# Patient Record
Sex: Male | Born: 1935
Health system: Southern US, Community
[De-identification: ages and names within clinical notes are randomized; demographics above are authoritative.]

## PROBLEM LIST (undated history)

## (undated) DIAGNOSIS — I10 Essential (primary) hypertension: Secondary | ICD-10-CM

## (undated) DIAGNOSIS — H9190 Unspecified hearing loss, unspecified ear: Secondary | ICD-10-CM

## (undated) DIAGNOSIS — H269 Unspecified cataract: Secondary | ICD-10-CM

## (undated) DIAGNOSIS — K8020 Calculus of gallbladder without cholecystitis without obstruction: Secondary | ICD-10-CM

## (undated) DIAGNOSIS — J329 Chronic sinusitis, unspecified: Secondary | ICD-10-CM

## (undated) DIAGNOSIS — J449 Chronic obstructive pulmonary disease, unspecified: Secondary | ICD-10-CM

## (undated) DIAGNOSIS — N400 Enlarged prostate without lower urinary tract symptoms: Secondary | ICD-10-CM

## (undated) DIAGNOSIS — N183 Chronic kidney disease, stage 3 unspecified: Secondary | ICD-10-CM

## (undated) DIAGNOSIS — J45909 Unspecified asthma, uncomplicated: Secondary | ICD-10-CM

## (undated) DIAGNOSIS — H3530 Unspecified macular degeneration: Secondary | ICD-10-CM

## (undated) DIAGNOSIS — E7800 Pure hypercholesterolemia, unspecified: Secondary | ICD-10-CM

## (undated) DIAGNOSIS — I2510 Atherosclerotic heart disease of native coronary artery without angina pectoris: Secondary | ICD-10-CM

## (undated) HISTORY — PX: TRANSURETHRAL RESECTION OF PROSTATE: SHX73

## (undated) HISTORY — DX: Calculus of gallbladder without cholecystitis without obstruction: K80.20

## (undated) HISTORY — DX: Chronic kidney disease, stage 3 unspecified: N18.30

## (undated) HISTORY — DX: Unspecified asthma, uncomplicated: J45.909

## (undated) HISTORY — PX: APPENDECTOMY: SHX54

## (undated) HISTORY — DX: Chronic kidney disease, stage 3 (moderate): N18.3

## (undated) HISTORY — DX: Benign prostatic hyperplasia without lower urinary tract symptoms: N40.0

## (undated) HISTORY — DX: Unspecified macular degeneration: H35.30

## (undated) HISTORY — DX: Unspecified hearing loss, unspecified ear: H91.90

## (undated) HISTORY — DX: Unspecified cataract: H26.9

## (undated) HISTORY — DX: Chronic sinusitis, unspecified: J32.9

---

## 1996-08-13 HISTORY — PX: CORONARY ANGIOPLASTY WITH STENT PLACEMENT: SHX49

## 2000-02-26 ENCOUNTER — Encounter: Payer: Self-pay | Admitting: Emergency Medicine

## 2000-02-27 ENCOUNTER — Inpatient Hospital Stay (HOSPITAL_COMMUNITY): Admission: EM | Admit: 2000-02-27 | Discharge: 2000-02-29 | Payer: Self-pay | Admitting: Emergency Medicine

## 2001-01-08 ENCOUNTER — Inpatient Hospital Stay (HOSPITAL_COMMUNITY): Admission: EM | Admit: 2001-01-08 | Discharge: 2001-01-10 | Payer: Self-pay | Admitting: Emergency Medicine

## 2001-01-08 ENCOUNTER — Encounter: Payer: Self-pay | Admitting: Emergency Medicine

## 2001-04-14 ENCOUNTER — Emergency Department (HOSPITAL_COMMUNITY): Admission: EM | Admit: 2001-04-14 | Discharge: 2001-04-14 | Payer: Self-pay | Admitting: Emergency Medicine

## 2001-04-14 ENCOUNTER — Encounter: Payer: Self-pay | Admitting: Emergency Medicine

## 2001-06-01 ENCOUNTER — Inpatient Hospital Stay (HOSPITAL_COMMUNITY): Admission: EM | Admit: 2001-06-01 | Discharge: 2001-06-10 | Payer: Self-pay

## 2001-06-01 ENCOUNTER — Encounter: Payer: Self-pay | Admitting: *Deleted

## 2001-06-02 ENCOUNTER — Encounter: Payer: Self-pay | Admitting: Pulmonary Disease

## 2001-06-03 ENCOUNTER — Encounter: Payer: Self-pay | Admitting: Pulmonary Disease

## 2001-06-04 ENCOUNTER — Encounter: Payer: Self-pay | Admitting: Pulmonary Disease

## 2001-06-05 ENCOUNTER — Encounter: Payer: Self-pay | Admitting: Pulmonary Disease

## 2001-06-05 ENCOUNTER — Encounter: Payer: Self-pay | Admitting: Internal Medicine

## 2001-06-06 ENCOUNTER — Encounter: Payer: Self-pay | Admitting: Internal Medicine

## 2001-06-07 ENCOUNTER — Encounter: Payer: Self-pay | Admitting: Internal Medicine

## 2001-06-08 ENCOUNTER — Encounter: Payer: Self-pay | Admitting: Internal Medicine

## 2001-06-09 ENCOUNTER — Encounter: Payer: Self-pay | Admitting: Gastroenterology

## 2002-07-23 ENCOUNTER — Encounter: Payer: Self-pay | Admitting: Emergency Medicine

## 2002-07-23 ENCOUNTER — Inpatient Hospital Stay (HOSPITAL_COMMUNITY): Admission: EM | Admit: 2002-07-23 | Discharge: 2002-07-25 | Payer: Self-pay

## 2003-07-10 ENCOUNTER — Emergency Department (HOSPITAL_COMMUNITY): Admission: EM | Admit: 2003-07-10 | Discharge: 2003-07-10 | Payer: Self-pay | Admitting: *Deleted

## 2003-11-02 ENCOUNTER — Other Ambulatory Visit: Payer: Self-pay

## 2007-09-25 ENCOUNTER — Other Ambulatory Visit: Payer: Self-pay

## 2007-09-25 ENCOUNTER — Emergency Department: Payer: Self-pay | Admitting: Emergency Medicine

## 2011-10-09 ENCOUNTER — Encounter (INDEPENDENT_AMBULATORY_CARE_PROVIDER_SITE_OTHER): Payer: Self-pay | Admitting: Ophthalmology

## 2011-11-23 ENCOUNTER — Encounter (INDEPENDENT_AMBULATORY_CARE_PROVIDER_SITE_OTHER): Payer: PRIVATE HEALTH INSURANCE | Admitting: Ophthalmology

## 2011-11-23 DIAGNOSIS — H3530 Unspecified macular degeneration: Secondary | ICD-10-CM

## 2011-11-23 DIAGNOSIS — H35039 Hypertensive retinopathy, unspecified eye: Secondary | ICD-10-CM

## 2011-11-23 DIAGNOSIS — H2510 Age-related nuclear cataract, unspecified eye: Secondary | ICD-10-CM

## 2011-11-23 DIAGNOSIS — I10 Essential (primary) hypertension: Secondary | ICD-10-CM

## 2011-11-23 DIAGNOSIS — H43819 Vitreous degeneration, unspecified eye: Secondary | ICD-10-CM

## 2011-11-23 DIAGNOSIS — H26499 Other secondary cataract, unspecified eye: Secondary | ICD-10-CM

## 2011-11-26 ENCOUNTER — Encounter (INDEPENDENT_AMBULATORY_CARE_PROVIDER_SITE_OTHER): Payer: PRIVATE HEALTH INSURANCE | Admitting: Ophthalmology

## 2011-11-26 DIAGNOSIS — H3530 Unspecified macular degeneration: Secondary | ICD-10-CM

## 2011-11-26 DIAGNOSIS — H353290 Exudative age-related macular degeneration, unspecified eye, stage unspecified: Secondary | ICD-10-CM

## 2011-11-26 DIAGNOSIS — H43819 Vitreous degeneration, unspecified eye: Secondary | ICD-10-CM

## 2011-11-29 ENCOUNTER — Encounter (INDEPENDENT_AMBULATORY_CARE_PROVIDER_SITE_OTHER): Payer: PRIVATE HEALTH INSURANCE | Admitting: Ophthalmology

## 2011-11-29 DIAGNOSIS — I10 Essential (primary) hypertension: Secondary | ICD-10-CM

## 2011-11-29 DIAGNOSIS — H35039 Hypertensive retinopathy, unspecified eye: Secondary | ICD-10-CM

## 2011-11-29 DIAGNOSIS — H43819 Vitreous degeneration, unspecified eye: Secondary | ICD-10-CM

## 2011-11-29 DIAGNOSIS — H3530 Unspecified macular degeneration: Secondary | ICD-10-CM

## 2011-11-29 DIAGNOSIS — H353290 Exudative age-related macular degeneration, unspecified eye, stage unspecified: Secondary | ICD-10-CM

## 2011-12-24 ENCOUNTER — Encounter (INDEPENDENT_AMBULATORY_CARE_PROVIDER_SITE_OTHER): Payer: PRIVATE HEALTH INSURANCE | Admitting: Ophthalmology

## 2011-12-24 DIAGNOSIS — H2510 Age-related nuclear cataract, unspecified eye: Secondary | ICD-10-CM

## 2011-12-24 DIAGNOSIS — H353290 Exudative age-related macular degeneration, unspecified eye, stage unspecified: Secondary | ICD-10-CM

## 2011-12-24 DIAGNOSIS — H3530 Unspecified macular degeneration: Secondary | ICD-10-CM

## 2011-12-24 DIAGNOSIS — H43819 Vitreous degeneration, unspecified eye: Secondary | ICD-10-CM

## 2011-12-24 DIAGNOSIS — I10 Essential (primary) hypertension: Secondary | ICD-10-CM

## 2011-12-24 DIAGNOSIS — H35039 Hypertensive retinopathy, unspecified eye: Secondary | ICD-10-CM

## 2012-01-21 ENCOUNTER — Encounter (INDEPENDENT_AMBULATORY_CARE_PROVIDER_SITE_OTHER): Payer: PRIVATE HEALTH INSURANCE | Admitting: Ophthalmology

## 2012-01-21 DIAGNOSIS — H35039 Hypertensive retinopathy, unspecified eye: Secondary | ICD-10-CM

## 2012-01-21 DIAGNOSIS — H43819 Vitreous degeneration, unspecified eye: Secondary | ICD-10-CM

## 2012-01-21 DIAGNOSIS — H3530 Unspecified macular degeneration: Secondary | ICD-10-CM

## 2012-01-21 DIAGNOSIS — H2510 Age-related nuclear cataract, unspecified eye: Secondary | ICD-10-CM

## 2012-01-21 DIAGNOSIS — H353290 Exudative age-related macular degeneration, unspecified eye, stage unspecified: Secondary | ICD-10-CM

## 2012-01-21 DIAGNOSIS — I10 Essential (primary) hypertension: Secondary | ICD-10-CM

## 2012-02-25 ENCOUNTER — Encounter (INDEPENDENT_AMBULATORY_CARE_PROVIDER_SITE_OTHER): Payer: PRIVATE HEALTH INSURANCE | Admitting: Ophthalmology

## 2012-02-25 DIAGNOSIS — I10 Essential (primary) hypertension: Secondary | ICD-10-CM

## 2012-02-25 DIAGNOSIS — H43819 Vitreous degeneration, unspecified eye: Secondary | ICD-10-CM

## 2012-02-25 DIAGNOSIS — H2510 Age-related nuclear cataract, unspecified eye: Secondary | ICD-10-CM

## 2012-02-25 DIAGNOSIS — H3530 Unspecified macular degeneration: Secondary | ICD-10-CM

## 2012-02-25 DIAGNOSIS — H35039 Hypertensive retinopathy, unspecified eye: Secondary | ICD-10-CM

## 2012-02-25 DIAGNOSIS — H353290 Exudative age-related macular degeneration, unspecified eye, stage unspecified: Secondary | ICD-10-CM

## 2012-03-08 ENCOUNTER — Encounter (HOSPITAL_COMMUNITY): Payer: Self-pay | Admitting: *Deleted

## 2012-03-08 ENCOUNTER — Emergency Department (HOSPITAL_COMMUNITY): Payer: PRIVATE HEALTH INSURANCE

## 2012-03-08 ENCOUNTER — Emergency Department (HOSPITAL_COMMUNITY)
Admission: EM | Admit: 2012-03-08 | Discharge: 2012-03-08 | Disposition: A | Discharge status: Home / Self Care | Payer: PRIVATE HEALTH INSURANCE | Attending: Emergency Medicine | Admitting: Emergency Medicine

## 2012-03-08 DIAGNOSIS — I2510 Atherosclerotic heart disease of native coronary artery without angina pectoris: Secondary | ICD-10-CM | POA: Insufficient documentation

## 2012-03-08 DIAGNOSIS — R739 Hyperglycemia, unspecified: Secondary | ICD-10-CM

## 2012-03-08 DIAGNOSIS — J4489 Other specified chronic obstructive pulmonary disease: Secondary | ICD-10-CM | POA: Insufficient documentation

## 2012-03-08 DIAGNOSIS — I10 Essential (primary) hypertension: Secondary | ICD-10-CM | POA: Insufficient documentation

## 2012-03-08 DIAGNOSIS — D72829 Elevated white blood cell count, unspecified: Secondary | ICD-10-CM | POA: Insufficient documentation

## 2012-03-08 DIAGNOSIS — R7309 Other abnormal glucose: Secondary | ICD-10-CM | POA: Insufficient documentation

## 2012-03-08 DIAGNOSIS — R109 Unspecified abdominal pain: Secondary | ICD-10-CM

## 2012-03-08 DIAGNOSIS — J449 Chronic obstructive pulmonary disease, unspecified: Secondary | ICD-10-CM | POA: Insufficient documentation

## 2012-03-08 HISTORY — DX: Essential (primary) hypertension: I10

## 2012-03-08 HISTORY — DX: Atherosclerotic heart disease of native coronary artery without angina pectoris: I25.10

## 2012-03-08 HISTORY — DX: Chronic obstructive pulmonary disease, unspecified: J44.9

## 2012-03-08 HISTORY — DX: Pure hypercholesterolemia, unspecified: E78.00

## 2012-03-08 LAB — BASIC METABOLIC PANEL
Calcium: 9.6 mg/dL (ref 8.4–10.5)
GFR calc Af Amer: >90 mL/min (ref >90)
GFR calc non Af Amer: 80 mL/min — ABNORMAL LOW (ref >90)
Glucose, Bld: 149 mg/dL — ABNORMAL HIGH (ref 70–99)
Potassium: 3.6 mEq/L (ref 3.5–5.1)
Sodium: 136 mEq/L (ref 135–145)

## 2012-03-08 LAB — CBC
Hemoglobin: 12.1 g/dL — ABNORMAL LOW (ref 13.0–17.0)
MCH: 29.1 pg (ref 26.0–34.0)
MCHC: 33.7 g/dL (ref 30.0–36.0)
Platelets: 197 10*3/uL (ref 150–400)

## 2012-03-08 LAB — URINALYSIS, ROUTINE W REFLEX MICROSCOPIC
Bilirubin Urine: NEGATIVE
Ketones, ur: NEGATIVE mg/dL
Leukocytes, UA: NEGATIVE
Nitrite: NEGATIVE
Protein, ur: NEGATIVE mg/dL
Urobilinogen, UA: 0.2 mg/dL (ref 0.0–1.0)
pH: 5.5 (ref 5.0–8.0)

## 2012-03-08 LAB — POCT I-STAT TROPONIN I: Troponin i, poc: 0.03 ng/mL (ref 0.00–0.08)

## 2012-03-08 LAB — HEPATIC FUNCTION PANEL
AST: 23 U/L (ref 0–37)
Bilirubin, Direct: 0.1 mg/dL (ref 0.0–0.3)
Indirect Bilirubin: 0.2 mg/dL — ABNORMAL LOW (ref 0.3–0.9)
Total Bilirubin: 0.3 mg/dL (ref 0.3–1.2)

## 2012-03-08 MED ORDER — ONDANSETRON HCL 4 MG/2ML IJ SOLN
4.0000 mg | Freq: Once | INTRAMUSCULAR | Status: AC
  Administered 2012-03-08: 4 mg via INTRAVENOUS
  Filled 2012-03-08: qty 2

## 2012-03-08 MED ORDER — PANTOPRAZOLE SODIUM 20 MG PO TBEC: 20.0000 mg | DELAYED_RELEASE_TABLET | Freq: Every day | ORAL | Status: DC

## 2012-03-08 MED ORDER — OXYCODONE-ACETAMINOPHEN 5-325 MG PO TABS: 2.0000 | ORAL_TABLET | ORAL | Status: AC | PRN

## 2012-03-08 MED ORDER — NITROGLYCERIN IN D5W 200-5 MCG/ML-% IV SOLN: 5.0000 ug/min | INTRAVENOUS | Status: DC

## 2012-03-08 MED ORDER — NITROGLYCERIN 0.4 MG SL SUBL
0.4000 mg | SUBLINGUAL_TABLET | SUBLINGUAL | Status: DC | PRN
  Administered 2012-03-08: 0.4 mg via SUBLINGUAL

## 2012-03-08 MED ORDER — ASPIRIN 81 MG PO CHEW
324.0000 mg | CHEWABLE_TABLET | Freq: Once | ORAL | Status: AC
  Administered 2012-03-08: 324 mg via ORAL
  Filled 2012-03-08: qty 4

## 2012-03-08 MED ORDER — MORPHINE SULFATE 4 MG/ML IJ SOLN
4.0000 mg | Freq: Once | INTRAMUSCULAR | Status: AC
  Administered 2012-03-08: 4 mg via INTRAVENOUS
  Filled 2012-03-08: qty 1

## 2012-03-08 MED ORDER — GI COCKTAIL ~~LOC~~
30.0000 mL | Freq: Once | ORAL | Status: AC
  Administered 2012-03-08: 30 mL via ORAL
  Filled 2012-03-08: qty 30

## 2012-03-08 MED ORDER — NITROGLYCERIN 0.4 MG SL SUBL
SUBLINGUAL_TABLET | SUBLINGUAL | Status: AC
  Administered 2012-03-08: 13:00:00
  Filled 2012-03-08: qty 75

## 2012-03-08 NOTE — ED Provider Notes (Signed)
History     CSN: [id number]  Arrival date & time 03/08/12  1126   First MD Initiated Contact with Patient 03/08/12 1253      Chief Complaint  Patient presents with  . Chest Pain    (Consider location/radiation/quality/duration/timing/severity/associated sxs/prior treatment) HPI  78 year old male is with history of CAD, COPD, and hypertension presents complaining of chest pain. Patient report having midsternal chest pain which started 6:00 this morning. Pain is described as a sharp pressure sensation radiates to his left arm. He did experience shortness of breath and nausea without vomiting.  Also endorse diaphoresis.  Pain has worsening in the past hours.  Denies fever, chills, productive cough, back pain, or urinary sxs.  Denies recent trauma.  Has cardiac stent in 1997.  Was a former smoker.  Does take prilosec for heart burn but think this pain is different.  Sts he felt normal last night, but pain wakes him up this AM.  Pt still has intact gallbladder.    Past Medical History  Diagnosis Date  . Coronary artery disease   . COPD (chronic obstructive pulmonary disease)   . Hypertension   . High cholesterol     Past Surgical History  Procedure Date  . Coronary angioplasty with stent placement     History reviewed. No pertinent family history.  History  Substance Use Topics  . Smoking status: Former Games developer  . Smokeless tobacco: Not on file  . Alcohol Use: No      Review of Systems  All other systems reviewed and are negative.    Allergies  Review of patient's allergies indicates no known allergies.  Home Medications  No current outpatient prescriptions on file.  BP 135/57  Pulse 64  Temp 97.9 F (36.6 C) (Oral)  Resp 20  SpO2 95%  Physical Exam  Nursing note and vitals reviewed. Constitutional: He appears well-developed and well-nourished.       Awake, alert, uncomfortable appearing, diaphroretic  HENT:  Head: Atraumatic.  Eyes: Conjunctivae are  normal. Right eye exhibits no discharge. Left eye exhibits no discharge.  Neck: Normal range of motion. Neck supple.  Cardiovascular: Normal rate and regular rhythm.   Pulmonary/Chest: Effort normal. No respiratory distress. He exhibits no tenderness.  Abdominal: Soft. There is tenderness in the epigastric area. There is no rigidity, no rebound, no guarding, no CVA tenderness, no tenderness at McBurney's point and negative Murphy's sign.    Musculoskeletal: He exhibits no edema and no tenderness.       ROM appears intact, no obvious focal weakness  Neurological: He is alert.  Skin: Skin is warm and dry. No rash noted.  Psychiatric: He has a normal mood and affect.    ED Course  Procedures (including critical care time)  Labs Reviewed  CBC - Abnormal; Notable for the following:    WBC 12.2 (*)     RBC 4.16 (*)     Hemoglobin 12.1 (*)     HCT 35.9 (*)     All other components within normal limits  BASIC METABOLIC PANEL - Abnormal; Notable for the following:    Glucose, Bld 149 (*)     GFR calc non Af Amer 80 (*)     All other components within normal limits  POCT I-STAT TROPONIN I   Dg Chest 2 View  03/08/2012  *RADIOLOGY REPORT*  Clinical Data: Short of breath.  Left-sided chest pain.  COPD.  CHEST - 2 VIEW  Comparison: None.  Findings: Pulmonary hyperinflation seen, consistent  with COPD. Both lungs are clear.  No evidence of pleural effusion.  Heart size is within normal limits.  No mass or lymphadenopathy identified.  IMPRESSION: COPD.  No active disease.  Original Report Authenticated By: Danae Orleans, M.D.     No diagnosis found.   Date: 03/08/2012  Rate: 66  Rhythm: normal sinus rhythm  QRS Axis: normal  Intervals: PR prolonged  ST/T Wave abnormalities: nonspecific ST/T changes, consider inferior ischemia  Conduction Disutrbances:first-degree A-V block   Narrative Interpretation: Twave changes  Old EKG Reviewed: changes noted    Results for orders placed during  the hospital encounter of 03/08/12  CBC      Component Value Range   WBC 12.2 (*) 4.0 - 10.5 K/uL   RBC 4.16 (*) 4.22 - 5.81 MIL/uL   Hemoglobin 12.1 (*) 13.0 - 17.0 g/dL   HCT 16.1 (*) 09.6 - 04.5 %   MCV 86.3  78.0 - 100.0 fL   MCH 29.1  26.0 - 34.0 pg   MCHC 33.7  30.0 - 36.0 g/dL   RDW 40.9  81.1 - 91.4 %   Platelets 197  150 - 400 K/uL  BASIC METABOLIC PANEL      Component Value Range   Sodium 136  135 - 145 mEq/L   Potassium 3.6  3.5 - 5.1 mEq/L   Chloride 97  96 - 112 mEq/L   CO2 28  19 - 32 mEq/L   Glucose, Bld 149 (*) 70 - 99 mg/dL   BUN 14  6 - 23 mg/dL   Creatinine, Ser 7.82  0.50 - 1.35 mg/dL   Calcium 9.6  8.4 - 95.6 mg/dL   GFR calc non Af Amer 80 (*) >90 mL/min   GFR calc Af Amer >90  >90 mL/min  POCT I-STAT TROPONIN I      Component Value Range   Troponin i, poc 0.03  0.00 - 0.08 ng/mL   Comment 3           URINALYSIS, ROUTINE W REFLEX MICROSCOPIC      Component Value Range   Color, Urine YELLOW  YELLOW   APPearance CLOUDY (*) CLEAR   Specific Gravity, Urine 1.016  1.005 - 1.030   pH 5.5  5.0 - 8.0   Glucose, UA NEGATIVE  NEGATIVE mg/dL   Hgb urine dipstick NEGATIVE  NEGATIVE   Bilirubin Urine NEGATIVE  NEGATIVE   Ketones, ur NEGATIVE  NEGATIVE mg/dL   Protein, ur NEGATIVE  NEGATIVE mg/dL   Urobilinogen, UA 0.2  0.0 - 1.0 mg/dL   Nitrite NEGATIVE  NEGATIVE   Leukocytes, UA NEGATIVE  NEGATIVE  HEPATIC FUNCTION PANEL      Component Value Range   Total Protein 7.3  6.0 - 8.3 g/dL   Albumin 3.7  3.5 - 5.2 g/dL   AST 23  0 - 37 U/L   ALT 18  0 - 53 U/L   Alkaline Phosphatase 64  39 - 117 U/L   Total Bilirubin 0.3  0.3 - 1.2 mg/dL   Bilirubin, Direct 0.1  0.0 - 0.3 mg/dL   Indirect Bilirubin 0.2 (*) 0.3 - 0.9 mg/dL  LIPASE, BLOOD      Component Value Range   Lipase 21  11 - 59 U/L  POCT I-STAT TROPONIN I      Component Value Range   Troponin i, poc 0.00  0.00 - 0.08 ng/mL   Comment 3            Dg  Chest 2 View  03/08/2012  *RADIOLOGY REPORT*   Clinical Data: Short of breath.  Left-sided chest pain.  COPD.  CHEST - 2 VIEW  Comparison: None.  Findings: Pulmonary hyperinflation seen, consistent with COPD. Both lungs are clear.  No evidence of pleural effusion.  Heart size is within normal limits.  No mass or lymphadenopathy identified.  IMPRESSION: COPD.  No active disease.  Original Report Authenticated By: Danae Orleans, M.D.   US Abdomen Complete  03/08/2012  *RADIOLOGY REPORT*  Clinical Data:  Abdominal pain  COMPLETE ABDOMINAL ULTRASOUND  Comparison:  None.  Findings:  Gallbladder:  No gallstones, gallbladder wall thickening, or pericholecystic fluid. Limited visualization of gallbladder due to bowel gas. No sonographic Murphy's sign.  Common bile duct:  Measures 5 mm in diameter within normal limits.  Liver:  No focal lesion identified.  Within normal limits in parenchymal echogenicity.  IVC:  Appears normal.  Pancreas:  Heterogeneous without focal abnormality.  Spleen:  Measures 6.2 cm in length with normal echogenicity.  Right Kidney:  Measures 10.8 cm in length.  No hydronephrosis or diagnostic renal calculus  Left Kidney:  Measures measures 12 cm in length.  No hydronephrosis or diagnostic renal calculus.  A cyst in upper pole measures 2.2 x 1.9 cm.  Abdominal aorta:  No aneurysm identified. Visualized proximal aorta measures 2.7 cm in diameter.  Mid and distal aorta are not visualized  IMPRESSION:  1.  No gallstones are noted within gallbladder.  No sonographic Murphy's sign.  The limited visualization of bladder due to bowel gas. 2.  Normal CBD. 3.  No hydronephrosis or diagnostic renal calculus.  Cyst in upper pole of the left kidney measures 2.2 cm.  Original Report Authenticated By: Natasha Mead, M.D.      MDM  Active chest pain.  Hx of CAD, cardiac stenting in 97, unsure last stress test.  Pain not improved with SL nitro.  Is hypotensive.  IVF given. MONA.   Work up initiated.  Discussed with my attending.    2:12 PM Pt feels much  better after treatment.  Still having some residual chest discomfort but otherwise feeling better.   Pt does have intact gallbladder, has reproducible epigastric tenderness.  Will check lipase, hepatic function panel and obtain Abd [place] to r/o gallbladder etiology   3:15 PM Report given to Dr. Weldon Inches at end of shift.  Pt to have abd [place], belly labs.  If negative, will plan to admit for chest pain rule out.     Fayrene Helper, PA-C 03/09/12 [phone]

## 2012-03-08 NOTE — ED Notes (Signed)
Patient is being discharged and complaining of pain level 1, in the abdomen.  Patient was explained the discharge instructions in front of his wife.  No questions asked.  Vitals are recorded.

## 2012-03-08 NOTE — ED Notes (Signed)
Leon Estes wife [phone], n  [phone] Amy daughter

## 2012-03-08 NOTE — ED Provider Notes (Signed)
History     CSN: [id number]  Arrival date & time 03/08/12  1126   First MD Initiated Contact with Patient 03/08/12 1253      Chief Complaint  Patient presents with  . Chest Pain    (Consider location/radiation/quality/duration/timing/severity/associated sxs/prior treatment) HPI  Past Medical History  Diagnosis Date  . Coronary artery disease   . COPD (chronic obstructive pulmonary disease)   . Hypertension   . High cholesterol     Past Surgical History  Procedure Date  . Coronary angioplasty with stent placement     History reviewed. No pertinent family history.  History  Substance Use Topics  . Smoking status: Former Games developer  . Smokeless tobacco: Not on file  . Alcohol Use: No      Review of Systems  Allergies  Review of patient's allergies indicates no known allergies.  Home Medications   Current Outpatient Rx  Name Route Sig Dispense Refill  . ASPIRIN EC 81 MG PO TBEC Oral Take 81 mg by mouth daily.    Marland Kitchen FLUTICASONE-SALMETEROL 500-50 MCG/DOSE IN AEPB Inhalation Inhale 1 puff into the lungs every 12 (twelve) hours.    Marland Kitchen LISINOPRIL-HYDROCHLOROTHIAZIDE 20-25 MG PO TABS Oral Take 1 tablet by mouth daily.    Marland Kitchen SIMVASTATIN 40 MG PO TABS Oral Take 40 mg by mouth every evening.      BP 128/57  Pulse 63  Temp 97.9 F (36.6 C) (Oral)  Resp 11  SpO2 95%  Physical Exam  ED Course  Procedures (including critical care time)  Labs Reviewed  CBC - Abnormal; Notable for the following:    WBC 12.2 (*)     RBC 4.16 (*)     Hemoglobin 12.1 (*)     HCT 35.9 (*)     All other components within normal limits  BASIC METABOLIC PANEL - Abnormal; Notable for the following:    Glucose, Bld 149 (*)     GFR calc non Af Amer 80 (*)     All other components within normal limits  POCT I-STAT TROPONIN I  URINALYSIS, ROUTINE W REFLEX MICROSCOPIC   Dg Chest 2 View  03/08/2012  *RADIOLOGY REPORT*  Clinical Data: Short of breath.  Left-sided chest pain.  COPD.  CHEST -  2 VIEW  Comparison: None.  Findings: Pulmonary hyperinflation seen, consistent with COPD. Both lungs are clear.  No evidence of pleural effusion.  Heart size is within normal limits.  No mass or lymphadenopathy identified.  IMPRESSION: COPD.  No active disease.  Original Report Authenticated By: Danae Orleans, M.D.     No diagnosis found.    MDM  Pt is being actively followed by PA. Laveda Norman.  I evaluated Mr. Prabhu at bedside.  Although he reports having chest pain, he points to his epigastrium when describing the discomfort.  He has easily reproduced epigastric pain on exam.  Plan currently is to broaden his evaluation to include not only cardiac markers but also routine belly labs and imaging.        Tobin Chad, MD 03/08/12 1427

## 2012-03-08 NOTE — ED Notes (Signed)
IV bolus started for low BP

## 2012-03-08 NOTE — ED Notes (Signed)
Reports onset at 0600 of mid chest pain, radiating to left arm. Having mild sob and nausea. ekg done at triage. Airway intact.

## 2012-03-08 NOTE — ED Provider Notes (Addendum)
Assumed care of pt from Dr Lorenso Courier and PA Laveda Norman.   Agree with eval. Reviewed labs, [place] and reevaluated pt. Still in pain since around 5 am.  No evidence acs, gb dz or pancreatitis.  Suspect ulcer/gastritis which pt has hx of.  Will repeat gi cocktail and morphine.  No acute abdomen.  Cheri Guppy, MD 03/08/12 1805  Pain improved to 2/10.  Will release   Cheri Guppy, MD 03/08/12 [phone]

## 2012-04-07 ENCOUNTER — Encounter (INDEPENDENT_AMBULATORY_CARE_PROVIDER_SITE_OTHER): Payer: PRIVATE HEALTH INSURANCE | Admitting: Ophthalmology

## 2012-04-07 DIAGNOSIS — H353290 Exudative age-related macular degeneration, unspecified eye, stage unspecified: Secondary | ICD-10-CM

## 2012-04-07 DIAGNOSIS — I10 Essential (primary) hypertension: Secondary | ICD-10-CM

## 2012-04-07 DIAGNOSIS — H43819 Vitreous degeneration, unspecified eye: Secondary | ICD-10-CM

## 2012-04-07 DIAGNOSIS — H3530 Unspecified macular degeneration: Secondary | ICD-10-CM

## 2012-04-07 DIAGNOSIS — H35039 Hypertensive retinopathy, unspecified eye: Secondary | ICD-10-CM

## 2012-05-19 ENCOUNTER — Encounter (INDEPENDENT_AMBULATORY_CARE_PROVIDER_SITE_OTHER): Payer: PRIVATE HEALTH INSURANCE | Admitting: Ophthalmology

## 2012-05-19 DIAGNOSIS — I10 Essential (primary) hypertension: Secondary | ICD-10-CM

## 2012-05-19 DIAGNOSIS — H3530 Unspecified macular degeneration: Secondary | ICD-10-CM

## 2012-05-19 DIAGNOSIS — H35039 Hypertensive retinopathy, unspecified eye: Secondary | ICD-10-CM

## 2012-05-19 DIAGNOSIS — H353290 Exudative age-related macular degeneration, unspecified eye, stage unspecified: Secondary | ICD-10-CM

## 2012-05-19 DIAGNOSIS — H43819 Vitreous degeneration, unspecified eye: Secondary | ICD-10-CM

## 2012-05-19 DIAGNOSIS — H2510 Age-related nuclear cataract, unspecified eye: Secondary | ICD-10-CM

## 2012-06-30 ENCOUNTER — Encounter (INDEPENDENT_AMBULATORY_CARE_PROVIDER_SITE_OTHER): Payer: PRIVATE HEALTH INSURANCE | Admitting: Ophthalmology

## 2012-06-30 DIAGNOSIS — I10 Essential (primary) hypertension: Secondary | ICD-10-CM

## 2012-06-30 DIAGNOSIS — H3530 Unspecified macular degeneration: Secondary | ICD-10-CM

## 2012-06-30 DIAGNOSIS — H35039 Hypertensive retinopathy, unspecified eye: Secondary | ICD-10-CM

## 2012-06-30 DIAGNOSIS — H43819 Vitreous degeneration, unspecified eye: Secondary | ICD-10-CM

## 2012-06-30 DIAGNOSIS — H353290 Exudative age-related macular degeneration, unspecified eye, stage unspecified: Secondary | ICD-10-CM

## 2012-07-22 ENCOUNTER — Encounter: Payer: Self-pay | Admitting: Gastroenterology

## 2012-08-05 ENCOUNTER — Ambulatory Visit (INDEPENDENT_AMBULATORY_CARE_PROVIDER_SITE_OTHER): Payer: PRIVATE HEALTH INSURANCE | Admitting: Gastroenterology

## 2012-08-05 ENCOUNTER — Other Ambulatory Visit (INDEPENDENT_AMBULATORY_CARE_PROVIDER_SITE_OTHER): Payer: PRIVATE HEALTH INSURANCE

## 2012-08-05 ENCOUNTER — Encounter: Payer: Self-pay | Admitting: Gastroenterology

## 2012-08-05 VITALS — BP 106/52 | HR 76 | Ht 66.5 in | Wt 159.4 lb

## 2012-08-05 DIAGNOSIS — R1013 Epigastric pain: Secondary | ICD-10-CM

## 2012-08-05 LAB — COMPREHENSIVE METABOLIC PANEL
ALT: 19 U/L (ref 0–53)
CO2: 28 mEq/L (ref 19–32)
Creatinine, Ser: 1.1 mg/dL (ref 0.4–1.5)
GFR: 66.47 mL/min (ref >60.00)
Total Bilirubin: 0.4 mg/dL (ref 0.3–1.2)

## 2012-08-05 LAB — CBC WITH DIFFERENTIAL/PLATELET
Basophils Absolute: 0.1 10*3/uL (ref 0.0–0.1)
Eosinophils Absolute: 0.2 10*3/uL (ref 0.0–0.7)
HCT: 34.7 % — ABNORMAL LOW (ref 39.0–52.0)
Hemoglobin: 11.6 g/dL — ABNORMAL LOW (ref 13.0–17.0)
Lymphs Abs: 2.5 10*3/uL (ref 0.7–4.0)
MCHC: 33.6 g/dL (ref 30.0–36.0)
Monocytes Relative: 12.4 % — ABNORMAL HIGH (ref 3.0–12.0)
Neutro Abs: 5.2 10*3/uL (ref 1.4–7.7)
RDW: 14.4 % (ref 11.5–14.6)

## 2012-08-05 LAB — CREATININE, SERUM: Creatinine, Ser: 1.1 mg/dL (ref 0.4–1.5)

## 2012-08-05 NOTE — Patient Instructions (Addendum)
You will be set up for a CT scan of abdomen and pelvis with IV and oral contrast.  You have been scheduled for a CT scan of the abdomen and pelvis at [facility] ([address]---this is in the same building as Architectural technologist).   You are scheduled on 08/07/12  at 1 PM. You should arrive 15 minutes prior to your appointment time for registration. Please follow the written instructions below on the day of your exam:  WARNING: IF YOU ARE ALLERGIC TO IODINE/X-RAY DYE, PLEASE NOTIFY RADIOLOGY IMMEDIATELY AT [phone]! YOU WILL BE GIVEN A 13 HOUR PREMEDICATION PREP.  1) Do not eat or drink anything after 9 AM (4 hours prior to your test) 2) You have been given 2 bottles of oral contrast to drink. The solution may taste better if refrigerated, but do NOT add ice or any other liquid to this solution. Shake well before drinking.    Drink 1 bottle of contrast @ 11 AM (2 hours prior to your exam)  Drink 1 bottle of contrast @ 12 NOON  (1 hour prior to your exam)  You may take any medications as prescribed with a small amount of water except for the following: Metformin, Glucophage, Glucovance, Avandamet, Riomet, Fortamet, Actoplus Met, Janumet, Glumetza or Metaglip. The above medications must be held the day of the exam AND 48 hours after the exam.  The purpose of you drinking the oral contrast is to aid in the visualization of your intestinal tract. The contrast solution may cause some diarrhea. Before your exam is started, you will be given a small amount of fluid to drink. Depending on your individual set of symptoms, you may also receive an intravenous injection of x-ray contrast/dye. Plan on being at [facility] for 30 minutes or long, depending on the type of exam you are having performed.  If you have any questions regarding your exam or if you need to reschedule, you may call the CT department at [phone] between the hours of 8:00 am and 5:00 pm,  Monday-Friday.  ________________________________________________________________________  Leon Estes will have labs checked today in the basement lab.  Please head down after you check out with the front desk  (cbc, cmet). You may need upper endoscopy depending on the test results above. Call here if the pain returns.

## 2012-08-05 NOTE — Progress Notes (Signed)
HPI: This is a     very pleasant 78 year old man whom I am meeting for the first time.   [place] July 2013 was essentially normal.  Had pains, went to ER.  Underwent labs, ekg, imaging.    The pain was a "dull hurt" epigastrium, come on suddenly. NO associated nausea or vomiting.    No real heartburn.   Was fine for several months until about 2 weeks ago.  Similar episode for several hours.  Eventually was seen by Dr. Toni Arthurs.  Was seen by Dr. Toni Arthurs 2-3 weeks ago, given some type of medicine (pain pill and a shot and a liquid). We have no records from that visit.  No NSAIDs. Only tylenol for pains  Overall his weight has been stable.  appy many years ago, no other abd surgeries.     Review of systems: Pertinent positive and negative review of systems were noted in the above HPI section. Complete review of systems was performed and was otherwise normal.    Past Medical History  Diagnosis Date  . Coronary artery disease   . COPD (chronic obstructive pulmonary disease)   . Hypertension   . High cholesterol   . Asthma     Past Surgical History  Procedure Date  . Coronary angioplasty with stent placement   . Appendectomy     Current Outpatient Prescriptions  Medication Sig Dispense Refill  . aspirin EC 81 MG tablet Take 81 mg by mouth daily.      . Fluticasone-Salmeterol (ADVAIR) 500-50 MCG/DOSE AEPB Inhale 1 puff into the lungs every 12 (twelve) hours.      Marland Kitchen lisinopril-hydrochlorothiazide (PRINZIDE,ZESTORETIC) 20-25 MG per tablet Take 1 tablet by mouth daily.      . pantoprazole (PROTONIX) 20 MG tablet Take 1 tablet (20 mg total) by mouth daily.  30 tablet  0  . simvastatin (ZOCOR) 40 MG tablet Take 40 mg by mouth every evening.      . sucralfate (CARAFATE) 1 GM/10ML suspension Take 1 g by mouth 4 (four) times daily.        Allergies as of 08/05/2012  . (No Known Allergies)    Family History  Problem Relation Age of Onset  . Diabetes Father     History    Social History  . Marital Status: Married    Spouse Name: N/A    Number of Children: 4  . Years of Education: N/A   Occupational History  . retired    Social History Main Topics  . Smoking status: Former Smoker    Types: Cigarettes    Quit date: 08/13/2005  . Smokeless tobacco: Current User    Types: Chew  . Alcohol Use: No  . Drug Use: No  . Sexually Active: Not on file   Other Topics Concern  . Not on file   Social History Narrative  . No narrative on file       Physical Exam: BP 106/52  Pulse 76  Ht 5' 6.5" (1.689 m)  Wt 159 lb 6 oz (72.292 kg)  BMI 25.34 kg/m2 Constitutional: generally well-appearing Psychiatric: alert and oriented x3 Eyes: extraocular movements intact Mouth: oral pharynx moist, no lesions Neck: supple no lymphadenopathy Cardiovascular: heart regular rate and rhythm Lungs: clear to auscultation bilaterally Abdomen: soft, nontender, nondistended, no obvious ascites, no peritoneal signs, normal bowel sounds Extremities: no lower extremity edema bilaterally Skin: no lesions on visible extremities    Assessment and plan: 78 y.o. male with  intermittent epigastric pain  Ultrasound 5 months ago showed normal gallbladder, normal bile duct. He continues to have intermittent epigastric discomfort. I think the next best test is a CT scan of abdomen pelvis, lab testing including CBC, cmet.  Depending on those results he might need upper endoscopy as well.

## 2012-08-07 ENCOUNTER — Other Ambulatory Visit: Payer: PRIVATE HEALTH INSURANCE

## 2012-08-25 ENCOUNTER — Encounter (INDEPENDENT_AMBULATORY_CARE_PROVIDER_SITE_OTHER): Payer: PRIVATE HEALTH INSURANCE | Admitting: Ophthalmology

## 2012-08-25 DIAGNOSIS — I10 Essential (primary) hypertension: Secondary | ICD-10-CM

## 2012-08-25 DIAGNOSIS — H353290 Exudative age-related macular degeneration, unspecified eye, stage unspecified: Secondary | ICD-10-CM

## 2012-08-25 DIAGNOSIS — H35039 Hypertensive retinopathy, unspecified eye: Secondary | ICD-10-CM

## 2012-08-25 DIAGNOSIS — H43819 Vitreous degeneration, unspecified eye: Secondary | ICD-10-CM

## 2012-08-25 DIAGNOSIS — H3530 Unspecified macular degeneration: Secondary | ICD-10-CM

## 2012-09-22 ENCOUNTER — Inpatient Hospital Stay: Admission: RE | Admit: 2012-09-22 | Payer: PRIVATE HEALTH INSURANCE | Source: Ambulatory Visit

## 2012-09-25 ENCOUNTER — Other Ambulatory Visit: Payer: PRIVATE HEALTH INSURANCE

## 2012-11-03 ENCOUNTER — Encounter (INDEPENDENT_AMBULATORY_CARE_PROVIDER_SITE_OTHER): Payer: No Typology Code available for payment source | Admitting: Ophthalmology

## 2012-11-03 DIAGNOSIS — H353290 Exudative age-related macular degeneration, unspecified eye, stage unspecified: Secondary | ICD-10-CM

## 2012-11-03 DIAGNOSIS — I10 Essential (primary) hypertension: Secondary | ICD-10-CM

## 2012-11-03 DIAGNOSIS — H3530 Unspecified macular degeneration: Secondary | ICD-10-CM

## 2012-11-03 DIAGNOSIS — H43819 Vitreous degeneration, unspecified eye: Secondary | ICD-10-CM

## 2012-11-03 DIAGNOSIS — H35039 Hypertensive retinopathy, unspecified eye: Secondary | ICD-10-CM

## 2013-01-19 ENCOUNTER — Encounter (INDEPENDENT_AMBULATORY_CARE_PROVIDER_SITE_OTHER): Payer: No Typology Code available for payment source | Admitting: Ophthalmology

## 2013-01-23 ENCOUNTER — Encounter (INDEPENDENT_AMBULATORY_CARE_PROVIDER_SITE_OTHER): Payer: No Typology Code available for payment source | Admitting: Ophthalmology

## 2013-01-23 DIAGNOSIS — H3530 Unspecified macular degeneration: Secondary | ICD-10-CM

## 2013-01-23 DIAGNOSIS — H353290 Exudative age-related macular degeneration, unspecified eye, stage unspecified: Secondary | ICD-10-CM

## 2013-01-23 DIAGNOSIS — I10 Essential (primary) hypertension: Secondary | ICD-10-CM

## 2013-01-23 DIAGNOSIS — H2510 Age-related nuclear cataract, unspecified eye: Secondary | ICD-10-CM

## 2013-01-23 DIAGNOSIS — H43819 Vitreous degeneration, unspecified eye: Secondary | ICD-10-CM

## 2013-01-23 DIAGNOSIS — H35039 Hypertensive retinopathy, unspecified eye: Secondary | ICD-10-CM

## 2013-04-17 ENCOUNTER — Encounter (INDEPENDENT_AMBULATORY_CARE_PROVIDER_SITE_OTHER): Payer: No Typology Code available for payment source | Admitting: Ophthalmology

## 2013-04-17 DIAGNOSIS — H43819 Vitreous degeneration, unspecified eye: Secondary | ICD-10-CM

## 2013-04-17 DIAGNOSIS — I10 Essential (primary) hypertension: Secondary | ICD-10-CM

## 2013-04-17 DIAGNOSIS — H353290 Exudative age-related macular degeneration, unspecified eye, stage unspecified: Secondary | ICD-10-CM

## 2013-04-17 DIAGNOSIS — H2510 Age-related nuclear cataract, unspecified eye: Secondary | ICD-10-CM

## 2013-04-17 DIAGNOSIS — H35039 Hypertensive retinopathy, unspecified eye: Secondary | ICD-10-CM

## 2013-04-17 DIAGNOSIS — H3530 Unspecified macular degeneration: Secondary | ICD-10-CM

## 2013-04-29 ENCOUNTER — Emergency Department: Payer: Self-pay

## 2013-04-29 ENCOUNTER — Telehealth: Payer: Self-pay | Admitting: Gastroenterology

## 2013-04-29 ENCOUNTER — Telehealth: Payer: Self-pay

## 2013-04-29 LAB — COMPREHENSIVE METABOLIC PANEL
Albumin: 3.8 g/dL (ref 3.4–5.0)
Alkaline Phosphatase: 79 U/L (ref 50–136)
BUN: 17 mg/dL (ref 7–18)
Bilirubin,Total: 0.4 mg/dL (ref 0.2–1.0)
Calcium, Total: 9.8 mg/dL (ref 8.5–10.1)
Chloride: 102 mmol/L (ref 98–107)
EGFR (African American): >60
EGFR (Non-African Amer.): >60
Glucose: 120 mg/dL — ABNORMAL HIGH (ref 65–99)
Potassium: 3.3 mmol/L — ABNORMAL LOW (ref 3.5–5.1)
SGOT(AST): 30 U/L (ref 15–37)
SGPT (ALT): 24 U/L (ref 12–78)
Sodium: 135 mmol/L — ABNORMAL LOW (ref 136–145)
Total Protein: 7.9 g/dL (ref 6.4–8.2)

## 2013-04-29 LAB — URINALYSIS, COMPLETE
Bacteria: NONE SEEN
Bilirubin,UR: NEGATIVE
Blood: NEGATIVE
Glucose,UR: NEGATIVE mg/dL (ref 0–75)
Ketone: NEGATIVE
Leukocyte Esterase: NEGATIVE
Nitrite: NEGATIVE
Ph: 5 (ref 4.5–8.0)
RBC,UR: <1 /HPF (ref 0–5)
Specific Gravity: 1.02 (ref 1.003–1.030)
Squamous Epithelial: NONE SEEN
WBC UR: 1 /HPF (ref 0–5)

## 2013-04-29 LAB — CBC
MCH: 28.7 pg (ref 26.0–34.0)
MCHC: 33.1 g/dL (ref 32.0–36.0)
MCV: 87 fL (ref 80–100)
Platelet: 253 10*3/uL (ref 150–440)
WBC: 11.3 10*3/uL — ABNORMAL HIGH (ref 3.8–10.6)

## 2013-04-29 LAB — LIPASE, BLOOD: Lipase: 154 U/L (ref 73–393)

## 2013-04-29 NOTE — Telephone Encounter (Signed)
Pt cancelled his CT from December 2013.  He is calling now because he is having abd pain and wants to reschedule the CT.  Is this ok with you?  He will also need labs

## 2013-04-29 NOTE — Telephone Encounter (Signed)
Pt is aware he will need f/u, pt's wife will call and set up appt

## 2013-04-29 NOTE — Telephone Encounter (Signed)
He should have rov with me before rescheduling CT or labs, needs to be reassessed clinically.

## 2013-04-30 ENCOUNTER — Encounter: Payer: Self-pay | Admitting: Gastroenterology

## 2013-04-30 NOTE — Telephone Encounter (Signed)
Error

## 2013-04-30 NOTE — Telephone Encounter (Signed)
Pt given an appt for 05/01/13 930 am with Doug Sou.  Pt was in the ER last night and not able to make appt for today. He is having abd pain and nausea.  Was scheduled for a CT scan in Dec appt was cx per pt.  Dr Christella Hartigan states pt needs to be reevaluated

## 2013-05-01 ENCOUNTER — Encounter: Payer: Self-pay | Admitting: Gastroenterology

## 2013-05-01 ENCOUNTER — Other Ambulatory Visit (INDEPENDENT_AMBULATORY_CARE_PROVIDER_SITE_OTHER): Payer: PRIVATE HEALTH INSURANCE

## 2013-05-01 ENCOUNTER — Ambulatory Visit (INDEPENDENT_AMBULATORY_CARE_PROVIDER_SITE_OTHER): Payer: PRIVATE HEALTH INSURANCE | Admitting: Gastroenterology

## 2013-05-01 VITALS — BP 100/52 | HR 84 | Ht 66.5 in | Wt 149.2 lb

## 2013-05-01 DIAGNOSIS — R634 Abnormal weight loss: Secondary | ICD-10-CM

## 2013-05-01 DIAGNOSIS — R1013 Epigastric pain: Secondary | ICD-10-CM

## 2013-05-01 LAB — BASIC METABOLIC PANEL
GFR: 68.44 mL/min (ref >60.00)
Glucose, Bld: 91 mg/dL (ref 70–99)
Potassium: 3.3 mEq/L — ABNORMAL LOW (ref 3.5–5.1)
Sodium: 136 mEq/L (ref 135–145)

## 2013-05-01 NOTE — Progress Notes (Signed)
05/01/2013 Leon Estes [id number] 23-May-1931   History of Present Illness:  Patient is an 79 year old male who is a patient of Dr. Christella Hartigan'.  He was seen by Dr. Christella Hartigan in December of last year when he presented with complaint so intermittent epigastric abdominal pain.  It was recommended that he have a CT scan of the abdomen and pelvis, however, the patient ended up canceling the CT scan because he was feeling better at that time.  He presents back to our office today with the same complaints of epigastric pain that have still been coming and going over the past several months.  Pain comes and is very severe and usually resolves with pain meds given by PCP or in the ED.  Does not recur again for several months sometimes.  He had another episode this week and went to the ED at [facility] where he was given pain medication and carafate liquid.  He says that today he still does not feel very well and has no appetite.  Is afraid to eat because he is afraid that the pain will come back again.  Lost four pounds in the past few days.  Denies nausea or vomiting.  No real heartburn or reflux symptoms but takes pantoprazole daily.  In ED at [facility] CBC showed WBC count of 11.3, normal CMP.  Lipase was normal as well.    Previous ultrasound last year was essentially normal.  Had an appendectomy several years ago but otherwise no abdominal surgeries.     Current Medications, Allergies, Past Medical History, Past Surgical History, Family History and Social History were reviewed in [employer] record.   Physical Exam: BP 100/52  Pulse 84  Ht 5' 6.5" (1.689 m)  Wt 149 lb 4 oz (67.699 kg)  BMI 23.73 kg/m2 General:  Elderly white male in no acute distress Head: Normocephalic and atraumatic Eyes:  sclerae anicteric, conjunctiva pink  Ears: Normal auditory acuity Lungs: Clear throughout to auscultation Heart: Regular rate and rhythm Abdomen: Soft, non-distended.  BS present.  Mild  epigastric TTP without R/R/G. Musculoskeletal: Symmetrical with no gross deformities  Extremities: No edema  Neurological: Alert oriented x 4, grossly nonfocal Psychological:  Alert and cooperative. Normal mood and affect  Assessment and Recommendations: -79 y.o. male with intermittent epigastric pain.  He saw Dr. Christella Hartigan 9 months ago and recommended a CT scan at that time, but patient canceled because he was feeling better.  He now returns with the same complaints along with no appetite and weight loss.  I think that we should reorder the CT scan of abdomen pelvis with contrast.  BMP to check renal function.  Depending on those results he might need upper endoscopy as well.  Continue protonix daily and carafate prn in the interim.

## 2013-05-01 NOTE — Progress Notes (Signed)
i agree with the plan above 

## 2013-05-01 NOTE — Patient Instructions (Addendum)
You have been given a separate informational sheet regarding your tobacco use, the importance of quitting and local resources to help you quit. Please go to the basement level to have your labs drawn.    You have been scheduled for a CT scan of the abdomen and pelvis at [facility] ([address]---this is in the same building as Architectural technologist).   You are scheduled on 05-04-2013 at 9:30 pm . You should arrive at 9:15 am. prior to your appointment time for registration. Please follow the written instructions below on the day of your exam:  WARNING: IF YOU ARE ALLERGIC TO IODINE/X-RAY DYE, PLEASE NOTIFY RADIOLOGY IMMEDIATELY AT [phone]! YOU WILL BE GIVEN A 13 HOUR PREMEDICATION PREP.  1) Do not eat or drink anything after 5:30 am  (4 hours prior to your test) 2) You have been given 2 bottles of oral contrast to drink. The solution may taste better if refrigerated, but do NOT add ice or any other liquid to this solution. Shake well before drinking.    Drink 1 bottle of contrast @ 7:30 am  (2 hours prior to your exam)  Drink 1 bottle of contrast @ 8:30 am  (1 hour prior to your exam)  You may take any medications as prescribed with a small amount of water except for the following: Metformin, Glucophage, Glucovance, Avandamet, Riomet, Fortamet, Actoplus Met, Janumet, Glumetza or Metaglip. The above medications must be held the day of the exam AND 48 hours after the exam.  The purpose of you drinking the oral contrast is to aid in the visualization of your intestinal tract. The contrast solution may cause some diarrhea. Before your exam is started, you will be given a small amount of fluid to drink. Depending on your individual set of symptoms, you may also receive an intravenous injection of x-ray contrast/dye. Plan on being at [facility] for 30 minutes or long, depending on the type of exam you are having performed.  If you have any questions regarding your exam or if  you need to reschedule, you may call the CT department at [phone] between the hours of 8:00 am and 5:00 pm, Monday-Friday.  ________________________________________________________________________

## 2013-05-04 ENCOUNTER — Ambulatory Visit (INDEPENDENT_AMBULATORY_CARE_PROVIDER_SITE_OTHER)
Admission: RE | Admit: 2013-05-04 | Discharge: 2013-05-04 | Disposition: A | Discharge status: Home / Self Care | Payer: PRIVATE HEALTH INSURANCE | Source: Ambulatory Visit | Attending: Gastroenterology | Admitting: Gastroenterology

## 2013-05-04 DIAGNOSIS — R634 Abnormal weight loss: Secondary | ICD-10-CM

## 2013-05-04 DIAGNOSIS — R1013 Epigastric pain: Secondary | ICD-10-CM

## 2013-05-04 MED ORDER — IOHEXOL 300 MG/ML  SOLN
100.0000 mL | Freq: Once | INTRAMUSCULAR | Status: AC | PRN
  Administered 2013-05-04: 100 mL via INTRAVENOUS

## 2013-05-06 ENCOUNTER — Other Ambulatory Visit: Payer: Self-pay | Admitting: *Deleted

## 2013-05-06 DIAGNOSIS — R1013 Epigastric pain: Secondary | ICD-10-CM

## 2013-05-07 ENCOUNTER — Encounter: Payer: Self-pay | Admitting: Gastroenterology

## 2013-05-19 ENCOUNTER — Ambulatory Visit (AMBULATORY_SURGERY_CENTER): Payer: PRIVATE HEALTH INSURANCE

## 2013-05-19 VITALS — Ht 67.0 in | Wt 151.4 lb

## 2013-05-19 DIAGNOSIS — R1013 Epigastric pain: Secondary | ICD-10-CM

## 2013-05-21 ENCOUNTER — Encounter: Payer: Self-pay | Admitting: Gastroenterology

## 2013-05-22 ENCOUNTER — Encounter: Payer: Self-pay | Admitting: Gastroenterology

## 2013-05-22 ENCOUNTER — Ambulatory Visit (AMBULATORY_SURGERY_CENTER): Payer: PRIVATE HEALTH INSURANCE | Admitting: Gastroenterology

## 2013-05-22 VITALS — BP 111/52 | HR 62 | Temp 97.6°F | Resp 17 | Ht 67.0 in | Wt 151.0 lb

## 2013-05-22 DIAGNOSIS — K2970 Gastritis, unspecified, without bleeding: Secondary | ICD-10-CM

## 2013-05-22 DIAGNOSIS — R1013 Epigastric pain: Secondary | ICD-10-CM

## 2013-05-22 DIAGNOSIS — K2990 Gastroduodenitis, unspecified, without bleeding: Secondary | ICD-10-CM

## 2013-05-22 DIAGNOSIS — R933 Abnormal findings on diagnostic imaging of other parts of digestive tract: Secondary | ICD-10-CM

## 2013-05-22 MED ORDER — SODIUM CHLORIDE 0.9 % IV SOLN: 500.0000 mL | INTRAVENOUS | Status: DC

## 2013-05-22 NOTE — Op Note (Signed)
[facility] [address] [place], [address]   ENDOSCOPY PROCEDURE REPORT  PATIENT: Leon Estes, Leon Estes  MR#: [id number] BIRTHDATE: 07-24-1931 , 82  yrs. old GENDER: Male ENDOSCOPIST: Rachael Fee, MD PROCEDURE DATE:  05/22/2013 PROCEDURE:  EGD w/ biopsy ASA CLASS:     Class III INDICATIONS:  intermittent epigastric pain, none in 3-4 weeks now; recent CT scan unrevealing. MEDICATIONS: Fentanyl 37.5 mcg IV, Versed 3 mg IV, and These medications were titrated to patient response per physician's verbal order TOPICAL ANESTHETIC: Cetacaine Spray  DESCRIPTION OF PROCEDURE: After the risks benefits and alternatives of the procedure were thoroughly explained, informed consent was obtained.  The LB [id number] endoscope was introduced through the mouth and advanced to the proximal jejunum. Without limitations.  The instrument was slowly withdrawn as the mucosa was fully examined.   He appears to have a Bilroth I anastomosis that is mildly to moderately inflammed on the gastric side (he has no documented gastric surgeries however and I see no midline abd scars).  The rest of the stomach was mildly inflammed as well, non-specifically. I biopsied the gastric side of the anastomosis exensively.  The examination was otherwise normal.  Retroflexed views revealed no abnormalities.     The scope was then withdrawn from the patient and the procedure completed. COMPLICATIONS: There were no complications.  ENDOSCOPIC IMPRESSION: He appears to have a Bilroth I anastomosis that is mildly to moderately inflammed on the gastric side (he has no documented gastric surgeries however and I see no midline abd scars).  The rest of the stomach was mildly inflammed as well, non-specifically. I biopsied the gastric side of the anastomosis exensively.  The examination was otherwise normal.  RECOMMENDATIONS: Await final pathology.  He has not had recurrent pain in 3-4  weeks, however if pains return will consider further workup (UGI with SBFT to define anatomy if there is still a question about previous gastric surgeries, perhaps HIDA scan).   eSigned:  Rachael Fee, MD 05/22/2013 10:37 AM   CC: Renford Dills, MD

## 2013-05-22 NOTE — Progress Notes (Signed)
Patient did not have preoperative order for IV antibiotic SSI prophylaxis. (G8918)  Patient did not experience any of the following events: a burn prior to discharge; a fall within the facility; wrong site/side/patient/procedure/implant event; or a hospital transfer or hospital admission upon discharge from the facility. (G8907)  

## 2013-05-22 NOTE — Patient Instructions (Signed)
YOU HAD AN ENDOSCOPIC PROCEDURE TODAY AT THE [facility]: Refer to the procedure report that was given to you for any specific questions about what was found during the examination.  If the procedure report does not answer your questions, please call your gastroenterologist to clarify.  If you requested that your care partner not be given the details of your procedure findings, then the procedure report has been included in a sealed envelope for you to review at your convenience later.  YOU SHOULD EXPECT: Some feelings of bloating in the abdomen. Passage of more gas than usual.  Walking can help get rid of the air that was put into your GI tract during the procedure and reduce the bloating. If you had a lower endoscopy (such as a colonoscopy or flexible sigmoidoscopy) you may notice spotting of blood in your stool or on the toilet paper. If you underwent a bowel prep for your procedure, then you may not have a normal bowel movement for a few days.  DIET: Your first meal following the procedure should be a light meal and then it is ok to progress to your normal diet.  A half-sandwich or bowl of soup is an example of a good first meal.  Heavy or fried foods are harder to digest and may make you feel nauseous or bloated.  Likewise meals heavy in dairy and vegetables can cause extra gas to form and this can also increase the bloating.  Drink plenty of fluids but you should avoid alcoholic beverages for 24 hours.  ACTIVITY: Your care partner should take you home directly after the procedure.  You should plan to take it easy, moving slowly for the rest of the day.  You can resume normal activity the day after the procedure however you should NOT DRIVE or use heavy machinery for 24 hours (because of the sedation medicines used during the test).    SYMPTOMS TO REPORT IMMEDIATELY: A gastroenterologist can be reached at any hour.  During normal business hours, 8:30 AM to 5:00 PM Monday through Friday,  call [phone].  After hours and on weekends, please call the GI answering service at [phone] who will take a message and have the physician on call contact you.   Following upper endoscopy (EGD)  Vomiting of blood or coffee ground material  New chest pain or pain under the shoulder blades  Painful or persistently difficult swallowing  New shortness of breath  Fever of 100F or higher  Black, tarry-looking stools  FOLLOW UP: If any biopsies were taken you will be contacted by phone or by letter within the next 1-3 weeks.  Call your gastroenterologist if you have not heard about the biopsies in 3 weeks.  Our staff will call the home number listed on your records the next business day following your procedure to check on you and address any questions or concerns that you may have at that time regarding the information given to you following your procedure. This is a courtesy call and so if there is no answer at the home number and we have not heard from you through the emergency physician on call, we will assume that you have returned to your regular daily activities without incident.  If pain returns, call our office.  SIGNATURES/CONFIDENTIALITY: You and/or your care partner have signed paperwork which will be entered into your electronic medical record.  These signatures attest to the fact that that the information above on your After Visit Summary has been  reviewed and is understood.  Full responsibility of the confidentiality of this discharge information lies with you and/or your care-partner. 

## 2013-05-25 ENCOUNTER — Telehealth: Payer: Self-pay | Admitting: *Deleted

## 2013-05-25 NOTE — Telephone Encounter (Signed)
  Follow up Call-  Call back number 05/22/2013  Post procedure Call Back phone  # [phone] ( no voicemail)  Permission to leave phone message No     Patient questions:  Do you have a fever, pain , or abdominal swelling? no Pain Score  0 *  Have you tolerated food without any problems? yes  Have you been able to return to your normal activities? yes  Do you have any questions about your discharge instructions: Diet   no Medications  no Follow up visit  no  Do you have questions or concerns about your Care? no  Actions: * If pain score is 4 or above: No action needed, pain <4.

## 2013-05-26 ENCOUNTER — Other Ambulatory Visit: Payer: PRIVATE HEALTH INSURANCE | Admitting: Gastroenterology

## 2013-07-24 ENCOUNTER — Encounter (INDEPENDENT_AMBULATORY_CARE_PROVIDER_SITE_OTHER): Payer: No Typology Code available for payment source | Admitting: Ophthalmology

## 2013-07-24 DIAGNOSIS — H35039 Hypertensive retinopathy, unspecified eye: Secondary | ICD-10-CM

## 2013-07-24 DIAGNOSIS — H353290 Exudative age-related macular degeneration, unspecified eye, stage unspecified: Secondary | ICD-10-CM

## 2013-07-24 DIAGNOSIS — H43819 Vitreous degeneration, unspecified eye: Secondary | ICD-10-CM

## 2013-07-24 DIAGNOSIS — H3530 Unspecified macular degeneration: Secondary | ICD-10-CM

## 2013-07-24 DIAGNOSIS — I10 Essential (primary) hypertension: Secondary | ICD-10-CM

## 2013-07-24 DIAGNOSIS — H2510 Age-related nuclear cataract, unspecified eye: Secondary | ICD-10-CM

## 2013-12-25 ENCOUNTER — Encounter (INDEPENDENT_AMBULATORY_CARE_PROVIDER_SITE_OTHER): Payer: No Typology Code available for payment source | Admitting: Ophthalmology

## 2013-12-25 DIAGNOSIS — H353290 Exudative age-related macular degeneration, unspecified eye, stage unspecified: Secondary | ICD-10-CM

## 2013-12-25 DIAGNOSIS — H43819 Vitreous degeneration, unspecified eye: Secondary | ICD-10-CM

## 2013-12-25 DIAGNOSIS — H35039 Hypertensive retinopathy, unspecified eye: Secondary | ICD-10-CM

## 2013-12-25 DIAGNOSIS — I10 Essential (primary) hypertension: Secondary | ICD-10-CM

## 2013-12-25 DIAGNOSIS — H3530 Unspecified macular degeneration: Secondary | ICD-10-CM

## 2013-12-25 DIAGNOSIS — H2510 Age-related nuclear cataract, unspecified eye: Secondary | ICD-10-CM

## 2014-01-12 ENCOUNTER — Ambulatory Visit
Admission: RE | Admit: 2014-01-12 | Discharge: 2014-01-12 | Disposition: A | Discharge status: Home / Self Care | Payer: PRIVATE HEALTH INSURANCE | Source: Ambulatory Visit | Attending: Nurse Practitioner | Admitting: Nurse Practitioner

## 2014-01-12 ENCOUNTER — Other Ambulatory Visit: Payer: Self-pay | Admitting: Nurse Practitioner

## 2014-01-12 DIAGNOSIS — M25511 Pain in right shoulder: Secondary | ICD-10-CM

## 2014-07-02 ENCOUNTER — Encounter (INDEPENDENT_AMBULATORY_CARE_PROVIDER_SITE_OTHER): Payer: No Typology Code available for payment source | Admitting: Ophthalmology

## 2014-07-02 DIAGNOSIS — H3532 Exudative age-related macular degeneration: Secondary | ICD-10-CM

## 2014-07-02 DIAGNOSIS — H35033 Hypertensive retinopathy, bilateral: Secondary | ICD-10-CM

## 2014-07-02 DIAGNOSIS — H3531 Nonexudative age-related macular degeneration: Secondary | ICD-10-CM

## 2014-07-02 DIAGNOSIS — I10 Essential (primary) hypertension: Secondary | ICD-10-CM

## 2014-07-02 DIAGNOSIS — H43813 Vitreous degeneration, bilateral: Secondary | ICD-10-CM

## 2014-08-09 ENCOUNTER — Ambulatory Visit
Admission: RE | Admit: 2014-08-09 | Discharge: 2014-08-09 | Disposition: A | Discharge status: Home / Self Care | Payer: PRIVATE HEALTH INSURANCE | Source: Ambulatory Visit | Attending: Nurse Practitioner | Admitting: Nurse Practitioner

## 2014-08-09 ENCOUNTER — Other Ambulatory Visit: Payer: Self-pay | Admitting: Nurse Practitioner

## 2014-08-09 DIAGNOSIS — R0989 Other specified symptoms and signs involving the circulatory and respiratory systems: Secondary | ICD-10-CM

## 2014-09-17 ENCOUNTER — Encounter (INDEPENDENT_AMBULATORY_CARE_PROVIDER_SITE_OTHER): Payer: No Typology Code available for payment source | Admitting: Ophthalmology

## 2014-09-23 ENCOUNTER — Encounter (INDEPENDENT_AMBULATORY_CARE_PROVIDER_SITE_OTHER): Payer: PPO | Admitting: Ophthalmology

## 2014-09-23 DIAGNOSIS — H35033 Hypertensive retinopathy, bilateral: Secondary | ICD-10-CM

## 2014-09-23 DIAGNOSIS — I10 Essential (primary) hypertension: Secondary | ICD-10-CM

## 2014-09-23 DIAGNOSIS — H2512 Age-related nuclear cataract, left eye: Secondary | ICD-10-CM

## 2014-09-23 DIAGNOSIS — H43813 Vitreous degeneration, bilateral: Secondary | ICD-10-CM

## 2014-09-23 DIAGNOSIS — H3531 Nonexudative age-related macular degeneration: Secondary | ICD-10-CM

## 2014-12-23 ENCOUNTER — Encounter (INDEPENDENT_AMBULATORY_CARE_PROVIDER_SITE_OTHER): Payer: PPO | Admitting: Ophthalmology

## 2014-12-23 DIAGNOSIS — H35033 Hypertensive retinopathy, bilateral: Secondary | ICD-10-CM | POA: Diagnosis not present

## 2014-12-23 DIAGNOSIS — H2512 Age-related nuclear cataract, left eye: Secondary | ICD-10-CM | POA: Diagnosis not present

## 2014-12-23 DIAGNOSIS — H43813 Vitreous degeneration, bilateral: Secondary | ICD-10-CM | POA: Diagnosis not present

## 2014-12-23 DIAGNOSIS — H3531 Nonexudative age-related macular degeneration: Secondary | ICD-10-CM

## 2014-12-23 DIAGNOSIS — I10 Essential (primary) hypertension: Secondary | ICD-10-CM | POA: Diagnosis not present

## 2015-03-02 ENCOUNTER — Encounter: Payer: Self-pay | Admitting: *Deleted

## 2015-03-03 ENCOUNTER — Encounter: Payer: Self-pay | Admitting: Cardiovascular Disease

## 2015-03-03 ENCOUNTER — Ambulatory Visit (INDEPENDENT_AMBULATORY_CARE_PROVIDER_SITE_OTHER): Payer: PPO | Admitting: Cardiovascular Disease

## 2015-03-03 VITALS — BP 138/58 | HR 77 | Ht 67.0 in | Wt 166.0 lb

## 2015-03-03 DIAGNOSIS — M25519 Pain in unspecified shoulder: Secondary | ICD-10-CM | POA: Diagnosis not present

## 2015-03-03 DIAGNOSIS — R079 Chest pain, unspecified: Secondary | ICD-10-CM | POA: Diagnosis not present

## 2015-03-03 DIAGNOSIS — I2510 Atherosclerotic heart disease of native coronary artery without angina pectoris: Secondary | ICD-10-CM

## 2015-03-03 DIAGNOSIS — E785 Hyperlipidemia, unspecified: Secondary | ICD-10-CM

## 2015-03-03 DIAGNOSIS — I25119 Atherosclerotic heart disease of native coronary artery with unspecified angina pectoris: Secondary | ICD-10-CM | POA: Insufficient documentation

## 2015-03-03 NOTE — Patient Instructions (Signed)
Medication Instructions:  Your physician recommends that you continue on your current medications as directed. Please refer to the Current Medication list given to you today.   Labwork: none  Testing/Procedures: none  Follow-Up: Your physician wants you to follow-up in: one year with Dr. Nahser. You will receive a reminder letter in the mail two months in advance. If you don't receive a letter, please call our office to schedule the follow-up appointment.   Any Other Special Instructions Will Be Listed Below (If Applicable).   

## 2015-03-03 NOTE — Progress Notes (Signed)
Cardiology Office Note   Date:  03/03/2015   ID:  Leon Estes, DOB 07/09/1931, MRN [id number]  PCP:  Kandice Hams, MD  Cardiologist:   Thayer Headings, MD / previously saw [name], MD at [facility] Complaint  Patient presents with  . other    C/o chest/rib/shoulder pain. Meds reviewed verbally with pt.   Problem list: 1. Coronary artery disease-status post stenting of the right coronary artery in 1997 2. COPD 3. Hyperlipidemia   History of Present Illness: Leon Estes is a 81 y.o. male who presents for follow up of his CP He had am MI in 1997.  Had 2 stents placed by Dr. Glade Lloyd. Hs seen Skains in the past, wants to see a cardiologist here in [place] .  Has done well,  No further of CP.  Exercises regularly,  Mows 3 acres.   Goes bowling once a week.  Also has some pain down his leg.  Able to weed-eat without any problems  Sees Dr. Delfina Redwood. Was recently found to have mild anemia.  Was started on iron at that time .   Past Medical History  Diagnosis Date  . Coronary artery disease     2 stents  . COPD (chronic obstructive pulmonary disease)   . Hypertension   . High cholesterol   . Asthma   . Hearing loss     DOES NOT WEAR HEARING AIDS  . Macular degeneration     right eye  . Chronic sinusitis   . BPH (benign prostatic hypertrophy)   . Gall stones   . Cataract     Past Surgical History  Procedure Laterality Date  . Appendectomy    . Transurethral resection of prostate    . Coronary angioplasty with stent placement  1998    2 stents     Current Outpatient Prescriptions  Medication Sig Dispense Refill  . aspirin EC 81 MG tablet Take 81 mg by mouth daily.    . budesonide-formoterol (SYMBICORT) 160-4.5 MCG/ACT inhaler Inhale 2 puffs into the lungs daily.    . ferrous sulfate 325 (65 FE) MG tablet Take 325 mg by mouth daily with breakfast.    . gabapentin (NEURONTIN) 300 MG capsule Take 300 mg by mouth at bedtime.    Marland Kitchen  lisinopril-hydrochlorothiazide (PRINZIDE,ZESTORETIC) 20-25 MG per tablet Take 1 tablet by mouth daily.    . pantoprazole (PROTONIX) 20 MG tablet Take 20 mg by mouth daily.    . simvastatin (ZOCOR) 40 MG tablet Take 40 mg by mouth every evening.     No current facility-administered medications for this visit.    Allergies:   Review of patient's allergies indicates no known allergies.    Social History:  The patient  reports that he quit smoking about 9 years ago. His smoking use included Cigarettes. His smokeless tobacco use includes Chew. He reports that he does not drink alcohol or use illicit drugs.   Family History:  The patient's family history includes Diabetes in his father; Heart disease in his brother.    ROS:  Please see the history of present illness.    Review of Systems: Constitutional:  denies fever, chills, diaphoresis, appetite change and fatigue.  HEENT: denies photophobia, eye pain, redness, hearing loss, ear pain, congestion, sore throat, rhinorrhea, sneezing, neck pain, neck stiffness and tinnitus.  Respiratory: denies SOB, DOE, cough, chest tightness, and wheezing.  Cardiovascular: denies chest pain, palpitations and leg swelling.  Gastrointestinal: denies nausea, vomiting, abdominal pain, diarrhea,  constipation, blood in stool.  Genitourinary: denies dysuria, urgency, frequency, hematuria, flank pain and difficulty urinating.  Musculoskeletal: denies  myalgias, back pain, joint swelling, arthralgias and gait problem.   Skin: denies pallor, rash and wound.  Neurological: denies dizziness, seizures, syncope, weakness, light-headedness, numbness and headaches.   Hematological: denies adenopathy, easy bruising, personal or family bleeding history.  Psychiatric/ Behavioral: denies suicidal ideation, mood changes, confusion, nervousness, sleep disturbance and agitation.       All other systems are reviewed and negative.    PHYSICAL EXAM: VS:  BP 138/58 mmHg   Pulse 77  Ht 5\' 7"  (1.702 m)  Wt 75.297 kg (166 lb)  BMI 25.99 kg/m2 , BMI Body mass index is 25.99 kg/(m^2). GEN: Well nourished, well developed, in no acute distress HEENT: normal Neck: no JVD, carotid bruits, or masses Cardiac: RRR; no murmurs, rubs, or gallops,no edema  Respiratory:  clear to auscultation bilaterally, normal work of breathing GI: soft, nontender, nondistended, + BS MS: no deformity or atrophy Skin: warm and dry, no rash Neuro:  Strength and sensation are intact Psych: normal   EKG:  EKG is ordered today. The ekg ordered today demonstrates NSR at 77.  RBBB, PVCs. No changes from previous    Recent Labs: No results found for requested labs within last 365 days.    Lipid Panel No results found for: CHOL, TRIG, HDL, CHOLHDL, VLDL, LDLCALC, LDLDIRECT    Wt Readings from Last 3 Encounters:  03/03/15 75.297 kg (166 lb)  05/22/13 68.493 kg (151 lb)  05/19/13 68.675 kg (151 lb 6.4 oz)      Other studies Reviewed: Additional studies/ records that were reviewed today include: . Review of the above records demonstrates:    ASSESSMENT AND PLAN:  1. Coronary artery disease-status post stenting of the right coronary artery in 1997 He's doing very well. He's not had any episodes of angina. Continue current medications. We'll get fasting labs from his medical doctor's office. I'll see him again in one year.  2. COPD 3. Hyperlipidemia   Current medicines are reviewed at length with the patient today.  The patient does not have concerns regarding medicines.  The following changes have been made:  no change  Labs/ tests ordered today include:  Orders Placed This Encounter  Procedures  . EKG 12-Lead     Disposition:   FU with me in 1 year      Siarah Deleo, Wonda Cheng, MD  03/03/2015 2:12 PM    [facility] [place], [place], [place]  [address] Phone: [phone]; Fax: [phone]   [facility]  [address] [place], [place]  [address] [phone]   Fax [phone]

## 2015-04-28 ENCOUNTER — Encounter (INDEPENDENT_AMBULATORY_CARE_PROVIDER_SITE_OTHER): Payer: PPO | Admitting: Ophthalmology

## 2015-04-28 DIAGNOSIS — H43813 Vitreous degeneration, bilateral: Secondary | ICD-10-CM | POA: Diagnosis not present

## 2015-04-28 DIAGNOSIS — H35033 Hypertensive retinopathy, bilateral: Secondary | ICD-10-CM | POA: Diagnosis not present

## 2015-04-28 DIAGNOSIS — H3531 Nonexudative age-related macular degeneration: Secondary | ICD-10-CM

## 2015-04-28 DIAGNOSIS — I10 Essential (primary) hypertension: Secondary | ICD-10-CM

## 2015-08-29 DIAGNOSIS — J069 Acute upper respiratory infection, unspecified: Secondary | ICD-10-CM | POA: Diagnosis not present

## 2015-09-28 ENCOUNTER — Encounter (INDEPENDENT_AMBULATORY_CARE_PROVIDER_SITE_OTHER): Payer: PPO | Admitting: Ophthalmology

## 2015-09-29 ENCOUNTER — Encounter (INDEPENDENT_AMBULATORY_CARE_PROVIDER_SITE_OTHER): Payer: PPO | Admitting: Ophthalmology

## 2015-10-06 DIAGNOSIS — L570 Actinic keratosis: Secondary | ICD-10-CM | POA: Diagnosis not present

## 2015-10-06 DIAGNOSIS — L578 Other skin changes due to chronic exposure to nonionizing radiation: Secondary | ICD-10-CM | POA: Diagnosis not present

## 2015-10-25 ENCOUNTER — Encounter (INDEPENDENT_AMBULATORY_CARE_PROVIDER_SITE_OTHER): Payer: PPO | Admitting: Ophthalmology

## 2015-10-25 DIAGNOSIS — H35033 Hypertensive retinopathy, bilateral: Secondary | ICD-10-CM

## 2015-10-25 DIAGNOSIS — H353231 Exudative age-related macular degeneration, bilateral, with active choroidal neovascularization: Secondary | ICD-10-CM | POA: Diagnosis not present

## 2015-10-25 DIAGNOSIS — H43813 Vitreous degeneration, bilateral: Secondary | ICD-10-CM | POA: Diagnosis not present

## 2015-10-25 DIAGNOSIS — I10 Essential (primary) hypertension: Secondary | ICD-10-CM

## 2016-01-12 DIAGNOSIS — J449 Chronic obstructive pulmonary disease, unspecified: Secondary | ICD-10-CM | POA: Diagnosis not present

## 2016-01-12 DIAGNOSIS — N183 Chronic kidney disease, stage 3 (moderate): Secondary | ICD-10-CM | POA: Diagnosis not present

## 2016-01-12 DIAGNOSIS — K130 Diseases of lips: Secondary | ICD-10-CM | POA: Diagnosis not present

## 2016-01-12 DIAGNOSIS — I10 Essential (primary) hypertension: Secondary | ICD-10-CM | POA: Diagnosis not present

## 2016-01-12 DIAGNOSIS — E7800 Pure hypercholesterolemia, unspecified: Secondary | ICD-10-CM | POA: Diagnosis not present

## 2016-01-13 ENCOUNTER — Encounter: Payer: Self-pay | Admitting: Cardiovascular Disease

## 2016-01-13 ENCOUNTER — Ambulatory Visit (INDEPENDENT_AMBULATORY_CARE_PROVIDER_SITE_OTHER): Payer: PPO | Admitting: Cardiovascular Disease

## 2016-01-13 VITALS — BP 130/70 | HR 94 | Ht 67.0 in | Wt 165.4 lb

## 2016-01-13 DIAGNOSIS — I2510 Atherosclerotic heart disease of native coronary artery without angina pectoris: Secondary | ICD-10-CM | POA: Diagnosis not present

## 2016-01-13 DIAGNOSIS — E785 Hyperlipidemia, unspecified: Secondary | ICD-10-CM

## 2016-01-13 MED ORDER — METOPROLOL SUCCINATE ER 25 MG PO TB24: 25.0000 mg | ORAL_TABLET | Freq: Every day | ORAL | Status: DC

## 2016-01-13 NOTE — Patient Instructions (Addendum)
Medication Instructions:  START Metoprolol (Toprol XL) 25 mg once daily  Labwork: None Ordered   Testing/Procedures: None Ordered   Follow-Up: Your physician wants you to follow-up in: 1 year with Dr. Acie Fredrickson.  You will receive a reminder letter in the mail two months in advance. If you don't receive a letter, please call our office to schedule the follow-up appointment.   If you need a refill on your cardiac medications before your next appointment, please call your pharmacy.   Thank you for choosing [facility]! Christen Bame, RN [phone]

## 2016-01-13 NOTE — Progress Notes (Signed)
Cardiology Office Note   Date:  01/13/2016   ID:  Leon Estes, DOB [birth year], MRN [id number]  PCP:  Kandice Hams, MD  Cardiologist:   Mertie Moores, MD / previously saw Leon Furbish, MD at [facility]  No chief complaint on file.  Problem list: 1. Coronary artery disease-status post stenting of the right coronary artery in 1997 2. COPD 3. Hyperlipidemia   History of Present Illness: Leon Estes is a 82 y.o. male who presents for follow up of his CP He had am MI in 1997.  Had 2 stents placed by Dr. Glade Lloyd. Hs seen Skains in the past, wants to see a cardiologist here in [place] .  Has done well,  No further of CP.  Exercises regularly,  Mows 3 acres.   Goes bowling once a week.  Also has some pain down his leg.  Able to weed-eat without any problems  Sees Dr. Delfina Redwood. Was recently found to have mild anemia.  Was started on iron at that time .   January 13, 2016:  Doing well.  Still very active.  Mows his 3 acres and weed-eats .   Bowls on wednesdays   Past Medical History  Diagnosis Date  . Coronary artery disease     2 stents  . COPD (chronic obstructive pulmonary disease) ([place])   . Hypertension   . High cholesterol   . Asthma   . Hearing loss     DOES NOT WEAR HEARING AIDS  . Macular degeneration     right eye  . Chronic sinusitis   . BPH (benign prostatic hypertrophy)   . Gall stones   . Cataract     Past Surgical History  Procedure Laterality Date  . Appendectomy    . Transurethral resection of prostate    . Coronary angioplasty with stent placement  1998    2 stents     Current Outpatient Prescriptions  Medication Sig Dispense Refill  . aspirin EC 81 MG tablet Take 81 mg by mouth daily.    . budesonide-formoterol (SYMBICORT) 160-4.5 MCG/ACT inhaler Inhale 2 puffs into the lungs daily.    . ferrous sulfate 325 (65 FE) MG tablet Take 325 mg by mouth daily with breakfast.    . gabapentin (NEURONTIN) 300 MG capsule Take 300 mg by mouth at bedtime.     Marland Kitchen lisinopril-hydrochlorothiazide (PRINZIDE,ZESTORETIC) 20-25 MG per tablet Take 1 tablet by mouth daily.    . pantoprazole (PROTONIX) 20 MG tablet Take 20 mg by mouth daily.    . simvastatin (ZOCOR) 40 MG tablet Take 40 mg by mouth every evening.     No current facility-administered medications for this visit.    Allergies:   Review of patient's allergies indicates no known allergies.    Social History:  The patient  reports that he quit smoking about 10 years ago. His smoking use included Cigarettes. His smokeless tobacco use includes Chew. He reports that he does not drink alcohol or use illicit drugs.   Family History:  The patient's family history includes Diabetes in his father; Heart disease in his brother.    ROS:  Please see the history of present illness.    Review of Systems: Constitutional:  denies fever, chills, diaphoresis, appetite change and fatigue.  HEENT: denies photophobia, eye pain, redness, hearing loss, ear pain, congestion, sore throat, rhinorrhea, sneezing, neck pain, neck stiffness and tinnitus.  Respiratory: denies SOB, DOE, cough, chest tightness, and wheezing.  Cardiovascular: denies chest pain, palpitations and leg  swelling.  Gastrointestinal: denies nausea, vomiting, abdominal pain, diarrhea, constipation, blood in stool.  Genitourinary: denies dysuria, urgency, frequency, hematuria, flank pain and difficulty urinating.  Musculoskeletal: denies  myalgias, back pain, joint swelling, arthralgias and gait problem.   Skin: denies pallor, rash and wound.  Neurological: denies dizziness, seizures, syncope, weakness, light-headedness, numbness and headaches.   Hematological: denies adenopathy, easy bruising, personal or family bleeding history.  Psychiatric/ Behavioral: denies suicidal ideation, mood changes, confusion, nervousness, sleep disturbance and agitation.       All other systems are reviewed and negative.    PHYSICAL EXAM: VS:  BP 130/70 mmHg   Pulse 94  Ht 5\' 7"  (1.702 m)  Wt 165 lb 6.4 oz (75.025 kg)  BMI 25.90 kg/m2 , BMI Body mass index is 25.9 kg/(m^2). GEN: Well nourished, well developed, in no acute distress HEENT: normal Neck: no JVD, carotid bruits, or masses Cardiac: RRR; no murmurs, rubs, or gallops,no edema  Respiratory:  clear to auscultation bilaterally, normal work of breathing GI: soft, nontender, nondistended, + BS MS: no deformity or atrophy Skin: warm and dry, no rash Neuro:  Strength and sensation are intact Psych: normal   EKG:  EKG is ordered today. The ekg ordered today demonstrates NSR at 94  RBBB, PVCs. No changes from previous    Recent Labs: No results found for requested labs within last 365 days.    Lipid Panel No results found for: CHOL, TRIG, HDL, CHOLHDL, VLDL, LDLCALC, LDLDIRECT    Wt Readings from Last 3 Encounters:  01/13/16 165 lb 6.4 oz (75.025 kg)  03/03/15 166 lb (75.297 kg)  05/22/13 151 lb (68.493 kg)      Other studies Reviewed: Additional studies/ records that were reviewed today include: . Review of the above records demonstrates:    ASSESSMENT AND PLAN:  1. Coronary artery disease-status post stenting of the right coronary artery in 1997 He's doing very well. He's not had any episodes of angina. Continue current medications. We'll get fasting labs from his medical doctor's office. I'll see him again in one year. HR is OK but is on the high side.  Will try low dose  Toprol XL 25 mg a day   2. COPD 3. Hyperlipidemia - managed by his primary MD    Current medicines are reviewed at length with the patient today.  The patient does not have concerns regarding medicines.  The following changes have been made:  no change  Labs/ tests ordered today include:  No orders of the defined types were placed in this encounter.    Disposition:   FU with me in 1 year     Mertie Moores, MD  01/13/2016 11:57 AM    [address],  [place], [place]  [address] Phone: [phone]; Fax: [phone]

## 2016-02-06 DIAGNOSIS — L821 Other seborrheic keratosis: Secondary | ICD-10-CM | POA: Diagnosis not present

## 2016-02-06 DIAGNOSIS — L570 Actinic keratosis: Secondary | ICD-10-CM | POA: Diagnosis not present

## 2016-02-06 DIAGNOSIS — L578 Other skin changes due to chronic exposure to nonionizing radiation: Secondary | ICD-10-CM | POA: Diagnosis not present

## 2016-02-06 DIAGNOSIS — L720 Epidermal cyst: Secondary | ICD-10-CM | POA: Diagnosis not present

## 2016-02-21 ENCOUNTER — Institutional Professional Consult (permissible substitution): Payer: PPO | Admitting: Cardiology

## 2016-04-03 ENCOUNTER — Encounter (INDEPENDENT_AMBULATORY_CARE_PROVIDER_SITE_OTHER): Payer: PPO | Admitting: Ophthalmology

## 2016-04-03 DIAGNOSIS — I10 Essential (primary) hypertension: Secondary | ICD-10-CM | POA: Diagnosis not present

## 2016-04-03 DIAGNOSIS — H353231 Exudative age-related macular degeneration, bilateral, with active choroidal neovascularization: Secondary | ICD-10-CM | POA: Diagnosis not present

## 2016-04-03 DIAGNOSIS — H2512 Age-related nuclear cataract, left eye: Secondary | ICD-10-CM

## 2016-04-03 DIAGNOSIS — H35033 Hypertensive retinopathy, bilateral: Secondary | ICD-10-CM

## 2016-04-03 DIAGNOSIS — H43813 Vitreous degeneration, bilateral: Secondary | ICD-10-CM

## 2016-06-21 DIAGNOSIS — C44612 Basal cell carcinoma of skin of right upper limb, including shoulder: Secondary | ICD-10-CM | POA: Diagnosis not present

## 2016-06-21 DIAGNOSIS — L578 Other skin changes due to chronic exposure to nonionizing radiation: Secondary | ICD-10-CM | POA: Diagnosis not present

## 2016-07-19 DIAGNOSIS — Z85828 Personal history of other malignant neoplasm of skin: Secondary | ICD-10-CM | POA: Diagnosis not present

## 2016-07-26 DIAGNOSIS — I10 Essential (primary) hypertension: Secondary | ICD-10-CM | POA: Diagnosis not present

## 2016-07-26 DIAGNOSIS — N183 Chronic kidney disease, stage 3 (moderate): Secondary | ICD-10-CM | POA: Diagnosis not present

## 2016-07-26 DIAGNOSIS — Z0000 Encounter for general adult medical examination without abnormal findings: Secondary | ICD-10-CM | POA: Diagnosis not present

## 2016-07-26 DIAGNOSIS — J449 Chronic obstructive pulmonary disease, unspecified: Secondary | ICD-10-CM | POA: Diagnosis not present

## 2016-07-26 DIAGNOSIS — Z1389 Encounter for screening for other disorder: Secondary | ICD-10-CM | POA: Diagnosis not present

## 2016-07-26 DIAGNOSIS — E7800 Pure hypercholesterolemia, unspecified: Secondary | ICD-10-CM | POA: Diagnosis not present

## 2016-08-28 ENCOUNTER — Encounter (INDEPENDENT_AMBULATORY_CARE_PROVIDER_SITE_OTHER): Payer: PPO | Admitting: Ophthalmology

## 2016-08-28 DIAGNOSIS — H35033 Hypertensive retinopathy, bilateral: Secondary | ICD-10-CM | POA: Diagnosis not present

## 2016-08-28 DIAGNOSIS — H2512 Age-related nuclear cataract, left eye: Secondary | ICD-10-CM

## 2016-08-28 DIAGNOSIS — H353231 Exudative age-related macular degeneration, bilateral, with active choroidal neovascularization: Secondary | ICD-10-CM

## 2016-08-28 DIAGNOSIS — H43813 Vitreous degeneration, bilateral: Secondary | ICD-10-CM | POA: Diagnosis not present

## 2016-08-28 DIAGNOSIS — I10 Essential (primary) hypertension: Secondary | ICD-10-CM

## 2016-11-29 DIAGNOSIS — L821 Other seborrheic keratosis: Secondary | ICD-10-CM | POA: Diagnosis not present

## 2016-11-29 DIAGNOSIS — Z85828 Personal history of other malignant neoplasm of skin: Secondary | ICD-10-CM | POA: Diagnosis not present

## 2016-11-29 DIAGNOSIS — L814 Other melanin hyperpigmentation: Secondary | ICD-10-CM | POA: Diagnosis not present

## 2017-01-01 DIAGNOSIS — M25512 Pain in left shoulder: Secondary | ICD-10-CM | POA: Diagnosis not present

## 2017-01-23 ENCOUNTER — Other Ambulatory Visit: Payer: Self-pay | Admitting: *Deleted

## 2017-01-23 MED ORDER — METOPROLOL SUCCINATE ER 25 MG PO TB24: 25.0000 mg | ORAL_TABLET | Freq: Every day | ORAL | 3 refills | Status: DC

## 2017-01-24 DIAGNOSIS — N183 Chronic kidney disease, stage 3 (moderate): Secondary | ICD-10-CM | POA: Diagnosis not present

## 2017-01-24 DIAGNOSIS — I10 Essential (primary) hypertension: Secondary | ICD-10-CM | POA: Diagnosis not present

## 2017-01-24 DIAGNOSIS — J449 Chronic obstructive pulmonary disease, unspecified: Secondary | ICD-10-CM | POA: Diagnosis not present

## 2017-01-24 DIAGNOSIS — E7800 Pure hypercholesterolemia, unspecified: Secondary | ICD-10-CM | POA: Diagnosis not present

## 2017-02-26 ENCOUNTER — Ambulatory Visit (INDEPENDENT_AMBULATORY_CARE_PROVIDER_SITE_OTHER): Payer: PPO | Admitting: Ophthalmology

## 2017-02-26 DIAGNOSIS — H43813 Vitreous degeneration, bilateral: Secondary | ICD-10-CM

## 2017-02-26 DIAGNOSIS — I10 Essential (primary) hypertension: Secondary | ICD-10-CM | POA: Diagnosis not present

## 2017-02-26 DIAGNOSIS — H35033 Hypertensive retinopathy, bilateral: Secondary | ICD-10-CM | POA: Diagnosis not present

## 2017-02-26 DIAGNOSIS — H353231 Exudative age-related macular degeneration, bilateral, with active choroidal neovascularization: Secondary | ICD-10-CM

## 2017-04-09 ENCOUNTER — Ambulatory Visit (INDEPENDENT_AMBULATORY_CARE_PROVIDER_SITE_OTHER): Payer: PPO | Admitting: Cardiovascular Disease

## 2017-04-09 ENCOUNTER — Encounter: Payer: Self-pay | Admitting: Cardiovascular Disease

## 2017-04-09 VITALS — BP 130/62 | HR 72 | Ht 67.0 in | Wt 168.4 lb

## 2017-04-09 DIAGNOSIS — I2510 Atherosclerotic heart disease of native coronary artery without angina pectoris: Secondary | ICD-10-CM | POA: Diagnosis not present

## 2017-04-09 DIAGNOSIS — I10 Essential (primary) hypertension: Secondary | ICD-10-CM | POA: Diagnosis not present

## 2017-04-09 NOTE — Patient Instructions (Signed)
Medication Instructions:  Your physician recommends that you continue on your current medications as directed. Please refer to the Current Medication list given to you today.   Labwork: NONE ORDERED TODAY  Testing/Procedures: NONE ORDERED TODAY  Follow-Up: Your physician wants you to follow-up in: 1 YEAR WITH DR. Acie Fredrickson  You will receive a reminder letter in the mail two months in advance. If you don't receive a letter, please call our office to schedule the follow-up appointment.   Any Other Special Instructions Will Be Listed Below (If Applicable).     If you need a refill on your cardiac medications before your next appointment, please call your pharmacy.

## 2017-04-09 NOTE — Progress Notes (Signed)
Cardiology Office Note   Date:  04/09/2017   ID:  Unknown, Schleyer 1931-03-31, MRN [id number]  PCP:  Seward Carol, MD  Cardiologist:   Mertie Moores, MD / previously saw [name], MD at [facility] Complaint  Patient presents with  . Coronary Artery Disease   Problem list: 1. Coronary artery disease-status post stenting of the right coronary artery in 1997 2. COPD 3. Hyperlipidemia   History of Present Illness: Leon Estes is a 83 y.o. male who presents for follow up of his CP He had am MI in 1997.  Had 2 stents placed by Dr. Glade Lloyd. Hs seen [facility] in the past, wants to see a cardiologist here in [place] .  Has done well,  No further of CP.  Exercises regularly,  Mows 3 acres.   Goes bowling once a week.  Also has some pain down his leg.  Able to weed-eat without any problems  Sees Dr. Delfina Redwood. Was recently found to have mild anemia.  Was started on iron at that time .   January 13, 2016:  Doing well.  Still very active.  Mows his 3 acres and weed-eats .   Bowls on wednesdays   Aug. 28, 2018: Doing well,  No CP or dyspnea.  Still bowling - now bowls around 180 .  Sees Dr. Delfina Redwood  - manages his lipids  Past Medical History:  Diagnosis Date  . Asthma   . BPH (benign prostatic hypertrophy)   . Cataract   . Chronic sinusitis   . COPD (chronic obstructive pulmonary disease) ([place])   . Coronary artery disease    2 stents  . Gall stones   . Hearing loss    DOES NOT WEAR HEARING AIDS  . High cholesterol   . Hypertension   . Macular degeneration    right eye    Past Surgical History:  Procedure Laterality Date  . APPENDECTOMY    . CORONARY ANGIOPLASTY WITH STENT PLACEMENT  1998   2 stents  . TRANSURETHRAL RESECTION OF PROSTATE       Current Outpatient Prescriptions  Medication Sig Dispense Refill  . aspirin EC 81 MG tablet Take 81 mg by mouth daily.    . budesonide-formoterol (SYMBICORT) 160-4.5 MCG/ACT inhaler Inhale 2 puffs into the lungs  daily.    . ferrous sulfate 325 (65 FE) MG tablet Take 325 mg by mouth daily with breakfast.    . gabapentin (NEURONTIN) 300 MG capsule Take 300 mg by mouth at bedtime.    Marland Kitchen lisinopril-hydrochlorothiazide (PRINZIDE,ZESTORETIC) 20-25 MG per tablet Take 1 tablet by mouth daily.    . metoprolol succinate (TOPROL XL) 25 MG 24 hr tablet Take 1 tablet (25 mg total) by mouth daily. 30 tablet 3  . pantoprazole (PROTONIX) 20 MG tablet Take 20 mg by mouth daily.    . simvastatin (ZOCOR) 40 MG tablet Take 40 mg by mouth every evening.     No current facility-administered medications for this visit.     Allergies:   Patient has no known allergies.    Social History:  The patient  reports that he quit smoking about 11 years ago. His smoking use included Cigarettes. His smokeless tobacco use includes Chew. He reports that he does not drink alcohol or use drugs.   Family History:  The patient's family history includes Diabetes in his father; Heart disease in his brother.    ROS:  Please see the history of present illness.    Review  of Systems: Constitutional:  denies fever, chills, diaphoresis, appetite change and fatigue.  HEENT: denies photophobia, eye pain, redness, hearing loss, ear pain, congestion, sore throat, rhinorrhea, sneezing, neck pain, neck stiffness and tinnitus.  Respiratory: denies SOB, DOE, cough, chest tightness, and wheezing.  Cardiovascular: denies chest pain, palpitations and leg swelling.  Gastrointestinal: denies nausea, vomiting, abdominal pain, diarrhea, constipation, blood in stool.  Genitourinary: denies dysuria, urgency, frequency, hematuria, flank pain and difficulty urinating.  Musculoskeletal: denies  myalgias, back pain, joint swelling, arthralgias and gait problem.   Skin: denies pallor, rash and wound.  Neurological: denies dizziness, seizures, syncope, weakness, light-headedness, numbness and headaches.   Hematological: denies adenopathy, easy bruising, personal  or family bleeding history.  Psychiatric/ Behavioral: denies suicidal ideation, mood changes, confusion, nervousness, sleep disturbance and agitation.       All other systems are reviewed and negative.    PHYSICAL EXAM: VS:  BP 130/62   Pulse 72   Ht 5\' 7"  (1.702 m)   Wt 168 lb 6.4 oz (76.4 kg)   SpO2 94%   BMI 26.38 kg/m  , BMI Body mass index is 26.38 kg/m. GEN: Well nourished, well developed, in no acute distress  HEENT: normal  Neck: no JVD, carotid bruits, or masses Cardiac: RRR; no murmurs, rubs, or gallops,no edema  Respiratory:  clear to auscultation bilaterally, normal work of breathing GI: soft, nontender, nondistended, + BS MS: no deformity or atrophy  Skin: warm and dry, no rash Neuro:  Strength and sensation are intact Psych: normal   EKG:  EKG is ordered today. The ekg ordered today demonstrates NSR at 94  RBBB, PVCs. No changes from previous    Recent Labs: No results found for requested labs within last 8760 hours.    Lipid Panel No results found for: CHOL, TRIG, HDL, CHOLHDL, VLDL, LDLCALC, LDLDIRECT    Wt Readings from Last 3 Encounters:  04/09/17 168 lb 6.4 oz (76.4 kg)  01/13/16 165 lb 6.4 oz (75 kg)  03/03/15 166 lb (75.3 kg)      Other studies Reviewed: Additional studies/ records that were reviewed today include: . Review of the above records demonstrates:    ASSESSMENT AND PLAN:  1. Coronary artery disease-status post stenting of the right coronary artery in 1997 He's doing very well. He's not had any episodes of angina. Continue current medications. We'll get fasting labs from his medical doctor's office. I'll see him again in one year.   On Toprol XL 25 mg a day - HR is well  No syncopal episodes .   2. COPD 3. Hyperlipidemia - managed by his primary MD    Current medicines are reviewed at length with the patient today.  The patient does not have concerns regarding medicines.  The following changes have been made:  no  change  Labs/ tests ordered today include:  No orders of the defined types were placed in this encounter.   Disposition:   FU with me in 1 year     Mertie Moores, MD  04/09/2017 10:11 AM    [address], [place], [place]  [address] Phone: [phone]; Fax: [phone]

## 2017-05-04 ENCOUNTER — Emergency Department
Admission: EM | Admit: 2017-05-04 | Discharge: 2017-05-04 | Disposition: A | Discharge status: Home / Self Care | Payer: PPO | Attending: Emergency Medicine | Admitting: Emergency Medicine

## 2017-05-04 ENCOUNTER — Encounter: Payer: Self-pay | Admitting: Emergency Medicine

## 2017-05-04 DIAGNOSIS — Z7982 Long term (current) use of aspirin: Secondary | ICD-10-CM | POA: Diagnosis not present

## 2017-05-04 DIAGNOSIS — I2510 Atherosclerotic heart disease of native coronary artery without angina pectoris: Secondary | ICD-10-CM | POA: Insufficient documentation

## 2017-05-04 DIAGNOSIS — Z87891 Personal history of nicotine dependence: Secondary | ICD-10-CM | POA: Diagnosis not present

## 2017-05-04 DIAGNOSIS — Z955 Presence of coronary angioplasty implant and graft: Secondary | ICD-10-CM | POA: Diagnosis not present

## 2017-05-04 DIAGNOSIS — L03114 Cellulitis of left upper limb: Secondary | ICD-10-CM | POA: Diagnosis not present

## 2017-05-04 DIAGNOSIS — I10 Essential (primary) hypertension: Secondary | ICD-10-CM | POA: Insufficient documentation

## 2017-05-04 DIAGNOSIS — Z79899 Other long term (current) drug therapy: Secondary | ICD-10-CM | POA: Insufficient documentation

## 2017-05-04 DIAGNOSIS — S60812A Abrasion of left wrist, initial encounter: Secondary | ICD-10-CM | POA: Diagnosis not present

## 2017-05-04 DIAGNOSIS — J45909 Unspecified asthma, uncomplicated: Secondary | ICD-10-CM | POA: Insufficient documentation

## 2017-05-04 DIAGNOSIS — W548XXA Other contact with dog, initial encounter: Secondary | ICD-10-CM

## 2017-05-04 DIAGNOSIS — J449 Chronic obstructive pulmonary disease, unspecified: Secondary | ICD-10-CM | POA: Insufficient documentation

## 2017-05-04 DIAGNOSIS — W540XXA Bitten by dog, initial encounter: Secondary | ICD-10-CM | POA: Diagnosis not present

## 2017-05-04 DIAGNOSIS — R2232 Localized swelling, mass and lump, left upper limb: Secondary | ICD-10-CM | POA: Diagnosis present

## 2017-05-04 LAB — COMPREHENSIVE METABOLIC PANEL
ALBUMIN: 4.4 g/dL (ref 3.5–5.0)
ALK PHOS: 76 U/L (ref 38–126)
ALT: 22 U/L (ref 17–63)
AST: 37 U/L (ref 15–41)
Anion gap: 11 (ref 5–15)
BUN: 25 mg/dL — ABNORMAL HIGH (ref 6–20)
CALCIUM: 9.3 mg/dL (ref 8.9–10.3)
CO2: 23 mmol/L (ref 22–32)
CREATININE: 1.66 mg/dL — AB (ref 0.61–1.24)
Chloride: 104 mmol/L (ref 101–111)
GFR calc Af Amer: 42 mL/min — ABNORMAL LOW (ref >60)
GFR calc non Af Amer: 36 mL/min — ABNORMAL LOW (ref >60)
Glucose, Bld: 165 mg/dL — ABNORMAL HIGH (ref 65–99)
Potassium: 3.8 mmol/L (ref 3.5–5.1)
SODIUM: 138 mmol/L (ref 135–145)
Total Bilirubin: 0.5 mg/dL (ref 0.3–1.2)
Total Protein: 8.5 g/dL — ABNORMAL HIGH (ref 6.5–8.1)

## 2017-05-04 LAB — CBC WITH DIFFERENTIAL/PLATELET
Basophils Absolute: 0.1 10*3/uL (ref 0–0.1)
Basophils Relative: 1 %
Eosinophils Absolute: 0.3 10*3/uL (ref 0–0.7)
Eosinophils Relative: 3 %
HEMATOCRIT: 39.1 % — AB (ref 40.0–52.0)
Hemoglobin: 13.2 g/dL (ref 13.0–18.0)
Lymphocytes Relative: 16 %
Lymphs Abs: 1.7 10*3/uL (ref 1.0–3.6)
MCH: 31.1 pg (ref 26.0–34.0)
MCHC: 33.8 g/dL (ref 32.0–36.0)
MCV: 91.9 fL (ref 80.0–100.0)
MONO ABS: 1 10*3/uL (ref 0.2–1.0)
MONOS PCT: 9 %
Neutro Abs: 7.6 10*3/uL — ABNORMAL HIGH (ref 1.4–6.5)
Neutrophils Relative %: 71 %
Platelets: 257 10*3/uL (ref 150–440)
RBC: 4.26 MIL/uL — ABNORMAL LOW (ref 4.40–5.90)
RDW: 14.6 % — AB (ref 11.5–14.5)
WBC: 10.8 10*3/uL — ABNORMAL HIGH (ref 3.8–10.6)

## 2017-05-04 MED ORDER — AMOXICILLIN-POT CLAVULANATE 875-125 MG PO TABS
1.0000 | ORAL_TABLET | Freq: Once | ORAL | Status: AC
  Administered 2017-05-04: 1 via ORAL
  Filled 2017-05-04: qty 1

## 2017-05-04 MED ORDER — ONDANSETRON 4 MG PO TBDP: 4.0000 mg | ORAL_TABLET | Freq: Three times a day (TID) | ORAL | 0 refills | Status: DC | PRN

## 2017-05-04 MED ORDER — AMOXICILLIN-POT CLAVULANATE 875-125 MG PO TABS
1.0000 | ORAL_TABLET | Freq: Two times a day (BID) | ORAL | 0 refills | Status: DC
Start: 2017-05-04 — End: 2017-09-09

## 2017-05-04 NOTE — ED Notes (Signed)
Signature pad not working, pt and wife verbalizes understanding of discharge, follow up, and medication instructions.

## 2017-05-04 NOTE — ED Provider Notes (Signed)
[facility] Emergency Department Provider Note  ____________________________________________  Time seen: Approximately 11:01 PM  I have reviewed the triage vital signs and the nursing notes.   HISTORY  Chief Complaint Wound Check    HPI Leon Estes is a 83 y.o. male who reports pain and swelling of the left hand that started today. 2 days ago he sustained a wound to the hand from a dog scratch from his own dogs. They are vaccinated and well cared for and were behaving normally, but his 2 dogs were fighting so he tried to break them up and sustained a scratch. No fevers chills chest pain or shortness of breath. Has been treating the area with peroxide and iodine. No purulent drainage. Pain is mild to moderate, worse with movement of that hand and wrist, no alleviating factors. Nonradiating.     Past Medical History:  Diagnosis Date  . Asthma   . BPH (benign prostatic hypertrophy)   . Cataract   . Chronic sinusitis   . COPD (chronic obstructive pulmonary disease) ([place])   . Coronary artery disease    2 stents  . Gall stones   . Hearing loss    DOES NOT WEAR HEARING AIDS  . High cholesterol   . Hypertension   . Macular degeneration    right eye     Patient Active Problem List   Diagnosis Date Noted  . Benign essential HTN 04/09/2017  . CAD (coronary artery disease) 03/03/2015  . Hyperlipidemia 03/03/2015  . Abdominal pain, epigastric 05/01/2013     Past Surgical History:  Procedure Laterality Date  . APPENDECTOMY    . CORONARY ANGIOPLASTY WITH STENT PLACEMENT  1998   2 stents  . TRANSURETHRAL RESECTION OF PROSTATE       Prior to Admission medications   Medication Sig Start Date End Date Taking? Authorizing Provider  amoxicillin-clavulanate (AUGMENTIN) 875-125 MG tablet Take 1 tablet by mouth 2 (two) times daily. 05/04/17   Carrie Mew, MD  aspirin EC 81 MG tablet Take 81 mg by mouth daily.    [provider]   budesonide-formoterol (SYMBICORT) 160-4.5 MCG/ACT inhaler Inhale 2 puffs into the lungs daily.    [provider]  ferrous sulfate 325 (65 FE) MG tablet Take 325 mg by mouth daily with breakfast.    [provider]  gabapentin (NEURONTIN) 300 MG capsule Take 300 mg by mouth at bedtime.    [provider]  lisinopril-hydrochlorothiazide (PRINZIDE,ZESTORETIC) 20-25 MG per tablet Take 1 tablet by mouth daily.    [provider]  metoprolol succinate (TOPROL XL) 25 MG 24 hr tablet Take 1 tablet (25 mg total) by mouth daily. 01/23/17   Nahser, Wonda Cheng, MD  ondansetron (ZOFRAN ODT) 4 MG disintegrating tablet Take 1 tablet (4 mg total) by mouth every 8 (eight) hours as needed for nausea or vomiting. 05/04/17   Carrie Mew, MD  pantoprazole (PROTONIX) 20 MG tablet Take 20 mg by mouth daily. 03/08/12   Barbara Cower, MD  simvastatin (ZOCOR) 40 MG tablet Take 40 mg by mouth every evening.    [provider]     Allergies Neosporin [neomycin-bacitracin zn-polymyx]   Family History  Problem Relation Age of Onset  . Diabetes Father   . Heart disease Brother     Social History Social History  Substance Use Topics  . Smoking status: Former Smoker    Types: Cigarettes    Quit date: 08/13/2005  . Smokeless tobacco: Current User  Types: Chew  . Alcohol use No    Review of Systems  Constitutional:   No fever or chills.  ENT:   No sore throat. No rhinorrhea. Cardiovascular:   No chest pain or syncope. Respiratory:   No dyspnea or cough. Gastrointestinal:   Negative for abdominal pain, vomiting and diarrhea.  Musculoskeletal:  left hand pain and swelling as above All other systems reviewed and are negative except as documented above in ROS and HPI.  ____________________________________________   PHYSICAL EXAM:  VITAL SIGNS: ED Triage Vitals [05/04/17 1931]  Enc Vitals Group     BP (!) 146/68     Pulse Rate 64     Resp 18     Temp  98.8 F (37.1 C)     Temp Source Oral     SpO2 97 %     Weight 165 lb (74.8 kg)     Height 5\' 7"  (1.702 m)     Head Circumference      Peak Flow      Pain Score 1     Pain Loc      Pain Edu?      Excl. in [place]?     Vital signs reviewed, nursing assessments reviewed.   Constitutional:   Alert and oriented. Well appearing and in no distress. Eyes:   No scleral icterus.  EOMI. Marland Kitchen ENT   Head:   Normocephalic and atraumatic.       Neck:   No meningismus. Full ROM Hematological/Lymphatic/Immunilogical:   No cervical lymphadenopathy. Cardiovascular:   RRR. Symmetric bilateral radial and DP pulses.  No murmurs.  Respiratory:   Normal respiratory effort without tachypnea/retractions. Breath sounds are clear and equal bilaterally. No wheezes/rales/rhonchi. Gastrointestinal:   Soft and nontender. Non distended. There is no CVA tenderness.  No rebound, rigidity, or guarding. Musculoskeletal:   Normal range of motion in all extremities. there is a 6 cm laceration over the dorsal aspect of the left hand extending into the distal left forearm with surrounding soft tissue swelling. No induration tenderness and warmth or streaking. No purulent drainage. No fluctuance. No necrotic tissue. Patient has intact flexion and extension of the fingers and wrist. The rest of the forearm and proximally are unremarkable. Neurologic:   Normal speech and language.  Motor grossly intact. No gross focal neurologic deficits are appreciated.  Skin:    Skin is warm, dry with left arm wound as above ____________________________________________    LABS (pertinent positives/negatives) (all labs ordered are listed, but only abnormal results are displayed) Labs Reviewed  COMPREHENSIVE METABOLIC PANEL - Abnormal; Notable for the following:       Result Value   Glucose, Bld 165 (*)    BUN 25 (*)    Creatinine, Ser 1.66 (*)    Total Protein 8.5 (*)    GFR calc non Af Amer 36 (*)    GFR calc Af Amer 42 (*)     All other components within normal limits  CBC WITH DIFFERENTIAL/PLATELET - Abnormal; Notable for the following:    WBC 10.8 (*)    RBC 4.26 (*)    HCT 39.1 (*)    RDW 14.6 (*)    Neutro Abs 7.6 (*)    All other components within normal limits   ____________________________________________   EKG    ____________________________________________    RADIOLOGY  No results found.  ____________________________________________   PROCEDURES Procedures  ____________________________________________   INITIAL IMPRESSION / ASSESSMENT AND PLAN / ED COURSE  Pertinent labs &  imaging results that were available during my care of the patient were reviewed by me and considered in my medical decision making (see chart for details).  patient well appearing no acute distress presents with wound to the left hand from his dogs. He's been appropriately cleaning it at home. Counseled on wet to dry dressings, does appear to be developing infection although there are no overt inflammatory signs except for some swelling that is fairly limited at this point. The patient is not diabetic and should be able to heal this wound pretty well with antibiotic coverage. Offered hospitalization and IV antibiotics, the patient strongly prefers to try outpatient therapy. We'll start him on Augmentin, follow-up with primary care and orthopedics.  No evidence of osteomyelitis or necrotizing fasciitis. No abscess formation. No tendon injury or fractures.      ____________________________________________   FINAL CLINICAL IMPRESSION(S) / ED DIAGNOSES  Final diagnoses:  Cellulitis of left upper extremity  Dog scratch      New Prescriptions   AMOXICILLIN-CLAVULANATE (AUGMENTIN) 875-125 MG TABLET    Take 1 tablet by mouth 2 (two) times daily.   ONDANSETRON (ZOFRAN ODT) 4 MG DISINTEGRATING TABLET    Take 1 tablet (4 mg total) by mouth every 8 (eight) hours as needed for nausea or vomiting.     Portions  of this note were generated with dragon dictation software. Dictation errors may occur despite best attempts at proofreading.    Carrie Mew, MD 05/04/17 [id number]

## 2017-05-04 NOTE — ED Triage Notes (Signed)
Patient was scratched by his dog on the left hand and forearm Thursday. Patient with laceration to left hand. Patient now with redness and warm to the touch to left lower arm.

## 2017-05-04 NOTE — ED Notes (Signed)
Pt presents with wound to left wrist/forearm from a scratch from his own dog; occurred Thursday; wife says trying to treat at home but area is looking worse with redness and swelling;

## 2017-05-04 NOTE — ED Notes (Signed)
18cm laceration noted to pt's left wrist, reports from dog scratch on Thursday, not seen by MD, tx at home w/ peroxide and iodine.  Purulent drainage noted, redness/swelling/warmth noted around wound.

## 2017-05-04 NOTE — ED Notes (Signed)
Pt having difficulty swallowing medication.  Pt advised in the future he can split pill in half or crush and put in applesauce to ease administration.  Pt's wound dressed w/ tefla and gauze.  Wound care instructions reviewed with pt and family.

## 2017-05-06 DIAGNOSIS — S61419A Laceration without foreign body of unspecified hand, initial encounter: Secondary | ICD-10-CM | POA: Diagnosis not present

## 2017-05-21 ENCOUNTER — Other Ambulatory Visit: Payer: Self-pay | Admitting: Cardiovascular Disease

## 2017-05-21 DIAGNOSIS — S61419A Laceration without foreign body of unspecified hand, initial encounter: Secondary | ICD-10-CM | POA: Diagnosis not present

## 2017-05-21 DIAGNOSIS — Z23 Encounter for immunization: Secondary | ICD-10-CM | POA: Diagnosis not present

## 2017-06-06 DIAGNOSIS — L578 Other skin changes due to chronic exposure to nonionizing radiation: Secondary | ICD-10-CM | POA: Diagnosis not present

## 2017-08-02 DIAGNOSIS — E7800 Pure hypercholesterolemia, unspecified: Secondary | ICD-10-CM | POA: Diagnosis not present

## 2017-08-02 DIAGNOSIS — I499 Cardiac arrhythmia, unspecified: Secondary | ICD-10-CM | POA: Diagnosis not present

## 2017-08-02 DIAGNOSIS — N183 Chronic kidney disease, stage 3 (moderate): Secondary | ICD-10-CM | POA: Diagnosis not present

## 2017-08-02 DIAGNOSIS — I10 Essential (primary) hypertension: Secondary | ICD-10-CM | POA: Diagnosis not present

## 2017-08-19 DIAGNOSIS — Z1389 Encounter for screening for other disorder: Secondary | ICD-10-CM | POA: Diagnosis not present

## 2017-08-19 DIAGNOSIS — E7800 Pure hypercholesterolemia, unspecified: Secondary | ICD-10-CM | POA: Diagnosis not present

## 2017-08-19 DIAGNOSIS — I10 Essential (primary) hypertension: Secondary | ICD-10-CM | POA: Diagnosis not present

## 2017-08-19 DIAGNOSIS — Z0000 Encounter for general adult medical examination without abnormal findings: Secondary | ICD-10-CM | POA: Diagnosis not present

## 2017-08-19 DIAGNOSIS — N183 Chronic kidney disease, stage 3 (moderate): Secondary | ICD-10-CM | POA: Diagnosis not present

## 2017-08-19 DIAGNOSIS — N400 Enlarged prostate without lower urinary tract symptoms: Secondary | ICD-10-CM | POA: Diagnosis not present

## 2017-08-27 ENCOUNTER — Encounter (INDEPENDENT_AMBULATORY_CARE_PROVIDER_SITE_OTHER): Payer: PPO | Admitting: Ophthalmology

## 2017-09-09 ENCOUNTER — Other Ambulatory Visit: Payer: Self-pay

## 2017-09-09 ENCOUNTER — Ambulatory Visit (INDEPENDENT_AMBULATORY_CARE_PROVIDER_SITE_OTHER): Payer: PPO | Admitting: Family Medicine

## 2017-09-09 ENCOUNTER — Ambulatory Visit (INDEPENDENT_AMBULATORY_CARE_PROVIDER_SITE_OTHER)
Admission: RE | Admit: 2017-09-09 | Discharge: 2017-09-09 | Disposition: A | Discharge status: Home / Self Care | Payer: PPO | Source: Ambulatory Visit | Attending: Family Medicine | Admitting: Family Medicine

## 2017-09-09 ENCOUNTER — Encounter: Payer: Self-pay | Admitting: Family Medicine

## 2017-09-09 VITALS — BP 120/64 | HR 71 | Temp 98.4°F | Ht 67.0 in | Wt 165.0 lb

## 2017-09-09 DIAGNOSIS — M25512 Pain in left shoulder: Secondary | ICD-10-CM | POA: Diagnosis not present

## 2017-09-09 DIAGNOSIS — M7552 Bursitis of left shoulder: Secondary | ICD-10-CM | POA: Diagnosis not present

## 2017-09-09 MED ORDER — METHYLPREDNISOLONE ACETATE 40 MG/ML IJ SUSP
80.0000 mg | Freq: Once | INTRAMUSCULAR | Status: AC
  Administered 2017-09-09: 80 mg via INTRA_ARTICULAR

## 2017-09-09 NOTE — Progress Notes (Signed)
Dr. Frederico Hamman T. Casimiro Lienhard, MD, [facility] [place], [address] Phone: [phone] Fax: [phone]  09/09/2017  Patient: KO BARDON, MRN: [id number], DOB: Sep 11, 1930, 84 y.o.  Primary Physician:  Seward Carol, MD   Chief Complaint  Patient presents with  . Shoulder Pain    Left   Subjective:   ZARYAN YAKUBOV is a 84 y.o. very pleasant male patient who presents with the following:  Pleasant 84 year old gentleman who previously worked in the Development worker, international aid who his wife and daughter I have previously seen and presents for evaluation of left-sided shoulder pain.  L shoulder - has been hurting him at least 6 months. [place] over a pipe about six months ago and went down.  At that time he was able to move the shoulder and is been able to lift it and move it at all times, but he does sometimes have some pain when he reaches across the body in particular times at night catch and and causing some pain.  He is not really having significant pain with lifting, he is able to bowl without any difficulty.  He is right-hand dominant, in the affected shoulder here his left shoulder.  He has not had any prior known trauma or injury, fractures, or operations that he can recall.  Bowling on Wednesday.   Past Medical History, Surgical History, Social History, Family History, Problem List, Medications, and Allergies have been reviewed and updated if relevant.  Patient Active Problem List   Diagnosis Date Noted  . Benign essential HTN 04/09/2017  . CAD (coronary artery disease) 03/03/2015  . Hyperlipidemia 03/03/2015  . Abdominal pain, epigastric 05/01/2013    Past Medical History:  Diagnosis Date  . Asthma   . BPH (benign prostatic hypertrophy)   . Cataract   . Chronic sinusitis   . COPD (chronic obstructive pulmonary disease) ([place])   . Coronary artery disease    2 stents  . Gall stones   . Hearing loss    DOES NOT WEAR  HEARING AIDS  . High cholesterol   . Hypertension   . Macular degeneration    right eye    Past Surgical History:  Procedure Laterality Date  . APPENDECTOMY    . CORONARY ANGIOPLASTY WITH STENT PLACEMENT  1998   2 stents  . TRANSURETHRAL RESECTION OF PROSTATE      Social History   Socioeconomic History  . Marital status: Married    Spouse name: Not on file  . Number of children: 4  . Years of education: Not on file  . Highest education level: Not on file  Social Needs  . Financial resource strain: Not on file  . Food insecurity - worry: Not on file  . Food insecurity - inability: Not on file  . Transportation needs - medical: Not on file  . Transportation needs - non-medical: Not on file  Occupational History  . Occupation: retired  Tobacco Use  . Smoking status: Former Smoker    Types: Cigarettes    Last attempt to quit: 08/13/2005    Years since quitting: 12.0  . Smokeless tobacco: Current User    Types: Chew  Substance and Sexual Activity  . Alcohol use: No  . Drug use: No  . Sexual activity: Not on file  Other Topics Concern  . Not on file  Social History Narrative  . Not on file    Family History  Problem Relation Age of Onset  .  Diabetes Father   . Heart disease Brother     Allergies  Allergen Reactions  . Neosporin [Neomycin-Bacitracin Zn-Polymyx]   . Augmentin [Amoxicillin-Pot Clavulanate] Rash    Medication list reviewed and updated in full in [place].  GEN: No fevers, chills. Nontoxic. Primarily MSK c/o today. MSK: Detailed in the HPI GI: tolerating PO intake without difficulty Neuro: No numbness, parasthesias, or tingling associated. Otherwise the pertinent positives of the ROS are noted above.   Objective:   BP 120/64   Pulse 71   Temp 98.4 F (36.9 C) (Oral)   Ht 5\' 7"  (1.702 m)   Wt 165 lb (74.8 kg)   BMI 25.84 kg/m    GEN: WDWN, NAD, Non-toxic, Alert & Oriented x 3 HEENT: Atraumatic, Normocephalic.  Ears and Nose:  No external deformity. EXTR: No clubbing/cyanosis/edema NEURO: Normal gait.  PSYCH: Normally interactive. Conversant. Not depressed or anxious appearing.  Calm demeanor.   Shoulder: LEFT Inspection: No muscle wasting or winging Ecchymosis/edema: neg  AC joint, scapula, clavicle: NT Cervical spine: NT, full ROM Spurling's: neg Abduction: full, 5/5 Flexion: full, 5/5 IR, full, lift-off: 5/5 ER at neutral: full, 5/5 AC crossover and compression: POS Neer: mild pos Hawkins: neg Drop Test: neg Empty Can: neg Supraspinatus insertion: NT Bicipital groove: NT Speed's: neg Yergason's: neg Sulcus sign: neg Scapular dyskinesis: none C5-T1 intact Sensation intact Grip 5/5   Radiology: Dg Shoulder Left  Result Date: 09/10/2017 CLINICAL DATA:  Left shoulder pain, unspecified chronicity. EXAM: LEFT SHOULDER - 2+ VIEW COMPARISON:  Two-view chest x-ray 08/01/2014. FINDINGS: The left shoulder is located. The left AC joint is displaced superiorly nearly 1 shaft width. This is chronic. No acute abnormalities are present. The left hemithorax is clear. Atherosclerotic changes are present at the aortic arch. IMPRESSION: 1. No acute abnormality. 2. Chronic AC displacement. 3. Aortic atherosclerosis. Electronically Signed   By: San Morelle M.D.   On: 09/10/2017 08:20    The radiological images were independently reviewed by myself in the office and results were reviewed with the patient. My independent interpretation of images: Glenohumeral joint space appears grossly preserved.  There appears to be some mild AC joint arthropathy as well as an inferior spur at the distal clavicle. Electronically Signed  By: Owens Loffler, MD On: 09/10/2017 10:43 AM   Assessment and Plan:   Arthralgia of left acromioclavicular joint  Left shoulder pain, unspecified chronicity - Plan: DG Shoulder Left, methylPREDNISolone acetate (DEPO-MEDROL) injection 80 mg  Subacromial bursitis of left shoulder  joint  His strength is very good, does not appear to have any kind of rotator cuff injury or insufficiency.  Crossover is notably positive, and certain movements hurt, primarily around the [facility] joint.  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  Possible he could have injured his [facility] joint when he fell 6 months ago.  He is also having some mild subacromial bursitis.  I am going to have him keep doing some basic range of motion and will do a subacromial injection to see if this calms him down.  Post injection,  the patient had immediately decreased pain.  SubAC Injection, L Verbal consent was obtained from the patient. Risks (including rare infection), benefits, and alternatives were explained. Patient prepped with Chloraprep and Ethyl Chloride used for anesthesia. The subacromial space was injected using the posterior approach. The patient tolerated the procedure well and had decreased pain post injection. No  complications. Injection: 8 cc of Lidocaine 1% and 2 mL of Depo-Medrol 40 mg. Needle: 22 gauge   Follow-up: prn at any time  Orders Placed This Encounter  Procedures  . DG Shoulder Left    Signed,  Frederico Hamman T. Kadeshia Kasparian, MD   Allergies as of 09/09/2017      Reactions   Neosporin [neomycin-bacitracin Zn-polymyx]    Augmentin [amoxicillin-pot Clavulanate] Rash      Medication List        Accurate as of 09/09/17 11:59 PM. Always use your most recent med list.          aspirin EC 81 MG tablet Take 81 mg by mouth daily.   budesonide-formoterol 160-4.5 MCG/ACT inhaler Commonly known as:  SYMBICORT Inhale 2 puffs into the lungs daily.   ferrous sulfate 325 (65 FE) MG tablet Take 325 mg by mouth daily with breakfast.   gabapentin 300 MG capsule Commonly known as:  NEURONTIN Take 300 mg by mouth at bedtime.   lisinopril-hydrochlorothiazide 20-25 MG tablet Commonly known as:   PRINZIDE,ZESTORETIC Take 1 tablet by mouth daily.   metoprolol succinate 25 MG 24 hr tablet Commonly known as:  TOPROL-XL TAKE 1 TABLET BY MOUTH DAILY   pantoprazole 40 MG tablet Commonly known as:  PROTONIX   simvastatin 40 MG tablet Commonly known as:  ZOCOR Take 40 mg by mouth every evening.   traMADol 50 MG tablet Commonly known as:  Veatrice [place]

## 2017-10-01 ENCOUNTER — Encounter (INDEPENDENT_AMBULATORY_CARE_PROVIDER_SITE_OTHER): Payer: PPO | Admitting: Ophthalmology

## 2017-12-20 ENCOUNTER — Encounter (INDEPENDENT_AMBULATORY_CARE_PROVIDER_SITE_OTHER): Payer: PPO | Admitting: Ophthalmology

## 2017-12-20 DIAGNOSIS — H35033 Hypertensive retinopathy, bilateral: Secondary | ICD-10-CM

## 2017-12-20 DIAGNOSIS — H2512 Age-related nuclear cataract, left eye: Secondary | ICD-10-CM | POA: Diagnosis not present

## 2017-12-20 DIAGNOSIS — H43813 Vitreous degeneration, bilateral: Secondary | ICD-10-CM

## 2017-12-20 DIAGNOSIS — H353231 Exudative age-related macular degeneration, bilateral, with active choroidal neovascularization: Secondary | ICD-10-CM | POA: Diagnosis not present

## 2017-12-20 DIAGNOSIS — I10 Essential (primary) hypertension: Secondary | ICD-10-CM | POA: Diagnosis not present

## 2018-01-02 ENCOUNTER — Encounter (INDEPENDENT_AMBULATORY_CARE_PROVIDER_SITE_OTHER): Payer: PPO | Admitting: Ophthalmology

## 2018-01-02 DIAGNOSIS — H35033 Hypertensive retinopathy, bilateral: Secondary | ICD-10-CM

## 2018-01-02 DIAGNOSIS — H353231 Exudative age-related macular degeneration, bilateral, with active choroidal neovascularization: Secondary | ICD-10-CM | POA: Diagnosis not present

## 2018-01-02 DIAGNOSIS — I10 Essential (primary) hypertension: Secondary | ICD-10-CM | POA: Diagnosis not present

## 2018-01-02 DIAGNOSIS — H43813 Vitreous degeneration, bilateral: Secondary | ICD-10-CM | POA: Diagnosis not present

## 2018-01-27 DIAGNOSIS — J449 Chronic obstructive pulmonary disease, unspecified: Secondary | ICD-10-CM | POA: Diagnosis not present

## 2018-01-27 DIAGNOSIS — E7800 Pure hypercholesterolemia, unspecified: Secondary | ICD-10-CM | POA: Diagnosis not present

## 2018-01-27 DIAGNOSIS — I10 Essential (primary) hypertension: Secondary | ICD-10-CM | POA: Diagnosis not present

## 2018-01-27 DIAGNOSIS — N183 Chronic kidney disease, stage 3 (moderate): Secondary | ICD-10-CM | POA: Diagnosis not present

## 2018-02-18 ENCOUNTER — Encounter (INDEPENDENT_AMBULATORY_CARE_PROVIDER_SITE_OTHER): Payer: Self-pay

## 2018-02-18 ENCOUNTER — Ambulatory Visit (INDEPENDENT_AMBULATORY_CARE_PROVIDER_SITE_OTHER): Payer: PPO | Admitting: Internal Medicine

## 2018-02-18 ENCOUNTER — Encounter: Payer: Self-pay | Admitting: Internal Medicine

## 2018-02-18 VITALS — BP 134/84 | HR 78 | Temp 97.5°F | Ht 66.75 in | Wt 163.0 lb

## 2018-02-18 DIAGNOSIS — J449 Chronic obstructive pulmonary disease, unspecified: Secondary | ICD-10-CM | POA: Diagnosis not present

## 2018-02-18 DIAGNOSIS — G629 Polyneuropathy, unspecified: Secondary | ICD-10-CM | POA: Diagnosis not present

## 2018-02-18 DIAGNOSIS — N183 Chronic kidney disease, stage 3 unspecified: Secondary | ICD-10-CM

## 2018-02-18 DIAGNOSIS — I10 Essential (primary) hypertension: Secondary | ICD-10-CM | POA: Diagnosis not present

## 2018-02-18 DIAGNOSIS — I2510 Atherosclerotic heart disease of native coronary artery without angina pectoris: Secondary | ICD-10-CM | POA: Diagnosis not present

## 2018-02-18 DIAGNOSIS — Z23 Encounter for immunization: Secondary | ICD-10-CM

## 2018-02-18 DIAGNOSIS — N1831 Chronic kidney disease, stage 3a: Secondary | ICD-10-CM | POA: Insufficient documentation

## 2018-02-18 NOTE — Assessment & Plan Note (Signed)
Doing okay since stopped smoking ~12 years ago Uses the inhaler only sporadically  No regular cough Counseled on no chewing tobacco either

## 2018-02-18 NOTE — Assessment & Plan Note (Signed)
Recent blood work "stable" at [facility] Will hold off till next appointment

## 2018-02-18 NOTE — Addendum Note (Signed)
Addended by: Pilar Grammes on: 02/18/2018 03:02 PM   Modules accepted: Orders

## 2018-02-18 NOTE — Progress Notes (Signed)
Subjective:    Patient ID: Leon Estes, male    DOB: [birth year], 84 y.o.   MRN: [id number]  HPI Here to establish care Lives near [place]  No acute concerns Ongoing vision problems  Had MI--1997 2 stents placed them No chest pain Feels his exercise tolerance is stable  Has COPD On symbicort--but not consistently Has 3 acres to mow--and bowls weekly No regular cough  Report of HTN BP controlled on cardiac meds  Takes pantoprazole No heartburn on this No dysphagia  Will have leg problems at night--- uncomfortable Gabapentin helps this Occasionally takes tylenol PM--- told him to limit Uses tylenol for arthritis---rarely tramadol  Reviewed labs Has GFR ~50 Is on ACEI  Current Outpatient Medications on File Prior to Visit  Medication Sig Dispense Refill  . aspirin EC 81 MG tablet Take 81 mg by mouth daily.    . budesonide-formoterol (SYMBICORT) 160-4.5 MCG/ACT inhaler Inhale 2 puffs into the lungs daily.    Marland Kitchen gabapentin (NEURONTIN) 300 MG capsule Take 300 mg by mouth at bedtime.    Marland Kitchen lisinopril-hydrochlorothiazide (PRINZIDE,ZESTORETIC) 20-25 MG per tablet Take 1 tablet by mouth daily.    . metoprolol succinate (TOPROL-XL) 25 MG 24 hr tablet TAKE 1 TABLET BY MOUTH DAILY 30 tablet 9  . pantoprazole (PROTONIX) 40 MG tablet     . simvastatin (ZOCOR) 40 MG tablet Take 40 mg by mouth every evening.    . traMADol (ULTRAM) 50 MG tablet 50 mg 2 (two) times daily as needed.      No current facility-administered medications on file prior to visit.     Allergies  Allergen Reactions  . Neosporin [Neomycin-Bacitracin Zn-Polymyx]   . Augmentin [Amoxicillin-Pot Clavulanate] Rash    Past Medical History:  Diagnosis Date  . Asthma   . BPH (benign prostatic hypertrophy)   . Cataract   . Chronic renal disease, stage III ([place])   . Chronic sinusitis   . COPD (chronic obstructive pulmonary disease) ([place])   . Coronary artery disease    2 stents  . Gall stones   .  Hearing loss    DOES NOT WEAR HEARING AIDS  . High cholesterol   . Hypertension   . Macular degeneration    right eye    Past Surgical History:  Procedure Laterality Date  . APPENDECTOMY    . CORONARY ANGIOPLASTY WITH STENT PLACEMENT  1998   2 stents  . TRANSURETHRAL RESECTION OF PROSTATE      Family History  Problem Relation Age of Onset  . Diabetes Father   . Heart disease Brother     Social History   Socioeconomic History  . Marital status: Married    Spouse name: Not on file  . Number of children: 4  . Years of education: Not on file  . Highest education level: Not on file  Occupational History  . Occupation: retired  Scientific laboratory technician  . Financial resource strain: Not on file  . Food insecurity:    Worry: Not on file    Inability: Not on file  . Transportation needs:    Medical: Not on file    Non-medical: Not on file  Tobacco Use  . Smoking status: Former Smoker    Types: Cigarettes    Last attempt to quit: 08/13/2005    Years since quitting: 12.5  . Smokeless tobacco: Current User    Types: Chew  . Tobacco comment: discussed stopping this (only once in a while)  Substance and Sexual Activity  .  Alcohol use: No  . Drug use: No  . Sexual activity: Not on file  Lifestyle  . Physical activity:    Days per week: Not on file    Minutes per session: Not on file  . Stress: Not on file  Relationships  . Social connections:    Talks on phone: Not on file    Gets together: Not on file    Attends religious service: Not on file    Active member of club or organization: Not on file    Attends meetings of clubs or organizations: Not on file    Relationship status: Not on file  . Intimate partner violence:    Fear of current or ex partner: Not on file    Emotionally abused: Not on file    Physically abused: Not on file    Forced sexual activity: Not on file  Other Topics Concern  . Not on file  Social History Narrative   No living will   Wife, then daughter  should make decisions   Would accept resuscitation   Would probably accept tube feeds   Review of Systems Has vision problems--no longer drives to [place]. Has had shots from Dr Zigmund Daniel but not helping Appetite is good--likes vegetables mostly Weight is stable Sleeps okay Wears seat belt Hearing problems--no aides Fell once on slick mud--mild elbow injury No depression or anhedonia    Objective:   Physical Exam  Constitutional: He appears well-developed. No distress.  HENT:  Mouth/Throat: Oropharynx is clear and moist. No oropharyngeal exudate.  edentulous  Neck: No thyromegaly present.  Cardiovascular: Normal rate, regular rhythm, normal heart sounds and intact distal pulses. Exam reveals no gallop.  No murmur heard. Respiratory: Effort normal. No respiratory distress. He has no wheezes. He has no rales.  Decreased breath sounds but clear  GI: Soft. There is no tenderness.  Musculoskeletal: He exhibits no edema.  Lymphadenopathy:    He has no cervical adenopathy.  Skin: No rash noted. No erythema.  Psychiatric: He has a normal mood and affect. His behavior is normal.           Assessment & Plan:

## 2018-02-18 NOTE — Assessment & Plan Note (Signed)
Past stent Is on statin, ACEI and asa

## 2018-02-18 NOTE — Assessment & Plan Note (Signed)
BP Readings from Last 3 Encounters:  02/18/18 134/84  09/09/17 120/64  05/04/17 131/71   Good control

## 2018-02-18 NOTE — Assessment & Plan Note (Signed)
Night symptoms better with gabapentin

## 2018-03-21 ENCOUNTER — Encounter: Payer: Self-pay | Admitting: Nurse Practitioner

## 2018-03-24 ENCOUNTER — Encounter: Payer: Self-pay | Admitting: Nurse Practitioner

## 2018-03-24 ENCOUNTER — Ambulatory Visit: Payer: PPO | Admitting: Nurse Practitioner

## 2018-03-24 VITALS — BP 160/78 | HR 73 | Ht 67.0 in | Wt 167.0 lb

## 2018-03-24 DIAGNOSIS — I10 Essential (primary) hypertension: Secondary | ICD-10-CM

## 2018-03-24 DIAGNOSIS — I2510 Atherosclerotic heart disease of native coronary artery without angina pectoris: Secondary | ICD-10-CM | POA: Diagnosis not present

## 2018-03-24 NOTE — Patient Instructions (Addendum)
We will be checking the following labs today - NONE   Medication Instructions:    Continue with your current medicines.     Testing/Procedures To Be Arranged:  N/A  Follow-Up:   See Dr. Acie Fredrickson as planned.     Other Special Instructions:   N/A    If you need a refill on your cardiac medications before your next appointment, please call your pharmacy.   Call the [facility] office at [phone] if you have any questions, problems or concerns.

## 2018-03-24 NOTE — Progress Notes (Signed)
CARDIOLOGY OFFICE NOTE  Date:  03/24/2018    Leon Estes Date of Birth: [birth year] Medical Record #[id number]  PCP:  Seward Carol, MD  Cardiologist:  N[place]  Chief Complaint  Patient presents with  . Chest Pain    Work in visit - not feeling well - seen for Dr. Acie Fredrickson    History of Present Illness: Leon Estes is a 84 y.o. male who presents today for a work in visit. Seen for Dr. Acie Fredrickson.   He has a history of known CAD with remote anterior MI in 1997 - has had prior 2 stents placed by Dr. Glade Lloyd. Has seen Dr. Marlou Porch in the past - then switched to Dr. Acie Fredrickson in [place]. Other issues include anemia, COPD, CKD, chronic sinusitis, HLD, and BPH.   Last seen here by Dr. Acie Fredrickson back last August and felt to be doing ok.   Comes in today. Here with his wife today. He notes that last Wednesday and Thursday while watching TV he had a fleeting sharp pain - would only last a second or so - may have had just twice each day - but no more since and continues to do fine now. He remains fairly active with his yard. Still bowling. He has COPD and sounds like his breathing is stable. No medicines taken yet today. He has not had chest discomfort with exertion. He does not remember what his prior chest pain syndrome consisted of.   Past Medical History:  Diagnosis Date  . Asthma   . BPH (benign prostatic hypertrophy)   . Cataract   . Chronic renal disease, stage III ([place])   . Chronic sinusitis   . COPD (chronic obstructive pulmonary disease) ([place])   . Coronary artery disease    2 stents  . Gall stones   . Hearing loss    DOES NOT WEAR HEARING AIDS  . High cholesterol   . Hypertension   . Macular degeneration    right eye    Past Surgical History:  Procedure Laterality Date  . APPENDECTOMY    . CORONARY ANGIOPLASTY WITH STENT PLACEMENT  1998   2 stents  . TRANSURETHRAL RESECTION OF PROSTATE       Medications: Current Meds  Medication Sig  . aspirin EC 81 MG  tablet Take 81 mg by mouth daily.  . budesonide-formoterol (SYMBICORT) 160-4.5 MCG/ACT inhaler Inhale 2 puffs into the lungs daily.  Marland Kitchen gabapentin (NEURONTIN) 300 MG capsule Take 300 mg by mouth at bedtime.  Marland Kitchen lisinopril-hydrochlorothiazide (PRINZIDE,ZESTORETIC) 20-25 MG per tablet Take 1 tablet by mouth daily.  . metoprolol succinate (TOPROL-XL) 25 MG 24 hr tablet TAKE 1 TABLET BY MOUTH DAILY  . pantoprazole (PROTONIX) 40 MG tablet   . simvastatin (ZOCOR) 40 MG tablet Take 40 mg by mouth every evening.  . traMADol (ULTRAM) 50 MG tablet 50 mg 2 (two) times daily as needed.      Allergies: Allergies  Allergen Reactions  . Neosporin [Neomycin-Bacitracin Zn-Polymyx]   . Augmentin [Amoxicillin-Pot Clavulanate] Rash    Social History: The patient  reports that he quit smoking about 12 years ago. His smoking use included cigarettes. His smokeless tobacco use includes chew. He reports that he does not drink alcohol or use drugs.   Family History: The patient's family history includes Diabetes in his father; Heart disease in his brother.   Review of Systems: Please see the history of present illness.   Otherwise, the review of systems is positive for none.  All other systems are reviewed and negative.   Physical Exam: VS:  BP (!) 160/78 (BP Location: Left Arm, Patient Position: Sitting, Cuff Size: Normal)   Pulse 73   Ht 5\' 7"  (1.702 m)   Wt 167 lb (75.8 kg)   SpO2 96%   BMI 26.16 kg/m  .  BMI Body mass index is 26.16 kg/m.  Wt Readings from Last 3 Encounters:  03/24/18 167 lb (75.8 kg)  02/18/18 163 lb (73.9 kg)  09/09/17 165 lb (74.8 kg)    General: Pleasant. Elderly male. Alert and in no acute distress.   HEENT: Normal.  Neck: Supple, no JVD, carotid bruits, or masses noted.  Cardiac: Regular rate and rhythm. No murmurs, rubs, or gallops. No edema.  Respiratory:  Lungs are clear to auscultation bilaterally with normal work of breathing.  GI: Soft and nontender.  MS: No  deformity or atrophy. Gait and ROM intact.  Skin: Warm and dry. Color is normal.  Neuro:  Strength and sensation are intact and no gross focal deficits noted.  Psych: Alert, appropriate and with normal affect.   LABORATORY DATA:  EKG:  EKG is ordered today. This demonstrates NSR with RBBB - has inferior T wave changes. Appears unchanged.   Lab Results  Component Value Date   WBC 10.8 (H) 05/04/2017   HGB 13.2 05/04/2017   HCT 39.1 (L) 05/04/2017   PLT 257 05/04/2017   GLUCOSE 165 (H) 05/04/2017   ALT 22 05/04/2017   AST 37 05/04/2017   NA 138 05/04/2017   K 3.8 05/04/2017   CL 104 05/04/2017   CREATININE 1.66 (H) 05/04/2017   BUN 25 (H) 05/04/2017   CO2 23 05/04/2017       BNP (last 3 results) No results for input(s): BNP in the last 8760 hours.  ProBNP (last 3 results) No results for input(s): PROBNP in the last 8760 hours.   Other Studies Reviewed Today:   Assessment/Plan:  1. Atypical chest pain - very fleeting - has not recurred - does not sound cardiac at at this time. He is reassured. EKG unchanged. Will follow for now.   2. Known CAD - remote stenting of the RCA from 1997 - he has not had exertional symptoms.   3. HLD - on statin - labs noted.   4. COPD - on Symbicort  5. Advanced age.  Current medicines are reviewed with the patient today.  The patient does not have concerns regarding medicines other than what has been noted above.  The following changes have been made:  See above.  Labs/ tests ordered today include:    Orders Placed This Encounter  Procedures  . EKG 12-Lead     Disposition:   FU with Dr. Acie Fredrickson in October as planned.    Patient is agreeable to this plan and will call if any problems develop in the interim.   SignedTruitt Merle, NP  03/24/2018 8:57 AM  [facility] [address] [place], [place]  [address] Phone: [phone] Fax: [phone]

## 2018-04-03 ENCOUNTER — Other Ambulatory Visit: Payer: Self-pay | Admitting: *Deleted

## 2018-04-03 MED ORDER — METOPROLOL SUCCINATE ER 25 MG PO TB24: 25.0000 mg | ORAL_TABLET | Freq: Every day | ORAL | 11 refills | Status: DC

## 2018-05-08 ENCOUNTER — Encounter (INDEPENDENT_AMBULATORY_CARE_PROVIDER_SITE_OTHER): Payer: Self-pay

## 2018-05-08 ENCOUNTER — Encounter: Payer: Self-pay | Admitting: Family Medicine

## 2018-05-08 ENCOUNTER — Ambulatory Visit (INDEPENDENT_AMBULATORY_CARE_PROVIDER_SITE_OTHER): Payer: PPO | Admitting: Family Medicine

## 2018-05-08 VITALS — BP 100/70 | HR 75 | Temp 98.3°F | Ht 67.0 in | Wt 167.5 lb

## 2018-05-08 DIAGNOSIS — B356 Tinea cruris: Secondary | ICD-10-CM | POA: Diagnosis not present

## 2018-05-08 DIAGNOSIS — T7840XA Allergy, unspecified, initial encounter: Secondary | ICD-10-CM

## 2018-05-08 MED ORDER — PREDNISONE 20 MG PO TABS: 20.0000 mg | ORAL_TABLET | Freq: Every day | ORAL | 0 refills | Status: DC

## 2018-05-08 MED ORDER — FLUCONAZOLE 200 MG PO TABS: 200.0000 mg | ORAL_TABLET | Freq: Every day | ORAL | 0 refills | Status: DC

## 2018-05-08 NOTE — Progress Notes (Signed)
Dr. Frederico Hamman T. Sarkis Rhines, MD, [facility] [place], [address] Phone: [phone] Fax: [phone]  05/08/2018  Patient: Leon Estes, MRN: [id number], DOB: [birth year], 84 y.o.  Primary Physician:  Venia Carbon, MD   Chief Complaint  Patient presents with  . Toe Pain    Itchy, Painful, Scaley Bilateral Legs/Toes   Subjective:   Leon Estes is a 84 y.o. very pleasant male patient who presents with the following:  The same day that put on the terbinafine - within the day.  He then developed a rash to its reddish and somewhat flat on the legs within the day of putting the terbinafine on his skin.  This is in the lower extremities, the thighs, to a lesser extent the upper extremities.  It is not particularly itchy he has been taking some Benadryl.  4 weeks he has had an itchy irritating flaky rash around his toes and feet.  His wife is put various ointments on this, and when she put on the generic Lamisil he got the above reaction.  Past Medical History, Surgical History, Social History, Family History, Problem List, Medications, and Allergies have been reviewed and updated if relevant.  Patient Active Problem List   Diagnosis Date Noted  . Neuropathy 02/18/2018  . COPD (chronic obstructive pulmonary disease) ([place])   . Chronic renal disease, stage III ([place])   . Benign essential HTN 04/09/2017  . CAD (coronary artery disease) 03/03/2015  . Hyperlipidemia 03/03/2015    Past Medical History:  Diagnosis Date  . Asthma   . BPH (benign prostatic hypertrophy)   . Cataract   . Chronic renal disease, stage III ([place])   . Chronic sinusitis   . COPD (chronic obstructive pulmonary disease) ([place])   . Coronary artery disease    2 stents  . Gall stones   . Hearing loss    DOES NOT WEAR HEARING AIDS  . High cholesterol   . Hypertension   . Macular degeneration    right eye    Past Surgical History:  Procedure  Laterality Date  . APPENDECTOMY    . CORONARY ANGIOPLASTY WITH STENT PLACEMENT  1998   2 stents  . TRANSURETHRAL RESECTION OF PROSTATE      Social History   Socioeconomic History  . Marital status: Married    Spouse name: Not on file  . Number of children: 4  . Years of education: Not on file  . Highest education level: Not on file  Occupational History  . 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 Financial resource strain: Not on file  . Food insecurity:    Worry: Not on file    Inability: Not on file  . Transportation needs:    Medical: Not on file    Non-medical: Not on file  Tobacco Use  . Smoking status: Former Smoker    Types: Cigarettes    Last attempt to quit: 08/13/2005    Years since quitting: 12.7  . Smokeless tobacco: Current User    Types: Chew  . Tobacco comment: discussed stopping this (only once in a while)  Substance and Sexual Activity  . Alcohol use: No  . Drug use: No  . Sexual activity: Not on file  Lifestyle  . Physical activity:    Days per week: Not on file    Minutes per session: Not on file  . Stress: Not on file  Relationships  .  Social connections:    Talks on phone: Not on file    Gets together: Not on file    Attends religious service: Not on file    Active member of club or organization: Not on file    Attends meetings of clubs or organizations: Not on file    Relationship status: Not on file  . Intimate partner violence:    Fear of current or ex partner: Not on file    Emotionally abused: Not on file    Physically abused: Not on file    Forced sexual activity: Not on file  Other Topics Concern  . Not on file  Social History Narrative   No living will   Wife, then daughter should make decisions   Would accept resuscitation   Would probably accept tube feeds    Family History  Problem Relation Age of Onset  . Diabetes Father   . Heart disease Brother     Allergies  Allergen Reactions  . Neosporin  [Neomycin-Bacitracin Zn-Polymyx]   . Augmentin [Amoxicillin-Pot Clavulanate] Rash    Medication list reviewed and updated in full in [facility].   GEN: No acute illnesses, no fevers, chills. GI: No n/v/d, eating normally Pulm: No SOB Interactive and getting along well at home.  Otherwise, ROS is as per the HPI.  Objective:   BP 100/70   Pulse 75   Temp 98.3 F (36.8 C) (Oral)   Ht 5\' 7"  (1.702 m)   Wt 167 lb 8 oz (76 kg)   BMI 26.23 kg/m   GEN: WDWN, NAD, Non-toxic, A & O x 3 HEENT: Atraumatic, Normocephalic. Neck supple. No masses, No LAD. Ears and Nose: No external deformity. CV: RRR, No M/G/R. No JVD. No thrill. No extra heart sounds. PULM: CTA B, no wheezes, crackles, rhonchi. No retractions. No resp. distress. No accessory muscle use. EXTR: No c/c/e NEURO Normal gait.  PSYCH: Normally interactive. Conversant. Not depressed or anxious appearing.  Calm demeanor.   SKIN: Extensive evidence of flaking skin, somewhat moist in appearance and quite aggravated around the toes and forefoot.  There is a flat reddish rash around his lower extremities and thighs and some parts of his upper extremities.  There is no significant scale here.  Laboratory and Imaging Data:  Assessment and Plan:   Tinea cruris  Allergic reaction to drug, initial encounter  Diflucan for extensive tinea  Prednisone and benadryl for drug reaction  Follow-up: No follow-ups on file.  Meds ordered this encounter  Medications  . predniSONE (DELTASONE) 20 MG tablet    Sig: Take 1 tablet (20 mg total) by mouth daily.    Dispense:  7 tablet    Refill:  0  . fluconazole (DIFLUCAN) 200 MG tablet    Sig: Take 1 tablet (200 mg total) by mouth daily.    Dispense:  21 tablet    Refill:  0   No orders of the defined types were placed in this encounter.   Signed,  [name]. Warda Mcqueary, MD   Allergies as of 05/08/2018      Reactions   Neosporin [neomycin-bacitracin Zn-polymyx]    Augmentin  [amoxicillin-pot Clavulanate] Rash   Terbinafine And Related Rash      Medication List        Accurate as of 05/08/18  1:47 PM. Always use your most recent med list.          aspirin EC 81 MG tablet Take 81 mg by mouth daily.  budesonide-formoterol 160-4.5 MCG/ACT inhaler Commonly known as:  SYMBICORT Inhale 2 puffs into the lungs daily.   fluconazole 200 MG tablet Commonly known as:  DIFLUCAN Take 1 tablet (200 mg total) by mouth daily.   gabapentin 300 MG capsule Commonly known as:  NEURONTIN Take 300 mg by mouth at bedtime.   lisinopril-hydrochlorothiazide 20-25 MG tablet Commonly known as:  PRINZIDE,ZESTORETIC Take 1 tablet by mouth daily.   metoprolol succinate 25 MG 24 hr tablet Commonly known as:  TOPROL-XL Take 1 tablet (25 mg total) by mouth daily.   pantoprazole 40 MG tablet Commonly known as:  PROTONIX   predniSONE 20 MG tablet Commonly known as:  DELTASONE Take 1 tablet (20 mg total) by mouth daily.   simvastatin 40 MG tablet Commonly known as:  ZOCOR Take 40 mg by mouth every evening.   traMADol 50 MG tablet Commonly known as:  ULTRAM 50 mg 2 (two) times daily as needed.

## 2018-05-13 ENCOUNTER — Encounter

## 2018-05-13 ENCOUNTER — Ambulatory Visit: Payer: PPO | Admitting: Cardiovascular Disease

## 2018-06-27 ENCOUNTER — Encounter (INDEPENDENT_AMBULATORY_CARE_PROVIDER_SITE_OTHER): Payer: PPO | Admitting: Ophthalmology

## 2018-07-25 ENCOUNTER — Ambulatory Visit (INDEPENDENT_AMBULATORY_CARE_PROVIDER_SITE_OTHER): Payer: PPO | Admitting: Internal Medicine

## 2018-07-25 ENCOUNTER — Encounter: Payer: Self-pay | Admitting: Internal Medicine

## 2018-07-25 VITALS — BP 110/64 | HR 55 | Temp 98.2°F | Ht 67.0 in | Wt 164.0 lb

## 2018-07-25 DIAGNOSIS — R32 Unspecified urinary incontinence: Secondary | ICD-10-CM | POA: Insufficient documentation

## 2018-07-25 DIAGNOSIS — N3943 Post-void dribbling: Secondary | ICD-10-CM

## 2018-07-25 LAB — POC URINALSYSI DIPSTICK (AUTOMATED)
Glucose, UA: NEGATIVE
KETONES UA: 5
Nitrite, UA: NEGATIVE
PROTEIN UA: POSITIVE — AB
RBC UA: NEGATIVE
Urobilinogen, UA: 0.2 E.U./dL
pH, UA: 6 (ref 5.0–8.0)

## 2018-07-25 MED ORDER — SULFAMETHOXAZOLE-TRIMETHOPRIM 800-160 MG PO TABS: 1.0000 | ORAL_TABLET | Freq: Two times a day (BID) | ORAL | 0 refills | Status: DC

## 2018-07-25 NOTE — Progress Notes (Signed)
Subjective:    Patient ID: Leon Estes, male    DOB: June 20, 1931, 84 y.o.   MRN: [id number]  HPI Here due to urinary leakage  Notes some drainage after he voids Not every time--but will dribble after at times No dysuria or hematuria Some urgency and increased frequency No fever and does not feel ill  TURP in his records--but he doesn't remember this  Current Outpatient Medications on File Prior to Visit  Medication Sig Dispense Refill  . aspirin EC 81 MG tablet Take 81 mg by mouth daily.    . budesonide-formoterol (SYMBICORT) 160-4.5 MCG/ACT inhaler Inhale 2 puffs into the lungs daily.    Marland Kitchen gabapentin (NEURONTIN) 300 MG capsule Take 300 mg by mouth at bedtime.    Marland Kitchen lisinopril-hydrochlorothiazide (PRINZIDE,ZESTORETIC) 20-25 MG per tablet Take 1 tablet by mouth daily.    . metoprolol succinate (TOPROL-XL) 25 MG 24 hr tablet Take 1 tablet (25 mg total) by mouth daily. 30 tablet 11  . pantoprazole (PROTONIX) 40 MG tablet     . simvastatin (ZOCOR) 40 MG tablet Take 40 mg by mouth every evening.    . traMADol (ULTRAM) 50 MG tablet 50 mg 2 (two) times daily as needed.      No current facility-administered medications on file prior to visit.     Allergies  Allergen Reactions  . Neosporin [Neomycin-Bacitracin Zn-Polymyx]   . Augmentin [Amoxicillin-Pot Clavulanate] Rash  . Terbinafine And Related Rash    Past Medical History:  Diagnosis Date  . Asthma   . BPH (benign prostatic hypertrophy)   . Cataract   . Chronic renal disease, stage III ([place])   . Chronic sinusitis   . COPD (chronic obstructive pulmonary disease) ([place])   . Coronary artery disease    2 stents  . Gall stones   . Hearing loss    DOES NOT WEAR HEARING AIDS  . High cholesterol   . Hypertension   . Macular degeneration    right eye    Past Surgical History:  Procedure Laterality Date  . APPENDECTOMY    . CORONARY ANGIOPLASTY WITH STENT PLACEMENT  1998   2 stents  . TRANSURETHRAL RESECTION OF PROSTATE       Family History  Problem Relation Age of Onset  . Diabetes Father   . Heart disease Brother     Social History   Socioeconomic History  . Marital status: Married    Spouse name: Not on file  . Number of children: 4  . Years of education: Not on file  . Highest education level: Not on file  Occupational History  . 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 Financial resource strain: Not on file  . Food insecurity:    Worry: Not on file    Inability: Not on file  . Transportation needs:    Medical: Not on file    Non-medical: Not on file  Tobacco Use  . Smoking status: Former Smoker    Types: Cigarettes    Last attempt to quit: 08/13/2005    Years since quitting: 12.9  . Smokeless tobacco: Current User    Types: Chew  . Tobacco comment: discussed stopping this (only once in a while)  Substance and Sexual Activity  . Alcohol use: No  . Drug use: No  . Sexual activity: Not on file  Lifestyle  . Physical activity:    Days per week: Not on file    Minutes per session: Not on file  .  Stress: Not on file  Relationships  . Social connections:    Talks on phone: Not on file    Gets together: Not on file    Attends religious service: Not on file    Active member of club or organization: Not on file    Attends meetings of clubs or organizations: Not on file    Relationship status: Not on file  . Intimate partner violence:    Fear of current or ex partner: Not on file    Emotionally abused: Not on file    Physically abused: Not on file    Forced sexual activity: Not on file  Other Topics Concern  . Not on file  Social History Narrative   No living will   Wife, then daughter should make decisions   Would accept resuscitation   Would probably accept tube feeds   Review of Systems No N/V Appetite is okay--mostly just 2 meals a day No back pain--just AM stiffness     Objective:   Physical Exam  GI: Soft. He exhibits no distension. There is no  abdominal tenderness. There is no rebound and no guarding.  Genitourinary:    Genitourinary Comments: Uncircumcised No urethral inflammation Scrotum is quiet   Musculoskeletal:     Comments: No CVA tenderness           Assessment & Plan:

## 2018-07-25 NOTE — Assessment & Plan Note (Signed)
May just be prostate enlargement--but just recent symptoms  ?slight leuks on urine No symptoms of infection Will treat with 3 days of septra Send urine culture If persists, may want to try tamsulosin

## 2018-07-25 NOTE — Addendum Note (Signed)
Addended by: Pilar Grammes on: 07/25/2018 01:05 PM   Modules accepted: Orders

## 2018-07-25 NOTE — Patient Instructions (Signed)
Start the antibiotic. If you get the report that the culture was negative, you can stop it after 3 days

## 2018-07-26 LAB — URINE CULTURE
MICRO NUMBER: 91495528
SPECIMEN QUALITY:: ADEQUATE

## 2018-07-31 ENCOUNTER — Ambulatory Visit (INDEPENDENT_AMBULATORY_CARE_PROVIDER_SITE_OTHER): Payer: PPO | Admitting: Primary Care

## 2018-07-31 ENCOUNTER — Encounter: Payer: Self-pay | Admitting: Primary Care

## 2018-07-31 DIAGNOSIS — N3943 Post-void dribbling: Secondary | ICD-10-CM

## 2018-07-31 DIAGNOSIS — R21 Rash and other nonspecific skin eruption: Secondary | ICD-10-CM

## 2018-07-31 MED ORDER — METHYLPREDNISOLONE ACETATE 80 MG/ML IJ SUSP
80.0000 mg | Freq: Once | INTRAMUSCULAR | Status: AC
  Administered 2018-07-31: 80 mg via INTRAMUSCULAR

## 2018-07-31 NOTE — Patient Instructions (Signed)
You will be contacted regarding your referral to Urology.  Please let us know if you have not been contacted within one week.   You were provided with an injection today to get rid of the rash.  Start Claritin, Zyrtec, or Allegra daily for the rash and itching.  It was a pleasure to see you today!

## 2018-07-31 NOTE — Assessment & Plan Note (Signed)
Continues despite antibiotic treatment. He declines a trial of tamsulosin and would like Urology referral. Referral placed.

## 2018-07-31 NOTE — Assessment & Plan Note (Addendum)
Appears to be urtica type rash, could be from recent antibiotics. Does not appear to be shingles. IM Depo Medrol provided today. Discussed to start daily second generation antihistamine, continue cortisone cream PRN. His wife will update if no continued improvement.  He appears stable and is in no distress.

## 2018-07-31 NOTE — Addendum Note (Signed)
Addended by: Jacqualin Combes on: 07/31/2018 09:21 AM   Modules accepted: Orders

## 2018-07-31 NOTE — Progress Notes (Signed)
Subjective:    Patient ID: Leon Estes, male    DOB: [birth year], 84 y.o.   MRN: [id number]  HPI  Leon Estes is an 84 year old male with a history of hypertension, neuropathy, CKD, COPD who presents today with a chief complaint of rash.   His rash is located to bilateral posterior thoracic and lumbar trunk for which he noticed 2 days ago. The rash is more so to the left side of his lower back. He was initiated on sulfamethoxazole-trimethoprim on 07/25/18 for symptoms of urinary leakage. He completed the antibiotics on 07/28/15 and the rash began on 07/28/18. His rash is itchy in nature, improved with OTC hydrocortisone. He denies pain, vesicles, fevers. He is not taking an antihistamine.  He's been leaking urine intermittently for the last three months. He endorses intermittent dysuria and frequency. He and is wife are requesting a referral to be seen by Urology, they called the Urology office yesterday and they requested a referral. He is not managed on tamsulosin. History of TURP 20+ years ago. He denies hematuria.   Review of Systems  Constitutional: Negative for fever.  Genitourinary: Positive for dysuria and frequency. Negative for difficulty urinating and hematuria.       Urinary leakage  Skin: Positive for rash.       Past Medical History:  Diagnosis Date  . Asthma   . BPH (benign prostatic hypertrophy)   . Cataract   . Chronic renal disease, stage III ([place])   . Chronic sinusitis   . COPD (chronic obstructive pulmonary disease) ([place])   . Coronary artery disease    2 stents  . Gall stones   . Hearing loss    DOES NOT WEAR HEARING AIDS  . High cholesterol   . Hypertension   . Macular degeneration    right eye     Social History   Socioeconomic History  . Marital status: Married    Spouse name: Not on file  . Number of children: 4  . Years of education: Not on file  . Highest education level: Not on file  Occupational History  . 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 Financial resource strain: Not on file  . Food insecurity:    Worry: Not on file    Inability: Not on file  . Transportation needs:    Medical: Not on file    Non-medical: Not on file  Tobacco Use  . Smoking status: Former Smoker    Types: Cigarettes    Last attempt to quit: 08/13/2005    Years since quitting: 12.9  . Smokeless tobacco: Current User    Types: Chew  . Tobacco comment: discussed stopping this (only once in a while)  Substance and Sexual Activity  . Alcohol use: No  . Drug use: No  . Sexual activity: Not on file  Lifestyle  . Physical activity:    Days per week: Not on file    Minutes per session: Not on file  . Stress: Not on file  Relationships  . Social connections:    Talks on phone: Not on file    Gets together: Not on file    Attends religious service: Not on file    Active member of club or organization: Not on file    Attends meetings of clubs or organizations: Not on file    Relationship status: Not on file  . Intimate partner violence:    Fear of current or ex  partner: Not on file    Emotionally abused: Not on file    Physically abused: Not on file    Forced sexual activity: Not on file  Other Topics Concern  . Not on file  Social History Narrative   No living will   Wife, then daughter should make decisions   Would accept resuscitation   Would probably accept tube feeds    Past Surgical History:  Procedure Laterality Date  . APPENDECTOMY    . CORONARY ANGIOPLASTY WITH STENT PLACEMENT  1998   2 stents  . TRANSURETHRAL RESECTION OF PROSTATE      Family History  Problem Relation Age of Onset  . Diabetes Father   . Heart disease Brother     Allergies  Allergen Reactions  . Sulfa Antibiotics Rash  . Neosporin [Neomycin-Bacitracin Zn-Polymyx]   . Augmentin [Amoxicillin-Pot Clavulanate] Rash  . Terbinafine And Related Rash    Current Outpatient Medications on File Prior to Visit  Medication Sig  Dispense Refill  . aspirin EC 81 MG tablet Take 81 mg by mouth daily.    . budesonide-formoterol (SYMBICORT) 160-4.5 MCG/ACT inhaler Inhale 2 puffs into the lungs daily.    Marland Kitchen gabapentin (NEURONTIN) 300 MG capsule Take 300 mg by mouth at bedtime.    Marland Kitchen lisinopril-hydrochlorothiazide (PRINZIDE,ZESTORETIC) 20-25 MG per tablet Take 1 tablet by mouth daily.    . metoprolol succinate (TOPROL-XL) 25 MG 24 hr tablet Take 1 tablet (25 mg total) by mouth daily. 30 tablet 11  . pantoprazole (PROTONIX) 40 MG tablet     . simvastatin (ZOCOR) 40 MG tablet Take 40 mg by mouth every evening.    . traMADol (ULTRAM) 50 MG tablet 50 mg 2 (two) times daily as needed.     . sulfamethoxazole-trimethoprim (BACTRIM DS,SEPTRA DS) 800-160 MG tablet Take 1 tablet by mouth 2 (two) times daily. (Patient not taking: Reported on 07/31/2018) 14 tablet 0   No current facility-administered medications on file prior to visit.     BP 116/72   Pulse 63   Temp 98.1 F (36.7 C) (Oral)   Ht 5\' 7"  (1.702 m)   Wt 162 lb 4 oz (73.6 kg)   SpO2 99%   BMI 25.41 kg/m    Objective:   Physical Exam  Constitutional: He appears well-nourished.  Cardiovascular: Normal rate and regular rhythm.  Respiratory: Effort normal and breath sounds normal.  Skin: Skin is warm and dry. Rash noted.  Wide spread rash to bilateral posterior thoracic and lumbar trunk, more predominantly to the left lower trunk. No vesicles, no wounds, non tender.           Assessment & Plan:

## 2018-08-05 ENCOUNTER — Telehealth: Payer: Self-pay

## 2018-08-05 NOTE — Telephone Encounter (Signed)
Tried to call pt to find out how he was doing since his urine culture was negative. Is he still having voiding issues.

## 2018-08-22 DIAGNOSIS — N183 Chronic kidney disease, stage 3 (moderate): Secondary | ICD-10-CM | POA: Diagnosis not present

## 2018-08-22 DIAGNOSIS — N400 Enlarged prostate without lower urinary tract symptoms: Secondary | ICD-10-CM | POA: Diagnosis not present

## 2018-08-22 DIAGNOSIS — E7800 Pure hypercholesterolemia, unspecified: Secondary | ICD-10-CM | POA: Diagnosis not present

## 2018-08-22 DIAGNOSIS — I10 Essential (primary) hypertension: Secondary | ICD-10-CM | POA: Diagnosis not present

## 2018-08-22 DIAGNOSIS — Z0000 Encounter for general adult medical examination without abnormal findings: Secondary | ICD-10-CM | POA: Diagnosis not present

## 2018-08-22 DIAGNOSIS — Z1389 Encounter for screening for other disorder: Secondary | ICD-10-CM | POA: Diagnosis not present

## 2018-08-22 DIAGNOSIS — R202 Paresthesia of skin: Secondary | ICD-10-CM | POA: Diagnosis not present

## 2018-09-15 ENCOUNTER — Encounter: Payer: Self-pay | Admitting: Urology

## 2018-09-15 ENCOUNTER — Ambulatory Visit: Payer: Self-pay | Admitting: Urology

## 2018-10-09 ENCOUNTER — Ambulatory Visit (INDEPENDENT_AMBULATORY_CARE_PROVIDER_SITE_OTHER): Payer: PPO | Admitting: Internal Medicine

## 2018-10-09 ENCOUNTER — Encounter: Payer: Self-pay | Admitting: Internal Medicine

## 2018-10-09 VITALS — BP 122/64 | HR 88 | Temp 98.0°F | Ht 67.0 in | Wt 164.8 lb

## 2018-10-09 DIAGNOSIS — Z131 Encounter for screening for diabetes mellitus: Secondary | ICD-10-CM | POA: Diagnosis not present

## 2018-10-09 DIAGNOSIS — J449 Chronic obstructive pulmonary disease, unspecified: Secondary | ICD-10-CM | POA: Diagnosis not present

## 2018-10-09 DIAGNOSIS — G629 Polyneuropathy, unspecified: Secondary | ICD-10-CM | POA: Diagnosis not present

## 2018-10-09 DIAGNOSIS — J019 Acute sinusitis, unspecified: Secondary | ICD-10-CM | POA: Diagnosis not present

## 2018-10-09 MED ORDER — DOXYCYCLINE HYCLATE 100 MG PO TABS: 100.0000 mg | ORAL_TABLET | Freq: Two times a day (BID) | ORAL | 0 refills | Status: DC

## 2018-10-09 NOTE — Assessment & Plan Note (Signed)
Not exacerbated 

## 2018-10-09 NOTE — Progress Notes (Signed)
Subjective:    Patient ID: Leon Estes, male    DOB: 06-Apr-1931, 85 y.o.   MRN: [id number]  HPI Here due to respiratory illness With wife  Started about a week ago Cough---hears noise in his chest Initially felt chilled---then burned up with fever  Does have nasal congestion and rhinorrhea Past chronic sinus problems---similar No ear pain No sore throat Some headache---left temporal mostly  Only taking tylenol--?some help (for fever)  Current Outpatient Medications on File Prior to Visit  Medication Sig Dispense Refill  . aspirin EC 81 MG tablet Take 81 mg by mouth daily.    . budesonide-formoterol (SYMBICORT) 160-4.5 MCG/ACT inhaler Inhale 2 puffs into the lungs daily.    Marland Kitchen gabapentin (NEURONTIN) 300 MG capsule Take 300 mg by mouth at bedtime.    Marland Kitchen lisinopril-hydrochlorothiazide (PRINZIDE,ZESTORETIC) 20-25 MG per tablet Take 1 tablet by mouth daily.    . metoprolol succinate (TOPROL-XL) 25 MG 24 hr tablet Take 1 tablet (25 mg total) by mouth daily. 30 tablet 11  . pantoprazole (PROTONIX) 40 MG tablet     . simvastatin (ZOCOR) 40 MG tablet Take 40 mg by mouth every evening.    . traMADol (ULTRAM) 50 MG tablet 50 mg 2 (two) times daily as needed.      No current facility-administered medications on file prior to visit.     Allergies  Allergen Reactions  . Sulfa Antibiotics Rash  . Neosporin [Neomycin-Bacitracin Zn-Polymyx]   . Augmentin [Amoxicillin-Pot Clavulanate] Rash  . Terbinafine And Related Rash    Past Medical History:  Diagnosis Date  . Asthma   . BPH (benign prostatic hypertrophy)   . Cataract   . Chronic renal disease, stage III ([place])   . Chronic sinusitis   . COPD (chronic obstructive pulmonary disease) ([place])   . Coronary artery disease    2 stents  . Gall stones   . Hearing loss    DOES NOT WEAR HEARING AIDS  . High cholesterol   . Hypertension   . Macular degeneration    right eye    Past Surgical History:  Procedure Laterality Date  .  APPENDECTOMY    . CORONARY ANGIOPLASTY WITH STENT PLACEMENT  1998   2 stents  . TRANSURETHRAL RESECTION OF PROSTATE      Family History  Problem Relation Age of Onset  . Diabetes Father   . Heart disease Brother     Social History   Socioeconomic History  . Marital status: Married    Spouse name: Not on file  . Number of children: 4  . Years of education: Not on file  . Highest education level: Not on file  Occupational History  . 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 Financial resource strain: Not on file  . Food insecurity:    Worry: Not on file    Inability: Not on file  . Transportation needs:    Medical: Not on file    Non-medical: Not on file  Tobacco Use  . Smoking status: Former Smoker    Types: Cigarettes    Last attempt to quit: 08/13/2005    Years since quitting: 13.1  . Smokeless tobacco: Current User    Types: Chew  . Tobacco comment: discussed stopping this (only once in a while)  Substance and Sexual Activity  . Alcohol use: No  . Drug use: No  . Sexual activity: Not on file  Lifestyle  . Physical activity:    Days per  week: Not on file    Minutes per session: Not on file  . Stress: Not on file  Relationships  . Social connections:    Talks on phone: Not on file    Gets together: Not on file    Attends religious service: Not on file    Active member of club or organization: Not on file    Attends meetings of clubs or organizations: Not on file    Relationship status: Not on file  . Intimate partner violence:    Fear of current or ex partner: Not on file    Emotionally abused: Not on file    Physically abused: Not on file    Forced sexual activity: Not on file  Other Topics Concern  . Not on file  Social History Narrative   No living will   Wife, then daughter should make decisions   Would accept resuscitation   Would probably accept tube feeds   Review of Systems  No rash No vomiting or diarrhea Appetite is  okay Also with toe tingling, burning     Objective:   Physical Exam  Constitutional: He appears well-developed. No distress.  HENT:  No sinus tenderness Moderate nasal inflammation---small polyps? TMs normal Pharynx benign  Neck: No thyromegaly present.  Respiratory: Effort normal. No respiratory distress. He has no wheezes. He has no rales.  Decreased breath sounds but clear  Lymphadenopathy:    He has no cervical adenopathy.  Skin:  No foot abnormalities evident           Assessment & Plan:

## 2018-10-09 NOTE — Addendum Note (Signed)
Addended by: Ellamae Sia on: 10/09/2018 03:37 PM   Modules accepted: Orders

## 2018-10-09 NOTE — Assessment & Plan Note (Signed)
Ongoing sensory loss  Will recheck labs at wife's request

## 2018-10-09 NOTE — Assessment & Plan Note (Signed)
Has had past problems Will give doxy Trial of flonase again for the polyps

## 2018-10-10 LAB — COMPREHENSIVE METABOLIC PANEL
ALBUMIN: 4.1 g/dL (ref 3.5–5.2)
ALK PHOS: 58 U/L (ref 39–117)
ALT: 25 U/L (ref 0–53)
AST: 37 U/L (ref 0–37)
BILIRUBIN TOTAL: 0.3 mg/dL (ref 0.2–1.2)
BUN: 28 mg/dL — AB (ref 6–23)
CO2: 26 mEq/L (ref 19–32)
Calcium: 9.4 mg/dL (ref 8.4–10.5)
Chloride: 102 mEq/L (ref 96–112)
Creatinine, Ser: 1.64 mg/dL — ABNORMAL HIGH (ref 0.40–1.50)
GFR: 40.07 mL/min — ABNORMAL LOW (ref >60.00)
GLUCOSE: 101 mg/dL — AB (ref 70–99)
POTASSIUM: 3.8 meq/L (ref 3.5–5.1)
SODIUM: 140 meq/L (ref 135–145)
TOTAL PROTEIN: 7.3 g/dL (ref 6.0–8.3)

## 2018-10-10 LAB — CBC
HCT: 36.3 % — ABNORMAL LOW (ref 39.0–52.0)
Hemoglobin: 12.1 g/dL — ABNORMAL LOW (ref 13.0–17.0)
MCHC: 33.4 g/dL (ref 30.0–36.0)
MCV: 91.3 fl (ref 78.0–100.0)
PLATELETS: 286 10*3/uL (ref 150.0–400.0)
RBC: 3.97 Mil/uL — ABNORMAL LOW (ref 4.22–5.81)
RDW: 14.3 % (ref 11.5–15.5)
WBC: 9.2 10*3/uL (ref 4.0–10.5)

## 2018-10-10 LAB — HEMOGLOBIN A1C: HEMOGLOBIN A1C: 7.1 % — AB (ref 4.6–6.5)

## 2018-10-10 LAB — VITAMIN B12: Vitamin B-12: 604 pg/mL (ref 211–911)

## 2018-10-10 LAB — T4, FREE: FREE T4: 1.01 ng/dL (ref 0.60–1.60)

## 2018-10-20 ENCOUNTER — Encounter: Payer: Self-pay | Admitting: Internal Medicine

## 2018-10-20 ENCOUNTER — Ambulatory Visit (INDEPENDENT_AMBULATORY_CARE_PROVIDER_SITE_OTHER): Payer: PPO | Admitting: Internal Medicine

## 2018-10-20 VITALS — BP 100/56 | HR 81 | Temp 98.2°F | Ht 67.0 in | Wt 160.0 lb

## 2018-10-20 DIAGNOSIS — R7303 Prediabetes: Secondary | ICD-10-CM | POA: Diagnosis not present

## 2018-10-20 NOTE — Progress Notes (Signed)
Subjective:    Patient ID: Leon Estes, male    DOB: August 13, 1931, 85 y.o.   MRN: [id number]  HPI Here due to elevated sugar and A1c  Has cut out sweets and ice cream since hearing about the lab results Weight is down some Reviewed labs with him  Current Outpatient Medications on File Prior to Visit  Medication Sig Dispense Refill  . aspirin EC 81 MG tablet Take 81 mg by mouth daily.    . budesonide-formoterol (SYMBICORT) 160-4.5 MCG/ACT inhaler Inhale 2 puffs into the lungs daily.    Marland Kitchen gabapentin (NEURONTIN) 300 MG capsule Take 300 mg by mouth at bedtime.    Marland Kitchen lisinopril-hydrochlorothiazide (PRINZIDE,ZESTORETIC) 20-25 MG per tablet Take 1 tablet by mouth daily.    . metoprolol succinate (TOPROL-XL) 25 MG 24 hr tablet Take 1 tablet (25 mg total) by mouth daily. 30 tablet 11  . pantoprazole (PROTONIX) 40 MG tablet     . simvastatin (ZOCOR) 40 MG tablet Take 40 mg by mouth every evening.    . traMADol (ULTRAM) 50 MG tablet 50 mg 2 (two) times daily as needed.      No current facility-administered medications on file prior to visit.     Allergies  Allergen Reactions  . Sulfa Antibiotics Rash  . Neosporin [Neomycin-Bacitracin Zn-Polymyx]   . Augmentin [Amoxicillin-Pot Clavulanate] Rash  . Terbinafine And Related Rash    Past Medical History:  Diagnosis Date  . Asthma   . BPH (benign prostatic hypertrophy)   . Cataract   . Chronic renal disease, stage III ([place])   . Chronic sinusitis   . COPD (chronic obstructive pulmonary disease) ([place])   . Coronary artery disease    2 stents  . Gall stones   . Hearing loss    DOES NOT WEAR HEARING AIDS  . High cholesterol   . Hypertension   . Macular degeneration    right eye    Past Surgical History:  Procedure Laterality Date  . APPENDECTOMY    . CORONARY ANGIOPLASTY WITH STENT PLACEMENT  1998   2 stents  . TRANSURETHRAL RESECTION OF PROSTATE      Family History  Problem Relation Age of Onset  . Diabetes Father   .  Heart disease Brother     Social History   Socioeconomic History  . Marital status: Married    Spouse name: Not on file  . Number of children: 4  . Years of education: Not on file  . Highest education level: Not on file  Occupational History  . 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 Financial resource strain: Not on file  . Food insecurity:    Worry: Not on file    Inability: Not on file  . Transportation needs:    Medical: Not on file    Non-medical: Not on file  Tobacco Use  . Smoking status: Former Smoker    Types: Cigarettes    Last attempt to quit: 08/13/2005    Years since quitting: 13.1  . Smokeless tobacco: Current User    Types: Chew  . Tobacco comment: discussed stopping this (only once in a while)  Substance and Sexual Activity  . Alcohol use: No  . Drug use: No  . Sexual activity: Not on file  Lifestyle  . Physical activity:    Days per week: Not on file    Minutes per session: Not on file  . Stress: Not on file  Relationships  . Social  connections:    Talks on phone: Not on file    Gets together: Not on file    Attends religious service: Not on file    Active member of club or organization: Not on file    Attends meetings of clubs or organizations: Not on file    Relationship status: Not on file  . Intimate partner violence:    Fear of current or ex partner: Not on file    Emotionally abused: Not on file    Physically abused: Not on file    Forced sexual activity: Not on file  Other Topics Concern  . Not on file  Social History Narrative   No living will   Wife, then daughter should make decisions   Would accept resuscitation   Would probably accept tube feeds   Review of Systems Has lost 4# since last visit Sinus symptoms seem better now Sleeps okay Some headaches--tylenol helps    Objective:   Physical Exam  Constitutional: He appears well-developed. No distress.  Psychiatric: He has a normal mood and affect. His  behavior is normal.           Assessment & Plan:

## 2018-10-20 NOTE — Patient Instructions (Signed)
DASH Eating Plan  DASH stands for "Dietary Approaches to Stop Hypertension." The DASH eating plan is a healthy eating plan that has been shown to reduce high blood pressure (hypertension). It may also reduce your risk for type 2 diabetes, heart disease, and stroke. The DASH eating plan may also help with weight loss.  What are tips for following this plan?    General guidelines   Avoid eating more than 2,300 mg (milligrams) of salt (sodium) a day. If you have hypertension, you may need to reduce your sodium intake to 1,500 mg a day.   Limit alcohol intake to no more than 1 drink a day for nonpregnant women and 2 drinks a day for men. One drink equals 12 oz of beer, 5 oz of wine, or 1 oz of hard liquor.   Work with your health care provider to maintain a healthy body weight or to lose weight. Ask what an ideal weight is for you.   Get at least 30 minutes of exercise that causes your heart to beat faster (aerobic exercise) most days of the week. Activities may include walking, swimming, or biking.   Work with your health care provider or diet and nutrition specialist (dietitian) to adjust your eating plan to your individual calorie needs.  Reading food labels     Check food labels for the amount of sodium per serving. Choose foods with less than 5 percent of the Daily Value of sodium. Generally, foods with less than 300 mg of sodium per serving fit into this eating plan.   To find whole grains, look for the word "whole" as the first word in the ingredient list.  Shopping   Buy products labeled as "low-sodium" or "no salt added."   Buy fresh foods. Avoid canned foods and premade or frozen meals.  Cooking   Avoid adding salt when cooking. Use salt-free seasonings or herbs instead of table salt or sea salt. Check with your health care provider or pharmacist before using salt substitutes.   Do not fry foods. Cook foods using healthy methods such as baking, boiling, grilling, and broiling instead.   Cook with  heart-healthy oils, such as olive, canola, soybean, or sunflower oil.  Meal planning   Eat a balanced diet that includes:  ? 5 or more servings of fruits and vegetables each day. At each meal, try to fill half of your plate with fruits and vegetables.  ? Up to 6-8 servings of whole grains each day.  ? Less than 6 oz of lean meat, poultry, or fish each day. A 3-oz serving of meat is about the same size as a deck of cards. One egg equals 1 oz.  ? 2 servings of low-fat dairy each day.  ? A serving of nuts, seeds, or beans 5 times each week.  ? Heart-healthy fats. Healthy fats called Omega-3 fatty acids are found in foods such as flaxseeds and coldwater fish, like sardines, salmon, and mackerel.   Limit how much you eat of the following:  ? Canned or prepackaged foods.  ? Food that is high in trans fat, such as fried foods.  ? Food that is high in saturated fat, such as fatty meat.  ? Sweets, desserts, sugary drinks, and other foods with added sugar.  ? Full-fat dairy products.   Do not salt foods before eating.   Try to eat at least 2 vegetarian meals each week.   Eat more home-cooked food and less restaurant, buffet, and fast food.     When eating at a restaurant, ask that your food be prepared with less salt or no salt, if possible.  What foods are recommended?  The items listed may not be a complete list. Talk with your dietitian about what dietary choices are best for you.  Grains  Whole-grain or whole-wheat bread. Whole-grain or whole-wheat pasta. Brown rice. Oatmeal. Quinoa. Bulgur. Whole-grain and low-sodium cereals. Pita bread. Low-fat, low-sodium crackers. Whole-wheat flour tortillas.  Vegetables  Fresh or frozen vegetables (raw, steamed, roasted, or grilled). Low-sodium or reduced-sodium tomato and vegetable juice. Low-sodium or reduced-sodium tomato sauce and tomato paste. Low-sodium or reduced-sodium canned vegetables.  Fruits  All fresh, dried, or frozen fruit. Canned fruit in natural juice (without  added sugar).  Meat and other protein foods  Skinless chicken or turkey. Ground chicken or turkey. Pork with fat trimmed off. Fish and seafood. Egg whites. Dried beans, peas, or lentils. Unsalted nuts, nut butters, and seeds. Unsalted canned beans. Lean cuts of beef with fat trimmed off. Low-sodium, lean deli meat.  Dairy  Low-fat (1%) or fat-free (skim) milk. Fat-free, low-fat, or reduced-fat cheeses. Nonfat, low-sodium ricotta or cottage cheese. Low-fat or nonfat yogurt. Low-fat, low-sodium cheese.  Fats and oils  Soft margarine without trans fats. Vegetable oil. Low-fat, reduced-fat, or light mayonnaise and salad dressings (reduced-sodium). Canola, safflower, olive, soybean, and sunflower oils. Avocado.  Seasoning and other foods  Herbs. Spices. Seasoning mixes without salt. Unsalted popcorn and pretzels. Fat-free sweets.  What foods are not recommended?  The items listed may not be a complete list. Talk with your dietitian about what dietary choices are best for you.  Grains  Baked goods made with fat, such as croissants, muffins, or some breads. Dry pasta or rice meal packs.  Vegetables  Creamed or fried vegetables. Vegetables in a cheese sauce. Regular canned vegetables (not low-sodium or reduced-sodium). Regular canned tomato sauce and paste (not low-sodium or reduced-sodium). Regular tomato and vegetable juice (not low-sodium or reduced-sodium). Pickles. Olives.  Fruits  Canned fruit in a light or heavy syrup. Fried fruit. Fruit in cream or butter sauce.  Meat and other protein foods  Fatty cuts of meat. Ribs. Fried meat. Bacon. Sausage. Bologna and other processed lunch meats. Salami. Fatback. Hotdogs. Bratwurst. Salted nuts and seeds. Canned beans with added salt. Canned or smoked fish. Whole eggs or egg yolks. Chicken or turkey with skin.  Dairy  Whole or 2% milk, cream, and half-and-half. Whole or full-fat cream cheese. Whole-fat or sweetened yogurt. Full-fat cheese. Nondairy creamers. Whipped toppings.  Processed cheese and cheese spreads.  Fats and oils  Butter. Stick margarine. Lard. Shortening. Ghee. Bacon fat. Tropical oils, such as coconut, palm kernel, or palm oil.  Seasoning and other foods  Salted popcorn and pretzels. Onion salt, garlic salt, seasoned salt, table salt, and sea salt. Worcestershire sauce. Tartar sauce. Barbecue sauce. Teriyaki sauce. Soy sauce, including reduced-sodium. Steak sauce. Canned and packaged gravies. Fish sauce. Oyster sauce. Cocktail sauce. Horseradish that you find on the shelf. Ketchup. Mustard. Meat flavorings and tenderizers. Bouillon cubes. Hot sauce and Tabasco sauce. Premade or packaged marinades. Premade or packaged taco seasonings. Relishes. Regular salad dressings.  Where to find more information:   National Heart, Lung, and Blood Institute: www.nhlbi.nih.gov   American Heart Association: www.heart.org  Summary   The DASH eating plan is a healthy eating plan that has been shown to reduce high blood pressure (hypertension). It may also reduce your risk for type 2 diabetes, heart disease, and stroke.   With the   DASH eating plan, you should limit salt (sodium) intake to 2,300 mg a day. If you have hypertension, you may need to reduce your sodium intake to 1,500 mg a day.   When on the DASH eating plan, aim to eat more fresh fruits and vegetables, whole grains, lean proteins, low-fat dairy, and heart-healthy fats.   Work with your health care provider or diet and nutrition specialist (dietitian) to adjust your eating plan to your individual calorie needs.  This information is not intended to replace advice given to you by your health care provider. Make sure you discuss any questions you have with your health care provider.  Document Released: 07/19/2011 Document Revised: 07/23/2016 Document Reviewed: 07/23/2016  Elsevier Interactive Patient Education  2019 [employer]

## 2018-10-20 NOTE — Assessment & Plan Note (Signed)
Discussed that with his A1c---the neuropathy may be related to this No need for medications at this point---but gradual weight loss and care with eating and portion size (especially at his age)--should probably control this problem

## 2018-10-22 ENCOUNTER — Other Ambulatory Visit: Payer: Self-pay

## 2018-10-22 ENCOUNTER — Encounter: Payer: Self-pay | Admitting: Internal Medicine

## 2018-10-22 ENCOUNTER — Ambulatory Visit (INDEPENDENT_AMBULATORY_CARE_PROVIDER_SITE_OTHER): Payer: PPO | Admitting: Internal Medicine

## 2018-10-22 ENCOUNTER — Ambulatory Visit (INDEPENDENT_AMBULATORY_CARE_PROVIDER_SITE_OTHER)
Admission: RE | Admit: 2018-10-22 | Discharge: 2018-10-22 | Disposition: A | Discharge status: Home / Self Care | Payer: PPO | Source: Ambulatory Visit | Attending: Internal Medicine | Admitting: Internal Medicine

## 2018-10-22 VITALS — BP 110/60 | HR 109 | Temp 99.5°F | Resp 24 | Ht 67.0 in | Wt 159.0 lb

## 2018-10-22 DIAGNOSIS — J449 Chronic obstructive pulmonary disease, unspecified: Secondary | ICD-10-CM | POA: Diagnosis not present

## 2018-10-22 DIAGNOSIS — R509 Fever, unspecified: Secondary | ICD-10-CM

## 2018-10-22 LAB — POC INFLUENZA A&B (BINAX/QUICKVUE)
Influenza A, POC: NEGATIVE
Influenza B, POC: NEGATIVE

## 2018-10-22 NOTE — Assessment & Plan Note (Signed)
Does not seem to be exacerbated Will just continue symbicort

## 2018-10-22 NOTE — Progress Notes (Signed)
Subjective:    Patient ID: Leon Estes, male    DOB: 12/29/1930, 85 y.o.   MRN: [id number]  HPI Here due to fever  Awoke this morning with fever--- didn't check but felt warm Better with tylenol Has had a cough for 2-3 days---some yellow sputum but mostly clear No chills but some sweat when fever breaks Continues on symbicort for COPD---some increased breathing problems  No sore throat No ear pain No head pain  Current Outpatient Medications on File Prior to Visit  Medication Sig Dispense Refill  . aspirin EC 81 MG tablet Take 81 mg by mouth daily.    . budesonide-formoterol (SYMBICORT) 160-4.5 MCG/ACT inhaler Inhale 2 puffs into the lungs daily.    Marland Kitchen gabapentin (NEURONTIN) 300 MG capsule Take 300 mg by mouth at bedtime.    Marland Kitchen lisinopril-hydrochlorothiazide (PRINZIDE,ZESTORETIC) 20-25 MG per tablet Take 1 tablet by mouth daily.    . metoprolol succinate (TOPROL-XL) 25 MG 24 hr tablet Take 1 tablet (25 mg total) by mouth daily. 30 tablet 11  . pantoprazole (PROTONIX) 40 MG tablet     . simvastatin (ZOCOR) 40 MG tablet Take 40 mg by mouth every evening.    . traMADol (ULTRAM) 50 MG tablet 50 mg 2 (two) times daily as needed.      No current facility-administered medications on file prior to visit.     Allergies  Allergen Reactions  . Sulfa Antibiotics Rash  . Neosporin [Neomycin-Bacitracin Zn-Polymyx]   . Augmentin [Amoxicillin-Pot Clavulanate] Rash  . Terbinafine And Related Rash    Past Medical History:  Diagnosis Date  . Asthma   . BPH (benign prostatic hypertrophy)   . Cataract   . Chronic renal disease, stage III ([place])   . Chronic sinusitis   . COPD (chronic obstructive pulmonary disease) ([place])   . Coronary artery disease    2 stents  . Gall stones   . Hearing loss    DOES NOT WEAR HEARING AIDS  . High cholesterol   . Hypertension   . Macular degeneration    right eye    Past Surgical History:  Procedure Laterality Date  . APPENDECTOMY    .  CORONARY ANGIOPLASTY WITH STENT PLACEMENT  1998   2 stents  . TRANSURETHRAL RESECTION OF PROSTATE      Family History  Problem Relation Age of Onset  . Diabetes Father   . Heart disease Brother     Social History   Socioeconomic History  . Marital status: Married    Spouse name: Not on file  . Number of children: 4  . Years of education: Not on file  . Highest education level: Not on file  Occupational History  . 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 Financial resource strain: Not on file  . Food insecurity:    Worry: Not on file    Inability: Not on file  . Transportation needs:    Medical: Not on file    Non-medical: Not on file  Tobacco Use  . Smoking status: Former Smoker    Types: Cigarettes    Last attempt to quit: 08/13/2005    Years since quitting: 13.2  . Smokeless tobacco: Current User    Types: Chew  . Tobacco comment: discussed stopping this (only once in a while)  Substance and Sexual Activity  . Alcohol use: No  . Drug use: No  . Sexual activity: Not on file  Lifestyle  . Physical activity:  Days per week: Not on file    Minutes per session: Not on file  . Stress: Not on file  Relationships  . Social connections:    Talks on phone: Not on file    Gets together: Not on file    Attends religious service: Not on file    Active member of club or organization: Not on file    Attends meetings of clubs or organizations: Not on file    Relationship status: Not on file  . Intimate partner violence:    Fear of current or ex partner: Not on file    Emotionally abused: Not on file    Physically abused: Not on file    Forced sexual activity: Not on file  Other Topics Concern  . Not on file  Social History Narrative   No living will   Wife, then daughter should make decisions   Would accept resuscitation   Would probably accept tube feeds   Review of Systems  No vomiting or diarrhea Appetite is the same---only 2 meals per day  mostly No muscle aches No recent travel     Objective:   Physical Exam  Constitutional: He appears well-developed. No distress.  HENT:  Mouth/Throat: Oropharynx is clear and moist. No oropharyngeal exudate.  No sinus tenderness TMs normal Mild nasal congestion  Neck: No thyromegaly present.  Respiratory: Effort normal.  Tachypnea Decreased breath sounds with rhonchi at left base Not tight or wheezy  Lymphadenopathy:    He has no cervical adenopathy.           Assessment & Plan:

## 2018-10-22 NOTE — Assessment & Plan Note (Addendum)
With lung findings suspicious for pneumonia Will check CXR  CXR normal No evidence of bacterial infection Discussed supportive care Will consider empiric antibiotic if he worsens

## 2018-10-22 NOTE — Patient Instructions (Signed)
You seem to have a viral infection---but not the flu. Continue tylenol and you can use over the counter cough medicine if needed. Continue your regular inhalers. Let me know if you are worsening or not improving by next week.

## 2019-01-30 DIAGNOSIS — N183 Chronic kidney disease, stage 3 (moderate): Secondary | ICD-10-CM | POA: Diagnosis not present

## 2019-01-30 DIAGNOSIS — E7800 Pure hypercholesterolemia, unspecified: Secondary | ICD-10-CM | POA: Diagnosis not present

## 2019-01-30 DIAGNOSIS — I10 Essential (primary) hypertension: Secondary | ICD-10-CM | POA: Diagnosis not present

## 2019-01-30 DIAGNOSIS — J449 Chronic obstructive pulmonary disease, unspecified: Secondary | ICD-10-CM | POA: Diagnosis not present

## 2019-02-23 ENCOUNTER — Encounter: Payer: PPO | Admitting: Internal Medicine

## 2019-02-23 DIAGNOSIS — E7800 Pure hypercholesterolemia, unspecified: Secondary | ICD-10-CM | POA: Diagnosis not present

## 2019-02-23 DIAGNOSIS — N183 Chronic kidney disease, stage 3 (moderate): Secondary | ICD-10-CM | POA: Diagnosis not present

## 2019-03-09 ENCOUNTER — Other Ambulatory Visit: Payer: Self-pay | Admitting: Cardiovascular Disease

## 2019-04-15 ENCOUNTER — Other Ambulatory Visit: Payer: Self-pay

## 2019-04-15 ENCOUNTER — Ambulatory Visit: Payer: PPO | Admitting: Cardiovascular Disease

## 2019-04-15 ENCOUNTER — Encounter: Payer: Self-pay | Admitting: Cardiovascular Disease

## 2019-04-15 VITALS — BP 116/78 | HR 75 | Ht 67.0 in | Wt 165.0 lb

## 2019-04-15 DIAGNOSIS — I10 Essential (primary) hypertension: Secondary | ICD-10-CM

## 2019-04-15 DIAGNOSIS — E782 Mixed hyperlipidemia: Secondary | ICD-10-CM

## 2019-04-15 DIAGNOSIS — I2510 Atherosclerotic heart disease of native coronary artery without angina pectoris: Secondary | ICD-10-CM | POA: Diagnosis not present

## 2019-04-15 NOTE — Progress Notes (Signed)
Cardiology Office Note   Date:  04/15/2019   ID:  Leon Estes, Leon Estes Jul 28, 1931, MRN [id number]  PCP:  Venia Carbon, MD  Cardiologist:   Mertie Moores, MD / previously saw [name], MD at [facility] Complaint  Patient presents with  . Coronary Artery Disease   Problem list: 1. Coronary artery disease-status post stenting of the right coronary artery in 1997 2. COPD 3. Hyperlipidemia     Gaylin REGENALD MATURO is a 85 y.o. male who presents for follow up of his CP He had am MI in 1997.  Had 2 stents placed by Dr. Glade Lloyd. Hs seen [facility] in the past, wants to see a cardiologist here in [place] .  Has done well,  No further of CP.  Exercises regularly,  Mows 3 acres.   Goes bowling once a week.  Also has some pain down his leg.  Able to weed-eat without any problems  Sees Dr. Delfina Redwood. Was recently found to have mild anemia.  Was started on iron at that time .   January 13, 2016:  Doing well.  Still very active.  Mows his 3 acres and weed-eats .   Bowls on wednesdays   Aug. 28, 2018: Doing well,  No CP or dyspnea.  Still bowling - now bowls around 180 .  Sees Dr. Delfina Redwood  - manages his lipids   April 15, 2019: [name] is seen today for follow-up visit. Doing well.   Has not been bowling - closed for COVID No CP or dyspnea  Has some dyspnea due to his COPD .  - takes an inhaler regularly  Has had some leg pain .  Has resolve   Past Medical History:  Diagnosis Date  . Asthma   . BPH (benign prostatic hypertrophy)   . Cataract   . Chronic renal disease, stage III ([place])   . Chronic sinusitis   . COPD (chronic obstructive pulmonary disease) ([place])   . Coronary artery disease    2 stents  . Gall stones   . Hearing loss    DOES NOT WEAR HEARING AIDS  . High cholesterol   . Hypertension   . Macular degeneration    right eye    Past Surgical History:  Procedure Laterality Date  . APPENDECTOMY    . CORONARY ANGIOPLASTY WITH STENT PLACEMENT  1998   2 stents   . TRANSURETHRAL RESECTION OF PROSTATE       Current Outpatient Medications  Medication Sig Dispense Refill  . aspirin EC 81 MG tablet Take 81 mg by mouth daily.    . budesonide-formoterol (SYMBICORT) 160-4.5 MCG/ACT inhaler Inhale 2 puffs into the lungs daily.    Marland Kitchen gabapentin (NEURONTIN) 300 MG capsule Take 300 mg by mouth at bedtime.    Marland Kitchen lisinopril-hydrochlorothiazide (PRINZIDE,ZESTORETIC) 20-25 MG per tablet Take 1 tablet by mouth daily.    . metoprolol succinate (TOPROL-XL) 25 MG 24 hr tablet TAKE 1 TABLET BY MOUTH ONCE DAILY 30 tablet 2  . pantoprazole (PROTONIX) 40 MG tablet     . simvastatin (ZOCOR) 40 MG tablet Take 40 mg by mouth every evening.    . traMADol (ULTRAM) 50 MG tablet 50 mg 2 (two) times daily as needed.      No current facility-administered medications for this visit.     Allergies:   Sulfa antibiotics, Neosporin [neomycin-bacitracin zn-polymyx], Augmentin [amoxicillin-pot clavulanate], and Terbinafine and related    Social History:  The patient  reports that he quit smoking about  13 years ago. His smoking use included cigarettes. His smokeless tobacco use includes chew. He reports that he does not drink alcohol or use drugs.   Family History:  The patient's family history includes Diabetes in his father; Heart disease in his brother.    ROS:  Please see the history of present illness.  Noted in HPI.  Otherwise review of systems is negative.   Physical Exam: Blood pressure 116/78, pulse 75, height 5\' 7"  (1.702 m), weight 165 lb (74.8 kg), SpO2 95 %.  GEN:  Elderly male,   NAD  HEENT: Normal NECK: No JVD; No carotid bruits LYMPHATICS: No lymphadenopathy CARDIAC: RRR,   Occasional premature beats.  RESPIRATORY:  Clear to auscultation reduced breath sound in bases.  ABDOMEN: Soft, non-tender, non-distended MUSCULOSKELETAL:  No edema; No deformity  SKIN: Warm and dry NEUROLOGIC:  Alert and oriented x 3   EKG:   April 15, 2019: Normal sinus rhythm at  75 beats minute.  Frequent premature ventricular contractions.  Right bundle branch block.   Recent Labs: 10/09/2018: ALT 25; BUN 28; Creatinine, Ser 1.64; Hemoglobin 12.1; Platelets 286.0; Potassium 3.8; Sodium 140    Lipid Panel No results found for: CHOL, TRIG, HDL, CHOLHDL, VLDL, LDLCALC, LDLDIRECT    Wt Readings from Last 3 Encounters:  04/15/19 165 lb (74.8 kg)  10/22/18 159 lb (72.1 kg)  10/20/18 160 lb (72.6 kg)      Other studies Reviewed: Additional studies/ records that were reviewed today include: . Review of the above records demonstrates:    ASSESSMENT AND PLAN:  1. Coronary artery disease-status post stenting of the right coronary artery in 1997 No angina .   Cont meds   On Toprol XL 25 mg a day - HR is well  No syncopal episodes .   2. COPD - stable  3. Hyperlipidemia -  Stable,  Managed by primary    Current medicines are reviewed at length with the patient today.  The patient does not have concerns regarding medicines.  The following changes have been made:  no change  Labs/ tests ordered today include:  No orders of the defined types were placed in this encounter.   Disposition:   FU with me in 1 year     Mertie Moores, MD  04/15/2019 3:07 PM    [facility] [place], [place], [place]  [address] Phone: [phone]; Fax: [phone]

## 2019-04-15 NOTE — Patient Instructions (Addendum)
Medication Instructions:  No changes If you need a refill on your cardiac medications before your next appointment, please call your pharmacy.   Lab work: none If you have labs (blood work) drawn today and your tests are completely normal, you will receive your results only by: Marland Kitchen MyChart Message (if you have MyChart) OR . A paper copy in the mail If you have any lab test that is abnormal or we need to change your treatment, we will call you to review the results.  Testing/Procedures: none  . Follow-Up: with Pecolia Ades, NP in one year  Any Other Special Instructions Will Be Listed Below (If Applicable).

## 2019-06-18 ENCOUNTER — Other Ambulatory Visit: Payer: Self-pay

## 2019-06-18 ENCOUNTER — Ambulatory Visit: Payer: PPO

## 2019-06-18 ENCOUNTER — Ambulatory Visit: Payer: PPO | Admitting: Podiatry

## 2019-06-18 ENCOUNTER — Encounter: Payer: Self-pay | Admitting: Podiatry

## 2019-06-18 DIAGNOSIS — L309 Dermatitis, unspecified: Secondary | ICD-10-CM

## 2019-06-18 DIAGNOSIS — M79671 Pain in right foot: Secondary | ICD-10-CM

## 2019-06-18 DIAGNOSIS — L989 Disorder of the skin and subcutaneous tissue, unspecified: Secondary | ICD-10-CM

## 2019-06-20 NOTE — Progress Notes (Signed)
Subjective:  Patient ID: Leon Estes, male    DOB: 06-22-1931,  MRN: [id number]  Chief Complaint  Patient presents with  . Foot Pain    Patient presents today for rash on right foot x 1 month and small rash starting on left foot.  He states his left hallux is sore to touch at the tip of toenail and his right toenail is sore as well.  He has been using Hydrocortisone cream and calamine lotion to treat rash    85 y.o. male presents with the above complaint.  Patient states that he does not know how the rash started.  He states that it comes on regularly at no specific interval.  Patient has a rash on the dorsal aspect of the right foot as well as right medial and left medial aspect of the leg.  Patient states that he does not have any autoimmune disease including lupus psoriasis or any other conditions.  Patient states that he does not follow-up with a rheumatologist or dermatologist for this.  He states that he is tried hydrocortisone cream as well as Calamine lotion.  This rash has been present for about a month.  It is very sporadic in nature with irregular interval.  No patient denies any itching to the rash.  He denies any nausea fever chills vomiting.   Review of Systems: Negative except as noted in the HPI. Denies N/V/F/Ch.  Past Medical History:  Diagnosis Date  . Asthma   . BPH (benign prostatic hypertrophy)   . Cataract   . Chronic renal disease, stage III   . Chronic sinusitis   . COPD (chronic obstructive pulmonary disease) ([place])   . Coronary artery disease    2 stents  . Gall stones   . Hearing loss    DOES NOT WEAR HEARING AIDS  . High cholesterol   . Hypertension   . Macular degeneration    right eye    Current Outpatient Medications:  .  aspirin EC 81 MG tablet, Take 81 mg by mouth daily., Disp: , Rfl:  .  budesonide-formoterol (SYMBICORT) 160-4.5 MCG/ACT inhaler, Inhale 2 puffs into the lungs daily., Disp: , Rfl:  .  gabapentin (NEURONTIN) 300 MG capsule, Take  300 mg by mouth at bedtime., Disp: , Rfl:  .  lisinopril-hydrochlorothiazide (PRINZIDE,ZESTORETIC) 20-25 MG per tablet, Take 1 tablet by mouth daily., Disp: , Rfl:  .  metoprolol succinate (TOPROL-XL) 25 MG 24 hr tablet, TAKE 1 TABLET BY MOUTH ONCE DAILY, Disp: 30 tablet, Rfl: 2 .  pantoprazole (PROTONIX) 40 MG tablet, , Disp: , Rfl:  .  simvastatin (ZOCOR) 40 MG tablet, Take 40 mg by mouth every evening., Disp: , Rfl:  .  tamsulosin (FLOMAX) 0.4 MG CAPS capsule, Take 0.4 mg by mouth daily., Disp: , Rfl:  .  traMADol (ULTRAM) 50 MG tablet, 50 mg 2 (two) times daily as needed. , Disp: , Rfl:   Social History   Tobacco Use  Smoking Status Former Smoker  . Types: Cigarettes  . Quit date: 08/13/2005  . Years since quitting: 13.8  Smokeless Tobacco Current User  . Types: Chew  Tobacco Comment   discussed stopping this (only once in a while)    Allergies  Allergen Reactions  . Sulfa Antibiotics Rash  . Neosporin [Neomycin-Bacitracin Zn-Polymyx]   . Augmentin [Amoxicillin-Pot Clavulanate] Rash  . Terbinafine And Related Rash   Objective:  There were no vitals filed for this visit. There is no height or weight on file to  calculate BMI. Constitutional Well developed. Well nourished.  Vascular Dorsalis pedis pulses palpable bilaterally. Posterior tibial pulses palpable bilaterally. Capillary refill normal to all digits.  No cyanosis or clubbing noted. Pedal hair growth normal.  Neurologic Normal speech. Oriented to person, place, and time. Epicritic sensation to light touch grossly present bilaterally.  Dermatologic  no pain on palpation to the rash.  The rash does not blanch.  It appears to have petechiae type of formation.  It is not raised.  The rash has a uniform consistency.  It does not appear to have any signs of malignancy.  Orthopedic: Normal joint ROM without pain or crepitus bilaterally. No visible deformities. No bony tenderness.   Radiographs: None Assessment:   1.  Right foot pain   2. Dermatitis   3. Skin lesion of foot    Plan:  Patient was evaluated and treated and all questions answered.  Dermatitis of the right dorsal foot/leg/left leg -I explained to the patient of possible causes including atopic dermatitis psoriasis possibly autoimmune such as lupus causes.  I believe the patient will benefit from a biopsy of the dorsal foot rash in order to properly assess nature of the dermatological manifestation.  The biopsy was obtained utilizing punch 3 mm as described below -After informed written consent was obtained, using Betadine for cleansing and 1% Lidocaine without epinephrine for anesthetic, with sterile technique a 3 mm punch biopsy was used to obtain a biopsy specimen of the lesion. Hemostasis was obtained by pressure and wound was 3-0 nylon sutured. Antibiotic dressing is applied, and wound care instructions provided. Be alert for any signs of cutaneous infection. The specimen is labeled and sent to pathology for evaluation. The procedure was well tolerated without complications. -I will see the patient back in 2 weeks to discuss the results.   No follow-ups on file.

## 2019-06-22 ENCOUNTER — Encounter: Payer: Self-pay | Admitting: Podiatry

## 2019-06-25 ENCOUNTER — Encounter: Payer: Self-pay | Admitting: Podiatry

## 2019-06-25 ENCOUNTER — Other Ambulatory Visit: Payer: Self-pay

## 2019-06-25 ENCOUNTER — Ambulatory Visit: Payer: PPO | Admitting: Podiatry

## 2019-06-25 DIAGNOSIS — L989 Disorder of the skin and subcutaneous tissue, unspecified: Secondary | ICD-10-CM | POA: Diagnosis not present

## 2019-06-25 DIAGNOSIS — M79671 Pain in right foot: Secondary | ICD-10-CM | POA: Diagnosis not present

## 2019-06-25 DIAGNOSIS — L309 Dermatitis, unspecified: Secondary | ICD-10-CM

## 2019-06-25 MED ORDER — METHYLPREDNISOLONE 4 MG PO TBPK: ORAL_TABLET | ORAL | 0 refills | Status: DC

## 2019-06-25 MED ORDER — NONFORMULARY OR COMPOUNDED ITEM: 2 refills | Status: DC

## 2019-06-26 ENCOUNTER — Encounter: Payer: Self-pay | Admitting: Podiatry

## 2019-06-26 NOTE — Progress Notes (Signed)
Subjective:  Patient ID: Leon Estes, male    DOB: [birth year],  MRN: [id number]  Chief Complaint  Patient presents with  . Foot Pain    Patient still having pains in foot, very sensitive to touch, skin peeling and keeps him awak at night    85 y.o. male presents with the above complaint.  Patient states is still continuing to hurt.  Patient is here for follow-up for a biopsy that was done last week.  He wanted to know if there is anything else that could be done.  Today I will review the biopsy and what findings were.  He denies any other acute complaints.  He still denies any itching to the area.   Review of Systems: Negative except as noted in the HPI. Denies N/V/F/Ch.  Past Medical History:  Diagnosis Date  . Asthma   . BPH (benign prostatic hypertrophy)   . Cataract   . Chronic renal disease, stage III   . Chronic sinusitis   . COPD (chronic obstructive pulmonary disease) ([place])   . Coronary artery disease    2 stents  . Gall stones   . Hearing loss    DOES NOT WEAR HEARING AIDS  . High cholesterol   . Hypertension   . Macular degeneration    right eye    Current Outpatient Medications:  .  aspirin EC 81 MG tablet, Take 81 mg by mouth daily., Disp: , Rfl:  .  budesonide-formoterol (SYMBICORT) 160-4.5 MCG/ACT inhaler, Inhale 2 puffs into the lungs daily., Disp: , Rfl:  .  gabapentin (NEURONTIN) 300 MG capsule, Take 300 mg by mouth at bedtime., Disp: , Rfl:  .  lisinopril-hydrochlorothiazide (PRINZIDE,ZESTORETIC) 20-25 MG per tablet, Take 1 tablet by mouth daily., Disp: , Rfl:  .  methylPREDNISolone (MEDROL DOSEPAK) 4 MG TBPK tablet, Use as directed, Disp: 1 each, Rfl: 0 .  metoprolol succinate (TOPROL-XL) 25 MG 24 hr tablet, TAKE 1 TABLET BY MOUTH ONCE DAILY, Disp: 30 tablet, Rfl: 2 .  NONFORMULARY OR COMPOUNDED ITEM, See pharmacy note, Disp: 120 each, Rfl: 2 .  pantoprazole (PROTONIX) 40 MG tablet, , Disp: , Rfl:  .  simvastatin (ZOCOR) 40 MG tablet, Take 40 mg by  mouth every evening., Disp: , Rfl:  .  tamsulosin (FLOMAX) 0.4 MG CAPS capsule, Take 0.4 mg by mouth daily., Disp: , Rfl:  .  traMADol (ULTRAM) 50 MG tablet, 50 mg 2 (two) times daily as needed. , Disp: , Rfl:   Social History   Tobacco Use  Smoking Status Former Smoker  . Types: Cigarettes  . Quit date: 08/13/2005  . Years since quitting: 13.8  Smokeless Tobacco Current User  . Types: Chew  Tobacco Comment   discussed stopping this (only once in a while)    Allergies  Allergen Reactions  . Sulfa Antibiotics Rash  . Neosporin [Neomycin-Bacitracin Zn-Polymyx]   . Augmentin [Amoxicillin-Pot Clavulanate] Rash  . Terbinafine And Related Rash   Objective:  There were no vitals filed for this visit. There is no height or weight on file to calculate BMI. Constitutional Well developed. Well nourished.  Vascular Dorsalis pedis pulses palpable bilaterally. Posterior tibial pulses palpable bilaterally. Capillary refill normal to all digits.  No cyanosis or clubbing noted. Pedal hair growth normal.  Neurologic Normal speech. Oriented to person, place, and time. Epicritic sensation to light touch grossly present bilaterally.  Dermatologic no pain on palpation to the rash.  The rash does not blanch.  It appears to have petechiae type  of formation.  It is not raised.  The rash has a uniform consistency.  It does not appear to have any signs of malignancy.  Biopsy site is healing well and is healed.  Sutures intact.  The rash has not changed in size.  Orthopedic: Normal joint ROM without pain or crepitus bilaterally. No visible deformities. No bony tenderness.   Radiographs: None Assessment:  No diagnosis found. Plan:  Patient was evaluated and treated and all questions answered.  Dermatitis of the right dorsal foot/leg/left leg -I extensively discussed the results of the biopsy site of the left foot.  The results showed that it is related to arthropod bite reaction.  Unfortunately  patient does not recall any bug bites however [place] and there is a possibility that it could be a result of that.  The second most differential diagnosis was atopic dermatitis. -I have asked the patient to take Medrol Dosepak and see if this helps resolve some of the pain associated with it. -Patient also complains of neuropathic pain and for this I have sent the patient for neuropathic cream to be dispensed to their house by specialty pharmacy.   No follow-ups on file.

## 2019-07-01 ENCOUNTER — Other Ambulatory Visit: Payer: Self-pay | Admitting: Cardiovascular Disease

## 2019-07-02 ENCOUNTER — Ambulatory Visit: Payer: PPO | Admitting: Podiatry

## 2019-07-13 ENCOUNTER — Other Ambulatory Visit: Payer: Self-pay | Admitting: Podiatry

## 2019-07-14 ENCOUNTER — Encounter: Payer: Self-pay | Admitting: Internal Medicine

## 2019-07-14 ENCOUNTER — Ambulatory Visit (INDEPENDENT_AMBULATORY_CARE_PROVIDER_SITE_OTHER): Payer: PPO | Admitting: Internal Medicine

## 2019-07-14 ENCOUNTER — Other Ambulatory Visit: Payer: Self-pay

## 2019-07-14 VITALS — BP 104/64 | HR 62 | Temp 98.8°F | Ht 67.0 in | Wt 161.0 lb

## 2019-07-14 DIAGNOSIS — R233 Spontaneous ecchymoses: Secondary | ICD-10-CM | POA: Diagnosis not present

## 2019-07-14 DIAGNOSIS — R200 Anesthesia of skin: Secondary | ICD-10-CM

## 2019-07-14 LAB — CBC
HCT: 36.9 % — ABNORMAL LOW (ref 39.0–52.0)
Hemoglobin: 12 g/dL — ABNORMAL LOW (ref 13.0–17.0)
MCHC: 32.7 g/dL (ref 30.0–36.0)
MCV: 92.3 fl (ref 78.0–100.0)
Platelets: 251 10*3/uL (ref 150.0–400.0)
RBC: 4 Mil/uL — ABNORMAL LOW (ref 4.22–5.81)
RDW: 14.1 % (ref 11.5–15.5)
WBC: 9.4 10*3/uL (ref 4.0–10.5)

## 2019-07-14 LAB — COMPREHENSIVE METABOLIC PANEL
ALT: 18 U/L (ref 0–53)
AST: 21 U/L (ref 0–37)
Albumin: 3.9 g/dL (ref 3.5–5.2)
Alkaline Phosphatase: 64 U/L (ref 39–117)
BUN: 28 mg/dL — ABNORMAL HIGH (ref 6–23)
CO2: 27 mEq/L (ref 19–32)
Calcium: 9.3 mg/dL (ref 8.4–10.5)
Chloride: 104 mEq/L (ref 96–112)
Creatinine, Ser: 1.58 mg/dL — ABNORMAL HIGH (ref 0.40–1.50)
GFR: 41.76 mL/min — ABNORMAL LOW (ref >60.00)
Glucose, Bld: 90 mg/dL (ref 70–99)
Potassium: 3.8 mEq/L (ref 3.5–5.1)
Sodium: 139 mEq/L (ref 135–145)
Total Bilirubin: 0.4 mg/dL (ref 0.2–1.2)
Total Protein: 7.1 g/dL (ref 6.0–8.3)

## 2019-07-14 LAB — VITAMIN B12: Vitamin B-12: 394 pg/mL (ref 211–911)

## 2019-07-14 LAB — T4, FREE: Free T4: 0.91 ng/dL (ref 0.60–1.60)

## 2019-07-14 LAB — HEMOGLOBIN A1C: Hgb A1c MFr Bld: 6.9 % — ABNORMAL HIGH (ref 4.6–6.5)

## 2019-07-14 NOTE — Assessment & Plan Note (Signed)
On leg Urticarial rash on back Not clear if this could be related to the steroid Rx Will have him try OTC cetirizine for now

## 2019-07-14 NOTE — Assessment & Plan Note (Signed)
Mostly just right great toe Not sure why podiatrist would treat this with steroids ?related to his sugars? Will recheck blood work

## 2019-07-14 NOTE — Progress Notes (Signed)
Subjective:    Patient ID: Leon Estes, male    DOB: 1931/01/18, 85 y.o.   MRN: [id number]  HPI Here due to rash  Had a foot problem--under big toe felt uncomfortable with stinging and burning Saw Dr Patel--podiatrist Given methylprednisolone and neuropathy cream This helped his foot Then started with rash on right calf and some on his back Some itching--not painful  No Rx for rash No sig change since it started last week  Current Outpatient Medications on File Prior to Visit  Medication Sig Dispense Refill  . aspirin EC 81 MG tablet Take 81 mg by mouth daily.    . budesonide-formoterol (SYMBICORT) 160-4.5 MCG/ACT inhaler Inhale 2 puffs into the lungs daily.    Marland Kitchen gabapentin (NEURONTIN) 300 MG capsule Take 300 mg by mouth at bedtime.    Marland Kitchen lisinopril-hydrochlorothiazide (PRINZIDE,ZESTORETIC) 20-25 MG per tablet Take 1 tablet by mouth daily.    . metoprolol succinate (TOPROL-XL) 25 MG 24 hr tablet TAKE 1 TABLET BY MOUTH ONCE DAILY 30 tablet 11  . NONFORMULARY OR COMPOUNDED ITEM See pharmacy note 120 each 2  . pantoprazole (PROTONIX) 40 MG tablet     . simvastatin (ZOCOR) 40 MG tablet Take 40 mg by mouth every evening.    . tamsulosin (FLOMAX) 0.4 MG CAPS capsule Take 0.4 mg by mouth daily.    . traMADol (ULTRAM) 50 MG tablet 50 mg 2 (two) times daily as needed.      No current facility-administered medications on file prior to visit.     Allergies  Allergen Reactions  . Sulfa Antibiotics Rash  . Neosporin [Neomycin-Bacitracin Zn-Polymyx]   . Augmentin [Amoxicillin-Pot Clavulanate] Rash  . Terbinafine And Related Rash    Past Medical History:  Diagnosis Date  . Asthma   . BPH (benign prostatic hypertrophy)   . Cataract   . Chronic renal disease, stage III   . Chronic sinusitis   . COPD (chronic obstructive pulmonary disease) ([place])   . Coronary artery disease    2 stents  . Gall stones   . Hearing loss    DOES NOT WEAR HEARING AIDS  . High cholesterol   .  Hypertension   . Macular degeneration    right eye    Past Surgical History:  Procedure Laterality Date  . APPENDECTOMY    . CORONARY ANGIOPLASTY WITH STENT PLACEMENT  1998   2 stents  . TRANSURETHRAL RESECTION OF PROSTATE      Family History  Problem Relation Age of Onset  . Diabetes Father   . Heart disease Brother     Social History   Socioeconomic History  . Marital status: Married    Spouse name: Not on file  . Number of children: 4  . Years of education: Not on file  . Highest education level: Not on file  Occupational History  . 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 Financial resource strain: Not on file  . Food insecurity    Worry: Not on file    Inability: Not on file  . Transportation needs    Medical: Not on file    Non-medical: Not on file  Tobacco Use  . Smoking status: Former Smoker    Types: Cigarettes    Quit date: 08/13/2005    Years since quitting: 13.9  . Smokeless tobacco: Current User    Types: Chew  . Tobacco comment: discussed stopping this (only once in a while)  Substance and Sexual Activity  .  Alcohol use: No  . Drug use: No  . Sexual activity: Not on file  Lifestyle  . Physical activity    Days per week: Not on file    Minutes per session: Not on file  . Stress: Not on file  Relationships  . Social Herbalist on phone: Not on file    Gets together: Not on file    Attends religious service: Not on file    Active member of club or organization: Not on file    Attends meetings of clubs or organizations: Not on file    Relationship status: Not on file  . Intimate partner violence    Fear of current or ex partner: Not on file    Emotionally abused: Not on file    Physically abused: Not on file    Forced sexual activity: Not on file  Other Topics Concern  . Not on file  Social History Narrative   No living will   Wife, then daughter should make decisions   Would accept resuscitation   Would  probably accept tube feeds   Review of Systems  No illness No fever     Objective:   Physical Exam  Constitutional: He appears well-developed. No distress.  Musculoskeletal:     Comments: Discolored right great toe with sensation change  Skin:  Non blanching petechial rash on medial right calf and lower thigh Different urticarial type rash over most of his back           Assessment & Plan:

## 2019-07-14 NOTE — Patient Instructions (Signed)
Try over the counter cetirizine 10mg  daily at bedtime to help the rash and itching. If the rash worsens, we should probably set you up with a dermatologist

## 2019-07-16 ENCOUNTER — Ambulatory Visit: Payer: PPO | Admitting: Podiatry

## 2019-07-30 ENCOUNTER — Other Ambulatory Visit: Payer: Self-pay

## 2019-07-30 ENCOUNTER — Encounter: Payer: Self-pay | Admitting: Internal Medicine

## 2019-07-30 ENCOUNTER — Ambulatory Visit (INDEPENDENT_AMBULATORY_CARE_PROVIDER_SITE_OTHER): Payer: PPO | Admitting: Internal Medicine

## 2019-07-30 DIAGNOSIS — R21 Rash and other nonspecific skin eruption: Secondary | ICD-10-CM | POA: Diagnosis not present

## 2019-07-30 MED ORDER — KETOCONAZOLE 2 % EX CREA: 1.0000 "application " | TOPICAL_CREAM | Freq: Two times a day (BID) | CUTANEOUS | 1 refills | Status: DC | PRN

## 2019-07-30 MED ORDER — FLUCONAZOLE 100 MG PO TABS: 100.0000 mg | ORAL_TABLET | Freq: Every day | ORAL | 2 refills | Status: DC

## 2019-07-30 NOTE — Progress Notes (Signed)
Subjective:    Patient ID: Leon Estes, male    DOB: [birth year], 85 y.o.   MRN: [id number]  HPI Here due to ongoing rash  This visit occurred during the SARS-CoV-2 public health emergency.  Safety protocols were in place, including screening questions prior to the visit, additional usage of staff PPE, and extensive cleaning of exam room while observing appropriate contact time as indicated for disinfecting solutions.   Back is still stinging and itching Cetirizine not helping much  Leg rash not as prominent--but still on his foot (right)  Current Outpatient Medications on File Prior to Visit  Medication Sig Dispense Refill  . aspirin EC 81 MG tablet Take 81 mg by mouth daily.    . budesonide-formoterol (SYMBICORT) 160-4.5 MCG/ACT inhaler Inhale 2 puffs into the lungs daily.    . cetirizine (ZYRTEC) 10 MG tablet Take 10 mg by mouth daily.    Marland Kitchen gabapentin (NEURONTIN) 300 MG capsule Take 300 mg by mouth at bedtime.    Marland Kitchen lisinopril-hydrochlorothiazide (PRINZIDE,ZESTORETIC) 20-25 MG per tablet Take 1 tablet by mouth daily.    . metoprolol succinate (TOPROL-XL) 25 MG 24 hr tablet TAKE 1 TABLET BY MOUTH ONCE DAILY 30 tablet 11  . NONFORMULARY OR COMPOUNDED ITEM See pharmacy note 120 each 2  . pantoprazole (PROTONIX) 40 MG tablet     . simvastatin (ZOCOR) 40 MG tablet Take 40 mg by mouth every evening.    . tamsulosin (FLOMAX) 0.4 MG CAPS capsule Take 0.4 mg by mouth daily.    . traMADol (ULTRAM) 50 MG tablet 50 mg 2 (two) times daily as needed.      No current facility-administered medications on file prior to visit.    Allergies  Allergen Reactions  . Sulfa Antibiotics Rash  . Neosporin [Neomycin-Bacitracin Zn-Polymyx]   . Augmentin [Amoxicillin-Pot Clavulanate] Rash  . Terbinafine And Related Rash    Past Medical History:  Diagnosis Date  . Asthma   . BPH (benign prostatic hypertrophy)   . Cataract   . Chronic renal disease, stage III   . Chronic sinusitis   . COPD  (chronic obstructive pulmonary disease) ([place])   . Coronary artery disease    2 stents  . Gall stones   . Hearing loss    DOES NOT WEAR HEARING AIDS  . High cholesterol   . Hypertension   . Macular degeneration    right eye    Past Surgical History:  Procedure Laterality Date  . APPENDECTOMY    . CORONARY ANGIOPLASTY WITH STENT PLACEMENT  1998   2 stents  . TRANSURETHRAL RESECTION OF PROSTATE      Family History  Problem Relation Age of Onset  . Diabetes Father   . Heart disease Brother     Social History   Socioeconomic History  . Marital status: Married    Spouse name: Not on file  . Number of children: 4  . Years of education: Not on file  . Highest education level: Not on file  Occupational History  . Occupation: Drove truck and concrete work  Tobacco Use  . Smoking status: Former Smoker    Types: Cigarettes    Quit date: 08/13/2005    Years since quitting: 13.9  . Smokeless tobacco: Current User    Types: Chew  . Tobacco comment: discussed stopping this (only once in a while)  Substance and Sexual Activity  . Alcohol use: No  . Drug use: No  . Sexual activity: Not on file  Other  Topics Concern  . Not on file  Social History Narrative   No living will   Wife, then daughter should make decisions   Would accept resuscitation   Would probably accept tube feeds   Social Determinants of Health   Financial Resource Strain:   . Difficulty of Paying Living Expenses: Not on file  Food Insecurity:   . Worried About Charity fundraiser in the Last Year: Not on file  . Ran Out of Food in the Last Year: Not on file  Transportation Needs:   . Lack of Transportation (Medical): Not on file  . Lack of Transportation (Non-Medical): Not on file  Physical Activity:   . Days of Exercise per Week: Not on file  . Minutes of Exercise per Session: Not on file  Stress:   . Feeling of Stress : Not on file  Social Connections:   . Frequency of Communication with Friends  and Family: Not on file  . Frequency of Social Gatherings with Friends and Family: Not on file  . Attends Religious Services: Not on file  . Active Member of Clubs or Organizations: Not on file  . Attends Archivist Meetings: Not on file  . Marital Status: Not on file  Intimate Partner Violence:   . Fear of Current or Ex-Partner: Not on file  . Emotionally Abused: Not on file  . Physically Abused: Not on file  . Sexually Abused: Not on file   Review of Systems No other new medications No fever Eating okay    Objective:   Physical Exam  Constitutional: He appears well-developed. No distress.  Skin:  Widespread maculopapular rash--mostly coalesced on back It does blanch Doesn't look urticarial now  Still with some right foot rash on dorsum and up posterior calf. Not petechial           Assessment & Plan:

## 2019-07-30 NOTE — Assessment & Plan Note (Signed)
Still on foot/calf and mostly back Now this looks fungal--which makes more sense after the steroid treatment Will try fluconazole for a week Ketoconazole cram for the foot/calf If persists, will need to see derm

## 2019-08-08 ENCOUNTER — Other Ambulatory Visit: Payer: Self-pay | Admitting: Internal Medicine

## 2019-09-23 ENCOUNTER — Other Ambulatory Visit: Payer: Self-pay | Admitting: Internal Medicine

## 2019-10-07 DIAGNOSIS — D485 Neoplasm of uncertain behavior of skin: Secondary | ICD-10-CM | POA: Diagnosis not present

## 2019-10-07 DIAGNOSIS — L259 Unspecified contact dermatitis, unspecified cause: Secondary | ICD-10-CM | POA: Diagnosis not present

## 2019-10-07 DIAGNOSIS — L309 Dermatitis, unspecified: Secondary | ICD-10-CM | POA: Diagnosis not present

## 2019-10-08 DIAGNOSIS — Z1389 Encounter for screening for other disorder: Secondary | ICD-10-CM | POA: Diagnosis not present

## 2019-10-08 DIAGNOSIS — Z0000 Encounter for general adult medical examination without abnormal findings: Secondary | ICD-10-CM | POA: Diagnosis not present

## 2019-11-04 DIAGNOSIS — L570 Actinic keratosis: Secondary | ICD-10-CM | POA: Diagnosis not present

## 2019-11-04 DIAGNOSIS — L309 Dermatitis, unspecified: Secondary | ICD-10-CM | POA: Diagnosis not present

## 2020-01-13 ENCOUNTER — Encounter: Payer: Self-pay | Admitting: Internal Medicine

## 2020-01-13 ENCOUNTER — Ambulatory Visit (INDEPENDENT_AMBULATORY_CARE_PROVIDER_SITE_OTHER): Payer: PPO | Admitting: Internal Medicine

## 2020-01-13 ENCOUNTER — Other Ambulatory Visit: Payer: Self-pay

## 2020-01-13 DIAGNOSIS — N401 Enlarged prostate with lower urinary tract symptoms: Secondary | ICD-10-CM | POA: Diagnosis not present

## 2020-01-13 DIAGNOSIS — I10 Essential (primary) hypertension: Secondary | ICD-10-CM

## 2020-01-13 DIAGNOSIS — N400 Enlarged prostate without lower urinary tract symptoms: Secondary | ICD-10-CM | POA: Insufficient documentation

## 2020-01-13 DIAGNOSIS — G629 Polyneuropathy, unspecified: Secondary | ICD-10-CM | POA: Diagnosis not present

## 2020-01-13 DIAGNOSIS — N1832 Chronic kidney disease, stage 3b: Secondary | ICD-10-CM | POA: Diagnosis not present

## 2020-01-13 DIAGNOSIS — J449 Chronic obstructive pulmonary disease, unspecified: Secondary | ICD-10-CM | POA: Diagnosis not present

## 2020-01-13 NOTE — Assessment & Plan Note (Signed)
BP Readings from Last 3 Encounters:  01/13/20 116/62  07/30/19 122/64  07/14/19 104/64   Doing fine with the lisinopril/HCTZ

## 2020-01-13 NOTE — Assessment & Plan Note (Signed)
GFR 41 last time On ACEI Will recheck at next visit

## 2020-01-13 NOTE — Progress Notes (Signed)
Subjective:    Patient ID: Leon Estes, male    DOB: [birth year], 86 y.o.   MRN: [id number]  HPI Here for Medicare wellness visit and follow up of chronic health conditions But he apparently had a wellness visit with Dr Lina Sar office  This visit occurred during the SARS-CoV-2 public health emergency.  Safety protocols were in place, including screening questions prior to the visit, additional usage of staff PPE, and extensive cleaning of exam room while observing appropriate contact time as indicated for disinfecting solutions.   He has no new concerns Had blood work in December---GFR 41  No chest pain No dizziness or syncope No sig edema No palpitations  Continues on the symbicort--every morning Inconsistent with second dose No regular cough Breathing is mostly okay  Urinary issues have improved  Continues on gabapentin at night Helps him sleep with neuropathy Chronic tingling etc--seems some better  Current Outpatient Medications on File Prior to Visit  Medication Sig Dispense Refill  . aspirin EC 81 MG tablet Take 81 mg by mouth daily.    . budesonide-formoterol (SYMBICORT) 160-4.5 MCG/ACT inhaler Inhale 2 puffs into the lungs daily.    . cetirizine (ZYRTEC) 10 MG tablet Take 10 mg by mouth daily.    Marland Kitchen gabapentin (NEURONTIN) 300 MG capsule Take 300 mg by mouth at bedtime.    Marland Kitchen ketoconazole (NIZORAL) 2 % cream APPLY 1 APPLICATION TWICE DAILY AS NEEDED FOR IRRITATION 60 g 0  . lisinopril-hydrochlorothiazide (PRINZIDE,ZESTORETIC) 20-25 MG per tablet Take 1 tablet by mouth daily.    . metoprolol succinate (TOPROL-XL) 25 MG 24 hr tablet TAKE 1 TABLET BY MOUTH ONCE DAILY 30 tablet 11  . pantoprazole (PROTONIX) 40 MG tablet     . simvastatin (ZOCOR) 40 MG tablet Take 40 mg by mouth every evening.    . tamsulosin (FLOMAX) 0.4 MG CAPS capsule Take 0.4 mg by mouth daily.    . traMADol (ULTRAM) 50 MG tablet 50 mg 2 (two) times daily as needed.     . NONFORMULARY OR COMPOUNDED  ITEM See pharmacy note 120 each 2   No current facility-administered medications on file prior to visit.    Allergies  Allergen Reactions  . Sulfa Antibiotics Rash  . Neosporin [Neomycin-Bacitracin Zn-Polymyx]   . Augmentin [Amoxicillin-Pot Clavulanate] Rash  . Terbinafine And Related Rash    Past Medical History:  Diagnosis Date  . Asthma   . BPH (benign prostatic hypertrophy)   . Cataract   . Chronic renal disease, stage III   . Chronic sinusitis   . COPD (chronic obstructive pulmonary disease) ([place])   . Coronary artery disease    2 stents  . Gall stones   . Hearing loss    DOES NOT WEAR HEARING AIDS  . High cholesterol   . Hypertension   . Macular degeneration    right eye    Past Surgical History:  Procedure Laterality Date  . APPENDECTOMY    . CORONARY ANGIOPLASTY WITH STENT PLACEMENT  1998   2 stents  . TRANSURETHRAL RESECTION OF PROSTATE      Family History  Problem Relation Age of Onset  . Diabetes Father   . Heart disease Brother     Social History   Socioeconomic History  . Marital status: Married    Spouse name: Not on file  . Number of children: 4  . Years of education: Not on file  . Highest education level: Not on file  Occupational History  . Occupation: Drove  truck and concrete work  Tobacco Use  . Smoking status: Former Smoker    Types: Cigarettes    Quit date: 08/13/2005    Years since quitting: 14.4  . Smokeless tobacco: Current User    Types: Chew  . Tobacco comment: discussed stopping this (only once in a while)  Substance and Sexual Activity  . Alcohol use: No  . Drug use: No  . Sexual activity: Not on file  Other Topics Concern  . Not on file  Social History Narrative   No living will   Wife, then daughter should make decisions   Would accept resuscitation   Would probably accept tube feeds   Social Determinants of Health   Financial Resource Strain:   . Difficulty of Paying Living Expenses:   Food Insecurity:   .  Worried About Charity fundraiser in the Last Year:   . Arboriculturist in the Last Year:   Transportation Needs:   . Film/video editor (Medical):   Marland Kitchen Lack of Transportation (Non-Medical):   Physical Activity:   . Days of Exercise per Week:   . Minutes of Exercise per Session:   Stress:   . Feeling of Stress :   Social Connections:   . Frequency of Communication with Friends and Family:   . Frequency of Social Gatherings with Friends and Family:   . Attends Religious Services:   . Active Member of Clubs or Organizations:   . Attends Archivist Meetings:   Marland Kitchen Marital Status:   Intimate Partner Violence:   . Fear of Current or Ex-Partner:   . Emotionally Abused:   Marland Kitchen Physically Abused:   . Sexually Abused:    Review of Systems Appetite is fine Weight is stable Occasional headache--tylenol helps Sleeps well Still does all his yard work    Objective:   Physical Exam  Constitutional: He appears well-developed. No distress.  Neck: No thyromegaly present.  Cardiovascular: Normal rate, regular rhythm, normal heart sounds and intact distal pulses. Exam reveals no gallop.  No murmur heard. Respiratory: Effort normal. No respiratory distress. He has no wheezes. He has no rales.  Decreased breath sounds but clear  Musculoskeletal:        General: No tenderness or edema.  Lymphadenopathy:    He has no cervical adenopathy.  Psychiatric: He has a normal mood and affect. His behavior is normal.           Assessment & Plan:

## 2020-01-13 NOTE — Assessment & Plan Note (Signed)
Mild symptoms symbicort seems to control No changes needed

## 2020-01-13 NOTE — Progress Notes (Signed)
Hearing Screening   125Hz  250Hz  500Hz  1000Hz  2000Hz  3000Hz  4000Hz  6000Hz  8000Hz   Right ear:           Left ear:           Comments: Has hearing aids. Not wearing them today.  Vision Screening Comments: Dr Zigmund Daniel states there is nothing to do for his eyes.

## 2020-01-13 NOTE — Assessment & Plan Note (Signed)
Doing okay on tamsulosin

## 2020-01-13 NOTE — Assessment & Plan Note (Signed)
Ongoing tingling Does okay with the night gabapentin Wife will wrap big toes which are most affected

## 2020-02-02 ENCOUNTER — Ambulatory Visit (INDEPENDENT_AMBULATORY_CARE_PROVIDER_SITE_OTHER): Payer: PPO | Admitting: Family Medicine

## 2020-02-02 ENCOUNTER — Encounter: Payer: Self-pay | Admitting: Family Medicine

## 2020-02-02 ENCOUNTER — Other Ambulatory Visit: Payer: Self-pay

## 2020-02-02 VITALS — BP 130/60 | HR 76 | Temp 97.6°F | Ht 66.5 in | Wt 168.6 lb

## 2020-02-02 DIAGNOSIS — M205X2 Other deformities of toe(s) (acquired), left foot: Secondary | ICD-10-CM

## 2020-02-02 DIAGNOSIS — G629 Polyneuropathy, unspecified: Secondary | ICD-10-CM

## 2020-02-02 DIAGNOSIS — R21 Rash and other nonspecific skin eruption: Secondary | ICD-10-CM | POA: Diagnosis not present

## 2020-02-02 DIAGNOSIS — R739 Hyperglycemia, unspecified: Secondary | ICD-10-CM | POA: Diagnosis not present

## 2020-02-02 LAB — CBC WITH DIFFERENTIAL/PLATELET
Basophils Absolute: 0.1 10*3/uL (ref 0.0–0.1)
Basophils Relative: 1 % (ref 0.0–3.0)
Eosinophils Absolute: 0.5 10*3/uL (ref 0.0–0.7)
Eosinophils Relative: 6.9 % — ABNORMAL HIGH (ref 0.0–5.0)
HCT: 35.6 % — ABNORMAL LOW (ref 39.0–52.0)
Hemoglobin: 11.5 g/dL — ABNORMAL LOW (ref 13.0–17.0)
Lymphocytes Relative: 26.3 % (ref 12.0–46.0)
Lymphs Abs: 2 10*3/uL (ref 0.7–4.0)
MCHC: 32.5 g/dL (ref 30.0–36.0)
MCV: 93.6 fl (ref 78.0–100.0)
Monocytes Absolute: 1 10*3/uL (ref 0.1–1.0)
Monocytes Relative: 12.6 % — ABNORMAL HIGH (ref 3.0–12.0)
Neutro Abs: 4.1 10*3/uL (ref 1.4–7.7)
Neutrophils Relative %: 53.2 % (ref 43.0–77.0)
Platelets: 228 10*3/uL (ref 150.0–400.0)
RBC: 3.8 Mil/uL — ABNORMAL LOW (ref 4.22–5.81)
RDW: 14.8 % (ref 11.5–15.5)
WBC: 7.7 10*3/uL (ref 4.0–10.5)

## 2020-02-02 LAB — BASIC METABOLIC PANEL
BUN: 22 mg/dL (ref 6–23)
CO2: 30 mEq/L (ref 19–32)
Calcium: 9.2 mg/dL (ref 8.4–10.5)
Chloride: 103 mEq/L (ref 96–112)
Creatinine, Ser: 1.51 mg/dL — ABNORMAL HIGH (ref 0.40–1.50)
GFR: 43.94 mL/min — ABNORMAL LOW (ref >60.00)
Glucose, Bld: 91 mg/dL (ref 70–99)
Potassium: 4.3 mEq/L (ref 3.5–5.1)
Sodium: 142 mEq/L (ref 135–145)

## 2020-02-02 LAB — VITAMIN B12: Vitamin B-12: 343 pg/mL (ref 211–911)

## 2020-02-02 LAB — SEDIMENTATION RATE: Sed Rate: 28 mm/hr — ABNORMAL HIGH (ref 0–20)

## 2020-02-02 LAB — HEMOGLOBIN A1C: Hgb A1c MFr Bld: 6.9 % — ABNORMAL HIGH (ref 4.6–6.5)

## 2020-02-02 MED ORDER — CEPHALEXIN 500 MG PO CAPS: 500.0000 mg | ORAL_CAPSULE | Freq: Three times a day (TID) | ORAL | 0 refills | Status: DC

## 2020-02-02 NOTE — Progress Notes (Signed)
This visit was conducted in person.  BP 130/60 (BP Location: Left Arm, Patient Position: Sitting, Cuff Size: Normal)   Pulse 76   Temp 97.6 F (36.4 C) (Temporal)   Ht 5' 6.5" (1.689 m)   Wt 168 lb 9 oz (76.5 kg)   SpO2 95%   BMI 26.80 kg/m    CC: L>R foot/toe swelling Subjective:    Patient ID: Leon Estes, male    DOB: 1930-10-04, 86 y.o.   MRN: [id number]  HPI: Leon Estes is a 86 y.o. male presenting on 02/02/2020 for Foot Swelling (C/o bilateral feet swelling.  Swelling is worse in left great toe and middle toe and has redness.  Started 01/31/20.  Has seen podiatry and dermatology. Pt accompanined by wife, Leon Estes- temp 97.8.)   2d h/o L foot swelling at L great toe and middle toe. He describes neuropathic pain at toes - stinging and burning. Wife wraps up toes with tape at night to help him sleep - doesn't know what tape they are using. Feet feel numb. No pain, no itching. They have been soaking foot in epsom salt bath.   No fevers/chills, nausea.   No new medicines, supplements.  He continues aspirin daily as well as gabapentin 384m at night time.   He previously saw podiatry (06/2019) for similar symptoms, treated with methylprednisolone taper and neuropathy cream with benefit.   Deny h/o diabetes Lab Results  Component Value Date   HGBA1C 6.9 (H) 07/14/2019       Relevant past medical, surgical, family and social history reviewed and updated as indicated. Interim medical history since our last visit reviewed. Allergies and medications reviewed and updated. Outpatient Medications Prior to Visit  Medication Sig Dispense Refill  . aspirin EC 81 MG tablet Take 81 mg by mouth daily.    . budesonide-formoterol (SYMBICORT) 160-4.5 MCG/ACT inhaler Inhale 2 puffs into the lungs daily.    . cetirizine (ZYRTEC) 10 MG tablet Take 10 mg by mouth daily.    .Marland Kitchengabapentin (NEURONTIN) 300 MG capsule Take 300 mg by mouth at bedtime.    .Marland Kitchenketoconazole (NIZORAL) 2 % cream  APPLY 1 APPLICATION TWICE DAILY AS NEEDED FOR IRRITATION 60 g 0  . lisinopril-hydrochlorothiazide (PRINZIDE,ZESTORETIC) 20-25 MG per tablet Take 1 tablet by mouth daily.    . metoprolol succinate (TOPROL-XL) 25 MG 24 hr tablet TAKE 1 TABLET BY MOUTH ONCE DAILY 30 tablet 11  . NONFORMULARY OR COMPOUNDED ITEM See pharmacy note 120 each 2  . pantoprazole (PROTONIX) 40 MG tablet     . simvastatin (ZOCOR) 40 MG tablet Take 40 mg by mouth every evening.    . tamsulosin (FLOMAX) 0.4 MG CAPS capsule Take 0.4 mg by mouth daily.    . traMADol (ULTRAM) 50 MG tablet 50 mg 2 (two) times daily as needed.      No facility-administered medications prior to visit.     Per HPI unless specifically indicated in ROS section below Review of Systems Objective:  BP 130/60 (BP Location: Left Arm, Patient Position: Sitting, Cuff Size: Normal)   Pulse 76   Temp 97.6 F (36.4 C) (Temporal)   Ht 5' 6.5" (1.689 m)   Wt 168 lb 9 oz (76.5 kg)   SpO2 95%   BMI 26.80 kg/m   Wt Readings from Last 3 Encounters:  02/02/20 168 lb 9 oz (76.5 kg)  01/13/20 167 lb (75.8 kg)  07/30/19 162 lb (73.5 kg)      Physical Exam Vitals and  nursing note reviewed.  Constitutional:      Appearance: Normal appearance.  HENT:     Ears:     Comments: HOH Musculoskeletal:        General: No tenderness. Normal range of motion.     Right lower leg: No edema.     Left lower leg: No edema.     Comments: 2+ DP bilaterally  Skin:    General: Skin is warm and dry.     Findings: Erythema and rash present.     Comments:  Erythematous rash to L>R distal dorsal foot just proximal to toes, no warmth, + drainage of clear fluid.  Maceration of 1st and 2nd toes at interdigital space  Claw deformity to L 2nd toe   Neurological:     Mental Status: He is alert.  Psychiatric:        Mood and Affect: Mood normal.        Behavior: Behavior normal.       Assessment & Plan:  This visit occurred during the SARS-CoV-2 public health  emergency.  Safety protocols were in place, including screening questions prior to the visit, additional usage of staff PPE, and extensive cleaning of exam room while observing appropriate contact time as indicated for disinfecting solutions.   Problem List Items Addressed This Visit    Rash of foot - Primary    L>R erythematous rash with skin maceration between toes - looks more irritative than infectious but with open skin and h/o elevated sugars, could be bacterial vs fungal infection. Check CBC, ESR today.  Recommend cover with keflex 578m TID x 5 days, as well as clotrimazole cream to skin.  I think regular taping of toes at night time by wife may have led to skin maceration and subsequent irritant dermatitis - advised they stop this practice, instead wrap with gauze at night. They agree.  With claw deformity of 2nd great toe, will refer back to podiatry to follow along with [facility]  [facility] if not improving with treatment.       Relevant Orders   CBC with Differential/Platelet   Basic metabolic panel   Sedimentation rate   Vitamin B12   Ambulatory referral to Podiatry   Neuropathy    Ongoing, on gabapentin. Deny h/o diabetes but last A1c 6.9%. update today, along with B12 level.       Relevant Orders   CBC with Differential/Platelet   Basic metabolic panel   Sedimentation rate   Vitamin B12   Ambulatory referral to Podiatry   Acquired claw toe, left   Relevant Orders   Ambulatory referral to Podiatry    Other Visit Diagnoses    Hyperglycemia       Relevant Orders   Hemoglobin A1c       Meds ordered this encounter  Medications  . cephALEXin (KEFLEX) 500 MG capsule    Sig: Take 1 capsule (500 mg total) by mouth 3 (three) times daily.    Dispense:  15 capsule    Refill:  0   Orders Placed This Encounter  Procedures  . CBC with Differential/Platelet  . Basic metabolic panel  . Sedimentation rate  . Hemoglobin A1c  . Vitamin B12  . Ambulatory referral to Podiatry     Referral Priority:   Routine    Referral Type:   Consultation    Referral Reason:   Specialty Services Required    Requested Specialty:   Podiatry    Number of Visits Requested:   1  Patient Instructions  This could be infection of skin of foot - either bacterial or fungal - or could be bad irritation of skin. Stop tape at night time.  Treat with antibiotic sent to pharmacy. Also use lotrimin (over the counter antifungal cream) to skin two to three times a day. Keep area clean and dry.  I want you to return to see podiatry Dr Posey Pronto.  Labs today for further evaluation.    Follow up plan: No follow-ups on file.  Ria Bush, MD

## 2020-02-02 NOTE — Assessment & Plan Note (Signed)
L>R erythematous rash with skin maceration between toes - looks more irritative than infectious but with open skin and h/o elevated sugars, could be bacterial vs fungal infection. Check CBC, ESR today.  Recommend cover with keflex 534m TID x 5 days, as well as clotrimazole cream to skin.  I think regular taping of toes at night time by wife may have led to skin maceration and subsequent irritant dermatitis - advised they stop this practice, instead wrap with gauze at night. They agree.  With claw deformity of 2nd great toe, will refer back to podiatry to follow along with [place]  [facility] if not improving with treatment.

## 2020-02-02 NOTE — Patient Instructions (Addendum)
This could be infection of skin of foot - either bacterial or fungal - or could be bad irritation of skin. Stop tape at night time.  Treat with antibiotic sent to pharmacy. Also use lotrimin (over the counter antifungal cream) to skin two to three times a day. Keep area clean and dry.  I want you to return to see podiatry Dr Posey Pronto.  Labs today for further evaluation.

## 2020-02-02 NOTE — Assessment & Plan Note (Addendum)
Ongoing, on gabapentin. Deny h/o diabetes but last A1c 6.9%. update today, along with B12 level.

## 2020-02-11 ENCOUNTER — Other Ambulatory Visit: Payer: Self-pay

## 2020-02-11 ENCOUNTER — Encounter: Payer: Self-pay | Admitting: Internal Medicine

## 2020-02-11 ENCOUNTER — Ambulatory Visit (INDEPENDENT_AMBULATORY_CARE_PROVIDER_SITE_OTHER): Payer: PPO | Admitting: Internal Medicine

## 2020-02-11 DIAGNOSIS — R21 Rash and other nonspecific skin eruption: Secondary | ICD-10-CM | POA: Diagnosis not present

## 2020-02-11 MED ORDER — KETOCONAZOLE 2 % EX CREA: TOPICAL_CREAM | CUTANEOUS | 0 refills | Status: DC

## 2020-02-11 MED ORDER — FLUCONAZOLE 100 MG PO TABS: 100.0000 mg | ORAL_TABLET | Freq: Every day | ORAL | 0 refills | Status: DC

## 2020-02-11 NOTE — Patient Instructions (Signed)
If your feet are not much better in the next 2 weeks or so, it will be time to see the podiatrist.

## 2020-02-11 NOTE — Progress Notes (Signed)
Subjective:    Patient ID: Leon Estes, male    DOB: [birth year], 86 y.o.   MRN: [id number]  HPI Here due to ongoing problems with  his feet This visit occurred during the SARS-CoV-2 public health emergency.  Safety protocols were in place, including screening questions prior to the visit, additional usage of staff PPE, and extensive cleaning of exam room while observing appropriate contact time as indicated for disinfecting solutions.   Seen last week for problems with feet Finished the cephalexin Using cream on his feet as well Helped some---but ongoing numbness and tingling Has an area that opened under his right great toe  Brief swelling but not now No fever  Current Outpatient Medications on File Prior to Visit  Medication Sig Dispense Refill  . aspirin EC 81 MG tablet Take 81 mg by mouth daily.    . budesonide-formoterol (SYMBICORT) 160-4.5 MCG/ACT inhaler Inhale 2 puffs into the lungs daily.    . cetirizine (ZYRTEC) 10 MG tablet Take 10 mg by mouth daily.    Marland Kitchen gabapentin (NEURONTIN) 300 MG capsule Take 300 mg by mouth at bedtime.    Marland Kitchen ketoconazole (NIZORAL) 2 % cream APPLY 1 APPLICATION TWICE DAILY AS NEEDED FOR IRRITATION 60 g 0  . lisinopril-hydrochlorothiazide (PRINZIDE,ZESTORETIC) 20-25 MG per tablet Take 1 tablet by mouth daily.    . metoprolol succinate (TOPROL-XL) 25 MG 24 hr tablet TAKE 1 TABLET BY MOUTH ONCE DAILY 30 tablet 11  . NONFORMULARY OR COMPOUNDED ITEM See pharmacy note 120 each 2  . pantoprazole (PROTONIX) 40 MG tablet     . simvastatin (ZOCOR) 40 MG tablet Take 40 mg by mouth every evening.    . tamsulosin (FLOMAX) 0.4 MG CAPS capsule Take 0.4 mg by mouth daily.    . traMADol (ULTRAM) 50 MG tablet 50 mg 2 (two) times daily as needed.      No current facility-administered medications on file prior to visit.    Allergies  Allergen Reactions  . Sulfa Antibiotics Rash  . Neosporin [Neomycin-Bacitracin Zn-Polymyx]   . Augmentin [Amoxicillin-Pot  Clavulanate] Rash  . Terbinafine And Related Rash    Past Medical History:  Diagnosis Date  . Asthma   . BPH (benign prostatic hypertrophy)   . Cataract   . Chronic renal disease, stage III   . Chronic sinusitis   . COPD (chronic obstructive pulmonary disease) ([place])   . Coronary artery disease    2 stents  . Gall stones   . Hearing loss    DOES NOT WEAR HEARING AIDS  . High cholesterol   . Hypertension   . Macular degeneration    right eye    Past Surgical History:  Procedure Laterality Date  . APPENDECTOMY    . CORONARY ANGIOPLASTY WITH STENT PLACEMENT  1998   2 stents  . TRANSURETHRAL RESECTION OF PROSTATE      Family History  Problem Relation Age of Onset  . Diabetes Father   . Heart disease Brother     Social History   Socioeconomic History  . Marital status: Married    Spouse name: Not on file  . Number of children: 4  . Years of education: Not on file  . Highest education level: Not on file  Occupational History  . Occupation: Drove truck and concrete work  Tobacco Use  . Smoking status: Former Smoker    Types: Cigarettes    Quit date: 08/13/2005    Years since quitting: 14.5  . Smokeless tobacco: Current  User    Types: Chew  . Tobacco comment: discussed stopping this (only once in a while)  Vaping Use  . Vaping Use: Never used  Substance and Sexual Activity  . Alcohol use: No  . Drug use: No  . Sexual activity: Not on file  Other Topics Concern  . Not on file  Social History Narrative   No living will   Wife, then daughter should make decisions   Would accept resuscitation   Would probably accept tube feeds   Social Determinants of Health   Financial Resource Strain:   . Difficulty of Paying Living Expenses:   Food Insecurity:   . Worried About Charity fundraiser in the Last Year:   . Arboriculturist in the Last Year:   Transportation Needs:   . Film/video editor (Medical):   Marland Kitchen Lack of Transportation (Non-Medical):   Physical  Activity:   . Days of Exercise per Week:   . Minutes of Exercise per Session:   Stress:   . Feeling of Stress :   Social Connections:   . Frequency of Communication with Friends and Family:   . Frequency of Social Gatherings with Friends and Family:   . Attends Religious Services:   . Active Member of Clubs or Organizations:   . Attends Archivist Meetings:   Marland Kitchen Marital Status:   Intimate Partner Violence:   . Fear of Current or Ex-Partner:   . Emotionally Abused:   Marland Kitchen Physically Abused:   . Sexually Abused:    Review of Systems No new shoes No new medications, supplements, etc    Objective:   Physical Exam Skin:    Comments: Slight maceration on top of left toes (1-3) Hypertrophic brown areas on bottom of great, 2nd and 3rd toes bilaterally No maceration between toes            Assessment & Plan:

## 2020-02-11 NOTE — Assessment & Plan Note (Signed)
Better with Rx but now with hypertrophic brown spots on base of some toes May be sequelae of athlete's foot--which seems to have been mostly treated Some neuropathy based on his symptoms--but labs didn't show cause  Will try 1 week of fluconazole Continue topical Rx

## 2020-03-02 ENCOUNTER — Other Ambulatory Visit: Payer: Self-pay | Admitting: Internal Medicine

## 2020-03-08 ENCOUNTER — Ambulatory Visit: Payer: PPO | Admitting: Podiatry

## 2020-04-26 ENCOUNTER — Encounter: Payer: Self-pay | Admitting: Cardiovascular Disease

## 2020-04-26 ENCOUNTER — Ambulatory Visit: Payer: PPO | Admitting: Cardiovascular Disease

## 2020-04-26 ENCOUNTER — Other Ambulatory Visit: Payer: Self-pay

## 2020-04-26 VITALS — BP 116/52 | HR 84 | Ht 70.0 in | Wt 165.8 lb

## 2020-04-26 DIAGNOSIS — I10 Essential (primary) hypertension: Secondary | ICD-10-CM

## 2020-04-26 DIAGNOSIS — I2510 Atherosclerotic heart disease of native coronary artery without angina pectoris: Secondary | ICD-10-CM | POA: Diagnosis not present

## 2020-04-26 DIAGNOSIS — E782 Mixed hyperlipidemia: Secondary | ICD-10-CM | POA: Diagnosis not present

## 2020-04-26 NOTE — Progress Notes (Signed)
Cardiology Office Note   Date:  04/26/2020   ID:  Leon Estes, DOB 1930/12/17, MRN [id number]  PCP:  Venia Carbon, MD  Cardiologist:   Mertie Moores, MD / previously saw [name], MD at [facility] Complaint  Patient presents with  . Coronary Artery Disease   Problem list: 1. Coronary artery disease-status post stenting of the right coronary artery in 1997 2. COPD 3. Hyperlipidemia     Klyde DALLAS TOROK is a 86 y.o. male who presents for follow up of his CP He had am MI in 1997.  Had 2 stents placed by Dr. Glade Lloyd. Hs seen Skains in the past, wants to see a cardiologist here in [place] .  Has done well,  No further of CP.  Exercises regularly,  Mows 3 acres.   Goes bowling once a week.  Also has some pain down his leg.  Able to weed-eat without any problems  Sees Dr. Delfina Redwood. Was recently found to have mild anemia.  Was started on iron at that time .   January 13, 2016:  Doing well.  Still very active.  Mows his 3 acres and weed-eats .   Bowls on wednesdays   Aug. 28, 2018: Doing well,  No CP or dyspnea.  Still bowling - now bowls around 180 .  Sees Dr. Delfina Redwood  - manages his lipids   April 15, 2019: [name] is seen today for follow-up visit. Doing well.   Has not been bowling - closed for COVID No CP or dyspnea  Has some dyspnea due to his COPD .  - takes an inhaler regularly  Has had some leg pain .  Has resolve   April 26, 2020: [name] is seen back today for follow-up of his coronary artery disease.  He has some shortness of breath due to his COPD. Has generalized fatiuged  No angina    Past Medical History:  Diagnosis Date  . Asthma   . BPH (benign prostatic hypertrophy)   . Cataract   . Chronic renal disease, stage III   . Chronic sinusitis   . COPD (chronic obstructive pulmonary disease) ([place])   . Coronary artery disease    2 stents  . Gall stones   . Hearing loss    DOES NOT WEAR HEARING AIDS  . High cholesterol   . Hypertension    . Macular degeneration    right eye    Past Surgical History:  Procedure Laterality Date  . APPENDECTOMY    . CORONARY ANGIOPLASTY WITH STENT PLACEMENT  1998   2 stents  . TRANSURETHRAL RESECTION OF PROSTATE       Current Outpatient Medications  Medication Sig Dispense Refill  . aspirin EC 81 MG tablet Take 81 mg by mouth daily.    . budesonide-formoterol (SYMBICORT) 160-4.5 MCG/ACT inhaler Inhale 2 puffs into the lungs daily.    . cetirizine (ZYRTEC) 10 MG tablet Take 10 mg by mouth daily.    . fluconazole (DIFLUCAN) 100 MG tablet Take 1 tablet (100 mg total) by mouth daily. 7 tablet 0  . gabapentin (NEURONTIN) 300 MG capsule Take 300 mg by mouth at bedtime.    Marland Kitchen ketoconazole (NIZORAL) 2 % cream APPLY 1 APPLICATION TWICE DAILY AS NEEDED FOR IRRITATION 60 g 0  . lisinopril-hydrochlorothiazide (PRINZIDE,ZESTORETIC) 20-25 MG per tablet Take 1 tablet by mouth daily.    . metoprolol succinate (TOPROL-XL) 25 MG 24 hr tablet TAKE 1 TABLET BY MOUTH ONCE DAILY 30 tablet 11  .  NONFORMULARY OR COMPOUNDED ITEM See pharmacy note 120 each 2  . pantoprazole (PROTONIX) 40 MG tablet     . simvastatin (ZOCOR) 40 MG tablet Take 40 mg by mouth every evening.    . tamsulosin (FLOMAX) 0.4 MG CAPS capsule Take 0.4 mg by mouth daily.    . traMADol (ULTRAM) 50 MG tablet 50 mg 2 (two) times daily as needed.      No current facility-administered medications for this visit.    Allergies:   Sulfa antibiotics, Neosporin [neomycin-bacitracin zn-polymyx], Augmentin [amoxicillin-pot clavulanate], and Terbinafine and related    Social History:  The patient  reports that he quit smoking about 14 years ago. His smoking use included cigarettes. His smokeless tobacco use includes chew. He reports that he does not drink alcohol and does not use drugs.   Family History:  The patient's family history includes Diabetes in his father; Heart disease in his brother.    ROS:  Please see the history of present illness.   Noted in HPI.  Otherwise review of systems is negative.   Physical Exam: Blood pressure (!) 116/52, pulse 84, height 5\' 10"  (1.778 m), weight 165 lb 12.8 oz (75.2 kg), SpO2 94 %.  GEN:  Well nourished, well developed in no acute distress HEENT: Normal NECK: No JVD; No carotid bruits LYMPHATICS: No lymphadenopathy CARDIAC: RRR ,   RESPIRATORY:  Reduced breath sounds   ABDOMEN: Soft, non-tender, non-distended MUSCULOSKELETAL:  No edema; No deformity  SKIN: Warm and dry NEUROLOGIC:  Alert and oriented x 3   EKG:    April 26, 2020: Normal sinus rhythm at 84.  Right bundle branch block.  Occasional premature ventricular contractions.  No changes from previous EKG.   Recent Labs: 07/14/2019: ALT 18 02/02/2020: BUN 22; Creatinine, Ser 1.51; Hemoglobin 11.5; Platelets 228.0; Potassium 4.3; Sodium 142    Lipid Panel No results found for: CHOL, TRIG, HDL, CHOLHDL, VLDL, LDLCALC, LDLDIRECT    Wt Readings from Last 3 Encounters:  04/26/20 165 lb 12.8 oz (75.2 kg)  02/11/20 170 lb 4 oz (77.2 kg)  02/02/20 168 lb 9 oz (76.5 kg)      Other studies Reviewed: Additional studies/ records that were reviewed today include: . Review of the above records demonstrates:    ASSESSMENT AND PLAN:  1. Coronary artery disease-status post stenting of the right coronary artery in 1997  no angina .   Cont meds.       2. COPD -  On inhalers  Is generally fatigued.   3. Hyperlipidemia -  Cont. Current meds.   .  Check lipids, liver, bmp today    Current medicines are reviewed at length with the patient today.  The patient does not have concerns regarding medicines.  The following changes have been made:  no change  Labs/ tests ordered today include:  No orders of the defined types were placed in this encounter.   Disposition:   FU with me in 1 year  With an APP    Mertie Moores, MD  04/26/2020 11:53 AM    [address], [place], [place]   [address] Phone: [phone]; Fax: [phone]

## 2020-04-26 NOTE — Patient Instructions (Addendum)
Centrum Silver  multi vitamins. Available at any drug store   Medication Instructions:  Your physician recommends that you continue on your current medications as directed. Please refer to the Current Medication list given to you today.  *If you need a refill on your cardiac medications before your next appointment, please call your pharmacy*   Lab Work: TODAY - cholesterol, liver panel, basic metabolic panel If you have labs (blood work) drawn today and your tests are completely normal, you will receive your results only by: Marland Kitchen MyChart Message (if you have MyChart) OR . A paper copy in the mail If you have any lab test that is abnormal or we need to change your treatment, we will call you to review the results.   Testing/Procedures: None Ordered   Follow-Up: At [facility], you and your health needs are our priority.  As part of our continuing mission to provide you with exceptional heart care, we have created designated Provider Care Teams.  These Care Teams include your primary Cardiologist (physician) and Advanced Practice Providers (APPs -  Physician Assistants and Nurse Practitioners) who all work together to provide you with the care you need, when you need it.  We recommend signing up for the patient portal called "MyChart".  Sign up information is provided on this After Visit Summary.  MyChart is used to connect with patients for Virtual Visits (Telemedicine).  Patients are able to view lab/test results, encounter notes, upcoming appointments, etc.  Non-urgent messages can be sent to your provider as well.   To learn more about what you can do with MyChart, go to [email or web address].    Your next appointment:   1 year(s)  The format for your next appointment:   In Person  Provider:   Richardson Dopp, PA-C or Robbie Lis, PA-C

## 2020-05-05 NOTE — Addendum Note (Signed)
Addended by: Georgiann Cocker on: 05/05/2020 11:24 AM   Modules accepted: Orders

## 2020-06-03 ENCOUNTER — Ambulatory Visit (INDEPENDENT_AMBULATORY_CARE_PROVIDER_SITE_OTHER): Payer: PPO

## 2020-06-03 ENCOUNTER — Other Ambulatory Visit: Payer: Self-pay

## 2020-06-03 ENCOUNTER — Ambulatory Visit: Payer: PPO | Admitting: Podiatry

## 2020-06-03 ENCOUNTER — Ambulatory Visit: Payer: PPO

## 2020-06-03 DIAGNOSIS — L301 Dyshidrosis [pompholyx]: Secondary | ICD-10-CM

## 2020-06-03 DIAGNOSIS — L309 Dermatitis, unspecified: Secondary | ICD-10-CM | POA: Diagnosis not present

## 2020-06-03 MED ORDER — DOXYCYCLINE HYCLATE 100 MG PO TABS: 100.0000 mg | ORAL_TABLET | Freq: Two times a day (BID) | ORAL | 0 refills | Status: DC

## 2020-06-03 MED ORDER — BETAMETHASONE DIPROPIONATE 0.05 % EX CREA: TOPICAL_CREAM | Freq: Two times a day (BID) | CUTANEOUS | 1 refills | Status: DC

## 2020-06-03 MED ORDER — GENTAMICIN SULFATE 0.1 % EX CREA: 1.0000 "application " | TOPICAL_CREAM | Freq: Two times a day (BID) | CUTANEOUS | 1 refills | Status: DC

## 2020-06-03 NOTE — Progress Notes (Signed)
   HPI: 86 y.o. male presenting today for an evaluation of an acute flareup that is occurred to the patient's bilateral feet. Left greater than the right. Patient states that he has had this happen before. They have been to different physicians including dermatologist which have not provided any permanent or lasting treatment options. They present for further treatment evaluation  Past Medical History:  Diagnosis Date  . Asthma   . BPH (benign prostatic hypertrophy)   . Cataract   . Chronic renal disease, stage III   . Chronic sinusitis   . COPD (chronic obstructive pulmonary disease) ([place])   . Coronary artery disease    2 stents  . Gall stones   . Hearing loss    DOES NOT WEAR HEARING AIDS  . High cholesterol   . Hypertension   . Macular degeneration    right eye         Physical Exam: General: The patient is alert and oriented x3 in no acute distress.  Dermatology: Skin is warm, dry and supple bilateral lower extremities. Maceration noted to the first interdigital webspace of the left foot. There is some superficial skin breakdown noted as well. Associated erythema. Upon closer evaluation there is some pinpoint vesicular lesions throughout the affected areas of the feet bilaterally. The vesicular lesions to the left foot do not appear to have the skin breakdown and acute inflammatory presentation as compared to the left foot  Vascular: Palpable pedal pulses bilaterally. Capillary refill within normal limits.  Neurological: Epicritic and protective threshold grossly intact bilaterally.   Musculoskeletal Exam: Range of motion within normal limits to all pedal and ankle joints bilateral. Muscle strength 5/5 in all groups bilateral.   Assessment: 1. Possible acute vesicular eczema of the foot 2. Dermatitis left foot with superficial skin breakdown   Plan of Care:  1. Patient evaluated.  2. Prescription for doxycycline 100 mg 2 times daily. Although I do not suspect this is  a bacterial infection I am prescribing this more for prophylaxis to ensure that the superficial lesions do not become cellulitic. 3. 2 topical prescriptions were provided today. Gentamicin cream and betamethasone 0.05% cream. Instructions to combine the 2 in a 50-50 mixture and apply 2 times daily. The patient and spouse both understand the instructions 4. Return to clinic in 3 weeks      Edrick Kins, DPM [facility]  Dr. Edrick Kins, DPM    [address], [place] [address]                Office [phone]  Fax [phone]

## 2020-06-24 ENCOUNTER — Ambulatory Visit: Payer: PPO | Admitting: Podiatry

## 2020-06-24 ENCOUNTER — Encounter: Payer: Self-pay | Admitting: Podiatry

## 2020-06-24 ENCOUNTER — Other Ambulatory Visit: Payer: Self-pay

## 2020-06-24 DIAGNOSIS — L309 Dermatitis, unspecified: Secondary | ICD-10-CM

## 2020-06-24 DIAGNOSIS — L301 Dyshidrosis [pompholyx]: Secondary | ICD-10-CM | POA: Diagnosis not present

## 2020-06-24 NOTE — Progress Notes (Signed)
   HPI: 86 y.o. male presenting today for follow-up evaluation of a vesicular dermatitis to the bilateral feet.  Patient took the oral antibiotics and has been applying the combination gentamicin cream and betamethasone cream in a 50-50 mixture 2 times daily.  He states that there is significant improvement.  No new complaints at this time  Past Medical History:  Diagnosis Date  . Asthma   . BPH (benign prostatic hypertrophy)   . Cataract   . Chronic renal disease, stage III ([place])   . Chronic sinusitis   . COPD (chronic obstructive pulmonary disease) ([place])   . Coronary artery disease    2 stents  . Gall stones   . Hearing loss    DOES NOT WEAR HEARING AIDS  . High cholesterol   . Hypertension   . Macular degeneration    right eye    Physical Exam: General: The patient is alert and oriented x3 in no acute distress.  Dermatology: Skin is warm, dry and supple bilateral lower extremities.  No open lesions noted.  No vesicular lesions noted throughout the feet.  The vesicular dermatitis appears to be completely resolved.  There is some peeling skin noted from the healing.  Otherwise the skin is very supple and dry  Vascular: Palpable pedal pulses bilaterally. Capillary refill within normal limits.  Neurological: Epicritic and protective threshold grossly intact bilaterally.   Musculoskeletal Exam: Range of motion within normal limits to all pedal and ankle joints bilateral. Muscle strength 5/5 in all groups bilateral.   Assessment: 1. Possible acute vesicular eczema of the foot-resolved 2. Dermatitis left foot with superficial skin breakdown-resolved   Plan of Care:  1. Patient evaluated.  2.  Continue gentamicin cream and betamethasone 0.05% cream. Instructions to combine the 2 in a 50-50 mixture and apply 2 times daily as needed. The patient and spouse both understand the instructions 4.  Return to clinic as needed      Edrick Kins, DPM [facility]  Dr.  Edrick Kins, DPM    [address], [place] [address]                Office [phone]  Fax [phone]

## 2020-07-11 ENCOUNTER — Other Ambulatory Visit: Payer: Self-pay

## 2020-07-11 ENCOUNTER — Ambulatory Visit (INDEPENDENT_AMBULATORY_CARE_PROVIDER_SITE_OTHER): Payer: PPO | Admitting: Family Medicine

## 2020-07-11 ENCOUNTER — Encounter: Payer: Self-pay | Admitting: Family Medicine

## 2020-07-11 VITALS — BP 110/60 | HR 77 | Temp 99.3°F | Ht 70.0 in | Wt 162.0 lb

## 2020-07-11 DIAGNOSIS — W548XXA Other contact with dog, initial encounter: Secondary | ICD-10-CM

## 2020-07-11 DIAGNOSIS — L03113 Cellulitis of right upper limb: Secondary | ICD-10-CM

## 2020-07-11 DIAGNOSIS — M25531 Pain in right wrist: Secondary | ICD-10-CM

## 2020-07-11 MED ORDER — CEFTRIAXONE SODIUM 1 G IJ SOLR
1.0000 g | Freq: Once | INTRAMUSCULAR | Status: AC
  Administered 2020-07-11: 1 g via INTRAMUSCULAR

## 2020-07-11 MED ORDER — CLINDAMYCIN HCL 300 MG PO CAPS: 300.0000 mg | ORAL_CAPSULE | Freq: Four times a day (QID) | ORAL | 0 refills | Status: DC

## 2020-07-11 NOTE — Progress Notes (Signed)
Leon Nardo T. Nimo Verastegui, MD, [place] at [facility] [place], [address]  Phone: [phone]  FAX: [phone]  [name] - 86 y.o. male  MRN [id number]  Date of Birth: Mar 06, 1931  Date: 07/11/2020  PCP: Venia Carbon, MD  Referral: Venia Carbon, MD  Chief Complaint  Patient presents with  . Joint Swelling    Wrist-Right-Scratchy by dog    This visit occurred during the SARS-CoV-2 public health emergency.  Safety protocols were in place, including screening questions prior to the visit, additional usage of staff PPE, and extensive cleaning of exam room while observing appropriate contact time as indicated for disinfecting solutions.   Subjective:   Leon Estes is a 86 y.o. very pleasant male patient with Body mass index is 23.24 kg/m. who presents with the following:  Patient presents with some right-sided swelling and a significant abrasion after he was scratched by his dog on Saturday night.  This is continued to worsen, now he has some swelling in the hand and arm.  There is also some redness and warmth.  He denies any frank fevers or any systemic symptoms.  He continues to eat and drink well.  He is accompanied by his wife who provides additional history.  Tetanus is up-to-date.  He has not really tried much of anything else thus far.  Immunization History  Administered Date(s) Administered  . Fluad Quad(high Dose 65+) 04/30/2019  . Influenza,inj,Quad PF,6+ Mos 04/30/2018  . Influenza-Unspecified 05/21/2017  . Moderna SARS-COVID-2 Vaccination 12/03/2019, 12/31/2019  . Pneumococcal Conjugate-13 12/02/2014  . Pneumococcal Polysaccharide-23 08/13/2000, 02/18/2018  . Td 08/24/2011  . Zoster 11/15/2014    Saturday night at 10 o'clock - scratched     Immunization History  Administered Date(s) Administered  . Fluad Quad(high Dose 65+) 04/30/2019  .  Influenza,inj,Quad PF,6+ Mos 04/30/2018  . Influenza-Unspecified 05/21/2017  . Moderna SARS-COVID-2 Vaccination 12/03/2019, 12/31/2019  . Pneumococcal Conjugate-13 12/02/2014  . Pneumococcal Polysaccharide-23 08/13/2000, 02/18/2018  . Td 08/24/2011  . Zoster 11/15/2014     Review of Systems is noted in the HPI, as appropriate  Objective:   BP 110/60   Pulse 77   Temp 99.3 F (37.4 C) (Temporal)   Ht 5\' 10"  (1.778 m)   Wt 162 lb (73.5 kg)   SpO2 97%   BMI 23.24 kg/m   GEN: No acute distress; alert,appropriate. PULM: Breathing comfortably in no respiratory distress PSYCH: Normally interactive.   There is redness and warmth, and a line was used to demarcate the line of redness and warmth.        Laboratory and Imaging Data:  Assessment and Plan:     ICD-10-CM   1. Cellulitis of right wrist  L03.113   2. Right wrist pain  M25.531 cefTRIAXone (ROCEPHIN) injection 1 g  3. Dog scratch  W54.8XXA cefTRIAXone (ROCEPHIN) injection 1 g  4. Right forearm cellulitis  L03.113    Concerning infection status post dog scratch with swelling and warmth throughout most of the wrist as well as the distal forearm dorsally.  I have marked this area with permanent marker.  Also reviewed the case and had Dr. Silvio Pate look at the patient as well.  Rocephin 1 g IM now.  Start clindamycin.  On his allergy list, he does note that he has a rash when taking Augmentin.  Given this, clindamycin was chosen.  Follow-up tomorrow with primary care doctor.  Meds ordered this encounter  Medications  . clindamycin (CLEOCIN) 300 MG capsule    Sig: Take 1 capsule (300 mg total) by mouth 4 (four) times daily.    Dispense:  40 capsule    Refill:  0  . cefTRIAXone (ROCEPHIN) injection 1 g    Order Specific Question:   Antibiotic Indication:    Answer:   Cellulitis   Medications Discontinued During This Encounter  Medication Reason  . doxycycline (VIBRA-TABS) 100 MG tablet Completed Course  .  ketoconazole (NIZORAL) 2 % cream Completed Course   No orders of the defined types were placed in this encounter.   Follow-up: Return for Appt with Dr. Silvio Pate tomorrow.  Signed,  [name]. Leon Caspers, MD   Outpatient Encounter Medications as of 07/11/2020  Medication Sig  . aspirin EC 81 MG tablet Take 81 mg by mouth daily.  . betamethasone dipropionate 0.05 % cream Apply topically 2 (two) times daily.  . budesonide-formoterol (SYMBICORT) 160-4.5 MCG/ACT inhaler Inhale 2 puffs into the lungs daily.  . cetirizine (ZYRTEC) 10 MG tablet Take 10 mg by mouth daily.  . fluconazole (DIFLUCAN) 100 MG tablet Take 1 tablet (100 mg total) by mouth daily.  Marland Kitchen gabapentin (NEURONTIN) 300 MG capsule Take 300 mg by mouth at bedtime.  Marland Kitchen gentamicin cream (GARAMYCIN) 0.1 % Apply 1 application topically 2 (two) times daily.  Marland Kitchen lisinopril-hydrochlorothiazide (PRINZIDE,ZESTORETIC) 20-25 MG per tablet Take 1 tablet by mouth daily.  . metoprolol succinate (TOPROL-XL) 25 MG 24 hr tablet TAKE 1 TABLET BY MOUTH ONCE DAILY  . NONFORMULARY OR COMPOUNDED ITEM See pharmacy note  . pantoprazole (PROTONIX) 40 MG tablet   . simvastatin (ZOCOR) 40 MG tablet Take 40 mg by mouth every evening.  . tamsulosin (FLOMAX) 0.4 MG CAPS capsule Take 0.4 mg by mouth daily.  . traMADol (ULTRAM) 50 MG tablet 50 mg 2 (two) times daily as needed.   . [DISCONTINUED] FLUAD QUADRIVALENT 0.5 ML injection   . [DISCONTINUED] PNEUMOVAX 23 25 MCG/0.5ML injection   . clindamycin (CLEOCIN) 300 MG capsule Take 1 capsule (300 mg total) by mouth 4 (four) times daily.  . [DISCONTINUED] doxycycline (VIBRA-TABS) 100 MG tablet Take 1 tablet (100 mg total) by mouth 2 (two) times daily.  . [DISCONTINUED] ketoconazole (NIZORAL) 2 % cream APPLY 1 APPLICATION TWICE DAILY AS NEEDED FOR IRRITATION  . [EXPIRED] cefTRIAXone (ROCEPHIN) injection 1 g    No facility-administered encounter medications on file as of 07/11/2020.

## 2020-07-12 ENCOUNTER — Encounter: Payer: Self-pay | Admitting: Family Medicine

## 2020-07-12 ENCOUNTER — Ambulatory Visit (INDEPENDENT_AMBULATORY_CARE_PROVIDER_SITE_OTHER): Payer: PPO | Admitting: Internal Medicine

## 2020-07-12 ENCOUNTER — Encounter: Payer: Self-pay | Admitting: Internal Medicine

## 2020-07-12 VITALS — BP 102/60 | HR 86 | Temp 98.4°F | Ht 70.0 in | Wt 162.0 lb

## 2020-07-12 DIAGNOSIS — L03113 Cellulitis of right upper limb: Secondary | ICD-10-CM | POA: Diagnosis not present

## 2020-07-12 DIAGNOSIS — S61419A Laceration without foreign body of unspecified hand, initial encounter: Secondary | ICD-10-CM | POA: Insufficient documentation

## 2020-07-12 DIAGNOSIS — S61411A Laceration without foreign body of right hand, initial encounter: Secondary | ICD-10-CM

## 2020-07-12 DIAGNOSIS — Z23 Encounter for immunization: Secondary | ICD-10-CM

## 2020-07-12 MED ORDER — CEFTRIAXONE SODIUM 1 G IJ SOLR
1.0000 g | Freq: Once | INTRAMUSCULAR | Status: AC
  Administered 2020-07-12: 1 g via INTRAMUSCULAR

## 2020-07-12 NOTE — Assessment & Plan Note (Signed)
From dog Will update tetanus Okay to wash with soap and water and keep open in the day

## 2020-07-12 NOTE — Progress Notes (Signed)
Subjective:    Patient ID: Leon Estes, male    DOB: 03/05/1931, 86 y.o.   MRN: [id number]  HPI Here with wife for follow up of infected right hand This visit occurred during the SARS-CoV-2 public health emergency.  Safety protocols were in place, including screening questions prior to the visit, additional usage of staff PPE, and extensive cleaning of exam room while observing appropriate contact time as indicated for disinfecting solutions.   No problems with the injection or clinda Still some discharge at the site Redness in wrist/hand is better No fever  Current Outpatient Medications on File Prior to Visit  Medication Sig Dispense Refill  . aspirin EC 81 MG tablet Take 81 mg by mouth daily.    . betamethasone dipropionate 0.05 % cream Apply topically 2 (two) times daily. 45 g 1  . budesonide-formoterol (SYMBICORT) 160-4.5 MCG/ACT inhaler Inhale 2 puffs into the lungs daily.    . cetirizine (ZYRTEC) 10 MG tablet Take 10 mg by mouth daily.    . clindamycin (CLEOCIN) 300 MG capsule Take 1 capsule (300 mg total) by mouth 4 (four) times daily. 40 capsule 0  . fluconazole (DIFLUCAN) 100 MG tablet Take 1 tablet (100 mg total) by mouth daily. 7 tablet 0  . gabapentin (NEURONTIN) 300 MG capsule Take 300 mg by mouth at bedtime.    Marland Kitchen gentamicin cream (GARAMYCIN) 0.1 % Apply 1 application topically 2 (two) times daily. 30 g 1  . lisinopril-hydrochlorothiazide (PRINZIDE,ZESTORETIC) 20-25 MG per tablet Take 1 tablet by mouth daily.    . metoprolol succinate (TOPROL-XL) 25 MG 24 hr tablet TAKE 1 TABLET BY MOUTH ONCE DAILY 30 tablet 11  . NONFORMULARY OR COMPOUNDED ITEM See pharmacy note 120 each 2  . pantoprazole (PROTONIX) 40 MG tablet     . simvastatin (ZOCOR) 40 MG tablet Take 40 mg by mouth every evening.    . tamsulosin (FLOMAX) 0.4 MG CAPS capsule Take 0.4 mg by mouth daily.    . traMADol (ULTRAM) 50 MG tablet 50 mg 2 (two) times daily as needed.      No current facility-administered  medications on file prior to visit.    Allergies  Allergen Reactions  . Sulfa Antibiotics Rash  . Neosporin [Neomycin-Bacitracin Zn-Polymyx]   . Augmentin [Amoxicillin-Pot Clavulanate] Rash  . Terbinafine And Related Rash    Past Medical History:  Diagnosis Date  . Asthma   . BPH (benign prostatic hypertrophy)   . Cataract   . Chronic renal disease, stage III ([address])   . Chronic sinusitis   . COPD (chronic obstructive pulmonary disease) ([place])   . Coronary artery disease    2 stents  . Gall stones   . Hearing loss    DOES NOT WEAR HEARING AIDS  . High cholesterol   . Hypertension   . Macular degeneration    right eye    Past Surgical History:  Procedure Laterality Date  . APPENDECTOMY    . CORONARY ANGIOPLASTY WITH STENT PLACEMENT  1998   2 stents  . TRANSURETHRAL RESECTION OF PROSTATE      Family History  Problem Relation Age of Onset  . Diabetes Father   . Heart disease Brother     Social History   Socioeconomic History  . Marital status: Married    Spouse name: Not on file  . Number of children: 4  . Years of education: Not on file  . Highest education level: Not on file  Occupational History  . Occupation:  Drove truck and concrete work  Tobacco Use  . Smoking status: Former Smoker    Types: Cigarettes    Quit date: 08/13/2005    Years since quitting: 14.9  . Smokeless tobacco: Current User    Types: Chew  . Tobacco comment: discussed stopping this (only once in a while)  Vaping Use  . Vaping Use: Never used  Substance and Sexual Activity  . Alcohol use: No  . Drug use: No  . Sexual activity: Not on file  Other Topics Concern  . Not on file  Social History Narrative   No living will   Wife, then daughter should make decisions   Would accept resuscitation   Would probably accept tube feeds   Social Determinants of Health   Financial Resource Strain:   . Difficulty of Paying Living Expenses: Not on file  Food Insecurity:   . Worried About  Charity fundraiser in the Last Year: Not on file  . Ran Out of Food in the Last Year: Not on file  Transportation Needs:   . Lack of Transportation (Medical): Not on file  . Lack of Transportation (Non-Medical): Not on file  Physical Activity:   . Days of Exercise per Week: Not on file  . Minutes of Exercise per Session: Not on file  Stress:   . Feeling of Stress : Not on file  Social Connections:   . Frequency of Communication with Friends and Family: Not on file  . Frequency of Social Gatherings with Friends and Family: Not on file  . Attends Religious Services: Not on file  . Active Member of Clubs or Organizations: Not on file  . Attends Archivist Meetings: Not on file  . Marital Status: Not on file  Intimate Partner Violence:   . Fear of Current or Ex-Partner: Not on file  . Emotionally Abused: Not on file  . Physically Abused: Not on file  . Sexually Abused: Not on file   Review of Systems No N/V No diarrhea    Objective:   Physical Exam Skin:    Comments: Ulcerated area on hand (tear injury) looks about the same---slight slough Clear improvement in redness/warmth at right wrist            Assessment & Plan:

## 2020-07-12 NOTE — Addendum Note (Signed)
Addended by: Pilar Grammes on: 07/12/2020 12:56 PM   Modules accepted: Orders

## 2020-07-12 NOTE — Assessment & Plan Note (Signed)
Better  Will give rocephin again Update tetanus Continue clinda Recheck 3 days

## 2020-07-14 ENCOUNTER — Other Ambulatory Visit: Payer: Self-pay | Admitting: Internal Medicine

## 2020-07-15 ENCOUNTER — Ambulatory Visit (INDEPENDENT_AMBULATORY_CARE_PROVIDER_SITE_OTHER): Payer: PPO | Admitting: Internal Medicine

## 2020-07-15 ENCOUNTER — Other Ambulatory Visit: Payer: Self-pay

## 2020-07-15 ENCOUNTER — Encounter: Payer: Self-pay | Admitting: Internal Medicine

## 2020-07-15 VITALS — BP 102/60 | HR 72 | Temp 98.3°F | Ht 70.0 in | Wt 168.0 lb

## 2020-07-15 DIAGNOSIS — L03113 Cellulitis of right upper limb: Secondary | ICD-10-CM | POA: Diagnosis not present

## 2020-07-15 NOTE — Progress Notes (Signed)
Subjective:    Patient ID: Leon Estes, male    DOB: 1930/08/31, 86 y.o.   MRN: [id number]  HPI Here with wife for follow up of cellulitis of hand This visit occurred during the SARS-CoV-2 public health emergency.  Safety protocols were in place, including screening questions prior to the visit, additional usage of staff PPE, and extensive cleaning of exam room while observing appropriate contact time as indicated for disinfecting solutions.   He feels the hand finally is better Still swollen but the pain is better No fever No discharge--but wound still open  Current Outpatient Medications on File Prior to Visit  Medication Sig Dispense Refill  . aspirin EC 81 MG tablet Take 81 mg by mouth daily.    . betamethasone dipropionate 0.05 % cream Apply topically 2 (two) times daily. 45 g 1  . budesonide-formoterol (SYMBICORT) 160-4.5 MCG/ACT inhaler Inhale 2 puffs into the lungs daily.    . cetirizine (ZYRTEC) 10 MG tablet Take 10 mg by mouth daily.    . clindamycin (CLEOCIN) 300 MG capsule Take 1 capsule (300 mg total) by mouth 4 (four) times daily. 40 capsule 0  . gabapentin (NEURONTIN) 300 MG capsule Take 300 mg by mouth at bedtime.    Marland Kitchen gentamicin cream (GARAMYCIN) 0.1 % Apply 1 application topically 2 (two) times daily. 30 g 1  . lisinopril-hydrochlorothiazide (PRINZIDE,ZESTORETIC) 20-25 MG per tablet Take 1 tablet by mouth daily.    . metoprolol succinate (TOPROL-XL) 25 MG 24 hr tablet TAKE 1 TABLET BY MOUTH ONCE DAILY 30 tablet 11  . NONFORMULARY OR COMPOUNDED ITEM See pharmacy note 120 each 2  . pantoprazole (PROTONIX) 40 MG tablet     . simvastatin (ZOCOR) 40 MG tablet Take 40 mg by mouth every evening.    . tamsulosin (FLOMAX) 0.4 MG CAPS capsule TAKE 1 CAPSULE BY MOUTH ONCE DAILY 90 capsule 3  . traMADol (ULTRAM) 50 MG tablet 50 mg 2 (two) times daily as needed.      No current facility-administered medications on file prior to visit.    Allergies  Allergen Reactions  .  Sulfa Antibiotics Rash  . Neosporin [Neomycin-Bacitracin Zn-Polymyx]   . Augmentin [Amoxicillin-Pot Clavulanate] Rash  . Terbinafine And Related Rash    Past Medical History:  Diagnosis Date  . Asthma   . BPH (benign prostatic hypertrophy)   . Cataract   . Chronic renal disease, stage III ([place])   . Chronic sinusitis   . COPD (chronic obstructive pulmonary disease) ([place])   . Coronary artery disease    2 stents  . Gall stones   . Hearing loss    DOES NOT WEAR HEARING AIDS  . High cholesterol   . Hypertension   . Macular degeneration    right eye    Past Surgical History:  Procedure Laterality Date  . APPENDECTOMY    . CORONARY ANGIOPLASTY WITH STENT PLACEMENT  1998   2 stents  . TRANSURETHRAL RESECTION OF PROSTATE      Family History  Problem Relation Age of Onset  . Diabetes Father   . Heart disease Brother     Social History   Socioeconomic History  . Marital status: Married    Spouse name: Not on file  . Number of children: 4  . Years of education: Not on file  . Highest education level: Not on file  Occupational History  . Occupation: Drove truck and concrete work  Tobacco Use  . Smoking status: Former Smoker  Types: Cigarettes    Quit date: 08/13/2005    Years since quitting: 14.9  . Smokeless tobacco: Current User    Types: Chew  . Tobacco comment: discussed stopping this (only once in a while)  Vaping Use  . Vaping Use: Never used  Substance and Sexual Activity  . Alcohol use: No  . Drug use: No  . Sexual activity: Not on file  Other Topics Concern  . Not on file  Social History Narrative   No living will   Wife, then daughter should make decisions   Would accept resuscitation   Would probably accept tube feeds   Social Determinants of Health   Financial Resource Strain:   . Difficulty of Paying Living Expenses: Not on file  Food Insecurity:   . Worried About Charity fundraiser in the Last Year: Not on file  . Ran Out of Food in the  Last Year: Not on file  Transportation Needs:   . Lack of Transportation (Medical): Not on file  . Lack of Transportation (Non-Medical): Not on file  Physical Activity:   . Days of Exercise per Week: Not on file  . Minutes of Exercise per Session: Not on file  Stress:   . Feeling of Stress : Not on file  Social Connections:   . Frequency of Communication with Friends and Family: Not on file  . Frequency of Social Gatherings with Friends and Family: Not on file  . Attends Religious Services: Not on file  . Active Member of Clubs or Organizations: Not on file  . Attends Archivist Meetings: Not on file  . Marital Status: Not on file  Intimate Partner Violence:   . Fear of Current or Ex-Partner: Not on file  . Emotionally Abused: Not on file  . Physically Abused: Not on file  . Sexually Abused: Not on file   Review of Systems No N/V Eating fine No diarrhea    Objective:   Physical Exam Constitutional:      Appearance: Normal appearance.  Skin:    Comments: Right hand and wrist are puffy and slightly warm (but not really warmer than left hand) Ulcer now has clean granulation tissue and no slough No redness  Neurological:     Mental Status: He is alert.            Assessment & Plan:

## 2020-07-15 NOTE — Assessment & Plan Note (Signed)
Clearly improved Will finish out the 10 days of clinda. If not quite done, could extend another 5 days (they will call)

## 2020-07-21 ENCOUNTER — Other Ambulatory Visit: Payer: Self-pay | Admitting: Internal Medicine

## 2020-07-22 ENCOUNTER — Telehealth: Payer: Self-pay | Admitting: Internal Medicine

## 2020-07-22 MED ORDER — CLINDAMYCIN HCL 300 MG PO CAPS
300.0000 mg | ORAL_CAPSULE | Freq: Three times a day (TID) | ORAL | 0 refills | Status: DC
Start: 2020-07-22 — End: 2020-10-12

## 2020-07-22 NOTE — Telephone Encounter (Signed)
Okay to send him another #15 x 0 Tid  Clinda 300mg 

## 2020-07-22 NOTE — Telephone Encounter (Signed)
Patient was put on Clindamycin for cellulitis by Dr.Letvak.  Dr.Letvak told patient if he needed more, to call.  Patient's hand is doing better, but it's swollen across his knuckles and he still has open skin on top of his hand. Patient's requesting a refill.

## 2020-07-22 NOTE — Telephone Encounter (Signed)
Opened in error

## 2020-07-22 NOTE — Addendum Note (Signed)
Addended by: Pilar Grammes on: 07/22/2020 02:23 PM   Modules accepted: Orders

## 2020-07-22 NOTE — Telephone Encounter (Signed)
Rx sent electronically.  Tried to call pt. Left message on VM for pt.

## 2020-08-02 ENCOUNTER — Telehealth: Payer: Self-pay | Admitting: Internal Medicine

## 2020-08-02 MED ORDER — METOPROLOL SUCCINATE ER 25 MG PO TB24
25.0000 mg | ORAL_TABLET | Freq: Every day | ORAL | 11 refills | Status: DC
Start: 2020-08-02 — End: 2021-03-14

## 2020-08-02 NOTE — Telephone Encounter (Signed)
Rx sent electronically.  

## 2020-08-02 NOTE — Telephone Encounter (Signed)
Medication Refill - Medication:  metoprolol succinate (TOPROL-XL) 25 MG 24 hr tablet   Has the patient contacted their pharmacy? Yes supposed to have fax but not received.   Preferred Pharmacy (with phone number or street name):  [facility] - [place] - [address] Phone:  [phone]  Fax:  [phone]

## 2020-08-12 ENCOUNTER — Other Ambulatory Visit: Payer: Self-pay | Admitting: Podiatry

## 2020-08-15 ENCOUNTER — Other Ambulatory Visit: Payer: Self-pay | Admitting: Internal Medicine

## 2020-08-21 NOTE — Telephone Encounter (Signed)
Please advise 

## 2020-08-23 ENCOUNTER — Other Ambulatory Visit: Payer: Self-pay

## 2020-08-23 MED ORDER — BETAMETHASONE DIPROPIONATE 0.05 % EX CREA
TOPICAL_CREAM | Freq: Two times a day (BID) | CUTANEOUS | 1 refills | Status: DC
Start: 2020-08-23 — End: 2021-01-23

## 2020-08-23 NOTE — Progress Notes (Signed)
Patient called requesting refill of betamethasone cream.  Per Dr. Amalia Hailey verbal order, ok to refill.  Script has been sent to pharmacy

## 2020-09-06 ENCOUNTER — Telehealth (INDEPENDENT_AMBULATORY_CARE_PROVIDER_SITE_OTHER): Payer: PPO | Admitting: Family Medicine

## 2020-09-06 ENCOUNTER — Other Ambulatory Visit (INDEPENDENT_AMBULATORY_CARE_PROVIDER_SITE_OTHER): Payer: PPO

## 2020-09-06 ENCOUNTER — Encounter: Payer: Self-pay | Admitting: Family Medicine

## 2020-09-06 ENCOUNTER — Telehealth: Payer: Self-pay

## 2020-09-06 VITALS — Temp 101.2°F | Ht 70.0 in

## 2020-09-06 DIAGNOSIS — I2510 Atherosclerotic heart disease of native coronary artery without angina pectoris: Secondary | ICD-10-CM | POA: Diagnosis not present

## 2020-09-06 DIAGNOSIS — J449 Chronic obstructive pulmonary disease, unspecified: Secondary | ICD-10-CM | POA: Diagnosis not present

## 2020-09-06 DIAGNOSIS — R509 Fever, unspecified: Secondary | ICD-10-CM

## 2020-09-06 DIAGNOSIS — R197 Diarrhea, unspecified: Secondary | ICD-10-CM | POA: Diagnosis not present

## 2020-09-06 LAB — POCT INFLUENZA A/B
Influenza A, POC: NEGATIVE
Influenza B, POC: NEGATIVE

## 2020-09-06 MED ORDER — ALBUTEROL SULFATE HFA 108 (90 BASE) MCG/ACT IN AERS: 2.0000 | INHALATION_SPRAY | Freq: Four times a day (QID) | RESPIRATORY_TRACT | 2 refills | Status: DC | PRN

## 2020-09-06 NOTE — Progress Notes (Signed)
Lab appointment scheduled today at 3:00 pm for Covid testing and lab work.. Back door information provided.

## 2020-09-06 NOTE — Telephone Encounter (Signed)
Pt already has virtual phone visit scheduled for 09/06/20 at 12:20 with Dr Diona Browner today.

## 2020-09-06 NOTE — Telephone Encounter (Signed)
[facility] Night - Client Nonclinical Telephone Record AccessNurse Client [place] Night - Client Client Site [place] Physician Viviana Simpler- MD Contact Type Call Who Is Calling Patient / Member / Family / Caregiver Caller Name [name] Caller Phone Number [phone] Patient Name Leon Estes Patient DOB 03/29/1931 Call Type Message Only Information Provided Reason for Call Request to Schedule Office Appointment Initial Comment Caller states, pt has fever x 3 days, diarrhea. Wanting antibiotics. Rattling in lungs. Declined rn Disp. Time Disposition Final User 09/06/2020 7:40:15 AM General Information Provided Yes Lonia Farber Call Closed By: Lonia Farber Transaction Date/Time: 09/06/2020 7:38:26 AM (ET)

## 2020-09-06 NOTE — Progress Notes (Signed)
VIRTUAL VISIT Due to national recommendations of social distancing due to [place] 19, a virtual visit is felt to be most appropriate for this patient at this time.   I connected with the patient on 09/06/20 at 12:20 PM EST by virtual telehealth platform and verified that I am speaking with the correct person using two identifiers.   I discussed the limitations, risks, security and privacy concerns of performing an evaluation and management service by  virtual telehealth platform and the availability of in person appointments. I also discussed with the patient that there may be a patient responsible charge related to this service. The patient expressed understanding and agreed to proceed.  Patient location: Home Provider Location: [place] [facility] Participants: Eliezer Lofts and [name]   Chief Complaint  Patient presents with  . Fever  . Chills  . Diarrhea    History of Present Illness: 87 year old male with history of  COPD, CAD and HTN history presents with new fever.   His wife provides a lot of the history as he is hard of hearing.  He started with new onset diarrhea on 09/02/20 .Marland Kitchen 3-4 BMs daily... watery.. yesterday started getting solid.. no BM yet today. No N/V  Fever started  1/22.. continue off and on.. this AM 101.2 F, chills... using tylenol.  Normal UOP No cough, no SOB change.Taking daily Symbicort. No chest pain.  Family member EMT.. heard crackling in right lung.  106/62.today.. drinking ginger ale.  12 oz so far today. BP Readings from Last 3 Encounters:  07/15/20 102/60  07/12/20 102/60  07/11/20 110/60    Wt Readings from Last 3 Encounters:  07/15/20 168 lb (76.2 kg)  07/12/20 162 lb (73.5 kg)  07/11/20 162 lb (73.5 kg)    Fell 1/22.Marland Kitchen tripped and fell on butt, hit back.. sore from that, no head injury, no LOC.Marland Kitchen sore in hip where hit TV stand.  COVID 19 screen COVID testing: none COVID vaccine: [place] 12/31/2019 , 12/03/2019.. no boost, has  gotten flu and pneumonia vaccine. COVID exposure: No recent travel or known exposure to [place]  The importance of social distancing was discussed today.   Review of Systems  Constitutional: Positive for fever and malaise/fatigue. Negative for chills.  HENT: Negative for congestion and ear pain.   Eyes: Negative for pain and redness.  Respiratory: Negative for cough and shortness of breath.   Cardiovascular: Negative for chest pain, palpitations and leg swelling.  Gastrointestinal: Positive for diarrhea. Negative for abdominal pain, blood in stool, constipation, nausea and vomiting.  Genitourinary: Negative for dysuria.  Musculoskeletal: Negative for falls and myalgias.  Skin: Negative for rash.  Neurological: Negative for dizziness.  Psychiatric/Behavioral: Negative for depression. The patient is not nervous/anxious.       Past Medical History:  Diagnosis Date  . Asthma   . BPH (benign prostatic hypertrophy)   . Cataract   . Chronic renal disease, stage III ([place])   . Chronic sinusitis   . COPD (chronic obstructive pulmonary disease) ([place])   . Coronary artery disease    2 stents  . Gall stones   . Hearing loss    DOES NOT WEAR HEARING AIDS  . High cholesterol   . Hypertension   . Macular degeneration    right eye    reports that he quit smoking about 15 years ago. His smoking use included cigarettes. His smokeless tobacco use includes chew. He reports that he does not drink alcohol and does not use drugs.  Current Outpatient Medications:  .  aspirin EC 81 MG tablet, Take 81 mg by mouth daily., Disp: , Rfl:  .  betamethasone dipropionate 0.05 % cream, Apply topically 2 (two) times daily., Disp: 45 g, Rfl: 1 .  budesonide-formoterol (SYMBICORT) 160-4.5 MCG/ACT inhaler, Inhale 2 puffs into the lungs daily., Disp: , Rfl:  .  cetirizine (ZYRTEC) 10 MG tablet, Take 10 mg by mouth daily., Disp: , Rfl:  .  clindamycin (CLEOCIN) 300 MG capsule, Take 1 capsule (300 mg total) by  mouth 3 (three) times daily., Disp: 15 capsule, Rfl: 0 .  gabapentin (NEURONTIN) 300 MG capsule, Take 300 mg by mouth at bedtime., Disp: , Rfl:  .  gentamicin cream (GARAMYCIN) 0.1 %, APPLY 1 APPLICATION TOPICALLY TWICE DAILY, Disp: 30 g, Rfl: 1 .  lisinopril-hydrochlorothiazide (ZESTORETIC) 20-25 MG tablet, TAKE 1 TABLET BY MOUTH ONCE DAILY, Disp: 90 tablet, Rfl: 3 .  metoprolol succinate (TOPROL-XL) 25 MG 24 hr tablet, Take 1 tablet (25 mg total) by mouth daily., Disp: 30 tablet, Rfl: 11 .  NONFORMULARY OR COMPOUNDED ITEM, See pharmacy note, Disp: 120 each, Rfl: 2 .  pantoprazole (PROTONIX) 40 MG tablet, TAKE 1 TABLET BY MOUTH ONCE A DAY, Disp: 90 tablet, Rfl: 3 .  simvastatin (ZOCOR) 40 MG tablet, Take 40 mg by mouth every evening., Disp: , Rfl:  .  tamsulosin (FLOMAX) 0.4 MG CAPS capsule, TAKE 1 CAPSULE BY MOUTH ONCE DAILY, Disp: 90 capsule, Rfl: 3 .  traMADol (ULTRAM) 50 MG tablet, 50 mg 2 (two) times daily as needed. , Disp: , Rfl:    Observations/Objective: Temperature (!) 101.2 F (38.4 C), temperature source Oral, height 5\' 10"  (1.778 m).  Physical Exam  Physical Exam Constitutional:      General: The patient is not in acute distress. Pulmonary:     Effort: Pulmonary effort is normal. No respiratory distress.  Neurological:     Mental Status: The patient is alert and oriented to person, place, and time.  Psychiatric:        Mood and Affect: Mood normal.        Behavior: Behavior normal.   Assessment and Plan High level of medical descision making given complicated history, severity  And acuity of symptoms. Problem List Items Addressed This Visit    CAD (coronary artery disease)   COPD (chronic obstructive pulmonary disease) ([place])    Unclear if family member exam hearing respiratory sounds is chronic change versus new. Pt denies change in respiratory symptoms at this time.  Sent in rx for albuterol to use pre. Continue daily controller symbicort      Relevant Medications    albuterol (VENTOLIN HFA) 108 (90 Base) MCG/ACT inhaler   Diarrhea of presumed infectious origin     Greatest concern is for dehydration in setting of CKD.. Encouraged keeping up with fluid intake. If unable to tolerate po intake or excessive stool losses return .. recommend in person exam.      Relevant Orders   CBC with Differential/Platelet (Completed)   Comprehensive metabolic panel (Completed)   Fever - Primary    Unknown etiology..possible gastroenteritis. Minimal respiratory symptoms but family member noted crackles in lungs... unknown baseline and unable to examine patient.  Will eval with labs, CMET and COVID test for presumed infection.  Strict ER precautions reviewed with wife.      Relevant Orders   Novel Coronavirus, NAA ([facility]) (Completed)   POC Influenza A&B(BINAX/QUICKVUE)   CBC with Differential/Platelet (Completed)   Comprehensive metabolic panel (Completed)  Orders Placed This Encounter  Procedures  . Novel Coronavirus, NAA ([facility])    Standing Status:   Future    Number of Occurrences:   1    Standing Expiration Date:   09/06/2021    Order Specific Question:   Is this test for diagnosis or screening    Answer:   Diagnosis of ill patient    Order Specific Question:   Symptomatic for COVID-19 as defined by CDC    Answer:   Yes    Order Specific Question:   Date of Symptom Onset    Answer:   09/02/2020    Order Specific Question:   Hospitalized for COVID-19    Answer:   No    Order Specific Question:   Admitted to ICU for COVID-19    Answer:   No    Order Specific Question:   Resident in a congregate (group) care setting    Answer:   No    Order Specific Question:   Employed in healthcare setting    Answer:   No    Order Specific Question:   Previously tested for COVID-19    Answer:   No  . CBC with Differential/Platelet    Standing Status:   Future    Number of Occurrences:   1    Standing Expiration Date:   09/06/2021  . Comprehensive  metabolic panel    Standing Status:   Future    Number of Occurrences:   1    Standing Expiration Date:   09/06/2021  . POC Influenza A&B(BINAX/QUICKVUE)    Standing Status:   Future    Standing Expiration Date:   10/07/2020    Meds ordered this encounter  Medications  . albuterol (VENTOLIN HFA) 108 (90 Base) MCG/ACT inhaler    Sig: Inhale 2 puffs into the lungs every 6 (six) hours as needed for wheezing or shortness of breath.    Dispense:  8 g    Refill:  2      I discussed the assessment and treatment plan with the patient. The patient was provided an opportunity to ask questions and all were answered. The patient agreed with the plan and demonstrated an understanding of the instructions.   The patient was advised to call back or seek an in-person evaluation if the symptoms worsen or if the condition fails to improve as anticipated.     Eliezer Lofts, MD

## 2020-09-06 NOTE — Progress Notes (Signed)
p 

## 2020-09-07 LAB — COMPREHENSIVE METABOLIC PANEL
ALT: 21 U/L (ref 0–53)
AST: 30 U/L (ref 0–37)
Albumin: 3.5 g/dL (ref 3.5–5.2)
Alkaline Phosphatase: 68 U/L (ref 39–117)
BUN: 45 mg/dL — ABNORMAL HIGH (ref 6–23)
CO2: 22 mEq/L (ref 19–32)
Calcium: 8.6 mg/dL (ref 8.4–10.5)
Chloride: 104 mEq/L (ref 96–112)
Creatinine, Ser: 2.21 mg/dL — ABNORMAL HIGH (ref 0.40–1.50)
GFR: 26.14 mL/min — ABNORMAL LOW (ref >60.00)
Glucose, Bld: 141 mg/dL — ABNORMAL HIGH (ref 70–99)
Potassium: 3.4 mEq/L — ABNORMAL LOW (ref 3.5–5.1)
Sodium: 136 mEq/L (ref 135–145)
Total Bilirubin: 0.5 mg/dL (ref 0.2–1.2)
Total Protein: 6.7 g/dL (ref 6.0–8.3)

## 2020-09-07 LAB — CBC WITH DIFFERENTIAL/PLATELET
Basophils Absolute: 0.1 10*3/uL (ref 0.0–0.1)
Basophils Relative: 0.5 % (ref 0.0–3.0)
Eosinophils Absolute: 0.1 10*3/uL (ref 0.0–0.7)
Eosinophils Relative: 0.4 % (ref 0.0–5.0)
HCT: 32.8 % — ABNORMAL LOW (ref 39.0–52.0)
Hemoglobin: 10.7 g/dL — ABNORMAL LOW (ref 13.0–17.0)
Lymphocytes Relative: 11.5 % — ABNORMAL LOW (ref 12.0–46.0)
Lymphs Abs: 1.7 10*3/uL (ref 0.7–4.0)
MCHC: 32.7 g/dL (ref 30.0–36.0)
MCV: 90.8 fl (ref 78.0–100.0)
Monocytes Absolute: 2 10*3/uL — ABNORMAL HIGH (ref 0.1–1.0)
Monocytes Relative: 13.6 % — ABNORMAL HIGH (ref 3.0–12.0)
Neutro Abs: 11.1 10*3/uL — ABNORMAL HIGH (ref 1.4–7.7)
Neutrophils Relative %: 74 % (ref 43.0–77.0)
Platelets: 242 10*3/uL (ref 150.0–400.0)
RBC: 3.62 Mil/uL — ABNORMAL LOW (ref 4.22–5.81)
RDW: 14.8 % (ref 11.5–15.5)
WBC: 15 10*3/uL — ABNORMAL HIGH (ref 4.0–10.5)

## 2020-09-08 ENCOUNTER — Other Ambulatory Visit: Payer: Self-pay | Admitting: Family Medicine

## 2020-09-08 DIAGNOSIS — R197 Diarrhea, unspecified: Secondary | ICD-10-CM

## 2020-09-08 DIAGNOSIS — R509 Fever, unspecified: Secondary | ICD-10-CM

## 2020-09-08 LAB — NOVEL CORONAVIRUS, NAA: SARS-CoV-2, NAA: NOT DETECTED

## 2020-09-08 LAB — SARS-COV-2, NAA 2 DAY TAT

## 2020-09-08 MED ORDER — DOXYCYCLINE HYCLATE 100 MG PO TABS: 100.0000 mg | ORAL_TABLET | Freq: Two times a day (BID) | ORAL | 0 refills | Status: DC

## 2020-09-08 NOTE — Assessment & Plan Note (Signed)
Unclear if family member exam hearing respiratory sounds is chronic change versus new. Pt denies change in respiratory symptoms at this time.  Sent in rx for albuterol to use pre. Continue daily controller symbicort

## 2020-09-08 NOTE — Assessment & Plan Note (Signed)
Greatest concern is for dehydration in setting of CKD.. Encouraged keeping up with fluid intake. If unable to tolerate po intake or excessive stool losses return .. recommend in person exam.

## 2020-09-08 NOTE — Assessment & Plan Note (Signed)
Unknown etiology..possible gastroenteritis. Minimal respiratory symptoms but family member noted crackles in lungs... unknown baseline and unable to examine patient.  Will eval with labs, CMET and COVID test for presumed infection.  Strict ER precautions reviewed with wife.

## 2020-09-09 ENCOUNTER — Ambulatory Visit: Payer: PPO | Admitting: Family Medicine

## 2020-09-09 ENCOUNTER — Other Ambulatory Visit (INDEPENDENT_AMBULATORY_CARE_PROVIDER_SITE_OTHER): Payer: PPO

## 2020-09-09 DIAGNOSIS — R197 Diarrhea, unspecified: Secondary | ICD-10-CM | POA: Diagnosis not present

## 2020-09-09 DIAGNOSIS — R509 Fever, unspecified: Secondary | ICD-10-CM | POA: Diagnosis not present

## 2020-09-10 LAB — CBC WITH DIFFERENTIAL/PLATELET
Absolute Monocytes: 2317 cells/uL — ABNORMAL HIGH (ref 200–950)
Basophils Absolute: 140 cells/uL (ref 0–200)
Basophils Relative: 0.6 %
Eosinophils Absolute: 234 cells/uL (ref 15–500)
Eosinophils Relative: 1 %
HCT: 35.3 % — ABNORMAL LOW (ref 38.5–50.0)
Hemoglobin: 11.9 g/dL — ABNORMAL LOW (ref 13.2–17.1)
Lymphs Abs: 2363 cells/uL (ref 850–3900)
MCH: 29.8 pg (ref 27.0–33.0)
MCHC: 33.7 g/dL (ref 32.0–36.0)
MCV: 88.3 fL (ref 80.0–100.0)
MPV: 10.6 fL (ref 7.5–12.5)
Monocytes Relative: 9.9 %
Neutro Abs: 18346 cells/uL — ABNORMAL HIGH (ref 1500–7800)
Neutrophils Relative %: 78.4 %
Platelets: 415 10*3/uL — ABNORMAL HIGH (ref 140–400)
RBC: 4 10*6/uL — ABNORMAL LOW (ref 4.20–5.80)
RDW: 13.6 % (ref 11.0–15.0)
Total Lymphocyte: 10.1 %
WBC: 23.4 10*3/uL — ABNORMAL HIGH (ref 3.8–10.8)

## 2020-09-10 LAB — BASIC METABOLIC PANEL
BUN/Creatinine Ratio: 23 (calc) — ABNORMAL HIGH (ref 6–22)
BUN: 58 mg/dL — ABNORMAL HIGH (ref 7–25)
CO2: 20 mmol/L (ref 20–32)
Calcium: 8.8 mg/dL (ref 8.6–10.3)
Chloride: 103 mmol/L (ref 98–110)
Creat: 2.47 mg/dL — ABNORMAL HIGH (ref 0.70–1.11)
Glucose, Bld: 103 mg/dL — ABNORMAL HIGH (ref 65–99)
Potassium: 3.6 mmol/L (ref 3.5–5.3)
Sodium: 137 mmol/L (ref 135–146)

## 2020-09-12 ENCOUNTER — Encounter: Payer: Self-pay | Admitting: *Deleted

## 2020-09-12 ENCOUNTER — Telehealth: Payer: Self-pay

## 2020-09-12 ENCOUNTER — Emergency Department
Admission: EM | Admit: 2020-09-12 | Discharge: 2020-09-12 | Disposition: A | Discharge status: Home / Self Care | Payer: PPO | Attending: Emergency Medicine | Admitting: Emergency Medicine

## 2020-09-12 ENCOUNTER — Emergency Department: Payer: PPO

## 2020-09-12 ENCOUNTER — Other Ambulatory Visit: Payer: Self-pay

## 2020-09-12 DIAGNOSIS — J449 Chronic obstructive pulmonary disease, unspecified: Secondary | ICD-10-CM | POA: Diagnosis not present

## 2020-09-12 DIAGNOSIS — A419 Sepsis, unspecified organism: Secondary | ICD-10-CM | POA: Diagnosis not present

## 2020-09-12 DIAGNOSIS — D72829 Elevated white blood cell count, unspecified: Secondary | ICD-10-CM | POA: Insufficient documentation

## 2020-09-12 DIAGNOSIS — Z5321 Procedure and treatment not carried out due to patient leaving prior to being seen by health care provider: Secondary | ICD-10-CM | POA: Diagnosis not present

## 2020-09-12 NOTE — Telephone Encounter (Addendum)
pts wife said pt went to ED and did xray,  and pt walked out; pts wife said pt did not see a provider because pt left because pt did not want to wait. There are labs ordered in pts chart at ED but were not drawn because pt left. Leon Estes, pt and daughter  Are on speaker phone and reviewed with all 3 that pt should go back to ED for eval and testing and I reviewed why Dr Diona Browner wanted pt to go to ED again. Pt, his wife and daughter voiced understanding and pt said he might go back in the morning. pts daughter said they will talk with pt to see if can get pt to go back to ED now. Some one will cb with update on how pt is doing on 09/13/20.sending note to Dr Diona Browner who is not in office, Butch Penny CMA for Dr Diona Browner; Dr Silvio Pate and Larene Beach CMA.

## 2020-09-12 NOTE — Telephone Encounter (Signed)
Okay I will wait for the ER evaluation and disposition

## 2020-09-12 NOTE — ED Triage Notes (Signed)
Pt is here due to being sent by MD for getting IV fluids and evaluating increasing WBC. See note in epic as pt is poor historian.

## 2020-09-12 NOTE — Telephone Encounter (Signed)
pts wife called (DPR signed) and she got call from [facility] and was advised to go to ED for IV fluids and get cause of infection cked on. Per lab result note this was Dr Rometta Emery instruction  Infection appears to be worsening... increasing wbc. Kidney function is declining further. He need to go to ER for IV fluids and evaluation for cause of infection   pts wife said that when she told pt he refuses to go to ED and wanted appt to see Dr Silvio Pate; advised no available appts at [facility] today. pts wife let me talk with pt and initially pt did not want to go to ED but after talking more and going over WBC count and that the kidney function was declining and cannot give IV fluids at [facility] pt agreed to let his wife or daughter take him to [facility] ED now. Sending note to Dr Diona Browner, Dr Silvio Pate as PCP and Butch Penny CMA.

## 2020-09-13 ENCOUNTER — Emergency Department: Payer: PPO

## 2020-09-13 ENCOUNTER — Emergency Department
Admission: EM | Admit: 2020-09-13 | Discharge: 2020-09-13 | Disposition: A | Discharge status: Home / Self Care | Payer: PPO | Attending: Student in an Organized Health Care Education/Training Program | Admitting: Student in an Organized Health Care Education/Training Program

## 2020-09-13 ENCOUNTER — Other Ambulatory Visit: Payer: Self-pay

## 2020-09-13 ENCOUNTER — Encounter: Payer: Self-pay | Admitting: Emergency Medicine

## 2020-09-13 DIAGNOSIS — J45909 Unspecified asthma, uncomplicated: Secondary | ICD-10-CM | POA: Diagnosis not present

## 2020-09-13 DIAGNOSIS — K5730 Diverticulosis of large intestine without perforation or abscess without bleeding: Secondary | ICD-10-CM | POA: Diagnosis not present

## 2020-09-13 DIAGNOSIS — N183 Chronic kidney disease, stage 3 unspecified: Secondary | ICD-10-CM | POA: Diagnosis not present

## 2020-09-13 DIAGNOSIS — F17220 Nicotine dependence, chewing tobacco, uncomplicated: Secondary | ICD-10-CM | POA: Diagnosis not present

## 2020-09-13 DIAGNOSIS — E860 Dehydration: Secondary | ICD-10-CM | POA: Insufficient documentation

## 2020-09-13 DIAGNOSIS — N281 Cyst of kidney, acquired: Secondary | ICD-10-CM | POA: Diagnosis not present

## 2020-09-13 DIAGNOSIS — K4090 Unilateral inguinal hernia, without obstruction or gangrene, not specified as recurrent: Secondary | ICD-10-CM | POA: Diagnosis not present

## 2020-09-13 DIAGNOSIS — I129 Hypertensive chronic kidney disease with stage 1 through stage 4 chronic kidney disease, or unspecified chronic kidney disease: Secondary | ICD-10-CM | POA: Insufficient documentation

## 2020-09-13 DIAGNOSIS — R531 Weakness: Secondary | ICD-10-CM | POA: Diagnosis not present

## 2020-09-13 DIAGNOSIS — J449 Chronic obstructive pulmonary disease, unspecified: Secondary | ICD-10-CM | POA: Insufficient documentation

## 2020-09-13 DIAGNOSIS — Z79899 Other long term (current) drug therapy: Secondary | ICD-10-CM | POA: Insufficient documentation

## 2020-09-13 DIAGNOSIS — Z7982 Long term (current) use of aspirin: Secondary | ICD-10-CM | POA: Insufficient documentation

## 2020-09-13 DIAGNOSIS — I700 Atherosclerosis of aorta: Secondary | ICD-10-CM | POA: Diagnosis not present

## 2020-09-13 DIAGNOSIS — Z7951 Long term (current) use of inhaled steroids: Secondary | ICD-10-CM | POA: Insufficient documentation

## 2020-09-13 DIAGNOSIS — Z955 Presence of coronary angioplasty implant and graft: Secondary | ICD-10-CM | POA: Insufficient documentation

## 2020-09-13 DIAGNOSIS — D72829 Elevated white blood cell count, unspecified: Secondary | ICD-10-CM | POA: Diagnosis not present

## 2020-09-13 DIAGNOSIS — I2510 Atherosclerotic heart disease of native coronary artery without angina pectoris: Secondary | ICD-10-CM | POA: Insufficient documentation

## 2020-09-13 DIAGNOSIS — I10 Essential (primary) hypertension: Secondary | ICD-10-CM | POA: Diagnosis not present

## 2020-09-13 LAB — CBC
HCT: 33.7 % — ABNORMAL LOW (ref 39.0–52.0)
Hemoglobin: 11.2 g/dL — ABNORMAL LOW (ref 13.0–17.0)
MCH: 30 pg (ref 26.0–34.0)
MCHC: 33.2 g/dL (ref 30.0–36.0)
MCV: 90.3 fL (ref 80.0–100.0)
Platelets: 459 10*3/uL — ABNORMAL HIGH (ref 150–400)
RBC: 3.73 MIL/uL — ABNORMAL LOW (ref 4.22–5.81)
RDW: 14.5 % (ref 11.5–15.5)
WBC: 12.9 10*3/uL — ABNORMAL HIGH (ref 4.0–10.5)
nRBC: 0 % (ref 0.0–0.2)

## 2020-09-13 LAB — URINALYSIS, COMPLETE (UACMP) WITH MICROSCOPIC
Bacteria, UA: NONE SEEN
Bilirubin Urine: NEGATIVE
Glucose, UA: NEGATIVE mg/dL
Hgb urine dipstick: NEGATIVE
Ketones, ur: NEGATIVE mg/dL
Leukocytes,Ua: NEGATIVE
Nitrite: NEGATIVE
Protein, ur: NEGATIVE mg/dL
Specific Gravity, Urine: 1.019 (ref 1.005–1.030)
Squamous Epithelial / HPF: NONE SEEN (ref 0–5)
pH: 5 (ref 5.0–8.0)

## 2020-09-13 LAB — BASIC METABOLIC PANEL
Anion gap: 10 (ref 5–15)
BUN: 40 mg/dL — ABNORMAL HIGH (ref 8–23)
CO2: 25 mmol/L (ref 22–32)
Calcium: 9.1 mg/dL (ref 8.9–10.3)
Chloride: 105 mmol/L (ref 98–111)
Creatinine, Ser: 1.41 mg/dL — ABNORMAL HIGH (ref 0.61–1.24)
GFR, Estimated: 48 mL/min — ABNORMAL LOW (ref >60)
Glucose, Bld: 124 mg/dL — ABNORMAL HIGH (ref 70–99)
Potassium: 4.1 mmol/L (ref 3.5–5.1)
Sodium: 140 mmol/L (ref 135–145)

## 2020-09-13 MED ORDER — SODIUM CHLORIDE 0.9 % IV BOLUS
500.0000 mL | Freq: Once | INTRAVENOUS | Status: AC
  Administered 2020-09-13: 500 mL via INTRAVENOUS

## 2020-09-13 NOTE — ED Provider Notes (Signed)
[facility] Emergency Department Provider Note    Event Date/Time   First MD Initiated Contact with Patient 09/13/20 1046     (approximate)  I have reviewed the triage vital signs and the nursing notes.   HISTORY  Chief Complaint Dehydration    HPI Leon SCHLAUD is a 87 y.o. male the below listed past medical history presents to the ER due to concern for dehydration.  And probably couple days ago was having diarrheal illness as well as some fever and chills.  Was tested for Covid and flu which was negative.  Denies any chest pain or congestion.  Never the past 48 hours of symptoms have resolved but was told to come to the ER yesterday because some of his blood work showed that his white count was elevated as well as his renal function was worsening.  Due to a prolonged wait patient left without being seen yesterday and came back for reevaluation today.  He denies any symptoms at this time no pain.  No nausea or vomiting.  He has a good appetite.  No fevers.    Past Medical History:  Diagnosis Date  . Asthma   . BPH (benign prostatic hypertrophy)   . Cataract   . Chronic renal disease, stage III ([place])   . Chronic sinusitis   . COPD (chronic obstructive pulmonary disease) ([place])   . Coronary artery disease    2 stents  . Gall stones   . Hearing loss    DOES NOT WEAR HEARING AIDS  . High cholesterol   . Hypertension   . Macular degeneration    right eye   Family History  Problem Relation Age of Onset  . Diabetes Father   . Heart disease Brother    Past Surgical History:  Procedure Laterality Date  . APPENDECTOMY    . CORONARY ANGIOPLASTY WITH STENT PLACEMENT  1998   2 stents  . TRANSURETHRAL RESECTION OF PROSTATE     Patient Active Problem List   Diagnosis Date Noted  . Diarrhea of presumed infectious origin 09/08/2020  . Cellulitis of right hand 07/12/2020  . Laceration of hand 07/12/2020  . Acquired claw toe, left 02/02/2020  . BPH  (benign prostatic hyperplasia) 01/13/2020  . Fever 10/22/2018  . Rash of foot 07/31/2018  . Neuropathy 02/18/2018  . COPD (chronic obstructive pulmonary disease) ([place])   . Chronic renal disease, stage III ([place])   . Benign essential HTN 04/09/2017  . CAD (coronary artery disease) 03/03/2015  . Hyperlipidemia 03/03/2015      Prior to Admission medications   Medication Sig Start Date End Date Taking? Authorizing Provider  albuterol (VENTOLIN HFA) 108 (90 Base) MCG/ACT inhaler Inhale 2 puffs into the lungs every 6 (six) hours as needed for wheezing or shortness of breath. 09/06/20   Bedsole, Amy E, MD  aspirin EC 81 MG tablet Take 81 mg by mouth daily.    [provider]  betamethasone dipropionate 0.05 % cream Apply topically 2 (two) times daily. 08/23/20   Edrick Kins, DPM  budesonide-formoterol (SYMBICORT) 160-4.5 MCG/ACT inhaler Inhale 2 puffs into the lungs daily.    [provider]  cetirizine (ZYRTEC) 10 MG tablet Take 10 mg by mouth daily.    [provider]  clindamycin (CLEOCIN) 300 MG capsule Take 1 capsule (300 mg total) by mouth 3 (three) times daily. 07/22/20   Venia Carbon, MD  doxycycline (VIBRA-TABS) 100 MG tablet Take 1 tablet (100 mg total)  by mouth 2 (two) times daily. 09/08/20   Bedsole, Amy E, MD  gabapentin (NEURONTIN) 300 MG capsule Take 300 mg by mouth at bedtime.    [provider]  gentamicin cream (GARAMYCIN) 0.1 % APPLY 1 APPLICATION TOPICALLY TWICE DAILY 08/21/20   Edrick Kins, DPM  lisinopril-hydrochlorothiazide (ZESTORETIC) 20-25 MG tablet TAKE 1 TABLET BY MOUTH ONCE DAILY 08/15/20   Venia Carbon, MD  metoprolol succinate (TOPROL-XL) 25 MG 24 hr tablet Take 1 tablet (25 mg total) by mouth daily. 08/02/20   Venia Carbon, MD  NONFORMULARY OR COMPOUNDED ITEM See pharmacy note 06/25/19   Felipa Furnace, DPM  pantoprazole (PROTONIX) 40 MG tablet TAKE 1 TABLET BY MOUTH ONCE A DAY 08/15/20   Viviana Simpler I, MD   simvastatin (ZOCOR) 40 MG tablet Take 40 mg by mouth every evening.    [provider]  tamsulosin (FLOMAX) 0.4 MG CAPS capsule TAKE 1 CAPSULE BY MOUTH ONCE DAILY 07/14/20   Venia Carbon, MD  traMADol (ULTRAM) 50 MG tablet 50 mg 2 (two) times daily as needed.  08/15/17   [provider]    Allergies Sulfa antibiotics, Neosporin [neomycin-bacitracin zn-polymyx], Augmentin [amoxicillin-pot clavulanate], and Terbinafine and related    Social History Social History   Tobacco Use  . Smoking status: Former Smoker    Types: Cigarettes    Quit date: 08/13/2005    Years since quitting: 15.0  . Smokeless tobacco: Current User    Types: Chew  . Tobacco comment: discussed stopping this (only once in a while)  Vaping Use  . Vaping Use: Never used  Substance Use Topics  . Alcohol use: No  . Drug use: No    Review of Systems Patient denies headaches, rhinorrhea, blurry vision, numbness, shortness of breath, chest pain, edema, cough, abdominal pain, nausea, vomiting, diarrhea, dysuria, fevers, rashes or hallucinations unless otherwise stated above in HPI. ____________________________________________   PHYSICAL EXAM:  VITAL SIGNS: Vitals:   09/13/20 0955 09/13/20 1232  BP: (!) 153/49 (!) 142/71  Pulse: (!) 41 80  Resp: 17 18  Temp: 97.6 F (36.4 C)   SpO2: 99% 100%    Constitutional: Alert and oriented.  Eyes: Conjunctivae are normal.  Head: Atraumatic. Nose: No congestion/rhinnorhea. Mouth/Throat: Mucous membranes are moist.   Neck: No stridor. Painless ROM.  Cardiovascular: Normal rate, regular rhythm. Grossly normal heart sounds.  Good peripheral circulation. Respiratory: Normal respiratory effort.  No retractions. Lungs CTAB. Gastrointestinal: Soft and nontender. No distention. No abdominal bruits. No CVA tenderness. Genitourinary:  Musculoskeletal: No lower extremity tenderness nor edema.  No joint effusions. Neurologic:  Normal speech and language. No  gross focal neurologic deficits are appreciated. No facial droop Skin:  Skin is warm, dry and intact. No rash noted. Psychiatric: Mood and affect are normal. Speech and behavior are normal.  ____________________________________________   LABS (all labs ordered are listed, but only abnormal results are displayed)  Results for orders placed or performed during the hospital encounter of 09/13/20 (from the past 24 hour(s))  Basic metabolic panel     Status: Abnormal   Collection Time: 09/13/20 10:00 AM  Result Value Ref Range   Sodium 140 135 - 145 mmol/L   Potassium 4.1 3.5 - 5.1 mmol/L   Chloride 105 98 - 111 mmol/L   CO2 25 22 - 32 mmol/L   Glucose, Bld 124 (H) 70 - 99 mg/dL   BUN 40 (H) 8 - 23 mg/dL   Creatinine, Ser 1.41 (H) 0.61 -  1.24 mg/dL   Calcium 9.1 8.9 - 10.3 mg/dL   GFR, Estimated 48 (L) >60 mL/min   Anion gap 10 5 - 15  CBC     Status: Abnormal   Collection Time: 09/13/20 10:00 AM  Result Value Ref Range   WBC 12.9 (H) 4.0 - 10.5 K/uL   RBC 3.73 (L) 4.22 - 5.81 MIL/uL   Hemoglobin 11.2 (L) 13.0 - 17.0 g/dL   HCT 33.7 (L) 39.0 - 52.0 %   MCV 90.3 80.0 - 100.0 fL   MCH 30.0 26.0 - 34.0 pg   MCHC 33.2 30.0 - 36.0 g/dL   RDW 14.5 11.5 - 15.5 %   Platelets 459 (H) 150 - 400 K/uL   nRBC 0.0 0.0 - 0.2 %  Urinalysis, Complete w Microscopic Urine, Unspecified Source     Status: Abnormal   Collection Time: 09/13/20 12:31 PM  Result Value Ref Range   Color, Urine YELLOW (A) YELLOW   APPearance HAZY (A) CLEAR   Specific Gravity, Urine 1.019 1.005 - 1.030   pH 5.0 5.0 - 8.0   Glucose, UA NEGATIVE NEGATIVE mg/dL   Hgb urine dipstick NEGATIVE NEGATIVE   Bilirubin Urine NEGATIVE NEGATIVE   Ketones, ur NEGATIVE NEGATIVE mg/dL   Protein, ur NEGATIVE NEGATIVE mg/dL   Nitrite NEGATIVE NEGATIVE   Leukocytes,Ua NEGATIVE NEGATIVE   RBC / HPF 6-10 0 - 5 RBC/hpf   WBC, UA 0-5 0 - 5 WBC/hpf   Bacteria, UA NONE SEEN NONE SEEN   Squamous Epithelial / LPF NONE SEEN 0 - 5   Uric  Acid Crys, UA PRESENT    ____________________________________________  EKG My review and personal interpretation at Time: 9:57   Indication: dehydration  Rate: 80  Rhythm: sinus frequent pvc Axis: normal Other: no stemi, no depressions ____________________________________________  RADIOLOGY  I personally reviewed all radiographic images ordered to evaluate for the above acute complaints and reviewed radiology reports and findings.  These findings were personally discussed with the patient.  Please see medical record for radiology report.  ____________________________________________   PROCEDURES  Procedure(s) performed:  Procedures    Critical Care performed: no ____________________________________________   INITIAL IMPRESSION / ASSESSMENT AND PLAN / ED COURSE  Pertinent labs & imaging results that were available during my care of the patient were reviewed by me and considered in my medical decision making (see chart for details).   DDX: Hydration, electrolyte abnormality, sepsis, UTI, enteritis  Leon Estes is a 87 y.o. who presents to the ED with presentation as described above.  He is afebrile hemodynamically stable and well-appearing.  He is asymptomatic.  Repeat blood work shows interval improvement in his blood work.  Patient given IV hydration.  No sign of UTI.  CT imaging ordered given recent enteritis illness his age and risk factors to make sure he did not have any signs of obstruction or diverticulitis.  Clinical Course as of 09/13/20 1317  Tue Sep 13, 2020  1154 Patient's imaging is reassuring.  Still waiting urinalysis.  Blood work is improving.  As he describes a diarrheal illness that is since resolved may have had some nonspecific enteritis. [PR]  1259 Urinalysis is reassuring.  Has his blood work is improving he is not febrile otherwise feeling well think he is appropriate for continued outpatient follow-up.  Patient and family agreeable to plan [PR]     Clinical Course User Index [PR] Merlyn Lot, MD    The patient was evaluated in Emergency Department today for the  symptoms described in the history of present illness. He/she was evaluated in the context of the global COVID-19 pandemic, which necessitated consideration that the patient might be at risk for infection with the SARS-CoV-2 virus that causes COVID-19. Institutional protocols and algorithms that pertain to the evaluation of patients at risk for COVID-19 are in a state of rapid change based on information released by regulatory bodies including the CDC and federal and state organizations. These policies and algorithms were followed during the patient's care in the ED.  As part of my medical decision making, I reviewed the following data within the [place] notes reviewed and incorporated, Labs reviewed, notes from prior ED visits and [place] Controlled Substance Database   ____________________________________________   FINAL CLINICAL IMPRESSION(S) / ED DIAGNOSES  Final diagnoses:  Dehydration      NEW MEDICATIONS STARTED DURING THIS VISIT:  New Prescriptions   No medications on file     Note:  This document was prepared using Dragon voice recognition software and may include unintentional dictation errors.    Merlyn Lot, MD 09/13/20 [phone]

## 2020-09-13 NOTE — Telephone Encounter (Signed)
Okay to add him on at 12:45 tomorrow if that slot is not already taken---or at end of schedule on Thursday

## 2020-09-13 NOTE — Telephone Encounter (Signed)
Left message for Leon Estes to call back. Please offer either appointment to them.

## 2020-09-13 NOTE — ED Triage Notes (Signed)
Pt comes into the ED via POV c/o dehydration. Pt's MD sent him here due to lab values.  Pt was here yesterday but LWBS.  Pt denies any N/V but the patient states he had diarrhea all last week.  Denies any fevers.  Pt's only complaints is weakness at this time. Pt has even and unlabored respirations.

## 2020-09-13 NOTE — Telephone Encounter (Signed)
CXR okay See what is happening today

## 2020-09-13 NOTE — Telephone Encounter (Signed)
Spoke with PCP about patient's recent care. We agree he needs to be seen in office for further evaluation given he did not stay at ER for work up/fluids. Has had negative COVID PCR test. He likely would prefer PCP to see him, Dr. Silvio Pate agrees to see him in office.

## 2020-09-13 NOTE — Telephone Encounter (Signed)
Pt was in the ER when I called earlier. I just tried to call back and the line was busy.

## 2020-09-13 NOTE — ED Triage Notes (Signed)
First Nurse Note:  Arrives for ED evaluation for possible dehydration.  Wife states patient presented to the ED yesterday for same but LWBS.  Patient is AAOx3.  Skin warm and dry. NAD

## 2020-09-13 NOTE — ED Notes (Signed)
[place] was sent to the lab. 

## 2020-09-14 NOTE — Telephone Encounter (Signed)
Did go to ER.  Labs much better  Got IV hydration and sent home  Please check on him   Spoke to pt's wife. She said he is doing better.

## 2020-09-28 ENCOUNTER — Other Ambulatory Visit: Payer: Self-pay | Admitting: Internal Medicine

## 2020-10-12 ENCOUNTER — Encounter: Payer: Self-pay | Admitting: Internal Medicine

## 2020-10-12 ENCOUNTER — Ambulatory Visit (INDEPENDENT_AMBULATORY_CARE_PROVIDER_SITE_OTHER): Payer: PPO | Admitting: Internal Medicine

## 2020-10-12 ENCOUNTER — Other Ambulatory Visit: Payer: Self-pay

## 2020-10-12 VITALS — BP 114/70 | HR 70 | Temp 97.9°F | Ht 66.5 in | Wt 156.0 lb

## 2020-10-12 DIAGNOSIS — J449 Chronic obstructive pulmonary disease, unspecified: Secondary | ICD-10-CM | POA: Diagnosis not present

## 2020-10-12 DIAGNOSIS — H353230 Exudative age-related macular degeneration, bilateral, stage unspecified: Secondary | ICD-10-CM | POA: Diagnosis not present

## 2020-10-12 DIAGNOSIS — Z0000 Encounter for general adult medical examination without abnormal findings: Secondary | ICD-10-CM | POA: Diagnosis not present

## 2020-10-12 DIAGNOSIS — N1831 Chronic kidney disease, stage 3a: Secondary | ICD-10-CM

## 2020-10-12 DIAGNOSIS — I10 Essential (primary) hypertension: Secondary | ICD-10-CM

## 2020-10-12 DIAGNOSIS — I2510 Atherosclerotic heart disease of native coronary artery without angina pectoris: Secondary | ICD-10-CM | POA: Diagnosis not present

## 2020-10-12 DIAGNOSIS — H353290 Exudative age-related macular degeneration, unspecified eye, stage unspecified: Secondary | ICD-10-CM | POA: Insufficient documentation

## 2020-10-12 DIAGNOSIS — R197 Diarrhea, unspecified: Secondary | ICD-10-CM | POA: Diagnosis not present

## 2020-10-12 NOTE — Assessment & Plan Note (Signed)
Is on lisinopril

## 2020-10-12 NOTE — Progress Notes (Signed)
Subjective:    Patient ID: Leon Estes, male    DOB: 06-15-1931, 87 y.o.   MRN: [id number]  HPI Here for Medicare wellness visit and follow up of chronic health conditions This visit occurred during the SARS-CoV-2 public health emergency.  Safety protocols were in place, including screening questions prior to the visit, additional usage of staff PPE, and extensive cleaning of exam room while observing appropriate contact time as indicated for disinfecting solutions.   Reviewed form and advanced directives Reviewed other doctors No alcohol or tobacco Doesn't exercise Vision is poor--can't drive, etc. Getting injections for macular degeneration Hearing is also poor--is considering aides 1 fall--no significant injury No depression or anhedonia Independent with instrumental ADLs No sig memory issues  Has intermittent trouble with bowels Normal for a month--then "ran like water" yesterday Just one stool though No blood Is bothered by this  No heart trouble No chest pain No dizziness or syncope--but has to take it slow upon first getting up No palpitations No headaches  Ongoing breathing problems Has symbicort--but only uses it occasionally No regular cough  Continues on gabapentin daily in AM Seems to control neuropathy  Takes protonix daily No heartburn or dysphagia  Continues tamsulosin Voids okay Nocturia is unusual  Last GFR 48  Current Outpatient Medications on File Prior to Visit  Medication Sig Dispense Refill  . albuterol (VENTOLIN HFA) 108 (90 Base) MCG/ACT inhaler Inhale 2 puffs into the lungs every 6 (six) hours as needed for wheezing or shortness of breath. 8 g 2  . aspirin EC 81 MG tablet Take 81 mg by mouth daily.    . betamethasone dipropionate 0.05 % cream Apply topically 2 (two) times daily. 45 g 1  . budesonide-formoterol (SYMBICORT) 160-4.5 MCG/ACT inhaler INHALE 2 PUFFS INTO THE LUNGS TWICE DAILY 10.2 g 5  . gabapentin (NEURONTIN) 300 MG  capsule Take 300 mg by mouth at bedtime.    Marland Kitchen gentamicin cream (GARAMYCIN) 0.1 % APPLY 1 APPLICATION TOPICALLY TWICE DAILY 30 g 1  . lisinopril-hydrochlorothiazide (ZESTORETIC) 20-25 MG tablet TAKE 1 TABLET BY MOUTH ONCE DAILY 90 tablet 3  . metoprolol succinate (TOPROL-XL) 25 MG 24 hr tablet Take 1 tablet (25 mg total) by mouth daily. 30 tablet 11  . NONFORMULARY OR COMPOUNDED ITEM See pharmacy note 120 each 2  . pantoprazole (PROTONIX) 40 MG tablet TAKE 1 TABLET BY MOUTH ONCE A DAY 90 tablet 3  . simvastatin (ZOCOR) 40 MG tablet Take 40 mg by mouth every evening.    . tamsulosin (FLOMAX) 0.4 MG CAPS capsule TAKE 1 CAPSULE BY MOUTH ONCE DAILY 90 capsule 3  . traMADol (ULTRAM) 50 MG tablet 50 mg 2 (two) times daily as needed.      No current facility-administered medications on file prior to visit.    Allergies  Allergen Reactions  . Sulfa Antibiotics Rash  . Neosporin [Neomycin-Bacitracin Zn-Polymyx]   . Augmentin [Amoxicillin-Pot Clavulanate] Rash  . Terbinafine And Related Rash    Past Medical History:  Diagnosis Date  . Asthma   . BPH (benign prostatic hypertrophy)   . Cataract   . Chronic renal disease, stage III ([place])   . Chronic sinusitis   . COPD (chronic obstructive pulmonary disease) ([place])   . Coronary artery disease    2 stents  . Gall stones   . Hearing loss    DOES NOT WEAR HEARING AIDS  . High cholesterol   . Hypertension   . Macular degeneration    right eye  Past Surgical History:  Procedure Laterality Date  . APPENDECTOMY    . CORONARY ANGIOPLASTY WITH STENT PLACEMENT  1998   2 stents  . TRANSURETHRAL RESECTION OF PROSTATE      Family History  Problem Relation Age of Onset  . Diabetes Father   . Heart disease Brother     Social History   Socioeconomic History  . Marital status: Married    Spouse name: Not on file  . Number of children: 4  . Years of education: Not on file  . Highest education level: Not on file  Occupational History  .  Occupation: Drove truck and concrete work  Tobacco Use  . Smoking status: Former Smoker    Types: Cigarettes    Quit date: 08/13/2005    Years since quitting: 15.1  . Smokeless tobacco: Current User    Types: Chew  . Tobacco comment: discussed stopping this (only once in a while)  Vaping Use  . Vaping Use: Never used  Substance and Sexual Activity  . Alcohol use: No  . Drug use: No  . Sexual activity: Not on file  Other Topics Concern  . Not on file  Social History Narrative   No living will   Wife, then [place] daughter should make decisions   Would accept resuscitation   Would probably accept tube feeds   Social Determinants of Health   Financial Resource Strain: Not on file  Food Insecurity: Not on file  Transportation Needs: Not on file  Physical Activity: Not on file  Stress: Not on file  Social Connections: Not on file  Intimate Partner Violence: Not on file   Review of Systems Sleeps okay Appetite is "off and on" Has lost a few pounds Wears seat belt Edentulous ---doesn't have dentures Toes get uncomfortable No sig back or joint pains No skin concerns--doesn't see derm    Objective:   Physical Exam Constitutional:      Appearance: Normal appearance.     Comments: Very HOH!!  HENT:     Mouth/Throat:     Comments: Edentulous ---no lesions Eyes:     Conjunctiva/sclera: Conjunctivae normal.     Pupils: Pupils are equal, round, and reactive to light.  Cardiovascular:     Rate and Rhythm: Normal rate and regular rhythm.     Pulses: Normal pulses.     Heart sounds: No murmur heard. No gallop.   Pulmonary:     Effort: Pulmonary effort is normal.     Breath sounds: No wheezing or rales.     Comments: Decreased breath sounds but clear Abdominal:     Palpations: Abdomen is soft.     Tenderness: There is no abdominal tenderness.  Musculoskeletal:     Cervical back: Neck supple.     Right lower leg: No edema.     Left lower leg: No edema.  Lymphadenopathy:      Cervical: No cervical adenopathy.  Skin:    General: Skin is warm.     Findings: No rash.  Neurological:     Mental Status: He is alert and oriented to person, place, and time.     Comments: President--- "Joe ----, Trump, ?" [phone] D- "I can't do that" Recall 3/3  Psychiatric:        Mood and Affect: Mood normal.        Behavior: Behavior normal.            Assessment & Plan:

## 2020-10-12 NOTE — Assessment & Plan Note (Signed)
He is vague about history but very concerned about this Has unintended weight loss Mild persistent anemia (from CKD?) Will set up with GI

## 2020-10-12 NOTE — Assessment & Plan Note (Signed)
Has symbicort but not using regularly

## 2020-10-12 NOTE — Progress Notes (Signed)
Hearing Screening   125Hz  250Hz  500Hz  1000Hz  2000Hz  3000Hz  4000Hz  6000Hz  8000Hz   Right ear:           Left ear:           Comments: Aware of hearing issues. Does not have hearing aids.  Vision Screening Comments: Stated he could not see any of the letters

## 2020-10-12 NOTE — Assessment & Plan Note (Signed)
Controlled with lisinopril/HCTZ and metoprolol

## 2020-10-12 NOTE — Assessment & Plan Note (Signed)
I have personally reviewed the Medicare Annual Wellness questionnaire and have noted 1. The patient's medical and social history 2. Their use of alcohol, tobacco or illicit drugs 3. Their current medications and supplements 4. The patient's functional ability including ADL's, fall risks, home safety risks and hearing or visual             impairment. 5. Diet and physical activities 6. Evidence for depression or mood disorders  The patients weight, height, BMI and visual acuity have been recorded in the chart I have made referrals, counseling and provided education to the patient based review of the above and I have provided the pt with a written personalized care plan for preventive services.  I have provided you with a copy of your personalized plan for preventive services. Please take the time to review along with your updated medication list.  No cancer screening due to age Yearly flu vaccine Desperately needs hearing aides---he has but needs batteries?? Stay active COVID booster

## 2020-10-12 NOTE — Assessment & Plan Note (Signed)
Has lost substantial vision despite injection therapy

## 2020-10-12 NOTE — Assessment & Plan Note (Addendum)
No current symptoms on statin, ACEI, beta blocker and ASA

## 2020-10-12 NOTE — Patient Instructions (Addendum)
Please take the protonix (pantoprazole) on an empty stomach (either well before a meal or at bedtime)  You need to get your hearing aides working!!!!

## 2020-10-17 ENCOUNTER — Encounter: Payer: Self-pay | Admitting: *Deleted

## 2020-11-26 ENCOUNTER — Emergency Department: Payer: PPO

## 2020-11-26 ENCOUNTER — Inpatient Hospital Stay
Admission: EM | Admit: 2020-11-26 | Discharge: 2020-11-29 | DRG: 444 | Disposition: A | Discharge status: Home / Self Care | Payer: PPO | Attending: Internal Medicine | Admitting: Internal Medicine

## 2020-11-26 ENCOUNTER — Other Ambulatory Visit: Payer: Self-pay

## 2020-11-26 DIAGNOSIS — Z883 Allergy status to other anti-infective agents status: Secondary | ICD-10-CM

## 2020-11-26 DIAGNOSIS — Z9079 Acquired absence of other genital organ(s): Secondary | ICD-10-CM | POA: Diagnosis not present

## 2020-11-26 DIAGNOSIS — N183 Chronic kidney disease, stage 3 unspecified: Secondary | ICD-10-CM | POA: Diagnosis not present

## 2020-11-26 DIAGNOSIS — N400 Enlarged prostate without lower urinary tract symptoms: Secondary | ICD-10-CM | POA: Diagnosis not present

## 2020-11-26 DIAGNOSIS — K219 Gastro-esophageal reflux disease without esophagitis: Secondary | ICD-10-CM | POA: Diagnosis present

## 2020-11-26 DIAGNOSIS — I2510 Atherosclerotic heart disease of native coronary artery without angina pectoris: Secondary | ICD-10-CM | POA: Diagnosis not present

## 2020-11-26 DIAGNOSIS — Z7951 Long term (current) use of inhaled steroids: Secondary | ICD-10-CM | POA: Diagnosis not present

## 2020-11-26 DIAGNOSIS — J9601 Acute respiratory failure with hypoxia: Secondary | ICD-10-CM | POA: Diagnosis present

## 2020-11-26 DIAGNOSIS — K8000 Calculus of gallbladder with acute cholecystitis without obstruction: Secondary | ICD-10-CM

## 2020-11-26 DIAGNOSIS — Z955 Presence of coronary angioplasty implant and graft: Secondary | ICD-10-CM | POA: Diagnosis not present

## 2020-11-26 DIAGNOSIS — Z881 Allergy status to other antibiotic agents status: Secondary | ICD-10-CM

## 2020-11-26 DIAGNOSIS — J449 Chronic obstructive pulmonary disease, unspecified: Secondary | ICD-10-CM | POA: Diagnosis not present

## 2020-11-26 DIAGNOSIS — H353210 Exudative age-related macular degeneration, right eye, stage unspecified: Secondary | ICD-10-CM | POA: Diagnosis not present

## 2020-11-26 DIAGNOSIS — G4710 Hypersomnia, unspecified: Secondary | ICD-10-CM | POA: Diagnosis present

## 2020-11-26 DIAGNOSIS — K810 Acute cholecystitis: Principal | ICD-10-CM

## 2020-11-26 DIAGNOSIS — K8020 Calculus of gallbladder without cholecystitis without obstruction: Secondary | ICD-10-CM | POA: Diagnosis not present

## 2020-11-26 DIAGNOSIS — J329 Chronic sinusitis, unspecified: Secondary | ICD-10-CM | POA: Diagnosis not present

## 2020-11-26 DIAGNOSIS — I129 Hypertensive chronic kidney disease with stage 1 through stage 4 chronic kidney disease, or unspecified chronic kidney disease: Secondary | ICD-10-CM | POA: Diagnosis present

## 2020-11-26 DIAGNOSIS — R1013 Epigastric pain: Secondary | ICD-10-CM | POA: Diagnosis not present

## 2020-11-26 DIAGNOSIS — Z20822 Contact with and (suspected) exposure to covid-19: Secondary | ICD-10-CM | POA: Diagnosis present

## 2020-11-26 DIAGNOSIS — I10 Essential (primary) hypertension: Secondary | ICD-10-CM | POA: Diagnosis not present

## 2020-11-26 DIAGNOSIS — M47817 Spondylosis without myelopathy or radiculopathy, lumbosacral region: Secondary | ICD-10-CM | POA: Diagnosis not present

## 2020-11-26 DIAGNOSIS — Z7982 Long term (current) use of aspirin: Secondary | ICD-10-CM | POA: Diagnosis not present

## 2020-11-26 DIAGNOSIS — R0902 Hypoxemia: Secondary | ICD-10-CM

## 2020-11-26 DIAGNOSIS — G629 Polyneuropathy, unspecified: Secondary | ICD-10-CM | POA: Diagnosis present

## 2020-11-26 DIAGNOSIS — E785 Hyperlipidemia, unspecified: Secondary | ICD-10-CM | POA: Diagnosis present

## 2020-11-26 DIAGNOSIS — E669 Obesity, unspecified: Secondary | ICD-10-CM | POA: Diagnosis not present

## 2020-11-26 DIAGNOSIS — H9190 Unspecified hearing loss, unspecified ear: Secondary | ICD-10-CM | POA: Diagnosis present

## 2020-11-26 DIAGNOSIS — Z79891 Long term (current) use of opiate analgesic: Secondary | ICD-10-CM

## 2020-11-26 DIAGNOSIS — G9341 Metabolic encephalopathy: Secondary | ICD-10-CM | POA: Diagnosis present

## 2020-11-26 DIAGNOSIS — Z79899 Other long term (current) drug therapy: Secondary | ICD-10-CM | POA: Diagnosis not present

## 2020-11-26 DIAGNOSIS — M4317 Spondylolisthesis, lumbosacral region: Secondary | ICD-10-CM | POA: Diagnosis not present

## 2020-11-26 DIAGNOSIS — Z882 Allergy status to sulfonamides status: Secondary | ICD-10-CM

## 2020-11-26 DIAGNOSIS — K5790 Diverticulosis of intestine, part unspecified, without perforation or abscess without bleeding: Secondary | ICD-10-CM | POA: Diagnosis not present

## 2020-11-26 DIAGNOSIS — Z6825 Body mass index (BMI) 25.0-25.9, adult: Secondary | ICD-10-CM

## 2020-11-26 DIAGNOSIS — Z8249 Family history of ischemic heart disease and other diseases of the circulatory system: Secondary | ICD-10-CM

## 2020-11-26 DIAGNOSIS — Z720 Tobacco use: Secondary | ICD-10-CM

## 2020-11-26 LAB — URINALYSIS, COMPLETE (UACMP) WITH MICROSCOPIC
Bacteria, UA: NONE SEEN
Bilirubin Urine: NEGATIVE
Glucose, UA: 50 mg/dL — AB
Ketones, ur: 5 mg/dL — AB
Leukocytes,Ua: NEGATIVE
Nitrite: NEGATIVE
Protein, ur: 100 mg/dL — AB
Specific Gravity, Urine: 1.039 — ABNORMAL HIGH (ref 1.005–1.030)
pH: 6 (ref 5.0–8.0)

## 2020-11-26 LAB — LIPASE, BLOOD: Lipase: 28 U/L (ref 11–51)

## 2020-11-26 LAB — COMPREHENSIVE METABOLIC PANEL
ALT: 21 U/L (ref 0–44)
AST: 26 U/L (ref 15–41)
Albumin: 4 g/dL (ref 3.5–5.0)
Alkaline Phosphatase: 65 U/L (ref 38–126)
Anion gap: 7 (ref 5–15)
BUN: 20 mg/dL (ref 8–23)
CO2: 28 mmol/L (ref 22–32)
Calcium: 8.9 mg/dL (ref 8.9–10.3)
Chloride: 100 mmol/L (ref 98–111)
Creatinine, Ser: 1.17 mg/dL (ref 0.61–1.24)
GFR, Estimated: >60 mL/min (ref >60)
Glucose, Bld: 122 mg/dL — ABNORMAL HIGH (ref 70–99)
Potassium: 4.4 mmol/L (ref 3.5–5.1)
Sodium: 135 mmol/L (ref 135–145)
Total Bilirubin: 0.8 mg/dL (ref 0.3–1.2)
Total Protein: 7.5 g/dL (ref 6.5–8.1)

## 2020-11-26 LAB — CBC
HCT: 36.4 % — ABNORMAL LOW (ref 39.0–52.0)
Hemoglobin: 11.4 g/dL — ABNORMAL LOW (ref 13.0–17.0)
MCH: 28.4 pg (ref 26.0–34.0)
MCHC: 31.3 g/dL (ref 30.0–36.0)
MCV: 90.5 fL (ref 80.0–100.0)
Platelets: 266 10*3/uL (ref 150–400)
RBC: 4.02 MIL/uL — ABNORMAL LOW (ref 4.22–5.81)
RDW: 13.9 % (ref 11.5–15.5)
WBC: 16 10*3/uL — ABNORMAL HIGH (ref 4.0–10.5)
nRBC: 0 % (ref 0.0–0.2)

## 2020-11-26 LAB — LACTIC ACID, PLASMA
Lactic Acid, Venous: 1.3 mmol/L (ref 0.5–1.9)
Lactic Acid, Venous: 1.6 mmol/L (ref 0.5–1.9)

## 2020-11-26 LAB — RESP PANEL BY RT-PCR (FLU A&B, COVID) ARPGX2
Influenza A by PCR: NEGATIVE
Influenza B by PCR: NEGATIVE
SARS Coronavirus 2 by RT PCR: NEGATIVE

## 2020-11-26 MED ORDER — IOHEXOL 300 MG/ML  SOLN
100.0000 mL | Freq: Once | INTRAMUSCULAR | Status: AC | PRN
  Administered 2020-11-26: 100 mL via INTRAVENOUS

## 2020-11-26 MED ORDER — HYDROMORPHONE HCL 1 MG/ML IJ SOLN
0.5000 mg | Freq: Once | INTRAMUSCULAR | Status: AC
Start: 2020-11-26 — End: 2020-11-26
  Administered 2020-11-26: 0.5 mg via INTRAVENOUS
  Filled 2020-11-26: qty 1

## 2020-11-26 MED ORDER — ONDANSETRON HCL 4 MG/2ML IJ SOLN: 4.0000 mg | Freq: Four times a day (QID) | INTRAMUSCULAR | Status: DC | PRN

## 2020-11-26 MED ORDER — METOPROLOL TARTRATE 5 MG/5ML IV SOLN: 2.5000 mg | INTRAVENOUS | Status: DC | PRN

## 2020-11-26 MED ORDER — MORPHINE SULFATE (PF) 4 MG/ML IV SOLN
4.0000 mg | Freq: Once | INTRAVENOUS | Status: AC
  Administered 2020-11-26: 4 mg via INTRAVENOUS
  Filled 2020-11-26: qty 1

## 2020-11-26 MED ORDER — ALBUTEROL SULFATE HFA 108 (90 BASE) MCG/ACT IN AERS
2.0000 | INHALATION_SPRAY | Freq: Four times a day (QID) | RESPIRATORY_TRACT | Status: DC | PRN
  Filled 2020-11-26 (×2): qty 6.7

## 2020-11-26 MED ORDER — ONDANSETRON HCL 4 MG/2ML IJ SOLN
4.0000 mg | Freq: Once | INTRAMUSCULAR | Status: AC
  Administered 2020-11-26: 4 mg via INTRAVENOUS
  Filled 2020-11-26: qty 2

## 2020-11-26 MED ORDER — TAMSULOSIN HCL 0.4 MG PO CAPS
0.4000 mg | ORAL_CAPSULE | Freq: Every day | ORAL | Status: DC
  Administered 2020-11-27 – 2020-11-29 (×3): 0.4 mg via ORAL
  Filled 2020-11-26 (×3): qty 1

## 2020-11-26 MED ORDER — KETOROLAC TROMETHAMINE 15 MG/ML IJ SOLN
15.0000 mg | Freq: Three times a day (TID) | INTRAMUSCULAR | Status: AC | PRN
Start: 2020-11-26 — End: 2020-11-27

## 2020-11-26 MED ORDER — METOPROLOL SUCCINATE ER 50 MG PO TB24
25.0000 mg | ORAL_TABLET | Freq: Every day | ORAL | Status: DC
  Administered 2020-11-27 – 2020-11-29 (×3): 25 mg via ORAL
  Filled 2020-11-26 (×3): qty 1

## 2020-11-26 MED ORDER — SIMVASTATIN 40 MG PO TABS
40.0000 mg | ORAL_TABLET | Freq: Every evening | ORAL | Status: DC
  Administered 2020-11-27 – 2020-11-28 (×2): 40 mg via ORAL
  Filled 2020-11-26 (×2): qty 4
  Filled 2020-11-26: qty 1
  Filled 2020-11-26: qty 4

## 2020-11-26 MED ORDER — MOMETASONE FURO-FORMOTEROL FUM 200-5 MCG/ACT IN AERO: 2.0000 | INHALATION_SPRAY | Freq: Two times a day (BID) | RESPIRATORY_TRACT | Status: DC

## 2020-11-26 MED ORDER — MOMETASONE FURO-FORMOTEROL FUM 200-5 MCG/ACT IN AERO
2.0000 | INHALATION_SPRAY | Freq: Two times a day (BID) | RESPIRATORY_TRACT | Status: DC
  Administered 2020-11-27 – 2020-11-29 (×5): 2 via RESPIRATORY_TRACT
  Filled 2020-11-26: qty 8.8

## 2020-11-26 MED ORDER — PANTOPRAZOLE SODIUM 40 MG PO TBEC
40.0000 mg | DELAYED_RELEASE_TABLET | Freq: Every day | ORAL | Status: DC
  Administered 2020-11-26 – 2020-11-29 (×4): 40 mg via ORAL
  Filled 2020-11-26 (×4): qty 1

## 2020-11-26 MED ORDER — ASPIRIN EC 81 MG PO TBEC
81.0000 mg | DELAYED_RELEASE_TABLET | Freq: Every day | ORAL | Status: DC
  Administered 2020-11-27 – 2020-11-29 (×3): 81 mg via ORAL
  Filled 2020-11-26 (×3): qty 1

## 2020-11-26 MED ORDER — METRONIDAZOLE IN NACL 5-0.79 MG/ML-% IV SOLN
500.0000 mg | Freq: Once | INTRAVENOUS | Status: AC
  Administered 2020-11-26: 500 mg via INTRAVENOUS
  Filled 2020-11-26: qty 100

## 2020-11-26 MED ORDER — ENOXAPARIN SODIUM 40 MG/0.4ML ~~LOC~~ SOLN
40.0000 mg | SUBCUTANEOUS | Status: DC
  Administered 2020-11-26 – 2020-11-28 (×3): 40 mg via SUBCUTANEOUS
  Filled 2020-11-26 (×3): qty 0.4

## 2020-11-26 MED ORDER — METRONIDAZOLE IN NACL 5-0.79 MG/ML-% IV SOLN
500.0000 mg | Freq: Three times a day (TID) | INTRAVENOUS | Status: DC
  Administered 2020-11-27 – 2020-11-29 (×7): 500 mg via INTRAVENOUS
  Filled 2020-11-26 (×10): qty 100

## 2020-11-26 MED ORDER — ACETAMINOPHEN 325 MG PO TABS
650.0000 mg | ORAL_TABLET | Freq: Four times a day (QID) | ORAL | Status: DC | PRN
  Administered 2020-11-27: 650 mg via ORAL
  Filled 2020-11-26: qty 2

## 2020-11-26 MED ORDER — LACTATED RINGERS IV SOLN: INTRAVENOUS | Status: AC

## 2020-11-26 MED ORDER — HYDROMORPHONE HCL 1 MG/ML IJ SOLN
0.5000 mg | Freq: Once | INTRAMUSCULAR | Status: AC
  Administered 2020-11-26: 0.5 mg via INTRAVENOUS
  Filled 2020-11-26: qty 1

## 2020-11-26 MED ORDER — FAMOTIDINE IN NACL 20-0.9 MG/50ML-% IV SOLN
20.0000 mg | Freq: Once | INTRAVENOUS | Status: AC
  Administered 2020-11-26: 20 mg via INTRAVENOUS
  Filled 2020-11-26: qty 50

## 2020-11-26 MED ORDER — KETOROLAC TROMETHAMINE 30 MG/ML IJ SOLN
30.0000 mg | Freq: Once | INTRAMUSCULAR | Status: AC
  Administered 2020-11-26: 30 mg via INTRAVENOUS
  Filled 2020-11-26: qty 1

## 2020-11-26 MED ORDER — SODIUM CHLORIDE 0.9 % IV BOLUS
500.0000 mL | Freq: Once | INTRAVENOUS | Status: AC
  Administered 2020-11-26: 500 mL via INTRAVENOUS

## 2020-11-26 MED ORDER — SODIUM CHLORIDE 0.9 % IV SOLN
2.0000 g | Freq: Three times a day (TID) | INTRAVENOUS | Status: DC
  Administered 2020-11-26: 2 g via INTRAVENOUS
  Filled 2020-11-26: qty 2

## 2020-11-26 MED ORDER — TRAMADOL HCL 50 MG PO TABS
50.0000 mg | ORAL_TABLET | Freq: Four times a day (QID) | ORAL | Status: DC | PRN
  Administered 2020-11-27: 50 mg via ORAL
  Filled 2020-11-26: qty 1

## 2020-11-26 MED ORDER — SODIUM CHLORIDE 0.9 % IV SOLN
2.0000 g | INTRAVENOUS | Status: DC
  Administered 2020-11-27: 2 g via INTRAVENOUS
  Filled 2020-11-26 (×2): qty 2

## 2020-11-26 NOTE — ED Notes (Signed)
Lab called to obtain admitting labs.

## 2020-11-26 NOTE — H&P (Signed)
History and Physical  Leon Estes HWE:[id number] DOB: 10-24-1930 DOA: 11/26/2020  Referring physician: Dr. Cherylann Banas, [place] PCP: Venia Carbon, MD  Outpatient Specialists: Podiatry, cardiology. Patient coming from: Home.  Chief Complaint: Right upper quadrant abdominal pain.  HPI: Leon Estes is a 87 y.o. male with medical history significant for coronary artery disease, COPD, essential hypertension, polyneuropathy, hyperlipidemia, BPH who presented to [facility] ED with complaints of sudden onset right upper quadrant abdominal pain that started early this morning around 5 AM, several hours after eating dinner last night.  Associated with nausea but no vomiting.  No diarrhea, no bowel movement on the day of presentation.  Had a similar episode a few months ago.  Per EDP, has denied any cardiopulmonary symptoms.  History is obtained from EDP and from review of medical records.  At the time of this visit patient is confused and hypersomnolent.  He is arousable to voices but goes right back to sleep.  Unable to obtain a history from the patient.  Called his wife x2, unable to reach his wife.  Right upper quadrant abdominal ultrasound and CT abdomen and pelvis with contrast were equivocal.  EDP consulted general surgery due to suspected acute cholecystitis.  Started on broad-spectrum IV antibiotics empirically.  TRH asked to admit.  ED Course:  Afebrile.  BP 170/72, heart rate 90, respiratory rate 26, O2 saturation 99% on 2 L.  Lab studies remarkable for lipase normal 28.  Liver chemistries normal.  WBC 16,000, hemoglobin 11.4K, platelet count 266K.  Review of Systems: Review of systems as noted in the HPI. All other systems reviewed and are negative.   Past Medical History:  Diagnosis Date  . Asthma   . BPH (benign prostatic hypertrophy)   . Cataract   . Chronic renal disease, stage III ([place])   . Chronic sinusitis   . COPD (chronic obstructive pulmonary disease) ([place])   . Coronary artery  disease    2 stents  . Gall stones   . Hearing loss    DOES NOT WEAR HEARING AIDS  . High cholesterol   . Hypertension   . Macular degeneration    right eye   Past Surgical History:  Procedure Laterality Date  . APPENDECTOMY    . CORONARY ANGIOPLASTY WITH STENT PLACEMENT  1998   2 stents  . TRANSURETHRAL RESECTION OF PROSTATE      Social History:  reports that he quit smoking about 15 years ago. His smoking use included cigarettes. His smokeless tobacco use includes chew. He reports that he does not drink alcohol and does not use drugs.   Allergies  Allergen Reactions  . Sulfa Antibiotics Rash  . Neosporin [Neomycin-Bacitracin Zn-Polymyx]   . Augmentin [Amoxicillin-Pot Clavulanate] Rash  . Terbinafine And Related Rash    Family History  Problem Relation Age of Onset  . Diabetes Father   . Heart disease Brother       Prior to Admission medications   Medication Sig Start Date End Date Taking? Authorizing Provider  albuterol (VENTOLIN HFA) 108 (90 Base) MCG/ACT inhaler Inhale 2 puffs into the lungs every 6 (six) hours as needed for wheezing or shortness of breath. 09/06/20   Bedsole, Amy E, MD  aspirin EC 81 MG tablet Take 81 mg by mouth daily.    [provider]  betamethasone dipropionate 0.05 % cream Apply topically 2 (two) times daily. 08/23/20   Edrick Kins, DPM  budesonide-formoterol (SYMBICORT) 160-4.5 MCG/ACT inhaler INHALE 2 PUFFS INTO THE LUNGS  TWICE DAILY 09/28/20   Venia Carbon, MD  gabapentin (NEURONTIN) 300 MG capsule Take 300 mg by mouth at bedtime.    [provider]  gentamicin cream (GARAMYCIN) 0.1 % APPLY 1 APPLICATION TOPICALLY TWICE DAILY 08/21/20   Edrick Kins, DPM  lisinopril-hydrochlorothiazide (ZESTORETIC) 20-25 MG tablet TAKE 1 TABLET BY MOUTH ONCE DAILY 08/15/20   Venia Carbon, MD  metoprolol succinate (TOPROL-XL) 25 MG 24 hr tablet Take 1 tablet (25 mg total) by mouth daily. 08/02/20   Venia Carbon, MD   NONFORMULARY OR COMPOUNDED ITEM See pharmacy note 06/25/19   Felipa Furnace, DPM  pantoprazole (PROTONIX) 40 MG tablet TAKE 1 TABLET BY MOUTH ONCE A DAY 08/15/20   Viviana Simpler I, MD  simvastatin (ZOCOR) 40 MG tablet Take 40 mg by mouth every evening.    [provider]  tamsulosin (FLOMAX) 0.4 MG CAPS capsule TAKE 1 CAPSULE BY MOUTH ONCE DAILY 07/14/20   Venia Carbon, MD  traMADol (ULTRAM) 50 MG tablet 50 mg 2 (two) times daily as needed.  08/15/17   [provider]    Physical Exam: BP (!) 160/95   Pulse 83   Temp 98.5 F (36.9 C) (Oral)   Resp 18   Ht 5\' 7"  (1.702 m)   Wt 72.6 kg   SpO2 95%   BMI 25.06 kg/m   . General: 87 y.o. year-old male well developed well nourished in no acute distress.  Hypersomnolent but arousable to voices. . Cardiovascular: Regular rate and rhythm with no rubs or gallops.  No thyromegaly or JVD noted.  No lower extremity edema. 2/4 pulses in all 4 extremities. Marland Kitchen Respiratory: Clear to auscultation with no wheezes or rales. Good inspiratory effort. . Abdomen: Obese, tenderness noted with palpation of right upper quadrant.  Bowel sounds present.   . Muskuloskeletal: No cyanosis, clubbing or edema noted bilaterally . Neuro: CN II-XII intact, strength, sensation, reflexes . Skin: No ulcerative lesions noted or rashes . Psychiatry: Judgement and insight appear altered.  Unable to assess mood due to somnolence.         Labs on Admission:  Basic Metabolic Panel: Recent Labs  Lab 11/26/20 1513  NA 135  K 4.4  CL 100  CO2 28  GLUCOSE 122*  BUN 20  CREATININE 1.17  CALCIUM 8.9   Liver Function Tests: Recent Labs  Lab 11/26/20 1513  AST 26  ALT 21  ALKPHOS 65  BILITOT 0.8  PROT 7.5  ALBUMIN 4.0   Recent Labs  Lab 11/26/20 1513  LIPASE 28   No results for input(s): AMMONIA in the last 168 hours. CBC: Recent Labs  Lab 11/26/20 1513  WBC 16.0*  HGB 11.4*  HCT 36.4*  MCV 90.5  PLT 266   Cardiac Enzymes: No  results for input(s): CKTOTAL, CKMB, CKMBINDEX, TROPONINI in the last 168 hours.  BNP (last 3 results) No results for input(s): BNP in the last 8760 hours.  ProBNP (last 3 results) No results for input(s): PROBNP in the last 8760 hours.  CBG: No results for input(s): GLUCAP in the last 168 hours.  Radiological Exams on Admission: CT ABDOMEN PELVIS W CONTRAST  Result Date: 11/26/2020 CLINICAL DATA:  Epigastric pain. EXAM: CT ABDOMEN AND PELVIS WITH CONTRAST TECHNIQUE: Multidetector CT imaging of the abdomen and pelvis was performed using the standard protocol following bolus administration of intravenous contrast. CONTRAST:  1109mL OMNIPAQUE IOHEXOL 300 MG/ML  SOLN COMPARISON:  CT abdomen pelvis 09/13/2020, CT abdomen pelvis 05/04/2013  FINDINGS: Lower chest: Bilateral lower lobe subsegmental atelectasis. 8 mm nodular-like triangular subpleural density within the lingula. Hepatobiliary: No focal liver abnormality. No gallstones. Question borderline thickened gallbladder wall thickening and trace pericholecystic fluid. No biliary dilatation. Pancreas: No focal lesion. Normal pancreatic contour. No surrounding inflammatory changes. No main pancreatic ductal dilatation. Spleen: Normal in size without focal abnormality. Adrenals/Urinary Tract: No adrenal nodule bilaterally. Bilateral kidneys enhance symmetrically. 2.7 cm fluid density lesion within left kidney likely represents a simple renal cyst. No hydronephrosis. No hydroureter. The urinary bladder is unremarkable. Stomach/Bowel: Stomach is within normal limits. Second portion of the duodenum diverticula again noted. No evidence of bowel wall thickening or dilatation. Appendix appears normal. Vascular/Lymphatic: The portal, splenic, superior mesenteric veins are patent. No abdominal aorta or iliac aneurysm. Severe atherosclerotic plaque of the aorta and its branches. No abdominal, pelvic, or inguinal lymphadenopathy. Reproductive: The prostate is enlarged  measuring up to 6.4 cm. Other: No large volume intraperitoneal free fluid. No intraperitoneal free gas. No organized fluid collection. Musculoskeletal: No abdominal wall hernia or abnormality. No suspicious lytic or blastic osseous lesions. No acute displaced fracture. Multilevel degenerative changes of the spine with intervertebral disc space vacuum phenomenon endplate sclerosis at the L4-L5 and L5-S1 levels. Grade 1-2 anterolisthesis of L5 on S1. IMPRESSION: 1. Question borderline thickened gallbladder wall thickening and trace pericholecystic fluid. Recommend right upper quadrant ultrasound for a more sensitive evaluation of the gallbladder. 2. Prostatomegaly. 3. Severe degenerative changes of the L4 through S1 levels with grade 1-2 anterolisthesis of L5 on S1. 4.  Aortic Atherosclerosis (ICD10-I70.0). 5. An 8 mm nodular-like subpleural triangular density within the lingula that likely represents an intrapulmonary lymph node. Non-contrast chest CT at 6-12 months is recommended. If the nodule is stable at time of repeat CT, then future CT at 18-24 months (from today's scan) is considered optional for low-risk patients, but is recommended for high-risk patients. This recommendation follows the consensus statement: Guidelines for Management of Incidental Pulmonary Nodules Detected on CT Images: From the Fleischner Society 2017; Radiology 2017; 284:228-243. Electronically Signed   By: Iven Finn M.D.   On: 11/26/2020 17:03   US Abdomen Limited RUQ (LIVER/GB)  Result Date: 11/26/2020 CLINICAL DATA:  Epigastric pain for 1 day. EXAM: ULTRASOUND ABDOMEN LIMITED RIGHT UPPER QUADRANT COMPARISON:  None. FINDINGS: Gallbladder: The gallbladder is contracted which limits evaluation. No gallbladder wall thickening. There is a calculus measuring 0.9 cm. Negative sonographic Murphy sign. Common bile duct: Diameter: 0.6 cm, within normal limits. Liver: No focal lesion identified. Within normal limits in parenchymal  echogenicity. Portal vein is patent on color Doppler imaging with normal direction of blood flow towards the liver. Other: None. IMPRESSION: The gallbladder is contracted which limits evaluation. Cholelithiasis without other evidence of acute cholecystitis. Electronically Signed   By: Audie Pinto M.D.   On: 11/26/2020 18:17    EKG: I independently viewed the EKG done and my findings are as followed: Sinus rhythm rate of 73.  Nonspecific ST-T changes.  QTc 454.  Assessment/Plan Present on Admission: . Acute cholecystitis  Active Problems:   Acute cholecystitis  Presumed acute cholecystitis Presented with sudden onset right upper quadrant abdominal pain associated with nausea.  Had similar episode months ago. Right upper quadrant ultrasound and CT scan abdomen and pelvis with contrast were equivocal. Obtain HIDA scan General surgery has been consulted Continue empiric IV antibiotics started in the ED Follow blood cultures  Acute metabolic encephalopathy, unclear etiology Hypersomnolent at the time of this visit.  Received IV Dilaudid x1 mg due to severe pain in the ED, unclear if his altered mental status is related to the use of opiates Closely monitor  Acute hypoxic respiratory failure, unclear etiology. Unclear etiology in the setting of hypersomnolence O2 saturation 86% on room air, 99% on 2 L. Not on oxygen supplementation at baseline Incentive spirometer when more alert. Wean off oxygen supplementation as tolerated.  Coronary artery disease Resume home aspirin and simvastatin. No reported chest pain.  Essential hypertension BP is not at goal Resume home oral antihypertensives if able to swallow safely IV Lopressor as needed with parameters.  Polyneuropathy Hold off home gabapentin for now due to hypersomnolence.  Hyperlipidemia Resume home simvastatin LFTs are normal  GERD Resume home PPI, Protonix 40 mg daily.  BPH Resume home Flomax Monitor urine  output.  COPD Not on oxygen supplementation at baseline. Resume home regimen.     DVT prophylaxis: Subcu Lovenox daily.  Code Status: Full code for now.  Unable to obtain CODE STATUS from the patient due to altered mental status.  Unable to reach the patient's wife.  Please reassess CODE STATUS in the morning.  Family Communication: Unable to reach wife via phone.  Disposition Plan: Admit to MedSurg unit with remote telemetry.  Consults called: General surgery consulted by EDP.  Admission status: Inpatient status.   Status is: Inpatient    Dispo: The patient is from: Home.              Anticipated d/c is to: Home.               Patient currently not stable for discharge due to ongoing management of presumed acute cholecystitis.   Difficult to place patient, not applicable.       Kayleen Memos MD [facility] Pager [phone]  If 7PM-7AM, please contact night-coverage [email or web address] Password [facility]  11/26/2020, 7:55 PM

## 2020-11-26 NOTE — ED Notes (Signed)
Pt to CT at this time.

## 2020-11-26 NOTE — ED Provider Notes (Signed)
[facility] Emergency Department Provider Note ____________________________________________   Event Date/Time   First MD Initiated Contact with Patient 11/26/20 1516     (approximate)  I have reviewed the triage vital signs and the nursing notes.   HISTORY  Chief Complaint Abdominal Pain    HPI Leon Estes is a 87 y.o. male with PMH as noted below including CAD, COPD, hypertension, and asthma who presents with epigastric pain since around 5 AM today, constant since then, associated with nausea but no vomiting.  He denies any diarrhea and has not had a bowel movement today.  He states he had some fish for dinner last night but the pain did not start until several hours later.  He had 1 similar episode a few months ago.  He denies any chest pain or shortness of breath.  Past Medical History:  Diagnosis Date  . Asthma   . BPH (benign prostatic hypertrophy)   . Cataract   . Chronic renal disease, stage III ([place])   . Chronic sinusitis   . COPD (chronic obstructive pulmonary disease) ([place])   . Coronary artery disease    2 stents  . Gall stones   . Hearing loss    DOES NOT WEAR HEARING AIDS  . High cholesterol   . Hypertension   . Macular degeneration    right eye    Patient Active Problem List   Diagnosis Date Noted  . Acute cholecystitis 11/26/2020  . Preventative health care 10/12/2020  . Macular degeneration, wet ([place]) 10/12/2020  . Diarrhea 09/08/2020  . Acquired claw toe, left 02/02/2020  . BPH (benign prostatic hyperplasia) 01/13/2020  . Neuropathy 02/18/2018  . COPD (chronic obstructive pulmonary disease) ([place])   . Chronic renal disease, stage III ([place])   . Benign essential HTN 04/09/2017  . CAD (coronary artery disease) 03/03/2015  . Hyperlipidemia 03/03/2015    Past Surgical History:  Procedure Laterality Date  . APPENDECTOMY    . CORONARY ANGIOPLASTY WITH STENT PLACEMENT  1998   2 stents  . TRANSURETHRAL RESECTION OF  PROSTATE      Prior to Admission medications   Medication Sig Start Date End Date Taking? Authorizing Provider  albuterol (VENTOLIN HFA) 108 (90 Base) MCG/ACT inhaler Inhale 2 puffs into the lungs every 6 (six) hours as needed for wheezing or shortness of breath. 09/06/20   Bedsole, Amy E, MD  aspirin EC 81 MG tablet Take 81 mg by mouth daily.    [provider]  betamethasone dipropionate 0.05 % cream Apply topically 2 (two) times daily. 08/23/20   Edrick Kins, DPM  budesonide-formoterol (SYMBICORT) 160-4.5 MCG/ACT inhaler INHALE 2 PUFFS INTO THE LUNGS TWICE DAILY 09/28/20   Venia Carbon, MD  gabapentin (NEURONTIN) 300 MG capsule Take 300 mg by mouth at bedtime.    [provider]  gentamicin cream (GARAMYCIN) 0.1 % APPLY 1 APPLICATION TOPICALLY TWICE DAILY 08/21/20   Edrick Kins, DPM  lisinopril-hydrochlorothiazide (ZESTORETIC) 20-25 MG tablet TAKE 1 TABLET BY MOUTH ONCE DAILY 08/15/20   Venia Carbon, MD  metoprolol succinate (TOPROL-XL) 25 MG 24 hr tablet Take 1 tablet (25 mg total) by mouth daily. 08/02/20   Venia Carbon, MD  NONFORMULARY OR COMPOUNDED ITEM See pharmacy note 06/25/19   Felipa Furnace, DPM  pantoprazole (PROTONIX) 40 MG tablet TAKE 1 TABLET BY MOUTH ONCE A DAY 08/15/20   Viviana Simpler I, MD  simvastatin (ZOCOR) 40 MG tablet Take 40 mg by mouth every  evening.    [provider]  tamsulosin (FLOMAX) 0.4 MG CAPS capsule TAKE 1 CAPSULE BY MOUTH ONCE DAILY 07/14/20   Venia Carbon, MD  traMADol (ULTRAM) 50 MG tablet 50 mg 2 (two) times daily as needed.  08/15/17   [provider]    Allergies Sulfa antibiotics, Neosporin [neomycin-bacitracin zn-polymyx], Augmentin [amoxicillin-pot clavulanate], and Terbinafine and related  Family History  Problem Relation Age of Onset  . Diabetes Father   . Heart disease Brother     Social History Social History   Tobacco Use  . Smoking status: Former Smoker    Types: Cigarettes     Quit date: 08/13/2005    Years since quitting: 15.3  . Smokeless tobacco: Current User    Types: Chew  . Tobacco comment: discussed stopping this (only once in a while)  Vaping Use  . Vaping Use: Never used  Substance Use Topics  . Alcohol use: No  . Drug use: No    Review of Systems  Constitutional: No fever. Eyes: No redness. ENT: No sore throat. Cardiovascular: Denies chest pain. Respiratory: Denies shortness of breath. Gastrointestinal: No vomiting or diarrhea.  Genitourinary: Negative for flank pain.  Musculoskeletal: Negative for back pain. Skin: Negative for rash. Neurological: Negative for headache.   ____________________________________________   PHYSICAL EXAM:  VITAL SIGNS: ED Triage Vitals  Enc Vitals Group     BP 11/26/20 1512 (!) 174/70     Pulse Rate 11/26/20 1512 73     Resp 11/26/20 1512 18     Temp 11/26/20 1512 98.5 F (36.9 C)     Temp Source 11/26/20 1512 Oral     SpO2 11/26/20 1512 98 %     Weight 11/26/20 1511 160 lb (72.6 kg)     Height 11/26/20 1511 5\' 7"  (1.702 m)     Head Circumference --      Peak Flow --      Pain Score 11/26/20 1511 7     Pain Loc --      Pain Edu? --      Excl. in [place]? --     Constitutional: Alert and oriented.  Uncomfortable appearing but in no acute distress. Eyes: Conjunctivae are normal.  No scleral icterus. Head: Atraumatic. Nose: No congestion/rhinnorhea. Mouth/Throat: Mucous membranes are dry. Neck: Normal range of motion.  Cardiovascular: Normal rate, regular rhythm.  Good peripheral circulation. Respiratory: Normal respiratory effort.  No retractions.  Gastrointestinal: Soft and nontender. No distention.  Genitourinary: No flank tenderness. Musculoskeletal: Extremities warm and well perfused.  Neurologic:  Normal speech and language. No gross focal neurologic deficits are appreciated.  Skin:  Skin is warm and dry. No rash noted. Psychiatric: Mood and affect are normal. Speech and behavior are  normal.  ____________________________________________   LABS (all labs ordered are listed, but only abnormal results are displayed)  Labs Reviewed  COMPREHENSIVE METABOLIC PANEL - Abnormal; Notable for the following components:      Result Value   Glucose, Bld 122 (*)    All other components within normal limits  CBC - Abnormal; Notable for the following components:   WBC 16.0 (*)    RBC 4.02 (*)    Hemoglobin 11.4 (*)    HCT 36.4 (*)    All other components within normal limits  URINALYSIS, COMPLETE (UACMP) WITH MICROSCOPIC - Abnormal; Notable for the following components:   Color, Urine STRAW (*)    APPearance CLEAR (*)    Specific Gravity, Urine 1.039 (*)  Glucose, UA 50 (*)    Hgb urine dipstick MODERATE (*)    Ketones, ur 5 (*)    Protein, ur 100 (*)    All other components within normal limits  RESP PANEL BY RT-PCR (FLU A&B, COVID) ARPGX2  URINE CULTURE  CULTURE, BLOOD (ROUTINE X 2)  CULTURE, BLOOD (ROUTINE X 2)  LIPASE, BLOOD  LACTIC ACID, PLASMA  LACTIC ACID, PLASMA  CBC WITH DIFFERENTIAL/PLATELET  COMPREHENSIVE METABOLIC PANEL  MAGNESIUM  PHOSPHORUS   ____________________________________________  EKG  ED ECG REPORT I, Arta Silence, the attending physician, personally viewed and interpreted this ECG.  Date: 11/26/2020 EKG Time: 1508 Rate: 73 Rhythm: normal sinus rhythm QRS Axis: normal Intervals: RBBB ST/T Wave abnormalities: Nonspecific T wave abnormalities Narrative Interpretation: no evidence of acute ischemia; no significant change when compared to EKG of 09/13/2020 (except for resolved bigeminy)  ____________________________________________  RADIOLOGY  CT abdomen/pelvis: Possible pericholecystic fluid and gallbladder wall thickening US abdomen RUQ: 9 mm stone in the gallbladder which appears contracted.   ____________________________________________   PROCEDURES  Procedure(s) performed: No  Procedures  Critical Care performed:  No ____________________________________________   INITIAL IMPRESSION / ASSESSMENT AND PLAN / ED COURSE  Pertinent labs & imaging results that were available during my care of the patient were reviewed by me and considered in my medical decision making (see chart for details).  87 year old male with PMH as noted above presents with epigastric pain since early this morning associated with some nausea but no vomiting or diarrhea.  I reviewed the past medical records in [place].  The patient was most recently seen in the ED in early February with concern for dehydration after a diarrheal illness with fever.  Work-up in the ED was reassuring.  On exam, the patient is uncomfortable appearing but in no acute distress.  His vital signs are normal except for hypertension.  The abdomen is soft without any significant focal tenderness.  Mucous membranes are dry.  Exam is otherwise unremarkable.  Differential includes gastritis, PUD, pancreatitis, other hepatobiliary cause, gastroenteritis, colitis, diverticulitis, or less likely SBO, volvulus.  We will obtain a lab work-up, CT abdomen/pelvis, and give analgesia.  ----------------------------------------- 12:28 AM on 11/27/2020 -----------------------------------------  CT showed signs which were suggestive of possible cholecystitis.  This would go along with the patient's symptoms.  We obtained a right upper quadrant ultrasound which was somewhat equivocal, showing a large gallstone but a contracted gallbladder with no significant wall thickening or fluid.  I consulted Dr. Christian Mate from surgery who recommended antibiotics and admission to the hospitalist and advised that he would consult on the patient.  I then consulted Dr. Nevada Crane from the hospitalist service for admission.  ____________________________________________   FINAL CLINICAL IMPRESSION(S) / ED DIAGNOSES  Final diagnoses:  Epigastric pain      NEW MEDICATIONS STARTED DURING THIS  VISIT:  Current Discharge Medication List       Note:  This document was prepared using Dragon voice recognition software and may include unintentional dictation errors.   Arta Silence, MD 11/27/20 0030

## 2020-11-26 NOTE — Consult Note (Signed)
Patient ID: Leon Estes, male   DOB: 10-27-1930, 87 y.o.   MRN: [id number]  Chief Complaint: Right upper quadrant/epigastric pain  History of Present Illness Leon Estes is a 87 y.o. male with acute onset starting at 63 AM yesterday morning.  States he usually has a bowl of ice cream every night.  Has not had prior similar pain.  ED work-up reveals a 9 mm gallstone in a very contracted gallbladder.  At present he is pain-free.  And currently is uninterested in any surgical consideration at this time.  No known jaundice, no acholic school stools.  No melena, or hematochezia.  Past Medical History Past Medical History:  Diagnosis Date  . Asthma   . BPH (benign prostatic hypertrophy)   . Cataract   . Chronic renal disease, stage III ([place])   . Chronic sinusitis   . COPD (chronic obstructive pulmonary disease) ([place])   . Coronary artery disease    2 stents  . Gall stones   . Hearing loss    DOES NOT WEAR HEARING AIDS  . High cholesterol   . Hypertension   . Macular degeneration    right eye      Past Surgical History:  Procedure Laterality Date  . APPENDECTOMY    . CORONARY ANGIOPLASTY WITH STENT PLACEMENT  1998   2 stents  . TRANSURETHRAL RESECTION OF PROSTATE      Allergies  Allergen Reactions  . Sulfa Antibiotics Rash  . Neosporin [Neomycin-Bacitracin Zn-Polymyx]   . Augmentin [Amoxicillin-Pot Clavulanate] Rash  . Terbinafine And Related Rash    Current Facility-Administered Medications  Medication Dose Route Frequency Provider Last Rate Last Admin  . acetaminophen (TYLENOL) tablet 650 mg  650 mg Oral Q6H PRN Irene Pap N, DO      . albuterol (VENTOLIN HFA) 108 (90 Base) MCG/ACT inhaler 2 puff  2 puff Inhalation Q6H PRN Kayleen Memos, DO      . aspirin EC tablet 81 mg  81 mg Oral Daily Hall, Carole N, DO      . ceFEPIme (MAXIPIME) 2 g in sodium chloride 0.9 % 100 mL IVPB  2 g Intravenous Q24H Hall, Carole N, DO      . enoxaparin (LOVENOX) injection 40 mg  40 mg  Subcutaneous Q24H Hall, Carole N, DO   40 mg at 11/26/20 2214  . lactated ringers infusion   Intravenous Continuous Kayleen Memos, DO 50 mL/hr at 11/27/20 3825 Restarted at 11/27/20 0538  . metoprolol succinate (TOPROL-XL) 24 hr tablet 25 mg  25 mg Oral Daily Hall, Carole N, DO      . metoprolol tartrate (LOPRESSOR) injection 2.5 mg  2.5 mg Intravenous Q4H PRN Irene Pap N, DO      . metroNIDAZOLE (FLAGYL) IVPB 500 mg  500 mg Intravenous [address] N, DO   Stopped at 11/27/20 0514  . mometasone-formoterol (DULERA) 200-5 MCG/ACT inhaler 2 puff  2 puff Inhalation BID Irene Pap N, DO      . ondansetron (ZOFRAN) injection 4 mg  4 mg Intravenous Q6H PRN Irene Pap N, DO      . pantoprazole (PROTONIX) EC tablet 40 mg  40 mg Oral Daily Irene Pap N, DO   40 mg at 11/26/20 2214  . simvastatin (ZOCOR) tablet 40 mg  40 mg Oral QPM Hall, Carole N, DO      . tamsulosin (FLOMAX) capsule 0.4 mg  0.4 mg Oral Daily [place], Carole N, DO      .  traMADol (ULTRAM) tablet 50 mg  50 mg Oral Q6H PRN Kayleen Memos, DO        Family History Family History  Problem Relation Age of Onset  . Diabetes Father   . Heart disease Brother       Social History Social History   Tobacco Use  . Smoking status: Former Smoker    Types: Cigarettes    Quit date: 08/13/2005    Years since quitting: 15.3  . Smokeless tobacco: Current User    Types: Chew  . Tobacco comment: discussed stopping this (only once in a while)  Vaping Use  . Vaping Use: Never used  Substance Use Topics  . Alcohol use: No  . Drug use: No        Review of Systems  Constitutional: Negative for chills and fever.  HENT: Negative for sore throat.   Eyes: Negative for pain.  Respiratory: Negative for shortness of breath.   Cardiovascular: Negative for chest pain.  Gastrointestinal: Negative for blood in stool, diarrhea, melena, nausea and vomiting.  Genitourinary: Negative for dysuria.  Musculoskeletal: Negative for back pain.   Skin: Negative for rash.  Neurological: Negative for headaches.      Physical Exam Blood pressure (!) 144/65, pulse 91, temperature (!) 100.6 F (38.1 C), temperature source Oral, resp. rate 18, height 5\' 7"  (1.702 m), weight 72.6 kg, SpO2 91 %. Last Weight  Most recent update: 11/26/2020  3:11 PM   Weight  72.6 kg (160 lb)            CONSTITUTIONAL: Well developed, and nourished, appropriately responsive and aware without distress.  Hard of hearing. EYES: Sclera non-icteric.   EARS, NOSE, MOUTH AND THROAT: Oral mucosa is pink and moist.   Hearing is intact to voice.  NECK: Trachea is midline, and there is no jugular venous distension.  LYMPH NODES:  Lymph nodes in the neck are not enlarged. RESPIRATORY:  Lungs are mostly clear, scattered rhonchi and breath sounds are diminished in bases, but equal bilaterally. Normal respiratory effort without pathologic use of accessory muscles. CARDIOVASCULAR: Heart is regular in rate and rhythm. GI: The abdomen is well rounded soft, nontender, and nondistended. There were no palpable masses. I did not appreciate hepatosplenomegaly. There were normal bowel sounds. MUSCULOSKELETAL:  Symmetrical muscle tone appreciated. SKIN: Skin turgor is normal. No pathologic skin lesions appreciated.  NEUROLOGIC:  Motor and sensation appear grossly normal.  Cranial nerves are grossly without defect. PSYCH:  Alert and oriented to person, place and time. Affect is appropriate for situation.  Data Reviewed I have personally reviewed what is currently available of the patient's imaging, recent labs and medical records.   Labs:  CBC Latest Ref Rng & Units 11/27/2020 11/26/2020 09/13/2020  WBC 4.0 - 10.5 K/uL 17.1(H) 16.0(H) 12.9(H)  Hemoglobin 13.0 - 17.0 g/dL 11.6(L) 11.4(L) 11.2(L)  Hematocrit 39.0 - 52.0 % 35.4(L) 36.4(L) 33.7(L)  Platelets 150 - 400 K/uL 267 266 459(H)   CMP Latest Ref Rng & Units 11/27/2020 11/26/2020 09/13/2020  Glucose 70 - 99 mg/dL 122(H)  122(H) 124(H)  BUN 8 - 23 mg/dL 19 20 40(H)  Creatinine 0.61 - 1.24 mg/dL 1.15 1.17 1.41(H)  Sodium 135 - 145 mmol/L 137 135 140  Potassium 3.5 - 5.1 mmol/L 4.2 4.4 4.1  Chloride 98 - 111 mmol/L 99 100 105  CO2 22 - 32 mmol/L 28 28 25   Calcium 8.9 - 10.3 mg/dL 8.9 8.9 9.1  Total Protein 6.5 - 8.1 g/dL 7.0 7.5 -  Total Bilirubin 0.3 - 1.2 mg/dL 1.1 0.8 -  Alkaline Phos 38 - 126 U/L 58 65 -  AST 15 - 41 U/L 23 26 -  ALT 0 - 44 U/L 19 21 -      Imaging: Radiology review: Images reviewed. Gallbladder is remarkable for its contracted status.   Within last 24 hrs: CT ABDOMEN PELVIS W CONTRAST  Result Date: 11/26/2020 CLINICAL DATA:  Epigastric pain. EXAM: CT ABDOMEN AND PELVIS WITH CONTRAST TECHNIQUE: Multidetector CT imaging of the abdomen and pelvis was performed using the standard protocol following bolus administration of intravenous contrast. CONTRAST:  182mL OMNIPAQUE IOHEXOL 300 MG/ML  SOLN COMPARISON:  CT abdomen pelvis 09/13/2020, CT abdomen pelvis 05/04/2013 FINDINGS: Lower chest: Bilateral lower lobe subsegmental atelectasis. 8 mm nodular-like triangular subpleural density within the lingula. Hepatobiliary: No focal liver abnormality. No gallstones. Question borderline thickened gallbladder wall thickening and trace pericholecystic fluid. No biliary dilatation. Pancreas: No focal lesion. Normal pancreatic contour. No surrounding inflammatory changes. No main pancreatic ductal dilatation. Spleen: Normal in size without focal abnormality. Adrenals/Urinary Tract: No adrenal nodule bilaterally. Bilateral kidneys enhance symmetrically. 2.7 cm fluid density lesion within left kidney likely represents a simple renal cyst. No hydronephrosis. No hydroureter. The urinary bladder is unremarkable. Stomach/Bowel: Stomach is within normal limits. Second portion of the duodenum diverticula again noted. No evidence of bowel wall thickening or dilatation. Appendix appears normal. Vascular/Lymphatic: The  portal, splenic, superior mesenteric veins are patent. No abdominal aorta or iliac aneurysm. Severe atherosclerotic plaque of the aorta and its branches. No abdominal, pelvic, or inguinal lymphadenopathy. Reproductive: The prostate is enlarged measuring up to 6.4 cm. Other: No large volume intraperitoneal free fluid. No intraperitoneal free gas. No organized fluid collection. Musculoskeletal: No abdominal wall hernia or abnormality. No suspicious lytic or blastic osseous lesions. No acute displaced fracture. Multilevel degenerative changes of the spine with intervertebral disc space vacuum phenomenon endplate sclerosis at the L4-L5 and L5-S1 levels. Grade 1-2 anterolisthesis of L5 on S1. IMPRESSION: 1. Question borderline thickened gallbladder wall thickening and trace pericholecystic fluid. Recommend right upper quadrant ultrasound for a more sensitive evaluation of the gallbladder. 2. Prostatomegaly. 3. Severe degenerative changes of the L4 through S1 levels with grade 1-2 anterolisthesis of L5 on S1. 4.  Aortic Atherosclerosis (ICD10-I70.0). 5. An 8 mm nodular-like subpleural triangular density within the lingula that likely represents an intrapulmonary lymph node. Non-contrast chest CT at 6-12 months is recommended. If the nodule is stable at time of repeat CT, then future CT at 18-24 months (from today's scan) is considered optional for low-risk patients, but is recommended for high-risk patients. This recommendation follows the consensus statement: Guidelines for Management of Incidental Pulmonary Nodules Detected on CT Images: From the Fleischner Society 2017; Radiology 2017; 284:228-243. Electronically Signed   By: Iven Finn M.D.   On: 11/26/2020 17:03   US Abdomen Limited RUQ (LIVER/GB)  Result Date: 11/26/2020 CLINICAL DATA:  Epigastric pain for 1 day. EXAM: ULTRASOUND ABDOMEN LIMITED RIGHT UPPER QUADRANT COMPARISON:  None. FINDINGS: Gallbladder: The gallbladder is contracted which limits  evaluation. No gallbladder wall thickening. There is a calculus measuring 0.9 cm. Negative sonographic Murphy sign. Common bile duct: Diameter: 0.6 cm, within normal limits. Liver: No focal lesion identified. Within normal limits in parenchymal echogenicity. Portal vein is patent on color Doppler imaging with normal direction of blood flow towards the liver. Other: None. IMPRESSION: The gallbladder is contracted which limits evaluation. Cholelithiasis without other evidence of acute cholecystitis. Electronically Signed   By: Izora Gala  Dimas Aguas M.D.   On: 11/26/2020 18:17    Assessment    Possible subacute on chronic calculus cholecystitis. Patient Active Problem List   Diagnosis Date Noted  . Acute cholecystitis 11/26/2020  . Preventative health care 10/12/2020  . Macular degeneration, wet ([place]) 10/12/2020  . Diarrhea 09/08/2020  . Acquired claw toe, left 02/02/2020  . BPH (benign prostatic hyperplasia) 01/13/2020  . Neuropathy 02/18/2018  . COPD (chronic obstructive pulmonary disease) ([place])   . Chronic renal disease, stage III ([place])   . Benign essential HTN 04/09/2017  . CAD (coronary artery disease) 03/03/2015  . Hyperlipidemia 03/03/2015    Plan    Full agreement with IV antibiotics.  And further evaluation of gallbladder with HIDA scan.  We will continue to assess/follow for sepsis and altered liver function.  Face-to-face time spent with the patient and accompanying care providers(if present) was 55 minutes, with more than 50% of the time spent counseling, educating, and coordinating care of the patient.      Ronny Bacon M.D., FACS 11/27/2020, 8:01 AM

## 2020-11-26 NOTE — ED Triage Notes (Signed)
Pt comes pov with epigastric pain starting last night after eating dinner. Pt denies n/v/d. States it feels like a deep hurt inside his stomach.

## 2020-11-27 ENCOUNTER — Inpatient Hospital Stay: Payer: PPO

## 2020-11-27 DIAGNOSIS — G9341 Metabolic encephalopathy: Secondary | ICD-10-CM

## 2020-11-27 DIAGNOSIS — I10 Essential (primary) hypertension: Secondary | ICD-10-CM

## 2020-11-27 DIAGNOSIS — I2510 Atherosclerotic heart disease of native coronary artery without angina pectoris: Secondary | ICD-10-CM

## 2020-11-27 DIAGNOSIS — N400 Enlarged prostate without lower urinary tract symptoms: Secondary | ICD-10-CM

## 2020-11-27 LAB — PHOSPHORUS: Phosphorus: 2.6 mg/dL (ref 2.5–4.6)

## 2020-11-27 LAB — COMPREHENSIVE METABOLIC PANEL
ALT: 19 U/L (ref 0–44)
AST: 23 U/L (ref 15–41)
Albumin: 3.6 g/dL (ref 3.5–5.0)
Alkaline Phosphatase: 58 U/L (ref 38–126)
Anion gap: 10 (ref 5–15)
BUN: 19 mg/dL (ref 8–23)
CO2: 28 mmol/L (ref 22–32)
Calcium: 8.9 mg/dL (ref 8.9–10.3)
Chloride: 99 mmol/L (ref 98–111)
Creatinine, Ser: 1.15 mg/dL (ref 0.61–1.24)
GFR, Estimated: >60 mL/min (ref >60)
Glucose, Bld: 122 mg/dL — ABNORMAL HIGH (ref 70–99)
Potassium: 4.2 mmol/L (ref 3.5–5.1)
Sodium: 137 mmol/L (ref 135–145)
Total Bilirubin: 1.1 mg/dL (ref 0.3–1.2)
Total Protein: 7 g/dL (ref 6.5–8.1)

## 2020-11-27 LAB — CBC WITH DIFFERENTIAL/PLATELET
Abs Immature Granulocytes: 0.12 10*3/uL — ABNORMAL HIGH (ref 0.00–0.07)
Basophils Absolute: 0.1 10*3/uL (ref 0.0–0.1)
Basophils Relative: 0 %
Eosinophils Absolute: 0 10*3/uL (ref 0.0–0.5)
Eosinophils Relative: 0 %
HCT: 35.4 % — ABNORMAL LOW (ref 39.0–52.0)
Hemoglobin: 11.6 g/dL — ABNORMAL LOW (ref 13.0–17.0)
Immature Granulocytes: 1 %
Lymphocytes Relative: 11 %
Lymphs Abs: 1.8 10*3/uL (ref 0.7–4.0)
MCH: 29 pg (ref 26.0–34.0)
MCHC: 32.8 g/dL (ref 30.0–36.0)
MCV: 88.5 fL (ref 80.0–100.0)
Monocytes Absolute: 1.8 10*3/uL — ABNORMAL HIGH (ref 0.1–1.0)
Monocytes Relative: 11 %
Neutro Abs: 13.3 10*3/uL — ABNORMAL HIGH (ref 1.7–7.7)
Neutrophils Relative %: 77 %
Platelets: 267 10*3/uL (ref 150–400)
RBC: 4 MIL/uL — ABNORMAL LOW (ref 4.22–5.81)
RDW: 14 % (ref 11.5–15.5)
WBC: 17.1 10*3/uL — ABNORMAL HIGH (ref 4.0–10.5)
nRBC: 0 % (ref 0.0–0.2)

## 2020-11-27 LAB — LIPASE, BLOOD: Lipase: 24 U/L (ref 11–51)

## 2020-11-27 LAB — MAGNESIUM: Magnesium: 1.6 mg/dL — ABNORMAL LOW (ref 1.7–2.4)

## 2020-11-27 MED ORDER — LISINOPRIL 2.5 MG PO TABS
2.5000 mg | ORAL_TABLET | Freq: Every day | ORAL | Status: DC
  Administered 2020-11-27 – 2020-11-29 (×3): 2.5 mg via ORAL
  Filled 2020-11-27 (×3): qty 1

## 2020-11-27 MED ORDER — MAGNESIUM SULFATE 2 GM/50ML IV SOLN
2.0000 g | Freq: Once | INTRAVENOUS | Status: AC
  Administered 2020-11-27: 2 g via INTRAVENOUS
  Filled 2020-11-27: qty 50

## 2020-11-27 NOTE — Evaluation (Signed)
Occupational Therapy Evaluation Patient Details Name: Leon Estes MRN: [id number] DOB: 01-13-1931 Today's Date: 11/27/2020    History of Present Illness Pt is an 87 y/o M with PMH: CAD, COPD, HTN, BPH, polyneuropathy, and HLD. Pt presented to ED with chief c/o RUQ pain. CT with questionable gallbladder wall thickening.  Right upper quadrant ultrasound is recommended per MD note, for a more sensitive evaluation of the gallbladder.   Clinical Impression   Pt seen for OT evaluation this date in setting of acute hospitalization d/t RUQ pain. Pt is INDEP with ADLs and fxl mobility with no AD at baseline. His spouse assists for IADLs including transportation as pt is unable d/t his eyesight. Pt presents this date with some general deconditioning and decreased balance this date impacting his ability to safely perform ADLs/ADL mobility. Pt requires MIN A/CGA for ADL transfers with use of RW and requires MIN A for standing LB ADLs d/t decreased dynamic standing balance. Pt will benefit from continued OT f/u in acute setting for fall prevention and strengthening and anticipate that pt could benefit from [facility] f/u to ensure safety in the natural encironment.     Follow Up Recommendations  Home health OT    Equipment Recommendations  None recommended by OT    Recommendations for Other Services       Precautions / Restrictions Precautions Precautions: Fall Restrictions Weight Bearing Restrictions: No      Mobility Bed Mobility Overal bed mobility: Modified Independent            Transfers Overall transfer level: Needs assistance Equipment used: Rolling walker (2 wheeled) Transfers: Sit to/from Stand Sit to Stand: Min guard;Min assist         General transfer comment: cues for safe walker use    Balance Overall balance assessment: Needs assistance Sitting-balance support: Feet supported Sitting balance-Leahy Scale: Good     Standing balance support: Bilateral upper  extremity supported Standing balance-Leahy Scale: Fair Standing balance comment: one instance of retropulsion upon initial CTS requiring assist to correct, otherwise G static standing with UE support                 ADL either performed or assessed with clinical judgement   ADL Overall ADL's : Needs assistance/impaired                       General ADL Comments: SETUP for seated UB and LB ADLs, MIN A for standing LB ADLs for balance support with use of RW     Vision Patient Visual Report: No change from baseline       Perception     Praxis      Pertinent Vitals/Pain Pain Assessment: Faces Faces Pain Scale: Hurts a little bit Pain Location: abdomen Pain Descriptors / Indicators: Tender Pain Intervention(s): Monitored during session     Hand Dominance Right   Extremity/Trunk Assessment Upper Extremity Assessment Upper Extremity Assessment: Overall WFL for tasks assessed;Generalized weakness (ROM WFL, MMT grossly 4-/5)   Lower Extremity Assessment Lower Extremity Assessment: Overall WFL for tasks assessed;Generalized weakness       Communication Communication Communication: No difficulties   Cognition Arousal/Alertness: Awake/alert Behavior During Therapy: WFL for tasks assessed/performed Overall Cognitive Status: Within Functional Limits for tasks assessed                 General Comments: HOH so some delayed responses, but generally appropraite, oriented and able to follow commands.   General Comments  Exercises Other Exercises Other Exercises: OT educates pt and family re: role of OT, safe use of RW, goals. All with good understanding.   Shoulder Instructions      Home Living Family/patient expects to be discharged to:: Private residence Living Arrangements: Spouse/significant other Available Help at Discharge: Family;Available 24 hours/day Type of Home: House Home Access: Stairs to enter     Home Layout: One level                Home Equipment: Environmental consultant - 2 wheels;Cane - single point          Prior Functioning/Environment Level of Independence: Needs assistance  Gait / Transfers Assistance Needed: INDEP. Has walker and cane, but rarely uses. ADL's / Homemaking Assistance Needed: Pt is INDEP with self care. Spouse drives d/t pt with decreased vision.            OT Problem List: Decreased strength;Decreased activity tolerance;Cardiopulmonary status limiting activity      OT Treatment/Interventions: Self-care/ADL training;DME and/or AE instruction;Therapeutic activities;Balance training;Therapeutic exercise;Patient/family education    OT Goals(Current goals can be found in the care plan section) Acute Rehab OT Goals Patient Stated Goal: to go home OT Goal Formulation: With patient Time For Goal Achievement: 12/11/20 Potential to Achieve Goals: Good ADL Goals Pt Will Perform Lower Body Dressing: with modified independence;sit to/from stand Pt Will Transfer to Toilet: with modified independence;ambulating (with LRAD to/from restroom) Pt/caregiver will Perform Home Exercise Program: Increased strength;Both right and left upper extremity;With Supervision  OT Frequency: Min 1X/week   Barriers to D/C:            Co-evaluation              AM-PAC OT "6 Clicks" Daily Activity     Outcome Measure Help from another person eating meals?: None Help from another person taking care of personal grooming?: None Help from another person toileting, which includes using toliet, bedpan, or urinal?: A Little Help from another person bathing (including washing, rinsing, drying)?: A Little Help from another person to put on and taking off regular upper body clothing?: None Help from another person to put on and taking off regular lower body clothing?: A Little 6 Click Score: 21   End of Session Equipment Utilized During Treatment: Gait belt;Rolling walker Nurse Communication: Mobility status;Other  (comment) (IV out)  Activity Tolerance: Patient tolerated treatment well Patient left: in bed;with call bell/phone within reach;with bed alarm set;with family/visitor present  OT Visit Diagnosis: Unsteadiness on feet (R26.81);Muscle weakness (generalized) (M62.81)                Time: 2542-7062 OT Time Calculation (min): 21 min Charges:  OT General Charges $OT Visit: 1 Visit OT Evaluation $OT Eval Moderate Complexity: 1 Mod OT Treatments $Self Care/Home Management : 8-22 mins  Gerrianne Scale, MS, OTR/L ascom [phone] 11/27/20, 1:39 PM

## 2020-11-27 NOTE — Progress Notes (Addendum)
PT recommends HHPT. CSW spoke with patient. Patient says he lives with his wife and has lived with her for 35 years (patient is 87 years old). CSW asked patient if it is ok if CSW calls his wife about DC planning. He says yes.  CSW attempted call to wife at both #'s listed in chart, unable to get through. Asked RN to ask patient's wife to call CSW at [phone] if she comes to the hospital today and/or get an updated contact number for patient's wife.  Oleh Genin, [place]

## 2020-11-27 NOTE — Evaluation (Signed)
Physical Therapy Evaluation Patient Details Name: Leon Estes MRN: [id number] DOB: June 30, 1931 Today's Date: 11/27/2020   History of Present Illness  Pt is a 87 y.o. male with medical history significant for coronary artery disease, COPD, essential hypertension, CKD, appendectomy, hearing loss, macular degeneration, polyneuropathy, hyperlipidemia, BPH who presented to [facility] ED with complaints of sudden onset right upper quadrant abdominal pain, suspect acaute cholecystitis. Also with some hypersomnolence this admission. Pt to undergo [place] for gall bladder evaluation.    Clinical Impression  Pt alert, agreeable to PT, orientedx4.At baseline pt reported he is independent, lives with his wife who drives due to pt vision deficits.  The patient demonstrated modI bed mobility. Sit <> stand several times during session, initial attempt retroplusion noted with minA to correct, but subsequent attempts pt CGA. Pt did endorse that the instability also happens at baseline (has RW and SPC but does not use regularly). He was able to ambulate ~75ft with CGA no AD. He had occasional gait path deviations but able to ambulate without LOB or physical assist. Some SOB noted, spO2 90% or higher on room air before, during, and after mobility. Pt educated on the benefit of continued mobility during hospital stay as well as potential HHPT. Overall the patient demonstrated mild deficits from PLOF but would benefit from HHPT to maximize activity tolerance/endurance, safety, and balance. Recommendation is HHPT with supervision for mobility/OOB.     Follow Up Recommendations Home health PT;Supervision for mobility/OOB    Equipment Recommendations  None recommended by PT    Recommendations for Other Services       Precautions / Restrictions Precautions Precautions: Fall Restrictions Weight Bearing Restrictions: No      Mobility  Bed Mobility Overal bed mobility: Modified Independent                   Transfers Overall transfer level: Needs assistance Equipment used: None Transfers: Sit to/from Stand Sit to Stand: Min guard;Min assist         General transfer comment: for first sit <> stand minA for steadying due to some poterior lean noted. further attempts pt able to perform with CGA  Ambulation/Gait Ambulation/Gait assistance: Min guard Gait Distance (Feet): 85 Feet Assistive device: None       General Gait Details: pt with occasional gait path deviations but able to ambulate without AD with CGA. some SOB noted, spO2 90% or higher on room air  Stairs            Wheelchair Mobility    Modified Rankin (Stroke Patients Only)       Balance Overall balance assessment: Needs assistance Sitting-balance support: Feet supported Sitting balance-Leahy Scale: Good     Standing balance support: Bilateral upper extremity supported Standing balance-Leahy Scale: Fair Standing balance comment: one instance of retropulsion upon initial stand requiring assist to correct, otherwise G static standing with UE support                             Pertinent Vitals/Pain Pain Assessment: Faces Faces Pain Scale: Hurts a little bit Pain Location: abdomen Pain Descriptors / Indicators: Tender Pain Intervention(s): Limited activity within patient's tolerance;Monitored during session;Repositioned    Home Living Family/patient expects to be discharged to:: Private residence Living Arrangements: Spouse/significant other Available Help at Discharge: Family;Available 24 hours/day Type of Home: House Home Access: Stairs to enter     Home Layout: One level Home Equipment: Environmental consultant - 2 wheels;Cane -  single point      Prior Function Level of Independence: Needs assistance   Gait / Transfers Assistance Needed: INDEP. Has walker and cane, but rarely uses.  ADL's / Homemaking Assistance Needed: Pt is INDEP with self care. Spouse drives d/t pt with decreased vision.         Hand Dominance   Dominant Hand: Right    Extremity/Trunk Assessment   Upper Extremity Assessment Upper Extremity Assessment: Defer to OT evaluation    Lower Extremity Assessment Lower Extremity Assessment: Generalized weakness    Cervical / Trunk Assessment Cervical / Trunk Assessment: Normal  Communication   Communication: No difficulties  Cognition Arousal/Alertness: Awake/alert Behavior During Therapy: WFL for tasks assessed/performed Overall Cognitive Status: Within Functional Limits for tasks assessed                                 General Comments: HOH      General Comments      Exercises Other Exercises Other Exercises: Pt educated on PT role, POC, continued mobility importance   Assessment/Plan    PT Assessment Patient needs continued PT services  PT Problem List Decreased activity tolerance;Decreased mobility;Decreased balance       PT Treatment Interventions DME instruction;Balance training;Neuromuscular re-education;Gait training;Stair training;Functional mobility training;Patient/family education;Therapeutic activities;Therapeutic exercise    PT Goals (Current goals can be found in the Care Plan section)  Acute Rehab PT Goals Patient Stated Goal: to feel better, go home PT Goal Formulation: With patient Time For Goal Achievement: 12/11/20 Potential to Achieve Goals: Good    Frequency Min 2X/week   Barriers to discharge        Co-evaluation               AM-PAC PT "6 Clicks" Mobility  Outcome Measure Help needed turning from your back to your side while in a flat bed without using bedrails?: None Help needed moving from lying on your back to sitting on the side of a flat bed without using bedrails?: None Help needed moving to and from a bed to a chair (including a wheelchair)?: None Help needed standing up from a chair using your arms (e.g., wheelchair or bedside chair)?: A Little Help needed to walk in hospital  room?: A Little Help needed climbing 3-5 steps with a railing? : A Little 6 Click Score: 21    End of Session Equipment Utilized During Treatment: Gait belt Activity Tolerance: Patient tolerated treatment well Patient left: in chair;with call bell/phone within reach;with chair alarm set Nurse Communication: Mobility status PT Visit Diagnosis: Other abnormalities of gait and mobility (R26.89);Muscle weakness (generalized) (M62.81);Difficulty in walking, not elsewhere classified (R26.2)    Time: 9622-2979 PT Time Calculation (min) (ACUTE ONLY): 17 min   Charges:   PT Evaluation $PT Eval Low Complexity: 1 Low PT Treatments $Therapeutic Exercise: 8-22 mins       Lieutenant Diego PT, DPT 2:36 PM,11/27/20

## 2020-11-27 NOTE — Progress Notes (Addendum)
PROGRESS NOTE    IRVING BLOOR  KGU:[id number] DOB: 1931-06-18 DOA: 11/26/2020 PCP: Venia Carbon, MD    Brief Narrative:  Leon Estes is a 87 y.o. male with medical history significant for coronary artery disease, COPD, essential hypertension, polyneuropathy, hyperlipidemia, BPH who presented to [facility] ED with complaints of sudden onset right upper quadrant abdominal pain that started early this morning around 5 AM, several hours after eating dinner last night.  Associated with nausea but no vomiting.  No diarrhea, no bowel movement on the day of presentation.  Had a similar episode a few months ago.  Per EDP, has denied any cardiopulmonary symptoms.  History is obtained from EDP and from review of medical records.  At the time of this visit patient is confused and hypersomnolent.  He is arousable to voices but goes right back to sleep.  Unable to obtain a history from the patient.  Called his wife x2, unable to reach his wife.  Right upper quadrant abdominal ultrasound and CT abdomen and pelvis with contrast were equivocal.  EDP consulted general surgery due to suspected acute cholecystitis.  Started on broad-spectrum IV antibiotics empirically.  TRH asked to admit.  ED Course:  Afebrile.  BP 170/72, heart rate 90, respiratory rate 26, O2 saturation 99% on 2 L.  Lab studies remarkable for lipase normal 28.  Liver chemistries normal.  WBC 16,000, hemoglobin 11.4K, platelet count 266K.  4/17-c/o epigastric pain. No n/v. Febrile tmax 100.6    Consultants:   surgery  Procedures:   Antimicrobials:   Metronidazole and cefepime   Subjective: No chest pain no shortness of breath  Objective: Vitals:   11/26/20 2025 11/26/20 2127 11/27/20 0400 11/27/20 0800  BP: (!) 170/72 (!) 170/70 (!) 165/68 (!) 144/65  Pulse: 89 82 80 91  Resp: 20   18  Temp:  98.5 F (36.9 C) 98.6 F (37 C) (!) 100.6 F (38.1 C)  TempSrc:  Oral Oral Oral  SpO2: 99% 98% 98% 91%  Weight:      Height:         Intake/Output Summary (Last 24 hours) at 11/27/2020 0905 Last data filed at 11/27/2020 0538 Gross per 24 hour  Intake 100 ml  Output 50 ml  Net 50 ml   Filed Weights   11/26/20 1511  Weight: 72.6 kg    Examination:  General exam: Appears calm and comfortable  Respiratory system: Clear to auscultation. Respiratory effort normal. Cardiovascular system: S1 & S2 heard, RRR. No JVD, murmurs, rubs, gallops or clicks.  Gastrointestinal system: Abdomen is nondistended, soft and nontender. Normal bowel sounds heard. Central nervous system: Alert and oriented. Grossly intact Extremities: No edema Skin: Warm dry Psychiatry:  Mood & affect appropriate.     Data Reviewed: I have personally reviewed following labs and imaging studies  CBC: Recent Labs  Lab 11/26/20 1513 11/27/20 0445  WBC 16.0* 17.1*  NEUTROABS  --  13.3*  HGB 11.4* 11.6*  HCT 36.4* 35.4*  MCV 90.5 88.5  PLT 266 628   Basic Metabolic Panel: Recent Labs  Lab 11/26/20 1513 11/27/20 0445  NA 135 137  K 4.4 4.2  CL 100 99  CO2 28 28  GLUCOSE 122* 122*  BUN 20 19  CREATININE 1.17 1.15  CALCIUM 8.9 8.9  MG  --  1.6*  PHOS  --  2.6   GFR: Estimated Creatinine Clearance: 42.3 mL/min (by C-G formula based on SCr of 1.15 mg/dL). Liver Function Tests: Recent Labs  Lab  11/26/20 1513 11/27/20 0445  AST 26 23  ALT 21 19  ALKPHOS 65 58  BILITOT 0.8 1.1  PROT 7.5 7.0  ALBUMIN 4.0 3.6   Recent Labs  Lab 11/26/20 1513  LIPASE 28   No results for input(s): AMMONIA in the last 168 hours. Coagulation Profile: No results for input(s): INR, PROTIME in the last 168 hours. Cardiac Enzymes: No results for input(s): CKTOTAL, CKMB, CKMBINDEX, TROPONINI in the last 168 hours. BNP (last 3 results) No results for input(s): PROBNP in the last 8760 hours. HbA1C: No results for input(s): HGBA1C in the last 72 hours. CBG: No results for input(s): GLUCAP in the last 168 hours. Lipid Profile: No results for  input(s): CHOL, HDL, LDLCALC, TRIG, CHOLHDL, LDLDIRECT in the last 72 hours. Thyroid Function Tests: No results for input(s): TSH, T4TOTAL, FREET4, T3FREE, THYROIDAB in the last 72 hours. Anemia Panel: No results for input(s): VITAMINB12, FOLATE, FERRITIN, TIBC, IRON, RETICCTPCT in the last 72 hours. Sepsis Labs: Recent Labs  Lab 11/26/20 1943 11/26/20 2050  LATICACIDVEN 1.3 1.6    Recent Results (from the past 240 hour(s))  Resp Panel by RT-PCR (Flu A&B, Covid) Nasopharyngeal Swab     Status: None   Collection Time: 11/26/20  7:43 PM   Specimen: Nasopharyngeal Swab; Nasopharyngeal(NP) swabs in vial transport medium  Result Value Ref Range Status   SARS Coronavirus 2 by RT PCR NEGATIVE NEGATIVE Final    Comment: (NOTE) SARS-CoV-2 target nucleic acids are NOT DETECTED.  The SARS-CoV-2 RNA is generally detectable in upper respiratory specimens during the acute phase of infection. The lowest concentration of SARS-CoV-2 viral copies this assay can detect is 138 copies/mL. A negative result does not preclude SARS-Cov-2 infection and should not be used as the sole basis for treatment or other patient management decisions. A negative result may occur with  improper specimen collection/handling, submission of specimen other than nasopharyngeal swab, presence of viral mutation(s) within the areas targeted by this assay, and inadequate number of viral copies(<138 copies/mL). A negative result must be combined with clinical observations, patient history, and epidemiological information. The expected result is Negative.  Fact Sheet for Patients:  EntrepreneurPulse.com.au  Fact Sheet for Healthcare Providers:  IncredibleEmployment.be  This test is no t yet approved or cleared by the [place] FDA and  has been authorized for detection and/or diagnosis of SARS-CoV-2 by FDA under an Emergency Use Authorization (EUA). This EUA will remain  in effect  (meaning this test can be used) for the duration of the COVID-19 declaration under Section 564(b)(1) of the Act, 21 U.S.C.section 360bbb-3(b)(1), unless the authorization is terminated  or revoked sooner.       Influenza A by PCR NEGATIVE NEGATIVE Final   Influenza B by PCR NEGATIVE NEGATIVE Final    Comment: (NOTE) The Xpert Xpress SARS-CoV-2/FLU/RSV plus assay is intended as an aid in the diagnosis of influenza from Nasopharyngeal swab specimens and should not be used as a sole basis for treatment. Nasal washings and aspirates are unacceptable for Xpert Xpress SARS-CoV-2/FLU/RSV testing.  Fact Sheet for Patients: EntrepreneurPulse.com.au  Fact Sheet for Healthcare Providers: IncredibleEmployment.be  This test is not yet approved or cleared by the [place] FDA and has been authorized for detection and/or diagnosis of SARS-CoV-2 by FDA under an Emergency Use Authorization (EUA). This EUA will remain in effect (meaning this test can be used) for the duration of the COVID-19 declaration under Section 564(b)(1) of the Act, 21 U.S.C. section 360bbb-3(b)(1), unless the authorization is terminated  or revoked.  Performed at [facility], [address] [place], [place] [address]   CULTURE, BLOOD (ROUTINE X 2) w Reflex to ID Panel     Status: None (Preliminary result)   Collection Time: 11/26/20  8:48 PM   Specimen: BLOOD  Result Value Ref Range Status   Specimen Description BLOOD LEFT AC  Final   Special Requests   Final    BOTTLES DRAWN AEROBIC AND ANAEROBIC Blood Culture adequate volume   Culture   Final    NO GROWTH < 12 HOURS Performed at [facility], [address], [place], [place] [address]    Report Status PENDING  Incomplete  CULTURE, BLOOD (ROUTINE X 2) w Reflex to ID Panel     Status: None (Preliminary result)   Collection Time: 11/26/20  8:49 PM   Specimen: BLOOD  Result Value Ref Range Status    Specimen Description BLOOD LEFT HAND  Final   Special Requests   Final    BOTTLES DRAWN AEROBIC AND ANAEROBIC Blood Culture results may not be optimal due to an inadequate volume of blood received in culture bottles   Culture   Final    NO GROWTH < 12 HOURS Performed at [facility], [address], [place], [place] [address]    Report Status PENDING  Incomplete         Radiology Studies: CT ABDOMEN PELVIS W CONTRAST  Result Date: 11/26/2020 CLINICAL DATA:  Epigastric pain. EXAM: CT ABDOMEN AND PELVIS WITH CONTRAST TECHNIQUE: Multidetector CT imaging of the abdomen and pelvis was performed using the standard protocol following bolus administration of intravenous contrast. CONTRAST:  168mL OMNIPAQUE IOHEXOL 300 MG/ML  SOLN COMPARISON:  CT abdomen pelvis 09/13/2020, CT abdomen pelvis 05/04/2013 FINDINGS: Lower chest: Bilateral lower lobe subsegmental atelectasis. 8 mm nodular-like triangular subpleural density within the lingula. Hepatobiliary: No focal liver abnormality. No gallstones. Question borderline thickened gallbladder wall thickening and trace pericholecystic fluid. No biliary dilatation. Pancreas: No focal lesion. Normal pancreatic contour. No surrounding inflammatory changes. No main pancreatic ductal dilatation. Spleen: Normal in size without focal abnormality. Adrenals/Urinary Tract: No adrenal nodule bilaterally. Bilateral kidneys enhance symmetrically. 2.7 cm fluid density lesion within left kidney likely represents a simple renal cyst. No hydronephrosis. No hydroureter. The urinary bladder is unremarkable. Stomach/Bowel: Stomach is within normal limits. Second portion of the duodenum diverticula again noted. No evidence of bowel wall thickening or dilatation. Appendix appears normal. Vascular/Lymphatic: The portal, splenic, superior mesenteric veins are patent. No abdominal aorta or iliac aneurysm. Severe atherosclerotic plaque of the aorta and its branches. No abdominal,  pelvic, or inguinal lymphadenopathy. Reproductive: The prostate is enlarged measuring up to 6.4 cm. Other: No large volume intraperitoneal free fluid. No intraperitoneal free gas. No organized fluid collection. Musculoskeletal: No abdominal wall hernia or abnormality. No suspicious lytic or blastic osseous lesions. No acute displaced fracture. Multilevel degenerative changes of the spine with intervertebral disc space vacuum phenomenon endplate sclerosis at the L4-L5 and L5-S1 levels. Grade 1-2 anterolisthesis of L5 on S1. IMPRESSION: 1. Question borderline thickened gallbladder wall thickening and trace pericholecystic fluid. Recommend right upper quadrant ultrasound for a more sensitive evaluation of the gallbladder. 2. Prostatomegaly. 3. Severe degenerative changes of the L4 through S1 levels with grade 1-2 anterolisthesis of L5 on S1. 4.  Aortic Atherosclerosis (ICD10-I70.0). 5. An 8 mm nodular-like subpleural triangular density within the lingula that likely represents an intrapulmonary lymph node. Non-contrast chest CT at 6-12 months is recommended. If the nodule is stable  at time of repeat CT, then future CT at 18-24 months (from today's scan) is considered optional for low-risk patients, but is recommended for high-risk patients. This recommendation follows the consensus statement: Guidelines for Management of Incidental Pulmonary Nodules Detected on CT Images: From the Fleischner Society 2017; Radiology 2017; 284:228-243. Electronically Signed   By: Iven Finn M.D.   On: 11/26/2020 17:03   US Abdomen Limited RUQ (LIVER/GB)  Result Date: 11/26/2020 CLINICAL DATA:  Epigastric pain for 1 day. EXAM: ULTRASOUND ABDOMEN LIMITED RIGHT UPPER QUADRANT COMPARISON:  None. FINDINGS: Gallbladder: The gallbladder is contracted which limits evaluation. No gallbladder wall thickening. There is a calculus measuring 0.9 cm. Negative sonographic Murphy sign. Common bile duct: Diameter: 0.6 cm, within normal limits.  Liver: No focal lesion identified. Within normal limits in parenchymal echogenicity. Portal vein is patent on color Doppler imaging with normal direction of blood flow towards the liver. Other: None. IMPRESSION: The gallbladder is contracted which limits evaluation. Cholelithiasis without other evidence of acute cholecystitis. Electronically Signed   By: Audie Pinto M.D.   On: 11/26/2020 18:17        Scheduled Meds: . aspirin EC  81 mg Oral Daily  . enoxaparin (LOVENOX) injection  40 mg Subcutaneous Q24H  . metoprolol succinate  25 mg Oral Daily  . mometasone-formoterol  2 puff Inhalation BID  . pantoprazole  40 mg Oral Daily  . simvastatin  40 mg Oral QPM  . tamsulosin  0.4 mg Oral Daily   Continuous Infusions: . ceFEPime (MAXIPIME) IV    . lactated ringers 50 mL/hr at 11/27/20 0538  . metronidazole Stopped (11/27/20 4193)    Assessment & Plan:   Active Problems:   Acute cholecystitis   Presumed acute cholecystitis Presented with sudden onset right upper quadrant abdominal pain associated with nausea.  Had similar episode months ago. Right upper quadrant ultrasound and CT scan abdomen and pelvis with contrast were equivocal. 4/17- gsx input appreciated appreciated, will plan on obtaining HIDA scan.  Continue IV antibiotics Follow-up blood cultures Check lipase as he also complaining of epigastric pain   Acute metabolic encephalopathy, unclear etiology Improved.  He was alert and oriented.  Responded appropriately Continue to monitor    Acute hypoxic respiratory failure, unclear etiology. Unclear etiology in the setting of hypersomnolence O2 saturation 86% on room air, 99% on 2 L. 4/17- obtain cxr   Coronary artery disease On asa , statin, beta blk No cp   Essential hypertension Iv lopressor prn Add home lisinopril 2.5mg  qd, increase as tolerated  Hypomagnesium  Mg 1.6 Will give iv mag. 2gm Ck am lab  Polyneuropathy Hold off home gabapentin for  now due to hypersomnolence.  Hyperlipidemia Continue statin  GERD  Protonix 40 mg daily.  BPH Continue flomax  COPD Not on oxygen supplementation at baseline. Resume home regimen.     DVT prophylaxis: Lovenox Code Status: Full Family Communication: Called spouse and unable to leave a message  Status is: Inpatient  Remains inpatient appropriate because:Inpatient level of care appropriate due to severity of illness   Dispo:  Patient From: Home  Planned Disposition: Home  Medically stable for discharge: No              LOS: 1 day   Time spent: 45 minutes with more than 50% COC    Nolberto Hanlon, MD [facility] Pager [phone]  If 7PM-7AM, please contact night-coverage 11/27/2020, 9:05 AM

## 2020-11-28 ENCOUNTER — Inpatient Hospital Stay: Payer: PPO

## 2020-11-28 ENCOUNTER — Encounter: Payer: Self-pay | Admitting: Internal Medicine

## 2020-11-28 DIAGNOSIS — J9601 Acute respiratory failure with hypoxia: Secondary | ICD-10-CM

## 2020-11-28 LAB — CBC
HCT: 36 % — ABNORMAL LOW (ref 39.0–52.0)
Hemoglobin: 11.6 g/dL — ABNORMAL LOW (ref 13.0–17.0)
MCH: 29 pg (ref 26.0–34.0)
MCHC: 32.2 g/dL (ref 30.0–36.0)
MCV: 90 fL (ref 80.0–100.0)
Platelets: 269 10*3/uL (ref 150–400)
RBC: 4 MIL/uL — ABNORMAL LOW (ref 4.22–5.81)
RDW: 14.3 % (ref 11.5–15.5)
WBC: 18.2 10*3/uL — ABNORMAL HIGH (ref 4.0–10.5)
nRBC: 0 % (ref 0.0–0.2)

## 2020-11-28 LAB — URINE CULTURE: Culture: NO GROWTH

## 2020-11-28 LAB — MAGNESIUM: Magnesium: 2 mg/dL (ref 1.7–2.4)

## 2020-11-28 MED ORDER — SODIUM CHLORIDE 0.9 % IV SOLN
2.0000 g | Freq: Two times a day (BID) | INTRAVENOUS | Status: DC
  Administered 2020-11-28 – 2020-11-29 (×3): 2 g via INTRAVENOUS
  Filled 2020-11-28 (×4): qty 2

## 2020-11-28 MED ORDER — TECHNETIUM TC 99M MEBROFENIN IV KIT
5.0000 | PACK | Freq: Once | INTRAVENOUS | Status: AC | PRN
  Administered 2020-11-28: 5.525 via INTRAVENOUS

## 2020-11-28 NOTE — Progress Notes (Signed)
PROGRESS NOTE    Leon Estes  AST:[id number] DOB: [birth year] DOA: 11/26/2020 PCP: Venia Carbon, MD    Brief Narrative:  Leon Estes is a 87 y.o. male with medical history significant for coronary artery disease, COPD, essential hypertension, polyneuropathy, hyperlipidemia, BPH who presented to [facility] ED with complaints of sudden onset right upper quadrant abdominal pain that started early this morning around 5 AM, several hours after eating dinner last night.  Associated with nausea but no vomiting.  No diarrhea, no bowel movement on the day of presentation.  Had a similar episode a few months ago.  Per EDP, has denied any cardiopulmonary symptoms.  History is obtained from EDP and from review of medical records.  At the time of this visit patient is confused and hypersomnolent.  He is arousable to voices but goes right back to sleep.  Unable to obtain a history from the patient.  Called his wife x2, unable to reach his wife.  Right upper quadrant abdominal ultrasound and CT abdomen and pelvis with contrast were equivocal.  EDP consulted general surgery due to suspected acute cholecystitis.  Started on broad-spectrum IV antibiotics empirically.  TRH asked to admit.  ED Course:  Afebrile.  BP 170/72, heart rate 90, respiratory rate 26, O2 saturation 99% on 2 L.  Lab studies remarkable for lipase normal 28.  Liver chemistries normal.  WBC 16,000, hemoglobin 11.4K, platelet count 266K.  4/17-c/o epigastric pain. No n/v. Febrile tmax 100.6 4/18- HIDA scan done today   Consultants:   surgery  Procedures:   Antimicrobials:   Metronidazole and cefepime   Subjective: Still with abd pain, no n/v  Objective: Vitals:   11/27/20 1951 11/28/20 0409 11/28/20 0831 11/28/20 1146  BP: (!) 158/60 136/64 137/75 119/62  Pulse: 90 87 65 79  Resp:  19 18 18   Temp: 98 F (36.7 C) 98.3 F (36.8 C) 98.2 F (36.8 C) 97.9 F (36.6 C)  TempSrc: Oral  Oral Oral  SpO2: 96% 96% 97% 94%   Weight:      Height:        Intake/Output Summary (Last 24 hours) at 11/28/2020 1509 Last data filed at 11/28/2020 1239 Gross per 24 hour  Intake 1078.14 ml  Output 300 ml  Net 778.14 ml   Filed Weights   11/26/20 1511  Weight: 72.6 kg    Examination:  Nad, calm cta no w/r/r Rrr, s1/s2 Soft benign mild ttp at epigastric +bs No edema Awake and alert, grossly intact    Data Reviewed: I have personally reviewed following labs and imaging studies  CBC: Recent Labs  Lab 11/26/20 1513 11/27/20 0445 11/28/20 0536  WBC 16.0* 17.1* 18.2*  NEUTROABS  --  13.3*  --   HGB 11.4* 11.6* 11.6*  HCT 36.4* 35.4* 36.0*  MCV 90.5 88.5 90.0  PLT 266 267 989   Basic Metabolic Panel: Recent Labs  Lab 11/26/20 1513 11/27/20 0445 11/28/20 0536  NA 135 137  --   K 4.4 4.2  --   CL 100 99  --   CO2 28 28  --   GLUCOSE 122* 122*  --   BUN 20 19  --   CREATININE 1.17 1.15  --   CALCIUM 8.9 8.9  --   MG  --  1.6* 2.0  PHOS  --  2.6  --    GFR: Estimated Creatinine Clearance: 42.3 mL/min (by C-G formula based on SCr of 1.15 mg/dL). Liver Function Tests: Recent Labs  Lab  11/26/20 1513 11/27/20 0445  AST 26 23  ALT 21 19  ALKPHOS 65 58  BILITOT 0.8 1.1  PROT 7.5 7.0  ALBUMIN 4.0 3.6   Recent Labs  Lab 11/26/20 1513 11/27/20 0445  LIPASE 28 24   No results for input(s): AMMONIA in the last 168 hours. Coagulation Profile: No results for input(s): INR, PROTIME in the last 168 hours. Cardiac Enzymes: No results for input(s): CKTOTAL, CKMB, CKMBINDEX, TROPONINI in the last 168 hours. BNP (last 3 results) No results for input(s): PROBNP in the last 8760 hours. HbA1C: No results for input(s): HGBA1C in the last 72 hours. CBG: No results for input(s): GLUCAP in the last 168 hours. Lipid Profile: No results for input(s): CHOL, HDL, LDLCALC, TRIG, CHOLHDL, LDLDIRECT in the last 72 hours. Thyroid Function Tests: No results for input(s): TSH, T4TOTAL, FREET4, T3FREE,  THYROIDAB in the last 72 hours. Anemia Panel: No results for input(s): VITAMINB12, FOLATE, FERRITIN, TIBC, IRON, RETICCTPCT in the last 72 hours. Sepsis Labs: Recent Labs  Lab 11/26/20 1943 11/26/20 2050  LATICACIDVEN 1.3 1.6    Recent Results (from the past 240 hour(s))  Resp Panel by RT-PCR (Flu A&B, Covid) Nasopharyngeal Swab     Status: None   Collection Time: 11/26/20  7:43 PM   Specimen: Nasopharyngeal Swab; Nasopharyngeal(NP) swabs in vial transport medium  Result Value Ref Range Status   SARS Coronavirus 2 by RT PCR NEGATIVE NEGATIVE Final    Comment: (NOTE) SARS-CoV-2 target nucleic acids are NOT DETECTED.  The SARS-CoV-2 RNA is generally detectable in upper respiratory specimens during the acute phase of infection. The lowest concentration of SARS-CoV-2 viral copies this assay can detect is 138 copies/mL. A negative result does not preclude SARS-Cov-2 infection and should not be used as the sole basis for treatment or other patient management decisions. A negative result may occur with  improper specimen collection/handling, submission of specimen other than nasopharyngeal swab, presence of viral mutation(s) within the areas targeted by this assay, and inadequate number of viral copies(<138 copies/mL). A negative result must be combined with clinical observations, patient history, and epidemiological information. The expected result is Negative.  Fact Sheet for Patients:  EntrepreneurPulse.com.au  Fact Sheet for Healthcare Providers:  IncredibleEmployment.be  This test is no t yet approved or cleared by the [place] FDA and  has been authorized for detection and/or diagnosis of SARS-CoV-2 by FDA under an Emergency Use Authorization (EUA). This EUA will remain  in effect (meaning this test can be used) for the duration of the COVID-19 declaration under Section 564(b)(1) of the Act, 21 U.S.C.section 360bbb-3(b)(1), unless the  authorization is terminated  or revoked sooner.       Influenza A by PCR NEGATIVE NEGATIVE Final   Influenza B by PCR NEGATIVE NEGATIVE Final    Comment: (NOTE) The Xpert Xpress SARS-CoV-2/FLU/RSV plus assay is intended as an aid in the diagnosis of influenza from Nasopharyngeal swab specimens and should not be used as a sole basis for treatment. Nasal washings and aspirates are unacceptable for Xpert Xpress SARS-CoV-2/FLU/RSV testing.  Fact Sheet for Patients: EntrepreneurPulse.com.au  Fact Sheet for Healthcare Providers: IncredibleEmployment.be  This test is not yet approved or cleared by the [place] FDA and has been authorized for detection and/or diagnosis of SARS-CoV-2 by FDA under an Emergency Use Authorization (EUA). This EUA will remain in effect (meaning this test can be used) for the duration of the COVID-19 declaration under Section 564(b)(1) of the Act, 21 U.S.C. section 360bbb-3(b)(1), unless the  authorization is terminated or revoked.  Performed at [facility], [address] [place], [place] [address]   Urine Culture     Status: None   Collection Time: 11/26/20  8:25 PM   Specimen: Urine, Random  Result Value Ref Range Status   Specimen Description   Final    URINE, RANDOM Performed at [facility], [address], [place], [place] [address]    Special Requests   Final    NONE Performed at [facility], [address] [place], [place] [address]    Culture   Final    NO GROWTH Performed at [facility], [place] [address], [place], [place] [address]    Report Status 11/28/2020 FINAL  Final  CULTURE, BLOOD (ROUTINE X 2) w Reflex to ID Panel     Status: None (Preliminary result)   Collection Time: 11/26/20  8:48 PM   Specimen: BLOOD  Result Value Ref Range Status   Specimen Description BLOOD LEFT [facility]  Final   Special Requests   Final    BOTTLES DRAWN AEROBIC AND ANAEROBIC Blood  Culture adequate volume   Culture   Final    NO GROWTH 2 DAYS Performed at [facility], [address], [place], [place] [address]    Report Status PENDING  Incomplete  CULTURE, BLOOD (ROUTINE X 2) w Reflex to ID Panel     Status: None (Preliminary result)   Collection Time: 11/26/20  8:49 PM   Specimen: BLOOD  Result Value Ref Range Status   Specimen Description BLOOD LEFT HAND  Final   Special Requests   Final    BOTTLES DRAWN AEROBIC AND ANAEROBIC Blood Culture results may not be optimal due to an inadequate volume of blood received in culture bottles   Culture   Final    NO GROWTH 2 DAYS Performed at [facility], [address], [place],  [address]    Report Status PENDING  Incomplete         Radiology Studies: NM Hepatobiliary Liver Func  Result Date: 11/28/2020 CLINICAL DATA:  Upper abdominal pain.  Cholelithiasis. EXAM: NUCLEAR MEDICINE HEPATOBILIARY IMAGING TECHNIQUE: Sequential images of the abdomen were obtained out to 60 minutes following intravenous administration of radiopharmaceutical. Patient refused continued imaging with morphine administration due to shortness of breath. RADIOPHARMACEUTICALS:  5.5 mCi Tc-84m  Choletec IV COMPARISON:  None. FINDINGS: Prompt uptake and biliary excretion of activity by the liver is seen. Biliary activity passes into small bowel, consistent with patent common bile duct. However, no gallbladder activity is seen. IMPRESSION: Absence of gallbladder activity, consistent with cystic duct obstruction. Note that patient refused additional imaging with morphine administration. No evidence of biliary obstruction. Electronically Signed   By: Marlaine Hind M.D.   On: 11/28/2020 11:46   CT ABDOMEN PELVIS W CONTRAST  Result Date: 11/26/2020 CLINICAL DATA:  Epigastric pain. EXAM: CT ABDOMEN AND PELVIS WITH CONTRAST TECHNIQUE: Multidetector CT imaging of the abdomen and pelvis was performed using the standard protocol  following bolus administration of intravenous contrast. CONTRAST:  165mL OMNIPAQUE IOHEXOL 300 MG/ML  SOLN COMPARISON:  CT abdomen pelvis 09/13/2020, CT abdomen pelvis 05/04/2013 FINDINGS: Lower chest: Bilateral lower lobe subsegmental atelectasis. 8 mm nodular-like triangular subpleural density within the lingula. Hepatobiliary: No focal liver abnormality. No gallstones. Question borderline thickened gallbladder wall thickening and trace pericholecystic fluid. No biliary dilatation. Pancreas: No focal lesion. Normal pancreatic contour. No surrounding inflammatory changes. No main pancreatic ductal dilatation. Spleen: Normal in  size without focal abnormality. Adrenals/Urinary Tract: No adrenal nodule bilaterally. Bilateral kidneys enhance symmetrically. 2.7 cm fluid density lesion within left kidney likely represents a simple renal cyst. No hydronephrosis. No hydroureter. The urinary bladder is unremarkable. Stomach/Bowel: Stomach is within normal limits. Second portion of the duodenum diverticula again noted. No evidence of bowel wall thickening or dilatation. Appendix appears normal. Vascular/Lymphatic: The portal, splenic, superior mesenteric veins are patent. No abdominal aorta or iliac aneurysm. Severe atherosclerotic plaque of the aorta and its branches. No abdominal, pelvic, or inguinal lymphadenopathy. Reproductive: The prostate is enlarged measuring up to 6.4 cm. Other: No large volume intraperitoneal free fluid. No intraperitoneal free gas. No organized fluid collection. Musculoskeletal: No abdominal wall hernia or abnormality. No suspicious lytic or blastic osseous lesions. No acute displaced fracture. Multilevel degenerative changes of the spine with intervertebral disc space vacuum phenomenon endplate sclerosis at the L4-L5 and L5-S1 levels. Grade 1-2 anterolisthesis of L5 on S1. IMPRESSION: 1. Question borderline thickened gallbladder wall thickening and trace pericholecystic fluid. Recommend right  upper quadrant ultrasound for a more sensitive evaluation of the gallbladder. 2. Prostatomegaly. 3. Severe degenerative changes of the L4 through S1 levels with grade 1-2 anterolisthesis of L5 on S1. 4.  Aortic Atherosclerosis (ICD10-I70.0). 5. An 8 mm nodular-like subpleural triangular density within the lingula that likely represents an intrapulmonary lymph node. Non-contrast chest CT at 6-12 months is recommended. If the nodule is stable at time of repeat CT, then future CT at 18-24 months (from today's scan) is considered optional for low-risk patients, but is recommended for high-risk patients. This recommendation follows the consensus statement: Guidelines for Management of Incidental Pulmonary Nodules Detected on CT Images: From the Fleischner Society 2017; Radiology 2017; 284:228-243. Electronically Signed   By: Iven Finn M.D.   On: 11/26/2020 17:03   DG Chest Port 1 View  Result Date: 11/27/2020 CLINICAL DATA:  Hypoxia EXAM: PORTABLE CHEST 1 VIEW COMPARISON:  09/12/2020 FINDINGS: Cardiac shadow is within normal limits. Aortic calcifications are again seen. The lungs are well aerated bilaterally. Mild increased bronchitic markings are noted without focal infiltrate. No bony abnormality is seen. IMPRESSION: Mild increased bronchitic markings. Electronically Signed   By: Inez Catalina M.D.   On: 11/27/2020 17:19   US Abdomen Limited RUQ (LIVER/GB)  Result Date: 11/26/2020 CLINICAL DATA:  Epigastric pain for 1 day. EXAM: ULTRASOUND ABDOMEN LIMITED RIGHT UPPER QUADRANT COMPARISON:  None. FINDINGS: Gallbladder: The gallbladder is contracted which limits evaluation. No gallbladder wall thickening. There is a calculus measuring 0.9 cm. Negative sonographic Murphy sign. Common bile duct: Diameter: 0.6 cm, within normal limits. Liver: No focal lesion identified. Within normal limits in parenchymal echogenicity. Portal vein is patent on color Doppler imaging with normal direction of blood flow towards the  liver. Other: None. IMPRESSION: The gallbladder is contracted which limits evaluation. Cholelithiasis without other evidence of acute cholecystitis. Electronically Signed   By: Audie Pinto M.D.   On: 11/26/2020 18:17        Scheduled Meds: . aspirin EC  81 mg Oral Daily  . enoxaparin (LOVENOX) injection  40 mg Subcutaneous Q24H  . lisinopril  2.5 mg Oral Daily  . metoprolol succinate  25 mg Oral Daily  . mometasone-formoterol  2 puff Inhalation BID  . pantoprazole  40 mg Oral Daily  . simvastatin  40 mg Oral QPM  . tamsulosin  0.4 mg Oral Daily   Continuous Infusions: . ceFEPime (MAXIPIME) IV 2 g (11/28/20 1239)  . lactated ringers 50 mL/hr at  11/27/20 0538  . metronidazole 500 mg (11/28/20 0354)    Assessment & Plan:   Active Problems:   Acute cholecystitis   Presumed acute cholecystitis Presented with sudden onset right upper quadrant abdominal pain associated with nausea.  Had similar episode months ago. Right upper quadrant ultrasound and CT scan abdomen and pelvis with contrast were equivocal. 4/18-HIDA completed General surgery following Patient will likely avoid surgery Has started him on clear liquids and advance to low-fat/no fat diet Continue IV antibiotics     Acute metabolic encephalopathy, unclear etiology, likely from above Improved      Acute hypoxic respiratory failure, unclear etiology. Unclear etiology in the setting of hypersomnolence O2 saturation 86% on room air, 99% on 2 L. 4/18 chest x-ray from yesterday with mild increased bronchitic markings IS  Coronary artery disease No chest pain  Continue aspirin, statin, beta-blockers     Essential hypertension Stable continue lisinopril and Lopressor IV as needed   Hypomagnesium  Replaced, mag 2   Polyneuropathy Hold off home gabapentin for now due to hypersomnolence.  Hyperlipidemia Continue statin  GERD  Protonix 40 mg daily.  BPH Continue flomax  COPD Not on  oxygen supplementation at baseline. Resume home regimen.     DVT prophylaxis: Lovenox Code Status: Full Family Communication: Wife at bedside updated  Status is: Inpatient  Remains inpatient appropriate because:Inpatient level of care appropriate due to severity of illness   Dispo:  Patient From: Home  Planned Disposition: Home  Medically stable for discharge: No              LOS: 2 days   Time spent: 35 minutes with more than 50% COC    Nolberto Hanlon, MD [facility] Pager [phone]  If 7PM-7AM, please contact night-coverage 11/28/2020, 3:09 PM

## 2020-11-28 NOTE — TOC Initial Note (Signed)
Transition of Care [facility]) - Initial/Assessment Note    Patient Details  Name: Leon Estes MRN: [id number] Date of Birth: 02-15-1931  Transition of Care [facility]) CM/SW Contact:    Eileen Stanford, LCSW Phone Number: 11/28/2020, 3:49 PM  Clinical Narrative:    PT is recommending [place] however, pt ask that CSW talk to his spouse about it. Pt's spouse presented at bedside. Pt's spouse is declining HH stating she doesn't think pt needs HH. She states pt is moving around fine. No additional needs at this time.                Expected Discharge Plan: Home/Self Care Barriers to Discharge: Continued Medical Work up   Patient Goals and CMS Choice Patient states their goals for this hospitalization and ongoing recovery are:: to go home   Choice offered to / list presented to : [facility]  Expected Discharge Plan and Services Expected Discharge Plan: Home/Self Care In-house Referral: NA   Post Acute Care Choice: NA Living arrangements for the past 2 months: Single Family Home                                      Prior Living Arrangements/Services Living arrangements for the past 2 months: Single Family Home Lives with:: Spouse Patient language and need for interpreter reviewed:: Yes Do you feel safe going back to the place where you live?: Yes      Need for Family Participation in Patient Care: Yes (Comment) Care giver support system in place?: Yes (comment)   Criminal Activity/Legal Involvement Pertinent to Current Situation/Hospitalization: No - Comment as needed  Activities of Daily Living Home Assistive Devices/Equipment: Walker (specify type) ADL Screening (condition at time of admission) Patient's cognitive ability adequate to safely complete daily activities?: No Is the patient deaf or have difficulty hearing?: Yes Does the patient have difficulty seeing, even when wearing glasses/contacts?: Yes Does the patient have difficulty concentrating, remembering, or making  decisions?: Yes Patient able to express need for assistance with ADLs?: Yes Does the patient have difficulty dressing or bathing?: Yes Independently performs ADLs?: No Communication: Independent Dressing (OT): Needs assistance Is this a change from baseline?: Pre-admission baseline Grooming: Needs assistance Is this a change from baseline?: Pre-admission baseline Feeding: Independent Is this a change from baseline?: Pre-admission baseline Bathing: Needs assistance Is this a change from baseline?: Pre-admission baseline Toileting: Needs assistance Is this a change from baseline?: Pre-admission baseline In/Out Bed: Needs assistance Is this a change from baseline?: Pre-admission baseline Walks in Home: Needs assistance Is this a change from baseline?: Pre-admission baseline Does the patient have difficulty walking or climbing stairs?: Yes Weakness of Legs: Both Weakness of Arms/Hands: Both  Permission Sought/Granted Permission sought to share information with : Family Supports Permission granted to share information with : Yes, Verbal Permission Granted  Share Information with NAME: Leon Estes     Permission granted to share info w Relationship: spouse     Emotional Assessment Appearance:: Appears stated age Attitude/Demeanor/Rapport: Engaged Affect (typically observed): Accepting Orientation: : Oriented to Self,Oriented to Place,Oriented to  Time,Oriented to Situation Alcohol / Substance Use: Not Applicable Psych Involvement: No (comment)  Admission diagnosis:  Acute cholecystitis [K81.0] Epigastric pain [R10.13] Patient Active Problem List   Diagnosis Date Noted  . Acute cholecystitis 11/26/2020  . Preventative health care 10/12/2020  . Macular degeneration, wet ([place]) 10/12/2020  . Diarrhea 09/08/2020  . Acquired claw  toe, left 02/02/2020  . BPH (benign prostatic hyperplasia) 01/13/2020  . Neuropathy 02/18/2018  . COPD (chronic obstructive pulmonary disease) ([place])   .  Chronic renal disease, stage III ([place])   . Benign essential HTN 04/09/2017  . CAD (coronary artery disease) 03/03/2015  . Hyperlipidemia 03/03/2015   PCP:  Venia Carbon, MD Pharmacy:   [facility], [place] [address], [place] [address] Phone: [phone] Fax: [phone]  [facility], [place] [address] [place] [address] Phone: [phone] Fax: [phone]     Social Determinants of Health (SDOH) Interventions    Readmission Risk Interventions No flowsheet data found.

## 2020-11-28 NOTE — Plan of Care (Signed)

## 2020-11-28 NOTE — Progress Notes (Signed)
[facility] SURGICAL PROGRESS NOTE (cpt [phone])  Hospital Day(s): 2.   Interval History: Patient seen and examined, no acute events or new complaints overnight. Patient reports he overall feels better but still having some right sided soreness, denies fever, chills, nausea, emesis. Leukocytosis slightly worse this morning to 18.2K. He is currently NPO and awaiting HIDA for further evaluation of his gallbladder. Currently on Cefepime and Flagyl.   Review of Systems:  Constitutional: denies fever, chills  HEENT: denies cough or congestion  Respiratory: denies any shortness of breath  Cardiovascular: denies chest pain or palpitations  Gastrointestinal: + abdominal pain (mild, improved), denied N/V, or diarrhea/and bowel function as per interval history Genitourinary: denies burning with urination or urinary frequency  Vital signs in last 24 hours: [min-max] current  Temp:  [97.5 F (36.4 C)-100.6 F (38.1 C)] 98.3 F (36.8 C) (04/18 0409) Pulse Rate:  [87-94] 87 (04/18 0409) Resp:  [16-19] 19 (04/18 0409) BP: (136-163)/(60-67) 136/64 (04/18 0409) SpO2:  [91 %-96 %] 96 % (04/18 0409)     Height: 5\' 7"  (170.2 cm) Weight: 72.6 kg BMI (Calculated): 25.05   Intake/Output last 2 shifts:  04/17 0701 - 04/18 0700 In: 1078.1 [I.V.:930.8; IV Piggyback:147.3] Out: -    Physical Exam:  Constitutional: alert, cooperative and no distress  HENT: normocephalic without obvious abnormality  Eyes: PERRL, EOM's grossly intact and symmetric  Respiratory: breathing non-labored at rest  Cardiovascular: regular rate and sinus rhythm  Gastrointestinal: soft, RUQ pain, and non-distended, no rebound/guarding Musculoskeletal: no edema or wounds, motor and sensation grossly intact, NT    Labs:  CBC Latest Ref Rng & Units 11/28/2020 11/27/2020 11/26/2020  WBC 4.0 - 10.5 K/uL 18.2(H) 17.1(H) 16.0(H)  Hemoglobin 13.0 - 17.0 g/dL 11.6(L) 11.6(L) 11.4(L)  Hematocrit 39.0 - 52.0 % 36.0(L) 35.4(L)  36.4(L)  Platelets 150 - 400 K/uL 269 267 266   CMP Latest Ref Rng & Units 11/27/2020 11/26/2020 09/13/2020  Glucose 70 - 99 mg/dL 122(H) 122(H) 124(H)  BUN 8 - 23 mg/dL 19 20 40(H)  Creatinine 0.61 - 1.24 mg/dL 1.15 1.17 1.41(H)  Sodium 135 - 145 mmol/L 137 135 140  Potassium 3.5 - 5.1 mmol/L 4.2 4.4 4.1  Chloride 98 - 111 mmol/L 99 100 105  CO2 22 - 32 mmol/L 28 28 25   Calcium 8.9 - 10.3 mg/dL 8.9 8.9 9.1  Total Protein 6.5 - 8.1 g/dL 7.0 7.5 -  Total Bilirubin 0.3 - 1.2 mg/dL 1.1 0.8 -  Alkaline Phos 38 - 126 U/L 58 65 -  AST 15 - 41 U/L 23 26 -  ALT 0 - 44 U/L 19 21 -     Imaging studies: No new pertinent imaging studies; HIDA pending   Assessment/Plan: (ICD-10's: K81.0) 87 y.o. male with improved but persistent abdominal pain, found to have 9 mm stone in very contracted gallbladder and equivocal work up for cholecystitis, complicated by numerous pertinent comorbidities.   - He seems to be doing better with IV Abx alone, and he continues to wish to defer any surgical intervention if needed. We will continue with plan for HIDA evaluation today.   - NPO for imaging; pending results may be able to resume diet  - Continue IVF support   - Continue IV ABx (Cefepime + Flagyl)  - Monitor abdominal examination  - Pain control prn; antiemetics prn   - Monitor leukocytosis  - Further management per primary service; we will follow    All of the above findings and recommendations were  discussed with the patient, and the medical team, and all of patient's questions were answered to his expressed satisfaction.  -- Edison Simon, PA-C [facility] 11/28/2020, 7:07 AM [phone] M-F: 7am - 4pm

## 2020-11-28 NOTE — Progress Notes (Signed)
PHARMACY NOTE:  ANTIMICROBIAL RENAL DOSAGE ADJUSTMENT  Current antimicrobial regimen includes a mismatch between antimicrobial dosage and estimated renal function.  As per policy approved by the Pharmacy & Therapeutics and Medical Executive Committees, the antimicrobial dosage will be adjusted accordingly.  Current antimicrobial dosage:  Cefepime 2 g IV q24h  Indication: Intra-abdominal infection  Renal Function:  Estimated Creatinine Clearance: 42.3 mL/min (by C-G formula based on SCr of 1.15 mg/dL).    Antimicrobial dosage has been changed to:  Cefepime 2 g IV q12h  Thank you for allowing pharmacy to be a part of this patient's care.  Benita Gutter, [facility] 11/28/2020 10:08 AM

## 2020-11-28 NOTE — Progress Notes (Signed)
Physical Therapy Treatment Patient Details Name: Leon Estes MRN: [id number] DOB: 1930/10/06 Today's Date: 11/28/2020    History of Present Illness Pt is a 87 y.o. male with medical history significant for coronary artery disease, COPD, essential hypertension, CKD, appendectomy, hearing loss, macular degeneration, polyneuropathy, hyperlipidemia, BPH who presented to [facility] ED with complaints of sudden onset right upper quadrant abdominal pain, suspect acaute cholecystitis. Also with some hypersomnolence this admission. Pt to undergo [place] for gall bladder evaluation.    PT Comments    Patient alert, agreeable to PT. Pt was able to perform bed mobility modI, and sit <> Stand without AD. Posterior LOB noted, minA to correct and pt appeared to be slightly more unsteady without UE compared to last session. He was able to use the RW and CGA and ambulate >178ft, no further LOB noted. Pt spO2 >90% during and after ambulation, up in chair with all needs in reach. The patient would benefit from further skilled PT intervention to continue to maximize safety, mobility, and independence, recommendation remains appropriate.      Follow Up Recommendations  Home health PT;Supervision for mobility/OOB     Equipment Recommendations  None recommended by PT;Other (comment) (pt said he has a RW at home)    Recommendations for Other Services       Precautions / Restrictions Precautions Precautions: Fall Restrictions Weight Bearing Restrictions: No    Mobility  Bed Mobility Overal bed mobility: Modified Independent                  Transfers Overall transfer level: Needs assistance Equipment used: None Transfers: Sit to/from Stand Sit to Stand: Min guard;Min assist         General transfer comment: for first sit <> stand minA for steadying due to some poterior lean noted. further attempts pt able to perform with CGA  Ambulation/Gait Ambulation/Gait assistance: Min guard;Min  assist Gait Distance (Feet): 130 Feet Assistive device: Rolling walker (2 wheeled);None       General Gait Details: RW used due to more unsteadiness noted in standing, able to ambulate further with RW support as well. spO2 WFLs   Stairs             Wheelchair Mobility    Modified Rankin (Stroke Patients Only)       Balance Overall balance assessment: Needs assistance Sitting-balance support: Feet supported Sitting balance-Leahy Scale: Good     Standing balance support: Bilateral upper extremity supported Standing balance-Leahy Scale: Fair Standing balance comment: one instance of retropulsion upon initial stand requiring assist to correct, otherwise G static standing with UE support                            Cognition Arousal/Alertness: Awake/alert Behavior During Therapy: WFL for tasks assessed/performed Overall Cognitive Status: Within Functional Limits for tasks assessed                                 General Comments: HOH      Exercises      General Comments        Pertinent Vitals/Pain Pain Assessment: Faces Faces Pain Scale: No hurt    Home Living                      Prior Function            PT Goals (current  goals can now be found in the care plan section) Progress towards PT goals: Progressing toward goals    Frequency    Min 2X/week      PT Plan Current plan remains appropriate    Co-evaluation              AM-PAC PT "6 Clicks" Mobility   Outcome Measure  Help needed turning from your back to your side while in a flat bed without using bedrails?: None Help needed moving from lying on your back to sitting on the side of a flat bed without using bedrails?: None Help needed moving to and from a bed to a chair (including a wheelchair)?: None Help needed standing up from a chair using your arms (e.g., wheelchair or bedside chair)?: A Little Help needed to walk in hospital room?: A  Little Help needed climbing 3-5 steps with a railing? : A Little 6 Click Score: 21    End of Session Equipment Utilized During Treatment: Gait belt Activity Tolerance: Patient tolerated treatment well Patient left: in chair;with call bell/phone within reach;with chair alarm set Nurse Communication: Mobility status PT Visit Diagnosis: Other abnormalities of gait and mobility (R26.89);Muscle weakness (generalized) (M62.81);Difficulty in walking, not elsewhere classified (R26.2)     Time: 0347-4259 PT Time Calculation (min) (ACUTE ONLY): 12 min  Charges:  $Therapeutic Exercise: 8-22 mins                     Lieutenant Diego PT, DPT 2:00 PM,11/28/20

## 2020-11-29 ENCOUNTER — Telehealth: Payer: Self-pay

## 2020-11-29 DIAGNOSIS — J449 Chronic obstructive pulmonary disease, unspecified: Secondary | ICD-10-CM

## 2020-11-29 DIAGNOSIS — E785 Hyperlipidemia, unspecified: Secondary | ICD-10-CM

## 2020-11-29 LAB — COMPREHENSIVE METABOLIC PANEL
ALT: 19 U/L (ref 0–44)
AST: 37 U/L (ref 15–41)
Albumin: 2.9 g/dL — ABNORMAL LOW (ref 3.5–5.0)
Alkaline Phosphatase: 49 U/L (ref 38–126)
Anion gap: 9 (ref 5–15)
BUN: 28 mg/dL — ABNORMAL HIGH (ref 8–23)
CO2: 24 mmol/L (ref 22–32)
Calcium: 8.6 mg/dL — ABNORMAL LOW (ref 8.9–10.3)
Chloride: 103 mmol/L (ref 98–111)
Creatinine, Ser: 1.25 mg/dL — ABNORMAL HIGH (ref 0.61–1.24)
GFR, Estimated: 56 mL/min — ABNORMAL LOW (ref >60)
Glucose, Bld: 107 mg/dL — ABNORMAL HIGH (ref 70–99)
Potassium: 3.7 mmol/L (ref 3.5–5.1)
Sodium: 136 mmol/L (ref 135–145)
Total Bilirubin: 1 mg/dL (ref 0.3–1.2)
Total Protein: 6.3 g/dL — ABNORMAL LOW (ref 6.5–8.1)

## 2020-11-29 LAB — CBC
HCT: 29.6 % — ABNORMAL LOW (ref 39.0–52.0)
Hemoglobin: 9.6 g/dL — ABNORMAL LOW (ref 13.0–17.0)
MCH: 28.9 pg (ref 26.0–34.0)
MCHC: 32.4 g/dL (ref 30.0–36.0)
MCV: 89.2 fL (ref 80.0–100.0)
Platelets: 222 10*3/uL (ref 150–400)
RBC: 3.32 MIL/uL — ABNORMAL LOW (ref 4.22–5.81)
RDW: 14.2 % (ref 11.5–15.5)
WBC: 12.6 10*3/uL — ABNORMAL HIGH (ref 4.0–10.5)
nRBC: 0 % (ref 0.0–0.2)

## 2020-11-29 MED ORDER — METRONIDAZOLE 500 MG PO TABS: 500.0000 mg | ORAL_TABLET | Freq: Three times a day (TID) | ORAL | 0 refills | Status: DC

## 2020-11-29 MED ORDER — LISINOPRIL 2.5 MG PO TABS: 2.5000 mg | ORAL_TABLET | Freq: Every day | ORAL | 0 refills | Status: DC

## 2020-11-29 MED ORDER — CIPROFLOXACIN HCL 500 MG PO TABS: 500.0000 mg | ORAL_TABLET | Freq: Two times a day (BID) | ORAL | 0 refills | Status: DC

## 2020-11-29 NOTE — TOC Transition Note (Signed)
Transition of Care [facility]) - CM/SW Discharge Note   Patient Details  Name: Leon Estes MRN: [id number] Date of Birth: 10-11-1930  [facility] CM/SW Contact:  Beverly Sessions, RN Phone Number: 11/29/2020, 9:28 AM   Clinical Narrative:     Patient to discharge home today Patient has declined home health services for discharge.  MD notified  No other needs identified for discharge  Final next level of care: Home/Self Care Barriers to Discharge: No Barriers Identified   Patient Goals and CMS Choice Patient states their goals for this hospitalization and ongoing recovery are:: to go home   Choice offered to / list presented to : [facility]  Discharge Placement                       Discharge Plan and Services In-house Referral: NA   Post Acute Care Choice: NA                               Social Determinants of Health (SDOH) Interventions     Readmission Risk Interventions No flowsheet data found.

## 2020-11-29 NOTE — Telephone Encounter (Signed)
Transition Care Management Unsuccessful Follow-up Telephone Call  Date of discharge and from where:  11/29/2020, [facility]  Attempts:  1st Attempt  Reason for unsuccessful TCM follow-up call:  Unable to leave message, recording says "customer is not accepting calls at this time"

## 2020-11-29 NOTE — Discharge Summary (Signed)
Leon Estes SWH:[id number] DOB: 09/08/1930 DOA: 11/26/2020  PCP: Venia Carbon, MD  Admit date: 11/26/2020 Discharge date: 11/29/2020  Admitted From: Home Disposition: Home  Recommendations for Outpatient Follow-up:  1. Follow up with PCP in 1 week 2. Please obtain BMP/CBC in one week 3. Follow-up with cardiology in 1 week for monitor placement for evaluation of possible A. fib  Home Health: Yes-refused   Discharge Condition:Stable CODE STATUS: Full Diet recommendation: Low-fat diet   Brief/Interim Summary: Per HPI  Leon Estes is a 87 y.o. male with medical history significant for coronary artery disease, COPD, essential hypertension, polyneuropathy, hyperlipidemia, BPH who presented to [facility] ED with complaints of sudden onset right upper quadrant abdominal pain that started  several hours after eating dinner last night.  Associated with nausea but no vomiting.ED Course:Afebrile. BP 170/72, heart rate 90, respiratory rate 26, O2 saturation 99% on2 L. Lab studies remarkable for lipase normal 28. Liver chemistries normal. WBC 16,000, hemoglobin 11.4K, platelet count 266K    Presumed acute cholecystitis Presented with sudden onset right upper quadrant abdominal pain associated with nausea. Had similar episode months ago. Right upper quadrant ultrasound and CT scanabdomen and pelvis with contrastwereequivocal. HIDA completed-results below General surgery following Patient wanted to avoid surgery She was started on IV antibiotics and slowly increase diet from clears and advance and he has tolerated. Leukocytosis improved and now remains afebrile Surgery was okay with patient being discharged home on a low-fat diet and continue antibiotic p.o. course for total of 10 day course to be completed Per surgery follow-up as as needed      Acute metabolic encephalopathy, unclear etiology, likely from above Improved      Acute hypoxic respiratory failure,  unclear etiology. Unclear etiology in the setting of hypersomnolence chest x-ray from yesterday with mild increased bronchitic markings IS provided On discharge on room air he was 95%  Coronary artery disease No chest pain  Continue aspirin, statin, beta-blockers      Essential hypertension Stable continue home dose meds  Hypomagnesium  Replaced and stable  Polyneuropathy Continue gabapentin  Hyperlipidemia Continue statin  GERD Continue PPI  BPH Continue flomax  COPD -no acute exacerbation Not on oxygen supplementation at baseline. Continue home meds    Discharge Diagnoses:  Active Problems:   Acute cholecystitis    Discharge Instructions  Discharge Instructions    Call MD for:  persistant nausea and vomiting   Complete by: As directed    Call MD for:  temperature >100.4   Complete by: As directed    Diet - low sodium heart healthy   Complete by: As directed    Low fat diet   Discharge instructions   Complete by: As directed    Follow up with pcp in 1-3 days   Increase activity slowly   Complete by: As directed      Allergies as of 11/29/2020      Reactions   Sulfa Antibiotics Rash   Neosporin [neomycin-bacitracin Zn-polymyx]    Augmentin [amoxicillin-pot Clavulanate] Rash   Terbinafine And Related Rash      Medication List    STOP taking these medications   lisinopril-hydrochlorothiazide 20-25 MG tablet Commonly known as: ZESTORETIC     TAKE these medications   albuterol 108 (90 Base) MCG/ACT inhaler Commonly known as: VENTOLIN HFA Inhale 2 puffs into the lungs every 6 (six) hours as needed for wheezing or shortness of breath.   aspirin EC 81 MG tablet Take 81 mg by mouth  daily.   betamethasone dipropionate 0.05 % cream Apply topically 2 (two) times daily.   budesonide-formoterol 160-4.5 MCG/ACT inhaler Commonly known as: SYMBICORT INHALE 2 PUFFS INTO THE LUNGS TWICE DAILY   ciprofloxacin 500 MG tablet Commonly  known as: Cipro Take 1 tablet (500 mg total) by mouth 2 (two) times daily for 7 days.   gabapentin 300 MG capsule Commonly known as: NEURONTIN Take 300 mg by mouth at bedtime.   gentamicin cream 0.1 % Commonly known as: GARAMYCIN APPLY 1 APPLICATION TOPICALLY TWICE DAILY   lisinopril 2.5 MG tablet Commonly known as: ZESTRIL Take 1 tablet (2.5 mg total) by mouth daily. Start taking on: November 30, 2020   metoprolol succinate 25 MG 24 hr tablet Commonly known as: TOPROL-XL Take 1 tablet (25 mg total) by mouth daily.   metroNIDAZOLE 500 MG tablet Commonly known as: Flagyl Take 1 tablet (500 mg total) by mouth 3 (three) times daily for 7 days.   NONFORMULARY OR COMPOUNDED ITEM See pharmacy note   pantoprazole 40 MG tablet Commonly known as: PROTONIX TAKE 1 TABLET BY MOUTH ONCE A DAY   simvastatin 40 MG tablet Commonly known as: ZOCOR Take 40 mg by mouth every evening.   tamsulosin 0.4 MG Caps capsule Commonly known as: FLOMAX TAKE 1 CAPSULE BY MOUTH ONCE DAILY   traMADol 50 MG tablet Commonly known as: ULTRAM 50 mg 2 (two) times daily as needed.       Follow-up Information    Nahser, Wonda Cheng, MD Follow up in 1 week(s).   Specialty: Cardiology Why: needs to be seen early for possible afib. needs monitor , etc. Contact information: [place] [address] [phone]        Venia Carbon, MD Follow up in 1 week(s).   Specialties: Internal Medicine, Pediatrics Contact information: [place] [address] [phone]              Allergies  Allergen Reactions  . Sulfa Antibiotics Rash  . Neosporin [Neomycin-Bacitracin Zn-Polymyx]   . Augmentin [Amoxicillin-Pot Clavulanate] Rash  . Terbinafine And Related Rash    Consultations:  General surgery   Procedures/Studies: NM Hepatobiliary Liver Func  Result Date: 11/28/2020 CLINICAL DATA:  Upper abdominal pain.  Cholelithiasis. EXAM: NUCLEAR  MEDICINE HEPATOBILIARY IMAGING TECHNIQUE: Sequential images of the abdomen were obtained out to 60 minutes following intravenous administration of radiopharmaceutical. Patient refused continued imaging with morphine administration due to shortness of breath. RADIOPHARMACEUTICALS:  5.5 mCi Tc-3m  Choletec IV COMPARISON:  None. FINDINGS: Prompt uptake and biliary excretion of activity by the liver is seen. Biliary activity passes into small bowel, consistent with patent common bile duct. However, no gallbladder activity is seen. IMPRESSION: Absence of gallbladder activity, consistent with cystic duct obstruction. Note that patient refused additional imaging with morphine administration. No evidence of biliary obstruction. Electronically Signed   By: Marlaine Hind M.D.   On: 11/28/2020 11:46   CT ABDOMEN PELVIS W CONTRAST  Result Date: 11/26/2020 CLINICAL DATA:  Epigastric pain. EXAM: CT ABDOMEN AND PELVIS WITH CONTRAST TECHNIQUE: Multidetector CT imaging of the abdomen and pelvis was performed using the standard protocol following bolus administration of intravenous contrast. CONTRAST:  164mL OMNIPAQUE IOHEXOL 300 MG/ML  SOLN COMPARISON:  CT abdomen pelvis 09/13/2020, CT abdomen pelvis 05/04/2013 FINDINGS: Lower chest: Bilateral lower lobe subsegmental atelectasis. 8 mm nodular-like triangular subpleural density within the lingula. Hepatobiliary: No focal liver abnormality. No gallstones. Question borderline thickened gallbladder wall thickening and  trace pericholecystic fluid. No biliary dilatation. Pancreas: No focal lesion. Normal pancreatic contour. No surrounding inflammatory changes. No main pancreatic ductal dilatation. Spleen: Normal in size without focal abnormality. Adrenals/Urinary Tract: No adrenal nodule bilaterally. Bilateral kidneys enhance symmetrically. 2.7 cm fluid density lesion within left kidney likely represents a simple renal cyst. No hydronephrosis. No hydroureter. The urinary bladder is  unremarkable. Stomach/Bowel: Stomach is within normal limits. Second portion of the duodenum diverticula again noted. No evidence of bowel wall thickening or dilatation. Appendix appears normal. Vascular/Lymphatic: The portal, splenic, superior mesenteric veins are patent. No abdominal aorta or iliac aneurysm. Severe atherosclerotic plaque of the aorta and its branches. No abdominal, pelvic, or inguinal lymphadenopathy. Reproductive: The prostate is enlarged measuring up to 6.4 cm. Other: No large volume intraperitoneal free fluid. No intraperitoneal free gas. No organized fluid collection. Musculoskeletal: No abdominal wall hernia or abnormality. No suspicious lytic or blastic osseous lesions. No acute displaced fracture. Multilevel degenerative changes of the spine with intervertebral disc space vacuum phenomenon endplate sclerosis at the L4-L5 and L5-S1 levels. Grade 1-2 anterolisthesis of L5 on S1. IMPRESSION: 1. Question borderline thickened gallbladder wall thickening and trace pericholecystic fluid. Recommend right upper quadrant ultrasound for a more sensitive evaluation of the gallbladder. 2. Prostatomegaly. 3. Severe degenerative changes of the L4 through S1 levels with grade 1-2 anterolisthesis of L5 on S1. 4.  Aortic Atherosclerosis (ICD10-I70.0). 5. An 8 mm nodular-like subpleural triangular density within the lingula that likely represents an intrapulmonary lymph node. Non-contrast chest CT at 6-12 months is recommended. If the nodule is stable at time of repeat CT, then future CT at 18-24 months (from today's scan) is considered optional for low-risk patients, but is recommended for high-risk patients. This recommendation follows the consensus statement: Guidelines for Management of Incidental Pulmonary Nodules Detected on CT Images: From the Fleischner Society 2017; Radiology 2017; 284:228-243. Electronically Signed   By: Iven Finn M.D.   On: 11/26/2020 17:03   DG Chest Port 1 View  Result  Date: 11/27/2020 CLINICAL DATA:  Hypoxia EXAM: PORTABLE CHEST 1 VIEW COMPARISON:  09/12/2020 FINDINGS: Cardiac shadow is within normal limits. Aortic calcifications are again seen. The lungs are well aerated bilaterally. Mild increased bronchitic markings are noted without focal infiltrate. No bony abnormality is seen. IMPRESSION: Mild increased bronchitic markings. Electronically Signed   By: Inez Catalina M.D.   On: 11/27/2020 17:19   US Abdomen Limited RUQ (LIVER/GB)  Result Date: 11/26/2020 CLINICAL DATA:  Epigastric pain for 1 day. EXAM: ULTRASOUND ABDOMEN LIMITED RIGHT UPPER QUADRANT COMPARISON:  None. FINDINGS: Gallbladder: The gallbladder is contracted which limits evaluation. No gallbladder wall thickening. There is a calculus measuring 0.9 cm. Negative sonographic Murphy sign. Common bile duct: Diameter: 0.6 cm, within normal limits. Liver: No focal lesion identified. Within normal limits in parenchymal echogenicity. Portal vein is patent on color Doppler imaging with normal direction of blood flow towards the liver. Other: None. IMPRESSION: The gallbladder is contracted which limits evaluation. Cholelithiasis without other evidence of acute cholecystitis. Electronically Signed   By: Audie Pinto M.D.   On: 11/26/2020 18:17       Subjective: Feels well tolerated food would like to go home.  Discharge Exam: Vitals:   11/29/20 0455 11/29/20 0810  BP: 126/75 (!) 150/65  Pulse: 83 84  Resp: 17 16  Temp: 98.2 F (36.8 C) (!) 97.5 F (36.4 C)  SpO2: 95% 99%   Vitals:   11/28/20 1942 11/29/20 0002 11/29/20 0455 11/29/20 0810  BP: 113/60  124/65 126/75 (!) 150/65  Pulse: 70 87 83 84  Resp: 16 18 17 16   Temp: 97.7 F (36.5 C) 98.1 F (36.7 C) 98.2 F (36.8 C) (!) 97.5 F (36.4 C)  TempSrc: Oral Oral Oral Oral  SpO2: 98% 97% 95% 99%  Weight:      Height:        General: Pt is alert, awake, not in acute distress Cardiovascular: RRR, S1/S2 +, no rubs, no  gallops Respiratory: CTA bilaterally, no wheezing, no rhonchi Abdominal: Soft, NT, ND, bowel sounds + Extremities: no edema    The results of significant diagnostics from this hospitalization (including imaging, microbiology, ancillary and laboratory) are listed below for reference.     Microbiology: Recent Results (from the past 240 hour(s))  Resp Panel by RT-PCR (Flu A&B, Covid) Nasopharyngeal Swab     Status: None   Collection Time: 11/26/20  7:43 PM   Specimen: Nasopharyngeal Swab; Nasopharyngeal(NP) swabs in vial transport medium  Result Value Ref Range Status   SARS Coronavirus 2 by RT PCR NEGATIVE NEGATIVE Final    Comment: (NOTE) SARS-CoV-2 target nucleic acids are NOT DETECTED.  The SARS-CoV-2 RNA is generally detectable in upper respiratory specimens during the acute phase of infection. The lowest concentration of SARS-CoV-2 viral copies this assay can detect is 138 copies/mL. A negative result does not preclude SARS-Cov-2 infection and should not be used as the sole basis for treatment or other patient management decisions. A negative result may occur with  improper specimen collection/handling, submission of specimen other than nasopharyngeal swab, presence of viral mutation(s) within the areas targeted by this assay, and inadequate number of viral copies(<138 copies/mL). A negative result must be combined with clinical observations, patient history, and epidemiological information. The expected result is Negative.  Fact Sheet for Patients:  EntrepreneurPulse.com.au  Fact Sheet for Healthcare Providers:  IncredibleEmployment.be  This test is no t yet approved or cleared by the [place] FDA and  has been authorized for detection and/or diagnosis of SARS-CoV-2 by FDA under an Emergency Use Authorization (EUA). This EUA will remain  in effect (meaning this test can be used) for the duration of the COVID-19 declaration under  Section 564(b)(1) of the Act, 21 U.S.C.section 360bbb-3(b)(1), unless the authorization is terminated  or revoked sooner.       Influenza A by PCR NEGATIVE NEGATIVE Final   Influenza B by PCR NEGATIVE NEGATIVE Final    Comment: (NOTE) The Xpert Xpress SARS-CoV-2/FLU/RSV plus assay is intended as an aid in the diagnosis of influenza from Nasopharyngeal swab specimens and should not be used as a sole basis for treatment. Nasal washings and aspirates are unacceptable for Xpert Xpress SARS-CoV-2/FLU/RSV testing.  Fact Sheet for Patients: EntrepreneurPulse.com.au  Fact Sheet for Healthcare Providers: IncredibleEmployment.be  This test is not yet approved or cleared by the [place] FDA and has been authorized for detection and/or diagnosis of SARS-CoV-2 by FDA under an Emergency Use Authorization (EUA). This EUA will remain in effect (meaning this test can be used) for the duration of the COVID-19 declaration under Section 564(b)(1) of the Act, 21 U.S.C. section 360bbb-3(b)(1), unless the authorization is terminated or revoked.  Performed at [facility], [address], [place], [place] [address]   Urine Culture     Status: None   Collection Time: 11/26/20  8:25 PM   Specimen: Urine, Random  Result Value Ref Range Status   Specimen Description   Final    URINE, RANDOM Performed at [facility], [address], [place], [place] [address]    Special Requests   Final    NONE Performed at [facility], [address] [place], [place] [address]    Culture   Final    NO GROWTH Performed at [facility], [place] [address] [place], [place] [address]    Report Status 11/28/2020 FINAL  Final  CULTURE, BLOOD (ROUTINE X 2) w Reflex to ID Panel     Status: None (Preliminary result)   Collection Time: 11/26/20  8:48 PM   Specimen: BLOOD  Result Value Ref Range Status   Specimen Description BLOOD LEFT [facility]   Final   Special Requests   Final    BOTTLES DRAWN AEROBIC AND ANAEROBIC Blood Culture adequate volume   Culture   Final    NO GROWTH 3 DAYS Performed at [facility], [address], [place], [place] [address]    Report Status PENDING  Incomplete  CULTURE, BLOOD (ROUTINE X 2) w Reflex to ID Panel     Status: None (Preliminary result)   Collection Time: 11/26/20  8:49 PM   Specimen: BLOOD  Result Value Ref Range Status   Specimen Description BLOOD LEFT HAND  Final   Special Requests   Final    BOTTLES DRAWN AEROBIC AND ANAEROBIC Blood Culture results may not be optimal due to an inadequate volume of blood received in culture bottles   Culture   Final    NO GROWTH 3 DAYS Performed at [facility], [address] [place], [place] [address]    Report Status PENDING  Incomplete     Labs: BNP (last 3 results) No results for input(s): BNP in the last 8760 hours. Basic Metabolic Panel: Recent Labs  Lab 11/26/20 1513 11/27/20 0445 11/28/20 0536 11/29/20 0509  NA 135 137  --  136  K 4.4 4.2  --  3.7  CL 100 99  --  103  CO2 28 28  --  24  GLUCOSE 122* 122*  --  107*  BUN 20 19  --  28*  CREATININE 1.17 1.15  --  1.25*  CALCIUM 8.9 8.9  --  8.6*  MG  --  1.6* 2.0  --   PHOS  --  2.6  --   --    Liver Function Tests: Recent Labs  Lab 11/26/20 1513 11/27/20 0445 11/29/20 0509  AST 26 23 37  ALT 21 19 19   ALKPHOS 65 58 49  BILITOT 0.8 1.1 1.0  PROT 7.5 7.0 6.3*  ALBUMIN 4.0 3.6 2.9*   Recent Labs  Lab 11/26/20 1513 11/27/20 0445  LIPASE 28 24   No results for input(s): AMMONIA in the last 168 hours. CBC: Recent Labs  Lab 11/26/20 1513 11/27/20 0445 11/28/20 0536 11/29/20 0509  WBC 16.0* 17.1* 18.2* 12.6*  NEUTROABS  --  13.3*  --   --   HGB 11.4* 11.6* 11.6* 9.6*  HCT 36.4* 35.4* 36.0* 29.6*  MCV 90.5 88.5 90.0 89.2  PLT 266 267 269 222   Cardiac Enzymes: No results for input(s): CKTOTAL, CKMB, CKMBINDEX, TROPONINI in the last 168  hours. BNP: Invalid input(s): POCBNP CBG: No results for input(s): GLUCAP in the last 168 hours. D-Dimer No results for input(s): DDIMER in the last 72 hours. Hgb A1c No results for input(s): HGBA1C in the last 72 hours. Lipid Profile No results for input(s): CHOL, HDL, LDLCALC, TRIG, CHOLHDL, LDLDIRECT in the last 72 hours. Thyroid function studies No  results for input(s): TSH, T4TOTAL, T3FREE, THYROIDAB in the last 72 hours.  Invalid input(s): FREET3 Anemia work up No results for input(s): VITAMINB12, FOLATE, FERRITIN, TIBC, IRON, RETICCTPCT in the last 72 hours. Urinalysis    Component Value Date/Time   COLORURINE STRAW (A) 11/26/2020 2025   APPEARANCEUR CLEAR (A) 11/26/2020 2025   APPEARANCEUR Clear 04/29/2013 1543   LABSPEC 1.039 (H) 11/26/2020 2025   LABSPEC 1.020 04/29/2013 1543   PHURINE 6.0 11/26/2020 2025   GLUCOSEU 50 (A) 11/26/2020 2025   GLUCOSEU Negative 04/29/2013 1543   HGBUR MODERATE (A) 11/26/2020 2025   BILIRUBINUR NEGATIVE 11/26/2020 2025   BILIRUBINUR + 07/25/2018 1304   BILIRUBINUR Negative 04/29/2013 1543   KETONESUR 5 (A) 11/26/2020 2025   PROTEINUR 100 (A) 11/26/2020 2025   UROBILINOGEN 0.2 07/25/2018 1304   UROBILINOGEN 0.2 03/08/2012 1624   NITRITE NEGATIVE 11/26/2020 2025   LEUKOCYTESUR NEGATIVE 11/26/2020 2025   LEUKOCYTESUR Negative 04/29/2013 1543   Sepsis Labs Invalid input(s): PROCALCITONIN,  WBC,  LACTICIDVEN Microbiology Recent Results (from the past 240 hour(s))  Resp Panel by RT-PCR (Flu A&B, Covid) Nasopharyngeal Swab     Status: None   Collection Time: 11/26/20  7:43 PM   Specimen: Nasopharyngeal Swab; Nasopharyngeal(NP) swabs in vial transport medium  Result Value Ref Range Status   SARS Coronavirus 2 by RT PCR NEGATIVE NEGATIVE Final    Comment: (NOTE) SARS-CoV-2 target nucleic acids are NOT DETECTED.  The SARS-CoV-2 RNA is generally detectable in upper respiratory specimens during the acute phase of infection. The  lowest concentration of SARS-CoV-2 viral copies this assay can detect is 138 copies/mL. A negative result does not preclude SARS-Cov-2 infection and should not be used as the sole basis for treatment or other patient management decisions. A negative result may occur with  improper specimen collection/handling, submission of specimen other than nasopharyngeal swab, presence of viral mutation(s) within the areas targeted by this assay, and inadequate number of viral copies(<138 copies/mL). A negative result must be combined with clinical observations, patient history, and epidemiological information. The expected result is Negative.  Fact Sheet for Patients:  EntrepreneurPulse.com.au  Fact Sheet for Healthcare Providers:  IncredibleEmployment.be  This test is no t yet approved or cleared by the [place] FDA and  has been authorized for detection and/or diagnosis of SARS-CoV-2 by FDA under an Emergency Use Authorization (EUA). This EUA will remain  in effect (meaning this test can be used) for the duration of the COVID-19 declaration under Section 564(b)(1) of the Act, 21 U.S.C.section 360bbb-3(b)(1), unless the authorization is terminated  or revoked sooner.       Influenza A by PCR NEGATIVE NEGATIVE Final   Influenza B by PCR NEGATIVE NEGATIVE Final    Comment: (NOTE) The Xpert Xpress SARS-CoV-2/FLU/RSV plus assay is intended as an aid in the diagnosis of influenza from Nasopharyngeal swab specimens and should not be used as a sole basis for treatment. Nasal washings and aspirates are unacceptable for Xpert Xpress SARS-CoV-2/FLU/RSV testing.  Fact Sheet for Patients: EntrepreneurPulse.com.au  Fact Sheet for Healthcare Providers: IncredibleEmployment.be  This test is not yet approved or cleared by the [place] FDA and has been authorized for detection and/or diagnosis of SARS-CoV-2 by FDA under  an Emergency Use Authorization (EUA). This EUA will remain in effect (meaning this test can be used) for the duration of the COVID-19 declaration under Section 564(b)(1) of the Act, 21 U.S.C. section 360bbb-3(b)(1), unless the authorization is terminated or revoked.  Performed at [facility], [phone]  [address] [place], [place] [address]   Urine Culture     Status: None   Collection Time: 11/26/20  8:25 PM   Specimen: Urine, Random  Result Value Ref Range Status   Specimen Description   Final    URINE, RANDOM Performed at [facility], [address] [place], [place] [address]    Special Requests   Final    NONE Performed at [facility], [address] [place], [place] [address]    Culture   Final    NO GROWTH Performed at [facility], [place] [address], [place], [place] [address]    Report Status 11/28/2020 FINAL  Final  CULTURE, BLOOD (ROUTINE X 2) w Reflex to ID Panel     Status: None (Preliminary result)   Collection Time: 11/26/20  8:48 PM   Specimen: BLOOD  Result Value Ref Range Status   Specimen Description BLOOD LEFT [facility]  Final   Special Requests   Final    BOTTLES DRAWN AEROBIC AND ANAEROBIC Blood Culture adequate volume   Culture   Final    NO GROWTH 3 DAYS Performed at [facility], [address] [place], [place] [address]    Report Status PENDING  Incomplete  CULTURE, BLOOD (ROUTINE X 2) w Reflex to ID Panel     Status: None (Preliminary result)   Collection Time: 11/26/20  8:49 PM   Specimen: BLOOD  Result Value Ref Range Status   Specimen Description BLOOD LEFT HAND  Final   Special Requests   Final    BOTTLES DRAWN AEROBIC AND ANAEROBIC Blood Culture results may not be optimal due to an inadequate volume of blood received in culture bottles   Culture   Final    NO GROWTH 3 DAYS Performed at [facility], [address], [place], [place] [address]    Report Status PENDING  Incomplete     Time  coordinating discharge: Over 30 minutes  SIGNED:   Nolberto Hanlon, MD  [facility] 11/29/2020, 9:57 AM Pager   If 7PM-7AM, please contact night-coverage [email or web address] Password [email or web address]

## 2020-11-29 NOTE — Progress Notes (Signed)
[facility] SURGICAL PROGRESS NOTE (cpt [phone])  Hospital Day(s): 3.   Interval History: Patient seen and examined, no acute events or new complaints overnight. Patient reports he continues to feel better this morning. He reports his previous abdominal pain has resolved. He denies fever, chills, nausea, emesis. His leukocytosis is improving this morning; down to 12.6K. His Hgb is trending down, now 9.6, suspect significant dilutional component to this. His sCr did bump to 1.25 today, but his sCr seems variable from previous recordings. No electrolyte derangements. He did undergo limited HIDA scan yesterday in which the gallbladder was not visualized. He continues to decline any interventions and his contracted gallbladder is likely not amenable to percutaneous drainage. He was started on CLD and advanced to low fat diet. When asked if he had any solid foods he says "no" but when asked what he had for dinner last night he reports it was "chicken, gravy, and potatoes." He has remained on Cefepime and Flagyl.   Review of Systems:  Constitutional: denies fever, chills  HEENT: denies cough or congestion  Respiratory: denies any shortness of breath  Cardiovascular: denies chest pain or palpitations  Gastrointestinal: denies abdominal pain, N/V, or diarrhea/and bowel function as per interval history Genitourinary: denies burning with urination or urinary frequency   Vital signs in last 24 hours: [min-max] current  Temp:  [97.7 F (36.5 C)-98.2 F (36.8 C)] 98.2 F (36.8 C) (04/19 0455) Pulse Rate:  [65-87] 83 (04/19 0455) Resp:  [16-18] 17 (04/19 0455) BP: (113-137)/(53-75) 126/75 (04/19 0455) SpO2:  [94 %-98 %] 95 % (04/19 0455)     Height: 5\' 7"  (170.2 cm) Weight: 72.6 kg BMI (Calculated): 25.05   Intake/Output last 2 shifts:  04/18 0701 - 04/19 0700 In: 870 [P.O.:270; IV Piggyback:600] Out: 500 [Urine:500]   Physical Exam:  Constitutional: alert, cooperative and no  distress  HENT: normocephalic without obvious abnormality  Eyes: PERRL, EOM's grossly intact and symmetric  Respiratory: breathing non-labored at rest  Cardiovascular: regular rate and sinus rhythm  Gastrointestinal: soft, non-tender this morning, and non-distended, no rebound/guarding, negative Murphy's Sign  Musculoskeletal: no edema or wounds, motor and sensation grossly intact, NT    Labs:  CBC Latest Ref Rng & Units 11/29/2020 11/28/2020 11/27/2020  WBC 4.0 - 10.5 K/uL 12.6(H) 18.2(H) 17.1(H)  Hemoglobin 13.0 - 17.0 g/dL 9.6(L) 11.6(L) 11.6(L)  Hematocrit 39.0 - 52.0 % 29.6(L) 36.0(L) 35.4(L)  Platelets 150 - 400 K/uL 222 269 267   CMP Latest Ref Rng & Units 11/29/2020 11/27/2020 11/26/2020  Glucose 70 - 99 mg/dL 107(H) 122(H) 122(H)  BUN 8 - 23 mg/dL 28(H) 19 20  Creatinine 0.61 - 1.24 mg/dL 1.25(H) 1.15 1.17  Sodium 135 - 145 mmol/L 136 137 135  Potassium 3.5 - 5.1 mmol/L 3.7 4.2 4.4  Chloride 98 - 111 mmol/L 103 99 100  CO2 22 - 32 mmol/L 24 28 28   Calcium 8.9 - 10.3 mg/dL 8.6(L) 8.9 8.9  Total Protein 6.5 - 8.1 g/dL 6.3(L) 7.0 7.5  Total Bilirubin 0.3 - 1.2 mg/dL 1.0 1.1 0.8  Alkaline Phos 38 - 126 U/L 49 58 65  AST 15 - 41 U/L 37 23 26  ALT 0 - 44 U/L 19 19 21      Imaging studies: No new pertinent imaging studies   Assessment/Plan: (ICD-10's: K81.0) 87 y.o. male with improving leukocytosis and resolution in previous RUQ pain, found to have 9 mm stone in very contracted gallbladder and equivocal work up for cholecystitis, complicated by numerous  pertinent comorbidities.   - Despite positive limited HIDA evaluation, patient continues to remain adamant against any surgical interventions, and I do not think his contracted gallbladder would be amenable to percutnaeous cholecystostomy tube placement. Thankfully, he seems to have made good clinical progress this morning with continued IV ABx and I believe is tolerating a regular, fat restricted, diet. I think it is reasonable to  continue conservative measures and transition towards home today from surgical standpoint.    - Continue IV ABx (Cefepime + Flagyl); Transition to PO for home; recommend 7-10 days total             - Monitor abdominal examination             - Pain control prn; antiemetics prn              - Monitor leukocytosis; improved             - Further management per primary service    - Discharge Planning: Nothing further to add from surgical perspective, ABx for home as above. He should continue to follow fat-restricted diet for home, and I will provide instructions for this. He can follow up with PCP, we will be available as needed.  All of the above findings and recommendations were discussed with the patient, and the medical team, and all of patient's questions were answered to his expressed satisfaction.  -- Edison Simon, PA-C [facility] 11/29/2020, 7:07 AM [phone] M-F: 7am - 4pm

## 2020-11-29 NOTE — Care Management Important Message (Signed)
Important Message  Patient Details  Name: Leon Estes MRN: [id number] Date of Birth: 15-Jan-1931   Medicare Important Message Given:  N/A - LOS <3 / Initial given by admissions     Juliann Pulse A Aviv Lengacher 11/29/2020, 10:17 AM

## 2020-11-29 NOTE — Plan of Care (Signed)

## 2020-11-29 NOTE — Plan of Care (Signed)
  Problem: Clinical Measurements: Goal: Ability to maintain clinical measurements within normal limits will improve Outcome: Progressing Goal: Will remain free from infection Outcome: Progressing Goal: Diagnostic test results will improve Outcome: Progressing Goal: Respiratory complications will improve Outcome: Progressing Goal: Cardiovascular complication will be avoided Outcome: Progressing   Problem: Activity: Goal: Risk for activity intolerance will decrease Outcome: Progressing   Problem: Pain Managment: Goal: General experience of comfort will improve Outcome: Progressing   Pt is alert but confused this morning. V/S stable. No complaints of pain. Voiding well.

## 2020-11-30 ENCOUNTER — Telehealth: Payer: Self-pay

## 2020-11-30 NOTE — Telephone Encounter (Signed)
Transition Care Management Unsuccessful Follow-up Telephone Call  Date of discharge and from where:  11/29/2020, [facility]  Attempts:  3rd Attempt  Reason for unsuccessful TCM follow-up call:  Unable to reach patient, Recording says "the party you are calling is not accepting calls at this time.    Please see other TCM note in chart for the previous 2 phone calls.

## 2020-11-30 NOTE — Telephone Encounter (Signed)
Transition Care Management Unsuccessful Follow-up Telephone Call  Date of discharge and from where:  11/29/2020, [facility]  Attempts:  2nd Attempt  Reason for unsuccessful TCM follow-up call:  Unable to leave message, Recording says "the party you are calling is not accepting calls at this time.

## 2020-12-01 LAB — CULTURE, BLOOD (ROUTINE X 2)
Culture: NO GROWTH
Culture: NO GROWTH
Special Requests: ADEQUATE

## 2020-12-01 NOTE — Telephone Encounter (Signed)
He does have a cardiology appt tomorrow and one with me next week---so at least follow up is scheduled

## 2020-12-02 ENCOUNTER — Ambulatory Visit: Payer: PPO | Admitting: Physician Assistant

## 2020-12-06 ENCOUNTER — Encounter: Payer: Self-pay | Admitting: Internal Medicine

## 2020-12-06 ENCOUNTER — Ambulatory Visit (INDEPENDENT_AMBULATORY_CARE_PROVIDER_SITE_OTHER): Payer: PPO | Admitting: Internal Medicine

## 2020-12-06 ENCOUNTER — Other Ambulatory Visit: Payer: Self-pay

## 2020-12-06 VITALS — BP 130/72 | HR 71 | Temp 98.2°F | Ht 67.0 in | Wt 159.0 lb

## 2020-12-06 DIAGNOSIS — R911 Solitary pulmonary nodule: Secondary | ICD-10-CM

## 2020-12-06 DIAGNOSIS — K810 Acute cholecystitis: Secondary | ICD-10-CM

## 2020-12-06 DIAGNOSIS — J449 Chronic obstructive pulmonary disease, unspecified: Secondary | ICD-10-CM

## 2020-12-06 DIAGNOSIS — N1831 Chronic kidney disease, stage 3a: Secondary | ICD-10-CM

## 2020-12-06 DIAGNOSIS — I499 Cardiac arrhythmia, unspecified: Secondary | ICD-10-CM | POA: Diagnosis not present

## 2020-12-06 LAB — CBC
HCT: 36.1 % — ABNORMAL LOW (ref 39.0–52.0)
Hemoglobin: 11.7 g/dL — ABNORMAL LOW (ref 13.0–17.0)
MCHC: 32.3 g/dL (ref 30.0–36.0)
MCV: 88.8 fl (ref 78.0–100.0)
Platelets: 361 10*3/uL (ref 150.0–400.0)
RBC: 4.06 Mil/uL — ABNORMAL LOW (ref 4.22–5.81)
RDW: 15 % (ref 11.5–15.5)
WBC: 7.2 10*3/uL (ref 4.0–10.5)

## 2020-12-06 LAB — RENAL FUNCTION PANEL
Albumin: 3.6 g/dL (ref 3.5–5.2)
BUN: 20 mg/dL (ref 6–23)
CO2: 26 mEq/L (ref 19–32)
Calcium: 9.5 mg/dL (ref 8.4–10.5)
Chloride: 105 mEq/L (ref 96–112)
Creatinine, Ser: 1.3 mg/dL (ref 0.40–1.50)
GFR: 49.32 mL/min — ABNORMAL LOW (ref >60.00)
Glucose, Bld: 100 mg/dL — ABNORMAL HIGH (ref 70–99)
Phosphorus: 2.2 mg/dL — ABNORMAL LOW (ref 2.3–4.6)
Potassium: 4.1 mEq/L (ref 3.5–5.1)
Sodium: 138 mEq/L (ref 135–145)

## 2020-12-06 NOTE — Assessment & Plan Note (Signed)
Last GFR better than baseline Will recheck labs

## 2020-12-06 NOTE — Assessment & Plan Note (Signed)
Fairly clear presentation though imaging slightly discordant Finishing cipro/flagyl today Okay to reintroduce some more regular food--but avoid overtly fatty foods (I am concerned about weight loss) If he has sputtering symptoms, would need to consider elective cholecystectomy

## 2020-12-06 NOTE — Assessment & Plan Note (Signed)
Stable respiratory status on the symbicort

## 2020-12-06 NOTE — Assessment & Plan Note (Signed)
Some question about atrial fib----?on monitor Clearly has regular rhythm here---but lots of ectopy Is going to see Dr Acie Fredrickson soon

## 2020-12-06 NOTE — Assessment & Plan Note (Signed)
Small density in lingula Likely benign Will discuss at next wellness visit--whether to repeat CT

## 2020-12-06 NOTE — Progress Notes (Signed)
Subjective:    Patient ID: Leon Estes, male    DOB: 1931-06-05, 87 y.o.   MRN: [id number]  HPI Here with wife for hospital follow up This visit occurred during the SARS-CoV-2 public health emergency.  Safety protocols were in place, including screening questions prior to the visit, additional usage of staff PPE, and extensive cleaning of exam room while observing appropriate contact time as indicated for disinfecting solutions.   He went to ER for severe RUQ abdominal pain--occurred suddenly Lasted all day and worsened (went to ER 2:30PM) CT suggested cholecystitis Ultrasound benign HIDA showed no obstruction but non visualization of cystic duct Decision for no surgery Symptoms improved  ??some atrial fib seen on monitor--no clear documentation about this  Sent home on cipro and metronidazole Ends today No recurrence of pain Bowels moving fine No N/V Appetite still not great---wife cut out all salt and fatty foods  Reviewed all imaging and hospital records Aortic atherosclerosis also seen 83mm subpleural density in lingula--likely lymph node. Incidental finding and recommended non contrast CT in 6-12 months  Last GFR 56  Current Outpatient Medications on File Prior to Visit  Medication Sig Dispense Refill  . aspirin EC 81 MG tablet Take 81 mg by mouth daily.    . betamethasone dipropionate 0.05 % cream Apply topically 2 (two) times daily. 45 g 1  . budesonide-formoterol (SYMBICORT) 160-4.5 MCG/ACT inhaler INHALE 2 PUFFS INTO THE LUNGS TWICE DAILY 10.2 g 5  . ciprofloxacin (CIPRO) 500 MG tablet Take 1 tablet (500 mg total) by mouth 2 (two) times daily for 7 days. 14 tablet 0  . gabapentin (NEURONTIN) 300 MG capsule Take 300 mg by mouth at bedtime.    Marland Kitchen gentamicin cream (GARAMYCIN) 0.1 % APPLY 1 APPLICATION TOPICALLY TWICE DAILY 30 g 1  . lisinopril (ZESTRIL) 2.5 MG tablet Take 1 tablet (2.5 mg total) by mouth daily. 30 tablet 0  . metoprolol succinate (TOPROL-XL) 25 MG 24  hr tablet Take 1 tablet (25 mg total) by mouth daily. 30 tablet 11  . metroNIDAZOLE (FLAGYL) 500 MG tablet Take 1 tablet (500 mg total) by mouth 3 (three) times daily for 7 days. 21 tablet 0  . NONFORMULARY OR COMPOUNDED ITEM See pharmacy note 120 each 2  . pantoprazole (PROTONIX) 40 MG tablet TAKE 1 TABLET BY MOUTH ONCE A DAY 90 tablet 3  . simvastatin (ZOCOR) 40 MG tablet Take 40 mg by mouth every evening.    . tamsulosin (FLOMAX) 0.4 MG CAPS capsule TAKE 1 CAPSULE BY MOUTH ONCE DAILY 90 capsule 3  . traMADol (ULTRAM) 50 MG tablet 50 mg 2 (two) times daily as needed.      No current facility-administered medications on file prior to visit.    Allergies  Allergen Reactions  . Sulfa Antibiotics Rash  . Neosporin [Neomycin-Bacitracin Zn-Polymyx]   . Augmentin [Amoxicillin-Pot Clavulanate] Rash  . Terbinafine And Related Rash    Past Medical History:  Diagnosis Date  . Asthma   . BPH (benign prostatic hypertrophy)   . Cataract   . Chronic renal disease, stage III ([place])   . Chronic sinusitis   . COPD (chronic obstructive pulmonary disease) ([place])   . Coronary artery disease    2 stents  . Gall stones   . Hearing loss    DOES NOT WEAR HEARING AIDS  . High cholesterol   . Hypertension   . Macular degeneration    right eye    Past Surgical History:  Procedure Laterality Date  .  APPENDECTOMY    . CORONARY ANGIOPLASTY WITH STENT PLACEMENT  1998   2 stents  . TRANSURETHRAL RESECTION OF PROSTATE      Family History  Problem Relation Age of Onset  . Diabetes Father   . Heart disease Brother     Social History   Socioeconomic History  . Marital status: Married    Spouse name: Not on file  . Number of children: 4  . Years of education: Not on file  . Highest education level: Not on file  Occupational History  . Occupation: Drove truck and concrete work  Tobacco Use  . Smoking status: Former Smoker    Types: Cigarettes    Quit date: 08/13/2005    Years since quitting:  15.3  . Smokeless tobacco: Current User    Types: Chew  . Tobacco comment: discussed stopping this (only once in a while)  Vaping Use  . Vaping Use: Never used  Substance and Sexual Activity  . Alcohol use: No  . Drug use: No  . Sexual activity: Not on file  Other Topics Concern  . Not on file  Social History Narrative   No living will   Wife, then [place] daughter should make decisions   Would accept resuscitation   Would probably accept tube feeds   Social Determinants of Health   Financial Resource Strain: Not on file  Food Insecurity: Not on file  Transportation Needs: Not on file  Physical Activity: Not on file  Stress: Not on file  Social Connections: Not on file  Intimate Partner Violence: Not on file   Review of Systems No chest pain No palpitations No change is usual DOE---due to COPD. Still on symbicort daily    Objective:   Physical Exam Constitutional:      Appearance: Normal appearance.  Cardiovascular:     Rate and Rhythm: Normal rate and regular rhythm.     Heart sounds: No murmur heard. No gallop.      Comments: Frequent ectopy Pulmonary:     Effort: Pulmonary effort is normal.     Breath sounds: Normal breath sounds. No wheezing or rales.  Abdominal:     General: Bowel sounds are normal.     Palpations: Abdomen is soft. There is no mass.     Tenderness: There is no abdominal tenderness. There is no guarding.  Musculoskeletal:     Cervical back: Neck supple.     Right lower leg: No edema.     Left lower leg: No edema.  Neurological:     Mental Status: He is alert.            Assessment & Plan:

## 2020-12-08 ENCOUNTER — Encounter: Payer: Self-pay | Admitting: Cardiovascular Disease

## 2020-12-08 NOTE — Progress Notes (Signed)
Cardiology Office Note   Date:  12/09/2020   ID:  Leon, Estes 08-16-1930, MRN [id number]  PCP:  Venia Carbon, MD  Cardiologist:   Mertie Moores, MD / previously saw [name], MD at [facility] Complaint  Patient presents with  . Coronary Artery Disease   Problem list: 1. Coronary artery disease-status post stenting of the right coronary artery in 1997 2. COPD 3. Hyperlipidemia     Leon Estes is a 87 y.o. male who presents for follow up of his CP He had am MI in 1997.  Had 2 stents placed by Dr. Glade Estes. Hs seen Leon Estes in the past, wants to see a cardiologist here in [place] .  Has done well,  No further of CP.  Exercises regularly,  Mows 3 acres.   Goes bowling once a week.  Also has some pain down his leg.  Able to weed-eat without any problems  Sees Dr. Delfina Redwood. Was recently found to have mild anemia.  Was started on iron at that time .   January 13, 2016:  Doing well.  Still very active.  Mows his 3 acres and weed-eats .   Bowls on wednesdays   Aug. 28, 2018: Doing well,  No CP or dyspnea.  Still bowling - now bowls around 180 .  Sees Dr. Delfina Redwood  - manages his lipids   April 15, 2019: Leon Estes is seen today for follow-up visit. Doing well.   Has not been bowling - closed for COVID No CP or dyspnea  Has some dyspnea due to his COPD .  - takes an inhaler regularly  Has had some leg pain .  Has resolve   April 26, 2020: Leon Estes is seen back today for follow-up of his coronary artery disease.  He has some shortness of breath due to his COPD. Has generalized fatiuged  No angina started on antibiotics.   April 29,2022; Leon Estes is seen today for follow up of his CAD Has chronic dyspnea due to his COPD  Was hospitalized with RUQ pain , WBC 16K Was started on antibiotics. No need for surgery at this point .   There was a comment about PAF. I cannot see any ECGs that show atrial fib  He has ectopic atrial rhythm , PVCs on tele during the  hospitalization    Past Medical History:  Diagnosis Date  . Asthma   . BPH (benign prostatic hypertrophy)   . Cataract   . Chronic renal disease, stage III ([place])   . Chronic sinusitis   . COPD (chronic obstructive pulmonary disease) ([place])   . Coronary artery disease    2 stents  . Gall stones   . Hearing loss    DOES NOT WEAR HEARING AIDS  . High cholesterol   . Hypertension   . Macular degeneration    right eye    Past Surgical History:  Procedure Laterality Date  . APPENDECTOMY    . CORONARY ANGIOPLASTY WITH STENT PLACEMENT  1998   2 stents  . TRANSURETHRAL RESECTION OF PROSTATE       Current Outpatient Medications  Medication Sig Dispense Refill  . aspirin EC 81 MG tablet Take 81 mg by mouth daily.    . betamethasone dipropionate 0.05 % cream Apply topically 2 (two) times daily. 45 g 1  . budesonide-formoterol (SYMBICORT) 160-4.5 MCG/ACT inhaler INHALE 2 PUFFS INTO THE LUNGS TWICE DAILY 10.2 g 5  . gabapentin (NEURONTIN) 300 MG capsule Take 300 mg by  mouth at bedtime.    Marland Kitchen gentamicin cream (GARAMYCIN) 0.1 % APPLY 1 APPLICATION TOPICALLY TWICE DAILY 30 g 1  . lisinopril (ZESTRIL) 2.5 MG tablet Take 1 tablet (2.5 mg total) by mouth daily. 30 tablet 0  . metoprolol succinate (TOPROL-XL) 25 MG 24 hr tablet Take 1 tablet (25 mg total) by mouth daily. 30 tablet 11  . NONFORMULARY OR COMPOUNDED ITEM See pharmacy note 120 each 2  . pantoprazole (PROTONIX) 40 MG tablet TAKE 1 TABLET BY MOUTH ONCE A DAY 90 tablet 3  . simvastatin (ZOCOR) 40 MG tablet Take 40 mg by mouth every evening.    . tamsulosin (FLOMAX) 0.4 MG CAPS capsule TAKE 1 CAPSULE BY MOUTH ONCE DAILY 90 capsule 3  . traMADol (ULTRAM) 50 MG tablet 50 mg 2 (two) times daily as needed.      No current facility-administered medications for this visit.    Allergies:   Sulfa antibiotics, Neosporin [neomycin-bacitracin zn-polymyx], Augmentin [amoxicillin-pot clavulanate], and Terbinafine and related    Social  History:  The patient  reports that he quit smoking about 15 years ago. His smoking use included cigarettes. His smokeless tobacco use includes chew. He reports that he does not drink alcohol and does not use drugs.   Family History:  The patient's family history includes Diabetes in his father; Heart disease in his brother.    ROS:  Please see the history of present illness.  Noted in HPI.  Otherwise review of systems is negative.  Physical Exam: Blood pressure 126/60, pulse 75, height 5\' 7"  (1.702 m), weight 157 lb 9.6 oz (71.5 kg), SpO2 98 %.  GEN:  Well nourished, well developed in no acute distress HEENT: Normal NECK: No JVD; No carotid bruits LYMPHATICS: No lymphadenopathy CARDIAC: RRR , no murmurs, rubs, gallops RESPIRATORY:  Clear to auscultation without rales, wheezing or rhonchi  ABDOMEN: Soft, non-tender, non-distended MUSCULOSKELETAL:  No edema; No deformity  SKIN: Warm and dry NEUROLOGIC:  Alert and oriented x 3   EKG:    December 09, 2020: Ectopic atrial rhythm.  Right bundle branch block.    Recent Labs: 11/28/2020: Magnesium 2.0 11/29/2020: ALT 19 12/06/2020: BUN 20; Creatinine, Ser 1.30; Hemoglobin 11.7; Platelets 361.0; Potassium 4.1; Sodium 138    Lipid Panel No results found for: CHOL, TRIG, HDL, CHOLHDL, VLDL, LDLCALC, LDLDIRECT    Wt Readings from Last 3 Encounters:  12/09/20 157 lb 9.6 oz (71.5 kg)  12/06/20 159 lb (72.1 kg)  11/26/20 160 lb (72.6 kg)      Other studies Reviewed: Additional studies/ records that were reviewed today include: . Review of the above records demonstrates:    ASSESSMENT AND PLAN:  1. Coronary artery disease- .  He denies having angian . Will see him in 1 year    2.  Possible paroxysmal atrial fib:   He has ectopic atrial rhythm,  PVCs n tele.  I did not see any clear evidence of atrial fib  I do niot think he needs to be started on oral anticoagulation at this time     2. COPD -    Plans per primary MD   3.  Hyperlipidemia -    Lipids have been stable   Current medicines are reviewed at length with the patient today.  The patient does not have concerns regarding medicines.  The following changes have been made:  no change  Labs/ tests ordered today include:   Orders Placed This Encounter  Procedures  . EKG 12-Lead  Disposition:      Mertie Moores, MD  12/09/2020 1:50 PM    [facility] [address], [place], [place]  [id number] Phone: [phone]; Fax: [phone]

## 2020-12-09 ENCOUNTER — Encounter: Payer: Self-pay | Admitting: Cardiovascular Disease

## 2020-12-09 ENCOUNTER — Ambulatory Visit: Payer: PPO | Admitting: Cardiovascular Disease

## 2020-12-09 ENCOUNTER — Other Ambulatory Visit: Payer: Self-pay

## 2020-12-09 VITALS — BP 126/60 | HR 75 | Ht 67.0 in | Wt 157.6 lb

## 2020-12-09 DIAGNOSIS — I2510 Atherosclerotic heart disease of native coronary artery without angina pectoris: Secondary | ICD-10-CM

## 2020-12-12 ENCOUNTER — Telehealth: Payer: Self-pay | Admitting: Internal Medicine

## 2020-12-12 DIAGNOSIS — K810 Acute cholecystitis: Secondary | ICD-10-CM

## 2020-12-12 NOTE — Telephone Encounter (Signed)
Leon Estes called in wanted to get a referral for a gastroenterologist and the dr name is [facility].

## 2020-12-13 DIAGNOSIS — K8050 Calculus of bile duct without cholangitis or cholecystitis without obstruction: Secondary | ICD-10-CM | POA: Diagnosis not present

## 2020-12-13 DIAGNOSIS — K810 Acute cholecystitis: Secondary | ICD-10-CM | POA: Diagnosis not present

## 2020-12-13 DIAGNOSIS — R948 Abnormal results of function studies of other organs and systems: Secondary | ICD-10-CM | POA: Diagnosis not present

## 2020-12-13 DIAGNOSIS — K820 Obstruction of gallbladder: Secondary | ICD-10-CM | POA: Diagnosis not present

## 2020-12-16 ENCOUNTER — Telehealth: Payer: Self-pay | Admitting: Internal Medicine

## 2020-12-22 ENCOUNTER — Ambulatory Visit: Payer: Self-pay | Admitting: General Surgery

## 2020-12-22 DIAGNOSIS — J449 Chronic obstructive pulmonary disease, unspecified: Secondary | ICD-10-CM | POA: Diagnosis not present

## 2020-12-22 DIAGNOSIS — H9190 Unspecified hearing loss, unspecified ear: Secondary | ICD-10-CM | POA: Diagnosis not present

## 2020-12-22 DIAGNOSIS — I2510 Atherosclerotic heart disease of native coronary artery without angina pectoris: Secondary | ICD-10-CM | POA: Diagnosis not present

## 2020-12-22 DIAGNOSIS — K8020 Calculus of gallbladder without cholecystitis without obstruction: Secondary | ICD-10-CM | POA: Diagnosis not present

## 2020-12-22 NOTE — H&P (Signed)
[facility] Appointment: 12/22/2020 9:45 AM Location: [facility] Patient #: [id number] DOB: 11-26-1930 Married / Language: Cleophus Molt / Race: White Male  History of Present Illness Leon Hiss M. British Moyd MD; 12/22/2020 9:58 AM) The patient is a 87 year old male who presents for evaluation of gall stones. he is referred by Gerrit Heck PA at [facility] for gallbladder surgery. He is accompanied by his wife. He is very hard of hearing. He was admitted to [facility] on April 16 2 the 19th for 'equivocal' cholecystitis. He had an elevated white blood cell count of 16,000 that increased to 18,000 but subsequently decreased to 12,000. He was treated with antibiotics. He had an ultrasound that showed a contracted gallbladder and gallstone. He had a nuclear medicine scan that showed no cystic duct activity consistent with a cystic duct obstruction. Reportedly he refuses surgical intervention and he was discharged on oral antibiotics. He has seen his primary care doctor, cardiology and gastrology after discharge. His LFTs were normal. Reportedly the patient and wife were unaware that they had refused surgery. He had presented with upper abdominal discomfort. He does relate some episodes of postprandial upper abdominal discomfort over the past few months. Again getting a history from his low bit challenging because of his hearing. No nausea or vomiting. No fever or chills.  no prior abdominal surgery. No chest pain. He is quite active for his age. No blood thinners. No TIAs or amaurosis fugax.   Problem List/Past Medical Leon Hiss M. Redmond Pulling, MD; 12/22/2020 9:58 AM) CHRONIC OBSTRUCTIVE PULMONARY DISEASE, UNSPECIFIED COPD TYPE (J44.9) CAD IN NATIVE ARTERY (I25.10) HARD OF HEARING (H91.90) SYMPTOMATIC CHOLELITHIASIS (K80.20)  Past Surgical History Janeann Forehand, CNA; 12/22/2020 9:28 AM) Bypass Surgery for Poor Blood Flow to Legs Cataract Surgery Right. Coronary Artery Bypass  Graft  Diagnostic Studies History Janeann Forehand, CNA; 12/22/2020 9:28 AM) Colonoscopy never  Allergies Janeann Forehand, CNA; 12/22/2020 9:29 AM) Sulfa 10 *OPHTHALMIC AGENTS* Amoxicill-Clarithro-Lansopraz *ULCER DRUGS/ANTISPASMODICS/ANTICHOLINERGICS* penicillAMINE *CHEMICALS* Allergies Reconciled  Medication History Janeann Forehand, CNA; 12/22/2020 9:29 AM) Albuterol Sulfate HFA (108 (90 Base)MCG/ACT Aerosol Soln, Inhalation) Active. Betamethasone Dipropionate (0.05% Emulsion, External) Active. Betamethasone Dipropionate (0.05% Cream, External) Active. Budesonide-Formoterol Fumarate (80-4.5MCG/ACT Aerosol, Inhalation) Active. Budesonide-Formoterol Fumarate (160-4.5MCG/ACT Aerosol, Inhalation) Active. Cephalexin (500MG  Tablet, Oral) Active. Cephalexin (500MG  Tablet, Oral) Active. Cephalexin (500MG  Capsule, Oral) Active. Ciprofloxacin HCl (500MG  Tablet, Oral) Active. Clindamycin HCl (300MG  Capsule, Oral) Active. Doxycycline Hyclate (100MG  Tablet, Oral) Active. Fluconazole (100MG  Tablet, Oral) Active. Gentamicin Sulfate (0.1% Cream, External) Active. Ketoconazole (2% Cream, External) Active. Lisinopril (2.5MG  Tablet, Oral) Active. Lisinopril-hydroCHLOROthiazide (20-25MG  Tablet, Oral) Active. Metoprolol Succinate ER (25MG  Tablet ER 24HR, Oral) Active. metroNIDAZOLE (500MG  Tablet, Oral) Active. Pantoprazole Sodium (40MG  Tablet DR, Oral) Active. Simvastatin (40MG  Tablet, Oral) Active. Tamsulosin HCl (0.4MG  Capsule, Oral) Active. Medications Reconciled  Social History Janeann Forehand, CNA; 12/22/2020 9:28 AM) Caffeine use Carbonated beverages, Coffee. No drug use Tobacco use Former smoker.  Family History Janeann Forehand, CNA; 12/22/2020 9:28 AM) Diabetes Mellitus Father. Heart Disease Father.  Other Problems Leon Hiss M. Redmond Pulling, MD; 12/22/2020 9:58 AM) Cholelithiasis Chronic Obstructive Lung Disease Gastroesophageal Reflux Disease High blood  pressure Hypercholesterolemia Myocardial infarction     Review of Systems Leon Hiss M. Johnpatrick Jenny MD; 12/22/2020 9:58 AM) General Present- Fatigue. Not Present- Appetite Loss, Chills, Fever, Night Sweats, Weight Gain and Weight Loss. Skin Not Present- Change in Wart/Mole, Dryness, Hives, Jaundice, New Lesions, Non-Healing Wounds, Rash and Ulcer. HEENT Present- Hearing Loss, Ringing in the Ears, Seasonal Allergies and Sinus Pain. Not Present- Earache, Hoarseness, Nose Bleed, Oral  Ulcers, Sore Throat, Visual Disturbances, Wears glasses/contact lenses and Yellow Eyes. Respiratory Present- Difficulty Breathing and Wheezing. Not Present- Bloody sputum, Chronic Cough and Snoring. Breast Not Present- Breast Mass, Breast Pain, Nipple Discharge and Skin Changes. Cardiovascular Present- Shortness of Breath. Not Present- Chest Pain, Difficulty Breathing Lying Down, Leg Cramps, Palpitations, Rapid Heart Rate and Swelling of Extremities. Gastrointestinal Present- Abdominal Pain. Not Present- Bloating, Bloody Stool, Change in Bowel Habits, Chronic diarrhea, Constipation, Difficulty Swallowing, Excessive gas, Gets full quickly at meals, Hemorrhoids, Indigestion, Nausea, Rectal Pain and Vomiting. Male Genitourinary Present- Frequency. Not Present- Blood in Urine, Change in Urinary Stream, Impotence, Nocturia, Painful Urination, Urgency and Urine Leakage. Musculoskeletal Present- Muscle Weakness. Not Present- Back Pain, Joint Pain, Joint Stiffness, Muscle Pain and Swelling of Extremities. Neurological Present- Trouble walking and Weakness. Not Present- Decreased Memory, Fainting, Headaches, Numbness, Seizures, Tingling and Tremor. Psychiatric Not Present- Anxiety, Bipolar, Change in Sleep Pattern, Depression, Fearful and Frequent crying. Endocrine Not Present- Cold Intolerance, Excessive Hunger, Hair Changes, Heat Intolerance, Hot flashes and New Diabetes. Hematology Present- Blood Thinners. Not Present- Easy Bruising,  Excessive bleeding, Gland problems, HIV and Persistent Infections. All other systems negative  Vitals Adriana Reams Alston CNA; 12/22/2020 9:30 AM) 12/22/2020 9:29 AM Weight: 157 lb Height: 66in Body Surface Area: 1.8 m Body Mass Index: 25.34 kg/m  Temp.: 98.8F  Pulse: 78 (Regular)  P.OX: 95% (Room air) BP: 140/80(Sitting, Left Arm, Standard)        Physical Exam Leon Hiss M. Tavien Chestnut MD; 12/22/2020 9:58 AM)  General Mental Status-Alert. General Appearance-Consistent with stated age. Hydration-Well hydrated. Voice-Normal.  Head and Neck Head-normocephalic, atraumatic with no lesions or palpable masses. Trachea-midline. Thyroid Gland Characteristics - normal size and consistency.  Eye Eyeball - Bilateral-Extraocular movements intact. Sclera/Conjunctiva - Bilateral-No scleral icterus.  Chest and Lung Exam Chest and lung exam reveals -quiet, even and easy respiratory effort with no use of accessory muscles and on auscultation, normal breath sounds, no adventitious sounds and normal vocal resonance. Inspection Chest Wall - Normal. Back - normal.  Breast - Did not examine.  Cardiovascular Cardiovascular examination reveals -normal heart sounds, regular rate and rhythm with no murmurs and normal pedal pulses bilaterally.  Abdomen Inspection Inspection of the abdomen reveals - No Hernias. Skin - Scar - no surgical scars. Palpation/Percussion Palpation and Percussion of the abdomen reveal - Soft, Non Tender, No Rebound tenderness, No Rigidity (guarding) and No hepatosplenomegaly. Auscultation Auscultation of the abdomen reveals - Bowel sounds normal.  Peripheral Vascular Upper Extremity Palpation - Pulses bilaterally normal.  Neurologic Neurologic evaluation reveals -alert and oriented x 3 with no impairment of recent or remote memory. Mental Status-Normal.  Neuropsychiatric The patient's mood and affect are described as  -normal. Judgment and Insight-insight is appropriate concerning matters relevant to self.  Musculoskeletal Normal Exam - Left-Upper Extremity Strength Normal and Lower Extremity Strength Normal. Normal Exam - Right-Upper Extremity Strength Normal and Lower Extremity Strength Normal.  Lymphatic Head & Neck  General Head & Neck Lymphatics: Bilateral - Description - Normal. Axillary - Did not examine. Femoral & Inguinal - Did not examine.    Assessment & Plan Leon Hiss M. Kwabena Strutz MD; 12/22/2020 9:55 AM)  SYMPTOMATIC CHOLELITHIASIS (K80.20) Impression: I believe the patient's symptoms are consistent with gallbladder disease.  We discussed gallbladder disease. The patient was given Neurosurgeon. We discussed non-operative and operative management. We discussed the signs & symptoms of acute cholecystitis  I discussed laparoscopic cholecystectomy with IOC in detail. The patient was given educational material as well as diagrams  detailing the procedure. We discussed the risks and benefits of a laparoscopic cholecystectomy including, but not limited to bleeding, infection, injury to surrounding structures such as the intestine or liver, bile leak, retained gallstones, need to convert to an open procedure, prolonged diarrhea, blood clots such as DVT, common bile duct injury, anesthesia risks, and possible need for additional procedures. We discussed the typical post-operative recovery course. I explained that the likelihood of improvement of their symptoms is good.  The patient has elected to proceed with surgery.  This patient encounter took 30 minutes today to perform the following: take history, perform exam, review outside records, interpret imaging, counsel the patient on their diagnosis and document encounter, findings & plan in the EHR  Current Plans Pt Education - Pamphlet Given - Laparoscopic Gallbladder Surgery: discussed with patient and provided information. You are being  scheduled for surgery- Our schedulers will call you.  You should hear from our office's scheduling department within 5 working days about the location, date, and time of surgery. We try to make accommodations for patient's preferences in scheduling surgery, but sometimes the OR schedule or the surgeon's schedule prevents [place] from making those accommodations.  If you have not heard from our office [phone]) in 5 working days, call the office and ask for your surgeon's nurse.  If you have other questions about your diagnosis, plan, or surgery, call the office and ask for your surgeon's nurse.   CAD IN NATIVE ARTERY (I25.10)   CHRONIC OBSTRUCTIVE PULMONARY DISEASE, UNSPECIFIED COPD TYPE (J44.9)   HARD OF HEARING (H91.90)  Leighton Ruff. Redmond Pulling, MD, FACS General, Bariatric, & Minimally Invasive Surgery [facility], [place]

## 2020-12-27 ENCOUNTER — Other Ambulatory Visit: Payer: Self-pay

## 2020-12-27 ENCOUNTER — Emergency Department (HOSPITAL_COMMUNITY): Payer: PPO

## 2020-12-27 ENCOUNTER — Telehealth: Payer: Self-pay

## 2020-12-27 ENCOUNTER — Inpatient Hospital Stay (HOSPITAL_COMMUNITY)
Admission: EM | Admit: 2020-12-27 | Discharge: 2021-01-23 | DRG: 871 | Disposition: A | Discharge status: Home Health | Payer: PPO | Attending: Internal Medicine | Admitting: Internal Medicine

## 2020-12-27 ENCOUNTER — Encounter (HOSPITAL_COMMUNITY): Payer: Self-pay | Admitting: Emergency Medicine

## 2020-12-27 ENCOUNTER — Inpatient Hospital Stay (HOSPITAL_COMMUNITY): Payer: PPO

## 2020-12-27 DIAGNOSIS — E871 Hypo-osmolality and hyponatremia: Secondary | ICD-10-CM | POA: Diagnosis not present

## 2020-12-27 DIAGNOSIS — I129 Hypertensive chronic kidney disease with stage 1 through stage 4 chronic kidney disease, or unspecified chronic kidney disease: Secondary | ICD-10-CM | POA: Diagnosis not present

## 2020-12-27 DIAGNOSIS — Z95828 Presence of other vascular implants and grafts: Secondary | ICD-10-CM

## 2020-12-27 DIAGNOSIS — N1831 Chronic kidney disease, stage 3a: Secondary | ICD-10-CM | POA: Diagnosis present

## 2020-12-27 DIAGNOSIS — R1084 Generalized abdominal pain: Secondary | ICD-10-CM | POA: Diagnosis not present

## 2020-12-27 DIAGNOSIS — H9190 Unspecified hearing loss, unspecified ear: Secondary | ICD-10-CM | POA: Diagnosis not present

## 2020-12-27 DIAGNOSIS — R531 Weakness: Secondary | ICD-10-CM

## 2020-12-27 DIAGNOSIS — A414 Sepsis due to anaerobes: Secondary | ICD-10-CM | POA: Diagnosis not present

## 2020-12-27 DIAGNOSIS — J329 Chronic sinusitis, unspecified: Secondary | ICD-10-CM | POA: Diagnosis present

## 2020-12-27 DIAGNOSIS — N179 Acute kidney failure, unspecified: Secondary | ICD-10-CM | POA: Diagnosis present

## 2020-12-27 DIAGNOSIS — R140 Abdominal distension (gaseous): Secondary | ICD-10-CM | POA: Diagnosis not present

## 2020-12-27 DIAGNOSIS — R0902 Hypoxemia: Secondary | ICD-10-CM | POA: Diagnosis not present

## 2020-12-27 DIAGNOSIS — K8013 Calculus of gallbladder with acute and chronic cholecystitis with obstruction: Secondary | ICD-10-CM | POA: Diagnosis not present

## 2020-12-27 DIAGNOSIS — A0472 Enterocolitis due to Clostridium difficile, not specified as recurrent: Secondary | ICD-10-CM | POA: Diagnosis not present

## 2020-12-27 DIAGNOSIS — K5931 Toxic megacolon: Secondary | ICD-10-CM | POA: Diagnosis not present

## 2020-12-27 DIAGNOSIS — R509 Fever, unspecified: Secondary | ICD-10-CM | POA: Diagnosis not present

## 2020-12-27 DIAGNOSIS — I480 Paroxysmal atrial fibrillation: Secondary | ICD-10-CM | POA: Diagnosis not present

## 2020-12-27 DIAGNOSIS — Z7951 Long term (current) use of inhaled steroids: Secondary | ICD-10-CM

## 2020-12-27 DIAGNOSIS — K810 Acute cholecystitis: Secondary | ICD-10-CM | POA: Diagnosis not present

## 2020-12-27 DIAGNOSIS — Z79899 Other long term (current) drug therapy: Secondary | ICD-10-CM

## 2020-12-27 DIAGNOSIS — K567 Ileus, unspecified: Secondary | ICD-10-CM | POA: Diagnosis not present

## 2020-12-27 DIAGNOSIS — N401 Enlarged prostate with lower urinary tract symptoms: Secondary | ICD-10-CM | POA: Diagnosis not present

## 2020-12-27 DIAGNOSIS — N400 Enlarged prostate without lower urinary tract symptoms: Secondary | ICD-10-CM | POA: Diagnosis not present

## 2020-12-27 DIAGNOSIS — I4891 Unspecified atrial fibrillation: Secondary | ICD-10-CM | POA: Diagnosis not present

## 2020-12-27 DIAGNOSIS — H3530 Unspecified macular degeneration: Secondary | ICD-10-CM | POA: Diagnosis not present

## 2020-12-27 DIAGNOSIS — R0600 Dyspnea, unspecified: Secondary | ICD-10-CM

## 2020-12-27 DIAGNOSIS — I25119 Atherosclerotic heart disease of native coronary artery with unspecified angina pectoris: Secondary | ICD-10-CM | POA: Diagnosis present

## 2020-12-27 DIAGNOSIS — R6520 Severe sepsis without septic shock: Secondary | ICD-10-CM | POA: Diagnosis not present

## 2020-12-27 DIAGNOSIS — E870 Hyperosmolality and hypernatremia: Secondary | ICD-10-CM | POA: Diagnosis not present

## 2020-12-27 DIAGNOSIS — E876 Hypokalemia: Secondary | ICD-10-CM | POA: Diagnosis not present

## 2020-12-27 DIAGNOSIS — K5939 Other megacolon: Secondary | ICD-10-CM | POA: Diagnosis not present

## 2020-12-27 DIAGNOSIS — J449 Chronic obstructive pulmonary disease, unspecified: Secondary | ICD-10-CM | POA: Diagnosis present

## 2020-12-27 DIAGNOSIS — E1122 Type 2 diabetes mellitus with diabetic chronic kidney disease: Secondary | ICD-10-CM | POA: Diagnosis present

## 2020-12-27 DIAGNOSIS — N281 Cyst of kidney, acquired: Secondary | ICD-10-CM | POA: Diagnosis not present

## 2020-12-27 DIAGNOSIS — R410 Disorientation, unspecified: Secondary | ICD-10-CM

## 2020-12-27 DIAGNOSIS — A419 Sepsis, unspecified organism: Secondary | ICD-10-CM | POA: Diagnosis not present

## 2020-12-27 DIAGNOSIS — Z66 Do not resuscitate: Secondary | ICD-10-CM | POA: Diagnosis not present

## 2020-12-27 DIAGNOSIS — I2510 Atherosclerotic heart disease of native coronary artery without angina pectoris: Secondary | ICD-10-CM | POA: Diagnosis present

## 2020-12-27 DIAGNOSIS — K8020 Calculus of gallbladder without cholecystitis without obstruction: Secondary | ICD-10-CM | POA: Diagnosis not present

## 2020-12-27 DIAGNOSIS — M47816 Spondylosis without myelopathy or radiculopathy, lumbar region: Secondary | ICD-10-CM | POA: Diagnosis not present

## 2020-12-27 DIAGNOSIS — R0603 Acute respiratory distress: Secondary | ICD-10-CM | POA: Diagnosis not present

## 2020-12-27 DIAGNOSIS — R062 Wheezing: Secondary | ICD-10-CM | POA: Diagnosis not present

## 2020-12-27 DIAGNOSIS — K6389 Other specified diseases of intestine: Secondary | ICD-10-CM | POA: Diagnosis not present

## 2020-12-27 DIAGNOSIS — Z20822 Contact with and (suspected) exposure to covid-19: Secondary | ICD-10-CM | POA: Diagnosis present

## 2020-12-27 DIAGNOSIS — Z515 Encounter for palliative care: Secondary | ICD-10-CM

## 2020-12-27 DIAGNOSIS — I10 Essential (primary) hypertension: Secondary | ICD-10-CM | POA: Diagnosis present

## 2020-12-27 DIAGNOSIS — J9601 Acute respiratory failure with hypoxia: Secondary | ICD-10-CM | POA: Diagnosis present

## 2020-12-27 DIAGNOSIS — K812 Acute cholecystitis with chronic cholecystitis: Secondary | ICD-10-CM | POA: Diagnosis not present

## 2020-12-27 DIAGNOSIS — Z7982 Long term (current) use of aspirin: Secondary | ICD-10-CM

## 2020-12-27 DIAGNOSIS — E785 Hyperlipidemia, unspecified: Secondary | ICD-10-CM | POA: Diagnosis present

## 2020-12-27 DIAGNOSIS — Z888 Allergy status to other drugs, medicaments and biological substances status: Secondary | ICD-10-CM

## 2020-12-27 DIAGNOSIS — Z955 Presence of coronary angioplasty implant and graft: Secondary | ICD-10-CM

## 2020-12-27 DIAGNOSIS — Z7189 Other specified counseling: Secondary | ICD-10-CM

## 2020-12-27 DIAGNOSIS — Z882 Allergy status to sulfonamides status: Secondary | ICD-10-CM

## 2020-12-27 DIAGNOSIS — Z87891 Personal history of nicotine dependence: Secondary | ICD-10-CM

## 2020-12-27 DIAGNOSIS — E7800 Pure hypercholesterolemia, unspecified: Secondary | ICD-10-CM | POA: Diagnosis present

## 2020-12-27 DIAGNOSIS — K529 Noninfective gastroenteritis and colitis, unspecified: Secondary | ICD-10-CM | POA: Diagnosis not present

## 2020-12-27 DIAGNOSIS — R0689 Other abnormalities of breathing: Secondary | ICD-10-CM | POA: Diagnosis not present

## 2020-12-27 DIAGNOSIS — R197 Diarrhea, unspecified: Secondary | ICD-10-CM | POA: Diagnosis not present

## 2020-12-27 DIAGNOSIS — I517 Cardiomegaly: Secondary | ICD-10-CM | POA: Diagnosis not present

## 2020-12-27 DIAGNOSIS — N183 Chronic kidney disease, stage 3 unspecified: Secondary | ICD-10-CM | POA: Diagnosis present

## 2020-12-27 DIAGNOSIS — I959 Hypotension, unspecified: Secondary | ICD-10-CM | POA: Diagnosis not present

## 2020-12-27 DIAGNOSIS — Z9049 Acquired absence of other specified parts of digestive tract: Secondary | ICD-10-CM

## 2020-12-27 DIAGNOSIS — R000 Tachycardia, unspecified: Secondary | ICD-10-CM | POA: Diagnosis not present

## 2020-12-27 DIAGNOSIS — I491 Atrial premature depolarization: Secondary | ICD-10-CM | POA: Diagnosis not present

## 2020-12-27 DIAGNOSIS — Z881 Allergy status to other antibiotic agents status: Secondary | ICD-10-CM

## 2020-12-27 LAB — CBC WITH DIFFERENTIAL/PLATELET
Abs Immature Granulocytes: 0.07 10*3/uL (ref 0.00–0.07)
Basophils Absolute: 0.1 10*3/uL (ref 0.0–0.1)
Basophils Relative: 1 %
Eosinophils Absolute: 0 10*3/uL (ref 0.0–0.5)
Eosinophils Relative: 0 %
HCT: 42.1 % (ref 39.0–52.0)
Hemoglobin: 12.9 g/dL — ABNORMAL LOW (ref 13.0–17.0)
Immature Granulocytes: 1 %
Lymphocytes Relative: 15 %
Lymphs Abs: 2.2 10*3/uL (ref 0.7–4.0)
MCH: 28.5 pg (ref 26.0–34.0)
MCHC: 30.6 g/dL (ref 30.0–36.0)
MCV: 93.1 fL (ref 80.0–100.0)
Monocytes Absolute: 1.8 10*3/uL — ABNORMAL HIGH (ref 0.1–1.0)
Monocytes Relative: 12 %
Neutro Abs: 11.2 10*3/uL — ABNORMAL HIGH (ref 1.7–7.7)
Neutrophils Relative %: 71 %
Platelets: 238 10*3/uL (ref 150–400)
RBC: 4.52 MIL/uL (ref 4.22–5.81)
RDW: 14.5 % (ref 11.5–15.5)
WBC: 15.3 10*3/uL — ABNORMAL HIGH (ref 4.0–10.5)
nRBC: 0 % (ref 0.0–0.2)

## 2020-12-27 LAB — COMPREHENSIVE METABOLIC PANEL
ALT: 19 U/L (ref 0–44)
AST: 31 U/L (ref 15–41)
Albumin: 4 g/dL (ref 3.5–5.0)
Alkaline Phosphatase: 66 U/L (ref 38–126)
Anion gap: 9 (ref 5–15)
BUN: 22 mg/dL (ref 8–23)
CO2: 24 mmol/L (ref 22–32)
Calcium: 8.7 mg/dL — ABNORMAL LOW (ref 8.9–10.3)
Chloride: 106 mmol/L (ref 98–111)
Creatinine, Ser: 1.48 mg/dL — ABNORMAL HIGH (ref 0.61–1.24)
GFR, Estimated: 46 mL/min — ABNORMAL LOW (ref >60)
Glucose, Bld: 133 mg/dL — ABNORMAL HIGH (ref 70–99)
Potassium: 4.1 mmol/L (ref 3.5–5.1)
Sodium: 139 mmol/L (ref 135–145)
Total Bilirubin: 0.7 mg/dL (ref 0.3–1.2)
Total Protein: 7.6 g/dL (ref 6.5–8.1)

## 2020-12-27 LAB — LACTIC ACID, PLASMA
Lactic Acid, Venous: 3.2 mmol/L (ref 0.5–1.9)
Lactic Acid, Venous: 2.8 mmol/L (ref 0.5–1.9)
Lactic Acid, Venous: 2.5 mmol/L (ref 0.5–1.9)

## 2020-12-27 LAB — URINALYSIS, ROUTINE W REFLEX MICROSCOPIC
Bacteria, UA: NONE SEEN
Bilirubin Urine: NEGATIVE
Glucose, UA: NEGATIVE mg/dL
Ketones, ur: 5 mg/dL — AB
Leukocytes,Ua: NEGATIVE
Nitrite: NEGATIVE
Protein, ur: >=300 mg/dL — AB
Specific Gravity, Urine: 1.039 — ABNORMAL HIGH (ref 1.005–1.030)
pH: 5 (ref 5.0–8.0)

## 2020-12-27 LAB — RESP PANEL BY RT-PCR (FLU A&B, COVID) ARPGX2
Influenza A by PCR: NEGATIVE
Influenza B by PCR: NEGATIVE
SARS Coronavirus 2 by RT PCR: NEGATIVE

## 2020-12-27 LAB — APTT: aPTT: 41 seconds — ABNORMAL HIGH (ref 24–36)

## 2020-12-27 LAB — PROTIME-INR
INR: 1.3 — ABNORMAL HIGH (ref 0.8–1.2)
Prothrombin Time: 16.3 seconds — ABNORMAL HIGH (ref 11.4–15.2)

## 2020-12-27 LAB — LIPASE, BLOOD: Lipase: 22 U/L (ref 11–51)

## 2020-12-27 LAB — BRAIN NATRIURETIC PEPTIDE: B Natriuretic Peptide: 689.3 pg/mL — ABNORMAL HIGH (ref 0.0–100.0)

## 2020-12-27 MED ORDER — TAMSULOSIN HCL 0.4 MG PO CAPS
0.4000 mg | ORAL_CAPSULE | Freq: Every day | ORAL | Status: DC
  Administered 2020-12-28 – 2021-01-23 (×19): 0.4 mg via ORAL
  Filled 2020-12-27 (×19): qty 1

## 2020-12-27 MED ORDER — PANTOPRAZOLE SODIUM 40 MG PO TBEC
40.0000 mg | DELAYED_RELEASE_TABLET | Freq: Every day | ORAL | Status: DC
  Administered 2020-12-28 – 2021-01-08 (×5): 40 mg via ORAL
  Filled 2020-12-27 (×5): qty 1

## 2020-12-27 MED ORDER — SODIUM CHLORIDE 0.9 % IV SOLN
2.0000 g | Freq: Two times a day (BID) | INTRAVENOUS | Status: DC
  Administered 2020-12-28 – 2020-12-29 (×3): 2 g via INTRAVENOUS
  Filled 2020-12-27 (×3): qty 2

## 2020-12-27 MED ORDER — MOMETASONE FURO-FORMOTEROL FUM 200-5 MCG/ACT IN AERO
2.0000 | INHALATION_SPRAY | Freq: Two times a day (BID) | RESPIRATORY_TRACT | Status: DC
  Administered 2020-12-28 – 2020-12-29 (×4): 2 via RESPIRATORY_TRACT
  Filled 2020-12-27 (×2): qty 8.8

## 2020-12-27 MED ORDER — METOPROLOL SUCCINATE ER 25 MG PO TB24
25.0000 mg | ORAL_TABLET | Freq: Every day | ORAL | Status: DC
  Administered 2020-12-28 – 2020-12-29 (×2): 25 mg via ORAL
  Filled 2020-12-27 (×2): qty 1

## 2020-12-27 MED ORDER — LACTATED RINGERS IV SOLN: INTRAVENOUS | Status: DC

## 2020-12-27 MED ORDER — SODIUM CHLORIDE 0.9 % IV SOLN: 2.0000 g | Freq: Once | INTRAVENOUS | Status: DC

## 2020-12-27 MED ORDER — ONDANSETRON HCL 4 MG/2ML IJ SOLN
4.0000 mg | Freq: Four times a day (QID) | INTRAMUSCULAR | Status: DC | PRN
  Administered 2020-12-27: 4 mg via INTRAVENOUS
  Filled 2020-12-27: qty 2

## 2020-12-27 MED ORDER — SODIUM CHLORIDE 0.9% FLUSH
3.0000 mL | Freq: Two times a day (BID) | INTRAVENOUS | Status: DC
  Administered 2020-12-28 – 2021-01-20 (×37): 3 mL via INTRAVENOUS

## 2020-12-27 MED ORDER — LACTATED RINGERS IV BOLUS (SEPSIS)
1000.0000 mL | Freq: Once | INTRAVENOUS | Status: AC
  Administered 2020-12-27: 1000 mL via INTRAVENOUS

## 2020-12-27 MED ORDER — SIMVASTATIN 40 MG PO TABS
40.0000 mg | ORAL_TABLET | Freq: Every evening | ORAL | Status: DC
  Administered 2020-12-27 – 2021-01-22 (×21): 40 mg via ORAL
  Filled 2020-12-27 (×22): qty 1
  Filled 2020-12-27: qty 2

## 2020-12-27 MED ORDER — LACTATED RINGERS IV BOLUS (SEPSIS)
250.0000 mL | Freq: Once | INTRAVENOUS | Status: AC
  Administered 2020-12-27: 250 mL via INTRAVENOUS

## 2020-12-27 MED ORDER — VANCOMYCIN HCL IN DEXTROSE 1-5 GM/200ML-% IV SOLN
1000.0000 mg | Freq: Once | INTRAVENOUS | Status: AC
  Administered 2020-12-27: 1000 mg via INTRAVENOUS
  Filled 2020-12-27: qty 200

## 2020-12-27 MED ORDER — HEPARIN SODIUM (PORCINE) 5000 UNIT/ML IJ SOLN
5000.0000 [IU] | Freq: Three times a day (TID) | INTRAMUSCULAR | Status: DC
  Administered 2020-12-27 – 2021-01-08 (×34): 5000 [IU] via SUBCUTANEOUS
  Filled 2020-12-27 (×33): qty 1

## 2020-12-27 MED ORDER — METRONIDAZOLE 500 MG/100ML IV SOLN
500.0000 mg | Freq: Once | INTRAVENOUS | Status: AC
  Administered 2020-12-27: 500 mg via INTRAVENOUS
  Filled 2020-12-27: qty 100

## 2020-12-27 MED ORDER — ALBUTEROL SULFATE (2.5 MG/3ML) 0.083% IN NEBU
2.5000 mg | INHALATION_SOLUTION | RESPIRATORY_TRACT | Status: DC | PRN
  Administered 2020-12-30 – 2020-12-31 (×4): 2.5 mg via RESPIRATORY_TRACT
  Filled 2020-12-27 (×3): qty 3

## 2020-12-27 MED ORDER — LISINOPRIL 2.5 MG PO TABS
2.5000 mg | ORAL_TABLET | Freq: Every day | ORAL | Status: DC
  Filled 2020-12-27: qty 1

## 2020-12-27 MED ORDER — SODIUM CHLORIDE 0.9 % IV SOLN
2.0000 g | Freq: Once | INTRAVENOUS | Status: AC
  Administered 2020-12-27: 2 g via INTRAVENOUS
  Filled 2020-12-27: qty 2

## 2020-12-27 MED ORDER — VANCOMYCIN HCL 1250 MG/250ML IV SOLN: 1250.0000 mg | INTRAVENOUS | Status: DC

## 2020-12-27 MED ORDER — ASPIRIN EC 81 MG PO TBEC
81.0000 mg | DELAYED_RELEASE_TABLET | Freq: Every day | ORAL | Status: DC
  Administered 2020-12-28 – 2021-01-23 (×18): 81 mg via ORAL
  Filled 2020-12-27 (×20): qty 1

## 2020-12-27 MED ORDER — METRONIDAZOLE 500 MG/100ML IV SOLN
500.0000 mg | Freq: Three times a day (TID) | INTRAVENOUS | Status: DC
  Administered 2020-12-28 – 2020-12-29 (×4): 500 mg via INTRAVENOUS
  Filled 2020-12-27 (×4): qty 100

## 2020-12-27 MED ORDER — ACETAMINOPHEN 500 MG PO TABS
1000.0000 mg | ORAL_TABLET | Freq: Four times a day (QID) | ORAL | Status: DC | PRN
  Administered 2020-12-27 – 2021-01-16 (×8): 1000 mg via ORAL
  Filled 2020-12-27 (×7): qty 2

## 2020-12-27 NOTE — Progress Notes (Signed)
Notified provider of need to order antibiotics and fluids.

## 2020-12-27 NOTE — Telephone Encounter (Signed)
I spoke with Mrs Hoeg;(DPR signed) pts wife said his fever went down; Mrs Plemmons is not sure what temp is now but is sure it has come down a lot. Cannot ck temp at this time.pt has cooled off now after taking tylenol. pts wife said denied any other covid symptoms and pts wife thought the fever could be from gallstones. Pt has appt on 01/11/20 for gall bladder surgery.Mrs Landini said no abd pain, back pain or CP. NO N&V&diarrhea. Mrs Bellanca said she was going to call Dr Redmond Pulling the surgeon's office this afternoon to see if they wanted to do surgery sooner than the 01/10/21. UC & ED precautions given and pts wife voiced understanding. Sending note to Dr Silvio Pate and Larene Beach CMA. Pt last saw Dr Silvio Pate on 12/06/20. Pt will cb to [facility] if needed.

## 2020-12-27 NOTE — ED Notes (Signed)
PURE WICK IN PLACE WILL GIVE URINE SAMPLE WHEN ABLE. 

## 2020-12-27 NOTE — Sepsis Progress Note (Signed)
Notified provider of need to order repeat lactic acid. ° °

## 2020-12-27 NOTE — ED Triage Notes (Signed)
Pt arrived via EMS from home. Pt has hx of gallbladder disease. Pt has scheduled gallbladder removal in 2 weeks. Pt's wife called EMS because pt had a 103 temperature today. Pt has hx of COPD and CHF. Pt given 500cc of fluid by EMS.

## 2020-12-27 NOTE — Telephone Encounter (Signed)
[facility] - Client TELEPHONE ADVICE RECORD [facility] Patient Name: Leon Estes MMERS Gender: Male DOB: Aug 12, 1931 Age: 87 Y 73 M 10 D Return Phone Number: [phone] (Primary) Address: City/ State/ Zip: [place] [address] Client [place] [facility] - Client Client Site [facility] - Day Physician Viviana Simpler- MD Contact Type Call Who Is Calling Patient / Member / Family / Caregiver Call Type Triage / Clinical Caller Name [name] Relationship To Patient Spouse Return Phone Number [phone] (Primary) Chief Complaint Fever (non urgent symptom) (> THREE MONTHS) Reason for Call Symptomatic / Request for [place] states pt is experiencing a fever of 103.4. Caller was transferred from the office. Translation No Disp. Time Eilene Ghazi Time) Disposition Final User 12/27/2020 12:49:48 PM Attempt made - no message left Nyoka Cowden, RN, Ludger Nutting 12/27/2020 1:06:53 PM FINAL ATTEMPT MADE - no message left

## 2020-12-27 NOTE — Progress Notes (Signed)
Following for code sepsis 

## 2020-12-27 NOTE — Progress Notes (Signed)
Pharmacy Antibiotic Note  Leon Estes is a 87 y.o. male admitted on 12/27/2020 with medical history significant for CAD s/p PCI/stenting to RCA 1997, COPD, CKD stage III, HTN, HLD, BPH who presents to the ED for evaluation of fever and weakness..  Pharmacy has been consulted to dose vancomycin and cefepime.  Plan: Cefepime 2gm IV q12h Vancomycin 1gm x 1 then 1250mg  q36h (AUC 513, Scr 1.48) Flagyl 500mg  IV q8h per MD Follow renal function, cultures and clinical course   Height: 5\' 7"  (170.2 cm) Weight: 71.2 kg (157 lb) IBW/kg (Calculated) : 66.1  Temp (24hrs), Avg:101.9 F (38.8 C), Min:100 F (37.8 C), Max:103.1 F (39.5 C)  Recent Labs  Lab 12/27/20 1752 12/27/20 1952 12/27/20 2132  WBC 15.3*  --   --   CREATININE 1.48*  --   --   LATICACIDVEN 3.2* 2.8* 2.5*    Estimated Creatinine Clearance: 32.9 mL/min (A) (by C-G formula based on SCr of 1.48 mg/dL (H)).    Allergies  Allergen Reactions  . Sulfa Antibiotics Rash  . Benzalkonium Chloride     "Benzalkonium chloride is a quaternary ammonium antiseptic and disinfectant with actions and uses similar to those of other cationic surfactants. It is also used as an antimicrobial preservative for pharmaceutical products."  . Neosporin [Neomycin-Bacitracin Zn-Polymyx]   . Augmentin [Amoxicillin-Pot Clavulanate] Rash  . Terbinafine Rash  . Terbinafine And Related Rash    Antimicrobials this admission: 5/17 vanc >> 5/17 cefepime >> 5/17 flagyl >>  Dose adjustments this admission:   Microbiology results: 5/17 BCx:  5/17 UCx:   Thank you for allowing pharmacy to be a part of this patient's care.  Dolly Rias RPh 12/27/2020, 11:13 PM

## 2020-12-27 NOTE — ED Provider Notes (Signed)
[facility] DEPT Provider Note   CSN: [id number] Arrival date & time: 12/27/20  1704     History Chief Complaint  Patient presents with  . Code Sepsis    Leon Estes is a 87 y.o. male.  87 year old male with history of CHF and COPD presents with increasing weakness and temperature at home of 103 degrees.  Patient scheduled for gallbladder surgery in several weeks due to known history of cholelithiasis.  He denies any abdominal discomfort but has had some increased distention.  No emesis noted.  No bloody stools.  Has been increasingly short of breath and does not use home oxygen.  EMS called and patient given 500 cc of saline, and placed on oxygen due to hypoxemia and transported here        Past Medical History:  Diagnosis Date  . Asthma   . BPH (benign prostatic hypertrophy)   . Cataract   . Chronic renal disease, stage III ([place])   . Chronic sinusitis   . COPD (chronic obstructive pulmonary disease) ([place])   . Coronary artery disease    2 stents  . Gall stones   . Hearing loss    DOES NOT WEAR HEARING AIDS  . High cholesterol   . Hypertension   . Macular degeneration    right eye    Patient Active Problem List   Diagnosis Date Noted  . Arrhythmia 12/06/2020  . Pulmonary nodule 12/06/2020  . Acute cholecystitis 11/26/2020  . Preventative health care 10/12/2020  . Macular degeneration, wet ([place]) 10/12/2020  . Acquired claw toe, left 02/02/2020  . BPH (benign prostatic hyperplasia) 01/13/2020  . Neuropathy 02/18/2018  . COPD (chronic obstructive pulmonary disease) ([place])   . Chronic renal disease, stage III ([place])   . Benign essential HTN 04/09/2017  . CAD (coronary artery disease) 03/03/2015  . Hyperlipidemia 03/03/2015    Past Surgical History:  Procedure Laterality Date  . APPENDECTOMY    . CORONARY ANGIOPLASTY WITH STENT PLACEMENT  1998   2 stents  . TRANSURETHRAL RESECTION OF PROSTATE         Family History   Problem Relation Age of Onset  . Diabetes Father   . Heart disease Brother     Social History   Tobacco Use  . Smoking status: Former Smoker    Types: Cigarettes    Quit date: 08/13/2005    Years since quitting: 15.3  . Smokeless tobacco: Current User    Types: Chew  . Tobacco comment: discussed stopping this (only once in a while)  Vaping Use  . Vaping Use: Never used  Substance Use Topics  . Alcohol use: No  . Drug use: No    Home Medications Prior to Admission medications   Medication Sig Start Date End Date Taking? Authorizing Provider  aspirin EC 81 MG tablet Take 81 mg by mouth daily.    [provider]  betamethasone dipropionate 0.05 % cream Apply topically 2 (two) times daily. 08/23/20   Edrick Kins, DPM  budesonide-formoterol (SYMBICORT) 160-4.5 MCG/ACT inhaler INHALE 2 PUFFS INTO THE LUNGS TWICE DAILY 09/28/20   Venia Carbon, MD  gabapentin (NEURONTIN) 300 MG capsule Take 300 mg by mouth at bedtime.    [provider]  gentamicin cream (GARAMYCIN) 0.1 % APPLY 1 APPLICATION TOPICALLY TWICE DAILY 08/21/20   Edrick Kins, DPM  lisinopril (ZESTRIL) 2.5 MG tablet Take 1 tablet (2.5 mg total) by mouth daily. 11/30/20 12/30/20  Nolberto Hanlon, MD  metoprolol  succinate (TOPROL-XL) 25 MG 24 hr tablet Take 1 tablet (25 mg total) by mouth daily. 08/02/20   Venia Carbon, MD  NONFORMULARY OR COMPOUNDED ITEM See pharmacy note 06/25/19   Felipa Furnace, DPM  pantoprazole (PROTONIX) 40 MG tablet TAKE 1 TABLET BY MOUTH ONCE A DAY 08/15/20   Viviana Simpler I, MD  simvastatin (ZOCOR) 40 MG tablet Take 40 mg by mouth every evening.    [provider]  tamsulosin (FLOMAX) 0.4 MG CAPS capsule TAKE 1 CAPSULE BY MOUTH ONCE DAILY 07/14/20   Venia Carbon, MD  traMADol (ULTRAM) 50 MG tablet 50 mg 2 (two) times daily as needed.  08/15/17   [provider]    Allergies    Sulfa antibiotics, Neosporin [neomycin-bacitracin zn-polymyx], Augmentin  [amoxicillin-pot clavulanate], and Terbinafine and related  Review of Systems   Review of Systems  All other systems reviewed and are negative.   Physical Exam Updated Vital Signs BP (!) 142/88   Pulse 96   Temp (!) 102.6 F (39.2 C) (Rectal)   Resp 16   Ht 1.702 m (5\' 7" )   Wt 71.2 kg   SpO2 98%   BMI 24.59 kg/m   Physical Exam Vitals and nursing note reviewed.  Constitutional:      General: He is not in acute distress.    Appearance: Normal appearance. He is well-developed. He is not toxic-appearing.  HENT:     Head: Normocephalic and atraumatic.  Eyes:     General: Lids are normal.     Conjunctiva/sclera: Conjunctivae normal.     Pupils: Pupils are equal, round, and reactive to light.  Neck:     Thyroid: No thyroid mass.     Trachea: No tracheal deviation.  Cardiovascular:     Rate and Rhythm: Normal rate and regular rhythm.     Heart sounds: Normal heart sounds. No murmur heard. No gallop.   Pulmonary:     Effort: Tachypnea and respiratory distress present.     Breath sounds: No stridor. Decreased breath sounds present. No wheezing, rhonchi or rales.  Abdominal:     General: Bowel sounds are normal. There is no distension.     Palpations: Abdomen is soft.     Tenderness: There is no abdominal tenderness. There is no rebound.  Musculoskeletal:        General: No tenderness. Normal range of motion.     Cervical back: Normal range of motion and neck supple.  Skin:    General: Skin is warm and dry.     Findings: No abrasion or rash.  Neurological:     General: No focal deficit present.     Mental Status: He is alert and oriented to person, place, and time.     GCS: GCS eye subscore is 4. GCS verbal subscore is 5. GCS motor subscore is 6.     Cranial Nerves: No cranial nerve deficit.     Sensory: No sensory deficit.  Psychiatric:        Attention and Perception: Attention normal.        Speech: Speech normal.        Behavior: Behavior normal.     ED  Results / Procedures / Treatments   Labs (all labs ordered are listed, but only abnormal results are displayed) Labs Reviewed  RESP PANEL BY RT-PCR (FLU A&B, COVID) [id number]  CULTURE, BLOOD (ROUTINE X 2)  CULTURE, BLOOD (ROUTINE X 2)  URINE CULTURE  LACTIC ACID, PLASMA  LACTIC ACID,  PLASMA  COMPREHENSIVE METABOLIC PANEL  CBC WITH DIFFERENTIAL/PLATELET  PROTIME-INR  APTT  URINALYSIS, ROUTINE W REFLEX MICROSCOPIC  LIPASE, BLOOD  BRAIN NATRIURETIC PEPTIDE    EKG EKG Interpretation  Date/Time:  Tuesday Dec 27 2020 17:36:56 EDT Ventricular Rate:  120 PR Interval:    QRS Duration: 135 QT Interval:  359 QTC Calculation: 508 R Axis:   63 Text Interpretation: Atrial fibrillation Ventricular premature complex Right bundle branch block ST depr, consider ischemia, anterolateral lds Confirmed by Lacretia Leigh ([id number]) on 12/27/2020 5:59:05 PM   Radiology No results found.  Procedures Procedures   Medications Ordered in ED Medications  lactated ringers infusion (has no administration in time range)    ED Course  I have reviewed the triage vital signs and the nursing notes.  Pertinent labs & imaging results that were available during my care of the patient were reviewed by me and considered in my medical decision making (see chart for details).    MDM Rules/Calculators/A&P                         Abdominal CT results show stable findings. Code sepsis called here on patient.  Elevated lactate noted.  Chest x-ray without acute findings.  Patient started on empiric antibiotics.  Fluids were delayed until chest x-ray results were obtained due to history of CHF.  Will give slow hydration here.  Will admit patient to the hospital  CRITICAL CARE Performed by: Leota Jacobsen Total critical care time: 50 minutes Critical care time was exclusive of separately billable procedures and treating other patients. Critical care was necessary to treat or prevent imminent or life-threatening  deterioration. Critical care was time spent personally by me on the following activities: development of treatment plan with patient and/or surrogate as well as nursing, discussions with consultants, evaluation of patient's response to treatment, examination of patient, obtaining history from patient or surrogate, ordering and performing treatments and interventions, ordering and review of laboratory studies, ordering and review of radiographic studies, pulse oximetry and re-evaluation of patient's condition. Final Clinical Impression(s) / ED Diagnoses Final diagnoses:  None    Rx / DC Orders ED Discharge Orders    None       Lacretia Leigh, MD 12/27/20 2000

## 2020-12-27 NOTE — Telephone Encounter (Signed)
ERROR

## 2020-12-27 NOTE — H&P (Signed)
History and Physical    HAWARD POPE ZDG:[id number] DOB: [birth year] DOA: 12/27/2020  PCP: Venia Carbon, MD  Patient coming from: Home via EMS  I have personally briefly reviewed patient's old medical records in [facility]  Chief Complaint: Fever  HPI: Leon Estes is a 87 y.o. male with medical history significant for CAD s/p PCI/stenting to RCA 1997, COPD, CKD stage III, HTN, HLD, BPH who presents to the ED for evaluation of fever and weakness.  Patient was recently admitted to [facility] 11/26/2020-11/29/2020 for presumed acute cholecystitis.  RUQ ultrasound and CT abdomen/pelvis were equivocal.  HIDA scan was consistent with cystic duct obstruction.  Surgical intervention was deferred.  Patient was treated with antibiotics.  He was followed up with general surgery as an outpatient with plans for laparoscopic cholecystectomy on 01/10/2021.  Patient states when he woke up this morning he just felt generally weak.  He had fevers and diaphoresis.  He did not have any chest pain, dyspnea, cough, abdominal pain, dysuria.  He does report some recent diarrhea.  He has had nausea without emesis.  ED Course:  Initial vitals showed BP 142/88, pulse 96, RR 16, temp 100.0 F, SPO2 90% on room air.  T-max while in the ED was 103.1 F rectally.  Patient has been tachypneic with respiratory rate up to the 30s and tachycardic with pulse up to 130s.  Labs show WBC 15.3, hemoglobin 12.9, platelets 238,000, sodium 139, potassium 4.1, bicarb 24, BUN 22, creatinine 1.48, serum glucose 133.  LFTs are within normal limits.  Lactic acid 3.2 > 2.8.  BNP 689.3.  Blood cultures obtained and pending.  UA and urine culture ordered and pending collection.  SARS-CoV-2 PCR negative.  Influenza A/B-.  Portable chest x-ray is negative for focal consolidation, edema, effusion.  CT abdomen/pelvis without contrast shows prostatomegaly, stable appearance of the gallbladder with mild stable central intrahepatic  biliary dilatation, and extensive chronic and degenerative changes within the lower lumbar spine.  Patient was given empiric IV vancomycin, cefepime, Flagyl.  He was given 2.25 L LR followed by continuous maintenance fluids.  The hospitalist service was consulted to admit for further evaluation and management.  Review of Systems: All systems reviewed and are negative except as documented in history of present illness above.   Past Medical History:  Diagnosis Date  . Asthma   . BPH (benign prostatic hypertrophy)   . Cataract   . Chronic renal disease, stage III ([place])   . Chronic sinusitis   . COPD (chronic obstructive pulmonary disease) ([place])   . Coronary artery disease    2 stents  . Gall stones   . Hearing loss    DOES NOT WEAR HEARING AIDS  . High cholesterol   . Hypertension   . Macular degeneration    right eye    Past Surgical History:  Procedure Laterality Date  . APPENDECTOMY    . CORONARY ANGIOPLASTY WITH STENT PLACEMENT  1998   2 stents  . TRANSURETHRAL RESECTION OF PROSTATE      Social History:  reports that he quit smoking about 15 years ago. His smoking use included cigarettes. His smokeless tobacco use includes chew. He reports that he does not drink alcohol and does not use drugs.  Allergies  Allergen Reactions  . Sulfa Antibiotics Rash  . Benzalkonium Chloride     "Benzalkonium chloride is a quaternary ammonium antiseptic and disinfectant with actions and uses similar to those of other cationic surfactants. It is also  used as an antimicrobial preservative for pharmaceutical products."  . Neosporin [Neomycin-Bacitracin Zn-Polymyx]   . Augmentin [Amoxicillin-Pot Clavulanate] Rash  . Terbinafine Rash  . Terbinafine And Related Rash    Family History  Problem Relation Age of Onset  . Diabetes Father   . Heart disease Brother      Prior to Admission medications   Medication Sig Start Date End Date Taking? Authorizing Provider  aspirin EC 81 MG tablet  Take 81 mg by mouth daily.    [provider]  betamethasone dipropionate 0.05 % cream Apply topically 2 (two) times daily. 08/23/20   Edrick Kins, DPM  budesonide-formoterol (SYMBICORT) 160-4.5 MCG/ACT inhaler INHALE 2 PUFFS INTO THE LUNGS TWICE DAILY Patient taking differently: Inhale 2 puffs into the lungs 2 (two) times daily. 09/28/20   Venia Carbon, MD  gabapentin (NEURONTIN) 300 MG capsule Take 300 mg by mouth at bedtime.    [provider]  gentamicin cream (GARAMYCIN) 0.1 % APPLY 1 APPLICATION TOPICALLY TWICE DAILY 08/21/20   Edrick Kins, DPM  lisinopril (ZESTRIL) 2.5 MG tablet Take 1 tablet (2.5 mg total) by mouth daily. 11/30/20 12/30/20  Nolberto Hanlon, MD  metoprolol succinate (TOPROL-XL) 25 MG 24 hr tablet Take 1 tablet (25 mg total) by mouth daily. 08/02/20   Venia Carbon, MD  NONFORMULARY OR COMPOUNDED ITEM See pharmacy note 06/25/19   Felipa Furnace, DPM  pantoprazole (PROTONIX) 40 MG tablet TAKE 1 TABLET BY MOUTH ONCE A DAY 08/15/20   Viviana Simpler I, MD  simvastatin (ZOCOR) 40 MG tablet Take 40 mg by mouth every evening.    [provider]  tamsulosin (FLOMAX) 0.4 MG CAPS capsule TAKE 1 CAPSULE BY MOUTH ONCE DAILY 07/14/20   Venia Carbon, MD  traMADol (ULTRAM) 50 MG tablet 50 mg 2 (two) times daily as needed.  08/15/17   [provider]    Physical Exam: Vitals:   12/27/20 1830 12/27/20 1930 12/27/20 2100 12/27/20 2138  BP: 113/68 (!) 133/58 123/69   Pulse: (!) 110 (!) 101 64   Resp: (!) 23 19 (!) 29   Temp:    (!) 103.1 F (39.5 C)  TempSrc:    Rectal  SpO2: 99% 98% 92%   Weight:      Height:       Constitutional: Resting in bed with head elevated, NAD, calm Eyes: PERRL, lids and conjunctivae normal ENMT: Mucous membranes are dry. Posterior pharynx clear of any exudate or lesions.Normal dentition.  Neck: normal, supple, no masses. Respiratory: clear to auscultation bilaterally, no wheezing, no crackles. Normal  respiratory effort. No accessory muscle use.  Cardiovascular: Tachycardic, no murmurs / rubs / gallops. No extremity edema. 2+ pedal pulses. Abdomen: no tenderness, no masses palpated. Bowel sounds positive.  Musculoskeletal: no clubbing / cyanosis. No joint deformity upper and lower extremities. Good ROM, no contractures. Normal muscle tone.  Skin: Diaphoretic, no rashes, lesions, ulcers. No induration Neurologic: CN 2-12 grossly intact. Sensation intact. Strength 5/5 in all 4.  Psychiatric: Normal judgment and insight. Alert and oriented x 3. Normal mood.   Labs on Admission: I have personally reviewed following labs and imaging studies  CBC: Recent Labs  Lab 12/27/20 1752  WBC 15.3*  NEUTROABS 11.2*  HGB 12.9*  HCT 42.1  MCV 93.1  PLT 99991111   Basic Metabolic Panel: Recent Labs  Lab 12/27/20 1752  NA 139  K 4.1  CL 106  CO2 24  GLUCOSE 133*  BUN 22  CREATININE  1.48*  CALCIUM 8.7*   GFR: Estimated Creatinine Clearance: 32.9 mL/min (A) (by C-G formula based on SCr of 1.48 mg/dL (H)). Liver Function Tests: Recent Labs  Lab 12/27/20 1752  AST 31  ALT 19  ALKPHOS 66  BILITOT 0.7  PROT 7.6  ALBUMIN 4.0   Recent Labs  Lab 12/27/20 1752  LIPASE 22   No results for input(s): AMMONIA in the last 168 hours. Coagulation Profile: Recent Labs  Lab 12/27/20 1752  INR 1.3*   Cardiac Enzymes: No results for input(s): CKTOTAL, CKMB, CKMBINDEX, TROPONINI in the last 168 hours. BNP (last 3 results) No results for input(s): PROBNP in the last 8760 hours. HbA1C: No results for input(s): HGBA1C in the last 72 hours. CBG: No results for input(s): GLUCAP in the last 168 hours. Lipid Profile: No results for input(s): CHOL, HDL, LDLCALC, TRIG, CHOLHDL, LDLDIRECT in the last 72 hours. Thyroid Function Tests: No results for input(s): TSH, T4TOTAL, FREET4, T3FREE, THYROIDAB in the last 72 hours. Anemia Panel: No results for input(s): VITAMINB12, FOLATE, FERRITIN, TIBC, IRON,  RETICCTPCT in the last 72 hours. Urine analysis:    Component Value Date/Time   COLORURINE STRAW (A) 11/26/2020 2025   APPEARANCEUR CLEAR (A) 11/26/2020 2025   APPEARANCEUR Clear 04/29/2013 1543   LABSPEC 1.039 (H) 11/26/2020 2025   LABSPEC 1.020 04/29/2013 1543   PHURINE 6.0 11/26/2020 2025   GLUCOSEU 50 (A) 11/26/2020 2025   GLUCOSEU Negative 04/29/2013 1543   HGBUR MODERATE (A) 11/26/2020 2025   BILIRUBINUR NEGATIVE 11/26/2020 2025   BILIRUBINUR + 07/25/2018 1304   BILIRUBINUR Negative 04/29/2013 1543   KETONESUR 5 (A) 11/26/2020 2025   PROTEINUR 100 (A) 11/26/2020 2025   UROBILINOGEN 0.2 07/25/2018 1304   UROBILINOGEN 0.2 03/08/2012 1624   NITRITE NEGATIVE 11/26/2020 2025   LEUKOCYTESUR NEGATIVE 11/26/2020 2025   LEUKOCYTESUR Negative 04/29/2013 1543    Radiological Exams on Admission: CT Abdomen Pelvis Wo Contrast  Result Date: 12/27/2020 CLINICAL DATA:  Abdominal distention and fever. EXAM: CT ABDOMEN AND PELVIS WITHOUT CONTRAST TECHNIQUE: Multidetector CT imaging of the abdomen and pelvis was performed following the standard protocol without IV contrast. COMPARISON:  November 26, 2020 FINDINGS: Lower chest: No acute abnormality. Hepatobiliary: No focal liver abnormality is seen. The gallbladder is contracted and stable in appearance without gallstones or gallbladder wall thickening. Mild, stable, central intrahepatic biliary dilatation is noted. Pancreas: Unremarkable. No pancreatic ductal dilatation or surrounding inflammatory changes. Spleen: Normal in size without focal abnormality. Adrenals/Urinary Tract: Adrenal glands are unremarkable. Kidneys are normal in size, without renal calculi or hydronephrosis. A stable 2.9 cm diameter cyst is seen within the upper pole of the left kidney. Bladder is unremarkable. Stomach/Bowel: Stomach is within normal limits. The appendix is surgically absent. No evidence of bowel wall thickening, distention, or inflammatory changes.  Vascular/Lymphatic: Aortic atherosclerosis. No enlarged abdominal or pelvic lymph nodes. Reproductive: There is moderate to marked severity prostate gland enlargement. Other: A stable 2.7 cm x 1.6 cm fat containing left inguinal hernia is noted. No abdominopelvic ascites. Musculoskeletal: Grade 1 anterolisthesis of the L5 vertebral body is noted on S1 with marked severity degenerative changes again noted at the levels of L4-L5 and L5-S1. IMPRESSION: 1. Prostatomegaly. 2. Stable appearance of the gallbladder with mild, stable central intrahepatic biliary dilatation. 3. Extensive chronic and degenerative changes within the lower lumbar spine. Electronically Signed   By: Virgina Norfolk M.D.   On: 12/27/2020 19:20   DG Chest Port 1 View  Result Date: 12/27/2020 CLINICAL DATA:  Fever. EXAM: PORTABLE CHEST 1 VIEW COMPARISON:  November 27, 2020 FINDINGS: There is no evidence of an acute infiltrate, pleural effusion or pneumothorax. The heart size and mediastinal contours are within normal limits. The visualized skeletal structures are unremarkable. IMPRESSION: No acute cardiopulmonary disease. Electronically Signed   By: Virgina Norfolk M.D.   On: 12/27/2020 19:11    EKG: Personally reviewed. Tachycardia interpretation limited due to motion artifact, rate 120, PVC present, RBBB.  Rate is faster when compared to prior EKG which showed ectopic atrial rhythm.  Assessment/Plan Principal Problem:   Severe sepsis (HCC) Active Problems:   CAD (coronary artery disease)   Benign essential HTN   COPD (chronic obstructive pulmonary disease) (HCC)   Chronic renal disease, stage III (HCC)   BPH (benign prostatic hyperplasia)   Leon Estes is a 87 y.o. male with medical history significant for CAD s/p PCI/stenting to RCA 1997, COPD, CKD stage III, HTN, HLD, BPH who is admitted with severe sepsis.  Severe sepsis, POA: Patient presenting with fever, tachycardia, tachypnea, leukocytosis, lactic acidosis  consistent with severe sepsis.  No clear source of infection on time of admission.  Possibly intra-abdominal given recent cholecystitis.  CT abdomen/pelvis shows stable appearance of the gallbladder with mild stable central intrahepatic biliary dilatation. -Continue empiric IV vancomycin, cefepime, Flagyl -Follow blood cultures -Obtain urinalysis and urine culture -Continue IV fluid resuscitation overnight -Obtain RUQ abdominal ultrasound  Tachycardia: Secondary to sepsis.  On last admission there was question of atrial fibrillation without definitive evidence of A. fib on EKGs or telemetry.  He has been seen by cardiology who felt he had an ectopic atrial rhythm with PVCs therefore he was not started on anticoagulation. -Continue to monitor on telemetry -Continue Toprol-XL  CAD s/p PCI: Denies any chest pain.  Continue aspirin 81 mg daily, Toprol-XL, and simvastatin.  COPD: No wheezing on admission.  Continue Symbicort and as needed albuterol.  Supplemental O2 if needed.  CKD stage III: Chronic and stable.  Continue to monitor.  Hypertension: Continue Toprol-XL and lisinopril.  Hyperlipidemia: Continue simvastatin.  BPH: Continue tamsulosin.  DVT prophylaxis: Subcutaneous heparin Code Status: Full code, confirmed with patient Family Communication: Discussed with patient, he has discussed with family Disposition Plan: From home, dispo pending clinical progress Consults called: None Level of care: Stepdown Admission status:  Status is: Inpatient  Remains inpatient appropriate because:Ongoing diagnostic testing needed not appropriate for outpatient work up, IV treatments appropriate due to intensity of illness or inability to take PO and Inpatient level of care appropriate due to severity of illness   Dispo: The patient is from: Home              Anticipated d/c is to: Home              Patient currently is not medically stable to d/c.   Difficult to place patient  No  Zada Finders MD [facility]  If 7PM-7AM, please contact night-coverage [email or web address]  12/27/2020, 9:41 PM

## 2020-12-27 NOTE — Progress Notes (Signed)
A consult was received from an ED physician for vancomycin per pharmacy dosing.  The patient's profile has been reviewed for ht/wt/allergies/indication/available labs.   A one time order has been placed for vancomycin 1g.  Further antibiotics/pharmacy consults should be ordered by admitting physician if indicated.                       Thank you, Peggyann Juba, PharmD, BCPS 12/27/2020  6:24 PM

## 2020-12-28 DIAGNOSIS — N401 Enlarged prostate with lower urinary tract symptoms: Secondary | ICD-10-CM

## 2020-12-28 DIAGNOSIS — N1831 Chronic kidney disease, stage 3a: Secondary | ICD-10-CM | POA: Diagnosis not present

## 2020-12-28 DIAGNOSIS — H9190 Unspecified hearing loss, unspecified ear: Secondary | ICD-10-CM

## 2020-12-28 DIAGNOSIS — J449 Chronic obstructive pulmonary disease, unspecified: Secondary | ICD-10-CM

## 2020-12-28 DIAGNOSIS — I10 Essential (primary) hypertension: Secondary | ICD-10-CM | POA: Diagnosis not present

## 2020-12-28 DIAGNOSIS — E876 Hypokalemia: Secondary | ICD-10-CM

## 2020-12-28 DIAGNOSIS — K812 Acute cholecystitis with chronic cholecystitis: Secondary | ICD-10-CM

## 2020-12-28 DIAGNOSIS — A419 Sepsis, unspecified organism: Secondary | ICD-10-CM | POA: Diagnosis not present

## 2020-12-28 LAB — CBC
HCT: 34.7 % — ABNORMAL LOW (ref 39.0–52.0)
Hemoglobin: 10.5 g/dL — ABNORMAL LOW (ref 13.0–17.0)
MCH: 28.6 pg (ref 26.0–34.0)
MCHC: 30.3 g/dL (ref 30.0–36.0)
MCV: 94.6 fL (ref 80.0–100.0)
Platelets: 174 10*3/uL (ref 150–400)
RBC: 3.67 MIL/uL — ABNORMAL LOW (ref 4.22–5.81)
RDW: 14.4 % (ref 11.5–15.5)
WBC: 7.8 10*3/uL (ref 4.0–10.5)
nRBC: 0 % (ref 0.0–0.2)

## 2020-12-28 LAB — COMPREHENSIVE METABOLIC PANEL
ALT: 17 U/L (ref 0–44)
AST: 31 U/L (ref 15–41)
Albumin: 2.7 g/dL — ABNORMAL LOW (ref 3.5–5.0)
Alkaline Phosphatase: 45 U/L (ref 38–126)
Anion gap: 10 (ref 5–15)
BUN: 26 mg/dL — ABNORMAL HIGH (ref 8–23)
CO2: 19 mmol/L — ABNORMAL LOW (ref 22–32)
Calcium: 7.9 mg/dL — ABNORMAL LOW (ref 8.9–10.3)
Chloride: 110 mmol/L (ref 98–111)
Creatinine, Ser: 1.4 mg/dL — ABNORMAL HIGH (ref 0.61–1.24)
GFR, Estimated: 49 mL/min — ABNORMAL LOW (ref >60)
Glucose, Bld: 115 mg/dL — ABNORMAL HIGH (ref 70–99)
Potassium: 3.2 mmol/L — ABNORMAL LOW (ref 3.5–5.1)
Sodium: 139 mmol/L (ref 135–145)
Total Bilirubin: 0.5 mg/dL (ref 0.3–1.2)
Total Protein: 5.3 g/dL — ABNORMAL LOW (ref 6.5–8.1)

## 2020-12-28 LAB — MRSA PCR SCREENING: MRSA by PCR: POSITIVE — AB

## 2020-12-28 LAB — PROCALCITONIN: Procalcitonin: 29.44 ng/mL

## 2020-12-28 LAB — C DIFFICILE QUICK SCREEN W PCR REFLEX
C Diff antigen: POSITIVE — AB
C Diff interpretation: DETECTED
C Diff toxin: POSITIVE — AB

## 2020-12-28 MED ORDER — CHLORHEXIDINE GLUCONATE CLOTH 2 % EX PADS
6.0000 | MEDICATED_PAD | Freq: Once | CUTANEOUS | Status: AC
  Administered 2020-12-28: 6 via TOPICAL

## 2020-12-28 MED ORDER — BUPIVACAINE LIPOSOME 1.3 % IJ SUSP
20.0000 mL | Freq: Once | INTRAMUSCULAR | Status: DC
  Filled 2020-12-28: qty 20

## 2020-12-28 MED ORDER — GENTAMICIN SULFATE 40 MG/ML IJ SOLN
5.0000 mg/kg | INTRAVENOUS | Status: DC
  Filled 2020-12-28: qty 9

## 2020-12-28 MED ORDER — ENSURE PRE-SURGERY PO LIQD
296.0000 mL | Freq: Once | ORAL | Status: AC
  Administered 2020-12-28: 296 mL via ORAL
  Filled 2020-12-28: qty 296

## 2020-12-28 MED ORDER — ORAL CARE MOUTH RINSE
15.0000 mL | Freq: Two times a day (BID) | OROMUCOSAL | Status: DC
  Administered 2020-12-28 – 2021-01-23 (×52): 15 mL via OROMUCOSAL

## 2020-12-28 MED ORDER — MORPHINE SULFATE (PF) 2 MG/ML IV SOLN
2.0000 mg | INTRAVENOUS | Status: DC | PRN
  Administered 2020-12-28 – 2020-12-29 (×5): 2 mg via INTRAVENOUS
  Filled 2020-12-28 (×5): qty 1

## 2020-12-28 MED ORDER — METOPROLOL TARTRATE 5 MG/5ML IV SOLN
5.0000 mg | Freq: Once | INTRAVENOUS | Status: AC
  Administered 2020-12-28: 5 mg via INTRAVENOUS
  Filled 2020-12-28: qty 5

## 2020-12-28 MED ORDER — ACETAMINOPHEN 500 MG PO TABS: 1000.0000 mg | ORAL_TABLET | ORAL | Status: AC

## 2020-12-28 MED ORDER — POTASSIUM CHLORIDE CRYS ER 20 MEQ PO TBCR: 40.0000 meq | EXTENDED_RELEASE_TABLET | Freq: Once | ORAL | Status: DC

## 2020-12-28 MED ORDER — CHLORHEXIDINE GLUCONATE CLOTH 2 % EX PADS
6.0000 | MEDICATED_PAD | Freq: Every day | CUTANEOUS | Status: DC
  Administered 2020-12-29 – 2021-01-23 (×26): 6 via TOPICAL

## 2020-12-28 MED ORDER — CLINDAMYCIN PHOSPHATE 900 MG/50ML IV SOLN: 900.0000 mg | INTRAVENOUS | Status: DC

## 2020-12-28 MED ORDER — MAGNESIUM SULFATE 2 GM/50ML IV SOLN
2.0000 g | Freq: Once | INTRAVENOUS | Status: AC
  Administered 2020-12-28: 2 g via INTRAVENOUS
  Filled 2020-12-28: qty 50

## 2020-12-28 MED ORDER — POTASSIUM CHLORIDE IN NACL 40-0.9 MEQ/L-% IV SOLN
INTRAVENOUS | Status: DC
  Filled 2020-12-28 (×6): qty 1000

## 2020-12-28 MED ORDER — VANCOMYCIN 50 MG/ML ORAL SOLUTION
125.0000 mg | Freq: Four times a day (QID) | ORAL | Status: DC
  Administered 2020-12-28 – 2020-12-30 (×7): 125 mg via ORAL
  Filled 2020-12-28 (×9): qty 2.5

## 2020-12-28 MED ORDER — GABAPENTIN 300 MG PO CAPS: 300.0000 mg | ORAL_CAPSULE | ORAL | Status: AC

## 2020-12-28 MED ORDER — MUPIROCIN 2 % EX OINT
1.0000 "application " | TOPICAL_OINTMENT | Freq: Two times a day (BID) | CUTANEOUS | Status: AC
  Administered 2020-12-28 – 2021-01-01 (×10): 1 via NASAL
  Filled 2020-12-28 (×3): qty 22

## 2020-12-28 NOTE — ED Notes (Addendum)
Pt HR 120's, tachypenic, RR 30's. Increased WOB. Pt endorses worsening SOB. Afebrile at this time. MD made aware.

## 2020-12-28 NOTE — ED Notes (Signed)
Pt hR maintaining 150s' afib on the monitor. Denies any new onset of chest pain or increasing SOB. Denies hx of afib. MD made aware.

## 2020-12-28 NOTE — Progress Notes (Signed)
Consent for lap choly obtained from wife, Pike Scantlebury, over the phone. Witness by this RN and Azzie Almas, RN. Consent placed in chart.

## 2020-12-28 NOTE — Progress Notes (Signed)
Patient ID: Leon Estes, male   DOB: 07/07/1931, 87 y.o.   MRN: [id number]  PROGRESS NOTE    SYLVESTER Estes  XTG:[id number] DOB: 12-24-1930 DOA: 12/27/2020 PCP: Venia Carbon, MD   Brief Narrative:  87 year old male with history of CAD status post stenting to RCA in 1997, COPD, CKD stage III, hypertension, hyperlipidemia, BPH, recent admission at [facility] from 11/26/2020-11/29/2020 with presumed acute cholecystitis (RUQ ultrasound and CT abdomen/pelvis were equivocal.  HIDA scan was consistent with cystic duct obstruction) treated medically with antibiotics and he was followed up by general surgery as an outpatient with plans for laparoscopic cholecystectomy on 01/10/2021.  He presented on 12/27/2020 with fever and generalized weakness.  On presentation, temperature was 103.1 F rectally along with tachypnea and tachycardia, WBC of 15.3, creatinine of 1.48, normal LFTs, lactic acid of 2.8 and then 3.2 and BNP of 689.3.  COVID-19 and influenza tests were negative.  Chest x-ray was negative for focal consolidation. CT abdomen/pelvis without contrast showed prostatomegaly, stable appearance of the gallbladder with mild stable central intrahepatic biliary dilatation, and extensive chronic and degenerative changes within the lower lumbar spine.  He was started on IV fluids and antibiotics.  Assessment & Plan:   Severe sepsis, present on admission most likely secondary to infected gallbladder Leukocytosis -Patient was recently hospitalized at [facility] with presumed cholecystitis treated with antibiotics with outpatient follow-up with general surgery with plans for laparoscopic cholecystectomy on 01/10/2021. -Presented with temperature of 102.1 F rectally along with tachypnea, tachycardia, WBC of 15.3, elevated lactic acid.  No clear-cut source of infection with negative chest x-ray. COVID-19 and influenza tests were negative.  -Source possibly intra-abdominal given recent cholecystitis. CT abdomen/pelvis showed  stable appearance of the gallbladder with mild stable central intrahepatic biliary dilatation.  Right upper quadrant ultrasound shows cholelithiasis without acute cholecystitis -WBCs have normalized. -Continue IV fluids and broad-spectrum antibiotics.  Spoke with Dr. Olena Mater surgery who will probably take the patient to the OR today.  NPO.  CAD status post PCI -Currently stable.  No chest pain.  Continue aspirin, metoprolol, simvastatin.  Outpatient follow-up with cardiology  COPD Acute hypoxic respiratory failure -Currently stable.  Continue Symbicort and as needed albuterol -Currently on 3 L oxygen by nasal cannula.  Wean off as able.  Hypokalemia -Continue supplementation with IV fluids.  Repeat a.m. labs  Chronic kidney disease stage IIIa -Creatinine stable.  Monitor   Hypertension: -Continue Toprol-XL.  Blood pressure on the lower side.  Hold lisinopril.  Hyperlipidemia: -Continue simvastatin.  BPH: -Continue tamsulosin.    DVT prophylaxis: Subcutaneous heparin Code Status: Full Family Communication: None at bedside Disposition Plan: Status is: Inpatient  Remains inpatient appropriate because:Inpatient level of care appropriate due to severity of illness   Dispo: The patient is from: Home              Anticipated d/c is to: Home              Patient currently is not medically stable to d/c.   Difficult to place patient No  Consultants: General surgery  Procedures: None  Antimicrobials: Cefepime, vancomycin and Flagyl from 12/27/2020 onwards   Subjective: Patient seen and examined at bedside.  Poor historian.  Complains of some intermittent abdominal pain.  Currently denies any nausea.  Denies any chest pain or shortness of breath.  Objective: Vitals:   12/28/20 0645 12/28/20 0700 12/28/20 0900 12/28/20 0926  BP:  107/62  (!) 176/75  Pulse: (!) 109 91  (!) 117  Resp: Marland Kitchen)  30 (!) 30    Temp:  98.8 F (37.1 C) 98.5 F (36.9 C)   TempSrc:   Oral    SpO2: 93% 93%    Weight:      Height:        Intake/Output Summary (Last 24 hours) at 12/28/2020 0942 Last data filed at 12/28/2020 7588 Gross per 24 hour  Intake 876.88 ml  Output --  Net 876.88 ml   Filed Weights   12/27/20 1721  Weight: 71.2 kg    Examination:  General exam: Appears calm and comfortable.  Looks chronically ill.  On 3 L oxygen via nasal cannula.  Elderly male lying in bed. Respiratory system: Bilateral decreased breath sounds at bases with scattered crackles and intermittent tachypnea Cardiovascular system: S1 & S2 heard, intermittently tachycardic Gastrointestinal system: Abdomen is slightly distended, soft and mildly tender in the right upper quadrant region.  Normal bowel sounds heard. Extremities: No cyanosis, clubbing; trace lower extremity edema Central nervous system: Sleepy, wakes up slightly, very slow to respond, poor historian.  No focal neurological deficits. Moving extremities Skin: No rashes, lesions or ulcers Psychiatry: Does not participate in conversation much.  Flat affect    Data Reviewed: I have personally reviewed following labs and imaging studies  CBC: Recent Labs  Lab 12/27/20 1752 12/28/20 0514  WBC 15.3* 7.8  NEUTROABS 11.2*  --   HGB 12.9* 10.5*  HCT 42.1 34.7*  MCV 93.1 94.6  PLT 238 174   Basic Metabolic Panel: Recent Labs  Lab 12/27/20 1752 12/28/20 0514  NA 139 139  K 4.1 3.2*  CL 106 110  CO2 24 19*  GLUCOSE 133* 115*  BUN 22 26*  CREATININE 1.48* 1.40*  CALCIUM 8.7* 7.9*   GFR: Estimated Creatinine Clearance: 34.8 mL/min (A) (by C-G formula based on SCr of 1.4 mg/dL (H)). Liver Function Tests: Recent Labs  Lab 12/27/20 1752 12/28/20 0514  AST 31 31  ALT 19 17  ALKPHOS 66 45  BILITOT 0.7 0.5  PROT 7.6 5.3*  ALBUMIN 4.0 2.7*   Recent Labs  Lab 12/27/20 1752  LIPASE 22   No results for input(s): AMMONIA in the last 168 hours. Coagulation Profile: Recent Labs  Lab 12/27/20 1752  INR 1.3*    Cardiac Enzymes: No results for input(s): CKTOTAL, CKMB, CKMBINDEX, TROPONINI in the last 168 hours. BNP (last 3 results) No results for input(s): PROBNP in the last 8760 hours. HbA1C: No results for input(s): HGBA1C in the last 72 hours. CBG: No results for input(s): GLUCAP in the last 168 hours. Lipid Profile: No results for input(s): CHOL, HDL, LDLCALC, TRIG, CHOLHDL, LDLDIRECT in the last 72 hours. Thyroid Function Tests: No results for input(s): TSH, T4TOTAL, FREET4, T3FREE, THYROIDAB in the last 72 hours. Anemia Panel: No results for input(s): VITAMINB12, FOLATE, FERRITIN, TIBC, IRON, RETICCTPCT in the last 72 hours. Sepsis Labs: Recent Labs  Lab 12/27/20 1752 12/27/20 1952 12/27/20 2132 12/28/20 0514  PROCALCITON  --   --   --  29.44  LATICACIDVEN 3.2* 2.8* 2.5*  --     Recent Results (from the past 240 hour(s))  Blood Culture (routine x 2)     Status: None (Preliminary result)   Collection Time: 12/27/20  5:52 PM   Specimen: BLOOD  Result Value Ref Range Status   Specimen Description   Final    BLOOD SITE NOT SPECIFIED Performed at [facility], [address] [place], [place] [address]    Special Requests  Final    BOTTLES DRAWN AEROBIC AND ANAEROBIC Blood Culture adequate volume Performed at [facility] [address] [place], [place] [address]    Culture   Final    NO GROWTH < 12 HOURS Performed at [facility] [address], [place], [place] [address]    Report Status PENDING  Incomplete  Blood Culture (routine x 2)     Status: None (Preliminary result)   Collection Time: 12/27/20  6:19 PM   Specimen: BLOOD  Result Value Ref Range Status   Specimen Description   Final    BLOOD LEFT ANTECUBITAL Performed at [facility] [address], [place], [place] [address]    Special Requests   Final    BOTTLES DRAWN AEROBIC AND ANAEROBIC Blood Culture results may not be optimal due to an inadequate  volume of blood received in culture bottles Performed at [facility] [address], [place], [place]  [address]    Culture   Final    NO GROWTH < 12 HOURS Performed at [facility] [address], [place], [place] [address]    Report Status PENDING  Incomplete  Resp Panel by RT-PCR (Flu A&B, Covid) Nasopharyngeal Swab     Status: None   Collection Time: 12/27/20  6:21 PM   Specimen: Nasopharyngeal Swab; Nasopharyngeal(NP) swabs in vial transport medium  Result Value Ref Range Status   SARS Coronavirus 2 by RT PCR NEGATIVE NEGATIVE Final    Comment: (NOTE) SARS-CoV-2 target nucleic acids are NOT DETECTED.  The SARS-CoV-2 RNA is generally detectable in upper respiratory specimens during the acute phase of infection. The lowest concentration of SARS-CoV-2 viral copies this assay can detect is 138 copies/mL. A negative result does not preclude SARS-Cov-2 infection and should not be used as the sole basis for treatment or other patient management decisions. A negative result may occur with  improper specimen collection/handling, submission of specimen other than nasopharyngeal swab, presence of viral mutation(s) within the areas targeted by this assay, and inadequate number of viral copies(<138 copies/mL). A negative result must be combined with clinical observations, patient history, and epidemiological information. The expected result is Negative.  Fact Sheet for Patients:  EntrepreneurPulse.com.au  Fact Sheet for Healthcare Providers:  IncredibleEmployment.be  This test is no t yet approved or cleared by the [place] FDA and  has been authorized for detection and/or diagnosis of SARS-CoV-2 by FDA under an Emergency Use Authorization (EUA). This EUA will remain  in effect (meaning this test can be used) for the duration of the COVID-19 declaration under Section 564(b)(1) of the Act, 21 U.S.C.section  360bbb-3(b)(1), unless the authorization is terminated  or revoked sooner.       Influenza A by PCR NEGATIVE NEGATIVE Final   Influenza B by PCR NEGATIVE NEGATIVE Final    Comment: (NOTE) The Xpert Xpress SARS-CoV-2/FLU/RSV plus assay is intended as an aid in the diagnosis of influenza from Nasopharyngeal swab specimens and should not be used as a sole basis for treatment. Nasal washings and aspirates are unacceptable for Xpert Xpress SARS-CoV-2/FLU/RSV testing.  Fact Sheet for Patients: EntrepreneurPulse.com.au  Fact Sheet for Healthcare Providers: IncredibleEmployment.be  This test is not yet approved or cleared by the [place] FDA and has been authorized for detection and/or diagnosis of SARS-CoV-2 by FDA under an Emergency Use Authorization (EUA). This EUA will remain in effect (meaning this test can be used) for the duration of the COVID-19 declaration under Section  564(b)(1) of the Act, 21 U.S.C. section 360bbb-3(b)(1), unless the authorization is terminated or revoked.  Performed at [facility], [place] [address] [place], [place] [address]          Radiology Studies: CT Abdomen Pelvis Wo Contrast  Result Date: 12/27/2020 CLINICAL DATA:  Abdominal distention and fever. EXAM: CT ABDOMEN AND PELVIS WITHOUT CONTRAST TECHNIQUE: Multidetector CT imaging of the abdomen and pelvis was performed following the standard protocol without IV contrast. COMPARISON:  November 26, 2020 FINDINGS: Lower chest: No acute abnormality. Hepatobiliary: No focal liver abnormality is seen. The gallbladder is contracted and stable in appearance without gallstones or gallbladder wall thickening. Mild, stable, central intrahepatic biliary dilatation is noted. Pancreas: Unremarkable. No pancreatic ductal dilatation or surrounding inflammatory changes. Spleen: Normal in size without focal abnormality. Adrenals/Urinary Tract: Adrenal glands are  unremarkable. Kidneys are normal in size, without renal calculi or hydronephrosis. A stable 2.9 cm diameter cyst is seen within the upper pole of the left kidney. Bladder is unremarkable. Stomach/Bowel: Stomach is within normal limits. The appendix is surgically absent. No evidence of bowel wall thickening, distention, or inflammatory changes. Vascular/Lymphatic: Aortic atherosclerosis. No enlarged abdominal or pelvic lymph nodes. Reproductive: There is moderate to marked severity prostate gland enlargement. Other: A stable 2.7 cm x 1.6 cm fat containing left inguinal hernia is noted. No abdominopelvic ascites. Musculoskeletal: Grade 1 anterolisthesis of the L5 vertebral body is noted on S1 with marked severity degenerative changes again noted at the levels of L4-L5 and L5-S1. IMPRESSION: 1. Prostatomegaly. 2. Stable appearance of the gallbladder with mild, stable central intrahepatic biliary dilatation. 3. Extensive chronic and degenerative changes within the lower lumbar spine. Electronically Signed   By: Virgina Norfolk M.D.   On: 12/27/2020 19:20   DG Chest Port 1 View  Result Date: 12/27/2020 CLINICAL DATA:  Fever. EXAM: PORTABLE CHEST 1 VIEW COMPARISON:  November 27, 2020 FINDINGS: There is no evidence of an acute infiltrate, pleural effusion or pneumothorax. The heart size and mediastinal contours are within normal limits. The visualized skeletal structures are unremarkable. IMPRESSION: No acute cardiopulmonary disease. Electronically Signed   By: Virgina Norfolk M.D.   On: 12/27/2020 19:11   US Abdomen Limited RUQ (LIVER/GB)  Result Date: 12/27/2020 CLINICAL DATA:  Severe sepsis. EXAM: ULTRASOUND ABDOMEN LIMITED RIGHT UPPER QUADRANT COMPARISON:  November 26, 2020 FINDINGS: Gallbladder: Shadowing echogenic gallstones are seen within the lumen of a contracted gallbladder. The largest measures approximately 1.0 cm. There is no evidence of gallbladder wall thickening (2.4 mm). No sonographic Murphy sign  noted by sonographer. Common bile duct: Diameter: 6.9 mm Liver: No focal lesion identified. Within normal limits in parenchymal echogenicity. Portal vein is patent on color Doppler imaging with normal direction of blood flow towards the liver. Other: None. IMPRESSION: Cholelithiasis without evidence of acute cholecystitis. Electronically Signed   By: Virgina Norfolk M.D.   On: 12/27/2020 22:46        Scheduled Meds: . acetaminophen  1,000 mg Oral On Call to OR  . aspirin EC  81 mg Oral Daily  . bupivacaine liposome  20 mL Infiltration Once  . Chlorhexidine Gluconate Cloth  6 each Topical Once  . gabapentin  300 mg Oral On Call to OR  . heparin  5,000 Units Subcutaneous Q8H  . mouth rinse  15 mL Mouth Rinse BID  . metoprolol succinate  25 mg Oral Daily  . mometasone-formoterol  2 puff Inhalation BID  . pantoprazole  40 mg Oral Daily  .  potassium chloride  40 mEq Oral Once  . simvastatin  40 mg Oral QPM  . sodium chloride flush  3 mL Intravenous Q12H  . tamsulosin  0.4 mg Oral Daily   Continuous Infusions: . 0.9 % NaCl with KCl 40 mEq / L 100 mL/hr at 12/28/20 0937  . ceFEPime (MAXIPIME) IV 2 g (12/28/20 0515)  . clindamycin (CLEOCIN) IV     And  . gentamicin    . metronidazole Stopped (12/28/20 0304)  . [START ON 12/29/2020] vancomycin            Aline August, MD [facility] 12/28/2020, 9:42 AM

## 2020-12-28 NOTE — Progress Notes (Signed)
Initial Nutrition Assessment  DOCUMENTATION CODES:   Not applicable  INTERVENTION:  - diet advancement as medically feasible. - complete NFPE at follow-up.   NUTRITION DIAGNOSIS:   Increased nutrient needs related to acute illness,post-op healing as evidenced by estimated needs.  GOAL:   Patient will meet greater than or equal to 90% of their needs  MONITOR:   Diet advancement,Labs,Weight trends  REASON FOR ASSESSMENT:   Malnutrition Screening Tool  ASSESSMENT:   87 year old male with medical history of CAD s/p stenting in 1997, COPD, stage 3 CKD, HTN, HLD, and BPH. He was admitted to [facility] 4/16-4/19 for cholecystitis, cystic duct obstruction with plan for lap chole on 5/31. He presented to the ED on 5/17 with fever and generalized weakness. CT abdomen/pelvis showed prostatomegaly.  He has been NPO since admission.  Patient on the bedpan at time of visit earlier this afternoon. His wife and daughter were at bedside and provide all information.  Patient does not have any teeth and does not have dentures so he requires soft, easy to chew foods. He has no issues with swallowing. A favorite for him is creamed potatoes and cubed steak made in the crockpot.   For the past 1 month he has had a poor appetite, in part d/t pain related to cholecystitis. Plan was for lap chole on 5/31 but he developed a fever and was transferred to the hospital.  He last ate on Monday (5/16) evening when he had meat loaf and a tomato sandwich.   His UBW is 155-160 lb and he was last weighed at a doctor's appt on 5/12 at which time he weighed 157 lb.   Weight yesterday was documented as 157 lb. Weight has been stable or the past 2.5 months. Weight on 09/13/20 was 164 lb. This indicates 7 lb weight loss (4.3% body weight) in the past 3.5 months; not significant for time frame.    Labs reviewed; K: 3.2 mmol/l, BUN: 26 mg/dl, creatinine: 1.4 mg/dl, Ca: 7.9 mg/dl, GFR: 49 ml/min.  Medications reviewed; 2  g IV Mg sulfate x1 run 5/18, 40 mg oral protonix/day, 40 mEq Klor-Con x1 dose 5/18. IVF; NS @ 100 ml/hr.     NUTRITION - FOCUSED PHYSICAL EXAM:  unable to complete for patient on bedpan  Diet Order:   Diet Order            Diet NPO time specified Except for: Ice Chips, Sips with Meds  Diet effective now                 EDUCATION NEEDS:   Not appropriate for education at this time  Skin:  Skin Assessment: Reviewed RN Assessment  Last BM:  5/18 (type 7 x2)  Height:   Ht Readings from Last 1 Encounters:  12/27/20 5\' 7"  (1.702 m)    Weight:   Wt Readings from Last 1 Encounters:  12/27/20 71.2 kg     Estimated Nutritional Needs:  Kcal:  1600-1800 kcal Protein:  70-85 grams Fluid:  >/= 1.8 L/day     Jarome Matin, MS, RD, LDN, CNSC Inpatient Clinical Dietitian RD pager # available in [facility]  After hours/weekend pager # available in [facility]

## 2020-12-28 NOTE — Telephone Encounter (Signed)
Spoke to pt's wife. She said she called EMS at [place] yesterday. Has been admitted to [facility]. Scheduled to have gallbladder surgery tomorrow.

## 2020-12-28 NOTE — Progress Notes (Signed)
Date and time results received: 12/28/2020 1800  Test: C diff  Critical Value: Positive result   Name of Provider Notified: Dr. Remi Haggard   Orders Received? Or Actions Taken?: Awaiting orders

## 2020-12-28 NOTE — Progress Notes (Signed)
Request to IR for possible percutaneous cholecystostomy - patient imaging reviewed by IR attending, Dr. Annamaria Boots, who does notes a very small and contracted gallbladder on most recent imaging which does not appear to require acute drainage for source control. No plans for IR procedure. This was discussed with Barkley Boards, PA-C today.  Will delete order for percutaneous cholecystostomy.   Please call IR with questions or concerns.  Candiss Norse, PA-C

## 2020-12-28 NOTE — Progress Notes (Signed)
Progress Note     Subjective: Patient was admitted to [facility] 4/16-4/19 with abdominal pain and found to have acute cholecystitis on HIDA, but reportedly refused surgery at that time and was discharged on PO antibiotics. Patient seen by Dr. Redmond Pulling 5/12 for consideration of elective cholecystectomy - He was scheduled for lap chole 5/27 but referred to the ED with fever of 103 yesterday. Readmitted with sepsis. This AM patient reports mild abdominal pain and diarrhea for the last 2 days.  PMH significant for CAD status post stenting to RCA in 1997, COPD, CKD stage III, hypertension, hyperlipidemia, BPH. Past abdominal surgery includes appendectomy. Patient is not on any blood thinning medications at home.   Review of Systems  Constitutional: Positive for chills, fever and malaise/fatigue.  Respiratory: Negative for shortness of breath and wheezing.   Cardiovascular: Negative for chest pain and palpitations.  Gastrointestinal: Positive for abdominal pain and diarrhea. Negative for blood in stool, constipation, melena, nausea and vomiting.  Genitourinary: Negative for dysuria, frequency and urgency.  All other systems reviewed and are negative.    Objective: Vital signs in last 24 hours: Temp:  [98 F (36.7 C)-103.1 F (39.5 C)] 98.8 F (37.1 C) (05/18 0700) Pulse Rate:  [64-158] 91 (05/18 0700) Resp:  [16-35] 30 (05/18 0700) BP: (107-157)/(56-104) 107/62 (05/18 0700) SpO2:  [89 %-99 %] 93 % (05/18 0700) Weight:  [71.2 kg] 71.2 kg (05/17 1721)    Intake/Output from previous day: No intake/output data recorded. Intake/Output this shift: Total I/O In: 873.9 [I.V.:873.9] Out: -   PE: General: pleasant, WD, chronically ill appearing male who is laying in bed with rigors HEENT: head is normocephalic, atraumatic.  Sclera are anicteric.   Heart: tachycardic and regular.  Normal s1,s2. No obvious murmurs, gallops, or rubs noted.  Palpable radial and pedal pulses bilaterally Lungs: CTAB,  no wheezes, rhonchi, or rales noted. Tachypnea, on 3 L via [place] Abd: soft, TTP in RUQ, distended, +BS, no masses, hernias, or organomegaly MS: all 4 extremities are symmetrical with no cyanosis, clubbing, or edema. Skin: warm and dry with no masses, lesions, or rashes Neuro: Cranial nerves 2-12 grossly intact, sensation is normal throughout Psych: A&Ox3 with an appropriate affect.    Lab Results:  Recent Labs    12/27/20 1752 12/28/20 0514  WBC 15.3* 7.8  HGB 12.9* 10.5*  HCT 42.1 34.7*  PLT 238 174   BMET Recent Labs    12/27/20 1752 12/28/20 0514  NA 139 139  K 4.1 3.2*  CL 106 110  CO2 24 19*  GLUCOSE 133* 115*  BUN 22 26*  CREATININE 1.48* 1.40*  CALCIUM 8.7* 7.9*   PT/INR Recent Labs    12/27/20 1752  LABPROT 16.3*  INR 1.3*   CMP     Component Value Date/Time   NA 139 12/28/2020 0514   NA 135 (L) 04/29/2013 1543   K 3.2 (L) 12/28/2020 0514   K 3.3 (L) 04/29/2013 1543   CL 110 12/28/2020 0514   CL 102 04/29/2013 1543   CO2 19 (L) 12/28/2020 0514   CO2 27 04/29/2013 1543   GLUCOSE 115 (H) 12/28/2020 0514   GLUCOSE 120 (H) 04/29/2013 1543   BUN 26 (H) 12/28/2020 0514   BUN 17 04/29/2013 1543   CREATININE 1.40 (H) 12/28/2020 0514   CREATININE 2.47 (H) 09/09/2020 1343   CALCIUM 7.9 (L) 12/28/2020 0514   CALCIUM 9.8 04/29/2013 1543   PROT 5.3 (L) 12/28/2020 0514   PROT 7.9 04/29/2013 1543  ALBUMIN 2.7 (L) 12/28/2020 0514   ALBUMIN 3.8 04/29/2013 1543   AST 31 12/28/2020 0514   AST 30 04/29/2013 1543   ALT 17 12/28/2020 0514   ALT 24 04/29/2013 1543   ALKPHOS 45 12/28/2020 0514   ALKPHOS 79 04/29/2013 1543   BILITOT 0.5 12/28/2020 0514   BILITOT 0.4 04/29/2013 1543   GFRNONAA 49 (L) 12/28/2020 0514   GFRNONAA >60 04/29/2013 1543   GFRAA 42 (L) 05/04/2017 1945   GFRAA >60 04/29/2013 1543   Lipase     Component Value Date/Time   LIPASE 22 12/27/2020 1752   LIPASE 154 04/29/2013 1543       Studies/Results: CT Abdomen Pelvis Wo  Contrast  Result Date: 12/27/2020 CLINICAL DATA:  Abdominal distention and fever. EXAM: CT ABDOMEN AND PELVIS WITHOUT CONTRAST TECHNIQUE: Multidetector CT imaging of the abdomen and pelvis was performed following the standard protocol without IV contrast. COMPARISON:  November 26, 2020 FINDINGS: Lower chest: No acute abnormality. Hepatobiliary: No focal liver abnormality is seen. The gallbladder is contracted and stable in appearance without gallstones or gallbladder wall thickening. Mild, stable, central intrahepatic biliary dilatation is noted. Pancreas: Unremarkable. No pancreatic ductal dilatation or surrounding inflammatory changes. Spleen: Normal in size without focal abnormality. Adrenals/Urinary Tract: Adrenal glands are unremarkable. Kidneys are normal in size, without renal calculi or hydronephrosis. A stable 2.9 cm diameter cyst is seen within the upper pole of the left kidney. Bladder is unremarkable. Stomach/Bowel: Stomach is within normal limits. The appendix is surgically absent. No evidence of bowel wall thickening, distention, or inflammatory changes. Vascular/Lymphatic: Aortic atherosclerosis. No enlarged abdominal or pelvic lymph nodes. Reproductive: There is moderate to marked severity prostate gland enlargement. Other: A stable 2.7 cm x 1.6 cm fat containing left inguinal hernia is noted. No abdominopelvic ascites. Musculoskeletal: Grade 1 anterolisthesis of the L5 vertebral body is noted on S1 with marked severity degenerative changes again noted at the levels of L4-L5 and L5-S1. IMPRESSION: 1. Prostatomegaly. 2. Stable appearance of the gallbladder with mild, stable central intrahepatic biliary dilatation. 3. Extensive chronic and degenerative changes within the lower lumbar spine. Electronically Signed   By: Virgina Norfolk M.D.   On: 12/27/2020 19:20   DG Chest Port 1 View  Result Date: 12/27/2020 CLINICAL DATA:  Fever. EXAM: PORTABLE CHEST 1 VIEW COMPARISON:  November 27, 2020 FINDINGS:  There is no evidence of an acute infiltrate, pleural effusion or pneumothorax. The heart size and mediastinal contours are within normal limits. The visualized skeletal structures are unremarkable. IMPRESSION: No acute cardiopulmonary disease. Electronically Signed   By: Virgina Norfolk M.D.   On: 12/27/2020 19:11   US Abdomen Limited RUQ (LIVER/GB)  Result Date: 12/27/2020 CLINICAL DATA:  Severe sepsis. EXAM: ULTRASOUND ABDOMEN LIMITED RIGHT UPPER QUADRANT COMPARISON:  November 26, 2020 FINDINGS: Gallbladder: Shadowing echogenic gallstones are seen within the lumen of a contracted gallbladder. The largest measures approximately 1.0 cm. There is no evidence of gallbladder wall thickening (2.4 mm). No sonographic Murphy sign noted by sonographer. Common bile duct: Diameter: 6.9 mm Liver: No focal lesion identified. Within normal limits in parenchymal echogenicity. Portal vein is patent on color Doppler imaging with normal direction of blood flow towards the liver. Other: None. IMPRESSION: Cholelithiasis without evidence of acute cholecystitis. Electronically Signed   By: Virgina Norfolk M.D.   On: 12/27/2020 22:46    Anti-infectives: Anti-infectives (From admission, onward)   Start     Dose/Rate Route Frequency Ordered Stop   12/29/20 0600  vancomycin (VANCOREADY) IVPB 1250  mg/250 mL        1,250 mg 166.7 mL/hr over 90 Minutes Intravenous Every 36 hours 12/27/20 2315     12/28/20 0830  clindamycin (CLEOCIN) IVPB 900 mg       "And" Linked Group Details   900 mg 100 mL/hr over 30 Minutes Intravenous On call to O.R. 12/28/20 5053 12/29/20 0559   12/28/20 0830  gentamicin (GARAMYCIN) 360 mg in dextrose 5 % 100 mL IVPB       "And" Linked Group Details   5 mg/kg  71.2 kg 109 mL/hr over 60 Minutes Intravenous On call to O.R. 12/28/20 9767 12/29/20 0559   12/28/20 0600  ceFEPIme (MAXIPIME) 2 g in sodium chloride 0.9 % 100 mL IVPB        2 g 200 mL/hr over 30 Minutes Intravenous Every 12 hours  12/27/20 2315     12/28/20 0200  metroNIDAZOLE (FLAGYL) IVPB 500 mg        500 mg 100 mL/hr over 60 Minutes Intravenous Every 8 hours 12/27/20 2202     12/27/20 1830  aztreonam (AZACTAM) 2 g in sodium chloride 0.9 % 100 mL IVPB  Status:  Discontinued        2 g 200 mL/hr over 30 Minutes Intravenous  Once 12/27/20 1820 12/27/20 1823   12/27/20 1830  metroNIDAZOLE (FLAGYL) IVPB 500 mg        500 mg 100 mL/hr over 60 Minutes Intravenous  Once 12/27/20 1820 12/27/20 1935   12/27/20 1830  vancomycin (VANCOCIN) IVPB 1000 mg/200 mL premix        1,000 mg 200 mL/hr over 60 Minutes Intravenous  Once 12/27/20 1820 12/27/20 1934   12/27/20 1830  ceFEPIme (MAXIPIME) 2 g in sodium chloride 0.9 % 100 mL IVPB        2 g 200 mL/hr over 30 Minutes Intravenous  Once 12/27/20 1824 12/27/20 1903       Assessment/Plan CAD status post stenting to RCA in 1997 COPD CKD stage III - Cr 1.4 today  Hypertension Hyperlipidemia BPH  Cholelithiasis Probable acute on chronic cholecystitis  - [place] and CT yesterday equivocal for acute cholecystitis, HIDA 4/18 significant for cholecystitis  - patient readmitted yesterday with sepsis with no other etiology identified - recommend proceeding with laparoscopic cholecystectomy as OR schedule allows - if acutely worsening or unable to proceed to OR today, may need to consider percutaneous cholecystostomy  - we will continue to follow closely   FEN: NPO, IVF VTE: SQ heparin  ID: vanc 5/17; Cefepime/flagyl 5/17>>, gent/clinda 5/18    LOS: 1 day    Norm Parcel, [facility] 12/28/2020, 9:02 AM Please see Amion for pager number during day hours 7:00am-4:30pm

## 2020-12-28 NOTE — Telephone Encounter (Signed)
Please check to see how he is. If he still has fever and any significant abdominal symptoms, she should likely bring him back to the ER

## 2020-12-28 NOTE — ED Notes (Signed)
Called 2W to check on bed assignment

## 2020-12-29 ENCOUNTER — Encounter (HOSPITAL_COMMUNITY)
Admission: EM | Disposition: A | Discharge status: Home Health | Payer: Self-pay | Source: Home / Self Care | Attending: Internal Medicine

## 2020-12-29 DIAGNOSIS — K812 Acute cholecystitis with chronic cholecystitis: Secondary | ICD-10-CM

## 2020-12-29 DIAGNOSIS — A0472 Enterocolitis due to Clostridium difficile, not specified as recurrent: Secondary | ICD-10-CM

## 2020-12-29 DIAGNOSIS — A419 Sepsis, unspecified organism: Secondary | ICD-10-CM | POA: Diagnosis not present

## 2020-12-29 DIAGNOSIS — I10 Essential (primary) hypertension: Secondary | ICD-10-CM | POA: Diagnosis not present

## 2020-12-29 LAB — COMPREHENSIVE METABOLIC PANEL
ALT: 22 U/L (ref 0–44)
AST: 50 U/L — ABNORMAL HIGH (ref 15–41)
Albumin: 2.8 g/dL — ABNORMAL LOW (ref 3.5–5.0)
Alkaline Phosphatase: 43 U/L (ref 38–126)
Anion gap: 6 (ref 5–15)
BUN: 37 mg/dL — ABNORMAL HIGH (ref 8–23)
CO2: 19 mmol/L — ABNORMAL LOW (ref 22–32)
Calcium: 7.8 mg/dL — ABNORMAL LOW (ref 8.9–10.3)
Chloride: 116 mmol/L — ABNORMAL HIGH (ref 98–111)
Creatinine, Ser: 1.34 mg/dL — ABNORMAL HIGH (ref 0.61–1.24)
GFR, Estimated: 51 mL/min — ABNORMAL LOW (ref >60)
Glucose, Bld: 130 mg/dL — ABNORMAL HIGH (ref 70–99)
Potassium: 4.1 mmol/L (ref 3.5–5.1)
Sodium: 141 mmol/L (ref 135–145)
Total Bilirubin: 0.4 mg/dL (ref 0.3–1.2)
Total Protein: 5.6 g/dL — ABNORMAL LOW (ref 6.5–8.1)

## 2020-12-29 LAB — GASTROINTESTINAL PANEL BY PCR, STOOL (REPLACES STOOL CULTURE)

## 2020-12-29 LAB — MAGNESIUM: Magnesium: 2.1 mg/dL (ref 1.7–2.4)

## 2020-12-29 LAB — CBC WITH DIFFERENTIAL/PLATELET
Abs Immature Granulocytes: 0 10*3/uL (ref 0.00–0.07)
Band Neutrophils: 6 %
Basophils Absolute: 0 10*3/uL (ref 0.0–0.1)
Basophils Relative: 0 %
Eosinophils Absolute: 0 10*3/uL (ref 0.0–0.5)
Eosinophils Relative: 0 %
HCT: 36 % — ABNORMAL LOW (ref 39.0–52.0)
Hemoglobin: 11.1 g/dL — ABNORMAL LOW (ref 13.0–17.0)
Lymphocytes Relative: 7 %
Lymphs Abs: 0.5 10*3/uL — ABNORMAL LOW (ref 0.7–4.0)
MCH: 29 pg (ref 26.0–34.0)
MCHC: 30.8 g/dL (ref 30.0–36.0)
MCV: 94 fL (ref 80.0–100.0)
Monocytes Absolute: 0.3 10*3/uL (ref 0.1–1.0)
Monocytes Relative: 4 %
Neutro Abs: 6.7 10*3/uL (ref 1.7–7.7)
Neutrophils Relative %: 83 %
Platelets: 164 10*3/uL (ref 150–400)
RBC: 3.83 MIL/uL — ABNORMAL LOW (ref 4.22–5.81)
RDW: 14.6 % (ref 11.5–15.5)
WBC: 7.5 10*3/uL (ref 4.0–10.5)
nRBC: 0 % (ref 0.0–0.2)

## 2020-12-29 LAB — URINE CULTURE

## 2020-12-29 LAB — PREALBUMIN: Prealbumin: 11.7 mg/dL — ABNORMAL LOW (ref 18–38)

## 2020-12-29 SURGERY — LAPAROSCOPIC CHOLECYSTECTOMY WITH INTRAOPERATIVE CHOLANGIOGRAM
Anesthesia: General

## 2020-12-29 MED ORDER — METOPROLOL TARTRATE 5 MG/5ML IV SOLN
2.5000 mg | Freq: Four times a day (QID) | INTRAVENOUS | Status: DC | PRN
  Administered 2020-12-30 – 2021-01-02 (×3): 2.5 mg via INTRAVENOUS
  Filled 2020-12-29 (×3): qty 5

## 2020-12-29 MED ORDER — METHOCARBAMOL 500 MG PO TABS: 500.0000 mg | ORAL_TABLET | Freq: Three times a day (TID) | ORAL | Status: DC | PRN

## 2020-12-29 MED ORDER — HYDROMORPHONE HCL 2 MG PO TABS: 2.0000 mg | ORAL_TABLET | Freq: Four times a day (QID) | ORAL | Status: DC | PRN

## 2020-12-29 NOTE — TOC Initial Note (Signed)
Transition of Care [facility]) - Initial/Assessment Note    Patient Details  Name: Leon Estes MRN: [id number] Date of Birth: [birth year]  [facility] CM/SW Contact:    Leeroy Cha, RN Phone Number: 12/29/2020, 7:42 AM  Clinical Narrative:                 87 y.o. male with medical history significant for CAD s/p PCI/stenting to RCA 1997, COPD, CKD stage III, HTN, HLD, BPH who presents to the ED for evaluation of fever and weakness.  Patient was recently admitted to [facility] 11/26/2020-11/29/2020 for presumed acute cholecystitis.  RUQ ultrasound and CT abdomen/pelvis were equivocal.  HIDA scan was consistent with cystic duct obstruction.  Surgical intervention was deferred.  Patient was treated with antibiotics.  He was followed up with general surgery as an outpatient with plans for laparoscopic cholecystectomy on 01/10/2021.  Patient states when he woke up this morning he just felt generally weak.  He had fevers and diaphoresis.  He did not have any chest pain, dyspnea, cough, abdominal pain, dysuria.  He does report some recent diarrhea.  He has had nausea without emesis.  ED Course:  Initial vitals showed BP 142/88, pulse 96, RR 16, temp 100.0 F, SPO2 90% on room air.  T-max while in the ED was 103.1 F rectally.  Patient has been tachypneic with respiratory rate up to the 30s and tachycardic with pulse up to 130s.  Labs show WBC 15.3, hemoglobin 12.9, platelets 238,000, sodium 139, potassium 4.1, bicarb 24, BUN 22, creatinine 1.48, serum glucose 133.  LFTs are within normal limits.  Lactic acid 3.2 > 2.8.  BNP 689.3.  Blood cultures obtained and pending.  UA and urine culture ordered and pending collection.  SARS-CoV-2 PCR negative.  Influenza A/B-.  Portable chest x-ray is negative for focal consolidation, edema, effusion.  CT abdomen/pelvis without contrast shows prostatomegaly, stable appearance of the gallbladder with mild stable central intrahepatic biliary  dilatation, and extensive chronic and degenerative changes within the lower lumbar spine.  Patient was given empiric IV vancomycin, cefepime, Flagyl.  He was given 2.25 L LR followed by continuous maintenance fluids.  The hospitalist service was consulted to admit for further evaluation and management. PLAN: to rturn to home with self care and supervision of the wife.  Expected Discharge Plan: Home/Self Care Barriers to Discharge: Continued Medical Work up   Patient Goals and CMS Choice Patient states their goals for this hospitalization and ongoing recovery are:: not stated CMS Medicare.gov Compare Post Acute Care list provided to:: Patient    Expected Discharge Plan and Services Expected Discharge Plan: Home/Self Care   Discharge Planning Services: CM Consult   Living arrangements for the past 2 months: Single Family Home                                      Prior Living Arrangements/Services Living arrangements for the past 2 months: Single Family Home Lives with:: Spouse Patient language and need for interpreter reviewed:: Yes Do you feel safe going back to the place where you live?: Yes      Need for Family Participation in Patient Care: No (Comment) Care giver support system in place?: No (comment)   Criminal Activity/Legal Involvement Pertinent to Current Situation/Hospitalization: No - Comment as needed  Activities of Daily Living Home Assistive Devices/Equipment: Other (Comment) (pt unable to answer) ADL Screening (condition at time  of admission) Patient's cognitive ability adequate to safely complete daily activities?: No Is the patient deaf or have difficulty hearing?: Yes Does the patient have difficulty seeing, even when wearing glasses/contacts?: Yes (hx macular degeneration in right eye per computer) Does the patient have difficulty concentrating, remembering, or making decisions?: Yes Patient able to express need for assistance with ADLs?: No Does  the patient have difficulty dressing or bathing?: Yes Independently performs ADLs?: No Communication: Dependent Is this a change from baseline?: Change from baseline, expected to last >3 days Dressing (OT): Needs assistance Is this a change from baseline?: Pre-admission baseline Grooming: Needs assistance Is this a change from baseline?: Pre-admission baseline Feeding: Needs assistance Is this a change from baseline?: Change from baseline, expected to last >3 days Bathing: Needs assistance Is this a change from baseline?: Pre-admission baseline Toileting: Needs assistance Is this a change from baseline?: Pre-admission baseline In/Out Bed: Needs assistance Is this a change from baseline?: Pre-admission baseline Walks in Home: Dependent Is this a change from baseline?: Change from baseline, expected to last >3 days Does the patient have difficulty walking or climbing stairs?: Yes Weakness of Legs: Both Weakness of Arms/Hands: Both  Permission Sought/Granted                  Emotional Assessment Appearance:: Appears stated age Attitude/Demeanor/Rapport: Engaged Affect (typically observed): Calm Orientation: : Oriented to Place,Oriented to Self,Oriented to  Time,Oriented to Situation Alcohol / Substance Use: Not Applicable Psych Involvement: No (comment)  Admission diagnosis:  Severe sepsis ([place]) [A41.9, R65.20] Fever, unspecified fever cause [R50.9] Patient Active Problem List   Diagnosis Date Noted  . HOH (hard of hearing) 12/28/2020  . Acute on chronic cholecystitis 12/28/2020  . Hypokalemia 12/28/2020  . Severe sepsis ([place]) 12/27/2020  . Arrhythmia 12/06/2020  . Pulmonary nodule 12/06/2020  . Acute cholecystitis 11/26/2020  . Preventative health care 10/12/2020  . Macular degeneration, wet ([place]) 10/12/2020  . Acquired claw toe, left 02/02/2020  . BPH (benign prostatic hyperplasia) 01/13/2020  . Neuropathy 02/18/2018  . COPD (chronic obstructive pulmonary disease)  ([place])   . Chronic renal disease, stage III ([place])   . Benign essential HTN 04/09/2017  . CAD (coronary artery disease) 03/03/2015  . Hyperlipidemia 03/03/2015   PCP:  Venia Carbon, MD Pharmacy:   [facility], No Name [address], [place] [address] Phone: [phone] Fax: [phone]  [place], [place] [address] [place] [address] Phone: [phone] Fax: [phone]     Social Determinants of Health (SDOH) Interventions    Readmission Risk Interventions No flowsheet data found.

## 2020-12-29 NOTE — Plan of Care (Signed)

## 2020-12-29 NOTE — Telephone Encounter (Signed)
Yes---I saw that he had been admitted for sepsis and likely the gallbladder acting up again

## 2020-12-29 NOTE — Progress Notes (Signed)
Progress Note  Day of Surgery  Subjective: Patient reports abdomen feels sore. He has still had some diarrhea. He denies nausea this AM. Abdomen is bloated. He is alert and oriented to self and time and somewhat to place/situation.   Objective: Vital signs in last 24 hours: Temp:  [98.1 F (36.7 C)-98.4 F (36.9 C)] 98.3 F (36.8 C) (05/19 0800) Pulse Rate:  [44-123] 112 (05/19 0800) Resp:  [13-26] 16 (05/19 0800) BP: (115-176)/(42-111) 140/80 (05/19 0800) SpO2:  [93 %-100 %] 97 % (05/19 0800) Weight:  [76.1 kg] 76.1 kg (05/19 0336) Last BM Date: 12/29/20  Intake/Output from previous day: 05/18 0701 - 05/19 0700 In: 2734.5 [I.V.:2034.5; IV Piggyback:700] Out: 200 [Urine:200] Intake/Output this shift: Total I/O In: 996.1 [I.V.:952; IV Piggyback:44.1] Out: -   PE: General: pleasant, WD, chronically ill appearing male who is laying in bed in NAD HEENT: head is normocephalic, atraumatic.  Sclera are anicteric.   Heart: tachycardic and regular.  Normal s1,s2. No obvious murmurs, gallops, or rubs noted.  Palpable radial and pedal pulses bilaterally Lungs: CTAB, no wheezes, rhonchi, or rales noted. Tachypnea, on 3 L via [place] Abd: soft, mild generalized ttp without peritonitis, +BS, no masses, hernias, or organomegaly MS: all 4 extremities are symmetrical with no cyanosis, clubbing, or edema. Skin: warm and dry with no masses, lesions, or rashes Neuro: Cranial nerves 2-12 grossly intact, sensation is normal throughout Psych: A&Ox3 with an appropriate affect.    Lab Results:  Recent Labs    12/28/20 0514 12/29/20 0256  WBC 7.8 7.5  HGB 10.5* 11.1*  HCT 34.7* 36.0*  PLT 174 164   BMET Recent Labs    12/28/20 0514 12/29/20 0256  NA 139 141  K 3.2* 4.1  CL 110 116*  CO2 19* 19*  GLUCOSE 115* 130*  BUN 26* 37*  CREATININE 1.40* 1.34*  CALCIUM 7.9* 7.8*   PT/INR Recent Labs    12/27/20 1752  LABPROT 16.3*  INR 1.3*   CMP     Component Value Date/Time   NA  141 12/29/2020 0256   NA 135 (L) 04/29/2013 1543   K 4.1 12/29/2020 0256   K 3.3 (L) 04/29/2013 1543   CL 116 (H) 12/29/2020 0256   CL 102 04/29/2013 1543   CO2 19 (L) 12/29/2020 0256   CO2 27 04/29/2013 1543   GLUCOSE 130 (H) 12/29/2020 0256   GLUCOSE 120 (H) 04/29/2013 1543   BUN 37 (H) 12/29/2020 0256   BUN 17 04/29/2013 1543   CREATININE 1.34 (H) 12/29/2020 0256   CREATININE 2.47 (H) 09/09/2020 1343   CALCIUM 7.8 (L) 12/29/2020 0256   CALCIUM 9.8 04/29/2013 1543   PROT 5.6 (L) 12/29/2020 0256   PROT 7.9 04/29/2013 1543   ALBUMIN 2.8 (L) 12/29/2020 0256   ALBUMIN 3.8 04/29/2013 1543   AST 50 (H) 12/29/2020 0256   AST 30 04/29/2013 1543   ALT 22 12/29/2020 0256   ALT 24 04/29/2013 1543   ALKPHOS 43 12/29/2020 0256   ALKPHOS 79 04/29/2013 1543   BILITOT 0.4 12/29/2020 0256   BILITOT 0.4 04/29/2013 1543   GFRNONAA 51 (L) 12/29/2020 0256   GFRNONAA >60 04/29/2013 1543   GFRAA 42 (L) 05/04/2017 1945   GFRAA >60 04/29/2013 1543   Lipase     Component Value Date/Time   LIPASE 22 12/27/2020 1752   LIPASE 154 04/29/2013 1543       Studies/Results: CT Abdomen Pelvis Wo Contrast  Result Date: 12/27/2020 CLINICAL DATA:  Abdominal distention  and fever. EXAM: CT ABDOMEN AND PELVIS WITHOUT CONTRAST TECHNIQUE: Multidetector CT imaging of the abdomen and pelvis was performed following the standard protocol without IV contrast. COMPARISON:  November 26, 2020 FINDINGS: Lower chest: No acute abnormality. Hepatobiliary: No focal liver abnormality is seen. The gallbladder is contracted and stable in appearance without gallstones or gallbladder wall thickening. Mild, stable, central intrahepatic biliary dilatation is noted. Pancreas: Unremarkable. No pancreatic ductal dilatation or surrounding inflammatory changes. Spleen: Normal in size without focal abnormality. Adrenals/Urinary Tract: Adrenal glands are unremarkable. Kidneys are normal in size, without renal calculi or hydronephrosis. A  stable 2.9 cm diameter cyst is seen within the upper pole of the left kidney. Bladder is unremarkable. Stomach/Bowel: Stomach is within normal limits. The appendix is surgically absent. No evidence of bowel wall thickening, distention, or inflammatory changes. Vascular/Lymphatic: Aortic atherosclerosis. No enlarged abdominal or pelvic lymph nodes. Reproductive: There is moderate to marked severity prostate gland enlargement. Other: A stable 2.7 cm x 1.6 cm fat containing left inguinal hernia is noted. No abdominopelvic ascites. Musculoskeletal: Grade 1 anterolisthesis of the L5 vertebral body is noted on S1 with marked severity degenerative changes again noted at the levels of L4-L5 and L5-S1. IMPRESSION: 1. Prostatomegaly. 2. Stable appearance of the gallbladder with mild, stable central intrahepatic biliary dilatation. 3. Extensive chronic and degenerative changes within the lower lumbar spine. Electronically Signed   By: Virgina Norfolk M.D.   On: 12/27/2020 19:20   DG Chest Port 1 View  Result Date: 12/27/2020 CLINICAL DATA:  Fever. EXAM: PORTABLE CHEST 1 VIEW COMPARISON:  November 27, 2020 FINDINGS: There is no evidence of an acute infiltrate, pleural effusion or pneumothorax. The heart size and mediastinal contours are within normal limits. The visualized skeletal structures are unremarkable. IMPRESSION: No acute cardiopulmonary disease. Electronically Signed   By: Virgina Norfolk M.D.   On: 12/27/2020 19:11   US Abdomen Limited RUQ (LIVER/GB)  Result Date: 12/27/2020 CLINICAL DATA:  Severe sepsis. EXAM: ULTRASOUND ABDOMEN LIMITED RIGHT UPPER QUADRANT COMPARISON:  November 26, 2020 FINDINGS: Gallbladder: Shadowing echogenic gallstones are seen within the lumen of a contracted gallbladder. The largest measures approximately 1.0 cm. There is no evidence of gallbladder wall thickening (2.4 mm). No sonographic Murphy sign noted by sonographer. Common bile duct: Diameter: 6.9 mm Liver: No focal lesion  identified. Within normal limits in parenchymal echogenicity. Portal vein is patent on color Doppler imaging with normal direction of blood flow towards the liver. Other: None. IMPRESSION: Cholelithiasis without evidence of acute cholecystitis. Electronically Signed   By: Virgina Norfolk M.D.   On: 12/27/2020 22:46    Anti-infectives: Anti-infectives (From admission, onward)   Start     Dose/Rate Route Frequency Ordered Stop   12/29/20 0600  vancomycin (VANCOREADY) IVPB 1250 mg/250 mL  Status:  Discontinued        1,250 mg 166.7 mL/hr over 90 Minutes Intravenous Every 36 hours 12/27/20 2315 12/28/20 1007   12/28/20 2000  vancomycin (VANCOCIN) 50 mg/mL oral solution 125 mg        125 mg Oral Every 6 hours 12/28/20 1801     12/28/20 0830  clindamycin (CLEOCIN) IVPB 900 mg       "And" Linked Group Details   900 mg 100 mL/hr over 30 Minutes Intravenous On call to O.R. 12/28/20 9983 12/29/20 0559   12/28/20 0830  gentamicin (GARAMYCIN) 360 mg in dextrose 5 % 100 mL IVPB       "And" Linked Group Details   5 mg/kg  71.2  kg 109 mL/hr over 60 Minutes Intravenous On call to O.R. 12/28/20 3710 12/29/20 0559   12/28/20 0600  ceFEPIme (MAXIPIME) 2 g in sodium chloride 0.9 % 100 mL IVPB        2 g 200 mL/hr over 30 Minutes Intravenous Every 12 hours 12/27/20 2315     12/28/20 0200  metroNIDAZOLE (FLAGYL) IVPB 500 mg        500 mg 100 mL/hr over 60 Minutes Intravenous Every 8 hours 12/27/20 2202     12/27/20 1830  aztreonam (AZACTAM) 2 g in sodium chloride 0.9 % 100 mL IVPB  Status:  Discontinued        2 g 200 mL/hr over 30 Minutes Intravenous  Once 12/27/20 1820 12/27/20 1823   12/27/20 1830  metroNIDAZOLE (FLAGYL) IVPB 500 mg        500 mg 100 mL/hr over 60 Minutes Intravenous  Once 12/27/20 1820 12/27/20 1935   12/27/20 1830  vancomycin (VANCOCIN) IVPB 1000 mg/200 mL premix        1,000 mg 200 mL/hr over 60 Minutes Intravenous  Once 12/27/20 1820 12/27/20 1934   12/27/20 1830  ceFEPIme  (MAXIPIME) 2 g in sodium chloride 0.9 % 100 mL IVPB        2 g 200 mL/hr over 30 Minutes Intravenous  Once 12/27/20 1824 12/27/20 1903       Assessment/Plan CAD status post stenting to RCA in 1997 COPD CKD stage III - Cr 1.34  Hypertension Hyperlipidemia BPH  Cholelithiasis Chronic cholecystitis  Sepsis C. Diff colitis  - [place] and CT yesterday equivocal for acute cholecystitis, HIDA 4/18 significant for cholecystitis  - patient readmitted 5/17 with sepsis - discussed with IR as well and no window for percutaneous drainage of gallbladder, but imaging also stable from over the last several months - patient was having diarrhea yesterday, C. Diff positive - at this time, I do not believe gallbladder is the source of patient's sepsis. I would recommend treating C.Diff and then consider elective cholecystectomy once better from that. No surgery planned acutely  - would recommend ice chips only at this time given abdominal distention  - check prealbumin   FEN: NPO with ice chips, IVF VTE: SQ heparin  ID: vanc 5/17; Cefepime/flagyl 5/17>5/19, gent/clinda 5/18; PO VANC 5/19>>  LOS: 2 days    Norm Parcel, [facility] 12/29/2020, 9:15 AM Please see Amion for pager number during day hours 7:00am-4:30pm

## 2020-12-29 NOTE — Progress Notes (Signed)
Patient ID: Leon Estes, male   DOB: [birth year], 87 y.o.   MRN: [id number]  PROGRESS NOTE    Leon Estes  DUK:[id number] DOB: April 02, 1931 DOA: 12/27/2020 PCP: Venia Carbon, MD   Brief Narrative:  87 year old male with history of CAD status post stenting to RCA in 1997, COPD, CKD stage III, hypertension, hyperlipidemia, BPH, recent admission at [facility] from 11/26/2020-11/29/2020 with presumed acute cholecystitis (RUQ ultrasound and CT abdomen/pelvis were equivocal.  HIDA scan was consistent with cystic duct obstruction) treated medically with antibiotics and he was followed up by general surgery as an outpatient with plans for laparoscopic cholecystectomy on 01/10/2021.  He presented on 12/27/2020 with fever and generalized weakness.  On presentation, temperature was 103.1 F rectally along with tachypnea and tachycardia, WBC of 15.3, creatinine of 1.48, normal LFTs, lactic acid of 2.8 and then 3.2 and BNP of 689.3.  COVID-19 and influenza tests were negative.  Chest x-ray was negative for focal consolidation. CT abdomen/pelvis without contrast showed prostatomegaly, stable appearance of the gallbladder with mild stable central intrahepatic biliary dilatation, and extensive chronic and degenerative changes within the lower lumbar spine.  He was started on IV fluids and antibiotics.  General surgery was consulted.  Assessment & Plan:   Severe sepsis, present on admission  [name] Leukocytosis: Resolved ?  Acute on chronic cholecystitis -Patient was recently hospitalized at [facility] with presumed cholecystitis treated with antibiotics with outpatient follow-up with general surgery with plans for laparoscopic cholecystectomy on 01/10/2021. -Presented with temperature of 102.1 F rectally along with tachypnea, tachycardia, WBC of 15.3, elevated lactic acid.  No clear-cut source of infection with negative chest x-ray. COVID-19 and influenza tests were negative.  - CT abdomen/pelvis showed  stable appearance of the gallbladder with mild stable central intrahepatic biliary dilatation.  Right upper quadrant ultrasound shows cholelithiasis without acute cholecystitis -WBCs have normalized. -General surgery following and initially were planning for surgical intervention on 12/28/2020 but surgery was postponed because of diarrhea and stool was tested.  Stool is positive for C. difficile.  Patient has been started on oral vancomycin.  After communicating with general surgery, will DC cefepime and Flagyl. -Continue IV fluids.  Might advance diet if okay with general surgery.  CAD status post PCI -Currently stable.  No chest pain.  Continue aspirin, metoprolol, simvastatin.  Outpatient follow-up with cardiology  COPD Acute hypoxic respiratory failure -Currently stable.  Continue Symbicort and as needed albuterol -Currently still on 3 L oxygen by nasal cannula.  Wean off as able.  Hypokalemia -Improved.  Currently on IV fluids with supplemental potassium.  Chronic kidney disease stage IIIa -Creatinine stable.  Monitor  Hypertension: -Continue Toprol-XL.  Blood pressure improving.  Lisinopril on hold.  Hyperlipidemia: -Continue simvastatin.  BPH: -Continue tamsulosin.    DVT prophylaxis: Subcutaneous heparin Code Status: Full Family Communication: None at bedside Disposition Plan: Status is: Inpatient  Remains inpatient appropriate because:Inpatient level of care appropriate due to severity of illness   Dispo: The patient is from: Home              Anticipated d/c is to: Home              Patient currently is not medically stable to d/c.   Difficult to place patient No  Consultants: General surgery  Procedures: None  Antimicrobials: Cefepime, vancomycin and Flagyl from 12/27/2020 onwards   Subjective: Patient seen and examined at bedside.  Poor historian.  No overnight fever or vomiting reported.  Still complains of  intermittent abdominal pain with  nausea. Objective: Vitals:   12/29/20 0336 12/29/20 0400 12/29/20 0500 12/29/20 0700  BP:  (!) 145/53 132/69 (!) 155/111  Pulse:  (!) 55 (!) 108 (!) 44  Resp:  14 15 20   Temp:      TempSrc:      SpO2:  98% 100% 93%  Weight: 76.1 kg     Height:        Intake/Output Summary (Last 24 hours) at 12/29/2020 0729 Last data filed at 12/29/2020 0721 Gross per 24 hour  Intake 3730.59 ml  Output 200 ml  Net 3530.59 ml   Filed Weights   12/27/20 1721 12/29/20 0336  Weight: 71.2 kg 76.1 kg    Examination:  General exam: No acute distress.  Looks chronically ill.  Still on 3 L oxygen by nasal cannula.  Elderly male lying in bed. Respiratory system: Decreased bilateral breath sounds at bases with scattered crackles  cardiovascular system: Intermittently tachycardic and bradycardic; S1-S2 heard Gastrointestinal system: Abdomen is distended mildly, soft and mildly tender diffusely.  Bowel sounds are heard Extremities: Mild lower extremity edema present; no clubbing Central nervous system: Still very slow to respond to questions.  No focal neurological deficits.  Moves extremities Skin: No obvious ecchymosis/lesions Psychiatry: Affect is flat.    Data Reviewed: I have personally reviewed following labs and imaging studies  CBC: Recent Labs  Lab 12/27/20 1752 12/28/20 0514 12/29/20 0256  WBC 15.3* 7.8 7.5  NEUTROABS 11.2*  --  6.7  HGB 12.9* 10.5* 11.1*  HCT 42.1 34.7* 36.0*  MCV 93.1 94.6 94.0  PLT 238 174 [id number]   Basic Metabolic Panel: Recent Labs  Lab 12/27/20 1752 12/28/20 0514 12/29/20 0256  NA 139 139 141  K 4.1 3.2* 4.1  CL 106 110 116*  CO2 24 19* 19*  GLUCOSE 133* 115* 130*  BUN 22 26* 37*  CREATININE 1.48* 1.40* 1.34*  CALCIUM 8.7* 7.9* 7.8*  MG  --   --  2.1   GFR: Estimated Creatinine Clearance: 36.3 mL/min (A) (by C-G formula based on SCr of 1.34 mg/dL (H)). Liver Function Tests: Recent Labs  Lab 12/27/20 1752 12/28/20 0514 12/29/20 0256  AST 31 31  50*  ALT 19 17 22   ALKPHOS 66 45 43  BILITOT 0.7 0.5 0.4  PROT 7.6 5.3* 5.6*  ALBUMIN 4.0 2.7* 2.8*   Recent Labs  Lab 12/27/20 1752  LIPASE 22   No results for input(s): AMMONIA in the last 168 hours. Coagulation Profile: Recent Labs  Lab 12/27/20 1752  INR 1.3*   Cardiac Enzymes: No results for input(s): CKTOTAL, CKMB, CKMBINDEX, TROPONINI in the last 168 hours. BNP (last 3 results) No results for input(s): PROBNP in the last 8760 hours. HbA1C: No results for input(s): HGBA1C in the last 72 hours. CBG: No results for input(s): GLUCAP in the last 168 hours. Lipid Profile: No results for input(s): CHOL, HDL, LDLCALC, TRIG, CHOLHDL, LDLDIRECT in the last 72 hours. Thyroid Function Tests: No results for input(s): TSH, T4TOTAL, FREET4, T3FREE, THYROIDAB in the last 72 hours. Anemia Panel: No results for input(s): VITAMINB12, FOLATE, FERRITIN, TIBC, IRON, RETICCTPCT in the last 72 hours. Sepsis Labs: Recent Labs  Lab 12/27/20 1752 12/27/20 1952 12/27/20 2132 12/28/20 0514  PROCALCITON  --   --   --  29.44  LATICACIDVEN 3.2* 2.8* 2.5*  --     Recent Results (from the past 240 hour(s))  Blood Culture (routine x 2)     Status: None (Preliminary  result)   Collection Time: 12/27/20  5:52 PM   Specimen: BLOOD  Result Value Ref Range Status   Specimen Description   Final    BLOOD SITE NOT SPECIFIED Performed at [facility], [address], [place], [place] [address]    Special Requests   Final    BOTTLES DRAWN AEROBIC AND ANAEROBIC Blood Culture adequate volume Performed at [facility] [address] [place], [place] [address]    Culture   Final    NO GROWTH < 12 HOURS Performed at [facility] [address], [place], [place] [address]    Report Status PENDING  Incomplete  Blood Culture (routine x 2)     Status: None (Preliminary result)   Collection Time: 12/27/20  6:19 PM   Specimen: BLOOD  Result Value Ref Range Status    Specimen Description   Final    BLOOD LEFT ANTECUBITAL Performed at [facility] [address] [place], [place] [address]    Special Requests   Final    BOTTLES DRAWN AEROBIC AND ANAEROBIC Blood Culture results may not be optimal due to an inadequate volume of blood received in culture bottles Performed at [facility] [address], [place], [place] [address]    Culture   Final    NO GROWTH < 12 HOURS Performed at [facility] [address], [place],  [address]    Report Status PENDING  Incomplete  Resp Panel by RT-PCR (Flu A&B, Covid) Nasopharyngeal Swab     Status: None   Collection Time: 12/27/20  6:21 PM   Specimen: Nasopharyngeal Swab; Nasopharyngeal(NP) swabs in vial transport medium  Result Value Ref Range Status   SARS Coronavirus 2 by RT PCR NEGATIVE NEGATIVE Final    Comment: (NOTE) SARS-CoV-2 target nucleic acids are NOT DETECTED.  The SARS-CoV-2 RNA is generally detectable in upper respiratory specimens during the acute phase of infection. The lowest concentration of SARS-CoV-2 viral copies this assay can detect is 138 copies/mL. A negative result does not preclude SARS-Cov-2 infection and should not be used as the sole basis for treatment or other patient management decisions. A negative result may occur with  improper specimen collection/handling, submission of specimen other than nasopharyngeal swab, presence of viral mutation(s) within the areas targeted by this assay, and inadequate number of viral copies(<138 copies/mL). A negative result must be combined with clinical observations, patient history, and epidemiological information. The expected result is Negative.  Fact Sheet for Patients:  EntrepreneurPulse.com.au  Fact Sheet for Healthcare Providers:  IncredibleEmployment.be  This test is no t yet approved or cleared by the [place] FDA and  has been  authorized for detection and/or diagnosis of SARS-CoV-2 by FDA under an Emergency Use Authorization (EUA). This EUA will remain  in effect (meaning this test can be used) for the duration of the COVID-19 declaration under Section 564(b)(1) of the Act, 21 U.S.C.section 360bbb-3(b)(1), unless the authorization is terminated  or revoked sooner.       Influenza A by PCR NEGATIVE NEGATIVE Final   Influenza B by PCR NEGATIVE NEGATIVE Final    Comment: (NOTE) The Xpert Xpress SARS-CoV-2/FLU/RSV plus assay is intended as an aid in the diagnosis of influenza from Nasopharyngeal swab specimens and should not be used as a sole basis for treatment. Nasal washings and aspirates are unacceptable for Xpert Xpress SARS-CoV-2/FLU/RSV testing.  Fact Sheet for Patients: EntrepreneurPulse.com.au  Fact Sheet for Healthcare Providers: IncredibleEmployment.be  This test is not yet approved or cleared by the [place] and has been authorized for detection and/or diagnosis of SARS-CoV-2 by FDA under an Emergency Use Authorization (EUA). This EUA will remain in effect (meaning this test can be used) for the duration of the COVID-19 declaration under Section 564(b)(1) of the Act, 21 U.S.C. section 360bbb-3(b)(1), unless the authorization is terminated or revoked.  Performed at [facility], [place] [address], [place], [place] [address]   MRSA PCR Screening     Status: Abnormal   Collection Time: 12/28/20  8:46 AM   Specimen: Nasal Mucosa; Nasopharyngeal  Result Value Ref Range Status   MRSA by PCR POSITIVE (A) NEGATIVE Final    Comment:        The GeneXpert MRSA Assay (FDA approved for NASAL specimens only), is one component of a comprehensive MRSA colonization surveillance program. It is not intended to diagnose MRSA infection nor to guide or monitor treatment for MRSA infections. RESULT CALLED TO, READ BACK BY AND VERIFIED  WITH: Leida Lauth RN AT 1112 12/28/20 MULLINS,T Performed at [facility], [place] [address], [place], [place] [address]   C Difficile Quick Screen w PCR reflex     Status: Abnormal   Collection Time: 12/28/20 11:43 AM   Specimen: STOOL  Result Value Ref Range Status   C Diff antigen POSITIVE (A) NEGATIVE Final   C Diff toxin POSITIVE (A) NEGATIVE Final   C Diff interpretation Toxin producing C. difficile detected.  Final    Comment: CRITICAL RESULT CALLED TO, READ BACK BY AND VERIFIED WITH: L.CAUDLE, RN AT 1757 ON 05.18.22 BY N.THOMPSON Performed at [facility] [address] [place], [place] [address]          Radiology Studies: CT Abdomen Pelvis Wo Contrast  Result Date: 12/27/2020 CLINICAL DATA:  Abdominal distention and fever. EXAM: CT ABDOMEN AND PELVIS WITHOUT CONTRAST TECHNIQUE: Multidetector CT imaging of the abdomen and pelvis was performed following the standard protocol without IV contrast. COMPARISON:  November 26, 2020 FINDINGS: Lower chest: No acute abnormality. Hepatobiliary: No focal liver abnormality is seen. The gallbladder is contracted and stable in appearance without gallstones or gallbladder wall thickening. Mild, stable, central intrahepatic biliary dilatation is noted. Pancreas: Unremarkable. No pancreatic ductal dilatation or surrounding inflammatory changes. Spleen: Normal in size without focal abnormality. Adrenals/Urinary Tract: Adrenal glands are unremarkable. Kidneys are normal in size, without renal calculi or hydronephrosis. A stable 2.9 cm diameter cyst is seen within the upper pole of the left kidney. Bladder is unremarkable. Stomach/Bowel: Stomach is within normal limits. The appendix is surgically absent. No evidence of bowel wall thickening, distention, or inflammatory changes. Vascular/Lymphatic: Aortic atherosclerosis. No enlarged abdominal or pelvic lymph nodes. Reproductive: There is moderate to marked severity  prostate gland enlargement. Other: A stable 2.7 cm x 1.6 cm fat containing left inguinal hernia is noted. No abdominopelvic ascites. Musculoskeletal: Grade 1 anterolisthesis of the L5 vertebral body is noted on S1 with marked severity degenerative changes again noted at the levels of L4-L5 and L5-S1. IMPRESSION: 1. Prostatomegaly. 2. Stable appearance of the gallbladder with mild, stable central intrahepatic biliary dilatation. 3. Extensive chronic and degenerative changes within the lower lumbar spine. Electronically Signed   By: Virgina Norfolk M.D.   On: 12/27/2020 19:20   DG Chest Port 1 View  Result Date: 12/27/2020 CLINICAL DATA:  Fever. EXAM: PORTABLE CHEST 1 VIEW COMPARISON:  November 27, 2020 FINDINGS: There is no evidence of an acute  infiltrate, pleural effusion or pneumothorax. The heart size and mediastinal contours are within normal limits. The visualized skeletal structures are unremarkable. IMPRESSION: No acute cardiopulmonary disease. Electronically Signed   By: Virgina Norfolk M.D.   On: 12/27/2020 19:11   US Abdomen Limited RUQ (LIVER/GB)  Result Date: 12/27/2020 CLINICAL DATA:  Severe sepsis. EXAM: ULTRASOUND ABDOMEN LIMITED RIGHT UPPER QUADRANT COMPARISON:  November 26, 2020 FINDINGS: Gallbladder: Shadowing echogenic gallstones are seen within the lumen of a contracted gallbladder. The largest measures approximately 1.0 cm. There is no evidence of gallbladder wall thickening (2.4 mm). No sonographic Murphy sign noted by sonographer. Common bile duct: Diameter: 6.9 mm Liver: No focal lesion identified. Within normal limits in parenchymal echogenicity. Portal vein is patent on color Doppler imaging with normal direction of blood flow towards the liver. Other: None. IMPRESSION: Cholelithiasis without evidence of acute cholecystitis. Electronically Signed   By: Virgina Norfolk M.D.   On: 12/27/2020 22:46        Scheduled Meds: . aspirin EC  81 mg Oral Daily  . bupivacaine liposome   20 mL Infiltration Once  . Chlorhexidine Gluconate Cloth  6 each Topical Daily  . heparin  5,000 Units Subcutaneous Q8H  . mouth rinse  15 mL Mouth Rinse BID  . metoprolol succinate  25 mg Oral Daily  . mometasone-formoterol  2 puff Inhalation BID  . mupirocin ointment  1 application Nasal BID  . pantoprazole  40 mg Oral Daily  . potassium chloride  40 mEq Oral Once  . simvastatin  40 mg Oral QPM  . sodium chloride flush  3 mL Intravenous Q12H  . tamsulosin  0.4 mg Oral Daily  . vancomycin  125 mg Oral Q6H   Continuous Infusions: . 0.9 % NaCl with KCl 40 mEq / L 100 mL/hr at 12/29/20 0721  . ceFEPime (MAXIPIME) IV Stopped (12/29/20 ED:[id number])  . metronidazole Stopped (12/29/20 0400)          Aline August, MD [facility] 12/29/2020, 7:29 AM

## 2020-12-29 NOTE — Progress Notes (Signed)
Patient ID: Leon Estes, male   DOB: September 15, 1930, 87 y.o.   MRN: [id number]  PROGRESS NOTE    Leon Estes  [id number] DOB: 1930-10-06 DOA: 12/27/2020 PCP: Venia Carbon, MD   Brief Narrative:  87 year old male with history of CAD status post stenting to RCA in 1997, COPD, CKD stage III, hypertension, hyperlipidemia, BPH, recent admission at [facility] from 11/26/2020-11/29/2020 with presumed acute cholecystitis (RUQ ultrasound and CT abdomen/pelvis were equivocal.  HIDA scan was consistent with cystic duct obstruction) treated medically with antibiotics and he was followed up by general surgery as an outpatient with plans for laparoscopic cholecystectomy on 01/10/2021.  He presented on 12/27/2020 with fever and generalized weakness.  On presentation, temperature was 103.1 F rectally along with tachypnea and tachycardia, WBC of 15.3, creatinine of 1.48, normal LFTs, lactic acid of 2.8 and then 3.2 and BNP of 689.3.  COVID-19 and influenza tests were negative.  Chest x-ray was negative for focal consolidation. CT abdomen/pelvis without contrast showed prostatomegaly, stable appearance of the gallbladder with mild stable central intrahepatic biliary dilatation, and extensive chronic and degenerative changes within the lower lumbar spine.  He was started on IV fluids and antibiotics.  General surgery was consulted.  Assessment & Plan:   Severe sepsis, present on admission  [name] Leukocytosis: Resolved ?  Acute on chronic cholecystitis -Patient was recently hospitalized at [facility] with presumed cholecystitis treated with antibiotics with outpatient follow-up with general surgery with plans for laparoscopic cholecystectomy on 01/10/2021. -Presented with temperature of 102.1 F rectally along with tachypnea, tachycardia, WBC of 15.3, elevated lactic acid.  No clear-cut source of infection with negative chest x-ray. COVID-19 and influenza tests were negative.  - CT abdomen/pelvis showed  stable appearance of the gallbladder with mild stable central intrahepatic biliary dilatation.  Right upper quadrant ultrasound shows cholelithiasis without acute cholecystitis -WBCs have normalized. -General surgery following and initially were planning for surgical intervention on 12/28/2020 but surgery was postponed because of diarrhea and stool was tested.  Stool is positive for C. difficile.  Patient has been started on oral vancomycin.  After communicating with general surgery, will DC cefepime and Flagyl. -Continue IV fluids.  Might advance diet if okay with general surgery.  CAD status post PCI -Currently stable.  No chest pain.  Continue aspirin, metoprolol, simvastatin.  Outpatient follow-up with cardiology  COPD Acute hypoxic respiratory failure -Currently stable.  Continue Symbicort and as needed albuterol -Currently still on 3 L oxygen by nasal cannula.  Wean off as able.  Hypokalemia -Improved.  Currently on IV fluids with supplemental potassium.  Chronic kidney disease stage IIIa -Creatinine stable.  Monitor  Hypertension: -Continue Toprol-XL.  Blood pressure improving.  Lisinopril on hold.  Hyperlipidemia: -Continue simvastatin.  BPH: -Continue tamsulosin.    DVT prophylaxis: Subcutaneous heparin Code Status: DNR after discussion with patient's wife on phone Family Communication: Spoke to wife on the phone on 12/29/2020 who agrees for DNR status for her husband Disposition Plan: Status is: Inpatient  Remains inpatient appropriate because:Inpatient level of care appropriate due to severity of illness   Dispo: The patient is from: Home              Anticipated d/c is to: Home              Patient currently is not medically stable to d/c.   Difficult to place patient No  Consultants: General surgery  Procedures: None  Antimicrobials: Cefepime, vancomycin and Flagyl from 12/27/2020 onwards   Subjective:  Patient seen and examined at bedside.  Poor  historian.  No overnight fever or vomiting reported.  Still complains of intermittent abdominal pain with nausea. Objective: Vitals:   12/29/20 0400 12/29/20 0500 12/29/20 0700 12/29/20 0800  BP: (!) 145/53 132/69 (!) 155/111 140/80  Pulse: (!) 55 (!) 108 (!) 44 (!) 112  Resp: 14 15 20 16   Temp:    98.3 F (36.8 C)  TempSrc:    Oral  SpO2: 98% 100% 93% 97%  Weight:      Height:        Intake/Output Summary (Last 24 hours) at 12/29/2020 1001 Last data filed at 12/29/2020 0721 Gross per 24 hour  Intake 2853.71 ml  Output 200 ml  Net 2653.71 ml   Filed Weights   12/27/20 1721 12/29/20 0336  Weight: 71.2 kg 76.1 kg    Examination:  General exam: No acute distress.  Looks chronically ill.  Still on 3 L oxygen by nasal cannula.  Elderly male lying in bed. Respiratory system: Decreased bilateral breath sounds at bases with scattered crackles  cardiovascular system: Intermittently tachycardic and bradycardic; S1-S2 heard Gastrointestinal system: Abdomen is distended mildly, soft and mildly tender diffusely.  Bowel sounds are heard Extremities: Mild lower extremity edema present; no clubbing Central nervous system: Still very slow to respond to questions.  No focal neurological deficits.  Moves extremities Skin: No obvious ecchymosis/lesions Psychiatry: Affect is flat.    Data Reviewed: I have personally reviewed following labs and imaging studies  CBC: Recent Labs  Lab 12/27/20 1752 12/28/20 0514 12/29/20 0256  WBC 15.3* 7.8 7.5  NEUTROABS 11.2*  --  6.7  HGB 12.9* 10.5* 11.1*  HCT 42.1 34.7* 36.0*  MCV 93.1 94.6 94.0  PLT 238 174 [id number]   Basic Metabolic Panel: Recent Labs  Lab 12/27/20 1752 12/28/20 0514 12/29/20 0256  NA 139 139 141  K 4.1 3.2* 4.1  CL 106 110 116*  CO2 24 19* 19*  GLUCOSE 133* 115* 130*  BUN 22 26* 37*  CREATININE 1.48* 1.40* 1.34*  CALCIUM 8.7* 7.9* 7.8*  MG  --   --  2.1   GFR: Estimated Creatinine Clearance: 36.3 mL/min (A) (by C-G  formula based on SCr of 1.34 mg/dL (H)). Liver Function Tests: Recent Labs  Lab 12/27/20 1752 12/28/20 0514 12/29/20 0256  AST 31 31 50*  ALT 19 17 22   ALKPHOS 66 45 43  BILITOT 0.7 0.5 0.4  PROT 7.6 5.3* 5.6*  ALBUMIN 4.0 2.7* 2.8*   Recent Labs  Lab 12/27/20 1752  LIPASE 22   No results for input(s): AMMONIA in the last 168 hours. Coagulation Profile: Recent Labs  Lab 12/27/20 1752  INR 1.3*   Cardiac Enzymes: No results for input(s): CKTOTAL, CKMB, CKMBINDEX, TROPONINI in the last 168 hours. BNP (last 3 results) No results for input(s): PROBNP in the last 8760 hours. HbA1C: No results for input(s): HGBA1C in the last 72 hours. CBG: No results for input(s): GLUCAP in the last 168 hours. Lipid Profile: No results for input(s): CHOL, HDL, LDLCALC, TRIG, CHOLHDL, LDLDIRECT in the last 72 hours. Thyroid Function Tests: No results for input(s): TSH, T4TOTAL, FREET4, T3FREE, THYROIDAB in the last 72 hours. Anemia Panel: No results for input(s): VITAMINB12, FOLATE, FERRITIN, TIBC, IRON, RETICCTPCT in the last 72 hours. Sepsis Labs: Recent Labs  Lab 12/27/20 1752 12/27/20 1952 12/27/20 2132 12/28/20 0514  PROCALCITON  --   --   --  29.44  LATICACIDVEN 3.2* 2.8* 2.5*  --  Recent Results (from the past 240 hour(s))  Blood Culture (routine x 2)     Status: None (Preliminary result)   Collection Time: 12/27/20  5:52 PM   Specimen: BLOOD  Result Value Ref Range Status   Specimen Description   Final    BLOOD SITE NOT SPECIFIED Performed at [facility], [address], [place], [place] [address]    Special Requests   Final    BOTTLES DRAWN AEROBIC AND ANAEROBIC Blood Culture adequate volume Performed at [facility] [address], [place], [place] [address]    Culture   Final    NO GROWTH 2 DAYS Performed at [facility] [address] [place], [place] [address]    Report Status PENDING  Incomplete  Blood Culture (routine  x 2)     Status: None (Preliminary result)   Collection Time: 12/27/20  6:19 PM   Specimen: BLOOD  Result Value Ref Range Status   Specimen Description   Final    BLOOD LEFT ANTECUBITAL Performed at [facility] [address] [place], [place] [address]    Special Requests   Final    BOTTLES DRAWN AEROBIC AND ANAEROBIC Blood Culture results may not be optimal due to an inadequate volume of blood received in culture bottles Performed at [facility] [address] [place], [place] [address]    Culture   Final    NO GROWTH 2 DAYS Performed at [facility] [address] [place], [place] [address]    Report Status PENDING  Incomplete  Resp Panel by RT-PCR (Flu A&B, Covid) Nasopharyngeal Swab     Status: None   Collection Time: 12/27/20  6:21 PM   Specimen: Nasopharyngeal Swab; Nasopharyngeal(NP) swabs in vial transport medium  Result Value Ref Range Status   SARS Coronavirus 2 by RT PCR NEGATIVE NEGATIVE Final    Comment: (NOTE) SARS-CoV-2 target nucleic acids are NOT DETECTED.  The SARS-CoV-2 RNA is generally detectable in upper respiratory specimens during the acute phase of infection. The lowest concentration of SARS-CoV-2 viral copies this assay can detect is 138 copies/mL. A negative result does not preclude SARS-Cov-2 infection and should not be used as the sole basis for treatment or other patient management decisions. A negative result may occur with  improper specimen collection/handling, submission of specimen other than nasopharyngeal swab, presence of viral mutation(s) within the areas targeted by this assay, and inadequate number of viral copies(<138 copies/mL). A negative result must be combined with clinical observations, patient history, and epidemiological information. The expected result is Negative.  Fact Sheet for Patients:  EntrepreneurPulse.com.au  Fact Sheet for Healthcare Providers:   IncredibleEmployment.be  This test is no t yet approved or cleared by the [place] FDA and  has been authorized for detection and/or diagnosis of SARS-CoV-2 by FDA under an Emergency Use Authorization (EUA). This EUA will remain  in effect (meaning this test can be used) for the duration of the COVID-19 declaration under Section 564(b)(1) of the Act, 21 U.S.C.section 360bbb-3(b)(1), unless the authorization is terminated  or revoked sooner.       Influenza A by PCR NEGATIVE NEGATIVE Final   Influenza B by PCR NEGATIVE NEGATIVE Final    Comment: (NOTE) The Xpert Xpress SARS-CoV-2/FLU/RSV plus assay is intended as an aid in the diagnosis of influenza from Nasopharyngeal swab specimens and should not be used as a sole basis for treatment. Nasal washings and aspirates are unacceptable  for Xpert Xpress SARS-CoV-2/FLU/RSV testing.  Fact Sheet for Patients: EntrepreneurPulse.com.au  Fact Sheet for Healthcare Providers: IncredibleEmployment.be  This test is not yet approved or cleared by the [place] FDA and has been authorized for detection and/or diagnosis of SARS-CoV-2 by FDA under an Emergency Use Authorization (EUA). This EUA will remain in effect (meaning this test can be used) for the duration of the COVID-19 declaration under Section 564(b)(1) of the Act, 21 U.S.C. section 360bbb-3(b)(1), unless the authorization is terminated or revoked.  Performed at [facility], [place] [address], [place], [place] [address]   Urine culture     Status: Abnormal   Collection Time: 12/27/20 10:14 PM   Specimen: In/Out Cath Urine  Result Value Ref Range Status   Specimen Description   Final    IN/OUT CATH URINE Performed at [facility] [address], [place], [place] [address]    Special Requests   Final    NONE Performed at [facility], [place] [address]  [place], [place] [address]    Culture MULTIPLE SPECIES PRESENT, SUGGEST RECOLLECTION (A)  Final   Report Status 12/29/2020 FINAL  Final  MRSA PCR Screening     Status: Abnormal   Collection Time: 12/28/20  8:46 AM   Specimen: Nasal Mucosa; Nasopharyngeal  Result Value Ref Range Status   MRSA by PCR POSITIVE (A) NEGATIVE Final    Comment:        The GeneXpert MRSA Assay (FDA approved for NASAL specimens only), is one component of a comprehensive MRSA colonization surveillance program. It is not intended to diagnose MRSA infection nor to guide or monitor treatment for MRSA infections. RESULT CALLED TO, READ BACK BY AND VERIFIED WITH: Leida Lauth RN AT 1112 12/28/20 MULLINS,T Performed at [facility], [place] [address], [place], [place] [address]   C Difficile Quick Screen w PCR reflex     Status: Abnormal   Collection Time: 12/28/20 11:43 AM   Specimen: STOOL  Result Value Ref Range Status   C Diff antigen POSITIVE (A) NEGATIVE Final   C Diff toxin POSITIVE (A) NEGATIVE Final   C Diff interpretation Toxin producing C. difficile detected.  Final    Comment: CRITICAL RESULT CALLED TO, READ BACK BY AND VERIFIED WITH: L.CAUDLE, RN AT 1757 ON 05.18.22 BY N.THOMPSON Performed at [facility] [address], [place], [place] [address]   Gastrointestinal Panel by PCR , Stool     Status: None   Collection Time: 12/28/20 11:43 AM   Specimen: STOOL  Result Value Ref Range Status   Campylobacter species NOT DETECTED NOT DETECTED Final   Plesimonas shigelloides NOT DETECTED NOT DETECTED Final   Salmonella species NOT DETECTED NOT DETECTED Final   Yersinia enterocolitica NOT DETECTED NOT DETECTED Final   Vibrio species NOT DETECTED NOT DETECTED Final   Vibrio cholerae NOT DETECTED NOT DETECTED Final   Enteroaggregative E coli (EAEC) NOT DETECTED NOT DETECTED Final   Enteropathogenic E coli (EPEC) NOT DETECTED NOT DETECTED Final   Enterotoxigenic E coli  (ETEC) NOT DETECTED NOT DETECTED Final   Shiga like toxin producing E coli (STEC) NOT DETECTED NOT DETECTED Final   Shigella/Enteroinvasive E coli (EIEC) NOT DETECTED NOT DETECTED Final   Cryptosporidium NOT DETECTED NOT DETECTED Final   Cyclospora cayetanensis NOT DETECTED NOT DETECTED Final   Entamoeba histolytica NOT DETECTED NOT DETECTED Final   Giardia lamblia NOT DETECTED NOT DETECTED Final   Adenovirus F40/41 NOT DETECTED NOT DETECTED  Final   Astrovirus NOT DETECTED NOT DETECTED Final   Norovirus GI/GII NOT DETECTED NOT DETECTED Final   Rotavirus A NOT DETECTED NOT DETECTED Final   Sapovirus (I, II, IV, and V) NOT DETECTED NOT DETECTED Final    Comment: Performed at [facility], [address], [place], [place] [address]         Radiology Studies: CT Abdomen Pelvis Wo Contrast  Result Date: 12/27/2020 CLINICAL DATA:  Abdominal distention and fever. EXAM: CT ABDOMEN AND PELVIS WITHOUT CONTRAST TECHNIQUE: Multidetector CT imaging of the abdomen and pelvis was performed following the standard protocol without IV contrast. COMPARISON:  November 26, 2020 FINDINGS: Lower chest: No acute abnormality. Hepatobiliary: No focal liver abnormality is seen. The gallbladder is contracted and stable in appearance without gallstones or gallbladder wall thickening. Mild, stable, central intrahepatic biliary dilatation is noted. Pancreas: Unremarkable. No pancreatic ductal dilatation or surrounding inflammatory changes. Spleen: Normal in size without focal abnormality. Adrenals/Urinary Tract: Adrenal glands are unremarkable. Kidneys are normal in size, without renal calculi or hydronephrosis. A stable 2.9 cm diameter cyst is seen within the upper pole of the left kidney. Bladder is unremarkable. Stomach/Bowel: Stomach is within normal limits. The appendix is surgically absent. No evidence of bowel wall thickening, distention, or inflammatory changes. Vascular/Lymphatic: Aortic atherosclerosis. No  enlarged abdominal or pelvic lymph nodes. Reproductive: There is moderate to marked severity prostate gland enlargement. Other: A stable 2.7 cm x 1.6 cm fat containing left inguinal hernia is noted. No abdominopelvic ascites. Musculoskeletal: Grade 1 anterolisthesis of the L5 vertebral body is noted on S1 with marked severity degenerative changes again noted at the levels of L4-L5 and L5-S1. IMPRESSION: 1. Prostatomegaly. 2. Stable appearance of the gallbladder with mild, stable central intrahepatic biliary dilatation. 3. Extensive chronic and degenerative changes within the lower lumbar spine. Electronically Signed   By: Virgina Norfolk M.D.   On: 12/27/2020 19:20   DG Chest Port 1 View  Result Date: 12/27/2020 CLINICAL DATA:  Fever. EXAM: PORTABLE CHEST 1 VIEW COMPARISON:  November 27, 2020 FINDINGS: There is no evidence of an acute infiltrate, pleural effusion or pneumothorax. The heart size and mediastinal contours are within normal limits. The visualized skeletal structures are unremarkable. IMPRESSION: No acute cardiopulmonary disease. Electronically Signed   By: Virgina Norfolk M.D.   On: 12/27/2020 19:11   US Abdomen Limited RUQ (LIVER/GB)  Result Date: 12/27/2020 CLINICAL DATA:  Severe sepsis. EXAM: ULTRASOUND ABDOMEN LIMITED RIGHT UPPER QUADRANT COMPARISON:  November 26, 2020 FINDINGS: Gallbladder: Shadowing echogenic gallstones are seen within the lumen of a contracted gallbladder. The largest measures approximately 1.0 cm. There is no evidence of gallbladder wall thickening (2.4 mm). No sonographic Murphy sign noted by sonographer. Common bile duct: Diameter: 6.9 mm Liver: No focal lesion identified. Within normal limits in parenchymal echogenicity. Portal vein is patent on color Doppler imaging with normal direction of blood flow towards the liver. Other: None. IMPRESSION: Cholelithiasis without evidence of acute cholecystitis. Electronically Signed   By: Virgina Norfolk M.D.   On: 12/27/2020  22:46        Scheduled Meds: . aspirin EC  81 mg Oral Daily  . bupivacaine liposome  20 mL Infiltration Once  . Chlorhexidine Gluconate Cloth  6 each Topical Daily  . heparin  5,000 Units Subcutaneous Q8H  . mouth rinse  15 mL Mouth Rinse BID  . metoprolol succinate  25 mg Oral Daily  . mometasone-formoterol  2 puff Inhalation BID  . mupirocin ointment  1  application Nasal BID  . pantoprazole  40 mg Oral Daily  . simvastatin  40 mg Oral QPM  . sodium chloride flush  3 mL Intravenous Q12H  . tamsulosin  0.4 mg Oral Daily  . vancomycin  125 mg Oral Q6H   Continuous Infusions: . 0.9 % NaCl with KCl 40 mEq / L 100 mL/hr at 12/29/20 [id number]          Aline August, MD [facility] 12/29/2020, 10:01 AM

## 2020-12-29 NOTE — Progress Notes (Signed)
Tried to call wife home number was busy and cell number said no voicemail set up.

## 2020-12-30 ENCOUNTER — Inpatient Hospital Stay (HOSPITAL_COMMUNITY): Payer: PPO

## 2020-12-30 DIAGNOSIS — I4891 Unspecified atrial fibrillation: Secondary | ICD-10-CM

## 2020-12-30 DIAGNOSIS — I10 Essential (primary) hypertension: Secondary | ICD-10-CM | POA: Diagnosis not present

## 2020-12-30 DIAGNOSIS — I480 Paroxysmal atrial fibrillation: Secondary | ICD-10-CM

## 2020-12-30 DIAGNOSIS — A419 Sepsis, unspecified organism: Secondary | ICD-10-CM | POA: Diagnosis not present

## 2020-12-30 DIAGNOSIS — A0472 Enterocolitis due to Clostridium difficile, not specified as recurrent: Secondary | ICD-10-CM | POA: Diagnosis not present

## 2020-12-30 DIAGNOSIS — R6520 Severe sepsis without septic shock: Secondary | ICD-10-CM | POA: Diagnosis not present

## 2020-12-30 DIAGNOSIS — K812 Acute cholecystitis with chronic cholecystitis: Secondary | ICD-10-CM | POA: Diagnosis not present

## 2020-12-30 LAB — COMPREHENSIVE METABOLIC PANEL
ALT: 29 U/L (ref 0–44)
AST: 51 U/L — ABNORMAL HIGH (ref 15–41)
Albumin: 3 g/dL — ABNORMAL LOW (ref 3.5–5.0)
Alkaline Phosphatase: 51 U/L (ref 38–126)
Anion gap: 9 (ref 5–15)
BUN: 35 mg/dL — ABNORMAL HIGH (ref 8–23)
CO2: 18 mmol/L — ABNORMAL LOW (ref 22–32)
Calcium: 8.4 mg/dL — ABNORMAL LOW (ref 8.9–10.3)
Chloride: 116 mmol/L — ABNORMAL HIGH (ref 98–111)
Creatinine, Ser: 1.24 mg/dL (ref 0.61–1.24)
GFR, Estimated: 56 mL/min — ABNORMAL LOW (ref >60)
Glucose, Bld: 113 mg/dL — ABNORMAL HIGH (ref 70–99)
Potassium: 4.2 mmol/L (ref 3.5–5.1)
Sodium: 143 mmol/L (ref 135–145)
Total Bilirubin: 0.7 mg/dL (ref 0.3–1.2)
Total Protein: 6.5 g/dL (ref 6.5–8.1)

## 2020-12-30 LAB — CBC WITH DIFFERENTIAL/PLATELET
Abs Immature Granulocytes: 0.08 10*3/uL — ABNORMAL HIGH (ref 0.00–0.07)
Basophils Absolute: 0.1 10*3/uL (ref 0.0–0.1)
Basophils Relative: 1 %
Eosinophils Absolute: 0 10*3/uL (ref 0.0–0.5)
Eosinophils Relative: 0 %
HCT: 37 % — ABNORMAL LOW (ref 39.0–52.0)
Hemoglobin: 11.1 g/dL — ABNORMAL LOW (ref 13.0–17.0)
Immature Granulocytes: 1 %
Lymphocytes Relative: 10 %
Lymphs Abs: 1.2 10*3/uL (ref 0.7–4.0)
MCH: 28.2 pg (ref 26.0–34.0)
MCHC: 30 g/dL (ref 30.0–36.0)
MCV: 93.9 fL (ref 80.0–100.0)
Monocytes Absolute: 1.6 10*3/uL — ABNORMAL HIGH (ref 0.1–1.0)
Monocytes Relative: 13 %
Neutro Abs: 9.2 10*3/uL — ABNORMAL HIGH (ref 1.7–7.7)
Neutrophils Relative %: 75 %
Platelets: 234 10*3/uL (ref 150–400)
RBC: 3.94 MIL/uL — ABNORMAL LOW (ref 4.22–5.81)
RDW: 14.6 % (ref 11.5–15.5)
WBC: 12.2 10*3/uL — ABNORMAL HIGH (ref 4.0–10.5)
nRBC: 0 % (ref 0.0–0.2)

## 2020-12-30 LAB — ECHOCARDIOGRAM COMPLETE
Area-P 1/2: 6.02 cm2
Height: 67 in
S' Lateral: 3.7 cm
Weight: 2740.76 oz

## 2020-12-30 LAB — AMMONIA: Ammonia: 14 umol/L (ref 9–35)

## 2020-12-30 LAB — MAGNESIUM: Magnesium: 2.2 mg/dL (ref 1.7–2.4)

## 2020-12-30 MED ORDER — HALOPERIDOL LACTATE 5 MG/ML IJ SOLN
2.0000 mg | Freq: Once | INTRAMUSCULAR | Status: AC
  Administered 2020-12-30: 2 mg via INTRAVENOUS
  Filled 2020-12-30: qty 1

## 2020-12-30 MED ORDER — METOPROLOL TARTRATE 25 MG PO TABS
25.0000 mg | ORAL_TABLET | Freq: Four times a day (QID) | ORAL | Status: DC
  Administered 2020-12-30 – 2021-01-14 (×45): 25 mg via ORAL
  Filled 2020-12-30 (×50): qty 1

## 2020-12-30 MED ORDER — METHYLPREDNISOLONE SODIUM SUCC 125 MG IJ SOLR
125.0000 mg | Freq: Once | INTRAMUSCULAR | Status: AC
  Administered 2020-12-30: 125 mg via INTRAVENOUS
  Filled 2020-12-30: qty 2

## 2020-12-30 MED ORDER — METOPROLOL TARTRATE 5 MG/5ML IV SOLN
2.5000 mg | Freq: Once | INTRAVENOUS | Status: AC
  Administered 2020-12-30: 2.5 mg via INTRAVENOUS
  Filled 2020-12-30: qty 5

## 2020-12-30 MED ORDER — HALOPERIDOL LACTATE 5 MG/ML IJ SOLN
1.0000 mg | Freq: Four times a day (QID) | INTRAMUSCULAR | Status: DC | PRN
  Administered 2020-12-30: 2 mg via INTRAVENOUS
  Administered 2020-12-30: 1 mg via INTRAVENOUS
  Administered 2021-01-01: 2 mg via INTRAVENOUS
  Filled 2020-12-30 (×3): qty 1

## 2020-12-30 MED ORDER — METOPROLOL SUCCINATE ER 50 MG PO TB24
50.0000 mg | ORAL_TABLET | Freq: Every day | ORAL | Status: DC
  Filled 2020-12-30: qty 1

## 2020-12-30 MED ORDER — FUROSEMIDE 10 MG/ML IJ SOLN
60.0000 mg | Freq: Once | INTRAMUSCULAR | Status: AC
  Administered 2020-12-30: 60 mg via INTRAVENOUS
  Filled 2020-12-30: qty 6

## 2020-12-30 MED ORDER — LORAZEPAM 2 MG/ML IJ SOLN
0.5000 mg | Freq: Once | INTRAMUSCULAR | Status: AC
  Administered 2020-12-30: 0.5 mg via INTRAVENOUS
  Filled 2020-12-30: qty 1

## 2020-12-30 MED ORDER — IPRATROPIUM-ALBUTEROL 0.5-2.5 (3) MG/3ML IN SOLN
3.0000 mL | Freq: Once | RESPIRATORY_TRACT | Status: AC
  Administered 2020-12-30: 3 mL via RESPIRATORY_TRACT
  Filled 2020-12-30: qty 3

## 2020-12-30 MED ORDER — LORAZEPAM 2 MG/ML IJ SOLN: 1.0000 mg | Freq: Once | INTRAMUSCULAR | Status: DC

## 2020-12-30 MED ORDER — METRONIDAZOLE 500 MG/100ML IV SOLN
500.0000 mg | Freq: Three times a day (TID) | INTRAVENOUS | Status: DC
  Administered 2020-12-30 – 2021-01-09 (×31): 500 mg via INTRAVENOUS
  Filled 2020-12-30 (×31): qty 100

## 2020-12-30 MED ORDER — VANCOMYCIN 50 MG/ML ORAL SOLUTION
500.0000 mg | Freq: Four times a day (QID) | ORAL | Status: DC
  Administered 2020-12-30 – 2021-01-02 (×13): 500 mg via ORAL
  Filled 2020-12-30 (×14): qty 10

## 2020-12-30 NOTE — Progress Notes (Signed)
Progress Note  1 Day Post-Op  Subjective: Rapid response in the room this am.  Apparently patient with more tachy this am and sedated.  Got 0.5mg  ativan and dilaudid overnight.  Doesn't wake up for me this am.    Objective: Vital signs in last 24 hours: Temp:  [97.6 F (36.4 C)-98.6 F (37 C)] 98.3 F (36.8 C) (05/20 0923) Pulse Rate:  [34-167] 124 (05/20 0923) Resp:  [15-20] 20 (05/20 0923) BP: (132-173)/(62-116) 159/116 (05/20 0923) SpO2:  [89 %-100 %] 95 % (05/20 0923) Weight:  [77.7 kg] 77.7 kg (05/20 0500) Last BM Date: 12/29/20  Intake/Output from previous day: 05/19 0701 - 05/20 0700 In: 2593.9 [I.V.:2549.9; IV Piggyback:44.1] Out: 1350 [Urine:1350] Intake/Output this shift: Total I/O In: -  Out: 300 [Urine:300]  PE: General: sedated  Heart: tachycardic   Lungs: few rhonchi bilaterally, use of accessory abdominal muscles present Abd: soft, unable to tell about pain due to AMS.  Distended, but still soft in between his "belly breathing events" Psych: AMS   Lab Results:  Recent Labs    12/29/20 0256 12/30/20 0751  WBC 7.5 12.2*  HGB 11.1* 11.1*  HCT 36.0* 37.0*  PLT 164 234   BMET Recent Labs    12/29/20 0256 12/30/20 0751  NA 141 143  K 4.1 4.2  CL 116* 116*  CO2 19* 18*  GLUCOSE 130* 113*  BUN 37* 35*  CREATININE 1.34* 1.24  CALCIUM 7.8* 8.4*   PT/INR Recent Labs    12/27/20 1752  LABPROT 16.3*  INR 1.3*   CMP     Component Value Date/Time   NA 143 12/30/2020 0751   NA 135 (L) 04/29/2013 1543   K 4.2 12/30/2020 0751   K 3.3 (L) 04/29/2013 1543   CL 116 (H) 12/30/2020 0751   CL 102 04/29/2013 1543   CO2 18 (L) 12/30/2020 0751   CO2 27 04/29/2013 1543   GLUCOSE 113 (H) 12/30/2020 0751   GLUCOSE 120 (H) 04/29/2013 1543   BUN 35 (H) 12/30/2020 0751   BUN 17 04/29/2013 1543   CREATININE 1.24 12/30/2020 0751   CREATININE 2.47 (H) 09/09/2020 1343   CALCIUM 8.4 (L) 12/30/2020 0751   CALCIUM 9.8 04/29/2013 1543   PROT 6.5  12/30/2020 0751   PROT 7.9 04/29/2013 1543   ALBUMIN 3.0 (L) 12/30/2020 0751   ALBUMIN 3.8 04/29/2013 1543   AST 51 (H) 12/30/2020 0751   AST 30 04/29/2013 1543   ALT 29 12/30/2020 0751   ALT 24 04/29/2013 1543   ALKPHOS 51 12/30/2020 0751   ALKPHOS 79 04/29/2013 1543   BILITOT 0.7 12/30/2020 0751   BILITOT 0.4 04/29/2013 1543   GFRNONAA 56 (L) 12/30/2020 0751   GFRNONAA >60 04/29/2013 1543   GFRAA 42 (L) 05/04/2017 1945   GFRAA >60 04/29/2013 1543   Lipase     Component Value Date/Time   LIPASE 22 12/27/2020 1752   LIPASE 154 04/29/2013 1543       Studies/Results: No results found.  Anti-infectives: Anti-infectives (From admission, onward)   Start     Dose/Rate Route Frequency Ordered Stop   12/29/20 0600  vancomycin (VANCOREADY) IVPB 1250 mg/250 mL  Status:  Discontinued        1,250 mg 166.7 mL/hr over 90 Minutes Intravenous Every 36 hours 12/27/20 2315 12/28/20 1007   12/28/20 2000  vancomycin (VANCOCIN) 50 mg/mL oral solution 125 mg        125 mg Oral Every 6 hours 12/28/20 1801  12/28/20 0830  clindamycin (CLEOCIN) IVPB 900 mg  Status:  Discontinued       "And" Linked Group Details   900 mg 100 mL/hr over 30 Minutes Intravenous On call to O.R. 12/28/20 7342 12/29/20 0559   12/28/20 0830  gentamicin (GARAMYCIN) 360 mg in dextrose 5 % 100 mL IVPB  Status:  Discontinued       "And" Linked Group Details   5 mg/kg  71.2 kg 109 mL/hr over 60 Minutes Intravenous On call to O.R. 12/28/20 8768 12/29/20 0559   12/28/20 0600  ceFEPIme (MAXIPIME) 2 g in sodium chloride 0.9 % 100 mL IVPB  Status:  Discontinued        2 g 200 mL/hr over 30 Minutes Intravenous Every 12 hours 12/27/20 2315 12/29/20 0955   12/28/20 0200  metroNIDAZOLE (FLAGYL) IVPB 500 mg  Status:  Discontinued        500 mg 100 mL/hr over 60 Minutes Intravenous Every 8 hours 12/27/20 2202 12/29/20 0955   12/27/20 1830  aztreonam (AZACTAM) 2 g in sodium chloride 0.9 % 100 mL IVPB  Status:  Discontinued         2 g 200 mL/hr over 30 Minutes Intravenous  Once 12/27/20 1820 12/27/20 1823   12/27/20 1830  metroNIDAZOLE (FLAGYL) IVPB 500 mg        500 mg 100 mL/hr over 60 Minutes Intravenous  Once 12/27/20 1820 12/27/20 1935   12/27/20 1830  vancomycin (VANCOCIN) IVPB 1000 mg/200 mL premix        1,000 mg 200 mL/hr over 60 Minutes Intravenous  Once 12/27/20 1820 12/27/20 1934   12/27/20 1830  ceFEPIme (MAXIPIME) 2 g in sodium chloride 0.9 % 100 mL IVPB        2 g 200 mL/hr over 30 Minutes Intravenous  Once 12/27/20 1824 12/27/20 1903       Assessment/Plan CAD status post stenting to RCA in 1997 COPD CKD stage III - Cr 1.34  Hypertension Hyperlipidemia BPH  Cholelithiasis Chronic cholecystitis - follow up with Dr. Redmond Pulling as outpatient as C diff source of infection this admit.  C. Diff colitis  - abd film shows colonic distention but no perforation.  Currently abdomen seems soft and he did not guard or groan with deep palpation.  I do not believe he has an acute abdomen of fulminant c diff/toxic megacolon at this time. -we will follow for now, but nothing surgical at this point -diet per medicine as mental status improves  FEN: NPO with ice chips, IVF VTE: SQ heparin  ID: vanc 5/17; Cefepime/flagyl 5/17>5/19, gent/clinda 5/18; PO VANC 5/19>>  LOS: 3 days    Leon Estes, [facility] 12/30/2020, 9:39 AM Please see Amion for pager number during day hours 7:00am-4:30pm

## 2020-12-30 NOTE — Consult Note (Signed)
Cardiology Consultation:   Patient ID: [name] MRN: [id number]; DOB: 03-04-1931  Admit date: 12/27/2020 Date of Consult: 12/30/2020  PCP:  Venia Carbon, MD   [facility] Providers Cardiologist:  Mertie Moores, MD        Patient Profile:   Leon Estes is a 87 y.o. male with a PMH of CAD s/p stenting to RCA in 1997, HTN, HLD, COPD, and recent cholecystitis managed conservatively who is being seen 12/30/2020 for the evaluation of atrial fibrillation at the request of Dr. Starla Link.  History of Present Illness:   [name] was admitted to [facility] 11/26/20-11/29/20 for presumed acute cholecystitis. RUQ [place] and CT A/P were equivocal and HIDA scan was c/w cystic duct obstruction.There was concern for possible atrial fibrillation, however was felt to have ectopic atrial rhythm with PVCs after careful review of EKGs and telemetry. Surgery was deferred at that time in lieu of antibiotics. He was seen by general surgery outpatient with plans to undergo laparoscopic CCY 01/10/21, however upon waking 12/27/20 he felt generalized weakness, fevers, diaphoresis, and nausea without vomiting prompting him to present to [facility] ED for further evaluation.   In the ED he was febrile, mildly hypoxic, tachycardic, and tachypneic with stable BP's. Labs notable for leukocytosis, Hgb 12.9, PLT 238, electrolytes wnl, Cr 1.48 (baseline ~1.1), LFTs wnl, elevated lactate, and BNP 689. EKG this morning showed atrial fibrillation with RVR, incomplete RBBB, non-specific T wave abnormalities, no STE/D. CXR showed no acute findings. CT A/P with stable appearing gallbladder with mild intrahepatic biliary dilation. He was started on IV antibiotics and admitted to medicine for sepsis. He subsequently developed diarrhea and was found to have Cdiff. Surgery consulted and prefers to delay CCY until patient is improved from a Cdiff perspective. On the morning of 12/30/20 patient was noted to be tachycardic to the 140s. EKG concerning  for possible atrial fibrillation with RVR prompting cardiology consult.    Past Medical History:  Diagnosis Date  . Asthma   . BPH (benign prostatic hypertrophy)   . Cataract   . Chronic renal disease, stage III ([place])   . Chronic sinusitis   . COPD (chronic obstructive pulmonary disease) ([place])   . Coronary artery disease    2 stents  . Gall stones   . Hearing loss    DOES NOT WEAR HEARING AIDS  . High cholesterol   . Hypertension   . Macular degeneration    right eye    Past Surgical History:  Procedure Laterality Date  . APPENDECTOMY    . CORONARY ANGIOPLASTY WITH STENT PLACEMENT  1998   2 stents  . TRANSURETHRAL RESECTION OF PROSTATE       Home Medications:  Prior to Admission medications   Medication Sig Start Date End Date Taking? Authorizing Provider  aspirin EC 81 MG tablet Take 81 mg by mouth daily.   Yes [provider]  gabapentin (NEURONTIN) 300 MG capsule Take 300 mg by mouth at bedtime.   Yes [provider]  lisinopril (ZESTRIL) 2.5 MG tablet Take 1 tablet (2.5 mg total) by mouth daily. 11/30/20 12/30/20 Yes Nolberto Hanlon, MD  metoprolol succinate (TOPROL-XL) 25 MG 24 hr tablet Take 1 tablet (25 mg total) by mouth daily. 08/02/20  Yes Venia Carbon, MD  pantoprazole (PROTONIX) 40 MG tablet TAKE 1 TABLET BY MOUTH ONCE A DAY Patient taking differently: Take 40 mg by mouth daily. 08/15/20  Yes Venia Carbon, MD  simvastatin (ZOCOR) 40 MG tablet Take 40  mg by mouth every evening.   Yes [provider]  tamsulosin (FLOMAX) 0.4 MG CAPS capsule TAKE 1 CAPSULE BY MOUTH ONCE DAILY Patient taking differently: Take 0.4 mg by mouth daily. 07/14/20  Yes Venia Carbon, MD  traMADol (ULTRAM) 50 MG tablet Take 50 mg by mouth 2 (two) times daily as needed for moderate pain. 08/15/17  Yes [provider]  betamethasone dipropionate 0.05 % cream Apply topically 2 (two) times daily. Patient not taking: Reported on 12/27/2020 08/23/20   Edrick Kins, DPM  budesonide-formoterol (SYMBICORT) 160-4.5 MCG/ACT inhaler INHALE 2 PUFFS INTO THE LUNGS TWICE DAILY Patient taking differently: Inhale 2 puffs into the lungs 2 (two) times daily. 09/28/20   Venia Carbon, MD  gentamicin cream (GARAMYCIN) 0.1 % APPLY 1 APPLICATION TOPICALLY TWICE DAILY Patient not taking: Reported on 12/27/2020 08/21/20   Edrick Kins, DPM  NONFORMULARY OR COMPOUNDED ITEM See pharmacy note 06/25/19   Felipa Furnace, DPM    Inpatient Medications: Scheduled Meds: . aspirin EC  81 mg Oral Daily  . bupivacaine liposome  20 mL Infiltration Once  . Chlorhexidine Gluconate Cloth  6 each Topical Daily  . heparin  5,000 Units Subcutaneous Q8H  . mouth rinse  15 mL Mouth Rinse BID  . metoprolol tartrate  25 mg Oral Q6H  . mometasone-formoterol  2 puff Inhalation BID  . mupirocin ointment  1 application Nasal BID  . pantoprazole  40 mg Oral Daily  . simvastatin  40 mg Oral QPM  . sodium chloride flush  3 mL Intravenous Q12H  . tamsulosin  0.4 mg Oral Daily  . vancomycin  500 mg Oral Q6H   Continuous Infusions: . metronidazole     PRN Meds: acetaminophen, albuterol, haloperidol lactate, methocarbamol, metoprolol tartrate, ondansetron (ZOFRAN) IV  Allergies:    Allergies  Allergen Reactions  . Sulfa Antibiotics Rash  . Benzalkonium Chloride     "Benzalkonium chloride is a quaternary ammonium antiseptic and disinfectant with actions and uses similar to those of other cationic surfactants. It is also used as an antimicrobial preservative for pharmaceutical products."  . Neosporin [Neomycin-Bacitracin Zn-Polymyx]   . Augmentin [Amoxicillin-Pot Clavulanate] Rash  . Terbinafine Rash  . Terbinafine And Related Rash    Social History:   Social History   Socioeconomic History  . Marital status: Married    Spouse name: Not on file  . Number of children: 4  . Years of education: Not on file  . Highest education level: Not on file  Occupational History  .  Occupation: Drove truck and concrete work  Tobacco Use  . Smoking status: Former Smoker    Types: Cigarettes    Quit date: 08/13/2005    Years since quitting: 15.3  . Smokeless tobacco: Current User    Types: Chew  . Tobacco comment: discussed stopping this (only once in a while)  Vaping Use  . Vaping Use: Never used  Substance and Sexual Activity  . Alcohol use: No  . Drug use: No  . Sexual activity: Not on file  Other Topics Concern  . Not on file  Social History Narrative   No living will   Wife, then [place] daughter should make decisions   Would accept resuscitation   Would probably accept tube feeds   Social Determinants of Health   Financial Resource Strain: Not on file  Food Insecurity: Not on file  Transportation Needs: Not on file  Physical Activity: Not on file  Stress: Not on  file  Social Connections: Not on file  Intimate Partner Violence: Not on file    Family History:    Family History  Problem Relation Age of Onset  . Diabetes Father   . Heart disease Brother      ROS:  Please see the history of present illness.   All other ROS reviewed and negative.     Physical Exam/Data:   Vitals:   12/30/20 0853 12/30/20 0856 12/30/20 0859 12/30/20 0923  BP: (!) 141/98 (!) 141/98 (!) 159/102 (!) 159/116  Pulse:  (!) 55 92 (!) 124  Resp:  16 20 20   Temp:  97.7 F (36.5 C) 98.2 F (36.8 C) 98.3 F (36.8 C)  TempSrc:  Oral  Oral  SpO2:  100% 96% 95%  Weight:      Height:        Intake/Output Summary (Last 24 hours) at 12/30/2020 1019 Last data filed at 12/30/2020 1000 Gross per 24 hour  Intake 1597.83 ml  Output 2650 ml  Net -1052.17 ml   Last 3 Weights 12/30/2020 12/29/2020 12/27/2020  Weight (lbs) 171 lb 4.8 oz 167 lb 12.3 oz 157 lb  Weight (kg) 77.7 kg 76.1 kg 71.215 kg     Body mass index is 26.83 kg/m.  General:  Chronically ill appearing gentleman with mittens in place in NAD HEENT: scleara anicteric, dry mucus membranes Lymph: no  adenopathy Neck: no JVD Endocrine:  No thryomegaly Vascular: No carotid bruits; distal pulses 2+ bilaterally  Cardiac:  normal S1, S2; IRIR; no murmurs, rubs, or gallops Lungs:  clear to auscultation bilaterally, no wheezing, rhonchi or rales  Abd: soft, distended, no hepatomegaly  Ext: no edema Musculoskeletal:  No deformities, BUE and BLE strength normal and equal Skin: warm and dry  Neuro:  CNs 2-12 intact, no focal abnormalities noted Psych:  Confused but pleasant  EKG:  The EKG was personally reviewed and demonstrates:  atrial fibrillation with RVR, incomplete RBBB, non-specific T wave abnormalities, no STE/D Telemetry:  Telemetry was personally reviewed and demonstrates:  Possible ectopic atrial tachycardia initially, though now more c/w atrial fibrillation with RVR with frequent PVCs  Relevant CV Studies: None  Laboratory Data:  High Sensitivity Troponin:  No results for input(s): TROPONINIHS in the last 720 hours.   Chemistry Recent Labs  Lab 12/28/20 0514 12/29/20 0256 12/30/20 0751  NA 139 141 143  K 3.2* 4.1 4.2  CL 110 116* 116*  CO2 19* 19* 18*  GLUCOSE 115* 130* 113*  BUN 26* 37* 35*  CREATININE 1.40* 1.34* 1.24  CALCIUM 7.9* 7.8* 8.4*  GFRNONAA 49* 51* 56*  ANIONGAP 10 6 9     Recent Labs  Lab 12/28/20 0514 12/29/20 0256 12/30/20 0751  PROT 5.3* 5.6* 6.5  ALBUMIN 2.7* 2.8* 3.0*  AST 31 50* 51*  ALT 17 22 29   ALKPHOS 45 43 51  BILITOT 0.5 0.4 0.7   Hematology Recent Labs  Lab 12/28/20 0514 12/29/20 0256 12/30/20 0751  WBC 7.8 7.5 12.2*  RBC 3.67* 3.83* 3.94*  HGB 10.5* 11.1* 11.1*  HCT 34.7* 36.0* 37.0*  MCV 94.6 94.0 93.9  MCH 28.6 29.0 28.2  MCHC 30.3 30.8 30.0  RDW 14.4 14.6 14.6  PLT 174 164 234   BNP Recent Labs  Lab 12/27/20 1754  BNP 689.3*    DDimer No results for input(s): DDIMER in the last 168 hours.   Radiology/Studies:  CT Abdomen Pelvis Wo Contrast  Result Date: 12/27/2020 CLINICAL DATA:  Abdominal distention  and fever.  EXAM: CT ABDOMEN AND PELVIS WITHOUT CONTRAST TECHNIQUE: Multidetector CT imaging of the abdomen and pelvis was performed following the standard protocol without IV contrast. COMPARISON:  November 26, 2020 FINDINGS: Lower chest: No acute abnormality. Hepatobiliary: No focal liver abnormality is seen. The gallbladder is contracted and stable in appearance without gallstones or gallbladder wall thickening. Mild, stable, central intrahepatic biliary dilatation is noted. Pancreas: Unremarkable. No pancreatic ductal dilatation or surrounding inflammatory changes. Spleen: Normal in size without focal abnormality. Adrenals/Urinary Tract: Adrenal glands are unremarkable. Kidneys are normal in size, without renal calculi or hydronephrosis. A stable 2.9 cm diameter cyst is seen within the upper pole of the left kidney. Bladder is unremarkable. Stomach/Bowel: Stomach is within normal limits. The appendix is surgically absent. No evidence of bowel wall thickening, distention, or inflammatory changes. Vascular/Lymphatic: Aortic atherosclerosis. No enlarged abdominal or pelvic lymph nodes. Reproductive: There is moderate to marked severity prostate gland enlargement. Other: A stable 2.7 cm x 1.6 cm fat containing left inguinal hernia is noted. No abdominopelvic ascites. Musculoskeletal: Grade 1 anterolisthesis of the L5 vertebral body is noted on S1 with marked severity degenerative changes again noted at the levels of L4-L5 and L5-S1. IMPRESSION: 1. Prostatomegaly. 2. Stable appearance of the gallbladder with mild, stable central intrahepatic biliary dilatation. 3. Extensive chronic and degenerative changes within the lower lumbar spine. Electronically Signed   By: Virgina Norfolk M.D.   On: 12/27/2020 19:20   DG Chest Port 1 View  Result Date: 12/27/2020 CLINICAL DATA:  Fever. EXAM: PORTABLE CHEST 1 VIEW COMPARISON:  November 27, 2020 FINDINGS: There is no evidence of an acute infiltrate, pleural effusion or  pneumothorax. The heart size and mediastinal contours are within normal limits. The visualized skeletal structures are unremarkable. IMPRESSION: No acute cardiopulmonary disease. Electronically Signed   By: Virgina Norfolk M.D.   On: 12/27/2020 19:11   DG ABD ACUTE 2+V W 1V CHEST  Result Date: 12/30/2020 CLINICAL DATA:  Abdominal distension. Reported Clostridium difficile colitis EXAM: DG ABDOMEN ACUTE WITH 1 VIEW CHEST COMPARISON:  CT abdomen and pelvis Dec 27, 2020. Chest radiograph Dec 27, 2020 FINDINGS: PA chest: There is mild left base atelectasis. The lungs elsewhere are clear. Heart is mildly enlarged with pulmonary vascularity normal. No appreciable adenopathy. Supine and upright abdomen: There are loops of mildly dilated colon. No air-fluid levels. No appreciable small bowel dilatation. No air-fluid levels. No free air. No abnormal calcifications. IMPRESSION: Question a degree of colonic ileus or colitis. No obstructing focus evident. No free air. Left base atelectasis. Lungs elsewhere clear. Mild cardiac enlargement. Electronically Signed   By: Lowella Grip III M.D.   On: 12/30/2020 09:41   US Abdomen Limited RUQ (LIVER/GB)  Result Date: 12/27/2020 CLINICAL DATA:  Severe sepsis. EXAM: ULTRASOUND ABDOMEN LIMITED RIGHT UPPER QUADRANT COMPARISON:  November 26, 2020 FINDINGS: Gallbladder: Shadowing echogenic gallstones are seen within the lumen of a contracted gallbladder. The largest measures approximately 1.0 cm. There is no evidence of gallbladder wall thickening (2.4 mm). No sonographic Murphy sign noted by sonographer. Common bile duct: Diameter: 6.9 mm Liver: No focal lesion identified. Within normal limits in parenchymal echogenicity. Portal vein is patent on color Doppler imaging with normal direction of blood flow towards the liver. Other: None. IMPRESSION: Cholelithiasis without evidence of acute cholecystitis. Electronically Signed   By: Virgina Norfolk M.D.   On: 12/27/2020 22:46      Assessment and Plan:   1. New onset atrial fibrillation with RVR: patient here with sepsis in the  setting of C-diff colitis and possible ongoing cholecystitis. He has been tachycardic this admission with EKG c/w atrial fibrillation with RVR today, confirmed on telemetry. He is confused today with mittens in place and intermittently compliant with po intake. Likely being driven by underlying infection. TSH pending for tomorrow AM.  - Will update an echocardiogram to evaluate LV function and valve function - Will transition to metoprolol tartrate 25mg  q6h for rate control  - Could consider diltiazem gtt if rates difficult to control  - CHA2DS2-VASc Score = 4 [CHF History: No, HTN History: Yes, Diabetes History: No, Stroke History: No, Vascular Disease History: Yes, Age Score: 2, Gender Score: 0].  Therefore, the patient's annual risk of stroke is 4.8 %.    - Despite elevated stroke risk, feel initiation of anticoagulation should be delayed given distended abdomen, severe colitis 2/2 C.diff, and possible upcoming surgical intervention.  - Would start eliquis 5mg  BID once it is clear there are no plans for surgery and colitis is improving.   2. CAD s/p remote stenting to RCA in 1997: no anginal complaints.  - Continue aspirin and statin - Continue BBlocker  3. HTN: BP consistently above goal this admission. Home lisinopril held due to mild AKI and he was continued on metoprolol succinate with dose increaed to 50mg  daily.  - Will transition to metoprolol tartrate as above - Favor allowing some permissive HTN in the setting of sepsis. Continue to monitor closely  4. Sepsis in the setting of C.diff colitis and possible cholecystitis: on IV flagyl and po vancomycin. Surgery following - abdomen distended today. No plans for cholecystectomy this admission - delayed to allow recovery from C.diff.  - Continue management per primary team and surgery  Risk Assessment/Risk Scores:  { CHA2DS2-VASc  Score = 4  This indicates a 4.8% annual risk of stroke. The patient's score is based upon: CHF History: No HTN History: Yes Diabetes History: No Stroke History: No Vascular Disease History: Yes Age Score: 2 Gender Score: 0      For questions or updates, please contact [place] Please consult [email or web address] for contact info under    Signed, Abigail Butts, PA-C  12/30/2020 10:19 AM

## 2020-12-30 NOTE — Progress Notes (Signed)
Patient ID: Leon Estes, male   DOB: 01/14/1931, 87 y.o.   MRN: [id number]  PROGRESS NOTE    Leon Estes  IOX:[id number] DOB: [birth year] DOA: 12/27/2020 PCP: Venia Carbon, MD   Brief Narrative:  87 year old male with history of CAD status post stenting to RCA in 1997, COPD, CKD stage III, hypertension, hyperlipidemia, BPH, recent admission at [facility] from 11/26/2020-11/29/2020 with presumed acute cholecystitis (RUQ ultrasound and CT abdomen/pelvis were equivocal.  HIDA scan was consistent with cystic duct obstruction) treated medically with antibiotics and he was followed up by general surgery as an outpatient with plans for laparoscopic cholecystectomy on 01/10/2021.  He presented on 12/27/2020 with fever and generalized weakness.  On presentation, temperature was 103.1 F rectally along with tachypnea and tachycardia, WBC of 15.3, creatinine of 1.48, normal LFTs, lactic acid of 2.8 and then 3.2 and BNP of 689.3.  COVID-19 and influenza tests were negative.  Chest x-ray was negative for focal consolidation. CT abdomen/pelvis without contrast showed prostatomegaly, stable appearance of the gallbladder with mild stable central intrahepatic biliary dilatation, and extensive chronic and degenerative changes within the lower lumbar spine.  He was started on IV fluids and antibiotics.  General surgery was consulted.  Initially plan was for surgical intervention but it was postponed once stool tested positive for C. difficile and patient was started on oral vancomycin.  Assessment & Plan:   Severe sepsis, present on admission  [name] Leukocytosis: Resolved ?  Acute on chronic cholecystitis -Patient was recently hospitalized at [facility] with presumed cholecystitis treated with antibiotics with outpatient follow-up with general surgery with plans for laparoscopic cholecystectomy on 01/10/2021. -Presented with temperature of 102.1 F rectally along with tachypnea, tachycardia, WBC of 15.3,  elevated lactic acid.  No clear-cut source of infection with negative chest x-ray. COVID-19 and influenza tests were negative.  - CT abdomen/pelvis showed stable appearance of the gallbladder with mild stable central intrahepatic biliary dilatation.  Right upper quadrant ultrasound shows cholelithiasis without acute cholecystitis -WBCs have normalized.  Labs pending for today. -General surgery following and initially were planning for surgical intervention on 12/28/2020 but surgery was postponed because of diarrhea and stool was tested.  Stool is positive for C. difficile.  Patient has been started on oral vancomycin.  After communicating with general surgery, cefepime and Flagyl were discontinued on 12/29/2020. -Continue n.p.o. for now.  IV fluids plan as below. -will check chest x-ray and abdominal x-ray  Paroxysmal A. fib with RVR -Not on anticoagulation as an outpatient.  Heart rate intermittently goes up to the 140s 150s.  Increase Toprol-XL as below.  Use as needed IV metoprolol.  Cardiology consult. -DC IV fluids.  Delirium/confusion -Nursing staff reports increasing confusion overnight and early this morning with some agitation.  Received IV Ativan and oral Dilaudid earlier this morning.  Hold further benzodiazepines and Dilaudid. -As needed Haldol for severe agitation.  If that does not work, might need restraints.  Check vitamin Q68, folic acid and TSH levels in a.m.  CAD status post PCI -Currently stable.  No chest pain.  Continue aspirin, metoprolol, simvastatin.  Outpatient follow-up with cardiology  COPD Acute hypoxic respiratory failure -Currently wheezing.  Give duo nebs.  Continue Symbicort.  DC IV fluids.  Give 1 dose of IV Lasix.  Chest x-ray.  Will give 1 dose of IV Solu-Medrol 125 mg as well. -Currently still on 3 L oxygen by nasal cannula.  Wean off as able.  Hypokalemia -Labs pending for today.  Chronic kidney  disease stage IIIa -Creatinine stable.   Monitor  Hypertension: -Blood pressure on the high side.  Increase Toprol-XL to 50 mg daily  Hyperlipidemia: -Continue simvastatin.  BPH: -Continue tamsulosin.  Generalized deconditioning -will need PT eval once more stable.    DVT prophylaxis: Subcutaneous heparin Code Status: DNR Family Communication: Spoke to wife on the phone on 12/29/2020  Disposition Plan: Status is: Inpatient  Remains inpatient appropriate because:Inpatient level of care appropriate due to severity of illness   Dispo: The patient is from: Home              Anticipated d/c is to: Home              Patient currently is not medically stable to d/c.   Difficult to place patient No  Consultants: General surgery  Procedures: None  Antimicrobials:  Anti-infectives (From admission, onward)   Start     Dose/Rate Route Frequency Ordered Stop   12/29/20 0600  vancomycin (VANCOREADY) IVPB 1250 mg/250 mL  Status:  Discontinued        1,250 mg 166.7 mL/hr over 90 Minutes Intravenous Every 36 hours 12/27/20 2315 12/28/20 1007   12/28/20 2000  vancomycin (VANCOCIN) 50 mg/mL oral solution 125 mg        125 mg Oral Every 6 hours 12/28/20 1801     12/28/20 0830  clindamycin (CLEOCIN) IVPB 900 mg  Status:  Discontinued       "And" Linked Group Details   900 mg 100 mL/hr over 30 Minutes Intravenous On call to O.R. 12/28/20 [id number] 12/29/20 0559   12/28/20 0830  gentamicin (GARAMYCIN) 360 mg in dextrose 5 % 100 mL IVPB  Status:  Discontinued       "And" Linked Group Details   5 mg/kg  71.2 kg 109 mL/hr over 60 Minutes Intravenous On call to O.R. 12/28/20 [id number] 12/29/20 0559   12/28/20 0600  ceFEPIme (MAXIPIME) 2 g in sodium chloride 0.9 % 100 mL IVPB  Status:  Discontinued        2 g 200 mL/hr over 30 Minutes Intravenous Every 12 hours 12/27/20 2315 12/29/20 0955   12/28/20 0200  metroNIDAZOLE (FLAGYL) IVPB 500 mg  Status:  Discontinued        500 mg 100 mL/hr over 60 Minutes Intravenous Every 8 hours 12/27/20  2202 12/29/20 0955   12/27/20 1830  aztreonam (AZACTAM) 2 g in sodium chloride 0.9 % 100 mL IVPB  Status:  Discontinued        2 g 200 mL/hr over 30 Minutes Intravenous  Once 12/27/20 1820 12/27/20 1823   12/27/20 1830  metroNIDAZOLE (FLAGYL) IVPB 500 mg        500 mg 100 mL/hr over 60 Minutes Intravenous  Once 12/27/20 1820 12/27/20 1935   12/27/20 1830  vancomycin (VANCOCIN) IVPB 1000 mg/200 mL premix        1,000 mg 200 mL/hr over 60 Minutes Intravenous  Once 12/27/20 1820 12/27/20 1934   12/27/20 1830  ceFEPIme (MAXIPIME) 2 g in sodium chloride 0.9 % 100 mL IVPB        2 g 200 mL/hr over 30 Minutes Intravenous  Once 12/27/20 1824 12/27/20 1903       Subjective: Patient seen and examined at bedside.  Extremely poor historian.  Nursing staff reports intermittent confusion overnight with tachycardia.  No overnight fever or vomiting reported.  Still having diarrhea. Objective: Vitals:   12/30/20 0500 12/30/20 0612 12/30/20 0718 12/30/20 0733  BP:  (!) 151/67 Marland Kitchen)  157/90 (!) 153/93  Pulse:  92 (!) 127 (!) 124  Resp:  20 20   Temp:  97.8 F (36.6 C) 98 F (36.7 C)   TempSrc:  Oral Axillary   SpO2:  100% 100%   Weight: 77.7 kg     Height:        Intake/Output Summary (Last 24 hours) at 12/30/2020 0748 Last data filed at 12/30/2020 0600 Gross per 24 hour  Intake 1597.83 ml  Output 1350 ml  Net 247.83 ml   Filed Weights   12/27/20 1721 12/29/20 0336 12/30/20 0500  Weight: 71.2 kg 76.1 kg 77.7 kg    Examination:  General exam: Looks chronically ill.  No distress.  Still on 3 L oxygen by nasal cannula.  Elderly male lying in bed. Respiratory system: Bilateral decreased breath sounds at bases with some crackles cardiovascular system: S1-S2 heard, tachycardic  gastrointestinal system: Abdomen is mildly distended, soft and mildly tender diffusely.  Bowel sounds sluggish. Extremities: No cyanosis; trace lower extremity edema present Central nervous system: Drowsy, wakes up only  very slightly, hardly participates in any conversation.  No focal neurological deficits.  Moving extremities Skin: No obvious petechiae/rashes Psychiatry: Could not be assessed because of mental status    Data Reviewed: I have personally reviewed following labs and imaging studies  CBC: Recent Labs  Lab 12/27/20 1752 12/28/20 0514 12/29/20 0256  WBC 15.3* 7.8 7.5  NEUTROABS 11.2*  --  6.7  HGB 12.9* 10.5* 11.1*  HCT 42.1 34.7* 36.0*  MCV 93.1 94.6 94.0  PLT 238 174 [id number]   Basic Metabolic Panel: Recent Labs  Lab 12/27/20 1752 12/28/20 0514 12/29/20 0256  NA 139 139 141  K 4.1 3.2* 4.1  CL 106 110 116*  CO2 24 19* 19*  GLUCOSE 133* 115* 130*  BUN 22 26* 37*  CREATININE 1.48* 1.40* 1.34*  CALCIUM 8.7* 7.9* 7.8*  MG  --   --  2.1   GFR: Estimated Creatinine Clearance: 36.3 mL/min (A) (by C-G formula based on SCr of 1.34 mg/dL (H)). Liver Function Tests: Recent Labs  Lab 12/27/20 1752 12/28/20 0514 12/29/20 0256  AST 31 31 50*  ALT 19 17 22   ALKPHOS 66 45 43  BILITOT 0.7 0.5 0.4  PROT 7.6 5.3* 5.6*  ALBUMIN 4.0 2.7* 2.8*   Recent Labs  Lab 12/27/20 1752  LIPASE 22   No results for input(s): AMMONIA in the last 168 hours. Coagulation Profile: Recent Labs  Lab 12/27/20 1752  INR 1.3*   Cardiac Enzymes: No results for input(s): CKTOTAL, CKMB, CKMBINDEX, TROPONINI in the last 168 hours. BNP (last 3 results) No results for input(s): PROBNP in the last 8760 hours. HbA1C: No results for input(s): HGBA1C in the last 72 hours. CBG: No results for input(s): GLUCAP in the last 168 hours. Lipid Profile: No results for input(s): CHOL, HDL, LDLCALC, TRIG, CHOLHDL, LDLDIRECT in the last 72 hours. Thyroid Function Tests: No results for input(s): TSH, T4TOTAL, FREET4, T3FREE, THYROIDAB in the last 72 hours. Anemia Panel: No results for input(s): VITAMINB12, FOLATE, FERRITIN, TIBC, IRON, RETICCTPCT in the last 72 hours. Sepsis Labs: Recent Labs  Lab  12/27/20 1752 12/27/20 1952 12/27/20 2132 12/28/20 0514  PROCALCITON  --   --   --  29.44  LATICACIDVEN 3.2* 2.8* 2.5*  --     Recent Results (from the past 240 hour(s))  Blood Culture (routine x 2)     Status: None (Preliminary result)   Collection Time: 12/27/20  5:52 PM  Specimen: BLOOD  Result Value Ref Range Status   Specimen Description   Final    BLOOD SITE NOT SPECIFIED Performed at [facility] [address] [place], [place] [address]    Special Requests   Final    BOTTLES DRAWN AEROBIC AND ANAEROBIC Blood Culture adequate volume Performed at [facility] [address], [place], [place] [address]    Culture   Final    NO GROWTH 3 DAYS Performed at [facility], [place] [address], [place], [place] [address]    Report Status PENDING  Incomplete  Blood Culture (routine x 2)     Status: None (Preliminary result)   Collection Time: 12/27/20  6:19 PM   Specimen: BLOOD  Result Value Ref Range Status   Specimen Description   Final    BLOOD LEFT ANTECUBITAL Performed at [facility] [address] [place], [place] [address]    Special Requests   Final    BOTTLES DRAWN AEROBIC AND ANAEROBIC Blood Culture results may not be optimal due to an inadequate volume of blood received in culture bottles Performed at [facility] [address] [place], [place] [address]    Culture   Final    NO GROWTH 3 DAYS Performed at [facility], [place] [address] [place],  [address]    Report Status PENDING  Incomplete  Resp Panel by RT-PCR (Flu A&B, Covid) Nasopharyngeal Swab     Status: None   Collection Time: 12/27/20  6:21 PM   Specimen: Nasopharyngeal Swab; Nasopharyngeal(NP) swabs in vial transport medium  Result Value Ref Range Status   SARS Coronavirus 2 by RT PCR NEGATIVE NEGATIVE Final    Comment: (NOTE) SARS-CoV-2 target nucleic acids are NOT DETECTED.  The SARS-CoV-2 RNA is generally  detectable in upper respiratory specimens during the acute phase of infection. The lowest concentration of SARS-CoV-2 viral copies this assay can detect is 138 copies/mL. A negative result does not preclude SARS-Cov-2 infection and should not be used as the sole basis for treatment or other patient management decisions. A negative result may occur with  improper specimen collection/handling, submission of specimen other than nasopharyngeal swab, presence of viral mutation(s) within the areas targeted by this assay, and inadequate number of viral copies(<138 copies/mL). A negative result must be combined with clinical observations, patient history, and epidemiological information. The expected result is Negative.  Fact Sheet for Patients:  EntrepreneurPulse.com.au  Fact Sheet for Healthcare Providers:  IncredibleEmployment.be  This test is no t yet approved or cleared by the [place] FDA and  has been authorized for detection and/or diagnosis of SARS-CoV-2 by FDA under an Emergency Use Authorization (EUA). This EUA will remain  in effect (meaning this test can be used) for the duration of the COVID-19 declaration under Section 564(b)(1) of the Act, 21 U.S.C.section 360bbb-3(b)(1), unless the authorization is terminated  or revoked sooner.       Influenza A by PCR NEGATIVE NEGATIVE Final   Influenza B by PCR NEGATIVE NEGATIVE Final    Comment: (NOTE) The Xpert Xpress SARS-CoV-2/FLU/RSV plus assay is intended as an aid in the diagnosis of influenza from Nasopharyngeal swab specimens and should not be used as a sole basis for treatment. Nasal washings and aspirates are unacceptable for Xpert Xpress SARS-CoV-2/FLU/RSV testing.  Fact Sheet for Patients: EntrepreneurPulse.com.au  Fact Sheet for Healthcare Providers: IncredibleEmployment.be  This test is not yet approved or cleared by the [place] FDA  and has been authorized for detection and/or diagnosis of SARS-CoV-2 by FDA under an Emergency Use Authorization (EUA). This EUA will remain in effect (meaning this test can be used) for the duration of the COVID-19 declaration under Section 564(b)(1) of the Act, 21 U.S.C. section 360bbb-3(b)(1), unless the authorization is terminated or revoked.  Performed at [facility], [place] [address] [place], [place] [address]   Urine culture     Status: Abnormal   Collection Time: 12/27/20 10:14 PM   Specimen: In/Out Cath Urine  Result Value Ref Range Status   Specimen Description   Final    IN/OUT CATH URINE Performed at [facility] [address] [place], [place] [address]    Special Requests   Final    NONE Performed at [facility], [place] [address] [place], [place] [address]    Culture MULTIPLE SPECIES PRESENT, SUGGEST RECOLLECTION (A)  Final   Report Status 12/29/2020 FINAL  Final  MRSA PCR Screening     Status: Abnormal   Collection Time: 12/28/20  8:46 AM   Specimen: Nasal Mucosa; Nasopharyngeal  Result Value Ref Range Status   MRSA by PCR POSITIVE (A) NEGATIVE Final    Comment:        The GeneXpert MRSA Assay (FDA approved for NASAL specimens only), is one component of a comprehensive MRSA colonization surveillance program. It is not intended to diagnose MRSA infection nor to guide or monitor treatment for MRSA infections. RESULT CALLED TO, READ BACK BY AND VERIFIED WITH: Leida Lauth RN AT 1112 12/28/20 MULLINS,T Performed at [facility], [place] [address] [place], [place] [address]   C Difficile Quick Screen w PCR reflex     Status: Abnormal   Collection Time: 12/28/20 11:43 AM   Specimen: STOOL  Result Value Ref Range Status   C Diff antigen POSITIVE (A) NEGATIVE Final   C Diff toxin POSITIVE (A) NEGATIVE Final   C Diff interpretation Toxin producing C. difficile detected.  Final     Comment: CRITICAL RESULT CALLED TO, READ BACK BY AND VERIFIED WITH: L.CAUDLE, RN AT 1757 ON 05.18.22 BY N.THOMPSON Performed at [facility] [address] [place], [place] [address]   Gastrointestinal Panel by PCR , Stool     Status: None   Collection Time: 12/28/20 11:43 AM   Specimen: STOOL  Result Value Ref Range Status   Campylobacter species NOT DETECTED NOT DETECTED Final   Plesimonas shigelloides NOT DETECTED NOT DETECTED Final   Salmonella species NOT DETECTED NOT DETECTED Final   Yersinia enterocolitica NOT DETECTED NOT DETECTED Final   Vibrio species NOT DETECTED NOT DETECTED Final   Vibrio cholerae NOT DETECTED NOT DETECTED Final   Enteroaggregative E coli (EAEC) NOT DETECTED NOT DETECTED Final   Enteropathogenic E coli (EPEC) NOT DETECTED NOT DETECTED Final   Enterotoxigenic E coli (ETEC) NOT DETECTED NOT DETECTED Final   Shiga like toxin producing E coli (STEC) NOT DETECTED NOT DETECTED Final   Shigella/Enteroinvasive E coli (EIEC) NOT DETECTED NOT DETECTED Final   Cryptosporidium NOT DETECTED NOT DETECTED Final   Cyclospora cayetanensis NOT DETECTED NOT DETECTED Final   Entamoeba histolytica NOT DETECTED NOT DETECTED Final   Giardia lamblia NOT DETECTED NOT DETECTED Final   Adenovirus F40/41 NOT DETECTED NOT DETECTED Final   Astrovirus NOT DETECTED NOT DETECTED Final   Norovirus GI/GII NOT DETECTED NOT DETECTED Final   Rotavirus A NOT DETECTED NOT DETECTED Final   Sapovirus (I, II, IV,  and V) NOT DETECTED NOT DETECTED Final    Comment: Performed at [facility], [address] [place], [place] [address]         Radiology Studies: No results found.      Scheduled Meds: . aspirin EC  81 mg Oral Daily  . bupivacaine liposome  20 mL Infiltration Once  . Chlorhexidine Gluconate Cloth  6 each Topical Daily  . heparin  5,000 Units Subcutaneous Q8H  . mouth rinse  15 mL Mouth Rinse BID  . metoprolol succinate  50 mg Oral Daily  .  mometasone-formoterol  2 puff Inhalation BID  . mupirocin ointment  1 application Nasal BID  . pantoprazole  40 mg Oral Daily  . simvastatin  40 mg Oral QPM  . sodium chloride flush  3 mL Intravenous Q12H  . tamsulosin  0.4 mg Oral Daily  . vancomycin  125 mg Oral Q6H   Continuous Infusions: . 0.9 % NaCl with KCl 40 mEq / L 75 mL/hr at 12/29/20 1545          Kevon Tench Starla Link, MD [facility] 12/30/2020, 7:48 AM

## 2020-12-30 NOTE — Plan of Care (Signed)
  Problem: Safety: Goal: Ability to remain free from injury will improve Outcome: Progressing   Problem: Skin Integrity: Goal: Risk for impaired skin integrity will decrease Outcome: Progressing   Problem: Coping: Goal: Level of anxiety will decrease Outcome: Progressing   

## 2020-12-30 NOTE — Progress Notes (Signed)
  Echocardiogram 2D Echocardiogram has been performed.  Leon Estes 12/30/2020, 1:46 PM

## 2020-12-30 NOTE — Significant Event (Signed)
Rapid Response Event Note   Reason for Call : tachycardia w/ irregular heart rate   Initial Focused Assessment: upon assessment patient in slouched at the bottom on the bed, confused, oriented only to self, audible wheezing heard from the door. Pts HR in the 120's. On 3L [place]. Temp 98 F; BP 157-90; HR 127; 20 RR; 100% on 3L [place].    Interventions: chest xray ordered, CMP, CBC, Mag, and ammonia ordered. IV fluids discontinued, 60 mg lasix administered. Duo-neb administered.    Plan of Care: patient stable at this time. Instructed nursing staff to call back if BP dropped or if there was poor UOP with lasix administration.     Event Summary:   MD NotifiedStarla Link Call Time: [phone] Arrival Time: 0800 End Time: [place], RN

## 2020-12-30 NOTE — Care Management Important Message (Signed)
Important Message  Patient Details IM Letter given to the Patient. Name: SLATE DEBROUX MRN: [id number] Date of Birth: Jan 10, 1931   Medicare Important Message Given:  Yes     Kerin Salen 12/30/2020, 9:23 AM

## 2020-12-30 NOTE — Progress Notes (Signed)
   12/30/20 0718  Assess: MEWS Score  Temp 98 F (36.7 C)  BP (!) 157/90  Pulse Rate (!) 127  ECG Heart Rate (!) 140  Resp 20  Level of Consciousness Alert  SpO2 100 %  O2 Device Nasal Cannula  O2 Flow Rate (L/min) 3 L/min  Assess: MEWS Score  MEWS Temp 0  MEWS Systolic 0  MEWS Pulse 3  MEWS RR 0  MEWS LOC 0  MEWS Score 3  MEWS Score Color Yellow  Assess: if the MEWS score is Yellow or Red  Were vital signs taken at a resting state? Yes  Focused Assessment Change from prior assessment (see assessment flowsheet)  Does the patient meet 2 or more of the SIRS criteria? No  MEWS guidelines implemented *See Row Information* Yes  Treat  Pain Scale 0-10  Pain Score 0  Take Vital Signs  Increase Vital Sign Frequency  Yellow: Q 2hr X 2 then Q 4hr X 2, if remains yellow, continue Q 4hrs  Escalate  MEWS: Escalate Yellow: discuss with charge nurse/RN and consider discussing with provider and RRT  Notify: Charge Nurse/RN  Name of Charge Nurse/RN Notified Kasia, RN  Date Charge Nurse/RN Notified 12/30/20  Time Charge Nurse/RN Notified 0720  Notify: Provider  Provider Name/Title MD Aline August  Date Provider Notified 12/30/20  Time Provider Notified 0730  Notification Type Page  Notification Reason Change in status  Provider response En route  Date of Provider Response 12/29/20  Time of Provider Response 0750  Notify: Rapid Response  Date Rapid Response Notified 12/30/20  Time Rapid Response Notified 0740  Document  Patient Outcome Not stable and remains on department  Progress note created (see row info) Yes  Assess: SIRS CRITERIA  SIRS Temperature  0  SIRS Pulse 1  SIRS Respirations  0  SIRS WBC 0  SIRS Score Sum  1

## 2020-12-31 ENCOUNTER — Inpatient Hospital Stay (HOSPITAL_COMMUNITY): Payer: PPO

## 2020-12-31 DIAGNOSIS — R0603 Acute respiratory distress: Secondary | ICD-10-CM

## 2020-12-31 DIAGNOSIS — K812 Acute cholecystitis with chronic cholecystitis: Secondary | ICD-10-CM | POA: Diagnosis not present

## 2020-12-31 DIAGNOSIS — Z7189 Other specified counseling: Secondary | ICD-10-CM | POA: Diagnosis not present

## 2020-12-31 DIAGNOSIS — Z515 Encounter for palliative care: Secondary | ICD-10-CM

## 2020-12-31 DIAGNOSIS — R410 Disorientation, unspecified: Secondary | ICD-10-CM | POA: Diagnosis not present

## 2020-12-31 DIAGNOSIS — A0472 Enterocolitis due to Clostridium difficile, not specified as recurrent: Secondary | ICD-10-CM

## 2020-12-31 DIAGNOSIS — A419 Sepsis, unspecified organism: Secondary | ICD-10-CM | POA: Diagnosis not present

## 2020-12-31 DIAGNOSIS — R6520 Severe sepsis without septic shock: Secondary | ICD-10-CM | POA: Diagnosis not present

## 2020-12-31 DIAGNOSIS — N1831 Chronic kidney disease, stage 3a: Secondary | ICD-10-CM | POA: Diagnosis not present

## 2020-12-31 LAB — COMPREHENSIVE METABOLIC PANEL
ALT: 38 U/L (ref 0–44)
AST: 86 U/L — ABNORMAL HIGH (ref 15–41)
Albumin: 3.2 g/dL — ABNORMAL LOW (ref 3.5–5.0)
Alkaline Phosphatase: 62 U/L (ref 38–126)
Anion gap: 16 — ABNORMAL HIGH (ref 5–15)
BUN: 51 mg/dL — ABNORMAL HIGH (ref 8–23)
CO2: 15 mmol/L — ABNORMAL LOW (ref 22–32)
Calcium: 9 mg/dL (ref 8.9–10.3)
Chloride: 117 mmol/L — ABNORMAL HIGH (ref 98–111)
Creatinine, Ser: 1.47 mg/dL — ABNORMAL HIGH (ref 0.61–1.24)
GFR, Estimated: 46 mL/min — ABNORMAL LOW (ref >60)
Glucose, Bld: 125 mg/dL — ABNORMAL HIGH (ref 70–99)
Potassium: 3.8 mmol/L (ref 3.5–5.1)
Sodium: 148 mmol/L — ABNORMAL HIGH (ref 135–145)
Total Bilirubin: 0.9 mg/dL (ref 0.3–1.2)
Total Protein: 6.6 g/dL (ref 6.5–8.1)

## 2020-12-31 LAB — CBC WITH DIFFERENTIAL/PLATELET
Abs Immature Granulocytes: NONE SEEN 10*3/uL (ref 0.00–0.07)
Band Neutrophils: 11 %
Basophils Absolute: 0 10*3/uL (ref 0.0–0.1)
Basophils Relative: 0 %
Blasts: 1 %
Eosinophils Absolute: 0 10*3/uL (ref 0.0–0.5)
Eosinophils Relative: 0 %
HCT: 33 % — ABNORMAL LOW (ref 39.0–52.0)
Hemoglobin: 10.1 g/dL — ABNORMAL LOW (ref 13.0–17.0)
Immature Granulocytes: NONE SEEN %
Lymphocytes Relative: 3 %
Lymphs Abs: 0.8 10*3/uL (ref 0.7–4.0)
MCH: 28.1 pg (ref 26.0–34.0)
MCHC: 30.6 g/dL (ref 30.0–36.0)
MCV: 91.7 fL (ref 80.0–100.0)
Metamyelocytes Relative: NONE SEEN %
Monocytes Absolute: 0.8 10*3/uL (ref 0.1–1.0)
Monocytes Relative: 4 %
Myelocytes: NONE SEEN %
Neutro Abs: 10.3 10*3/uL — ABNORMAL HIGH (ref 1.7–7.7)
Neutrophils Relative %: 82 %
Platelets: 232 10*3/uL (ref 150–400)
Promyelocytes Relative: NONE SEEN %
RBC Morphology: NORMAL
RBC: 3.6 MIL/uL — ABNORMAL LOW (ref 4.22–5.81)
RDW: 14.6 % (ref 11.5–15.5)
WBC Morphology: NORMAL
WBC: 12 10*3/uL — ABNORMAL HIGH (ref 4.0–10.5)
nRBC: 0 % (ref 0.0–0.2)
nRBC: NONE SEEN /100 WBC

## 2020-12-31 LAB — VITAMIN B12: Vitamin B-12: 756 pg/mL (ref 180–914)

## 2020-12-31 LAB — TSH: TSH: 0.442 u[IU]/mL (ref 0.350–4.500)

## 2020-12-31 LAB — FOLATE: Folate: 16.6 ng/mL (ref >5.9)

## 2020-12-31 LAB — MAGNESIUM: Magnesium: 2.4 mg/dL (ref 1.7–2.4)

## 2020-12-31 MED ORDER — BUDESONIDE 0.5 MG/2ML IN SUSP
0.5000 mg | Freq: Two times a day (BID) | RESPIRATORY_TRACT | Status: DC
  Administered 2020-12-31 – 2021-01-23 (×46): 0.5 mg via RESPIRATORY_TRACT
  Filled 2020-12-31 (×47): qty 2

## 2020-12-31 MED ORDER — ALBUTEROL SULFATE (2.5 MG/3ML) 0.083% IN NEBU
2.5000 mg | INHALATION_SOLUTION | RESPIRATORY_TRACT | Status: DC | PRN
  Administered 2021-01-02 – 2021-01-20 (×5): 2.5 mg via RESPIRATORY_TRACT
  Filled 2020-12-31 (×5): qty 3

## 2020-12-31 MED ORDER — METHYLPREDNISOLONE SODIUM SUCC 125 MG IJ SOLR
60.0000 mg | Freq: Four times a day (QID) | INTRAMUSCULAR | Status: DC
  Administered 2020-12-31 – 2021-01-01 (×4): 60 mg via INTRAVENOUS
  Filled 2020-12-31 (×4): qty 2

## 2020-12-31 MED ORDER — IPRATROPIUM-ALBUTEROL 0.5-2.5 (3) MG/3ML IN SOLN
3.0000 mL | Freq: Four times a day (QID) | RESPIRATORY_TRACT | Status: DC
  Administered 2020-12-31 – 2021-01-01 (×3): 3 mL via RESPIRATORY_TRACT
  Filled 2020-12-31 (×5): qty 3

## 2020-12-31 MED ORDER — DEXTROSE 5 % IV SOLN: INTRAVENOUS | Status: DC

## 2020-12-31 NOTE — Plan of Care (Signed)
  Problem: Safety: Goal: Non-violent Restraint(s) Outcome: Progressing   Problem: Pain Managment: Goal: General experience of comfort will improve Outcome: Progressing   Problem: Elimination: Goal: Will not experience complications related to bowel motility Outcome: Progressing

## 2020-12-31 NOTE — Progress Notes (Signed)
Leon Estes [id number] [birth year]  CARE TEAM:  PCP: Venia Carbon, MD  Outpatient Care Team: Patient Care Team: Venia Carbon, MD as PCP - General (Internal Medicine) Nahser, Wonda Cheng, MD as PCP - Cardiology (Cardiology) Seward Carol, MD as Consulting Physician (Internal Medicine)  Inpatient Treatment Team: Treatment Team: Attending Provider: Aline August, MD; Rounding Team: Ian Bushman, MD; Consulting Physician: Nolon Nations, MD; Rounding Team: Lbcardiology, Michae Kava, MD; Social Worker: Merri Brunette; Utilization Review: Alease Medina, RN; Registered Nurse: Dorene Sorrow, RN; Technician: Resa Miner, NT; Pharmacist: Eudelia Bunch, [facility]; Social Worker: [place], [place],    Problem List:   Principal Problem:   Colitis due to Clostridioides difficile Active Problems:   CAD (coronary artery disease)   Benign essential HTN   COPD (chronic obstructive pulmonary disease) (HCC)   Chronic renal disease, stage III (HCC)   BPH (benign prostatic hyperplasia)   Severe sepsis (HCC)   HOH (hard of hearing)   Acute on chronic cholecystitis   Hypokalemia      Assessment  C DIFF COLITIS  Probable chronic cholecystitis and shrunken atrophic gallbladder.  Not source of any sepsis or decline  [facility] Stay = 4 days)  Assessment/Plan CAD status post stenting to RCA in 1997 COPD CKD stage III- Cr 1.34  Hypertension Hyperlipidemia BPH  Cholelithiasis Chronic cholecystitis- follow up with Dr. Redmond Pulling as outpatient as C diff source of infection this admit.  C. Diff colitis  Patient without any concern for peritonitis - no pain in abdomen    Patient has extremely shrunken atrophic gallbladder at this point with no hard evidence of cholecystitis by exam or x-ray studies.  Not even amenable to percutaneous drainage as it is very atrophic..  Would postpone gallbladder surgery until he gets over his C. difficile.  Original surgeon, Dr.  Redmond Pulling, agrees.  Operative risks markedly increased at this moment given his [name] and other health issues  Most likely has colonic ileus from his [name].  No hard indications for toxic megacolon or decline.  X-rays show colonic distention but no free air/perforation.  Agree with plan of treating C. difficile aggressively with multiorgan system evaluation/support.    Can consider advancing diet.  Start with clear liquids and may be advance to dysphagia 1.  I think it would be wise for speech therapy to evaluate to rule out any dysphagia issues and follow mental status /cognitive issues.  FEN: try clears, IVF VTE: SQ heparin  ID: vanc 5/17; Cefepime/flagyl 5/17>5/19, gent/clinda 5/18; PO VANC 5/19>>  The patient is stable.  There is no evidence of peritonitis, acute abdomen, nor shock.  There is no strong evidence of failure of improvement nor decline with current non-operative management.  There is no need for surgery at the present moment.  General surgery service will follow more peripherally and reevaluate on Monday.  Call for more urgent issues.        20 minutes spent in review, evaluation, examination, counseling, and coordination of care.   I have reviewed this patient's available data, including medical history, events of note, physical examination and test results as part of my evaluation.  A significant portion of that time was spent in counseling.  Care during the described time interval was provided by me.  12/31/2020    Subjective: (Chief complaint)  No major hemodynamic events.  Patient denies abdominal pain.  Agitated and confused.  Needing restraints and supervision  Objective:  Vital signs:  Vitals:   12/31/20 0343 12/31/20 0400 12/31/20 0430 12/31/20 0653  BP:  (!) 176/95    Pulse:  (!) 102  100  Resp:  (!) 23    Temp:   97.6 F (36.4 C)   TempSrc:   Oral   SpO2:  99%    Weight: 74.5 kg     Height:        Last BM Date:  12/30/20  Intake/Output   Yesterday:  05/20 0701 - 05/21 0700 In: 103 [I.V.:3; IV Piggyback:100] Out: 2200 [Urine:2200] This shift:  No intake/output data recorded.  Bowel function:  Flatus: ?  BM:  YES  Drain: (No drain)   Physical Exam:  General: Pt awake/alert in moderate acute distress.  Initially restless and mumbling but will answer some simple questions and follow some simple commands.  Overall improved but still with Eyes: PERRL, normal EOM.  Sclera clear.  No icterus Neuro: CN II-XII intact w/o focal sensory/motor deficits. Lymph: No head/neck/groin lymphadenopathy Psych:  +delerium.  Oriented x 1 HENT: Normocephalic, Mucus membranes moist.  No thrush Neck: Supple, No tracheal deviation.  No obvious thyromegaly Chest: No pain to chest wall compression.  Good respiratory excursion.  No audible wheezing CV:  Pulses intact.  Regular rhythm.  No major extremity edema MS: Normal AROM mjr joints.  No obvious deformity  Abdomen: Soft.  Moderately distended.  Nontender.  No evidence of peritonitis.  No incarcerated hernias.  Ext:  No deformity.  No mjr edema.  No cyanosis Skin: No petechiae / purpurea.  No major sores.  Warm and dry    Results:   Cultures: Recent Results (from the past 720 hour(s))  Blood Culture (routine x 2)     Status: None (Preliminary result)   Collection Time: 12/27/20  5:52 PM   Specimen: BLOOD  Result Value Ref Range Status   Specimen Description   Final    BLOOD SITE NOT SPECIFIED Performed at [facility], [address], [place], [place] [address]    Special Requests   Final    BOTTLES DRAWN AEROBIC AND ANAEROBIC Blood Culture adequate volume Performed at [place] [address] [place], [place] [address]    Culture   Final    NO GROWTH 3 DAYS Performed at [facility], [place] [address] [place], [place] [address]    Report Status PENDING  Incomplete  Blood Culture (routine x 2)     Status: None  (Preliminary result)   Collection Time: 12/27/20  6:19 PM   Specimen: BLOOD  Result Value Ref Range Status   Specimen Description   Final    BLOOD LEFT ANTECUBITAL Performed at [place] [address], [place], [place] [address]    Special Requests   Final    BOTTLES DRAWN AEROBIC AND ANAEROBIC Blood Culture results may not be optimal due to an inadequate volume of blood received in culture bottles Performed at [facility] [address], [place], [place] [address]    Culture   Final    NO GROWTH 3 DAYS Performed at [facility], [place] [address], [place],  [address]    Report Status PENDING  Incomplete  Resp Panel by RT-PCR (Flu A&B, Covid) Nasopharyngeal Swab     Status: None   Collection Time: 12/27/20  6:21 PM   Specimen: Nasopharyngeal Swab; Nasopharyngeal(NP) swabs in vial transport medium  Result Value Ref Range Status   SARS Coronavirus 2  by RT PCR NEGATIVE NEGATIVE Final    Comment: (NOTE) SARS-CoV-2 target nucleic acids are NOT DETECTED.  The SARS-CoV-2 RNA is generally detectable in upper respiratory specimens during the acute phase of infection. The lowest concentration of SARS-CoV-2 viral copies this assay can detect is 138 copies/mL. A negative result does not preclude SARS-Cov-2 infection and should not be used as the sole basis for treatment or other patient management decisions. A negative result may occur with  improper specimen collection/handling, submission of specimen other than nasopharyngeal swab, presence of viral mutation(s) within the areas targeted by this assay, and inadequate number of viral copies(<138 copies/mL). A negative result must be combined with clinical observations, patient history, and epidemiological information. The expected result is Negative.  Fact Sheet for Patients:  EntrepreneurPulse.com.au  Fact Sheet for Healthcare Providers:   IncredibleEmployment.be  This test is no t yet approved or cleared by the [place] FDA and  has been authorized for detection and/or diagnosis of SARS-CoV-2 by FDA under an Emergency Use Authorization (EUA). This EUA will remain  in effect (meaning this test can be used) for the duration of the COVID-19 declaration under Section 564(b)(1) of the Act, 21 U.S.C.section 360bbb-3(b)(1), unless the authorization is terminated  or revoked sooner.       Influenza A by PCR NEGATIVE NEGATIVE Final   Influenza B by PCR NEGATIVE NEGATIVE Final    Comment: (NOTE) The Xpert Xpress SARS-CoV-2/FLU/RSV plus assay is intended as an aid in the diagnosis of influenza from Nasopharyngeal swab specimens and should not be used as a sole basis for treatment. Nasal washings and aspirates are unacceptable for Xpert Xpress SARS-CoV-2/FLU/RSV testing.  Fact Sheet for Patients: EntrepreneurPulse.com.au  Fact Sheet for Healthcare Providers: IncredibleEmployment.be  This test is not yet approved or cleared by the [place] FDA and has been authorized for detection and/or diagnosis of SARS-CoV-2 by FDA under an Emergency Use Authorization (EUA). This EUA will remain in effect (meaning this test can be used) for the duration of the COVID-19 declaration under Section 564(b)(1) of the Act, 21 U.S.C. section 360bbb-3(b)(1), unless the authorization is terminated or revoked.  Performed at [facility], [place] [address], [place], [place] [address]   Urine culture     Status: Abnormal   Collection Time: 12/27/20 10:14 PM   Specimen: In/Out Cath Urine  Result Value Ref Range Status   Specimen Description   Final    IN/OUT CATH URINE Performed at [facility] [address], [place], [place] [address]    Special Requests   Final    NONE Performed at [facility], [place] [address]  [place], [place] [address]    Culture MULTIPLE SPECIES PRESENT, SUGGEST RECOLLECTION (A)  Final   Report Status 12/29/2020 FINAL  Final  MRSA PCR Screening     Status: Abnormal   Collection Time: 12/28/20  8:46 AM   Specimen: Nasal Mucosa; Nasopharyngeal  Result Value Ref Range Status   MRSA by PCR POSITIVE (A) NEGATIVE Final    Comment:        The GeneXpert MRSA Assay (FDA approved for NASAL specimens only), is one component of a comprehensive MRSA colonization surveillance program. It is not intended to diagnose MRSA infection nor to guide or monitor treatment for MRSA infections. RESULT CALLED TO, READ BACK BY AND VERIFIED WITH: Leida Lauth RN AT 1112 12/28/20 MULLINS,T Performed at [facility], [place] [address] [place], [place] [address]   C Difficile Quick  Screen w PCR reflex     Status: Abnormal   Collection Time: 12/28/20 11:43 AM   Specimen: STOOL  Result Value Ref Range Status   C Diff antigen POSITIVE (A) NEGATIVE Final   C Diff toxin POSITIVE (A) NEGATIVE Final   C Diff interpretation Toxin producing C. difficile detected.  Final    Comment: CRITICAL RESULT CALLED TO, READ BACK BY AND VERIFIED WITH: L.CAUDLE, RN AT 1757 ON 05.18.22 BY N.THOMPSON Performed at [facility] [address], [place], [place] [address]   Gastrointestinal Panel by PCR , Stool     Status: None   Collection Time: 12/28/20 11:43 AM   Specimen: STOOL  Result Value Ref Range Status   Campylobacter species NOT DETECTED NOT DETECTED Final   Plesimonas shigelloides NOT DETECTED NOT DETECTED Final   Salmonella species NOT DETECTED NOT DETECTED Final   Yersinia enterocolitica NOT DETECTED NOT DETECTED Final   Vibrio species NOT DETECTED NOT DETECTED Final   Vibrio cholerae NOT DETECTED NOT DETECTED Final   Enteroaggregative E coli (EAEC) NOT DETECTED NOT DETECTED Final   Enteropathogenic E coli (EPEC) NOT DETECTED NOT DETECTED Final   Enterotoxigenic E coli  (ETEC) NOT DETECTED NOT DETECTED Final   Shiga like toxin producing E coli (STEC) NOT DETECTED NOT DETECTED Final   Shigella/Enteroinvasive E coli (EIEC) NOT DETECTED NOT DETECTED Final   Cryptosporidium NOT DETECTED NOT DETECTED Final   Cyclospora cayetanensis NOT DETECTED NOT DETECTED Final   Entamoeba histolytica NOT DETECTED NOT DETECTED Final   Giardia lamblia NOT DETECTED NOT DETECTED Final   Adenovirus F40/41 NOT DETECTED NOT DETECTED Final   Astrovirus NOT DETECTED NOT DETECTED Final   Norovirus GI/GII NOT DETECTED NOT DETECTED Final   Rotavirus A NOT DETECTED NOT DETECTED Final   Sapovirus (I, II, IV, and V) NOT DETECTED NOT DETECTED Final    Comment: Performed at [facility], [place] [place], [place] [address]    Labs: Results for orders placed or performed during the hospital encounter of 12/27/20 (from the past 48 hour(s))  Prealbumin     Status: Abnormal   Collection Time: 12/29/20 11:54 AM  Result Value Ref Range   Prealbumin 11.7 (L) 18 - 38 mg/dL    Comment: Performed at [facility], [place] [address], [place], [place] [address]  CBC with Differential/Platelet     Status: Abnormal   Collection Time: 12/30/20  7:51 AM  Result Value Ref Range   WBC 12.2 (H) 4.0 - 10.5 K/uL   RBC 3.94 (L) 4.22 - 5.81 MIL/uL   Hemoglobin 11.1 (L) 13.0 - 17.0 g/dL   HCT 37.0 (L) 39.0 - 52.0 %   MCV 93.9 80.0 - 100.0 fL   MCH 28.2 26.0 - 34.0 pg   MCHC 30.0 30.0 - 36.0 g/dL   RDW 14.6 11.5 - 15.5 %   Platelets 234 150 - 400 K/uL   nRBC 0.0 0.0 - 0.2 %   Neutrophils Relative % 75 %   Neutro Abs 9.2 (H) 1.7 - 7.7 K/uL   Lymphocytes Relative 10 %   Lymphs Abs 1.2 0.7 - 4.0 K/uL   Monocytes Relative 13 %   Monocytes Absolute 1.6 (H) 0.1 - 1.0 K/uL   Eosinophils Relative 0 %   Eosinophils Absolute 0.0 0.0 - 0.5 K/uL   Basophils Relative 1 %   Basophils Absolute 0.1 0.0 - 0.1 K/uL   WBC Morphology TOXIC GRANULATION    Immature Granulocytes 1 %  Abs Immature Granulocytes 0.08 (H) 0.00 - 0.07 K/uL   Abnormal Lymphocytes Present PRESENT    Dohle Bodies PRESENT     Comment: Performed at [facility], [place] [address], [place], [place] [address]  Comprehensive metabolic panel     Status: Abnormal   Collection Time: 12/30/20  7:51 AM  Result Value Ref Range   Sodium 143 135 - 145 mmol/L   Potassium 4.2 3.5 - 5.1 mmol/L   Chloride 116 (H) 98 - 111 mmol/L   CO2 18 (L) 22 - 32 mmol/L   Glucose, Bld 113 (H) 70 - 99 mg/dL    Comment: Glucose reference range applies only to samples taken after fasting for at least 8 hours.   BUN 35 (H) 8 - 23 mg/dL   Creatinine, Ser 1.24 0.61 - 1.24 mg/dL   Calcium 8.4 (L) 8.9 - 10.3 mg/dL   Total Protein 6.5 6.5 - 8.1 g/dL   Albumin 3.0 (L) 3.5 - 5.0 g/dL   AST 51 (H) 15 - 41 U/L   ALT 29 0 - 44 U/L   Alkaline Phosphatase 51 38 - 126 U/L   Total Bilirubin 0.7 0.3 - 1.2 mg/dL   GFR, Estimated 56 (L) >60 mL/min    Comment: (NOTE) Calculated using the CKD-EPI Creatinine Equation (2021)    Anion gap 9 5 - 15    Comment: Performed at [facility], [place] [address] [place], [place] [address]  Magnesium     Status: None   Collection Time: 12/30/20  7:51 AM  Result Value Ref Range   Magnesium 2.2 1.7 - 2.4 mg/dL    Comment: Performed at [facility], [place] [address] [place], [place] [address]  Ammonia     Status: None   Collection Time: 12/30/20  7:51 AM  Result Value Ref Range   Ammonia 14 9 - 35 umol/L    Comment: Performed at [facility], [place] [address], [place],  [address]  CBC with Differential/Platelet     Status: Abnormal   Collection Time: 12/31/20  2:04 AM  Result Value Ref Range   WBC 12.0 (H) 4.0 - 10.5 K/uL   RBC 3.60 (L) 4.22 - 5.81 MIL/uL   Hemoglobin 10.1 (L) 13.0 - 17.0 g/dL   HCT 33.0 (L) 39.0 - 52.0 %   MCV 91.7 80.0 - 100.0 fL   MCH 28.1 26.0 - 34.0 pg   MCHC 30.6 30.0 - 36.0 g/dL   RDW 14.6 11.5  - 15.5 %   Platelets 232 150 - 400 K/uL   nRBC 0.0 0.0 - 0.2 %   Neutrophils Relative % 82 %   Neutro Abs 10.3 (H) 1.7 - 7.7 K/uL   Band Neutrophils 11 %   Lymphocytes Relative 3 %   Lymphs Abs 0.8 0.7 - 4.0 K/uL   Monocytes Relative 4 %   Monocytes Absolute 0.8 0.1 - 1.0 K/uL   Eosinophils Relative 0 %   Eosinophils Absolute 0.0 0.0 - 0.5 K/uL   Basophils Relative 0 %   Basophils Absolute 0.0 0.0 - 0.1 K/uL   WBC Morphology NORMAL    RBC Morphology NORMAL    nRBC NONE SEEN 0 /100 WBC   Metamyelocytes Relative NONE SEEN %   Myelocytes NONE SEEN %   Promyelocytes Relative NONE SEEN %   Blasts 1 %   Immature Granulocytes NONE SEEN %   Abs Immature Granulocytes NONE SEEN 0.00 - 0.07 K/uL  Comment: Performed at [facility] [place] [address], [place], [place] [address]  Comprehensive metabolic panel     Status: Abnormal   Collection Time: 12/31/20  2:04 AM  Result Value Ref Range   Sodium 148 (H) 135 - 145 mmol/L   Potassium 3.8 3.5 - 5.1 mmol/L   Chloride 117 (H) 98 - 111 mmol/L   CO2 15 (L) 22 - 32 mmol/L   Glucose, Bld 125 (H) 70 - 99 mg/dL    Comment: Glucose reference range applies only to samples taken after fasting for at least 8 hours.   BUN 51 (H) 8 - 23 mg/dL   Creatinine, Ser 1.47 (H) 0.61 - 1.24 mg/dL   Calcium 9.0 8.9 - 10.3 mg/dL   Total Protein 6.6 6.5 - 8.1 g/dL   Albumin 3.2 (L) 3.5 - 5.0 g/dL   AST 86 (H) 15 - 41 U/L   ALT 38 0 - 44 U/L   Alkaline Phosphatase 62 38 - 126 U/L   Total Bilirubin 0.9 0.3 - 1.2 mg/dL   GFR, Estimated 46 (L) >60 mL/min    Comment: (NOTE) Calculated using the CKD-EPI Creatinine Equation (2021)    Anion gap 16 (H) 5 - 15    Comment: Performed at [facility], [place] [address] [place], [place] [address]  Magnesium     Status: None   Collection Time: 12/31/20  2:04 AM  Result Value Ref Range   Magnesium 2.4 1.7 - 2.4 mg/dL    Comment: Performed at [facility], [place]  [address], [place], [place] [address]  TSH     Status: None   Collection Time: 12/31/20  2:04 AM  Result Value Ref Range   TSH 0.442 0.350 - 4.500 uIU/mL    Comment: Performed by a 3rd Generation assay with a functional sensitivity of <=0.01 uIU/mL. Performed at [facility], [place] [address] [place] [address], [place], [place] [address]   Vitamin B12     Status: None   Collection Time: 12/31/20  2:04 AM  Result Value Ref Range   Vitamin B-12 756 180 - 914 pg/mL    Comment: (NOTE) This assay is not validated for testing neonatal or myeloproliferative syndrome specimens for Vitamin B12 levels. Performed at [facility], [place] [address], [place], [place] [address]   Folate     Status: None   Collection Time: 12/31/20  2:04 AM  Result Value Ref Range   Folate 16.6 >5.9 ng/mL    Comment: Performed at [facility], [place] [address], [place], [place] [address]    Imaging / Studies: DG ABD ACUTE 2+V W 1V CHEST  Result Date: 12/30/2020 CLINICAL DATA:  Abdominal distension. Reported Clostridium difficile colitis EXAM: DG ABDOMEN ACUTE WITH 1 VIEW CHEST COMPARISON:  CT abdomen and pelvis Dec 27, 2020. Chest radiograph Dec 27, 2020 FINDINGS: PA chest: There is mild left base atelectasis. The lungs elsewhere are clear. Heart is mildly enlarged with pulmonary vascularity normal. No appreciable adenopathy. Supine and upright abdomen: There are loops of mildly dilated colon. No air-fluid levels. No appreciable small bowel dilatation. No air-fluid levels. No free air. No abnormal calcifications. IMPRESSION: Question a degree of colonic ileus or colitis. No obstructing focus evident. No free air. Left base atelectasis. Lungs elsewhere clear. Mild cardiac enlargement. Electronically Signed   By: Lowella Grip III M.D.   On: 12/30/2020 09:41   ECHOCARDIOGRAM COMPLETE  Result Date: 12/30/2020    ECHOCARDIOGRAM REPORT  Patient Name:   KESTER GALKA Date of Exam:  12/30/2020 Medical Rec #:  [id number]        Height:       67.0 in Accession #:    [id number]       Weight:       171.3 lb Date of Birth:  08-26-1930         BSA:          1.893 m Patient Age:    87 years         BP:           160/57 mmHg Patient Gender: M                HR:           110 bpm. Exam Location:  Inpatient Procedure: 2D Echo, Cardiac Doppler and Color Doppler Indications:    Atrial fibrillation  History:        Patient has no prior history of Echocardiogram examinations.                 CAD, COPD, Arrythmias:Atrial Fibrillation,                 Signs/Symptoms:Altered Mental Status and Sepsis; Risk                 Factors:Dyslipidemia and Hypertension.  Sonographer:    Dustin Flock Referring Phys: [id number] [place]  1. Left ventricular ejection fraction, by estimation, is 55 to 60%. The left ventricle has normal function. The left ventricle has no regional wall motion abnormalities. There is mild concentric left ventricular hypertrophy. Diastolic function indeterminant due to atrial fibrillation.  2. Right ventricular systolic function is normal. The right ventricular size is normal. There is mildly elevated pulmonary artery systolic pressure.  3. Left atrial size was mildly dilated.  4. Right atrial size was mildly dilated.  5. The mitral valve is normal in structure. Trivial mitral valve regurgitation. No evidence of mitral stenosis.  6. The aortic valve is tricuspid. There is mild calcification of the aortic valve. There is mild thickening of the aortic valve. Aortic valve regurgitation is not visualized. Mild aortic valve sclerosis is present, with no evidence of aortic valve stenosis.  7. The inferior vena cava is normal in size with greater than 50% respiratory variability, suggesting right atrial pressure of 3 mmHg. Comparison(s): No prior Echocardiogram. FINDINGS  Left Ventricle: Left ventricular ejection fraction, by estimation, is 55 to 60%. The left ventricle has normal  function. The left ventricle has no regional wall motion abnormalities. The left ventricular internal cavity size was normal in size. There is  mild concentric left ventricular hypertrophy. Diastolic function indeterminant due to atrial fibrillation. Right Ventricle: The right ventricular size is normal. Right vetricular wall thickness was not well visualized. Right ventricular systolic function is normal. There is mildly elevated pulmonary artery systolic pressure. The tricuspid regurgitant velocity  is 3.01 m/s, and with an assumed right atrial pressure of 3 mmHg, the estimated right ventricular systolic pressure is 123XX123 mmHg. Left Atrium: Left atrial size was mildly dilated. Right Atrium: Right atrial size was mildly dilated. Pericardium: There is no evidence of pericardial effusion. Mitral Valve: The mitral valve is normal in structure. Trivial mitral valve regurgitation. No evidence of mitral valve stenosis. Tricuspid Valve: The tricuspid valve is normal in structure. Tricuspid valve regurgitation is trivial. Aortic Valve: The aortic valve is tricuspid. There is mild calcification of the aortic valve. There is  mild thickening of the aortic valve. Aortic valve regurgitation is not visualized. Mild aortic valve sclerosis is present, with no evidence of aortic valve stenosis. Pulmonic Valve: The pulmonic valve was not well visualized. Pulmonic valve regurgitation is trivial. Aorta: The aortic root is normal in size and structure. Venous: The inferior vena cava is normal in size with greater than 50% respiratory variability, suggesting right atrial pressure of 3 mmHg. IAS/Shunts: No atrial level shunt detected by color flow Doppler.  LEFT VENTRICLE PLAX 2D LVIDd:         5.40 cm  Diastology LVIDs:         3.70 cm  LV e' medial:    6.31 cm/s LV PW:         1.30 cm  LV E/e' medial:  18.7 LV IVS:        1.30 cm  LV e' lateral:   10.90 cm/s LVOT diam:     2.50 cm  LV E/e' lateral: 10.8 LV SV:         75 LV SV Index:    40 LVOT Area:     4.91 cm  RIGHT VENTRICLE RV Basal diam:  3.00 cm RV S prime:     10.70 cm/s TAPSE (M-mode): 2.1 cm LEFT ATRIUM             Index       RIGHT ATRIUM           Index LA diam:        5.40 cm 2.85 cm/m  RA Area:     24.20 cm LA Vol (A2C):   54.4 ml 28.73 ml/m RA Volume:   76.70 ml  40.51 ml/m LA Vol (A4C):   75.6 ml 39.93 ml/m LA Biplane Vol: 69.7 ml 36.81 ml/m  AORTIC VALVE LVOT Vmax:   86.70 cm/s LVOT Vmean:  59.700 cm/s LVOT VTI:    0.153 m  AORTA Ao Root diam: 3.60 cm MITRAL VALVE                TRICUSPID VALVE MV Area (PHT): 6.02 cm     TR Peak grad:   36.2 mmHg MV Decel Time: 126 msec     TR Vmax:        301.00 cm/s MV E velocity: 118.00 cm/s                             SHUNTS                             Systemic VTI:  0.15 m                             Systemic Diam: 2.50 cm Gwyndolyn Kaufman MD Electronically signed by Gwyndolyn Kaufman MD Signature Date/Time: 12/30/2020/3:00:41 PM    Final     Medications / Allergies: per chart  Antibiotics: Anti-infectives (From admission, onward)   Start     Dose/Rate Route Frequency Ordered Stop   12/30/20 1200  vancomycin (VANCOCIN) 50 mg/mL oral solution 500 mg        500 mg Oral Every 6 hours 12/30/20 0956     12/30/20 1200  metroNIDAZOLE (FLAGYL) IVPB 500 mg        500 mg 100 mL/hr over 60 Minutes Intravenous Every 8 hours 12/30/20 0956     12/29/20 0600  vancomycin (VANCOREADY) IVPB 1250 mg/250 mL  Status:  Discontinued        1,250 mg 166.7 mL/hr over 90 Minutes Intravenous Every 36 hours 12/27/20 2315 12/28/20 1007   12/28/20 2000  vancomycin (VANCOCIN) 50 mg/mL oral solution 125 mg  Status:  Discontinued        125 mg Oral Every 6 hours 12/28/20 1801 12/30/20 0956   12/28/20 0830  clindamycin (CLEOCIN) IVPB 900 mg  Status:  Discontinued       "And" Linked Group Details   900 mg 100 mL/hr over 30 Minutes Intravenous On call to O.R. 12/28/20 5093 12/29/20 0559   12/28/20 0830  gentamicin (GARAMYCIN) 360 mg in dextrose 5 %  100 mL IVPB  Status:  Discontinued       "And" Linked Group Details   5 mg/kg  71.2 kg 109 mL/hr over 60 Minutes Intravenous On call to O.R. 12/28/20 2671 12/29/20 0559   12/28/20 0600  ceFEPIme (MAXIPIME) 2 g in sodium chloride 0.9 % 100 mL IVPB  Status:  Discontinued        2 g 200 mL/hr over 30 Minutes Intravenous Every 12 hours 12/27/20 2315 12/29/20 0955   12/28/20 0200  metroNIDAZOLE (FLAGYL) IVPB 500 mg  Status:  Discontinued        500 mg 100 mL/hr over 60 Minutes Intravenous Every 8 hours 12/27/20 2202 12/29/20 0955   12/27/20 1830  aztreonam (AZACTAM) 2 g in sodium chloride 0.9 % 100 mL IVPB  Status:  Discontinued        2 g 200 mL/hr over 30 Minutes Intravenous  Once 12/27/20 1820 12/27/20 1823   12/27/20 1830  metroNIDAZOLE (FLAGYL) IVPB 500 mg        500 mg 100 mL/hr over 60 Minutes Intravenous  Once 12/27/20 1820 12/27/20 1935   12/27/20 1830  vancomycin (VANCOCIN) IVPB 1000 mg/200 mL premix        1,000 mg 200 mL/hr over 60 Minutes Intravenous  Once 12/27/20 1820 12/27/20 1934   12/27/20 1830  ceFEPIme (MAXIPIME) 2 g in sodium chloride 0.9 % 100 mL IVPB        2 g 200 mL/hr over 30 Minutes Intravenous  Once 12/27/20 1824 12/27/20 1903        Note: Portions of this report may have been transcribed using voice recognition software. Every effort was made to ensure accuracy; however, inadvertent computerized transcription errors may be present.   Any transcriptional errors that result from this process are unintentional.    Adin Hector, MD, FACS, MASCRS  Esophageal, Gastrointestinal & Colorectal Surgery Robotic and Minimally Invasive Surgery [facility] [address] [place], [place] [address] [phone] Fax [phone] Main/Paging  CONTACT INFORMATION: Weekday (9AM-5PM) concerns: Call [facility] main office at [phone] Weeknight (5PM-9AM) or Weekend/Holiday concerns: Check [email or web address] for General Surgery [facility]  coverage (Please, do not use SecureChat as it is not reliable communication to operating surgeons for immediate patient care)      12/31/2020  8:31 AM

## 2020-12-31 NOTE — Progress Notes (Signed)
Cardiology Progress Note  Patient ID: Leon Estes MRN: [id number] DOB: Aug 23, 1930 Date of Encounter: 12/31/2020  Primary Cardiologist: Mertie Moores, MD  Subjective   Chief Complaint: Confused.  Moved to the ICU.  HPI: Confused.  Very altered.  Moved to the ICU.  Telemetry shows he is in and out of A. fib.  Hemodynamically stable.  ROS:  All other ROS reviewed and negative. Pertinent positives noted in the HPI.     Inpatient Medications  Scheduled Meds: . aspirin EC  81 mg Oral Daily  . Chlorhexidine Gluconate Cloth  6 each Topical Daily  . heparin  5,000 Units Subcutaneous Q8H  . LORazepam  1 mg Intravenous Once  . mouth rinse  15 mL Mouth Rinse BID  . metoprolol tartrate  25 mg Oral Q6H  . mometasone-formoterol  2 puff Inhalation BID  . mupirocin ointment  1 application Nasal BID  . pantoprazole  40 mg Oral Daily  . simvastatin  40 mg Oral QPM  . sodium chloride flush  3 mL Intravenous Q12H  . tamsulosin  0.4 mg Oral Daily  . vancomycin  500 mg Oral Q6H   Continuous Infusions: . metronidazole Stopped (12/31/20 0441)   PRN Meds: acetaminophen, albuterol, haloperidol lactate, methocarbamol, metoprolol tartrate, ondansetron (ZOFRAN) IV   Vital Signs   Vitals:   12/31/20 0311 12/31/20 0343 12/31/20 0430 12/31/20 0653  BP: (!) 173/129     Pulse:    100  Resp: 20     Temp:   97.6 F (36.4 C)   TempSrc:   Oral   SpO2:      Weight:  74.5 kg    Height:        Intake/Output Summary (Last 24 hours) at 12/31/2020 0716 Last data filed at 12/31/2020 0659 Gross per 24 hour  Intake 103 ml  Output 2200 ml  Net -2097 ml   Last 3 Weights 12/31/2020 12/30/2020 12/29/2020  Weight (lbs) 164 lb 3.9 oz 171 lb 4.8 oz 167 lb 12.3 oz  Weight (kg) 74.5 kg 77.7 kg 76.1 kg      Telemetry  Overnight telemetry shows paroxysmal atrial fibrillation, which I personally reviewed.   ECG  The most recent ECG shows atrial fibrillation heart rate 110, right bundle branch block, which I  personally reviewed.   Physical Exam   Vitals:   12/31/20 0311 12/31/20 0343 12/31/20 0430 12/31/20 0653  BP: (!) 173/129     Pulse:    100  Resp: 20     Temp:   97.6 F (36.4 C)   TempSrc:   Oral   SpO2:      Weight:  74.5 kg    Height:         Intake/Output Summary (Last 24 hours) at 12/31/2020 0716 Last data filed at 12/31/2020 0659 Gross per 24 hour  Intake 103 ml  Output 2200 ml  Net -2097 ml    Last 3 Weights 12/31/2020 12/30/2020 12/29/2020  Weight (lbs) 164 lb 3.9 oz 171 lb 4.8 oz 167 lb 12.3 oz  Weight (kg) 74.5 kg 77.7 kg 76.1 kg    Body mass index is 25.72 kg/m.  General: Confused Head: Atraumatic, normal size  Eyes: PEERLA, EOMI  Neck: Supple, no JVD Endocrine: No thryomegaly Cardiac: Normal S1, S2; irregular rhythm Lungs: Diminished breath sounds bilaterally Abd: Distended abdomen Ext: Warm extremities, no edema Musculoskeletal: No deformities, BUE and BLE strength normal and equal Skin: Warm and dry, no rashes   Neuro: Alert, awake, quite  confused, not following commands  Labs  High Sensitivity Troponin:  No results for input(s): TROPONINIHS in the last 720 hours.   Cardiac EnzymesNo results for input(s): TROPONINI in the last 168 hours. No results for input(s): TROPIPOC in the last 168 hours.  Chemistry Recent Labs  Lab 12/29/20 0256 12/30/20 0751 12/31/20 0204  NA 141 143 148*  K 4.1 4.2 3.8  CL 116* 116* 117*  CO2 19* 18* 15*  GLUCOSE 130* 113* 125*  BUN 37* 35* 51*  CREATININE 1.34* 1.24 1.47*  CALCIUM 7.8* 8.4* 9.0  PROT 5.6* 6.5 6.6  ALBUMIN 2.8* 3.0* 3.2*  AST 50* 51* 86*  ALT 22 29 38  ALKPHOS 43 51 62  BILITOT 0.4 0.7 0.9  GFRNONAA 51* 56* 46*  ANIONGAP 6 9 16*    Hematology Recent Labs  Lab 12/29/20 0256 12/30/20 0751 12/31/20 0204  WBC 7.5 12.2* 12.0*  RBC 3.83* 3.94* 3.60*  HGB 11.1* 11.1* 10.1*  HCT 36.0* 37.0* 33.0*  MCV 94.0 93.9 91.7  MCH 29.0 28.2 28.1  MCHC 30.8 30.0 30.6  RDW 14.6 14.6 14.6  PLT 164 234 232    BNP Recent Labs  Lab 12/27/20 1754  BNP 689.3*    DDimer No results for input(s): DDIMER in the last 168 hours.   Radiology  DG ABD ACUTE 2+V W 1V CHEST  Result Date: 12/30/2020 CLINICAL DATA:  Abdominal distension. Reported Clostridium difficile colitis EXAM: DG ABDOMEN ACUTE WITH 1 VIEW CHEST COMPARISON:  CT abdomen and pelvis Dec 27, 2020. Chest radiograph Dec 27, 2020 FINDINGS: PA chest: There is mild left base atelectasis. The lungs elsewhere are clear. Heart is mildly enlarged with pulmonary vascularity normal. No appreciable adenopathy. Supine and upright abdomen: There are loops of mildly dilated colon. No air-fluid levels. No appreciable small bowel dilatation. No air-fluid levels. No free air. No abnormal calcifications. IMPRESSION: Question a degree of colonic ileus or colitis. No obstructing focus evident. No free air. Left base atelectasis. Lungs elsewhere clear. Mild cardiac enlargement. Electronically Signed   By: Lowella Grip III M.D.   On: 12/30/2020 09:41   ECHOCARDIOGRAM COMPLETE  Result Date: 12/30/2020    ECHOCARDIOGRAM REPORT   Patient Name:   Leon Estes Date of Exam: 12/30/2020 Medical Rec #:  [id number]        Height:       67.0 in Accession #:    [id number]       Weight:       171.3 lb Date of Birth:  [birth year]         BSA:          1.893 m Patient Age:    87 years         BP:           160/57 mmHg Patient Gender: M                HR:           110 bpm. Exam Location:  Inpatient Procedure: 2D Echo, Cardiac Doppler and Color Doppler Indications:    Atrial fibrillation  History:        Patient has no prior history of Echocardiogram examinations.                 CAD, COPD, Arrythmias:Atrial Fibrillation,                 Signs/Symptoms:Altered Mental Status and Sepsis; Risk  Factors:Dyslipidemia and Hypertension.  Sonographer:    Dustin Flock Referring Phys: [id number] [place]  1. Left ventricular ejection fraction, by  estimation, is 55 to 60%. The left ventricle has normal function. The left ventricle has no regional wall motion abnormalities. There is mild concentric left ventricular hypertrophy. Diastolic function indeterminant due to atrial fibrillation.  2. Right ventricular systolic function is normal. The right ventricular size is normal. There is mildly elevated pulmonary artery systolic pressure.  3. Left atrial size was mildly dilated.  4. Right atrial size was mildly dilated.  5. The mitral valve is normal in structure. Trivial mitral valve regurgitation. No evidence of mitral stenosis.  6. The aortic valve is tricuspid. There is mild calcification of the aortic valve. There is mild thickening of the aortic valve. Aortic valve regurgitation is not visualized. Mild aortic valve sclerosis is present, with no evidence of aortic valve stenosis.  7. The inferior vena cava is normal in size with greater than 50% respiratory variability, suggesting right atrial pressure of 3 mmHg. Comparison(s): No prior Echocardiogram. FINDINGS  Left Ventricle: Left ventricular ejection fraction, by estimation, is 55 to 60%. The left ventricle has normal function. The left ventricle has no regional wall motion abnormalities. The left ventricular internal cavity size was normal in size. There is  mild concentric left ventricular hypertrophy. Diastolic function indeterminant due to atrial fibrillation. Right Ventricle: The right ventricular size is normal. Right vetricular wall thickness was not well visualized. Right ventricular systolic function is normal. There is mildly elevated pulmonary artery systolic pressure. The tricuspid regurgitant velocity  is 3.01 m/s, and with an assumed right atrial pressure of 3 mmHg, the estimated right ventricular systolic pressure is 62.8 mmHg. Left Atrium: Left atrial size was mildly dilated. Right Atrium: Right atrial size was mildly dilated. Pericardium: There is no evidence of pericardial effusion.  Mitral Valve: The mitral valve is normal in structure. Trivial mitral valve regurgitation. No evidence of mitral valve stenosis. Tricuspid Valve: The tricuspid valve is normal in structure. Tricuspid valve regurgitation is trivial. Aortic Valve: The aortic valve is tricuspid. There is mild calcification of the aortic valve. There is mild thickening of the aortic valve. Aortic valve regurgitation is not visualized. Mild aortic valve sclerosis is present, with no evidence of aortic valve stenosis. Pulmonic Valve: The pulmonic valve was not well visualized. Pulmonic valve regurgitation is trivial. Aorta: The aortic root is normal in size and structure. Venous: The inferior vena cava is normal in size with greater than 50% respiratory variability, suggesting right atrial pressure of 3 mmHg. IAS/Shunts: No atrial level shunt detected by color flow Doppler.  LEFT VENTRICLE PLAX 2D LVIDd:         5.40 cm  Diastology LVIDs:         3.70 cm  LV e' medial:    6.31 cm/s LV PW:         1.30 cm  LV E/e' medial:  18.7 LV IVS:        1.30 cm  LV e' lateral:   10.90 cm/s LVOT diam:     2.50 cm  LV E/e' lateral: 10.8 LV SV:         75 LV SV Index:   40 LVOT Area:     4.91 cm  RIGHT VENTRICLE RV Basal diam:  3.00 cm RV S prime:     10.70 cm/s TAPSE (M-mode): 2.1 cm LEFT ATRIUM  Index       RIGHT ATRIUM           Index LA diam:        5.40 cm 2.85 cm/m  RA Area:     24.20 cm LA Vol (A2C):   54.4 ml 28.73 ml/m RA Volume:   76.70 ml  40.51 ml/m LA Vol (A4C):   75.6 ml 39.93 ml/m LA Biplane Vol: 69.7 ml 36.81 ml/m  AORTIC VALVE LVOT Vmax:   86.70 cm/s LVOT Vmean:  59.700 cm/s LVOT VTI:    0.153 m  AORTA Ao Root diam: 3.60 cm MITRAL VALVE                TRICUSPID VALVE MV Area (PHT): 6.02 cm     TR Peak grad:   36.2 mmHg MV Decel Time: 126 msec     TR Vmax:        301.00 cm/s MV E velocity: 118.00 cm/s                             SHUNTS                             Systemic VTI:  0.15 m                              Systemic Diam: 2.50 cm Gwyndolyn Kaufman MD Electronically signed by Gwyndolyn Kaufman MD Signature Date/Time: 12/30/2020/3:00:41 PM    Final     Cardiac Studies  TTE 12/30/2020 1. Left ventricular ejection fraction, by estimation, is 55 to 60%. The  left ventricle has normal function. The left ventricle has no regional  wall motion abnormalities. There is mild concentric left ventricular  hypertrophy. Diastolic function  indeterminant due to atrial fibrillation.  2. Right ventricular systolic function is normal. The right ventricular  size is normal. There is mildly elevated pulmonary artery systolic  pressure.  3. Left atrial size was mildly dilated.  4. Right atrial size was mildly dilated.  5. The mitral valve is normal in structure. Trivial mitral valve  regurgitation. No evidence of mitral stenosis.  6. The aortic valve is tricuspid. There is mild calcification of the  aortic valve. There is mild thickening of the aortic valve. Aortic valve  regurgitation is not visualized. Mild aortic valve sclerosis is present,  with no evidence of aortic valve  stenosis.  7. The inferior vena cava is normal in size with greater than 50%  respiratory variability, suggesting right atrial pressure of 3 mmHg.   Patient Profile  87 year old male with history of CAD status post prior PCI, hypertension, hyperlipidemia, COPD who was admitted with sepsis secondary to acute cholecystitis on 12/27/2020.  Course complicated by [name]. Cardiology was consulted for atrial fibrillation.   He was moved to the ICU on 12/30/2020 for worsening mentation and A. fib with RVR.  Assessment & Plan   1.  Paroxysmal A. Fib -Review of telemetry shows he is in and out of A. fib.  This is driven by sepsis and lactic acidosis.  He is here with acute cholecystitis and also has [name]. -Echocardiogram shows normal LV function with no regional wall motion abnormalities. -He is critically ill  state is driving his A. Fib. -For now would recommend to continue metoprolol tartrate 25 mg every 6 hours.  He  can receive IV as needed. -If okay from surgical standpoint would recommend to start heparin drip.  He is very high risk for bleeding.  He is here with acute cholecystitis and now [name].  He will likely need anticoagulation once his condition resolves.  This can be started once there is no longer a need for procedures. -Again if his bleeding risk is too high given his current state would recommend to withhold. -If blood pressures become unstable would recommend amiodarone.  This will help keep him in sinus rhythm.  He appears to be going in and out of A. Fib.  2.  Elevated BNP -He is not volume overloaded.  He has a collapsing IVC.  I would recommend fluid resuscitation if needed.  Would not recommend further Lasix.  For questions or updates, please contact [place] Please consult [email or web address] for contact info under   Time Spent with Patient: I have spent a total of 25 minutes with patient reviewing hospital notes, telemetry, EKGs, labs and examining the patient as well as establishing an assessment and plan that was discussed with the patient.  > 50% of time was spent in direct patient care.    Signed, Addison Naegeli. Audie Box, MD, [place]  12/31/2020 7:16 AM

## 2020-12-31 NOTE — Progress Notes (Signed)
Patient ID: Leon MEININGER, male   DOB: 10-23-1930, 87 y.o.   MRN: [id number]  PROGRESS NOTE    Leon Estes  [id number] DOB: 08/25/1930 DOA: 12/27/2020 PCP: Venia Carbon, MD   Brief Narrative:  87 year old male with history of CAD status post stenting to RCA in 1997, COPD, CKD stage III, hypertension, hyperlipidemia, BPH, recent admission at [facility] from 11/26/2020-11/29/2020 with presumed acute cholecystitis (RUQ ultrasound and CT abdomen/pelvis were equivocal.  HIDA scan was consistent with cystic duct obstruction) treated medically with antibiotics and he was followed up by general surgery as an outpatient with plans for laparoscopic cholecystectomy on 01/10/2021.  He presented on 12/27/2020 with fever and generalized weakness.  On presentation, temperature was 103.1 F rectally along with tachypnea and tachycardia, WBC of 15.3, creatinine of 1.48, normal LFTs, lactic acid of 2.8 and then 3.2 and BNP of 689.3.  COVID-19 and influenza tests were negative.  Chest x-ray was negative for focal consolidation. CT abdomen/pelvis without contrast showed prostatomegaly, stable appearance of the gallbladder with mild stable central intrahepatic biliary dilatation, and extensive chronic and degenerative changes within the lower lumbar spine.  He was started on IV fluids and antibiotics.  General surgery was consulted.  Initially plan was for surgical intervention but it was postponed once stool tested positive for C. difficile and patient was started on oral vancomycin.  Assessment & Plan:   Severe sepsis, present on admission  [name] Leukocytosis: Resolved ?  Acute on chronic cholecystitis -Patient was recently hospitalized at [facility] with presumed cholecystitis treated with antibiotics with outpatient follow-up with general surgery with plans for laparoscopic cholecystectomy on 01/10/2021. -Presented with temperature of 102.1 F rectally along with tachypnea, tachycardia, WBC of 15.3,  elevated lactic acid.  No clear-cut source of infection with negative chest x-ray. COVID-19 and influenza tests were negative.  - CT abdomen/pelvis showed stable appearance of the gallbladder with mild stable central intrahepatic biliary dilatation.  Right upper quadrant ultrasound shows cholelithiasis without acute cholecystitis -WBCs slightly elevated at 12 today. -General surgery following and initially were planning for surgical intervention on 12/28/2020 but surgery was postponed because of C. difficile.  -Continue n.p.o. for now.  X-ray of abdomen on 12/30/2020 showed a possibly a degree of colonic ileus or colitis.  Vancomycin oral dose was increased to 500 mg every 6 hours on 12/30/2020 and IV Flagyl was added because patient was getting more distended and abdomen and hemodynamically unstable with agitation.  We will repeat abdominal x-ray today.  We will resume gentle hydration today.  Paroxysmal A. fib with RVR -Not on anticoagulation as an outpatient.  -Still intermittently tachycardic.  Cardiology following and has increased oral metoprolol to every 6 hours.  use as needed IV metoprolol. -Echo as below shows EF of 50 to 55%  Delirium/confusion -Had episodes of intermittent delirium/agitation yesterday afternoon as well requiring IV Haldol. -As needed Haldol for severe agitation.  If that does not work, might need restraints.   -vitamin [id number], folic acid and TSH levels normal  CAD status post PCI -Currently stable.  No chest pain.  Continue aspirin, metoprolol, simvastatin.  Outpatient follow-up with cardiology  COPD Acute hypoxic respiratory failure -Currently on 6 L oxygen by nasal cannula.  Wean off as able.  We will repeat chest x-ray today. -Wheezing currently.  Received a dose of IV Solu-Medrol on 12/30/2020.  Will start Solu-Medrol 60 mg IV every 6 hours.  Continue nebs and start Pulmicort.  Hypokalemia -Resolved  Hyponatremia -Possibly from dehydration.  We will start  hydration as below.  Chronic kidney disease stage IIIa -Creatinine slightly worse today.  We will start gentle hydration with dextrose.  Monitor  Hypertension: -Blood pressure on the high side.  Continue increased dose of Toprol  Hyperlipidemia: -Continue simvastatin.  BPH: -Continue tamsulosin.  Generalized deconditioning -will need PT eval once more stable. -Palliative care consultation for goals of care discussion.  Overall prognosis looks very poor at this time.    DVT prophylaxis: Subcutaneous heparin Code Status: DNR Family Communication: Spoke to wife and daughter at bedside on 12/30/2020 Disposition Plan: Status is: Inpatient  Remains inpatient appropriate because:Inpatient level of care appropriate due to severity of illness   Dispo: The patient is from: Home              Anticipated d/c is to: Home              Patient currently is not medically stable to d/c.   Difficult to place patient No  Consultants: General surgery  Procedures: Echo  Antimicrobials:  Anti-infectives (From admission, onward)   Start     Dose/Rate Route Frequency Ordered Stop   12/30/20 1200  vancomycin (VANCOCIN) 50 mg/mL oral solution 500 mg        500 mg Oral Every 6 hours 12/30/20 0956     12/30/20 1200  metroNIDAZOLE (FLAGYL) IVPB 500 mg        500 mg 100 mL/hr over 60 Minutes Intravenous Every 8 hours 12/30/20 0956     12/29/20 0600  vancomycin (VANCOREADY) IVPB 1250 mg/250 mL  Status:  Discontinued        1,250 mg 166.7 mL/hr over 90 Minutes Intravenous Every 36 hours 12/27/20 2315 12/28/20 1007   12/28/20 2000  vancomycin (VANCOCIN) 50 mg/mL oral solution 125 mg  Status:  Discontinued        125 mg Oral Every 6 hours 12/28/20 1801 12/30/20 0956   12/28/20 0830  clindamycin (CLEOCIN) IVPB 900 mg  Status:  Discontinued       "And" Linked Group Details   900 mg 100 mL/hr over 30 Minutes Intravenous On call to O.R. 12/28/20 2355 12/29/20 0559   12/28/20 0830  gentamicin  (GARAMYCIN) 360 mg in dextrose 5 % 100 mL IVPB  Status:  Discontinued       "And" Linked Group Details   5 mg/kg  71.2 kg 109 mL/hr over 60 Minutes Intravenous On call to O.R. 12/28/20 7322 12/29/20 0559   12/28/20 0600  ceFEPIme (MAXIPIME) 2 g in sodium chloride 0.9 % 100 mL IVPB  Status:  Discontinued        2 g 200 mL/hr over 30 Minutes Intravenous Every 12 hours 12/27/20 2315 12/29/20 0955   12/28/20 0200  metroNIDAZOLE (FLAGYL) IVPB 500 mg  Status:  Discontinued        500 mg 100 mL/hr over 60 Minutes Intravenous Every 8 hours 12/27/20 2202 12/29/20 0955   12/27/20 1830  aztreonam (AZACTAM) 2 g in sodium chloride 0.9 % 100 mL IVPB  Status:  Discontinued        2 g 200 mL/hr over 30 Minutes Intravenous  Once 12/27/20 1820 12/27/20 1823   12/27/20 1830  metroNIDAZOLE (FLAGYL) IVPB 500 mg        500 mg 100 mL/hr over 60 Minutes Intravenous  Once 12/27/20 1820 12/27/20 1935   12/27/20 1830  vancomycin (VANCOCIN) IVPB 1000 mg/200 mL premix        1,000 mg 200 mL/hr over 60  Minutes Intravenous  Once 12/27/20 1820 12/27/20 1934   12/27/20 1830  ceFEPIme (MAXIPIME) 2 g in sodium chloride 0.9 % 100 mL IVPB        2 g 200 mL/hr over 30 Minutes Intravenous  Once 12/27/20 1824 12/27/20 1903       Subjective: Patient seen and examined at bedside.  Nursing staff reports episodes of agitation and confusion yesterday requiring Haldol.  No overnight fever or vomiting reported.   Objective: Vitals:   12/31/20 0343 12/31/20 0400 12/31/20 0430 12/31/20 0653  BP:  (!) 176/95    Pulse:  (!) 102  100  Resp:  (!) 23    Temp:   97.6 F (36.4 C)   TempSrc:   Oral   SpO2:  99%    Weight: 74.5 kg     Height:        Intake/Output Summary (Last 24 hours) at 12/31/2020 0749 Last data filed at 12/31/2020 0659 Gross per 24 hour  Intake 103 ml  Output 2200 ml  Net -2097 ml   Filed Weights   12/29/20 0336 12/30/20 0500 12/31/20 0343  Weight: 76.1 kg 77.7 kg 74.5 kg    Examination:  General  exam: No acute distress.  Chronically ill looking.  Currently on 6 L oxygen by nasal cannula.  Elderly male lying in bed. Respiratory system: Decreased breath sounds at bases bilaterally with scattered crackles  cardiovascular system: Tachycardic, S1-S2 heard gastrointestinal system: Abdomen is distended, soft and mildly tender diffusely.  Bowel sounds are sluggish  extremities: Mild lower extremity edema present Central nervous system: Very drowsy, talking sentences which do not make sense.  No focal neurological deficits.  Moves extremities  skin: No obvious ecchymosis/lesions Psychiatry: Cannot assess because of mental status    Data Reviewed: I have personally reviewed following labs and imaging studies  CBC: Recent Labs  Lab 12/27/20 1752 12/28/20 0514 12/29/20 0256 12/30/20 0751 12/31/20 0204  WBC 15.3* 7.8 7.5 12.2* 12.0*  NEUTROABS 11.2*  --  6.7 9.2* 10.3*  HGB 12.9* 10.5* 11.1* 11.1* 10.1*  HCT 42.1 34.7* 36.0* 37.0* 33.0*  MCV 93.1 94.6 94.0 93.9 91.7  PLT 238 174 164 234 732   Basic Metabolic Panel: Recent Labs  Lab 12/27/20 1752 12/28/20 0514 12/29/20 0256 12/30/20 0751 12/31/20 0204  NA 139 139 141 143 148*  K 4.1 3.2* 4.1 4.2 3.8  CL 106 110 116* 116* 117*  CO2 24 19* 19* 18* 15*  GLUCOSE 133* 115* 130* 113* 125*  BUN 22 26* 37* 35* 51*  CREATININE 1.48* 1.40* 1.34* 1.24 1.47*  CALCIUM 8.7* 7.9* 7.8* 8.4* 9.0  MG  --   --  2.1 2.2 2.4   GFR: Estimated Creatinine Clearance: 33.1 mL/min (A) (by C-G formula based on SCr of 1.47 mg/dL (H)). Liver Function Tests: Recent Labs  Lab 12/27/20 1752 12/28/20 0514 12/29/20 0256 12/30/20 0751 12/31/20 0204  AST 31 31 50* 51* 86*  ALT 19 17 22 29  38  ALKPHOS 66 45 43 51 62  BILITOT 0.7 0.5 0.4 0.7 0.9  PROT 7.6 5.3* 5.6* 6.5 6.6  ALBUMIN 4.0 2.7* 2.8* 3.0* 3.2*   Recent Labs  Lab 12/27/20 1752  LIPASE 22   Recent Labs  Lab 12/30/20 0751  AMMONIA 14   Coagulation Profile: Recent Labs  Lab  12/27/20 1752  INR 1.3*   Cardiac Enzymes: No results for input(s): CKTOTAL, CKMB, CKMBINDEX, TROPONINI in the last 168 hours. BNP (last 3 results) No results for input(s):  PROBNP in the last 8760 hours. HbA1C: No results for input(s): HGBA1C in the last 72 hours. CBG: No results for input(s): GLUCAP in the last 168 hours. Lipid Profile: No results for input(s): CHOL, HDL, LDLCALC, TRIG, CHOLHDL, LDLDIRECT in the last 72 hours. Thyroid Function Tests: Recent Labs    12/31/20 0204  TSH 0.442   Anemia Panel: Recent Labs    12/31/20 0204  [id number]  FOLATE 16.6   Sepsis Labs: Recent Labs  Lab 12/27/20 1752 12/27/20 1952 12/27/20 2132 12/28/20 0514  PROCALCITON  --   --   --  29.44  LATICACIDVEN 3.2* 2.8* 2.5*  --     Recent Results (from the past 240 hour(s))  Blood Culture (routine x 2)     Status: None (Preliminary result)   Collection Time: 12/27/20  5:52 PM   Specimen: BLOOD  Result Value Ref Range Status   Specimen Description   Final    BLOOD SITE NOT SPECIFIED Performed at [facility] [address], [place], [place] [address]    Special Requests   Final    BOTTLES DRAWN AEROBIC AND ANAEROBIC Blood Culture adequate volume Performed at [place] [address], [place], [place] [address]    Culture   Final    NO GROWTH 3 DAYS Performed at [facility], [place] [address] [place], [place] [address]    Report Status PENDING  Incomplete  Blood Culture (routine x 2)     Status: None (Preliminary result)   Collection Time: 12/27/20  6:19 PM   Specimen: BLOOD  Result Value Ref Range Status   Specimen Description   Final    BLOOD LEFT ANTECUBITAL Performed at [facility] [address] [place], [place] [address]    Special Requests   Final    BOTTLES DRAWN AEROBIC AND ANAEROBIC Blood Culture results may not be optimal due to an inadequate volume of blood received in culture bottles Performed at  [facility] [address], [place], [place] [address]    Culture   Final    NO GROWTH 3 DAYS Performed at [facility], [place] [address] [place], [place] [address]    Report Status PENDING  Incomplete  Resp Panel by RT-PCR (Flu A&B, Covid) Nasopharyngeal Swab     Status: None   Collection Time: 12/27/20  6:21 PM   Specimen: Nasopharyngeal Swab; Nasopharyngeal(NP) swabs in vial transport medium  Result Value Ref Range Status   SARS Coronavirus 2 by RT PCR NEGATIVE NEGATIVE Final    Comment: (NOTE) SARS-CoV-2 target nucleic acids are NOT DETECTED.  The SARS-CoV-2 RNA is generally detectable in upper respiratory specimens during the acute phase of infection. The lowest concentration of SARS-CoV-2 viral copies this assay can detect is 138 copies/mL. A negative result does not preclude SARS-Cov-2 infection and should not be used as the sole basis for treatment or other patient management decisions. A negative result may occur with  improper specimen collection/handling, submission of specimen other than nasopharyngeal swab, presence of viral mutation(s) within the areas targeted by this assay, and inadequate number of viral copies(<138 copies/mL). A negative result must be combined with clinical observations, patient history, and epidemiological information. The expected result is Negative.  Fact Sheet for Patients:  EntrepreneurPulse.com.au  Fact Sheet for Healthcare Providers:  IncredibleEmployment.be  This test is no t yet approved or cleared by the [place] FDA and  has been authorized for detection and/or  diagnosis of SARS-CoV-2 by FDA under an Emergency Use Authorization (EUA). This EUA will remain  in effect (meaning this test can be used) for the duration of the COVID-19 declaration under Section 564(b)(1) of the Act, 21 U.S.C.section 360bbb-3(b)(1), unless the authorization is terminated  or revoked sooner.        Influenza A by PCR NEGATIVE NEGATIVE Final   Influenza B by PCR NEGATIVE NEGATIVE Final    Comment: (NOTE) The Xpert Xpress SARS-CoV-2/FLU/RSV plus assay is intended as an aid in the diagnosis of influenza from Nasopharyngeal swab specimens and should not be used as a sole basis for treatment. Nasal washings and aspirates are unacceptable for Xpert Xpress SARS-CoV-2/FLU/RSV testing.  Fact Sheet for Patients: EntrepreneurPulse.com.au  Fact Sheet for Healthcare Providers: IncredibleEmployment.be  This test is not yet approved or cleared by the [place] FDA and has been authorized for detection and/or diagnosis of SARS-CoV-2 by FDA under an Emergency Use Authorization (EUA). This EUA will remain in effect (meaning this test can be used) for the duration of the COVID-19 declaration under Section 564(b)(1) of the Act, 21 U.S.C. section 360bbb-3(b)(1), unless the authorization is terminated or revoked.  Performed at [facility], [place] [address], [place], [place] [address]   Urine culture     Status: Abnormal   Collection Time: 12/27/20 10:14 PM   Specimen: In/Out Cath Urine  Result Value Ref Range Status   Specimen Description   Final    IN/OUT CATH URINE Performed at [facility] [address] [place], [place] [address]    Special Requests   Final    NONE Performed at [facility], [place] [address], [place], [place] [address]    Culture MULTIPLE SPECIES PRESENT, SUGGEST RECOLLECTION (A)  Final   Report Status 12/29/2020 FINAL  Final  MRSA PCR Screening     Status: Abnormal   Collection Time: 12/28/20  8:46 AM   Specimen: Nasal Mucosa; Nasopharyngeal  Result Value Ref Range Status   MRSA by PCR POSITIVE (A) NEGATIVE Final    Comment:        The GeneXpert MRSA Assay (FDA approved for NASAL specimens only), is one component of a comprehensive MRSA  colonization surveillance program. It is not intended to diagnose MRSA infection nor to guide or monitor treatment for MRSA infections. RESULT CALLED TO, READ BACK BY AND VERIFIED WITH: Leida Lauth RN AT 1112 12/28/20 MULLINS,T Performed at [facility], [place] [address] [place], [place] [address]   C Difficile Quick Screen w PCR reflex     Status: Abnormal   Collection Time: 12/28/20 11:43 AM   Specimen: STOOL  Result Value Ref Range Status   C Diff antigen POSITIVE (A) NEGATIVE Final   C Diff toxin POSITIVE (A) NEGATIVE Final   C Diff interpretation Toxin producing C. difficile detected.  Final    Comment: CRITICAL RESULT CALLED TO, READ BACK BY AND VERIFIED WITH: L.CAUDLE, RN AT 1757 ON 05.18.22 BY N.THOMPSON Performed at [facility] [address], [place], [place] [address]   Gastrointestinal Panel by PCR , Stool     Status: None   Collection Time: 12/28/20 11:43 AM   Specimen: STOOL  Result Value Ref Range Status   Campylobacter species NOT DETECTED NOT DETECTED Final   [place]as shigelloides NOT DETECTED NOT DETECTED Final   Salmonella species NOT DETECTED NOT DETECTED Final   Yersinia enterocolitica NOT DETECTED NOT DETECTED Final   Vibrio species NOT DETECTED  NOT DETECTED Final   Vibrio cholerae NOT DETECTED NOT DETECTED Final   Enteroaggregative E coli (EAEC) NOT DETECTED NOT DETECTED Final   Enteropathogenic E coli (EPEC) NOT DETECTED NOT DETECTED Final   Enterotoxigenic E coli (ETEC) NOT DETECTED NOT DETECTED Final   Shiga like toxin producing E coli (STEC) NOT DETECTED NOT DETECTED Final   Shigella/Enteroinvasive E coli (EIEC) NOT DETECTED NOT DETECTED Final   Cryptosporidium NOT DETECTED NOT DETECTED Final   Cyclospora cayetanensis NOT DETECTED NOT DETECTED Final   Entamoeba histolytica NOT DETECTED NOT DETECTED Final   Giardia lamblia NOT DETECTED NOT DETECTED Final   Adenovirus F40/41 NOT DETECTED NOT DETECTED Final    Astrovirus NOT DETECTED NOT DETECTED Final   Norovirus GI/GII NOT DETECTED NOT DETECTED Final   Rotavirus A NOT DETECTED NOT DETECTED Final   Sapovirus (I, II, IV, and V) NOT DETECTED NOT DETECTED Final    Comment: Performed at [facility], [address], [place], [place] [address]         Radiology Studies: DG ABD ACUTE 2+V W 1V CHEST  Result Date: 12/30/2020 CLINICAL DATA:  Abdominal distension. Reported Clostridium difficile colitis EXAM: DG ABDOMEN ACUTE WITH 1 VIEW CHEST COMPARISON:  CT abdomen and pelvis Dec 27, 2020. Chest radiograph Dec 27, 2020 FINDINGS: PA chest: There is mild left base atelectasis. The lungs elsewhere are clear. Heart is mildly enlarged with pulmonary vascularity normal. No appreciable adenopathy. Supine and upright abdomen: There are loops of mildly dilated colon. No air-fluid levels. No appreciable small bowel dilatation. No air-fluid levels. No free air. No abnormal calcifications. IMPRESSION: Question a degree of colonic ileus or colitis. No obstructing focus evident. No free air. Left base atelectasis. Lungs elsewhere clear. Mild cardiac enlargement. Electronically Signed   By: Lowella Grip III M.D.   On: 12/30/2020 09:41   ECHOCARDIOGRAM COMPLETE  Result Date: 12/30/2020    ECHOCARDIOGRAM REPORT   Patient Name:   Leon Estes Date of Exam: 12/30/2020 Medical Rec #:  [id number]        Height:       67.0 in Accession #:    [id number]       Weight:       171.3 lb Date of Birth:  Apr 10, 1931         BSA:          1.893 m Patient Age:    56 years         BP:           160/57 mmHg Patient Gender: M                HR:           110 bpm. Exam Location:  Inpatient Procedure: 2D Echo, Cardiac Doppler and Color Doppler Indications:    Atrial fibrillation  History:        Patient has no prior history of Echocardiogram examinations.                 CAD, COPD, Arrythmias:Atrial Fibrillation,                 Signs/Symptoms:Altered Mental Status and Sepsis; Risk                  Factors:Dyslipidemia and Hypertension.  Sonographer:    Dustin Flock Referring Phys: [id number] [place]  1. Left ventricular ejection fraction, by estimation, is 55 to 60%. The left ventricle has normal function. The left ventricle has  no regional wall motion abnormalities. There is mild concentric left ventricular hypertrophy. Diastolic function indeterminant due to atrial fibrillation.  2. Right ventricular systolic function is normal. The right ventricular size is normal. There is mildly elevated pulmonary artery systolic pressure.  3. Left atrial size was mildly dilated.  4. Right atrial size was mildly dilated.  5. The mitral valve is normal in structure. Trivial mitral valve regurgitation. No evidence of mitral stenosis.  6. The aortic valve is tricuspid. There is mild calcification of the aortic valve. There is mild thickening of the aortic valve. Aortic valve regurgitation is not visualized. Mild aortic valve sclerosis is present, with no evidence of aortic valve stenosis.  7. The inferior vena cava is normal in size with greater than 50% respiratory variability, suggesting right atrial pressure of 3 mmHg. Comparison(s): No prior Echocardiogram. FINDINGS  Left Ventricle: Left ventricular ejection fraction, by estimation, is 55 to 60%. The left ventricle has normal function. The left ventricle has no regional wall motion abnormalities. The left ventricular internal cavity size was normal in size. There is  mild concentric left ventricular hypertrophy. Diastolic function indeterminant due to atrial fibrillation. Right Ventricle: The right ventricular size is normal. Right vetricular wall thickness was not well visualized. Right ventricular systolic function is normal. There is mildly elevated pulmonary artery systolic pressure. The tricuspid regurgitant velocity  is 3.01 m/s, and with an assumed right atrial pressure of 3 mmHg, the estimated right ventricular systolic  pressure is 123XX123 mmHg. Left Atrium: Left atrial size was mildly dilated. Right Atrium: Right atrial size was mildly dilated. Pericardium: There is no evidence of pericardial effusion. Mitral Valve: The mitral valve is normal in structure. Trivial mitral valve regurgitation. No evidence of mitral valve stenosis. Tricuspid Valve: The tricuspid valve is normal in structure. Tricuspid valve regurgitation is trivial. Aortic Valve: The aortic valve is tricuspid. There is mild calcification of the aortic valve. There is mild thickening of the aortic valve. Aortic valve regurgitation is not visualized. Mild aortic valve sclerosis is present, with no evidence of aortic valve stenosis. Pulmonic Valve: The pulmonic valve was not well visualized. Pulmonic valve regurgitation is trivial. Aorta: The aortic root is normal in size and structure. Venous: The inferior vena cava is normal in size with greater than 50% respiratory variability, suggesting right atrial pressure of 3 mmHg. IAS/Shunts: No atrial level shunt detected by color flow Doppler.  LEFT VENTRICLE PLAX 2D LVIDd:         5.40 cm  Diastology LVIDs:         3.70 cm  LV e' medial:    6.31 cm/s LV PW:         1.30 cm  LV E/e' medial:  18.7 LV IVS:        1.30 cm  LV e' lateral:   10.90 cm/s LVOT diam:     2.50 cm  LV E/e' lateral: 10.8 LV SV:         75 LV SV Index:   40 LVOT Area:     4.91 cm  RIGHT VENTRICLE RV Basal diam:  3.00 cm RV S prime:     10.70 cm/s TAPSE (M-mode): 2.1 cm LEFT ATRIUM             Index       RIGHT ATRIUM           Index LA diam:        5.40 cm 2.85 cm/m  RA Area:  24.20 cm LA Vol (A2C):   54.4 ml 28.73 ml/m RA Volume:   76.70 ml  40.51 ml/m LA Vol (A4C):   75.6 ml 39.93 ml/m LA Biplane Vol: 69.7 ml 36.81 ml/m  AORTIC VALVE LVOT Vmax:   86.70 cm/s LVOT Vmean:  59.700 cm/s LVOT VTI:    0.153 m  AORTA Ao Root diam: 3.60 cm MITRAL VALVE                TRICUSPID VALVE MV Area (PHT): 6.02 cm     TR Peak grad:   36.2 mmHg MV Decel Time: 126  msec     TR Vmax:        301.00 cm/s MV E velocity: 118.00 cm/s                             SHUNTS                             Systemic VTI:  0.15 m                             Systemic Diam: 2.50 cm Gwyndolyn Kaufman MD Electronically signed by Gwyndolyn Kaufman MD Signature Date/Time: 12/30/2020/3:00:41 PM    Final         Scheduled Meds: . aspirin EC  81 mg Oral Daily  . Chlorhexidine Gluconate Cloth  6 each Topical Daily  . heparin  5,000 Units Subcutaneous Q8H  . LORazepam  1 mg Intravenous Once  . mouth rinse  15 mL Mouth Rinse BID  . metoprolol tartrate  25 mg Oral Q6H  . mometasone-formoterol  2 puff Inhalation BID  . mupirocin ointment  1 application Nasal BID  . pantoprazole  40 mg Oral Daily  . simvastatin  40 mg Oral QPM  . sodium chloride flush  3 mL Intravenous Q12H  . tamsulosin  0.4 mg Oral Daily  . vancomycin  500 mg Oral Q6H   Continuous Infusions: . metronidazole Stopped (12/31/20 0441)          Aline August, MD [facility] 12/31/2020, 7:49 AM

## 2020-12-31 NOTE — Consult Note (Signed)
Consultation Note Date: 12/31/2020   Patient Name: AVEDIS BEVIS  DOB: 26-Jun-1931  MRN: [id number]  Age / Sex: 87 y.o., male  PCP: Venia Carbon, MD Referring Physician: Aline August, MD  Reason for Consultation: Establishing goals of care  HPI/Patient Profile: 87 y.o. male  with past medical history of CHF, COPD, choledocholithiasis, CAD status post PCI, COPD, CKD, hypertension, hyperlipidemia, BPH admitted on 12/27/2020 with sepsis.  He was recently admitted to [facility] 4/16 through 4/19 for presumed cholecystitis.  He was being followed by Dr. Redmond Pulling for cholecystectomy later this month.  Initially, there was concern that his presentation was related to cholecystitis again, however, with further work-up it appears that this is actually more from C. difficile.  His hospital course has been further complicated by A. fib, COPD exacerbation, and development of agitation/delirium.  Palliative consulted for goals of care.  Clinical Assessment and Goals of Care: I saw and examined Mr. Westrup today.  He was awake but confused and not able to participate in conversation.  He was restless and agitated throughout encounter.  I met today with patient's wife, son, and daughter.  He has 2 other children as well.  They report that family, his independence, and being active are the most important things to him.  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  He is also remained very active and was out cutting grass last week.  We discussed clinical course as well as wishes moving forward in regard to advanced directives and care plan this hospitalization.  Reviewed complex nature of his condition with multiple comorbidities and concern due to development of agitation/delirium.  Concepts specific to code status and rehospitalization discussed.  We discussed difference between a aggressive medical intervention path  and a palliative, comfort focused care path.  Values and goals of care important to patient and family were attempted to be elicited.  Family understands the severity of the situation and we discussed plan to continue with current interventions while waiting to see if he has improvement with continued supportive care.  We did discuss limitation of care being no intubation or CPR in the event of cardiac or respiratory arrest.  Questions and concerns addressed.   PMT will continue to support holistically.  SUMMARY OF RECOMMENDATIONS   -DNR/DNI -Continue current interventions.  Family understands reality of the situation is that he may not improve.  We discussed plan to continue with current interventions and follow his course of the next 48 hours.  We had initial discussion that if he is not improving, we would need to have further discussion about long-term goals including potential prescription of comfort. -Family is coming to visit again tomorrow around 2 PM.  I will check in with them at that time and we can reevaluate his situation.  Code Status/Advance Care Planning:  DNR  Palliative Prophylaxis:   Delirium Protocol and Frequent Pain Assessment  Standard delirium management (adapted from NICE guidelines 2011 for prevention of delirium):  Provide continuity of care when possible (  avoid frequent changing of surroundings and staff).  Frequent reorientation to time with:  A clock should always be visible.  Make sure Calendar/white board is updated.  Lights on/blinds open during the day and off/closed at night.  Encourage frequent family visits.  Monitor for and treat dehydration/constipation.  Optimize oxygen saturation.  Avoid catheters and IV's when possible and look for/treat infections.  Encourage early mobility.  Assess and treat pain.  Ensure adequate nutrition and functioning dentures.  Address reversible causes of hearing and visual impairment:  Use pocket talker if hearing  aids are unavailable.  Avoid sleep disturbance (normalize sleep/wake cycle).  Minimize disturbances and consider NOT obtaining vitals at night if possible.  Review Medications to avoid polypharmacy and avoid deliriogenic medications when possible:  Benzodiazepines.  Dihydropyridines.  Antihistamines.  Anticholinergics.  (Possibly avoid: H2 blockers, tricyclic antidepressants, antiparkinson medications, steroids, NSAID's).   Psycho-social/Spiritual:   Desire for further Chaplaincy support:no  Additional Recommendations: Caregiving  Support/Resources  Prognosis:   Guarded  Discharge Planning: To Be Determined      Primary Diagnoses: Present on Admission: . Severe sepsis ([place]) . CAD (coronary artery disease) . BPH (benign prostatic hyperplasia) . Benign essential HTN . COPD (chronic obstructive pulmonary disease) ([place]) . Chronic renal disease, stage III ([place])   I have reviewed the medical record, interviewed the patient and family, and examined the patient. The following aspects are pertinent.  Past Medical History:  Diagnosis Date  . Asthma   . BPH (benign prostatic hypertrophy)   . Cataract   . Chronic renal disease, stage III ([place])   . Chronic sinusitis   . COPD (chronic obstructive pulmonary disease) ([place])   . Coronary artery disease    2 stents  . Gall stones   . Hearing loss    DOES NOT WEAR HEARING AIDS  . High cholesterol   . Hypertension   . Macular degeneration    right eye   Social History   Socioeconomic History  . Marital status: Married    Spouse name: Not on file  . Number of children: 4  . Years of education: Not on file  . Highest education level: Not on file  Occupational History  . Occupation: Drove truck and concrete work  Tobacco Use  . Smoking status: Former Smoker    Types: Cigarettes    Quit date: 08/13/2005    Years since quitting: 15.3  . Smokeless tobacco: Current User    Types: Chew  . Tobacco comment: discussed stopping  this (only once in a while)  Vaping Use  . Vaping Use: Never used  Substance and Sexual Activity  . Alcohol use: No  . Drug use: No  . Sexual activity: Not on file  Other Topics Concern  . Not on file  Social History Narrative   No living will   Wife, then [place] daughter should make decisions   Would accept resuscitation   Would probably accept tube feeds   Social Determinants of Health   Financial Resource Strain: Not on file  Food Insecurity: Not on file  Transportation Needs: Not on file  Physical Activity: Not on file  Stress: Not on file  Social Connections: Not on file   Family History  Problem Relation Age of Onset  . Diabetes Father   . Heart disease Brother    Scheduled Meds: . aspirin EC  81 mg Oral Daily  . budesonide (PULMICORT) nebulizer solution  0.5 mg Nebulization BID  . Chlorhexidine Gluconate Cloth  6 each  Topical Daily  . heparin  5,000 Units Subcutaneous Q8H  . ipratropium-albuterol  3 mL Nebulization Q6H  . LORazepam  1 mg Intravenous Once  . mouth rinse  15 mL Mouth Rinse BID  . methylPREDNISolone (SOLU-MEDROL) injection  60 mg Intravenous Q6H  . metoprolol tartrate  25 mg Oral Q6H  . mupirocin ointment  1 application Nasal BID  . pantoprazole  40 mg Oral Daily  . simvastatin  40 mg Oral QPM  . sodium chloride flush  3 mL Intravenous Q12H  . tamsulosin  0.4 mg Oral Daily  . vancomycin  500 mg Oral Q6H   Continuous Infusions: . dextrose 50 mL/hr at 12/31/20 1014  . metronidazole Stopped (12/31/20 1336)   PRN Meds:.acetaminophen, albuterol, haloperidol lactate, methocarbamol, metoprolol tartrate, ondansetron (ZOFRAN) IV Medications Prior to Admission:  Prior to Admission medications   Medication Sig Start Date End Date Taking? Authorizing Provider  aspirin EC 81 MG tablet Take 81 mg by mouth daily.   Yes [provider]  gabapentin (NEURONTIN) 300 MG capsule Take 300 mg by mouth at bedtime.   Yes [provider]   lisinopril (ZESTRIL) 2.5 MG tablet Take 1 tablet (2.5 mg total) by mouth daily. 11/30/20 12/30/20 Yes Nolberto Hanlon, MD  metoprolol succinate (TOPROL-XL) 25 MG 24 hr tablet Take 1 tablet (25 mg total) by mouth daily. 08/02/20  Yes Venia Carbon, MD  pantoprazole (PROTONIX) 40 MG tablet TAKE 1 TABLET BY MOUTH ONCE A DAY Patient taking differently: Take 40 mg by mouth daily. 08/15/20  Yes Venia Carbon, MD  simvastatin (ZOCOR) 40 MG tablet Take 40 mg by mouth every evening.   Yes [provider]  tamsulosin (FLOMAX) 0.4 MG CAPS capsule TAKE 1 CAPSULE BY MOUTH ONCE DAILY Patient taking differently: Take 0.4 mg by mouth daily. 07/14/20  Yes Venia Carbon, MD  traMADol (ULTRAM) 50 MG tablet Take 50 mg by mouth 2 (two) times daily as needed for moderate pain. 08/15/17  Yes [provider]  betamethasone dipropionate 0.05 % cream Apply topically 2 (two) times daily. Patient not taking: Reported on 12/27/2020 08/23/20   Edrick Kins, DPM  budesonide-formoterol (SYMBICORT) 160-4.5 MCG/ACT inhaler INHALE 2 PUFFS INTO THE LUNGS TWICE DAILY Patient taking differently: Inhale 2 puffs into the lungs 2 (two) times daily. 09/28/20   Venia Carbon, MD  gentamicin cream (GARAMYCIN) 0.1 % APPLY 1 APPLICATION TOPICALLY TWICE DAILY Patient not taking: Reported on 12/27/2020 08/21/20   Edrick Kins, DPM  NONFORMULARY OR COMPOUNDED ITEM See pharmacy note 06/25/19   Felipa Furnace, DPM   Allergies  Allergen Reactions  . Sulfa Antibiotics Rash  . Benzalkonium Chloride     "Benzalkonium chloride is a quaternary ammonium antiseptic and disinfectant with actions and uses similar to those of other cationic surfactants. It is also used as an antimicrobial preservative for pharmaceutical products."  . Neosporin [Neomycin-Bacitracin Zn-Polymyx]   . Augmentin [Amoxicillin-Pot Clavulanate] Rash  . Terbinafine Rash  . Terbinafine And Related Rash   Review of Systems  Unable to obtain  Physical  Exam  General: Alert, awake, confused and restless HEENT: No bruits, no goiter, no JVD Heart: Rate controlled currently. Lungs: Good air movement Abdomen: Soft, globally tender, distended, positive bowel sounds.  Ext: No significant edema Skin: Warm and dry  Vital Signs: BP 138/69 (BP Location: Left Arm)   Pulse 84   Temp 98.1 F (36.7 C) (Axillary)   Resp (!) 21   Ht 5' 7" (1.702  m)   Wt 74.5 kg   SpO2 100%   BMI 25.72 kg/m  Pain Scale: 0-10   Pain Score: 0-No pain   SpO2: SpO2: 100 % O2 Device:SpO2: 100 % O2 Flow Rate: .O2 Flow Rate (L/min): 1 L/min  IO: Intake/output summary:   Intake/Output Summary (Last 24 hours) at 12/31/2020 1716 Last data filed at 12/31/2020 1411 Gross per 24 hour  Intake 41.01 ml  Output 950 ml  Net -908.99 ml    LBM: Last BM Date: 12/30/20 Baseline Weight: Weight: 71.2 kg Most recent weight: Weight: 74.5 kg     Palliative Assessment/Data:   Flowsheet Rows   Flowsheet Row Most Recent Value  Intake Tab   Referral Department Hospitalist  Unit at Time of Referral ICU  Palliative Care Primary Diagnosis Sepsis/Infectious Disease  Date Notified 12/30/20  Palliative Care Type New Palliative care  Reason for referral Clarify Goals of Care  Date of Admission 12/27/20  Date first seen by Palliative Care 12/31/20  # of days Palliative referral response time 1 Day(s)  # of days IP prior to Palliative referral 3  Clinical Assessment   Palliative Performance Scale Score 20%  Psychosocial & Spiritual Assessment   Palliative Care Outcomes   Patient/Family meeting held? Yes  Who was at the meeting? Wife, son, daughter  Palliative Care Outcomes Clarified goals of care      Time In: 8502 Time Out: 1500 Time Total: 80 Greater than 50%  of this time was spent counseling and coordinating care related to the above assessment and plan.  Signed by: Micheline Rough, MD   Please contact Palliative Medicine Team phone at [phone] for questions and  concerns.  For individual provider: See Shea Evans

## 2021-01-01 DIAGNOSIS — A0472 Enterocolitis due to Clostridium difficile, not specified as recurrent: Secondary | ICD-10-CM | POA: Diagnosis not present

## 2021-01-01 DIAGNOSIS — K812 Acute cholecystitis with chronic cholecystitis: Secondary | ICD-10-CM | POA: Diagnosis not present

## 2021-01-01 DIAGNOSIS — J9601 Acute respiratory failure with hypoxia: Secondary | ICD-10-CM

## 2021-01-01 DIAGNOSIS — A419 Sepsis, unspecified organism: Secondary | ICD-10-CM | POA: Diagnosis not present

## 2021-01-01 LAB — COMPREHENSIVE METABOLIC PANEL
ALT: 55 U/L — ABNORMAL HIGH (ref 0–44)
AST: 93 U/L — ABNORMAL HIGH (ref 15–41)
Albumin: 3.4 g/dL — ABNORMAL LOW (ref 3.5–5.0)
Alkaline Phosphatase: 70 U/L (ref 38–126)
Anion gap: 7 (ref 5–15)
BUN: 60 mg/dL — ABNORMAL HIGH (ref 8–23)
CO2: 24 mmol/L (ref 22–32)
Calcium: 9.1 mg/dL (ref 8.9–10.3)
Chloride: 118 mmol/L — ABNORMAL HIGH (ref 98–111)
Creatinine, Ser: 1.35 mg/dL — ABNORMAL HIGH (ref 0.61–1.24)
GFR, Estimated: 51 mL/min — ABNORMAL LOW (ref >60)
Glucose, Bld: 184 mg/dL — ABNORMAL HIGH (ref 70–99)
Potassium: 3.2 mmol/L — ABNORMAL LOW (ref 3.5–5.1)
Sodium: 149 mmol/L — ABNORMAL HIGH (ref 135–145)
Total Bilirubin: 0.7 mg/dL (ref 0.3–1.2)
Total Protein: 6.9 g/dL (ref 6.5–8.1)

## 2021-01-01 LAB — CBC WITH DIFFERENTIAL/PLATELET
Abs Immature Granulocytes: 0.17 10*3/uL — ABNORMAL HIGH (ref 0.00–0.07)
Basophils Absolute: 0 10*3/uL (ref 0.0–0.1)
Basophils Relative: 0 %
Eosinophils Absolute: 0 10*3/uL (ref 0.0–0.5)
Eosinophils Relative: 0 %
HCT: 34.8 % — ABNORMAL LOW (ref 39.0–52.0)
Hemoglobin: 10.6 g/dL — ABNORMAL LOW (ref 13.0–17.0)
Immature Granulocytes: 1 %
Lymphocytes Relative: 6 %
Lymphs Abs: 0.9 10*3/uL (ref 0.7–4.0)
MCH: 28 pg (ref 26.0–34.0)
MCHC: 30.5 g/dL (ref 30.0–36.0)
MCV: 91.8 fL (ref 80.0–100.0)
Monocytes Absolute: 0.9 10*3/uL (ref 0.1–1.0)
Monocytes Relative: 7 %
Neutro Abs: 12.1 10*3/uL — ABNORMAL HIGH (ref 1.7–7.7)
Neutrophils Relative %: 86 %
Platelets: 265 10*3/uL (ref 150–400)
RBC: 3.79 MIL/uL — ABNORMAL LOW (ref 4.22–5.81)
RDW: 14.9 % (ref 11.5–15.5)
WBC: 14.1 10*3/uL — ABNORMAL HIGH (ref 4.0–10.5)
nRBC: 0 % (ref 0.0–0.2)

## 2021-01-01 LAB — CULTURE, BLOOD (ROUTINE X 2)
Culture: NO GROWTH
Culture: NO GROWTH
Special Requests: ADEQUATE

## 2021-01-01 LAB — MAGNESIUM: Magnesium: 2.4 mg/dL (ref 1.7–2.4)

## 2021-01-01 MED ORDER — IPRATROPIUM-ALBUTEROL 0.5-2.5 (3) MG/3ML IN SOLN
3.0000 mL | Freq: Two times a day (BID) | RESPIRATORY_TRACT | Status: DC
  Administered 2021-01-01 – 2021-01-14 (×25): 3 mL via RESPIRATORY_TRACT
  Filled 2021-01-01 (×26): qty 3

## 2021-01-01 MED ORDER — POTASSIUM CHLORIDE 2 MEQ/ML IV SOLN
INTRAVENOUS | Status: DC
  Filled 2021-01-01 (×4): qty 1000

## 2021-01-01 MED ORDER — METHYLPREDNISOLONE SODIUM SUCC 40 MG IJ SOLR
40.0000 mg | Freq: Three times a day (TID) | INTRAMUSCULAR | Status: DC
  Administered 2021-01-01 – 2021-01-02 (×3): 40 mg via INTRAVENOUS
  Filled 2021-01-01 (×3): qty 1

## 2021-01-01 MED ORDER — HALOPERIDOL LACTATE 5 MG/ML IJ SOLN
1.0000 mg | INTRAMUSCULAR | Status: DC | PRN
  Administered 2021-01-01 – 2021-01-06 (×3): 2 mg via INTRAVENOUS
  Filled 2021-01-01 (×3): qty 1

## 2021-01-01 MED ORDER — LORAZEPAM 2 MG/ML IJ SOLN
0.5000 mg | INTRAMUSCULAR | Status: DC | PRN
  Administered 2021-01-02 – 2021-01-06 (×2): 1 mg via INTRAVENOUS
  Filled 2021-01-01 (×2): qty 1

## 2021-01-01 NOTE — Progress Notes (Signed)
Daily Progress Note   Patient Name: Leon Estes       Date: 01/01/2021 DOB: Mar 06, 1931  Age: 87 y.o. MRN#: [id number] Attending Physician: Aline August, MD Primary Care Physician: Venia Carbon, MD Admit Date: 12/27/2020  Reason for Consultation/Follow-up: Establishing goals of care  Subjective: I met today with patient, his wife, and 2 children.  He was awake and interactive during encounter.  I discussed with family regarding his condition earlier today with increased confusion refusing to take medications.  They are happy to see that he is improved this afternoon.  We discussed concern that he continues to have collapsing waning course with dementia and while he is currently awake and cooperating in care, he remains critically ill and could worsen again at any point.  We discussed that it things do worsen again and he is not able to take oral medications, we will need to reassess care plan and goals moving forward.  Discussed treatment of C. difficile if he is not able to swallow medication would include Vanc enemas which would certainly be invasive, uncomfortable, and difficult to do if he is confused.  Following discussion, family expressed desire to continue with current interventions and reassess his situation tomorrow.  While they hope that he continues to improve, they also understand that he may decompensate moving forward.  Length of Stay: 5  Current Medications: Scheduled Meds:  . aspirin EC  81 mg Oral Daily  . budesonide (PULMICORT) nebulizer solution  0.5 mg Nebulization BID  . Chlorhexidine Gluconate Cloth  6 each Topical Daily  . heparin  5,000 Units Subcutaneous Q8H  . ipratropium-albuterol  3 mL Nebulization BID  . mouth rinse  15 mL Mouth Rinse BID  .  methylPREDNISolone (SOLU-MEDROL) injection  40 mg Intravenous Q8H  . metoprolol tartrate  25 mg Oral Q6H  . mupirocin ointment  1 application Nasal BID  . pantoprazole  40 mg Oral Daily  . simvastatin  40 mg Oral QPM  . sodium chloride flush  3 mL Intravenous Q12H  . tamsulosin  0.4 mg Oral Daily  . vancomycin  500 mg Oral Q6H    Continuous Infusions: . dextrose 5 % with kcl 75 mL/hr at 01/01/21 1814  . metronidazole Stopped (01/01/21 1351)    PRN Meds: acetaminophen, albuterol, haloperidol lactate, LORazepam, methocarbamol, metoprolol  tartrate, ondansetron (ZOFRAN) IV  Physical Exam         General: Alert, awake, resting comfortably today but still has no insight into his condition HEENT: No bruits, no goiter, no JVD Heart: Rate controlled currently. Lungs: Good air movement Abdomen: Soft, globally tender, distended, positive bowel sounds.  Ext: No significant edema Skin: Warm and dry   Vital Signs: BP (!) 145/79   Pulse 89   Temp 97.8 F (36.6 C) (Axillary)   Resp 17   Ht _0  (1.702 m)   Wt 73.3 kg   SpO2 91%   BMI 25.31 kg/m  SpO2: SpO2: 91 % O2 Device: O2 Device: Nasal Cannula O2 Flow Rate: O2 Flow Rate (L/min): 1 L/min  Intake/output summary:   Intake/Output Summary (Last 24 hours) at 01/01/2021 1830 Last data filed at 01/01/2021 1814 Gross per 24 hour  Intake 1976.17 ml  Output 1400 ml  Net 576.17 ml   LBM: Last BM Date: 12/31/20 Baseline Weight: Weight: 71.2 kg Most recent weight: Weight: 73.3 kg       Palliative Assessment/Data:    Flowsheet Rows   Flowsheet Row Most Recent Value  Intake Tab   Referral Department Hospitalist  Unit at Time of Referral ICU  Palliative Care Primary Diagnosis Sepsis/Infectious Disease  Date Notified 12/30/20  Palliative Care Type New Palliative care  Reason for referral Clarify Goals of Care  Date of Admission 12/27/20  Date first seen by Palliative Care 12/31/20  # of days Palliative referral response time  1 Day(s)  # of days IP prior to Palliative referral 3  Clinical Assessment   Palliative Performance Scale Score 20%  Psychosocial & Spiritual Assessment   Palliative Care Outcomes   Patient/Family meeting held? Yes  Who was at the meeting? Wife, son, daughter  Palliative Care Outcomes Clarified goals of care      Patient Active Problem List   Diagnosis Date Noted  . Colitis due to Clostridioides difficile 12/31/2020  . Acute delirium 12/31/2020  . HOH (hard of hearing) 12/28/2020  . Acute on chronic cholecystitis 12/28/2020  . Hypokalemia 12/28/2020  . Severe sepsis ([place]) 12/27/2020  . Arrhythmia 12/06/2020  . Pulmonary nodule 12/06/2020  . Acute cholecystitis 11/26/2020  . Preventative health care 10/12/2020  . Macular degeneration, wet ([place]) 10/12/2020  . Acquired claw toe, left 02/02/2020  . BPH (benign prostatic hyperplasia) 01/13/2020  . Neuropathy 02/18/2018  . COPD (chronic obstructive pulmonary disease) ([place])   . Chronic renal disease, stage III ([place])   . Benign essential HTN 04/09/2017  . CAD (coronary artery disease) 03/03/2015  . Hyperlipidemia 03/03/2015    Palliative Care Assessment & Plan   Patient Profile: 87 y.o. male  with past medical history of CHF, COPD, choledocholithiasis, CAD status post PCI, COPD, CKD, hypertension, hyperlipidemia, BPH admitted on 12/27/2020 with sepsis.  He was recently admitted to [facility] 4/16 through 4/19 for presumed cholecystitis.  He was being followed by Dr. Redmond Pulling for cholecystectomy later this month.  Initially, there was concern that his presentation was related to cholecystitis again, however, with further work-up it appears that this is actually more from C. difficile.  His hospital course has been further complicated by A. fib, COPD exacerbation, and development of agitation/delirium.  Palliative consulted for goals of care.  Recommendations/Plan:  DNR/DNI  Continue current interventions.  Discussed plan with family  regarding reality of the situation and concern that he remains critically ill and may not improve.  Today, however, at the time of their  visit, he is more awake and alert and cooperative.  We discussed plan to continue with current interventions and reassess his situation again tomorrow. Standard delirium management (adapted from NICE guidelines 2011 for prevention of delirium):  Provide continuity of care when possible (avoid frequent changing of surroundings and staff).  Frequent reorientation to time with:  A clock should always be visible.  Make sure Calendar/white board is updated.  Lights on/blinds open during the day and off/closed at night.  Encourage frequent family visits.  Monitor for and treat dehydration/constipation.  Optimize oxygen saturation.  Avoid catheters and IV's when possible and look for/treat infections.  Encourage early mobility.  Assess and treat pain.  Ensure adequate nutrition and functioning dentures.  Address reversible causes of hearing and visual impairment:  Use pocket talker if hearing aids are unavailable.  Avoid sleep disturbance (normalize sleep/wake cycle).  Minimize disturbances and consider NOT obtaining vitals at night if possible.  Review Medications to avoid polypharmacy and avoid deliriogenic medications when possible:  Benzodiazepines.  Dihydropyridines.  Antihistamines.  Anticholinergics.  (Possibly avoid: H2 blockers, tricyclic antidepressants, antiparkinson medications, steroids, NSAID's).   Code Status:    Code Status Orders  (From admission, onward)         Start     Ordered   12/29/20 1002  Do not attempt resuscitation (DNR)  Continuous       Question Answer Comment  In the event of cardiac or respiratory ARREST Do not call a "code blue"   In the event of cardiac or respiratory ARREST Do not perform Intubation, CPR, defibrillation or ACLS   In the event of cardiac or respiratory ARREST Use medication by any route, position,  wound care, and other measures to relive pain and suffering. May use oxygen, suction and manual treatment of airway obstruction as needed for comfort.      12/29/20 1001        Code Status History    Date Active Date Inactive Code Status Order ID Comments User Context   12/27/2020 2202 12/29/2020 1001 Full Code [id number]  Lenore Cordia, MD ED   11/26/2020 1947 11/29/2020 1648 Full Code [id number]  Kayleen Memos, DO ED   Advance Care Planning Activity       Prognosis:  Guarded  Discharge Planning:  To Be Determined  Care plan was discussed with patient, wife, 2 children, RN, primary attending  Thank you for allowing the Palliative Medicine Team to assist in the care of this patient.   Time In: 1400 Time Out: 1445 Total Time 45 Prolonged Time Billed No      Greater than 50%  of this time was spent counseling and coordinating care related to the above assessment and plan.  Micheline Rough, MD  Please contact Palliative Medicine Team phone at [phone] for questions and concerns.

## 2021-01-01 NOTE — Progress Notes (Signed)
Cardiology Progress Note  Patient ID: Leon Estes MRN: [id number] DOB: [birth year] Date of Encounter: 01/01/2021  Primary Cardiologist: Mertie Moores, MD  Subjective   Chief Complaint: Confused  HPI: Maintaining sinus rhythm.  Probably in and out of A. fib.  Quite confused this morning.  Denies any chest pain or trouble breathing.  ROS:  All other ROS reviewed and negative. Pertinent positives noted in the HPI.     Inpatient Medications  Scheduled Meds: . aspirin EC  81 mg Oral Daily  . budesonide (PULMICORT) nebulizer solution  0.5 mg Nebulization BID  . Chlorhexidine Gluconate Cloth  6 each Topical Daily  . heparin  5,000 Units Subcutaneous Q8H  . ipratropium-albuterol  3 mL Nebulization Q6H  . LORazepam  1 mg Intravenous Once  . mouth rinse  15 mL Mouth Rinse BID  . methylPREDNISolone (SOLU-MEDROL) injection  60 mg Intravenous Q6H  . metoprolol tartrate  25 mg Oral Q6H  . mupirocin ointment  1 application Nasal BID  . pantoprazole  40 mg Oral Daily  . simvastatin  40 mg Oral QPM  . sodium chloride flush  3 mL Intravenous Q12H  . tamsulosin  0.4 mg Oral Daily  . vancomycin  500 mg Oral Q6H   Continuous Infusions: . dextrose 50 mL/hr at 01/01/21 0647  . metronidazole Stopped (01/01/21 0450)   PRN Meds: acetaminophen, albuterol, haloperidol lactate, methocarbamol, metoprolol tartrate, ondansetron (ZOFRAN) IV   Vital Signs   Vitals:   01/01/21 0353 01/01/21 0400 01/01/21 0651 01/01/21 0712  BP:   (!) 173/89   Pulse:   90   Resp:      Temp:  98 F (36.7 C)    TempSrc:  Axillary    SpO2:    91%  Weight: 73.3 kg     Height:        Intake/Output Summary (Last 24 hours) at 01/01/2021 0740 Last data filed at 01/01/2021 0647 Gross per 24 hour  Intake 996.55 ml  Output 1000 ml  Net -3.45 ml   Last 3 Weights 01/01/2021 12/31/2020 12/30/2020  Weight (lbs) 161 lb 9.6 oz 164 lb 3.9 oz 171 lb 4.8 oz  Weight (kg) 73.3 kg 74.5 kg 77.7 kg      Telemetry  Overnight  telemetry shows sinus rhythm with PACs, which I personally reviewed.    Physical Exam   Vitals:   01/01/21 0353 01/01/21 0400 01/01/21 0651 01/01/21 0712  BP:   (!) 173/89   Pulse:   90   Resp:      Temp:  98 F (36.7 C)    TempSrc:  Axillary    SpO2:    91%  Weight: 73.3 kg     Height:         Intake/Output Summary (Last 24 hours) at 01/01/2021 0740 Last data filed at 01/01/2021 0647 Gross per 24 hour  Intake 996.55 ml  Output 1000 ml  Net -3.45 ml    Last 3 Weights 01/01/2021 12/31/2020 12/30/2020  Weight (lbs) 161 lb 9.6 oz 164 lb 3.9 oz 171 lb 4.8 oz  Weight (kg) 73.3 kg 74.5 kg 77.7 kg    Body mass index is 25.31 kg/m.  General: Ill-appearing Head: Atraumatic, normal size  Eyes: PEERLA, EOMI  Neck: Supple, JVD 5 to 7 cm of water Endocrine: No thryomegaly Cardiac: Normal S1, S2; RRR; no murmurs, rubs, or gallops Lungs: Diminished breath sounds bilaterally Abd: Distended abdomen Ext: No edema, pulses 2+ Musculoskeletal: No deformities, BUE and BLE strength normal  and equal Skin: Warm and dry, no rashes   Neuro: Alert, awake, oriented to self only  Labs  High Sensitivity Troponin:  No results for input(s): TROPONINIHS in the last 720 hours.   Cardiac EnzymesNo results for input(s): TROPONINI in the last 168 hours. No results for input(s): TROPIPOC in the last 168 hours.  Chemistry Recent Labs  Lab 12/30/20 0751 12/31/20 0204 01/01/21 0237  NA 143 148* 149*  K 4.2 3.8 3.2*  CL 116* 117* 118*  CO2 18* 15* 24  GLUCOSE 113* 125* 184*  BUN 35* 51* 60*  CREATININE 1.24 1.47* 1.35*  CALCIUM 8.4* 9.0 9.1  PROT 6.5 6.6 6.9  ALBUMIN 3.0* 3.2* 3.4*  AST 51* 86* 93*  ALT 29 38 55*  ALKPHOS 51 62 70  BILITOT 0.7 0.9 0.7  GFRNONAA 56* 46* 51*  ANIONGAP 9 16* 7    Hematology Recent Labs  Lab 12/30/20 0751 12/31/20 0204 01/01/21 0237  WBC 12.2* 12.0* 14.1*  RBC 3.94* 3.60* 3.79*  HGB 11.1* 10.1* 10.6*  HCT 37.0* 33.0* 34.8*  MCV 93.9 91.7 91.8  MCH 28.2  28.1 28.0  MCHC 30.0 30.6 30.5  RDW 14.6 14.6 14.9  PLT 234 232 265   BNP Recent Labs  Lab 12/27/20 1754  BNP 689.3*    DDimer No results for input(s): DDIMER in the last 168 hours.   Radiology  DG Abd 1 View  Result Date: 12/31/2020 CLINICAL DATA:  Abdominal distension EXAM: ABDOMEN - 1 VIEW COMPARISON:  12/30/2020. FINDINGS: Gaseous distension of the colon is again noted with slight increase distension of the RIGHT colon. Nondistended gas-filled small bowel loops are again noted. No other significant changes noted. IMPRESSION: Gaseous distension of the colon again noted with slight increased distention of the RIGHT colon. Electronically Signed   By: Margarette Canada M.D.   On: 12/31/2020 10:56   DG CHEST PORT 1 VIEW  Result Date: 12/31/2020 CLINICAL DATA:  Wheezing. EXAM: PORTABLE CHEST 1 VIEW COMPARISON:  12/27/2020 and prior studies FINDINGS: Cardiomegaly and mild peribronchial thickening again noted. Mild LEFT basilar atelectasis/scarring again identified. There is no evidence of focal airspace disease, pulmonary edema, suspicious pulmonary nodule/mass, pleural effusion, or pneumothorax. No acute bony abnormalities are identified. IMPRESSION: Cardiomegaly, mild chronic peribronchial thickening and mild LEFT basilar atelectasis/scarring. No evidence of acute cardiopulmonary disease. Electronically Signed   By: Margarette Canada M.D.   On: 12/31/2020 10:54   DG ABD ACUTE 2+V W 1V CHEST  Result Date: 12/30/2020 CLINICAL DATA:  Abdominal distension. Reported Clostridium difficile colitis EXAM: DG ABDOMEN ACUTE WITH 1 VIEW CHEST COMPARISON:  CT abdomen and pelvis Dec 27, 2020. Chest radiograph Dec 27, 2020 FINDINGS: PA chest: There is mild left base atelectasis. The lungs elsewhere are clear. Heart is mildly enlarged with pulmonary vascularity normal. No appreciable adenopathy. Supine and upright abdomen: There are loops of mildly dilated colon. No air-fluid levels. No appreciable small bowel  dilatation. No air-fluid levels. No free air. No abnormal calcifications. IMPRESSION: Question a degree of colonic ileus or colitis. No obstructing focus evident. No free air. Left base atelectasis. Lungs elsewhere clear. Mild cardiac enlargement. Electronically Signed   By: Lowella Grip III M.D.   On: 12/30/2020 09:41   ECHOCARDIOGRAM COMPLETE  Result Date: 12/30/2020    ECHOCARDIOGRAM REPORT   Patient Name:   Leon Estes Date of Exam: 12/30/2020 Medical Rec #:  [id number]        Height:       67.0 in Accession #:  [id number]       Weight:       171.3 lb Date of Birth:  10-29-1930         BSA:          1.893 m Patient Age:    43 years         BP:           160/57 mmHg Patient Gender: M                HR:           110 bpm. Exam Location:  Inpatient Procedure: 2D Echo, Cardiac Doppler and Color Doppler Indications:    Atrial fibrillation  History:        Patient has no prior history of Echocardiogram examinations.                 CAD, COPD, Arrythmias:Atrial Fibrillation,                 Signs/Symptoms:Altered Mental Status and Sepsis; Risk                 Factors:Dyslipidemia and Hypertension.  Sonographer:    Dustin Flock Referring Phys: [id number] [place]  1. Left ventricular ejection fraction, by estimation, is 55 to 60%. The left ventricle has normal function. The left ventricle has no regional wall motion abnormalities. There is mild concentric left ventricular hypertrophy. Diastolic function indeterminant due to atrial fibrillation.  2. Right ventricular systolic function is normal. The right ventricular size is normal. There is mildly elevated pulmonary artery systolic pressure.  3. Left atrial size was mildly dilated.  4. Right atrial size was mildly dilated.  5. The mitral valve is normal in structure. Trivial mitral valve regurgitation. No evidence of mitral stenosis.  6. The aortic valve is tricuspid. There is mild calcification of the aortic valve. There is mild  thickening of the aortic valve. Aortic valve regurgitation is not visualized. Mild aortic valve sclerosis is present, with no evidence of aortic valve stenosis.  7. The inferior vena cava is normal in size with greater than 50% respiratory variability, suggesting right atrial pressure of 3 mmHg. Comparison(s): No prior Echocardiogram. FINDINGS  Left Ventricle: Left ventricular ejection fraction, by estimation, is 55 to 60%. The left ventricle has normal function. The left ventricle has no regional wall motion abnormalities. The left ventricular internal cavity size was normal in size. There is  mild concentric left ventricular hypertrophy. Diastolic function indeterminant due to atrial fibrillation. Right Ventricle: The right ventricular size is normal. Right vetricular wall thickness was not well visualized. Right ventricular systolic function is normal. There is mildly elevated pulmonary artery systolic pressure. The tricuspid regurgitant velocity  is 3.01 m/s, and with an assumed right atrial pressure of 3 mmHg, the estimated right ventricular systolic pressure is 68.1 mmHg. Left Atrium: Left atrial size was mildly dilated. Right Atrium: Right atrial size was mildly dilated. Pericardium: There is no evidence of pericardial effusion. Mitral Valve: The mitral valve is normal in structure. Trivial mitral valve regurgitation. No evidence of mitral valve stenosis. Tricuspid Valve: The tricuspid valve is normal in structure. Tricuspid valve regurgitation is trivial. Aortic Valve: The aortic valve is tricuspid. There is mild calcification of the aortic valve. There is mild thickening of the aortic valve. Aortic valve regurgitation is not visualized. Mild aortic valve sclerosis is present, with no evidence of aortic valve stenosis. Pulmonic Valve: The pulmonic valve was not well visualized. Pulmonic valve regurgitation is  trivial. Aorta: The aortic root is normal in size and structure. Venous: The inferior vena cava is  normal in size with greater than 50% respiratory variability, suggesting right atrial pressure of 3 mmHg. IAS/Shunts: No atrial level shunt detected by color flow Doppler.  LEFT VENTRICLE PLAX 2D LVIDd:         5.40 cm  Diastology LVIDs:         3.70 cm  LV e' medial:    6.31 cm/s LV PW:         1.30 cm  LV E/e' medial:  18.7 LV IVS:        1.30 cm  LV e' lateral:   10.90 cm/s LVOT diam:     2.50 cm  LV E/e' lateral: 10.8 LV SV:         75 LV SV Index:   40 LVOT Area:     4.91 cm  RIGHT VENTRICLE RV Basal diam:  3.00 cm RV S prime:     10.70 cm/s TAPSE (M-mode): 2.1 cm LEFT ATRIUM             Index       RIGHT ATRIUM           Index LA diam:        5.40 cm 2.85 cm/m  RA Area:     24.20 cm LA Vol (A2C):   54.4 ml 28.73 ml/m RA Volume:   76.70 ml  40.51 ml/m LA Vol (A4C):   75.6 ml 39.93 ml/m LA Biplane Vol: 69.7 ml 36.81 ml/m  AORTIC VALVE LVOT Vmax:   86.70 cm/s LVOT Vmean:  59.700 cm/s LVOT VTI:    0.153 m  AORTA Ao Root diam: 3.60 cm MITRAL VALVE                TRICUSPID VALVE MV Area (PHT): 6.02 cm     TR Peak grad:   36.2 mmHg MV Decel Time: 126 msec     TR Vmax:        301.00 cm/s MV E velocity: 118.00 cm/s                             SHUNTS                             Systemic VTI:  0.15 m                             Systemic Diam: 2.50 cm Gwyndolyn Kaufman MD Electronically signed by Gwyndolyn Kaufman MD Signature Date/Time: 12/30/2020/3:00:41 PM    Final     Cardiac Studies  TTE 12/30/2020 1. Left ventricular ejection fraction, by estimation, is 55 to 60%. The  left ventricle has normal function. The left ventricle has no regional  wall motion abnormalities. There is mild concentric left ventricular  hypertrophy. Diastolic function  indeterminant due to atrial fibrillation.  2. Right ventricular systolic function is normal. The right ventricular  size is normal. There is mildly elevated pulmonary artery systolic  pressure.  3. Left atrial size was mildly dilated.  4. Right atrial size  was mildly dilated.  5. The mitral valve is normal in structure. Trivial mitral valve  regurgitation. No evidence of mitral stenosis.  6. The aortic valve is tricuspid. There is mild calcification of the  aortic valve. There is mild  thickening of the aortic valve. Aortic valve  regurgitation is not visualized. Mild aortic valve sclerosis is present,  with no evidence of aortic valve  stenosis.  7. The inferior vena cava is normal in size with greater than 50%  respiratory variability, suggesting right atrial pressure of 3 mmHg.   Patient Profile   87 year old male with history of CAD status post prior PCI, hypertension, hyperlipidemia, COPD who was admitted with sepsis secondary to acute cholecystitis on 12/27/2020.  Course complicated by [name].Cardiology was consulted for atrial fibrillation.  He was moved to the ICU on 12/30/2020 for worsening mentation and A. fib with RVR.  Assessment & Plan   1.  Paroxysmal A. Fib -Predominantly in sinus now.  -A. fib is driven by sepsis. -Echo with normal LV function. -We will continue metoprolol tartrate for now. -Would recommend to start heparin drip if okay from surgical standpoint.  He is very high risk for bleeding.  He has [name] as well as acute cholecystitis. -I do agree with palliative care.  I do not think you will recover from this. -If he pursues palliative care and hospice anticoagulation would not be necessary.  [facility] will sign off.   Medication Recommendations: Rate control as above.  Start anticoagulation once able. Other recommendations (labs, testing, etc): None Follow up as an outpatient: He may keep regular follow-up appointments.  It appears he is likely heading towards hospice and palliative care.  For questions or updates, please contact [place] Please consult [email or web address] for contact info under   Time Spent with Patient: I have spent a total of 25 minutes with patient  reviewing hospital notes, telemetry, EKGs, labs and examining the patient as well as establishing an assessment and plan that was discussed with the patient.  > 50% of time was spent in direct patient care.    Signed, Addison Naegeli. Audie Box, MD, [place]  01/01/2021 7:40 AM

## 2021-01-01 NOTE — Progress Notes (Addendum)
Patient ID: Leon Estes, male   DOB: 28-Mar-1931, 87 y.o.   MRN: [id number]  PROGRESS NOTE    Leon Estes  [id number] DOB: 1931/05/06 DOA: 12/27/2020 PCP: Venia Carbon, MD   Brief Narrative:  87 year old male with history of CAD status post stenting to RCA in 1997, COPD, CKD stage III, hypertension, hyperlipidemia, BPH, recent admission at [facility] from 11/26/2020-11/29/2020 with presumed acute cholecystitis (RUQ ultrasound and CT abdomen/pelvis were equivocal.  HIDA scan was consistent with cystic duct obstruction) treated medically with antibiotics and he was followed up by general surgery as an outpatient with plans for laparoscopic cholecystectomy on 01/10/2021.  He presented on 12/27/2020 with fever and generalized weakness.  On presentation, temperature was 103.1 F rectally along with tachypnea and tachycardia, WBC of 15.3, creatinine of 1.48, normal LFTs, lactic acid of 2.8 and then 3.2 and BNP of 689.3.  COVID-19 and influenza tests were negative.  Chest x-ray was negative for focal consolidation. CT abdomen/pelvis without contrast showed prostatomegaly, stable appearance of the gallbladder with mild stable central intrahepatic biliary dilatation, and extensive chronic and degenerative changes within the lower lumbar spine.  He was started on IV fluids and antibiotics.  General surgery was consulted.  Initially plan was for surgical intervention but it was postponed once stool tested positive for C. difficile and patient was started on oral vancomycin.  Hospital course has been complicated by delirium/agitation along with A. fib with RVR.  Cardiology and palliative care have also been consulted.  Assessment & Plan:   Severe sepsis, present on admission  [name] Leukocytosis: Resolved ?  Acute on chronic cholecystitis Elevated LFTs -Patient was recently hospitalized at [facility] with presumed cholecystitis treated with antibiotics with outpatient follow-up with general surgery  with plans for laparoscopic cholecystectomy on 01/10/2021. -Presented with temperature of 102.1 F rectally along with tachypnea, tachycardia, WBC of 15.3, elevated lactic acid.  No clear-cut source of infection with negative chest x-ray. COVID-19 and influenza tests were negative.  - CT abdomen/pelvis showed stable appearance of the gallbladder with mild stable central intrahepatic biliary dilatation.  Right upper quadrant ultrasound shows cholelithiasis without acute cholecystitis.  LFTs are trending upwards. -WBCs worsening to 14.1 today. -General surgery initially were planning for surgical intervention on 12/28/2020 but surgery was postponed because of C. difficile.  -Repeat x-ray of abdomen on 12/31/2020 showed a increased distention of right colon.  Currently on oral vancomycin and IV Flagyl. -General surgery now following peripherally.  Paroxysmal A. fib with RVR -Not on anticoagulation as an outpatient.  -Rate better controlled currently.  Cardiology following.  Continue oral metoprolol.  Use as needed IV metoprolol. -Echo as below shows EF of 50 to 55%  Delirium/confusion -Still intermittently getting very agitated requiring Haldol.  Currently very confused and not able to take orally. -vitamin [id number], folic acid and TSH levels normal  CAD status post PCI -Currently stable.  No chest pain.  Continue aspirin, metoprolol, simvastatin.  Outpatient follow-up with cardiology  COPD Acute hypoxic respiratory failure -Improving.  Currently on 1 L oxygen via nasal cannula. -Decrease Solu-Medrol to 40 mg IV every 8 hours.  Continue nebs and start Pulmicort.  Hypokalemia -Replace.  Repeat a.m. labs  Hypernatremia -Possibly from dehydration.  Sodium 149 today.  Continue gentle hydration with D5.  Chronic kidney disease stage IIIa -Creatinine slightly improving to 1.25 today.  Monitor  Hypertension: -Blood pressure on the high side.  Continue increased dose of  Toprol  Hyperlipidemia: -Continue simvastatin.  BPH: -Continue tamsulosin.  Generalized deconditioning -will need PT eval once more stable. -Palliative care following.  Overall prognosis looks very poor at this time.   -Patient still very agitated and confused and not safe for oral intake.  Condition is getting worse with worsening abdominal distention. -Palliative care is supposed to meet with family later again today this afternoon.  Recommend comfort measures/hospice.  DVT prophylaxis: Subcutaneous heparin Code Status: DNR Family Communication: Spoke to wife and daughter at bedside on 12/30/2020 Disposition Plan: Status is: Inpatient  Remains inpatient appropriate because:Inpatient level of care appropriate due to severity of illness   Dispo: The patient is from: Home              Anticipated d/c is to: Home              Patient currently is not medically stable to d/c.   Difficult to place patient No  Consultants: General surgery/cardiology/palliative care  Procedures: Echo  Antimicrobials:  Anti-infectives (From admission, onward)   Start     Dose/Rate Route Frequency Ordered Stop   12/30/20 1200  vancomycin (VANCOCIN) 50 mg/mL oral solution 500 mg        500 mg Oral Every 6 hours 12/30/20 0956     12/30/20 1200  metroNIDAZOLE (FLAGYL) IVPB 500 mg        500 mg 100 mL/hr over 60 Minutes Intravenous Every 8 hours 12/30/20 0956     12/29/20 0600  vancomycin (VANCOREADY) IVPB 1250 mg/250 mL  Status:  Discontinued        1,250 mg 166.7 mL/hr over 90 Minutes Intravenous Every 36 hours 12/27/20 2315 12/28/20 1007   12/28/20 2000  vancomycin (VANCOCIN) 50 mg/mL oral solution 125 mg  Status:  Discontinued        125 mg Oral Every 6 hours 12/28/20 1801 12/30/20 0956   12/28/20 0830  clindamycin (CLEOCIN) IVPB 900 mg  Status:  Discontinued       "And" Linked Group Details   900 mg 100 mL/hr over 30 Minutes Intravenous On call to O.R. 12/28/20 [id number] 12/29/20 0559   12/28/20  0830  gentamicin (GARAMYCIN) 360 mg in dextrose 5 % 100 mL IVPB  Status:  Discontinued       "And" Linked Group Details   5 mg/kg  71.2 kg 109 mL/hr over 60 Minutes Intravenous On call to O.R. 12/28/20 [id number] 12/29/20 0559   12/28/20 0600  ceFEPIme (MAXIPIME) 2 g in sodium chloride 0.9 % 100 mL IVPB  Status:  Discontinued        2 g 200 mL/hr over 30 Minutes Intravenous Every 12 hours 12/27/20 2315 12/29/20 0955   12/28/20 0200  metroNIDAZOLE (FLAGYL) IVPB 500 mg  Status:  Discontinued        500 mg 100 mL/hr over 60 Minutes Intravenous Every 8 hours 12/27/20 2202 12/29/20 0955   12/27/20 1830  aztreonam (AZACTAM) 2 g in sodium chloride 0.9 % 100 mL IVPB  Status:  Discontinued        2 g 200 mL/hr over 30 Minutes Intravenous  Once 12/27/20 1820 12/27/20 1823   12/27/20 1830  metroNIDAZOLE (FLAGYL) IVPB 500 mg        500 mg 100 mL/hr over 60 Minutes Intravenous  Once 12/27/20 1820 12/27/20 1935   12/27/20 1830  vancomycin (VANCOCIN) IVPB 1000 mg/200 mL premix        1,000 mg 200 mL/hr over 60 Minutes Intravenous  Once 12/27/20 1820 12/27/20 1934   12/27/20 1830  ceFEPIme (MAXIPIME) 2  g in sodium chloride 0.9 % 100 mL IVPB        2 g 200 mL/hr over 30 Minutes Intravenous  Once 12/27/20 1824 12/27/20 1903       Subjective: Patient seen and examined at bedside.  Still remains confused.  Required IV Haldol overnight for severe agitation.  No overnight fever or vomiting reported. Objective: Vitals:   01/01/21 0353 01/01/21 0400 01/01/21 0651 01/01/21 0712  BP:   (!) 173/89   Pulse:   90   Resp:      Temp:  98 F (36.7 C)    TempSrc:  Axillary    SpO2:    91%  Weight: 73.3 kg     Height:        Intake/Output Summary (Last 24 hours) at 01/01/2021 0736 Last data filed at 01/01/2021 0647 Gross per 24 hour  Intake 996.55 ml  Output 1000 ml  Net -3.45 ml   Filed Weights   12/30/20 0500 12/31/20 0343 01/01/21 0353  Weight: 77.7 kg 74.5 kg 73.3 kg    Examination:  General exam:  Looks chronically ill and deconditioned.  Currently on 1 L oxygen by nasal cannula.  Elderly male lying in bed. Respiratory system: Bilateral decreased breath sounds at bases with some crackles and wheezing  cardiovascular system: S1-S2 heard, currently rate controlled gastrointestinal system: Abdomen is slightly more distended, soft and mildly tender diffusely.  Sluggish bowel sounds extremities: Trace lower extremity edema present; no clubbing Central nervous system: Extremely drowsy.  No focal neurological deficits.  Moving all extremities  skin: No obvious petechiae/rashes Psychiatry: Could not be assessed because of mental status   Data Reviewed: I have personally reviewed following labs and imaging studies  CBC: Recent Labs  Lab 12/27/20 1752 12/28/20 0514 12/29/20 0256 12/30/20 0751 12/31/20 0204 01/01/21 0237  WBC 15.3* 7.8 7.5 12.2* 12.0* 14.1*  NEUTROABS 11.2*  --  6.7 9.2* 10.3* 12.1*  HGB 12.9* 10.5* 11.1* 11.1* 10.1* 10.6*  HCT 42.1 34.7* 36.0* 37.0* 33.0* 34.8*  MCV 93.1 94.6 94.0 93.9 91.7 91.8  PLT 238 174 164 234 232 856   Basic Metabolic Panel: Recent Labs  Lab 12/28/20 0514 12/29/20 0256 12/30/20 0751 12/31/20 0204 01/01/21 0237  NA 139 141 143 148* 149*  K 3.2* 4.1 4.2 3.8 3.2*  CL 110 116* 116* 117* 118*  CO2 19* 19* 18* 15* 24  GLUCOSE 115* 130* 113* 125* 184*  BUN 26* 37* 35* 51* 60*  CREATININE 1.40* 1.34* 1.24 1.47* 1.35*  CALCIUM 7.9* 7.8* 8.4* 9.0 9.1  MG  --  2.1 2.2 2.4 2.4   GFR: Estimated Creatinine Clearance: 36 mL/min (A) (by C-G formula based on SCr of 1.35 mg/dL (H)). Liver Function Tests: Recent Labs  Lab 12/28/20 0514 12/29/20 0256 12/30/20 0751 12/31/20 0204 01/01/21 0237  AST 31 50* 51* 86* 93*  ALT 17 22 29  38 55*  ALKPHOS 45 43 51 62 70  BILITOT 0.5 0.4 0.7 0.9 0.7  PROT 5.3* 5.6* 6.5 6.6 6.9  ALBUMIN 2.7* 2.8* 3.0* 3.2* 3.4*   Recent Labs  Lab 12/27/20 1752  LIPASE 22   Recent Labs  Lab 12/30/20 0751   AMMONIA 14   Coagulation Profile: Recent Labs  Lab 12/27/20 1752  INR 1.3*   Cardiac Enzymes: No results for input(s): CKTOTAL, CKMB, CKMBINDEX, TROPONINI in the last 168 hours. BNP (last 3 results) No results for input(s): PROBNP in the last 8760 hours. HbA1C: No results for input(s): HGBA1C in the last  72 hours. CBG: No results for input(s): GLUCAP in the last 168 hours. Lipid Profile: No results for input(s): CHOL, HDL, LDLCALC, TRIG, CHOLHDL, LDLDIRECT in the last 72 hours. Thyroid Function Tests: Recent Labs    12/31/20 0204  TSH 0.442   Anemia Panel: Recent Labs    12/31/20 0204  [id number]  FOLATE 16.6   Sepsis Labs: Recent Labs  Lab 12/27/20 1752 12/27/20 1952 12/27/20 2132 12/28/20 0514  PROCALCITON  --   --   --  29.44  LATICACIDVEN 3.2* 2.8* 2.5*  --     Recent Results (from the past 240 hour(s))  Blood Culture (routine x 2)     Status: None (Preliminary result)   Collection Time: 12/27/20  5:52 PM   Specimen: BLOOD  Result Value Ref Range Status   Specimen Description   Final    BLOOD SITE NOT SPECIFIED Performed at [facility] [address], [place], [place] [address]    Special Requests   Final    BOTTLES DRAWN AEROBIC AND ANAEROBIC Blood Culture adequate volume Performed at [facility] [address], [place], [place] [address]    Culture   Final    NO GROWTH 3 DAYS Performed at [facility], [place] [address], [place], [place] [address]    Report Status PENDING  Incomplete  Blood Culture (routine x 2)     Status: None (Preliminary result)   Collection Time: 12/27/20  6:19 PM   Specimen: BLOOD  Result Value Ref Range Status   Specimen Description   Final    BLOOD LEFT ANTECUBITAL Performed at [facility] [address], [place], [place] [address]    Special Requests   Final    BOTTLES DRAWN AEROBIC AND ANAEROBIC Blood Culture results may not be optimal due to an inadequate  volume of blood received in culture bottles Performed at [facility] [address], [place], [place] [address]    Culture   Final    NO GROWTH 3 DAYS Performed at [facility], [place] [address], [place],  [address]    Report Status PENDING  Incomplete  Resp Panel by RT-PCR (Flu A&B, Covid) Nasopharyngeal Swab     Status: None   Collection Time: 12/27/20  6:21 PM   Specimen: Nasopharyngeal Swab; Nasopharyngeal(NP) swabs in vial transport medium  Result Value Ref Range Status   SARS Coronavirus 2 by RT PCR NEGATIVE NEGATIVE Final    Comment: (NOTE) SARS-CoV-2 target nucleic acids are NOT DETECTED.  The SARS-CoV-2 RNA is generally detectable in upper respiratory specimens during the acute phase of infection. The lowest concentration of SARS-CoV-2 viral copies this assay can detect is 138 copies/mL. A negative result does not preclude SARS-Cov-2 infection and should not be used as the sole basis for treatment or other patient management decisions. A negative result may occur with  improper specimen collection/handling, submission of specimen other than nasopharyngeal swab, presence of viral mutation(s) within the areas targeted by this assay, and inadequate number of viral copies(<138 copies/mL). A negative result must be combined with clinical observations, patient history, and epidemiological information. The expected result is Negative.  Fact Sheet for Patients:  EntrepreneurPulse.com.au  Fact Sheet for Healthcare Providers:  IncredibleEmployment.be  This test is no t yet approved or cleared by the [place] FDA and  has been authorized for detection and/or diagnosis of SARS-CoV-2 by FDA under an Emergency Use Authorization (EUA). This EUA will remain  in effect (meaning this test can be used) for the duration of the COVID-19 declaration under Section 564(b)(1) of the Act, 21 U.S.C.section 360bbb-3(b)(1),  unless the authorization is terminated  or revoked sooner.       Influenza A by PCR NEGATIVE NEGATIVE Final   Influenza B by PCR NEGATIVE NEGATIVE Final    Comment: (NOTE) The Xpert Xpress SARS-CoV-2/FLU/RSV plus assay is intended as an aid in the diagnosis of influenza from Nasopharyngeal swab specimens and should not be used as a sole basis for treatment. Nasal washings and aspirates are unacceptable for Xpert Xpress SARS-CoV-2/FLU/RSV testing.  Fact Sheet for Patients: EntrepreneurPulse.com.au  Fact Sheet for Healthcare Providers: IncredibleEmployment.be  This test is not yet approved or cleared by the [place] FDA and has been authorized for detection and/or diagnosis of SARS-CoV-2 by FDA under an Emergency Use Authorization (EUA). This EUA will remain in effect (meaning this test can be used) for the duration of the COVID-19 declaration under Section 564(b)(1) of the Act, 21 U.S.C. section 360bbb-3(b)(1), unless the authorization is terminated or revoked.  Performed at [facility], [place] [address] [place], [place] [address]   Urine culture     Status: Abnormal   Collection Time: 12/27/20 10:14 PM   Specimen: In/Out Cath Urine  Result Value Ref Range Status   Specimen Description   Final    IN/OUT CATH URINE Performed at [facility] [address], [place], [place] [address]    Special Requests   Final    NONE Performed at [facility], [place] [address], [place], [place] [address]    Culture MULTIPLE SPECIES PRESENT, SUGGEST RECOLLECTION (A)  Final   Report Status 12/29/2020 FINAL  Final  MRSA PCR Screening     Status: Abnormal   Collection Time: 12/28/20  8:46 AM   Specimen: Nasal Mucosa; Nasopharyngeal  Result Value Ref Range Status   MRSA by PCR POSITIVE (A) NEGATIVE Final    Comment:        The GeneXpert MRSA Assay (FDA approved for NASAL specimens only), is  one component of a comprehensive MRSA colonization surveillance program. It is not intended to diagnose MRSA infection nor to guide or monitor treatment for MRSA infections. RESULT CALLED TO, READ BACK BY AND VERIFIED WITH: Leida Lauth RN AT 1112 12/28/20 MULLINS,T Performed at [facility], [place] [address] [place], [place] [address]   C Difficile Quick Screen w PCR reflex     Status: Abnormal   Collection Time: 12/28/20 11:43 AM   Specimen: STOOL  Result Value Ref Range Status   C Diff antigen POSITIVE (A) NEGATIVE Final   C Diff toxin POSITIVE (A) NEGATIVE Final   C Diff interpretation Toxin producing C. difficile detected.  Final    Comment: CRITICAL RESULT CALLED TO, READ BACK BY AND VERIFIED WITH: L.CAUDLE, RN AT 1757 ON 05.18.22 BY N.THOMPSON Performed at [facility] [address], [place], [place] [address]   Gastrointestinal Panel by PCR , Stool     Status: None   Collection Time: 12/28/20 11:43 AM   Specimen: STOOL  Result Value Ref Range Status   Campylobacter species NOT DETECTED NOT DETECTED Final   Plesimonas shigelloides NOT DETECTED NOT DETECTED Final   Salmonella species NOT DETECTED NOT DETECTED Final   Yersinia enterocolitica NOT DETECTED NOT DETECTED Final   Vibrio species NOT DETECTED NOT DETECTED Final   Vibrio cholerae NOT DETECTED NOT DETECTED Final   Enteroaggregative E  coli (EAEC) NOT DETECTED NOT DETECTED Final   Enteropathogenic E coli (EPEC) NOT DETECTED NOT DETECTED Final   Enterotoxigenic E coli (ETEC) NOT DETECTED NOT DETECTED Final   Shiga like toxin producing E coli (STEC) NOT DETECTED NOT DETECTED Final   Shigella/Enteroinvasive E coli (EIEC) NOT DETECTED NOT DETECTED Final   Cryptosporidium NOT DETECTED NOT DETECTED Final   Cyclospora cayetanensis NOT DETECTED NOT DETECTED Final   Entamoeba histolytica NOT DETECTED NOT DETECTED Final   Giardia lamblia NOT DETECTED NOT DETECTED Final   Adenovirus  F40/41 NOT DETECTED NOT DETECTED Final   Astrovirus NOT DETECTED NOT DETECTED Final   Norovirus GI/GII NOT DETECTED NOT DETECTED Final   Rotavirus A NOT DETECTED NOT DETECTED Final   Sapovirus (I, II, IV, and V) NOT DETECTED NOT DETECTED Final    Comment: Performed at [facility], [address], [place], [place] [address]         Radiology Studies: DG Abd 1 View  Result Date: 12/31/2020 CLINICAL DATA:  Abdominal distension EXAM: ABDOMEN - 1 VIEW COMPARISON:  12/30/2020. FINDINGS: Gaseous distension of the colon is again noted with slight increase distension of the RIGHT colon. Nondistended gas-filled small bowel loops are again noted. No other significant changes noted. IMPRESSION: Gaseous distension of the colon again noted with slight increased distention of the RIGHT colon. Electronically Signed   By: Margarette Canada M.D.   On: 12/31/2020 10:56   DG CHEST PORT 1 VIEW  Result Date: 12/31/2020 CLINICAL DATA:  Wheezing. EXAM: PORTABLE CHEST 1 VIEW COMPARISON:  12/27/2020 and prior studies FINDINGS: Cardiomegaly and mild peribronchial thickening again noted. Mild LEFT basilar atelectasis/scarring again identified. There is no evidence of focal airspace disease, pulmonary edema, suspicious pulmonary nodule/mass, pleural effusion, or pneumothorax. No acute bony abnormalities are identified. IMPRESSION: Cardiomegaly, mild chronic peribronchial thickening and mild LEFT basilar atelectasis/scarring. No evidence of acute cardiopulmonary disease. Electronically Signed   By: Margarette Canada M.D.   On: 12/31/2020 10:54   DG ABD ACUTE 2+V W 1V CHEST  Result Date: 12/30/2020 CLINICAL DATA:  Abdominal distension. Reported Clostridium difficile colitis EXAM: DG ABDOMEN ACUTE WITH 1 VIEW CHEST COMPARISON:  CT abdomen and pelvis Dec 27, 2020. Chest radiograph Dec 27, 2020 FINDINGS: PA chest: There is mild left base atelectasis. The lungs elsewhere are clear. Heart is mildly enlarged with pulmonary  vascularity normal. No appreciable adenopathy. Supine and upright abdomen: There are loops of mildly dilated colon. No air-fluid levels. No appreciable small bowel dilatation. No air-fluid levels. No free air. No abnormal calcifications. IMPRESSION: Question a degree of colonic ileus or colitis. No obstructing focus evident. No free air. Left base atelectasis. Lungs elsewhere clear. Mild cardiac enlargement. Electronically Signed   By: Lowella Grip III M.D.   On: 12/30/2020 09:41   ECHOCARDIOGRAM COMPLETE  Result Date: 12/30/2020    ECHOCARDIOGRAM REPORT   Patient Name:   Leon Estes Date of Exam: 12/30/2020 Medical Rec #:  [id number]        Height:       67.0 in Accession #:    [id number]       Weight:       171.3 lb Date of Birth:  [birth year]         BSA:          1.893 m Patient Age:    40 years         BP:           160/57 mmHg Patient Gender:  M                HR:           110 bpm. Exam Location:  Inpatient Procedure: 2D Echo, Cardiac Doppler and Color Doppler Indications:    Atrial fibrillation  History:        Patient has no prior history of Echocardiogram examinations.                 CAD, COPD, Arrythmias:Atrial Fibrillation,                 Signs/Symptoms:Altered Mental Status and Sepsis; Risk                 Factors:Dyslipidemia and Hypertension.  Sonographer:    Dustin Flock Referring Phys: [id number] [place]  1. Left ventricular ejection fraction, by estimation, is 55 to 60%. The left ventricle has normal function. The left ventricle has no regional wall motion abnormalities. There is mild concentric left ventricular hypertrophy. Diastolic function indeterminant due to atrial fibrillation.  2. Right ventricular systolic function is normal. The right ventricular size is normal. There is mildly elevated pulmonary artery systolic pressure.  3. Left atrial size was mildly dilated.  4. Right atrial size was mildly dilated.  5. The mitral valve is normal in structure.  Trivial mitral valve regurgitation. No evidence of mitral stenosis.  6. The aortic valve is tricuspid. There is mild calcification of the aortic valve. There is mild thickening of the aortic valve. Aortic valve regurgitation is not visualized. Mild aortic valve sclerosis is present, with no evidence of aortic valve stenosis.  7. The inferior vena cava is normal in size with greater than 50% respiratory variability, suggesting right atrial pressure of 3 mmHg. Comparison(s): No prior Echocardiogram. FINDINGS  Left Ventricle: Left ventricular ejection fraction, by estimation, is 55 to 60%. The left ventricle has normal function. The left ventricle has no regional wall motion abnormalities. The left ventricular internal cavity size was normal in size. There is  mild concentric left ventricular hypertrophy. Diastolic function indeterminant due to atrial fibrillation. Right Ventricle: The right ventricular size is normal. Right vetricular wall thickness was not well visualized. Right ventricular systolic function is normal. There is mildly elevated pulmonary artery systolic pressure. The tricuspid regurgitant velocity  is 3.01 m/s, and with an assumed right atrial pressure of 3 mmHg, the estimated right ventricular systolic pressure is 18.5 mmHg. Left Atrium: Left atrial size was mildly dilated. Right Atrium: Right atrial size was mildly dilated. Pericardium: There is no evidence of pericardial effusion. Mitral Valve: The mitral valve is normal in structure. Trivial mitral valve regurgitation. No evidence of mitral valve stenosis. Tricuspid Valve: The tricuspid valve is normal in structure. Tricuspid valve regurgitation is trivial. Aortic Valve: The aortic valve is tricuspid. There is mild calcification of the aortic valve. There is mild thickening of the aortic valve. Aortic valve regurgitation is not visualized. Mild aortic valve sclerosis is present, with no evidence of aortic valve stenosis. Pulmonic Valve: The  pulmonic valve was not well visualized. Pulmonic valve regurgitation is trivial. Aorta: The aortic root is normal in size and structure. Venous: The inferior vena cava is normal in size with greater than 50% respiratory variability, suggesting right atrial pressure of 3 mmHg. IAS/Shunts: No atrial level shunt detected by color flow Doppler.  LEFT VENTRICLE PLAX 2D LVIDd:         5.40 cm  Diastology LVIDs:  3.70 cm  LV e' medial:    6.31 cm/s LV PW:         1.30 cm  LV E/e' medial:  18.7 LV IVS:        1.30 cm  LV e' lateral:   10.90 cm/s LVOT diam:     2.50 cm  LV E/e' lateral: 10.8 LV SV:         75 LV SV Index:   40 LVOT Area:     4.91 cm  RIGHT VENTRICLE RV Basal diam:  3.00 cm RV S prime:     10.70 cm/s TAPSE (M-mode): 2.1 cm LEFT ATRIUM             Index       RIGHT ATRIUM           Index LA diam:        5.40 cm 2.85 cm/m  RA Area:     24.20 cm LA Vol (A2C):   54.4 ml 28.73 ml/m RA Volume:   76.70 ml  40.51 ml/m LA Vol (A4C):   75.6 ml 39.93 ml/m LA Biplane Vol: 69.7 ml 36.81 ml/m  AORTIC VALVE LVOT Vmax:   86.70 cm/s LVOT Vmean:  59.700 cm/s LVOT VTI:    0.153 m  AORTA Ao Root diam: 3.60 cm MITRAL VALVE                TRICUSPID VALVE MV Area (PHT): 6.02 cm     TR Peak grad:   36.2 mmHg MV Decel Time: 126 msec     TR Vmax:        301.00 cm/s MV E velocity: 118.00 cm/s                             SHUNTS                             Systemic VTI:  0.15 m                             Systemic Diam: 2.50 cm Gwyndolyn Kaufman MD Electronically signed by Gwyndolyn Kaufman MD Signature Date/Time: 12/30/2020/3:00:41 PM    Final         Scheduled Meds: . aspirin EC  81 mg Oral Daily  . budesonide (PULMICORT) nebulizer solution  0.5 mg Nebulization BID  . Chlorhexidine Gluconate Cloth  6 each Topical Daily  . heparin  5,000 Units Subcutaneous Q8H  . ipratropium-albuterol  3 mL Nebulization Q6H  . LORazepam  1 mg Intravenous Once  . mouth rinse  15 mL Mouth Rinse BID  . methylPREDNISolone  (SOLU-MEDROL) injection  60 mg Intravenous Q6H  . metoprolol tartrate  25 mg Oral Q6H  . mupirocin ointment  1 application Nasal BID  . pantoprazole  40 mg Oral Daily  . simvastatin  40 mg Oral QPM  . sodium chloride flush  3 mL Intravenous Q12H  . tamsulosin  0.4 mg Oral Daily  . vancomycin  500 mg Oral Q6H   Continuous Infusions: . dextrose 50 mL/hr at 01/01/21 0647  . metronidazole Stopped (01/01/21 0450)          Aline August, MD [facility] 01/01/2021, 7:36 AM

## 2021-01-01 NOTE — Progress Notes (Signed)
Patient was agitated and aggressive when RN attempted to administer oral medications. Patient physically refused to take oral medications. Physician notified of patients refusal.

## 2021-01-02 ENCOUNTER — Inpatient Hospital Stay (HOSPITAL_COMMUNITY): Payer: PPO

## 2021-01-02 ENCOUNTER — Encounter (HOSPITAL_COMMUNITY): Payer: Self-pay | Admitting: Internal Medicine

## 2021-01-02 DIAGNOSIS — A0472 Enterocolitis due to Clostridium difficile, not specified as recurrent: Secondary | ICD-10-CM | POA: Diagnosis not present

## 2021-01-02 DIAGNOSIS — N1831 Chronic kidney disease, stage 3a: Secondary | ICD-10-CM | POA: Diagnosis not present

## 2021-01-02 DIAGNOSIS — R140 Abdominal distension (gaseous): Secondary | ICD-10-CM | POA: Diagnosis not present

## 2021-01-02 DIAGNOSIS — K812 Acute cholecystitis with chronic cholecystitis: Secondary | ICD-10-CM | POA: Diagnosis not present

## 2021-01-02 DIAGNOSIS — R410 Disorientation, unspecified: Secondary | ICD-10-CM | POA: Diagnosis not present

## 2021-01-02 DIAGNOSIS — Z7189 Other specified counseling: Secondary | ICD-10-CM | POA: Diagnosis not present

## 2021-01-02 LAB — MAGNESIUM
Magnesium: 2 mg/dL (ref 1.7–2.4)
Magnesium: 2.1 mg/dL (ref 1.7–2.4)

## 2021-01-02 LAB — CBC WITH DIFFERENTIAL/PLATELET
Abs Immature Granulocytes: 0.13 10*3/uL — ABNORMAL HIGH (ref 0.00–0.07)
Basophils Absolute: 0 10*3/uL (ref 0.0–0.1)
Basophils Relative: 0 %
Eosinophils Absolute: 0 10*3/uL (ref 0.0–0.5)
Eosinophils Relative: 0 %
HCT: 33.5 % — ABNORMAL LOW (ref 39.0–52.0)
Hemoglobin: 10.6 g/dL — ABNORMAL LOW (ref 13.0–17.0)
Immature Granulocytes: 2 %
Lymphocytes Relative: 5 %
Lymphs Abs: 0.4 10*3/uL — ABNORMAL LOW (ref 0.7–4.0)
MCH: 28.3 pg (ref 26.0–34.0)
MCHC: 31.6 g/dL (ref 30.0–36.0)
MCV: 89.6 fL (ref 80.0–100.0)
Monocytes Absolute: 0.7 10*3/uL (ref 0.1–1.0)
Monocytes Relative: 9 %
Neutro Abs: 7.3 10*3/uL (ref 1.7–7.7)
Neutrophils Relative %: 84 %
Platelets: 265 10*3/uL (ref 150–400)
RBC: 3.74 MIL/uL — ABNORMAL LOW (ref 4.22–5.81)
RDW: 14.6 % (ref 11.5–15.5)
WBC: 8.6 10*3/uL (ref 4.0–10.5)
nRBC: 0 % (ref 0.0–0.2)

## 2021-01-02 LAB — COMPREHENSIVE METABOLIC PANEL
ALT: 54 U/L — ABNORMAL HIGH (ref 0–44)
AST: 45 U/L — ABNORMAL HIGH (ref 15–41)
Albumin: 3.1 g/dL — ABNORMAL LOW (ref 3.5–5.0)
Alkaline Phosphatase: 55 U/L (ref 38–126)
Anion gap: 4 — ABNORMAL LOW (ref 5–15)
BUN: 42 mg/dL — ABNORMAL HIGH (ref 8–23)
CO2: 22 mmol/L (ref 22–32)
Calcium: 8.4 mg/dL — ABNORMAL LOW (ref 8.9–10.3)
Chloride: 115 mmol/L — ABNORMAL HIGH (ref 98–111)
Creatinine, Ser: 1.02 mg/dL (ref 0.61–1.24)
GFR, Estimated: >60 mL/min (ref >60)
Glucose, Bld: 246 mg/dL — ABNORMAL HIGH (ref 70–99)
Potassium: 2.5 mmol/L — CL (ref 3.5–5.1)
Sodium: 141 mmol/L (ref 135–145)
Total Bilirubin: 0.6 mg/dL (ref 0.3–1.2)
Total Protein: 6.1 g/dL — ABNORMAL LOW (ref 6.5–8.1)

## 2021-01-02 LAB — POTASSIUM: Potassium: 2.8 mmol/L — ABNORMAL LOW (ref 3.5–5.1)

## 2021-01-02 MED ORDER — HYDRALAZINE HCL 20 MG/ML IJ SOLN
INTRAMUSCULAR | Status: AC
  Administered 2021-01-02: 10 mg via INTRAVENOUS
  Filled 2021-01-02: qty 1

## 2021-01-02 MED ORDER — METHYLPREDNISOLONE SODIUM SUCC 40 MG IJ SOLR
40.0000 mg | Freq: Two times a day (BID) | INTRAMUSCULAR | Status: DC
  Administered 2021-01-02 – 2021-01-03 (×2): 40 mg via INTRAVENOUS
  Filled 2021-01-02 (×2): qty 1

## 2021-01-02 MED ORDER — POTASSIUM CHLORIDE 2 MEQ/ML IV SOLN
INTRAVENOUS | Status: AC
  Filled 2021-01-02 (×9): qty 1000

## 2021-01-02 MED ORDER — METOPROLOL TARTRATE 5 MG/5ML IV SOLN
5.0000 mg | Freq: Once | INTRAVENOUS | Status: AC
  Administered 2021-01-02: 5 mg via INTRAVENOUS
  Filled 2021-01-02: qty 5

## 2021-01-02 MED ORDER — VANCOMYCIN HCL 500 MG IV SOLR
500.0000 mg | Freq: Four times a day (QID) | Status: DC
  Administered 2021-01-02 – 2021-01-09 (×25): 500 mg via RECTAL
  Filled 2021-01-02 (×31): qty 500

## 2021-01-02 MED ORDER — POTASSIUM CHLORIDE 10 MEQ/100ML IV SOLN
10.0000 meq | INTRAVENOUS | Status: AC
  Administered 2021-01-02 (×6): 10 meq via INTRAVENOUS
  Filled 2021-01-02 (×7): qty 100

## 2021-01-02 MED ORDER — HYDRALAZINE HCL 20 MG/ML IJ SOLN: 10.0000 mg | Freq: Once | INTRAMUSCULAR | Status: AC

## 2021-01-02 NOTE — Progress Notes (Signed)
Daily Progress Note   Patient Name: Leon Estes       Date: 01/02/2021 DOB: 1930-09-15  Age: 87 y.o. MRN#: [id number] Attending Physician: Aline August, MD Primary Care Physician: Venia Carbon, MD Admit Date: 12/27/2020  Reason for Consultation/Follow-up: Establishing goals of care  Subjective: I spoke with patient's wife today.  Discussed that while he reports feeling the same, concern with what appears to be progression on abdominal x-ray today.    We discussed plan for CT to further characterize.    Length of Stay: 6  Current Medications: Scheduled Meds:  . aspirin EC  81 mg Oral Daily  . budesonide (PULMICORT) nebulizer solution  0.5 mg Nebulization BID  . Chlorhexidine Gluconate Cloth  6 each Topical Daily  . heparin  5,000 Units Subcutaneous Q8H  . ipratropium-albuterol  3 mL Nebulization BID  . mouth rinse  15 mL Mouth Rinse BID  . methylPREDNISolone (SOLU-MEDROL) injection  40 mg Intravenous Q12H  . metoprolol tartrate  25 mg Oral Q6H  . pantoprazole  40 mg Oral Daily  . simvastatin  40 mg Oral QPM  . sodium chloride flush  3 mL Intravenous Q12H  . tamsulosin  0.4 mg Oral Daily  . vancomycin (VANCOCIN) rectal ENEMA  500 mg Rectal Q6H    Continuous Infusions: . dextrose 5 % with kcl 75 mL/hr at 01/02/21 1821  . metronidazole Stopped (01/02/21 1241)  . potassium chloride 10 mEq (01/02/21 2047)    PRN Meds: acetaminophen, albuterol, haloperidol lactate, LORazepam, methocarbamol, metoprolol tartrate, ondansetron (ZOFRAN) IV  Physical Exam         General: Alert, awake, mildly confused HEENT: No bruits, no goiter, no JVD Heart: Rate controlled currently. Lungs: Good air movement Abdomen: Soft, globally tender, distended, positive bowel sounds.  Ext: No  significant edema Skin: Warm and dry   Vital Signs: BP (!) 170/125 (BP Location: Left Arm)   Pulse 85   Temp 98 F (36.7 C) (Oral)   Resp (!) 22   Ht 5\' 7"  (1.702 m)   Wt 71.3 kg   SpO2 99%   BMI 24.62 kg/m  SpO2: SpO2: 99 % O2 Device: O2 Device: Room Air O2 Flow Rate: O2 Flow Rate (L/min): 1 L/min  Intake/output summary:   Intake/Output Summary (Last 24 hours) at 01/02/2021 2132 Last data  filed at 01/02/2021 1821 Gross per 24 hour  Intake 2316.72 ml  Output 300 ml  Net 2016.72 ml   LBM: Last BM Date: 01/02/21 Baseline Weight: Weight: 71.2 kg Most recent weight: Weight: 71.3 kg       Palliative Assessment/Data:    Flowsheet Rows   Flowsheet Row Most Recent Value  Intake Tab   Referral Department Hospitalist  Unit at Time of Referral ICU  Palliative Care Primary Diagnosis Sepsis/Infectious Disease  Date Notified 12/30/20  Palliative Care Type New Palliative care  Reason for referral Clarify Goals of Care  Date of Admission 12/27/20  Date first seen by Palliative Care 12/31/20  # of days Palliative referral response time 1 Day(s)  # of days IP prior to Palliative referral 3  Clinical Assessment   Palliative Performance Scale Score 20%  Psychosocial & Spiritual Assessment   Palliative Care Outcomes   Patient/Family meeting held? Yes  Who was at the meeting? Wife, son, daughter  Palliative Care Outcomes Clarified goals of care      Patient Active Problem List   Diagnosis Date Noted  . Colitis due to Clostridioides difficile 12/31/2020  . Acute delirium 12/31/2020  . HOH (hard of hearing) 12/28/2020  . Acute on chronic cholecystitis 12/28/2020  . Hypokalemia 12/28/2020  . Severe sepsis ([place]) 12/27/2020  . Arrhythmia 12/06/2020  . Pulmonary nodule 12/06/2020  . Acute cholecystitis 11/26/2020  . Preventative health care 10/12/2020  . Macular degeneration, wet ([place]) 10/12/2020  . Acquired claw toe, left 02/02/2020  . BPH (benign prostatic hyperplasia)  01/13/2020  . Neuropathy 02/18/2018  . COPD (chronic obstructive pulmonary disease) ([place])   . Chronic renal disease, stage III ([place])   . Benign essential HTN 04/09/2017  . CAD (coronary artery disease) 03/03/2015  . Hyperlipidemia 03/03/2015    Palliative Care Assessment & Plan   Patient Profile: 87 y.o. male  with past medical history of CHF, COPD, choledocholithiasis, CAD status post PCI, COPD, CKD, hypertension, hyperlipidemia, BPH admitted on 12/27/2020 with sepsis.  He was recently admitted to [facility] 4/16 through 4/19 for presumed cholecystitis.  He was being followed by Dr. Redmond Pulling for cholecystectomy later this month.  Initially, there was concern that his presentation was related to cholecystitis again, however, with further work-up it appears that this is actually more from C. difficile.  His hospital course has been further complicated by A. fib, COPD exacerbation, and development of agitation/delirium.  Palliative consulted for goals of care.  Recommendations/Plan:  DNR/DNI  Continue current interventions.  Plan for CT to further characterize.   Standard delirium management (adapted from NICE guidelines 2011 for prevention of delirium):  Provide continuity of care when possible (avoid frequent changing of surroundings and staff).  Frequent reorientation to time with:  A clock should always be visible.  Make sure Calendar/white board is updated.  Lights on/blinds open during the day and off/closed at night.  Encourage frequent family visits.  Monitor for and treat dehydration/constipation.  Optimize oxygen saturation.  Avoid catheters and IV's when possible and look for/treat infections.  Encourage early mobility.  Assess and treat pain.  Ensure adequate nutrition and functioning dentures.  Address reversible causes of hearing and visual impairment:  Use pocket talker if hearing aids are unavailable.  Avoid sleep disturbance (normalize sleep/wake cycle).  Minimize  disturbances and consider NOT obtaining vitals at night if possible.  Review Medications to avoid polypharmacy and avoid deliriogenic medications when possible:  Benzodiazepines.  Dihydropyridines.  Antihistamines.  Anticholinergics.  (Possibly avoid: H2  blockers, tricyclic antidepressants, antiparkinson medications, steroids, NSAID's).   Code Status:    Code Status Orders  (From admission, onward)         Start     Ordered   12/29/20 1002  Do not attempt resuscitation (DNR)  Continuous       Question Answer Comment  In the event of cardiac or respiratory ARREST Do not call a "code blue"   In the event of cardiac or respiratory ARREST Do not perform Intubation, CPR, defibrillation or ACLS   In the event of cardiac or respiratory ARREST Use medication by any route, position, wound care, and other measures to relive pain and suffering. May use oxygen, suction and manual treatment of airway obstruction as needed for comfort.      12/29/20 1001        Code Status History    Date Active Date Inactive Code Status Order ID Comments User Context   12/27/2020 2202 12/29/2020 1001 Full Code [id number]  Lenore Cordia, MD ED   11/26/2020 1947 11/29/2020 1648 Full Code [id number]  Kayleen Memos, DO ED   Advance Care Planning Activity       Prognosis:  Guarded  Discharge Planning:  To Be Determined  Care plan was discussed with patient, wife, 2 children, RN, primary attending  Thank you for allowing the Palliative Medicine Team to assist in the care of this patient.   Total Time 25 Prolonged Time Billed No   Greater than 50%  of this time was spent counseling and coordinating care related to the above assessment and plan.  Micheline Rough, MD  Please contact Palliative Medicine Team phone at [phone] for questions and concerns.

## 2021-01-02 NOTE — Progress Notes (Signed)
Patient ID: Leon Estes, male   DOB: July 05, 1931, 87 y.o.   MRN: [id number]  PROGRESS NOTE    Leon Estes  [id number] DOB: [birth year] DOA: 12/27/2020 PCP: Venia Carbon, MD   Brief Narrative:  87 year old male with history of CAD status post stenting to RCA in 1997, COPD, CKD stage III, hypertension, hyperlipidemia, BPH, recent admission at [facility] from 11/26/2020-11/29/2020 with presumed acute cholecystitis (RUQ ultrasound and CT abdomen/pelvis were equivocal.  HIDA scan was consistent with cystic duct obstruction) treated medically with antibiotics and he was followed up by general surgery as an outpatient with plans for laparoscopic cholecystectomy on 01/10/2021.  He presented on 12/27/2020 with fever and generalized weakness.  On presentation, temperature was 103.1 F rectally along with tachypnea and tachycardia, WBC of 15.3, creatinine of 1.48, normal LFTs, lactic acid of 2.8 and then 3.2 and BNP of 689.3.  COVID-19 and influenza tests were negative.  Chest x-ray was negative for focal consolidation. CT abdomen/pelvis without contrast showed prostatomegaly, stable appearance of the gallbladder with mild stable central intrahepatic biliary dilatation, and extensive chronic and degenerative changes within the lower lumbar spine.  He was started on IV fluids and antibiotics.  General surgery was consulted.  Initially plan was for surgical intervention but it was postponed once stool tested positive for C. difficile and patient was started on oral vancomycin.  Hospital course has been complicated by delirium/agitation along with A. fib with RVR.  Cardiology and palliative care were also been consulted.  Assessment & Plan:   Severe sepsis, present on admission  [name] Leukocytosis: Resolved ?  Acute on chronic cholecystitis Elevated LFTs -Patient was recently hospitalized at [facility] with presumed cholecystitis treated with antibiotics with outpatient follow-up with general surgery  with plans for laparoscopic cholecystectomy on 01/10/2021. -Presented with temperature of 102.1 F rectally along with tachypnea, tachycardia, WBC of 15.3, elevated lactic acid.  No clear-cut source of infection with negative chest x-ray. COVID-19 and influenza tests were negative.  - CT abdomen/pelvis showed stable appearance of the gallbladder with mild stable central intrahepatic biliary dilatation.  Right upper quadrant ultrasound shows cholelithiasis without acute cholecystitis.  LFTs are trending upwards.  LFTs pending today. -WBCs pending today. -General surgery initially were planning for surgical intervention on 12/28/2020 but surgery was postponed because of C. difficile.  -Repeat x-ray of abdomen on 12/31/2020 showed increased distension of right colon.  Currently on oral vancomycin and IV Flagyl.  Repeat abdominal x-ray today. -General surgery now following peripherally.  Paroxysmal A. fib with RVR -Not on anticoagulation as an outpatient.  -Rate better controlled currently.  Cardiology signed off on 01/01/2021.  Continue oral metoprolol.  Use as needed IV metoprolol. -Echo as below shows EF of 50 to 55%  Delirium/confusion -Still intermittently getting very agitated requiring Haldol.  Apparently confusion  had improved yesterday afternoon and patient was more awake but he became more confused in the evening requiring Haldol. -vitamin [id number], folic acid and TSH levels normal  CAD status post PCI -Currently stable.  No chest pain.  Continue aspirin, metoprolol, simvastatin.  Outpatient follow-up with cardiology  COPD Acute hypoxic respiratory failure -Improving.  Currently on 1 L oxygen via nasal cannula. -Decrease Solu-Medrol to 40 mg IV every 12 hours.  Continue nebs and start Pulmicort.  Hypokalemia -Labs pending for today.  Hypernatremia -Possibly from dehydration.  Labs pending for today.  Continue gentle hydration with D5.  Chronic kidney disease stage IIIa -Labs pending  today.  Monitor  Hypertension: -Blood  pressure on the high side.  Continue increased dose of Toprol  Hyperlipidemia: -Continue simvastatin.  BPH: -Continue tamsulosin.  Generalized deconditioning -will need PT eval once more stable. -Palliative care following.  Overall prognosis looks very poor at this time.   -Palliative care following.  If condition does not improve, recommend hospice/comfort measures  DVT prophylaxis: Subcutaneous heparin Code Status: DNR Family Communication: Spoke to wife and daughter at bedside on 12/30/2020 Disposition Plan: Status is: Inpatient  Remains inpatient appropriate because:Inpatient level of care appropriate due to severity of illness   Dispo: The patient is from: Home              Anticipated d/c is to: Home              Patient currently is not medically stable to d/c.   Difficult to place patient No  Consultants: General surgery/cardiology/palliative care  Procedures: Echo  Antimicrobials:  Anti-infectives (From admission, onward)   Start     Dose/Rate Route Frequency Ordered Stop   12/30/20 1200  vancomycin (VANCOCIN) 50 mg/mL oral solution 500 mg        500 mg Oral Every 6 hours 12/30/20 0956     12/30/20 1200  metroNIDAZOLE (FLAGYL) IVPB 500 mg        500 mg 100 mL/hr over 60 Minutes Intravenous Every 8 hours 12/30/20 0956     12/29/20 0600  vancomycin (VANCOREADY) IVPB 1250 mg/250 mL  Status:  Discontinued        1,250 mg 166.7 mL/hr over 90 Minutes Intravenous Every 36 hours 12/27/20 2315 12/28/20 1007   12/28/20 2000  vancomycin (VANCOCIN) 50 mg/mL oral solution 125 mg  Status:  Discontinued        125 mg Oral Every 6 hours 12/28/20 1801 12/30/20 0956   12/28/20 0830  clindamycin (CLEOCIN) IVPB 900 mg  Status:  Discontinued       "And" Linked Group Details   900 mg 100 mL/hr over 30 Minutes Intravenous On call to O.R. 12/28/20 2706 12/29/20 0559   12/28/20 0830  gentamicin (GARAMYCIN) 360 mg in dextrose 5 % 100 mL IVPB   Status:  Discontinued       "And" Linked Group Details   5 mg/kg  71.2 kg 109 mL/hr over 60 Minutes Intravenous On call to O.R. 12/28/20 2376 12/29/20 0559   12/28/20 0600  ceFEPIme (MAXIPIME) 2 g in sodium chloride 0.9 % 100 mL IVPB  Status:  Discontinued        2 g 200 mL/hr over 30 Minutes Intravenous Every 12 hours 12/27/20 2315 12/29/20 0955   12/28/20 0200  metroNIDAZOLE (FLAGYL) IVPB 500 mg  Status:  Discontinued        500 mg 100 mL/hr over 60 Minutes Intravenous Every 8 hours 12/27/20 2202 12/29/20 0955   12/27/20 1830  aztreonam (AZACTAM) 2 g in sodium chloride 0.9 % 100 mL IVPB  Status:  Discontinued        2 g 200 mL/hr over 30 Minutes Intravenous  Once 12/27/20 1820 12/27/20 1823   12/27/20 1830  metroNIDAZOLE (FLAGYL) IVPB 500 mg        500 mg 100 mL/hr over 60 Minutes Intravenous  Once 12/27/20 1820 12/27/20 1935   12/27/20 1830  vancomycin (VANCOCIN) IVPB 1000 mg/200 mL premix        1,000 mg 200 mL/hr over 60 Minutes Intravenous  Once 12/27/20 1820 12/27/20 1934   12/27/20 1830  ceFEPIme (MAXIPIME) 2 g in sodium chloride 0.9 %  100 mL IVPB        2 g 200 mL/hr over 30 Minutes Intravenous  Once 12/27/20 1824 12/27/20 1903       Subjective: Patient seen and examined at bedside.  Required IV Haldol again yesterday evening for severe agitation as per nursing staff.  No overnight fever or vomiting reported.   Objective: Vitals:   01/02/21 0450 01/02/21 0500 01/02/21 0600 01/02/21 0612  BP: (!) 184/104 (!) 172/115 (!) 195/90 (!) 195/90  Pulse: 74 69 76 96  Resp: 20 (!) 22 (!) 21   Temp:      TempSrc:      SpO2:      Weight:      Height:        Intake/Output Summary (Last 24 hours) at 01/02/2021 0722 Last data filed at 01/02/2021 0647 Gross per 24 hour  Intake 1020.63 ml  Output 1000 ml  Net 20.63 ml   Filed Weights   12/31/20 0343 01/01/21 0353 01/02/21 0308  Weight: 74.5 kg 73.3 kg 71.3 kg    Examination:  General exam: Looks chronically ill and  deconditioned.  Still on 1 L oxygen via nasal cannula.  Elderly male lying in bed.   Respiratory system: Decreased breath sounds at bases bilaterally with scattered crackles; tachypnea  cardiovascular system: Rate controlled, S1-S2 heard  gastrointestinal system: Abdomen is more distended today, soft and mildly tender diffusely.  Bowel sounds are sluggish extremities: No cyanosis; mild lower extremity edema present Central nervous system: Awake this morning and talks in sentences which do not make sense much.  No focal neurological deficits.  Moves extremities  skin: No obvious ecchymosis/lesions Psychiatry: Cannot assess because of mental status   Data Reviewed: I have personally reviewed following labs and imaging studies  CBC: Recent Labs  Lab 12/27/20 1752 12/28/20 0514 12/29/20 0256 12/30/20 0751 12/31/20 0204 01/01/21 0237  WBC 15.3* 7.8 7.5 12.2* 12.0* 14.1*  NEUTROABS 11.2*  --  6.7 9.2* 10.3* 12.1*  HGB 12.9* 10.5* 11.1* 11.1* 10.1* 10.6*  HCT 42.1 34.7* 36.0* 37.0* 33.0* 34.8*  MCV 93.1 94.6 94.0 93.9 91.7 91.8  PLT 238 174 164 234 232 409   Basic Metabolic Panel: Recent Labs  Lab 12/28/20 0514 12/29/20 0256 12/30/20 0751 12/31/20 0204 01/01/21 0237  NA 139 141 143 148* 149*  K 3.2* 4.1 4.2 3.8 3.2*  CL 110 116* 116* 117* 118*  CO2 19* 19* 18* 15* 24  GLUCOSE 115* 130* 113* 125* 184*  BUN 26* 37* 35* 51* 60*  CREATININE 1.40* 1.34* 1.24 1.47* 1.35*  CALCIUM 7.9* 7.8* 8.4* 9.0 9.1  MG  --  2.1 2.2 2.4 2.4   GFR: Estimated Creatinine Clearance: 36 mL/min (A) (by C-G formula based on SCr of 1.35 mg/dL (H)). Liver Function Tests: Recent Labs  Lab 12/28/20 0514 12/29/20 0256 12/30/20 0751 12/31/20 0204 01/01/21 0237  AST 31 50* 51* 86* 93*  ALT 17 22 29  38 55*  ALKPHOS 45 43 51 62 70  BILITOT 0.5 0.4 0.7 0.9 0.7  PROT 5.3* 5.6* 6.5 6.6 6.9  ALBUMIN 2.7* 2.8* 3.0* 3.2* 3.4*   Recent Labs  Lab 12/27/20 1752  LIPASE 22   Recent Labs  Lab  12/30/20 0751  AMMONIA 14   Coagulation Profile: Recent Labs  Lab 12/27/20 1752  INR 1.3*   Cardiac Enzymes: No results for input(s): CKTOTAL, CKMB, CKMBINDEX, TROPONINI in the last 168 hours. BNP (last 3 results) No results for input(s): PROBNP in the last 8760 hours.  HbA1C: No results for input(s): HGBA1C in the last 72 hours. CBG: No results for input(s): GLUCAP in the last 168 hours. Lipid Profile: No results for input(s): CHOL, HDL, LDLCALC, TRIG, CHOLHDL, LDLDIRECT in the last 72 hours. Thyroid Function Tests: Recent Labs    12/31/20 0204  TSH 0.442   Anemia Panel: Recent Labs    12/31/20 0204  [id number]  FOLATE 16.6   Sepsis Labs: Recent Labs  Lab 12/27/20 1752 12/27/20 1952 12/27/20 2132 12/28/20 0514  PROCALCITON  --   --   --  29.44  LATICACIDVEN 3.2* 2.8* 2.5*  --     Recent Results (from the past 240 hour(s))  Blood Culture (routine x 2)     Status: None   Collection Time: 12/27/20  5:52 PM   Specimen: BLOOD  Result Value Ref Range Status   Specimen Description   Final    BLOOD SITE NOT SPECIFIED Performed at [facility] [address] [place], [place] [address]    Special Requests   Final    BOTTLES DRAWN AEROBIC AND ANAEROBIC Blood Culture adequate volume Performed at [facility] [address], [place], [place] [address]    Culture   Final    NO GROWTH 5 DAYS Performed at [facility], [place] [address], [place], [place] [address]    Report Status 01/01/2021 FINAL  Final  Blood Culture (routine x 2)     Status: None   Collection Time: 12/27/20  6:19 PM   Specimen: BLOOD  Result Value Ref Range Status   Specimen Description   Final    BLOOD LEFT ANTECUBITAL Performed at [facility] [address], [place], [place] [address]    Special Requests   Final    BOTTLES DRAWN AEROBIC AND ANAEROBIC Blood Culture results may not be optimal due to an inadequate volume of blood received  in culture bottles Performed at [facility] [address] [place], [place] [address]    Culture   Final    NO GROWTH 5 DAYS Performed at [facility], [place] [address] [place], [place] [address]    Report Status 01/01/2021 FINAL  Final  Resp Panel by RT-PCR (Flu A&B, Covid) Nasopharyngeal Swab     Status: None   Collection Time: 12/27/20  6:21 PM   Specimen: Nasopharyngeal Swab; Nasopharyngeal(NP) swabs in vial transport medium  Result Value Ref Range Status   SARS Coronavirus 2 by RT PCR NEGATIVE NEGATIVE Final    Comment: (NOTE) SARS-CoV-2 target nucleic acids are NOT DETECTED.  The SARS-CoV-2 RNA is generally detectable in upper respiratory specimens during the acute phase of infection. The lowest concentration of SARS-CoV-2 viral copies this assay can detect is 138 copies/mL. A negative result does not preclude SARS-Cov-2 infection and should not be used as the sole basis for treatment or other patient management decisions. A negative result may occur with  improper specimen collection/handling, submission of specimen other than nasopharyngeal swab, presence of viral mutation(s) within the areas targeted by this assay, and inadequate number of viral copies(<138 copies/mL). A negative result must be combined with clinical observations, patient history, and epidemiological information. The expected result is Negative.  Fact Sheet for Patients:  EntrepreneurPulse.com.au  Fact Sheet for Healthcare Providers:  IncredibleEmployment.be  This test is no t yet approved or cleared by the [place] FDA and  has been authorized for detection and/or diagnosis of SARS-CoV-2 by FDA under an Emergency  Use Authorization (EUA). This EUA will remain  in effect (meaning this test can be used) for the duration of the COVID-19 declaration under Section 564(b)(1) of the Act, 21 U.S.C.section 360bbb-3(b)(1), unless the  authorization is terminated  or revoked sooner.       Influenza A by PCR NEGATIVE NEGATIVE Final   Influenza B by PCR NEGATIVE NEGATIVE Final    Comment: (NOTE) The Xpert Xpress SARS-CoV-2/FLU/RSV plus assay is intended as an aid in the diagnosis of influenza from Nasopharyngeal swab specimens and should not be used as a sole basis for treatment. Nasal washings and aspirates are unacceptable for Xpert Xpress SARS-CoV-2/FLU/RSV testing.  Fact Sheet for Patients: EntrepreneurPulse.com.au  Fact Sheet for Healthcare Providers: IncredibleEmployment.be  This test is not yet approved or cleared by the [place] FDA and has been authorized for detection and/or diagnosis of SARS-CoV-2 by FDA under an Emergency Use Authorization (EUA). This EUA will remain in effect (meaning this test can be used) for the duration of the COVID-19 declaration under Section 564(b)(1) of the Act, 21 U.S.C. section 360bbb-3(b)(1), unless the authorization is terminated or revoked.  Performed at [facility], [place] [address], [place], [place] [address]   Urine culture     Status: Abnormal   Collection Time: 12/27/20 10:14 PM   Specimen: In/Out Cath Urine  Result Value Ref Range Status   Specimen Description   Final    IN/OUT CATH URINE Performed at [facility] [address], [place], [place] [address]    Special Requests   Final    NONE Performed at [facility], [place] [address] [place], [place] [address]    Culture MULTIPLE SPECIES PRESENT, SUGGEST RECOLLECTION (A)  Final   Report Status 12/29/2020 FINAL  Final  MRSA PCR Screening     Status: Abnormal   Collection Time: 12/28/20  8:46 AM   Specimen: Nasal Mucosa; Nasopharyngeal  Result Value Ref Range Status   MRSA by PCR POSITIVE (A) NEGATIVE Final    Comment:        The GeneXpert MRSA Assay (FDA approved for NASAL specimens only), is one  component of a comprehensive MRSA colonization surveillance program. It is not intended to diagnose MRSA infection nor to guide or monitor treatment for MRSA infections. RESULT CALLED TO, READ BACK BY AND VERIFIED WITH: Leida Lauth RN AT 1112 12/28/20 MULLINS,T Performed at [facility], [place] [address], [place], [place] [address]   C Difficile Quick Screen w PCR reflex     Status: Abnormal   Collection Time: 12/28/20 11:43 AM   Specimen: STOOL  Result Value Ref Range Status   C Diff antigen POSITIVE (A) NEGATIVE Final   C Diff toxin POSITIVE (A) NEGATIVE Final   C Diff interpretation Toxin producing C. difficile detected.  Final    Comment: CRITICAL RESULT CALLED TO, READ BACK BY AND VERIFIED WITH: L.CAUDLE, RN AT 1757 ON 05.18.22 BY N.THOMPSON Performed at [facility] [address], [place], [place] [address]   Gastrointestinal Panel by PCR , Stool     Status: None   Collection Time: 12/28/20 11:43 AM   Specimen: STOOL  Result Value Ref Range Status   Campylobacter species NOT DETECTED NOT DETECTED Final   Plesimonas shigelloides NOT DETECTED NOT DETECTED Final   Salmonella species NOT DETECTED NOT DETECTED Final   Yersinia enterocolitica NOT DETECTED NOT DETECTED Final   Vibrio species NOT DETECTED NOT DETECTED Final   Vibrio cholerae NOT  DETECTED NOT DETECTED Final   Enteroaggregative E coli (EAEC) NOT DETECTED NOT DETECTED Final   Enteropathogenic E coli (EPEC) NOT DETECTED NOT DETECTED Final   Enterotoxigenic E coli (ETEC) NOT DETECTED NOT DETECTED Final   Shiga like toxin producing E coli (STEC) NOT DETECTED NOT DETECTED Final   Shigella/Enteroinvasive E coli (EIEC) NOT DETECTED NOT DETECTED Final   Cryptosporidium NOT DETECTED NOT DETECTED Final   Cyclospora cayetanensis NOT DETECTED NOT DETECTED Final   Entamoeba histolytica NOT DETECTED NOT DETECTED Final   Giardia lamblia NOT DETECTED NOT DETECTED Final   Adenovirus F40/41  NOT DETECTED NOT DETECTED Final   Astrovirus NOT DETECTED NOT DETECTED Final   Norovirus GI/GII NOT DETECTED NOT DETECTED Final   Rotavirus A NOT DETECTED NOT DETECTED Final   Sapovirus (I, II, IV, and V) NOT DETECTED NOT DETECTED Final    Comment: Performed at [facility], [address], [place], [place] [address]         Radiology Studies: DG Abd 1 View  Result Date: 12/31/2020 CLINICAL DATA:  Abdominal distension EXAM: ABDOMEN - 1 VIEW COMPARISON:  12/30/2020. FINDINGS: Gaseous distension of the colon is again noted with slight increase distension of the RIGHT colon. Nondistended gas-filled small bowel loops are again noted. No other significant changes noted. IMPRESSION: Gaseous distension of the colon again noted with slight increased distention of the RIGHT colon. Electronically Signed   By: Margarette Canada M.D.   On: 12/31/2020 10:56   DG CHEST PORT 1 VIEW  Result Date: 12/31/2020 CLINICAL DATA:  Wheezing. EXAM: PORTABLE CHEST 1 VIEW COMPARISON:  12/27/2020 and prior studies FINDINGS: Cardiomegaly and mild peribronchial thickening again noted. Mild LEFT basilar atelectasis/scarring again identified. There is no evidence of focal airspace disease, pulmonary edema, suspicious pulmonary nodule/mass, pleural effusion, or pneumothorax. No acute bony abnormalities are identified. IMPRESSION: Cardiomegaly, mild chronic peribronchial thickening and mild LEFT basilar atelectasis/scarring. No evidence of acute cardiopulmonary disease. Electronically Signed   By: Margarette Canada M.D.   On: 12/31/2020 10:54        Scheduled Meds: . aspirin EC  81 mg Oral Daily  . budesonide (PULMICORT) nebulizer solution  0.5 mg Nebulization BID  . Chlorhexidine Gluconate Cloth  6 each Topical Daily  . heparin  5,000 Units Subcutaneous Q8H  . ipratropium-albuterol  3 mL Nebulization BID  . mouth rinse  15 mL Mouth Rinse BID  . methylPREDNISolone (SOLU-MEDROL) injection  40 mg Intravenous Q8H  .  metoprolol tartrate  25 mg Oral Q6H  . pantoprazole  40 mg Oral Daily  . simvastatin  40 mg Oral QPM  . sodium chloride flush  3 mL Intravenous Q12H  . tamsulosin  0.4 mg Oral Daily  . vancomycin  500 mg Oral Q6H   Continuous Infusions: . dextrose 5 % with kcl 75 mL/hr at 01/02/21 0014  . metronidazole Stopped (01/02/21 0406)          Aline August, MD [facility] 01/02/2021, 7:22 AM

## 2021-01-02 NOTE — Progress Notes (Signed)
Progress Note  4 Days Post-Op  Subjective: CC: afebrile, hypertensive. NT bedside and reports he has been trying to get OOB. He denies pain. He denies nausea or emesis on clears. Having BMs and passing flatus  Objective: Vital signs in last 24 hours: Temp:  [97.6 F (36.4 C)-98.3 F (36.8 C)] 97.6 F (36.4 C) (05/23 0800) Pulse Rate:  [62-110] 96 (05/23 0612) Resp:  [16-25] 21 (05/23 0600) BP: (126-199)/(61-154) 195/90 (05/23 0612) SpO2:  [98 %-99 %] 99 % (05/23 0751) Weight:  [71.3 kg] 71.3 kg (05/23 0308) Last BM Date: 01/01/21  Intake/Output from previous day: 05/22 0701 - 05/23 0700 In: 1020.6 [I.V.:630.1; IV Piggyback:390.6] Out: 1000 [Urine:1000] Intake/Output this shift: No intake/output data recorded.  PE: General: pleasant, WD, male who is laying in bed in NAD, NT bedside. Soft restraints HEENT: head is normocephalic, atraumatic.  Edentulous Heart: regular, rate, and rhythm.  Palpable radial and pedal pulses bilaterally Lungs: CTAB, no wheezes, rhonchi, or rales noted.  Respiratory effort nonlabored Abd: very distended. +BS. Minimal tenderness to palpation midline. No guarding or rebound MS: all 4 extremities are symmetrical with no cyanosis, clubbing, or edema. Skin: warm and dry  Neuro: Cranial nerves 2-12 grossly intact, sensation is normal throughout Psych: Alert and oriented to self, place, situation   Lab Results:  Recent Labs    12/31/20 0204 01/01/21 0237  WBC 12.0* 14.1*  HGB 10.1* 10.6*  HCT 33.0* 34.8*  PLT 232 265   BMET Recent Labs    12/31/20 0204 01/01/21 0237  NA 148* 149*  K 3.8 3.2*  CL 117* 118*  CO2 15* 24  GLUCOSE 125* 184*  BUN 51* 60*  CREATININE 1.47* 1.35*  CALCIUM 9.0 9.1   PT/INR No results for input(s): LABPROT, INR in the last 72 hours. CMP     Component Value Date/Time   NA 149 (H) 01/01/2021 0237   NA 135 (L) 04/29/2013 1543   K 3.2 (L) 01/01/2021 0237   K 3.3 (L) 04/29/2013 1543   CL 118 (H) 01/01/2021  0237   CL 102 04/29/2013 1543   CO2 24 01/01/2021 0237   CO2 27 04/29/2013 1543   GLUCOSE 184 (H) 01/01/2021 0237   GLUCOSE 120 (H) 04/29/2013 1543   BUN 60 (H) 01/01/2021 0237   BUN 17 04/29/2013 1543   CREATININE 1.35 (H) 01/01/2021 0237   CREATININE 2.47 (H) 09/09/2020 1343   CALCIUM 9.1 01/01/2021 0237   CALCIUM 9.8 04/29/2013 1543   PROT 6.9 01/01/2021 0237   PROT 7.9 04/29/2013 1543   ALBUMIN 3.4 (L) 01/01/2021 0237   ALBUMIN 3.8 04/29/2013 1543   AST 93 (H) 01/01/2021 0237   AST 30 04/29/2013 1543   ALT 55 (H) 01/01/2021 0237   ALT 24 04/29/2013 1543   ALKPHOS 70 01/01/2021 0237   ALKPHOS 79 04/29/2013 1543   BILITOT 0.7 01/01/2021 0237   BILITOT 0.4 04/29/2013 1543   GFRNONAA 51 (L) 01/01/2021 0237   GFRNONAA >60 04/29/2013 1543   GFRAA 42 (L) 05/04/2017 1945   GFRAA >60 04/29/2013 1543   Lipase     Component Value Date/Time   LIPASE 22 12/27/2020 1752   LIPASE 154 04/29/2013 1543       Studies/Results: DG Abd 1 View  Result Date: 12/31/2020 CLINICAL DATA:  Abdominal distension EXAM: ABDOMEN - 1 VIEW COMPARISON:  12/30/2020. FINDINGS: Gaseous distension of the colon is again noted with slight increase distension of the RIGHT colon. Nondistended gas-filled small bowel loops are again noted.  No other significant changes noted. IMPRESSION: Gaseous distension of the colon again noted with slight increased distention of the RIGHT colon. Electronically Signed   By: Margarette Canada M.D.   On: 12/31/2020 10:56   DG CHEST PORT 1 VIEW  Result Date: 12/31/2020 CLINICAL DATA:  Wheezing. EXAM: PORTABLE CHEST 1 VIEW COMPARISON:  12/27/2020 and prior studies FINDINGS: Cardiomegaly and mild peribronchial thickening again noted. Mild LEFT basilar atelectasis/scarring again identified. There is no evidence of focal airspace disease, pulmonary edema, suspicious pulmonary nodule/mass, pleural effusion, or pneumothorax. No acute bony abnormalities are identified. IMPRESSION:  Cardiomegaly, mild chronic peribronchial thickening and mild LEFT basilar atelectasis/scarring. No evidence of acute cardiopulmonary disease. Electronically Signed   By: Margarette Canada M.D.   On: 12/31/2020 10:54    Anti-infectives: Anti-infectives (From admission, onward)   Start     Dose/Rate Route Frequency Ordered Stop   12/30/20 1200  vancomycin (VANCOCIN) 50 mg/mL oral solution 500 mg        500 mg Oral Every 6 hours 12/30/20 0956     12/30/20 1200  metroNIDAZOLE (FLAGYL) IVPB 500 mg        500 mg 100 mL/hr over 60 Minutes Intravenous Every 8 hours 12/30/20 0956     12/29/20 0600  vancomycin (VANCOREADY) IVPB 1250 mg/250 mL  Status:  Discontinued        1,250 mg 166.7 mL/hr over 90 Minutes Intravenous Every 36 hours 12/27/20 2315 12/28/20 1007   12/28/20 2000  vancomycin (VANCOCIN) 50 mg/mL oral solution 125 mg  Status:  Discontinued        125 mg Oral Every 6 hours 12/28/20 1801 12/30/20 0956   12/28/20 0830  clindamycin (CLEOCIN) IVPB 900 mg  Status:  Discontinued       "And" Linked Group Details   900 mg 100 mL/hr over 30 Minutes Intravenous On call to O.R. 12/28/20 9323 12/29/20 0559   12/28/20 0830  gentamicin (GARAMYCIN) 360 mg in dextrose 5 % 100 mL IVPB  Status:  Discontinued       "And" Linked Group Details   5 mg/kg  71.2 kg 109 mL/hr over 60 Minutes Intravenous On call to O.R. 12/28/20 5573 12/29/20 0559   12/28/20 0600  ceFEPIme (MAXIPIME) 2 g in sodium chloride 0.9 % 100 mL IVPB  Status:  Discontinued        2 g 200 mL/hr over 30 Minutes Intravenous Every 12 hours 12/27/20 2315 12/29/20 0955   12/28/20 0200  metroNIDAZOLE (FLAGYL) IVPB 500 mg  Status:  Discontinued        500 mg 100 mL/hr over 60 Minutes Intravenous Every 8 hours 12/27/20 2202 12/29/20 0955   12/27/20 1830  aztreonam (AZACTAM) 2 g in sodium chloride 0.9 % 100 mL IVPB  Status:  Discontinued        2 g 200 mL/hr over 30 Minutes Intravenous  Once 12/27/20 1820 12/27/20 1823   12/27/20 1830   metroNIDAZOLE (FLAGYL) IVPB 500 mg        500 mg 100 mL/hr over 60 Minutes Intravenous  Once 12/27/20 1820 12/27/20 1935   12/27/20 1830  vancomycin (VANCOCIN) IVPB 1000 mg/200 mL premix        1,000 mg 200 mL/hr over 60 Minutes Intravenous  Once 12/27/20 1820 12/27/20 1934   12/27/20 1830  ceFEPIme (MAXIPIME) 2 g in sodium chloride 0.9 % 100 mL IVPB        2 g 200 mL/hr over 30 Minutes Intravenous  Once 12/27/20 1824 12/27/20 1903  Assessment/Plan CAD status post stenting to RCA in 1997 COPD CKD stage III- Cr 1.34  Hypertension Hyperlipidemia BPH  Cholelithiasis Chronic cholecystitis- follow up with Dr. Redmond Pulling as outpatient as C diff source of infection this admit. - postpone lap chole until outpatient  C. Diff colitis  - worsening abdominal xray without but still without any concern for peritonitis on PE - no pain in abdomen - NPO - will get CT abd/pelvis today  Agree with plan of treating C. difficile aggressively with multiorgan system evaluation/support.  FEN: NPO, IVF VTE: SQ heparin - would not start heparin gtt yet ID: vanc 5/17; Cefepime/flagyl 5/17>5/19, gent/clinda 5/18; PO VANC 5/19>>     LOS: 6 days    Winferd Humphrey, [facility] 01/02/2021, 9:03 AM Please see Amion for pager number during day hours 7:00am-4:30pm

## 2021-01-02 NOTE — Evaluation (Signed)
Clinical/Bedside Swallow Evaluation Patient Details  Name: Leon Estes MRN: [id number] Date of Birth: July 08, 1931  Today's Date: 01/02/2021 Time: SLP Start Time (ACUTE ONLY): 47 SLP Stop Time (ACUTE ONLY): 0935 SLP Time Calculation (min) (ACUTE ONLY): 20 min  Past Medical History:  Past Medical History:  Diagnosis Date  . Asthma   . BPH (benign prostatic hypertrophy)   . Cataract   . Chronic renal disease, stage III ([place])   . Chronic sinusitis   . COPD (chronic obstructive pulmonary disease) ([place])   . Coronary artery disease    2 stents  . Gall stones   . Hearing loss    DOES NOT WEAR HEARING AIDS  . High cholesterol   . Hypertension   . Macular degeneration    right eye   Past Surgical History:  Past Surgical History:  Procedure Laterality Date  . APPENDECTOMY    . CORONARY ANGIOPLASTY WITH STENT PLACEMENT  1998   2 stents  . TRANSURETHRAL RESECTION OF PROSTATE     HPI:  Patient is an 87 y.o. male with PMH: CAD s/p PCI/stenting to RCA 1997, COPD, CKD stage III, HTN, HLD, BPH who presented to ED for evaluation of fever and weakness. He was recent admission to [facility] 4/16-4/19/22 with presumed acute cholecystitis. CXR was negative for focal consolidation, edema, effusion. He was admitted for sepsis and started on clear liquids (nectar thick) by MD.   Assessment / Plan / Recommendation Clinical Impression  Patient presents with a mild oropharyngeal dysphagia with likely some impact from his current state of confusion and reported fluctuating agitation. SLP observed patient with cup and straw sips of plain, thin water. Swallow appeared timely and patient exhibited delayed cough approximately 20% of all sips. Cough was non-productive but sounded congested and was observed in absence of PO's a couple times during this evaluation. No changes detected in patient's vocal quality, intensity. SLP recommending upgrade to thin liquids from nectar thick liquids and SLP will continue to  follow patient and work with MD regarding his ability to be upgraded to some solid textures. SLP Visit Diagnosis: Dysphagia, unspecified (R13.10)    Aspiration Risk  Mild aspiration risk    Diet Recommendation Thin liquid   Liquid Administration via: Cup;Straw Medication Administration: Crushed with puree Supervision: Full supervision/cueing for compensatory strategies;Staff to assist with self feeding Compensations: Minimize environmental distractions;Slow rate;Small sips/bites Postural Changes: Seated upright at 90 degrees    Other  Recommendations Oral Care Recommendations: Oral care BID;Staff/trained caregiver to provide oral care   Follow up Recommendations Other (comment) (TBD)      Frequency and Duration    1 week       Prognosis Prognosis for Safe Diet Advancement: Good      Swallow Study   General Date of Onset: 12/27/20 HPI: Patient is an 87 y.o. male with PMH: CAD s/p PCI/stenting to RCA 1997, COPD, CKD stage III, HTN, HLD, BPH who presented to ED for evaluation of fever and weakness. He was recent admission to [facility] 4/16-4/19/22 with presumed acute cholecystitis. CXR was negative for focal consolidation, edema, effusion. He was admitted for sepsis and started on clear liquids (nectar thick) by MD. Type of Study: Bedside Swallow Evaluation Previous Swallow Assessment: None found Diet Prior to this Study: Nectar-thick liquids Temperature Spikes Noted: No Respiratory Status: Nasal cannula History of Recent Intubation: No Behavior/Cognition: Alert;Cooperative;Pleasant mood;Confused Oral Cavity Assessment: Within Functional Limits Oral Care Completed by SLP: Recent completion by staff Self-Feeding Abilities: Total assist Patient Positioning: Upright  in bed Baseline Vocal Quality: Normal Volitional Cough: Cognitively unable to elicit Volitional Swallow: Unable to elicit    Oral/Motor/Sensory Function Overall Oral Motor/Sensory Function: Within functional limits    Ice Chips     Thin Liquid Thin Liquid: Impaired Presentation: Straw;Cup Pharyngeal  Phase Impairments: Cough - Delayed Other Comments: Intermittent congested sounding, non productive cough after sips of thin liquids however this was also observed in absence of PO's    Nectar Thick   NT  Honey Thick   NT  Puree   NT  Solid       NT    Sonia Baller, MA, CCC-SLP Speech Therapy

## 2021-01-03 ENCOUNTER — Other Ambulatory Visit (HOSPITAL_COMMUNITY): Payer: PPO

## 2021-01-03 DIAGNOSIS — R531 Weakness: Secondary | ICD-10-CM

## 2021-01-03 DIAGNOSIS — K5939 Other megacolon: Secondary | ICD-10-CM | POA: Diagnosis not present

## 2021-01-03 DIAGNOSIS — Z515 Encounter for palliative care: Secondary | ICD-10-CM | POA: Diagnosis not present

## 2021-01-03 DIAGNOSIS — R410 Disorientation, unspecified: Secondary | ICD-10-CM | POA: Diagnosis not present

## 2021-01-03 DIAGNOSIS — Z7189 Other specified counseling: Secondary | ICD-10-CM

## 2021-01-03 DIAGNOSIS — A0472 Enterocolitis due to Clostridium difficile, not specified as recurrent: Secondary | ICD-10-CM | POA: Diagnosis not present

## 2021-01-03 DIAGNOSIS — K812 Acute cholecystitis with chronic cholecystitis: Secondary | ICD-10-CM | POA: Diagnosis not present

## 2021-01-03 LAB — MAGNESIUM: Magnesium: 2.1 mg/dL (ref 1.7–2.4)

## 2021-01-03 LAB — POTASSIUM: Potassium: 3.6 mmol/L (ref 3.5–5.1)

## 2021-01-03 LAB — CBC WITH DIFFERENTIAL/PLATELET
Abs Immature Granulocytes: 0.18 10*3/uL — ABNORMAL HIGH (ref 0.00–0.07)
Basophils Absolute: 0 10*3/uL (ref 0.0–0.1)
Basophils Relative: 0 %
Eosinophils Absolute: 0 10*3/uL (ref 0.0–0.5)
Eosinophils Relative: 0 %
HCT: 32 % — ABNORMAL LOW (ref 39.0–52.0)
Hemoglobin: 10.1 g/dL — ABNORMAL LOW (ref 13.0–17.0)
Immature Granulocytes: 2 %
Lymphocytes Relative: 6 %
Lymphs Abs: 0.5 10*3/uL — ABNORMAL LOW (ref 0.7–4.0)
MCH: 28.3 pg (ref 26.0–34.0)
MCHC: 31.6 g/dL (ref 30.0–36.0)
MCV: 89.6 fL (ref 80.0–100.0)
Monocytes Absolute: 0.7 10*3/uL (ref 0.1–1.0)
Monocytes Relative: 8 %
Neutro Abs: 7.3 10*3/uL (ref 1.7–7.7)
Neutrophils Relative %: 84 %
Platelets: 245 10*3/uL (ref 150–400)
RBC: 3.57 MIL/uL — ABNORMAL LOW (ref 4.22–5.81)
RDW: 14.5 % (ref 11.5–15.5)
WBC: 8.7 10*3/uL (ref 4.0–10.5)
nRBC: 0 % (ref 0.0–0.2)

## 2021-01-03 LAB — LACTIC ACID, PLASMA: Lactic Acid, Venous: 1.4 mmol/L (ref 0.5–1.9)

## 2021-01-03 LAB — COMPREHENSIVE METABOLIC PANEL
ALT: 48 U/L — ABNORMAL HIGH (ref 0–44)
AST: 35 U/L (ref 15–41)
Albumin: 2.9 g/dL — ABNORMAL LOW (ref 3.5–5.0)
Alkaline Phosphatase: 50 U/L (ref 38–126)
Anion gap: 8 (ref 5–15)
BUN: 40 mg/dL — ABNORMAL HIGH (ref 8–23)
CO2: 17 mmol/L — ABNORMAL LOW (ref 22–32)
Calcium: 8.3 mg/dL — ABNORMAL LOW (ref 8.9–10.3)
Chloride: 115 mmol/L — ABNORMAL HIGH (ref 98–111)
Creatinine, Ser: 1.07 mg/dL (ref 0.61–1.24)
GFR, Estimated: >60 mL/min (ref >60)
Glucose, Bld: 219 mg/dL — ABNORMAL HIGH (ref 70–99)
Potassium: 2.7 mmol/L — CL (ref 3.5–5.1)
Sodium: 140 mmol/L (ref 135–145)
Total Bilirubin: 0.5 mg/dL (ref 0.3–1.2)
Total Protein: 5.3 g/dL — ABNORMAL LOW (ref 6.5–8.1)

## 2021-01-03 MED ORDER — POTASSIUM CHLORIDE 10 MEQ/100ML IV SOLN
10.0000 meq | INTRAVENOUS | Status: AC
  Administered 2021-01-03 (×6): 10 meq via INTRAVENOUS
  Filled 2021-01-03 (×6): qty 100

## 2021-01-03 MED ORDER — METHYLPREDNISOLONE SODIUM SUCC 40 MG IJ SOLR
40.0000 mg | INTRAMUSCULAR | Status: DC
  Administered 2021-01-04 – 2021-01-06 (×3): 40 mg via INTRAVENOUS
  Filled 2021-01-03 (×3): qty 1

## 2021-01-03 NOTE — Progress Notes (Signed)
Patient case reviewed with Dr. Hassell Done - reviewed options of surgery and continued management with antibiotics. Discussed findings of concern for toxic megacolon and worsening pain with wife and granddaughter who were bedside. Patient asleep throughout discussion. We discussed treatment options of continued antibiotics vs surgical intervention with colectomy possible ostomy creation. Given his age and comorbidities he is a higher risk surgical candidate - risk for protracted recovery/rehab and risks of general anesthesia including difficulty extubating. We discussed the increasing risk for perforation if he does not improve or surgery is not completed. At present his lactic acid is normal and there may be a chance for improvement on antibiotics. Wife is unsure if her husband would wish to undergo surgery or have an ostomy and would like to discuss all of this with their children. Will recheck xray and lactic acid tomorrow. Remain NPO for now

## 2021-01-03 NOTE — TOC Progression Note (Signed)
Transition of Care [facility]) - Progression Note    Patient Details  Name: Leon Estes MRN: [id number] Date of Birth: Jun 06, 1931  Transition of Care [facility]) CM/SW Contact  Leeroy Cha, RN Phone Number: 01/03/2021, 9:16 AM  Clinical Narrative:    87 y.o.malewith past medical history of CHF, COPD, choledocholithiasis, CAD status post PCI, COPD, CKD, hypertension, hyperlipidemia, BPHadmitted on 5/17/2022with sepsis. He was recently admitted to [facility] 4/16 through 4/19 for presumed cholecystitis. He was being followed by Dr. Redmond Pulling for cholecystectomy later this month.Initially, there was concern that his presentation was related to cholecystitis again, however, with further work-up it appears that this is actually more from C. difficile. His hospital course has been further complicated by A. fib, COPD exacerbation, and development of agitation/delirium.Palliative consulted for goals of care.  Recommendations/Plan:  DNR/DNI  Continue current interventions.  Plan for CT to further characterize.   PLAN: not determined at this time, palliative care seeing for goals of care.   Expected Discharge Plan: Home/Self Care Barriers to Discharge: Continued Medical Work up  Expected Discharge Plan and Services Expected Discharge Plan: Home/Self Care   Discharge Planning Services: CM Consult   Living arrangements for the past 2 months: Single Family Home                                       Social Determinants of Health (SDOH) Interventions    Readmission Risk Interventions No flowsheet data found.

## 2021-01-03 NOTE — Progress Notes (Signed)
Progress Note  5 Days Post-Op  Subjective: CC: afebrile, hypertensive overnight. Much less interactive than yesterday and sleepy though he awakens easily. Having BMs and no reported nausea/emesis  Objective: Vital signs in last 24 hours: Temp:  [97.4 F (36.3 C)-98.2 F (36.8 C)] 97.4 F (36.3 C) (05/24 0753) Pulse Rate:  [75-108] 75 (05/24 0600) Resp:  [19-28] 28 (05/24 0600) BP: (119-232)/(58-215) 130/63 (05/24 0600) SpO2:  [94 %-99 %] 94 % (05/24 0600) Weight:  [72.2 kg] 72.2 kg (05/24 0438) Last BM Date: 01/02/21  Intake/Output from previous day: 05/23 0701 - 05/24 0700 In: 3751 [I.V.:2612.9; IV Piggyback:1138.1] Out: 250 [Urine:250] Intake/Output this shift: Total I/O In: 120.5 [I.V.:52.6; IV Piggyback:68] Out: -   PE: General: frail, ill appearing, male who is laying in bed in NAD, NT bedside. Soft restraints HEENT: head is normocephalic, atraumatic.  Edentulous Heart: regular, rate, and rhythm.  Palpable radial and pedal pulses bilaterally Lungs: bilateral crackles.  Respiratory effort nonlabored Abd: very distended. +BS. Diffuse and moderate tenderness to palpation with guarding MS: all 4 extremities are symmetrical with no cyanosis, clubbing, or edema. Skin: warm and dry  Psych/Neuro: arouses to voice and pain. Calm   Lab Results:  Recent Labs    01/02/21 1103 01/03/21 0240  WBC 8.6 8.7  HGB 10.6* 10.1*  HCT 33.5* 32.0*  PLT 265 245   BMET Recent Labs    01/02/21 1103 01/02/21 1906 01/03/21 0240  NA 141  --  140  K 2.5* 2.8* 2.7*  CL 115*  --  115*  CO2 22  --  17*  GLUCOSE 246*  --  219*  BUN 42*  --  40*  CREATININE 1.02  --  1.07  CALCIUM 8.4*  --  8.3*   PT/INR No results for input(s): LABPROT, INR in the last 72 hours. CMP     Component Value Date/Time   NA 140 01/03/2021 0240   NA 135 (L) 04/29/2013 1543   K 2.7 (LL) 01/03/2021 0240   K 3.3 (L) 04/29/2013 1543   CL 115 (H) 01/03/2021 0240   CL 102 04/29/2013 1543   CO2 17  (L) 01/03/2021 0240   CO2 27 04/29/2013 1543   GLUCOSE 219 (H) 01/03/2021 0240   GLUCOSE 120 (H) 04/29/2013 1543   BUN 40 (H) 01/03/2021 0240   BUN 17 04/29/2013 1543   CREATININE 1.07 01/03/2021 0240   CREATININE 2.47 (H) 09/09/2020 1343   CALCIUM 8.3 (L) 01/03/2021 0240   CALCIUM 9.8 04/29/2013 1543   PROT 5.3 (L) 01/03/2021 0240   PROT 7.9 04/29/2013 1543   ALBUMIN 2.9 (L) 01/03/2021 0240   ALBUMIN 3.8 04/29/2013 1543   AST 35 01/03/2021 0240   AST 30 04/29/2013 1543   ALT 48 (H) 01/03/2021 0240   ALT 24 04/29/2013 1543   ALKPHOS 50 01/03/2021 0240   ALKPHOS 79 04/29/2013 1543   BILITOT 0.5 01/03/2021 0240   BILITOT 0.4 04/29/2013 1543   GFRNONAA >60 01/03/2021 0240   GFRNONAA >60 04/29/2013 1543   GFRAA 42 (L) 05/04/2017 1945   GFRAA >60 04/29/2013 1543   Lipase     Component Value Date/Time   LIPASE 22 12/27/2020 1752   LIPASE 154 04/29/2013 1543       Studies/Results: CT ABDOMEN PELVIS WO CONTRAST  Result Date: 01/02/2021 CLINICAL DATA:  Abdominal distension, bowel obstruction suspected, infectious gastroenteritis or colitis, possible toxic megacolon, C diff EXAM: CT ABDOMEN AND PELVIS WITHOUT CONTRAST TECHNIQUE: Multidetector CT imaging of the abdomen and  pelvis was performed following the standard protocol without IV contrast. COMPARISON:  12/27/2020 FINDINGS: Lower chest: There is a new small right pleural effusion and associated atelectasis or consolidation. There is incompletely imaged heterogeneous airspace opacity in the posterior right upper lobe (series 7, image 1). Coronary artery calcifications. Hepatobiliary: No solid liver abnormality is seen. No gallstones, gallbladder wall thickening, or biliary dilatation. Pancreas: Unremarkable. No pancreatic ductal dilatation or surrounding inflammatory changes. Spleen: Normal in size without significant abnormality. Adrenals/Urinary Tract: Adrenal glands are unremarkable. Fluid attenuation cyst of the superior pole of  the left kidney. Kidneys are otherwise normal, without renal calculi, solid lesion, or hydronephrosis. Bladder is unremarkable. Stomach/Bowel: Stomach is within normal limits. Appendix appears normal. The colon is diffusely distended and fluid-filled to the rectum, largest loops measuring 10.3 cm in caliber. Vascular/Lymphatic: Aortic atherosclerosis. No enlarged abdominal or pelvic lymph nodes. Reproductive: Prostatomegaly. Other: Fat containing left inguinal hernia. Trace ascites throughout the abdomen and pelvis. Anasarca. Musculoskeletal: No acute or significant osseous findings. Chronic bilateral pars defects of L5. IMPRESSION: 1. The colon is diffusely distended and fluid-filled to the rectum, largest loops measuring 10.3 cm in caliber. In the setting of known C diff colitis, this is concerning for toxic megacolon. 2. There is a new small right pleural effusion and associated atelectasis or consolidation. There is incompletely imaged heterogeneous airspace opacity in the posterior right upper lobe. Findings are concerning for infection or aspiration. Consider dedicated imaging of the chest to further evaluate. 3. Trace ascites throughout the abdomen and pelvis. Anasarca. 4. Prostatomegaly. 5. Coronary artery disease. Aortic Atherosclerosis (ICD10-I70.0). Electronically Signed   By: Eddie Candle M.D.   On: 01/02/2021 15:16   DG ABD ACUTE 2+V W 1V CHEST  Result Date: 01/02/2021 CLINICAL DATA:  87 year old male with distended abdomen. EXAM: DG ABDOMEN ACUTE WITH 1 VIEW CHEST COMPARISON:  Abdominal series 12/30/2020 and earlier, including CT Abdomen and Pelvis 12/27/2020. FINDINGS: Semi upright portable view of the chest at 0816 hours. Mildly lower lung volumes. Stable cardiac size and mediastinal contours. Allowing for portable technique the lungs are clear. No pneumothorax or pneumoperitoneum. No acute osseous abnormality identified. Upright and supine views of the abdomen and pelvis. Increasing gaseous  distension of redundant colon, largely new from the CT 12/27/2020. Increased rectal gas now. Gas-filled but nondilated small bowel loops. No pneumoperitoneum. No acute osseous abnormality identified. IMPRESSION: 1. New and increasing gaseous distension of the colon since the CT 12/27/2020. Appearance favors progressive large bowel ileus. Developing toxic megacolon not excluded. 2. No free air. 3. No acute cardiopulmonary abnormality. Electronically Signed   By: Genevie Ann M.D.   On: 01/02/2021 09:01    Anti-infectives: Anti-infectives (From admission, onward)   Start     Dose/Rate Route Frequency Ordered Stop   01/02/21 1330  vancomycin (VANCOCIN) 500 mg in sodium chloride irrigation 0.9 % 100 mL ENEMA        500 mg Rectal Every 6 hours 01/02/21 1234 01/16/21 1159   12/30/20 1200  vancomycin (VANCOCIN) 50 mg/mL oral solution 500 mg  Status:  Discontinued        500 mg Oral Every 6 hours 12/30/20 0956 01/02/21 1234   12/30/20 1200  metroNIDAZOLE (FLAGYL) IVPB 500 mg        500 mg 100 mL/hr over 60 Minutes Intravenous Every 8 hours 12/30/20 0956     12/29/20 0600  vancomycin (VANCOREADY) IVPB 1250 mg/250 mL  Status:  Discontinued        1,250 mg 166.7 mL/hr  over 90 Minutes Intravenous Every 36 hours 12/27/20 2315 12/28/20 1007   12/28/20 2000  vancomycin (VANCOCIN) 50 mg/mL oral solution 125 mg  Status:  Discontinued        125 mg Oral Every 6 hours 12/28/20 1801 12/30/20 0956   12/28/20 0830  clindamycin (CLEOCIN) IVPB 900 mg  Status:  Discontinued       "And" Linked Group Details   900 mg 100 mL/hr over 30 Minutes Intravenous On call to O.R. 12/28/20 2122 12/29/20 0559   12/28/20 0830  gentamicin (GARAMYCIN) 360 mg in dextrose 5 % 100 mL IVPB  Status:  Discontinued       "And" Linked Group Details   5 mg/kg  71.2 kg 109 mL/hr over 60 Minutes Intravenous On call to O.R. 12/28/20 4825 12/29/20 0559   12/28/20 0600  ceFEPIme (MAXIPIME) 2 g in sodium chloride 0.9 % 100 mL IVPB  Status:   Discontinued        2 g 200 mL/hr over 30 Minutes Intravenous Every 12 hours 12/27/20 2315 12/29/20 0955   12/28/20 0200  metroNIDAZOLE (FLAGYL) IVPB 500 mg  Status:  Discontinued        500 mg 100 mL/hr over 60 Minutes Intravenous Every 8 hours 12/27/20 2202 12/29/20 0955   12/27/20 1830  aztreonam (AZACTAM) 2 g in sodium chloride 0.9 % 100 mL IVPB  Status:  Discontinued        2 g 200 mL/hr over 30 Minutes Intravenous  Once 12/27/20 1820 12/27/20 1823   12/27/20 1830  metroNIDAZOLE (FLAGYL) IVPB 500 mg        500 mg 100 mL/hr over 60 Minutes Intravenous  Once 12/27/20 1820 12/27/20 1935   12/27/20 1830  vancomycin (VANCOCIN) IVPB 1000 mg/200 mL premix        1,000 mg 200 mL/hr over 60 Minutes Intravenous  Once 12/27/20 1820 12/27/20 1934   12/27/20 1830  ceFEPIme (MAXIPIME) 2 g in sodium chloride 0.9 % 100 mL IVPB        2 g 200 mL/hr over 30 Minutes Intravenous  Once 12/27/20 1824 12/27/20 1903       Assessment/Plan CAD status post stenting to RCA in 1997 COPD CKD stage III- Cr 1.34  Hypertension Hyperlipidemia BPH  Cholelithiasis Chronic cholecystitis- follow up with Dr. Redmond Pulling as outpatient as C diff source of infection this admit. - postpone lap chole until outpatient  C. Diff colitis  - NPO - CT 5/23 shows colon diffusely distended concerning for toxic megacolon and he is diffusely tender on exam - vancomycin enemas started 5/23 - patient is a poor surgical candidate and palliative is following but he is not improving with non-operative management - will discuss with MD. Remain NPO   FEN: NPO, IVF VTE: SQ heparin - would not start heparin gtt yet ID: vanc 5/17; Cefepime/flagyl 5/17>5/19, gent/clinda 5/18; PO VANC 5/19>5/23,  flagyl 5/17 >>, Vanc enemas 5/23 >>     LOS: 7 days    Winferd Humphrey, [facility] 01/03/2021, 7:58 AM Please see Amion for pager number during day hours 7:00am-4:30pm

## 2021-01-03 NOTE — Progress Notes (Signed)
Daily Progress Note   Patient Name: Leon Estes       Date: 01/03/2021 DOB: Jan 09, 1931  Age: 87 y.o. MRN#: [id number] Attending Physician: Aline August, MD Primary Care Physician: Venia Carbon, MD Admit Date: 12/27/2020  Reason for Consultation/Follow-up: Establishing goals of care  Subjective: Wife and granddaughter are at bedside, patient is resting in bed, has abdominal distention, does not awaken, does not verbalize.     Length of Stay: 7  Current Medications: Scheduled Meds:  . aspirin EC  81 mg Oral Daily  . budesonide (PULMICORT) nebulizer solution  0.5 mg Nebulization BID  . Chlorhexidine Gluconate Cloth  6 each Topical Daily  . heparin  5,000 Units Subcutaneous Q8H  . ipratropium-albuterol  3 mL Nebulization BID  . mouth rinse  15 mL Mouth Rinse BID  . [START ON 01/04/2021] methylPREDNISolone (SOLU-MEDROL) injection  40 mg Intravenous Q24H  . metoprolol tartrate  25 mg Oral Q6H  . pantoprazole  40 mg Oral Daily  . simvastatin  40 mg Oral QPM  . sodium chloride flush  3 mL Intravenous Q12H  . tamsulosin  0.4 mg Oral Daily  . vancomycin (VANCOCIN) rectal ENEMA  500 mg Rectal Q6H    Continuous Infusions: . dextrose 5 % with kcl 75 mL/hr at 01/03/21 1158  . metronidazole Stopped (01/03/21 0533)  . potassium chloride 10 mEq (01/03/21 1205)    PRN Meds: acetaminophen, albuterol, haloperidol lactate, LORazepam, methocarbamol, metoprolol tartrate, ondansetron (ZOFRAN) IV  Physical Exam         General: does not awaken HEENT: No bruits, no goiter, no JVD Heart: Rate controlled currently. Lungs: shallow breath sounds Abdomen: Soft, globally tender, distended  Ext: No significant edema Skin: Warm and dry   Vital Signs: BP 124/69   Pulse 71   Temp (!) 96.4  F (35.8 C) (Axillary)   Resp 20   Ht 5\' 7"  (1.702 m)   Wt 72.2 kg   SpO2 100%   BMI 24.93 kg/m  SpO2: SpO2: 100 % O2 Device: O2 Device: Room Air O2 Flow Rate: O2 Flow Rate (L/min): 1 L/min  Intake/output summary:   Intake/Output Summary (Last 24 hours) at 01/03/2021 1222 Last data filed at 01/03/2021 1158 Gross per 24 hour  Intake 4661.35 ml  Output 250 ml  Net 4411.35 ml  LBM: Last BM Date: 01/03/21 Baseline Weight: Weight: 71.2 kg Most recent weight: Weight: 72.2 kg       Palliative Assessment/Data:    Flowsheet Rows   Flowsheet Row Most Recent Value  Intake Tab   Referral Department Hospitalist  Unit at Time of Referral ICU  Palliative Care Primary Diagnosis Sepsis/Infectious Disease  Date Notified 12/30/20  Palliative Care Type New Palliative care  Reason for referral Clarify Goals of Care  Date of Admission 12/27/20  Date first seen by Palliative Care 12/31/20  # of days Palliative referral response time 1 Day(s)  # of days IP prior to Palliative referral 3  Clinical Assessment   Palliative Performance Scale Score 20%  Psychosocial & Spiritual Assessment   Palliative Care Outcomes   Patient/Family meeting held? Yes  Who was at the meeting? Wife, son, daughter  Palliative Care Outcomes Clarified goals of care      Patient Active Problem List   Diagnosis Date Noted  . Colitis due to Clostridioides difficile 12/31/2020  . Acute delirium 12/31/2020  . HOH (hard of hearing) 12/28/2020  . Acute on chronic cholecystitis 12/28/2020  . Hypokalemia 12/28/2020  . Severe sepsis ([place]) 12/27/2020  . Arrhythmia 12/06/2020  . Pulmonary nodule 12/06/2020  . Acute cholecystitis 11/26/2020  . Preventative health care 10/12/2020  . Macular degeneration, wet ([place]) 10/12/2020  . Acquired claw toe, left 02/02/2020  . BPH (benign prostatic hyperplasia) 01/13/2020  . Neuropathy 02/18/2018  . COPD (chronic obstructive pulmonary disease) ([place])   . Chronic renal  disease, stage III ([place])   . Benign essential HTN 04/09/2017  . CAD (coronary artery disease) 03/03/2015  . Hyperlipidemia 03/03/2015    Palliative Care Assessment & Plan   Patient Profile: 87 y.o. male  with past medical history of CHF, COPD, choledocholithiasis, CAD status post PCI, COPD, CKD, hypertension, hyperlipidemia, BPH admitted on 12/27/2020 with sepsis.  He was recently admitted to [facility] 4/16 through 4/19 for presumed cholecystitis.  He was being followed by Dr. Redmond Pulling for cholecystectomy later this month.  Initially, there was concern that his presentation was related to cholecystitis again, however, with further work-up it appears that this is actually more from C. difficile.  His hospital course has been further complicated by A. fib, COPD exacerbation, and development of agitation/delirium.  Palliative consulted for goals of care.  Recommendations/Plan:  DNR/DNI  Continue current interventions for now, surgery following, discussed with the wife about CT scan results being concerning for toxic megacolon, and that there is opinion that the patient is a poor surgical candidate, she agrees and would not want surgery, request for continuation of current mode of care.  Gently discussed with her that the patient remains at high risk for ongoing decline and decompensation, she becomes tearful and states she is aware. Standard delirium management (adapted from NICE guidelines 2011 for prevention of delirium):  Provide continuity of care when possible (avoid frequent changing of surroundings and staff).  Frequent reorientation to time with:  A clock should always be visible.  Make sure Calendar/white board is updated.  Lights on/blinds open during the day and off/closed at night.  Encourage frequent family visits.  Monitor for and treat dehydration/constipation.  Optimize oxygen saturation.  Avoid catheters and IV's when possible and look for/treat infections.  Encourage early mobility.   Assess and treat pain.  Ensure adequate nutrition and functioning dentures.  Address reversible causes of hearing and visual impairment:  Use pocket talker if hearing aids are unavailable.  Avoid sleep disturbance (  normalize sleep/wake cycle).  Minimize disturbances and consider NOT obtaining vitals at night if possible.  Review Medications to avoid polypharmacy and avoid deliriogenic medications when possible:  Benzodiazepines.  Dihydropyridines.  Antihistamines.  Anticholinergics.  (Possibly avoid: H2 blockers, tricyclic antidepressants, antiparkinson medications, steroids, NSAID's).   Code Status:    Code Status Orders  (From admission, onward)         Start     Ordered   12/29/20 1002  Do not attempt resuscitation (DNR)  Continuous       Question Answer Comment  In the event of cardiac or respiratory ARREST Do not call a "code blue"   In the event of cardiac or respiratory ARREST Do not perform Intubation, CPR, defibrillation or ACLS   In the event of cardiac or respiratory ARREST Use medication by any route, position, wound care, and other measures to relive pain and suffering. May use oxygen, suction and manual treatment of airway obstruction as needed for comfort.      12/29/20 1001        Code Status History    Date Active Date Inactive Code Status Order ID Comments User Context   12/27/2020 2202 12/29/2020 1001 Full Code [id number]  Lenore Cordia, MD ED   11/26/2020 1947 11/29/2020 1648 Full Code [id number]  Kayleen Memos, DO ED   Advance Care Planning Activity       Prognosis:  Guarded There is high likelihood that this could be a terminal admission. Discharge Planning:  To Be Determined  Care plan was discussed with  wife and granddaughter present at bedside  Thank you for allowing the Palliative Medicine Team to assist in the care of this patient.   Total Time 25 Prolonged Time Billed No   Greater than 50%  of this time was spent counseling and  coordinating care related to the above assessment and plan.  Loistine Chance, MD  Please contact Palliative Medicine Team phone at [phone] for questions and concerns.

## 2021-01-03 NOTE — Progress Notes (Signed)
SLP Cancellation Note  Patient Details Name: KAIRON SHOCK MRN: [id number] DOB: 06-May-1931   Cancelled treatment:       Reason Eval/Treat Not Completed: Medical issues which prohibited therapy. Pt has been made NPO by surgery. Will hold SLP and PO trials for today, but will continue to follow.    Osie Bond., M.A. [facility] Pager [phone] Office [phone]  01/03/2021, 8:25 AM

## 2021-01-03 NOTE — Progress Notes (Signed)
Patient ID: Leon Estes, male   DOB: 07/18/1931, 87 y.o.   MRN: [id number]  PROGRESS NOTE    Leon Estes  YQM:[id number] DOB: [birth year] DOA: 12/27/2020 PCP: Venia Carbon, MD   Brief Narrative:  87 year old male with history of CAD status post stenting to RCA in 1997, COPD, CKD stage III, hypertension, hyperlipidemia, BPH, recent admission at [facility] from 11/26/2020-11/29/2020 with presumed acute cholecystitis (RUQ ultrasound and CT abdomen/pelvis were equivocal.  HIDA scan was consistent with cystic duct obstruction) treated medically with antibiotics and he was followed up by general surgery as an outpatient with plans for laparoscopic cholecystectomy on 01/10/2021.  He presented on 12/27/2020 with fever and generalized weakness.  On presentation, temperature was 103.1 F rectally along with tachypnea and tachycardia, WBC of 15.3, creatinine of 1.48, normal LFTs, lactic acid of 2.8 and then 3.2 and BNP of 689.3.  COVID-19 and influenza tests were negative.  Chest x-ray was negative for focal consolidation. CT abdomen/pelvis without contrast showed prostatomegaly, stable appearance of the gallbladder with mild stable central intrahepatic biliary dilatation, and extensive chronic and degenerative changes within the lower lumbar spine.  He was started on IV fluids and antibiotics.  General surgery was consulted.  Initially plan was for surgical intervention but it was postponed once stool tested positive for C. difficile and patient was started on oral vancomycin.  Hospital course has been complicated by delirium/agitation along with A. fib with RVR.  Cardiology and palliative care were also been consulted.  Assessment & Plan:   Severe sepsis, present on admission  Severe [name] Leukocytosis: Resolved ?  Acute on chronic cholecystitis Elevated LFTs -Patient was recently hospitalized at [facility] with presumed cholecystitis treated with antibiotics with outpatient follow-up with general  surgery with plans for laparoscopic cholecystectomy on 01/10/2021. -Presented with temperature of 102.1 F rectally along with tachypnea, tachycardia, WBC of 15.3, elevated lactic acid.  No clear-cut source of infection with negative chest x-ray. COVID-19 and influenza tests were negative.  - CT abdomen/pelvis showed stable appearance of the gallbladder with mild stable central intrahepatic biliary dilatation.  Right upper quadrant ultrasound shows cholelithiasis without acute cholecystitis.  LFTs trending downwards. -WBCs have normalized. -General surgery initially were planning for surgical intervention on 12/28/2020 but surgery was postponed because of C. difficile.  - General surgery following: CT of the abdomen pelvis on 12/30/2020 showed diffusely distended colon with concern for toxic megacolon.  Follow general surgery's recommendations.  Oral vancomycin was changed to vancomycin enema by general surgery on 12/30/2020.  Continue IV Flagyl. -N.p.o. for now.  Paroxysmal A. fib with RVR -Not on anticoagulation as an outpatient.  -Rate better controlled currently.  Cardiology signed off on 01/01/2021.  Continue oral metoprolol if able to tolerate.  Use as needed IV metoprolol. -Echo as below shows EF of 50 to 55%  Delirium/confusion -Required Haldol again yesterday afternoon.  Continue as needed Haldol and Ativan for severe agitation -vitamin B28, folic acid and TSH levels normal  CAD status post PCI -Currently stable.  No chest pain.  Continue aspirin, metoprolol, simvastatin.  Outpatient follow-up with cardiology  COPD Acute hypoxic respiratory failure -Improved.  Currently on room air. -Decrease Solu-Medrol to 40 mg IV every 24 hours.  Continue nebs and start Pulmicort.  Hypokalemia -Replace.  Repeat potassium level this afternoon and tomorrow morning.  Hypernatremia -Resolved.  Continue gentle hydration with D5.  Chronic kidney disease stage IIIa -Stable.   Monitor  Hypertension: -Blood pressure on the high side.  Continue  increased dose of Toprol  Hyperlipidemia: -Continue simvastatin if able to take orally.  BPH: -Continue tamsulosin able to take orally.  Generalized deconditioning -will need PT eval once more stable. -Palliative care following.  Patient has worsening colonic distention with possible toxic megacolon on top of chronic cholecystitis and I doubt that patient is any kind of surgical candidate; with intermittent delirium and agitation, his overall prognosis looks very poor at this time.   -If condition does not improve, recommend hospice/comfort measures  DVT prophylaxis: Subcutaneous heparin Code Status: DNR Family Communication: Spoke to wife and daughter at bedside on 12/30/2020 Disposition Plan: Status is: Inpatient  Remains inpatient appropriate because:Inpatient level of care appropriate due to severity of illness   Dispo: The patient is from: Home              Anticipated d/c is to: Home              Patient currently is not medically stable to d/c.   Difficult to place patient No  Consultants: General surgery/cardiology/palliative care  Procedures: Echo  Antimicrobials:  Anti-infectives (From admission, onward)   Start     Dose/Rate Route Frequency Ordered Stop   01/02/21 1330  vancomycin (VANCOCIN) 500 mg in sodium chloride irrigation 0.9 % 100 mL ENEMA        500 mg Rectal Every 6 hours 01/02/21 1234 01/16/21 1159   12/30/20 1200  vancomycin (VANCOCIN) 50 mg/mL oral solution 500 mg  Status:  Discontinued        500 mg Oral Every 6 hours 12/30/20 0956 01/02/21 1234   12/30/20 1200  metroNIDAZOLE (FLAGYL) IVPB 500 mg        500 mg 100 mL/hr over 60 Minutes Intravenous Every 8 hours 12/30/20 0956     12/29/20 0600  vancomycin (VANCOREADY) IVPB 1250 mg/250 mL  Status:  Discontinued        1,250 mg 166.7 mL/hr over 90 Minutes Intravenous Every 36 hours 12/27/20 2315 12/28/20 1007   12/28/20 2000   vancomycin (VANCOCIN) 50 mg/mL oral solution 125 mg  Status:  Discontinued        125 mg Oral Every 6 hours 12/28/20 1801 12/30/20 0956   12/28/20 0830  clindamycin (CLEOCIN) IVPB 900 mg  Status:  Discontinued       "And" Linked Group Details   900 mg 100 mL/hr over 30 Minutes Intravenous On call to O.R. 12/28/20 3810 12/29/20 0559   12/28/20 0830  gentamicin (GARAMYCIN) 360 mg in dextrose 5 % 100 mL IVPB  Status:  Discontinued       "And" Linked Group Details   5 mg/kg  71.2 kg 109 mL/hr over 60 Minutes Intravenous On call to O.R. 12/28/20 1751 12/29/20 0559   12/28/20 0600  ceFEPIme (MAXIPIME) 2 g in sodium chloride 0.9 % 100 mL IVPB  Status:  Discontinued        2 g 200 mL/hr over 30 Minutes Intravenous Every 12 hours 12/27/20 2315 12/29/20 0955   12/28/20 0200  metroNIDAZOLE (FLAGYL) IVPB 500 mg  Status:  Discontinued        500 mg 100 mL/hr over 60 Minutes Intravenous Every 8 hours 12/27/20 2202 12/29/20 0955   12/27/20 1830  aztreonam (AZACTAM) 2 g in sodium chloride 0.9 % 100 mL IVPB  Status:  Discontinued        2 g 200 mL/hr over 30 Minutes Intravenous  Once 12/27/20 1820 12/27/20 1823   12/27/20 1830  metroNIDAZOLE (FLAGYL) IVPB 500 mg  500 mg 100 mL/hr over 60 Minutes Intravenous  Once 12/27/20 1820 12/27/20 1935   12/27/20 1830  vancomycin (VANCOCIN) IVPB 1000 mg/200 mL premix        1,000 mg 200 mL/hr over 60 Minutes Intravenous  Once 12/27/20 1820 12/27/20 1934   12/27/20 1830  ceFEPIme (MAXIPIME) 2 g in sodium chloride 0.9 % 100 mL IVPB        2 g 200 mL/hr over 30 Minutes Intravenous  Once 12/27/20 1824 12/27/20 1903       Subjective: Patient seen and examined at bedside.  No nausea or vomiting reported.  Required Haldol again yesterday afternoon as per nursing staff.  Objective: Vitals:   01/03/21 0323 01/03/21 0400 01/03/21 0438 01/03/21 0600  BP:  119/61  130/63  Pulse:  85  75  Resp:  (!) 21  (!) 28  Temp: 98 F (36.7 C)     TempSrc: Axillary      SpO2:  94%  94%  Weight:   72.2 kg   Height:        Intake/Output Summary (Last 24 hours) at 01/03/2021 0730 Last data filed at 01/03/2021 0710 Gross per 24 hour  Intake 3871.49 ml  Output 250 ml  Net 3621.49 ml   Filed Weights   01/01/21 0353 01/02/21 0308 01/03/21 0438  Weight: 73.3 kg 71.3 kg 72.2 kg    Examination:  General exam: Looks chronically ill and deconditioned.  Currently on room air.  Extremely ill looking elderly gentleman lying in bed  respiratory system: Bilateral decreased breath sounds at bases with some crackles and intermittent tachypnea  cardiovascular system: Currently rate controlled; S1-S2 heard  gastrointestinal system: Abdomen is significantly distended, soft and mildly tender diffusely.  Sluggish bowel sounds extremities: Trace lower extremity edema present; no clubbing Central nervous system: Drowsy.  No focal neurological deficits.  Moving extremities skin: No obvious petechiae/rashes  psychiatry: Could not be assessed because of mental status   Data Reviewed: I have personally reviewed following labs and imaging studies  CBC: Recent Labs  Lab 12/30/20 0751 12/31/20 0204 01/01/21 0237 01/02/21 1103 01/03/21 0240  WBC 12.2* 12.0* 14.1* 8.6 8.7  NEUTROABS 9.2* 10.3* 12.1* 7.3 7.3  HGB 11.1* 10.1* 10.6* 10.6* 10.1*  HCT 37.0* 33.0* 34.8* 33.5* 32.0*  MCV 93.9 91.7 91.8 89.6 89.6  PLT 234 232 265 265 161   Basic Metabolic Panel: Recent Labs  Lab 12/30/20 0751 12/31/20 0204 01/01/21 0237 01/02/21 1103 01/02/21 1906 01/03/21 0240  NA 143 148* 149* 141  --  140  K 4.2 3.8 3.2* 2.5* 2.8* 2.7*  CL 116* 117* 118* 115*  --  115*  CO2 18* 15* 24 22  --  17*  GLUCOSE 113* 125* 184* 246*  --  219*  BUN 35* 51* 60* 42*  --  40*  CREATININE 1.24 1.47* 1.35* 1.02  --  1.07  CALCIUM 8.4* 9.0 9.1 8.4*  --  8.3*  MG 2.2 2.4 2.4 2.0 2.1 2.1   GFR: Estimated Creatinine Clearance: 45.5 mL/min (by C-G formula based on SCr of 1.07 mg/dL). Liver  Function Tests: Recent Labs  Lab 12/30/20 0751 12/31/20 0204 01/01/21 0237 01/02/21 1103 01/03/21 0240  AST 51* 86* 93* 45* 35  ALT 29 38 55* 54* 48*  ALKPHOS 51 62 70 55 50  BILITOT 0.7 0.9 0.7 0.6 0.5  PROT 6.5 6.6 6.9 6.1* 5.3*  ALBUMIN 3.0* 3.2* 3.4* 3.1* 2.9*   Recent Labs  Lab 12/27/20 1752  LIPASE 22  Recent Labs  Lab 12/30/20 0751  AMMONIA 14   Coagulation Profile: Recent Labs  Lab 12/27/20 1752  INR 1.3*   Cardiac Enzymes: No results for input(s): CKTOTAL, CKMB, CKMBINDEX, TROPONINI in the last 168 hours. BNP (last 3 results) No results for input(s): PROBNP in the last 8760 hours. HbA1C: No results for input(s): HGBA1C in the last 72 hours. CBG: No results for input(s): GLUCAP in the last 168 hours. Lipid Profile: No results for input(s): CHOL, HDL, LDLCALC, TRIG, CHOLHDL, LDLDIRECT in the last 72 hours. Thyroid Function Tests: No results for input(s): TSH, T4TOTAL, FREET4, T3FREE, THYROIDAB in the last 72 hours. Anemia Panel: No results for input(s): VITAMINB12, FOLATE, FERRITIN, TIBC, IRON, RETICCTPCT in the last 72 hours. Sepsis Labs: Recent Labs  Lab 12/27/20 1752 12/27/20 1952 12/27/20 2132 12/28/20 0514  PROCALCITON  --   --   --  29.44  LATICACIDVEN 3.2* 2.8* 2.5*  --     Recent Results (from the past 240 hour(s))  Blood Culture (routine x 2)     Status: None   Collection Time: 12/27/20  5:52 PM   Specimen: BLOOD  Result Value Ref Range Status   Specimen Description   Final    BLOOD SITE NOT SPECIFIED Performed at [facility] [address], [place], [place] [address]    Special Requests   Final    BOTTLES DRAWN AEROBIC AND ANAEROBIC Blood Culture adequate volume Performed at [facility] [address] [place], [place] [address]    Culture   Final    NO GROWTH 5 DAYS Performed at [facility], [place] [address] [place], [place] [address]    Report Status 01/01/2021 FINAL  Final  Blood Culture  (routine x 2)     Status: None   Collection Time: 12/27/20  6:19 PM   Specimen: BLOOD  Result Value Ref Range Status   Specimen Description   Final    BLOOD LEFT ANTECUBITAL Performed at [facility] [address] [place], [place] [address]    Special Requests   Final    BOTTLES DRAWN AEROBIC AND ANAEROBIC Blood Culture results may not be optimal due to an inadequate volume of blood received in culture bottles Performed at [facility] [address] [place], [place] [address]    Culture   Final    NO GROWTH 5 DAYS Performed at [facility], [place] [address], [place], [place] [address]    Report Status 01/01/2021 FINAL  Final  Resp Panel by RT-PCR (Flu A&B, Covid) Nasopharyngeal Swab     Status: None   Collection Time: 12/27/20  6:21 PM   Specimen: Nasopharyngeal Swab; Nasopharyngeal(NP) swabs in vial transport medium  Result Value Ref Range Status   SARS Coronavirus 2 by RT PCR NEGATIVE NEGATIVE Final    Comment: (NOTE) SARS-CoV-2 target nucleic acids are NOT DETECTED.  The SARS-CoV-2 RNA is generally detectable in upper respiratory specimens during the acute phase of infection. The lowest concentration of SARS-CoV-2 viral copies this assay can detect is 138 copies/mL. A negative result does not preclude SARS-Cov-2 infection and should not be used as the sole basis for treatment or other patient management decisions. A negative result may occur with  improper specimen collection/handling, submission of specimen other than nasopharyngeal swab, presence of viral mutation(s) within the areas targeted by this assay, and inadequate number of viral copies(<138 copies/mL). A negative result must be combined with clinical observations, patient  history, and epidemiological information. The expected result is Negative.  Fact Sheet for Patients:  EntrepreneurPulse.com.au  Fact Sheet for Healthcare Providers:   IncredibleEmployment.be  This test is no t yet approved or cleared by the [place] FDA and  has been authorized for detection and/or diagnosis of SARS-CoV-2 by FDA under an Emergency Use Authorization (EUA). This EUA will remain  in effect (meaning this test can be used) for the duration of the COVID-19 declaration under Section 564(b)(1) of the Act, 21 U.S.C.section 360bbb-3(b)(1), unless the authorization is terminated  or revoked sooner.       Influenza A by PCR NEGATIVE NEGATIVE Final   Influenza B by PCR NEGATIVE NEGATIVE Final    Comment: (NOTE) The Xpert Xpress SARS-CoV-2/FLU/RSV plus assay is intended as an aid in the diagnosis of influenza from Nasopharyngeal swab specimens and should not be used as a sole basis for treatment. Nasal washings and aspirates are unacceptable for Xpert Xpress SARS-CoV-2/FLU/RSV testing.  Fact Sheet for Patients: EntrepreneurPulse.com.au  Fact Sheet for Healthcare Providers: IncredibleEmployment.be  This test is not yet approved or cleared by the [place] FDA and has been authorized for detection and/or diagnosis of SARS-CoV-2 by FDA under an Emergency Use Authorization (EUA). This EUA will remain in effect (meaning this test can be used) for the duration of the COVID-19 declaration under Section 564(b)(1) of the Act, 21 U.S.C. section 360bbb-3(b)(1), unless the authorization is terminated or revoked.  Performed at [facility], [place] [address] [place], [place] [address]   Urine culture     Status: Abnormal   Collection Time: 12/27/20 10:14 PM   Specimen: In/Out Cath Urine  Result Value Ref Range Status   Specimen Description   Final    IN/OUT CATH URINE Performed at [facility] [address] [place], [place] [address]    Special Requests   Final    NONE Performed at [facility], [place] [address]  [place], [place] [address]    Culture MULTIPLE SPECIES PRESENT, SUGGEST RECOLLECTION (A)  Final   Report Status 12/29/2020 FINAL  Final  MRSA PCR Screening     Status: Abnormal   Collection Time: 12/28/20  8:46 AM   Specimen: Nasal Mucosa; Nasopharyngeal  Result Value Ref Range Status   MRSA by PCR POSITIVE (A) NEGATIVE Final    Comment:        The GeneXpert MRSA Assay (FDA approved for NASAL specimens only), is one component of a comprehensive MRSA colonization surveillance program. It is not intended to diagnose MRSA infection nor to guide or monitor treatment for MRSA infections. RESULT CALLED TO, READ BACK BY AND VERIFIED WITH: Leida Lauth RN AT 1112 12/28/20 MULLINS,T Performed at [facility], [place] [address], [place], [place] [address]   C Difficile Quick Screen w PCR reflex     Status: Abnormal   Collection Time: 12/28/20 11:43 AM   Specimen: STOOL  Result Value Ref Range Status   C Diff antigen POSITIVE (A) NEGATIVE Final   C Diff toxin POSITIVE (A) NEGATIVE Final   C Diff interpretation Toxin producing C. difficile detected.  Final    Comment: CRITICAL RESULT CALLED TO, READ BACK BY AND VERIFIED WITH: L.CAUDLE, RN AT 1757 ON 05.18.22 BY N.THOMPSON Performed at [facility] [address] [place], [place] [address]   Gastrointestinal Panel by PCR , Stool     Status: None   Collection Time: 12/28/20 11:43 AM   Specimen: STOOL  Result Value Ref Range Status   Campylobacter species NOT DETECTED NOT DETECTED Final   Plesimonas shigelloides NOT DETECTED NOT DETECTED Final   Salmonella species NOT DETECTED NOT DETECTED Final   Yersinia enterocolitica NOT DETECTED NOT DETECTED Final   Vibrio species NOT DETECTED NOT DETECTED Final   Vibrio cholerae NOT DETECTED NOT DETECTED Final   Enteroaggregative E coli (EAEC) NOT DETECTED NOT DETECTED Final   Enteropathogenic E coli (EPEC) NOT DETECTED NOT DETECTED Final   Enterotoxigenic E coli  (ETEC) NOT DETECTED NOT DETECTED Final   Shiga like toxin producing E coli (STEC) NOT DETECTED NOT DETECTED Final   Shigella/Enteroinvasive E coli (EIEC) NOT DETECTED NOT DETECTED Final   Cryptosporidium NOT DETECTED NOT DETECTED Final   Cyclospora cayetanensis NOT DETECTED NOT DETECTED Final   Entamoeba histolytica NOT DETECTED NOT DETECTED Final   Giardia lamblia NOT DETECTED NOT DETECTED Final   Adenovirus F40/41 NOT DETECTED NOT DETECTED Final   Astrovirus NOT DETECTED NOT DETECTED Final   Norovirus GI/GII NOT DETECTED NOT DETECTED Final   Rotavirus A NOT DETECTED NOT DETECTED Final   Sapovirus (I, II, IV, and V) NOT DETECTED NOT DETECTED Final    Comment: Performed at [facility], [address], [place], [place] [address]         Radiology Studies: CT ABDOMEN PELVIS WO CONTRAST  Result Date: 01/02/2021 CLINICAL DATA:  Abdominal distension, bowel obstruction suspected, infectious gastroenteritis or colitis, possible toxic megacolon, C diff EXAM: CT ABDOMEN AND PELVIS WITHOUT CONTRAST TECHNIQUE: Multidetector CT imaging of the abdomen and pelvis was performed following the standard protocol without IV contrast. COMPARISON:  12/27/2020 FINDINGS: Lower chest: There is a new small right pleural effusion and associated atelectasis or consolidation. There is incompletely imaged heterogeneous airspace opacity in the posterior right upper lobe (series 7, image 1). Coronary artery calcifications. Hepatobiliary: No solid liver abnormality is seen. No gallstones, gallbladder wall thickening, or biliary dilatation. Pancreas: Unremarkable. No pancreatic ductal dilatation or surrounding inflammatory changes. Spleen: Normal in size without significant abnormality. Adrenals/Urinary Tract: Adrenal glands are unremarkable. Fluid attenuation cyst of the superior pole of the left kidney. Kidneys are otherwise normal, without renal calculi, solid lesion, or hydronephrosis. Bladder is unremarkable.  Stomach/Bowel: Stomach is within normal limits. Appendix appears normal. The colon is diffusely distended and fluid-filled to the rectum, largest loops measuring 10.3 cm in caliber. Vascular/Lymphatic: Aortic atherosclerosis. No enlarged abdominal or pelvic lymph nodes. Reproductive: Prostatomegaly. Other: Fat containing left inguinal hernia. Trace ascites throughout the abdomen and pelvis. Anasarca. Musculoskeletal: No acute or significant osseous findings. Chronic bilateral pars defects of L5. IMPRESSION: 1. The colon is diffusely distended and fluid-filled to the rectum, largest loops measuring 10.3 cm in caliber. In the setting of known C diff colitis, this is concerning for toxic megacolon. 2. There is a new small right pleural effusion and associated atelectasis or consolidation. There is incompletely imaged heterogeneous airspace opacity in the posterior right upper lobe. Findings are concerning for infection or aspiration. Consider dedicated imaging of the chest to further evaluate. 3. Trace ascites throughout the abdomen and pelvis. Anasarca. 4. Prostatomegaly. 5. Coronary artery disease. Aortic Atherosclerosis (ICD10-I70.0). Electronically Signed   By: Eddie Candle M.D.   On: 01/02/2021 15:16   DG ABD ACUTE 2+V W 1V CHEST  Result Date: 01/02/2021 CLINICAL DATA:  87 year old male with distended abdomen. EXAM: DG ABDOMEN ACUTE WITH 1 VIEW CHEST COMPARISON:  Abdominal series 12/30/2020 and earlier, including CT Abdomen and Pelvis 12/27/2020. FINDINGS: Semi upright portable  view of the chest at 0816 hours. Mildly lower lung volumes. Stable cardiac size and mediastinal contours. Allowing for portable technique the lungs are clear. No pneumothorax or pneumoperitoneum. No acute osseous abnormality identified. Upright and supine views of the abdomen and pelvis. Increasing gaseous distension of redundant colon, largely new from the CT 12/27/2020. Increased rectal gas now. Gas-filled but nondilated small bowel  loops. No pneumoperitoneum. No acute osseous abnormality identified. IMPRESSION: 1. New and increasing gaseous distension of the colon since the CT 12/27/2020. Appearance favors progressive large bowel ileus. Developing toxic megacolon not excluded. 2. No free air. 3. No acute cardiopulmonary abnormality. Electronically Signed   By: Genevie Ann M.D.   On: 01/02/2021 09:01        Scheduled Meds: . aspirin EC  81 mg Oral Daily  . budesonide (PULMICORT) nebulizer solution  0.5 mg Nebulization BID  . Chlorhexidine Gluconate Cloth  6 each Topical Daily  . heparin  5,000 Units Subcutaneous Q8H  . ipratropium-albuterol  3 mL Nebulization BID  . mouth rinse  15 mL Mouth Rinse BID  . methylPREDNISolone (SOLU-MEDROL) injection  40 mg Intravenous Q12H  . metoprolol tartrate  25 mg Oral Q6H  . pantoprazole  40 mg Oral Daily  . simvastatin  40 mg Oral QPM  . sodium chloride flush  3 mL Intravenous Q12H  . tamsulosin  0.4 mg Oral Daily  . vancomycin (VANCOCIN) rectal ENEMA  500 mg Rectal Q6H   Continuous Infusions: . dextrose 5 % with kcl 75 mL/hr at 01/03/21 0710  . metronidazole Stopped (01/03/21 0533)  . potassium chloride Stopped (01/03/21 0728)          Aline August, MD [facility] 01/03/2021, 7:30 AM

## 2021-01-04 ENCOUNTER — Inpatient Hospital Stay (HOSPITAL_COMMUNITY): Payer: PPO

## 2021-01-04 DIAGNOSIS — Z7189 Other specified counseling: Secondary | ICD-10-CM | POA: Diagnosis not present

## 2021-01-04 DIAGNOSIS — Z515 Encounter for palliative care: Secondary | ICD-10-CM | POA: Diagnosis not present

## 2021-01-04 DIAGNOSIS — R410 Disorientation, unspecified: Secondary | ICD-10-CM | POA: Diagnosis not present

## 2021-01-04 DIAGNOSIS — R531 Weakness: Secondary | ICD-10-CM | POA: Diagnosis not present

## 2021-01-04 DIAGNOSIS — K5931 Toxic megacolon: Secondary | ICD-10-CM | POA: Diagnosis not present

## 2021-01-04 DIAGNOSIS — A0472 Enterocolitis due to Clostridium difficile, not specified as recurrent: Secondary | ICD-10-CM | POA: Diagnosis not present

## 2021-01-04 DIAGNOSIS — K812 Acute cholecystitis with chronic cholecystitis: Secondary | ICD-10-CM | POA: Diagnosis not present

## 2021-01-04 LAB — CBC WITH DIFFERENTIAL/PLATELET
Abs Immature Granulocytes: 0.26 10*3/uL — ABNORMAL HIGH (ref 0.00–0.07)
Basophils Absolute: 0 10*3/uL (ref 0.0–0.1)
Basophils Relative: 0 %
Eosinophils Absolute: 0 10*3/uL (ref 0.0–0.5)
Eosinophils Relative: 0 %
HCT: 33.2 % — ABNORMAL LOW (ref 39.0–52.0)
Hemoglobin: 10.5 g/dL — ABNORMAL LOW (ref 13.0–17.0)
Immature Granulocytes: 2 %
Lymphocytes Relative: 7 %
Lymphs Abs: 0.7 10*3/uL (ref 0.7–4.0)
MCH: 28 pg (ref 26.0–34.0)
MCHC: 31.6 g/dL (ref 30.0–36.0)
MCV: 88.5 fL (ref 80.0–100.0)
Monocytes Absolute: 1 10*3/uL (ref 0.1–1.0)
Monocytes Relative: 9 %
Neutro Abs: 8.8 10*3/uL — ABNORMAL HIGH (ref 1.7–7.7)
Neutrophils Relative %: 82 %
Platelets: 248 10*3/uL (ref 150–400)
RBC: 3.75 MIL/uL — ABNORMAL LOW (ref 4.22–5.81)
RDW: 14.6 % (ref 11.5–15.5)
WBC: 10.8 10*3/uL — ABNORMAL HIGH (ref 4.0–10.5)
nRBC: 0 % (ref 0.0–0.2)

## 2021-01-04 LAB — COMPREHENSIVE METABOLIC PANEL
ALT: 41 U/L (ref 0–44)
AST: 27 U/L (ref 15–41)
Albumin: 2.9 g/dL — ABNORMAL LOW (ref 3.5–5.0)
Alkaline Phosphatase: 47 U/L (ref 38–126)
Anion gap: 6 (ref 5–15)
BUN: 36 mg/dL — ABNORMAL HIGH (ref 8–23)
CO2: 19 mmol/L — ABNORMAL LOW (ref 22–32)
Calcium: 8.5 mg/dL — ABNORMAL LOW (ref 8.9–10.3)
Chloride: 114 mmol/L — ABNORMAL HIGH (ref 98–111)
Creatinine, Ser: 1.07 mg/dL (ref 0.61–1.24)
GFR, Estimated: >60 mL/min (ref >60)
Glucose, Bld: 187 mg/dL — ABNORMAL HIGH (ref 70–99)
Potassium: 3.5 mmol/L (ref 3.5–5.1)
Sodium: 139 mmol/L (ref 135–145)
Total Bilirubin: 0.8 mg/dL (ref 0.3–1.2)
Total Protein: 5.4 g/dL — ABNORMAL LOW (ref 6.5–8.1)

## 2021-01-04 LAB — MAGNESIUM: Magnesium: 2.2 mg/dL (ref 1.7–2.4)

## 2021-01-04 LAB — LACTIC ACID, PLASMA: Lactic Acid, Venous: 1.2 mmol/L (ref 0.5–1.9)

## 2021-01-04 NOTE — Progress Notes (Signed)
Patient ID: Leon Estes, male   DOB: 28-Oct-1930, 87 y.o.   MRN: [id number]  PROGRESS NOTE    SLOAN TAKAGI  OVF:[id number] DOB: September 28, 1930 DOA: 12/27/2020 PCP: Venia Carbon, MD   Brief Narrative:  87 year old male with history of CAD status post stenting to RCA in 1997, COPD, CKD stage III, hypertension, hyperlipidemia, BPH, recent admission at [facility] from 11/26/2020-11/29/2020 with presumed acute cholecystitis (RUQ ultrasound and CT abdomen/pelvis were equivocal.  HIDA scan was consistent with cystic duct obstruction) treated medically with antibiotics and he was followed up by general surgery as an outpatient with plans for laparoscopic cholecystectomy on 01/10/2021.  He presented on 12/27/2020 with fever and generalized weakness.  On presentation, temperature was 103.1 F rectally along with tachypnea and tachycardia, WBC of 15.3, creatinine of 1.48, normal LFTs, lactic acid of 2.8 and then 3.2 and BNP of 689.3.  COVID-19 and influenza tests were negative.  Chest x-ray was negative for focal consolidation. CT abdomen/pelvis without contrast showed prostatomegaly, stable appearance of the gallbladder with mild stable central intrahepatic biliary dilatation, and extensive chronic and degenerative changes within the lower lumbar spine.  He was started on IV fluids and antibiotics.  General surgery was consulted.  Initially plan was for surgical intervention but it was postponed once stool tested positive for C. difficile and patient was started on oral vancomycin.  Hospital course has been complicated by delirium/agitation along with A. fib with RVR.  Cardiology and palliative care were also been consulted.  Recently, patient has had issues with increasing abdominal distention with imaging showing possible toxic megacolon.  Oral vancomycin has been changed to vancomycin enema.  Assessment & Plan:   Severe sepsis, present on admission  Severe [name] Leukocytosis: Resolved ?  Acute on  chronic cholecystitis Elevated LFTs -Patient was recently hospitalized at [facility] with presumed cholecystitis treated with antibiotics with outpatient follow-up with general surgery with plans for laparoscopic cholecystectomy on 01/10/2021. -Presented with temperature of 102.1 F rectally along with tachypnea, tachycardia, WBC of 15.3, elevated lactic acid.  No clear-cut source of infection with negative chest x-ray. COVID-19 and influenza tests were negative.  - CT abdomen/pelvis showed stable appearance of the gallbladder with mild stable central intrahepatic biliary dilatation.  Right upper quadrant ultrasound shows cholelithiasis without acute cholecystitis.  LFTs trending downwards. -WBCs have normalized. -General surgery initially were planning for surgical intervention on 12/28/2020 but surgery was postponed because of C. difficile.  - General surgery following: CT of the abdomen pelvis on 12/30/2020 showed diffusely distended colon with concern for toxic megacolon.  Family still contemplating surgical intervention versus no intervention.  Patient is high risks surgical candidate.  Follow further general surgery recommendations.  Oral vancomycin was changed to vancomycin enema by general surgery on 12/30/2020.  Continue IV Flagyl. -N.p.o. for now.  Paroxysmal A. fib with RVR -Not on anticoagulation as an outpatient.  -Rate better controlled currently.  Cardiology signed off on 01/01/2021.  Continue oral metoprolol if able to tolerate.  Use as needed IV metoprolol. -Echo as below shows EF of 50 to 55%  Delirium/confusion -Off restraints since yesterday.  Continue as needed Haldol and Ativan for severe agitation -vitamin A41, folic acid and TSH levels normal  CAD status post PCI -Currently stable.  No chest pain.  Continue aspirin, metoprolol, simvastatin.  Outpatient follow-up with cardiology  COPD Acute hypoxic respiratory failure -Improved.  Intermittently requiring 2 L oxygen by nasal  cannula. -DC Solu-Medrol.  Continue nebs and start Pulmicort.  Hypokalemia -  Improving.  Still on supplemental potassium with IV fluids.  Hypernatremia -Resolved.  Continue gentle hydration with D5.  Chronic kidney disease stage IIIa -Stable.  Monitor  Hypertension: -Blood pressure intermittently on the high side.  Continue increased dose of Toprol  Hyperlipidemia: -Continue simvastatin if able to take orally.  BPH: -Continue tamsulosin able to take orally.  Generalized deconditioning -will need PT eval once more stable. -Palliative care following.  Patient has worsening colonic distention with possible toxic megacolon on top of chronic cholecystitis and I doubt that patient is any kind of surgical candidate; with intermittent delirium and agitation, his overall prognosis looks very poor at this time.   -If condition does not improve, recommend hospice/comfort measures  DVT prophylaxis: Subcutaneous heparin Code Status: DNR Family Communication: Spoke to wife and daughter at bedside on 12/30/2020 Disposition Plan: Status is: Inpatient  Remains inpatient appropriate because:Inpatient level of care appropriate due to severity of illness   Dispo: The patient is from: Home              Anticipated d/c is to: Home              Patient currently is not medically stable to d/c.   Difficult to place patient No  Consultants: General surgery/cardiology/palliative care  Procedures: Echo  Antimicrobials:  Anti-infectives (From admission, onward)   Start     Dose/Rate Route Frequency Ordered Stop   01/02/21 1330  vancomycin (VANCOCIN) 500 mg in sodium chloride irrigation 0.9 % 100 mL ENEMA        500 mg Rectal Every 6 hours 01/02/21 1234 01/16/21 1159   12/30/20 1200  vancomycin (VANCOCIN) 50 mg/mL oral solution 500 mg  Status:  Discontinued        500 mg Oral Every 6 hours 12/30/20 0956 01/02/21 1234   12/30/20 1200  metroNIDAZOLE (FLAGYL) IVPB 500 mg        500 mg 100 mL/hr  over 60 Minutes Intravenous Every 8 hours 12/30/20 0956     12/29/20 0600  vancomycin (VANCOREADY) IVPB 1250 mg/250 mL  Status:  Discontinued        1,250 mg 166.7 mL/hr over 90 Minutes Intravenous Every 36 hours 12/27/20 2315 12/28/20 1007   12/28/20 2000  vancomycin (VANCOCIN) 50 mg/mL oral solution 125 mg  Status:  Discontinued        125 mg Oral Every 6 hours 12/28/20 1801 12/30/20 0956   12/28/20 0830  clindamycin (CLEOCIN) IVPB 900 mg  Status:  Discontinued       "And" Linked Group Details   900 mg 100 mL/hr over 30 Minutes Intravenous On call to O.R. 12/28/20 6440 12/29/20 0559   12/28/20 0830  gentamicin (GARAMYCIN) 360 mg in dextrose 5 % 100 mL IVPB  Status:  Discontinued       "And" Linked Group Details   5 mg/kg  71.2 kg 109 mL/hr over 60 Minutes Intravenous On call to O.R. 12/28/20 3474 12/29/20 0559   12/28/20 0600  ceFEPIme (MAXIPIME) 2 g in sodium chloride 0.9 % 100 mL IVPB  Status:  Discontinued        2 g 200 mL/hr over 30 Minutes Intravenous Every 12 hours 12/27/20 2315 12/29/20 0955   12/28/20 0200  metroNIDAZOLE (FLAGYL) IVPB 500 mg  Status:  Discontinued        500 mg 100 mL/hr over 60 Minutes Intravenous Every 8 hours 12/27/20 2202 12/29/20 0955   12/27/20 1830  aztreonam (AZACTAM) 2 g in sodium chloride 0.9 %  100 mL IVPB  Status:  Discontinued        2 g 200 mL/hr over 30 Minutes Intravenous  Once 12/27/20 1820 12/27/20 1823   12/27/20 1830  metroNIDAZOLE (FLAGYL) IVPB 500 mg        500 mg 100 mL/hr over 60 Minutes Intravenous  Once 12/27/20 1820 12/27/20 1935   12/27/20 1830  vancomycin (VANCOCIN) IVPB 1000 mg/200 mL premix        1,000 mg 200 mL/hr over 60 Minutes Intravenous  Once 12/27/20 1820 12/27/20 1934   12/27/20 1830  ceFEPIme (MAXIPIME) 2 g in sodium chloride 0.9 % 100 mL IVPB        2 g 200 mL/hr over 30 Minutes Intravenous  Once 12/27/20 1824 12/27/20 1903       Subjective: Patient seen and examined at bedside.  No overnight fever, vomiting,  worsening agitation reported.   Objective: Vitals:   01/04/21 0400 01/04/21 0500 01/04/21 0517 01/04/21 0600  BP: (!) 145/113  (!) 145/113 (!) 139/59  Pulse: 82 79 87 85  Resp: (!) 22 (!) 24  (!) 25  Temp:      TempSrc:      SpO2: 97%     Weight:      Height:        Intake/Output Summary (Last 24 hours) at 01/04/2021 0727 Last data filed at 01/04/2021 0655 Gross per 24 hour  Intake 1787.42 ml  Output 925 ml  Net 862.42 ml   Filed Weights   01/02/21 0308 01/03/21 0438 01/04/21 0349  Weight: 71.3 kg 72.2 kg 74.3 kg    Examination:  General exam: Intermittently requiring 2 L oxygen by nasal cannula.  No distress currently.  Looks chronically ill and deconditioned. Extremely ill looking elderly gentleman lying in bed  respiratory system: Decreased breath sounds at bases bilaterally with scattered crackles and tachypneic  cardiovascular system: S1-S2 heard; currently rate controlled  gastrointestinal system: Abdomen is very distended, soft and mildly tender diffusely.  Bowel sounds are sluggish extremities: No cyanosis; mild lower extremity edema. Central nervous system: Awake, answers some basic questions but does not participate in conversation much.  No focal neurological deficit.  Moves extremities. skin: No obvious ecchymosis/lesions psychiatry: Cannot assess because of mental status   Data Reviewed: I have personally reviewed following labs and imaging studies  CBC: Recent Labs  Lab 12/31/20 0204 01/01/21 0237 01/02/21 1103 01/03/21 0240 01/04/21 0242  WBC 12.0* 14.1* 8.6 8.7 10.8*  NEUTROABS 10.3* 12.1* 7.3 7.3 8.8*  HGB 10.1* 10.6* 10.6* 10.1* 10.5*  HCT 33.0* 34.8* 33.5* 32.0* 33.2*  MCV 91.7 91.8 89.6 89.6 88.5  PLT 232 265 265 245 267   Basic Metabolic Panel: Recent Labs  Lab 12/31/20 0204 01/01/21 0237 01/02/21 1103 01/02/21 1906 01/03/21 0240 01/03/21 1909 01/04/21 0242  NA 148* 149* 141  --  140  --  139  K 3.8 3.2* 2.5* 2.8* 2.7* 3.6 3.5  CL  117* 118* 115*  --  115*  --  114*  CO2 15* 24 22  --  17*  --  19*  GLUCOSE 125* 184* 246*  --  219*  --  187*  BUN 51* 60* 42*  --  40*  --  36*  CREATININE 1.47* 1.35* 1.02  --  1.07  --  1.07  CALCIUM 9.0 9.1 8.4*  --  8.3*  --  8.5*  MG 2.4 2.4 2.0 2.1 2.1  --  2.2   GFR: Estimated Creatinine Clearance: 45.5 mL/min (  by C-G formula based on SCr of 1.07 mg/dL). Liver Function Tests: Recent Labs  Lab 12/31/20 0204 01/01/21 0237 01/02/21 1103 01/03/21 0240 01/04/21 0242  AST 86* 93* 45* 35 27  ALT 38 55* 54* 48* 41  ALKPHOS 62 70 55 50 47  BILITOT 0.9 0.7 0.6 0.5 0.8  PROT 6.6 6.9 6.1* 5.3* 5.4*  ALBUMIN 3.2* 3.4* 3.1* 2.9* 2.9*   No results for input(s): LIPASE, AMYLASE in the last 168 hours. Recent Labs  Lab 12/30/20 0751  AMMONIA 14   Coagulation Profile: No results for input(s): INR, PROTIME in the last 168 hours. Cardiac Enzymes: No results for input(s): CKTOTAL, CKMB, CKMBINDEX, TROPONINI in the last 168 hours. BNP (last 3 results) No results for input(s): PROBNP in the last 8760 hours. HbA1C: No results for input(s): HGBA1C in the last 72 hours. CBG: No results for input(s): GLUCAP in the last 168 hours. Lipid Profile: No results for input(s): CHOL, HDL, LDLCALC, TRIG, CHOLHDL, LDLDIRECT in the last 72 hours. Thyroid Function Tests: No results for input(s): TSH, T4TOTAL, FREET4, T3FREE, THYROIDAB in the last 72 hours. Anemia Panel: No results for input(s): VITAMINB12, FOLATE, FERRITIN, TIBC, IRON, RETICCTPCT in the last 72 hours. Sepsis Labs: Recent Labs  Lab 01/03/21 1249 01/04/21 0242  LATICACIDVEN 1.4 1.2    Recent Results (from the past 240 hour(s))  Blood Culture (routine x 2)     Status: None   Collection Time: 12/27/20  5:52 PM   Specimen: BLOOD  Result Value Ref Range Status   Specimen Description   Final    BLOOD SITE NOT SPECIFIED Performed at [facility], [address], [place], [place] [address]    Special Requests   Final     BOTTLES DRAWN AEROBIC AND ANAEROBIC Blood Culture adequate volume Performed at [facility] [address] [place], [place] [address]    Culture   Final    NO GROWTH 5 DAYS Performed at [facility], [place] [address] [place], [place] [address]    Report Status 01/01/2021 FINAL  Final  Blood Culture (routine x 2)     Status: None   Collection Time: 12/27/20  6:19 PM   Specimen: BLOOD  Result Value Ref Range Status   Specimen Description   Final    BLOOD LEFT ANTECUBITAL Performed at [facility] [address], [place], [place] [address]    Special Requests   Final    BOTTLES DRAWN AEROBIC AND ANAEROBIC Blood Culture results may not be optimal due to an inadequate volume of blood received in culture bottles Performed at [facility] [address], [place], [place] [address]    Culture   Final    NO GROWTH 5 DAYS Performed at [facility], [place] [address], [place],  [address]    Report Status 01/01/2021 FINAL  Final  Resp Panel by RT-PCR (Flu A&B, Covid) Nasopharyngeal Swab     Status: None   Collection Time: 12/27/20  6:21 PM   Specimen: Nasopharyngeal Swab; Nasopharyngeal(NP) swabs in vial transport medium  Result Value Ref Range Status   SARS Coronavirus 2 by RT PCR NEGATIVE NEGATIVE Final    Comment: (NOTE) SARS-CoV-2 target nucleic acids are NOT DETECTED.  The SARS-CoV-2 RNA is generally detectable in upper respiratory specimens during the acute phase of infection. The lowest concentration of SARS-CoV-2 viral copies this assay can detect is 138 copies/mL. A negative result does not preclude SARS-Cov-2 infection  and should not be used as the sole basis for treatment or other patient management decisions. A negative result may occur with  improper specimen collection/handling, submission of specimen other than nasopharyngeal swab, presence of viral mutation(s) within the areas targeted by this  assay, and inadequate number of viral copies(<138 copies/mL). A negative result must be combined with clinical observations, patient history, and epidemiological information. The expected result is Negative.  Fact Sheet for Patients:  EntrepreneurPulse.com.au  Fact Sheet for Healthcare Providers:  IncredibleEmployment.be  This test is no t yet approved or cleared by the [place] FDA and  has been authorized for detection and/or diagnosis of SARS-CoV-2 by FDA under an Emergency Use Authorization (EUA). This EUA will remain  in effect (meaning this test can be used) for the duration of the COVID-19 declaration under Section 564(b)(1) of the Act, 21 U.S.C.section 360bbb-3(b)(1), unless the authorization is terminated  or revoked sooner.       Influenza A by PCR NEGATIVE NEGATIVE Final   Influenza B by PCR NEGATIVE NEGATIVE Final    Comment: (NOTE) The Xpert Xpress SARS-CoV-2/FLU/RSV plus assay is intended as an aid in the diagnosis of influenza from Nasopharyngeal swab specimens and should not be used as a sole basis for treatment. Nasal washings and aspirates are unacceptable for Xpert Xpress SARS-CoV-2/FLU/RSV testing.  Fact Sheet for Patients: EntrepreneurPulse.com.au  Fact Sheet for Healthcare Providers: IncredibleEmployment.be  This test is not yet approved or cleared by the [place] FDA and has been authorized for detection and/or diagnosis of SARS-CoV-2 by FDA under an Emergency Use Authorization (EUA). This EUA will remain in effect (meaning this test can be used) for the duration of the COVID-19 declaration under Section 564(b)(1) of the Act, 21 U.S.C. section 360bbb-3(b)(1), unless the authorization is terminated or revoked.  Performed at [facility], [place] [address], [place], [place] [address]   Urine culture     Status: Abnormal   Collection Time: 12/27/20 10:14  PM   Specimen: In/Out Cath Urine  Result Value Ref Range Status   Specimen Description   Final    IN/OUT CATH URINE Performed at [facility] [address], [place], [place] [address]    Special Requests   Final    NONE Performed at [facility], [place] [address] [place],  [address]    Culture MULTIPLE SPECIES PRESENT, SUGGEST RECOLLECTION (A)  Final   Report Status 12/29/2020 FINAL  Final  MRSA PCR Screening     Status: Abnormal   Collection Time: 12/28/20  8:46 AM   Specimen: Nasal Mucosa; Nasopharyngeal  Result Value Ref Range Status   MRSA by PCR POSITIVE (A) NEGATIVE Final    Comment:        The GeneXpert MRSA Assay (FDA approved for NASAL specimens only), is one component of a comprehensive MRSA colonization surveillance program. It is not intended to diagnose MRSA infection nor to guide or monitor treatment for MRSA infections. RESULT CALLED TO, READ BACK BY AND VERIFIED WITH: Leida Lauth RN AT 1112 12/28/20 MULLINS,T Performed at [facility], [place] [address], [place], [place] [address]   C Difficile Quick Screen w PCR reflex     Status: Abnormal   Collection Time: 12/28/20 11:43 AM   Specimen: STOOL  Result Value Ref Range Status   C Diff antigen POSITIVE (A) NEGATIVE Final   C Diff toxin POSITIVE (A) NEGATIVE Final   C Diff interpretation Toxin producing C. difficile detected.  Final  Comment: CRITICAL RESULT CALLED TO, READ BACK BY AND VERIFIED WITH: L.CAUDLE, RN AT 1757 ON 05.18.22 BY N.THOMPSON Performed at [facility] [address], [place], [place] [address]   Gastrointestinal Panel by PCR , Stool     Status: None   Collection Time: 12/28/20 11:43 AM   Specimen: STOOL  Result Value Ref Range Status   Campylobacter species NOT DETECTED NOT DETECTED Final   Plesimonas shigelloides NOT DETECTED NOT DETECTED Final   Salmonella species NOT DETECTED NOT DETECTED Final    Yersinia enterocolitica NOT DETECTED NOT DETECTED Final   Vibrio species NOT DETECTED NOT DETECTED Final   Vibrio cholerae NOT DETECTED NOT DETECTED Final   Enteroaggregative E coli (EAEC) NOT DETECTED NOT DETECTED Final   Enteropathogenic E coli (EPEC) NOT DETECTED NOT DETECTED Final   Enterotoxigenic E coli (ETEC) NOT DETECTED NOT DETECTED Final   Shiga like toxin producing E coli (STEC) NOT DETECTED NOT DETECTED Final   Shigella/Enteroinvasive E coli (EIEC) NOT DETECTED NOT DETECTED Final   Cryptosporidium NOT DETECTED NOT DETECTED Final   Cyclospora cayetanensis NOT DETECTED NOT DETECTED Final   Entamoeba histolytica NOT DETECTED NOT DETECTED Final   Giardia lamblia NOT DETECTED NOT DETECTED Final   Adenovirus F40/41 NOT DETECTED NOT DETECTED Final   Astrovirus NOT DETECTED NOT DETECTED Final   Norovirus GI/GII NOT DETECTED NOT DETECTED Final   Rotavirus A NOT DETECTED NOT DETECTED Final   Sapovirus (I, II, IV, and V) NOT DETECTED NOT DETECTED Final    Comment: Performed at [facility], [address] [place], [place] [address]         Radiology Studies: CT ABDOMEN PELVIS WO CONTRAST  Result Date: 01/02/2021 CLINICAL DATA:  Abdominal distension, bowel obstruction suspected, infectious gastroenteritis or colitis, possible toxic megacolon, C diff EXAM: CT ABDOMEN AND PELVIS WITHOUT CONTRAST TECHNIQUE: Multidetector CT imaging of the abdomen and pelvis was performed following the standard protocol without IV contrast. COMPARISON:  12/27/2020 FINDINGS: Lower chest: There is a new small right pleural effusion and associated atelectasis or consolidation. There is incompletely imaged heterogeneous airspace opacity in the posterior right upper lobe (series 7, image 1). Coronary artery calcifications. Hepatobiliary: No solid liver abnormality is seen. No gallstones, gallbladder wall thickening, or biliary dilatation. Pancreas: Unremarkable. No pancreatic ductal dilatation or  surrounding inflammatory changes. Spleen: Normal in size without significant abnormality. Adrenals/Urinary Tract: Adrenal glands are unremarkable. Fluid attenuation cyst of the superior pole of the left kidney. Kidneys are otherwise normal, without renal calculi, solid lesion, or hydronephrosis. Bladder is unremarkable. Stomach/Bowel: Stomach is within normal limits. Appendix appears normal. The colon is diffusely distended and fluid-filled to the rectum, largest loops measuring 10.3 cm in caliber. Vascular/Lymphatic: Aortic atherosclerosis. No enlarged abdominal or pelvic lymph nodes. Reproductive: Prostatomegaly. Other: Fat containing left inguinal hernia. Trace ascites throughout the abdomen and pelvis. Anasarca. Musculoskeletal: No acute or significant osseous findings. Chronic bilateral pars defects of L5. IMPRESSION: 1. The colon is diffusely distended and fluid-filled to the rectum, largest loops measuring 10.3 cm in caliber. In the setting of known C diff colitis, this is concerning for toxic megacolon. 2. There is a new small right pleural effusion and associated atelectasis or consolidation. There is incompletely imaged heterogeneous airspace opacity in the posterior right upper lobe. Findings are concerning for infection or aspiration. Consider dedicated imaging of the chest to further evaluate. 3. Trace ascites throughout the abdomen and pelvis. Anasarca. 4. Prostatomegaly. 5. Coronary artery disease. Aortic Atherosclerosis (ICD10-I70.0). Electronically Signed  By: Eddie Candle M.D.   On: 01/02/2021 15:16   DG ABD ACUTE 2+V W 1V CHEST  Result Date: 01/02/2021 CLINICAL DATA:  87 year old male with distended abdomen. EXAM: DG ABDOMEN ACUTE WITH 1 VIEW CHEST COMPARISON:  Abdominal series 12/30/2020 and earlier, including CT Abdomen and Pelvis 12/27/2020. FINDINGS: Semi upright portable view of the chest at 0816 hours. Mildly lower lung volumes. Stable cardiac size and mediastinal contours. Allowing for  portable technique the lungs are clear. No pneumothorax or pneumoperitoneum. No acute osseous abnormality identified. Upright and supine views of the abdomen and pelvis. Increasing gaseous distension of redundant colon, largely new from the CT 12/27/2020. Increased rectal gas now. Gas-filled but nondilated small bowel loops. No pneumoperitoneum. No acute osseous abnormality identified. IMPRESSION: 1. New and increasing gaseous distension of the colon since the CT 12/27/2020. Appearance favors progressive large bowel ileus. Developing toxic megacolon not excluded. 2. No free air. 3. No acute cardiopulmonary abnormality. Electronically Signed   By: Genevie Ann M.D.   On: 01/02/2021 09:01   DG Abd Portable 1V  Result Date: 01/04/2021 CLINICAL DATA:  Toxic megacolon EXAM: PORTABLE ABDOMEN - 1 VIEW COMPARISON:  01/02/2021 FINDINGS: There is marked gaseous distension of the colon again noted. Gaseous distension of the proximal transverse colon and cecum has progressed since prior examination measuring roughly 14 cm in greatest caliber of the a colon within the proximal transverse segment. No pneumatosis or gross free intraperitoneal gas. Visualized lung bases are clear. IMPRESSION: Progressive gaseous distension of the colon with maximal caliber of approximately 14 cm within the proximal transverse segment. No free air. Electronically Signed   By: Fidela Salisbury MD   On: 01/04/2021 05:27        Scheduled Meds: . aspirin EC  81 mg Oral Daily  . budesonide (PULMICORT) nebulizer solution  0.5 mg Nebulization BID  . Chlorhexidine Gluconate Cloth  6 each Topical Daily  . heparin  5,000 Units Subcutaneous Q8H  . ipratropium-albuterol  3 mL Nebulization BID  . mouth rinse  15 mL Mouth Rinse BID  . methylPREDNISolone (SOLU-MEDROL) injection  40 mg Intravenous Q24H  . metoprolol tartrate  25 mg Oral Q6H  . pantoprazole  40 mg Oral Daily  . simvastatin  40 mg Oral QPM  . sodium chloride flush  3 mL Intravenous  Q12H  . tamsulosin  0.4 mg Oral Daily  . vancomycin (VANCOCIN) rectal ENEMA  500 mg Rectal Q6H   Continuous Infusions: . dextrose 5 % with kcl 75 mL/hr at 01/03/21 1815  . metronidazole Stopped (01/04/21 0444)          Aline August, MD [facility] 01/04/2021, 7:27 AM

## 2021-01-04 NOTE — Progress Notes (Addendum)
Progress Note  6 Days Post-Op  Subjective: CC: afebrile. Alert and oriented to self, place, situation - cognitive status much improved from yesterday. He states his abdomen just feels "sore" and he has had a little nausea. Still having BMs. I discuss with him our concerns for worsening toxic megacolon and increasing risk for perforation and that one option is surgery with ostomy creation if no improvement with antibiotics. He does not fully comprehend this as he states "I'll think about it when I get out of here in a few days." I ask if he would be okay with having an ostomy and he says he doesn't think so. He wants to discuss it with his family.  Objective: Vital signs in last 24 hours: Temp:  [96.4 F (35.8 C)-98.3 F (36.8 C)] 97.4 F (36.3 C) (05/25 0800) Pulse Rate:  [71-94] 93 (05/25 0800) Resp:  [20-28] 26 (05/25 0800) BP: (124-152)/(59-113) 152/70 (05/25 0800) SpO2:  [96 %-100 %] 100 % (05/25 0857) Weight:  [74.3 kg] 74.3 kg (05/25 0349) Last BM Date: 01/03/21  Intake/Output from previous day: 05/24 0701 - 05/25 0700 In: 1907.9 [I.V.:816.1; IV Piggyback:1091.9] Out: 925 [Urine:925] Intake/Output this shift: No intake/output data recorded.  PE: General: frail, ill appearing, male who is sitting up in bed in NAD. No restraints HEENT: head is normocephalic, atraumatic.  Edentulous Heart: regular, rate, and rhythm.  Palpable radial and pedal pulses bilaterally Lungs: Respiratory effort nonlabored on room air Abd: very distended. +BS. Diffuse and very mild tenderness to palpation without guarding MS: all 4 extremities are symmetrical with no cyanosis, clubbing, or edema. Skin: warm and dry  Psych/Neuro: alert and oriented x3 though confused. Pleasant and calm with appropriate affect   Lab Results:  Recent Labs    01/03/21 0240 01/04/21 0242  WBC 8.7 10.8*  HGB 10.1* 10.5*  HCT 32.0* 33.2*  PLT 245 248   BMET Recent Labs    01/03/21 0240 01/03/21 1909  01/04/21 0242  NA 140  --  139  K 2.7* 3.6 3.5  CL 115*  --  114*  CO2 17*  --  19*  GLUCOSE 219*  --  187*  BUN 40*  --  36*  CREATININE 1.07  --  1.07  CALCIUM 8.3*  --  8.5*   PT/INR No results for input(s): LABPROT, INR in the last 72 hours. CMP     Component Value Date/Time   NA 139 01/04/2021 0242   NA 135 (L) 04/29/2013 1543   K 3.5 01/04/2021 0242   K 3.3 (L) 04/29/2013 1543   CL 114 (H) 01/04/2021 0242   CL 102 04/29/2013 1543   CO2 19 (L) 01/04/2021 0242   CO2 27 04/29/2013 1543   GLUCOSE 187 (H) 01/04/2021 0242   GLUCOSE 120 (H) 04/29/2013 1543   BUN 36 (H) 01/04/2021 0242   BUN 17 04/29/2013 1543   CREATININE 1.07 01/04/2021 0242   CREATININE 2.47 (H) 09/09/2020 1343   CALCIUM 8.5 (L) 01/04/2021 0242   CALCIUM 9.8 04/29/2013 1543   PROT 5.4 (L) 01/04/2021 0242   PROT 7.9 04/29/2013 1543   ALBUMIN 2.9 (L) 01/04/2021 0242   ALBUMIN 3.8 04/29/2013 1543   AST 27 01/04/2021 0242   AST 30 04/29/2013 1543   ALT 41 01/04/2021 0242   ALT 24 04/29/2013 1543   ALKPHOS 47 01/04/2021 0242   ALKPHOS 79 04/29/2013 1543   BILITOT 0.8 01/04/2021 0242   BILITOT 0.4 04/29/2013 1543   GFRNONAA >60 01/04/2021 0242  GFRNONAA >60 04/29/2013 1543   GFRAA 42 (L) 05/04/2017 1945   GFRAA >60 04/29/2013 1543   Lipase     Component Value Date/Time   LIPASE 22 12/27/2020 1752   LIPASE 154 04/29/2013 1543       Studies/Results: CT ABDOMEN PELVIS WO CONTRAST  Result Date: 01/02/2021 CLINICAL DATA:  Abdominal distension, bowel obstruction suspected, infectious gastroenteritis or colitis, possible toxic megacolon, C diff EXAM: CT ABDOMEN AND PELVIS WITHOUT CONTRAST TECHNIQUE: Multidetector CT imaging of the abdomen and pelvis was performed following the standard protocol without IV contrast. COMPARISON:  12/27/2020 FINDINGS: Lower chest: There is a new small right pleural effusion and associated atelectasis or consolidation. There is incompletely imaged heterogeneous airspace  opacity in the posterior right upper lobe (series 7, image 1). Coronary artery calcifications. Hepatobiliary: No solid liver abnormality is seen. No gallstones, gallbladder wall thickening, or biliary dilatation. Pancreas: Unremarkable. No pancreatic ductal dilatation or surrounding inflammatory changes. Spleen: Normal in size without significant abnormality. Adrenals/Urinary Tract: Adrenal glands are unremarkable. Fluid attenuation cyst of the superior pole of the left kidney. Kidneys are otherwise normal, without renal calculi, solid lesion, or hydronephrosis. Bladder is unremarkable. Stomach/Bowel: Stomach is within normal limits. Appendix appears normal. The colon is diffusely distended and fluid-filled to the rectum, largest loops measuring 10.3 cm in caliber. Vascular/Lymphatic: Aortic atherosclerosis. No enlarged abdominal or pelvic lymph nodes. Reproductive: Prostatomegaly. Other: Fat containing left inguinal hernia. Trace ascites throughout the abdomen and pelvis. Anasarca. Musculoskeletal: No acute or significant osseous findings. Chronic bilateral pars defects of L5. IMPRESSION: 1. The colon is diffusely distended and fluid-filled to the rectum, largest loops measuring 10.3 cm in caliber. In the setting of known C diff colitis, this is concerning for toxic megacolon. 2. There is a new small right pleural effusion and associated atelectasis or consolidation. There is incompletely imaged heterogeneous airspace opacity in the posterior right upper lobe. Findings are concerning for infection or aspiration. Consider dedicated imaging of the chest to further evaluate. 3. Trace ascites throughout the abdomen and pelvis. Anasarca. 4. Prostatomegaly. 5. Coronary artery disease. Aortic Atherosclerosis (ICD10-I70.0). Electronically Signed   By: Eddie Candle M.D.   On: 01/02/2021 15:16   DG Abd Portable 1V  Result Date: 01/04/2021 CLINICAL DATA:  Toxic megacolon EXAM: PORTABLE ABDOMEN - 1 VIEW COMPARISON:   01/02/2021 FINDINGS: There is marked gaseous distension of the colon again noted. Gaseous distension of the proximal transverse colon and cecum has progressed since prior examination measuring roughly 14 cm in greatest caliber of the a colon within the proximal transverse segment. No pneumatosis or gross free intraperitoneal gas. Visualized lung bases are clear. IMPRESSION: Progressive gaseous distension of the colon with maximal caliber of approximately 14 cm within the proximal transverse segment. No free air. Electronically Signed   By: Fidela Salisbury MD   On: 01/04/2021 05:27    Anti-infectives: Anti-infectives (From admission, onward)   Start     Dose/Rate Route Frequency Ordered Stop   01/02/21 1330  vancomycin (VANCOCIN) 500 mg in sodium chloride irrigation 0.9 % 100 mL ENEMA        500 mg Rectal Every 6 hours 01/02/21 1234 01/16/21 1159   12/30/20 1200  vancomycin (VANCOCIN) 50 mg/mL oral solution 500 mg  Status:  Discontinued        500 mg Oral Every 6 hours 12/30/20 0956 01/02/21 1234   12/30/20 1200  metroNIDAZOLE (FLAGYL) IVPB 500 mg        500 mg 100 mL/hr over 60 Minutes  Intravenous Every 8 hours 12/30/20 0956     12/29/20 0600  vancomycin (VANCOREADY) IVPB 1250 mg/250 mL  Status:  Discontinued        1,250 mg 166.7 mL/hr over 90 Minutes Intravenous Every 36 hours 12/27/20 2315 12/28/20 1007   12/28/20 2000  vancomycin (VANCOCIN) 50 mg/mL oral solution 125 mg  Status:  Discontinued        125 mg Oral Every 6 hours 12/28/20 1801 12/30/20 0956   12/28/20 0830  clindamycin (CLEOCIN) IVPB 900 mg  Status:  Discontinued       "And" Linked Group Details   900 mg 100 mL/hr over 30 Minutes Intravenous On call to O.R. 12/28/20 1607 12/29/20 0559   12/28/20 0830  gentamicin (GARAMYCIN) 360 mg in dextrose 5 % 100 mL IVPB  Status:  Discontinued       "And" Linked Group Details   5 mg/kg  71.2 kg 109 mL/hr over 60 Minutes Intravenous On call to O.R. 12/28/20 3710 12/29/20 0559   12/28/20  0600  ceFEPIme (MAXIPIME) 2 g in sodium chloride 0.9 % 100 mL IVPB  Status:  Discontinued        2 g 200 mL/hr over 30 Minutes Intravenous Every 12 hours 12/27/20 2315 12/29/20 0955   12/28/20 0200  metroNIDAZOLE (FLAGYL) IVPB 500 mg  Status:  Discontinued        500 mg 100 mL/hr over 60 Minutes Intravenous Every 8 hours 12/27/20 2202 12/29/20 0955   12/27/20 1830  aztreonam (AZACTAM) 2 g in sodium chloride 0.9 % 100 mL IVPB  Status:  Discontinued        2 g 200 mL/hr over 30 Minutes Intravenous  Once 12/27/20 1820 12/27/20 1823   12/27/20 1830  metroNIDAZOLE (FLAGYL) IVPB 500 mg        500 mg 100 mL/hr over 60 Minutes Intravenous  Once 12/27/20 1820 12/27/20 1935   12/27/20 1830  vancomycin (VANCOCIN) IVPB 1000 mg/200 mL premix        1,000 mg 200 mL/hr over 60 Minutes Intravenous  Once 12/27/20 1820 12/27/20 1934   12/27/20 1830  ceFEPIme (MAXIPIME) 2 g in sodium chloride 0.9 % 100 mL IVPB        2 g 200 mL/hr over 30 Minutes Intravenous  Once 12/27/20 1824 12/27/20 1903       Assessment/Plan CAD status post stenting to RCA in 1997 COPD CKD stage III- Cr 1.34  Hypertension Hyperlipidemia BPH  Cholelithiasis Chronic cholecystitis- follow up with Dr. Redmond Pulling as outpatient as C diff source of infection this admit. - postpone lap chole until outpatient  C. Diff colitis with concern for toxic megacolon - NPO - CT 5/23 shows colon diffusely distended concerning for toxic megacolon and he is diffusely tender on exam - abd xray 5/25 with progressive colonic distension - 14cm in greatest caliber. No pneumatosis or gross free intraperitoneal gas - vancomycin enemas started 5/23  - discussed risk vs benefits of surgery vs continued medical management in this DNR patient with family yesterday and patient today. I have attempted to call patient's wife x3 on both numbers without answer - VM not set up on one and no VM option on the other. Hopeful for further discussion with [place] and  palliative today.  - may need to consider TPN soon if nonoperative management is chosen and patient/family would still want continued medical management  FEN: NPO, IVF VTE: SQ heparin - would not start heparin gtt yet ID: vanc 5/17; Cefepime/flagyl 5/17>5/19, gent/clinda  5/18; PO VANC 5/19>5/23,  flagyl 5/17 >>, Vanc enemas 5/23 >>     LOS: 8 days    Winferd Humphrey, [facility] Surgery 01/04/2021, 9:07 AM Please see Amion for pager number during day hours 7:00am-4:30pm

## 2021-01-04 NOTE — Progress Notes (Signed)
Daily Progress Note   Patient Name: Leon Estes       Date: 01/04/2021 DOB: 09-04-1930  Age: 87 y.o. MRN#: [id number] Attending Physician: Aline August, MD Primary Care Physician: Venia Carbon, MD Admit Date: 12/27/2020  Reason for Consultation/Follow-up: Establishing goals of care  Subjective: Wife and  daughter are at bedside, patient is resting in bed, has abdominal distention, does not awaken, does not verbalize.     Length of Stay: 8  Current Medications: Scheduled Meds:  . aspirin EC  81 mg Oral Daily  . budesonide (PULMICORT) nebulizer solution  0.5 mg Nebulization BID  . Chlorhexidine Gluconate Cloth  6 each Topical Daily  . heparin  5,000 Units Subcutaneous Q8H  . ipratropium-albuterol  3 mL Nebulization BID  . mouth rinse  15 mL Mouth Rinse BID  . methylPREDNISolone (SOLU-MEDROL) injection  40 mg Intravenous Q24H  . metoprolol tartrate  25 mg Oral Q6H  . pantoprazole  40 mg Oral Daily  . simvastatin  40 mg Oral QPM  . sodium chloride flush  3 mL Intravenous Q12H  . tamsulosin  0.4 mg Oral Daily  . vancomycin (VANCOCIN) rectal ENEMA  500 mg Rectal Q6H    Continuous Infusions: . dextrose 5 % with kcl 75 mL/hr at 01/04/21 0803  . metronidazole 500 mg (01/04/21 1227)    PRN Meds: acetaminophen, albuterol, haloperidol lactate, LORazepam, methocarbamol, metoprolol tartrate, ondansetron (ZOFRAN) IV  Physical Exam         General: does not awaken HEENT: No bruits, no goiter, no JVD Heart: Rate controlled currently. Lungs: shallow breath sounds Abdomen: Soft, globally tender, distended  Ext: No significant edema Skin: Warm and dry   Vital Signs: BP (!) 146/65   Pulse 70   Temp (!) 97.4 F (36.3 C) (Axillary)   Resp 20   Ht 5\' 7"  (1.702 m)   Wt 74.3  kg   SpO2 100%   BMI 25.66 kg/m  SpO2: SpO2: 100 % O2 Device: O2 Device: Room Air O2 Flow Rate: O2 Flow Rate (L/min): 2 L/min  Intake/output summary:   Intake/Output Summary (Last 24 hours) at 01/04/2021 1302 Last data filed at 01/04/2021 0655 Gross per 24 hour  Intake 997.56 ml  Output 925 ml  Net 72.56 ml   LBM: Last BM Date: 01/03/21 Baseline Weight: Weight:  71.2 kg Most recent weight: Weight: 74.3 kg       Palliative Assessment/Data:    Flowsheet Rows   Flowsheet Row Most Recent Value  Intake Tab   Referral Department Hospitalist  Unit at Time of Referral ICU  Palliative Care Primary Diagnosis Sepsis/Infectious Disease  Date Notified 12/30/20  Palliative Care Type New Palliative care  Reason for referral Clarify Goals of Care  Date of Admission 12/27/20  Date first seen by Palliative Care 12/31/20  # of days Palliative referral response time 1 Day(s)  # of days IP prior to Palliative referral 3  Clinical Assessment   Palliative Performance Scale Score 20%  Psychosocial & Spiritual Assessment   Palliative Care Outcomes   Patient/Family meeting held? Yes  Who was at the meeting? Wife, son, daughter  Palliative Care Outcomes Clarified goals of care      Patient Active Problem List   Diagnosis Date Noted  . Palliative care by specialist   . Goals of care, counseling/discussion   . General weakness   . Colitis due to Clostridioides difficile 12/31/2020  . Acute delirium 12/31/2020  . HOH (hard of hearing) 12/28/2020  . Acute on chronic cholecystitis 12/28/2020  . Hypokalemia 12/28/2020  . Severe sepsis ([place]) 12/27/2020  . Arrhythmia 12/06/2020  . Pulmonary nodule 12/06/2020  . Acute cholecystitis 11/26/2020  . Preventative health care 10/12/2020  . Macular degeneration, wet ([place]) 10/12/2020  . Acquired claw toe, left 02/02/2020  . BPH (benign prostatic hyperplasia) 01/13/2020  . Neuropathy 02/18/2018  . COPD (chronic obstructive pulmonary disease)  ([place])   . Chronic renal disease, stage III ([place])   . Benign essential HTN 04/09/2017  . CAD (coronary artery disease) 03/03/2015  . Hyperlipidemia 03/03/2015    Palliative Care Assessment & Plan   Patient Profile: 87 y.o. male  with past medical history of CHF, COPD, choledocholithiasis, CAD status post PCI, COPD, CKD, hypertension, hyperlipidemia, BPH admitted on 12/27/2020 with sepsis.  He was recently admitted to [facility] 4/16 through 4/19 for presumed cholecystitis.  He was being followed by Dr. Redmond Pulling for cholecystectomy later this month.  Initially, there was concern that his presentation was related to cholecystitis again, however, with further work-up it appears that this is actually more from C. difficile.  His hospital course has been further complicated by A. fib, COPD exacerbation, and development of agitation/delirium.  Palliative consulted for goals of care.  Recommendations/Plan:  DNR/DNI  Continue current interventions for now, surgery following, discussed with the wife and daughter about CT scan results being concerning for toxic megacolon, surgical colleagues are also following to discuss goals of care with the patient's family.    Overall, patient's family does not think that he would want a major surgical procedure or that he would want to be inside a skilled nursing facility for rehab.  At the same time, goals are not for comfort measures/hospice just yet.  Some family members are out of town and are arriving tomorrow morning.    Family has requested for a family meeting at 3:30 PM on 01-05-2021 when all 4 of the patient's children can be present along with the patient's wife to further discuss broad goals of care, neck steps in this hospitalization, issues pertaining to artificial nutrition and hydration as well as discussion of appropriate disposition options.  Final recommendations to follow further discussions and outcome of tomorrow's family meeting.     Code Status:     Code Status Orders  (From admission, onward)  Start     Ordered   12/29/20 1002  Do not attempt resuscitation (DNR)  Continuous       Question Answer Comment  In the event of cardiac or respiratory ARREST Do not call a "code blue"   In the event of cardiac or respiratory ARREST Do not perform Intubation, CPR, defibrillation or ACLS   In the event of cardiac or respiratory ARREST Use medication by any route, position, wound care, and other measures to relive pain and suffering. May use oxygen, suction and manual treatment of airway obstruction as needed for comfort.      12/29/20 1001        Code Status History    Date Active Date Inactive Code Status Order ID Comments User Context   12/27/2020 2202 12/29/2020 1001 Full Code [id number]  Lenore Cordia, MD ED   11/26/2020 1947 11/29/2020 1648 Full Code [id number]  Kayleen Memos, DO ED   Advance Care Planning Activity       Prognosis:  Guarded There is high likelihood that this could be a terminal admission. Discharge Planning:  To Be Determined  Care plan was discussed with  wife and daughter present at bedside. Have also been in touch with surgical colleague Ms Anice Paganini PA.  Thank you for allowing the Palliative Medicine Team to assist in the care of this patient.   Total Time 35 Prolonged Time Billed No   Greater than 50%  of this time was spent counseling and coordinating care related to the above assessment and plan.  Loistine Chance, MD  Please contact Palliative Medicine Team phone at [phone] for questions and concerns.

## 2021-01-04 NOTE — Progress Notes (Signed)
Nutrition Follow-up  DOCUMENTATION CODES:   Not applicable  INTERVENTION:  - will monitor for nutrition-related plan.    NUTRITION DIAGNOSIS:   Increased nutrient needs related to acute illness,post-op healing as evidenced by estimated needs. -ongoing  GOAL:   Patient will meet greater than or equal to 90% of their needs -unmet/unable to meet  MONITOR:   Diet advancement,Labs,Weight trends  ASSESSMENT:   87 year old male with medical history of CAD s/p stenting in 1997, COPD, stage 3 CKD, HTN, HLD, and BPH. He was admitted to [facility] 4/16-4/19 for cholecystitis, cystic duct obstruction with plan for lap chole on 5/31. He presented to the ED on 5/17 with fever and generalized weakness. CT abdomen/pelvis showed prostatomegaly.  Diet advanced from NPO to CLD on 5/21 at 1145 and then returned to NPO on 5/23 at 1045. The only documented meal completion during this time was 0% of dinner on 5/21.  Weight has been fairly stable throughout hospitalization. No information documented in the edema section of flow sheet.   Surgery is following and note from earlier this AM states: - patient still having BMs - concern for worsening toxic megacolon and increasing risk for perforation - ongoing discussions about surgery vs medical management  - possible need for TPN initiation in the near future  Palliative Care is following and note from 5/24 states DNR/DNI; guarded prognosis.    Labs reviewed; Cl: 114 mmol/l, BUN: 36 mg/dl, Ca: 8.5 mg/dl. Medications reviewed; 40 mg solu-medrol/day, 40 mg oral protonix/day, 10 mEq IV KCl x12 runs 5/24. IVF; D5-40 mEq IV KCl @ 75 ml/hr (306 kcal/24 hrs).    Diet Order:   Diet Order            Diet NPO time specified  Diet effective now                 EDUCATION NEEDS:   Not appropriate for education at this time  Skin:  Skin Assessment: Reviewed RN Assessment  Last BM:  5/25 (type 7 x2)  Height:   Ht Readings from Last 1 Encounters:   12/27/20 5\' 7"  (1.702 m)    Weight:   Wt Readings from Last 1 Encounters:  01/04/21 74.3 kg     Estimated Nutritional Needs:  Kcal:  1600-1800 kcal Protein:  70-85 grams Fluid:  >/= 1.8 L/day      Jarome Matin, MS, RD, LDN, CNSC Inpatient Clinical Dietitian RD pager # available in AMION  After hours/weekend pager # available in [facility]

## 2021-01-05 ENCOUNTER — Inpatient Hospital Stay: Payer: Self-pay

## 2021-01-05 ENCOUNTER — Inpatient Hospital Stay (HOSPITAL_COMMUNITY): Payer: PPO

## 2021-01-05 DIAGNOSIS — Z515 Encounter for palliative care: Secondary | ICD-10-CM | POA: Diagnosis not present

## 2021-01-05 DIAGNOSIS — K5931 Toxic megacolon: Secondary | ICD-10-CM

## 2021-01-05 DIAGNOSIS — A0472 Enterocolitis due to Clostridium difficile, not specified as recurrent: Secondary | ICD-10-CM | POA: Diagnosis not present

## 2021-01-05 DIAGNOSIS — Z7189 Other specified counseling: Secondary | ICD-10-CM | POA: Diagnosis not present

## 2021-01-05 LAB — COMPREHENSIVE METABOLIC PANEL
ALT: 37 U/L (ref 0–44)
AST: 23 U/L (ref 15–41)
Albumin: 2.6 g/dL — ABNORMAL LOW (ref 3.5–5.0)
Alkaline Phosphatase: 41 U/L (ref 38–126)
Anion gap: 6 (ref 5–15)
BUN: 31 mg/dL — ABNORMAL HIGH (ref 8–23)
CO2: 17 mmol/L — ABNORMAL LOW (ref 22–32)
Calcium: 8.2 mg/dL — ABNORMAL LOW (ref 8.9–10.3)
Chloride: 115 mmol/L — ABNORMAL HIGH (ref 98–111)
Creatinine, Ser: 0.77 mg/dL (ref 0.61–1.24)
GFR, Estimated: >60 mL/min (ref >60)
Glucose, Bld: 150 mg/dL — ABNORMAL HIGH (ref 70–99)
Potassium: 2.9 mmol/L — ABNORMAL LOW (ref 3.5–5.1)
Sodium: 138 mmol/L (ref 135–145)
Total Bilirubin: 0.4 mg/dL (ref 0.3–1.2)
Total Protein: 5 g/dL — ABNORMAL LOW (ref 6.5–8.1)

## 2021-01-05 LAB — CBC WITH DIFFERENTIAL/PLATELET
Abs Immature Granulocytes: 0.3 10*3/uL — ABNORMAL HIGH (ref 0.00–0.07)
Basophils Absolute: 0 10*3/uL (ref 0.0–0.1)
Basophils Relative: 0 %
Eosinophils Absolute: 0 10*3/uL (ref 0.0–0.5)
Eosinophils Relative: 0 %
HCT: 32.5 % — ABNORMAL LOW (ref 39.0–52.0)
Hemoglobin: 10.6 g/dL — ABNORMAL LOW (ref 13.0–17.0)
Immature Granulocytes: 2 %
Lymphocytes Relative: 8 %
Lymphs Abs: 1 10*3/uL (ref 0.7–4.0)
MCH: 28.4 pg (ref 26.0–34.0)
MCHC: 32.6 g/dL (ref 30.0–36.0)
MCV: 87.1 fL (ref 80.0–100.0)
Monocytes Absolute: 1 10*3/uL (ref 0.1–1.0)
Monocytes Relative: 8 %
Neutro Abs: 10.4 10*3/uL — ABNORMAL HIGH (ref 1.7–7.7)
Neutrophils Relative %: 82 %
Platelets: 250 10*3/uL (ref 150–400)
RBC: 3.73 MIL/uL — ABNORMAL LOW (ref 4.22–5.81)
RDW: 14.7 % (ref 11.5–15.5)
WBC: 12.8 10*3/uL — ABNORMAL HIGH (ref 4.0–10.5)
nRBC: 0 % (ref 0.0–0.2)

## 2021-01-05 LAB — MAGNESIUM: Magnesium: 1.9 mg/dL (ref 1.7–2.4)

## 2021-01-05 LAB — LACTIC ACID, PLASMA: Lactic Acid, Venous: 1.1 mmol/L (ref 0.5–1.9)

## 2021-01-05 MED ORDER — VANCOMYCIN 50 MG/ML ORAL SOLUTION
125.0000 mg | Freq: Four times a day (QID) | ORAL | Status: DC
  Administered 2021-01-05: 125 mg via ORAL
  Filled 2021-01-05 (×3): qty 2.5

## 2021-01-05 MED ORDER — LABETALOL HCL 5 MG/ML IV SOLN
20.0000 mg | INTRAVENOUS | Status: DC | PRN
  Administered 2021-01-06 – 2021-01-07 (×2): 20 mg via INTRAVENOUS
  Filled 2021-01-05 (×2): qty 4

## 2021-01-05 MED ORDER — METOPROLOL TARTRATE 5 MG/5ML IV SOLN
2.5000 mg | Freq: Four times a day (QID) | INTRAVENOUS | Status: DC | PRN
  Administered 2021-01-05 – 2021-01-08 (×6): 2.5 mg via INTRAVENOUS
  Filled 2021-01-05 (×6): qty 5

## 2021-01-05 NOTE — TOC Progression Note (Signed)
Transition of Care [facility]) - Progression Note    Patient Details  Name: Leon Estes MRN: [id number] Date of Birth: April 13, 1931  Transition of Care [facility]) CM/SW Contact  Leeroy Cha, RN Phone Number: 01/05/2021, 8:07 AM  Clinical Narrative:    87 y.o.malewith past medical history of CHF, COPD, choledocholithiasis, CAD status post PCI, COPD, CKD, hypertension, hyperlipidemia, BPHadmitted on 5/17/2022with sepsis. He was recently admitted to [facility] 4/16 through 4/19 for presumed cholecystitis. He was being followed by Dr. Redmond Pulling for cholecystectomy later this month.Initially, there was concern that his presentation was related to cholecystitis again, however, with further work-up it appears that this is actually more from C. difficile. His hospital course has been further complicated by A. fib, COPD exacerbation, and development of agitation/delirium.Palliative consulted for goals of care.  Recommendations/Plan:  DNR/DNI  Continue current interventions for now, surgery following, discussed with the wife and daughter about CT scan results being concerning for toxic megacolon, surgical colleagues are also following to discuss goals of care with the patient's family.    Overall, patient's family does not think that he would want a major surgical procedure or that he would want to be inside a skilled nursing facility for rehab.  At the same time, goals are not for comfort measures/hospice just yet.  Some family members are out of town and are arriving tomorrow morning.    Family has requested for a family meeting at 3:30 PM on 01-05-2021 when all 4 of the patient's children can be present along with the patient's wife to further discuss broad goals of care, neck steps in this hospitalization, issues pertaining to artificial nutrition and hydration as well as discussion of appropriate disposition options.  Final recommendations to follow further discussions and outcome of tomorrow's  family meeting.    P{LAN: following for hospice needs and plan to return to home .  Patient has now been made into a DNR.   Expected Discharge Plan: Home/Self Care Barriers to Discharge: Continued Medical Work up  Expected Discharge Plan and Services Expected Discharge Plan: Home/Self Care   Discharge Planning Services: CM Consult   Living arrangements for the past 2 months: Single Family Home                                       Social Determinants of Health (SDOH) Interventions    Readmission Risk Interventions No flowsheet data found.

## 2021-01-05 NOTE — Progress Notes (Signed)
Unable to get a hold of family to get consent for PICC insertion.Will follow up in AM.

## 2021-01-05 NOTE — Progress Notes (Signed)
Daily Progress Note   Patient Name: Leon Estes       Date: 01/05/2021 DOB: Mar 06, 1931  Age: 87 y.o. MRN#: [id number] Attending Physician: Kayleen Memos, DO Primary Care Physician: Venia Carbon, MD Admit Date: 12/27/2020  Reason for Consultation/Follow-up: Establishing goals of care  Subjective: Medical records reviewed. Wife and four adult children are at bedside and patient is alert, more interactive today.   Met along with Dr. Rowe Pavy and Enid Cutter surgery PA-C for scheduled family meeting to discuss diagnosis, prognosis, and treatment options, including surgery vs medical management vs comfort care. Patient's current illness and recent findings were discussed at length. Family understands that his colitis has neither improved nor worsened and he still remains at high risk to decompensate quickly. Addressed family's questions regarding the dosing and route of antibiotics, infection monitoring. Counseled on the need for IV nutrition if continued medical management is desired. Risks and benefits of both medical management and surgical management were discussed. Comfort care was also explained, including discontinuation of aggressive medical interventions or medications not focused on comfort. Family verbalized understanding and appreciation.   Patient's daughter speaks up frequently and the family all agree that at this time they are remaining positive and hopeful for improvement. While they agree patient would likely not desire surgery even if indicated, they are not prepared to completely reject this option. Family are reasonable and recognize that without a definitive decision today, surgery may need to be addressed and decided on quickly if patient declines. They feel that the patient's  remaining time is in God's hands and agree with continuing medical management.  Questions and concerns addressed. PMT will continue to support holistically.       Length of Stay: 9  Current Medications: Scheduled Meds:  . aspirin EC  81 mg Oral Daily  . budesonide (PULMICORT) nebulizer solution  0.5 mg Nebulization BID  . Chlorhexidine Gluconate Cloth  6 each Topical Daily  . heparin  5,000 Units Subcutaneous Q8H  . ipratropium-albuterol  3 mL Nebulization BID  . mouth rinse  15 mL Mouth Rinse BID  . methylPREDNISolone (SOLU-MEDROL) injection  40 mg Intravenous Q24H  . metoprolol tartrate  25 mg Oral Q6H  . pantoprazole  40 mg Oral Daily  . simvastatin  40 mg Oral QPM  . sodium chloride flush  3 mL Intravenous Q12H  . tamsulosin  0.4 mg Oral Daily  . vancomycin (VANCOCIN) rectal ENEMA  500 mg Rectal Q6H    Continuous Infusions: . dextrose 5 % with kcl 75 mL/hr at 01/05/21 1311  . metronidazole Stopped (01/05/21 1246)    PRN Meds: acetaminophen, albuterol, haloperidol lactate, LORazepam, methocarbamol, metoprolol tartrate, ondansetron (ZOFRAN) IV  Physical Exam Vitals and nursing note reviewed.  Constitutional:      Appearance: He is ill-appearing.  Cardiovascular:     Rate and Rhythm: Normal rate. Rhythm irregular.  Pulmonary:     Effort: Pulmonary effort is normal.  Neurological:     Mental Status: He is alert. He is confused.   ROS: unable to to obtain due to altered mental status  Vital Signs: BP (!) 164/78 (BP Location: Right Arm)   Pulse 82   Temp 98.3 F (36.8 C) (Oral)   Resp 20   Ht _0  (1.702 m)   Wt 74.3 kg   SpO2 98%   BMI 25.66 kg/m  SpO2: SpO2: 98 % O2 Device: O2 Device: Room Air O2 Flow Rate: O2 Flow Rate (L/min): 2 L/min  Intake/output summary:   Intake/Output Summary (Last 24 hours) at 01/05/2021 1606 Last data filed at 01/05/2021 0500 Gross per 24 hour  Intake 1333.01 ml  Output --  Net 1333.01 ml   LBM: Last BM Date:  01/04/21 Baseline Weight: Weight: 71.2 kg Most recent weight: Weight: 74.3 kg      Flowsheet Rows   Flowsheet Row Most Recent Value  Intake Tab   Referral Department Hospitalist  Unit at Time of Referral ICU  Palliative Care Primary Diagnosis Sepsis/Infectious Disease  Date Notified 12/30/20  Palliative Care Type New Palliative care  Reason for referral Clarify Goals of Care  Date of Admission 12/27/20  Date first seen by Palliative Care 12/31/20  # of days Palliative referral response time 1 Day(s)  # of days IP prior to Palliative referral 3  Clinical Assessment   Palliative Performance Scale Score 20%  Psychosocial & Spiritual Assessment   Palliative Care Outcomes   Patient/Family meeting held? Yes  Who was at the meeting? Wife, son, daughter  Palliative Care Outcomes Clarified goals of care      Patient Active Problem List   Diagnosis Date Noted  . Palliative care by specialist   . Goals of care, counseling/discussion   . General weakness   . Colitis due to Clostridioides difficile 12/31/2020  . Acute delirium 12/31/2020  . HOH (hard of hearing) 12/28/2020  . Acute on chronic cholecystitis 12/28/2020  . Hypokalemia 12/28/2020  . Severe sepsis ([place]) 12/27/2020  . Arrhythmia 12/06/2020  . Pulmonary nodule 12/06/2020  . Acute cholecystitis 11/26/2020  . Preventative health care 10/12/2020  . Macular degeneration, wet ([place]) 10/12/2020  . Acquired claw toe, left 02/02/2020  . BPH (benign prostatic hyperplasia) 01/13/2020  . Neuropathy 02/18/2018  . COPD (chronic obstructive pulmonary disease) ([place])   . Chronic renal disease, stage III ([place])   . Benign essential HTN 04/09/2017  . CAD (coronary artery disease) 03/03/2015  . Hyperlipidemia 03/03/2015    Palliative Care Assessment & Plan   Patient Profile: 87 y.o. male  with past medical history of CHF, COPD, choledocholithiasis, CAD status post PCI, COPD, CKD, hypertension, hyperlipidemia, BPH admitted on  12/27/2020 with sepsis.  He was recently admitted to [facility] 4/16 through 4/19 for presumed cholecystitis.  He was being followed by Dr. Redmond Pulling for cholecystectomy later this month.  Initially, there was  concern that his presentation was related to cholecystitis again, however, with further work-up it appears that this is actually more from C. difficile.  His hospital course has been further complicated by A. fib, COPD exacerbation, and development of agitation/delirium.  Palliative consulted for goals of care.  Recommendations/Plan:  Continue current interventions for now  Family would likely decline surgery if needed in the future but are not prepared to make a definitive decision at this time  Discussed with surgery, will proceed with IV TPN as pharmacy recommends given family's decision for continued medical management  Psychosocial and emotional support provided  Ongoing discussions and support from PMT  Code Status:  DNR  Code Status Orders  (From admissi  on, onward)         Start     Ordered   12/29/20 1002  Do not attempt resuscitation (DNR)  Continuous       Question Answer Comment  In the event of cardiac or respiratory ARREST Do not call a "code blue"   In the event of cardiac or respiratory ARREST Do not perform Intubation, CPR, defibrillation or ACLS   In the event of cardiac or respiratory ARREST Use medication by any route, position, wound care, and other measures to relive pain and suffering. May use oxygen, suction and manual treatment of airway obstruction as needed for comfort.      12/29/20 1001        Code Status History    Date Active Date Inactive Code Status Order ID Comments User Context   12/27/2020 2202 12/29/2020 1001 Full Code [id number]  Lenore Cordia, MD ED   11/26/2020 1947 11/29/2020 1648 Full Code [id number]  Kayleen Memos, DO ED   Advance Care Planning Activity     Prognosis:  Guarded There is high likelihood that this could be a terminal  admission. Discharge Planning:  To Be Determined  Care plan was discussed with  Dr. Rowe Pavy, Enid Cutter PA-C, patient's wife and patient's four adult children  Total time: 30 minutes Greater than 50% of this time was spent in counseling and coordinating care related to the above assessment and plan.  Dorthy Cooler, PA-C Palliative Medicine Team Team phone # [phone]  Thank you for allowing the Palliative Medicine Team to assist in the care of this patient. Please utilize secure chat with additional questions, if there is no response within 30 minutes please call the above phone number.  Palliative Medicine Team providers are available by phone from 7am to 7pm daily and can be reached through the team cell phone.  Should this patient require assistance outside of these hours, please call the patient's attending physician.

## 2021-01-05 NOTE — Progress Notes (Signed)
Progress Note  7 Days Post-Op  Subjective: CC: afebrile. Alert and oriented to self, place, situation. When asked how he's doing he says "About half." He remembers our discussion about possible surgery yesterday and states he could be okay with it but asks for his wife and wants her input. He denies nausea/emesis and says his abdomen only hurts "some."    Objective: Vital signs in last 24 hours: Temp:  [97.8 F (36.6 C)-98.9 F (37.2 C)] 98 F (36.7 C) (05/26 0800) Pulse Rate:  [67-92] 86 (05/26 1000) Resp:  [14-25] 22 (05/26 1000) BP: (120-173)/(46-106) 152/75 (05/26 1000) SpO2:  [100 %] 100 % (05/26 0800) FiO2 (%):  [96 %] 96 % (05/26 0810) Last BM Date: 01/04/21  Intake/Output from previous day: 05/25 0701 - 05/26 0700 In: 2990.4 [I.V.:2493.7; IV Piggyback:496.6] Out: -  Intake/Output this shift: No intake/output data recorded.  PE: General: frail, ill appearing, male who is sitting up in bed in NAD. No restraints HEENT: head is normocephalic, atraumatic.  Edentulous Heart: regular, rate, and rhythm.  Palpable radial and pedal pulses bilaterally Lungs: Respiratory effort nonlabored on room air Abd: very firmly distended. +BS. Diffuse and very mild tenderness to palpation without guarding MS: all 4 extremities are symmetrical with no cyanosis, clubbing, or edema. Skin: warm and dry  Psych/Neuro: alert and oriented x3. Pleasant and calm with appropriate affect   Lab Results:  Recent Labs    01/04/21 0242 01/05/21 0240  WBC 10.8* 12.8*  HGB 10.5* 10.6*  HCT 33.2* 32.5*  PLT 248 250   BMET Recent Labs    01/04/21 0242 01/05/21 0240  NA 139 138  K 3.5 2.9*  CL 114* 115*  CO2 19* 17*  GLUCOSE 187* 150*  BUN 36* 31*  CREATININE 1.07 0.77  CALCIUM 8.5* 8.2*   PT/INR No results for input(s): LABPROT, INR in the last 72 hours. CMP     Component Value Date/Time   NA 138 01/05/2021 0240   NA 135 (L) 04/29/2013 1543   K 2.9 (L) 01/05/2021 0240   K 3.3  (L) 04/29/2013 1543   CL 115 (H) 01/05/2021 0240   CL 102 04/29/2013 1543   CO2 17 (L) 01/05/2021 0240   CO2 27 04/29/2013 1543   GLUCOSE 150 (H) 01/05/2021 0240   GLUCOSE 120 (H) 04/29/2013 1543   BUN 31 (H) 01/05/2021 0240   BUN 17 04/29/2013 1543   CREATININE 0.77 01/05/2021 0240   CREATININE 2.47 (H) 09/09/2020 1343   CALCIUM 8.2 (L) 01/05/2021 0240   CALCIUM 9.8 04/29/2013 1543   PROT 5.0 (L) 01/05/2021 0240   PROT 7.9 04/29/2013 1543   ALBUMIN 2.6 (L) 01/05/2021 0240   ALBUMIN 3.8 04/29/2013 1543   AST 23 01/05/2021 0240   AST 30 04/29/2013 1543   ALT 37 01/05/2021 0240   ALT 24 04/29/2013 1543   ALKPHOS 41 01/05/2021 0240   ALKPHOS 79 04/29/2013 1543   BILITOT 0.4 01/05/2021 0240   BILITOT 0.4 04/29/2013 1543   GFRNONAA >60 01/05/2021 0240   GFRNONAA >60 04/29/2013 1543   GFRAA 42 (L) 05/04/2017 1945   GFRAA >60 04/29/2013 1543   Lipase     Component Value Date/Time   LIPASE 22 12/27/2020 1752   LIPASE 154 04/29/2013 1543       Studies/Results: DG Abd Portable 1V  Result Date: 01/04/2021 CLINICAL DATA:  Toxic megacolon EXAM: PORTABLE ABDOMEN - 1 VIEW COMPARISON:  01/02/2021 FINDINGS: There is marked gaseous distension of the colon again noted.  Gaseous distension of the proximal transverse colon and cecum has progressed since prior examination measuring roughly 14 cm in greatest caliber of the a colon within the proximal transverse segment. No pneumatosis or gross free intraperitoneal gas. Visualized lung bases are clear. IMPRESSION: Progressive gaseous distension of the colon with maximal caliber of approximately 14 cm within the proximal transverse segment. No free air. Electronically Signed   By: Fidela Salisbury MD   On: 01/04/2021 05:27    Anti-infectives: Anti-infectives (From admission, onward)   Start     Dose/Rate Route Frequency Ordered Stop   01/02/21 1330  vancomycin (VANCOCIN) 500 mg in sodium chloride irrigation 0.9 % 100 mL ENEMA        500 mg  Rectal Every 6 hours 01/02/21 1234 01/16/21 1159   12/30/20 1200  vancomycin (VANCOCIN) 50 mg/mL oral solution 500 mg  Status:  Discontinued        500 mg Oral Every 6 hours 12/30/20 0956 01/02/21 1234   12/30/20 1200  metroNIDAZOLE (FLAGYL) IVPB 500 mg        500 mg 100 mL/hr over 60 Minutes Intravenous Every 8 hours 12/30/20 0956     12/29/20 0600  vancomycin (VANCOREADY) IVPB 1250 mg/250 mL  Status:  Discontinued        1,250 mg 166.7 mL/hr over 90 Minutes Intravenous Every 36 hours 12/27/20 2315 12/28/20 1007   12/28/20 2000  vancomycin (VANCOCIN) 50 mg/mL oral solution 125 mg  Status:  Discontinued        125 mg Oral Every 6 hours 12/28/20 1801 12/30/20 0956   12/28/20 0830  clindamycin (CLEOCIN) IVPB 900 mg  Status:  Discontinued       "And" Linked Group Details   900 mg 100 mL/hr over 30 Minutes Intravenous On call to O.R. 12/28/20 4098 12/29/20 0559   12/28/20 0830  gentamicin (GARAMYCIN) 360 mg in dextrose 5 % 100 mL IVPB  Status:  Discontinued       "And" Linked Group Details   5 mg/kg  71.2 kg 109 mL/hr over 60 Minutes Intravenous On call to O.R. 12/28/20 1191 12/29/20 0559   12/28/20 0600  ceFEPIme (MAXIPIME) 2 g in sodium chloride 0.9 % 100 mL IVPB  Status:  Discontinued        2 g 200 mL/hr over 30 Minutes Intravenous Every 12 hours 12/27/20 2315 12/29/20 0955   12/28/20 0200  metroNIDAZOLE (FLAGYL) IVPB 500 mg  Status:  Discontinued        500 mg 100 mL/hr over 60 Minutes Intravenous Every 8 hours 12/27/20 2202 12/29/20 0955   12/27/20 1830  aztreonam (AZACTAM) 2 g in sodium chloride 0.9 % 100 mL IVPB  Status:  Discontinued        2 g 200 mL/hr over 30 Minutes Intravenous  Once 12/27/20 1820 12/27/20 1823   12/27/20 1830  metroNIDAZOLE (FLAGYL) IVPB 500 mg        500 mg 100 mL/hr over 60 Minutes Intravenous  Once 12/27/20 1820 12/27/20 1935   12/27/20 1830  vancomycin (VANCOCIN) IVPB 1000 mg/200 mL premix        1,000 mg 200 mL/hr over 60 Minutes Intravenous  Once  12/27/20 1820 12/27/20 1934   12/27/20 1830  ceFEPIme (MAXIPIME) 2 g in sodium chloride 0.9 % 100 mL IVPB        2 g 200 mL/hr over 30 Minutes Intravenous  Once 12/27/20 1824 12/27/20 1903       Assessment/Plan CAD status post stenting to  RCA in 1997 COPD CKD stage III- Cr 1.34  Hypertension Hyperlipidemia BPH  Cholelithiasis Chronic cholecystitis- follow up with Dr. Redmond Pulling as outpatient as C diff source of infection this admit. - postpone lap chole until outpatient  C. Diff colitis with concern for toxic megacolon - NPO - CT 5/23 shows colon diffusely distended concerning for toxic megacolon and he is mildly diffusely tender on exam - abd xray 5/25 with progressive colonic distension - 14cm in greatest caliber. No pneumatosis or gross free intraperitoneal gas - repeat abd xray 5/26 pending - lactic acid remains normal - 1.1 - vancomycin enemas started 5/23  - discussed risk vs benefits of surgery vs continued medical management in this DNR patient with family past two days - there will be a family meeting today 1530. As of now we do not feel strongly about taking him to the OR asap but he may not ultimately recover without surgical intervention  - may need to consider TPN soon if nonoperative management is chosen and patient/family would still want continued medical management  FEN: NPO, IVF VTE: SQ heparin - would not start heparin gtt yet ID: vanc 5/17; Cefepime/flagyl 5/17>5/19, gent/clinda 5/18; PO VANC 5/19>5/23,  flagyl 5/17 >>, Vanc enemas 5/23 >>     LOS: 9 days    Winferd Humphrey, [facility] Surgery 01/05/2021, 11:58 AM Please see Amion for pager number during day hours 7:00am-4:30pm

## 2021-01-05 NOTE — Progress Notes (Addendum)
PROGRESS NOTE  Leon Estes UQJ:[id number] DOB: 04-30-1931 DOA: 12/27/2020 PCP: Venia Carbon, MD  HPI/Recap of past 35 hours: 87 year old male with history of CAD status post stenting to RCA in 1997, COPD, CKD stage III, hypertension, hyperlipidemia, BPH, recent admission at [facility] from 11/26/2020-11/29/2020 with presumed acute cholecystitis (RUQ ultrasound and CT abdomen/pelvis were equivocal. HIDA scan was consistent with cystic duct obstruction) treated medically with antibiotics and he was followed up by general surgery as an outpatient with plans for laparoscopic cholecystectomy on 01/10/2021.  He presented on 12/27/2020 with fever and generalized weakness.  On presentation, temperature was 103.1 F rectally along with tachypnea and tachycardia, WBC of 15.3, creatinine of 1.48, normal LFTs, lactic acid of 2.8 and then 3.2 and BNP of 689.3.  COVID-19 and influenza tests were negative.  Chest x-ray was negative for focal consolidation. CT abdomen/pelvis without contrast showed prostatomegaly, stable appearance of the gallbladder with mild stable central intrahepatic biliary dilatation, and extensive chronic and degenerative changes within the lower lumbar spine.  He was started on IV fluids and antibiotics.  General surgery was consulted.  Initially plan was for surgical intervention but it was postponed once stool tested positive for C. difficile and patient was started on oral vancomycin.  Hospital course has been complicated by delirium/agitation along with A. fib with RVR.  Cardiology and palliative care were also been consulted.  Recently, patient has had issues with increasing abdominal distention with imaging showing concern for toxic megacolon.  Oral vancomycin has been changed to vancomycin enema.  He is also on IV Flagyl.  01/05/21: Patient was seen and examined at his bedside.  Confused.  He denies having any nausea or abdominal pain.  Denies any cardiopulmonary  symptoms.  Assessment/Plan: Principal Problem:   Colitis due to Clostridioides difficile Active Problems:   CAD (coronary artery disease)   Benign essential HTN   COPD (chronic obstructive pulmonary disease) (HCC)   Chronic renal disease, stage III (HCC)   BPH (benign prostatic hyperplasia)   Severe sepsis (HCC)   HOH (hard of hearing)   Acute on chronic cholecystitis   Hypokalemia   Acute delirium   Palliative care by specialist   Goals of care, counseling/discussion   General weakness  Severe sepsis, present on admission  Severe [name] Leukocytosis: Resolved ?  Acute on chronic cholecystitis Elevated LFTs -Patient was recently hospitalized at [facility] with presumed cholecystitis treated with antibiotics with outpatient follow-up with general surgery with plans for laparoscopic cholecystectomy on 01/10/2021. -Presented with temperature of 102.1 F rectally along with tachypnea, tachycardia, WBC of 15.3, elevated lactic acid.  No clear-cut source of infection with negative chest x-ray. COVID-19 and influenza tests were negative.  - CT abdomen/pelvis showed stable appearance of the gallbladder with mild stable central intrahepatic biliary dilatation.  Right upper quadrant ultrasound shows cholelithiasis without acute cholecystitis.  LFTs trending downwards. -WBCs have normalized. -General surgery initially were planning for surgical intervention on 12/28/2020 but surgery was postponed because of C. difficile.  - General surgery following: CT of the abdomen pelvis on 12/30/2020 showed diffusely distended colon with concern for toxic megacolon.  Family still contemplating surgical intervention versus no intervention.  Patient is high risks surgical candidate.  Follow further general surgery recommendations.  Oral vancomycin was changed to vancomycin enema by general surgery on 12/30/2020.  Continue IV Flagyl. He is currently n.p.o.  Non anion gap metabolic acidosis Serum bicarb  17, anion gap 6 Continue IV fluids Repeat BMP this afternoon.  Leukocytosis WBC 12.8 from 10.8 Ongoing treatment for [name] Continue current management.  Paroxysmal A. fib with RVR -Not on anticoagulation as an outpatient.  -Rate better controlled currently.  Cardiology signed off on 01/01/2021.  Continue oral metoprolol if able to tolerate.  Use as needed IV metoprolol. -Echo as below shows EF of 50 to 55%  Delirium/confusion -Off restraints since yesterday.  Continue as needed Haldol and Ativan for severe agitation -vitamin W26, folic acid and TSH levels normal Reorient as needed.  CAD status post PCI -Currently stable.  No chest pain.  Continue aspirin, metoprolol, simvastatin.  Outpatient follow-up with cardiology  COPD Acute hypoxic respiratory failure -Improved.  Intermittently requiring 2 L oxygen by nasal cannula. -DC Solu-Medrol.  Continue nebs and start Pulmicort.  Refractory hypokalemia Still on supplemental potassium with IV fluids. Continue replacement  Hypernatremia -Resolved.  Continue gentle hydration with D5.  Chronic kidney disease stage IIIa -Stable.  Monitor  Hypertension: -Blood pressure intermittently on the high side.  Continue increased dose of Toprol  Hyperlipidemia: -Continue simvastatin if able to take orally.  BPH: -Continue tamsulosin able to take orally.  Generalized deconditioning -will need PT eval once more stable. -Palliative care following.  Patient has worsening colonic distention with possible toxic megacolon on top of chronic cholecystitis and I doubt that patient is any kind of surgical candidate; with intermittent delirium and agitation, his overall prognosis looks very poor at this time.   -If condition does not improve, recommend hospice/comfort measures  Goals of care Appreciate palliative care team's assistance. DNR. Family meeting planned with palliative care team at 3:30 PM to discuss goals of  care.  DVT prophylaxis: Subcutaneous heparin 3 times daily Code Status: DNR Family Communication:  None at bedside. Disposition Plan: Status is: Inpatient  Remains inpatient appropriate because:Inpatient level of care appropriate due to severity of illness   Dispo: The patient is from: Home  Anticipated d/c is to: Undetermined at the time of this visit.  Patient currently is not medically stable to d/c.              Difficult to place patient No  Consultants: General surgery/cardiology/palliative care  Procedures: Echo  Antimicrobials:             Anti-infectives (From admission, onward)     Start     Dose/Rate Route Frequency Ordered Stop   01/02/21 1330  vancomycin (VANCOCIN) 500 mg in sodium chloride irrigation 0.9 % 100 mL ENEMA        500 mg Rectal Every 6 hours 01/02/21 1234 01/16/21 1159   12/30/20 1200  vancomycin (VANCOCIN) 50 mg/mL oral solution 500 mg  Status:  Discontinued        500 mg Oral Every 6 hours 12/30/20 0956 01/02/21 1234   12/30/20 1200  metroNIDAZOLE (FLAGYL) IVPB 500 mg        500 mg 100 mL/hr over 60 Minutes Intravenous Every 8 hours 12/30/20 0956     12/29/20 0600  vancomycin (VANCOREADY) IVPB 1250 mg/250 mL  Status:  Discontinued        1,250 mg 166.7 mL/hr over 90 Minutes Intravenous Every 36 hours 12/27/20 2315 12/28/20 1007   12/28/20 2000  vancomycin (VANCOCIN) 50 mg/mL oral solution 125 mg  Status:  Discontinued        125 mg Oral Every 6 hours 12/28/20 1801 12/30/20 0956   12/28/20 0830  clindamycin (CLEOCIN) IVPB 900 mg  Status:  Discontinued       "And" Linked  Group Details   900 mg 100 mL/hr over 30 Minutes Intravenous On call to O.R. 12/28/20 6237 12/29/20 0559   12/28/20 0830  gentamicin (GARAMYCIN) 360 mg in dextrose 5 % 100 mL IVPB  Status:  Discontinued       "And" Linked Group Details   5 mg/kg  71.2 kg 109 mL/hr over 60 Minutes Intravenous On call to O.R. 12/28/20 6283 12/29/20 0559    12/28/20 0600  ceFEPIme (MAXIPIME) 2 g in sodium chloride 0.9 % 100 mL IVPB  Status:  Discontinued        2 g 200 mL/hr over 30 Minutes Intravenous Every 12 hours 12/27/20 2315 12/29/20 0955   12/28/20 0200  metroNIDAZOLE (FLAGYL) IVPB 500 mg  Status:  Discontinued        500 mg 100 mL/hr over 60 Minutes Intravenous Every 8 hours 12/27/20 2202 12/29/20 0955   12/27/20 1830  aztreonam (AZACTAM) 2 g in sodium chloride 0.9 % 100 mL IVPB  Status:  Discontinued        2 g 200 mL/hr over 30 Minutes Intravenous  Once 12/27/20 1820 12/27/20 1823   12/27/20 1830  metroNIDAZOLE (FLAGYL) IVPB 500 mg        500 mg 100 mL/hr over 60 Minutes Intravenous  Once 12/27/20 1820 12/27/20 1935   12/27/20 1830  vancomycin (VANCOCIN) IVPB 1000 mg/200 mL premix        1,000 mg 200 mL/hr over 60 Minutes Intravenous  Once 12/27/20 1820 12/27/20 1934   12/27/20 1830  ceFEPIme (MAXIPIME) 2 g in sodium chloride 0.9 % 100 mL IVPB        2 g 200 mL/hr over 30 Minutes Intravenous  Once 12/27/20 1824 12/27/20 1903           Objective: Vitals:   01/05/21 0953 01/05/21 1000 01/05/21 1100 01/05/21 1200  BP: (!) 155/92 (!) 152/75  (!) 167/89  Pulse: 92 86 71 84  Resp: (!) 22 (!) 22 (!) 21 (!) 22  Temp:    98.3 F (36.8 C)  TempSrc:    Oral  SpO2:    98%  Weight:      Height:        Intake/Output Summary (Last 24 hours) at 01/05/2021 1348 Last data filed at 01/05/2021 0500 Gross per 24 hour  Intake 2990.36 ml  Output --  Net 2990.36 ml   Filed Weights   01/02/21 0308 01/03/21 0438 01/04/21 0349  Weight: 71.3 kg 72.2 kg 74.3 kg    Exam:  . General: 87 y.o. year-old male frail-appearing in no acute distress.  Alert and confused. . Cardiovascular: Regular rate and rhythm with no rubs or gallops.  No thyromegaly or JVD noted.   Marland Kitchen Respiratory: Clear to auscultation with no wheezes or rales. Good inspiratory effort. . Abdomen: Soft nontender nondistended with normal bowel sounds x4  quadrants. . Musculoskeletal: No lower extremity edema.  . Skin: No ulcerative lesions noted or rashes, . Psychiatry: Mood is appropriate for condition and setting   Data Reviewed: CBC: Recent Labs  Lab 01/01/21 0237 01/02/21 1103 01/03/21 0240 01/04/21 0242 01/05/21 0240  WBC 14.1* 8.6 8.7 10.8* 12.8*  NEUTROABS 12.1* 7.3 7.3 8.8* 10.4*  HGB 10.6* 10.6* 10.1* 10.5* 10.6*  HCT 34.8* 33.5* 32.0* 33.2* 32.5*  MCV 91.8 89.6 89.6 88.5 87.1  PLT 265 265 245 248 151   Basic Metabolic Panel: Recent Labs  Lab 01/01/21 0237 01/02/21 1103 01/02/21 1906 01/03/21 0240 01/03/21 1909 01/04/21 0242 01/05/21 0240  NA 149* 141  --  140  --  139 138  K 3.2* 2.5* 2.8* 2.7* 3.6 3.5 2.9*  CL 118* 115*  --  115*  --  114* 115*  CO2 24 22  --  17*  --  19* 17*  GLUCOSE 184* 246*  --  219*  --  187* 150*  BUN 60* 42*  --  40*  --  36* 31*  CREATININE 1.35* 1.02  --  1.07  --  1.07 0.77  CALCIUM 9.1 8.4*  --  8.3*  --  8.5* 8.2*  MG 2.4 2.0 2.1 2.1  --  2.2 1.9   GFR: Estimated Creatinine Clearance: 60.8 mL/min (by C-G formula based on SCr of 0.77 mg/dL). Liver Function Tests: Recent Labs  Lab 01/01/21 0237 01/02/21 1103 01/03/21 0240 01/04/21 0242 01/05/21 0240  AST 93* 45* 35 27 23  ALT 55* 54* 48* 41 37  ALKPHOS 70 55 50 47 41  BILITOT 0.7 0.6 0.5 0.8 0.4  PROT 6.9 6.1* 5.3* 5.4* 5.0*  ALBUMIN 3.4* 3.1* 2.9* 2.9* 2.6*   No results for input(s): LIPASE, AMYLASE in the last 168 hours. Recent Labs  Lab 12/30/20 0751  AMMONIA 14   Coagulation Profile: No results for input(s): INR, PROTIME in the last 168 hours. Cardiac Enzymes: No results for input(s): CKTOTAL, CKMB, CKMBINDEX, TROPONINI in the last 168 hours. BNP (last 3 results) No results for input(s): PROBNP in the last 8760 hours. HbA1C: No results for input(s): HGBA1C in the last 72 hours. CBG: No results for input(s): GLUCAP in the last 168 hours. Lipid Profile: No results for input(s): CHOL, HDL, LDLCALC,  TRIG, CHOLHDL, LDLDIRECT in the last 72 hours. Thyroid Function Tests: No results for input(s): TSH, T4TOTAL, FREET4, T3FREE, THYROIDAB in the last 72 hours. Anemia Panel: No results for input(s): VITAMINB12, FOLATE, FERRITIN, TIBC, IRON, RETICCTPCT in the last 72 hours. Urine analysis:    Component Value Date/Time   COLORURINE AMBER (A) 12/27/2020 2214   APPEARANCEUR HAZY (A) 12/27/2020 2214   APPEARANCEUR Clear 04/29/2013 1543   LABSPEC 1.039 (H) 12/27/2020 2214   LABSPEC 1.020 04/29/2013 1543   PHURINE 5.0 12/27/2020 2214   GLUCOSEU NEGATIVE 12/27/2020 2214   GLUCOSEU Negative 04/29/2013 1543   HGBUR SMALL (A) 12/27/2020 2214   BILIRUBINUR NEGATIVE 12/27/2020 2214   BILIRUBINUR + 07/25/2018 1304   BILIRUBINUR Negative 04/29/2013 1543   KETONESUR 5 (A) 12/27/2020 2214   PROTEINUR >=300 (A) 12/27/2020 2214   UROBILINOGEN 0.2 07/25/2018 1304   UROBILINOGEN 0.2 03/08/2012 1624   NITRITE NEGATIVE 12/27/2020 2214   LEUKOCYTESUR NEGATIVE 12/27/2020 2214   LEUKOCYTESUR Negative 04/29/2013 1543   Sepsis Labs: @LABRCNTIP (procalcitonin:4,lacticidven:4)  ) Recent Results (from the past 240 hour(s))  Blood Culture (routine x 2)     Status: None   Collection Time: 12/27/20  5:52 PM   Specimen: BLOOD  Result Value Ref Range Status   Specimen Description   Final    BLOOD SITE NOT SPECIFIED Performed at [facility] [address] [place], [place] [address]    Special Requests   Final    BOTTLES DRAWN AEROBIC AND ANAEROBIC Blood Culture adequate volume Performed at [place] [address], [place], [place] [address]    Culture   Final    NO GROWTH 5 DAYS Performed at [facility], [place] [address], [place], [place] [address]    Report Status 01/01/2021 FINAL  Final  Blood Culture (routine x 2)  Status: None   Collection Time: 12/27/20  6:19 PM   Specimen: BLOOD  Result Value Ref Range Status   Specimen Description   Final    BLOOD LEFT  ANTECUBITAL Performed at [facility] [address], [place], [place] [address]    Special Requests   Final    BOTTLES DRAWN AEROBIC AND ANAEROBIC Blood Culture results may not be optimal due to an inadequate volume of blood received in culture bottles Performed at [place] [address], [place], [place] [address]    Culture   Final    NO GROWTH 5 DAYS Performed at [facility], [place] [address], [place], [place] [address]    Report Status 01/01/2021 FINAL  Final  Resp Panel by RT-PCR (Flu A&B, Covid) Nasopharyngeal Swab     Status: None   Collection Time: 12/27/20  6:21 PM   Specimen: Nasopharyngeal Swab; Nasopharyngeal(NP) swabs in vial transport medium  Result Value Ref Range Status   SARS Coronavirus 2 by RT PCR NEGATIVE NEGATIVE Final    Comment: (NOTE) SARS-CoV-2 target nucleic acids are NOT DETECTED.  The SARS-CoV-2 RNA is generally detectable in upper respiratory specimens during the acute phase of infection. The lowest concentration of SARS-CoV-2 viral copies this assay can detect is 138 copies/mL. A negative result does not preclude SARS-Cov-2 infection and should not be used as the sole basis for treatment or other patient management decisions. A negative result may occur with  improper specimen collection/handling, submission of specimen other than nasopharyngeal swab, presence of viral mutation(s) within the areas targeted by this assay, and inadequate number of viral copies(<138 copies/mL). A negative result must be combined with clinical observations, patient history, and epidemiological information. The expected result is Negative.  Fact Sheet for Patients:  EntrepreneurPulse.com.au  Fact Sheet for Healthcare Providers:  IncredibleEmployment.be  This test is no t yet approved or cleared by the [place] FDA and  has been authorized for detection and/or diagnosis of  SARS-CoV-2 by FDA under an Emergency Use Authorization (EUA). This EUA will remain  in effect (meaning this test can be used) for the duration of the COVID-19 declaration under Section 564(b)(1) of the Act, 21 U.S.C.section 360bbb-3(b)(1), unless the authorization is terminated  or revoked sooner.       Influenza A by PCR NEGATIVE NEGATIVE Final   Influenza B by PCR NEGATIVE NEGATIVE Final    Comment: (NOTE) The Xpert Xpress SARS-CoV-2/FLU/RSV plus assay is intended as an aid in the diagnosis of influenza from Nasopharyngeal swab specimens and should not be used as a sole basis for treatment. Nasal washings and aspirates are unacceptable for Xpert Xpress SARS-CoV-2/FLU/RSV testing.  Fact Sheet for Patients: EntrepreneurPulse.com.au  Fact Sheet for Healthcare Providers: IncredibleEmployment.be  This test is not yet approved or cleared by the [place] FDA and has been authorized for detection and/or diagnosis of SARS-CoV-2 by FDA under an Emergency Use Authorization (EUA). This EUA will remain in effect (meaning this test can be used) for the duration of the COVID-19 declaration under Section 564(b)(1) of the Act, 21 U.S.C. section 360bbb-3(b)(1), unless the authorization is terminated or revoked.  Performed at [facility], [place] [address], [place], [place] [address]   Urine culture     Status: Abnormal   Collection Time: 12/27/20 10:14 PM   Specimen: In/Out Cath Urine  Result Value Ref Range Status   Specimen Description   Final    IN/OUT CATH URINE Performed at [facility]  [place] [address] [place], [place] [address]    Special Requests   Final    NONE Performed at [facility], [place] [address], [place], [place] [address]    Culture MULTIPLE SPECIES PRESENT, SUGGEST RECOLLECTION (A)  Final   Report Status 12/29/2020 FINAL  Final  MRSA PCR Screening     Status: Abnormal    Collection Time: 12/28/20  8:46 AM   Specimen: Nasal Mucosa; Nasopharyngeal  Result Value Ref Range Status   MRSA by PCR POSITIVE (A) NEGATIVE Final    Comment:        The GeneXpert MRSA Assay (FDA approved for NASAL specimens only), is one component of a comprehensive MRSA colonization surveillance program. It is not intended to diagnose MRSA infection nor to guide or monitor treatment for MRSA infections. RESULT CALLED TO, READ BACK BY AND VERIFIED WITH: Leida Lauth RN AT 1112 12/28/20 MULLINS,T Performed at [facility], [place] [address], [place], [place] [address]   C Difficile Quick Screen w PCR reflex     Status: Abnormal   Collection Time: 12/28/20 11:43 AM   Specimen: STOOL  Result Value Ref Range Status   C Diff antigen POSITIVE (A) NEGATIVE Final   C Diff toxin POSITIVE (A) NEGATIVE Final   C Diff interpretation Toxin producing C. difficile detected.  Final    Comment: CRITICAL RESULT CALLED TO, READ BACK BY AND VERIFIED WITH: L.CAUDLE, RN AT 1757 ON 05.18.22 BY N.THOMPSON Performed at [place] [address] [place], [place] [address]   Gastrointestinal Panel by PCR , Stool     Status: None   Collection Time: 12/28/20 11:43 AM   Specimen: STOOL  Result Value Ref Range Status   Campylobacter species NOT DETECTED NOT DETECTED Final   Plesimonas shigelloides NOT DETECTED NOT DETECTED Final   Salmonella species NOT DETECTED NOT DETECTED Final   Yersinia enterocolitica NOT DETECTED NOT DETECTED Final   Vibrio species NOT DETECTED NOT DETECTED Final   Vibrio cholerae NOT DETECTED NOT DETECTED Final   Enteroaggregative E coli (EAEC) NOT DETECTED NOT DETECTED Final   Enteropathogenic E coli (EPEC) NOT DETECTED NOT DETECTED Final   Enterotoxigenic E coli (ETEC) NOT DETECTED NOT DETECTED Final   Shiga like toxin producing E coli (STEC) NOT DETECTED NOT DETECTED Final   Shigella/Enteroinvasive E coli (EIEC) NOT DETECTED NOT DETECTED  Final   Cryptosporidium NOT DETECTED NOT DETECTED Final   Cyclospora cayetanensis NOT DETECTED NOT DETECTED Final   Entamoeba histolytica NOT DETECTED NOT DETECTED Final   Giardia lamblia NOT DETECTED NOT DETECTED Final   Adenovirus F40/41 NOT DETECTED NOT DETECTED Final   Astrovirus NOT DETECTED NOT DETECTED Final   Norovirus GI/GII NOT DETECTED NOT DETECTED Final   Rotavirus A NOT DETECTED NOT DETECTED Final   Sapovirus (I, II, IV, and V) NOT DETECTED NOT DETECTED Final    Comment: Performed at [facility], [address] [place], [place] [address]      Studies: DG Abd Portable 1V  Result Date: 01/05/2021 CLINICAL DATA:  Abdominal distension. EXAM: PORTABLE ABDOMEN - 1 VIEW COMPARISON:  Jan 04, 2021. FINDINGS: Stable gaseous distention of the colon is noted with the proximal transverse colon measuring 14 cm in diameter, which is not changed compared to prior exam. No definite small bowel dilatation is noted. IMPRESSION: Stable gaseous distention of the colon is noted as described above. Electronically Signed   By: Marijo Conception M.D.   On:  01/05/2021 12:54    Scheduled Meds: . aspirin EC  81 mg Oral Daily  . budesonide (PULMICORT) nebulizer solution  0.5 mg Nebulization BID  . Chlorhexidine Gluconate Cloth  6 each Topical Daily  . heparin  5,000 Units Subcutaneous Q8H  . ipratropium-albuterol  3 mL Nebulization BID  . mouth rinse  15 mL Mouth Rinse BID  . methylPREDNISolone (SOLU-MEDROL) injection  40 mg Intravenous Q24H  . metoprolol tartrate  25 mg Oral Q6H  . pantoprazole  40 mg Oral Daily  . simvastatin  40 mg Oral QPM  . sodium chloride flush  3 mL Intravenous Q12H  . tamsulosin  0.4 mg Oral Daily  . vancomycin (VANCOCIN) rectal ENEMA  500 mg Rectal Q6H    Continuous Infusions: . dextrose 5 % with kcl 75 mL/hr at 01/05/21 1311  . metronidazole Stopped (01/05/21 1246)     LOS: 9 days     Kayleen Memos, MD [facility] Pager [phone]  If  7PM-7AM, please contact night-coverage [email or web address] Password [email or web address] 01/05/2021, 1:48 PM

## 2021-01-05 NOTE — Progress Notes (Signed)
Apparently there is no vancomycin enemas available in the [facility] health system, so request made to try and provide orally.  Hospitalist wishes surgery to make this decision.  Okay to try PO route tonight and revisit the issue in the morning.

## 2021-01-06 ENCOUNTER — Inpatient Hospital Stay (HOSPITAL_COMMUNITY): Payer: PPO

## 2021-01-06 ENCOUNTER — Encounter (HOSPITAL_COMMUNITY): Payer: PPO

## 2021-01-06 ENCOUNTER — Other Ambulatory Visit (HOSPITAL_COMMUNITY): Payer: PPO

## 2021-01-06 DIAGNOSIS — A0472 Enterocolitis due to Clostridium difficile, not specified as recurrent: Secondary | ICD-10-CM | POA: Diagnosis not present

## 2021-01-06 DIAGNOSIS — Z7189 Other specified counseling: Secondary | ICD-10-CM | POA: Diagnosis not present

## 2021-01-06 DIAGNOSIS — R531 Weakness: Secondary | ICD-10-CM | POA: Diagnosis not present

## 2021-01-06 DIAGNOSIS — Z515 Encounter for palliative care: Secondary | ICD-10-CM | POA: Diagnosis not present

## 2021-01-06 LAB — PREALBUMIN: Prealbumin: 26.5 mg/dL (ref 18–38)

## 2021-01-06 LAB — BASIC METABOLIC PANEL
Anion gap: 5 (ref 5–15)
BUN: 19 mg/dL (ref 8–23)
CO2: 22 mmol/L (ref 22–32)
Calcium: 8 mg/dL — ABNORMAL LOW (ref 8.9–10.3)
Chloride: 109 mmol/L (ref 98–111)
Creatinine, Ser: 0.84 mg/dL (ref 0.61–1.24)
GFR, Estimated: >60 mL/min (ref >60)
Glucose, Bld: 231 mg/dL — ABNORMAL HIGH (ref 70–99)
Potassium: 3 mmol/L — ABNORMAL LOW (ref 3.5–5.1)
Sodium: 136 mmol/L (ref 135–145)

## 2021-01-06 LAB — COMPREHENSIVE METABOLIC PANEL
ALT: 34 U/L (ref 0–44)
AST: 23 U/L (ref 15–41)
Albumin: 2.6 g/dL — ABNORMAL LOW (ref 3.5–5.0)
Alkaline Phosphatase: 39 U/L (ref 38–126)
Anion gap: 8 (ref 5–15)
BUN: 23 mg/dL (ref 8–23)
CO2: 19 mmol/L — ABNORMAL LOW (ref 22–32)
Calcium: 8.2 mg/dL — ABNORMAL LOW (ref 8.9–10.3)
Chloride: 113 mmol/L — ABNORMAL HIGH (ref 98–111)
Creatinine, Ser: 0.74 mg/dL (ref 0.61–1.24)
GFR, Estimated: >60 mL/min (ref >60)
Glucose, Bld: 138 mg/dL — ABNORMAL HIGH (ref 70–99)
Potassium: 2.8 mmol/L — ABNORMAL LOW (ref 3.5–5.1)
Sodium: 140 mmol/L (ref 135–145)
Total Bilirubin: 0.5 mg/dL (ref 0.3–1.2)
Total Protein: 5.1 g/dL — ABNORMAL LOW (ref 6.5–8.1)

## 2021-01-06 LAB — GLUCOSE, CAPILLARY
Glucose-Capillary: 147 mg/dL — ABNORMAL HIGH (ref 70–99)
Glucose-Capillary: 124 mg/dL — ABNORMAL HIGH (ref 70–99)
Glucose-Capillary: 179 mg/dL — ABNORMAL HIGH (ref 70–99)
Glucose-Capillary: 218 mg/dL — ABNORMAL HIGH (ref 70–99)
Glucose-Capillary: 166 mg/dL — ABNORMAL HIGH (ref 70–99)

## 2021-01-06 LAB — DIFFERENTIAL
Abs Immature Granulocytes: 0.28 10*3/uL — ABNORMAL HIGH (ref 0.00–0.07)
Basophils Absolute: 0 10*3/uL (ref 0.0–0.1)
Basophils Relative: 0 %
Eosinophils Absolute: 0 10*3/uL (ref 0.0–0.5)
Eosinophils Relative: 0 %
Immature Granulocytes: 3 %
Lymphocytes Relative: 8 %
Lymphs Abs: 0.9 10*3/uL (ref 0.7–4.0)
Monocytes Absolute: 0.9 10*3/uL (ref 0.1–1.0)
Monocytes Relative: 8 %
Neutro Abs: 9 10*3/uL — ABNORMAL HIGH (ref 1.7–7.7)
Neutrophils Relative %: 81 %

## 2021-01-06 LAB — TRIGLYCERIDES: Triglycerides: 85 mg/dL (ref <150)

## 2021-01-06 LAB — CBC
HCT: 33.3 % — ABNORMAL LOW (ref 39.0–52.0)
Hemoglobin: 10.6 g/dL — ABNORMAL LOW (ref 13.0–17.0)
MCH: 27.9 pg (ref 26.0–34.0)
MCHC: 31.8 g/dL (ref 30.0–36.0)
MCV: 87.6 fL (ref 80.0–100.0)
Platelets: 230 10*3/uL (ref 150–400)
RBC: 3.8 MIL/uL — ABNORMAL LOW (ref 4.22–5.81)
RDW: 14.7 % (ref 11.5–15.5)
WBC: 11.1 10*3/uL — ABNORMAL HIGH (ref 4.0–10.5)
nRBC: 0 % (ref 0.0–0.2)

## 2021-01-06 LAB — MAGNESIUM: Magnesium: 1.8 mg/dL (ref 1.7–2.4)

## 2021-01-06 LAB — PHOSPHORUS: Phosphorus: 2.1 mg/dL — ABNORMAL LOW (ref 2.5–4.6)

## 2021-01-06 MED ORDER — POTASSIUM PHOSPHATES 15 MMOLE/5ML IV SOLN
20.0000 mmol | Freq: Once | INTRAVENOUS | Status: AC
  Administered 2021-01-06: 20 mmol via INTRAVENOUS
  Filled 2021-01-06: qty 6.67

## 2021-01-06 MED ORDER — TRAVASOL 10 % IV SOLN
INTRAVENOUS | Status: AC
  Filled 2021-01-06: qty 432

## 2021-01-06 MED ORDER — VANCOMYCIN 50 MG/ML ORAL SOLUTION
500.0000 mg | Freq: Four times a day (QID) | ORAL | Status: DC
  Administered 2021-01-07: 500 mg via ORAL
  Filled 2021-01-06 (×6): qty 10

## 2021-01-06 MED ORDER — SODIUM CHLORIDE 0.9% FLUSH
10.0000 mL | INTRAVENOUS | Status: DC | PRN
  Administered 2021-01-12 – 2021-01-18 (×2): 10 mL

## 2021-01-06 MED ORDER — SODIUM CHLORIDE 0.9% FLUSH
10.0000 mL | Freq: Two times a day (BID) | INTRAVENOUS | Status: DC
  Administered 2021-01-06 (×2): 10 mL
  Administered 2021-01-07: 20 mL
  Administered 2021-01-17 – 2021-01-20 (×3): 10 mL

## 2021-01-06 MED ORDER — POTASSIUM CHLORIDE 10 MEQ/100ML IV SOLN
10.0000 meq | INTRAVENOUS | Status: AC
  Administered 2021-01-06 (×3): 10 meq via INTRAVENOUS
  Filled 2021-01-06 (×3): qty 100

## 2021-01-06 MED ORDER — INSULIN ASPART 100 UNIT/ML IJ SOLN
0.0000 [IU] | Freq: Four times a day (QID) | INTRAMUSCULAR | Status: DC
  Administered 2021-01-06: 3 [IU] via SUBCUTANEOUS
  Administered 2021-01-06: 2 [IU] via SUBCUTANEOUS
  Administered 2021-01-07 – 2021-01-08 (×4): 1 [IU] via SUBCUTANEOUS
  Administered 2021-01-08: 2 [IU] via SUBCUTANEOUS
  Administered 2021-01-08: 1 [IU] via SUBCUTANEOUS
  Administered 2021-01-09: 2 [IU] via SUBCUTANEOUS
  Administered 2021-01-09: 1 [IU] via SUBCUTANEOUS
  Administered 2021-01-09: 2 [IU] via SUBCUTANEOUS
  Administered 2021-01-09: 1 [IU] via SUBCUTANEOUS
  Administered 2021-01-09: 2 [IU] via SUBCUTANEOUS
  Administered 2021-01-10: 1 [IU] via SUBCUTANEOUS
  Administered 2021-01-10: 2 [IU] via SUBCUTANEOUS
  Administered 2021-01-10: 3 [IU] via SUBCUTANEOUS
  Administered 2021-01-11 (×2): 2 [IU] via SUBCUTANEOUS

## 2021-01-06 MED ORDER — POTASSIUM CHLORIDE 2 MEQ/ML IV SOLN
INTRAVENOUS | Status: AC
  Filled 2021-01-06 (×2): qty 1000

## 2021-01-06 NOTE — Progress Notes (Signed)
PHARMACY - TOTAL PARENTERAL NUTRITION CONSULT NOTE   Indication: Prolonged NPO, Cdiff  Patient Measurements: Height: 5\' 7"  (170.2 cm) Weight: 74.3 kg (163 lb 12.8 oz) IBW/kg (Calculated) : 66.1 TPN AdjBW (KG): 71.2 Body mass index is 25.66 kg/m. Usual Weight:   Assessment: 39 yoM admitted on 5/17 with fever and weakness.  PMH significant for planned outpatient lap cholecystectomy on 5/31, recent inpt admission at [facility] 4/16-4/19, CAD s/p stent, COPD, CKD, HTN, HLD, BPH.  Surgical intervention has been postponed d/t severe Cdiff and sepsis, concern for toxic megacolon.  He was briefly on CLD from 5/21-5/23, but has been otherwise NPO.    Glucose / Insulin: No hx DM or any DM medications.  Glucose 147. - SoluMedrol 125 mg x1 (5/20) > 60mg  q6h (5/21-5/22) > 40 MG Q8H (5/22-5/23) > 40 mg q12h (5/23-5/24) > 40 mg Q24h (5/25- ) Electrolytes: K low at 2.8, Phos low at 2.1, Cl elevated, CO2 low.  Others WNL.  CorrCa 9.3 Renal: SCr 0.74, BUN 23 Hepatic: LFTs and Tbili WNL Prealbumin: 26.5  Intake / Output; MIVF: D5 KCl 40 mEq/L at 75 ml/hr. - UOP 1 ml/kg/hr - Stool:  x4 occurrences yesterday GI Imaging: 5/23 CT colon is diffusely distended and fluid-filled to the rectum,concerning for toxic megacolon; trace ascites, anasarca; Prostatomegaly, CAD 5/26 AXR:  Stable, unchanged gaseous distention of the colon; No definite small bowel dilatation is noted. GI Surgeries / Procedures:   Central access: PICC 5/26 TPN start date: 5/27  Nutritional Goals (per RD recommendation on 5/25): Kcal:  1600-1800 kcal, Protein:  70-85 grams, Fluid:  >/= 1.8 L/day Goal TPN rate is 75 mL/hr (provides 81 g of protein and 1760 kcals per day)  Current Nutrition:  NPO  Plan:  Now:  KPhos 20 mmol IV x1 dose. (also provides ~ 30 mEq K+) MD ordered KCl 10 mEq x3 doses (total K+ 60 mEq today)  Start TPN at 40 mL/hr at 1800  Electrolytes in TPN: Na 38mEq/L, K 14mEq/L, Ca 79mEq/L, Mg 55mEq/L, and Phos 59mmol/L. Cl:Ac  - max Acetate.  Add standard MVI and trace elements to TPN CBG and Sensitive SSI q6h  Reduce MIVF to 35 mL/hr at 1800 Monitor TPN labs on Mon/Thurs.   Gretta Arab PharmD, BCPS Clinical Pharmacist [facility] main pharmacy [phone] 01/06/2021,7:09 AM

## 2021-01-06 NOTE — Progress Notes (Signed)
Peripherally Inserted Central Catheter Placement  The IV Nurse has discussed with the patient and/or persons authorized to consent for the patient, the purpose of this procedure and the potential benefits and risks involved with this procedure.  The benefits include less needle sticks, lab draws from the catheter, and the patient may be discharged home with the catheter. Risks include, but not limited to, infection, bleeding, blood clot (thrombus formation), and puncture of an artery; nerve damage and irregular heartbeat and possibility to perform a PICC exchange if needed/ordered by physician.  Alternatives to this procedure were also discussed.  [name] Power PICC patient education guide, fact sheet on infection prevention and patient information card has been provided to patient /or left at bedside.    PICC Placement Documentation  PICC Double Lumen 01/06/21 PICC Right Brachial 38 cm 0 cm (Active)  Indication for Insertion or Continuance of Line Administration of hyperosmolar/irritating solutions (i.e. TPN, Vancomycin, etc.) 01/06/21 0900  Exposed Catheter (cm) 0 cm 01/06/21 0900  Site Assessment Clean;Dry;Intact 01/06/21 0900  Lumen #1 Status Flushed;Blood return noted 01/06/21 0900  Lumen #2 Status Flushed;Blood return noted 01/06/21 0900  Dressing Type Transparent 01/06/21 0900  Dressing Status Clean;Dry;Intact 01/06/21 0900  Antimicrobial disc in place? Yes 01/06/21 0900  Dressing Change Due 01/13/21 01/06/21 0900    Telephone consent   Jule Economy Horton 01/06/2021, 9:25 AM

## 2021-01-06 NOTE — Progress Notes (Signed)
PHARMACY NOTE:  ANTIMICROBIAL RENAL DOSAGE ADJUSTMENT  Current antimicrobial regimen includes a mismatch between antimicrobial dosage and estimated renal function.  As per policy approved by the Pharmacy & Therapeutics and Medical Executive Committees, the antimicrobial dosage will be adjusted accordingly.  Current antimicrobial dosage:  Vanc PO 125 mg every 6 hours   Indication: Fulminant C Diff   Renal Function:  Estimated Creatinine Clearance: 60.8 mL/min (by C-G formula based on SCr of 0.74 mg/dL). []      On intermittent HD, scheduled: []      On CRRT    Antimicrobial dosage has been changed to: Vanc PO 500 mg every 6 hours   Additional comments: Adjusting oral dosage to fulminant dosing while awaiting enema supplies   Thank you for allowing pharmacy to be a part of this patient's care.  Jimmy Footman, PharmD, BCPS, BCIDP Infectious Diseases Clinical Pharmacist Phone: [phone] 01/06/2021 11:52 AM

## 2021-01-06 NOTE — Progress Notes (Signed)
Daily Progress Note   Patient Name: Leon Estes       Date: 01/06/2021 DOB: 1931-03-25  Age: 87 y.o. MRN#: [id number] Attending Physician: Kayleen Memos, DO Primary Care Physician: Venia Carbon, MD Admit Date: 12/27/2020  Reason for Consultation/Follow-up: Establishing goals of care  Subjective: Resting in bed, less alert wife and daughter at bedside, to continue with medical management, to continue with TPN trial.    Length of Stay: 10  Current Medications: Scheduled Meds:  . aspirin EC  81 mg Oral Daily  . budesonide (PULMICORT) nebulizer solution  0.5 mg Nebulization BID  . Chlorhexidine Gluconate Cloth  6 each Topical Daily  . heparin  5,000 Units Subcutaneous Q8H  . insulin aspart  0-9 Units Subcutaneous Q6H  . ipratropium-albuterol  3 mL Nebulization BID  . mouth rinse  15 mL Mouth Rinse BID  . methylPREDNISolone (SOLU-MEDROL) injection  40 mg Intravenous Q24H  . metoprolol tartrate  25 mg Oral Q6H  . pantoprazole  40 mg Oral Daily  . simvastatin  40 mg Oral QPM  . sodium chloride flush  10-40 mL Intracatheter Q12H  . sodium chloride flush  3 mL Intravenous Q12H  . tamsulosin  0.4 mg Oral Daily  . vancomycin  500 mg Oral Q6H  . vancomycin (VANCOCIN) rectal ENEMA  500 mg Rectal Q6H    Continuous Infusions: . dextrose 5 % with kcl 75 mL/hr at 01/06/21 1531  . dextrose 5 % with kcl    . metronidazole Stopped (01/06/21 1235)  . potassium PHOSPHATE IVPB (in mmol) Stopped (01/06/21 1612)  . TPN ADULT (ION)      PRN Meds: acetaminophen, albuterol, haloperidol lactate, labetalol, methocarbamol, metoprolol tartrate, ondansetron (ZOFRAN) IV, sodium chloride flush  Physical Exam Vitals and nursing note reviewed.  Cardiovascular:     Rate and Rhythm: Normal rate.  Rhythm irregular.  Pulmonary:     Effort: Pulmonary effort is normal.   resting in bed, less alert today ROS: unable to to obtain due to altered mental status  Vital Signs: BP 133/69 (BP Location: Right Arm)   Pulse 90   Temp 97.6 F (36.4 C) (Axillary)   Resp 19   Ht 5\' 7"  (1.702 m)   Wt 74.3 kg   SpO2 97%   BMI 25.66 kg/m  SpO2: SpO2: 97 %  O2 Device: O2 Device: Room Air O2 Flow Rate: O2 Flow Rate (L/min): 2 L/min  Intake/output summary:   Intake/Output Summary (Last 24 hours) at 01/06/2021 1535 Last data filed at 01/06/2021 1531 Gross per 24 hour  Intake 3504.62 ml  Output 1700 ml  Net 1804.62 ml   LBM: Last BM Date: 01/06/21 Baseline Weight: Weight: 71.2 kg Most recent weight: Weight: 74.3 kg      Flowsheet Rows   Flowsheet Row Most Recent Value  Intake Tab   Referral Department Hospitalist  Unit at Time of Referral ICU  Palliative Care Primary Diagnosis Sepsis/Infectious Disease  Date Notified 12/30/20  Palliative Care Type New Palliative care  Reason for referral Clarify Goals of Care  Date of Admission 12/27/20  Date first seen by Palliative Care 12/31/20  # of days Palliative referral response time 1 Day(s)  # of days IP prior to Palliative referral 3  Clinical Assessment   Palliative Performance Scale Score 20%  Psychosocial & Spiritual Assessment   Palliative Care Outcomes   Patient/Family meeting held? Yes  Who was at the meeting? Wife, son, daughter  Palliative Care Outcomes Clarified goals of care      Patient Active Problem List   Diagnosis Date Noted  . 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 Palliative care by specialist   . Goals of care, counseling/discussion   . General weakness   . Colitis due to Clostridioides difficile 12/31/2020  . Acute delirium 12/31/2020  . HOH (hard of hearing) 12/28/2020  . Acute on chronic cholecystitis 12/28/2020  . Hypokalemia 12/28/2020  . Severe sepsis ([place]) 12/27/2020  . Arrhythmia 12/06/2020  . Pulmonary nodule  12/06/2020  . Acute cholecystitis 11/26/2020  . Preventative health care 10/12/2020  . Macular degeneration, wet ([place]) 10/12/2020  . Acquired claw toe, left 02/02/2020  . BPH (benign prostatic hyperplasia) 01/13/2020  . Neuropathy 02/18/2018  . COPD (chronic obstructive pulmonary disease) ([place])   . Chronic renal disease, stage III ([place])   . Benign essential HTN 04/09/2017  . CAD (coronary artery disease) 03/03/2015  . Hyperlipidemia 03/03/2015    Palliative Care Assessment & Plan   Patient Profile: 87 y.o. male  with past medical history of CHF, COPD, choledocholithiasis, CAD status post PCI, COPD, CKD, hypertension, hyperlipidemia, BPH admitted on 12/27/2020 with sepsis.  He was recently admitted to [facility] 4/16 through 4/19 for presumed cholecystitis.  He was being followed by Dr. Redmond Pulling for cholecystectomy later this month.  Initially, there was concern that his presentation was related to cholecystitis again, however, with further work-up it appears that this is actually more from C. difficile.  His hospital course has been further complicated by A. fib, COPD exacerbation, and development of agitation/delirium.  Palliative consulted for goals of care.  Recommendations/Plan:  Continue current interventions for now  Family would likely decline surgery if needed in the future but are not prepared to make a definitive decision at this time  likely proceed with IV TPN   Psychosocial and emotional support provided  Ongoing discussions and support from PMT  Code Status:  DNR  Code Status Orders  (From admissi  on, onward)         Start     Ordered   12/29/20 1002  Do not attempt resuscitation (DNR)  Continuous       Question Answer Comment  In the event of cardiac or respiratory ARREST Do not call a "code blue"   In the event of cardiac or respiratory ARREST Do not perform  Intubation, CPR, defibrillation or ACLS   In the event of cardiac or respiratory ARREST Use medication by  any route, position, wound care, and other measures to relive pain and suffering. May use oxygen, suction and manual treatment of airway obstruction as needed for comfort.      12/29/20 1001        Code Status History    Date Active Date Inactive Code Status Order ID Comments User Context   12/27/2020 2202 12/29/2020 1001 Full Code [id number]  Lenore Cordia, MD ED   11/26/2020 1947 11/29/2020 1648 Full Code [id number]  Kayleen Memos, DO ED   Advance Care Planning Activity     Prognosis:  Guarded  Discharge Planning:  To Be Determined  Care plan was discussed with  Wife and daughter.  Total time: 25 minutes Greater than 50% of this time was spent in counseling and coordinating care related to the above assessment and plan.  Loistine Chance MD Palliative Medicine Team Team phone # [phone]  Thank you for allowing the Palliative Medicine Team to assist in the care of this patient. Please utilize secure chat with additional questions, if there is no response within 30 minutes please call the above phone number.  Palliative Medicine Team providers are available by phone from 7am to 7pm daily and can be reached through the team cell phone.  Should this patient require assistance outside of these hours, please call the patient's attending physician.

## 2021-01-06 NOTE — Progress Notes (Signed)
OT Cancellation Note  Patient Details Name: Leon Estes MRN: [id number] DOB: 21-Aug-1930   Cancelled Treatment:    Reason Eval/Treat Not Completed: Fatigue/lethargy limiting ability to participate. RN requests therapy hold today and allow patient to rest. Will f/u as able.  Annmargaret Decaprio L Ferrell Flam 01/06/2021, 11:36 AM

## 2021-01-06 NOTE — Progress Notes (Signed)
PROGRESS NOTE  Leon Estes UDJ:[id number] DOB: [birth year] DOA: 12/27/2020 PCP: Venia Carbon, MD  HPI/Recap of past 51 hours: 87 year old male with history of CAD status post stenting to RCA in 1997, COPD, CKD stage III, hypertension, hyperlipidemia, BPH, recent admission at [facility] from 11/26/2020-11/29/2020 with presumed acute cholecystitis (RUQ ultrasound and CT abdomen/pelvis were equivocal. HIDA scan was consistent with cystic duct obstruction) treated medically with antibiotics and he was followed up by general surgery as an outpatient with plans for laparoscopic cholecystectomy on 01/10/2021.  He presented on 12/27/2020 with fever and generalized weakness.  On presentation, temperature was 103.1 F rectally along with tachypnea and tachycardia, WBC of 15.3, creatinine of 1.48, normal LFTs, lactic acid of 2.8 and then 3.2 and BNP of 689.3.  COVID-19 and influenza tests were negative.  Chest x-ray was negative for focal consolidation. CT abdomen/pelvis without contrast showed prostatomegaly, stable appearance of the gallbladder with mild stable central intrahepatic biliary dilatation, and extensive chronic and degenerative changes within the lower lumbar spine.  He was started on IV fluids and antibiotics.  General surgery was consulted.  Initially plan was for surgical intervention but it was postponed once stool tested positive for C. difficile and patient was started on oral vancomycin.  Hospital course has been complicated by delirium/agitation along with A. fib with RVR.  Cardiology and palliative care were also been consulted.  Recently, patient has had issues with increasing abdominal distention with imaging showing concern for toxic megacolon.  Oral vancomycin has been changed to vancomycin enema.  He is also on IV Flagyl.  01/06/21: Lethargic this morning after receiving dose of IV Ativan overnight around 2 AM as well as Haldol.  DC Ativan as it can cause more  confusion.  Assessment/Plan: Principal Problem:   Colitis due to Clostridioides difficile Active Problems:   CAD (coronary artery disease)   Benign essential HTN   COPD (chronic obstructive pulmonary disease) (HCC)   Chronic renal disease, stage III (HCC)   BPH (benign prostatic hyperplasia)   Severe sepsis (HCC)   HOH (hard of hearing)   Acute on chronic cholecystitis   Hypokalemia   Acute delirium   Palliative care by specialist   Goals of care, counseling/discussion   General weakness   Toxic megacolon ([place])  Severe sepsis, present on admission  Severe [name] Leukocytosis: Resolved ?  Acute on chronic cholecystitis Elevated LFTs -Patient was recently hospitalized at [facility] with presumed cholecystitis treated with antibiotics with outpatient follow-up with general surgery with plans for laparoscopic cholecystectomy on 01/10/2021. -Presented with temperature of 102.1 F rectally along with tachypnea, tachycardia, WBC of 15.3, elevated lactic acid.  No clear-cut source of infection with negative chest x-ray. COVID-19 and influenza tests were negative.  - CT abdomen/pelvis showed stable appearance of the gallbladder with mild stable central intrahepatic biliary dilatation.  Right upper quadrant ultrasound shows cholelithiasis without acute cholecystitis.  LFTs trending downwards. -WBCs have normalized. -General surgery initially were planning for surgical intervention on 12/28/2020 but surgery was postponed because of C. difficile.  - General surgery following: CT of the abdomen pelvis on 12/30/2020 showed diffusely distended colon with concern for toxic megacolon.  Family still contemplating surgical intervention versus no intervention.  Patient is high risks surgical candidate.  Follow further general surgery recommendations.  Oral vancomycin was changed to vancomycin enema by general surgery on 12/30/2020.  Continue IV Flagyl. He is currently n.p.o., TPN was started on  01/05/2021.  Delirium/confusion Avoid benzodiazepines. Vitamin H88, folic acid  and TSH levels normal Reorient as needed. Use Haldol judiciously.  Non anion gap metabolic acidosis Serum bicarb is improving, 19 from 17, anion gap 8, 6, respectively Continue IV fluid and TPN Repeat BMP in the morning.  Hypertension: BP is not at goal IV antihypertensives as needed Continue to monitor vital signs  Hypokalemia, repleted Repleted intravenously  Hypophosphatemia Repleted intravenously  Leukocytosis WBC 12.8 from 10.8 Ongoing treatment for [name] Continue current management.  Paroxysmal A. fib with RVR -Not on anticoagulation as an outpatient.  -Rate better controlled currently.  Cardiology signed off on 01/01/2021.  Continue oral metoprolol if able to tolerate.  Use as needed IV metoprolol. -Echo as below shows EF of 50 to 55%  CAD status post PCI -Currently stable.  No chest pain.  Continue aspirin, metoprolol, simvastatin.  Outpatient follow-up with cardiology  COPD Acute hypoxic respiratory failure -Improved.  Intermittently requiring 2 L oxygen by nasal cannula. -DC Solu-Medrol.  Continue nebs and start Pulmicort.  Refractory hypokalemia Still on supplemental potassium with IV fluids. Continue replacement  Hypernatremia -Resolved.  Continue gentle hydration with D5.  Chronic kidney disease stage IIIa -Stable.  Monitor  Hyperlipidemia: -Continue simvastatin if able to take orally.  BPH: -Continue tamsulosin able to take orally.  Generalized deconditioning -will need PT eval once more stable. -Palliative care following.  Patient has worsening colonic distention with possible toxic megacolon on top of chronic cholecystitis and I doubt that patient is any kind of surgical candidate; with intermittent delirium and agitation, his overall prognosis looks very poor at this time.   -If condition does not improve, recommend hospice/comfort  measures  Goals of care Appreciate palliative care team's assistance. DNR. Family meeting planned with palliative care team at 3:30 PM to discuss goals of care.  DVT prophylaxis: Subcutaneous heparin 3 times daily Code Status: DNR Family Communication:  None at bedside. Disposition Plan: Status is: Inpatient  Remains inpatient appropriate because:Inpatient level of care appropriate due to severity of illness   Dispo: The patient is from: Home  Anticipated d/c is to: Undetermined at the time of this visit.  Patient currently is not medically stable to d/c.              Difficult to place patient No  Consultants: General surgery/cardiology/palliative care  Procedures: Echo  Antimicrobials:             Anti-infectives (From admission, onward)     Start     Dose/Rate Route Frequency Ordered Stop   01/02/21 1330  vancomycin (VANCOCIN) 500 mg in sodium chloride irrigation 0.9 % 100 mL ENEMA        500 mg Rectal Every 6 hours 01/02/21 1234 01/16/21 1159   12/30/20 1200  vancomycin (VANCOCIN) 50 mg/mL oral solution 500 mg  Status:  Discontinued        500 mg Oral Every 6 hours 12/30/20 0956 01/02/21 1234   12/30/20 1200  metroNIDAZOLE (FLAGYL) IVPB 500 mg        500 mg 100 mL/hr over 60 Minutes Intravenous Every 8 hours 12/30/20 0956     12/29/20 0600  vancomycin (VANCOREADY) IVPB 1250 mg/250 mL  Status:  Discontinued        1,250 mg 166.7 mL/hr over 90 Minutes Intravenous Every 36 hours 12/27/20 2315 12/28/20 1007   12/28/20 2000  vancomycin (VANCOCIN) 50 mg/mL oral solution 125 mg  Status:  Discontinued        125 mg Oral Every 6 hours 12/28/20 1801 12/30/20 0956  12/28/20 0830  clindamycin (CLEOCIN) IVPB 900 mg  Status:  Discontinued       "And" Linked Group Details   900 mg 100 mL/hr over 30 Minutes Intravenous On call to O.R. 12/28/20 0539 12/29/20 0559   12/28/20 0830  gentamicin (GARAMYCIN) 360 mg in dextrose 5 % 100 mL IVPB   Status:  Discontinued       "And" Linked Group Details   5 mg/kg  71.2 kg 109 mL/hr over 60 Minutes Intravenous On call to O.R. 12/28/20 7673 12/29/20 0559   12/28/20 0600  ceFEPIme (MAXIPIME) 2 g in sodium chloride 0.9 % 100 mL IVPB  Status:  Discontinued        2 g 200 mL/hr over 30 Minutes Intravenous Every 12 hours 12/27/20 2315 12/29/20 0955   12/28/20 0200  metroNIDAZOLE (FLAGYL) IVPB 500 mg  Status:  Discontinued        500 mg 100 mL/hr over 60 Minutes Intravenous Every 8 hours 12/27/20 2202 12/29/20 0955   12/27/20 1830  aztreonam (AZACTAM) 2 g in sodium chloride 0.9 % 100 mL IVPB  Status:  Discontinued        2 g 200 mL/hr over 30 Minutes Intravenous  Once 12/27/20 1820 12/27/20 1823   12/27/20 1830  metroNIDAZOLE (FLAGYL) IVPB 500 mg        500 mg 100 mL/hr over 60 Minutes Intravenous  Once 12/27/20 1820 12/27/20 1935   12/27/20 1830  vancomycin (VANCOCIN) IVPB 1000 mg/200 mL premix        1,000 mg 200 mL/hr over 60 Minutes Intravenous  Once 12/27/20 1820 12/27/20 1934   12/27/20 1830  ceFEPIme (MAXIPIME) 2 g in sodium chloride 0.9 % 100 mL IVPB        2 g 200 mL/hr over 30 Minutes Intravenous  Once 12/27/20 1824 12/27/20 1903           Objective: Vitals:   01/06/21 1000 01/06/21 1100 01/06/21 1125 01/06/21 1200  BP: (!) 195/98   133/69  Pulse: 82 88 80 90  Resp: 19 (!) 23 (!) 21 19  Temp:    97.6 F (36.4 C)  TempSrc:    Axillary  SpO2:   100% 97%  Weight:      Height:        Intake/Output Summary (Last 24 hours) at 01/06/2021 1355 Last data filed at 01/06/2021 1258 Gross per 24 hour  Intake 3112.32 ml  Output 1700 ml  Net 1412.32 ml   Filed Weights   01/02/21 0308 01/03/21 0438 01/04/21 0349  Weight: 71.3 kg 72.2 kg 74.3 kg    Exam:  . General: 87 y.o. year-old male frail-appearing lethargic after Haldol and Ativan.   . Cardiovascular: Regular rate and rhythm no rubs gallops.   Marland Kitchen Respiratory: Clear to auscultation no wheezes or  rales.  Poor inspiratory effort.   . Abdomen: Distended.  Nontender with palpation. . Musculoskeletal: No lower extremity edema. . Skin: No ulcerative lesions noted.   Marland Kitchen Psychiatry: Unable to assess mood due to lethargy.  Data Reviewed: CBC: Recent Labs  Lab 01/02/21 1103 01/03/21 0240 01/04/21 0242 01/05/21 0240 01/06/21 0250  WBC 8.6 8.7 10.8* 12.8* 11.1*  NEUTROABS 7.3 7.3 8.8* 10.4* 9.0*  HGB 10.6* 10.1* 10.5* 10.6* 10.6*  HCT 33.5* 32.0* 33.2* 32.5* 33.3*  MCV 89.6 89.6 88.5 87.1 87.6  PLT 265 245 248 250 419   Basic Metabolic Panel: Recent Labs  Lab 01/02/21 1103 01/02/21 1906 01/03/21 0240 01/03/21 1909 01/04/21 0242 01/05/21  0240 01/06/21 0250  NA 141  --  140  --  139 138 140  K 2.5* 2.8* 2.7* 3.6 3.5 2.9* 2.8*  CL 115*  --  115*  --  114* 115* 113*  CO2 22  --  17*  --  19* 17* 19*  GLUCOSE 246*  --  219*  --  187* 150* 138*  BUN 42*  --  40*  --  36* 31* 23  CREATININE 1.02  --  1.07  --  1.07 0.77 0.74  CALCIUM 8.4*  --  8.3*  --  8.5* 8.2* 8.2*  MG 2.0 2.1 2.1  --  2.2 1.9 1.8  PHOS  --   --   --   --   --   --  2.1*   GFR: Estimated Creatinine Clearance: 60.8 mL/min (by C-G formula based on SCr of 0.74 mg/dL). Liver Function Tests: Recent Labs  Lab 01/02/21 1103 01/03/21 0240 01/04/21 0242 01/05/21 0240 01/06/21 0250  AST 45* 35 27 23 23   ALT 54* 48* 41 37 34  ALKPHOS 55 50 47 41 39  BILITOT 0.6 0.5 0.8 0.4 0.5  PROT 6.1* 5.3* 5.4* 5.0* 5.1*  ALBUMIN 3.1* 2.9* 2.9* 2.6* 2.6*   No results for input(s): LIPASE, AMYLASE in the last 168 hours. No results for input(s): AMMONIA in the last 168 hours. Coagulation Profile: No results for input(s): INR, PROTIME in the last 168 hours. Cardiac Enzymes: No results for input(s): CKTOTAL, CKMB, CKMBINDEX, TROPONINI in the last 168 hours. BNP (last 3 results) No results for input(s): PROBNP in the last 8760 hours. HbA1C: No results for input(s): HGBA1C in the last 72 hours. CBG: Recent Labs  Lab  01/06/21 0409 01/06/21 0815 01/06/21 1226  GLUCAP 147* 124* 179*   Lipid Profile: Recent Labs    01/06/21 0250  TRIG 85   Thyroid Function Tests: No results for input(s): TSH, T4TOTAL, FREET4, T3FREE, THYROIDAB in the last 72 hours. Anemia Panel: No results for input(s): VITAMINB12, FOLATE, FERRITIN, TIBC, IRON, RETICCTPCT in the last 72 hours. Urine analysis:    Component Value Date/Time   COLORURINE AMBER (A) 12/27/2020 2214   APPEARANCEUR HAZY (A) 12/27/2020 2214   APPEARANCEUR Clear 04/29/2013 1543   LABSPEC 1.039 (H) 12/27/2020 2214   LABSPEC 1.020 04/29/2013 1543   PHURINE 5.0 12/27/2020 2214   GLUCOSEU NEGATIVE 12/27/2020 2214   GLUCOSEU Negative 04/29/2013 1543   HGBUR SMALL (A) 12/27/2020 2214   BILIRUBINUR NEGATIVE 12/27/2020 2214   BILIRUBINUR + 07/25/2018 1304   BILIRUBINUR Negative 04/29/2013 1543   KETONESUR 5 (A) 12/27/2020 2214   PROTEINUR >=300 (A) 12/27/2020 2214   UROBILINOGEN 0.2 07/25/2018 1304   UROBILINOGEN 0.2 03/08/2012 1624   NITRITE NEGATIVE 12/27/2020 2214   LEUKOCYTESUR NEGATIVE 12/27/2020 2214   LEUKOCYTESUR Negative 04/29/2013 1543   Sepsis Labs: @LABRCNTIP (procalcitonin:4,lacticidven:4)  ) Recent Results (from the past 240 hour(s))  Blood Culture (routine x 2)     Status: None   Collection Time: 12/27/20  5:52 PM   Specimen: BLOOD  Result Value Ref Range Status   Specimen Description   Final    BLOOD SITE NOT SPECIFIED Performed at [facility] [address], [place], [place] [address]    Special Requests   Final    BOTTLES DRAWN AEROBIC AND ANAEROBIC Blood Culture adequate volume Performed at [facility] [address] [place], [place] [address]    Culture   Final    NO GROWTH 5 DAYS  Performed at [facility], [place] [address] [place], [place] [address]    Report Status 01/01/2021 FINAL  Final  Blood Culture (routine x 2)     Status: None   Collection Time: 12/27/20  6:19 PM   Specimen:  BLOOD  Result Value Ref Range Status   Specimen Description   Final    BLOOD LEFT ANTECUBITAL Performed at [facility] [address], [place], [place] [address]    Special Requests   Final    BOTTLES DRAWN AEROBIC AND ANAEROBIC Blood Culture results may not be optimal due to an inadequate volume of blood received in culture bottles Performed at [facility] [address], [place], [place] [address]    Culture   Final    NO GROWTH 5 DAYS Performed at [facility], [place] [address] [place], [place] [address]    Report Status 01/01/2021 FINAL  Final  Resp Panel by RT-PCR (Flu A&B, Covid) Nasopharyngeal Swab     Status: None   Collection Time: 12/27/20  6:21 PM   Specimen: Nasopharyngeal Swab; Nasopharyngeal(NP) swabs in vial transport medium  Result Value Ref Range Status   SARS Coronavirus 2 by RT PCR NEGATIVE NEGATIVE Final    Comment: (NOTE) SARS-CoV-2 target nucleic acids are NOT DETECTED.  The SARS-CoV-2 RNA is generally detectable in upper respiratory specimens during the acute phase of infection. The lowest concentration of SARS-CoV-2 viral copies this assay can detect is 138 copies/mL. A negative result does not preclude SARS-Cov-2 infection and should not be used as the sole basis for treatment or other patient management decisions. A negative result may occur with  improper specimen collection/handling, submission of specimen other than nasopharyngeal swab, presence of viral mutation(s) within the areas targeted by this assay, and inadequate number of viral copies(<138 copies/mL). A negative result must be combined with clinical observations, patient history, and epidemiological information. The expected result is Negative.  Fact Sheet for Patients:  EntrepreneurPulse.com.au  Fact Sheet for Healthcare Providers:  IncredibleEmployment.be  This test is no t yet approved or cleared by the  [place] FDA and  has been authorized for detection and/or diagnosis of SARS-CoV-2 by FDA under an Emergency Use Authorization (EUA). This EUA will remain  in effect (meaning this test can be used) for the duration of the COVID-19 declaration under Section 564(b)(1) of the Act, 21 U.S.C.section 360bbb-3(b)(1), unless the authorization is terminated  or revoked sooner.       Influenza A by PCR NEGATIVE NEGATIVE Final   Influenza B by PCR NEGATIVE NEGATIVE Final    Comment: (NOTE) The Xpert Xpress SARS-CoV-2/FLU/RSV plus assay is intended as an aid in the diagnosis of influenza from Nasopharyngeal swab specimens and should not be used as a sole basis for treatment. Nasal washings and aspirates are unacceptable for Xpert Xpress SARS-CoV-2/FLU/RSV testing.  Fact Sheet for Patients: EntrepreneurPulse.com.au  Fact Sheet for Healthcare Providers: IncredibleEmployment.be  This test is not yet approved or cleared by the [place] FDA and has been authorized for detection and/or diagnosis of SARS-CoV-2 by FDA under an Emergency Use Authorization (EUA). This EUA will remain in effect (meaning this test can be used) for the duration of the COVID-19 declaration under Section 564(b)(1) of the Act, 21 U.S.C. section 360bbb-3(b)(1), unless the authorization is terminated or revoked.  Performed at [facility], [place] [address] [place], [place] [address]   Urine culture     Status: Abnormal   Collection  Time: 12/27/20 10:14 PM   Specimen: In/Out Cath Urine  Result Value Ref Range Status   Specimen Description   Final    IN/OUT CATH URINE Performed at [facility], [place] [address] [place], [place] [address]    Special Requests   Final    NONE Performed at [facility], [place] [address] [place], [place] [address]    Culture MULTIPLE SPECIES PRESENT, SUGGEST RECOLLECTION (A)  Final   Report  Status 12/29/2020 FINAL  Final  MRSA PCR Screening     Status: Abnormal   Collection Time: 12/28/20  8:46 AM   Specimen: Nasal Mucosa; Nasopharyngeal  Result Value Ref Range Status   MRSA by PCR POSITIVE (A) NEGATIVE Final    Comment:        The GeneXpert MRSA Assay (FDA approved for NASAL specimens only), is one component of a comprehensive MRSA colonization surveillance program. It is not intended to diagnose MRSA infection nor to guide or monitor treatment for MRSA infections. RESULT CALLED TO, READ BACK BY AND VERIFIED WITH: Leida Lauth RN AT 1112 12/28/20 MULLINS,T Performed at [facility], [place] [address], [place], [place] [address]   C Difficile Quick Screen w PCR reflex     Status: Abnormal   Collection Time: 12/28/20 11:43 AM   Specimen: STOOL  Result Value Ref Range Status   C Diff antigen POSITIVE (A) NEGATIVE Final   C Diff toxin POSITIVE (A) NEGATIVE Final   C Diff interpretation Toxin producing C. difficile detected.  Final    Comment: CRITICAL RESULT CALLED TO, READ BACK BY AND VERIFIED WITH: L.CAUDLE, RN AT 1757 ON 05.18.22 BY N.THOMPSON Performed at [facility] [address] [place], [place] [address]   Gastrointestinal Panel by PCR , Stool     Status: None   Collection Time: 12/28/20 11:43 AM   Specimen: STOOL  Result Value Ref Range Status   Campylobacter species NOT DETECTED NOT DETECTED Final   Plesimonas shigelloides NOT DETECTED NOT DETECTED Final   Salmonella species NOT DETECTED NOT DETECTED Final   Yersinia enterocolitica NOT DETECTED NOT DETECTED Final   Vibrio species NOT DETECTED NOT DETECTED Final   Vibrio cholerae NOT DETECTED NOT DETECTED Final   Enteroaggregative E coli (EAEC) NOT DETECTED NOT DETECTED Final   Enteropathogenic E coli (EPEC) NOT DETECTED NOT DETECTED Final   Enterotoxigenic E coli (ETEC) NOT DETECTED NOT DETECTED Final   Shiga like toxin producing E coli (STEC) NOT DETECTED NOT  DETECTED Final   Shigella/Enteroinvasive E coli (EIEC) NOT DETECTED NOT DETECTED Final   Cryptosporidium NOT DETECTED NOT DETECTED Final   Cyclospora c[place]etanensis NOT DETECTED NOT DETECTED Final   Entamoeba histolytica NOT DETECTED NOT DETECTED Final   Giardia lamblia NOT DETECTED NOT DETECTED Final   Adenovirus F40/41 NOT DETECTED NOT DETECTED Final   Astrovirus NOT DETECTED NOT DETECTED Final   Norovirus GI/GII NOT DETECTED NOT DETECTED Final   Rotavirus A NOT DETECTED NOT DETECTED Final   Sapovirus (I, II, IV, and V) NOT DETECTED NOT DETECTED Final    Comment: Performed at [facility], [place] [place], [place] [address]      Studies: DG Chest Port 1 View  Result Date: 01/06/2021 CLINICAL DATA:  Status post right upper extremity PICC placement EXAM: PORTABLE CHEST 1 VIEW COMPARISON:  12/31/2020 FINDINGS: Interval placement of right upper extremity PICC, tip projecting over the lower SVC. Cardiomegaly. No acute abnormality of the lungs. IMPRESSION: 1. Interval  placement of right upper extremity PICC, tip projecting over the lower SVC. 2. Cardiomegaly. Electronically Signed   By: Eddie Candle M.D.   On: 01/06/2021 10:08   DG Abd Portable 1V  Result Date: 01/06/2021 CLINICAL DATA:  [name] EXAM: PORTABLE ABDOMEN - 1 VIEW COMPARISON:  Portable exam at 0810 hrs compared to 01/05/2021 FINDINGS: Mild diffuse gaseous distention of colon. No bowel wall thickening. Small bowel gas pattern normal. Osseous structures normal. No urinary tract calcifications. IMPRESSION: Mild gaseous distention of colon without definite wall thickening. Electronically Signed   By: Lavonia Dana M.D.   On: 01/06/2021 10:17   [place] EKG SITE RITE  Result Date: 01/05/2021 If Site Rite image not attached, placement could not be confirmed due to current cardiac rhythm.   Scheduled Meds: . aspirin EC  81 mg Oral Daily  . budesonide (PULMICORT) nebulizer solution  0.5 mg Nebulization BID  .  Chlorhexidine Gluconate Cloth  6 each Topical Daily  . heparin  5,000 Units Subcutaneous Q8H  . insulin aspart  0-9 Units Subcutaneous Q6H  . ipratropium-albuterol  3 mL Nebulization BID  . mouth rinse  15 mL Mouth Rinse BID  . methylPREDNISolone (SOLU-MEDROL) injection  40 mg Intravenous Q24H  . metoprolol tartrate  25 mg Oral Q6H  . pantoprazole  40 mg Oral Daily  . simvastatin  40 mg Oral QPM  . sodium chloride flush  10-40 mL Intracatheter Q12H  . sodium chloride flush  3 mL Intravenous Q12H  . tamsulosin  0.4 mg Oral Daily  . vancomycin  500 mg Oral Q6H  . vancomycin (VANCOCIN) rectal ENEMA  500 mg Rectal Q6H    Continuous Infusions: . dextrose 5 % with kcl 75 mL/hr at 01/06/21 1258  . dextrose 5 % with kcl    . metronidazole Stopped (01/06/21 1235)  . potassium PHOSPHATE IVPB (in mmol) 85 mL/hr at 01/06/21 1258  . TPN ADULT (ION)       LOS: 10 days     Kayleen Memos, MD [facility] Pager [phone]  If 7PM-7AM, please contact night-coverage [email or web address] Password [email or web address] 01/06/2021, 1:55 PM

## 2021-01-06 NOTE — Progress Notes (Signed)
Progress Note  8 Days Post-Op  Subjective: CC: afebrile, tachycardic this am. Somnolent this am and does not awaken easily. Nursing notes he awakened this morning and there has bee no reported emesis  Objective: Vital signs in last 24 hours: Temp:  [98 F (36.7 C)-98.7 F (37.1 C)] 98.7 F (37.1 C) (05/27 0400) Pulse Rate:  [64-213] 74 (05/27 0705) Resp:  [14-27] 18 (05/27 0705) BP: (127-203)/(61-149) 146/90 (05/27 0645) SpO2:  [95 %-100 %] 100 % (05/27 0025) FiO2 (%):  [21 %-96 %] 21 % (05/26 1916) Last BM Date: 01/05/21  Intake/Output from previous day: 05/26 0701 - 05/27 0700 In: 2057.1 [I.V.:1725; IV Piggyback:332.1] Out: 1700 [Urine:1700] Intake/Output this shift: No intake/output data recorded.  PE: General: frail, ill appearing, male who is laying in bed in NAD. No restraints HEENT: head is normocephalic, atraumatic.  Edentulous Heart: regular, rate, and rhythm.  Palpable radial and pedal pulses bilaterally Lungs: Respiratory effort nonlabored on room air Abd: very firmly distended. +BS. No guarding or withdrawal from abdominal palpation MS: all 4 extremities are symmetrical with no cyanosis, clubbing, or edema. Skin: warm and dry  Psych/Neuro: somnolent  Lab Results:  Recent Labs    01/05/21 0240 01/06/21 0250  WBC 12.8* 11.1*  HGB 10.6* 10.6*  HCT 32.5* 33.3*  PLT 250 230   BMET Recent Labs    01/05/21 0240 01/06/21 0250  NA 138 140  K 2.9* 2.8*  CL 115* 113*  CO2 17* 19*  GLUCOSE 150* 138*  BUN 31* 23  CREATININE 0.77 0.74  CALCIUM 8.2* 8.2*   PT/INR No results for input(s): LABPROT, INR in the last 72 hours. CMP     Component Value Date/Time   NA 140 01/06/2021 0250   NA 135 (L) 04/29/2013 1543   K 2.8 (L) 01/06/2021 0250   K 3.3 (L) 04/29/2013 1543   CL 113 (H) 01/06/2021 0250   CL 102 04/29/2013 1543   CO2 19 (L) 01/06/2021 0250   CO2 27 04/29/2013 1543   GLUCOSE 138 (H) 01/06/2021 0250   GLUCOSE 120 (H) 04/29/2013 1543   BUN  23 01/06/2021 0250   BUN 17 04/29/2013 1543   CREATININE 0.74 01/06/2021 0250   CREATININE 2.47 (H) 09/09/2020 1343   CALCIUM 8.2 (L) 01/06/2021 0250   CALCIUM 9.8 04/29/2013 1543   PROT 5.1 (L) 01/06/2021 0250   PROT 7.9 04/29/2013 1543   ALBUMIN 2.6 (L) 01/06/2021 0250   ALBUMIN 3.8 04/29/2013 1543   AST 23 01/06/2021 0250   AST 30 04/29/2013 1543   ALT 34 01/06/2021 0250   ALT 24 04/29/2013 1543   ALKPHOS 39 01/06/2021 0250   ALKPHOS 79 04/29/2013 1543   BILITOT 0.5 01/06/2021 0250   BILITOT 0.4 04/29/2013 1543   GFRNONAA >60 01/06/2021 0250   GFRNONAA >60 04/29/2013 1543   GFRAA 42 (L) 05/04/2017 1945   GFRAA >60 04/29/2013 1543   Lipase     Component Value Date/Time   LIPASE 22 12/27/2020 1752   LIPASE 154 04/29/2013 1543       Studies/Results: DG Abd Portable 1V  Result Date: 01/05/2021 CLINICAL DATA:  Abdominal distension. EXAM: PORTABLE ABDOMEN - 1 VIEW COMPARISON:  Jan 04, 2021. FINDINGS: Stable gaseous distention of the colon is noted with the proximal transverse colon measuring 14 cm in diameter, which is not changed compared to prior exam. No definite small bowel dilatation is noted. IMPRESSION: Stable gaseous distention of the colon is noted as described above. Electronically Signed  By: Marijo Conception M.D.   On: 01/05/2021 12:54   [place] EKG SITE RITE  Result Date: 01/05/2021 If Site Rite image not attached, placement could not be confirmed due to current cardiac rhythm.   Anti-infectives: Anti-infectives (From admission, onward)   Start     Dose/Rate Route Frequency Ordered Stop   01/06/21 0000  vancomycin (VANCOCIN) 50 mg/mL oral solution 125 mg        125 mg Oral Every 6 hours 01/05/21 2038     01/02/21 1330  vancomycin (VANCOCIN) 500 mg in sodium chloride irrigation 0.9 % 100 mL ENEMA        500 mg Rectal Every 6 hours 01/02/21 1234 01/16/21 1159   12/30/20 1200  vancomycin (VANCOCIN) 50 mg/mL oral solution 500 mg  Status:  Discontinued        500 mg  Oral Every 6 hours 12/30/20 0956 01/02/21 1234   12/30/20 1200  metroNIDAZOLE (FLAGYL) IVPB 500 mg        500 mg 100 mL/hr over 60 Minutes Intravenous Every 8 hours 12/30/20 0956     12/29/20 0600  vancomycin (VANCOREADY) IVPB 1250 mg/250 mL  Status:  Discontinued        1,250 mg 166.7 mL/hr over 90 Minutes Intravenous Every 36 hours 12/27/20 2315 12/28/20 1007   12/28/20 2000  vancomycin (VANCOCIN) 50 mg/mL oral solution 125 mg  Status:  Discontinued        125 mg Oral Every 6 hours 12/28/20 1801 12/30/20 0956   12/28/20 0830  clindamycin (CLEOCIN) IVPB 900 mg  Status:  Discontinued       "And" Linked Group Details   900 mg 100 mL/hr over 30 Minutes Intravenous On call to O.R. 12/28/20 3500 12/29/20 0559   12/28/20 0830  gentamicin (GARAMYCIN) 360 mg in dextrose 5 % 100 mL IVPB  Status:  Discontinued       "And" Linked Group Details   5 mg/kg  71.2 kg 109 mL/hr over 60 Minutes Intravenous On call to O.R. 12/28/20 9381 12/29/20 0559   12/28/20 0600  ceFEPIme (MAXIPIME) 2 g in sodium chloride 0.9 % 100 mL IVPB  Status:  Discontinued        2 g 200 mL/hr over 30 Minutes Intravenous Every 12 hours 12/27/20 2315 12/29/20 0955   12/28/20 0200  metroNIDAZOLE (FLAGYL) IVPB 500 mg  Status:  Discontinued        500 mg 100 mL/hr over 60 Minutes Intravenous Every 8 hours 12/27/20 2202 12/29/20 0955   12/27/20 1830  aztreonam (AZACTAM) 2 g in sodium chloride 0.9 % 100 mL IVPB  Status:  Discontinued        2 g 200 mL/hr over 30 Minutes Intravenous  Once 12/27/20 1820 12/27/20 1823   12/27/20 1830  metroNIDAZOLE (FLAGYL) IVPB 500 mg        500 mg 100 mL/hr over 60 Minutes Intravenous  Once 12/27/20 1820 12/27/20 1935   12/27/20 1830  vancomycin (VANCOCIN) IVPB 1000 mg/200 mL premix        1,000 mg 200 mL/hr over 60 Minutes Intravenous  Once 12/27/20 1820 12/27/20 1934   12/27/20 1830  ceFEPIme (MAXIPIME) 2 g in sodium chloride 0.9 % 100 mL IVPB        2 g 200 mL/hr over 30 Minutes Intravenous   Once 12/27/20 1824 12/27/20 1903       Assessment/Plan CAD status post stenting to RCA in 1997 COPD CKD stage III- Cr 1.34  Hypertension Hyperlipidemia  BPH  Cholelithiasis Chronic cholecystitis- follow up with Dr. Redmond Pulling as outpatient as C diff source of infection this admit. - postpone lap chole until outpatient  C. Diff colitis with concern for toxic megacolon - NPO - CT 5/23 shows colon diffusely distended concerning for toxic megacolon and he is mildly diffusely tender on exam - abd xray 5/25 with progressive colonic distension - 14cm in greatest caliber. No pneumatosis or gross free intraperitoneal gas - repeat abd xray 5/26 - stable. 5/27 abd xray pending - lactic acid normal 5/26 - 1.1  - vancomycin enemas started 5/23. No longer available  - continue PO vancomycin with preference for enemas should they become available again  - at this time family is not leaning towards surgical management should it become indicated but do not want to completely eliminate the option at this time  FEN: NPO, IVF, TPN to start this pm VTE: SQ heparin - would not start heparin gtt yet ID: vanc 5/17; Cefepime/flagyl 5/17>5/19, gent/clinda 5/18; PO VANC 5/19>5/23,  flagyl 5/17 >>, Vanc enemas 5/23 >>     LOS: 10 days    Winferd Humphrey, [facility] Surgery 01/06/2021, 7:44 AM Please see Amion for pager number during day hours 7:00am-4:30pm

## 2021-01-06 NOTE — Progress Notes (Signed)
Nutrition Follow-up  DOCUMENTATION CODES:   Not applicable  INTERVENTION:  - TPN initiation and management per Pharmacist. - Monitor magnesium, potassium, and phosphorus daily for at least 3 days, MD to replete as needed, as pt is at risk for refeeding syndrome given inadequate nutrition since admission, current hypokalemia and hypophosphatemia.    NUTRITION DIAGNOSIS:   Increased nutrient needs related to acute illness,post-op healing as evidenced by estimated needs. -ongoing  GOAL:   Patient will meet greater than or equal to 90% of their needs -unmet/unable to meet at this time  MONITOR:   Diet advancement,Labs,Weight trends,Other (Comment) (TPN regimen)  REASON FOR ASSESSMENT:   Consult New TPN/TNA  ASSESSMENT:   87 year old male with medical history of CAD s/p stenting in 1997, COPD, stage 3 CKD, HTN, HLD, and BPH. He was admitted to [facility] 4/16-4/19 for cholecystitis, cystic duct obstruction with plan for lap chole on 5/31. He presented to the ED on 5/17 with fever and generalized weakness. CT abdomen/pelvis showed prostatomegaly.  Patient has been NPO since 5/23. Double lumen PICC placed in R brachial earlier this AM. Plan to start TPN this evening. Estimated nutrition needs from assessment on 5/25 remain appropriate at this time.   He has not been weighed since 5/25. No information documented in the edema section of flow sheet.   Per notes: - cholelithiasis with chronic cholecystitis  - Cdiff colitis with concern for toxic megacolon pending repeat abdominal xray today      Labs reviewed; CBGs: 147, 124 mg/dl, K: 2.8 mmol/l, Cl: 113 mmol/l, Ca: 8.2 mg/dl, Phos: 2.1 mg/dl Medications reviewed; 40 mg oral protonix/day, 10 mEq IV KCl x3 runs 5/27, 20 mmol IV KPhos x1 run 5/27. IVF; D5-40 mEq IV KCl @ 75 ml/hr (306 kcal/24 hrs)   Diet Order:   Diet Order            Diet NPO time specified  Diet effective now                 EDUCATION NEEDS:   Not  appropriate for education at this time  Skin:  Skin Assessment: Reviewed RN Assessment  Last BM:  5/27 (type 7 x2)  Height:   Ht Readings from Last 1 Encounters:  12/27/20 5\' 7"  (1.702 m)    Weight:   Wt Readings from Last 1 Encounters:  01/04/21 74.3 kg    Estimated Nutritional Needs:  Kcal:  1600-1800 kcal Protein:  70-85 grams Fluid:  >/= 1.8 L/day     Jarome Matin, MS, RD, LDN, CNSC Inpatient Clinical Dietitian RD pager # available in AMION  After hours/weekend pager # available in [facility]

## 2021-01-06 NOTE — Progress Notes (Signed)
PT Cancellation Note  Patient Details Name: Leon Estes MRN: [id number] DOB: July 18, 1931   Cancelled Treatment:    Reason Eval/Treat Not Completed: Fatigue/lethargy limiting ability to participate, per RN, allow patient to rest at this time. Will check back another time.   Claretha Cooper 01/06/2021, 11:21 AM [place] Pager [phone] Office [phone]

## 2021-01-07 DIAGNOSIS — A0472 Enterocolitis due to Clostridium difficile, not specified as recurrent: Secondary | ICD-10-CM | POA: Diagnosis not present

## 2021-01-07 LAB — BASIC METABOLIC PANEL
Anion gap: 7 (ref 5–15)
Anion gap: 6 (ref 5–15)
Anion gap: 4 — ABNORMAL LOW (ref 5–15)
BUN: 19 mg/dL (ref 8–23)
BUN: 20 mg/dL (ref 8–23)
BUN: 20 mg/dL (ref 8–23)
CO2: 20 mmol/L — ABNORMAL LOW (ref 22–32)
CO2: 22 mmol/L (ref 22–32)
CO2: 22 mmol/L (ref 22–32)
Calcium: 8.1 mg/dL — ABNORMAL LOW (ref 8.9–10.3)
Calcium: 8.1 mg/dL — ABNORMAL LOW (ref 8.9–10.3)
Calcium: 7.8 mg/dL — ABNORMAL LOW (ref 8.9–10.3)
Chloride: 112 mmol/L — ABNORMAL HIGH (ref 98–111)
Chloride: 110 mmol/L (ref 98–111)
Chloride: 112 mmol/L — ABNORMAL HIGH (ref 98–111)
Creatinine, Ser: 0.69 mg/dL (ref 0.61–1.24)
Creatinine, Ser: 0.68 mg/dL (ref 0.61–1.24)
Creatinine, Ser: 0.83 mg/dL (ref 0.61–1.24)
GFR, Estimated: >60 mL/min (ref >60)
GFR, Estimated: >60 mL/min (ref >60)
GFR, Estimated: >60 mL/min (ref >60)
Glucose, Bld: 144 mg/dL — ABNORMAL HIGH (ref 70–99)
Glucose, Bld: 149 mg/dL — ABNORMAL HIGH (ref 70–99)
Glucose, Bld: 127 mg/dL — ABNORMAL HIGH (ref 70–99)
Potassium: 2.9 mmol/L — ABNORMAL LOW (ref 3.5–5.1)
Potassium: 2.8 mmol/L — ABNORMAL LOW (ref 3.5–5.1)
Potassium: 3.2 mmol/L — ABNORMAL LOW (ref 3.5–5.1)
Sodium: 139 mmol/L (ref 135–145)
Sodium: 138 mmol/L (ref 135–145)
Sodium: 138 mmol/L (ref 135–145)

## 2021-01-07 LAB — CBC
HCT: 31.7 % — ABNORMAL LOW (ref 39.0–52.0)
Hemoglobin: 10.5 g/dL — ABNORMAL LOW (ref 13.0–17.0)
MCH: 28.4 pg (ref 26.0–34.0)
MCHC: 33.1 g/dL (ref 30.0–36.0)
MCV: 85.7 fL (ref 80.0–100.0)
Platelets: 232 10*3/uL (ref 150–400)
RBC: 3.7 MIL/uL — ABNORMAL LOW (ref 4.22–5.81)
RDW: 15 % (ref 11.5–15.5)
WBC: 16.7 10*3/uL — ABNORMAL HIGH (ref 4.0–10.5)
nRBC: 0 % (ref 0.0–0.2)

## 2021-01-07 LAB — GLUCOSE, CAPILLARY
Glucose-Capillary: 106 mg/dL — ABNORMAL HIGH (ref 70–99)
Glucose-Capillary: 134 mg/dL — ABNORMAL HIGH (ref 70–99)
Glucose-Capillary: 141 mg/dL — ABNORMAL HIGH (ref 70–99)
Glucose-Capillary: 143 mg/dL — ABNORMAL HIGH (ref 70–99)

## 2021-01-07 LAB — MAGNESIUM
Magnesium: 1.8 mg/dL (ref 1.7–2.4)
Magnesium: 1.9 mg/dL (ref 1.7–2.4)

## 2021-01-07 LAB — PHOSPHORUS
Phosphorus: 2.4 mg/dL — ABNORMAL LOW (ref 2.5–4.6)
Phosphorus: 2.4 mg/dL — ABNORMAL LOW (ref 2.5–4.6)

## 2021-01-07 LAB — HEMOGLOBIN A1C
Hgb A1c MFr Bld: 6.5 % — ABNORMAL HIGH (ref 4.8–5.6)
Mean Plasma Glucose: 140 mg/dL

## 2021-01-07 MED ORDER — SODIUM PHOSPHATES 45 MMOLE/15ML IV SOLN
20.0000 mmol | Freq: Once | INTRAVENOUS | Status: AC
  Administered 2021-01-07: 20 mmol via INTRAVENOUS
  Filled 2021-01-07: qty 6.67

## 2021-01-07 MED ORDER — POTASSIUM CHLORIDE 2 MEQ/ML IV SOLN
INTRAVENOUS | Status: AC
  Filled 2021-01-07: qty 1000

## 2021-01-07 MED ORDER — SODIUM CHLORIDE 0.9 % IV SOLN
INTRAVENOUS | Status: DC | PRN
  Administered 2021-01-07 – 2021-01-19 (×3): 250 mL via INTRAVENOUS

## 2021-01-07 MED ORDER — POTASSIUM CHLORIDE 10 MEQ/100ML IV SOLN
10.0000 meq | INTRAVENOUS | Status: AC
  Administered 2021-01-07 (×4): 10 meq via INTRAVENOUS
  Filled 2021-01-07 (×4): qty 100

## 2021-01-07 MED ORDER — TRACE MINERALS CU-MN-SE-ZN 300-55-60-3000 MCG/ML IV SOLN
INTRAVENOUS | Status: AC
  Filled 2021-01-07: qty 648

## 2021-01-07 MED ORDER — POTASSIUM CHLORIDE 10 MEQ/50ML IV SOLN
10.0000 meq | INTRAVENOUS | Status: AC
  Administered 2021-01-07 (×2): 10 meq via INTRAVENOUS
  Filled 2021-01-07 (×2): qty 50

## 2021-01-07 MED ORDER — POTASSIUM PHOSPHATES 15 MMOLE/5ML IV SOLN
20.0000 mmol | Freq: Once | INTRAVENOUS | Status: DC
  Filled 2021-01-07: qty 6.67

## 2021-01-07 NOTE — Progress Notes (Signed)
PHARMACY - TOTAL PARENTERAL NUTRITION CONSULT NOTE   Indication: Prolonged NPO, Cdiff  Patient Measurements: Height: 5\' 7"  (170.2 cm) Weight: 73.1 kg (161 lb 2.5 oz) IBW/kg (Calculated) : 66.1 TPN AdjBW (KG): 71.2 Body mass index is 25.24 kg/m. Usual Weight:   Assessment: 25 yoM admitted on 5/17 with fever and weakness.  PMH significant for planned outpatient lap cholecystectomy on 5/31, recent inpt admission at [facility] 4/16-4/19, CAD s/p stent, COPD, CKD, HTN, HLD, BPH.  Surgical intervention has been postponed d/t severe Cdiff and sepsis, concern for toxic megacolon.  He was briefly on CLD from 5/21-5/23, but has been otherwise NPO.    Glucose / Insulin: No hx DM or any DM medications.  Glucose 149 - SoluMedrol 125 mg x1 (5/20) > 60mg  q6h (5/21-5/22) > 40 MG Q8H (5/22-5/23) > 40 mg q12h (5/23-5/24) > 40 mg Q24h (5/25-5/27) - sensitive SSI:  6 units/ 24 hrs Electrolytes: K low at 2.8, Phos 2.4 low/improved, Mag 1.9, Cl elevated, CO2 low.  Others WNL.  CorrCa 9.3 Renal: SCr 0.68, BUN 20 Hepatic: LFTs and Tbili WNL Prealbumin: 26.5  Intake / Output; MIVF: D5 KCl 40 mEq/L at 40 ml/hr. - Net I/O: + 61mL; UOP 1.1 ml/kg/hr - Stool:  x3 occurrences yesterday GI Imaging: 5/23 CT colon is diffusely distended and fluid-filled to the rectum,concerning for toxic megacolon; trace ascites, anasarca; Prostatomegaly, CAD 5/26 AXR:  Stable, unchanged gaseous distention of the colon; No definite small bowel dilatation is noted. GI Surgeries / Procedures:   Central access: PICC 5/26 TPN start date: 5/27  Nutritional Goals (per RD recommendation on 5/25): Kcal:  1600-1800 kcal, Protein:  70-85 grams, Fluid:  >/= 1.8 L/day Goal TPN rate is 75 mL/hr (provides 81 g of protein and 1760 kcals per day)  Current Nutrition:  NPO  Plan:  Now: MD ordered KCl 10 mEq x4 doses, NaPhos 20 mmol IV x1 KCl x2 additional runs for total K replacement 60 mEq outside of TPN.  At 18:00: Advance TPN to 60 mL/hr at  1800  Electrolytes in TPN: Na 68mEq/L, K 49mEq/L, Ca 87mEq/L, Mg 22mEq/L, and Phos 56mmol/L. Cl:Ac - max Acetate.  - K in TPN provides additional 28mEq Add standard MVI and trace elements to TPN CBG and Sensitive SSI q6h  Reduce MIVF to 20 mL/hr at 1800 Monitor TPN labs on Mon/Thurs.   Gretta Arab PharmD, BCPS Clinical Pharmacist [facility] main pharmacy [phone] 01/07/2021,9:45 AM

## 2021-01-07 NOTE — Progress Notes (Signed)
IV Team consult placed to check PICC.  PICC infusing with out issue.  RUE without edema- possible very mild dependant edema present distal elbow region.  Pt states the right arm"has always been bigger than the left arm" "Because I use it so much".  Slight discoloration/ redness noted to skin axillary distally to fingers, pt also states is normal.  Strong radial pulses, color normal and temperature normal compared to LUE.  Pt denies pain or discomfort in arm or at PICC site.  Lauren RN notified of assessment WNL.

## 2021-01-07 NOTE — Evaluation (Signed)
Occupational Therapy Evaluation Patient Details Name: Leon Estes MRN: [id number] DOB: 06/20/1931 Today's Date: 01/07/2021    History of Present Illness 87 year old male with history of CAD status post stenting to RCA in 1997, COPD, CKD stage III, hypertension, hyperlipidemia, BPH, recent admission at [facility] from 11/26/2020-11/29/2020 with presumed acute cholecystitis ,presented on 12/27/2020 with fever and generalized weakness.Hospital course has been complicated by delirium/agitation along with A. fib with RVR, C Diff., abdominal distention with imaging showing concern for toxic megacolon   Clinical Impression   Pt reports being independent in mobility and ADL at baseline. He does not drive. Pt eager to work with therapies and tolerated OOB remarkably well for his long length of stay. Moderate assistance for bed mobility and to transfer to chair with RW using shuffling steps, second person for safety and lines. Pt requires set up to total assist for ADL. He demonstrates impaired cognition, but is pleasant and cooperative. He may progress well and not require post acute rehab in SNF, depending on progress.     Follow Up Recommendations  SNF    Equipment Recommendations  3 in 1 bedside commode    Recommendations for Other Services       Precautions / Restrictions Precautions Precautions: Fall Restrictions Weight Bearing Restrictions: No      Mobility Bed Mobility Overal bed mobility: Needs Assistance Bed Mobility: Supine to Sit     Supine to sit: Mod assist     General bed mobility comments: extra time, assist with trunk, pulled up on therapist's hand    Transfers Overall transfer level: Needs assistance Equipment used: Rolling walker (2 wheeled) Transfers: Sit to/from [facility] Sit to Stand: Mod assist;+2 safety/equipment Stand pivot transfers: Mod assist;+2 safety/equipment       General transfer comment: steady assistance to stand, able to take  small shuffle steps to recliner, assist for balance    Balance Overall balance assessment: Needs assistance Sitting-balance support: Bilateral upper extremity supported;Feet supported Sitting balance-Leahy Scale: Fair     Standing balance support: During functional activity;Bilateral upper extremity supported Standing balance-Leahy Scale: Poor Standing balance comment: reliant on Rw and steady support                           ADL either performed or assessed with clinical judgement   ADL Overall ADL's : Needs assistance/impaired Eating/Feeding: NPO   Grooming: Wash/dry hands;Wash/dry face;Sitting;Set up   Upper Body Bathing: Sitting;Moderate assistance   Lower Body Bathing: Sit to/from stand;Total assistance;+2 for physical assistance   Upper Body Dressing : Minimal assistance;Sitting   Lower Body Dressing: +2 for physical assistance;Total assistance;Sit to/from stand   Toilet Transfer: Moderate assistance;Stand-pivot;+2 for safety/equipment   Toileting- Clothing Manipulation and Hygiene: Total assistance;+2 for physical assistance;Sit to/from stand               Vision Baseline Vision/History: No visual deficits Patient Visual Report: No change from baseline       Perception     Praxis      Pertinent Vitals/Pain Pain Assessment: No/denies pain Faces Pain Scale: No hurt     Hand Dominance Right   Extremity/Trunk Assessment Upper Extremity Assessment Upper Extremity Assessment: Overall WFL for tasks assessed   Lower Extremity Assessment Lower Extremity Assessment: Defer to PT evaluation   Cervical / Trunk Assessment Cervical / Trunk Assessment: Kyphotic   Communication Communication Communication: HOH   Cognition Arousal/Alertness: Awake/alert Behavior During Therapy: WFL for tasks assessed/performed Overall Cognitive  Status: Impaired/Different from baseline Area of Impairment: Memory;Orientation                 Orientation  Level: Disoriented to;Situation   Memory: Decreased short-term memory         General Comments: generally oriented to [place], has been here 10 days,oriented to month and yr and near to date. Did state he's in hospital due to a mass.   General Comments       Exercises   Shoulder Instructions      Home Living Family/patient expects to be discharged to:: Private residence Living Arrangements: Spouse/significant other Available Help at Discharge: Family;Available 24 hours/day Type of Home: House Home Access: Stairs to enter [employer] of Steps: 2   Home Layout: Two level;Able to live on main level with bedroom/bathroom     Bathroom Shower/Tub: Walk-in shower         Home Equipment: Environmental consultant - 2 wheels;Cane - single point   Additional Comments: pt is a poor historian      Prior Functioning/Environment Level of Independence: Needs assistance  Gait / Transfers Assistance Needed: INDEP. Has walker and cane, but rarely uses. ADL's / Homemaking Assistance Needed: Pt is INDEP with self care. Spouse drives d/t pt with decreased vision.            OT Problem List: Decreased strength;Decreased activity tolerance;Impaired balance (sitting and/or standing);Decreased cognition;Decreased knowledge of use of DME or AE      OT Treatment/Interventions: Self-care/ADL training;DME and/or AE instruction;Therapeutic activities;Cognitive remediation/compensation;Patient/family education;Balance training    OT Goals(Current goals can be found in the care plan section) Acute Rehab OT Goals Patient Stated Goal: ready to get out of bed, go home OT Goal Formulation: With patient Time For Goal Achievement: 01/21/21 Potential to Achieve Goals: Good ADL Goals Pt Will Perform Grooming: with min assist;standing Pt Will Perform Upper Body Dressing: with supervision;sitting Pt Will Perform Lower Body Dressing: with mod assist;sit to/from stand Pt Will Transfer to Toilet: with min  assist;ambulating;bedside commode Pt Will Perform Toileting - Clothing Manipulation and hygiene: with min assist;sit to/from stand Additional ADL Goal #1: Pt will perform bed mobility with supervision in preparation for ADL.  OT Frequency: Min 2X/week   Barriers to D/C:            Co-evaluation PT/OT/SLP Co-Evaluation/Treatment: Yes Reason for Co-Treatment: Complexity of the patient's impairments (multi-system involvement)   OT goals addressed during session: ADL's and self-care      AM-PAC OT "6 Clicks" Daily Activity     Outcome Measure Help from another person eating meals?: A Little Help from another person taking care of personal grooming?: A Little Help from another person toileting, which includes using toliet, bedpan, or urinal?: Total Help from another person bathing (including washing, rinsing, drying)?: A Lot Help from another person to put on and taking off regular upper body clothing?: A Little Help from another person to put on and taking off regular lower body clothing?: Total 6 Click Score: 13   End of Session Equipment Utilized During Treatment: Gait belt;Rolling walker Nurse Communication: Mobility status  Activity Tolerance: Patient tolerated treatment well Patient left: in chair;with call bell/phone within reach;with chair alarm set  OT Visit Diagnosis: Unsteadiness on feet (R26.81);Other abnormalities of gait and mobility (R26.89);Muscle weakness (generalized) (M62.81);Other symptoms and signs involving cognitive function                Time: 3419-3790 OT Time Calculation (min): 26 min Charges:  OT General Charges $  OT Visit: 1 Visit OT Evaluation $OT Eval Moderate Complexity: 1 Mod  Nestor Lewandowsky, OTR/L Acute Rehabilitation Services Pager: [phone] Office: [phone]  Malka So 01/07/2021, 10:43 AM

## 2021-01-07 NOTE — Evaluation (Signed)
Physical Therapy Evaluation Patient Details Name: Leon Estes MRN: [id number] DOB: [birth year] Today's Date: 01/07/2021   History of Present Illness  87 year old male with history of CAD status post stenting to RCA in 1997, COPD, CKD stage III, hypertension, hyperlipidemia, BPH, recent admission at [facility] from 11/26/2020-11/29/2020 with presumed acute cholecystitis ,presented on 12/27/2020 with fever and generalized weakness.Hospital course has been complicated by delirium/agitation along with A. fib with RVR, C Diff., abdominal distention with imaging showing concern for toxic megacolon  Clinical Impression  The patient  Tolerated mobilizing from bed to recliner very well with mod assistance. Patient should progress well. No family present to discuss DC plan. VSS, no dizziness when sitting and standing considering  First time OOB since admission. Pt admitted with above diagnosis.  Pt currently with functional limitations due to the deficits listed below (see PT Problem List). Pt will benefit from skilled PT to increase their independence and safety with mobility to allow discharge to the venue listed below.       Follow Up Recommendations SNF vs HHPT if family available to assist with mobility for safety   Equipment Recommendations  None recommended by PT    Recommendations for Other Services       Precautions / Restrictions Precautions Precautions: Fall Restrictions Weight Bearing Restrictions: No      Mobility  Bed Mobility Overal bed mobility: Needs Assistance Bed Mobility: Supine to Sit     Supine to sit: Mod assist     General bed mobility comments: extra time, assist with trunk    Transfers Overall transfer level: Needs assistance Equipment used: Rolling walker (2 wheeled) Transfers: Sit to/from [facility] Sit to Stand: Mod assist Stand pivot transfers: Mod assist       General transfer comment: steady assistance to stand, able to take small  shuffle steps to recliner,  Steady assist for bal;ance  Ambulation/Gait             General Gait Details: several steps to recliner  Stairs            Wheelchair Mobility    Modified Rankin (Stroke Patients Only)       Balance Overall balance assessment: Needs assistance Sitting-balance support: Bilateral upper extremity supported;Feet supported Sitting balance-Leahy Scale: Fair     Standing balance support: During functional activity;Bilateral upper extremity supported Standing balance-Leahy Scale: Poor Standing balance comment: reliant on Rw and steady support                             Pertinent Vitals/Pain Pain Assessment: No/denies pain Faces Pain Scale: No hurt    Home Living Family/patient expects to be discharged to:: Private residence Living Arrangements: Spouse/significant other     Home Access: Stairs to enter   Entrance Stairs-Number of Steps: 2 Home Layout: Two level;Able to live on main level with bedroom/bathroom Home Equipment: Gilford Rile - 2 wheels      Prior Function Level of Independence: Needs assistance   Gait / Transfers Assistance Needed: INDEP. Has walker and cane, but rarely uses.  ADL's / Homemaking Assistance Needed: Pt is INDEP with self care. Spouse drives d/t pt with decreased vision.        Hand Dominance   Dominant Hand: Right    Extremity/Trunk Assessment        Lower Extremity Assessment Lower Extremity Assessment: Generalized weakness    Cervical / Trunk Assessment Cervical / Trunk Assessment: Kyphotic  Communication  Communication: No difficulties  Cognition Arousal/Alertness: Awake/alert Behavior During Therapy: WFL for tasks assessed/performed Overall Cognitive Status: Within Functional Limits for tasks assessed                                 General Comments: generally oriented to [facility] cone, has been here 10 days,oriented to month and yr and near to date. Did state he's  in hospital due to a mass.      General Comments      Exercises General Exercises - Lower Extremity Long Arc Quad: AROM;Both;10 reps;Seated Hip Flexion/Marching: AROM;Both;10 reps;Seated   Assessment/Plan    PT Assessment Patient needs continued PT services  PT Problem List Decreased strength;Decreased mobility;Decreased knowledge of precautions;Decreased activity tolerance;Decreased balance       PT Treatment Interventions DME instruction;Therapeutic activities;Gait training;Therapeutic exercise;Patient/family education;Functional mobility training;Balance training    PT Goals (Current goals can be found in the Care Plan section)  Acute Rehab PT Goals Patient Stated Goal: ready to get out of bed, go home PT Goal Formulation: With patient Time For Goal Achievement: 01/21/21 Potential to Achieve Goals: Good    Frequency Min 3X/week   Barriers to discharge        Co-evaluation               AM-PAC PT "6 Clicks" Mobility  Outcome Measure Help needed turning from your back to your side while in a flat bed without using bedrails?: A Little Help needed moving from lying on your back to sitting on the side of a flat bed without using bedrails?: A Little Help needed moving to and from a bed to a chair (including a wheelchair)?: A Lot Help needed standing up from a chair using your arms (e.g., wheelchair or bedside chair)?: A Lot Help needed to walk in hospital room?: A Lot Help needed climbing 3-5 steps with a railing? : A Lot 6 Click Score: 14    End of Session Equipment Utilized During Treatment: Gait belt Activity Tolerance: Patient tolerated treatment well Patient left: in chair;with call bell/phone within reach;with chair alarm set Nurse Communication: Mobility status PT Visit Diagnosis: Unsteadiness on feet (R26.81);Difficulty in walking, not elsewhere classified (R26.2)    Time: 6767-2094 PT Time Calculation (min) (ACUTE ONLY): 26 min   Charges:   PT  Evaluation $PT Eval Low Complexity: [place] [facility] Acute Rehabilitation Services Pager [phone] Office [phone]   Claretha Cooper 01/07/2021, 10:15 AM

## 2021-01-07 NOTE — Progress Notes (Signed)
9 Days Post-Op   Subjective/Chief Complaint: Feels ok Denies abdominal pain   WBC up   Objective: Vital signs in last 24 hours: Temp:  [97.2 F (36.2 C)-98 F (36.7 C)] 97.9 F (36.6 C) (05/28 0400) Pulse Rate:  [60-102] 71 (05/28 0600) Resp:  [18-24] 19 (05/28 0600) BP: (126-195)/(67-99) 147/74 (05/28 0600) SpO2:  [96 %-100 %] 96 % (05/28 0600) Weight:  [73.1 kg] 73.1 kg (05/28 0500) Last BM Date: 01/06/21  Intake/Output from previous day: 05/27 0701 - 05/28 0700 In: 2505.8 [I.V.:1496.2; IV Piggyback:1009.6] Out: 1900 [Urine:1900] Intake/Output this shift: Total I/O In: 257.6 [I.V.:103.6; IV Piggyback:154] Out: -   GI: soft nontender distended   Lab Results:  Recent Labs    01/06/21 0250 01/07/21 0546  WBC 11.1* 16.7*  HGB 10.6* 10.5*  HCT 33.3* 31.7*  PLT 230 232   BMET Recent Labs    01/07/21 0213 01/07/21 0546  NA 139 138  K 2.9* 2.8*  CL 112* 110  CO2 20* 22  GLUCOSE 144* 149*  BUN 19 20  CREATININE 0.69 0.68  CALCIUM 8.1* 8.1*   PT/INR No results for input(s): LABPROT, INR in the last 72 hours. ABG No results for input(s): PHART, HCO3 in the last 72 hours.  Invalid input(s): PCO2, PO2  Studies/Results: DG Chest Port 1 View  Result Date: 01/06/2021 CLINICAL DATA:  Status post right upper extremity PICC placement EXAM: PORTABLE CHEST 1 VIEW COMPARISON:  12/31/2020 FINDINGS: Interval placement of right upper extremity PICC, tip projecting over the lower SVC. Cardiomegaly. No acute abnormality of the lungs. IMPRESSION: 1. Interval placement of right upper extremity PICC, tip projecting over the lower SVC. 2. Cardiomegaly. Electronically Signed   By: Eddie Candle M.D.   On: 01/06/2021 10:08   DG Abd Portable 1V  Result Date: 01/06/2021 CLINICAL DATA:  [name] EXAM: PORTABLE ABDOMEN - 1 VIEW COMPARISON:  Portable exam at 0810 hrs compared to 01/05/2021 FINDINGS: Mild diffuse gaseous distention of colon. No bowel wall thickening. Small  bowel gas pattern normal. Osseous structures normal. No urinary tract calcifications. IMPRESSION: Mild gaseous distention of colon without definite wall thickening. Electronically Signed   By: Lavonia Dana M.D.   On: 01/06/2021 10:17   DG Abd Portable 1V  Result Date: 01/05/2021 CLINICAL DATA:  Abdominal distension. EXAM: PORTABLE ABDOMEN - 1 VIEW COMPARISON:  Jan 04, 2021. FINDINGS: Stable gaseous distention of the colon is noted with the proximal transverse colon measuring 14 cm in diameter, which is not changed compared to prior exam. No definite small bowel dilatation is noted. IMPRESSION: Stable gaseous distention of the colon is noted as described above. Electronically Signed   By: Marijo Conception M.D.   On: 01/05/2021 12:54   [place] EKG SITE RITE  Result Date: 01/05/2021 If Site Rite image not attached, placement could not be confirmed due to current cardiac rhythm.   Anti-infectives: Anti-infectives (From admission, onward)   Start     Dose/Rate Route Frequency Ordered Stop   01/06/21 1245  vancomycin (VANCOCIN) 50 mg/mL oral solution 500 mg        500 mg Oral Every 6 hours 01/06/21 1150     01/06/21 0000  vancomycin (VANCOCIN) 50 mg/mL oral solution 125 mg  Status:  Discontinued        125 mg Oral Every 6 hours 01/05/21 2038 01/06/21 1150   01/02/21 1330  vancomycin (VANCOCIN) 500 mg in sodium chloride irrigation 0.9 % 100 mL ENEMA  500 mg Rectal Every 6 hours 01/02/21 1234 01/16/21 1159   12/30/20 1200  vancomycin (VANCOCIN) 50 mg/mL oral solution 500 mg  Status:  Discontinued        500 mg Oral Every 6 hours 12/30/20 0956 01/02/21 1234   12/30/20 1200  metroNIDAZOLE (FLAGYL) IVPB 500 mg        500 mg 100 mL/hr over 60 Minutes Intravenous Every 8 hours 12/30/20 0956     12/29/20 0600  vancomycin (VANCOREADY) IVPB 1250 mg/250 mL  Status:  Discontinued        1,250 mg 166.7 mL/hr over 90 Minutes Intravenous Every 36 hours 12/27/20 2315 12/28/20 1007   12/28/20 2000  vancomycin  (VANCOCIN) 50 mg/mL oral solution 125 mg  Status:  Discontinued        125 mg Oral Every 6 hours 12/28/20 1801 12/30/20 0956   12/28/20 0830  clindamycin (CLEOCIN) IVPB 900 mg  Status:  Discontinued       "And" Linked Group Details   900 mg 100 mL/hr over 30 Minutes Intravenous On call to O.R. 12/28/20 0865 12/29/20 0559   12/28/20 0830  gentamicin (GARAMYCIN) 360 mg in dextrose 5 % 100 mL IVPB  Status:  Discontinued       "And" Linked Group Details   5 mg/kg  71.2 kg 109 mL/hr over 60 Minutes Intravenous On call to O.R. 12/28/20 7846 12/29/20 0559   12/28/20 0600  ceFEPIme (MAXIPIME) 2 g in sodium chloride 0.9 % 100 mL IVPB  Status:  Discontinued        2 g 200 mL/hr over 30 Minutes Intravenous Every 12 hours 12/27/20 2315 12/29/20 0955   12/28/20 0200  metroNIDAZOLE (FLAGYL) IVPB 500 mg  Status:  Discontinued        500 mg 100 mL/hr over 60 Minutes Intravenous Every 8 hours 12/27/20 2202 12/29/20 0955   12/27/20 1830  aztreonam (AZACTAM) 2 g in sodium chloride 0.9 % 100 mL IVPB  Status:  Discontinued        2 g 200 mL/hr over 30 Minutes Intravenous  Once 12/27/20 1820 12/27/20 1823   12/27/20 1830  metroNIDAZOLE (FLAGYL) IVPB 500 mg        500 mg 100 mL/hr over 60 Minutes Intravenous  Once 12/27/20 1820 12/27/20 1935   12/27/20 1830  vancomycin (VANCOCIN) IVPB 1000 mg/200 mL premix        1,000 mg 200 mL/hr over 60 Minutes Intravenous  Once 12/27/20 1820 12/27/20 1934   12/27/20 1830  ceFEPIme (MAXIPIME) 2 g in sodium chloride 0.9 % 100 mL IVPB        2 g 200 mL/hr over 30 Minutes Intravenous  Once 12/27/20 1824 12/27/20 1903      Assessment/Plan: C. Diff colitis  Patient without any concern for peritonitis - no pain in abdomen  Cholelithiasis Chronic cholecystitis- follow up with Dr. Redmond Pulling as outpatient as C diff source of infection this admit.   No role for surgery currently    Will follow from the periphery for now    LOS: 11 days    Marcello Moores A  Makiah Foye 01/07/2021

## 2021-01-07 NOTE — Progress Notes (Signed)
PROGRESS NOTE  Leon Estes TMH:[id number] DOB: 1931/01/24 DOA: 12/27/2020 PCP: Venia Carbon, MD  HPI/Recap of past 51 hours: 87 year old male with history of CAD status post stenting to RCA in 1997, COPD, CKD stage III, hypertension, hyperlipidemia, BPH, recent admission at [facility] from 11/26/2020-11/29/2020 with presumed acute cholecystitis (RUQ ultrasound and CT abdomen/pelvis were equivocal. HIDA scan was consistent with cystic duct obstruction) treated medically with antibiotics and he was followed up by general surgery as an outpatient with plans for laparoscopic cholecystectomy on 01/10/2021.  He presented on 12/27/2020 with fever and generalized weakness.  On presentation, temperature was 103.1 F rectally along with tachypnea and tachycardia, WBC of 15.3, creatinine of 1.48, normal LFTs, lactic acid of 2.8 and then 3.2 and BNP of 689.3.  COVID-19 and influenza tests were negative.  Chest x-ray was negative for focal consolidation. CT abdomen/pelvis without contrast showed prostatomegaly, stable appearance of the gallbladder with mild stable central intrahepatic biliary dilatation, and extensive chronic and degenerative changes within the lower lumbar spine.  He was started on IV fluids and antibiotics.  General surgery was consulted.  Initially plan was for surgical intervention but it was postponed once stool tested positive for C. difficile and patient was started on oral vancomycin.  Hospital course was complicated by delirium/agitation along with A. fib with RVR.  Cardiology and palliative care were consulted.    Hospital course also complicated by toxic megacolon.  Oral vancomycin was changed to vancomycin enema, while on IV Flagyl.  01/07/21: Patient was seen and examined at his bedside.  He is more alert and interactive off Haldol and Ativan.  He denies having any abdominal pain or nausea.  His abdomen is distended on exam but bowel sounds present.  Mucous membrane dry.  He was evaluated by  speech therapy on 5/23, with recommendation for thin liquid diet, at the time he had mild aspiration risk.  He was seen by general surgery today, no role for surgery at this time.  Surgery will follow up peripherally.  Assessment/Plan: Principal Problem:   Colitis due to Clostridioides difficile Active Problems:   CAD (coronary artery disease)   Benign essential HTN   COPD (chronic obstructive pulmonary disease) (HCC)   Chronic renal disease, stage III (HCC)   BPH (benign prostatic hyperplasia)   Severe sepsis (HCC)   HOH (hard of hearing)   Acute on chronic cholecystitis   Hypokalemia   Acute delirium   Palliative care by specialist   Goals of care, counseling/discussion   General weakness   Toxic megacolon ([place])  Severe sepsis, present on admission, secondary to [name], chronic cholecystitis Patient was recently hospitalized at [facility] with presumed cholecystitis treated with antibiotics with outpatient follow-up with general surgery with plans for laparoscopic cholecystectomy on 01/10/2021. -Presented with temperature of 102.1 F rectally along with tachypnea, tachycardia, WBC of 15.3, elevated lactic acid.  No clear-cut source of infection with negative chest x-ray. COVID-19 and influenza tests were negative.  - CT abdomen/pelvis showed stable appearance of the gallbladder with mild stable central intrahepatic biliary dilatation.  Right upper quadrant ultrasound shows cholelithiasis without acute cholecystitis.  LFTs trending downwards. -General surgery initially were planning for surgical intervention on 12/28/2020 but surgery was postponed because of C. difficile.  CT of the abdomen pelvis on 12/30/2020 showed diffusely distended colon with concern for toxic megacolon.  No role for surgery at this time.  Surgery will follow up peripherally. Oral vancomycin was changed to vancomycin enema by general surgery on  12/30/2020, continue IV Flagyl. He is currently n.p.o., TPN was  started on 01/05/2021. We will reconsult speech and start clear liquid diet and monitor if tolerates.  Electrolytes derangement Refractory hypokalemia, repleted intravenously and with TPN Refractory hypophosphatemia, repleted intravenously and with TPN Repeat BMP this afternoon  Leukocytosis, suspect reactive in the setting of [name] and recent IV steroids. WBC uptrending 16.7 from 11.1. DC'd Solu-Medrol on 01/06/2021. No acute abnormality in the lungs from chest x-ray done on 01/06/2021, personally reviewed. Blood cultures x2 negative final. Urine culture multiple species present. MRSA screen positive on 12/28/2020. Continue to treat underlying conditions. Currently afebrile and nontoxic-appearing.  Resolved delirium/confusion Suspect secondary to benzodiazepine toxicity. Avoid benzos, opiates, and sedative agents. Vitamin H96, folic acid and TSH levels normal Reorient as needed. Use Haldol judiciously.  Resolved Non anion gap metabolic acidosis Serum bicarb is uptrending, 22 from 19 from 17 Continue TPN. Appreciate pharmacy's assistance.  Hypertension: BP is now stable, off IV Solu-Medrol, DC'd on 01/06/2021. IV antihypertensives as needed Continue to monitor vital signs  COPD Stable Continue Home regimen, nebs, bronchodilators.  Paroxysmal A. fib with RVR -Not on anticoagulation as an outpatient.  -Rate better controlled currently.  Cardiology signed off on 01/01/2021.  Continue oral metoprolol if able to tolerate.  Use as needed IV metoprolol. -Echo as below shows EF of 50 to 55%  CAD status post PCI Denies any anginal symptoms at the time of this visit. He is currently n.p.o. Resume aspirin, metoprolol, simvastatin, when appropriate.   Will need outpatient follow-up with cardiology  Resolved hypernatremia -Resolved.   Continue gentle hydration with D5.  Resolved AKI Is currently back to his baseline creatinine.   Continue to avoid nephrotoxic  agents, dehydration and hypotension.    Hyperlipidemia: -Continue simvastatin if able to take orally.  BPH: -Continue tamsulosin able to take orally.  Generalized deconditioning Was seen by PT with recommendation for SNF versus home health PT if family available to assist with mobility for safety. Continue fall precautions.  Goals of care Appreciate palliative care team's assistance. DNR.   DVT prophylaxis: Subcutaneous heparin 3 times daily Code Status: DNR Family Communication:  None at bedside. Disposition Plan: Status is: Inpatient  Remains inpatient appropriate because:Inpatient level of care appropriate due to severity of illness   Dispo: The patient is from: Home  Anticipated d/c is to: Undetermined at the time of this visit.  Patient currently is not medically stable to d/c.              Difficult to place patient No  Consultants: General surgery/cardiology/palliative care  Procedures: Echo  Antimicrobials:             Anti-infectives (From admission, onward)     Start     Dose/Rate Route Frequency Ordered Stop   01/02/21 1330  vancomycin (VANCOCIN) 500 mg in sodium chloride irrigation 0.9 % 100 mL ENEMA        500 mg Rectal Every 6 hours 01/02/21 1234 01/16/21 1159   12/30/20 1200  vancomycin (VANCOCIN) 50 mg/mL oral solution 500 mg  Status:  Discontinued        500 mg Oral Every 6 hours 12/30/20 0956 01/02/21 1234   12/30/20 1200  metroNIDAZOLE (FLAGYL) IVPB 500 mg        500 mg 100 mL/hr over 60 Minutes Intravenous Every 8 hours 12/30/20 0956     12/29/20 0600  vancomycin (VANCOREADY) IVPB 1250 mg/250 mL  Status:  Discontinued  1,250 mg 166.7 mL/hr over 90 Minutes Intravenous Every 36 hours 12/27/20 2315 12/28/20 1007   12/28/20 2000  vancomycin (VANCOCIN) 50 mg/mL oral solution 125 mg  Status:  Discontinued        125 mg Oral Every 6 hours 12/28/20 1801 12/30/20 0956   12/28/20 0830  clindamycin  (CLEOCIN) IVPB 900 mg  Status:  Discontinued       "And" Linked Group Details   900 mg 100 mL/hr over 30 Minutes Intravenous On call to O.R. 12/28/20 1610 12/29/20 0559   12/28/20 0830  gentamicin (GARAMYCIN) 360 mg in dextrose 5 % 100 mL IVPB  Status:  Discontinued       "And" Linked Group Details   5 mg/kg  71.2 kg 109 mL/hr over 60 Minutes Intravenous On call to O.R. 12/28/20 9604 12/29/20 0559   12/28/20 0600  ceFEPIme (MAXIPIME) 2 g in sodium chloride 0.9 % 100 mL IVPB  Status:  Discontinued        2 g 200 mL/hr over 30 Minutes Intravenous Every 12 hours 12/27/20 2315 12/29/20 0955   12/28/20 0200  metroNIDAZOLE (FLAGYL) IVPB 500 mg  Status:  Discontinued        500 mg 100 mL/hr over 60 Minutes Intravenous Every 8 hours 12/27/20 2202 12/29/20 0955   12/27/20 1830  aztreonam (AZACTAM) 2 g in sodium chloride 0.9 % 100 mL IVPB  Status:  Discontinued        2 g 200 mL/hr over 30 Minutes Intravenous  Once 12/27/20 1820 12/27/20 1823   12/27/20 1830  metroNIDAZOLE (FLAGYL) IVPB 500 mg        500 mg 100 mL/hr over 60 Minutes Intravenous  Once 12/27/20 1820 12/27/20 1935   12/27/20 1830  vancomycin (VANCOCIN) IVPB 1000 mg/200 mL premix        1,000 mg 200 mL/hr over 60 Minutes Intravenous  Once 12/27/20 1820 12/27/20 1934   12/27/20 1830  ceFEPIme (MAXIPIME) 2 g in sodium chloride 0.9 % 100 mL IVPB        2 g 200 mL/hr over 30 Minutes Intravenous  Once 12/27/20 1824 12/27/20 1903           Objective: Vitals:   01/07/21 0910 01/07/21 1000 01/07/21 1029 01/07/21 1138  BP: (!) 142/70 (!) 197/66 (!) 147/62   Pulse: 62 67 (!) 49   Resp: 18 (!) 24 19   Temp:    97.7 F (36.5 C)  TempSrc:    Oral  SpO2: 98% 99% 97%   Weight:      Height:        Intake/Output Summary (Last 24 hours) at 01/07/2021 1246 Last data filed at 01/07/2021 1202 Gross per 24 hour  Intake 3510.42 ml  Output 1900 ml  Net 1610.42 ml   Filed Weights   01/03/21 0438 01/04/21 0349 01/07/21  0500  Weight: 72.2 kg 74.3 kg 73.1 kg    Exam:  . General: 87 y.o. year-old male frail-appearing in no acute distress.  He is alert and interactive.   . Cardiovascular: Regular rate and rhythm with no rubs or gallops.  Respiratory: Clear to auscultation with no wheezes or rales.  Good inspiratory effort.   . Abdomen: Distended bowel sounds present.  Nontender with palpation. . Musculoskeletal: No lower extremity edema bilaterally.   . Skin: No ulcerative lesions noted.   Marland Kitchen Psychiatry: Mood is appropriate for condition and setting.  Data Reviewed: CBC: Recent Labs  Lab 01/02/21 1103 01/03/21 0240 01/04/21 0242 01/05/21  0240 01/06/21 0250 01/07/21 0546  WBC 8.6 8.7 10.8* 12.8* 11.1* 16.7*  NEUTROABS 7.3 7.3 8.8* 10.4* 9.0*  --   HGB 10.6* 10.1* 10.5* 10.6* 10.6* 10.5*  HCT 33.5* 32.0* 33.2* 32.5* 33.3* 31.7*  MCV 89.6 89.6 88.5 87.1 87.6 85.7  PLT 265 245 248 250 230 891   Basic Metabolic Panel: Recent Labs  Lab 01/04/21 0242 01/05/21 0240 01/06/21 0250 01/06/21 1841 01/07/21 0213 01/07/21 0546  NA 139 138 140 136 139 138  K 3.5 2.9* 2.8* 3.0* 2.9* 2.8*  CL 114* 115* 113* 109 112* 110  CO2 19* 17* 19* 22 20* 22  GLUCOSE 187* 150* 138* 231* 144* 149*  BUN 36* 31* 23 19 19 20   CREATININE 1.07 0.77 0.74 0.84 0.69 0.68  CALCIUM 8.5* 8.2* 8.2* 8.0* 8.1* 8.1*  MG 2.2 1.9 1.8  --  1.8 1.9  PHOS  --   --  2.1*  --  2.4* 2.4*   GFR: Estimated Creatinine Clearance: 60.8 mL/min (by C-G formula based on SCr of 0.68 mg/dL). Liver Function Tests: Recent Labs  Lab 01/02/21 1103 01/03/21 0240 01/04/21 0242 01/05/21 0240 01/06/21 0250  AST 45* 35 27 23 23   ALT 54* 48* 41 37 34  ALKPHOS 55 50 47 41 39  BILITOT 0.6 0.5 0.8 0.4 0.5  PROT 6.1* 5.3* 5.4* 5.0* 5.1*  ALBUMIN 3.1* 2.9* 2.9* 2.6* 2.6*   No results for input(s): LIPASE, AMYLASE in the last 168 hours. No results for input(s): AMMONIA in the last 168 hours. Coagulation Profile: No results for input(s): INR,  PROTIME in the last 168 hours. Cardiac Enzymes: No results for input(s): CKTOTAL, CKMB, CKMBINDEX, TROPONINI in the last 168 hours. BNP (last 3 results) No results for input(s): PROBNP in the last 8760 hours. HbA1C: Recent Labs    01/06/21 0250  HGBA1C 6.5*   CBG: Recent Labs  Lab 01/06/21 1226 01/06/21 1829 01/06/21 2326 01/07/21 0620 01/07/21 1216  GLUCAP 179* 218* 166* 134* 141*   Lipid Profile: Recent Labs    01/06/21 0250  TRIG 85   Thyroid Function Tests: No results for input(s): TSH, T4TOTAL, FREET4, T3FREE, THYROIDAB in the last 72 hours. Anemia Panel: No results for input(s): VITAMINB12, FOLATE, FERRITIN, TIBC, IRON, RETICCTPCT in the last 72 hours. Urine analysis:    Component Value Date/Time   COLORURINE AMBER (A) 12/27/2020 2214   APPEARANCEUR HAZY (A) 12/27/2020 2214   APPEARANCEUR Clear 04/29/2013 1543   LABSPEC 1.039 (H) 12/27/2020 2214   LABSPEC 1.020 04/29/2013 1543   PHURINE 5.0 12/27/2020 2214   GLUCOSEU NEGATIVE 12/27/2020 2214   GLUCOSEU Negative 04/29/2013 1543   HGBUR SMALL (A) 12/27/2020 2214   BILIRUBINUR NEGATIVE 12/27/2020 2214   BILIRUBINUR + 07/25/2018 1304   BILIRUBINUR Negative 04/29/2013 1543   KETONESUR 5 (A) 12/27/2020 2214   PROTEINUR >=300 (A) 12/27/2020 2214   UROBILINOGEN 0.2 07/25/2018 1304   UROBILINOGEN 0.2 03/08/2012 1624   NITRITE NEGATIVE 12/27/2020 2214   LEUKOCYTESUR NEGATIVE 12/27/2020 2214   LEUKOCYTESUR Negative 04/29/2013 1543   Sepsis Labs: @LABRCNTIP (procalcitonin:4,lacticidven:4)  ) No results found for this or any previous visit (from the past 240 hour(s)).    Studies: No results found.  Scheduled Meds: . aspirin EC  81 mg Oral Daily  . budesonide (PULMICORT) nebulizer solution  0.5 mg Nebulization BID  . Chlorhexidine Gluconate Cloth  6 each Topical Daily  . heparin  5,000 Units Subcutaneous Q8H  . insulin aspart  0-9 Units Subcutaneous Q6H  . ipratropium-albuterol  3 mL Nebulization BID  .  mouth rinse  15 mL Mouth Rinse BID  . metoprolol tartrate  25 mg Oral Q6H  . pantoprazole  40 mg Oral Daily  . simvastatin  40 mg Oral QPM  . sodium chloride flush  10-40 mL Intracatheter Q12H  . sodium chloride flush  3 mL Intravenous Q12H  . tamsulosin  0.4 mg Oral Daily  . vancomycin (VANCOCIN) rectal ENEMA  500 mg Rectal Q6H    Continuous Infusions: . sodium chloride 10 mL/hr at 01/07/21 1202  . dextrose 5 % with kcl Stopped (01/07/21 1201)  . dextrose 5 % with kcl    . metronidazole 100 mL/hr at 01/07/21 1202  . potassium chloride 10 mEq (01/07/21 1202)  . TPN ADULT (ION) 40 mL/hr at 01/07/21 1202  . TPN ADULT (ION)       LOS: 11 days     Kayleen Memos, MD [facility] Pager [phone]  If 7PM-7AM, please contact night-coverage [email or web address] Password [facility] 01/07/2021, 12:46 PM

## 2021-01-07 NOTE — Progress Notes (Signed)
SLP Cancellation Note  Patient Details Name: Leon Estes MRN: [id number] DOB: 04-Aug-1931   Cancelled treatment:       Reason Eval/Treat Not Completed: Other (comment) (order for swallow eval received, pt has TPN at this time, will see pt for evaluation as soon as able; Thanks for this order.   Kathleen Lime, MS [facility] SLP Acute Rehab Services Office [phone] Pager [phone]   Macario Golds 01/07/2021, 2:25 PM

## 2021-01-08 ENCOUNTER — Inpatient Hospital Stay (HOSPITAL_COMMUNITY): Payer: PPO

## 2021-01-08 DIAGNOSIS — N401 Enlarged prostate with lower urinary tract symptoms: Secondary | ICD-10-CM | POA: Diagnosis not present

## 2021-01-08 DIAGNOSIS — K812 Acute cholecystitis with chronic cholecystitis: Secondary | ICD-10-CM | POA: Diagnosis not present

## 2021-01-08 DIAGNOSIS — A0472 Enterocolitis due to Clostridium difficile, not specified as recurrent: Secondary | ICD-10-CM | POA: Diagnosis not present

## 2021-01-08 DIAGNOSIS — I10 Essential (primary) hypertension: Secondary | ICD-10-CM | POA: Diagnosis not present

## 2021-01-08 LAB — CBC
HCT: 31.4 % — ABNORMAL LOW (ref 39.0–52.0)
Hemoglobin: 10.3 g/dL — ABNORMAL LOW (ref 13.0–17.0)
MCH: 28.2 pg (ref 26.0–34.0)
MCHC: 32.8 g/dL (ref 30.0–36.0)
MCV: 86 fL (ref 80.0–100.0)
Platelets: 200 10*3/uL (ref 150–400)
RBC: 3.65 MIL/uL — ABNORMAL LOW (ref 4.22–5.81)
RDW: 15.3 % (ref 11.5–15.5)
WBC: 13.3 10*3/uL — ABNORMAL HIGH (ref 4.0–10.5)
nRBC: 0 % (ref 0.0–0.2)

## 2021-01-08 LAB — GLUCOSE, CAPILLARY
Glucose-Capillary: 141 mg/dL — ABNORMAL HIGH (ref 70–99)
Glucose-Capillary: 145 mg/dL — ABNORMAL HIGH (ref 70–99)
Glucose-Capillary: 154 mg/dL — ABNORMAL HIGH (ref 70–99)
Glucose-Capillary: 175 mg/dL — ABNORMAL HIGH (ref 70–99)

## 2021-01-08 LAB — BASIC METABOLIC PANEL
Anion gap: 4 — ABNORMAL LOW (ref 5–15)
Anion gap: 5 (ref 5–15)
BUN: 19 mg/dL (ref 8–23)
BUN: 17 mg/dL (ref 8–23)
CO2: 24 mmol/L (ref 22–32)
CO2: 24 mmol/L (ref 22–32)
Calcium: 7.8 mg/dL — ABNORMAL LOW (ref 8.9–10.3)
Calcium: 7.7 mg/dL — ABNORMAL LOW (ref 8.9–10.3)
Chloride: 111 mmol/L (ref 98–111)
Chloride: 109 mmol/L (ref 98–111)
Creatinine, Ser: 0.66 mg/dL (ref 0.61–1.24)
Creatinine, Ser: 0.73 mg/dL (ref 0.61–1.24)
GFR, Estimated: >60 mL/min (ref >60)
GFR, Estimated: >60 mL/min (ref >60)
Glucose, Bld: 119 mg/dL — ABNORMAL HIGH (ref 70–99)
Glucose, Bld: 130 mg/dL — ABNORMAL HIGH (ref 70–99)
Potassium: 2.9 mmol/L — ABNORMAL LOW (ref 3.5–5.1)
Potassium: 3.2 mmol/L — ABNORMAL LOW (ref 3.5–5.1)
Sodium: 139 mmol/L (ref 135–145)
Sodium: 138 mmol/L (ref 135–145)

## 2021-01-08 LAB — PHOSPHORUS: Phosphorus: 2.4 mg/dL — ABNORMAL LOW (ref 2.5–4.6)

## 2021-01-08 LAB — MAGNESIUM: Magnesium: 1.7 mg/dL (ref 1.7–2.4)

## 2021-01-08 MED ORDER — LABETALOL HCL 5 MG/ML IV SOLN: 10.0000 mg | INTRAVENOUS | Status: DC | PRN

## 2021-01-08 MED ORDER — ENOXAPARIN SODIUM 40 MG/0.4ML IJ SOSY
40.0000 mg | PREFILLED_SYRINGE | INTRAMUSCULAR | Status: DC
  Administered 2021-01-09 – 2021-01-22 (×14): 40 mg via SUBCUTANEOUS
  Filled 2021-01-08 (×14): qty 0.4

## 2021-01-08 MED ORDER — POTASSIUM PHOSPHATES 15 MMOLE/5ML IV SOLN
30.0000 mmol | Freq: Once | INTRAVENOUS | Status: AC
  Administered 2021-01-08: 30 mmol via INTRAVENOUS
  Filled 2021-01-08: qty 10

## 2021-01-08 MED ORDER — ENOXAPARIN SODIUM 80 MG/0.8ML IJ SOSY
70.0000 mg | PREFILLED_SYRINGE | Freq: Every day | INTRAMUSCULAR | Status: DC
  Administered 2021-01-08: 70 mg via SUBCUTANEOUS
  Filled 2021-01-08: qty 0.8

## 2021-01-08 MED ORDER — DEXTROSE 5 % IV SOLN: INTRAVENOUS | Status: DC

## 2021-01-08 MED ORDER — POTASSIUM CHLORIDE 10 MEQ/50ML IV SOLN
10.0000 meq | INTRAVENOUS | Status: AC
  Administered 2021-01-08 (×4): 10 meq via INTRAVENOUS
  Filled 2021-01-08 (×4): qty 50

## 2021-01-08 MED ORDER — POTASSIUM CHLORIDE CRYS ER 20 MEQ PO TBCR
40.0000 meq | EXTENDED_RELEASE_TABLET | Freq: Once | ORAL | Status: AC
  Administered 2021-01-08: 40 meq via ORAL
  Filled 2021-01-08: qty 2

## 2021-01-08 MED ORDER — TRAVASOL 10 % IV SOLN
INTRAVENOUS | Status: AC
  Filled 2021-01-08: qty 432

## 2021-01-08 MED ORDER — MAGNESIUM SULFATE IN D5W 1-5 GM/100ML-% IV SOLN
1.0000 g | Freq: Once | INTRAVENOUS | Status: AC
  Administered 2021-01-08: 1 g via INTRAVENOUS
  Filled 2021-01-08: qty 100

## 2021-01-08 MED ORDER — TRAVASOL 10 % IV SOLN
INTRAVENOUS | Status: DC
  Filled 2021-01-08: qty 810

## 2021-01-08 NOTE — Progress Notes (Signed)
ANTICOAGULATION CONSULT NOTE - Initial Consult  Pharmacy Consult for Enoxaparin Indication: atrial fibrillation  Allergies  Allergen Reactions  . Sulfa Antibiotics Rash  . Benzalkonium Chloride     "Benzalkonium chloride is a quaternary ammonium antiseptic and disinfectant with actions and uses similar to those of other cationic surfactants. It is also used as an antimicrobial preservative for pharmaceutical products."  . Neosporin [Neomycin-Bacitracin Zn-Polymyx]   . Augmentin [Amoxicillin-Pot Clavulanate] Rash  . Terbinafine Rash  . Terbinafine And Related Rash    Patient Measurements: Height: 5\' 7"  (170.2 cm) Weight: 73.6 kg (162 lb 4.1 oz) IBW/kg (Calculated) : 66.1 Heparin Dosing Weight:   Vital Signs: Temp: 97.4 F (36.3 C) (05/29 0438) Temp Source: Oral (05/29 0438) BP: 142/73 (05/29 0438) Pulse Rate: 96 (05/29 0438)  Labs: Recent Labs    01/06/21 0250 01/06/21 1841 01/07/21 0546 01/07/21 1430 01/08/21 0310  HGB 10.6*  --  10.5*  --  10.3*  HCT 33.3*  --  31.7*  --  31.4*  PLT 230  --  232  --  200  CREATININE 0.74   < > 0.68 0.83 0.66   < > = values in this interval not displayed.    Estimated Creatinine Clearance: 60.8 mL/min (by C-G formula based on SCr of 0.66 mg/dL).   Medical History: Past Medical History:  Diagnosis Date  . Asthma   . BPH (benign prostatic hypertrophy)   . Cataract   . Chronic renal disease, stage III ([place])   . Chronic sinusitis   . COPD (chronic obstructive pulmonary disease) ([place])   . Coronary artery disease    2 stents  . Gall stones   . Hearing loss    DOES NOT WEAR HEARING AIDS  . High cholesterol   . Hypertension   . Macular degeneration    right eye    Medications:  Scheduled:  . aspirin EC  81 mg Oral Daily  . budesonide (PULMICORT) nebulizer solution  0.5 mg Nebulization BID  . Chlorhexidine Gluconate Cloth  6 each Topical Daily  . enoxaparin (LOVENOX) injection  70 mg Subcutaneous Daily  . insulin aspart   0-9 Units Subcutaneous Q6H  . ipratropium-albuterol  3 mL Nebulization BID  . mouth rinse  15 mL Mouth Rinse BID  . metoprolol tartrate  25 mg Oral Q6H  . pantoprazole  40 mg Oral Daily  . simvastatin  40 mg Oral QPM  . sodium chloride flush  10-40 mL Intracatheter Q12H  . sodium chloride flush  3 mL Intravenous Q12H  . tamsulosin  0.4 mg Oral Daily  . vancomycin (VANCOCIN) rectal ENEMA  500 mg Rectal Q6H   Infusions:  . sodium chloride 10 mL/hr at 01/07/21 1419  . dextrose 5 % with kcl 20 mL/hr at 01/07/21 1707  . dextrose    . magnesium sulfate bolus IVPB    . metronidazole 500 mg (01/08/21 0338)  . potassium chloride    . potassium PHOSPHATE IVPB (in mmol)    . TPN ADULT (ION) 60 mL/hr at 01/07/21 1818  . TPN ADULT (ION)      Assessment: 68 yoM admitted on 5/17 with suspected acute cholecystitis, found to have Cdiff colitis, concerning for toxic megacolon.  Cardiology recommends anticoagulation for paroxysmal Afib when safe due to high risk for bleeding, and/or possible surgical indications (cards s/o 5/22).  No acute indication for surgery at this time.   Pharmacy is consulted to dose Lovenox. SCr 0.66 CBC: Hgb remains low/stable at 10.3, Plt WNL  Goal of Therapy:  Anti-Xa level 0.6-1 units/ml 4hrs after LMWH dose given Monitor platelets by anticoagulation protocol: Yes   Plan:  D/C Heparin SQ Enoxaparin 1 mg/kg (70mg ) SQ q12h Monitor renal function, CBC, s/s bleeding  Gretta Arab PharmD, BCPS Clinical Pharmacist [facility] main pharmacy [phone] 01/08/2021 9:53 AM

## 2021-01-08 NOTE — Plan of Care (Signed)
  Problem: Education: Goal: Knowledge of General Education information will improve Description: Including pain rating scale, medication(s)/side effects and non-pharmacologic comfort measures Outcome: Progressing   Problem: Clinical Measurements: Goal: Ability to maintain clinical measurements within normal limits will improve Outcome: Progressing Goal: Diagnostic test results will improve Outcome: Progressing   Problem: Coping: Goal: Level of anxiety will decrease Outcome: Progressing   Problem: Elimination: Goal: Will not experience complications related to urinary retention Outcome: Progressing

## 2021-01-08 NOTE — Progress Notes (Addendum)
PHARMACY - TOTAL PARENTERAL NUTRITION CONSULT NOTE   Indication: Prolonged NPO, Cdiff  Patient Measurements: Height: 5\' 7"  (170.2 cm) Weight: 73.6 kg (162 lb 4.1 oz) IBW/kg (Calculated) : 66.1 TPN AdjBW (KG): 71.2 Body mass index is 25.41 kg/m. Usual Weight:   Assessment: 32 yoM admitted on 5/17 with fever and weakness.  PMH significant for planned outpatient lap cholecystectomy on 5/31, recent inpt admission at [facility] 4/16-4/19, CAD s/p stent, COPD, CKD, HTN, HLD, BPH.  Surgical intervention has been postponed d/t severe Cdiff and sepsis, concern for toxic megacolon.  He was briefly on CLD from 5/21-5/23, but has been otherwise NPO.    Glucose / Insulin: No hx DM or any DM medications.  SoluMedrol (5/20-5/27) - CBGs:  106-143 - sensitive SSI:  4 units/ 24 hrs Electrolytes: K low and decreased to 2.9 (despite replacement), Phos remains low at 2.4 (despite replacement).  Mag 1.7, Cl and CO2, CorrCa 8.9, others WNL.  Renal: SCr 0.66, BUN wnl Hepatic: LFTs and Tbili WNL Prealbumin: 26.5 (5/27) Intake / Output; MIVF: D5 KCl 40 mEq/L at 20 ml/hr. - Net I/O: + 327mL; UOP 1.2 ml/kg/hr - Stool: x4 occurrences yesterday GI Imaging: 5/23 CT colon is diffusely distended and fluid-filled to the rectum,concerning for toxic megacolon; trace ascites, anasarca; Prostatomegaly, CAD 5/26 AXR:  Stable, unchanged gaseous distention of the colon; No definite small bowel dilatation is noted. GI Surgeries / Procedures:   Central access: PICC 5/26 TPN start date: 5/27  Nutritional Goals (per RD recommendation on 5/25): Kcal:  1600-1800 kcal, Protein:  70-85 grams, Fluid:  >/= 1.8 L/day Goal TPN rate is 75 mL/hr (provides 81 g of protein and 1760 kcals per day)  Current Nutrition:  NPO  Plan:  Now:  KPhos 30 mmol IV x1 (provides 44 mEq K+) KCl x 4 additional runs for total K replacement 84 mEq outside of TPN. Mag 1g IV x1 dose  At 18:00: Advance TPN to 75 mL/hr at 1800 Electrolytes in TPN: Na  19mEq/L, K 50 mEq/L, Ca 39mEq/L, Mg 78mEq/L, and Phos 62mmol/L. Cl:Ac - max Acetate.  - TPN provides additional K+ 55mEq  Add standard MVI and trace elements to TPN CBG and Sensitive SSI q6h  Reduce MIVF to plain D5 at 10 mL/hr at 1800 Recheck BMET today at 1800 to evaluate K replacement.  Monitor TPN labs on Mon/Thurs.  Gretta Arab PharmD, BCPS Clinical Pharmacist [facility] main pharmacy [phone] 01/08/2021,8:22 AM   Addendum: Cleared by speech for a dysphagia diet. Wean TPN to 1/2 rate today and follow up d/c tomorrow if tolerating diet. - TPN to infuse at 40 ml/hr. Continue to replace elytes and recheck at 18:00.    Gretta Arab PharmD, BCPS Clinical Pharmacist [facility] main pharmacy [phone] 01/08/2021 10:28 AM

## 2021-01-08 NOTE — Evaluation (Signed)
Clinical/Bedside Swallow Evaluation Patient Details  Name: Leon Estes MRN: [id number] Date of Birth: 1930/11/20  Today's Date: 01/08/2021 Time: SLP Start Time (ACUTE ONLY): 0851 SLP Stop Time (ACUTE ONLY): 0920 SLP Time Calculation (min) (ACUTE ONLY): 29 min  Past Medical History:  Past Medical History:  Diagnosis Date  . Asthma   . BPH (benign prostatic hypertrophy)   . Cataract   . Chronic renal disease, stage III ([place])   . Chronic sinusitis   . COPD (chronic obstructive pulmonary disease) ([place])   . Coronary artery disease    2 stents  . Gall stones   . Hearing loss    DOES NOT WEAR HEARING AIDS  . High cholesterol   . Hypertension   . Macular degeneration    right eye   Past Surgical History:  Past Surgical History:  Procedure Laterality Date  . APPENDECTOMY    . CORONARY ANGIOPLASTY WITH STENT PLACEMENT  1998   2 stents  . TRANSURETHRAL RESECTION OF PROSTATE     HPI:  87 y.o. male with medical history significant for CAD s/p PCI/stenting to RCA 1997, COPD, CKD stage III, HTN, HLD, BPH who is admitted with severe sepsis   Assessment / Plan / Recommendation Clinical Impression  Pt seen for clinical swallowing evaluation with oral holding, decreased oral propulsion, impaired mastication and delayed initiation of the swallow noted during evaluation suggesting oropharyngeal dysphagia.  Pt was oriented to self and place, but not time/situation.  Pt required min verbal cues during consumption to swallow more readily and consume POs initially.  Thin liquids via spoon/straw and puree only consistencies trialed d/t pt refusal (he was distracted by being cold/requiring bed/clothing change during BSE).  No overt s/s of aspiration noted during assessment, but pt is at mild risk d/t impaired cognition.  Recommend initiating a conservative diet of Dysphagia 2/thin liquids with ST f/u while in acute setting for diet tolerance/education with pt/caregivers. Thank you for this  consultation.  SLP Visit Diagnosis: Dysphagia, oropharyngeal phase (R13.12)    Aspiration Risk  Mild aspiration risk    Diet Recommendation   Dysphagia 2/thin liquids  Medication Administration: Whole meds with puree    Other  Recommendations Oral Care Recommendations: Oral care BID   Follow up Recommendations Other (comment) (TBD)      Frequency and Duration min 2x/week  1 week       Prognosis Prognosis for Safe Diet Advancement: Good      Swallow Study   General Date of Onset: 12/27/20 HPI: 87 y.o. male with medical history significant for CAD s/p PCI/stenting to RCA 1997, COPD, CKD stage III, HTN, HLD, BPH who is admitted with severe sepsis Type of Study: Bedside Swallow Evaluation Previous Swallow Assessment: 01/02/21 BSE indicated mild oropharyngeal dysphagia with delayed cough and dysphagia related to mentation rather than function at time of evaluation. Diet Prior to this Study: NPO (TPN primary source of nutrition) Temperature Spikes Noted: No Respiratory Status: Room air History of Recent Intubation: No Behavior/Cognition: Alert;Cooperative;Requires cueing Oral Cavity Assessment: Dry Oral Care Completed by SLP: Yes (min d/t refusal) Oral Cavity - Dentition: Edentulous Self-Feeding Abilities: Needs assist;Needs set up Patient Positioning: Upright in bed Baseline Vocal Quality: Low vocal intensity Volitional Cough: Weak Volitional Swallow: Able to elicit    Oral/Motor/Sensory Function Overall Oral Motor/Sensory Function: Generalized oral weakness Facial Symmetry: Within Functional Limits   Ice Chips Ice chips: Not tested   Thin Liquid Thin Liquid: Impaired Presentation: Spoon;Straw Oral Phase Functional Implications: Oral holding;Prolonged oral  transit Pharyngeal  Phase Impairments: Suspected delayed Swallow    Nectar Thick Nectar Thick Liquid: Not tested   Honey Thick Honey Thick Liquid: Not tested   Puree Puree: Impaired Presentation: Spoon Oral Phase  Functional Implications: Oral holding;Prolonged oral transit Pharyngeal Phase Impairments: Suspected delayed Swallow   Solid     Solid: Not tested (Pt refused)      Elvina Sidle, M.S., CCC-SLP 01/08/2021,9:37 AM

## 2021-01-08 NOTE — Progress Notes (Signed)
PROGRESS NOTE    Leon Estes  GNO:[id number] DOB: [birth year] DOA: 12/27/2020 PCP: Venia Carbon, MD    Brief Narrative:  Leon Estes was admitted to the hospital working diagnosis of severe sepsis due to [name], chronic cholecystitis.  87 year old male past medical history for coronary artery disease, COPD, chronic kidney disease, hypertension, dyslipidemia and BPH who presented with fevers and weakness.  Recent hospitalization for cholecystitis, medically treated 4/16-4/19/2022.  At home patient had recurrent fevers and diaphoresis that prompted him to come back to the hospital.  On his initial physical examination his blood pressure was 142/88, heart rate 96, respiratory rate 16, temperature 103.1 F rectally oxygenation 90% on room air.  His lungs are clear to auscultation bilaterally, heart S1-S2, present, rhythmic, abdomen soft nontender, no lower extremity edema.  Sodium 139, potassium 4.1, chloride 106, bicarb 24, glucose 133, BUN 22, creatinine 1.48, lactic acid 3.2, white count 15.3, hemoglobin 12.9, hematocrit 42.1, platelets 238. SARS COVID-19 negative.  Urinalysis specific gravity 1.039, 0-5 white cells > 300 protein.  Stool positive for C. Difficile.  CT of the abdomen and pelvis with prostatomegaly, stable appearance of gallbladder with mild, stable central intrahepatic biliary dilatation.    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  Severe sepsis due to C. difficile  colitis. Patient with poor oral intake, positive abdominal pain, no nausea or vomiting.  Wbc continue to be elevated at 13,3   Continue medical therapy with rectal vancomycin and IV metronidazole.  Follow up cell count and temperature curve. Check abdominal film to rule out developing ileus.  Advance diet as tolerated, TPN will be reduced in 50%.  Discontinue pantoprazole in the setting of C diff.   2. AKI with Hypokalemia/ hypernatremia/ hypomagnesemia/ non anion gap metabolic acidosis. Renal function with serum cr at 0,66 with K at 2,9 and serum bicarbonate at 24, Mg at 1,7 .   Add 40 meq Kcl IV x2, Mag 2 g, and continue hydration with balanced electrolyte solutions with dextrose.   3. Acute metabolic encephalopathy, delirium patient very weak and deconditioned, poor oral intake.  No confusion or agitation.   4. HTN/ dyslipidemia continue close blood pressure monitoring.  Continue with symvastatin.   5, COPD Continue oxymetry monitoring.   6. Paroxysmal atrial fibrillation. Rate controlled, will hold on full anticoagulation due to risk of worsening colitis and risk of GI bleeding.  Continue metoprolol.     Patient continue to be at high risk for worsening colitis.   Status is: Inpatient  Remains inpatient appropriate because:IV treatments appropriate due to intensity of illness or inability to take PO   Dispo:  Patient From: Home  Planned Disposition: [place]  Medically stable for discharge: No     DVT prophylaxis: Enoxaparin   Code Status:   DNR   Family Communication:  I spoke with patient's son at the bedside, we talked in detail about patient's condition, plan of care and prognosis and all questions  were addressed.      Nutrition Status: Nutrition Problem: Increased nutrient needs Etiology: acute illness,post-op healing Signs/Symptoms: estimated needs Interventions: TPN     Consultants:   Surgery    Antimicrobials:   Vancomycin and  metronidazole     Subjective: Patient very weak and deconditioned, no nausea or vomiting, no chest pain, positive abdominal pain, very poor oral intake.   Objective: Vitals:   01/08/21 0438 01/08/21 0600 01/08/21 0732 01/08/21 1328  BP: (!) 142/73   (!) 141/73  Pulse: 96   74  Resp: 20   16  Temp: (!) 97.4 F (36.3 C)   98.6 F (37 C)  TempSrc: Oral   Oral  SpO2: 97%  96% 97%  Weight:  73.6 kg    Height:        Intake/Output Summary (Last 24 hours) at 01/08/2021 1447 Last data filed at 01/08/2021 0500 Gross per 24 hour  Intake 1139.97 ml  Output 2200 ml  Net -1060.03 ml   Filed Weights   01/04/21 0349 01/07/21 0500 01/08/21 0600  Weight: 74.3 kg 73.1 kg 73.6 kg    Examination:   General: Not in pain or dyspnea, deconditioned  Neurology: Awake and alert, non focal  E ENT: no pallor, no icterus, oral mucosa moist Cardiovascular: No JVD. S1-S2 present, rhythmic, no gallops, rubs, or murmurs. No lower extremity edema. Pulmonary: vesicular breath sounds bilaterally, adequate air movement, no wheezing, rhonchi or rales. Gastrointestinal. Abdomen mild distended, firm, not tender to superficial palpation. Skin. No rashes Musculoskeletal: no joint deformities     Data Reviewed: I have personally reviewed following labs and imaging studies  CBC: Recent Labs  Lab 01/02/21 1103 01/03/21 0240 01/04/21 0242 01/05/21 0240 01/06/21 0250 01/07/21 0546 01/08/21 0310  WBC 8.6 8.7 10.8* 12.8* 11.1* 16.7* 13.3*  NEUTROABS 7.3 7.3 8.8* 10.4* 9.0*  --   --   HGB 10.6* 10.1* 10.5* 10.6* 10.6* 10.5* 10.3*  HCT 33.5* 32.0* 33.2* 32.5* 33.3* 31.7* 31.4*  MCV 89.6 89.6 88.5 87.1 87.6 85.7 86.0  PLT 265 245 248 250 230 232 268   Basic Metabolic Panel: Recent Labs  Lab 01/05/21 0240 01/06/21 0250 01/06/21 1841 01/07/21 0213 01/07/21 0546 01/07/21 1430 01/08/21 0310  NA 138 140 136 139 138 138 139  K 2.9* 2.8* 3.0* 2.9* 2.8* 3.2* 2.9*  CL 115* 113* 109 112* 110 112* 111   CO2 17* 19* 22 20* 22 22 24   GLUCOSE 150* 138* 231* 144* 149* 127* 119*  BUN 31* 23 19 19 20 20 19   CREATININE 0.77 0.74 0.84 0.69 0.68 0.83 0.66  CALCIUM 8.2* 8.2* 8.0* 8.1* 8.1* 7.8* 7.8*  MG 1.9 1.8  --  1.8 1.9  --  1.7  PHOS  --  2.1*  --  2.4* 2.4*  --  2.4*   GFR: Estimated Creatinine Clearance: 60.8 mL/min (by C-G formula based on SCr of 0.66 mg/dL). Liver Function Tests: Recent Labs  Lab 01/02/21 1103 01/03/21 0240 01/04/21 0242 01/05/21 0240 01/06/21 0250  AST 45* 35 27 23 23   ALT 54* 48* 41 37 34  ALKPHOS 55 50 47 41 39  BILITOT 0.6 0.5 0.8 0.4 0.5  PROT 6.1* 5.3* 5.4* 5.0* 5.1*  ALBUMIN 3.1* 2.9* 2.9* 2.6* 2.6*   No results for input(s): LIPASE, AMYLASE in the last 168 hours. No results for input(s): AMMONIA in the last 168 hours. Coagulation Profile: No results for input(s): INR, PROTIME in the last 168 hours. Cardiac Enzymes: No results for input(s):  CKTOTAL, CKMB, CKMBINDEX, TROPONINI in the last 168 hours. BNP (last 3 results) No results for input(s): PROBNP in the last 8760 hours. HbA1C: Recent Labs    01/06/21 0250  HGBA1C 6.5*   CBG: Recent Labs  Lab 01/07/21 1216 01/07/21 1706 01/07/21 2337 01/08/21 0551 01/08/21 1309  GLUCAP 141* 106* 143* 141* 175*   Lipid Profile: Recent Labs    01/06/21 0250  TRIG 85   Thyroid Function Tests: No results for input(s): TSH, T4TOTAL, FREET4, T3FREE, THYROIDAB in the last 72 hours. Anemia Panel: No results for input(s): VITAMINB12, FOLATE, FERRITIN, TIBC, IRON, RETICCTPCT in the last 72 hours.    Radiology Studies: I have reviewed all of the imaging during this hospital visit personally     Scheduled Meds: . aspirin EC  81 mg Oral Daily  . budesonide (PULMICORT) nebulizer solution  0.5 mg Nebulization BID  . Chlorhexidine Gluconate Cloth  6 each Topical Daily  . enoxaparin (LOVENOX) injection  70 mg Subcutaneous Daily  . insulin aspart  0-9 Units Subcutaneous Q6H  . ipratropium-albuterol   3 mL Nebulization BID  . mouth rinse  15 mL Mouth Rinse BID  . metoprolol tartrate  25 mg Oral Q6H  . pantoprazole  40 mg Oral Daily  . simvastatin  40 mg Oral QPM  . sodium chloride flush  10-40 mL Intracatheter Q12H  . sodium chloride flush  3 mL Intravenous Q12H  . tamsulosin  0.4 mg Oral Daily  . vancomycin (VANCOCIN) rectal ENEMA  500 mg Rectal Q6H   Continuous Infusions: . sodium chloride 10 mL/hr at 01/07/21 1419  . dextrose 5 % with kcl 20 mL/hr at 01/07/21 1707  . dextrose    . metronidazole 500 mg (01/08/21 1325)  . potassium PHOSPHATE IVPB (in mmol) 30 mmol (01/08/21 1124)  . TPN ADULT (ION) 60 mL/hr at 01/07/21 1818  . TPN ADULT (ION)       LOS: 12 days        Lorilyn Laitinen Gerome Apley, MD

## 2021-01-09 DIAGNOSIS — Z515 Encounter for palliative care: Secondary | ICD-10-CM | POA: Diagnosis not present

## 2021-01-09 DIAGNOSIS — I10 Essential (primary) hypertension: Secondary | ICD-10-CM | POA: Diagnosis not present

## 2021-01-09 DIAGNOSIS — N401 Enlarged prostate with lower urinary tract symptoms: Secondary | ICD-10-CM | POA: Diagnosis not present

## 2021-01-09 DIAGNOSIS — A0472 Enterocolitis due to Clostridium difficile, not specified as recurrent: Secondary | ICD-10-CM | POA: Diagnosis not present

## 2021-01-09 DIAGNOSIS — R410 Disorientation, unspecified: Secondary | ICD-10-CM | POA: Diagnosis not present

## 2021-01-09 DIAGNOSIS — R531 Weakness: Secondary | ICD-10-CM | POA: Diagnosis not present

## 2021-01-09 DIAGNOSIS — Z7189 Other specified counseling: Secondary | ICD-10-CM | POA: Diagnosis not present

## 2021-01-09 LAB — DIFFERENTIAL
Abs Immature Granulocytes: 0.2 10*3/uL — ABNORMAL HIGH (ref 0.00–0.07)
Basophils Absolute: 0 10*3/uL (ref 0.0–0.1)
Basophils Relative: 0 %
Eosinophils Absolute: 0.1 10*3/uL (ref 0.0–0.5)
Eosinophils Relative: 1 %
Immature Granulocytes: 2 %
Lymphocytes Relative: 11 %
Lymphs Abs: 1.4 10*3/uL (ref 0.7–4.0)
Monocytes Absolute: 1 10*3/uL (ref 0.1–1.0)
Monocytes Relative: 8 %
Neutro Abs: 9.6 10*3/uL — ABNORMAL HIGH (ref 1.7–7.7)
Neutrophils Relative %: 78 %

## 2021-01-09 LAB — COMPREHENSIVE METABOLIC PANEL
ALT: 26 U/L (ref 0–44)
AST: 19 U/L (ref 15–41)
Albumin: 2.5 g/dL — ABNORMAL LOW (ref 3.5–5.0)
Alkaline Phosphatase: 37 U/L — ABNORMAL LOW (ref 38–126)
Anion gap: 5 (ref 5–15)
BUN: 16 mg/dL (ref 8–23)
CO2: 25 mmol/L (ref 22–32)
Calcium: 7.7 mg/dL — ABNORMAL LOW (ref 8.9–10.3)
Chloride: 108 mmol/L (ref 98–111)
Creatinine, Ser: 0.76 mg/dL (ref 0.61–1.24)
GFR, Estimated: >60 mL/min (ref >60)
Glucose, Bld: 146 mg/dL — ABNORMAL HIGH (ref 70–99)
Potassium: 2.8 mmol/L — ABNORMAL LOW (ref 3.5–5.1)
Sodium: 138 mmol/L (ref 135–145)
Total Bilirubin: 0.6 mg/dL (ref 0.3–1.2)
Total Protein: 5 g/dL — ABNORMAL LOW (ref 6.5–8.1)

## 2021-01-09 LAB — CBC
HCT: 32.3 % — ABNORMAL LOW (ref 39.0–52.0)
Hemoglobin: 10.4 g/dL — ABNORMAL LOW (ref 13.0–17.0)
MCH: 27.8 pg (ref 26.0–34.0)
MCHC: 32.2 g/dL (ref 30.0–36.0)
MCV: 86.4 fL (ref 80.0–100.0)
Platelets: 185 10*3/uL (ref 150–400)
RBC: 3.74 MIL/uL — ABNORMAL LOW (ref 4.22–5.81)
RDW: 15.5 % (ref 11.5–15.5)
WBC: 12.3 10*3/uL — ABNORMAL HIGH (ref 4.0–10.5)
nRBC: 0 % (ref 0.0–0.2)

## 2021-01-09 LAB — PREALBUMIN: Prealbumin: 24.4 mg/dL (ref 18–38)

## 2021-01-09 LAB — GLUCOSE, CAPILLARY
Glucose-Capillary: 131 mg/dL — ABNORMAL HIGH (ref 70–99)
Glucose-Capillary: 139 mg/dL — ABNORMAL HIGH (ref 70–99)
Glucose-Capillary: 155 mg/dL — ABNORMAL HIGH (ref 70–99)
Glucose-Capillary: 155 mg/dL — ABNORMAL HIGH (ref 70–99)

## 2021-01-09 LAB — MAGNESIUM: Magnesium: 1.8 mg/dL (ref 1.7–2.4)

## 2021-01-09 LAB — TRIGLYCERIDES: Triglycerides: 107 mg/dL (ref <150)

## 2021-01-09 LAB — PHOSPHORUS: Phosphorus: 2.8 mg/dL (ref 2.5–4.6)

## 2021-01-09 LAB — POTASSIUM: Potassium: 3.1 mmol/L — ABNORMAL LOW (ref 3.5–5.1)

## 2021-01-09 MED ORDER — METRONIDAZOLE 500 MG/100ML IV SOLN
500.0000 mg | Freq: Three times a day (TID) | INTRAVENOUS | Status: DC
  Administered 2021-01-09 – 2021-01-19 (×30): 500 mg via INTRAVENOUS
  Filled 2021-01-09 (×30): qty 100

## 2021-01-09 MED ORDER — VANCOMYCIN HCL 250 MG PO CAPS: 500.0000 mg | ORAL_CAPSULE | Freq: Four times a day (QID) | ORAL | Status: DC

## 2021-01-09 MED ORDER — VANCOMYCIN HCL 500 MG IV SOLR
500.0000 mg | Freq: Four times a day (QID) | Status: DC
  Filled 2021-01-09: qty 500

## 2021-01-09 MED ORDER — VANCOMYCIN 50 MG/ML ORAL SOLUTION
500.0000 mg | Freq: Four times a day (QID) | ORAL | Status: DC
  Administered 2021-01-09 – 2021-01-19 (×39): 500 mg via ORAL
  Filled 2021-01-09 (×44): qty 10

## 2021-01-09 MED ORDER — POTASSIUM CHLORIDE 10 MEQ/100ML IV SOLN
10.0000 meq | INTRAVENOUS | Status: AC
  Administered 2021-01-09 (×6): 10 meq via INTRAVENOUS
  Filled 2021-01-09 (×6): qty 100

## 2021-01-09 MED ORDER — FLUTICASONE FUROATE-VILANTEROL 200-25 MCG/INH IN AEPB
1.0000 | INHALATION_SPRAY | Freq: Two times a day (BID) | RESPIRATORY_TRACT | Status: DC | PRN
  Administered 2021-01-09 – 2021-01-22 (×4): 1 via RESPIRATORY_TRACT
  Filled 2021-01-09: qty 28

## 2021-01-09 MED ORDER — MAGNESIUM SULFATE 2 GM/50ML IV SOLN
2.0000 g | Freq: Once | INTRAVENOUS | Status: AC
  Administered 2021-01-09: 2 g via INTRAVENOUS
  Filled 2021-01-09: qty 50

## 2021-01-09 MED ORDER — VANCOMYCIN HCL 500 MG IV SOLR
500.0000 mg | Freq: Four times a day (QID) | Status: DC
  Administered 2021-01-09 – 2021-01-11 (×6): 500 mg via RECTAL
  Filled 2021-01-09 (×9): qty 500

## 2021-01-09 MED ORDER — POTASSIUM CHLORIDE 10 MEQ/100ML IV SOLN
10.0000 meq | INTRAVENOUS | Status: AC
  Administered 2021-01-09 (×4): 10 meq via INTRAVENOUS
  Filled 2021-01-09 (×4): qty 100

## 2021-01-09 MED ORDER — TRAVASOL 10 % IV SOLN: INTRAVENOUS | Status: DC

## 2021-01-09 MED ORDER — TRAVASOL 10 % IV SOLN
INTRAVENOUS | Status: AC
  Filled 2021-01-09: qty 432

## 2021-01-09 MED ORDER — METRONIDAZOLE 500 MG/100ML IV SOLN: 500.0000 mg | Freq: Three times a day (TID) | INTRAVENOUS | Status: DC

## 2021-01-09 NOTE — Progress Notes (Addendum)
Daily Progress Note   Patient Name: Leon Estes       Date: 01/09/2021 DOB: 10-24-1930  Age: 87 y.o. MRN#: [id number] Attending Physician: Tawni Millers Primary Care Physician: Venia Carbon, MD Admit Date: 12/27/2020  Reason for Consultation/Follow-up: Establishing goals of care  Subjective: Medical records reviewed. Pt is lethargic. No family present at the bedside.  Called patient's wife Leon Estes to provide support. She states that she has been updated regularly and she is happy with patient's progress, especially his oral intake. She remains hopeful that he will continue to increase nutritional intake and discharge home rather than SNF. She is also happy with patient's progress with PT and OT. She reports that her adult children agree with the above goals and wishes.  Questions and concerns addressed. PMT will continue to support holistically.   Addendum: Outpatient palliative care services were explained and offered. Patient's wife states this would be fine. She understands OPS can provide patient and family with assistance both at SNF and at home. She again voices her strong preference for discharge to home.    Length of Stay: 13  Current Medications: Scheduled Meds:  . aspirin EC  81 mg Oral Daily  . budesonide (PULMICORT) nebulizer solution  0.5 mg Nebulization BID  . Chlorhexidine Gluconate Cloth  6 each Topical Daily  . enoxaparin (LOVENOX) injection  40 mg Subcutaneous Q24H  . insulin aspart  0-9 Units Subcutaneous Q6H  . ipratropium-albuterol  3 mL Nebulization BID  . mouth rinse  15 mL Mouth Rinse BID  . metoprolol tartrate  25 mg Oral Q6H  . simvastatin  40 mg Oral QPM  . sodium chloride flush  10-40 mL Intracatheter Q12H  . sodium chloride flush  3 mL  Intravenous Q12H  . tamsulosin  0.4 mg Oral Daily  . vancomycin (VANCOCIN) rectal ENEMA  500 mg Rectal Q6H    Continuous Infusions: . sodium chloride 250 mL (01/09/21 0751)  . dextrose 10 mL/hr at 01/09/21 0905  . metronidazole 500 mg (01/09/21 1232)  . potassium chloride 10 mEq (01/09/21 1339)  . TPN ADULT (ION) 40 mL/hr at 01/08/21 1900  . TPN ADULT (ION)      PRN Meds: sodium chloride, acetaminophen, albuterol, labetalol, methocarbamol, metoprolol tartrate, ondansetron (ZOFRAN) IV, sodium chloride flush  Physical Exam Vitals and nursing  note reviewed.  Constitutional:      Appearance: He is ill-appearing.     Interventions: Nasal cannula in place.  Cardiovascular:     Rate and Rhythm: Bradycardia present.  Pulmonary:     Effort: Pulmonary effort is normal.  Neurological:     Mental Status: He is lethargic.   resting in bed, less alert today ROS: unable to to obtain due to altered mental status  Vital Signs: BP (!) 147/66   Pulse (!) 53   Temp 98.2 F (36.8 C) (Oral)   Resp 14   Ht 5\' 7"  (1.702 m)   Wt 74.7 kg   SpO2 97%   BMI 25.79 kg/m  SpO2: SpO2: 97 % O2 Device: O2 Device: Room Air O2 Flow Rate: O2 Flow Rate (L/min): 2 L/min  Intake/output summary:   Intake/Output Summary (Last 24 hours) at 01/09/2021 1437 Last data filed at 01/09/2021 1400 Gross per 24 hour  Intake 3709.65 ml  Output 1225 ml  Net 2484.65 ml   LBM: Last BM Date: 01/09/21 Baseline Weight: Weight: 71.2 kg Most recent weight: Weight: 74.7 kg      Flowsheet Rows   Flowsheet Row Most Recent Value  Intake Tab   Referral Department Hospitalist  Unit at Time of Referral ICU  Palliative Care Primary Diagnosis Sepsis/Infectious Disease  Date Notified 12/30/20  Palliative Care Type New Palliative care  Reason for referral Clarify Goals of Care  Date of Admission 12/27/20  Date first seen by Palliative Care 12/31/20  # of days Palliative referral response time 1 Day(s)  # of days IP  prior to Palliative referral 3  Clinical Assessment   Palliative Performance Scale Score 20%  Psychosocial & Spiritual Assessment   Palliative Care Outcomes   Patient/Family meeting held? Yes  Who was at the meeting? Wife, son, daughter  Palliative Care Outcomes Clarified goals of care      Patient Active Problem List   Diagnosis Date Noted  . 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 Palliative care by specialist   . Goals of care, counseling/discussion   . General weakness   . Colitis due to Clostridioides difficile 12/31/2020  . Acute delirium 12/31/2020  . HOH (hard of hearing) 12/28/2020  . Acute on chronic cholecystitis 12/28/2020  . Hypokalemia 12/28/2020  . Severe sepsis ([place]) 12/27/2020  . Arrhythmia 12/06/2020  . Pulmonary nodule 12/06/2020  . Acute cholecystitis 11/26/2020  . Preventative health care 10/12/2020  . Macular degeneration, wet ([place]) 10/12/2020  . Acquired claw toe, left 02/02/2020  . BPH (benign prostatic hyperplasia) 01/13/2020  . Neuropathy 02/18/2018  . COPD (chronic obstructive pulmonary disease) ([place])   . Chronic renal disease, stage III ([place])   . Benign essential HTN 04/09/2017  . CAD (coronary artery disease) 03/03/2015  . Hyperlipidemia 03/03/2015    Palliative Care Assessment & Plan   Patient Profile: 87 y.o. male  with past medical history of CHF, COPD, choledocholithiasis, CAD status post PCI, COPD, CKD, hypertension, hyperlipidemia, BPH admitted on 12/27/2020 with sepsis.  He was recently admitted to [facility] 4/16 through 4/19 for presumed cholecystitis.  He was being followed by Dr. Redmond Pulling for cholecystectomy later this month.  Initially, there was concern that his presentation was related to cholecystitis again, however, with further work-up it appears that this is actually more from C. difficile.  His hospital course has been further complicated by A. fib, COPD exacerbation, and development of agitation/delirium.  Palliative consulted for goals of  care.  Recommendations/Plan:  Continue current interventions,  goals are clear and family remains hopeful for improvement   Psychosocial and emotional support provided  PMT will provide ongoing support  Addendum: consulted TOC team, appreciate assistance with outpatient palliative care referral.  Code Status:  DNR  Code Status Orders  (From admissi  on, onward)         Start     Ordered   12/29/20 1002  Do not attempt resuscitation (DNR)  Continuous       Question Answer Comment  In the event of cardiac or respiratory ARREST Do not call a "code blue"   In the event of cardiac or respiratory ARREST Do not perform Intubation, CPR, defibrillation or ACLS   In the event of cardiac or respiratory ARREST Use medication by any route, position, wound care, and other measures to relive pain and suffering. May use oxygen, suction and manual treatment of airway obstruction as needed for comfort.      12/29/20 1001        Code Status History    Date Active Date Inactive Code Status Order ID Comments User Context   12/27/2020 2202 12/29/2020 1001 Full Code [id number]  Lenore Cordia, MD ED   11/26/2020 1947 11/29/2020 1648 Full Code [id number]  Kayleen Memos, DO ED   Advance Care Planning Activity     Prognosis:  Guarded  Discharge Planning:  To Be Determined  Care plan was discussed with patient's wife.   Total time: 15 minutes Greater than 50% of this time was spent in counseling and coordinating care related to the above assessment and plan.  Dorthy Cooler, PA-C Palliative Medicine Team Team phone # [phone]  Thank you for allowing the Palliative Medicine Team to assist in the care of this patient. Please utilize secure chat with additional questions, if there is no response within 30 minutes please call the above phone number.  Palliative Medicine Team providers are available by phone from 7am to 7pm daily and can be reached through the team cell phone.  Should  this patient require assistance outside of these hours, please call the patient's attending physician.

## 2021-01-09 NOTE — Progress Notes (Signed)
Pharmacy: Re- potassium replacement  Patient is an 87 y.o M currently on TPN per pharmacy dosing. Pt's K+ was 2.8 this morning with 6 runs of KCL given earlier today. Potassium conc in TPN increased from 50 meq/L to 70 meq/L with new bag at 6p today.  K+ level at 6p remains low at 3.1.  Plan: - Potassium chloride 10 meq IV x4 runs now - f/u with labs in AM  Dia Sitter, PharmD, BCPS 01/09/2021 7:35 PM

## 2021-01-09 NOTE — Progress Notes (Signed)
Physical Therapy Treatment Patient Details Name: Leon Estes MRN: [id number] DOB: Oct 22, 1930 Today's Date: 01/09/2021    History of Present Illness 87 year old male with history of CAD status post stenting to RCA in 1997, COPD, CKD stage III, hypertension, hyperlipidemia, BPH, recent admission at [facility] from 11/26/2020-11/29/2020 with presumed acute cholecystitis ,presented on 12/27/2020 with fever and generalized weakness.Hospital course has been complicated by delirium/agitation along with A. fib with RVR, C Diff., abdominal distention with imaging showing concern for toxic megacolon    PT Comments    Pt agreeable to mobilize and assisted OOB to recliner.  Pt requiring min-mod assist today.  Continue to recommend SNF at this time however will update recommendations as pt progresses.   Follow Up Recommendations  SNF     Equipment Recommendations  None recommended by PT    Recommendations for Other Services       Precautions / Restrictions Precautions Precautions: Fall    Mobility  Bed Mobility Overal bed mobility: Needs Assistance Bed Mobility: Supine to Sit     Supine to sit: Min assist     General bed mobility comments: verbal cues for initiating, light trunk assist upright    Transfers Overall transfer level: Needs assistance Equipment used: Rolling walker (2 wheeled) Transfers: Sit to/from [facility] Sit to Stand: Min assist;From elevated surface Stand pivot transfers: Min guard       General transfer comment: verbal cues for hand placement, assist to rise and steady  Ambulation/Gait             General Gait Details: pt declined at this time, increased WOB with transfer to recliner; pt asking for symbicort (family states he hasn't had this since admission and takes every day at home) so RN notified; Spo2 100% on room air   Stairs             Wheelchair Mobility    Modified Rankin (Stroke Patients Only)       Balance  Overall balance assessment: Needs assistance         Standing balance support: During functional activity;Bilateral upper extremity supported Standing balance-Leahy Scale: Poor Standing balance comment: reliant on UE support                            Cognition Arousal/Alertness: Awake/alert Behavior During Therapy: WFL for tasks assessed/performed Overall Cognitive Status: Impaired/Different from baseline                   Orientation Level: Disoriented to;Situation   Memory: Decreased short-term memory         General Comments: pt follows simple commands      Exercises General Exercises - Lower Extremity Ankle Circles/Pumps: AROM;Both;10 reps;Seated Long Arc Quad: AROM;Both;10 reps;Seated Hip Flexion/Marching: AROM;Both;10 reps;Seated    General Comments        Pertinent Vitals/Pain Pain Assessment: No/denies pain    Home Living                      Prior Function            PT Goals (current goals can now be found in the care plan section) Progress towards PT goals: Progressing toward goals    Frequency    Min 3X/week      PT Plan Current plan remains appropriate    Co-evaluation              AM-PAC PT "6 Clicks"  Mobility   Outcome Measure  Help needed turning from your back to your side while in a flat bed without using bedrails?: A Little Help needed moving from lying on your back to sitting on the side of a flat bed without using bedrails?: A Little Help needed moving to and from a bed to a chair (including a wheelchair)?: A Lot Help needed standing up from a chair using your arms (e.g., wheelchair or bedside chair)?: A Lot Help needed to walk in hospital room?: A Lot Help needed climbing 3-5 steps with a railing? : A Lot 6 Click Score: 14    End of Session Equipment Utilized During Treatment: Gait belt Activity Tolerance: Patient tolerated treatment well Patient left: in chair;with call bell/phone within  reach;with family/visitor present (family present and to notify staff if leaving room) Nurse Communication: Mobility status PT Visit Diagnosis: Unsteadiness on feet (R26.81);Difficulty in walking, not elsewhere classified (R26.2)     Time: 6144-3154 PT Time Calculation (min) (ACUTE ONLY): 19 min  Charges:  $Therapeutic Activity: 8-22 mins                     Jannette Spanner PT, DPT Acute Rehabilitation Services Pager: [phone] Office: [phone]  York Ram E 01/09/2021, 2:52 PM

## 2021-01-09 NOTE — Progress Notes (Signed)
PROGRESS NOTE    Leon Estes  JJO:[id number] DOB: 11-19-1930 DOA: 12/27/2020 PCP: Venia Carbon, MD    Brief Narrative:  Mr. Deloria was admitted to the hospital with the working diagnosis of severe sepsis due to [name], chronic cholecystitis.  87 year old male past medical history for coronary artery disease, COPD, chronic kidney disease, hypertension, dyslipidemia and BPH who presented with fevers and weakness.  Recent hospitalization for cholecystitis, medically treated 4/16-4/19/2022.  At home patient had recurrent fevers and diaphoresis that prompted him to come back to the hospital.  On his initial physical examination his blood pressure was 142/88, heart rate 96, respiratory rate 16, temperature 103.1 F rectally oxygenation 90% on room air.  His lungs are clear to auscultation bilaterally, heart S1-S2, present, rhythmic, abdomen soft nontender, no lower extremity edema.  Sodium 139, potassium 4.1, chloride 106, bicarb 24, glucose 133, BUN 22, creatinine 1.48, lactic acid 3.2, white count 15.3, hemoglobin 12.9, hematocrit 42.1, platelets 238. SARS COVID-19 negative.  Urinalysis specific gravity 1.039, 0-5 white cells > 300 protein.  Stool positive for C. Difficile.  CT of the abdomen and pelvis with prostatomegaly, stable appearance of gallbladder with mild, stable central intrahepatic biliary dilatation   Assessment & Plan:   Principal Problem:   Colitis due to Clostridioides difficile Active Problems:   CAD (coronary artery disease)   Benign essential HTN   COPD (chronic obstructive pulmonary disease) (HCC)   Chronic renal disease, stage III (HCC)   BPH (benign prostatic hyperplasia)   Severe sepsis (HCC)   HOH (hard of hearing)   Acute on chronic cholecystitis   Hypokalemia   Acute delirium   Palliative care by specialist   Goals of care, counseling/discussion   General weakness   Toxic megacolon ([place])   1.  Severe sepsis due to C.  difficile colitis/ toxic megacolon. continue to be very weak and deconditioned. Persistent abdominal distention, abdominal film personally reviewed noted significant persistent colonic dilatation. Persistent watery diarrhea.  Wbc is 12.3 from 13,3.   Medical therapy with rectal vancomycin and IV metronidazole.  Continue TPN for nutrition support, patient with very poor oral intake.  If not improving will consider adding oral vancomycin to rectal vancomycin.   2. AKI with Hypokalemia/ hypernatremia/ hypomagnesemia/ non anion gap metabolic acidosis. Patient with very poor oral intake. On TPN for nutrition. K continue to bo low at 2.8, Na is 138, Mg 1,8, with stable renal function with serum cr at 0,76 and Bun 25.    Continue K correction with KCl IV and add Mg IV, Continue close follow up on electrolytes.   3. Acute metabolic encephalopathy, delirium no agitation, very weak and deconditioned.  Continue with supportive medical care.  Continue with dysphagia 2 diet with aspiration precautions.   4. HTN/ dyslipidemia holding blood pressure agents for now.   On simvastatin.   5, COPD No signs of acute exacerbation, continue with oxymetry monitoring. Continue with budesonide.   6. Paroxysmal atrial fibrillation. On metoprolol for rate control, in the setting of colitis, toxic megacolon, high risk for bleeding, will continue with only prophylactic dose of enoxaparin,.   7. T2DM. Continue glucose control and monitoring with insulin sliding scale.   Patient continue to be at high risk for worsening sepsis.   Status is: Inpatient  Remains inpatient appropriate because:IV treatments appropriate due to intensity of illness or inability to take PO   Dispo:  Patient From: Home  Planned Disposition: [place]  Medically stable for discharge:  No     DVT prophylaxis: Enoxaparin   Code Status:   DNR  Family Communication:  No family at the bedside      Nutrition  Status: Nutrition Problem: Increased nutrient needs Etiology: acute illness,post-op healing Signs/Symptoms: estimated needs Interventions: TPN    Consultants:   Surgery   Antimicrobials:   Vancomycin and metronidazole.     Subjective: Patient very weak and deconditioned, poor oral intake, continue to have abdominal distention with no nausea or vomiting. Continue to have watery diarrhea.   Objective: Vitals:   01/08/21 2023 01/08/21 2216 01/09/21 0554 01/09/21 0726  BP:  (!) 146/70 (!) 157/69   Pulse:  82 65   Resp:      Temp:  99.1 F (37.3 C) 97.9 F (36.6 C)   TempSrc:  Oral    SpO2: 98% 98% 98% 95%  Weight:   74.7 kg   Height:        Intake/Output Summary (Last 24 hours) at 01/09/2021 1113 Last data filed at 01/09/2021 0600 Gross per 24 hour  Intake 2667.81 ml  Output 800 ml  Net 1867.81 ml   Filed Weights   01/07/21 0500 01/08/21 0600 01/09/21 0554  Weight: 73.1 kg 73.6 kg 74.7 kg    Examination:   General: Not in pain or dyspnea, deconditioned and ill looking appearing  Neurology: Awake and alert, non focal  E ENT: no pallor, no icterus, oral mucosa moist Cardiovascular: No JVD. S1-S2 present, rhythmic, no gallops, rubs, or murmurs. No lower extremity edema. Pulmonary: vesicular breath sounds bilaterally, adequate air movement, no wheezing, rhonchi or rales. Gastrointestinal. Abdomen distended, tender to deep palpation. No rebound.  Skin. No rashes Musculoskeletal: no joint deformities     Data Reviewed: I have personally reviewed following labs and imaging studies  CBC: Recent Labs  Lab 01/03/21 0240 01/04/21 0242 01/05/21 0240 01/06/21 0250 01/07/21 0546 01/08/21 0310 01/09/21 0400  WBC 8.7 10.8* 12.8* 11.1* 16.7* 13.3* 12.3*  NEUTROABS 7.3 8.8* 10.4* 9.0*  --   --  9.6*  HGB 10.1* 10.5* 10.6* 10.6* 10.5* 10.3* 10.4*  HCT 32.0* 33.2* 32.5* 33.3* 31.7* 31.4* 32.3*  MCV 89.6 88.5 87.1 87.6 85.7 86.0 86.4  PLT 245 248 250 230 232 200 347    Basic Metabolic Panel: Recent Labs  Lab 01/06/21 0250 01/06/21 1841 01/07/21 0213 01/07/21 0546 01/07/21 1430 01/08/21 0310 01/08/21 1800 01/09/21 0400  NA 140   < > 139 138 138 139 138 138  K 2.8*   < > 2.9* 2.8* 3.2* 2.9* 3.2* 2.8*  CL 113*   < > 112* 110 112* 111 109 108  CO2 19*   < > 20* 22 22 24 24 25   GLUCOSE 138*   < > 144* 149* 127* 119* 130* 146*  BUN 23   < > 19 20 20 19 17 16   CREATININE 0.74   < > 0.69 0.68 0.83 0.66 0.73 0.76  CALCIUM 8.2*   < > 8.1* 8.1* 7.8* 7.8* 7.7* 7.7*  MG 1.8  --  1.8 1.9  --  1.7  --  1.8  PHOS 2.1*  --  2.4* 2.4*  --  2.4*  --  2.8   < > = values in this interval not displayed.   GFR: Estimated Creatinine Clearance: 60.8 mL/min (by C-G formula based on SCr of 0.76 mg/dL). Liver Function Tests: Recent Labs  Lab 01/03/21 0240 01/04/21 0242 01/05/21 0240 01/06/21 0250 01/09/21 0400  AST 35 27 23 23  19  ALT 48* 41 37 34 26  ALKPHOS 50 47 41 39 37*  BILITOT 0.5 0.8 0.4 0.5 0.6  PROT 5.3* 5.4* 5.0* 5.1* 5.0*  ALBUMIN 2.9* 2.9* 2.6* 2.6* 2.5*   No results for input(s): LIPASE, AMYLASE in the last 168 hours. No results for input(s): AMMONIA in the last 168 hours. Coagulation Profile: No results for input(s): INR, PROTIME in the last 168 hours. Cardiac Enzymes: No results for input(s): CKTOTAL, CKMB, CKMBINDEX, TROPONINI in the last 168 hours. BNP (last 3 results) No results for input(s): PROBNP in the last 8760 hours. HbA1C: No results for input(s): HGBA1C in the last 72 hours. CBG: Recent Labs  Lab 01/08/21 0551 01/08/21 1309 01/08/21 1823 01/08/21 2336 01/09/21 0531  GLUCAP 141* 175* 145* 154* 131*   Lipid Profile: Recent Labs    01/09/21 0400  TRIG 107   Thyroid Function Tests: No results for input(s): TSH, T4TOTAL, FREET4, T3FREE, THYROIDAB in the last 72 hours. Anemia Panel: No results for input(s): VITAMINB12, FOLATE, FERRITIN, TIBC, IRON, RETICCTPCT in the last 72 hours.    Radiology Studies: I have  reviewed all of the imaging during this hospital visit personally     Scheduled Meds: . aspirin EC  81 mg Oral Daily  . budesonide (PULMICORT) nebulizer solution  0.5 mg Nebulization BID  . Chlorhexidine Gluconate Cloth  6 each Topical Daily  . enoxaparin (LOVENOX) injection  40 mg Subcutaneous Q24H  . insulin aspart  0-9 Units Subcutaneous Q6H  . ipratropium-albuterol  3 mL Nebulization BID  . mouth rinse  15 mL Mouth Rinse BID  . metoprolol tartrate  25 mg Oral Q6H  . simvastatin  40 mg Oral QPM  . sodium chloride flush  10-40 mL Intracatheter Q12H  . sodium chloride flush  3 mL Intravenous Q12H  . tamsulosin  0.4 mg Oral Daily  . vancomycin (VANCOCIN) rectal ENEMA  500 mg Rectal Q6H   Continuous Infusions: . sodium chloride 250 mL (01/09/21 0751)  . dextrose 10 mL/hr at 01/09/21 0905  . metronidazole 500 mg (01/09/21 0438)  . potassium chloride 10 mEq (01/09/21 1024)  . TPN ADULT (ION) 40 mL/hr at 01/08/21 1900  . TPN ADULT (ION)       LOS: 13 days        Aliyana Dlugosz Gerome Apley, MD

## 2021-01-09 NOTE — Care Management Important Message (Signed)
Medicare IM given to the patient by Azaylea Maves. 

## 2021-01-09 NOTE — Progress Notes (Signed)
PHARMACY - TOTAL PARENTERAL NUTRITION CONSULT NOTE   Indication: Prolonged NPO, Cdiff  Patient Measurements: Height: 5\' 7"  (170.2 cm) Weight: 74.7 kg (164 lb 10.9 oz) IBW/kg (Calculated) : 66.1 TPN AdjBW (KG): 71.2 Body mass index is 25.79 kg/m. Usual Weight:   Assessment: 37 yoM admitted on 5/17 with fever and weakness.  PMH significant for planned outpatient lap cholecystectomy on 5/31, recent inpt admission at [facility] 4/16-4/19, CAD s/p stent, COPD, CKD, HTN, HLD, BPH.  Surgical intervention has been postponed d/t severe Cdiff and sepsis, concern for toxic megacolon.  He was briefly on CLD from 5/21-5/23, but has been otherwise NPO.    Glucose / Insulin: No hx DM or any DM medications.  SoluMedrol (5/20-5/27) - CBGs:  131-175 - sensitive SSI:  6 units/ 24 hrs Electrolytes: K low and decreased to 2.8 (despite high replacement), Mg/Phos WNL after replacement, Cl and CO2, CorrCa 8.9, others WNL.  Renal: SCr 0.66, BUN wnl Hepatic: LFTs and Tbili WNL Prealbumin: 26.5 (5/27), 24.4 (5/30) Intake / Output; MIVF: D5 KCl 40 mEq/L at 20 ml/hr. - Net I/O: + 1827mL; UOP 0.4 ml/kg/hr - Stool: x1 occurrence yesterday GI Imaging: - 5/23 CT colon is diffusely distended and fluid-filled to the rectum,concerning for toxic megacolon; trace ascites, anasarca; Prostatomegaly, CAD - 5/26 AXR:  Stable, unchanged gaseous distention of the colon; No definite small bowel dilatation is noted. GI Surgeries / Procedures:   Central access: PICC 5/26 TPN start date: 5/27  Nutritional Goals (per RD recommendation on 5/25): Kcal:  1600-1800 kcal, Protein:  70-85 grams, Fluid:  >/= 1.8 L/day Goal TPN rate is 75 mL/hr (provides 81 g of protein and 1760 kcals per day)  Current Nutrition:  TPN at 1/2 rate 5/29 cleared by speech for dysphagia 2 diet  Plan:  Now:  KCl x 6 runs ordered by MD Mag 2g IV x1 dose  At 18:00: Continue TPN at 40 mL/hr Electrolytes in TPN:   Na 6mEq/L - increase   K 50 mEq/L -    Ca 22mEq/L,   Mg 38mEq/L,  Phos 49mmol/L.   Cl:Ac - max Acetate.  Add standard MVI and trace elements to TPN CBG and Sensitive SSI q6h  MIVF plain D5 at 10 mL/hr at 1800 Recheck BMET today at 1800 to evaluate K replacement.  Monitor TPN labs on Mon/Thurs.  Peggyann Juba, PharmD, BCPS [facility] main pharmacy [phone] 01/09/2021,7:44 AM

## 2021-01-10 ENCOUNTER — Ambulatory Visit: Admit: 2021-01-10 | Payer: PPO | Admitting: General Surgery

## 2021-01-10 DIAGNOSIS — I2510 Atherosclerotic heart disease of native coronary artery without angina pectoris: Secondary | ICD-10-CM | POA: Diagnosis not present

## 2021-01-10 DIAGNOSIS — K812 Acute cholecystitis with chronic cholecystitis: Secondary | ICD-10-CM | POA: Diagnosis not present

## 2021-01-10 DIAGNOSIS — R531 Weakness: Secondary | ICD-10-CM | POA: Diagnosis not present

## 2021-01-10 DIAGNOSIS — I10 Essential (primary) hypertension: Secondary | ICD-10-CM | POA: Diagnosis not present

## 2021-01-10 DIAGNOSIS — A0472 Enterocolitis due to Clostridium difficile, not specified as recurrent: Secondary | ICD-10-CM | POA: Diagnosis not present

## 2021-01-10 LAB — GLUCOSE, CAPILLARY
Glucose-Capillary: 159 mg/dL — ABNORMAL HIGH (ref 70–99)
Glucose-Capillary: 152 mg/dL — ABNORMAL HIGH (ref 70–99)
Glucose-Capillary: 133 mg/dL — ABNORMAL HIGH (ref 70–99)

## 2021-01-10 LAB — BASIC METABOLIC PANEL
Anion gap: 4 — ABNORMAL LOW (ref 5–15)
BUN: 18 mg/dL (ref 8–23)
CO2: 25 mmol/L (ref 22–32)
Calcium: 7.5 mg/dL — ABNORMAL LOW (ref 8.9–10.3)
Chloride: 107 mmol/L (ref 98–111)
Creatinine, Ser: 0.94 mg/dL (ref 0.61–1.24)
GFR, Estimated: >60 mL/min (ref >60)
Glucose, Bld: 159 mg/dL — ABNORMAL HIGH (ref 70–99)
Potassium: 2.9 mmol/L — ABNORMAL LOW (ref 3.5–5.1)
Sodium: 136 mmol/L (ref 135–145)

## 2021-01-10 LAB — MAGNESIUM: Magnesium: 1.9 mg/dL (ref 1.7–2.4)

## 2021-01-10 SURGERY — LAPAROSCOPIC CHOLECYSTECTOMY WITH INTRAOPERATIVE CHOLANGIOGRAM
Anesthesia: General

## 2021-01-10 MED ORDER — TRAVASOL 10 % IV SOLN
INTRAVENOUS | Status: AC
  Filled 2021-01-10: qty 810

## 2021-01-10 MED ORDER — POTASSIUM CHLORIDE 10 MEQ/50ML IV SOLN
10.0000 meq | INTRAVENOUS | Status: AC
  Administered 2021-01-10 (×4): 10 meq via INTRAVENOUS
  Filled 2021-01-10 (×6): qty 50

## 2021-01-10 MED ORDER — POTASSIUM CHLORIDE 10 MEQ/50ML IV SOLN
10.0000 meq | INTRAVENOUS | Status: AC
  Administered 2021-01-10 (×2): 10 meq via INTRAVENOUS
  Filled 2021-01-10 (×2): qty 50

## 2021-01-10 NOTE — Progress Notes (Signed)
PROGRESS NOTE    Leon Estes  ERD:[id number] DOB: [birth year] DOA: 12/27/2020 PCP: Venia Carbon, MD    Brief Narrative:  Mr. Fluharty was admitted to the hospital with the working diagnosis of severe sepsis due to [name], chronic cholecystitis.  87 year old male past medical history for coronary artery disease, COPD, chronic kidney disease, hypertension, dyslipidemia and BPH who presented with fevers and weakness. Recent hospitalization for cholecystitis, medically treated 4/16-4/19/2022. At home patient had recurrent fevers and diaphoresis that prompted him to come back to the hospital. On his initial physical examination his blood pressure was 142/88, heart rate 96, respiratory rate 16, temperature 103.1 F rectally oxygenation 90% on room air. His lungs are clear to auscultation bilaterally, heart S1-S2, present, rhythmic, abdomen soft nontender, no lower extremity edema.  Sodium 139, potassium 4.1, chloride 106, bicarb 24, glucose 133, BUN 22, creatinine 1.48, lactic acid 3.2,white count 15.3, hemoglobin 12.9, hematocrit 42.1, platelets 238. SARS COVID-19 negative.  Urinalysis specific gravity 1.039, 0-5 white cells>300 protein.  Stool positive for C. Difficile.  CT of the abdomen and pelvis with prostatomegaly, stable appearance of gallbladder with mild, stable central intrahepatic biliary dilatation.  Patient has developed colonic dilatation, concerned for early toxic megacolon. Now on oral and rectal vancomycin plus IV metronidazole. Surgery has recommended medical therapy.  Patient very weak and deconditioned, poor oral intake, currently on TPN for supportive nutrition.    Assessment & Plan:   Principal Problem:   Colitis due to Clostridioides difficile Active Problems:   CAD (coronary artery disease)   Benign essential HTN   COPD (chronic obstructive pulmonary disease) (HCC)   Chronic renal disease, stage III (HCC)   BPH (benign prostatic  hyperplasia)   Severe sepsis (HCC)   HOH (hard of hearing)   Acute on chronic cholecystitis   Hypokalemia   Acute delirium   Palliative care by specialist   Goals of care, counseling/discussion   General weakness   Toxic megacolon ([place])    1.Severe sepsis due to [name]/ toxic megacolon. Patient is looking better today, more awake and reactive, his wife is at the bedside and his po intake has improved.   Continue medical therapy with oral and rectal Vancomycin for now, plus IV metronidazole. Follow wbc in am.   If continues to improve will start to wean off TPN tomorrow with plan to discontinue on 06/02. Continue to encourage out of bed to chair, continue pT and oT.   2. AKI with Hypokalemia/ hypernatremia/ hypomagnesemia/ non anion gap metabolic acidosis.  Serum cr today is 0,94 with K at 3,9 and bicarbonate at 25.  Persistent hypokalemia due to GI losses. Continue aggressive repletion with Kcl Iv, 60 meq today. Follow up renal function in am, avoid hypotension and nephrotoxic medications.   3. Acute metabolic encephalopathy, delirium Encephalopathy and delirium have resolved, patient is awake and alert wit hno confusion or agitation. His wife is at the bedside  Plan to continue with dysphagia 2 diet with aspiration precautions.   4. HTN/ dyslipidemiano blood pressure medications for now.    Continue with simvastatin.   5, COPD No clinical signs of acute exacerbation. On oxymetry monitoring. On budesonide.   6. Paroxysmal atrial fibrillation. Continue rate control with metoprolol, holding anticoagulation in the setting of severe C diff with high risk of GI bleeding. Continue with telemetry monitoring for now.  7. T2DM. Improved po intake. Fasting glucose this am is 159, will continue sliding scale insulin for glucose cover and monitoring.  Patient continue to be at high risk for worsening C diff.   Status is: Inpatient  Remains  inpatient appropriate because:IV treatments appropriate due to intensity of illness or inability to take PO   Dispo:  Patient From: Home  Planned Disposition: [place]  Medically stable for discharge: No     DVT prophylaxis: Enoxaparin   Code Status:   DNR   Family Communication:  I spoke with patient's wife at the bedside, we talked in detail about patient's condition, plan of care and prognosis and all questions were addressed.      Nutrition Status: Nutrition Problem: Increased nutrient needs Etiology: acute illness,post-op healing Signs/Symptoms: estimated needs Interventions: TPN     Consultants:   Surgery   Antimicrobials:   Enteral vancomycin and IV metronidazole     Subjective: Patient is feeling better today, but continue to be very weak and deconditioned,. Continue to have abdominal distention, but no nausea or vomiting.   Objective: Vitals:   01/10/21 0500 01/10/21 0507 01/10/21 0925 01/10/21 1234  BP:    140/73  Pulse:  79  80  Resp:    20  Temp:    97.6 F (36.4 C)  TempSrc:    Oral  SpO2:   97% 98%  Weight: 76.2 kg     Height:        Intake/Output Summary (Last 24 hours) at 01/10/2021 1238 Last data filed at 01/10/2021 0600 Gross per 24 hour  Intake 2086.34 ml  Output 300 ml  Net 1786.34 ml   Filed Weights   01/08/21 0600 01/09/21 0554 01/10/21 0500  Weight: 73.6 kg 74.7 kg 76.2 kg    Examination:   General: Not in pain or dyspnea, deconditioned  Neurology: Awake and alert, non focal  E ENT: no pallor, no icterus, oral mucosa moist Cardiovascular: No JVD. S1-S2 present, rhythmic, no gallops, rubs, or murmurs. No lower extremity edema. Pulmonary: positive breath sounds bilaterally, adequate air movement, no wheezing, rhonchi or rales. Gastrointestinal. Abdomen distended but not tender, soft Skin. No rashes Musculoskeletal: no joint deformities     Data Reviewed: I have personally reviewed following labs and imaging  studies  CBC: Recent Labs  Lab 01/04/21 0242 01/05/21 0240 01/06/21 0250 01/07/21 0546 01/08/21 0310 01/09/21 0400  WBC 10.8* 12.8* 11.1* 16.7* 13.3* 12.3*  NEUTROABS 8.8* 10.4* 9.0*  --   --  9.6*  HGB 10.5* 10.6* 10.6* 10.5* 10.3* 10.4*  HCT 33.2* 32.5* 33.3* 31.7* 31.4* 32.3*  MCV 88.5 87.1 87.6 85.7 86.0 86.4  PLT 248 250 230 232 200 035   Basic Metabolic Panel: Recent Labs  Lab 01/06/21 0250 01/06/21 1841 01/07/21 0213 01/07/21 0546 01/07/21 1430 01/08/21 0310 01/08/21 1800 01/09/21 0400 01/09/21 1818 01/10/21 0436  NA 140   < > 139 138 138 139 138 138  --  136  K 2.8*   < > 2.9* 2.8* 3.2* 2.9* 3.2* 2.8* 3.1* 2.9*  CL 113*   < > 112* 110 112* 111 109 108  --  107  CO2 19*   < > 20* 22 22 24 24 25   --  25  GLUCOSE 138*   < > 144* 149* 127* 119* 130* 146*  --  159*  BUN 23   < > 19 20 20 19 17 16   --  18  CREATININE 0.74   < > 0.69 0.68 0.83 0.66 0.73 0.76  --  0.94  CALCIUM 8.2*   < > 8.1* 8.1* 7.8* 7.8* 7.7* 7.7*  --  7.5*  MG 1.8  --  1.8 1.9  --  1.7  --  1.8  --  1.9  PHOS 2.1*  --  2.4* 2.4*  --  2.4*  --  2.8  --   --    < > = values in this interval not displayed.   GFR: Estimated Creatinine Clearance: 51.8 mL/min (by C-G formula based on SCr of 0.94 mg/dL). Liver Function Tests: Recent Labs  Lab 01/04/21 0242 01/05/21 0240 01/06/21 0250 01/09/21 0400  AST 27 23 23 19   ALT 41 37 34 26  ALKPHOS 47 41 39 37*  BILITOT 0.8 0.4 0.5 0.6  PROT 5.4* 5.0* 5.1* 5.0*  ALBUMIN 2.9* 2.6* 2.6* 2.5*   No results for input(s): LIPASE, AMYLASE in the last 168 hours. No results for input(s): AMMONIA in the last 168 hours. Coagulation Profile: No results for input(s): INR, PROTIME in the last 168 hours. Cardiac Enzymes: No results for input(s): CKTOTAL, CKMB, CKMBINDEX, TROPONINI in the last 168 hours. BNP (last 3 results) No results for input(s): PROBNP in the last 8760 hours. HbA1C: No results for input(s): HGBA1C in the last 72 hours. CBG: Recent Labs   Lab 01/09/21 1159 01/09/21 1756 01/09/21 2353 01/10/21 0502 01/10/21 1235  GLUCAP 155* 155* 139* 159* 152*   Lipid Profile: Recent Labs    01/09/21 0400  TRIG 107   Thyroid Function Tests: No results for input(s): TSH, T4TOTAL, FREET4, T3FREE, THYROIDAB in the last 72 hours. Anemia Panel: No results for input(s): VITAMINB12, FOLATE, FERRITIN, TIBC, IRON, RETICCTPCT in the last 72 hours.    Radiology Studies: I have reviewed all of the imaging during this hospital visit personally     Scheduled Meds: . aspirin EC  81 mg Oral Daily  . budesonide (PULMICORT) nebulizer solution  0.5 mg Nebulization BID  . Chlorhexidine Gluconate Cloth  6 each Topical Daily  . enoxaparin (LOVENOX) injection  40 mg Subcutaneous Q24H  . insulin aspart  0-9 Units Subcutaneous Q6H  . ipratropium-albuterol  3 mL Nebulization BID  . mouth rinse  15 mL Mouth Rinse BID  . metoprolol tartrate  25 mg Oral Q6H  . simvastatin  40 mg Oral QPM  . sodium chloride flush  10-40 mL Intracatheter Q12H  . sodium chloride flush  3 mL Intravenous Q12H  . tamsulosin  0.4 mg Oral Daily  . vancomycin  500 mg Oral Q6H  . vancomycin (VANCOCIN) rectal ENEMA  500 mg Rectal Q6H   Continuous Infusions: . sodium chloride 250 mL (01/09/21 0751)  . dextrose 10 mL/hr at 01/09/21 0905  . metronidazole 500 mg (01/10/21 0511)  . potassium chloride 10 mEq (01/10/21 1226)  . TPN ADULT (ION) 40 mL/hr at 01/09/21 1817  . TPN ADULT (ION)       LOS: 14 days        Kemari Mares Gerome Apley, MD

## 2021-01-10 NOTE — TOC Progression Note (Signed)
Transition of Care [facility]) - Progression Note    Patient Details  Name: Leon Estes MRN: [id number] Date of Birth: [birth year]  Transition of Care [facility]) CM/SW Contact  Enrique Weiss, Juliann Pulse, RN Phone Number: 01/10/2021, 9:51 AM  Clinical Narrative: Spouse declined SNF-wants [facility] unable to [place] to respond-continue to check on [facility] agency to accept. [facility]-rep Zach aware & wll deliver to rm prior d/c. Family to transport home on own.     Expected Discharge Plan: [place] Barriers to Discharge: Continued Medical Work up  Expected Discharge Plan and Services Expected Discharge Plan: [place]   Discharge Planning Services: CM Consult Post Acute Care Choice: [place] arrangements for the past 2 months: Single Family Home                 DME Arranged: 3-N-1,Shower stool DME Agency: [facility] Date DME Agency Contacted: 01/10/21 Time DME Agency Contacted: [phone] Representative spoke with at DME Agency: [place] Determinants of Health ([place]) Interventions    Readmission Risk Interventions No flowsheet data found.

## 2021-01-10 NOTE — TOC Progression Note (Addendum)
Transition of Care [facility]) - Progression Note    Patient Details  Name: CHAMP KEETCH MRN: [id number] Date of Birth: 1930-11-22  Transition of Care [facility]) CM/SW Contact  Ross Ludwig, [place] Phone Number: 01/10/2021, 10:16 AM  Clinical Narrative:     [facility] unable to accept patient due to staffing issues. CSW spoke to [place] at [place], they can accept patient for home health PT, OT, and social work.   Expected Discharge Plan: [place] Barriers to Discharge: Continued Medical Work up  Expected Discharge Plan and Services Expected Discharge Plan: [place]   Discharge Planning Services: CM Consult Post Acute Care Choice: [place] arrangements for the past 2 months: Single Family Home                 DME Arranged: 3-N-1,Shower stool DME Agency: [facility] Date DME Agency Contacted: 01/10/21 Time DME Agency Contacted: [phone] Representative spoke with at DME Agency: [place] Determinants of Health ([place]) Interventions    Readmission Risk Interventions No flowsheet data found.

## 2021-01-10 NOTE — Progress Notes (Signed)
PHARMACY - TOTAL PARENTERAL NUTRITION CONSULT NOTE   Indication: Prolonged NPO, Cdiff  Patient Measurements: Height: 5\' 7"  (170.2 cm) Weight: 76.2 kg (167 lb 15.9 oz) IBW/kg (Calculated) : 66.1 TPN AdjBW (KG): 71.2 Body mass index is 26.31 kg/m. Usual Weight:   Assessment: 74 yoM admitted on 5/17 with fever and weakness.  PMH significant for planned outpatient lap cholecystectomy on 5/31, recent inpt admission at [facility] 4/16-4/19, CAD s/p stent, COPD, CKD, HTN, HLD, BPH.  Surgical intervention has been postponed d/t severe Cdiff and sepsis, concern for toxic megacolon.  He was briefly on CLD from 5/21-5/23, but has been otherwise NPO.    Glucose / Insulin: No hx DM or any DM medications.  SoluMedrol (5/20-5/27) - CBGs:  139-159 - sensitive SSI:  8 units/ 24 hrs Electrolytes: K low and decreased to 2.9 (despite high replacement - 118meq outside of TPN + increase in TPN to 27meq/L), Mg/Phos WNL after replacement, Cl and CO2 WNL; CorrCa 8.7 slightly low.  Renal: SCr 0.94, BUN wnl Hepatic: LFTs and Tbili WNL on 5/30 Prealbumin: 26.5 (5/27), 24.4 (5/30) Intake / Output; MIVF: D5 at 97ml/hr - Net I/O: + 1627mL; UOP 0.4 ml/kg/hr - Stool: x3 occurrence yesterday GI Imaging: - 5/23 CT colon is diffusely distended and fluid-filled to the rectum,concerning for toxic megacolon; trace ascites, anasarca; Prostatomegaly, CAD - 5/26 AXR:  Stable, unchanged gaseous distention of the colon; No definite small bowel dilatation is noted. - 5/29 AXR: Diffuse gaseous distension of the colon similar to prior Radiograph. Crescentic lucency along the bowel wall in the right lower quadrant may be within the bowel lumen or represent pneumatosis. GI Surgeries / Procedures:   Central access: PICC 5/26 TPN start date: 5/27  Nutritional Goals (per RD recommendation on 5/25): Kcal:  1600-1800 kcal, Protein:  70-85 grams, Fluid:  >/= 1.8 L/day Goal TPN rate is 75 mL/hr (provides 81 g of protein and 1760 kcals per  day)  Current Nutrition:  TPN at 1/2 rate 5/29 cleared by speech for dysphagia 2 diet  Plan:  Now:  KCl x 6 runs  Recheck K+ at 1800 and supplement more if needed  At 18:00: Continue TPN, increase TPN back to full support 75 mL/hr Electrolytes in TPN:   Na 66mEq/L   K 80 mEq/L - increase  Ca 72mEq/L,   Mg 90mEq/L   Phos 59mmol/L.   Cl:Ac - max Acetate.  Add standard MVI and trace elements to TPN CBG and Sensitive SSI q6h  MIVF plain D5 at 10 mL/hr at 1800 Recheck BMET today at 1800 to re-evaluate K replacement.  Monitor TPN labs on Mon/Thurs, BMET/Mg in AM  Peggyann Juba, PharmD, BCPS [facility] main pharmacy [phone] 01/10/2021,7:21 AM

## 2021-01-11 DIAGNOSIS — R0603 Acute respiratory distress: Secondary | ICD-10-CM | POA: Diagnosis not present

## 2021-01-11 DIAGNOSIS — R410 Disorientation, unspecified: Secondary | ICD-10-CM | POA: Diagnosis not present

## 2021-01-11 DIAGNOSIS — N401 Enlarged prostate with lower urinary tract symptoms: Secondary | ICD-10-CM | POA: Diagnosis not present

## 2021-01-11 DIAGNOSIS — I10 Essential (primary) hypertension: Secondary | ICD-10-CM | POA: Diagnosis not present

## 2021-01-11 DIAGNOSIS — Z515 Encounter for palliative care: Secondary | ICD-10-CM | POA: Diagnosis not present

## 2021-01-11 DIAGNOSIS — Z7189 Other specified counseling: Secondary | ICD-10-CM | POA: Diagnosis not present

## 2021-01-11 DIAGNOSIS — A0472 Enterocolitis due to Clostridium difficile, not specified as recurrent: Secondary | ICD-10-CM | POA: Diagnosis not present

## 2021-01-11 LAB — BASIC METABOLIC PANEL
Anion gap: 4 — ABNORMAL LOW (ref 5–15)
Anion gap: 4 — ABNORMAL LOW (ref 5–15)
BUN: 22 mg/dL (ref 8–23)
BUN: 21 mg/dL (ref 8–23)
CO2: 26 mmol/L (ref 22–32)
CO2: 26 mmol/L (ref 22–32)
Calcium: 7.5 mg/dL — ABNORMAL LOW (ref 8.9–10.3)
Calcium: 7.6 mg/dL — ABNORMAL LOW (ref 8.9–10.3)
Chloride: 107 mmol/L (ref 98–111)
Chloride: 107 mmol/L (ref 98–111)
Creatinine, Ser: 0.85 mg/dL (ref 0.61–1.24)
Creatinine, Ser: 0.82 mg/dL (ref 0.61–1.24)
GFR, Estimated: >60 mL/min (ref >60)
GFR, Estimated: >60 mL/min (ref >60)
Glucose, Bld: 202 mg/dL — ABNORMAL HIGH (ref 70–99)
Glucose, Bld: 143 mg/dL — ABNORMAL HIGH (ref 70–99)
Potassium: 2.7 mmol/L — CL (ref 3.5–5.1)
Potassium: 2.8 mmol/L — ABNORMAL LOW (ref 3.5–5.1)
Sodium: 137 mmol/L (ref 135–145)
Sodium: 137 mmol/L (ref 135–145)

## 2021-01-11 LAB — CBC WITH DIFFERENTIAL/PLATELET
Abs Immature Granulocytes: 0.1 10*3/uL — ABNORMAL HIGH (ref 0.00–0.07)
Basophils Absolute: 0 10*3/uL (ref 0.0–0.1)
Basophils Relative: 0 %
Eosinophils Absolute: 0.1 10*3/uL (ref 0.0–0.5)
Eosinophils Relative: 1 %
HCT: 28.1 % — ABNORMAL LOW (ref 39.0–52.0)
Hemoglobin: 9 g/dL — ABNORMAL LOW (ref 13.0–17.0)
Immature Granulocytes: 1 %
Lymphocytes Relative: 18 %
Lymphs Abs: 1.3 10*3/uL (ref 0.7–4.0)
MCH: 28.4 pg (ref 26.0–34.0)
MCHC: 32 g/dL (ref 30.0–36.0)
MCV: 88.6 fL (ref 80.0–100.0)
Monocytes Absolute: 0.7 10*3/uL (ref 0.1–1.0)
Monocytes Relative: 10 %
Neutro Abs: 4.8 10*3/uL (ref 1.7–7.7)
Neutrophils Relative %: 70 %
Platelets: 175 10*3/uL (ref 150–400)
RBC: 3.17 MIL/uL — ABNORMAL LOW (ref 4.22–5.81)
RDW: 16.2 % — ABNORMAL HIGH (ref 11.5–15.5)
WBC: 6.9 10*3/uL (ref 4.0–10.5)
nRBC: 0 % (ref 0.0–0.2)

## 2021-01-11 LAB — GLUCOSE, CAPILLARY
Glucose-Capillary: 135 mg/dL — ABNORMAL HIGH (ref 70–99)
Glucose-Capillary: 139 mg/dL — ABNORMAL HIGH (ref 70–99)
Glucose-Capillary: 174 mg/dL — ABNORMAL HIGH (ref 70–99)
Glucose-Capillary: 176 mg/dL — ABNORMAL HIGH (ref 70–99)
Glucose-Capillary: 178 mg/dL — ABNORMAL HIGH (ref 70–99)

## 2021-01-11 LAB — PHOSPHORUS: Phosphorus: 2.2 mg/dL — ABNORMAL LOW (ref 2.5–4.6)

## 2021-01-11 LAB — MAGNESIUM: Magnesium: 1.9 mg/dL (ref 1.7–2.4)

## 2021-01-11 MED ORDER — POTASSIUM CHLORIDE 20 MEQ PO PACK: 20.0000 meq | PACK | Freq: Three times a day (TID) | ORAL | Status: DC

## 2021-01-11 MED ORDER — TRAVASOL 10 % IV SOLN
INTRAVENOUS | Status: AC
  Filled 2021-01-11: qty 480

## 2021-01-11 MED ORDER — INSULIN ASPART 100 UNIT/ML IJ SOLN
0.0000 [IU] | Freq: Three times a day (TID) | INTRAMUSCULAR | Status: DC
  Administered 2021-01-11: 3 [IU] via SUBCUTANEOUS
  Administered 2021-01-11 – 2021-01-12 (×2): 2 [IU] via SUBCUTANEOUS

## 2021-01-11 MED ORDER — POTASSIUM PHOSPHATES 15 MMOLE/5ML IV SOLN
30.0000 mmol | Freq: Once | INTRAVENOUS | Status: AC
  Administered 2021-01-11: 30 mmol via INTRAVENOUS
  Filled 2021-01-11: qty 10

## 2021-01-11 MED ORDER — POTASSIUM CHLORIDE CRYS ER 20 MEQ PO TBCR
30.0000 meq | EXTENDED_RELEASE_TABLET | Freq: Three times a day (TID) | ORAL | Status: AC
  Administered 2021-01-11 (×2): 30 meq via ORAL
  Filled 2021-01-11 (×2): qty 1

## 2021-01-11 MED ORDER — MAGNESIUM SULFATE 2 GM/50ML IV SOLN
2.0000 g | Freq: Once | INTRAVENOUS | Status: AC
  Administered 2021-01-11: 2 g via INTRAVENOUS
  Filled 2021-01-11: qty 50

## 2021-01-11 MED ORDER — POTASSIUM CHLORIDE CRYS ER 20 MEQ PO TBCR
30.0000 meq | EXTENDED_RELEASE_TABLET | ORAL | Status: DC
  Administered 2021-01-11: 30 meq via ORAL
  Filled 2021-01-11: qty 1

## 2021-01-11 MED ORDER — SODIUM CHLORIDE 0.9 % IV BOLUS
500.0000 mL | Freq: Once | INTRAVENOUS | Status: AC
  Administered 2021-01-11: 500 mL via INTRAVENOUS

## 2021-01-11 NOTE — Progress Notes (Signed)
OT Cancellation Note  Patient Details Name: Leon Estes MRN: [id number] DOB: 02/23/1931   Cancelled Treatment:    Reason Eval/Treat Not Completed: Medical issues which prohibited therapy. Patient has critically low potassium at 2.7. Has yet to receive potassium replacement. Will hold for now and f/u as able.  Agata Lucente L Tosha Belgarde 01/11/2021, 1:58 PM

## 2021-01-11 NOTE — Progress Notes (Signed)
Physical Therapy Treatment Patient Details Name: Leon Estes MRN: [id number] DOB: [birth year] Today's Date: 01/11/2021    History of Present Illness 87 year old male with history of CAD status post stenting to RCA in 1997, COPD, CKD stage III, hypertension, hyperlipidemia, BPH, recent admission at [facility] from 11/26/2020-11/29/2020 with presumed acute cholecystitis ,presented on 12/27/2020 with fever and generalized weakness.Hospital course has been complicated by delirium/agitation along with A. fib with RVR, C Diff., abdominal distention with imaging showing concern for toxic megacolon    PT Comments    Pt ambulated 15' x 2 with RW, no loss of balance. Distance limited by fatigue.  Instructed pt in seated BLE strengthening exercises. He put forth good effort.   Follow Up Recommendations  SNF     Equipment Recommendations  None recommended by PT    Recommendations for Other Services       Precautions / Restrictions Precautions Precautions: Fall Precaution Comments: wife reports 2-3 recent falls Restrictions Weight Bearing Restrictions: No    Mobility  Bed Mobility Overal bed mobility: Needs Assistance Bed Mobility: Supine to Sit     Supine to sit: Min assist     General bed mobility comments: verbal cues for initiating, light trunk assist    Transfers Overall transfer level: Needs assistance Equipment used: Rolling walker (2 wheeled) Transfers: Sit to/from Stand Sit to Stand: Min assist         General transfer comment: verbal cues for hand placement, assist to rise and steady  Ambulation/Gait Ambulation/Gait assistance: Min guard Gait Distance (Feet): 30 Feet Assistive device: Rolling walker (2 wheeled) Gait Pattern/deviations: Step-through pattern;Trunk flexed;Decreased stride length Gait velocity: decr   General Gait Details: pt ambulated to commode then to recliner, VCs for posture and positioning in RW   Stairs             Wheelchair Mobility     Modified Rankin (Stroke Patients Only)       Balance Overall balance assessment: Needs assistance   Sitting balance-Leahy Scale: Fair     Standing balance support: During functional activity;Bilateral upper extremity supported Standing balance-Leahy Scale: Poor Standing balance comment: reliant on UE support                            Cognition Arousal/Alertness: Awake/alert Behavior During Therapy: WFL for tasks assessed/performed Overall Cognitive Status: Impaired/Different from baseline                   Orientation Level: Disoriented to;Situation   Memory: Decreased short-term memory         General Comments: pt follows simple commands      Exercises General Exercises - Lower Extremity Ankle Circles/Pumps: AROM;Both;10 reps;Seated Long Arc Quad: AROM;Both;10 reps;Seated Hip Flexion/Marching: AROM;Both;10 reps;Seated    General Comments        Pertinent Vitals/Pain Pain Assessment: No/denies pain    Home Living                      Prior Function            PT Goals (current goals can now be found in the care plan section) Acute Rehab PT Goals Patient Stated Goal: ready to get out of bed, go home PT Goal Formulation: With patient Time For Goal Achievement: 01/21/21 Potential to Achieve Goals: Good Progress towards PT goals: Progressing toward goals    Frequency    Min 2X/week      PT  Plan Current plan remains appropriate    Co-evaluation              AM-PAC PT "6 Clicks" Mobility   Outcome Measure  Help needed turning from your back to your side while in a flat bed without using bedrails?: A Little Help needed moving from lying on your back to sitting on the side of a flat bed without using bedrails?: A Little Help needed moving to and from a bed to a chair (including a wheelchair)?: A Little Help needed standing up from a chair using your arms (e.g., wheelchair or bedside chair)?: A Little Help  needed to walk in hospital room?: A Little Help needed climbing 3-5 steps with a railing? : A Lot 6 Click Score: 17    End of Session Equipment Utilized During Treatment: Gait belt Activity Tolerance: Patient tolerated treatment well Patient left: in chair;with call bell/phone within reach;with family/visitor present;with chair alarm set (family present and to notify staff if leaving room) Nurse Communication: Mobility status PT Visit Diagnosis: Unsteadiness on feet (R26.81);Difficulty in walking, not elsewhere classified (R26.2)     Time: 8338-2505 PT Time Calculation (min) (ACUTE ONLY): 23 min  Charges:  $Gait Training: 8-22 mins $Therapeutic Exercise: 8-22 mins                     Blondell Reveal Kistler PT 01/11/2021  Acute Rehabilitation Services Pager [phone] Office [phone]

## 2021-01-11 NOTE — Progress Notes (Signed)
[facility] [address] [facility]) Hospital Liaison Note  Notified by Christianne Dolin of patient/family request for [facility] palliative services at home after discharge.   [facility] hospital liaison will follow patient for discharge disposition.   Please call with any hospice or outpatient palliative care questions or concerns.   Thank you for the opportunity to participate in this patient's care.  Jhonnie Garner, Therapist, sports, BSN, [facility] [phone]

## 2021-01-11 NOTE — TOC Progression Note (Signed)
Transition of Care [facility]) - Progression Note    Patient Details  Name: Leon Estes MRN: [id number] Date of Birth: 25-Sep-1930  Transition of Care [facility]) CM/SW Contact  Ross Ludwig, [place] Phone Number: 01/11/2021, 5:14 PM  Clinical Narrative:     Patient accepted by [facility] for home health services.  Patient also to be followed by palliative outpatient through [place].  CSW contacted [facility] they can accept patient.  CSW to continue to follow patient's progress, equipment has been delivered already.  Expected Discharge Plan: [place] Barriers to Discharge: Continued Medical Work up  Expected Discharge Plan and Services Expected Discharge Plan: [place]   Discharge Planning Services: CM Consult Post Acute Care Choice: [place] arrangements for the past 2 months: Single Family Home                 DME Arranged: 3-N-1,Shower stool DME Agency: [facility] Date DME Agency Contacted: 01/10/21 Time DME Agency Contacted: [phone] Representative spoke with at DME Agency: [place] Determinants of Health ([place]) Interventions    Readmission Risk Interventions No flowsheet data found.

## 2021-01-11 NOTE — Progress Notes (Addendum)
PHARMACY - TOTAL PARENTERAL NUTRITION CONSULT NOTE   Indication: Prolonged NPO, Cdiff  Patient Measurements: Height: 5\' 7"  (170.2 cm) Weight: 76.2 kg (167 lb 15.9 oz) IBW/kg (Calculated) : 66.1 TPN AdjBW (KG): 71.2 Body mass index is 26.31 kg/m. Usual Weight:   Assessment: 71 yoM admitted on 5/17 with fever and weakness.  PMH significant for planned outpatient lap cholecystectomy on 5/31, recent inpt admission at [facility] 4/16-4/19, CAD s/p stent, COPD, CKD, HTN, HLD, BPH.  Surgical intervention has been postponed d/t severe Cdiff and sepsis, concern for toxic megacolon.  He was briefly on CLD from 5/21-5/23, but has been otherwise NPO.    Glucose / Insulin: No hx DM or any DM medications. SoluMedrol (5/20-5/27) - CBGs: slightly higher after advancing TPN back to full rate (mostly 150-200 range) - sensitive SSI: 7 units/ 24 hrs Electrolytes: K remains low despite repeated supplementation and escalating TPN, likely d/t GI losses; Ca remains slightly low but stable; would like Mg slightly higher with ileus, but otherwise all other labs WNL Renal: SCr stable WNL; BUN & bicarb WNL but rising slowly; UOP not charted Hepatic: LFTs and Tbili WNL on 5/30; TG stable WNL Prealbumin: 26.5 (5/27), 24.4 (5/30) Intake / Output; MIVF: D5 at 18ml/hr - Net I/O: + 1659mL; UOP 0.4 ml/kg/hr - Stool: x2 yesterday - Per MD, patient improving and with better PO intake 5/31 GI Imaging: - 5/23 CT colon is diffusely distended and fluid-filled to the rectum,concerning for toxic megacolon; trace ascites, anasarca; Prostatomegaly, CAD - 5/26 AXR:  Stable, unchanged gaseous distention of the colon; No definite small bowel dilatation is noted. - 5/29 AXR: Diffuse gaseous distension of the colon similar to prior Radiograph. Crescentic lucency along the bowel wall in the right lower quadrant may be within the bowel lumen or represent pneumatosis. GI Surgeries / Procedures:   Central access: PICC 5/26 TPN start date:  5/27  Nutritional Goals (per RD recommendation on 5/25): Kcal:  1600-1800 kcal, Protein:  70-85 grams, Fluid:  >/= 1.8 L/day Goal TPN rate is 75 mL/hr (provides 81 g of protein and 1760 kcals per day)  Current Nutrition:  TPN at 1/2 rate 5/29 cleared by speech for dysphagia 2 diet  Plan:   MD feels patient improved; asking to wean TPN to 1/2 rate today  Mg 2g IV x 1  KPhos 30 mmol x 1 IV (provides 45 mEq K)  KCl 10 mEq IV x 6  At 18:00:  Continue TPN, decrease back to 40 ml/hr  Electrolytes in TPN: incr Mag; decr Ac  Na 56mEq/L   K 80 mEq/L  Ca 34mEq/L   Mg 10 mEq/L   Phos 47mmol/L.   Cl:Ac 1:1  Add standard MVI and trace elements to TPN  Change SSI to moderate scale with q8 hr CBG checks  Continue MIVF per MD  Monitor TPN labs on Mon/Thurs  Reuel Boom, PharmD, BCPS [phone] 01/11/2021, 8:18 AM

## 2021-01-11 NOTE — Progress Notes (Addendum)
PROGRESS NOTE    [name]  ALP:[id number] DOB: 03/28/1931 DOA: 12/27/2020 PCP: Venia Carbon, MD    Brief Narrative:  Leon Estes was admitted to the hospitalwith theworking diagnosis of severe sepsis due to [name], chronic cholecystitis.  87 year old male past medical history for coronary artery disease, COPD, chronic kidney disease, hypertension, dyslipidemia and BPH who presented with fevers and weakness. Recent hospitalization for cholecystitis, medically treated 4/16-4/19/2022. At home patient had recurrent fevers and diaphoresis that prompted him to come back to the hospital. On his initial physical examination his blood pressure was 142/88, heart rate 96, respiratory rate 16, temperature 103.1 F rectally oxygenation 90% on room air. His lungs are clear to auscultation bilaterally, heart S1-S2, present, rhythmic, abdomen soft nontender, no lower extremity edema.  Sodium 139, potassium 4.1, chloride 106, bicarb 24, glucose 133, BUN 22, creatinine 1.48, lactic acid 3.2,white count 15.3, hemoglobin 12.9, hematocrit 42.1, platelets 238. SARS COVID-19 negative.  Urinalysis specific gravity 1.039, 0-5 white cells>300 protein.  Stool positive for C. Difficile.  CT of the abdomen and pelvis with prostatomegaly, stable appearance of gallbladder with mild, stable central intrahepatic biliary dilatation.  Patient has developed colonic dilatation, concerned for early toxic megacolon. Placed on oral and rectal vancomycin plus IV metronidazole. Surgery has recommended medical therapy.  Patient very weak and deconditioned, poor oral intake, currently on TPN for supportive nutrition.   06/01 Colitis slowly improving, transitioned to oral vancomycin and discontinue vancomycin enemas.  Persistent hyperkalemia due to GI losses.    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 (end-organ damage AKI and metabolic encephalopathy) toxic megacolon. Clinically no improving, his abdomen is less distended and more soft. Continue to have poor oral intake but improved compared to prior days.  Wbc is down to 6,9, continue to be afebrile.   Plan to continue oral vancomycin, will discontinue vancomycin enemas.  Continue with IV metronidazole for now.  Decrease TPN by 50% today with the intention to discontinue tomorrow.  Follow cell count in am. Continue to encourage po intake.   2. AKI with Hypokalemia/ hypernatremia/ hypomagnesemia/ non anion gap metabolic acidosis. Continue to have hypokalemia with K down to 2,7 with serum cr at 0,85, NA 137 and bicarbonate at 26. Mg 1,9  Continue K correction with Kcl po 90 meq today. Add 2 g mag sulfate.   Follow up renal function and electrolytes in am.    3. Acute metabolic encephalopathy, delirium Mentation continue to improve, he continue to be very weak and deconditioned.   4. HTN/ dyslipidemiablood pressure stable off antihypertensive medications.   On simvastatin.   5, COPDContinue with budesonide.No clinical signs of exacerbation.   6. Paroxysmal atrial fibrillation.Continue rate control with metoprolol, plan to resume anticoagulation at discharge.  Colitis is improving.   7. T2DM. fasting glucose is 202, capillary 176, 178, 174. Continue current insulin regimen, patient with poor oral intake.    Patient continue  to be at high risk for worsening colitis and  hypokalemia,.   Status is: Inpatient  Remains inpatient appropriate because:IV treatments appropriate due to intensity of illness or inability to take PO   Dispo:  Patient From: Home  Planned Disposition: Home with Health Care Svc  Medically stable for discharge: No     DVT prophylaxis: Enoxaparin   Code Status:   DNR Family Communication:  No family at the bedside       Nutrition Status: Nutrition Problem: Increased nutrient needs Etiology: acute illness,post-op healing Signs/Symptoms: estimated needs Interventions: TPN   Antimicrobials:   Oral vancomycin     Subjective: Patient is feeling better, improved abdominal pain, no nausea or vomiting, no chest pain or dyspnea.   Objective: Vitals:   01/11/21 0500 01/11/21 0525 01/11/21 0953 01/11/21 1341  BP:  (!) 151/76  (!) 143/83  Pulse:  63  98  Resp:  (!) 22  20  Temp:  97.8 F (36.6 C)  97.8 F (36.6 C)  TempSrc:  Oral  Oral  SpO2:  99% 97% 96%  Weight: 76.2 kg     Height:        Intake/Output Summary (Last 24 hours) at 01/11/2021 1419 Last data filed at 01/11/2021 0900 Gross per 24 hour  Intake 1886.58 ml  Output 1 ml  Net 1885.58 ml   Filed Weights   01/09/21 0554 01/10/21 0500 01/11/21 0500  Weight: 74.7 kg 76.2 kg 76.2 kg    Examination:   General: Not in pain or dyspnea, deconditioned  Neurology: Awake and alert, non focal  E ENT: mild pallor, no icterus, oral mucosa moist Cardiovascular: No JVD. S1-S2 present, rhythmic, no gallops, rubs, or murmurs. Trace lower extremity edema. Pulmonary: vesicular breath sounds bilaterally, adequate air movement, no wheezing, rhonchi or rales. Gastrointestinal. Abdomen soft, mild distended but not tender to superficial palpation Skin. No rashes Musculoskeletal: no joint deformities     Data Reviewed: I have personally reviewed following labs and imaging studies  CBC: Recent Labs  Lab 01/05/21 0240 01/06/21 0250 01/07/21 0546 01/08/21 0310  01/09/21 0400 01/11/21 0253  WBC 12.8* 11.1* 16.7* 13.3* 12.3* 6.9  NEUTROABS 10.4* 9.0*  --   --  9.6* 4.8  HGB 10.6* 10.6* 10.5* 10.3* 10.4* 9.0*  HCT 32.5* 33.3* 31.7* 31.4* 32.3* 28.1*  MCV 87.1 87.6 85.7 86.0 86.4 88.6  PLT 250 230 232 200 185 626   Basic Metabolic Panel: Recent Labs  Lab 01/07/21 0213 01/07/21 0546 01/07/21 1430 01/08/21 0310 01/08/21 1800 01/09/21 0400 01/09/21 1818 01/10/21 0436 01/11/21 0253 01/11/21 0830  NA 139 138   < > 139 138 138  --  136 137  --   K 2.9* 2.8*   < > 2.9* 3.2* 2.8* 3.1* 2.9* 2.7*  --   CL 112* 110   < > 111 109 108  --  107 107  --   CO2 20* 22   < > 24 24 25   --  25 26  --   GLUCOSE 144* 149*   < > 119* 130* 146*  --  159* 202*  --   BUN 19 20   < > 19 17 16   --  18 22  --   CREATININE 0.69 0.68   < > 0.66 0.73 0.76  --  0.94 0.85  --   CALCIUM 8.1* 8.1*   < > 7.8* 7.7* 7.7*  --  7.5* 7.5*  --   MG 1.8 1.9  --  1.7  --  1.8  --  1.9 1.9  --   PHOS 2.4* 2.4*  --  2.4*  --  2.8  --   --   --  2.2*   < > = values in this interval not displayed.   GFR: Estimated Creatinine Clearance: 57.2 mL/min (by C-G formula based on SCr of 0.85 mg/dL). Liver Function Tests: Recent Labs  Lab 01/05/21 0240 01/06/21 0250 01/09/21 0400  AST 23 23 19   ALT 37 34 26  ALKPHOS 41 39 37*  BILITOT 0.4 0.5 0.6  PROT 5.0* 5.1* 5.0*  ALBUMIN 2.6* 2.6* 2.5*   No results for input(s): LIPASE, AMYLASE in the last 168 hours. No results for input(s): AMMONIA in the last 168 hours. Coagulation Profile: No results for input(s): INR, PROTIME in the last 168 hours. Cardiac Enzymes: No results for input(s): CKTOTAL, CKMB, CKMBINDEX, TROPONINI in the last 168 hours. BNP (last 3 results) No results for input(s): PROBNP in the last 8760 hours. HbA1C: No results for input(s): HGBA1C in the last 72 hours. CBG: Recent Labs  Lab 01/10/21 1235 01/10/21 1711 01/10/21 2359 01/11/21 0548 01/11/21 1139  GLUCAP 152* 133* 176* 178* 174*   Lipid  Profile: Recent Labs    01/09/21 0400  TRIG 107   Thyroid Function Tests: No results for input(s): TSH, T4TOTAL, FREET4, T3FREE, THYROIDAB in the last 72 hours. Anemia Panel: No results for input(s): VITAMINB12, FOLATE, FERRITIN, TIBC, IRON, RETICCTPCT in the last 72 hours.    Radiology Studies: I have reviewed all of the imaging during this hospital visit personally     Scheduled Meds: . aspirin EC  81 mg Oral Daily  . budesonide (PULMICORT) nebulizer solution  0.5 mg Nebulization BID  . Chlorhexidine Gluconate Cloth  6 each Topical Daily  . enoxaparin (LOVENOX) injection  40 mg Subcutaneous Q24H  . insulin aspart  0-15 Units Subcutaneous Q8H  . ipratropium-albuterol  3 mL Nebulization BID  . mouth rinse  15 mL Mouth Rinse BID  . metoprolol tartrate  25 mg Oral Q6H  . potassium chloride  30 mEq Oral Q4H  . simvastatin  40 mg Oral QPM  . sodium chloride flush  10-40 mL Intracatheter Q12H  . sodium chloride flush  3 mL Intravenous Q12H  . tamsulosin  0.4 mg Oral Daily  . vancomycin  500 mg Oral Q6H  . vancomycin (VANCOCIN) rectal ENEMA  500 mg Rectal Q6H   Continuous Infusions: . sodium chloride 250 mL (01/09/21 0751)  . dextrose 10 mL/hr at 01/09/21 0905  . magnesium sulfate bolus IVPB    . metronidazole 500 mg (01/11/21 0534)  . potassium PHOSPHATE IVPB (in mmol)    . TPN ADULT (ION) 75 mL/hr at 01/10/21 1745  . TPN ADULT (ION)       LOS: 15 days        Horrace Hanak Gerome Apley, MD

## 2021-01-11 NOTE — Progress Notes (Signed)
Daily Progress Note   Patient Name: Leon Estes       Date: 01/11/2021 DOB: 1930-11-05  Age: 87 y.o. MRN#: [id number] Attending Physician: Tawni Millers Primary Care Physician: Venia Carbon, MD Admit Date: 12/27/2020  Reason for Consultation/Follow-up: Establishing goals of care  Subjective: Chart reviewed including personal review of pertinent labs and imaging.  I saw and examined [name] today.  He was sitting in bedside chair in no distress.  He was awake, alert, and interactive.  Daughter and wife were at bedside as well.  Discussed his clinical course since I saw him last week as well as plan of care moving forward.  When I asked him about needs, he stated only to be able to go home.  Family is clear that they want to take him home with home health when time comes to discharge from the hospital.  We discussed working to transition off of TPN being the next step toward transition home.  Family reported understanding.   Length of Stay: 15  Current Medications: Scheduled Meds:  . aspirin EC  81 mg Oral Daily  . budesonide (PULMICORT) nebulizer solution  0.5 mg Nebulization BID  . Chlorhexidine Gluconate Cloth  6 each Topical Daily  . enoxaparin (LOVENOX) injection  40 mg Subcutaneous Q24H  . insulin aspart  0-15 Units Subcutaneous Q8H  . ipratropium-albuterol  3 mL Nebulization BID  . mouth rinse  15 mL Mouth Rinse BID  . metoprolol tartrate  25 mg Oral Q6H  . potassium chloride  30 mEq Oral TID  . simvastatin  40 mg Oral QPM  . sodium chloride flush  10-40 mL Intracatheter Q12H  . sodium chloride flush  3 mL Intravenous Q12H  . tamsulosin  0.4 mg Oral Daily  . vancomycin  500 mg Oral Q6H    Continuous Infusions: . sodium chloride 250 mL (01/09/21 0751)   . dextrose 10 mL/hr at 01/09/21 0905  . metronidazole 500 mg (01/11/21 1442)  . potassium PHOSPHATE IVPB (in mmol)    . TPN ADULT (ION) 75 mL/hr at 01/10/21 1745  . TPN ADULT (ION) 40 mL/hr at 01/11/21 1703    PRN Meds: sodium chloride, acetaminophen, albuterol, fluticasone furoate-vilanterol, metoprolol tartrate, ondansetron (ZOFRAN) IV, sodium chloride flush  General: Alert, awake, in no acute distress. Sitting in bedside  chair Heart: Regular rate and rhythm.  Lungs: Good air movement Ext: No significant edema Skin: Warm and dry Neuro: Grossly intact, nonfocal.   Vital Signs: BP (!) 143/83   Pulse 98   Temp 97.8 F (36.6 C) (Oral)   Resp 20   Ht 5\' 7"  (1.702 m)   Wt 76.2 kg   SpO2 96%   BMI 26.31 kg/m  SpO2: SpO2: 96 % O2 Device: O2 Device: Room Air O2 Flow Rate: O2 Flow Rate (L/min): 2 L/min  Intake/output summary:   Intake/Output Summary (Last 24 hours) at 01/11/2021 1709 Last data filed at 01/11/2021 0900 Gross per 24 hour  Intake 1886.58 ml  Output 1 ml  Net 1885.58 ml   LBM: Last BM Date: 01/10/21 Baseline Weight: Weight: 71.2 kg Most recent weight: Weight: 76.2 kg      Flowsheet Rows   Flowsheet Row Most Recent Value  Intake Tab   Referral Department Hospitalist  Unit at Time of Referral ICU  Palliative Care Primary Diagnosis Sepsis/Infectious Disease  Date Notified 12/30/20  Palliative Care Type New Palliative care  Reason for referral Clarify Goals of Care  Date of Admission 12/27/20  Date first seen by Palliative Care 12/31/20  # of days Palliative referral response time 1 Day(s)  # of days IP prior to Palliative referral 3  Clinical Assessment   Palliative Performance Scale Score 20%  Psychosocial & Spiritual Assessment   Palliative Care Outcomes   Patient/Family meeting held? Yes  Who was at the meeting? Wife, son, daughter  Palliative Care Outcomes Clarified goals of care      Patient Active Problem List   Diagnosis Date Noted  .  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 Palliative care by specialist   . Goals of care, counseling/discussion   . General weakness   . Colitis due to Clostridioides difficile 12/31/2020  . Acute delirium 12/31/2020  . HOH (hard of hearing) 12/28/2020  . Acute on chronic cholecystitis 12/28/2020  . Hypokalemia 12/28/2020  . Severe sepsis ([place]) 12/27/2020  . Arrhythmia 12/06/2020  . Pulmonary nodule 12/06/2020  . Acute cholecystitis 11/26/2020  . Preventative health care 10/12/2020  . Macular degeneration, wet ([place]) 10/12/2020  . Acquired claw toe, left 02/02/2020  . BPH (benign prostatic hyperplasia) 01/13/2020  . Neuropathy 02/18/2018  . COPD (chronic obstructive pulmonary disease) ([place])   . Chronic renal disease, stage III ([place])   . Benign essential HTN 04/09/2017  . CAD (coronary artery disease) 03/03/2015  . Hyperlipidemia 03/03/2015    Palliative Care Assessment & Plan   Patient Profile: 87 y.o. male  with past medical history of CHF, COPD, choledocholithiasis, CAD status post PCI, COPD, CKD, hypertension, hyperlipidemia, BPH admitted on 12/27/2020 with sepsis.  He was recently admitted to [facility] 4/16 through 4/19 for presumed cholecystitis.  He was being followed by Dr. Redmond Pulling for cholecystectomy later this month.  Initially, there was concern that his presentation was related to cholecystitis again, however, with further work-up it appears that this is actually more from C. difficile.  His hospital course has been further complicated by A. fib, COPD exacerbation, and development of agitation/delirium.  Palliative consulted for goals of care.  Recommendations/Plan:  DNR  Continue current interventions.  He is certainly much improved from when I saw him last week.  Goal is to transition home with home health when ready to discharge.    Family is agreeable to follow-up by outpatient palliative care as well.  No other specific palliative recommendations at this point.  Someone from our team  will check in with family for continued support.  Code Status:  DNR  Code Status Orders  (From admissi  on, onward)         Start     Ordered   12/29/20 1002  Do not attempt resuscitation (DNR)  Continuous       Question Answer Comment  In the event of cardiac or respiratory ARREST Do not call a "code blue"   In the event of cardiac or respiratory ARREST Do not perform Intubation, CPR, defibrillation or ACLS   In the event of cardiac or respiratory ARREST Use medication by any route, position, wound care, and other measures to relive pain and suffering. May use oxygen, suction and manual treatment of airway obstruction as needed for comfort.      12/29/20 1001        Code Status History    Date Active Date Inactive Code Status Order ID Comments User Context   12/27/2020 2202 12/29/2020 1001 Full Code [id number]  Lenore Cordia, MD ED   11/26/2020 1947 11/29/2020 1648 Full Code [id number]  Kayleen Memos, DO ED   Advance Care Planning Activity     Prognosis:  Guarded  Discharge Planning:  Home with Home Health and recommendation for outpatient palliative care  Care plan was discussed with patient, wife, and daughter.   Total time: 25 minutes Greater than 50%  of this time was spent counseling and coordinating care related to the above assessment and plan.  Micheline Rough, MD [place] Team [phone]

## 2021-01-11 NOTE — Plan of Care (Signed)
  Problem: Education: Goal: Knowledge of General Education information will improve Description Including pain rating scale, medication(s)/side effects and non-pharmacologic comfort measures Outcome: Progressing   Problem: Health Behavior/Discharge Planning: Goal: Ability to manage health-related needs will improve Outcome: Progressing   

## 2021-01-12 DIAGNOSIS — N401 Enlarged prostate with lower urinary tract symptoms: Secondary | ICD-10-CM | POA: Diagnosis not present

## 2021-01-12 DIAGNOSIS — K812 Acute cholecystitis with chronic cholecystitis: Secondary | ICD-10-CM | POA: Diagnosis not present

## 2021-01-12 DIAGNOSIS — A0472 Enterocolitis due to Clostridium difficile, not specified as recurrent: Secondary | ICD-10-CM | POA: Diagnosis not present

## 2021-01-12 DIAGNOSIS — I10 Essential (primary) hypertension: Secondary | ICD-10-CM | POA: Diagnosis not present

## 2021-01-12 LAB — CBC WITH DIFFERENTIAL/PLATELET
Abs Immature Granulocytes: 0.06 10*3/uL (ref 0.00–0.07)
Basophils Absolute: 0 10*3/uL (ref 0.0–0.1)
Basophils Relative: 0 %
Eosinophils Absolute: 0.1 10*3/uL (ref 0.0–0.5)
Eosinophils Relative: 1 %
HCT: 28 % — ABNORMAL LOW (ref 39.0–52.0)
Hemoglobin: 9.1 g/dL — ABNORMAL LOW (ref 13.0–17.0)
Immature Granulocytes: 1 %
Lymphocytes Relative: 16 %
Lymphs Abs: 1.3 10*3/uL (ref 0.7–4.0)
MCH: 28.7 pg (ref 26.0–34.0)
MCHC: 32.5 g/dL (ref 30.0–36.0)
MCV: 88.3 fL (ref 80.0–100.0)
Monocytes Absolute: 0.7 10*3/uL (ref 0.1–1.0)
Monocytes Relative: 8 %
Neutro Abs: 5.9 10*3/uL (ref 1.7–7.7)
Neutrophils Relative %: 74 %
Platelets: 207 10*3/uL (ref 150–400)
RBC: 3.17 MIL/uL — ABNORMAL LOW (ref 4.22–5.81)
RDW: 16.6 % — ABNORMAL HIGH (ref 11.5–15.5)
WBC: 8.1 10*3/uL (ref 4.0–10.5)
nRBC: 0 % (ref 0.0–0.2)

## 2021-01-12 LAB — COMPREHENSIVE METABOLIC PANEL
ALT: 23 U/L (ref 0–44)
AST: 22 U/L (ref 15–41)
Albumin: 2.4 g/dL — ABNORMAL LOW (ref 3.5–5.0)
Alkaline Phosphatase: 36 U/L — ABNORMAL LOW (ref 38–126)
Anion gap: 7 (ref 5–15)
BUN: 18 mg/dL (ref 8–23)
CO2: 22 mmol/L (ref 22–32)
Calcium: 7.7 mg/dL — ABNORMAL LOW (ref 8.9–10.3)
Chloride: 109 mmol/L (ref 98–111)
Creatinine, Ser: 0.75 mg/dL (ref 0.61–1.24)
GFR, Estimated: >60 mL/min (ref >60)
Glucose, Bld: 121 mg/dL — ABNORMAL HIGH (ref 70–99)
Potassium: 3.2 mmol/L — ABNORMAL LOW (ref 3.5–5.1)
Sodium: 138 mmol/L (ref 135–145)
Total Bilirubin: 0.2 mg/dL — ABNORMAL LOW (ref 0.3–1.2)
Total Protein: 4.6 g/dL — ABNORMAL LOW (ref 6.5–8.1)

## 2021-01-12 LAB — GLUCOSE, CAPILLARY
Glucose-Capillary: 125 mg/dL — ABNORMAL HIGH (ref 70–99)
Glucose-Capillary: 183 mg/dL — ABNORMAL HIGH (ref 70–99)

## 2021-01-12 LAB — MAGNESIUM: Magnesium: 2 mg/dL (ref 1.7–2.4)

## 2021-01-12 LAB — PHOSPHORUS: Phosphorus: 3.4 mg/dL (ref 2.5–4.6)

## 2021-01-12 MED ORDER — BOOST / RESOURCE BREEZE PO LIQD CUSTOM
1.0000 | ORAL | Status: DC
  Administered 2021-01-12 – 2021-01-22 (×10): 1 via ORAL

## 2021-01-12 MED ORDER — POTASSIUM CHLORIDE 10 MEQ/100ML IV SOLN: 10.0000 meq | INTRAVENOUS | Status: DC

## 2021-01-12 MED ORDER — POTASSIUM CHLORIDE 10 MEQ/50ML IV SOLN
10.0000 meq | INTRAVENOUS | Status: AC
  Administered 2021-01-12 (×6): 10 meq via INTRAVENOUS
  Filled 2021-01-12 (×6): qty 50

## 2021-01-12 MED ORDER — ENSURE ENLIVE PO LIQD
237.0000 mL | Freq: Two times a day (BID) | ORAL | Status: DC
  Administered 2021-01-12 – 2021-01-23 (×20): 237 mL via ORAL

## 2021-01-12 NOTE — Progress Notes (Signed)
Occupational Therapy Treatment Patient Details Name: Leon Estes MRN: [id number] DOB: 22-Oct-1930 Today's Date: 01/12/2021    History of present illness 87 year old male with history of CAD status post stenting to RCA in 1997, COPD, CKD stage III, hypertension, hyperlipidemia, BPH, recent admission at [facility] from 11/26/2020-11/29/2020 with presumed acute cholecystitis ,presented on 12/27/2020 with fever and generalized weakness.Hospital course has been complicated by delirium/agitation along with A. fib with RVR, C Diff., abdominal distention with imaging showing concern for toxic megacolon   OT comments  Patient sleepy upon arrival however able to arouse and agreeable to OT. Patient needing cues and mod A for trunk support and lowering legs off edge of bed. Set up for washing face. With bed height elevated patient min A with cues for hand placement to stand, min A to steady and take few steps to chair. Unsure of patient's baseline mentation however patient asking if we are going to mow the lawn and "where is your mama?" Notified RN patient up to chair with alarm and mits re-donned after use of walker.   Follow Up Recommendations  SNF    Equipment Recommendations  3 in 1 bedside commode       Precautions / Restrictions Precautions Precautions: Fall Precaution Comments: wife reports 2-3 recent falls       Mobility Bed Mobility Overal bed mobility: Needs Assistance Bed Mobility: Supine to Sit     Supine to sit: Mod assist;HOB elevated     General bed mobility comments: for trunk support    Transfers Overall transfer level: Needs assistance Equipment used: Rolling walker (2 wheeled) Transfers: Sit to/from [facility] Sit to Stand: Min assist;From elevated surface Stand pivot transfers: Min assist       General transfer comment: verbal cues for body mechanics, min A for steadying to take few steps to recliner    Balance Overall balance assessment: Needs  assistance Sitting-balance support: Feet supported Sitting balance-Leahy Scale: Fair     Standing balance support: Bilateral upper extremity supported;During functional activity Standing balance-Leahy Scale: Poor Standing balance comment: reliant on UE support                           ADL either performed or assessed with clinical judgement   ADL Overall ADL's : Needs assistance/impaired     Grooming: Wash/dry face;Set up;Sitting                   Toilet Transfer: Minimal assistance;Cueing for safety;Cueing for sequencing;Stand-pivot;RW Toilet Transfer Details (indicate cue type and reason): to recliner, bed height elevated. needed cues for body mechanics. patient unsteady however min A to take few steps with walker to recliner chair. poor carry over of reaching back for chair arms when sitting         Functional mobility during ADLs: Minimal assistance;Rolling walker;Cueing for safety;Cueing for sequencing                 Cognition Arousal/Alertness: Awake/alert Behavior During Therapy: WFL for tasks assessed/performed Overall Cognitive Status: Impaired/Different from baseline Area of Impairment: Memory;Orientation;Following commands                 Orientation Level: Disoriented to;Situation;Place (states incorrect town name)   Memory: Decreased short-term memory Following Commands: Follows one step commands with increased time       General Comments: patient asking if we are going to mow the lawn, then asked "where's your mama"  Pertinent Vitals/ Pain       Pain Assessment: Faces Faces Pain Scale: Hurts little more Pain Location: L buttock when moving gown Pain Descriptors / Indicators: Tender Pain Intervention(s): Monitored during session         Frequency  Min 2X/week        Progress Toward Goals  OT Goals(current goals can now be found in the care plan section)  Progress towards OT goals:  Progressing toward goals  Acute Rehab OT Goals Patient Stated Goal: ready to get out of bed, go home OT Goal Formulation: With patient Time For Goal Achievement: 01/21/21 Potential to Achieve Goals: Good ADL Goals Pt Will Perform Grooming: with min assist;standing Pt Will Perform Upper Body Dressing: with supervision;sitting Pt Will Perform Lower Body Dressing: with mod assist;sit to/from stand Pt Will Transfer to Toilet: with min assist;ambulating;bedside commode Pt Will Perform Toileting - Clothing Manipulation and hygiene: with min assist;sit to/from stand Additional ADL Goal #1: Pt will perform bed mobility with supervision in preparation for ADL.  Plan Discharge plan remains appropriate       AM-PAC OT "6 Clicks" Daily Activity     Outcome Measure   Help from another person eating meals?: A Little Help from another person taking care of personal grooming?: A Little Help from another person toileting, which includes using toliet, bedpan, or urinal?: A Lot Help from another person bathing (including washing, rinsing, drying)?: A Lot Help from another person to put on and taking off regular upper body clothing?: A Little Help from another person to put on and taking off regular lower body clothing?: A Lot 6 Click Score: 15    End of Session Equipment Utilized During Treatment: Gait belt;Rolling walker  OT Visit Diagnosis: Unsteadiness on feet (R26.81);Other abnormalities of gait and mobility (R26.89);Muscle weakness (generalized) (M62.81);Other symptoms and signs involving cognitive function   Activity Tolerance Patient tolerated treatment well   Patient Left in chair;with call bell/phone within reach;with chair alarm set;Other (comment) (mits donned)   Nurse Communication Mobility status        Time: 2426-8341 OT Time Calculation (min): 23 min  Charges: OT General Charges $OT Visit: 1 Visit OT Treatments $Self Care/Home Management : 23-37 mins  Delbert Phenix OT OT  pager: [phone]   Rosemary Holms 01/12/2021, 12:51 PM

## 2021-01-12 NOTE — Progress Notes (Signed)
Nutrition Follow-up  DOCUMENTATION CODES:   Not applicable  INTERVENTION:  - will order Boost Breeze once/day, each supplement provides 250 kcal and 9 grams of protein. - will order Ensure Enlive BID, each supplement provides 350 kcal and 20 grams of protein. - continue TPN weaning per Pharmacist.   NUTRITION DIAGNOSIS:   Increased nutrient needs related to acute illness,post-op healing as evidenced by estimated needs. -ongoing  GOAL:   Patient will meet greater than or equal to 90% of their needs -unmet  MONITOR:   PO intake,Supplement acceptance,Labs,Weight trends,Other (Comment) (TPN regimen)  ASSESSMENT:   87 year old male with medical history of CAD s/p stenting in 1997, COPD, stage 3 CKD, HTN, HLD, and BPH. He was admitted to [facility] 4/16-4/19 for cholecystitis, cystic duct obstruction with plan for lap chole on 5/31. He presented to the ED on 5/17 with fever and generalized weakness. CT abdomen/pelvis showed prostatomegaly.  Diet advanced to Dysphagia 2, thin liquids on 5/29 at 0926. Documented meal intakes since that time were 50% of lunch on 5/30; 15% of breakfast and 5% of lunch on 5/31; 25% of breakfast on 6/1; 20% of breakfast this AM.  He has a double lumen PICC in R brachial (placed 5/27) and is receiving custom TPN at 40 ml/hr. Plan is to allow bag to run out at 1800 today and then to d/c TPN.   Weight now consistent with weight at the beginning of admission; has fluctuated throughout the past 2 weeks. No information documented in the edema section of flow sheet.   Palliative Care following and last saw patient yesterday. He is DNR and plan is for transition home with Home Health after this hospitalization.     Labs reviewed; CBG: 125 mg/dl, K: 3.2 mmol/l, Ca: 7.7 mg/dl. Medications reviewed; 2 g IV Mg sulfate x1 run 6/1, 30 mEq Klor-Con x2 doses 6/1, 10 mEq IV KCl x6 runs 6/2, 30 mmol IV KPhos x1 run 6/1.    Diet Order:   Diet Order            DIET DYS 2 Room  service appropriate? Yes; Fluid consistency: Thin  Diet effective now                 EDUCATION NEEDS:   Not appropriate for education at this time  Skin:  Skin Assessment: Reviewed RN Assessment  Last BM:  6/2 (type 7 x1)  Height:   Ht Readings from Last 1 Encounters:  12/27/20 5\' 7"  (1.702 m)    Weight:   Wt Readings from Last 1 Encounters:  01/11/21 76.2 kg     Estimated Nutritional Needs:  Kcal:  1600-1800 kcal Protein:  70-85 grams Fluid:  >/= 1.8 L/day      Jarome Matin, MS, RD, LDN, CNSC Inpatient Clinical Dietitian RD pager # available in [facility]  After hours/weekend pager # available in [facility]

## 2021-01-12 NOTE — Plan of Care (Signed)

## 2021-01-12 NOTE — Progress Notes (Signed)
PHARMACY - TOTAL PARENTERAL NUTRITION CONSULT NOTE   Indication: Prolonged NPO, Cdiff  Patient Measurements: Height: 5\' 7"  (170.2 cm) Weight: 76.2 kg (167 lb 15.9 oz) IBW/kg (Calculated) : 66.1 TPN AdjBW (KG): 71.2 Body mass index is 26.31 kg/m. Usual Weight:   Assessment: 12 yoM admitted on 5/17 with fever and weakness.  PMH significant for planned outpatient lap cholecystectomy on 5/31, recent inpt admission at [facility] 4/16-4/19, CAD s/p stent, COPD, CKD, HTN, HLD, BPH.  Surgical intervention has been postponed d/t severe Cdiff and sepsis, concern for toxic megacolon.  He was briefly on CLD from 5/21-5/23, but has been otherwise NPO.    Today, 01/12/2021: Per MD assessment, OK to wean TPN off today  TPN currently running at 1/2 rate  Potassium remains difficult to replace, otherwise Cmet/CBGs stable WNL  6 runs KCl ordered per MD  Will allow TPN to stop at 1800 tonight when bag runs out  Reuel Boom, PharmD, BCPS [phone] 01/12/2021, 10:33 AM

## 2021-01-12 NOTE — Progress Notes (Signed)
  Speech Language Pathology Treatment: Dysphagia  Patient Details Name: Leon Estes MRN: [id number] DOB: 12/19/1930 Today's Date: 01/12/2021 Time: 3220-2542 SLP Time Calculation (min) (ACUTE ONLY): 37 min  Assessment / Plan / Recommendation Clinical Impression  Cough x2/4 after swish and expectoration with water to practice ability to clear oral retention.  Two trials with smaller bolus did not result in post-swish coughing.  SLP also set up oral suction to help pt to clear oral retention if needed.  Pt returned demonstration of suction use.   Pt with good ability today to masticate cracker, consume ice cream, soda and water without oral retention and no indication of aspiration.  Pt was fully alert, upright and self fed.  He does present with delayed cough - nonproductive however WBC WNL and pt isn ot febrile.  Reviewed with pt compensation strategies to assure oral clearance of boluses to prevent aspiration after eating of pocketed items. Using teach back, written instructions pt benefited from moderate cues for compensations. Signs posted in room for nursing instruction as RN was at lunch.  Will follow up to assure tolerance, pt/family education to help maximize po safetly.    HPI HPI: 87 y.o. male with medical history significant for CAD s/p PCI/stenting to RCA 1997, COPD, CKD stage III, HTN, HLD, BPH who is admitted with severe sepsis.  Pt has undergone BSE and has been placed on dys2/thin diet. Follow up for po tolerance indicated.  RN reports pt with difficulties swallowing pills with applesauce today and was orally pocketing foods.  Reorder for eval noted.  Most recent CXR 5/27 without indication for pulmonary deficit. Intake has been poor - with pt only consuming 20-50% when po documented.      SLP Plan  Continue with current plan of care       Recommendations  Diet recommendations: Dysphagia 3 (mechanical soft);Thin liquid Liquids provided via: Cup;Straw Medication Administration:  Whole meds with puree (with ice cream - start and follow with liquids) Supervision: Intermittent supervision to cue for compensatory strategies Compensations: Minimize environmental distractions;Slow rate;Small sips/bites Postural Changes and/or Swallow Maneuvers: Seated upright 90 degrees;Upright 30-60 min after meal (oral suction after po as needed)                Oral Care Recommendations: Oral care BID Follow up Recommendations: Other (comment) (TBD) SLP Visit Diagnosis: Dysphagia, oropharyngeal phase (R13.12) Plan: Continue with current plan of care       GO                Macario Golds 01/12/2021, 3:11 PM   Kathleen Lime, MS [facility] SLP Acute Rehab Services Office [phone] Pager [phone]

## 2021-01-12 NOTE — Plan of Care (Signed)

## 2021-01-12 NOTE — Progress Notes (Signed)
PROGRESS NOTE    Leon Estes  GEZ:[id number] DOB: 09-04-1930 DOA: 12/27/2020 PCP: Venia Carbon, MD    Brief Narrative:  Leon Estes was admitted to the hospitalwith theworking diagnosis of severe sepsis due to [name], chronic cholecystitis.  87 year old male past medical history for coronary artery disease, COPD, chronic kidney disease, hypertension, dyslipidemia and BPH who presented with fevers and weakness. Recent hospitalization for cholecystitis, medically treated 4/16-4/19/2022. At home patient had recurrent fevers and diaphoresis that prompted him to come back to the hospital. On his initial physical examination his blood pressure was 142/88, heart rate 96, respiratory rate 16, temperature 103.1 F rectally oxygenation 90% on room air. His lungs are clear to auscultation bilaterally, heart S1-S2, present, rhythmic, abdomen soft nontender, no lower extremity edema.  Sodium 139, potassium 4.1, chloride 106, bicarb 24, glucose 133, BUN 22, creatinine 1.48, lactic acid 3.2,white count 15.3, hemoglobin 12.9, hematocrit 42.1, platelets 238. SARS COVID-19 negative.  Urinalysis specific gravity 1.039, 0-5 white cells>300 protein.  Stool positive for C. Difficile.  CT of the abdomen and pelvis with prostatomegaly, stable appearance of gallbladder with mild, stable central intrahepatic biliary dilatation.  Patient has developed colonic dilatation, concerned for early toxic megacolon. Placed on oral and rectal vancomycin plus IV metronidazole. Surgery has recommended medical therapy.  Patient very weak and deconditioned, poor oral intake, currently on TPN for supportive nutrition.  06/01 Colitis slowly improving, transitioned to oral vancomycin and discontinue vancomycin enemas.  Persistent hyperkalemia due to GI losses   Assessment & Plan:   Principal Problem:   Colitis due to Clostridioides difficile Active Problems:   CAD (coronary artery  disease)   Benign essential HTN   COPD (chronic obstructive pulmonary disease) (HCC)   Chronic renal disease, stage III (HCC)   BPH (benign prostatic hyperplasia)   Severe sepsis (HCC)   HOH (hard of hearing)   Acute on chronic cholecystitis   Hypokalemia   Acute delirium   Palliative care by specialist   Goals of care, counseling/discussion   General weakness   Toxic megacolon ([place])    1.Severe sepsis due to [name] (end-organ damage AKI and metabolic encephalopathy) toxic megacolon. Continue to improve. Slowly improving po intake.  He has remained afebrile and no leukocytosis wbc is 8,1  Tolerating well oral vancomycin. Plan to discontinue IV metronidazole at discharge from the hospital.  Discontinue TPN today in preparation for discharge home tomorrow.    2. AKI with Hypokalemia/ hypernatremia/ hypomagnesemia/ non anion gap metabolic acidosis. Serum Na 138, K 3,2 and bicarbonate at 22, serum cr is 0,75 and BUN 18, Mg is 2,0  Continue K correction, will add 60 meq IV and follow up renal function and electrolytes in am.   3. Acute metabolic encephalopathy, delirium Continue to be very weak and deconditioned, but encephalopathy now has resolved, his mentation is at baseline   Continue with dysphagia 2 diet and aspiration precautions.   4. HTN/ dyslipidemiaStable blood pressure, continue to hold on antihypertensive agents.   Continue with simvastatin.   5, COPDOn budesonide.No clinical signs of exacerbation.   6. Paroxysmal atrial fibrillation.On metoprolol for rate control. He continue to be at high risk for bleeding in the setting of recovering c diff. Will continue antiplatelet therapy with aspirin for now and plan to follow up as outpatient.   7. T2DM.continue glucose control with insulin sliding scale, his fasting glucose is 121 this am.    Status is: Inpatient  Remains inpatient appropriate because:Inpatient level of  care  appropriate due to severity of illness   Dispo:  Patient From: Home  Planned Disposition: Home with Health Care Svc  Medically stable for discharge: No     DVT prophylaxis: Enoxaparin   Code Status:    DNR   Family Communication:  I spoke with patient's wife at the bedside, we talked in detail about patient's condition, plan of care and prognosis and all questions were addressed.    Antimicrobials:   Oral vancomycin   IV metronidazole     Subjective: Patient is feeling better, his po intake is slowly improving, no nausea or vomiting, no chest pain or dyspnea.   Objective: Vitals:   01/11/21 2016 01/12/21 0002 01/12/21 0532 01/12/21 0848  BP: 109/61 (!) 110/50 125/78   Pulse: 62 61 70   Resp: 18     Temp: 98.1 F (36.7 C)  98 F (36.7 C)   TempSrc: Oral  Oral   SpO2: 99%   95%  Weight:      Height:        Intake/Output Summary (Last 24 hours) at 01/12/2021 1115 Last data filed at 01/12/2021 1032 Gross per 24 hour  Intake 280 ml  Output 800 ml  Net -520 ml   Filed Weights   01/09/21 0554 01/10/21 0500 01/11/21 0500  Weight: 74.7 kg 76.2 kg 76.2 kg    Examination:   General: Not in pain or dyspnea, deconditioned  Neurology: Awake and alert, non focal  E ENT: mild pallor, no icterus, oral mucosa moist Cardiovascular: No JVD. S1-S2 present, rhythmic, no gallops, rubs, or murmurs. No lower extremity edema. Pulmonary: positive breath sounds bilaterally, adequate air movement, no wheezing, rhonchi or rales. Gastrointestinal. Abdomen soft, mild distended but not tender Skin. No rashes Musculoskeletal: no joint deformities     Data Reviewed: I have personally reviewed following labs and imaging studies  CBC: Recent Labs  Lab 01/06/21 0250 01/07/21 0546 01/08/21 0310 01/09/21 0400 01/11/21 0253 01/12/21 0416  WBC 11.1* 16.7* 13.3* 12.3* 6.9 8.1  NEUTROABS 9.0*  --   --  9.6* 4.8 5.9  HGB 10.6* 10.5* 10.3* 10.4* 9.0* 9.1*  HCT 33.3* 31.7* 31.4* 32.3*  28.1* 28.0*  MCV 87.6 85.7 86.0 86.4 88.6 88.3  PLT 230 232 200 185 175 283   Basic Metabolic Panel: Recent Labs  Lab 01/07/21 0546 01/07/21 1430 01/08/21 0310 01/08/21 1800 01/09/21 0400 01/09/21 1818 01/10/21 0436 01/11/21 0253 01/11/21 0830 01/11/21 1900 01/12/21 0416  NA 138   < > 139   < > 138  --  136 137  --  137 138  K 2.8*   < > 2.9*   < > 2.8* 3.1* 2.9* 2.7*  --  2.8* 3.2*  CL 110   < > 111   < > 108  --  107 107  --  107 109  CO2 22   < > 24   < > 25  --  25 26  --  26 22  GLUCOSE 149*   < > 119*   < > 146*  --  159* 202*  --  143* 121*  BUN 20   < > 19   < > 16  --  18 22  --  21 18  CREATININE 0.68   < > 0.66   < > 0.76  --  0.94 0.85  --  0.82 0.75  CALCIUM 8.1*   < > 7.8*   < > 7.7*  --  7.5* 7.5*  --  7.6* 7.7*  MG 1.9  --  1.7  --  1.8  --  1.9 1.9  --   --  2.0  PHOS 2.4*  --  2.4*  --  2.8  --   --   --  2.2*  --  3.4   < > = values in this interval not displayed.   GFR: Estimated Creatinine Clearance: 60.8 mL/min (by C-G formula based on SCr of 0.75 mg/dL). Liver Function Tests: Recent Labs  Lab 01/06/21 0250 01/09/21 0400 01/12/21 0416  AST 23 19 22   ALT 34 26 23  ALKPHOS 39 37* 36*  BILITOT 0.5 0.6 0.2*  PROT 5.1* 5.0* 4.6*  ALBUMIN 2.6* 2.5* 2.4*   No results for input(s): LIPASE, AMYLASE in the last 168 hours. No results for input(s): AMMONIA in the last 168 hours. Coagulation Profile: No results for input(s): INR, PROTIME in the last 168 hours. Cardiac Enzymes: No results for input(s): CKTOTAL, CKMB, CKMBINDEX, TROPONINI in the last 168 hours. BNP (last 3 results) No results for input(s): PROBNP in the last 8760 hours. HbA1C: No results for input(s): HGBA1C in the last 72 hours. CBG: Recent Labs  Lab 01/11/21 0548 01/11/21 1139 01/11/21 1811 01/11/21 2150 01/12/21 0507  GLUCAP 178* 174* 135* 139* 125*   Lipid Profile: No results for input(s): CHOL, HDL, LDLCALC, TRIG, CHOLHDL, LDLDIRECT in the last 72 hours. Thyroid Function  Tests: No results for input(s): TSH, T4TOTAL, FREET4, T3FREE, THYROIDAB in the last 72 hours. Anemia Panel: No results for input(s): VITAMINB12, FOLATE, FERRITIN, TIBC, IRON, RETICCTPCT in the last 72 hours.    Radiology Studies: I have reviewed all of the imaging during this hospital visit personally     Scheduled Meds: . aspirin EC  81 mg Oral Daily  . budesonide (PULMICORT) nebulizer solution  0.5 mg Nebulization BID  . Chlorhexidine Gluconate Cloth  6 each Topical Daily  . enoxaparin (LOVENOX) injection  40 mg Subcutaneous Q24H  . ipratropium-albuterol  3 mL Nebulization BID  . mouth rinse  15 mL Mouth Rinse BID  . metoprolol tartrate  25 mg Oral Q6H  . simvastatin  40 mg Oral QPM  . sodium chloride flush  10-40 mL Intracatheter Q12H  . sodium chloride flush  3 mL Intravenous Q12H  . tamsulosin  0.4 mg Oral Daily  . vancomycin  500 mg Oral Q6H   Continuous Infusions: . sodium chloride 250 mL (01/09/21 0751)  . dextrose 10 mL/hr at 01/09/21 0905  . metronidazole 500 mg (01/12/21 0519)  . potassium chloride    . TPN ADULT (ION) 40 mL/hr at 01/11/21 1703     LOS: 16 days        Leon Klaiber Gerome Apley, MD

## 2021-01-13 LAB — BASIC METABOLIC PANEL
Anion gap: 6 (ref 5–15)
BUN: 15 mg/dL (ref 8–23)
CO2: 24 mmol/L (ref 22–32)
Calcium: 7.8 mg/dL — ABNORMAL LOW (ref 8.9–10.3)
Chloride: 110 mmol/L (ref 98–111)
Creatinine, Ser: 0.86 mg/dL (ref 0.61–1.24)
GFR, Estimated: >60 mL/min (ref >60)
Glucose, Bld: 101 mg/dL — ABNORMAL HIGH (ref 70–99)
Potassium: 2.9 mmol/L — ABNORMAL LOW (ref 3.5–5.1)
Sodium: 140 mmol/L (ref 135–145)

## 2021-01-13 LAB — POTASSIUM: Potassium: 3.4 mmol/L — ABNORMAL LOW (ref 3.5–5.1)

## 2021-01-13 LAB — GLUCOSE, CAPILLARY: Glucose-Capillary: 104 mg/dL — ABNORMAL HIGH (ref 70–99)

## 2021-01-13 MED ORDER — POTASSIUM CHLORIDE CRYS ER 20 MEQ PO TBCR
40.0000 meq | EXTENDED_RELEASE_TABLET | ORAL | Status: AC
  Administered 2021-01-13 (×2): 40 meq via ORAL
  Filled 2021-01-13 (×2): qty 2

## 2021-01-13 NOTE — Care Management Important Message (Signed)
Important Message  Patient Details IM Letter given to the Patient. Name: Leon Estes MRN: [id number] Date of Birth: Oct 28, 1930   Medicare Important Message Given:  Yes     Kerin Salen 01/13/2021, 11:03 AM

## 2021-01-13 NOTE — Progress Notes (Signed)
PROGRESS NOTE    Leon Estes  HYW:[id number] DOB: Nov 18, 1930 DOA: 12/27/2020 PCP: Venia Carbon, MD    Brief Narrative:  Leon Estes was admitted to the hospitalwith theworking diagnosis of severe sepsis due to [name], chronic cholecystitis.  87 year old male past medical history for coronary artery disease, COPD, chronic kidney disease, hypertension, dyslipidemia and BPH who presented with fevers and weakness. Recent hospitalization for cholecystitis, medically treated 4/16-4/19/2022. At home patient had recurrent fevers and diaphoresis that prompted him to come back to the hospital. On his initial physical examination his blood pressure was 142/88, heart rate 96, respiratory rate 16, temperature 103.1 F rectally oxygenation 90% on room air. His lungs are clear to auscultation bilaterally, heart S1-S2, present, rhythmic, abdomen soft nontender, no lower extremity edema.  Sodium 139, potassium 4.1, chloride 106, bicarb 24, glucose 133, BUN 22, creatinine 1.48, lactic acid 3.2,white count 15.3, hemoglobin 12.9, hematocrit 42.1, platelets 238. SARS COVID-19 negative.  Urinalysis specific gravity 1.039, 0-5 white cells>300 protein.  Stool positive for C. Difficile.  CT of the abdomen and pelvis with prostatomegaly, stable appearance of gallbladder with mild, stable central intrahepatic biliary dilatation.  Patient has developed colonic dilatation, concerned for early toxic megacolon.Placed onoral and rectal vancomycin plus IV metronidazole. Surgery has recommended medical therapy.  Patient very weak and deconditioned, poor oral intake, currently on TPN for supportive nutrition.  06/01Colitis slowly improving, transitioned to oral vancomycin and discontinue vancomycin enemas.  Persistent hyperkalemia due to GI losses   Assessment & Plan:   Principal Problem:   Colitis due to Clostridioides difficile Active Problems:   CAD (coronary artery  disease)   Benign essential HTN   COPD (chronic obstructive pulmonary disease) (HCC)   Chronic renal disease, stage III (HCC)   BPH (benign prostatic hyperplasia)   Severe sepsis (HCC)   HOH (hard of hearing)   Acute on chronic cholecystitis   Hypokalemia   Acute delirium   Palliative care by specialist   Goals of care, counseling/discussion   General weakness   Toxic megacolon ([place])     1.Severe sepsis due to [name](end-organ damage AKI and metabolic encephalopathy)toxic megacolon. Patient had 4 bowel movement yesterday and 3 overnight. His abdomen is distended but soft and non tender. 25% po intake.   Plan to continue oral vancomycin and IV metronidazole.  Possible dc in am home with home health and palliative care, once K was been corrected. Patient is very weak and debilitated.  Continue to monitor stool output.   2. AKI with Hypokalemia/ hypernatremia/ hypomagnesemia/ non anion gap metabolic acidosis. Persistent hypokalemia with K at 2,9 with Na 140 and serum bicarbonate at 24 with cr at 0,86.  Continue K correction with KCL 80 meq in 2 dived doses and follow up K this pm at 1800.  Follow up renal panel in am.   3. Acute metabolic encephalopathy, delirium His encephalopathy has improved, mentation is at baseline, he is very weak and deconditioned. His family would like to take him home with home health and palliative care.   On dysphagia 3 diet and aspiration precautions. Poor oral intake.   4. HTN/ dyslipidemiaBlood pressure has been stable, today 485 mmHg systolic.  On simvastatin.   5, COPDContinue with budesonide.No signs of clinical  exacerbation.  6. Paroxysmal atrial fibrillation.Continue with metoprolol heart rate has been well controlled in the 80's. No anticoagulation due to risk of bleeding and fall risk. Continue with aspirin as tolerated (he has been chewing meds).  Continue  telemetry for now, while correcting K.   7.  T2DM/ dyslipidemia. capillary glucose has been controlled 139, 125, 183, patient with poor oral intake, continue close glucose cover and monitoring with insulin sliding scale.   Continue with simvastatin.   Status is: Inpatient  Remains inpatient appropriate because:Inpatient level of care appropriate due to severity of illness   Dispo:  Patient From: Home  Planned Disposition: Home with Health Care Svc  Medically stable for discharge: No     DVT prophylaxis: Enoxaparin   Code Status:   DNR   Family Communication:  I spoke over the phone with the patient's wife about patient's  condition, plan of care, prognosis and all questions were addressed.    Nutrition Status: Nutrition Problem: Increased nutrient needs Etiology: acute illness,post-op healing Signs/Symptoms: estimated needs Interventions: Boost Breeze,Ensure Enlive (each supplement provides 350kcal and 20 grams of protein),TPN    Antimicrobials:   Vancomycin and metronidazole     Subjective: Patient is very weak and deconditioned, no abdominal pain, no nausea or vomiting, per nursing low po intake.   Objective: Vitals:   01/12/21 2142 01/13/21 0428 01/13/21 0458 01/13/21 0626  BP: (!) 126/50 (!) 146/75    Pulse: 78 82    Resp: (!) 21 15    Temp: 98.7 F (37.1 C) (!) 97.5 F (36.4 C)    TempSrc: Oral Oral    SpO2: 100% 98%  98%  Weight:   74.6 kg   Height:        Intake/Output Summary (Last 24 hours) at 01/13/2021 1006 Last data filed at 01/13/2021 0459 Gross per 24 hour  Intake 636.97 ml  Output 1075 ml  Net -438.03 ml   Filed Weights   01/10/21 0500 01/11/21 0500 01/13/21 0458  Weight: 76.2 kg 76.2 kg 74.6 kg    Examination:   General: Not in pain or dyspnea, deconditioned  Neurology: Awake and alert, non focal  E ENT: pmild pallor, no icterus, oral mucosa moist Cardiovascular: No JVD. S1-S2 present, rhythmic, no gallops, rubs, or murmurs. Trace non pitting  lower extremity edema. Pulmonary:  vesicular breath sounds bilaterally, adequate air movement, no wheezing, rhonchi or rales. Gastrointestinal. Abdomen distended but not tender,. No rebound or guarding.  Skin. No rashes Musculoskeletal: no joint deformities     Data Reviewed: I have personally reviewed following labs and imaging studies  CBC: Recent Labs  Lab 01/07/21 0546 01/08/21 0310 01/09/21 0400 01/11/21 0253 01/12/21 0416  WBC 16.7* 13.3* 12.3* 6.9 8.1  NEUTROABS  --   --  9.6* 4.8 5.9  HGB 10.5* 10.3* 10.4* 9.0* 9.1*  HCT 31.7* 31.4* 32.3* 28.1* 28.0*  MCV 85.7 86.0 86.4 88.6 88.3  PLT 232 200 185 175 267   Basic Metabolic Panel: Recent Labs  Lab 01/07/21 0546 01/07/21 1430 01/08/21 0310 01/08/21 1800 01/09/21 0400 01/09/21 1818 01/10/21 0436 01/11/21 0253 01/11/21 0830 01/11/21 1900 01/12/21 0416 01/13/21 0344  NA 138   < > 139   < > 138  --  136 137  --  137 138 140  K 2.8*   < > 2.9*   < > 2.8*   < > 2.9* 2.7*  --  2.8* 3.2* 2.9*  CL 110   < > 111   < > 108  --  107 107  --  107 109 110  CO2 22   < > 24   < > 25  --  25 26  --  26 22 24   GLUCOSE 149*   < >  119*   < > 146*  --  159* 202*  --  143* 121* 101*  BUN 20   < > 19   < > 16  --  18 22  --  21 18 15   CREATININE 0.68   < > 0.66   < > 0.76  --  0.94 0.85  --  0.82 0.75 0.86  CALCIUM 8.1*   < > 7.8*   < > 7.7*  --  7.5* 7.5*  --  7.6* 7.7* 7.8*  MG 1.9  --  1.7  --  1.8  --  1.9 1.9  --   --  2.0  --   PHOS 2.4*  --  2.4*  --  2.8  --   --   --  2.2*  --  3.4  --    < > = values in this interval not displayed.   GFR: Estimated Creatinine Clearance: 56.6 mL/min (by C-G formula based on SCr of 0.86 mg/dL). Liver Function Tests: Recent Labs  Lab 01/09/21 0400 01/12/21 0416  AST 19 22  ALT 26 23  ALKPHOS 37* 36*  BILITOT 0.6 0.2*  PROT 5.0* 4.6*  ALBUMIN 2.5* 2.4*   No results for input(s): LIPASE, AMYLASE in the last 168 hours. No results for input(s): AMMONIA in the last 168 hours. Coagulation Profile: No results for  input(s): INR, PROTIME in the last 168 hours. Cardiac Enzymes: No results for input(s): CKTOTAL, CKMB, CKMBINDEX, TROPONINI in the last 168 hours. BNP (last 3 results) No results for input(s): PROBNP in the last 8760 hours. HbA1C: No results for input(s): HGBA1C in the last 72 hours. CBG: Recent Labs  Lab 01/11/21 1139 01/11/21 1811 01/11/21 2150 01/12/21 0507 01/12/21 1457  GLUCAP 174* 135* 139* 125* 183*   Lipid Profile: No results for input(s): CHOL, HDL, LDLCALC, TRIG, CHOLHDL, LDLDIRECT in the last 72 hours. Thyroid Function Tests: No results for input(s): TSH, T4TOTAL, FREET4, T3FREE, THYROIDAB in the last 72 hours. Anemia Panel: No results for input(s): VITAMINB12, FOLATE, FERRITIN, TIBC, IRON, RETICCTPCT in the last 72 hours.    Radiology Studies: I have reviewed all of the imaging during this hospital visit personally     Scheduled Meds: . aspirin EC  81 mg Oral Daily  . budesonide (PULMICORT) nebulizer solution  0.5 mg Nebulization BID  . Chlorhexidine Gluconate Cloth  6 each Topical Daily  . enoxaparin (LOVENOX) injection  40 mg Subcutaneous Q24H  . feeding supplement  1 Container Oral Q24H  . feeding supplement  237 mL Oral BID BM  . ipratropium-albuterol  3 mL Nebulization BID  . mouth rinse  15 mL Mouth Rinse BID  . metoprolol tartrate  25 mg Oral Q6H  . potassium chloride  40 mEq Oral Q4H  . simvastatin  40 mg Oral QPM  . sodium chloride flush  10-40 mL Intracatheter Q12H  . sodium chloride flush  3 mL Intravenous Q12H  . tamsulosin  0.4 mg Oral Daily  . vancomycin  500 mg Oral Q6H   Continuous Infusions: . sodium chloride 250 mL (01/09/21 0751)  . metronidazole 500 mg (01/13/21 0526)     LOS: 17 days        Catlin Aycock Gerome Apley, MD

## 2021-01-13 NOTE — Plan of Care (Signed)
  Problem: Health Behavior/Discharge Planning: Goal: Ability to manage health-related needs will improve Outcome: Progressing   Problem: Clinical Measurements: Goal: Will remain free from infection Outcome: Progressing   Problem: Elimination: Goal: Will not experience complications related to urinary retention Outcome: Progressing   Problem: Safety: Goal: Ability to remain free from injury will improve Outcome: Progressing

## 2021-01-13 NOTE — Plan of Care (Signed)

## 2021-01-14 LAB — POTASSIUM: Potassium: 3 mmol/L — ABNORMAL LOW (ref 3.5–5.1)

## 2021-01-14 LAB — BASIC METABOLIC PANEL
Anion gap: 5 (ref 5–15)
BUN: 14 mg/dL (ref 8–23)
CO2: 24 mmol/L (ref 22–32)
Calcium: 7.7 mg/dL — ABNORMAL LOW (ref 8.9–10.3)
Chloride: 111 mmol/L (ref 98–111)
Creatinine, Ser: 0.74 mg/dL (ref 0.61–1.24)
GFR, Estimated: >60 mL/min (ref >60)
Glucose, Bld: 94 mg/dL (ref 70–99)
Potassium: 2.9 mmol/L — ABNORMAL LOW (ref 3.5–5.1)
Sodium: 140 mmol/L (ref 135–145)

## 2021-01-14 MED ORDER — METOPROLOL TARTRATE 50 MG PO TABS
50.0000 mg | ORAL_TABLET | Freq: Two times a day (BID) | ORAL | Status: DC
  Administered 2021-01-14 – 2021-01-23 (×18): 50 mg via ORAL
  Filled 2021-01-14 (×18): qty 1

## 2021-01-14 MED ORDER — POTASSIUM CHLORIDE CRYS ER 20 MEQ PO TBCR
40.0000 meq | EXTENDED_RELEASE_TABLET | ORAL | Status: AC
  Administered 2021-01-14 (×2): 40 meq via ORAL
  Filled 2021-01-14 (×2): qty 2

## 2021-01-14 NOTE — Progress Notes (Signed)
PT Cancellation Note  Patient Details Name: Leon Estes MRN: [id number] DOB: [birth year]   Cancelled Treatment:    Reason Eval/Treat Not Completed: Other (comment) Attempted 2x, but issues with his flexi-seal.  Will chack back as schedule permits.   Galen Manila 01/14/2021, 1:10 PM

## 2021-01-14 NOTE — Plan of Care (Signed)
  Problem: Clinical Measurements: Goal: Will remain free from infection Outcome: Progressing   Problem: Nutrition: Goal: Adequate nutrition will be maintained Outcome: Progressing   

## 2021-01-14 NOTE — Progress Notes (Signed)
PROGRESS NOTE    Leon Estes  DTO:[id number] DOB: 14-Jun-1931 DOA: 12/27/2020 PCP: Venia Carbon, MD    Brief Narrative:  Leon Estes was admitted to the hospitalwith theworking diagnosis of severe sepsis due to [name], chronic cholecystitis.  87 year old male past medical history for coronary artery disease, COPD, chronic kidney disease, hypertension, dyslipidemia and BPH who presented with fevers and weakness. Recent hospitalization for cholecystitis, medically treated 4/16-4/19/2022. At home patient had recurrent fevers and diaphoresis that prompted him to come back to the hospital. On his initial physical examination his blood pressure was 142/88, heart rate 96, respiratory rate 16, temperature 103.1 F rectally oxygenation 90% on room air. His lungs are clear to auscultation bilaterally, heart S1-S2, present, rhythmic, abdomen soft nontender, no lower extremity edema.  Sodium 139, potassium 4.1, chloride 106, bicarb 24, glucose 133, BUN 22, creatinine 1.48, lactic acid 3.2,white count 15.3, hemoglobin 12.9, hematocrit 42.1, platelets 238. SARS COVID-19 negative.  Urinalysis specific gravity 1.039, 0-5 white cells>300 protein.  Stool positive for C. Difficile.  CT of the abdomen and pelvis with prostatomegaly, stable appearance of gallbladder with mild, stable central intrahepatic biliary dilatation.  Patient has developed colonic dilatation, concerned for early toxic megacolon.Placed onoral and rectal vancomycin plus IV metronidazole. Surgery has recommended medical therapy.  Patient very weak and deconditioned, poor oral intake, currently on TPN for supportive nutrition.  06/01Colitis slowly improving, transitioned to oral vancomycin and discontinue vancomycin enemas.  Persistent hyperkalemia due to significant GI losses    Assessment & Plan:   Principal Problem:   Colitis due to Clostridioides difficile Active Problems:   CAD  (coronary artery disease)   Benign essential HTN   COPD (chronic obstructive pulmonary disease) (HCC)   Chronic renal disease, stage III (HCC)   BPH (benign prostatic hyperplasia)   Severe sepsis (HCC)   HOH (hard of hearing)   Acute on chronic cholecystitis   Hypokalemia   Acute delirium   Palliative care by specialist   Goals of care, counseling/discussion   General weakness   Toxic megacolon ([place])     1.Severe sepsis due to [name](end-organ damage AKI and metabolic encephalopathy)toxic megacolon. Continue to have profuse diarrhea, his abdomen is more soft and he is tolerating po well, but poor oral intake. Continue to be very weak and deconditioned   Due to persistent diarrhea will continue oral vancomycin and IV metronidazole. Patient will need a prolonged therapy with oral Vancomycin at discharge.    Continue to monitor stool output and po intake.  Plan for dc home with family when diarrhea improves.   2. AKI with Hypokalemia/ hypernatremia/ hypomagnesemia/ non anion gap metabolic acidosis. Persistent hypokalemia due to GI losses, serum K today down to 2,9, renal function with serum cr is 0,74 with bicarbonate of 24.  Continue aggressive K correction with Kcl 80 meq today and follow up K this pm, check electrolytes in am, including serum Mg.   3. Acute metabolic encephalopathy, delirium Encephalopathy has resolved, his mentation is back to baseline, he is very weak and deconditioned   Continue with dysphagia 3 diet and aspiration precautions.   4. HTN/ dyslipidemiacontinue blood pressure monitoring Continue withsimvastatin.   5, COPDNo signs acute clinical  exacerbation.Continue with Breo Ellipta  6. Paroxysmal atrial fibrillation.No anticoagulation due to risk of bleeding and fall risk.  Continue rate control with metoprolol (change to 50 mg po bid) and antiplatelet therapy with aspirin. Keep K at 4 and Mg at 2. Continue telemetry  monitoring  while correcting electrolytes.   7. T2DM/ dyslipidemia.stable glucose, fasting this am is 94, continue with insulin sliding scale for glucose cover and monitoring.   On simvastatin.     Patient continue to be at high risk for worsening c diff diarrhea, and hypokalemia.   Status is: Inpatient  Remains inpatient appropriate because:Inpatient level of care appropriate due to severity of illness   Dispo:  Patient From: Home  Planned Disposition: Home with Health Care Svc  Medically stable for discharge: No         DVT prophylaxis: Enoxaparin   Code Status:   DNR   Family Communication:  I spoke with patient's wife and daughter at the bedside, we talked in detail about patient's condition, plan of care and prognosis and all questions were addressed.      Nutrition Status: Nutrition Problem: Increased nutrient needs Etiology: acute illness,post-op healing Signs/Symptoms: estimated needs Interventions: Boost Breeze,Ensure Enlive (each supplement provides 350kcal and 20 grams of protein),TPN    Consultants:   Cardiology   Antimicrobials:   Oral vancomycin     Subjective: Patient is feeling tired and fatigued, no nausea or vomiting, poor oral intake, no chest pain or palpitations, no dyspnea. Continue to have diarrhea.   Objective: Vitals:   01/13/21 1901 01/13/21 2055 01/14/21 0506 01/14/21 0843  BP: 122/65  136/64   Pulse: 72  99   Resp:   20   Temp:   97.8 F (36.6 C)   TempSrc:   Oral   SpO2:  95% 98% 95%  Weight:   75 kg   Height:        Intake/Output Summary (Last 24 hours) at 01/14/2021 1221 Last data filed at 01/14/2021 0824 Gross per 24 hour  Intake 360 ml  Output 600 ml  Net -240 ml   Filed Weights   01/11/21 0500 01/13/21 0458 01/14/21 0506  Weight: 76.2 kg 74.6 kg 75 kg    Examination:   General: Not in pain or dyspnea, deconditioned  Neurology: Awake and alert, non focal, mild confusion but not agitated.  E ENT: mild  pallor, no icterus, oral mucosa moist Cardiovascular: No JVD. S1-S2 present, rhythmic, no gallops, rubs, or murmurs. No lower extremity edema. Pulmonary: positive breath sounds bilaterally, adequate air movement, no wheezing, rhonchi or rales. Gastrointestinal. Abdomen mild distended but soft no guarding or rebound Skin. No rashes Musculoskeletal: no joint deformities     Data Reviewed: I have personally reviewed following labs and imaging studies  CBC: Recent Labs  Lab 01/08/21 0310 01/09/21 0400 01/11/21 0253 01/12/21 0416  WBC 13.3* 12.3* 6.9 8.1  NEUTROABS  --  9.6* 4.8 5.9  HGB 10.3* 10.4* 9.0* 9.1*  HCT 31.4* 32.3* 28.1* 28.0*  MCV 86.0 86.4 88.6 88.3  PLT 200 185 175 993   Basic Metabolic Panel: Recent Labs  Lab 01/08/21 0310 01/08/21 1800 01/09/21 0400 01/09/21 1818 01/10/21 0436 01/11/21 0253 01/11/21 0830 01/11/21 1900 01/12/21 0416 01/13/21 0344 01/13/21 1800 01/14/21 0335  NA 139   < > 138  --  136 137  --  137 138 140  --  140  K 2.9*   < > 2.8*   < > 2.9* 2.7*  --  2.8* 3.2* 2.9* 3.4* 2.9*  CL 111   < > 108  --  107 107  --  107 109 110  --  111  CO2 24   < > 25  --  25 26  --  26 22 24   --  24  GLUCOSE 119*   < > 146*  --  159* 202*  --  143* 121* 101*  --  94  BUN 19   < > 16  --  18 22  --  21 18 15   --  14  CREATININE 0.66   < > 0.76  --  0.94 0.85  --  0.82 0.75 0.86  --  0.74  CALCIUM 7.8*   < > 7.7*  --  7.5* 7.5*  --  7.6* 7.7* 7.8*  --  7.7*  MG 1.7  --  1.8  --  1.9 1.9  --   --  2.0  --   --   --   PHOS 2.4*  --  2.8  --   --   --  2.2*  --  3.4  --   --   --    < > = values in this interval not displayed.   GFR: Estimated Creatinine Clearance: 60.8 mL/min (by C-G formula based on SCr of 0.74 mg/dL). Liver Function Tests: Recent Labs  Lab 01/09/21 0400 01/12/21 0416  AST 19 22  ALT 26 23  ALKPHOS 37* 36*  BILITOT 0.6 0.2*  PROT 5.0* 4.6*  ALBUMIN 2.5* 2.4*   No results for input(s): LIPASE, AMYLASE in the last 168 hours. No  results for input(s): AMMONIA in the last 168 hours. Coagulation Profile: No results for input(s): INR, PROTIME in the last 168 hours. Cardiac Enzymes: No results for input(s): CKTOTAL, CKMB, CKMBINDEX, TROPONINI in the last 168 hours. BNP (last 3 results) No results for input(s): PROBNP in the last 8760 hours. HbA1C: No results for input(s): HGBA1C in the last 72 hours. CBG: Recent Labs  Lab 01/11/21 1811 01/11/21 2150 01/12/21 0507 01/12/21 1457 01/13/21 1323  GLUCAP 135* 139* 125* 183* 104*   Lipid Profile: No results for input(s): CHOL, HDL, LDLCALC, TRIG, CHOLHDL, LDLDIRECT in the last 72 hours. Thyroid Function Tests: No results for input(s): TSH, T4TOTAL, FREET4, T3FREE, THYROIDAB in the last 72 hours. Anemia Panel: No results for input(s): VITAMINB12, FOLATE, FERRITIN, TIBC, IRON, RETICCTPCT in the last 72 hours.    Radiology Studies: I have reviewed all of the imaging during this hospital visit personally     Scheduled Meds: . aspirin EC  81 mg Oral Daily  . budesonide (PULMICORT) nebulizer solution  0.5 mg Nebulization BID  . Chlorhexidine Gluconate Cloth  6 each Topical Daily  . enoxaparin (LOVENOX) injection  40 mg Subcutaneous Q24H  . feeding supplement  1 Container Oral Q24H  . feeding supplement  237 mL Oral BID BM  . mouth rinse  15 mL Mouth Rinse BID  . metoprolol tartrate  25 mg Oral Q6H  . potassium chloride  40 mEq Oral Q4H  . simvastatin  40 mg Oral QPM  . sodium chloride flush  10-40 mL Intracatheter Q12H  . sodium chloride flush  3 mL Intravenous Q12H  . tamsulosin  0.4 mg Oral Daily  . vancomycin  500 mg Oral Q6H   Continuous Infusions: . sodium chloride 250 mL (01/09/21 0751)  . metronidazole 500 mg (01/14/21 0517)     LOS: 18 days        Ninfa Giannelli Gerome Apley, MD

## 2021-01-15 LAB — BASIC METABOLIC PANEL
Anion gap: 6 (ref 5–15)
BUN: 13 mg/dL (ref 8–23)
CO2: 22 mmol/L (ref 22–32)
Calcium: 7.7 mg/dL — ABNORMAL LOW (ref 8.9–10.3)
Chloride: 111 mmol/L (ref 98–111)
Creatinine, Ser: 0.94 mg/dL (ref 0.61–1.24)
GFR, Estimated: >60 mL/min (ref >60)
Glucose, Bld: 105 mg/dL — ABNORMAL HIGH (ref 70–99)
Potassium: 3 mmol/L — ABNORMAL LOW (ref 3.5–5.1)
Sodium: 139 mmol/L (ref 135–145)

## 2021-01-15 LAB — MAGNESIUM: Magnesium: 1.5 mg/dL — ABNORMAL LOW (ref 1.7–2.4)

## 2021-01-15 MED ORDER — MAGNESIUM SULFATE 4 GM/100ML IV SOLN
4.0000 g | Freq: Once | INTRAVENOUS | Status: AC
  Administered 2021-01-15: 4 g via INTRAVENOUS
  Filled 2021-01-15: qty 100

## 2021-01-15 MED ORDER — POTASSIUM CHLORIDE CRYS ER 20 MEQ PO TBCR
40.0000 meq | EXTENDED_RELEASE_TABLET | ORAL | Status: AC
  Administered 2021-01-15 (×2): 40 meq via ORAL
  Filled 2021-01-15 (×2): qty 2

## 2021-01-15 MED ORDER — POTASSIUM CHLORIDE CRYS ER 20 MEQ PO TBCR
40.0000 meq | EXTENDED_RELEASE_TABLET | Freq: Once | ORAL | Status: AC
  Administered 2021-01-15: 40 meq via ORAL
  Filled 2021-01-15: qty 2

## 2021-01-15 NOTE — Progress Notes (Signed)
PROGRESS NOTE    Leon Estes  UQJ:[id number] DOB: [birth year] DOA: 12/27/2020 PCP: Venia Carbon, MD    Brief Narrative:  Mr. Leon Estes was admitted to the hospitalwith theworking diagnosis of severe sepsis due to [name], chronic cholecystitis.  87 year old male past medical history for coronary artery disease, COPD, chronic kidney disease, hypertension, dyslipidemia and BPH who presented with fevers and weakness. Recent hospitalization for cholecystitis, medically treated 4/16-4/19/2022. At home patient had recurrent fevers and diaphoresis that prompted him to come back to the hospital. On his initial physical examination his blood pressure was 142/88, heart rate 96, respiratory rate 16, temperature 103.1 F rectally oxygenation 90% on room air. His lungs are clear to auscultation bilaterally, heart S1-S2, present, rhythmic, abdomen soft nontender, no lower extremity edema.  Sodium 139, potassium 4.1, chloride 106, bicarb 24, glucose 133, BUN 22, creatinine 1.48, lactic acid 3.2,white count 15.3, hemoglobin 12.9, hematocrit 42.1, platelets 238. SARS COVID-19 negative.  Urinalysis specific gravity 1.039, 0-5 white cells>300 protein.  Stool positive for C. Difficile.  CT of the abdomen and pelvis with prostatomegaly, stable appearance of gallbladder with mild, stable central intrahepatic biliary dilatation.  Patient has developed colonic dilatation, concerned for early toxic megacolon.Placed onoral and rectal vancomycin plus IV metronidazole. Surgery has recommended medical therapy.  Patient very weak and deconditioned, poor oral intake, currently on TPN for supportive nutrition.  06/01Colitis slowly improving, transitioned to oral vancomycin and discontinue vancomycin enemas.  Persistent hyperkalemia due to significant GI losses  Plan to dc home with home health services when diarrhea improves and serum K more stable above 3,5   Assessment &  Plan:   Principal Problem:   Colitis due to Clostridioides difficile Active Problems:   CAD (coronary artery disease)   Benign essential HTN   COPD (chronic obstructive pulmonary disease) (HCC)   Chronic renal disease, stage III (HCC)   BPH (benign prostatic hyperplasia)   Severe sepsis (HCC)   HOH (hard of hearing)   Acute on chronic cholecystitis   Hypokalemia   Acute delirium   Palliative care by specialist   Goals of care, counseling/discussion   General weakness   Toxic megacolon ([place])     1.Severe sepsis due to [name](end-organ damage AKI and metabolic encephalopathy)toxic megacolon. Patient continue to have profuse diarrhea, his abdomen is soft, poor oral intake, but no nausea or vomiting.   Continue oral vancomycin, will need prolonged therapy until improvement of diarrhea.  Continue with IV metronidazole while hospitalized.    Continue supportive care. His diarrhea needs to improve before patient can be discharge due to risk of hypovolemia and hypokalemia,.   2. AKI with Hypokalemia/ hypernatremia/ hypomagnesemia/ non anion gap metabolic acidosis. Patient continue to have large output of watery diarrhea. His renal function is stable with serum cr at 0.94, K is 3,0 and serum bicarbonate 22. Cl 111 and Na 139. Mg is 1,5.  Continue to encourage po intake, holding IV fluids for now, Add Kcl 40 meq x3 today and 4 g Mag sulfate. Continue close follow up renal function and electrolytes in am.   3. Acute metabolic encephalopathy, delirium On dysphagia 3diet and aspiration precautions. Patient continue to be very weak and deconditioned. Mild confusion but not agitation.   4. HTN/ dyslipidemiablood pressure 144/99 mmHg,  Continue with statin therapy.   5, COPDNosigns of acute clinical exacerbation.On Breo Ellipta  6. Paroxysmal atrial fibrillation.No anticoagulation due to risk of bleeding and fall risk.   Heart rate at 80 bpm  range,  rate control with metoprolol 50 mg po bid.  Continue to hold on anticoagulation for now due to risk of bleeding in the setting of C diff colitis.   Continue to correct electrolytes. Target k of 4 and Mg at 2.   7. T2DM/ dyslipidemia.Continue glucose cover and monitoring with insulin sliding scale. Patient with poor oral intake.  Fasting glucose today 105.    Continue with simvastatin.   Patient continue to be at high risk for worsening c diff and hypokalemia.   Status is: Inpatient  Remains inpatient appropriate because:IV treatments appropriate due to intensity of illness or inability to take PO   Dispo:  Patient From: Home  Planned Disposition: Home with Health Care Svc  Medically stable for discharge: No     DVT prophylaxis: Enoxaparin   Code Status:   DNR   Family Communication:  I spoke with patient's wife at the bedside, we talked in detail about patient's condition, plan of care and prognosis and all questions were addressed.   Nutrition Status: Nutrition Problem: Increased nutrient needs Etiology: acute illness,post-op healing Signs/Symptoms: estimated needs Interventions: Boost Breeze,Ensure Enlive (each supplement provides 350kcal and 20 grams of protein),TPN    Consultants:   Surgery     Antimicrobials:   Oral vancomycin   IV metronidazole     Subjective: Patient continue to be very weak and deconditioned, positive large output of watery diarrhea, no nausea or vomiting, poor oral intake   Objective: Vitals:   01/15/21 0601 01/15/21 0753 01/15/21 0755 01/15/21 1056  BP: (!) 148/70   (!) 144/99  Pulse: 98   87  Resp: 19     Temp: 97.9 F (36.6 C)     TempSrc: Oral     SpO2: 98% 98% 98%   Weight: 73.1 kg     Height:        Intake/Output Summary (Last 24 hours) at 01/15/2021 1327 Last data filed at 01/15/2021 1226 Gross per 24 hour  Intake 865 ml  Output 1800 ml  Net -935 ml   Filed Weights   01/13/21 0458 01/14/21 0506 01/15/21 0601   Weight: 74.6 kg 75 kg 73.1 kg    Examination:   General: Not in pain or dyspnea, deconditioned  Neurology: Awake and alert, non focal  E ENT: mild pallor, no icterus, oral mucosa moist Cardiovascular: No JVD. S1-S2 present, rhythmic, no gallops, rubs, or murmurs. trace lower extremity edema. Pulmonary: positive breath sounds bilaterally, adequate air movement, no wheezing, rhonchi or rales. Gastrointestinal. Abdomen mild distended but not tender, soft  Skin. No rashes Musculoskeletal: no joint deformities     Data Reviewed: I have personally reviewed following labs and imaging studies  CBC: Recent Labs  Lab 01/09/21 0400 01/11/21 0253 01/12/21 0416  WBC 12.3* 6.9 8.1  NEUTROABS 9.6* 4.8 5.9  HGB 10.4* 9.0* 9.1*  HCT 32.3* 28.1* 28.0*  MCV 86.4 88.6 88.3  PLT 185 175 379   Basic Metabolic Panel: Recent Labs  Lab 01/09/21 0400 01/09/21 1818 01/10/21 0436 01/11/21 0253 01/11/21 0830 01/11/21 1900 01/12/21 0416 01/13/21 0344 01/13/21 1800 01/14/21 0335 01/14/21 1835 01/15/21 0328  NA 138  --  136 137  --  137 138 140  --  140  --  139  K 2.8*   < > 2.9* 2.7*  --  2.8* 3.2* 2.9* 3.4* 2.9* 3.0* 3.0*  CL 108  --  107 107  --  107 109 110  --  111  --  111  CO2 25  --  25 26  --  26 22 24   --  24  --  22  GLUCOSE 146*  --  159* 202*  --  143* 121* 101*  --  94  --  105*  BUN 16  --  18 22  --  21 18 15   --  14  --  13  CREATININE 0.76  --  0.94 0.85  --  0.82 0.75 0.86  --  0.74  --  0.94  CALCIUM 7.7*  --  7.5* 7.5*  --  7.6* 7.7* 7.8*  --  7.7*  --  7.7*  MG 1.8  --  1.9 1.9  --   --  2.0  --   --   --   --  1.5*  PHOS 2.8  --   --   --  2.2*  --  3.4  --   --   --   --   --    < > = values in this interval not displayed.   GFR: Estimated Creatinine Clearance: 51.8 mL/min (by C-G formula based on SCr of 0.94 mg/dL). Liver Function Tests: Recent Labs  Lab 01/09/21 0400 01/12/21 0416  AST 19 22  ALT 26 23  ALKPHOS 37* 36*  BILITOT 0.6 0.2*  PROT 5.0*  4.6*  ALBUMIN 2.5* 2.4*   No results for input(s): LIPASE, AMYLASE in the last 168 hours. No results for input(s): AMMONIA in the last 168 hours. Coagulation Profile: No results for input(s): INR, PROTIME in the last 168 hours. Cardiac Enzymes: No results for input(s): CKTOTAL, CKMB, CKMBINDEX, TROPONINI in the last 168 hours. BNP (last 3 results) No results for input(s): PROBNP in the last 8760 hours. HbA1C: No results for input(s): HGBA1C in the last 72 hours. CBG: Recent Labs  Lab 01/11/21 1811 01/11/21 2150 01/12/21 0507 01/12/21 1457 01/13/21 1323  GLUCAP 135* 139* 125* 183* 104*   Lipid Profile: No results for input(s): CHOL, HDL, LDLCALC, TRIG, CHOLHDL, LDLDIRECT in the last 72 hours. Thyroid Function Tests: No results for input(s): TSH, T4TOTAL, FREET4, T3FREE, THYROIDAB in the last 72 hours. Anemia Panel: No results for input(s): VITAMINB12, FOLATE, FERRITIN, TIBC, IRON, RETICCTPCT in the last 72 hours.    Radiology Studies: I have reviewed all of the imaging during this hospital visit personally     Scheduled Meds: . aspirin EC  81 mg Oral Daily  . budesonide (PULMICORT) nebulizer solution  0.5 mg Nebulization BID  . Chlorhexidine Gluconate Cloth  6 each Topical Daily  . enoxaparin (LOVENOX) injection  40 mg Subcutaneous Q24H  . feeding supplement  1 Container Oral Q24H  . feeding supplement  237 mL Oral BID BM  . mouth rinse  15 mL Mouth Rinse BID  . metoprolol tartrate  50 mg Oral BID  . potassium chloride  40 mEq Oral Q4H  . simvastatin  40 mg Oral QPM  . sodium chloride flush  10-40 mL Intracatheter Q12H  . sodium chloride flush  3 mL Intravenous Q12H  . tamsulosin  0.4 mg Oral Daily  . vancomycin  500 mg Oral Q6H   Continuous Infusions: . sodium chloride 250 mL (01/09/21 0751)  . metronidazole 500 mg (01/15/21 0629)     LOS: 19 days        Malaney Mcbean Gerome Apley, MD

## 2021-01-15 NOTE — Progress Notes (Signed)
Daily Progress Note   Patient Name: Leon Estes       Date: 01/15/2021 DOB: 02-13-1931  Age: 87 y.o. MRN#: [id number] Attending Physician: Tawni Millers Primary Care Physician: Venia Carbon, MD Admit Date: 12/27/2020  Reason for Consultation/Follow-up: Establishing goals of care  Subjective: Chart reviewed. Assessed patient at the bedside. He denies pain or distress. He reports a poor appetite this morning, although he was able to eat a few bites. He remains hopeful to return home soon.  I then called patient's wife Leon Estes to provide support and discuss her thoughts and expectations regarding his current illness. She understands the rationale for deferring discharge and remains hopeful that he will continue to improve with medical interventions. She states "I want him to come home when he is ready, I don't want to have to bring him right back." She is well-supported by family and appreciative of ongoing discussions with PMT. Questions and concerns addressed.    Length of Stay: 19  Current Medications: Scheduled Meds:  . aspirin EC  81 mg Oral Daily  . budesonide (PULMICORT) nebulizer solution  0.5 mg Nebulization BID  . Chlorhexidine Gluconate Cloth  6 each Topical Daily  . enoxaparin (LOVENOX) injection  40 mg Subcutaneous Q24H  . feeding supplement  1 Container Oral Q24H  . feeding supplement  237 mL Oral BID BM  . mouth rinse  15 mL Mouth Rinse BID  . metoprolol tartrate  50 mg Oral BID  . potassium chloride  40 mEq Oral Q4H  . simvastatin  40 mg Oral QPM  . sodium chloride flush  10-40 mL Intracatheter Q12H  . sodium chloride flush  3 mL Intravenous Q12H  . tamsulosin  0.4 mg Oral Daily  . vancomycin  500 mg Oral Q6H    Continuous Infusions: . sodium chloride  250 mL (01/09/21 0751)  . magnesium sulfate bolus IVPB    . metronidazole 500 mg (01/15/21 0629)    PRN Meds: sodium chloride, acetaminophen, albuterol, fluticasone furoate-vilanterol, ondansetron (ZOFRAN) IV, sodium chloride flush  General: Alert, awake, in no acute distress. Heart: Regular rate.  Lungs:  Normal respiratory effort. Ext: No significant edema. Moves all extremities spontaneously. Skin: Warm and dry. Neuro: mental status at baseline.   Vital Signs: BP (!) 148/70 (BP Location: Left Arm)   Pulse  98   Temp 97.9 F (36.6 C) (Oral)   Resp 19   Ht 5\' 7"  (1.702 m)   Wt 73.1 kg   SpO2 98%   BMI 25.24 kg/m  SpO2: SpO2: 98 % O2 Device: O2 Device: Room Air O2 Flow Rate: O2 Flow Rate (L/min): 2 L/min  Intake/output summary:   Intake/Output Summary (Last 24 hours) at 01/15/2021 0920 Last data filed at 01/15/2021 0600 Gross per 24 hour  Intake 650 ml  Output 1800 ml  Net -1150 ml   LBM: Last BM Date: 01/15/21 Baseline Weight: Weight: 71.2 kg Most recent weight: Weight: 73.1 kg      Flowsheet Rows   Flowsheet Row Most Recent Value  Intake Tab   Referral Department Hospitalist  Unit at Time of Referral ICU  Palliative Care Primary Diagnosis Sepsis/Infectious Disease  Date Notified 12/30/20  Palliative Care Type New Palliative care  Reason for referral Clarify Goals of Care  Date of Admission 12/27/20  Date first seen by Palliative Care 12/31/20  # of days Palliative referral response time 1 Day(s)  # of days IP prior to Palliative referral 3  Clinical Assessment   Palliative Performance Scale Score 20%  Psychosocial & Spiritual Assessment   Palliative Care Outcomes   Patient/Family meeting held? Yes  Who was at the meeting? Wife, son, daughter  Palliative Care Outcomes Clarified goals of care      Patient Active Problem List   Diagnosis Date Noted  . Toxic megacolon ([place])   . Palliative care by specialist   . Goals of care, counseling/discussion   .  General weakness   . Colitis due to Clostridioides difficile 12/31/2020  . Acute delirium 12/31/2020  . HOH (hard of hearing) 12/28/2020  . Acute on chronic cholecystitis 12/28/2020  . Hypokalemia 12/28/2020  . Severe sepsis ([place]) 12/27/2020  . Arrhythmia 12/06/2020  . Pulmonary nodule 12/06/2020  . Acute cholecystitis 11/26/2020  . Preventative health care 10/12/2020  . Macular degeneration, wet ([place]) 10/12/2020  . Acquired claw toe, left 02/02/2020  . BPH (benign prostatic hyperplasia) 01/13/2020  . Neuropathy 02/18/2018  . COPD (chronic obstructive pulmonary disease) ([place])   . Chronic renal disease, stage III ([place])   . Benign essential HTN 04/09/2017  . CAD (coronary artery disease) 03/03/2015  . Hyperlipidemia 03/03/2015    Palliative Care Assessment & Plan   Patient Profile: 87 y.o. male  with past medical history of CHF, COPD, choledocholithiasis, CAD status post PCI, COPD, CKD, hypertension, hyperlipidemia, BPH admitted on 12/27/2020 with sepsis.  He was recently admitted to [facility] 4/16 through 4/19 for presumed cholecystitis.  He was being followed by Dr. Redmond Pulling for cholecystectomy later this month.  Initially, there was concern that his presentation was related to cholecystitis again, however, with further work-up it appears that this is actually more from C. difficile.  His hospital course has been further complicated by A. fib, COPD exacerbation, and development of agitation/delirium.  Palliative consulted for goals of care.  Recommendations/Plan: - Continue current interventions. Goal remains to stabilize for discharge home. - Home health and outpatient palliative have been arranged for time of discharge, appreciate TOC assistance. - PMT will continue to support holistically.   Code Status:  DNR  Code Status Orders  (From admissi  on, onward)         Start     Ordered   12/29/20 1002  Do not attempt resuscitation (DNR)  Continuous       Question Answer Comment   In  the event of cardiac or respiratory ARREST Do not call a "code blue"   In the event of cardiac or respiratory ARREST Do not perform Intubation, CPR, defibrillation or ACLS   In the event of cardiac or respiratory ARREST Use medication by any route, position, wound care, and other measures to relive pain and suffering. May use oxygen, suction and manual treatment of airway obstruction as needed for comfort.      12/29/20 1001        Code Status History    Date Active Date Inactive Code Status Order ID Comments User Context   12/27/2020 2202 12/29/2020 1001 Full Code [id number]  Lenore Cordia, MD ED   11/26/2020 1947 11/29/2020 1648 Full Code [id number]  Kayleen Memos, DO ED   Advance Care Planning Activity     Prognosis:  Guarded  Discharge Planning:  Home with Home Health and outpatient palliative care  Care plan was discussed with patient, wife  Total time: 15 minutes Greater than 50% of this time was spent in counseling and coordinating care related to the above assessment and plan.  Dorthy Cooler, PA-C Palliative Medicine Team Team phone # [phone]  Thank you for allowing the Palliative Medicine Team to assist in the care of this patient. Please utilize secure chat with additional questions, if there is no response within 30 minutes please call the above phone number.  Palliative Medicine Team providers are available by phone from 7am to 7pm daily and can be reached through the team cell phone.  Should this patient require assistance outside of these hours, please call the patient's attending physician.

## 2021-01-16 LAB — BASIC METABOLIC PANEL
Anion gap: 7 (ref 5–15)
BUN: 11 mg/dL (ref 8–23)
CO2: 22 mmol/L (ref 22–32)
Calcium: 7.7 mg/dL — ABNORMAL LOW (ref 8.9–10.3)
Chloride: 111 mmol/L (ref 98–111)
Creatinine, Ser: 0.92 mg/dL (ref 0.61–1.24)
GFR, Estimated: >60 mL/min (ref >60)
Glucose, Bld: 96 mg/dL (ref 70–99)
Potassium: 2.7 mmol/L — CL (ref 3.5–5.1)
Sodium: 140 mmol/L (ref 135–145)

## 2021-01-16 LAB — MAGNESIUM: Magnesium: 1.9 mg/dL (ref 1.7–2.4)

## 2021-01-16 MED ORDER — POTASSIUM CHLORIDE CRYS ER 20 MEQ PO TBCR
40.0000 meq | EXTENDED_RELEASE_TABLET | ORAL | Status: AC
Start: 2021-01-16 — End: 2021-01-16
  Administered 2021-01-16 (×3): 40 meq via ORAL
  Filled 2021-01-16 (×3): qty 2

## 2021-01-16 MED ORDER — POTASSIUM CHLORIDE CRYS ER 20 MEQ PO TBCR
40.0000 meq | EXTENDED_RELEASE_TABLET | ORAL | Status: DC
Start: 2021-01-16 — End: 2021-01-16
  Administered 2021-01-16: 40 meq via ORAL
  Filled 2021-01-16: qty 2

## 2021-01-16 MED ORDER — POLYVINYL ALCOHOL 1.4 % OP SOLN
1.0000 [drp] | OPHTHALMIC | Status: DC | PRN
  Administered 2021-01-17: 1 [drp] via OPHTHALMIC
  Filled 2021-01-16: qty 15

## 2021-01-16 MED ORDER — MAGNESIUM SULFATE 2 GM/50ML IV SOLN
2.0000 g | Freq: Once | INTRAVENOUS | Status: AC
  Administered 2021-01-16: 2 g via INTRAVENOUS
  Filled 2021-01-16: qty 50

## 2021-01-16 NOTE — Progress Notes (Signed)
Physical Therapy Treatment Patient Details Name: Leon Estes MRN: [id number] DOB: Jan 07, 1931 Today's Date: 01/16/2021    History of Present Illness 87 year old male with history of CAD status post stenting to RCA in 1997, COPD, CKD stage III, hypertension, hyperlipidemia, BPH, recent admission at [facility] from 11/26/2020-11/29/2020 with presumed acute cholecystitis ,presented on 12/27/2020 with fever and generalized weakness.Hospital course has been complicated by delirium/agitation along with A. fib with RVR, C Diff., abdominal distention with imaging showing concern for toxic megacolon    PT Comments    Pt  Agreeable to OOB, able to take some steps with RW but fatigues easily. Will continue to follow   Follow Up Recommendations   SNF vs HHPT     Equipment Recommendations  None recommended by PT    Recommendations for Other Services       Precautions / Restrictions Precautions Precautions: Fall Precaution Comments: wife reports 2-3 recent falls Restrictions Weight Bearing Restrictions: No    Mobility  Bed Mobility Overal bed mobility: Needs Assistance Bed Mobility: Supine to Sit     Supine to sit: Min assist;HOB elevated     General bed mobility comments: assist to initiate LEs off EOB, then able to use bed rail to brign trunk to upright    Transfers Overall transfer level: Needs assistance Equipment used: Rolling walker (2 wheeled) Transfers: Sit to/from Stand Sit to Stand: Min assist         General transfer comment: verbal cues for hand placement and to power up with LEs  Ambulation/Gait Ambulation/Gait assistance: Min assist Gait Distance (Feet): 6 Feet Assistive device: Rolling walker (2 wheeled) Gait Pattern/deviations: Step-through pattern;Trunk flexed;Decreased stride length     General Gait Details: pre-gait activities with RW support. amb few steps fwd and back to chair   Stairs             Wheelchair Mobility    Modified Rankin (Stroke  Patients Only)       Balance     Sitting balance-Leahy Scale: Fair     Standing balance support: Bilateral upper extremity supported;During functional activity Standing balance-Leahy Scale: Poor Standing balance comment: reliant on UE support                            Cognition Arousal/Alertness: Awake/alert Behavior During Therapy: WFL for tasks assessed/performed Overall Cognitive Status: Within Functional Limits for tasks assessed                                 General Comments: A & O x3 today      Exercises General Exercises - Lower Extremity Ankle Circles/Pumps: AROM;Both;10 reps;Seated    General Comments        Pertinent Vitals/Pain Pain Assessment: No/denies pain    Home Living                      Prior Function            PT Goals (current goals can now be found in the care plan section) Acute Rehab PT Goals Patient Stated Goal: ready to get out of bed, go home PT Goal Formulation: With patient Time For Goal Achievement: 01/21/21 Potential to Achieve Goals: Good Progress towards PT goals: Progressing toward goals    Frequency    Min 2X/week      PT Plan Current plan remains appropriate  Co-evaluation              AM-PAC PT "6 Clicks" Mobility   Outcome Measure  Help needed turning from your back to your side while in a flat bed without using bedrails?: A Little Help needed moving from lying on your back to sitting on the side of a flat bed without using bedrails?: A Little Help needed moving to and from a bed to a chair (including a wheelchair)?: A Little Help needed standing up from a chair using your arms (e.g., wheelchair or bedside chair)?: A Little Help needed to walk in hospital room?: A Little Help needed climbing 3-5 steps with a railing? : A Little 6 Click Score: 18    End of Session Equipment Utilized During Treatment: Gait belt Activity Tolerance: Patient tolerated treatment  well;Patient limited by fatigue Patient left: in chair;with call bell/phone within reach;with chair alarm set Nurse Communication: Mobility status PT Visit Diagnosis: Unsteadiness on feet (R26.81);Difficulty in walking, not elsewhere classified (R26.2)     Time: 2703-5009 PT Time Calculation (min) (ACUTE ONLY): 23 min  Charges:  $Gait Training: 8-22 mins $Therapeutic Activity: 8-22 mins                     Baxter Flattery, PT  Acute Rehab Dept ([place]) [phone] Pager [phone]  01/16/2021    [facility] 01/16/2021, 4:39 PM

## 2021-01-16 NOTE — Plan of Care (Signed)
  Problem: Coping: Goal: Level of anxiety will decrease Outcome: Progressing   Problem: Elimination: Goal: Will not experience complications related to bowel motility Outcome: Progressing Goal: Will not experience complications related to urinary retention Outcome: Progressing   Problem: Pain Managment: Goal: General experience of comfort will improve Outcome: Progressing   Problem: Safety: Goal: Ability to remain free from injury will improve Outcome: Progressing   Problem: Skin Integrity: Goal: Risk for impaired skin integrity will decrease Outcome: Progressing   Problem: Safety: Goal: Non-violent Restraint(s) Outcome: Progressing

## 2021-01-16 NOTE — Progress Notes (Addendum)
PROGRESS NOTE    Leon Estes  ZMO:[id number] DOB: September 20, 1930 DOA: 12/27/2020 PCP: Venia Carbon, MD    Brief Narrative:  Mr. Goehring was admitted to the hospitalwith theworking diagnosis of severe sepsis due to [name], on chronic cholecystitis. Prolonged hospitalization complicated with delirium, atrial fibrillation and persistent hypokalemia due to large GI losses.   87 year old male past medical history for coronary artery disease, COPD, chronic kidney disease, hypertension, dyslipidemia and BPH who presented with fevers and weakness. Recent hospitalization for cholecystitis, medically treated 4/16-4/19/2022. At home patient had recurrent fevers and diaphoresis that prompted him to come back to the hospital. On his initial physical examination his blood pressure was 142/88, heart rate 96, respiratory rate 16, temperature 103.1 F rectally oxygenation 90% on room air. His lungs are clear to auscultation bilaterally, heart S1-S2, present, rhythmic, abdomen soft nontender, no lower extremity edema.  Sodium 139, potassium 4.1, chloride 106, bicarb 24, glucose 133, BUN 22, creatinine 1.48, lactic acid 3.2,white count 15.3, hemoglobin 12.9, hematocrit 42.1, platelets 238. SARS COVID-19 negative.  Urinalysis specific gravity 1.039, 0-5 white cells>300 protein.  Stool positive for C. Difficile.  CT of the abdomen and pelvis with prostatomegaly, stable appearance of gallbladder with mild, stable central intrahepatic biliary dilatation.  Patient has developed colonic dilatation, concerned for early toxic megacolon.Placed onoral and rectal vancomycin plus IV metronidazole. Surgery has recommended medical therapy.  Patient very weak and deconditioned, poor oral intake, currently on TPN for supportive nutrition.  06/01Colitis slowly improving, transitioned to oral vancomycin and discontinue vancomycin enemas.  Persistent hyperkalemia due tosignificantGI  losses  Plan to dc home with home health services when diarrhea improves and serum K more stable above 3,5   Assessment & Plan:   Principal Problem:   Colitis due to Clostridioides difficile Active Problems:   CAD (coronary artery disease)   Benign essential HTN   COPD (chronic obstructive pulmonary disease) (HCC)   Chronic renal disease, stage III (HCC)   BPH (benign prostatic hyperplasia)   Severe sepsis (HCC)   HOH (hard of hearing)   Acute on chronic cholecystitis   Hypokalemia   Acute delirium   Palliative care by specialist   Goals of care, counseling/discussion   General weakness   Toxic megacolon ([place])    1.Severe sepsis due to [name](end-organ damage AKI and metabolic encephalopathy)toxic megacolon. His abdomen is more soft and is tolerating better po diet, continue to have watery diarrhea and hypokalemia. Rectal tube in place, bag with small amount of watery brown stool at the time of my examination.   Plan to continue with oral vancomycin,  prolonged therapy expected until improvement of diarrhea.  On IV metronidazole while hospitalized.  Continue to encourage po intake.   2. AKI with Hypokalemia/ hypernatremia/ hypomagnesemia/ non anion gap metabolic acidosis. Persistent hypokalemia due to significant GI losses. Today serum K is 2,7 with serum bicarbonate at 22 and serum Na 140 with CL 111. Mg 1,9  Continue aggressive K correction with KCl, will add 120 meq in 3 divided doses and 2 g Mag sulfate and follow up renal function and electrolytes in am.   3. Acute metabolic encephalopathy, delirium Continue with dysphagia 3diet and aspiration precautions. Mentation is back to baseline delirium has resolved.   4. HTN/ dyslipidemiaOff antihypertensive medications, continue with statin therapy.    5, COPDNosigns acute exacerbation.On Breo Ellipta  6. Paroxysmal atrial fibrillation.No anticoagulation due to risk of bleeding and  fall risk.  Rate control with metoprolol 50 mg  po bid, anticoagulation when patient recovers from C diff.   Replete electrolytes, with target K at 4 and Mg at 2.   7. T2DM/ dyslipidemia.fasting glucose is 96, continue sliding scale for glucose cover and monitoring   On simvastatin.  Patient continue to be at high risk for worsening hypokalemia and C diff   Status is: Inpatient  Remains inpatient appropriate because:Inpatient level of care appropriate due to severity of illness   Dispo:  Patient From: Home  Planned Disposition: Home with Health Care Svc  Medically stable for discharge: No    DVT prophylaxis: Enoxaparin   Code Status:   dnr   Family Communication:  I spoke with patient's wife at the bedside, we talked in detail about patient's condition, plan of care and prognosis and all questions were addressed.      Nutrition Status: Nutrition Problem: Increased nutrient needs Etiology: acute illness,post-op healing Signs/Symptoms: estimated needs Interventions: Boost Breeze,Ensure Enlive (each supplement provides 350kcal and 20 grams of protein),TPN     Consultants:   Surgery    Antimicrobials:   Oral vancomycin and IV metronidazole     Subjective: Patient is feeling better, continue to be very weak and deconditioned, no nausea or vomiting, abdomen non tender and improved appetite.   Objective: Vitals:   01/16/21 0449 01/16/21 0500 01/16/21 0827 01/16/21 1243  BP: (!) 144/73   (!) 149/73  Pulse: 94   72  Resp: 20   18  Temp: 97.7 F (36.5 C)   98 F (36.7 C)  TempSrc: Oral   Oral  SpO2: 95%  96% 97%  Weight:  70.8 kg    Height:        Intake/Output Summary (Last 24 hours) at 01/16/2021 1256 Last data filed at 01/16/2021 0600 Gross per 24 hour  Intake 990 ml  Output 1750 ml  Net -760 ml   Filed Weights   01/14/21 0506 01/15/21 0601 01/16/21 0500  Weight: 75 kg 73.1 kg 70.8 kg    Examination:   General: Not in pain or dyspnea,  deconditioned  Neurology: Awake and alert, non focal  E ENT: mild pallor, no icterus, oral mucosa moist Cardiovascular: No JVD. S1-S2 present, rhythmic, no gallops, rubs, or murmurs. No lower extremity edema. Pulmonary: positive breath sounds bilaterally, adequate air movement, no wheezing, rhonchi or rales. Gastrointestinal. Abdomen soft and non tender to deep palpation.  Skin. No rashes Musculoskeletal: no joint deformities     Data Reviewed: I have personally reviewed following labs and imaging studies  CBC: Recent Labs  Lab 01/11/21 0253 01/12/21 0416  WBC 6.9 8.1  NEUTROABS 4.8 5.9  HGB 9.0* 9.1*  HCT 28.1* 28.0*  MCV 88.6 88.3  PLT 175 809   Basic Metabolic Panel: Recent Labs  Lab 01/10/21 0436 01/11/21 0253 01/11/21 0830 01/11/21 1900 01/12/21 0416 01/13/21 0344 01/13/21 1800 01/14/21 0335 01/14/21 1835 01/15/21 0328 01/16/21 0300  NA 136 137  --    < > 138 140  --  140  --  139 140  K 2.9* 2.7*  --    < > 3.2* 2.9* 3.4* 2.9* 3.0* 3.0* 2.7*  CL 107 107  --    < > 109 110  --  111  --  111 111  CO2 25 26  --    < > 22 24  --  24  --  22 22  GLUCOSE 159* 202*  --    < > 121* 101*  --  94  --  105* 96  BUN 18 22  --    < > 18 15  --  14  --  13 11  CREATININE 0.94 0.85  --    < > 0.75 0.86  --  0.74  --  0.94 0.92  CALCIUM 7.5* 7.5*  --    < > 7.7* 7.8*  --  7.7*  --  7.7* 7.7*  MG 1.9 1.9  --   --  2.0  --   --   --   --  1.5* 1.9  PHOS  --   --  2.2*  --  3.4  --   --   --   --   --   --    < > = values in this interval not displayed.   GFR: Estimated Creatinine Clearance: 52.9 mL/min (by C-G formula based on SCr of 0.92 mg/dL). Liver Function Tests: Recent Labs  Lab 01/12/21 0416  AST 22  ALT 23  ALKPHOS 36*  BILITOT 0.2*  PROT 4.6*  ALBUMIN 2.4*   No results for input(s): LIPASE, AMYLASE in the last 168 hours. No results for input(s): AMMONIA in the last 168 hours. Coagulation Profile: No results for input(s): INR, PROTIME in the last 168  hours. Cardiac Enzymes: No results for input(s): CKTOTAL, CKMB, CKMBINDEX, TROPONINI in the last 168 hours. BNP (last 3 results) No results for input(s): PROBNP in the last 8760 hours. HbA1C: No results for input(s): HGBA1C in the last 72 hours. CBG: Recent Labs  Lab 01/11/21 1811 01/11/21 2150 01/12/21 0507 01/12/21 1457 01/13/21 1323  GLUCAP 135* 139* 125* 183* 104*   Lipid Profile: No results for input(s): CHOL, HDL, LDLCALC, TRIG, CHOLHDL, LDLDIRECT in the last 72 hours. Thyroid Function Tests: No results for input(s): TSH, T4TOTAL, FREET4, T3FREE, THYROIDAB in the last 72 hours. Anemia Panel: No results for input(s): VITAMINB12, FOLATE, FERRITIN, TIBC, IRON, RETICCTPCT in the last 72 hours.    Radiology Studies: I have reviewed all of the imaging during this hospital visit personally     Scheduled Meds: . aspirin EC  81 mg Oral Daily  . budesonide (PULMICORT) nebulizer solution  0.5 mg Nebulization BID  . Chlorhexidine Gluconate Cloth  6 each Topical Daily  . enoxaparin (LOVENOX) injection  40 mg Subcutaneous Q24H  . feeding supplement  1 Container Oral Q24H  . feeding supplement  237 mL Oral BID BM  . mouth rinse  15 mL Mouth Rinse BID  . metoprolol tartrate  50 mg Oral BID  . potassium chloride  40 mEq Oral Q4H  . simvastatin  40 mg Oral QPM  . sodium chloride flush  10-40 mL Intracatheter Q12H  . sodium chloride flush  3 mL Intravenous Q12H  . tamsulosin  0.4 mg Oral Daily  . vancomycin  500 mg Oral Q6H   Continuous Infusions: . sodium chloride 250 mL (01/09/21 0751)  . metronidazole 500 mg (01/16/21 0555)     LOS: 20 days        Tonita Bills Gerome Apley, MD

## 2021-01-17 LAB — BASIC METABOLIC PANEL
Anion gap: 7 (ref 5–15)
BUN: 11 mg/dL (ref 8–23)
CO2: 23 mmol/L (ref 22–32)
Calcium: 8.2 mg/dL — ABNORMAL LOW (ref 8.9–10.3)
Chloride: 112 mmol/L — ABNORMAL HIGH (ref 98–111)
Creatinine, Ser: 0.87 mg/dL (ref 0.61–1.24)
GFR, Estimated: >60 mL/min (ref >60)
Glucose, Bld: 100 mg/dL — ABNORMAL HIGH (ref 70–99)
Potassium: 3.5 mmol/L (ref 3.5–5.1)
Sodium: 142 mmol/L (ref 135–145)

## 2021-01-17 LAB — MAGNESIUM: Magnesium: 2 mg/dL (ref 1.7–2.4)

## 2021-01-17 MED ORDER — HYDROXYZINE HCL 50 MG/ML IM SOLN
25.0000 mg | Freq: Once | INTRAMUSCULAR | Status: AC
  Administered 2021-01-17: 25 mg via INTRAMUSCULAR
  Filled 2021-01-17: qty 0.5

## 2021-01-17 MED ORDER — TRAZODONE HCL 50 MG PO TABS
50.0000 mg | ORAL_TABLET | Freq: Every day | ORAL | Status: DC
  Administered 2021-01-17 – 2021-01-22 (×6): 50 mg via ORAL
  Filled 2021-01-17 (×6): qty 1

## 2021-01-17 MED ORDER — HALOPERIDOL LACTATE 5 MG/ML IJ SOLN
2.0000 mg | Freq: Once | INTRAMUSCULAR | Status: DC
  Filled 2021-01-17: qty 1

## 2021-01-17 MED ORDER — POTASSIUM CHLORIDE CRYS ER 20 MEQ PO TBCR
40.0000 meq | EXTENDED_RELEASE_TABLET | ORAL | Status: AC
  Administered 2021-01-17 (×2): 40 meq via ORAL
  Filled 2021-01-17 (×2): qty 2

## 2021-01-17 NOTE — Progress Notes (Signed)
Physical Therapy Treatment Patient Details Name: Leon Estes MRN: [id number] DOB: 23-Sep-1930 Today's Date: 01/17/2021    History of Present Illness 87 year old male with history of CAD status post stenting to RCA in 1997, COPD, CKD stage III, hypertension, hyperlipidemia, BPH, recent admission at [facility] from 11/26/2020-11/29/2020 with presumed acute cholecystitis ,presented on 12/27/2020 with fever and generalized weakness.Hospital course has been complicated by delirium/agitation along with A. fib with RVR, C Diff., abdominal distention with imaging showing concern for toxic megacolon    PT Comments    Pt in bed with eyes closed, stating he was "asleep". Assisted pt to EOB and he opened eyes, began engaging in conversation. Agreeable to OOB. Once in chair alert, attempting to open drink and wife assisting him with lunch.   Follow Up Recommendations  SNF (wife wants to take pt home)     Equipment Recommendations  None recommended by PT    Recommendations for Other Services       Precautions / Restrictions Precautions Precautions: Fall Precaution Comments: wife reports 2-3 recent falls Restrictions Weight Bearing Restrictions: No    Mobility  Bed Mobility Overal bed mobility: Needs Assistance Bed Mobility: Supine to Sit     Supine to sit: Mod assist     General bed mobility comments: assist to initiate LEs off EOB, then able to use bed rail to bring trunk to upright    Transfers Overall transfer level: Needs assistance Equipment used: Rolling walker (2 wheeled) Transfers: Sit to/from Stand Sit to Stand: Min assist         General transfer comment: verbal cues for hand placement and to power up with LEs  Ambulation/Gait Ambulation/Gait assistance: Min assist Gait Distance (Feet): 5 Feet Assistive device: Rolling walker (2 wheeled) Gait Pattern/deviations: Step-through pattern;Trunk flexed;Decreased stride length     General Gait Details: pt pulls on RW, cues to  power up with LEs. light assist to come to full stand. wide BOS   Stairs             Wheelchair Mobility    Modified Rankin (Stroke Patients Only)       Balance   Sitting-balance support: Feet supported Sitting balance-Leahy Scale: Fair     Standing balance support: Bilateral upper extremity supported;During functional activity Standing balance-Leahy Scale: Poor Standing balance comment: reliant on UE support                            Cognition Arousal/Alertness: Awake/alert Behavior During Therapy: WFL for tasks assessed/performed Overall Cognitive Status: Within Functional Limits for tasks assessed                                 General Comments: sleepy, arouses with multi -modal stimuli, states "I'm asleep"      Exercises General Exercises - Lower Extremity Hip Flexion/Marching: AROM;Both;10 reps;Standing    General Comments        Pertinent Vitals/Pain Pain Assessment: No/denies pain    Home Living                      Prior Function            PT Goals (current goals can now be found in the care plan section) Acute Rehab PT Goals Patient Stated Goal: ready to get out of bed, go home PT Goal Formulation: With patient Time For Goal Achievement: 01/21/21 Potential  to Achieve Goals: Good Progress towards PT goals: Progressing toward goals    Frequency    Min 2X/week      PT Plan Current plan remains appropriate    Co-evaluation              AM-PAC PT "6 Clicks" Mobility   Outcome Measure  Help needed turning from your back to your side while in a flat bed without using bedrails?: A Little Help needed moving from lying on your back to sitting on the side of a flat bed without using bedrails?: A Lot Help needed moving to and from a bed to a chair (including a wheelchair)?: A Little Help needed standing up from a chair using your arms (e.g., wheelchair or bedside chair)?: A Little Help needed to  walk in hospital room?: A Little Help needed climbing 3-5 steps with a railing? : A Lot 6 Click Score: 16    End of Session Equipment Utilized During Treatment: Gait belt Activity Tolerance: Patient tolerated treatment well;Patient limited by fatigue Patient left: in chair;with call bell/phone within reach;with chair alarm set;with family/visitor present Nurse Communication: Mobility status PT Visit Diagnosis: Unsteadiness on feet (R26.81);Difficulty in walking, not elsewhere classified (R26.2)     Time: 4035-2481 PT Time Calculation (min) (ACUTE ONLY): 18 min  Charges:  $Therapeutic Activity: 8-22 mins                     Baxter Flattery, PT  Acute Rehab Dept [facility]) [phone] Pager [phone]  01/17/2021    [facility] 01/17/2021, 12:55 PM

## 2021-01-17 NOTE — Progress Notes (Addendum)
   01/17/21 0400  Provider Notification  Provider Name/Title Jeannette Corpus  Date Provider Notified 01/17/21  Time Provider Notified [phone]  Notification Type Page  Notification Reason Other (Comment) (Patient became combative with iV team, after IV team left pt began throwing objects, knocked everything off of bedside table, swinging tele box trying to hit staff, kicking at staff. All of this was happening despite de-esculation techniques.)  Provider response See new orders  Date of Provider Response 01/17/21  Time of Provider Response [phone]   Patient became combative and agitated with IV team. Behavior continued when IV team left, I offered the patient food, drink, ans I asked the patient what I could do to help. Patient continued to hit the bedside table, patient took the tele monitor off and began to use it to knock items off of the bedside table, tried to hit staff with telemetry box, when staff approached patient tried to hit and kick staff members. I paged the on-call provider, new orders were placed. Order of Haldol was placed, I informed provider that staff could not approach patient safely to administer medication. Provider put in an order for soft restraints. I informed the charge nurse. Soft restraints were implemented, patient fell asleep once restraints were in place. I attempted to notify patient's wife Wilburn Cornelia, there was no answer. Patient is in no distress, will continue to monitor.

## 2021-01-17 NOTE — Progress Notes (Signed)
PROGRESS NOTE    Leon Estes  KGY:[id number] DOB: [birth year] DOA: 12/27/2020 PCP: Venia Carbon, MD    Brief Narrative:  Leon Estes was admitted to the hospitalwith theworking diagnosis of severe sepsis due to [name], on chronic cholecystitis. Prolonged hospitalization complicated with delirium, atrial fibrillation and persistent hypokalemia due to large GI losses.   87 year old male past medical history for coronary artery disease, COPD, chronic kidney disease, hypertension, dyslipidemia and BPH who presented with fevers and weakness. Recent hospitalization for cholecystitis, medically treated 4/16-4/19/2022. At home patient had recurrent fevers and diaphoresis that prompted him to come back to the hospital. On his initial physical examination his blood pressure was 142/88, heart rate 96, respiratory rate 16, temperature 103.1 F rectally oxygenation 90% on room air. His lungs are clear to auscultation bilaterally, heart S1-S2, present, rhythmic, abdomen soft nontender, no lower extremity edema.  Sodium 139, potassium 4.1, chloride 106, bicarb 24, glucose 133, BUN 22, creatinine 1.48, lactic acid 3.2,white count 15.3, hemoglobin 12.9, hematocrit 42.1, platelets 238. SARS COVID-19 negative.  Urinalysis specific gravity 1.039, 0-5 white cells>300 protein.  Stool positive for C. Difficile.  CT of the abdomen and pelvis with prostatomegaly, stable appearance of gallbladder with mild, stable central intrahepatic biliary dilatation.  Patient has developed colonic dilatation, concerned for early toxic megacolon.Placed onoral and rectal vancomycin plus IV metronidazole. Surgery has recommended medical therapy.  Patient very weak and deconditioned, poor oral intake, currently on TPN for supportive nutrition.  06/01Colitis slowly improving, transitioned to oral vancomycin and discontinue vancomycin enemas.  Persistent hyperkalemia due tosignificantGI  losses  Plan to dc home with home health services when diarrhea improves and serum K more stable above 3,5  Assessment & Plan:   Principal Problem:   Colitis due to Clostridioides difficile Active Problems:   CAD (coronary artery disease)   Benign essential HTN   COPD (chronic obstructive pulmonary disease) (HCC)   Chronic renal disease, stage III (HCC)   BPH (benign prostatic hyperplasia)   Severe sepsis (HCC)   HOH (hard of hearing)   Acute on chronic cholecystitis   Hypokalemia   Acute delirium   Palliative care by specialist   Goals of care, counseling/discussion   General weakness   Toxic megacolon ([place])    1.Severe sepsis due to [name](end-organ damage AKI and metabolic encephalopathy)toxic megacolon. Today his abdomen is more distended but continue to be soft, continue to have watery diarrhea and has been leaking around the rectal tube.  Continue with oral vancomycin, will likely need a prolonged therapy, until clinical improvement, continue with IV metronidazole while hospitalized.  His po intake has been poor, continue to encourage nutrition. His TPN has been discontinued.   2. AKI with Hypokalemia/ hypernatremia/ hypomagnesemia/ non anion gap metabolic acidosis. K today up to 3.5 with serum cr 0,87 and bicarbonate at 23 and Na 142. Mg is 2,0  Continue with K correction, add 80 meq today in 2 divided doses and follow up renal panel in am.   3. Acute metabolic encephalopathy, delirium Ondysphagia 3diet and aspiration precautions. Patient has been more confused and agitated at night, last night he required physical restrains.  Add trazodone tonight and continue close neuro checks, continue restrains if needed.   4. HTN/ dyslipidemiaOff antihypertensive medications, continue with statin therapy.   5, COPDNoclinical signs of acute exacerbation.Continue withBreo Ellipta  6. Paroxysmal atrial fibrillation.No anticoagulation  due to risk of bleeding and fall risk.  On metoprolol 50 mg po bid, for rate  control with good toleration. Plan to start patient on anticoagulation when patient recovers from C diff.   Target K at 4 and Mg at 2.    7. T2DM/ dyslipidemia.Today his fasting glucose is 100. On sliding scale for glucose cover and monitoring   Continue withsimvastatin.    Patient continue to be at high risk for worsening c diff, encephalopathy and hypokalemia   Status is: Inpatient  Remains inpatient appropriate because:IV treatments appropriate due to intensity of illness or inability to take PO   Dispo:  Patient From: Home  Planned Disposition: Home with Health Care Svc  Medically stable for discharge: No    DVT prophylaxis: Enoxaparin   Code Status:   DNR   Family Communication:  No family at the bedside      Nutrition Status: Nutrition Problem: Increased nutrient needs Etiology: acute illness,post-op healing Signs/Symptoms: estimated needs Interventions: Boost Breeze,Ensure Enlive (each supplement provides 350kcal and 20 grams of protein),TPN    Consultants:   Surgery    Antimicrobials:   Oral vancomycin and IV metronidazole     Subjective: Patient has been confused in the evening, no nausea or vomiting, continue to have watery diarrhea, chest pain or dyspnea.   Objective: Vitals:   01/17/21 0349 01/17/21 0356 01/17/21 0804 01/17/21 1038  BP:  (!) 152/76  (!) 152/88  Pulse:  (!) 53  96  Resp:  20  16  Temp:  98.2 F (36.8 C)  98.4 F (36.9 C)  TempSrc:  Oral  Oral  SpO2:  98% 97% 96%  Weight: 71.9 kg     Height:        Intake/Output Summary (Last 24 hours) at 01/17/2021 1607 Last data filed at 01/17/2021 1300 Gross per 24 hour  Intake 370 ml  Output 825 ml  Net -455 ml   Filed Weights   01/15/21 0601 01/16/21 0500 01/17/21 0349  Weight: 73.1 kg 70.8 kg 71.9 kg    Examination:   General: Not in pain or dyspnea, deconditioned and ill looking appearing   Neurology: Awake and alert, non focal  E ENT: positive pallor, no icterus, oral mucosa moist Cardiovascular: No JVD. S1-S2 present, rhythmic, no gallops, rubs, or murmurs. Trace lower extremity edema. Pulmonary: positive breath sounds bilaterally, adequate air movement, no wheezing, rhonchi or rales. Gastrointestinal. Abdomen mild distended, non tender, no rebound or guarding  Skin. No rashes Musculoskeletal: no joint deformities     Data Reviewed: I have personally reviewed following labs and imaging studies  CBC: Recent Labs  Lab 01/11/21 0253 01/12/21 0416  WBC 6.9 8.1  NEUTROABS 4.8 5.9  HGB 9.0* 9.1*  HCT 28.1* 28.0*  MCV 88.6 88.3  PLT 175 338   Basic Metabolic Panel: Recent Labs  Lab 01/11/21 0253 01/11/21 0830 01/11/21 1900 01/12/21 0416 01/13/21 0344 01/13/21 1800 01/14/21 0335 01/14/21 1835 01/15/21 0328 01/16/21 0300 01/17/21 0252  NA 137  --    < > 138 140  --  140  --  139 140 142  K 2.7*  --    < > 3.2* 2.9*   < > 2.9* 3.0* 3.0* 2.7* 3.5  CL 107  --    < > 109 110  --  111  --  111 111 112*  CO2 26  --    < > 22 24  --  24  --  22 22 23   GLUCOSE 202*  --    < > 121* 101*  --  94  --  105*  96 100*  BUN 22  --    < > 18 15  --  14  --  13 11 11   CREATININE 0.85  --    < > 0.75 0.86  --  0.74  --  0.94 0.92 0.87  CALCIUM 7.5*  --    < > 7.7* 7.8*  --  7.7*  --  7.7* 7.7* 8.2*  MG 1.9  --   --  2.0  --   --   --   --  1.5* 1.9 2.0  PHOS  --  2.2*  --  3.4  --   --   --   --   --   --   --    < > = values in this interval not displayed.   GFR: Estimated Creatinine Clearance: 55.9 mL/min (by C-G formula based on SCr of 0.87 mg/dL). Liver Function Tests: Recent Labs  Lab 01/12/21 0416  AST 22  ALT 23  ALKPHOS 36*  BILITOT 0.2*  PROT 4.6*  ALBUMIN 2.4*   No results for input(s): LIPASE, AMYLASE in the last 168 hours. No results for input(s): AMMONIA in the last 168 hours. Coagulation Profile: No results for input(s): INR, PROTIME in the last  168 hours. Cardiac Enzymes: No results for input(s): CKTOTAL, CKMB, CKMBINDEX, TROPONINI in the last 168 hours. BNP (last 3 results) No results for input(s): PROBNP in the last 8760 hours. HbA1C: No results for input(s): HGBA1C in the last 72 hours. CBG: Recent Labs  Lab 01/11/21 1811 01/11/21 2150 01/12/21 0507 01/12/21 1457 01/13/21 1323  GLUCAP 135* 139* 125* 183* 104*   Lipid Profile: No results for input(s): CHOL, HDL, LDLCALC, TRIG, CHOLHDL, LDLDIRECT in the last 72 hours. Thyroid Function Tests: No results for input(s): TSH, T4TOTAL, FREET4, T3FREE, THYROIDAB in the last 72 hours. Anemia Panel: No results for input(s): VITAMINB12, FOLATE, FERRITIN, TIBC, IRON, RETICCTPCT in the last 72 hours.    Radiology Studies: I have reviewed all of the imaging during this hospital visit personally     Scheduled Meds: . aspirin EC  81 mg Oral Daily  . budesonide (PULMICORT) nebulizer solution  0.5 mg Nebulization BID  . Chlorhexidine Gluconate Cloth  6 each Topical Daily  . enoxaparin (LOVENOX) injection  40 mg Subcutaneous Q24H  . feeding supplement  1 Container Oral Q24H  . feeding supplement  237 mL Oral BID BM  . haloperidol lactate  2 mg Intravenous Once  . mouth rinse  15 mL Mouth Rinse BID  . metoprolol tartrate  50 mg Oral BID  . simvastatin  40 mg Oral QPM  . sodium chloride flush  10-40 mL Intracatheter Q12H  . sodium chloride flush  3 mL Intravenous Q12H  . tamsulosin  0.4 mg Oral Daily  . vancomycin  500 mg Oral Q6H   Continuous Infusions: . sodium chloride 250 mL (01/09/21 0751)  . metronidazole 500 mg (01/17/21 1340)     LOS: 21 days        Leon Behney Gerome Apley, MD

## 2021-01-17 NOTE — Progress Notes (Signed)
Patient is asleep. Family members x2 at bedside. When the patients family members arrived education and explanation for restraints were provided. Family members removed pt restraints. Will continue to monitor and maintain safety precautions.

## 2021-01-17 NOTE — Progress Notes (Signed)
OT Cancellation Note  Patient Details Name: KANNON GRANDERSON MRN: [id number] DOB: 27-Mar-1931   Cancelled Treatment:    Reason Eval/Treat Not Completed: Fatigue/lethargy limiting ability to participate. RN consent to OT however upon arrival to patient's room sleeping very soundly, Will re-attempt as schedule permits.  Delbert Phenix OT OT pager: [place] 01/17/2021, 11:15 AM

## 2021-01-17 NOTE — Progress Notes (Signed)
OT Cancellation Note  Patient Details Name: LIBORIO SACCENTE MRN: [id number] DOB: 29-Nov-1930   Cancelled Treatment:    Reason Eval/Treat Not Completed: Other (comment) Upon arrival patient working with PT, will re-attempt as schedule permits.   Delbert Phenix OT OT pager: [place] 01/17/2021, 1:50 PM

## 2021-01-17 NOTE — Progress Notes (Signed)
   01/17/21 0100  Provider Notification  Provider Name/Title Jeannette Corpus  Date Provider Notified 01/17/21  Time Provider Notified [phone]  Notification Type Page  Notification Reason Other (Comment) (patient increasingingly agitated, confused, anxious, restless, and not maintaining patient safety.)  Provider response See new orders  Date of Provider Response 01/17/21  Time of Provider Response [phone]

## 2021-01-18 LAB — CBC WITH DIFFERENTIAL/PLATELET
Abs Immature Granulocytes: 0.03 10*3/uL (ref 0.00–0.07)
Basophils Absolute: 0 10*3/uL (ref 0.0–0.1)
Basophils Relative: 1 %
Eosinophils Absolute: 0.1 10*3/uL (ref 0.0–0.5)
Eosinophils Relative: 2 %
HCT: 29.7 % — ABNORMAL LOW (ref 39.0–52.0)
Hemoglobin: 9.2 g/dL — ABNORMAL LOW (ref 13.0–17.0)
Immature Granulocytes: 1 %
Lymphocytes Relative: 19 %
Lymphs Abs: 1.2 10*3/uL (ref 0.7–4.0)
MCH: 29.1 pg (ref 26.0–34.0)
MCHC: 31 g/dL (ref 30.0–36.0)
MCV: 94 fL (ref 80.0–100.0)
Monocytes Absolute: 0.6 10*3/uL (ref 0.1–1.0)
Monocytes Relative: 10 %
Neutro Abs: 4.3 10*3/uL (ref 1.7–7.7)
Neutrophils Relative %: 67 %
Platelets: 246 10*3/uL (ref 150–400)
RBC: 3.16 MIL/uL — ABNORMAL LOW (ref 4.22–5.81)
RDW: 18 % — ABNORMAL HIGH (ref 11.5–15.5)
WBC: 6.3 10*3/uL (ref 4.0–10.5)
nRBC: 0 % (ref 0.0–0.2)

## 2021-01-18 LAB — BASIC METABOLIC PANEL
Anion gap: 8 (ref 5–15)
BUN: 9 mg/dL (ref 8–23)
CO2: 21 mmol/L — ABNORMAL LOW (ref 22–32)
Calcium: 8.2 mg/dL — ABNORMAL LOW (ref 8.9–10.3)
Chloride: 115 mmol/L — ABNORMAL HIGH (ref 98–111)
Creatinine, Ser: 0.92 mg/dL (ref 0.61–1.24)
GFR, Estimated: >60 mL/min (ref >60)
Glucose, Bld: 90 mg/dL (ref 70–99)
Potassium: 2.9 mmol/L — ABNORMAL LOW (ref 3.5–5.1)
Sodium: 144 mmol/L (ref 135–145)

## 2021-01-18 MED ORDER — POTASSIUM CHLORIDE CRYS ER 20 MEQ PO TBCR
40.0000 meq | EXTENDED_RELEASE_TABLET | ORAL | Status: AC
  Administered 2021-01-18 (×2): 40 meq via ORAL
  Filled 2021-01-18 (×2): qty 2

## 2021-01-18 MED ORDER — POTASSIUM CL IN DEXTROSE 5% 20 MEQ/L IV SOLN
20.0000 meq | INTRAVENOUS | Status: DC
  Administered 2021-01-18: 20 meq via INTRAVENOUS
  Filled 2021-01-18 (×2): qty 1000

## 2021-01-18 MED ORDER — ADULT MULTIVITAMIN W/MINERALS CH
1.0000 | ORAL_TABLET | Freq: Every day | ORAL | Status: DC
  Administered 2021-01-18 – 2021-01-23 (×6): 1 via ORAL
  Filled 2021-01-18 (×6): qty 1

## 2021-01-18 NOTE — Progress Notes (Addendum)
Nutrition Follow-up  DOCUMENTATION CODES:   Not applicable  INTERVENTION:  - continue Boost Breeze once/day and Ensure Enlive BID.  - will order 1 tablet multivitamin with minerals/day.    NUTRITION DIAGNOSIS:   Increased nutrient needs related to acute illness,post-op healing as evidenced by estimated needs. -ongoing  GOAL:   Patient will meet greater than or equal to 90% of their needs -minimally met on average  MONITOR:   PO intake,Supplement acceptance,Labs,Weight trends,I & O's  ASSESSMENT:   87 year old male with medical history of CAD s/p stenting in 1997, COPD, stage 3 CKD, HTN, HLD, and BPH. He was admitted to [facility] 4/16-4/19 for cholecystitis, cystic duct obstruction with plan for lap chole on 5/31. He presented to the ED on 5/17 with fever and generalized weakness. CT abdomen/pelvis showed prostatomegaly.  Patient continues to be a/o to self only. TPN stopped on 6/2. He has been on Dysphagia 3, thin liquids since that date. Most recently documented meal intakes:  6/5- 15% of lunch and 20% of dinner (total of 223 kcal and 12 grams protein 6/6- 0% of breakfast and 20% of dinner (143 kcal and 7 grams protein) 6/7- 20% of breakfast, 25% of lunch, and 0% of dinner (total of 309 kcal and 14 grams protein) 6/8- 30% of breakfast (244 kcal and 8 grams protein)   He is ordered Boost Breeze once/day and Ensure Enlive BID and has been accepting both of these supplements ~90% of the time offered. If he is consuming 100% of these supplements the times he accepts them, he is consuming a daily average of 855 kcal and 44 grams protein from oral nutrition supplements.   Weight has been trending down and today's weight is the lowest weight this hospitalization. Moderate pitting edema to BLE documented in the edema section of flow sheet.   Patient is from home and plan is for Home with [place].     Labs reviewed; K: 2.9 mmol/l, Cl: 115 mmol/l, Ca: 8.2 mg/dl. Medications  reviewed; 40 mEq Klor-Con x2 doses 6/7   Diet Order:   Diet Order            DIET DYS 3 Room service appropriate? Yes; Fluid consistency: Thin  Diet effective now                 EDUCATION NEEDS:   No education needs have been identified at this time  Skin:  Skin Assessment: Reviewed RN Assessment  Last BM:  documentation indicates 1000 ml output today  Height:   Ht Readings from Last 1 Encounters:  12/27/20 5' 7"  (1.702 m)    Weight:   Wt Readings from Last 1 Encounters:  01/18/21 69.7 kg     Estimated Nutritional Needs:  Kcal:  1600-1800 kcal Protein:  70-85 grams Fluid:  >/= 1.8 L/day      Jarome Matin, MS, RD, LDN, CNSC Inpatient Clinical Dietitian RD pager # available in [facility]  After hours/weekend pager # available in [facility]

## 2021-01-18 NOTE — Progress Notes (Signed)
Occupational Therapy Treatment Patient Details Name: Leon Estes MRN: [id number] DOB: 04-Dec-1930 Today's Date: 01/18/2021    History of present illness 87 year old male with history of CAD status post stenting to RCA in 1997, COPD, CKD stage III, hypertension, hyperlipidemia, BPH, recent admission at [facility] from 11/26/2020-11/29/2020 with presumed acute cholecystitis ,presented on 12/27/2020 with fever and generalized weakness.Hospital course has been complicated by delirium/agitation along with A. fib with RVR, C Diff., abdominal distention with imaging showing concern for toxic megacolon   OT comments  Patient is slowly making progress - needing less assistance for bed mobility and able to ambulate 10 feet to the door. In regards to ADLs he still needs assistance and is currently total assist for toileting due to rectal tube. Per chart plan of care was for patient to discharge home - however patient does not demonstrate the physical abilities to do so. Family in room and discussed potential need for rehab if patient doesn't progress well while in hospital. Therapist recommends short term rehab at this time. Will continue to follow acutely.     Follow Up Recommendations  SNF    Equipment Recommendations  3 in 1 bedside commode    Recommendations for Other Services      Precautions / Restrictions Precautions Precautions: Fall Precaution Comments: wife reports 2-3 recent falls Restrictions Weight Bearing Restrictions: No       Mobility Bed Mobility Overal bed mobility: Needs Assistance Bed Mobility: Supine to Sit     Supine to sit: Min assist     General bed mobility comments: Increased time and min assist for trunk to transfer to side of bed. Patient used bed rail.    Transfers Overall transfer level: Needs assistance Equipment used: Rolling walker (2 wheeled) Transfers: Sit to/from Stand Sit to Stand: Min assist         General transfer comment: MIn assist to ambulate  10 feet to the door. Min assist for steadying and walker management.    Balance Overall balance assessment: Needs assistance Sitting-balance support: No upper extremity supported Sitting balance-Leahy Scale: Fair     Standing balance support: Bilateral upper extremity supported;During functional activity Standing balance-Leahy Scale: Poor Standing balance comment: reliant on UE support                           ADL either performed or assessed with clinical judgement   ADL Overall ADL's : Needs assistance/impaired     Grooming: Wash/dry face;Set up;Sitting Grooming Details (indicate cue type and reason): washed face sitting up in recliner                                     Vision   Vision Assessment?: No apparent visual deficits   Perception     Praxis      Cognition Arousal/Alertness: Awake/alert Behavior During Therapy: WFL for tasks assessed/performed Overall Cognitive Status: Impaired/Different from baseline                                 General Comments: originally sleepy but awakens with voice. Alertness improved with activity. Patient alert to self only.        Exercises     Shoulder Instructions       General Comments      Pertinent Vitals/ Pain  Pain Assessment: Faces Pain Score: 0-No pain Faces Pain Scale: No hurt  Home Living                                          Prior Functioning/Environment              Frequency  Min 2X/week        Progress Toward Goals  OT Goals(current goals can now be found in the care plan section)  Progress towards OT goals: Progressing toward goals  Acute Rehab OT Goals Patient Stated Goal: to get stronger in order to go home OT Goal Formulation: With family Potential to Achieve Goals: [place] Discharge plan remains appropriate    Co-evaluation                 AM-PAC OT "6 Clicks" Daily Activity     Outcome Measure    Help from another person eating meals?: A Little Help from another person taking care of personal grooming?: A Little Help from another person toileting, which includes using toliet, bedpan, or urinal?: Total Help from another person bathing (including washing, rinsing, drying)?: A Lot Help from another person to put on and taking off regular upper body clothing?: A Lot Help from another person to put on and taking off regular lower body clothing?: A Lot 6 Click Score: 13    End of Session Equipment Utilized During Treatment: Gait belt;Rolling walker  OT Visit Diagnosis: Unsteadiness on feet (R26.81);Other abnormalities of gait and mobility (R26.89);Muscle weakness (generalized) (M62.81);Other symptoms and signs involving cognitive function   Activity Tolerance Patient tolerated treatment well   Patient Left in chair;with call bell/phone within reach;with chair alarm set   Nurse Communication Mobility status        Time: 1359-1416 OT Time Calculation (min): 17 min  Charges: OT General Charges $OT Visit: 1 Visit OT Treatments $Therapeutic Activity: 8-22 mins  Derl Barrow, OTR/L [place]  Office [phone] Pager: [place] 01/18/2021, 3:16 PM

## 2021-01-18 NOTE — Plan of Care (Signed)
  Problem: Clinical Measurements: Goal: Ability to maintain clinical measurements within normal limits will improve Outcome: Progressing Goal: Respiratory complications will improve Outcome: Progressing   Problem: Nutrition: Goal: Adequate nutrition will be maintained Outcome: Not Progressing   Problem: Elimination: Goal: Will not experience complications related to bowel motility Outcome: Not Progressing   Problem: Safety: Goal: Non-violent Restraint(s) Outcome: Not Applicable

## 2021-01-18 NOTE — Progress Notes (Signed)
PROGRESS NOTE    Leon Estes  CBS:[id number] DOB: [birth year] DOA: 12/27/2020 PCP: Venia Carbon, MD    Brief Narrative:  Leon Estes was admitted to the hospitalwith theworking diagnosis of severe sepsis due to [name], on chronic cholecystitis. Prolonged hospitalization complicated with delirium, atrial fibrillation and persistent hypokalemia due to large GI losses.   87 year old male past medical history for coronary artery disease, COPD, chronic kidney disease, hypertension, dyslipidemia and BPH who presented with fevers and weakness. Recent hospitalization for cholecystitis, medically treated 4/16-4/19/2022. At home patient had recurrent fevers and diaphoresis that prompted him to come back to the hospital. On his initial physical examination his blood pressure was 142/88, heart rate 96, respiratory rate 16, temperature 103.1 F rectally oxygenation 90% on room air. His lungs are clear to auscultation bilaterally, heart S1-S2, present, rhythmic, abdomen soft nontender, no lower extremity edema.  Sodium 139, potassium 4.1, chloride 106, bicarb 24, glucose 133, BUN 22, creatinine 1.48, lactic acid 3.2,white count 15.3, hemoglobin 12.9, hematocrit 42.1, platelets 238. SARS COVID-19 negative.  Urinalysis specific gravity 1.039, 0-5 white cells>300 protein.  Stool positive for C. Difficile.  CT of the abdomen and pelvis with prostatomegaly, stable appearance of gallbladder with mild, stable central intrahepatic biliary dilatation.  Patient has developed colonic dilatation, concerned for early toxic megacolon.Placed onoral and rectal vancomycin plus IV metronidazole. Surgery has recommended medical therapy.  Patient very weak and deconditioned, poor oral intake, currently on TPN for supportive nutrition.  06/01Colitis slowly improving, transitioned to oral vancomycin and discontinue vancomycin enemas.  Persistent hyperkalemia due tosignificantGI  losses  Plan to dc home with home health services when diarrhea improves and serum K more stable above 3,5  Assessment & Plan:   Principal Problem:   Colitis due to Clostridioides difficile Active Problems:   CAD (coronary artery disease)   Benign essential HTN   COPD (chronic obstructive pulmonary disease) (HCC)   Chronic renal disease, stage III (HCC)   BPH (benign prostatic hyperplasia)   Severe sepsis (HCC)   HOH (hard of hearing)   Acute on chronic cholecystitis   Hypokalemia   Acute delirium   Palliative care by specialist   Goals of care, counseling/discussion   General weakness   Toxic megacolon ([place])    1.Severe sepsis due to [name](end-organ damage AKI and metabolic encephalopathy)toxic megacolon. Feels that he is clinically improving with less distention of abdomen and increased appetite.  Continues to require rectal tube, continue to follow output.  Continue with oral vancomycin, will likely need a prolonged therapy, until clinical improvement, continue with IV metronidazole while hospitalized.  Will request infectious disease input to help determine course of antibiotic therapy   2. AKI with Hypokalemia/ hypernatremia/ hypomagnesemia/ non anion gap metabolic acidosis. -Creatinine 1.4 on admission  -This is improved to 0.9 with IV fluids -Potassium remains low due to GI losses, continue replacement -Magnesium normal range  Continue with K correction, add 80 meq today in 2 divided doses and follow up renal panel in am.   3. Acute metabolic encephalopathy, delirium Ondysphagia 3diet and aspiration precautions. Patient has been more confused and agitated at night, last night he required physical restrains.  Add trazodone tonight and continue close neuro checks, continue restrains if needed.   4. HTN/ dyslipidemiaOff antihypertensive medications, continue with statin therapy.   5, COPDNoclinical signs of acute  exacerbation.Continue withBreo Ellipta  6. Paroxysmal atrial fibrillation.No anticoagulation due to risk of bleeding and fall risk.  On metoprolol 50 mg po  bid, for rate control with good toleration. Plan to start patient on anticoagulation when patient recovers from C diff.   Target K at 4 and Mg at 2.    7. T2DM/ dyslipidemia.Today his fasting glucose is 100. On sliding scale for glucose cover and monitoring  Continue withsimvastatin.  8.  Generalized weakness. -Seen by PT and OT with recommendations for skilled nursing facility -We will discussed with patient and family   Patient continue to be at high risk for worsening c diff, encephalopathy and hypokalemia   Status is: Inpatient  Remains inpatient appropriate because:IV treatments appropriate due to intensity of illness or inability to take PO   Dispo:  Patient From: Home  Planned Disposition: Home with Health Care Svc  Medically stable for discharge: No    DVT prophylaxis: Enoxaparin   Code Status:   DNR   Family Communication:  No family at the bedside      Nutrition Status: Nutrition Problem: Increased nutrient needs Etiology: acute illness,post-op healing Signs/Symptoms: estimated needs Interventions: Boost Melene Muller (each supplement provides 350kcal and 20 grams of protein),MVI    Consultants:   Surgery   Cardiology  Palliative care   Antimicrobials:   Oral vancomycin 125 mg 5/18>5/20  Oral vancomycin 500 mg 5/20>  IV Flagyl 5/18 >   Subjective: He feels that his stomach is feeling better today.  Feels that it is less distended.  Feels that his appetite is improving  Objective: Vitals:   01/18/21 0600 01/18/21 0825 01/18/21 1025 01/18/21 1357  BP: (!) 150/90  118/76 (!) 143/75  Pulse: 91  61 66  Resp: 20     Temp: 98.5 F (36.9 C)   97.9 F (36.6 C)  TempSrc: Oral   Oral  SpO2: 97% 97%  99%  Weight:      Height:        Intake/Output Summary (Last 24 hours)  at 01/18/2021 1807 Last data filed at 01/18/2021 1300 Gross per 24 hour  Intake 625.07 ml  Output 1700 ml  Net -1074.93 ml   Filed Weights   01/16/21 0500 01/17/21 0349 01/18/21 0500  Weight: 70.8 kg 71.9 kg 69.7 kg    Examination:   General exam: Alert, awake, oriented x 3 Respiratory system: Clear to auscultation. Respiratory effort normal. Cardiovascular system:RRR. No murmurs, rubs, gallops. Gastrointestinal system: Abdomen is nondistended, soft and nontender. No organomegaly or masses felt. Normal bowel sounds heard. Rectal tube in place with approximately 100cc of brown liquid stool in bag Central nervous system: Alert and oriented. No focal neurological deficits. Extremities: 1+ edema bilaterally Skin: No rashes, lesions or ulcers Psychiatry: Judgement and insight appear normal. Mood & affect appropriate.    Data Reviewed: I have personally reviewed following labs and imaging studies  CBC: Recent Labs  Lab 01/12/21 0416 01/18/21 0424  WBC 8.1 6.3  NEUTROABS 5.9 4.3  HGB 9.1* 9.2*  HCT 28.0* 29.7*  MCV 88.3 94.0  PLT 207 774   Basic Metabolic Panel: Recent Labs  Lab 01/12/21 0416 01/13/21 0344 01/14/21 0335 01/14/21 1835 01/15/21 0328 01/16/21 0300 01/17/21 0252 01/18/21 0424  NA 138   < > 140  --  139 140 142 144  K 3.2*   < > 2.9* 3.0* 3.0* 2.7* 3.5 2.9*  CL 109   < > 111  --  111 111 112* 115*  CO2 22   < > 24  --  22 22 23  21*  GLUCOSE 121*   < > 94  --  105* 96 100* 90  BUN 18   < > 14  --  13 11 11 9   CREATININE 0.75   < > 0.74  --  0.94 0.92 0.87 0.92  CALCIUM 7.7*   < > 7.7*  --  7.7* 7.7* 8.2* 8.2*  MG 2.0  --   --   --  1.5* 1.9 2.0  --   PHOS 3.4  --   --   --   --   --   --   --    < > = values in this interval not displayed.   GFR: Estimated Creatinine Clearance: 52.9 mL/min (by C-G formula based on SCr of 0.92 mg/dL). Liver Function Tests: Recent Labs  Lab 01/12/21 0416  AST 22  ALT 23  ALKPHOS 36*  BILITOT 0.2*  PROT 4.6*   ALBUMIN 2.4*   No results for input(s): LIPASE, AMYLASE in the last 168 hours. No results for input(s): AMMONIA in the last 168 hours. Coagulation Profile: No results for input(s): INR, PROTIME in the last 168 hours. Cardiac Enzymes: No results for input(s): CKTOTAL, CKMB, CKMBINDEX, TROPONINI in the last 168 hours. BNP (last 3 results) No results for input(s): PROBNP in the last 8760 hours. HbA1C: No results for input(s): HGBA1C in the last 72 hours. CBG: Recent Labs  Lab 01/11/21 1811 01/11/21 2150 01/12/21 0507 01/12/21 1457 01/13/21 1323  GLUCAP 135* 139* 125* 183* 104*   Lipid Profile: No results for input(s): CHOL, HDL, LDLCALC, TRIG, CHOLHDL, LDLDIRECT in the last 72 hours. Thyroid Function Tests: No results for input(s): TSH, T4TOTAL, FREET4, T3FREE, THYROIDAB in the last 72 hours. Anemia Panel: No results for input(s): VITAMINB12, FOLATE, FERRITIN, TIBC, IRON, RETICCTPCT in the last 72 hours.    Radiology Studies: I have reviewed all of the imaging during this hospital visit personally     Scheduled Meds: . aspirin EC  81 mg Oral Daily  . budesonide (PULMICORT) nebulizer solution  0.5 mg Nebulization BID  . Chlorhexidine Gluconate Cloth  6 each Topical Daily  . enoxaparin (LOVENOX) injection  40 mg Subcutaneous Q24H  . feeding supplement  1 Container Oral Q24H  . feeding supplement  237 mL Oral BID BM  . haloperidol lactate  2 mg Intravenous Once  . mouth rinse  15 mL Mouth Rinse BID  . metoprolol tartrate  50 mg Oral BID  . multivitamin with minerals  1 tablet Oral Daily  . potassium chloride  40 mEq Oral Q4H  . simvastatin  40 mg Oral QPM  . sodium chloride flush  10-40 mL Intracatheter Q12H  . sodium chloride flush  3 mL Intravenous Q12H  . tamsulosin  0.4 mg Oral Daily  . traZODone  50 mg Oral QHS  . vancomycin  500 mg Oral Q6H   Continuous Infusions: . sodium chloride 10 mL/hr at 01/18/21 0600  . dextrose 5 % with KCl 20 mEq / L    .  metronidazole 500 mg (01/18/21 1319)     LOS: 22 days        Kathie Dike, MD

## 2021-01-19 LAB — CBC
HCT: 28.1 % — ABNORMAL LOW (ref 39.0–52.0)
Hemoglobin: 8.7 g/dL — ABNORMAL LOW (ref 13.0–17.0)
MCH: 28.4 pg (ref 26.0–34.0)
MCHC: 31 g/dL (ref 30.0–36.0)
MCV: 91.8 fL (ref 80.0–100.0)
Platelets: 219 10*3/uL (ref 150–400)
RBC: 3.06 MIL/uL — ABNORMAL LOW (ref 4.22–5.81)
RDW: 18 % — ABNORMAL HIGH (ref 11.5–15.5)
WBC: 5.2 10*3/uL (ref 4.0–10.5)
nRBC: 0 % (ref 0.0–0.2)

## 2021-01-19 LAB — BASIC METABOLIC PANEL
Anion gap: 4 — ABNORMAL LOW (ref 5–15)
BUN: 7 mg/dL — ABNORMAL LOW (ref 8–23)
CO2: 23 mmol/L (ref 22–32)
Calcium: 7.8 mg/dL — ABNORMAL LOW (ref 8.9–10.3)
Chloride: 115 mmol/L — ABNORMAL HIGH (ref 98–111)
Creatinine, Ser: 0.9 mg/dL (ref 0.61–1.24)
GFR, Estimated: >60 mL/min (ref >60)
Glucose, Bld: 145 mg/dL — ABNORMAL HIGH (ref 70–99)
Potassium: 2.9 mmol/L — ABNORMAL LOW (ref 3.5–5.1)
Sodium: 142 mmol/L (ref 135–145)

## 2021-01-19 LAB — GLUCOSE, CAPILLARY: Glucose-Capillary: 96 mg/dL (ref 70–99)

## 2021-01-19 MED ORDER — POTASSIUM CHLORIDE CRYS ER 20 MEQ PO TBCR
40.0000 meq | EXTENDED_RELEASE_TABLET | ORAL | Status: AC
  Administered 2021-01-19 (×2): 40 meq via ORAL
  Filled 2021-01-19 (×2): qty 2

## 2021-01-19 MED ORDER — POTASSIUM CHLORIDE 10 MEQ/100ML IV SOLN
10.0000 meq | INTRAVENOUS | Status: AC
  Administered 2021-01-19 (×4): 10 meq via INTRAVENOUS
  Filled 2021-01-19 (×4): qty 100

## 2021-01-19 MED ORDER — VANCOMYCIN HCL 125 MG PO CAPS
125.0000 mg | ORAL_CAPSULE | Freq: Four times a day (QID) | ORAL | Status: DC
  Administered 2021-01-19 – 2021-01-21 (×7): 125 mg via ORAL
  Filled 2021-01-19 (×9): qty 1

## 2021-01-19 NOTE — Plan of Care (Signed)
  Problem: Education: Goal: Knowledge of General Education information will improve Description: Including pain rating scale, medication(s)/side effects and non-pharmacologic comfort measures Outcome: Progressing   Problem: Clinical Measurements: Goal: Will remain free from infection Outcome: Progressing   Problem: Nutrition: Goal: Adequate nutrition will be maintained Outcome: Progressing   Problem: Elimination: Goal: Will not experience complications related to bowel motility Outcome: Progressing   

## 2021-01-19 NOTE — Progress Notes (Signed)
PROGRESS NOTE    Leon Estes  FVC:[id number] DOB: 03-Sep-1930 DOA: 12/27/2020 PCP: Venia Carbon, MD    Brief Narrative:  Leon Estes was admitted to the hospital with the working diagnosis of severe sepsis due to [name], on chronic cholecystitis. Prolonged hospitalization complicated with delirium, atrial fibrillation and persistent hypokalemia due to large GI losses.     87 year old male past medical history for coronary artery disease, COPD, chronic kidney disease, hypertension, dyslipidemia and BPH who presented with fevers and weakness.  Recent hospitalization for cholecystitis, medically treated 4/16-4/19/2022.  At home patient had recurrent fevers and diaphoresis that prompted him to come back to the hospital.  On his initial physical examination his blood pressure was 142/88, heart rate 96, respiratory rate 16, temperature 103.1 F rectally oxygenation 90% on room air.  His lungs are clear to auscultation bilaterally, heart S1-S2, present, rhythmic, abdomen soft nontender, no lower extremity edema.   Sodium 139, potassium 4.1, chloride 106, bicarb 24, glucose 133, BUN 22, creatinine 1.48, lactic acid 3.2, white count 15.3, hemoglobin 12.9, hematocrit 42.1, platelets 238. SARS COVID-19 negative.   Urinalysis specific gravity 1.039, 0-5 white cells > 300 protein.   Stool positive for C. Difficile.   CT of the abdomen and pelvis with prostatomegaly, stable appearance of gallbladder with mild, stable central intrahepatic biliary dilatation.   Patient developed colonic dilatation, concerned for early toxic megacolon. Placed on oral and rectal vancomycin plus IV metronidazole. Surgery has recommended medical therapy.   Patient very weak and deconditioned, poor oral intake, was briefly on TPN for supportive nutrition.    06/01 Colitis slowly improving, transitioned to oral vancomycin and discontinue vancomycin enemas.  Persistent hyperkalemia due to significant GI  losses   Plan to dc home with home health services when diarrhea improves and serum K more stable above 3,5  Assessment & Plan:   Principal Problem:   Colitis due to Clostridioides difficile Active Problems:   CAD (coronary artery disease)   Benign essential HTN   COPD (chronic obstructive pulmonary disease) (HCC)   Chronic renal disease, stage III (HCC)   BPH (benign prostatic hyperplasia)   Severe sepsis (HCC)   HOH (hard of hearing)   Acute on chronic cholecystitis   Hypokalemia   Acute delirium   Palliative care by specialist   Goals of care, counseling/discussion   General weakness   Toxic megacolon ([place])     1.  Severe sepsis due to [name] (end-organ damage AKI and metabolic encephalopathy) toxic megacolon. Feels that he is clinically improving with less distention of abdomen and increased appetite.  Continues to require rectal tube, continue to follow output.   Continue with oral vancomycin, will likely need a prolonged therapy, until clinical improvement, continue with IV metronidazole while hospitalized.  Will request infectious disease input to help determine course of antibiotic therapy    2. AKI with Hypokalemia/ hypernatremia/ hypomagnesemia/ non anion gap metabolic acidosis.  -Creatinine 1.4 on admission  -This is improved to 0.9 with IV fluids -Potassium remains low due to GI losses, continue replacement -Magnesium normal range  Continue with K correction   3. Acute metabolic encephalopathy, delirium  On dysphagia 3 diet and aspiration precautions.  He has been agitated at night and started on trazodone qhs Overall mental status appears to be improving   4. HTN/ dyslipidemia  Off antihypertensive medications, continue with statin therapy.     5, COPD No clinical signs of acute exacerbation. Continue with Adair Patter  6. Paroxysmal atrial fibrillation. No anticoagulation due to risk of bleeding and fall risk.    On metoprolol 50 mg po  bid, for rate control with good toleration. Plan to start patient on anticoagulation when patient recovers from C diff.   Target K at 4 and Mg at 2.     7. T2DM/ dyslipidemia.  Today his fasting glucose is 145. On sliding scale for glucose cover and monitoring   Continue with simvastatin.   8.  Generalized weakness. -Seen by PT and OT with recommendations for skilled nursing facility -Patient's wife is currently wanting to take him home with home health   Patient continue to be at high risk for worsening c diff, encephalopathy and hypokalemia   Status is: Inpatient  Remains inpatient appropriate because:IV treatments appropriate due to intensity of illness or inability to take PO   Dispo:  Patient From: Home  Planned Disposition: Home with Health Care Svc  Medically stable for discharge: No    DVT prophylaxis: Enoxaparin   Code Status:   DNR   Family Communication:  Updated wife    Nutrition Status: Nutrition Problem: Increased nutrient needs Etiology: acute illness, post-op healing Signs/Symptoms: estimated needs Interventions: Boost Breeze, Ensure Enlive (each supplement provides 350kcal and 20 grams of protein), MVI    Consultants:  Surgery  Cardiology Palliative care   Antimicrobials:  Oral vancomycin 125 mg 5/18>5/20 Oral vancomycin 500 mg 5/20> IV Flagyl 5/18 >   Subjective: Sitting up in bed. Feels that his appetite is improving. No abdominal discomfort  Objective: Vitals:   01/19/21 0228 01/19/21 0500 01/19/21 0835 01/19/21 1029  BP:    132/78  Pulse:    81  Resp:      Temp:      TempSrc:      SpO2: 94%  94%   Weight:  71.4 kg    Height:        Intake/Output Summary (Last 24 hours) at 01/19/2021 1117 Last data filed at 01/19/2021 0532 Gross per 24 hour  Intake 1243.45 ml  Output 750 ml  Net 493.45 ml   Filed Weights   01/17/21 0349 01/18/21 0500 01/19/21 0500  Weight: 71.9 kg 69.7 kg 71.4 kg    Examination:   General exam: Alert,  awake, no distress Respiratory system: Clear to auscultation. Respiratory effort normal. Cardiovascular system:RRR. No murmurs, rubs, gallops. Gastrointestinal system: Abdomen is nondistended, soft and nontender. No organomegaly or masses felt. Normal bowel sounds heard. Central nervous system: No focal neurological deficits. Extremities: 1+ edema bilaterally Skin: No rashes, lesions or ulcers Psychiatry: Judgement and insight appear normal. Mood & affect appropriate.     Data Reviewed: I have personally reviewed following labs and imaging studies  CBC: Recent Labs  Lab 01/18/21 0424 01/19/21 0342  WBC 6.3 5.2  NEUTROABS 4.3  --   HGB 9.2* 8.7*  HCT 29.7* 28.1*  MCV 94.0 91.8  PLT 246 829   Basic Metabolic Panel: Recent Labs  Lab 01/15/21 0328 01/16/21 0300 01/17/21 0252 01/18/21 0424 01/19/21 0342  NA 139 140 142 144 142  K 3.0* 2.7* 3.5 2.9* 2.9*  CL 111 111 112* 115* 115*  CO2 22 22 23  21* 23  GLUCOSE 105* 96 100* 90 145*  BUN 13 11 11 9  7*  CREATININE 0.94 0.92 0.87 0.92 0.90  CALCIUM 7.7* 7.7* 8.2* 8.2* 7.8*  MG 1.5* 1.9 2.0  --   --    GFR: Estimated Creatinine Clearance: 54.1 mL/min (by C-G formula based on  SCr of 0.9 mg/dL). Liver Function Tests: No results for input(s): AST, ALT, ALKPHOS, BILITOT, PROT, ALBUMIN in the last 168 hours.  No results for input(s): LIPASE, AMYLASE in the last 168 hours. No results for input(s): AMMONIA in the last 168 hours. Coagulation Profile: No results for input(s): INR, PROTIME in the last 168 hours. Cardiac Enzymes: No results for input(s): CKTOTAL, CKMB, CKMBINDEX, TROPONINI in the last 168 hours. BNP (last 3 results) No results for input(s): PROBNP in the last 8760 hours. HbA1C: No results for input(s): HGBA1C in the last 72 hours. CBG: Recent Labs  Lab 01/12/21 1457 01/13/21 1323  GLUCAP 183* 104*   Lipid Profile: No results for input(s): CHOL, HDL, LDLCALC, TRIG, CHOLHDL, LDLDIRECT in the last 72  hours. Thyroid Function Tests: No results for input(s): TSH, T4TOTAL, FREET4, T3FREE, THYROIDAB in the last 72 hours. Anemia Panel: No results for input(s): VITAMINB12, FOLATE, FERRITIN, TIBC, IRON, RETICCTPCT in the last 72 hours.    Radiology Studies: I have reviewed all of the imaging during this hospital visit personally     Scheduled Meds:  aspirin EC  81 mg Oral Daily   budesonide (PULMICORT) nebulizer solution  0.5 mg Nebulization BID   Chlorhexidine Gluconate Cloth  6 each Topical Daily   enoxaparin (LOVENOX) injection  40 mg Subcutaneous Q24H   feeding supplement  1 Container Oral Q24H   feeding supplement  237 mL Oral BID BM   haloperidol lactate  2 mg Intravenous Once   mouth rinse  15 mL Mouth Rinse BID   metoprolol tartrate  50 mg Oral BID   multivitamin with minerals  1 tablet Oral Daily   simvastatin  40 mg Oral QPM   sodium chloride flush  10-40 mL Intracatheter Q12H   sodium chloride flush  3 mL Intravenous Q12H   tamsulosin  0.4 mg Oral Daily   traZODone  50 mg Oral QHS   vancomycin  500 mg Oral Q6H   Continuous Infusions:  sodium chloride 10 mL/hr at 01/18/21 0600   dextrose 5 % with KCl 20 mEq / L 20 mEq (01/18/21 1910)   metronidazole 500 mg (01/19/21 0532)     LOS: 23 days        Kathie Dike, MD

## 2021-01-20 LAB — RENAL FUNCTION PANEL
Albumin: 2.4 g/dL — ABNORMAL LOW (ref 3.5–5.0)
Anion gap: 3 — ABNORMAL LOW (ref 5–15)
BUN: 7 mg/dL — ABNORMAL LOW (ref 8–23)
CO2: 24 mmol/L (ref 22–32)
Calcium: 8 mg/dL — ABNORMAL LOW (ref 8.9–10.3)
Chloride: 115 mmol/L — ABNORMAL HIGH (ref 98–111)
Creatinine, Ser: 0.93 mg/dL (ref 0.61–1.24)
GFR, Estimated: >60 mL/min (ref >60)
Glucose, Bld: 96 mg/dL (ref 70–99)
Phosphorus: 2.5 mg/dL (ref 2.5–4.6)
Potassium: 3.5 mmol/L (ref 3.5–5.1)
Sodium: 142 mmol/L (ref 135–145)

## 2021-01-20 LAB — CBC
HCT: 29.1 % — ABNORMAL LOW (ref 39.0–52.0)
Hemoglobin: 9.2 g/dL — ABNORMAL LOW (ref 13.0–17.0)
MCH: 29.4 pg (ref 26.0–34.0)
MCHC: 31.6 g/dL (ref 30.0–36.0)
MCV: 93 fL (ref 80.0–100.0)
Platelets: 206 10*3/uL (ref 150–400)
RBC: 3.13 MIL/uL — ABNORMAL LOW (ref 4.22–5.81)
RDW: 18.6 % — ABNORMAL HIGH (ref 11.5–15.5)
WBC: 4.3 10*3/uL (ref 4.0–10.5)
nRBC: 0 % (ref 0.0–0.2)

## 2021-01-20 LAB — MAGNESIUM: Magnesium: 1.5 mg/dL — ABNORMAL LOW (ref 1.7–2.4)

## 2021-01-20 MED ORDER — POTASSIUM CHLORIDE CRYS ER 20 MEQ PO TBCR
40.0000 meq | EXTENDED_RELEASE_TABLET | Freq: Once | ORAL | Status: AC
  Administered 2021-01-20: 40 meq via ORAL
  Filled 2021-01-20: qty 2

## 2021-01-20 MED ORDER — MAGNESIUM SULFATE 4 GM/100ML IV SOLN
4.0000 g | Freq: Once | INTRAVENOUS | Status: AC
  Administered 2021-01-20: 4 g via INTRAVENOUS
  Filled 2021-01-20: qty 100

## 2021-01-20 NOTE — Plan of Care (Signed)
  Problem: Clinical Measurements: Goal: Will remain free from infection Outcome: Progressing Goal: Respiratory complications will improve Outcome: Progressing Goal: Cardiovascular complication will be avoided Outcome: Progressing   Problem: Nutrition: Goal: Adequate nutrition will be maintained Outcome: Progressing

## 2021-01-20 NOTE — Progress Notes (Signed)
PROGRESS NOTE    Leon Estes  YQM:[id number] DOB: 1931/01/31 DOA: 12/27/2020 PCP: Venia Carbon, MD   Brief Narrative:87 year old male past medical history for coronary artery disease, COPD, chronic kidney disease, hypertension, dyslipidemia and BPH who presented with fevers and weakness.  Recent hospitalization for cholecystitis, medically treated 4/16-4/19/2022.  At home patient had recurrent fevers and diaphoresis that prompted him to come back to the hospital.  On his initial physical examination his blood pressure was 142/88, heart rate 96, respiratory rate 16, temperature 103.1 F rectally oxygenation 90% on room air.  His lungs are clear to auscultation bilaterally, heart S1-S2, present, rhythmic, abdomen soft nontender, no lower extremity edema.   Sodium 139, potassium 4.1, chloride 106, bicarb 24, glucose 133, BUN 22, creatinine 1.48, lactic acid 3.2, white count 15.3, hemoglobin 12.9, hematocrit 42.1, platelets 238. SARS COVID-19 negative.   Urinalysis specific gravity 1.039, 0-5 white cells > 300 protein.   Stool positive for C. Difficile.   CT of the abdomen and pelvis with prostatomegaly, stable appearance of gallbladder with mild, stable central intrahepatic biliary dilatation.   Patient developed colonic dilatation, concerned for early toxic megacolon. Placed on oral and rectal vancomycin plus IV metronidazole. Surgery has recommended medical therapy.   Patient very weak and deconditioned, poor oral intake, was briefly on TPN for supportive nutrition.    06/10 -colitis slowly improving overnight staff did not report any loose BMs overnight potassium 3.5.   Assessment & Plan:   Principal Problem:   Colitis due to Clostridioides difficile Active Problems:   CAD (coronary artery disease)   Benign essential HTN   COPD (chronic obstructive pulmonary disease) (HCC)   Chronic renal disease, stage III (HCC)   BPH (benign prostatic hyperplasia)   Severe sepsis (HCC)    HOH (hard of hearing)   Acute on chronic cholecystitis   Hypokalemia   Acute delirium   Palliative care by specialist   Goals of care, counseling/discussion   General weakness   Toxic megacolon ([place])    1.  Severe sepsis due to [name] (end-organ damage AKI and metabolic encephalopathy) toxic megacolon. Today his abdomen is more distended but continue to be soft, continue to have watery diarrhea and has been leaking around the rectal tube.   Continue with oral vancomycin, will likely need a prolonged therapy, until clinical improvement, continue with IV metronidazole while hospitalized.  His po intake has been poor, continue to encourage nutrition. His TPN has been discontinued.     2. AKI with Hypokalemia/ hypernatremia/ hypomagnesemia/ non anion gap metabolic acidosis.  K today up to 3.5 with serum cr 0,97 and bicarbonate at 23 and Na 142. Mg is 1.5   Continue with K correction, replete magnesium.   3. Acute metabolic encephalopathy, delirium  On dysphagia 3 diet and aspiration precautions.  Patient has been more confused and agitated at night, last night he required physical restrains.   Add trazodone tonight and continue close neuro checks, continue restrains if needed.     4. HTN/ dyslipidemia  Off antihypertensive medications, continue with statin therapy.     5, COPD No clinical signs of acute exacerbation. Continue with Breo Ellipta   6. Paroxysmal atrial fibrillation. No anticoagulation due to risk of bleeding and fall risk.    On metoprolol 50 mg po bid, for rate control with good toleration. Plan to start patient on anticoagulation when patient recovers from C diff.   Target K at 4 and Mg at 2.  7. T2DM/ dyslipidemia.  Today his fasting glucose is 100. On sliding scale for glucose cover and monitoring   8 nonsustained V. tach replete electrolytes potassium and magnesium keep above 4 and 2 respectively.   Nutrition Problem: Increased nutrient  needs Etiology: acute illness, post-op healing  Signs/Symptoms: estimated needs Interventions: Boost Breeze, Ensure Enlive (each supplement provides 350kcal and 20 grams of protein), MVI  Estimated body mass index is 24.65 kg/m as calculated from the following:   Height as of this encounter: 5\' 7"  (1.702 m).   Weight as of this encounter: 71.4 kg.  DVT prophylaxis: Lovenox  code Status: DO NOT RESUSCITATE Family Communication: Discussed with wife over the phone Disposition Plan:  Status is: Inpatient  Remains inpatient appropriate because:Persistent severe electrolyte disturbances  Dispo:  Patient From: Home  Planned Disposition: Home with Health Care Svc  Medically stable for discharge: No         Consultants:  General surgery  Procedures: None Antimicrobials: Vancomycin and Flagyl Subjective: Patient resting in bed finally his diarrhea is slowing down potassium 3.5 mag low wife is still persistent on taking him home Telemetry reported patient had 12 beats of V. tach while I was in the room with him he was not in any distress and he was asymptomatic with no chest pain or shortness of breath continues to have electrolyte abnormalities K is 3.5 with a mag of 1.5.  Objective: Vitals:   01/19/21 2116 01/20/21 0604 01/20/21 0832 01/20/21 1040  BP: 136/76 (!) 148/77  129/66  Pulse: 89 74  88  Resp: 18     Temp: 98.9 F (37.2 C) 97.6 F (36.4 C)    TempSrc: Oral Oral    SpO2: 98% 95% 96%   Weight:      Height:        Intake/Output Summary (Last 24 hours) at 01/20/2021 1412 Last data filed at 01/20/2021 1000 Gross per 24 hour  Intake 615.5 ml  Output 650 ml  Net -34.5 ml   Filed Weights   01/17/21 0349 01/18/21 0500 01/19/21 0500  Weight: 71.9 kg 69.7 kg 71.4 kg    Examination:  General exam: Appears calm and comfortable  Respiratory system: Clear to auscultation. Respiratory effort normal. Cardiovascular system: S1 & S2 heard, RRR. No JVD, murmurs, rubs,  gallops or clicks. No pedal edema. Gastrointestinal system: Abdomen is nondistended, soft and nontender. No organomegaly or masses felt. Normal bowel sounds heard. Central nervous system: Alert and oriented. No focal neurological deficits. Extremities: Symmetric 5 x 5 power. Skin: No rashes, lesions or ulcers Psychiatry: Judgement and insight appear normal. Mood & affect appropriate.     Data Reviewed: I have personally reviewed following labs and imaging studies  CBC: Recent Labs  Lab 01/18/21 0424 01/19/21 0342 01/20/21 0328  WBC 6.3 5.2 4.3  NEUTROABS 4.3  --   --   HGB 9.2* 8.7* 9.2*  HCT 29.7* 28.1* 29.1*  MCV 94.0 91.8 93.0  PLT 246 219 169   Basic Metabolic Panel: Recent Labs  Lab 01/15/21 0328 01/16/21 0300 01/17/21 0252 01/18/21 0424 01/19/21 0342 01/20/21 0328  NA 139 140 142 144 142 142  K 3.0* 2.7* 3.5 2.9* 2.9* 3.5  CL 111 111 112* 115* 115* 115*  CO2 22 22 23  21* 23 24  GLUCOSE 105* 96 100* 90 145* 96  BUN 13 11 11 9  7* 7*  CREATININE 0.94 0.92 0.87 0.92 0.90 0.93  CALCIUM 7.7* 7.7* 8.2* 8.2* 7.8* 8.0*  MG 1.5* 1.9  2.0  --   --  1.5*  PHOS  --   --   --   --   --  2.5   GFR: Estimated Creatinine Clearance: 52.3 mL/min (by C-G formula based on SCr of 0.93 mg/dL). Liver Function Tests: Recent Labs  Lab 01/20/21 0328  ALBUMIN 2.4*   No results for input(s): LIPASE, AMYLASE in the last 168 hours. No results for input(s): AMMONIA in the last 168 hours. Coagulation Profile: No results for input(s): INR, PROTIME in the last 168 hours. Cardiac Enzymes: No results for input(s): CKTOTAL, CKMB, CKMBINDEX, TROPONINI in the last 168 hours. BNP (last 3 results) No results for input(s): PROBNP in the last 8760 hours. HbA1C: No results for input(s): HGBA1C in the last 72 hours. CBG: Recent Labs  Lab 01/19/21 2116  GLUCAP 96   Lipid Profile: No results for input(s): CHOL, HDL, LDLCALC, TRIG, CHOLHDL, LDLDIRECT in the last 72 hours. Thyroid Function  Tests: No results for input(s): TSH, T4TOTAL, FREET4, T3FREE, THYROIDAB in the last 72 hours. Anemia Panel: No results for input(s): VITAMINB12, FOLATE, FERRITIN, TIBC, IRON, RETICCTPCT in the last 72 hours. Sepsis Labs: No results for input(s): PROCALCITON, LATICACIDVEN in the last 168 hours.  No results found for this or any previous visit (from the past 240 hour(s)).       Radiology Studies: No results found.      Scheduled Meds:  aspirin EC  81 mg Oral Daily   budesonide (PULMICORT) nebulizer solution  0.5 mg Nebulization BID   Chlorhexidine Gluconate Cloth  6 each Topical Daily   enoxaparin (LOVENOX) injection  40 mg Subcutaneous Q24H   feeding supplement  1 Container Oral Q24H   feeding supplement  237 mL Oral BID BM   haloperidol lactate  2 mg Intravenous Once   mouth rinse  15 mL Mouth Rinse BID   metoprolol tartrate  50 mg Oral BID   multivitamin with minerals  1 tablet Oral Daily   simvastatin  40 mg Oral QPM   sodium chloride flush  10-40 mL Intracatheter Q12H   sodium chloride flush  3 mL Intravenous Q12H   tamsulosin  0.4 mg Oral Daily   traZODone  50 mg Oral QHS   vancomycin  125 mg Oral QID   Continuous Infusions:  sodium chloride 250 mL (01/19/21 1253)     LOS: 24 days    Time spent:     Georgette Shell, MD 01/20/2021, 2:12 PM

## 2021-01-20 NOTE — Progress Notes (Signed)
[facility] Infect[facility]    Date of Admission:  12/27/2020      ID: Leon Estes is a 87 y.o. male with  severe cdifficile Principal Problem:   Colitis due to Clostridioides difficile Active Problems:   CAD (coronary artery disease)   Benign essential HTN   COPD (chronic obstructive pulmonary disease) (HCC)   Chronic renal disease, stage III (HCC)   BPH (benign prostatic hyperplasia)   Severe sepsis (HCC)   HOH (hard of hearing)   Acute on chronic cholecystitis   Hypokalemia   Acute delirium   Palliative care by specialist   Goals of care, counseling/discussion   General weakness   Toxic megacolon ([place])    Subjective: Has not had diarrhea in the last 12 hrs  Medications:   aspirin EC  81 mg Oral Daily   budesonide (PULMICORT) nebulizer solution  0.5 mg Nebulization BID   Chlorhexidine Gluconate Cloth  6 each Topical Daily   enoxaparin (LOVENOX) injection  40 mg Subcutaneous Q24H   feeding supplement  1 Container Oral Q24H   feeding supplement  237 mL Oral BID BM   haloperidol lactate  2 mg Intravenous Once   mouth rinse  15 mL Mouth Rinse BID   metoprolol tartrate  50 mg Oral BID   multivitamin with minerals  1 tablet Oral Daily   simvastatin  40 mg Oral QPM   sodium chloride flush  10-40 mL Intracatheter Q12H   sodium chloride flush  3 mL Intravenous Q12H   tamsulosin  0.4 mg Oral Daily   traZODone  50 mg Oral QHS   vancomycin  125 mg Oral QID    Objective: Vital signs in last 24 hours: Temp:  [97.6 F (36.4 C)-98.9 F (37.2 C)] 97.6 F (36.4 C) (06/10 0604) Pulse Rate:  [74-89] 88 (06/10 1040) Resp:  [18] 18 (06/09 2116) BP: (129-148)/(66-77) 129/66 (06/10 1040) SpO2:  [95 %-99 %] 96 % (06/10 0832) Physical Exam  Constitutional: He is oriented to person, place, and time. He appears well-developed and well-nourished. No distress.  HENT:  Mouth/Throat: Oropharynx is clear and moist. No oropharyngeal exudate.  Cardiovascular: Normal  rate, regular rhythm and normal heart sounds. Exam reveals no gallop and no friction rub.  No murmur heard.  Pulmonary/Chest: Effort normal and breath sounds normal. No respiratory distress. He has no wheezes.  Abdominal: Soft. Bowel sounds are normal. He exhibits no distension. There is no tenderness.  Lymphadenopathy:  He has no cervical adenopathy.  Neurological: He is alert and oriented to person, place, and time.  Skin: Skin is warm and dry. No rash noted. No erythema.  Psychiatric: He has a normal mood and affect. His behavior is normal.    Lab Results Recent Labs    01/19/21 0342 01/20/21 0328  WBC 5.2 4.3  HGB 8.7* 9.2*  HCT 28.1* 29.1*  NA 142 142  K 2.9* 3.5  CL 115* 115*  CO2 23 24  BUN 7* 7*  CREATININE 0.90 0.93   Liver Panel Recent Labs    01/20/21 0328  ALBUMIN 2.4*   Sedimentation Rate No results for input(s): ESRSEDRATE in the last 72 hours. C-Reactive Protein No results for input(s): CRP in the last 72 hours.  Microbiology:  Studies/Results: No results found.   Assessment/Plan: Protracted severe cdifficile colitis = now improving. Recommend 10 day of 125mg  qID, follwed by 7 day of TID dosing, then 7 days BID dosing, then 7 days of daily dosing.  Encourage  good nutrition  [facility] Cell: [phone] Pager: [phone]  01/20/2021, 4:44 PM

## 2021-01-21 LAB — CBC
HCT: 30.1 % — ABNORMAL LOW (ref 39.0–52.0)
Hemoglobin: 9.3 g/dL — ABNORMAL LOW (ref 13.0–17.0)
MCH: 28.5 pg (ref 26.0–34.0)
MCHC: 30.9 g/dL (ref 30.0–36.0)
MCV: 92.3 fL (ref 80.0–100.0)
Platelets: 227 10*3/uL (ref 150–400)
RBC: 3.26 MIL/uL — ABNORMAL LOW (ref 4.22–5.81)
RDW: 18.4 % — ABNORMAL HIGH (ref 11.5–15.5)
WBC: 3.9 10*3/uL — ABNORMAL LOW (ref 4.0–10.5)
nRBC: 0 % (ref 0.0–0.2)

## 2021-01-21 LAB — BASIC METABOLIC PANEL
Anion gap: 6 (ref 5–15)
BUN: 7 mg/dL — ABNORMAL LOW (ref 8–23)
CO2: 23 mmol/L (ref 22–32)
Calcium: 8.2 mg/dL — ABNORMAL LOW (ref 8.9–10.3)
Chloride: 114 mmol/L — ABNORMAL HIGH (ref 98–111)
Creatinine, Ser: 0.89 mg/dL (ref 0.61–1.24)
GFR, Estimated: >60 mL/min (ref >60)
Glucose, Bld: 91 mg/dL (ref 70–99)
Potassium: 3.4 mmol/L — ABNORMAL LOW (ref 3.5–5.1)
Sodium: 143 mmol/L (ref 135–145)

## 2021-01-21 LAB — MAGNESIUM: Magnesium: 2.1 mg/dL (ref 1.7–2.4)

## 2021-01-21 MED ORDER — POTASSIUM CHLORIDE CRYS ER 20 MEQ PO TBCR
40.0000 meq | EXTENDED_RELEASE_TABLET | Freq: Once | ORAL | Status: AC
  Administered 2021-01-21: 40 meq via ORAL
  Filled 2021-01-21: qty 2

## 2021-01-21 MED ORDER — VANCOMYCIN 50 MG/ML ORAL SOLUTION
125.0000 mg | Freq: Four times a day (QID) | ORAL | Status: DC
  Administered 2021-01-21 – 2021-01-23 (×9): 125 mg via ORAL
  Filled 2021-01-21 (×10): qty 2.5

## 2021-01-21 MED ORDER — POTASSIUM CHLORIDE CRYS ER 20 MEQ PO TBCR
40.0000 meq | EXTENDED_RELEASE_TABLET | Freq: Two times a day (BID) | ORAL | Status: AC
  Administered 2021-01-21 (×2): 40 meq via ORAL
  Filled 2021-01-21 (×2): qty 2
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
## 2021-01-21 NOTE — Progress Notes (Signed)
PROGRESS NOTE    Leon Estes  ZOX:[id number] DOB: Nov 19, 1930 DOA: 12/27/2020 PCP: Venia Carbon, MD   Brief Narrative:87 year old male past medical history for coronary artery disease, COPD, chronic kidney disease, hypertension, dyslipidemia and BPH who presented with fevers and weakness.  Recent hospitalization for cholecystitis, medically treated 4/16-4/19/2022.  At home patient had recurrent fevers and diaphoresis that prompted him to come back to the hospital.  On his initial physical examination his blood pressure was 142/88, heart rate 96, respiratory rate 16, temperature 103.1 F rectally oxygenation 90% on room air.  His lungs are clear to auscultation bilaterally, heart S1-S2, present, rhythmic, abdomen soft nontender, no lower extremity edema.   Sodium 139, potassium 4.1, chloride 106, bicarb 24, glucose 133, BUN 22, creatinine 1.48, lactic acid 3.2, white count 15.3, hemoglobin 12.9, hematocrit 42.1, platelets 238. SARS COVID-19 negative.   Urinalysis specific gravity 1.039, 0-5 white cells > 300 protein.   Stool positive for C. Difficile.   CT of the abdomen and pelvis with prostatomegaly, stable appearance of gallbladder with mild, stable central intrahepatic biliary dilatation.   Patient developed colonic dilatation, concerned for early toxic megacolon. Placed on oral and rectal vancomycin plus IV metronidazole. Surgery has recommended medical therapy.   Patient very weak and deconditioned, poor oral intake, was briefly on TPN for supportive nutrition.    06/10 -colitis slowly improving overnight staff did not report any loose BMs overnight potassium 3.5.   Assessment & Plan:   Principal Problem:   Colitis due to Clostridioides difficile Active Problems:   CAD (coronary artery disease)   Benign essential HTN   COPD (chronic obstructive pulmonary disease) (HCC)   Chronic renal disease, stage III (HCC)   BPH (benign prostatic hyperplasia)   Severe sepsis (HCC)    HOH (hard of hearing)   Acute on chronic cholecystitis   Hypokalemia   Acute delirium   Palliative care by specialist   Goals of care, counseling/discussion   General weakness   Toxic megacolon ([place])    1.  Severe sepsis due to severe [name] (end-organ damage AKI and metabolic encephalopathy) toxic megacolon. Nursing staff reports overnight he had 2 loose BMs.  His potassium today is 3.4.   Continue with oral vancomycin, with very slow taper.continue with IV metronidazole while hospitalized.  His po intake has been poor, continue to encourage nutrition. His TPN has been discontinued.     2. AKI with Hypokalemia/ hypernatremia/ hypomagnesemia/ non anion gap metabolic acidosis.  K today up to 3.4 with serum cr 0,97 and bicarbonate at 23 and Na 142. Mg is 2.1 replete potassium.     3. Acute metabolic encephalopathy, delirium  On dysphagia 3 diet and aspiration precautions.  Patient has been more confused and agitated at night, last night he required physical restrains.   Add trazodone tonight and continue close neuro checks, continue restrains if needed.     4. HTN/ dyslipidemia  Off antihypertensive medications, continue with statin therapy.     5, COPD No clinical signs of acute exacerbation. Continue with Breo Ellipta   6. Paroxysmal atrial fibrillation. No anticoagulation due to risk of bleeding and fall risk.    On metoprolol 50 mg po bid, for rate control with good toleration. Target K at 4 and Mg at 2.     7. T2DM/ dyslipidemia.  Today his fasting glucose is 100. On sliding scale for glucose cover and monitoring   8 nonsustained V. tach replete electrolytes potassium and magnesium keep above  4 and 2 respectively.   Nutrition Problem: Increased nutrient needs Etiology: acute illness, post-op healing  Signs/Symptoms: estimated needs Interventions: Boost Breeze, Ensure Enlive (each supplement provides 350kcal and 20 grams of protein), MVI  Estimated body  mass index is 24.65 kg/m as calculated from the following:   Height as of this encounter: 5\' 7"  (1.702 m).   Weight as of this encounter: 71.4 kg.  DVT prophylaxis: Lovenox  code Status: DO NOT RESUSCITATE Family Communication: Discussed with wife over the phone Disposition Plan:  Status is: Inpatient  Remains inpatient appropriate because:Persistent severe electrolyte disturbances  Dispo:  Patient From: Home  Planned Disposition: Home with Health Care Svc  Medically stable for discharge: If remains stable discharge in 24 to 48 hours         Consultants:  General surgery  Procedures: None Antimicrobials: Vancomycin and Flagyl Subjective: He is resting in bed Overnight staff reported 2 episodes of loose BM Potassium 3.4 Objective: Vitals:   01/21/21 0429 01/21/21 0802 01/21/21 0803 01/21/21 0907  BP: (!) 158/69   (!) 152/73  Pulse: 93   89  Resp: (!) 22     Temp: 98.8 F (37.1 C)     TempSrc: Oral     SpO2: 98% 96% 96%   Weight:      Height:        Intake/Output Summary (Last 24 hours) at 01/21/2021 1320 Last data filed at 01/21/2021 1100 Gross per 24 hour  Intake 480 ml  Output 700 ml  Net -220 ml    Filed Weights   01/17/21 0349 01/18/21 0500 01/19/21 0500  Weight: 71.9 kg 69.7 kg 71.4 kg    Examination:  General exam: Appears calm and comfortable  Respiratory system: Clear to auscultation. Respiratory effort normal. Cardiovascular system: S1 & S2 heard, RRR. No JVD, murmurs, rubs, gallops or clicks. No pedal edema. Gastrointestinal system: Abdomen is nondistended, soft and nontender. No organomegaly or masses felt. Normal bowel sounds heard. Central nervous system: Alert and oriented. No focal neurological deficits. Extremities: Symmetric 5 x 5 power. Skin: No rashes, lesions or ulcers Psychiatry: Judgement and insight appear normal. Mood & affect appropriate.     Data Reviewed: I have personally reviewed following labs and imaging  studies  CBC: Recent Labs  Lab 01/18/21 0424 01/19/21 0342 01/20/21 0328 01/21/21 0330  WBC 6.3 5.2 4.3 3.9*  NEUTROABS 4.3  --   --   --   HGB 9.2* 8.7* 9.2* 9.3*  HCT 29.7* 28.1* 29.1* 30.1*  MCV 94.0 91.8 93.0 92.3  PLT 246 219 206 130    Basic Metabolic Panel: Recent Labs  Lab 01/15/21 0328 01/16/21 0300 01/17/21 0252 01/18/21 0424 01/19/21 0342 01/20/21 0328 01/21/21 0330  NA 139 140 142 144 142 142 143  K 3.0* 2.7* 3.5 2.9* 2.9* 3.5 3.4*  CL 111 111 112* 115* 115* 115* 114*  CO2 22 22 23  21* 23 24 23   GLUCOSE 105* 96 100* 90 145* 96 91  BUN 13 11 11 9  7* 7* 7*  CREATININE 0.94 0.92 0.87 0.92 0.90 0.93 0.89  CALCIUM 7.7* 7.7* 8.2* 8.2* 7.8* 8.0* 8.2*  MG 1.5* 1.9 2.0  --   --  1.5* 2.1  PHOS  --   --   --   --   --  2.5  --     GFR: Estimated Creatinine Clearance: 54.7 mL/min (by C-G formula based on SCr of 0.89 mg/dL). Liver Function Tests: Recent Labs  Lab 01/20/21 [phone]  ALBUMIN 2.4*    No results for input(s): LIPASE, AMYLASE in the last 168 hours. No results for input(s): AMMONIA in the last 168 hours. Coagulation Profile: No results for input(s): INR, PROTIME in the last 168 hours. Cardiac Enzymes: No results for input(s): CKTOTAL, CKMB, CKMBINDEX, TROPONINI in the last 168 hours. BNP (last 3 results) No results for input(s): PROBNP in the last 8760 hours. HbA1C: No results for input(s): HGBA1C in the last 72 hours. CBG: Recent Labs  Lab 01/19/21 2116  GLUCAP 96    Lipid Profile: No results for input(s): CHOL, HDL, LDLCALC, TRIG, CHOLHDL, LDLDIRECT in the last 72 hours. Thyroid Function Tests: No results for input(s): TSH, T4TOTAL, FREET4, T3FREE, THYROIDAB in the last 72 hours. Anemia Panel: No results for input(s): VITAMINB12, FOLATE, FERRITIN, TIBC, IRON, RETICCTPCT in the last 72 hours. Sepsis Labs: No results for input(s): PROCALCITON, LATICACIDVEN in the last 168 hours.  No results found for this or any previous visit (from the  past 240 hour(s)).       Radiology Studies: No results found.      Scheduled Meds:  aspirin EC  81 mg Oral Daily   budesonide (PULMICORT) nebulizer solution  0.5 mg Nebulization BID   Chlorhexidine Gluconate Cloth  6 each Topical Daily   enoxaparin (LOVENOX) injection  40 mg Subcutaneous Q24H   feeding supplement  1 Container Oral Q24H   feeding supplement  237 mL Oral BID BM   haloperidol lactate  2 mg Intravenous Once   mouth rinse  15 mL Mouth Rinse BID   metoprolol tartrate  50 mg Oral BID   multivitamin with minerals  1 tablet Oral Daily   simvastatin  40 mg Oral QPM   sodium chloride flush  10-40 mL Intracatheter Q12H   sodium chloride flush  3 mL Intravenous Q12H   tamsulosin  0.4 mg Oral Daily   traZODone  50 mg Oral QHS   vancomycin  125 mg Oral Q6H   Continuous Infusions:  sodium chloride 250 mL (01/19/21 1253)     LOS: 25 days    Time spent:     Georgette Shell, MD 01/21/2021, 1:20 PM

## 2021-01-22 LAB — BASIC METABOLIC PANEL
Anion gap: 9 (ref 5–15)
BUN: 6 mg/dL — ABNORMAL LOW (ref 8–23)
CO2: 23 mmol/L (ref 22–32)
Calcium: 8.3 mg/dL — ABNORMAL LOW (ref 8.9–10.3)
Chloride: 110 mmol/L (ref 98–111)
Creatinine, Ser: 0.82 mg/dL (ref 0.61–1.24)
GFR, Estimated: >60 mL/min (ref >60)
Glucose, Bld: 111 mg/dL — ABNORMAL HIGH (ref 70–99)
Potassium: 3 mmol/L — ABNORMAL LOW (ref 3.5–5.1)
Sodium: 142 mmol/L (ref 135–145)

## 2021-01-22 LAB — MAGNESIUM: Magnesium: 1.6 mg/dL — ABNORMAL LOW (ref 1.7–2.4)

## 2021-01-22 MED ORDER — MAGNESIUM SULFATE 4 GM/100ML IV SOLN
4.0000 g | Freq: Once | INTRAVENOUS | Status: AC
  Administered 2021-01-22: 4 g via INTRAVENOUS
  Filled 2021-01-22: qty 100

## 2021-01-22 MED ORDER — POTASSIUM CHLORIDE CRYS ER 20 MEQ PO TBCR
40.0000 meq | EXTENDED_RELEASE_TABLET | ORAL | Status: AC
Start: 2021-01-22 — End: 2021-01-22
  Administered 2021-01-22 (×2): 40 meq via ORAL
  Filled 2021-01-22 (×2): qty 2

## 2021-01-22 NOTE — Progress Notes (Signed)
PROGRESS NOTE    Leon Estes  EQA:[id number] DOB: Jul 23, 1931 DOA: 12/27/2020 PCP: Venia Carbon, MD   Brief Narrative:87 year old male past medical history for coronary artery disease, COPD, chronic kidney disease, hypertension, dyslipidemia and BPH who presented with fevers and weakness.  Recent hospitalization for cholecystitis, medically treated 4/16-4/19/2022.  At home patient had recurrent fevers and diaphoresis that prompted him to come back to the hospital.  On his initial physical examination his blood pressure was 142/88, heart rate 96, respiratory rate 16, temperature 103.1 F rectally oxygenation 90% on room air.  His lungs are clear to auscultation bilaterally, heart S1-S2, present, rhythmic, abdomen soft nontender, no lower extremity edema.   Sodium 139, potassium 4.1, chloride 106, bicarb 24, glucose 133, BUN 22, creatinine 1.48, lactic acid 3.2, white count 15.3, hemoglobin 12.9, hematocrit 42.1, platelets 238. SARS COVID-19 negative.   Urinalysis specific gravity 1.039, 0-5 white cells > 300 protein.   Stool positive for C. Difficile.   CT of the abdomen and pelvis with prostatomegaly, stable appearance of gallbladder with mild, stable central intrahepatic biliary dilatation.   Patient developed colonic dilatation, concerned for early toxic megacolon. Placed on oral and rectal vancomycin plus IV metronidazole. Surgery has recommended medical therapy.   Patient very weak and deconditioned, poor oral intake, was briefly on TPN for supportive nutrition.    06/10 -colitis slowly improving overnight staff did not report any loose BMs overnight potassium 3.5.   Assessment & Plan:   Principal Problem:   Colitis due to Clostridioides difficile Active Problems:   CAD (coronary artery disease)   Benign essential HTN   COPD (chronic obstructive pulmonary disease) (HCC)   Chronic renal disease, stage III (HCC)   BPH (benign prostatic hyperplasia)   Severe sepsis (HCC)    HOH (hard of hearing)   Acute on chronic cholecystitis   Hypokalemia   Acute delirium   Palliative care by specialist   Goals of care, counseling/discussion   General weakness   Toxic megacolon ([place])    1.  Severe sepsis due to severe [name] (end-organ damage AKI and metabolic encephalopathy) toxic megacolon. Nursing staff reports overnight he had 2 loose BMs.  His potassium today is 3.0 mag is 1.5 continue replacement of potassium and magnesium.  His wife is very concerned that he will not eat in the rehab or here and that she can make him eat at home.  She is aware that he is very weak but she does want to take him home.     2. AKI with Hypokalemia/ hypernatremia/ hypomagnesemia/ non anion gap metabolic acidosis.  K today up to 3.4 with serum cr 0,97 and bicarbonate at 23 and Na 142. Mg is 2.1 replete potassium.     3. Acute metabolic encephalopathy, delirium  On dysphagia 3 diet and aspiration precautions.  Patient has been more confused and agitated at night, last night he required physical restrains.   Add trazodone tonight and continue close neuro checks, continue restrains if needed.     4. HTN/ dyslipidemia  Off antihypertensive medications, continue with statin therapy.     5, COPD No clinical signs of acute exacerbation. Continue with Breo Ellipta   6. Paroxysmal atrial fibrillation. No anticoagulation due to risk of bleeding and fall risk.    On metoprolol 50 mg po bid, for rate control with good toleration. Target K at 4 and Mg at 2.     7. T2DM/ dyslipidemia.  Today his fasting glucose is 100. On  sliding scale for glucose cover and monitoring   8 nonsustained V. tach replete electrolytes potassium and magnesium keep above 4 and 2 respectively.   Nutrition Problem: Increased nutrient needs Etiology: acute illness, post-op healing  Signs/Symptoms: estimated needs Interventions: Boost Breeze, Ensure Enlive (each supplement provides 350kcal and 20 grams  of protein), MVI  Estimated body mass index is 23.76 kg/m as calculated from the following:   Height as of this encounter: 5\' 7"  (1.702 m).   Weight as of this encounter: 68.8 kg.  DVT prophylaxis: Lovenox  code Status: DO NOT RESUSCITATE Family Communication: Discussed with wife over the phone Disposition Plan:  Status is: Inpatient  Remains inpatient appropriate because:Persistent severe electrolyte disturbances  Dispo:  Patient From: Home  Planned Disposition: Home with Health Care Svc  Medically stable for discharge: If remains stable discharge in 24 to 48 hours         Consultants:  General surgery  Procedures: None Antimicrobials: Vancomycin and Flagyl Subjective: He is resting in bed Overnight staff reported 2 episodes of loose BM Potassium 3.4 Objective: Vitals:   01/22/21 0432 01/22/21 0732 01/22/21 0942 01/22/21 1207  BP: (!) 151/84  (!) 150/82 132/90  Pulse: 76  74 79  Resp: 18   16  Temp: 97.6 F (36.4 C)   97.7 F (36.5 C)  TempSrc: Oral   Oral  SpO2: 97% 98%  98%  Weight:      Height:        Intake/Output Summary (Last 24 hours) at 01/22/2021 1243 Last data filed at 01/22/2021 1212 Gross per 24 hour  Intake 420 ml  Output 1250 ml  Net -830 ml    Filed Weights   01/18/21 0500 01/19/21 0500 01/22/21 0428  Weight: 69.7 kg 71.4 kg 68.8 kg    Examination:  General exam: Appears calm and comfortable  Respiratory system: Clear to auscultation. Respiratory effort normal. Cardiovascular system: S1 & S2 heard, RRR. No JVD, murmurs, rubs, gallops or clicks. No pedal edema. Gastrointestinal system: Abdomen is nondistended, soft and nontender. No organomegaly or masses felt. Normal bowel sounds heard. Central nervous system: Alert and oriented. No focal neurological deficits. Extremities: Symmetric 5 x 5 power. Skin: No rashes, lesions or ulcers Psychiatry: Judgement and insight appear normal. Mood & affect appropriate.     Data Reviewed: I have  personally reviewed following labs and imaging studies  CBC: Recent Labs  Lab 01/18/21 0424 01/19/21 0342 01/20/21 0328 01/21/21 0330  WBC 6.3 5.2 4.3 3.9*  NEUTROABS 4.3  --   --   --   HGB 9.2* 8.7* 9.2* 9.3*  HCT 29.7* 28.1* 29.1* 30.1*  MCV 94.0 91.8 93.0 92.3  PLT 246 219 206 885    Basic Metabolic Panel: Recent Labs  Lab 01/16/21 0300 01/17/21 0252 01/18/21 0424 01/19/21 0342 01/20/21 0328 01/21/21 0330 01/22/21 0352  NA 140 142 144 142 142 143 142  K 2.7* 3.5 2.9* 2.9* 3.5 3.4* 3.0*  CL 111 112* 115* 115* 115* 114* 110  CO2 22 23 21* 23 24 23 23   GLUCOSE 96 100* 90 145* 96 91 111*  BUN 11 11 9  7* 7* 7* 6*  CREATININE 0.92 0.87 0.92 0.90 0.93 0.89 0.82  CALCIUM 7.7* 8.2* 8.2* 7.8* 8.0* 8.2* 8.3*  MG 1.9 2.0  --   --  1.5* 2.1 1.6*  PHOS  --   --   --   --  2.5  --   --     GFR: Estimated Creatinine  Clearance: 59.3 mL/min (by C-G formula based on SCr of 0.82 mg/dL). Liver Function Tests: Recent Labs  Lab 01/20/21 0328  ALBUMIN 2.4*    No results for input(s): LIPASE, AMYLASE in the last 168 hours. No results for input(s): AMMONIA in the last 168 hours. Coagulation Profile: No results for input(s): INR, PROTIME in the last 168 hours. Cardiac Enzymes: No results for input(s): CKTOTAL, CKMB, CKMBINDEX, TROPONINI in the last 168 hours. BNP (last 3 results) No results for input(s): PROBNP in the last 8760 hours. HbA1C: No results for input(s): HGBA1C in the last 72 hours. CBG: Recent Labs  Lab 01/19/21 2116  GLUCAP 96    Lipid Profile: No results for input(s): CHOL, HDL, LDLCALC, TRIG, CHOLHDL, LDLDIRECT in the last 72 hours. Thyroid Function Tests: No results for input(s): TSH, T4TOTAL, FREET4, T3FREE, THYROIDAB in the last 72 hours. Anemia Panel: No results for input(s): VITAMINB12, FOLATE, FERRITIN, TIBC, IRON, RETICCTPCT in the last 72 hours. Sepsis Labs: No results for input(s): PROCALCITON, LATICACIDVEN in the last 168 hours.  No results  found for this or any previous visit (from the past 240 hour(s)).       Radiology Studies: No results found.      Scheduled Meds:  aspirin EC  81 mg Oral Daily   budesonide (PULMICORT) nebulizer solution  0.5 mg Nebulization BID   Chlorhexidine Gluconate Cloth  6 each Topical Daily   enoxaparin (LOVENOX) injection  40 mg Subcutaneous Q24H   feeding supplement  1 Container Oral Q24H   feeding supplement  237 mL Oral BID BM   haloperidol lactate  2 mg Intravenous Once   mouth rinse  15 mL Mouth Rinse BID   metoprolol tartrate  50 mg Oral BID   multivitamin with minerals  1 tablet Oral Daily   simvastatin  40 mg Oral QPM   sodium chloride flush  10-40 mL Intracatheter Q12H   sodium chloride flush  3 mL Intravenous Q12H   tamsulosin  0.4 mg Oral Daily   traZODone  50 mg Oral QHS   vancomycin  125 mg Oral Q6H   Continuous Infusions:  sodium chloride 250 mL (01/19/21 1253)     LOS: 26 days    Time spent:     Georgette Shell, MD 01/22/2021, 12:43 PM

## 2021-01-23 ENCOUNTER — Other Ambulatory Visit (HOSPITAL_COMMUNITY): Payer: Self-pay

## 2021-01-23 ENCOUNTER — Telehealth: Payer: Self-pay

## 2021-01-23 LAB — BASIC METABOLIC PANEL
Anion gap: 7 (ref 5–15)
BUN: 6 mg/dL — ABNORMAL LOW (ref 8–23)
CO2: 24 mmol/L (ref 22–32)
Calcium: 8.3 mg/dL — ABNORMAL LOW (ref 8.9–10.3)
Chloride: 112 mmol/L — ABNORMAL HIGH (ref 98–111)
Creatinine, Ser: 0.85 mg/dL (ref 0.61–1.24)
GFR, Estimated: >60 mL/min (ref >60)
Glucose, Bld: 96 mg/dL (ref 70–99)
Potassium: 3.2 mmol/L — ABNORMAL LOW (ref 3.5–5.1)
Sodium: 143 mmol/L (ref 135–145)

## 2021-01-23 LAB — MAGNESIUM: Magnesium: 2 mg/dL (ref 1.7–2.4)

## 2021-01-23 MED ORDER — POTASSIUM CHLORIDE CRYS ER 10 MEQ PO TBCR: 40.0000 meq | EXTENDED_RELEASE_TABLET | Freq: Every day | ORAL | 0 refills | Status: DC

## 2021-01-23 MED ORDER — POTASSIUM CHLORIDE CRYS ER 20 MEQ PO TBCR
60.0000 meq | EXTENDED_RELEASE_TABLET | Freq: Once | ORAL | Status: AC
  Administered 2021-01-23: 60 meq via ORAL
  Filled 2021-01-23: qty 3

## 2021-01-23 MED ORDER — VANCOMYCIN HCL 125 MG PO CAPS: 125.0000 mg | ORAL_CAPSULE | Freq: Four times a day (QID) | ORAL | 0 refills | Status: DC

## 2021-01-23 MED ORDER — ADULT MULTIVITAMIN W/MINERALS CH
1.0000 | ORAL_TABLET | Freq: Every day | ORAL | Status: AC
Start: 2021-01-23 — End: ?

## 2021-01-23 NOTE — Telephone Encounter (Signed)
Transition Care Management Unsuccessful Follow-up Telephone Call  Date of discharge and from where:  01/23/2021, [facility]  Attempts:  1st Attempt  Reason for unsuccessful TCM follow-up call:  Unable to reach patient, may be an invalid number (home number in chart)

## 2021-01-23 NOTE — Discharge Summary (Signed)
Physician Discharge Summary  NOAL ABSHIER CBJ:[id number] DOB: Apr 22, 1931 DOA: 12/27/2020  PCP: Venia Carbon, MD  Admit date: 12/27/2020 Discharge date: 01/23/2021  Admitted From: Home Disposition: Home  Recommendations for Outpatient Follow-up:  Follow up with PCP in 1-2 weeks Please obtain BMP/CBC in one week  Home Health yes Equipment/Devices: None  Discharge Condition stable CODE STATUS: DNR Diet recommendation: Regular/cardiac  Brief/Interim Summary:87 year old male past medical history for coronary artery disease, COPD, chronic kidney disease, hypertension, dyslipidemia and BPH who presented with fevers and weakness.  Recent hospitalization for cholecystitis, medically treated 4/16-4/19/2022.  At home patient had recurrent fevers and diaphoresis that prompted him to come back to the hospital.  On his initial physical examination his blood pressure was 142/88, heart rate 96, respiratory rate 16, temperature 103.1 F rectally oxygenation 90% on room air.  His lungs are clear to auscultation bilaterally, heart S1-S2, present, rhythmic, abdomen soft nontender, no lower extremity edema.   Sodium 139, potassium 4.1, chloride 106, bicarb 24, glucose 133, BUN 22, creatinine 1.48, lactic acid 3.2, white count 15.3, hemoglobin 12.9, hematocrit 42.1, platelets 238. SARS COVID-19 negative.   Urinalysis specific gravity 1.039, 0-5 white cells > 300 protein.   Stool positive for C. Difficile.   CT of the abdomen and pelvis with prostatomegaly, stable appearance of gallbladder with mild, stable central intrahepatic biliary dilatation.   Patient developed colonic dilatation, concerned for early toxic megacolon. Placed on oral and rectal vancomycin plus IV metronidazole. Surgery has recommended medical therapy.   Patient very weak and deconditioned, poor oral intake, was briefly on TPN for supportive nutrition  Discharge Diagnoses:  Principal Problem:   Colitis due to Clostridioides  difficile Active Problems:   CAD (coronary artery disease)   Benign essential HTN   COPD (chronic obstructive pulmonary disease) (HCC)   Chronic renal disease, stage III (HCC)   BPH (benign prostatic hyperplasia)   Severe sepsis (HCC)   HOH (hard of hearing)   Acute on chronic cholecystitis   Hypokalemia   Acute delirium   Palliative care by specialist   Goals of care, counseling/discussion   General weakness   Toxic megacolon ([place])      1.  Severe sepsis due to severe [name] (end-organ damage AKI and metabolic encephalopathy) toxic megacolon.  This treated with oral vancomycin and IV Flagyl.  He is very deconditioned seen by physical therapy and recommended SNF.  However his wife was very concerned that he will not aid in rehab and that she wanted to take him home.  She is aware that he is very weak and he needs physical help in getting him out of bed however she still wanted to take him home.  Palliative to follow as an outpatient. He will be on a prolonged tapered vancomycin dosing.  He was given 70 tablets of vancomycin prescription on discharge. Vancomycin 125 mg 4 times a day for 7 days Then 3 times a day for 7 days Then 2 times a day for 7 days Then once a day for 7 days.   2. AKI with Hypokalemia/ hypernatremia/ hypomagnesemia/ non anion gap metabolic acidosis-resolved. His potassium was 3.4 on discharge.  3. Acute metabolic encephalopathy, delirium -resolving.  Continue dysphagia 3 diet at home    4. HTN/ dyslipidemia  Off antihypertensive medications, continue with statin therapy.     5, COPD No clinical signs of acute exacerbation. Continue with Breo Ellipta   6. Paroxysmal atrial fibrillation. No anticoagulation due to risk of bleeding and  fall risk.    On metoprolol 50 mg po bid, for rate control with good toleration.   7. T2DM/ dyslipidemia-continue home meds.   8 nonsustained V. Tach-he had a one-time episode of nonsustained V. tach 14 beats per  telemetry.  He was asymptomatic.  His EKG did not show any acute changes.  Potassium and magnesium was repleted.  Continue the blocker.     Nutrition Problem: Increased nutrient needs Etiology: acute illness, post-op healing    Signs/Symptoms: estimated needs     Interventions: Boost Breeze, Ensure Enlive (each supplement provides 350kcal and 20 grams of protein), MVI  Estimated body mass index is 23.76 kg/m as calculated from the following:   Height as of this encounter: 5\' 7"  (1.702 m).   Weight as of this encounter: 68.8 kg.  Discharge Instructions  Discharge Instructions     Diet - low sodium heart healthy   Complete by: As directed    Diet - low sodium heart healthy   Complete by: As directed    Increase activity slowly   Complete by: As directed    Increase activity slowly   Complete by: As directed       Allergies as of 01/23/2021       Reactions   Sulfa Antibiotics Rash   Benzalkonium Chloride    "Benzalkonium chloride is a quaternary ammonium antiseptic and disinfectant with actions and uses similar to those of other cationic surfactants. It is also used as an antimicrobial preservative for pharmaceutical products."   Neosporin [neomycin-bacitracin Zn-polymyx]    Augmentin [amoxicillin-pot Clavulanate] Rash   Terbinafine Rash   Terbinafine And Related Rash        Medication List     STOP taking these medications    betamethasone dipropionate 0.05 % cream   gentamicin cream 0.1 % Commonly known as: GARAMYCIN   lisinopril 2.5 MG tablet Commonly known as: ZESTRIL   NONFORMULARY OR COMPOUNDED ITEM       TAKE these medications    aspirin EC 81 MG tablet Take 81 mg by mouth daily.   budesonide-formoterol 160-4.5 MCG/ACT inhaler Commonly known as: SYMBICORT INHALE 2 PUFFS INTO THE LUNGS TWICE DAILY   gabapentin 300 MG capsule Commonly known as: NEURONTIN Take 300 mg by mouth at bedtime.   metoprolol succinate 25 MG 24 hr  tablet Commonly known as: TOPROL-XL Take 1 tablet (25 mg total) by mouth daily.   multivitamin with minerals Tabs tablet Take 1 tablet by mouth daily.   pantoprazole 40 MG tablet Commonly known as: PROTONIX TAKE 1 TABLET BY MOUTH ONCE A DAY   potassium chloride 10 MEQ tablet Commonly known as: KLOR-CON Take 4 tablets (40 mEq total) by mouth daily.   simvastatin 40 MG tablet Commonly known as: ZOCOR Take 40 mg by mouth every evening.   tamsulosin 0.4 MG Caps capsule Commonly known as: FLOMAX TAKE 1 CAPSULE BY MOUTH ONCE DAILY   traMADol 50 MG tablet Commonly known as: ULTRAM Take 50 mg by mouth 2 (two) times daily as needed for moderate pain.   vancomycin 125 MG capsule Commonly known as: VANCOCIN Take 1 capsule (125 mg total) by mouth 4 (four) times daily. For 7 days, then take 1 capsule by mouth 3 times daily for 7 days, then take 1 capsule by mouth 2 times daily for 7 days and then take 1 capsule by mouth once daily for 7 days.               Durable Medical  Equipment  (From admission, onward)           Start     Ordered   01/10/21 0935  For home use only DME Shower stool  Once        01/10/21 0934   01/10/21 0935  For home use only DME 3 n 1  Once        01/10/21 0935            Allergies  Allergen Reactions   Sulfa Antibiotics Rash   Benzalkonium Chloride     "Benzalkonium chloride is a quaternary ammonium antiseptic and disinfectant with actions and uses similar to those of other cationic surfactants. It is also used as an antimicrobial preservative for pharmaceutical products."   Neosporin [Neomycin-Bacitracin Zn-Polymyx]    Augmentin [Amoxicillin-Pot Clavulanate] Rash   Terbinafine Rash   Terbinafine And Related Rash    Consultations: Infectious disease  Procedures/Studies: CT ABDOMEN PELVIS WO CONTRAST  Result Date: 01/02/2021 CLINICAL DATA:  Abdominal distension, bowel obstruction suspected, infectious gastroenteritis or colitis,  possible toxic megacolon, C diff EXAM: CT ABDOMEN AND PELVIS WITHOUT CONTRAST TECHNIQUE: Multidetector CT imaging of the abdomen and pelvis was performed following the standard protocol without IV contrast. COMPARISON:  12/27/2020 FINDINGS: Lower chest: There is a new small right pleural effusion and associated atelectasis or consolidation. There is incompletely imaged heterogeneous airspace opacity in the posterior right upper lobe (series 7, image 1). Coronary artery calcifications. Hepatobiliary: No solid liver abnormality is seen. No gallstones, gallbladder wall thickening, or biliary dilatation. Pancreas: Unremarkable. No pancreatic ductal dilatation or surrounding inflammatory changes. Spleen: Normal in size without significant abnormality. Adrenals/Urinary Tract: Adrenal glands are unremarkable. Fluid attenuation cyst of the superior pole of the left kidney. Kidneys are otherwise normal, without renal calculi, solid lesion, or hydronephrosis. Bladder is unremarkable. Stomach/Bowel: Stomach is within normal limits. Appendix appears normal. The colon is diffusely distended and fluid-filled to the rectum, largest loops measuring 10.3 cm in caliber. Vascular/Lymphatic: Aortic atherosclerosis. No enlarged abdominal or pelvic lymph nodes. Reproductive: Prostatomegaly. Other: Fat containing left inguinal hernia. Trace ascites throughout the abdomen and pelvis. Anasarca. Musculoskeletal: No acute or significant osseous findings. Chronic bilateral pars defects of L5. IMPRESSION: 1. The colon is diffusely distended and fluid-filled to the rectum, largest loops measuring 10.3 cm in caliber. In the setting of known C diff colitis, this is concerning for toxic megacolon. 2. There is a new small right pleural effusion and associated atelectasis or consolidation. There is incompletely imaged heterogeneous airspace opacity in the posterior right upper lobe. Findings are concerning for infection or aspiration. Consider  dedicated imaging of the chest to further evaluate. 3. Trace ascites throughout the abdomen and pelvis. Anasarca. 4. Prostatomegaly. 5. Coronary artery disease. Aortic Atherosclerosis (ICD10-I70.0). Electronically Signed   By: Eddie Candle M.D.   On: 01/02/2021 15:16   CT Abdomen Pelvis Wo Contrast  Result Date: 12/27/2020 CLINICAL DATA:  Abdominal distention and fever. EXAM: CT ABDOMEN AND PELVIS WITHOUT CONTRAST TECHNIQUE: Multidetector CT imaging of the abdomen and pelvis was performed following the standard protocol without IV contrast. COMPARISON:  November 26, 2020 FINDINGS: Lower chest: No acute abnormality. Hepatobiliary: No focal liver abnormality is seen. The gallbladder is contracted and stable in appearance without gallstones or gallbladder wall thickening. Mild, stable, central intrahepatic biliary dilatation is noted. Pancreas: Unremarkable. No pancreatic ductal dilatation or surrounding inflammatory changes. Spleen: Normal in size without focal abnormality. Adrenals/Urinary Tract: Adrenal glands are unremarkable. Kidneys are normal in size, without renal calculi or  hydronephrosis. A stable 2.9 cm diameter cyst is seen within the upper pole of the left kidney. Bladder is unremarkable. Stomach/Bowel: Stomach is within normal limits. The appendix is surgically absent. No evidence of bowel wall thickening, distention, or inflammatory changes. Vascular/Lymphatic: Aortic atherosclerosis. No enlarged abdominal or pelvic lymph nodes. Reproductive: There is moderate to marked severity prostate gland enlargement. Other: A stable 2.7 cm x 1.6 cm fat containing left inguinal hernia is noted. No abdominopelvic ascites. Musculoskeletal: Grade 1 anterolisthesis of the L5 vertebral body is noted on S1 with marked severity degenerative changes again noted at the levels of L4-L5 and L5-S1. IMPRESSION: 1. Prostatomegaly. 2. Stable appearance of the gallbladder with mild, stable central intrahepatic biliary dilatation.  3. Extensive chronic and degenerative changes within the lower lumbar spine. Electronically Signed   By: Virgina Norfolk M.D.   On: 12/27/2020 19:20   DG Abd 1 View  Result Date: 01/08/2021 CLINICAL DATA:  87 year old male with C diff. EXAM: ABDOMEN - 1 VIEW COMPARISON:  Abdominal radiograph dated 01/06/2021. FINDINGS: Diffuse gaseous distension of the colon similar to prior radiograph. No definite small bowel dilatation. A crescentic lucency in the right lower quadrant in the periphery of the bowel wall likely within the bowel lumen. Pneumatosis or a small pneumoperitoneum is not excluded. CT may provide better evaluation if there is clinical concern. Osteopenia with degenerative changes of the spine. No acute osseous pathology. IMPRESSION: 1. Diffuse gaseous distension of the colon similar to prior radiograph. 2. Crescentic lucency along the bowel wall in the right lower quadrant may be within the bowel lumen or represent pneumatosis. Electronically Signed   By: Anner Crete M.D.   On: 01/08/2021 16:44   DG Abd 1 View  Result Date: 12/31/2020 CLINICAL DATA:  Abdominal distension EXAM: ABDOMEN - 1 VIEW COMPARISON:  12/30/2020. FINDINGS: Gaseous distension of the colon is again noted with slight increase distension of the RIGHT colon. Nondistended gas-filled small bowel loops are again noted. No other significant changes noted. IMPRESSION: Gaseous distension of the colon again noted with slight increased distention of the RIGHT colon. Electronically Signed   By: Margarette Canada M.D.   On: 12/31/2020 10:56   DG Chest Port 1 View  Result Date: 01/06/2021 CLINICAL DATA:  Status post right upper extremity PICC placement EXAM: PORTABLE CHEST 1 VIEW COMPARISON:  12/31/2020 FINDINGS: Interval placement of right upper extremity PICC, tip projecting over the lower SVC. Cardiomegaly. No acute abnormality of the lungs. IMPRESSION: 1. Interval placement of right upper extremity PICC, tip projecting over the lower  SVC. 2. Cardiomegaly. Electronically Signed   By: Eddie Candle M.D.   On: 01/06/2021 10:08   DG CHEST PORT 1 VIEW  Result Date: 12/31/2020 CLINICAL DATA:  Wheezing. EXAM: PORTABLE CHEST 1 VIEW COMPARISON:  12/27/2020 and prior studies FINDINGS: Cardiomegaly and mild peribronchial thickening again noted. Mild LEFT basilar atelectasis/scarring again identified. There is no evidence of focal airspace disease, pulmonary edema, suspicious pulmonary nodule/mass, pleural effusion, or pneumothorax. No acute bony abnormalities are identified. IMPRESSION: Cardiomegaly, mild chronic peribronchial thickening and mild LEFT basilar atelectasis/scarring. No evidence of acute cardiopulmonary disease. Electronically Signed   By: Margarette Canada M.D.   On: 12/31/2020 10:54   DG Chest Port 1 View  Result Date: 12/27/2020 CLINICAL DATA:  Fever. EXAM: PORTABLE CHEST 1 VIEW COMPARISON:  November 27, 2020 FINDINGS: There is no evidence of an acute infiltrate, pleural effusion or pneumothorax. The heart size and mediastinal contours are within normal limits. The visualized skeletal structures  are unremarkable. IMPRESSION: No acute cardiopulmonary disease. Electronically Signed   By: Virgina Norfolk M.D.   On: 12/27/2020 19:11   DG ABD ACUTE 2+V W 1V CHEST  Result Date: 01/02/2021 CLINICAL DATA:  87 year old male with distended abdomen. EXAM: DG ABDOMEN ACUTE WITH 1 VIEW CHEST COMPARISON:  Abdominal series 12/30/2020 and earlier, including CT Abdomen and Pelvis 12/27/2020. FINDINGS: Semi upright portable view of the chest at 0816 hours. Mildly lower lung volumes. Stable cardiac size and mediastinal contours. Allowing for portable technique the lungs are clear. No pneumothorax or pneumoperitoneum. No acute osseous abnormality identified. Upright and supine views of the abdomen and pelvis. Increasing gaseous distension of redundant colon, largely new from the CT 12/27/2020. Increased rectal gas now. Gas-filled but nondilated small  bowel loops. No pneumoperitoneum. No acute osseous abnormality identified. IMPRESSION: 1. New and increasing gaseous distension of the colon since the CT 12/27/2020. Appearance favors progressive large bowel ileus. Developing toxic megacolon not excluded. 2. No free air. 3. No acute cardiopulmonary abnormality. Electronically Signed   By: Genevie Ann M.D.   On: 01/02/2021 09:01   DG ABD ACUTE 2+V W 1V CHEST  Result Date: 12/30/2020 CLINICAL DATA:  Abdominal distension. Reported Clostridium difficile colitis EXAM: DG ABDOMEN ACUTE WITH 1 VIEW CHEST COMPARISON:  CT abdomen and pelvis Dec 27, 2020. Chest radiograph Dec 27, 2020 FINDINGS: PA chest: There is mild left base atelectasis. The lungs elsewhere are clear. Heart is mildly enlarged with pulmonary vascularity normal. No appreciable adenopathy. Supine and upright abdomen: There are loops of mildly dilated colon. No air-fluid levels. No appreciable small bowel dilatation. No air-fluid levels. No free air. No abnormal calcifications. IMPRESSION: Question a degree of colonic ileus or colitis. No obstructing focus evident. No free air. Left base atelectasis. Lungs elsewhere clear. Mild cardiac enlargement. Electronically Signed   By: Lowella Grip III M.D.   On: 12/30/2020 09:41   DG Abd Portable 1V  Result Date: 01/06/2021 CLINICAL DATA:  [name] EXAM: PORTABLE ABDOMEN - 1 VIEW COMPARISON:  Portable exam at 0810 hrs compared to 01/05/2021 FINDINGS: Mild diffuse gaseous distention of colon. No bowel wall thickening. Small bowel gas pattern normal. Osseous structures normal. No urinary tract calcifications. IMPRESSION: Mild gaseous distention of colon without definite wall thickening. Electronically Signed   By: Lavonia Dana M.D.   On: 01/06/2021 10:17   DG Abd Portable 1V  Result Date: 01/05/2021 CLINICAL DATA:  Abdominal distension. EXAM: PORTABLE ABDOMEN - 1 VIEW COMPARISON:  Jan 04, 2021. FINDINGS: Stable gaseous distention of the colon is  noted with the proximal transverse colon measuring 14 cm in diameter, which is not changed compared to prior exam. No definite small bowel dilatation is noted. IMPRESSION: Stable gaseous distention of the colon is noted as described above. Electronically Signed   By: Marijo Conception M.D.   On: 01/05/2021 12:54   DG Abd Portable 1V  Result Date: 01/04/2021 CLINICAL DATA:  Toxic megacolon EXAM: PORTABLE ABDOMEN - 1 VIEW COMPARISON:  01/02/2021 FINDINGS: There is marked gaseous distension of the colon again noted. Gaseous distension of the proximal transverse colon and cecum has progressed since prior examination measuring roughly 14 cm in greatest caliber of the a colon within the proximal transverse segment. No pneumatosis or gross free intraperitoneal gas. Visualized lung bases are clear. IMPRESSION: Progressive gaseous distension of the colon with maximal caliber of approximately 14 cm within the proximal transverse segment. No free air. Electronically Signed   By: Fidela Salisbury MD  On: 01/04/2021 05:27   ECHOCARDIOGRAM COMPLETE  Result Date: 12/30/2020    ECHOCARDIOGRAM REPORT   Patient Name:   SAMRAT HAYWARD Date of Exam: 12/30/2020 Medical Rec #:  [id number]        Height:       67.0 in Accession #:    [id number]       Weight:       171.3 lb Date of Birth:  1930-10-22         BSA:          1.893 m Patient Age:    45 years         BP:           160/57 mmHg Patient Gender: M                HR:           110 bpm. Exam Location:  Inpatient Procedure: 2D Echo, Cardiac Doppler and Color Doppler Indications:    Atrial fibrillation  History:        Patient has no prior history of Echocardiogram examinations.                 CAD, COPD, Arrythmias:Atrial Fibrillation,                 Signs/Symptoms:Altered Mental Status and Sepsis; Risk                 Factors:Dyslipidemia and Hypertension.  Sonographer:    Dustin Flock Referring Phys: [id number] [place]  1. Left ventricular ejection  fraction, by estimation, is 55 to 60%. The left ventricle has normal function. The left ventricle has no regional wall motion abnormalities. There is mild concentric left ventricular hypertrophy. Diastolic function indeterminant due to atrial fibrillation.  2. Right ventricular systolic function is normal. The right ventricular size is normal. There is mildly elevated pulmonary artery systolic pressure.  3. Left atrial size was mildly dilated.  4. Right atrial size was mildly dilated.  5. The mitral valve is normal in structure. Trivial mitral valve regurgitation. No evidence of mitral stenosis.  6. The aortic valve is tricuspid. There is mild calcification of the aortic valve. There is mild thickening of the aortic valve. Aortic valve regurgitation is not visualized. Mild aortic valve sclerosis is present, with no evidence of aortic valve stenosis.  7. The inferior vena cava is normal in size with greater than 50% respiratory variability, suggesting right atrial pressure of 3 mmHg. Comparison(s): No prior Echocardiogram. FINDINGS  Left Ventricle: Left ventricular ejection fraction, by estimation, is 55 to 60%. The left ventricle has normal function. The left ventricle has no regional wall motion abnormalities. The left ventricular internal cavity size was normal in size. There is  mild concentric left ventricular hypertrophy. Diastolic function indeterminant due to atrial fibrillation. Right Ventricle: The right ventricular size is normal. Right vetricular wall thickness was not well visualized. Right ventricular systolic function is normal. There is mildly elevated pulmonary artery systolic pressure. The tricuspid regurgitant velocity  is 3.01 m/s, and with an assumed right atrial pressure of 3 mmHg, the estimated right ventricular systolic pressure is 01.7 mmHg. Left Atrium: Left atrial size was mildly dilated. Right Atrium: Right atrial size was mildly dilated. Pericardium: There is no evidence of pericardial  effusion. Mitral Valve: The mitral valve is normal in structure. Trivial mitral valve regurgitation. No evidence of mitral valve stenosis. Tricuspid Valve: The tricuspid valve is normal in structure. Tricuspid valve regurgitation is  trivial. Aortic Valve: The aortic valve is tricuspid. There is mild calcification of the aortic valve. There is mild thickening of the aortic valve. Aortic valve regurgitation is not visualized. Mild aortic valve sclerosis is present, with no evidence of aortic valve stenosis. Pulmonic Valve: The pulmonic valve was not well visualized. Pulmonic valve regurgitation is trivial. Aorta: The aortic root is normal in size and structure. Venous: The inferior vena cava is normal in size with greater than 50% respiratory variability, suggesting right atrial pressure of 3 mmHg. IAS/Shunts: No atrial level shunt detected by color flow Doppler.  LEFT VENTRICLE PLAX 2D LVIDd:         5.40 cm  Diastology LVIDs:         3.70 cm  LV e' medial:    6.31 cm/s LV PW:         1.30 cm  LV E/e' medial:  18.7 LV IVS:        1.30 cm  LV e' lateral:   10.90 cm/s LVOT diam:     2.50 cm  LV E/e' lateral: 10.8 LV SV:         75 LV SV Index:   40 LVOT Area:     4.91 cm  RIGHT VENTRICLE RV Basal diam:  3.00 cm RV S prime:     10.70 cm/s TAPSE (M-mode): 2.1 cm LEFT ATRIUM             Index       RIGHT ATRIUM           Index LA diam:        5.40 cm 2.85 cm/m  RA Area:     24.20 cm LA Vol (A2C):   54.4 ml 28.73 ml/m RA Volume:   76.70 ml  40.51 ml/m LA Vol (A4C):   75.6 ml 39.93 ml/m LA Biplane Vol: 69.7 ml 36.81 ml/m  AORTIC VALVE LVOT Vmax:   86.70 cm/s LVOT Vmean:  59.700 cm/s LVOT VTI:    0.153 m  AORTA Ao Root diam: 3.60 cm MITRAL VALVE                TRICUSPID VALVE MV Area (PHT): 6.02 cm     TR Peak grad:   36.2 mmHg MV Decel Time: 126 msec     TR Vmax:        301.00 cm/s MV E velocity: 118.00 cm/s                             SHUNTS                             Systemic VTI:  0.15 m                              Systemic Diam: 2.50 cm Gwyndolyn Kaufman MD Electronically signed by Gwyndolyn Kaufman MD Signature Date/Time: 12/30/2020/3:00:41 PM    Final    [place] EKG SITE RITE  Result Date: 01/05/2021 If Site Rite image not attached, placement could not be confirmed due to current cardiac rhythm.  US Abdomen Limited RUQ (LIVER/GB)  Result Date: 12/27/2020 CLINICAL DATA:  Severe sepsis. EXAM: ULTRASOUND ABDOMEN LIMITED RIGHT UPPER QUADRANT COMPARISON:  November 26, 2020 FINDINGS: Gallbladder: Shadowing echogenic gallstones are seen within the lumen of a contracted gallbladder. The largest measures approximately 1.0 cm. There is  no evidence of gallbladder wall thickening (2.4 mm). No sonographic Murphy sign noted by sonographer. Common bile duct: Diameter: 6.9 mm Liver: No focal lesion identified. Within normal limits in parenchymal echogenicity. Portal vein is patent on color Doppler imaging with normal direction of blood flow towards the liver. Other: None. IMPRESSION: Cholelithiasis without evidence of acute cholecystitis. Electronically Signed   By: Virgina Norfolk M.D.   On: 12/27/2020 22:46   (Echo, Carotid, EGD, Colonoscopy, ERCP)    Subjective:   Discharge Exam: Vitals:   01/23/21 0509 01/23/21 0832  BP: 130/62   Pulse: 68 93  Resp: 20 18  Temp: 98.8 F (37.1 C)   SpO2: 93% 95%   Vitals:   01/22/21 2039 01/22/21 2141 01/23/21 0509 01/23/21 0832  BP:  (!) 149/71 130/62   Pulse:  85 68 93  Resp:   20 18  Temp:   98.8 F (37.1 C)   TempSrc:   Oral   SpO2: 99%  93% 95%  Weight:      Height:        General: Pt is alert, awake, not in acute distress Cardiovascular: RRR, S1/S2 +, no rubs, no gallops Respiratory: CTA bilaterally, no wheezing, no rhonchi Abdominal: Soft, NT, ND, bowel sounds + Extremities: no edema, no cyanosis    The results of significant diagnostics from this hospitalization (including imaging, microbiology, ancillary and laboratory) are listed below for reference.      Microbiology: No results found for this or any previous visit (from the past 240 hour(s)).   Labs: BNP (last 3 results) Recent Labs    12/27/20 1754  BNP 540.0*   Basic Metabolic Panel: Recent Labs  Lab 01/17/21 0252 01/18/21 0424 01/19/21 0342 01/20/21 0328 01/21/21 0330 01/22/21 0352 01/23/21 0757  NA 142   < > 142 142 143 142 143  K 3.5   < > 2.9* 3.5 3.4* 3.0* 3.2*  CL 112*   < > 115* 115* 114* 110 112*  CO2 23   < > 23 24 23 23 24   GLUCOSE 100*   < > 145* 96 91 111* 96  BUN 11   < > 7* 7* 7* 6* 6*  CREATININE 0.87   < > 0.90 0.93 0.89 0.82 0.85  CALCIUM 8.2*   < > 7.8* 8.0* 8.2* 8.3* 8.3*  MG 2.0  --   --  1.5* 2.1 1.6* 2.0  PHOS  --   --   --  2.5  --   --   --    < > = values in this interval not displayed.   Liver Function Tests: Recent Labs  Lab 01/20/21 0328  ALBUMIN 2.4*   No results for input(s): LIPASE, AMYLASE in the last 168 hours. No results for input(s): AMMONIA in the last 168 hours. CBC: Recent Labs  Lab 01/18/21 0424 01/19/21 0342 01/20/21 0328 01/21/21 0330  WBC 6.3 5.2 4.3 3.9*  NEUTROABS 4.3  --   --   --   HGB 9.2* 8.7* 9.2* 9.3*  HCT 29.7* 28.1* 29.1* 30.1*  MCV 94.0 91.8 93.0 92.3  PLT 246 219 206 227   Cardiac Enzymes: No results for input(s): CKTOTAL, CKMB, CKMBINDEX, TROPONINI in the last 168 hours. BNP: Invalid input(s): POCBNP CBG: Recent Labs  Lab 01/19/21 2116  GLUCAP 96   D-Dimer No results for input(s): DDIMER in the last 72 hours. Hgb A1c No results for input(s): HGBA1C in the last 72 hours. Lipid Profile No results for input(s): CHOL, HDL,  LDLCALC, TRIG, CHOLHDL, LDLDIRECT in the last 72 hours. Thyroid function studies No results for input(s): TSH, T4TOTAL, T3FREE, THYROIDAB in the last 72 hours.  Invalid input(s): FREET3 Anemia work up No results for input(s): VITAMINB12, FOLATE, FERRITIN, TIBC, IRON, RETICCTPCT in the last 72 hours. Urinalysis    Component Value Date/Time   COLORURINE AMBER (A)  12/27/2020 2214   APPEARANCEUR HAZY (A) 12/27/2020 2214   APPEARANCEUR Clear 04/29/2013 1543   LABSPEC 1.039 (H) 12/27/2020 2214   LABSPEC 1.020 04/29/2013 1543   PHURINE 5.0 12/27/2020 2214   GLUCOSEU NEGATIVE 12/27/2020 2214   GLUCOSEU Negative 04/29/2013 1543   HGBUR SMALL (A) 12/27/2020 2214   BILIRUBINUR NEGATIVE 12/27/2020 2214   BILIRUBINUR + 07/25/2018 1304   BILIRUBINUR Negative 04/29/2013 1543   KETONESUR 5 (A) 12/27/2020 2214   PROTEINUR >=300 (A) 12/27/2020 2214   UROBILINOGEN 0.2 07/25/2018 1304   UROBILINOGEN 0.2 03/08/2012 1624   NITRITE NEGATIVE 12/27/2020 2214   LEUKOCYTESUR NEGATIVE 12/27/2020 2214   LEUKOCYTESUR Negative 04/29/2013 1543   Sepsis Labs Invalid input(s): PROCALCITONIN,  WBC,  LACTICIDVEN Microbiology No results found for this or any previous visit (from the past 240 hour(s)).   Time coordinating discharge:  38 minutes  SIGNED:   Georgette Shell, MD  [facility] 01/23/2021, 12:12 PM

## 2021-01-23 NOTE — Progress Notes (Signed)
[facility] [address] [employer] [facility]) Hospital Liaison Note   Notified by Christianne Dolin of patient/family request for [facility] palliative services at home after discharge.   [facility] hospital liaison will follow patient for discharge disposition.   Please call with any outpatient palliative care questions or concerns.   Thank you for the opportunity to participate in this patient's care.  Lorelee Market, LPN [facility] Liaison [phone]

## 2021-01-23 NOTE — TOC Progression Note (Signed)
Transition of Care [facility]) - Progression Note    Patient Details  Name: Leon Estes MRN: [id number] Date of Birth: 07-Aug-1931  [facility]) CM/SW Contact  Deshon Koslowski, Juliann Pulse, RN Phone Number: 01/23/2021, 10:10 AM  Clinical Narrative:  spoke to spouse [place] d/c plan still declines SNF-want home w/HHC-[facility] HHPT/OT/csw;[employer] has already delivered 3n1,shower stool to rm(reminded spouse to take dme @ d/c home-voiced understanding. Family will transport home ow own.     Expected Discharge Plan: [place] Barriers to Discharge: Continued Medical Work up  Expected Discharge Plan and Services Expected Discharge Plan: [place]   Discharge Planning Services: CM Consult Post Acute Care Choice: Durable Medical Equipment, Home Health Living arrangements for the past 2 months: Single Family Home                 DME Arranged: 3-N-1, Shower stool DME Agency: [employer] Date DME Agency Contacted: 01/10/21 Time DME Agency Contacted: [phone] Representative spoke with at DME Agency: [place] ([place]) Interventions    Readmission Risk Interventions No flowsheet data found.

## 2021-01-24 NOTE — Telephone Encounter (Signed)
Transition Care Management Unsuccessful Follow-up Telephone Call  Date of discharge and from where:  01/23/2021, [place]  Attempts:  2nd Attempt  Reason for unsuccessful TCM follow-up call:  Unable to reach patient rings twice and says, "your call cannot be completed as dialed, check your directory and call again". May be an invalid number.

## 2021-01-24 NOTE — Telephone Encounter (Signed)
Transition Care Management Unsuccessful Follow-up Telephone Call  Date of discharge and from where:  01/23/2021, [place]  Attempts:  3rd Attempt  Reason for unsuccessful TCM follow-up call:  Unable to reach patient, rings twice and says, "your call cannot be completed as dialed, check your directory and call again". May be an invalid number.

## 2021-01-25 NOTE — Telephone Encounter (Signed)
Spoke to pt's wife, Leon Estes. She said his biggest issue right now is swelling in his legs and feet. She said he is too weak to be able to come for an office visit.

## 2021-01-25 NOTE — Telephone Encounter (Signed)
Please see if you can get a hold of his wife, check on his status and set up a follow up visit

## 2021-01-26 ENCOUNTER — Telehealth: Payer: Self-pay

## 2021-01-26 NOTE — Telephone Encounter (Signed)
Leon Estes with [facility] left message on triage asking for approval for pt to get palliative care services at home. Hospital had requested when he d/c. Leave orders at [phone] opt 2.

## 2021-01-26 NOTE — Telephone Encounter (Signed)
Spoke to [place]. Gave verbal orders.

## 2021-01-26 NOTE — Telephone Encounter (Signed)
Spoke to pt's wife about possibly being on home visit rotation starting in August. She said she is hoping he will be better by then and will not need to do that. Palliative care will keep [place] updated and Dr Silvio Pate will evaluate in August if he needs a home visit.

## 2021-01-27 ENCOUNTER — Telehealth: Payer: Self-pay | Admitting: Nurse Practitioner

## 2021-01-27 NOTE — Telephone Encounter (Signed)
Spoke with patient's wife Wilburn Cornelia, regarding the Palliative referral/services and all questions were answered and she was in agreement with starting services with [place].  I have scheduled an In-home Consult for 02/14/21 @ 9 AM.

## 2021-01-30 NOTE — Telephone Encounter (Signed)
Spoke to wife She feels he is doing well--other than the swelling in his feet (also had 1 arm swollen but this is better). Is better in the morning She has been giving him salt--discussed holding off on that No SOB  Discussed that this could partially be due to malnutrition and also the salt Will continue elevation and hold off on salt No duiretics given his weakened state and no SOB (especially if malnutrition is a sig part of this)  Will try to get out to do home visit as soon as possible.

## 2021-01-30 NOTE — Telephone Encounter (Signed)
Pt's wife called back to say he is still having a lot of swelling in his legs and feet. She has him in a recliner and then pillows under his feet and legs. Palliative care s not coming out until 02-14-21 for evaluation. Asking what can be done for the swelling.

## 2021-02-08 ENCOUNTER — Encounter: Payer: Self-pay | Admitting: Internal Medicine

## 2021-02-08 ENCOUNTER — Ambulatory Visit: Payer: PPO | Admitting: Internal Medicine

## 2021-02-08 ENCOUNTER — Other Ambulatory Visit: Payer: Self-pay

## 2021-02-08 VITALS — BP 134/62 | HR 90 | Resp 24 | Wt 154.0 lb

## 2021-02-08 DIAGNOSIS — I10 Essential (primary) hypertension: Secondary | ICD-10-CM

## 2021-02-08 DIAGNOSIS — K5931 Toxic megacolon: Secondary | ICD-10-CM | POA: Diagnosis not present

## 2021-02-08 DIAGNOSIS — J449 Chronic obstructive pulmonary disease, unspecified: Secondary | ICD-10-CM

## 2021-02-08 DIAGNOSIS — R410 Disorientation, unspecified: Secondary | ICD-10-CM | POA: Insufficient documentation

## 2021-02-08 DIAGNOSIS — N401 Enlarged prostate with lower urinary tract symptoms: Secondary | ICD-10-CM

## 2021-02-08 NOTE — Assessment & Plan Note (Signed)
Much better Now eating--bowels regular On weaning vanco---if recurs will try extending Rx

## 2021-02-08 NOTE — Assessment & Plan Note (Signed)
BP Readings from Last 3 Encounters:  02/08/21 134/62  01/23/21 130/62  12/09/20 126/60   Okay on metoprolol

## 2021-02-08 NOTE — Assessment & Plan Note (Signed)
Doing okay on the symbicort

## 2021-02-08 NOTE — Assessment & Plan Note (Signed)
Severe in the hospital but resolved now

## 2021-02-08 NOTE — Assessment & Plan Note (Signed)
Voiding okay on tamsulosin

## 2021-02-08 NOTE — Progress Notes (Signed)
Subjective:    Patient ID: Leon Estes, male    DOB: 01/23/1931, 87 y.o.   MRN: [id number]  HPI Home visit for hospital follow up Wife and daughter are here Reviewed hospital records  Hard time in the hospital Worsened abdominal pain and fever Turned out not to be repeated cholecystitis---he had C dif and toxic megacolon Treated with vancomycin and IV flagyl Now home on tapering vanco--just down to twice a day now  Severe delirium----he doesn't even remember any of it  Home for ~2 weeks Couldn't even walk at first Was not eating much in hospital--but getting back to normal for him Walking with walker now Wife is bathing him Dresses himself--even shoes/ Using commode --hard for him to get off seat without the arms No incontinence Bowels moving daily No abdominal pain---just "a gripe" once in a while---and that resolves with moving bowels. No blood  Breathing is okay Still with DOE if he pushes No cough,  fever  Has lost 6-10# from his usual  Current Outpatient Medications on File Prior to Visit  Medication Sig Dispense Refill   aspirin EC 81 MG tablet Take 81 mg by mouth daily.     budesonide-formoterol (SYMBICORT) 160-4.5 MCG/ACT inhaler INHALE 2 PUFFS INTO THE LUNGS TWICE DAILY 10.2 g 5   gabapentin (NEURONTIN) 300 MG capsule Take 300 mg by mouth at bedtime.     metoprolol succinate (TOPROL-XL) 25 MG 24 hr tablet Take 1 tablet (25 mg total) by mouth daily. 30 tablet 11   Multiple Vitamin (MULTIVITAMIN WITH MINERALS) TABS tablet Take 1 tablet by mouth daily.     pantoprazole (PROTONIX) 40 MG tablet TAKE 1 TABLET BY MOUTH ONCE A DAY 90 tablet 3   potassium chloride (KLOR-CON) 10 MEQ tablet Take 4 tablets (40 mEq total) by mouth daily. 4 tablet 0   simvastatin (ZOCOR) 40 MG tablet Take 40 mg by mouth every evening.     tamsulosin (FLOMAX) 0.4 MG CAPS capsule TAKE 1 CAPSULE BY MOUTH ONCE DAILY 90 capsule 3   traMADol (ULTRAM) 50 MG tablet Take 50 mg by mouth 2 (two)  times daily as needed for moderate pain.     vancomycin (VANCOCIN) 125 MG capsule Take 1 capsule (125 mg total) by mouth 4 (four) times daily. For 7 days, then take 1 capsule by mouth 3 times daily for 7 days, then take 1 capsule by mouth 2 times daily for 7 days and then take 1 capsule by mouth once daily for 7 days. 70 capsule 0   No current facility-administered medications on file prior to visit.    Allergies  Allergen Reactions   Sulfa Antibiotics Rash   Benzalkonium Chloride     "Benzalkonium chloride is a quaternary ammonium antiseptic and disinfectant with actions and uses similar to those of other cationic surfactants. It is also used as an antimicrobial preservative for pharmaceutical products."   Neosporin [Neomycin-Bacitracin Zn-Polymyx]    Augmentin [Amoxicillin-Pot Clavulanate] Rash   Terbinafine Rash   Terbinafine And Related Rash    Past Medical History:  Diagnosis Date   Asthma    BPH (benign prostatic hypertrophy)    Cataract    Chronic renal disease, stage III (HCC)    Chronic sinusitis    COPD (chronic obstructive pulmonary disease) ([place])    Coronary artery disease    2 stents   Gall stones    Hearing loss    DOES NOT WEAR HEARING AIDS   High cholesterol    Hypertension  Macular degeneration    right eye    Past Surgical History:  Procedure Laterality Date   APPENDECTOMY     CORONARY ANGIOPLASTY WITH STENT PLACEMENT  1998   2 stents   TRANSURETHRAL RESECTION OF PROSTATE      Family History  Problem Relation Age of Onset   Diabetes Father    Heart disease Brother     Social History   Socioeconomic History   Marital status: Married    Spouse name: Not on file   Number of children: 4   Years of education: Not on file   Highest education level: Not on file  Occupational History   Occupation: Drove truck and concrete work  Tobacco Use   Smoking status: Former    Pack years: 0.00    Types: Cigarettes    Quit date: 08/13/2005    Years  since quitting: 15.5   Smokeless tobacco: Current    Types: Chew   Tobacco comments:    discussed stopping this (only once in a while)  Vaping Use   Vaping Use: Never used  Substance and Sexual Activity   Alcohol use: No   Drug use: No   Sexual activity: Not on file  Other Topics Concern   Not on file  Social History Narrative   No living will   Wife, then [place] daughter should make decisions   Would accept resuscitation   Would probably accept tube feeds   Social Determinants of Health   Financial Resource Strain: Not on file  Food Insecurity: Not on file  Transportation Needs: Not on file  Physical Activity: Not on file  Stress: Not on file  Social Connections: Not on file  Intimate Partner Violence: Not on file   Review of Systems Still with foot swelling Voiding okay Had PICC line in right arm--still giving him some pain (though better) Right hand was swollen at first upon coming home--better now Sleeps okay     Objective:   Physical Exam Constitutional:      Comments: Mild wasting  Cardiovascular:     Rate and Rhythm: Normal rate and regular rhythm.     Heart sounds: No murmur heard.   No gallop.     Comments: Occ skips Pulmonary:     Effort: Pulmonary effort is normal.     Breath sounds: No wheezing or rales.     Comments: Decreased breath sounds but clear Abdominal:     General: Bowel sounds are normal.     Palpations: Abdomen is soft.     Tenderness: There is no abdominal tenderness. There is no guarding or rebound.  Musculoskeletal:     Cervical back: Neck supple.     Comments: 1+ pitting edema around ankles  (better in the morning)  Lymphadenopathy:     Cervical: No cervical adenopathy.  Neurological:     Mental Status: He is alert.  Psychiatric:        Mood and Affect: Mood normal.        Behavior: Behavior normal.           Assessment & Plan:

## 2021-02-14 ENCOUNTER — Other Ambulatory Visit: Payer: PPO | Admitting: Nurse Practitioner

## 2021-02-14 ENCOUNTER — Other Ambulatory Visit: Payer: Self-pay

## 2021-02-16 ENCOUNTER — Telehealth: Payer: Self-pay | Admitting: Nurse Practitioner

## 2021-02-16 NOTE — Telephone Encounter (Signed)
Wife had left message about cancelling the Palliative Consult on 02/14/21.  Returned call to wife to see if they wanted to reschedule and wife stated that patient did not want to reschedule visit, patient has declined Palliative services at this time. Will notify referring MD and Palliative Team.

## 2021-03-06 ENCOUNTER — Telehealth: Payer: Self-pay

## 2021-03-06 MED ORDER — VANCOMYCIN HCL 125 MG PO CAPS: 125.0000 mg | ORAL_CAPSULE | Freq: Four times a day (QID) | ORAL | 0 refills | Status: DC

## 2021-03-06 NOTE — Telephone Encounter (Signed)
After rev last note from Dr Silvio Pate I went ahead and sent in the taper again  Please plan f/u with Dr Silvio Pate  Will cc him   Please check in with pt tomorrow

## 2021-03-06 NOTE — Telephone Encounter (Addendum)
Pt's wife LVM wanting to talk about pt not feeling well. Requesting abx be refilled.

## 2021-03-06 NOTE — Telephone Encounter (Signed)
Spoke to patient's wife and was advised that her husband had a fever and chills Friday along with diarrhea. Patient's wife stated that he spent a month in the hospital with C-Diff. Patient's wife stated that he was on Vancomycin and finished that last week. Patient's wife stated that she was wondering if he needs a refill on the Vancomycin. Patient's wife stated that he does not have a fever today and has only had diarrhea two times today. Patients wife stated that her husband seems to be feeling a little better today. Patient's wife stated that she does not want him to end up back in the hospital again. Patient's wife was advised that Dr. Silvio Pate is out of the office this week but will send this message to another provider for them to review and advise. Patient's wife was given ER precautions and she verbalized understanding. Pharmacy [facility]

## 2021-03-07 NOTE — Telephone Encounter (Signed)
Aware FYI to pcp

## 2021-03-07 NOTE — Telephone Encounter (Signed)
Wife notified of Dr. Marliss Coots comments. Pt already had a f/u appt scheduled for 03/14/21 with PCP. Wife said pt is acting fine and he has no fever this moring he is still just using the bathroom often, he has already had diarrhea this morning. They will start med asap and keep [place] posted

## 2021-03-14 ENCOUNTER — Ambulatory Visit (INDEPENDENT_AMBULATORY_CARE_PROVIDER_SITE_OTHER): Payer: PPO | Admitting: Internal Medicine

## 2021-03-14 ENCOUNTER — Encounter: Payer: Self-pay | Admitting: Internal Medicine

## 2021-03-14 ENCOUNTER — Other Ambulatory Visit: Payer: Self-pay

## 2021-03-14 VITALS — BP 124/64 | HR 64 | Temp 98.2°F | Ht 67.0 in | Wt 142.0 lb

## 2021-03-14 DIAGNOSIS — A0471 Enterocolitis due to Clostridium difficile, recurrent: Secondary | ICD-10-CM

## 2021-03-14 DIAGNOSIS — R410 Disorientation, unspecified: Secondary | ICD-10-CM | POA: Diagnosis not present

## 2021-03-14 DIAGNOSIS — I10 Essential (primary) hypertension: Secondary | ICD-10-CM | POA: Diagnosis not present

## 2021-03-14 DIAGNOSIS — J449 Chronic obstructive pulmonary disease, unspecified: Secondary | ICD-10-CM | POA: Diagnosis not present

## 2021-03-14 LAB — CBC
HCT: 33.2 % — ABNORMAL LOW (ref 39.0–52.0)
Hemoglobin: 10.4 g/dL — ABNORMAL LOW (ref 13.0–17.0)
MCHC: 31.3 g/dL (ref 30.0–36.0)
MCV: 83.8 fl (ref 78.0–100.0)
Platelets: 270 10*3/uL (ref 150.0–400.0)
RBC: 3.97 Mil/uL — ABNORMAL LOW (ref 4.22–5.81)
RDW: 18.6 % — ABNORMAL HIGH (ref 11.5–15.5)
WBC: 16.2 10*3/uL — ABNORMAL HIGH (ref 4.0–10.5)

## 2021-03-14 LAB — RENAL FUNCTION PANEL
Albumin: 2.8 g/dL — ABNORMAL LOW (ref 3.5–5.2)
BUN: 11 mg/dL (ref 6–23)
CO2: 32 mEq/L (ref 19–32)
Calcium: 8.1 mg/dL — ABNORMAL LOW (ref 8.4–10.5)
Chloride: 103 mEq/L (ref 96–112)
Creatinine, Ser: 0.95 mg/dL (ref 0.40–1.50)
GFR: 71.73 mL/min (ref >60.00)
Glucose, Bld: 108 mg/dL — ABNORMAL HIGH (ref 70–99)
Phosphorus: 1.9 mg/dL — ABNORMAL LOW (ref 2.3–4.6)
Potassium: 3 mEq/L — ABNORMAL LOW (ref 3.5–5.1)
Sodium: 141 mEq/L (ref 135–145)

## 2021-03-14 LAB — HEPATIC FUNCTION PANEL
ALT: 16 U/L (ref 0–53)
AST: 23 U/L (ref 0–37)
Albumin: 2.8 g/dL — ABNORMAL LOW (ref 3.5–5.2)
Alkaline Phosphatase: 85 U/L (ref 39–117)
Bilirubin, Direct: 0.1 mg/dL (ref 0.0–0.3)
Total Bilirubin: 0.4 mg/dL (ref 0.2–1.2)
Total Protein: 6.1 g/dL (ref 6.0–8.3)

## 2021-03-14 NOTE — Progress Notes (Signed)
Subjective:    Patient ID: Leon Estes, male    DOB: 07-11-1931, 87 y.o.   MRN: [id number]  HPI Here with wife for follow up This visit occurred during the SARS-CoV-2 public health emergency.  Safety protocols were in place, including screening questions prior to the visit, additional usage of staff PPE, and extensive cleaning of exam room while observing appropriate contact time as indicated for disinfecting solutions.   Feels better than he has in a while Had some loose stools about 11 days ago---temp elevated and some chills Then restless a few days ago--multiple falls Now back on the vancomycin after that---no diarrhea for the past week now  Breathing is okay Still using inhaler  Taking the metoprolol--but still off the lisinopril/HCTZ No chest pain No dizziness  Current Outpatient Medications on File Prior to Visit  Medication Sig Dispense Refill   aspirin EC 81 MG tablet Take 81 mg by mouth daily.     budesonide-formoterol (SYMBICORT) 160-4.5 MCG/ACT inhaler INHALE 2 PUFFS INTO THE LUNGS TWICE DAILY 10.2 g 5   gabapentin (NEURONTIN) 300 MG capsule Take 300 mg by mouth at bedtime.     Multiple Vitamin (MULTIVITAMIN WITH MINERALS) TABS tablet Take 1 tablet by mouth daily.     pantoprazole (PROTONIX) 40 MG tablet TAKE 1 TABLET BY MOUTH ONCE A DAY 90 tablet 3   simvastatin (ZOCOR) 40 MG tablet Take 40 mg by mouth every evening.     tamsulosin (FLOMAX) 0.4 MG CAPS capsule TAKE 1 CAPSULE BY MOUTH ONCE DAILY 90 capsule 3   traMADol (ULTRAM) 50 MG tablet Take 50 mg by mouth 2 (two) times daily as needed for moderate pain.     vancomycin (VANCOCIN) 125 MG capsule Take 1 capsule (125 mg total) by mouth 4 (four) times daily. For 7 days, then take 1 capsule by mouth 3 times daily for 7 days, then take 1 capsule by mouth 2 times daily for 7 days and then take 1 capsule by mouth once daily for 7 days. 70 capsule 0   No current facility-administered medications on file prior to visit.     Allergies  Allergen Reactions   Sulfa Antibiotics Rash   Benzalkonium Chloride     "Benzalkonium chloride is a quaternary ammonium antiseptic and disinfectant with actions and uses similar to those of other cationic surfactants. It is also used as an antimicrobial preservative for pharmaceutical products."   Neosporin [Neomycin-Bacitracin Zn-Polymyx]    Augmentin [Amoxicillin-Pot Clavulanate] Rash   Terbinafine Rash   Terbinafine And Related Rash    Past Medical History:  Diagnosis Date   Asthma    BPH (benign prostatic hypertrophy)    Cataract    Chronic renal disease, stage III (HCC)    Chronic sinusitis    COPD (chronic obstructive pulmonary disease) (HCC)    Coronary artery disease    2 stents   Gall stones    Hearing loss    DOES NOT WEAR HEARING AIDS   High cholesterol    Hypertension    Macular degeneration    right eye    Past Surgical History:  Procedure Laterality Date   APPENDECTOMY     CORONARY ANGIOPLASTY WITH STENT PLACEMENT  1998   2 stents   TRANSURETHRAL RESECTION OF PROSTATE      Family History  Problem Relation Age of Onset   Diabetes Father    Heart disease Brother     Social History   Socioeconomic History   Marital status: Married  Spouse name: Not on file   Number of children: 4   Years of education: Not on file   Highest education level: Not on file  Occupational History   Occupation: Drove truck and concrete work  Tobacco Use   Smoking status: Former    Types: Cigarettes    Quit date: 08/13/2005    Years since quitting: 15.5   Smokeless tobacco: Current    Types: Chew   Tobacco comments:    discussed stopping this (only once in a while)  Vaping Use   Vaping Use: Never used  Substance and Sexual Activity   Alcohol use: No   Drug use: No   Sexual activity: Not on file  Other Topics Concern   Not on file  Social History Narrative   No living will   Wife, then [place] daughter should make decisions   Would accept  resuscitation   Would probably accept tube feeds   Social Determinants of Health   Financial Resource Strain: Not on file  Food Insecurity: Not on file  Transportation Needs: Not on file  Physical Activity: Not on file  Stress: Not on file  Social Connections: Not on file  Intimate Partner Violence: Not on file   *Review of Systems Eating some better Lost a lot of weight in the hospital--unclear if he has gained or lost in the past month (they aren't sure about their scale)    Objective:   Physical Exam Constitutional:      Appearance: Normal appearance.  Cardiovascular:     Rate and Rhythm: Normal rate and regular rhythm.     Heart sounds: No murmur heard.   No gallop.  Pulmonary:     Effort: Pulmonary effort is normal.     Comments: Decreased breath sounds but clear Abdominal:     General: Bowel sounds are normal. There is no distension.     Palpations: Abdomen is soft.     Tenderness: There is no abdominal tenderness.  Musculoskeletal:     Cervical back: Neck supple.     Right lower leg: No edema.     Left lower leg: No edema.  Lymphadenopathy:     Cervical: No cervical adenopathy.  Neurological:     Mental Status: He is alert.     Comments: Some instability walking           Assessment & Plan:

## 2021-03-14 NOTE — Assessment & Plan Note (Signed)
Symptoms gone again with restarting vancomycin Will change to fidaxomicin if recurs

## 2021-03-14 NOTE — Assessment & Plan Note (Signed)
Doing fine on the current inhaler Rx

## 2021-03-14 NOTE — Assessment & Plan Note (Signed)
BP Readings from Last 3 Encounters:  03/14/21 124/64  02/08/21 134/62  01/23/21 130/62   Good control with just metoprolol now

## 2021-03-14 NOTE — Assessment & Plan Note (Signed)
Has had some mild confusion (like when sick again)--but no clear delirium recurrence

## 2021-03-15 ENCOUNTER — Other Ambulatory Visit: Payer: Self-pay

## 2021-03-15 MED ORDER — POTASSIUM CHLORIDE ER 10 MEQ PO TBCR: 10.0000 meq | EXTENDED_RELEASE_TABLET | Freq: Every day | ORAL | 3 refills | Status: DC

## 2021-03-20 ENCOUNTER — Encounter: Payer: Self-pay | Admitting: Internal Medicine

## 2021-03-20 ENCOUNTER — Other Ambulatory Visit: Payer: Self-pay

## 2021-03-20 ENCOUNTER — Ambulatory Visit (INDEPENDENT_AMBULATORY_CARE_PROVIDER_SITE_OTHER): Payer: PPO | Admitting: Internal Medicine

## 2021-03-20 VITALS — BP 108/58 | HR 104 | Temp 97.9°F | Ht 67.0 in | Wt 145.0 lb

## 2021-03-20 DIAGNOSIS — M7989 Other specified soft tissue disorders: Secondary | ICD-10-CM | POA: Insufficient documentation

## 2021-03-20 MED ORDER — CEFTRIAXONE SODIUM 1 G IJ SOLR
1.0000 g | Freq: Once | INTRAMUSCULAR | Status: AC
  Administered 2021-03-20: 1 g via INTRAMUSCULAR

## 2021-03-20 NOTE — Progress Notes (Signed)
Subjective:    Patient ID: Leon Estes, male    DOB: 1931/03/02, 87 y.o.   MRN: [id number]  HPI Here with wife due to worsening of right hand and arm swelling This visit occurred during the SARS-CoV-2 public health emergency.  Safety protocols were in place, including screening questions prior to the visit, additional usage of staff PPE, and extensive cleaning of exam room while observing appropriate contact time as indicated for disinfecting solutions.   Had mild swelling in right arm since the hospital Did have ?PICC on that side in hospital Not that bad last week  Markedly worsened yesterday Had temperature and some chills Not particularly painful  Current Outpatient Medications on File Prior to Visit  Medication Sig Dispense Refill   aspirin EC 81 MG tablet Take 81 mg by mouth daily.     budesonide-formoterol (SYMBICORT) 160-4.5 MCG/ACT inhaler INHALE 2 PUFFS INTO THE LUNGS TWICE DAILY 10.2 g 5   gabapentin (NEURONTIN) 300 MG capsule Take 300 mg by mouth at bedtime.     metoprolol succinate (TOPROL-XL) 25 MG 24 hr tablet Take 25 mg by mouth daily.     Multiple Vitamin (MULTIVITAMIN WITH MINERALS) TABS tablet Take 1 tablet by mouth daily.     pantoprazole (PROTONIX) 40 MG tablet TAKE 1 TABLET BY MOUTH ONCE A DAY 90 tablet 3   potassium chloride (KLOR-CON) 10 MEQ tablet Take 1 tablet (10 mEq total) by mouth daily. 90 tablet 3   simvastatin (ZOCOR) 40 MG tablet Take 40 mg by mouth every evening.     tamsulosin (FLOMAX) 0.4 MG CAPS capsule TAKE 1 CAPSULE BY MOUTH ONCE DAILY 90 capsule 3   traMADol (ULTRAM) 50 MG tablet Take 50 mg by mouth 2 (two) times daily as needed for moderate pain.     vancomycin (VANCOCIN) 125 MG capsule Take 1 capsule (125 mg total) by mouth 4 (four) times daily. For 7 days, then take 1 capsule by mouth 3 times daily for 7 days, then take 1 capsule by mouth 2 times daily for 7 days and then take 1 capsule by mouth once daily for 7 days. 70 capsule 0   No  current facility-administered medications on file prior to visit.    Allergies  Allergen Reactions   Sulfa Antibiotics Rash   Benzalkonium Chloride     "Benzalkonium chloride is a quaternary ammonium antiseptic and disinfectant with actions and uses similar to those of other cationic surfactants. It is also used as an antimicrobial preservative for pharmaceutical products."   Neosporin [Neomycin-Bacitracin Zn-Polymyx]    Augmentin [Amoxicillin-Pot Clavulanate] Rash   Terbinafine Rash   Terbinafine And Related Rash    Past Medical History:  Diagnosis Date   Asthma    BPH (benign prostatic hypertrophy)    Cataract    Chronic renal disease, stage III (HCC)    Chronic sinusitis    COPD (chronic obstructive pulmonary disease) (HCC)    Coronary artery disease    2 stents   Gall stones    Hearing loss    DOES NOT WEAR HEARING AIDS   High cholesterol    Hypertension    Macular degeneration    right eye    Past Surgical History:  Procedure Laterality Date   APPENDECTOMY     CORONARY ANGIOPLASTY WITH STENT PLACEMENT  1998   2 stents   TRANSURETHRAL RESECTION OF PROSTATE      Family History  Problem Relation Age of Onset   Diabetes Father  Heart disease Brother     Social History   Socioeconomic History   Marital status: Married    Spouse name: Not on file   Number of children: 4   Years of education: Not on file   Highest education level: Not on file  Occupational History   Occupation: Drove truck and concrete work  Tobacco Use   Smoking status: Former    Types: Cigarettes    Quit date: 08/13/2005    Years since quitting: 15.6   Smokeless tobacco: Current    Types: Chew   Tobacco comments:    discussed stopping this (only once in a while)  Vaping Use   Vaping Use: Never used  Substance and Sexual Activity   Alcohol use: No   Drug use: No   Sexual activity: Not on file  Other Topics Concern   Not on file  Social History Narrative   No living will    Wife, then [place] daughter should make decisions   Would accept resuscitation   Would probably accept tube feeds   Social Determinants of Health   Financial Resource Strain: Not on file  Food Insecurity: Not on file  Transportation Needs: Not on file  Physical Activity: Not on file  Stress: Not on file  Social Connections: Not on file  Intimate Partner Violence: Not on file   Review of Systems No Rx other than ice No recurrence of diarrhea     Objective:   Physical Exam Musculoskeletal:     Comments: Marked swelling in right arm from elbow down Warm and slight tenderness (red) Some induration under right elbow           Assessment & Plan:

## 2021-03-20 NOTE — Assessment & Plan Note (Signed)
Clearly seems to have some degree of cellulitis I am also concerned about a DVT Will give rocephin here Set up duplex ASAP

## 2021-03-20 NOTE — Addendum Note (Signed)
Addended by: Pilar Grammes on: 03/20/2021 04:32 PM   Modules accepted: Orders

## 2021-03-21 ENCOUNTER — Ambulatory Visit
Admission: RE | Admit: 2021-03-21 | Discharge: 2021-03-21 | Disposition: A | Discharge status: Home / Self Care | Payer: PPO | Source: Ambulatory Visit | Attending: Internal Medicine | Admitting: Internal Medicine

## 2021-03-21 ENCOUNTER — Encounter: Payer: Self-pay | Admitting: Internal Medicine

## 2021-03-21 ENCOUNTER — Telehealth: Payer: Self-pay

## 2021-03-21 ENCOUNTER — Ambulatory Visit (INDEPENDENT_AMBULATORY_CARE_PROVIDER_SITE_OTHER): Payer: PPO | Admitting: Internal Medicine

## 2021-03-21 DIAGNOSIS — L03113 Cellulitis of right upper limb: Secondary | ICD-10-CM | POA: Insufficient documentation

## 2021-03-21 DIAGNOSIS — M7989 Other specified soft tissue disorders: Secondary | ICD-10-CM | POA: Diagnosis not present

## 2021-03-21 MED ORDER — CEFTRIAXONE SODIUM 1 G IJ SOLR
1.0000 g | Freq: Once | INTRAMUSCULAR | Status: AC
  Administered 2021-03-21: 1 g via INTRAMUSCULAR

## 2021-03-21 NOTE — Progress Notes (Signed)
Subjective:    Patient ID: Leon Estes, male    DOB: 1931-01-04, 87 y.o.   MRN: [id number]  HPI Here for follow up of cellulitis of the right arm This visit occurred during the SARS-CoV-2 public health emergency.  Safety protocols were in place, including screening questions prior to the visit, additional usage of staff PPE, and extensive cleaning of exam room while observing appropriate contact time as indicated for disinfecting solutions.   He notes some improvement in his hand--less swollen Still with redness and warmth  Current Outpatient Medications on File Prior to Visit  Medication Sig Dispense Refill   aspirin EC 81 MG tablet Take 81 mg by mouth daily.     budesonide-formoterol (SYMBICORT) 160-4.5 MCG/ACT inhaler INHALE 2 PUFFS INTO THE LUNGS TWICE DAILY 10.2 g 5   gabapentin (NEURONTIN) 300 MG capsule Take 300 mg by mouth at bedtime.     metoprolol succinate (TOPROL-XL) 25 MG 24 hr tablet Take 25 mg by mouth daily.     Multiple Vitamin (MULTIVITAMIN WITH MINERALS) TABS tablet Take 1 tablet by mouth daily.     pantoprazole (PROTONIX) 40 MG tablet TAKE 1 TABLET BY MOUTH ONCE A DAY 90 tablet 3   potassium chloride (KLOR-CON) 10 MEQ tablet Take 1 tablet (10 mEq total) by mouth daily. 90 tablet 3   simvastatin (ZOCOR) 40 MG tablet Take 40 mg by mouth every evening.     tamsulosin (FLOMAX) 0.4 MG CAPS capsule TAKE 1 CAPSULE BY MOUTH ONCE DAILY 90 capsule 3   traMADol (ULTRAM) 50 MG tablet Take 50 mg by mouth 2 (two) times daily as needed for moderate pain.     vancomycin (VANCOCIN) 125 MG capsule Take 1 capsule (125 mg total) by mouth 4 (four) times daily. For 7 days, then take 1 capsule by mouth 3 times daily for 7 days, then take 1 capsule by mouth 2 times daily for 7 days and then take 1 capsule by mouth once daily for 7 days. 70 capsule 0   No current facility-administered medications on file prior to visit.    Allergies  Allergen Reactions   Sulfa Antibiotics Rash    Benzalkonium Chloride     "Benzalkonium chloride is a quaternary ammonium antiseptic and disinfectant with actions and uses similar to those of other cationic surfactants. It is also used as an antimicrobial preservative for pharmaceutical products."   Neosporin [Neomycin-Bacitracin Zn-Polymyx]    Augmentin [Amoxicillin-Pot Clavulanate] Rash   Terbinafine Rash   Terbinafine And Related Rash    Past Medical History:  Diagnosis Date   Asthma    BPH (benign prostatic hypertrophy)    Cataract    Chronic renal disease, stage III (HCC)    Chronic sinusitis    COPD (chronic obstructive pulmonary disease) (HCC)    Coronary artery disease    2 stents   Gall stones    Hearing loss    DOES NOT WEAR HEARING AIDS   High cholesterol    Hypertension    Macular degeneration    right eye    Past Surgical History:  Procedure Laterality Date   APPENDECTOMY     CORONARY ANGIOPLASTY WITH STENT PLACEMENT  1998   2 stents   TRANSURETHRAL RESECTION OF PROSTATE      Family History  Problem Relation Age of Onset   Diabetes Father    Heart disease Brother     Social History   Socioeconomic History   Marital status: Married    Spouse name:  Not on file   Number of children: 4   Years of education: Not on file   Highest education level: Not on file  Occupational History   Occupation: Drove truck and concrete work  Tobacco Use   Smoking status: Former    Types: Cigarettes    Quit date: 08/13/2005    Years since quitting: 15.6   Smokeless tobacco: Current    Types: Chew   Tobacco comments:    discussed stopping this (only once in a while)  Vaping Use   Vaping Use: Never used  Substance and Sexual Activity   Alcohol use: No   Drug use: No   Sexual activity: Not on file  Other Topics Concern   Not on file  Social History Narrative   No living will   Wife, then [place] daughter should make decisions   Would accept resuscitation   Would probably accept tube feeds   Social  Determinants of Health   Financial Resource Strain: Not on file  Food Insecurity: Not on file  Transportation Needs: Not on file  Physical Activity: Not on file  Stress: Not on file  Social Connections: Not on file  Intimate Partner Violence: Not on file   Review of Systems No fever No diarrhea    Objective:   Physical Exam Constitutional:      Appearance: Normal appearance.  Musculoskeletal:     Comments: Clear improvement in right hand swelling Some improvement in redness of right forearm (less extensive) Still warm Some induration across from elbow still  Neurological:     Mental Status: He is alert.           Assessment & Plan:

## 2021-03-21 NOTE — Assessment & Plan Note (Signed)
Some improvement Will give rocephin again today and see tomorrow

## 2021-03-21 NOTE — Assessment & Plan Note (Signed)
Some improvement Has duplex exam today----if positive for DVT will start xarelto '15mg'$  bid x 21 days---then '20mg'$  daily for 3 months total (provoked DVT)

## 2021-03-21 NOTE — Addendum Note (Signed)
Addended by: Pilar Grammes on: 03/21/2021 12:36 PM   Modules accepted: Orders

## 2021-03-21 NOTE — Telephone Encounter (Signed)
Leon Estes from [facility] called with call report on patient. [place] for DVT was negative.    IMPRESSION: No evidence of DVT within the right upper extremity.  Patient could not stay and wait due to family emergency and asked to have [place] call his wife at [phone] with results  Results in epic also.

## 2021-03-21 NOTE — Telephone Encounter (Signed)
Reviewed results. Please notify pt.

## 2021-03-21 NOTE — Telephone Encounter (Signed)
Patient's wife advised as requested.

## 2021-03-22 ENCOUNTER — Encounter: Payer: Self-pay | Admitting: Internal Medicine

## 2021-03-22 ENCOUNTER — Other Ambulatory Visit: Payer: Self-pay

## 2021-03-22 ENCOUNTER — Ambulatory Visit (INDEPENDENT_AMBULATORY_CARE_PROVIDER_SITE_OTHER): Payer: PPO | Admitting: Internal Medicine

## 2021-03-22 DIAGNOSIS — L03113 Cellulitis of right upper limb: Secondary | ICD-10-CM

## 2021-03-22 MED ORDER — CEFTRIAXONE SODIUM 1 G IJ SOLR
1.0000 g | Freq: Once | INTRAMUSCULAR | Status: AC
  Administered 2021-03-22: 1 g via INTRAMUSCULAR

## 2021-03-22 NOTE — Assessment & Plan Note (Signed)
Improved but still abnormal Trying to avoid oral antibiotics due to severe C dif recently Will give rocephin again----1gm today See again tomorrow

## 2021-03-22 NOTE — Addendum Note (Signed)
Addended by: Tammi Sou on: 03/22/2021 02:01 PM   Modules accepted: Orders

## 2021-03-22 NOTE — Progress Notes (Signed)
Subjective:    Patient ID: Leon Estes, male    DOB: June 25, 1931, 87 y.o.   MRN: [id number]  HPI Here with wife for follow up of right arm cellulitis This visit occurred during the SARS-CoV-2 public health emergency.  Safety protocols were in place, including screening questions prior to the visit, additional usage of staff PPE, and extensive cleaning of exam room while observing appropriate contact time as indicated for disinfecting solutions.   Did have ultrasound which fortunately didn't show DVT  Yesterday evening --fell on deck (tripped ) Hit left arm--some bruising and open area  Right arm is better Some decreased swelling Not back to normal  Current Outpatient Medications on File Prior to Visit  Medication Sig Dispense Refill   aspirin EC 81 MG tablet Take 81 mg by mouth daily.     budesonide-formoterol (SYMBICORT) 160-4.5 MCG/ACT inhaler INHALE 2 PUFFS INTO THE LUNGS TWICE DAILY 10.2 g 5   gabapentin (NEURONTIN) 300 MG capsule Take 300 mg by mouth at bedtime.     metoprolol succinate (TOPROL-XL) 25 MG 24 hr tablet Take 25 mg by mouth daily.     Multiple Vitamin (MULTIVITAMIN WITH MINERALS) TABS tablet Take 1 tablet by mouth daily.     pantoprazole (PROTONIX) 40 MG tablet TAKE 1 TABLET BY MOUTH ONCE A DAY 90 tablet 3   potassium chloride (KLOR-CON) 10 MEQ tablet Take 1 tablet (10 mEq total) by mouth daily. 90 tablet 3   simvastatin (ZOCOR) 40 MG tablet Take 40 mg by mouth every evening.     tamsulosin (FLOMAX) 0.4 MG CAPS capsule TAKE 1 CAPSULE BY MOUTH ONCE DAILY 90 capsule 3   traMADol (ULTRAM) 50 MG tablet Take 50 mg by mouth 2 (two) times daily as needed for moderate pain.     vancomycin (VANCOCIN) 125 MG capsule Take 1 capsule (125 mg total) by mouth 4 (four) times daily. For 7 days, then take 1 capsule by mouth 3 times daily for 7 days, then take 1 capsule by mouth 2 times daily for 7 days and then take 1 capsule by mouth once daily for 7 days. 70 capsule 0   No  current facility-administered medications on file prior to visit.    Allergies  Allergen Reactions   Sulfa Antibiotics Rash   Benzalkonium Chloride     "Benzalkonium chloride is a quaternary ammonium antiseptic and disinfectant with actions and uses similar to those of other cationic surfactants. It is also used as an antimicrobial preservative for pharmaceutical products."   Neosporin [Neomycin-Bacitracin Zn-Polymyx]    Augmentin [Amoxicillin-Pot Clavulanate] Rash   Terbinafine Rash   Terbinafine And Related Rash    Past Medical History:  Diagnosis Date   Asthma    BPH (benign prostatic hypertrophy)    Cataract    Chronic renal disease, stage III (HCC)    Chronic sinusitis    COPD (chronic obstructive pulmonary disease) (HCC)    Coronary artery disease    2 stents   Gall stones    Hearing loss    DOES NOT WEAR HEARING AIDS   High cholesterol    Hypertension    Macular degeneration    right eye    Past Surgical History:  Procedure Laterality Date   APPENDECTOMY     CORONARY ANGIOPLASTY WITH STENT PLACEMENT  1998   2 stents   TRANSURETHRAL RESECTION OF PROSTATE      Family History  Problem Relation Age of Onset   Diabetes Father    Heart  disease Brother     Social History   Socioeconomic History   Marital status: Married    Spouse name: Not on file   Number of children: 4   Years of education: Not on file   Highest education level: Not on file  Occupational History   Occupation: Drove truck and concrete work  Tobacco Use   Smoking status: Former    Types: Cigarettes    Quit date: 08/13/2005    Years since quitting: 15.6   Smokeless tobacco: Current    Types: Chew   Tobacco comments:    discussed stopping this (only once in a while)  Vaping Use   Vaping Use: Never used  Substance and Sexual Activity   Alcohol use: No   Drug use: No   Sexual activity: Not on file  Other Topics Concern   Not on file  Social History Narrative   No living will    Wife, then [place] daughter should make decisions   Would accept resuscitation   Would probably accept tube feeds   Social Determinants of Health   Financial Resource Strain: Not on file  Food Insecurity: Not on file  Transportation Needs: Not on file  Physical Activity: Not on file  Stress: Not on file  Social Connections: Not on file  Intimate Partner Violence: Not on file   Review of Systems No fever Can use the right hand again--no pain    Objective:   Physical Exam Constitutional:      Appearance: Normal appearance.  Musculoskeletal:     Comments: Right arm swelling is much better Induration by elbow is significantly smaller Still some volar redness/warmth on forearm--but clear improvement  Skin:    Comments: Contusion on extensor left proximal forearm. Very small superficial abrasion  Neurological:     Mental Status: He is alert.           Assessment & Plan:

## 2021-03-22 NOTE — Telephone Encounter (Signed)
And I will again review at his visit today

## 2021-03-23 ENCOUNTER — Other Ambulatory Visit: Payer: Self-pay

## 2021-03-23 ENCOUNTER — Ambulatory Visit (INDEPENDENT_AMBULATORY_CARE_PROVIDER_SITE_OTHER): Payer: PPO | Admitting: Internal Medicine

## 2021-03-23 ENCOUNTER — Encounter: Payer: Self-pay | Admitting: Internal Medicine

## 2021-03-23 DIAGNOSIS — L03113 Cellulitis of right upper limb: Secondary | ICD-10-CM | POA: Diagnosis not present

## 2021-03-23 MED ORDER — CEFUROXIME AXETIL 250 MG PO TABS: 250.0000 mg | ORAL_TABLET | Freq: Two times a day (BID) | ORAL | 0 refills | Status: AC

## 2021-03-23 MED ORDER — CEFTRIAXONE SODIUM 1 G IJ SOLR
1.0000 g | Freq: Once | INTRAMUSCULAR | Status: AC
  Administered 2021-03-23: 1 g via INTRAMUSCULAR

## 2021-03-23 NOTE — Progress Notes (Signed)
Subjective:    Patient ID: Leon Estes, male    DOB: September 22, 1930, 87 y.o.   MRN: [id number]  HPI Here with wife for follow up of cellulitis of right arm This visit occurred during the SARS-CoV-2 public health emergency.  Safety protocols were in place, including screening questions prior to the visit, additional usage of staff PPE, and extensive cleaning of exam room while observing appropriate contact time as indicated for disinfecting solutions.   Notes continued improvement No problems with the injections Still has swollen area towards elbow No fever  Current Outpatient Medications on File Prior to Visit  Medication Sig Dispense Refill   aspirin EC 81 MG tablet Take 81 mg by mouth daily.     budesonide-formoterol (SYMBICORT) 160-4.5 MCG/ACT inhaler INHALE 2 PUFFS INTO THE LUNGS TWICE DAILY 10.2 g 5   gabapentin (NEURONTIN) 300 MG capsule Take 300 mg by mouth at bedtime.     metoprolol succinate (TOPROL-XL) 25 MG 24 hr tablet Take 25 mg by mouth daily.     Multiple Vitamin (MULTIVITAMIN WITH MINERALS) TABS tablet Take 1 tablet by mouth daily.     pantoprazole (PROTONIX) 40 MG tablet TAKE 1 TABLET BY MOUTH ONCE A DAY 90 tablet 3   potassium chloride (KLOR-CON) 10 MEQ tablet Take 1 tablet (10 mEq total) by mouth daily. 90 tablet 3   simvastatin (ZOCOR) 40 MG tablet Take 40 mg by mouth every evening.     tamsulosin (FLOMAX) 0.4 MG CAPS capsule TAKE 1 CAPSULE BY MOUTH ONCE DAILY 90 capsule 3   traMADol (ULTRAM) 50 MG tablet Take 50 mg by mouth 2 (two) times daily as needed for moderate pain.     vancomycin (VANCOCIN) 125 MG capsule Take 1 capsule (125 mg total) by mouth 4 (four) times daily. For 7 days, then take 1 capsule by mouth 3 times daily for 7 days, then take 1 capsule by mouth 2 times daily for 7 days and then take 1 capsule by mouth once daily for 7 days. 70 capsule 0   No current facility-administered medications on file prior to visit.    Allergies  Allergen Reactions    Sulfa Antibiotics Rash   Benzalkonium Chloride     "Benzalkonium chloride is a quaternary ammonium antiseptic and disinfectant with actions and uses similar to those of other cationic surfactants. It is also used as an antimicrobial preservative for pharmaceutical products."   Neosporin [Neomycin-Bacitracin Zn-Polymyx]    Augmentin [Amoxicillin-Pot Clavulanate] Rash   Terbinafine Rash   Terbinafine And Related Rash    Past Medical History:  Diagnosis Date   Asthma    BPH (benign prostatic hypertrophy)    Cataract    Chronic renal disease, stage III (HCC)    Chronic sinusitis    COPD (chronic obstructive pulmonary disease) (HCC)    Coronary artery disease    2 stents   Gall stones    Hearing loss    DOES NOT WEAR HEARING AIDS   High cholesterol    Hypertension    Macular degeneration    right eye    Past Surgical History:  Procedure Laterality Date   APPENDECTOMY     CORONARY ANGIOPLASTY WITH STENT PLACEMENT  1998   2 stents   TRANSURETHRAL RESECTION OF PROSTATE      Family History  Problem Relation Age of Onset   Diabetes Father    Heart disease Brother     Social History   Socioeconomic History   Marital status: Married  Spouse name: Not on file   Number of children: 4   Years of education: Not on file   Highest education level: Not on file  Occupational History   Occupation: Drove truck and concrete work  Tobacco Use   Smoking status: Former    Types: Cigarettes    Quit date: 08/13/2005    Years since quitting: 15.6   Smokeless tobacco: Current    Types: Chew   Tobacco comments:    discussed stopping this (only once in a while)  Vaping Use   Vaping Use: Never used  Substance and Sexual Activity   Alcohol use: No   Drug use: No   Sexual activity: Not on file  Other Topics Concern   Not on file  Social History Narrative   No living will   Wife, then [place] daughter should make decisions   Would accept resuscitation   Would probably accept tube  feeds   Social Determinants of Health   Financial Resource Strain: Not on file  Food Insecurity: Not on file  Transportation Needs: Not on file  Physical Activity: Not on file  Stress: Not on file  Social Connections: Not on file  Intimate Partner Violence: Not on file   Review of Systems No diarrhea Appetite is okay     Objective:   Physical Exam Musculoskeletal:     Comments: Continued improvement in swelling, redness and warmth of right arm            Assessment & Plan:

## 2021-03-23 NOTE — Addendum Note (Signed)
Addended by: Pilar Grammes on: 03/23/2021 05:05 PM   Modules accepted: Orders

## 2021-03-23 NOTE — Assessment & Plan Note (Signed)
Markedly improved overall Will give rocephin again---and then just observe Will give Rx for cefuroxime to start if it worsens over the weekend

## 2021-04-03 ENCOUNTER — Emergency Department (HOSPITAL_COMMUNITY): Payer: PPO

## 2021-04-03 ENCOUNTER — Emergency Department (HOSPITAL_COMMUNITY)
Admission: EM | Admit: 2021-04-03 | Discharge: 2021-04-03 | Disposition: A | Discharge status: Home / Self Care | Payer: PPO | Attending: Emergency Medicine | Admitting: Emergency Medicine

## 2021-04-03 DIAGNOSIS — J449 Chronic obstructive pulmonary disease, unspecified: Secondary | ICD-10-CM | POA: Insufficient documentation

## 2021-04-03 DIAGNOSIS — I2510 Atherosclerotic heart disease of native coronary artery without angina pectoris: Secondary | ICD-10-CM | POA: Insufficient documentation

## 2021-04-03 DIAGNOSIS — N183 Chronic kidney disease, stage 3 unspecified: Secondary | ICD-10-CM | POA: Diagnosis not present

## 2021-04-03 DIAGNOSIS — I129 Hypertensive chronic kidney disease with stage 1 through stage 4 chronic kidney disease, or unspecified chronic kidney disease: Secondary | ICD-10-CM | POA: Diagnosis not present

## 2021-04-03 DIAGNOSIS — D3611 Benign neoplasm of peripheral nerves and autonomic nervous system of face, head, and neck: Secondary | ICD-10-CM | POA: Diagnosis not present

## 2021-04-03 DIAGNOSIS — Z79899 Other long term (current) drug therapy: Secondary | ICD-10-CM | POA: Insufficient documentation

## 2021-04-03 DIAGNOSIS — J45909 Unspecified asthma, uncomplicated: Secondary | ICD-10-CM | POA: Insufficient documentation

## 2021-04-03 DIAGNOSIS — Z7982 Long term (current) use of aspirin: Secondary | ICD-10-CM | POA: Diagnosis not present

## 2021-04-03 DIAGNOSIS — R072 Precordial pain: Secondary | ICD-10-CM | POA: Diagnosis not present

## 2021-04-03 DIAGNOSIS — R220 Localized swelling, mass and lump, head: Secondary | ICD-10-CM | POA: Diagnosis not present

## 2021-04-03 DIAGNOSIS — R079 Chest pain, unspecified: Secondary | ICD-10-CM | POA: Diagnosis not present

## 2021-04-03 DIAGNOSIS — I10 Essential (primary) hypertension: Secondary | ICD-10-CM | POA: Diagnosis not present

## 2021-04-03 DIAGNOSIS — Z87891 Personal history of nicotine dependence: Secondary | ICD-10-CM | POA: Diagnosis not present

## 2021-04-03 DIAGNOSIS — Z7951 Long term (current) use of inhaled steroids: Secondary | ICD-10-CM | POA: Diagnosis not present

## 2021-04-03 DIAGNOSIS — G9389 Other specified disorders of brain: Secondary | ICD-10-CM

## 2021-04-03 DIAGNOSIS — R519 Headache, unspecified: Secondary | ICD-10-CM | POA: Diagnosis not present

## 2021-04-03 DIAGNOSIS — R0789 Other chest pain: Secondary | ICD-10-CM | POA: Diagnosis not present

## 2021-04-03 DIAGNOSIS — I672 Cerebral atherosclerosis: Secondary | ICD-10-CM | POA: Diagnosis not present

## 2021-04-03 DIAGNOSIS — G4489 Other headache syndrome: Secondary | ICD-10-CM | POA: Diagnosis not present

## 2021-04-03 DIAGNOSIS — G4485 Primary stabbing headache: Secondary | ICD-10-CM | POA: Diagnosis not present

## 2021-04-03 LAB — COMPREHENSIVE METABOLIC PANEL
ALT: 18 U/L (ref 0–44)
AST: 26 U/L (ref 15–41)
Albumin: 2.7 g/dL — ABNORMAL LOW (ref 3.5–5.0)
Alkaline Phosphatase: 77 U/L (ref 38–126)
Anion gap: 5 (ref 5–15)
BUN: 6 mg/dL — ABNORMAL LOW (ref 8–23)
CO2: 28 mmol/L (ref 22–32)
Calcium: 8.5 mg/dL — ABNORMAL LOW (ref 8.9–10.3)
Chloride: 105 mmol/L (ref 98–111)
Creatinine, Ser: 0.92 mg/dL (ref 0.61–1.24)
GFR, Estimated: >60 mL/min (ref >60)
Glucose, Bld: 98 mg/dL (ref 70–99)
Potassium: 3.6 mmol/L (ref 3.5–5.1)
Sodium: 138 mmol/L (ref 135–145)
Total Bilirubin: 0.5 mg/dL (ref 0.3–1.2)
Total Protein: 6.1 g/dL — ABNORMAL LOW (ref 6.5–8.1)

## 2021-04-03 LAB — CBC WITH DIFFERENTIAL/PLATELET
Abs Immature Granulocytes: 0.04 10*3/uL (ref 0.00–0.07)
Basophils Absolute: 0.1 10*3/uL (ref 0.0–0.1)
Basophils Relative: 1 %
Eosinophils Absolute: 0.5 10*3/uL (ref 0.0–0.5)
Eosinophils Relative: 5 %
HCT: 35 % — ABNORMAL LOW (ref 39.0–52.0)
Hemoglobin: 10.7 g/dL — ABNORMAL LOW (ref 13.0–17.0)
Immature Granulocytes: 0 %
Lymphocytes Relative: 33 %
Lymphs Abs: 3.5 10*3/uL (ref 0.7–4.0)
MCH: 27.4 pg (ref 26.0–34.0)
MCHC: 30.6 g/dL (ref 30.0–36.0)
MCV: 89.7 fL (ref 80.0–100.0)
Monocytes Absolute: 1.2 10*3/uL — ABNORMAL HIGH (ref 0.1–1.0)
Monocytes Relative: 12 %
Neutro Abs: 5.3 10*3/uL (ref 1.7–7.7)
Neutrophils Relative %: 49 %
Platelets: 240 10*3/uL (ref 150–400)
RBC: 3.9 MIL/uL — ABNORMAL LOW (ref 4.22–5.81)
RDW: 19.9 % — ABNORMAL HIGH (ref 11.5–15.5)
WBC: 10.7 10*3/uL — ABNORMAL HIGH (ref 4.0–10.5)
nRBC: 0 % (ref 0.0–0.2)

## 2021-04-03 LAB — TROPONIN I (HIGH SENSITIVITY)
Troponin I (High Sensitivity): 14 ng/L (ref <18)
Troponin I (High Sensitivity): 14 ng/L (ref <18)

## 2021-04-03 MED ORDER — GADOBUTROL 1 MMOL/ML IV SOLN
6.0000 mL | Freq: Once | INTRAVENOUS | Status: AC | PRN
  Administered 2021-04-03: 6 mL via INTRAVENOUS

## 2021-04-03 NOTE — ED Notes (Signed)
Pt's spouse refusing to leave until EDP evaluates pt's Left arm from a previous fall. EDP requested this RN to re wrap arm, no new orders at this time.

## 2021-04-03 NOTE — ED Notes (Addendum)
Family updated as to patient's status. Spouse talked with wife

## 2021-04-03 NOTE — ED Notes (Signed)
This RN escorted pt to his car and assisted pt into car

## 2021-04-03 NOTE — Consult Note (Signed)
Neurosurgery Consultation  Reason for Consult: Brain tumor Referring Physician: Tamera Punt  CC: Headache / chest pain  HPI: This is a 87 y.o. man that presents with acute onset left temporal (pterional) pain concurrent with chest pain. It resolved, hasn't had symptoms like this before, he was concerned so he came to the ED. He denies any current symptoms, no new weakness, numbness, or parasthesias, does endorse left hearing loss and left sided visual impairment that are both chronic. No facial pain / paresthesias, no diplopia.   ROS: A 14 point ROS was performed and is negative except as noted in the HPI.   PMHx:  Past Medical History:  Diagnosis Date   Asthma    BPH (benign prostatic hypertrophy)    Cataract    Chronic renal disease, stage III (HCC)    Chronic sinusitis    COPD (chronic obstructive pulmonary disease) (HCC)    Coronary artery disease    2 stents   Gall stones    Hearing loss    DOES NOT WEAR HEARING AIDS   High cholesterol    Hypertension    Macular degeneration    right eye   FamHx:  Family History  Problem Relation Age of Onset   Diabetes Father    Heart disease Brother    SocHx:  reports that he quit smoking about 15 years ago. His smoking use included cigarettes. His smokeless tobacco use includes chew. He reports that he does not drink alcohol and does not use drugs.  Exam: Vital signs in last 24 hours: Temp:  [98.5 F (36.9 C)] 98.5 F (36.9 C) (08/22 0543) Pulse Rate:  [77-93] 84 (08/22 1200) Resp:  [14-20] 20 (08/22 1200) BP: (136-162)/(79-144) 157/144 (08/22 1200) SpO2:  [92 %-100 %] 92 % (08/22 1200) Weight:  [65.8 kg] 65.8 kg (08/22 0545) General: Awake, alert, cooperative, lying in bed in NAD Head: Normocephalic and atruamatic HEENT: Neck supple Pulmonary: breathing room air comfortably, no evidence of increased work of breathing Cardiac: RRR Abdomen: S NT ND Extremities: Warm and well perfused x4 Neuro: AOx3, PERRL, EOMI, FS, very hard  of hearing, hearing loss clearly worse on the left Strength 5/5 x4, SILTx4, no drift   Assessment and Plan: 87 y.o. man w/ self-limited headache and chest pain. CTH/MRI brain personally reviewed, which show a cystic left CPA mass, likely a cystic schwannoma, no associated hydrocephalus.   -no acute neurosurgical intervention indicated at this time -discussed with the patient, also called and spoke to his wife about the above. Likely an incidentaloma, shouldn't cause the above symptoms. Likely a slow growing cystic schwannoma, will need to f/u with me in clinic in 4-4mow/ repeat MRI to evaluate for rate of growth or sooner if he has new symptoms. This likely explains his hearing loss on the left side but not any of his other symptoms. -please call with any concerns or questions  TJudith Part MD 04/03/21 1:05 PM [facility]

## 2021-04-03 NOTE — ED Notes (Signed)
Patient transported to MRI 

## 2021-04-03 NOTE — ED Notes (Signed)
Pt's pocketknife placed in security. Pt made aware.

## 2021-04-03 NOTE — ED Notes (Signed)
Spoke with charge regarding pt's dc status, pt will be moved H22 per charge request

## 2021-04-03 NOTE — ED Notes (Signed)
Spouse, Wilburn Cornelia updated as to patient's status. Spouse requesting to have pt's Right arm evaluated. Spouse states, "his whole Right arm is tore up from a fall the other day"

## 2021-04-03 NOTE — ED Notes (Signed)
Pt repositioned, cleaned pt's face and eye area. No complaints at this time

## 2021-04-03 NOTE — ED Notes (Signed)
This RN unable to assist pt with something to eat. [employer] called for pt a diet tray. Also, [employer] gave pt a [place] sandwich and water until tray will arrive.

## 2021-04-03 NOTE — ED Notes (Signed)
Per spouse pt's daughter is picking her up at approx 1430 and they will most likely be here 1530 to pick up patient

## 2021-04-03 NOTE — Discharge Instructions (Addendum)
Make an appointment to follow-up with Dr. Venetia Constable regarding the lesion in your brain.  Follow-up with your primary care doctor regarding your chest pain.  Return to emergency room if you have any worsening symptoms.

## 2021-04-03 NOTE — ED Notes (Signed)
This RN spoke with pt's wife on the phone regarding pt update. This RN assured spouse that pt is no longer c/o chest pain as of right now, and also informed spouse that the ER is running blood tests and imaging tests on pt. Pt's spouse requested to be called at the listed home phone number for any updates. This RN also confirmed that pt's spouse has the phone number for the Emergency Dept for whenever she would like to call for updates.

## 2021-04-03 NOTE — ED Triage Notes (Addendum)
Pt awoke with sharp, non-radiating chest pain in center of chest. Headache noted to L side of head. Not c/o chest pain upon arrival to ED. Pt states he has not missed any medication dosages. Hx of cardiac stent in 1997.   324 aspirin given, 0.8 nitro given by EMS.  18G IV in L AC

## 2021-04-03 NOTE — ED Provider Notes (Signed)
[facility] EMERGENCY DEPARTMENT Provider Note   CSN: [id number] Arrival date & time: 04/03/21  [id number]     History Chief Complaint  Patient presents with   Chest Pain   Headache    Leon Estes is a 87 y.o. male.  Patient is an 87 year old male with past medical history of coronary artery disease with stent placement in 1997, hypertension, COPD.  Patient presenting today for evaluation of chest pain and headache.  This woke him from sleep at approximately 445.  He describes sharp pain in his chest that lasted approximately 10 seconds followed by a sharp pain to his left temple that also lasted about 10 seconds.  Both resolved spontaneously.  He did receive aspirin by EMS and was transported here.  Patient is currently symptom-free.  The history is provided by the patient.  Chest Pain Pain location:  Substernal area Pain quality: sharp   Pain radiates to:  Does not radiate Pain severity:  Moderate Onset quality:  Sudden Progression:  Resolved Associated symptoms: headache   Headache     Past Medical History:  Diagnosis Date   Asthma    BPH (benign prostatic hypertrophy)    Cataract    Chronic renal disease, stage III (HCC)    Chronic sinusitis    COPD (chronic obstructive pulmonary disease) (HCC)    Coronary artery disease    2 stents   Gall stones    Hearing loss    DOES NOT WEAR HEARING AIDS   High cholesterol    Hypertension    Macular degeneration    right eye    Patient Active Problem List   Diagnosis Date Noted   Cellulitis of right arm 03/21/2021   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   Delirium 02/08/2021   Goals of care, counseling/discussion    Colitis due to Clostridioides difficile 12/31/2020   HOH (hard of hearing) 12/28/2020   Acute on chronic cholecystitis 12/28/2020   Arrhythmia 12/06/2020   Pulmonary nodule 12/06/2020   Acute cholecystitis 11/26/2020   Preventative health care 10/12/2020   Macular  degeneration, wet ([place]) 10/12/2020   Acquired claw toe, left 02/02/2020   BPH (benign prostatic hyperplasia) 01/13/2020   Neuropathy 02/18/2018   COPD (chronic obstructive pulmonary disease) ([place])    Chronic renal disease, stage III ([place])    Benign essential HTN 04/09/2017   CAD (coronary artery disease) 03/03/2015   Hyperlipidemia 03/03/2015    Past Surgical History:  Procedure Laterality Date   APPENDECTOMY     CORONARY ANGIOPLASTY WITH STENT PLACEMENT  1998   2 stents   TRANSURETHRAL RESECTION OF PROSTATE         Family History  Problem Relation Age of Onset   Diabetes Father    Heart disease Brother     Social History   Tobacco Use   Smoking status: Former    Types: Cigarettes    Quit date: 08/13/2005    Years since quitting: 15.6   Smokeless tobacco: Current    Types: Chew   Tobacco comments:    discussed stopping this (only once in a while)  Vaping Use   Vaping Use: Never used  Substance Use Topics   Alcohol use: No   Drug use: No    Home Medications Prior to Admission medications   Medication Sig Start Date End Date Taking? Authorizing Provider  aspirin EC 81 MG tablet Take 81 mg by mouth daily.    [provider]  budesonide-formoterol (SYMBICORT) 160-4.5 MCG/ACT inhaler  INHALE 2 PUFFS INTO THE LUNGS TWICE DAILY 09/28/20   Viviana Simpler I, MD  gabapentin (NEURONTIN) 300 MG capsule Take 300 mg by mouth at bedtime.    [provider]  metoprolol succinate (TOPROL-XL) 25 MG 24 hr tablet Take 25 mg by mouth daily.    [provider]  Multiple Vitamin (MULTIVITAMIN WITH MINERALS) TABS tablet Take 1 tablet by mouth daily. 01/23/21   Georgette Shell, MD  pantoprazole (PROTONIX) 40 MG tablet TAKE 1 TABLET BY MOUTH ONCE A DAY 08/15/20   Viviana Simpler I, MD  potassium chloride (KLOR-CON) 10 MEQ tablet Take 1 tablet (10 mEq total) by mouth daily. 03/15/21   Venia Carbon, MD  simvastatin (ZOCOR) 40 MG tablet Take 40 mg by mouth every  evening.    [provider]  tamsulosin (FLOMAX) 0.4 MG CAPS capsule TAKE 1 CAPSULE BY MOUTH ONCE DAILY 07/14/20   Venia Carbon, MD  traMADol (ULTRAM) 50 MG tablet Take 50 mg by mouth 2 (two) times daily as needed for moderate pain. 08/15/17   [provider]  vancomycin (VANCOCIN) 125 MG capsule Take 1 capsule (125 mg total) by mouth 4 (four) times daily. For 7 days, then take 1 capsule by mouth 3 times daily for 7 days, then take 1 capsule by mouth 2 times daily for 7 days and then take 1 capsule by mouth once daily for 7 days. 03/06/21   Tower, Wynelle Fanny, MD    Allergies    Sulfa antibiotics, Benzalkonium chloride, Neosporin [neomycin-bacitracin zn-polymyx], Augmentin [amoxicillin-pot clavulanate], Terbinafine, and Terbinafine and related  Review of Systems   Review of Systems  Cardiovascular:  Positive for chest pain.  Neurological:  Positive for headaches.  All other systems reviewed and are negative.  Physical Exam Updated Vital Signs BP (!) 146/87 (BP Location: Left Arm)   Pulse 87   Temp 98.5 F (36.9 C) (Oral)   Resp 16   Ht '5\' 6"'$  (1.676 m)   Wt 65.8 kg   SpO2 98%   BMI 23.40 kg/m   Physical Exam Vitals and nursing note reviewed.  Constitutional:      General: He is not in acute distress.    Appearance: He is well-developed. He is not diaphoretic.  HENT:     Head: Normocephalic and atraumatic.  Cardiovascular:     Rate and Rhythm: Normal rate and regular rhythm.     Heart sounds: No murmur heard.   No friction rub.  Pulmonary:     Effort: Pulmonary effort is normal. No respiratory distress.     Breath sounds: Normal breath sounds. No wheezing or rales.  Abdominal:     General: Bowel sounds are normal. There is no distension.     Palpations: Abdomen is soft.     Tenderness: There is no abdominal tenderness.  Musculoskeletal:        General: Normal range of motion.     Cervical back: Normal range of motion and neck supple.     Right lower leg:  No edema.     Left lower leg: No edema.  Skin:    General: Skin is warm and dry.  Neurological:     General: No focal deficit present.     Mental Status: He is alert and oriented to person, place, and time.     Cranial Nerves: No cranial nerve deficit.     Motor: No weakness.     Coordination: Coordination normal.    ED Results / Procedures /  Treatments   Labs (all labs ordered are listed, but only abnormal results are displayed) Labs Reviewed  COMPREHENSIVE METABOLIC PANEL  CBC WITH DIFFERENTIAL/PLATELET  TROPONIN I (HIGH SENSITIVITY)    EKG EKG Interpretation  Date/Time:  Monday April 03 2021 05:54:05 EDT Ventricular Rate:  80 PR Interval:  215 QRS Duration: 144 QT Interval:  444 QTC Calculation: 513 R Axis:   62 Text Interpretation: Sinus rhythm Sinus pause Borderline prolonged PR interval Right bundle branch block When compared with ecg dated 12/30/2020, Sinus Rhythm has replaced atrial fibrillation Otherwise unchanged Confirmed by Veryl Speak [id number]) on 04/03/2021 6:05:40 AM  Radiology No results found.  Procedures Procedures   Medications Ordered in ED Medications - No data to display  ED Course  I have reviewed the triage vital signs and the nursing notes.  Pertinent labs & imaging results that were available during my care of the patient were reviewed by me and considered in my medical decision making (see chart for details).    MDM Rules/Calculators/A&P  Patient brought by EMS from home for evaluation of 10 seconds of chest pain followed by 10 seconds of headache.  He is now symptom-free.  Patient's work-up thus far reveals negative troponin and unchanged EKG.  Patient to be sent for a head CT and undergo second troponin.  Care signed out to Dr. Tamera Punt at shift change.  She will obtain the results of the CT scan and troponin and determine the final disposition.  Final Clinical Impression(s) / ED Diagnoses Final diagnoses:  None    Rx / DC  Orders ED Discharge Orders     None        Veryl Speak, MD 04/04/21 [phone]

## 2021-04-03 NOTE — ED Provider Notes (Signed)
Care was taken over from Dr. Lanny Cramp.  Patient presented with an 10-second episode of chest pain that then radiated to his head and he had a 10-second episode of head pain.  Since he has been in the ED he has had some intermittent episodes of pain to his left temporal area but no pain in his chest.  No persistent pain or headache.  He had a head CT which showed a mass in his cerebellar pontine area.  On the left.  MRI was recommended which was performed.  This does show a mass.  Dr. Zada Finders with neurosurgery was consulted.  He came down and evaluated the patient and reviewed the scans.  He reports patient can be discharged with outpatient follow-up with him.  He does not recommend any further treatment at this point.  He has had 2 negative troponins.  His EKG does not show any ischemic changes.  No ongoing chest pain.  He was discharged home in good condition.  He was encouraged to follow-up with his PCP regarding this.  His blood pressure is a little bit elevated but he reports he has not taken his blood pressure medicine today.   Malvin Johns, MD 04/03/21 1351

## 2021-04-03 NOTE — ED Notes (Signed)
Neursurgery at bedside

## 2021-04-07 ENCOUNTER — Encounter: Payer: Self-pay | Admitting: Internal Medicine

## 2021-04-07 ENCOUNTER — Ambulatory Visit (INDEPENDENT_AMBULATORY_CARE_PROVIDER_SITE_OTHER): Payer: PPO | Admitting: Internal Medicine

## 2021-04-07 ENCOUNTER — Other Ambulatory Visit: Payer: Self-pay

## 2021-04-07 DIAGNOSIS — S41111A Laceration without foreign body of right upper arm, initial encounter: Secondary | ICD-10-CM | POA: Diagnosis not present

## 2021-04-07 DIAGNOSIS — D496 Neoplasm of unspecified behavior of brain: Secondary | ICD-10-CM | POA: Insufficient documentation

## 2021-04-07 DIAGNOSIS — I10 Essential (primary) hypertension: Secondary | ICD-10-CM

## 2021-04-07 DIAGNOSIS — A0471 Enterocolitis due to Clostridium difficile, recurrent: Secondary | ICD-10-CM | POA: Diagnosis not present

## 2021-04-07 NOTE — Assessment & Plan Note (Signed)
Finishing out extended vancomycin

## 2021-04-07 NOTE — Assessment & Plan Note (Signed)
Redressed with petroleum dressing and gauze No infection Will stop the keflex

## 2021-04-07 NOTE — Progress Notes (Signed)
Subjective:    Patient ID: Leon Estes, male    DOB: [birth year], 87 y.o.   MRN: [id number]  HPI Here for ER follow up---with wife This visit occurred during the SARS-CoV-2 public health emergency.  Safety protocols were in place, including screening questions prior to the visit, additional usage of staff PPE, and extensive cleaning of exam room while observing appropriate contact time as indicated for disinfecting solutions.   [place] [address] again 4 days ago--trying to sit in chair and backed up and tripped over walker/tray Ripped open right upper arm Not really dizzy Just having balance problems CT then MRI show brain mass---likely Schwannoma  Current Outpatient Medications on File Prior to Visit  Medication Sig Dispense Refill   aspirin EC 81 MG tablet Take 81 mg by mouth daily.     budesonide-formoterol (SYMBICORT) 160-4.5 MCG/ACT inhaler INHALE 2 PUFFS INTO THE LUNGS TWICE DAILY (Patient taking differently: Inhale 2 puffs into the lungs in the morning and at bedtime.) 10.2 g 5   cephALEXin (KEFLEX) 250 MG capsule Take by mouth 4 (four) times daily.     gabapentin (NEURONTIN) 300 MG capsule Take 300 mg by mouth at bedtime.     metoprolol succinate (TOPROL-XL) 25 MG 24 hr tablet Take 25 mg by mouth daily.     Multiple Vitamin (MULTIVITAMIN WITH MINERALS) TABS tablet Take 1 tablet by mouth daily.     pantoprazole (PROTONIX) 40 MG tablet TAKE 1 TABLET BY MOUTH ONCE A DAY (Patient taking differently: Take 40 mg by mouth daily.) 90 tablet 3   potassium chloride (KLOR-CON) 10 MEQ tablet Take 1 tablet (10 mEq total) by mouth daily. 90 tablet 3   simvastatin (ZOCOR) 40 MG tablet Take 40 mg by mouth every evening.     tamsulosin (FLOMAX) 0.4 MG CAPS capsule TAKE 1 CAPSULE BY MOUTH ONCE DAILY (Patient taking differently: Take 0.4 mg by mouth daily.) 90 capsule 3   traMADol (ULTRAM) 50 MG tablet Take 50 mg by mouth 2 (two) times daily as needed for moderate pain.     vancomycin (VANCOCIN) 125 MG  capsule Take 1 capsule (125 mg total) by mouth 4 (four) times daily. For 7 days, then take 1 capsule by mouth 3 times daily for 7 days, then take 1 capsule by mouth 2 times daily for 7 days and then take 1 capsule by mouth once daily for 7 days. 70 capsule 0   No current facility-administered medications on file prior to visit.    Allergies  Allergen Reactions   Sulfa Antibiotics Rash   Benzalkonium Chloride     "Benzalkonium chloride is a quaternary ammonium antiseptic and disinfectant with actions and uses similar to those of other cationic surfactants. It is also used as an antimicrobial preservative for pharmaceutical products."   Neosporin [Neomycin-Bacitracin Zn-Polymyx]    Augmentin [Amoxicillin-Pot Clavulanate] Rash   Terbinafine Rash   Terbinafine And Related Rash    Past Medical History:  Diagnosis Date   Asthma    BPH (benign prostatic hypertrophy)    Cataract    Chronic renal disease, stage III (HCC)    Chronic sinusitis    COPD (chronic obstructive pulmonary disease) (HCC)    Coronary artery disease    2 stents   Gall stones    Hearing loss    DOES NOT WEAR HEARING AIDS   High cholesterol    Hypertension    Macular degeneration    right eye    Past Surgical History:  Procedure Laterality Date  APPENDECTOMY     CORONARY ANGIOPLASTY WITH STENT PLACEMENT  1998   2 stents   TRANSURETHRAL RESECTION OF PROSTATE      Family History  Problem Relation Age of Onset   Diabetes Father    Heart disease Brother     Social History   Socioeconomic History   Marital status: Married    Spouse name: Not on file   Number of children: 4   Years of education: Not on file   Highest education level: Not on file  Occupational History   Occupation: Drove truck and concrete work  Tobacco Use   Smoking status: Former    Types: Cigarettes    Quit date: 08/13/2005    Years since quitting: 15.6   Smokeless tobacco: Current    Types: Chew   Tobacco comments:     discussed stopping this (only once in a while)  Vaping Use   Vaping Use: Never used  Substance and Sexual Activity   Alcohol use: No   Drug use: No   Sexual activity: Not on file  Other Topics Concern   Not on file  Social History Narrative   No living will   Wife, then [place] daughter should make decisions   Would accept resuscitation   Would probably accept tube feeds   Social Determinants of Health   Financial Resource Strain: Not on file  Food Insecurity: Not on file  Transportation Needs: Not on file  Physical Activity: Not on file  Stress: Not on file  Social Connections: Not on file  Intimate Partner Violence: Not on file   Review of Systems Eating okay No diarrhea ---still goes twice a day or so. Has 8 more days of the antiboitic     Objective:   Physical Exam Cardiovascular:     Rate and Rhythm: Normal rate and regular rhythm.     Heart sounds: No murmur heard.   No gallop.  Pulmonary:     Effort: Pulmonary effort is normal.     Breath sounds: Normal breath sounds. No wheezing or rales.  Abdominal:     Palpations: Abdomen is soft.     Tenderness: There is no abdominal tenderness.  Musculoskeletal:     Cervical back: Neck supple.  Lymphadenopathy:     Cervical: No cervical adenopathy.  Skin:    Comments: Extended skin tear on lateral right arm--no infection           Assessment & Plan:

## 2021-04-07 NOTE — Assessment & Plan Note (Signed)
BP Readings from Last 3 Encounters:  04/07/21 (!) 92/58  04/03/21 (!) 173/101  03/23/21 130/72   Now low Will stop the metoprolol

## 2021-04-07 NOTE — Assessment & Plan Note (Signed)
Likely Schwannoma but with some mass effect Unclear if this is related to his balance and falling Will see neurosurgeon in 2 weeks

## 2021-04-18 ENCOUNTER — Encounter (HOSPITAL_COMMUNITY): Payer: Self-pay

## 2021-04-18 ENCOUNTER — Emergency Department (HOSPITAL_COMMUNITY): Payer: PPO

## 2021-04-18 ENCOUNTER — Inpatient Hospital Stay (HOSPITAL_COMMUNITY)
Admission: EM | Admit: 2021-04-18 | Discharge: 2021-04-30 | DRG: 092 | Disposition: A | Discharge status: Home Health | Payer: PPO | Attending: Family Medicine | Admitting: Family Medicine

## 2021-04-18 DIAGNOSIS — I6602 Occlusion and stenosis of left middle cerebral artery: Secondary | ICD-10-CM | POA: Diagnosis not present

## 2021-04-18 DIAGNOSIS — K819 Cholecystitis, unspecified: Secondary | ICD-10-CM | POA: Diagnosis present

## 2021-04-18 DIAGNOSIS — K8010 Calculus of gallbladder with chronic cholecystitis without obstruction: Secondary | ICD-10-CM | POA: Diagnosis present

## 2021-04-18 DIAGNOSIS — N400 Enlarged prostate without lower urinary tract symptoms: Secondary | ICD-10-CM | POA: Diagnosis present

## 2021-04-18 DIAGNOSIS — Z66 Do not resuscitate: Secondary | ICD-10-CM | POA: Diagnosis present

## 2021-04-18 DIAGNOSIS — J449 Chronic obstructive pulmonary disease, unspecified: Secondary | ICD-10-CM | POA: Diagnosis present

## 2021-04-18 DIAGNOSIS — Z79899 Other long term (current) drug therapy: Secondary | ICD-10-CM

## 2021-04-18 DIAGNOSIS — Z955 Presence of coronary angioplasty implant and graft: Secondary | ICD-10-CM | POA: Diagnosis not present

## 2021-04-18 DIAGNOSIS — H9190 Unspecified hearing loss, unspecified ear: Secondary | ICD-10-CM | POA: Diagnosis present

## 2021-04-18 DIAGNOSIS — A0471 Enterocolitis due to Clostridium difficile, recurrent: Secondary | ICD-10-CM | POA: Diagnosis present

## 2021-04-18 DIAGNOSIS — G9341 Metabolic encephalopathy: Secondary | ICD-10-CM | POA: Diagnosis not present

## 2021-04-18 DIAGNOSIS — R4182 Altered mental status, unspecified: Secondary | ICD-10-CM

## 2021-04-18 DIAGNOSIS — R4701 Aphasia: Secondary | ICD-10-CM | POA: Diagnosis present

## 2021-04-18 DIAGNOSIS — H353290 Exudative age-related macular degeneration, unspecified eye, stage unspecified: Secondary | ICD-10-CM | POA: Diagnosis present

## 2021-04-18 DIAGNOSIS — R531 Weakness: Secondary | ICD-10-CM | POA: Diagnosis not present

## 2021-04-18 DIAGNOSIS — D3610 Benign neoplasm of peripheral nerves and autonomic nervous system, unspecified: Secondary | ICD-10-CM | POA: Diagnosis present

## 2021-04-18 DIAGNOSIS — H5410 Blindness, one eye, low vision other eye, unspecified eyes: Secondary | ICD-10-CM | POA: Diagnosis not present

## 2021-04-18 DIAGNOSIS — Z7951 Long term (current) use of inhaled steroids: Secondary | ICD-10-CM | POA: Diagnosis not present

## 2021-04-18 DIAGNOSIS — Z8249 Family history of ischemic heart disease and other diseases of the circulatory system: Secondary | ICD-10-CM

## 2021-04-18 DIAGNOSIS — E860 Dehydration: Secondary | ICD-10-CM | POA: Diagnosis not present

## 2021-04-18 DIAGNOSIS — H9193 Unspecified hearing loss, bilateral: Secondary | ICD-10-CM | POA: Diagnosis not present

## 2021-04-18 DIAGNOSIS — Z7982 Long term (current) use of aspirin: Secondary | ICD-10-CM

## 2021-04-18 DIAGNOSIS — I672 Cerebral atherosclerosis: Secondary | ICD-10-CM | POA: Diagnosis not present

## 2021-04-18 DIAGNOSIS — R0602 Shortness of breath: Secondary | ICD-10-CM | POA: Diagnosis not present

## 2021-04-18 DIAGNOSIS — J90 Pleural effusion, not elsewhere classified: Secondary | ICD-10-CM | POA: Diagnosis not present

## 2021-04-18 DIAGNOSIS — H5450 Low vision, one eye, unspecified eye: Secondary | ICD-10-CM | POA: Diagnosis not present

## 2021-04-18 DIAGNOSIS — D496 Neoplasm of unspecified behavior of brain: Secondary | ICD-10-CM | POA: Diagnosis not present

## 2021-04-18 DIAGNOSIS — Z20822 Contact with and (suspected) exposure to covid-19: Secondary | ICD-10-CM | POA: Diagnosis present

## 2021-04-18 DIAGNOSIS — G928 Other toxic encephalopathy: Principal | ICD-10-CM | POA: Diagnosis present

## 2021-04-18 DIAGNOSIS — H547 Unspecified visual loss: Secondary | ICD-10-CM | POA: Diagnosis not present

## 2021-04-18 DIAGNOSIS — J189 Pneumonia, unspecified organism: Secondary | ICD-10-CM | POA: Diagnosis not present

## 2021-04-18 DIAGNOSIS — Z833 Family history of diabetes mellitus: Secondary | ICD-10-CM | POA: Diagnosis not present

## 2021-04-18 DIAGNOSIS — E876 Hypokalemia: Secondary | ICD-10-CM | POA: Diagnosis present

## 2021-04-18 DIAGNOSIS — R29810 Facial weakness: Secondary | ICD-10-CM | POA: Diagnosis not present

## 2021-04-18 DIAGNOSIS — Z882 Allergy status to sulfonamides status: Secondary | ICD-10-CM

## 2021-04-18 DIAGNOSIS — R001 Bradycardia, unspecified: Secondary | ICD-10-CM | POA: Diagnosis not present

## 2021-04-18 DIAGNOSIS — I2510 Atherosclerotic heart disease of native coronary artery without angina pectoris: Secondary | ICD-10-CM | POA: Diagnosis present

## 2021-04-18 DIAGNOSIS — R9431 Abnormal electrocardiogram [ECG] [EKG]: Secondary | ICD-10-CM | POA: Diagnosis not present

## 2021-04-18 DIAGNOSIS — R404 Transient alteration of awareness: Secondary | ICD-10-CM | POA: Diagnosis not present

## 2021-04-18 DIAGNOSIS — R410 Disorientation, unspecified: Secondary | ICD-10-CM | POA: Diagnosis not present

## 2021-04-18 DIAGNOSIS — E7800 Pure hypercholesterolemia, unspecified: Secondary | ICD-10-CM | POA: Diagnosis present

## 2021-04-18 DIAGNOSIS — I129 Hypertensive chronic kidney disease with stage 1 through stage 4 chronic kidney disease, or unspecified chronic kidney disease: Secondary | ICD-10-CM | POA: Diagnosis present

## 2021-04-18 DIAGNOSIS — I10 Essential (primary) hypertension: Secondary | ICD-10-CM | POA: Diagnosis not present

## 2021-04-18 DIAGNOSIS — Z880 Allergy status to penicillin: Secondary | ICD-10-CM | POA: Diagnosis not present

## 2021-04-18 DIAGNOSIS — N183 Chronic kidney disease, stage 3 unspecified: Secondary | ICD-10-CM | POA: Diagnosis present

## 2021-04-18 DIAGNOSIS — G319 Degenerative disease of nervous system, unspecified: Secondary | ICD-10-CM | POA: Diagnosis not present

## 2021-04-18 DIAGNOSIS — R29898 Other symptoms and signs involving the musculoskeletal system: Secondary | ICD-10-CM

## 2021-04-18 DIAGNOSIS — F17220 Nicotine dependence, chewing tobacco, uncomplicated: Secondary | ICD-10-CM | POA: Diagnosis present

## 2021-04-18 DIAGNOSIS — N179 Acute kidney failure, unspecified: Secondary | ICD-10-CM | POA: Diagnosis not present

## 2021-04-18 DIAGNOSIS — H269 Unspecified cataract: Secondary | ICD-10-CM | POA: Diagnosis present

## 2021-04-18 DIAGNOSIS — Z888 Allergy status to other drugs, medicaments and biological substances status: Secondary | ICD-10-CM

## 2021-04-18 DIAGNOSIS — K8020 Calculus of gallbladder without cholecystitis without obstruction: Secondary | ICD-10-CM | POA: Diagnosis not present

## 2021-04-18 DIAGNOSIS — Z8619 Personal history of other infectious and parasitic diseases: Secondary | ICD-10-CM

## 2021-04-18 LAB — COMPREHENSIVE METABOLIC PANEL
ALT: 17 U/L (ref 0–44)
AST: 35 U/L (ref 15–41)
Albumin: 3.1 g/dL — ABNORMAL LOW (ref 3.5–5.0)
Alkaline Phosphatase: 86 U/L (ref 38–126)
Anion gap: 9 (ref 5–15)
BUN: 12 mg/dL (ref 8–23)
CO2: 28 mmol/L (ref 22–32)
Calcium: 9.1 mg/dL (ref 8.9–10.3)
Chloride: 100 mmol/L (ref 98–111)
Creatinine, Ser: 1.22 mg/dL (ref 0.61–1.24)
GFR, Estimated: 57 mL/min — ABNORMAL LOW (ref >60)
Glucose, Bld: 110 mg/dL — ABNORMAL HIGH (ref 70–99)
Potassium: 5 mmol/L (ref 3.5–5.1)
Sodium: 137 mmol/L (ref 135–145)
Total Bilirubin: 0.6 mg/dL (ref 0.3–1.2)
Total Protein: 6.7 g/dL (ref 6.5–8.1)

## 2021-04-18 LAB — URINALYSIS, ROUTINE W REFLEX MICROSCOPIC
Bilirubin Urine: NEGATIVE
Glucose, UA: NEGATIVE mg/dL
Hgb urine dipstick: NEGATIVE
Ketones, ur: NEGATIVE mg/dL
Leukocytes,Ua: NEGATIVE
Nitrite: NEGATIVE
Protein, ur: NEGATIVE mg/dL
Specific Gravity, Urine: 1.032 — ABNORMAL HIGH (ref 1.005–1.030)
pH: 6 (ref 5.0–8.0)

## 2021-04-18 LAB — CBC
HCT: 36.4 % — ABNORMAL LOW (ref 39.0–52.0)
Hemoglobin: 11 g/dL — ABNORMAL LOW (ref 13.0–17.0)
MCH: 27 pg (ref 26.0–34.0)
MCHC: 30.2 g/dL (ref 30.0–36.0)
MCV: 89.4 fL (ref 80.0–100.0)
Platelets: 290 10*3/uL (ref 150–400)
RBC: 4.07 MIL/uL — ABNORMAL LOW (ref 4.22–5.81)
RDW: 19.5 % — ABNORMAL HIGH (ref 11.5–15.5)
WBC: 10.9 10*3/uL — ABNORMAL HIGH (ref 4.0–10.5)
nRBC: 0 % (ref 0.0–0.2)

## 2021-04-18 LAB — PROTIME-INR
INR: 1 (ref 0.8–1.2)
Prothrombin Time: 13.7 seconds (ref 11.4–15.2)

## 2021-04-18 LAB — I-STAT CHEM 8, ED
BUN: 14 mg/dL (ref 8–23)
Calcium, Ion: 1.13 mmol/L — ABNORMAL LOW (ref 1.15–1.40)
Chloride: 102 mmol/L (ref 98–111)
Creatinine, Ser: 1.2 mg/dL (ref 0.61–1.24)
Glucose, Bld: 105 mg/dL — ABNORMAL HIGH (ref 70–99)
HCT: 37 % — ABNORMAL LOW (ref 39.0–52.0)
Hemoglobin: 12.6 g/dL — ABNORMAL LOW (ref 13.0–17.0)
Potassium: 5 mmol/L (ref 3.5–5.1)
Sodium: 138 mmol/L (ref 135–145)
TCO2: 28 mmol/L (ref 22–32)

## 2021-04-18 LAB — DIFFERENTIAL
Abs Immature Granulocytes: 0.06 10*3/uL (ref 0.00–0.07)
Basophils Absolute: 0.1 10*3/uL (ref 0.0–0.1)
Basophils Relative: 1 %
Eosinophils Absolute: 0.4 10*3/uL (ref 0.0–0.5)
Eosinophils Relative: 4 %
Immature Granulocytes: 1 %
Lymphocytes Relative: 31 %
Lymphs Abs: 3.4 10*3/uL (ref 0.7–4.0)
Monocytes Absolute: 1.3 10*3/uL — ABNORMAL HIGH (ref 0.1–1.0)
Monocytes Relative: 12 %
Neutro Abs: 5.6 10*3/uL (ref 1.7–7.7)
Neutrophils Relative %: 51 %

## 2021-04-18 LAB — VITAMIN B12: Vitamin B-12: 692 pg/mL (ref 180–914)

## 2021-04-18 LAB — APTT: aPTT: 37 seconds — ABNORMAL HIGH (ref 24–36)

## 2021-04-18 LAB — CBG MONITORING, ED: Glucose-Capillary: 100 mg/dL — ABNORMAL HIGH (ref 70–99)

## 2021-04-18 LAB — TSH: TSH: 1.05 u[IU]/mL (ref 0.350–4.500)

## 2021-04-18 LAB — AMMONIA: Ammonia: 10 umol/L (ref 9–35)

## 2021-04-18 MED ORDER — IOHEXOL 350 MG/ML SOLN
100.0000 mL | Freq: Once | INTRAVENOUS | Status: AC | PRN
  Administered 2021-04-18: 100 mL via INTRAVENOUS

## 2021-04-18 MED ORDER — LORAZEPAM 2 MG/ML IJ SOLN
0.5000 mg | Freq: Once | INTRAMUSCULAR | Status: AC
  Administered 2021-04-18: 0.5 mg via INTRAVENOUS
  Filled 2021-04-18: qty 1

## 2021-04-18 MED ORDER — SODIUM CHLORIDE 0.9% FLUSH
3.0000 mL | Freq: Once | INTRAVENOUS | Status: AC
Start: 2021-04-18 — End: 2021-04-18
  Administered 2021-04-18: 3 mL via INTRAVENOUS

## 2021-04-18 MED ORDER — LORAZEPAM 2 MG/ML IJ SOLN: 1.0000 mg | Freq: Once | INTRAMUSCULAR | Status: AC

## 2021-04-18 MED ORDER — LORAZEPAM 2 MG/ML IJ SOLN
INTRAMUSCULAR | Status: AC
  Administered 2021-04-18: 0.5 mg via INTRAVENOUS
  Filled 2021-04-18: qty 1

## 2021-04-18 NOTE — Code Documentation (Signed)
Stroke Response Nurse Documentation  Code Documentation  Leon Estes is a 87 y.o. male arriving to [facility] ED via [facility] on 9/6 with past medical hx of CAD, HLD, mass to brain, HTN, COPD, and HOH. On aspirin 81 mg daily. Code stroke was activated by EMS.   Patient from home where he was LKW at 1530 and now complaining of Left facial droop and aphasia.    Stroke team at the bedside on patient arrival. Labs drawn and patient cleared for CT by Dr. Jeanell Sparrow. Patient to CT with team. NIHSS 12, see documentation for details and code stroke times. Patient with disoriented, left gaze preference , left facial droop, bilateral leg weakness, left decreased sensation, Global aphasia , dysarthria , and right neglect on exam. The following imaging was completed:  CT, CTA head and neck, CTP, MRI. No LVO. Patient is not a candidate for IV Thrombolytic due to Out of window.   Bedside handoff with ED RN Janett Billow.    Madelynn Done  Rapid Response RN

## 2021-04-18 NOTE — ED Triage Notes (Signed)
Pt comes from home via [facility], LSN 330pm, went to his neighbors house and was aphasic, abnormal gait, AMS and L sided facial droop.

## 2021-04-18 NOTE — ED Notes (Signed)
Pt agitated in MRI, very confused, fighting and attempted to crawl off the table, called for orders

## 2021-04-18 NOTE — Consult Note (Signed)
Neurology Consultation  Reason for Consult: Code stroke for aphasia and left-sided weakness Referring Physician: Dr. Pattricia Boss  CC: Aphasia, left-sided weakness  History is obtained from: Chart, EMS  HPI: Leon Estes is a 87 y.o. male past medical history of asthma, COPD, coronary artery disease, hypertension, hyperlipidemia, hearing loss, recent diagnosis of a CP angle mass after he had been evaluated for complaints of headaches and worsening hearing loss on the left-?  Schwannoma, last known well at 3:30 PM today when he went out with a neighbor to watch deer as he does on most afternoons and was noted to be acting confused and having trouble walking.  EMS was called and they noted that he had a left facial weakness along with inability to follow commands or talk normally.  He also noted while they were putting him on the stretcher that he had shuffling gait.  Patient is unable to provide any history.  No family member at bedside.  Had a recent visit to the ER for evaluation of headache.  Imaging was concerning for a left CP angle mass likely schwannoma with cystic appearance for which he was recommended outpatient neurosurgical follow-up after initial inpatient neurological consultation with Dr. Venetia Constable.  MR brain with and without contrast and MR IAC showed a cystic and solid enhancing 3.6 cm mass centered in the left CP angle most likely a cisternal schwannoma.  The mass abuts but does not extend into the IAC.  Mild mass-effect on the adjacent pons, middle cerebellar peduncle and cerebellum.    -- Able to talk wife over the phone around 10:45 PM.  She reports that at baseline, he has extremely poor visual acuity in both eyes.  He does not drive.  Does not do his own bills.  Can barely see enough to get out of the house.  Cannot read.  Today he seemed off in the sense that he could not recognize his daughters and son-in-law who had come to visit and also felt that the friend with him  who he was outside feeding the deer, thought that he was crazy because he could not carry out a conversation with him.  He has had a similar spell where he became confused a few weeks ago that self resolved.  LKW: 3:30 PM tpa given?: no, outside the window Premorbid modified Rankin scale (mRS): Unable to reliably ascertain due to his mentation ROS: Unable to reliably ascertain due to his mentation  Past Medical History:  Diagnosis Date   Asthma    BPH (benign prostatic hypertrophy)    Cataract    Chronic renal disease, stage III (HCC)    Chronic sinusitis    COPD (chronic obstructive pulmonary disease) (HCC)    Coronary artery disease    2 stents   Gall stones    Hearing loss    DOES NOT WEAR HEARING AIDS   High cholesterol    Hypertension    Macular degeneration    right eye    Family History  Problem Relation Age of Onset   Diabetes Father    Heart disease Brother      Social History:   reports that he quit smoking about 15 years ago. His smoking use included cigarettes. His smokeless tobacco use includes chew. He reports that he does not drink alcohol and does not use drugs.  Medications No current facility-administered medications for this encounter.  Current Outpatient Medications:    aspirin EC 81 MG tablet, Take 81 mg by mouth daily.,  Disp: , Rfl:    budesonide-formoterol (SYMBICORT) 160-4.5 MCG/ACT inhaler, INHALE 2 PUFFS INTO THE LUNGS TWICE DAILY (Patient taking differently: Inhale 2 puffs into the lungs in the morning and at bedtime.), Disp: 10.2 g, Rfl: 5   gabapentin (NEURONTIN) 300 MG capsule, Take 300 mg by mouth at bedtime., Disp: , Rfl:    Multiple Vitamin (MULTIVITAMIN WITH MINERALS) TABS tablet, Take 1 tablet by mouth daily., Disp: , Rfl:    pantoprazole (PROTONIX) 40 MG tablet, TAKE 1 TABLET BY MOUTH ONCE A DAY (Patient taking differently: Take 40 mg by mouth daily.), Disp: 90 tablet, Rfl: 3   potassium chloride (KLOR-CON) 10 MEQ tablet, Take 1 tablet  (10 mEq total) by mouth daily., Disp: 90 tablet, Rfl: 3   simvastatin (ZOCOR) 40 MG tablet, Take 40 mg by mouth every evening., Disp: , Rfl:    tamsulosin (FLOMAX) 0.4 MG CAPS capsule, TAKE 1 CAPSULE BY MOUTH ONCE DAILY (Patient taking differently: Take 0.4 mg by mouth daily.), Disp: 90 capsule, Rfl: 3   traMADol (ULTRAM) 50 MG tablet, Take 50 mg by mouth 2 (two) times daily as needed for moderate pain., Disp: , Rfl:    vancomycin (VANCOCIN) 125 MG capsule, Take 1 capsule (125 mg total) by mouth 4 (four) times daily. For 7 days, then take 1 capsule by mouth 3 times daily for 7 days, then take 1 capsule by mouth 2 times daily for 7 days and then take 1 capsule by mouth once daily for 7 days., Disp: 70 capsule, Rfl: 0   Exam: Current vital signs: BP 118/69   Pulse 86   Temp 98.2 F (36.8 C) (Temporal)   Resp 16   SpO2 95%  Vital signs in last 24 hours: Temp:  [98.2 F (36.8 C)] 98.2 F (36.8 C) (09/06 2244) Pulse Rate:  [86-95] 86 (09/06 2300) Resp:  [13-16] 16 (09/06 2300) BP: (118-150)/(68-69) 118/69 (09/06 2300) SpO2:  [95 %-98 %] 95 % (09/06 2300) Vital signs stable General: Awake alert in no distress HEENT: Normocephalic/atraumatic Lungs: Clear Cardiovascular: Regular rate rhythm Abdomen nondistended nontender Extremities warm well perfused, trace edema in both extremities worse on right Neurological exam He is awake alert Is able to tell me his name Speech is dysarthric He is very hard of hearing-difficult to ascertain whether limitation in following commands is due to his hearing loss versus true aphasia but he was not able to follow all commands consistently. Unable to get him to repeat. Returns to neglect the right side with visual attention focused to the left. Cranial nerves: Pupils equal round reactive to light, extraocular movements appear unhindered but he seems to be having a hard time looking to the right and prefers to look at the left.  Inconsistent blink to  threat.  Face at rest appears symmetric.  He did not smile but as he was trying to talk with the facial muscles engaged, no gross asymmetry observed.  Unable to ascertain tongue strength. Motor exam: Able to raise both upper extremities antigravity without drift.  Unable to raise right leg against gravity.  Able to raise left leg against gravity 5/5. Sensory exam: Intact to noxious stimulation in all fours Coordination: Difficult to assess given his limited ability to follow NIH stroke scale on arrival-12  Labs I have reviewed labs in epic and the results pertinent to this consultation are:  CBC    Component Value Date/Time   WBC 10.9 (H) 04/18/2021 2102   RBC 4.07 (L) 04/18/2021 2102   HGB  12.6 (L) 04/18/2021 2113   HGB 12.2 (L) 04/29/2013 1543   HCT 37.0 (L) 04/18/2021 2113   HCT 36.9 (L) 04/29/2013 1543   PLT 290 04/18/2021 2102   PLT 253 04/29/2013 1543   MCV 89.4 04/18/2021 2102   MCV 87 04/29/2013 1543   MCH 27.0 04/18/2021 2102   MCHC 30.2 04/18/2021 2102   RDW 19.5 (H) 04/18/2021 2102   RDW 15.0 (H) 04/29/2013 1543   LYMPHSABS 3.4 04/18/2021 2102   MONOABS 1.3 (H) 04/18/2021 2102   EOSABS 0.4 04/18/2021 2102   BASOSABS 0.1 04/18/2021 2102    CMP     Component Value Date/Time   NA 138 04/18/2021 2113   NA 135 (L) 04/29/2013 1543   K 5.0 04/18/2021 2113   K 3.3 (L) 04/29/2013 1543   CL 102 04/18/2021 2113   CL 102 04/29/2013 1543   CO2 28 04/18/2021 2102   CO2 27 04/29/2013 1543   GLUCOSE 105 (H) 04/18/2021 2113   GLUCOSE 120 (H) 04/29/2013 1543   BUN 14 04/18/2021 2113   BUN 17 04/29/2013 1543   CREATININE 1.20 04/18/2021 2113   CREATININE 2.47 (H) 09/09/2020 1343   CALCIUM 9.1 04/18/2021 2102   CALCIUM 9.8 04/29/2013 1543   PROT 6.7 04/18/2021 2102   PROT 7.9 04/29/2013 1543   ALBUMIN 3.1 (L) 04/18/2021 2102   ALBUMIN 3.8 04/29/2013 1543   AST 35 04/18/2021 2102   AST 30 04/29/2013 1543   ALT 17 04/18/2021 2102   ALT 24 04/29/2013 1543   ALKPHOS 86  04/18/2021 2102   ALKPHOS 79 04/29/2013 1543   BILITOT 0.6 04/18/2021 2102   BILITOT 0.4 04/29/2013 1543   GFRNONAA 57 (L) 04/18/2021 2102   GFRNONAA >60 04/29/2013 1543   GFRAA 42 (L) 05/04/2017 1945   GFRAA >60 04/29/2013 1543    Imaging I have reviewed the images obtained:  CT-head-no acute changes.  Aspects 10 CTA head and neck with no emergent LVO  Assessment:  87 year old man with above past medical history presenting with sudden onset of difficulty walking and ability to follow commands and also questionable left-sided facial weakness noted by EMS in the field. Outside the window for tPA when arrived in the ER.  Has somewhat of a puzzling exam for me.  He was unable to consistently follow commands, he seems to have preference for his left side and neglect for the right, he has right leg weakness. The schwannoma if compressing the brainstem can produce some crust findings but neglect would not be expected.  I suspect that his visual and auditory loss is making his exam challenging as well as he probably because of his visual loss has anosgnosia and his confusion with words is likely confabulation again that can be seen with people who have blindness-Anton syndrome.  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  I am still not sure how to explain his right leg weakness or neglect.  Imaging negative for emergent LVO Outside the window for IV tPA  Recommendations: Stat MRI of the brain without contrast to rule out stroke I doubt that he is having seizures from the CP angle mass-would not typically expect seizures emanating from that location but having had a similar spell in the past makes me wonder if there is underlying neurodegeneration  making him more susceptible for  seizures.  An EEG should be considered. Chest x-ray, UA Drug screen B12, TSH, ammonia I would imagine he would benefit from an observation admission and continued observation while the tests above return.  Plan discussed with Dr. Yolande Jolly follow.  -- Amie Portland, MD Neurologist [facility] Pager: [phone]   Addendum Preliminary review of the MRI is negative for stroke. Formal read below IMPRESSION: 1. No acute intracranial abnormality. 2. Stable partially solid/partially cystic mass at the left CP angle cistern with localized mass effect. 3. Age-related cerebral atrophy with chronic microvascular ischemic disease. 4. Scattered chronic hemosiderin staining involving both cerebral hemispheres, suggesting prior subarachnoid hemorrhage, stable. We will follow.  -- Amie Portland, MD Neurologist [facility] Pager: [phone]

## 2021-04-18 NOTE — ED Provider Notes (Signed)
[facility] EMERGENCY DEPARTMENT Provider Note   CSN: [id number] Arrival date & time: 04/18/21  2100     History No chief complaint on file.   Leon Estes is a 87 y.o. male.  HPI Level 66 caveat 87 year old male presents today via EMS as code stroke.  Last known normal reported 330.  They report that he normally goes out to look at the deer every day.  Neighbors called EMS and stated that he was acting abnormal.  EMS reports that family stated he had a facial droop.  Review of records show that he recently was seen in the ED on 04/03/2021 and at that time was noted had a head CT which showed a mass in his cerebellar pontine area.  Neurosurgery was consulted and evaluated the patient in the ED with plans for outpatient follow-up. Review of outpatient notes revealed that he was seen by his primary care physician, Dr. Silvio Pate, this was felt to likely be a schwannoma with some mass-effect.  He noted that the patient has had problems with his balance and falling.  He was to see the neurosurgeon in follow-up in 2 weeks from 826.  It is unclear if he has been followed up as an outpatient neurosurgery office yet or not.  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   Delirium 02/08/2021   Goals of care, counseling/discussion    Colitis due to Clostridioides difficile  12/31/2020   HOH (hard of hearing) 12/28/2020   Acute on chronic cholecystitis 12/28/2020   Arrhythmia 12/06/2020   Pulmonary nodule 12/06/2020   Acute cholecystitis 11/26/2020   Preventative health care 10/12/2020   Macular degeneration, wet ([place]) 10/12/2020   Acquired claw toe, left 02/02/2020   BPH (benign prostatic hyperplasia) 01/13/2020   Neuropathy 02/18/2018   COPD (chronic obstructive pulmonary disease) ([place])    Chronic renal disease, stage III ([place])    Benign essential HTN 04/09/2017   CAD (coronary artery disease) 03/03/2015   Hyperlipidemia 03/03/2015    Past Surgical History:  Procedure Laterality Date   APPENDECTOMY     CORONARY ANGIOPLASTY WITH STENT PLACEMENT  1998   2 stents   TRANSURETHRAL RESECTION OF PROSTATE         Family History  Problem Relation Age of Onset   Diabetes Father    Heart disease Brother     Social History   Tobacco Use   Smoking status: Former    Types: Cigarettes    Quit date: 08/13/2005    Years since quitting: 15.6   Smokeless tobacco: Current    Types: Chew   Tobacco comments:    discussed stopping this (only once in a while)  Vaping Use   Vaping Use: Never used  Substance Use Topics   Alcohol  use: No   Drug use: No    Home Medications Prior to Admission medications   Medication Sig Start Date End Date Taking? Authorizing Provider  aspirin EC 81 MG tablet Take 81 mg by mouth daily.    [provider]  budesonide-formoterol (SYMBICORT) 160-4.5 MCG/ACT inhaler INHALE 2 PUFFS INTO THE LUNGS TWICE DAILY Patient taking differently: Inhale 2 puffs into the lungs in the morning and at bedtime. 09/28/20   Venia Carbon, MD  gabapentin (NEURONTIN) 300 MG capsule Take 300 mg by mouth at bedtime.    [provider]  Multiple Vitamin (MULTIVITAMIN WITH MINERALS) TABS tablet Take 1 tablet by mouth daily. 01/23/21   Georgette Shell, MD  pantoprazole (PROTONIX) 40 MG tablet TAKE 1 TABLET BY MOUTH ONCE A  DAY Patient taking differently: Take 40 mg by mouth daily. 08/15/20   Venia Carbon, MD  potassium chloride (KLOR-CON) 10 MEQ tablet Take 1 tablet (10 mEq total) by mouth daily. 03/15/21   Venia Carbon, MD  simvastatin (ZOCOR) 40 MG tablet Take 40 mg by mouth every evening.    [provider]  tamsulosin (FLOMAX) 0.4 MG CAPS capsule TAKE 1 CAPSULE BY MOUTH ONCE DAILY Patient taking differently: Take 0.4 mg by mouth daily. 07/14/20   Venia Carbon, MD  traMADol (ULTRAM) 50 MG tablet Take 50 mg by mouth 2 (two) times daily as needed for moderate pain. 08/15/17   [provider]  vancomycin (VANCOCIN) 125 MG capsule Take 1 capsule (125 mg total) by mouth 4 (four) times daily. For 7 days, then take 1 capsule by mouth 3 times daily for 7 days, then take 1 capsule by mouth 2 times daily for 7 days and then take 1 capsule by mouth once daily for 7 days. 03/06/21   Tower, Wynelle Fanny, MD    Allergies    Sulfa antibiotics, Benzalkonium chloride, Neosporin [neomycin-bacitracin zn-polymyx], Augmentin [amoxicillin-pot clavulanate], Terbinafine, and Terbinafine and related  Review of Systems   Review of Systems  Unable to perform ROS: Acuity of condition   Physical Exam Updated Vital Signs There were no vitals taken for this visit.  Physical Exam HENT:     Head: Normocephalic and atraumatic.     Right Ear: External ear normal.     Left Ear: External ear normal.     Nose: Nose normal.  Eyes:     Comments: Patient has preferred gaze to left and appears to have some disconjugate gaze  Cardiovascular:     Rate and Rhythm: Normal rate and regular rhythm.  Pulmonary:     Effort: Pulmonary effort is normal.  Abdominal:     Palpations: Abdomen is soft.  Musculoskeletal:     Cervical back: Normal range of motion.  Skin:    General: Skin is warm and dry.  Neurological:     Mental Status: He is alert.     Comments: Patient has right-sided neglect Right arm appears to have normal  strength against gravity Patient has movement of right leg Speech appears slightly garbled He is unable to name any objects    ED Results / Procedures / Treatments   Labs (all labs ordered are listed, but only abnormal results are displayed) Labs Reviewed  CBG MONITORING, ED - Abnormal; Notable for the following components:      Result Value   Glucose-Capillary 100 (*)    All other components within normal limits  RESP PANEL BY RT-PCR (FLU A&B, COVID) [id number]  PROTIME-INR  APTT  CBC  DIFFERENTIAL  COMPREHENSIVE METABOLIC PANEL  I-STAT CHEM 8, ED    EKG EKG Interpretation  Date/Time:  Tuesday April 18 2021 22:43:41 EDT Ventricular Rate:  96 PR Interval:  156 QRS Duration: 142 QT Interval:  404 QTC Calculation: 511 R Axis:   83 Text Interpretation: Sinus or ectopic atrial rhythm Ventricular premature complex Right bundle branch block Probable anterolateral infarct, old Confirmed by Pattricia Boss [phone]) on 04/18/2021 11:01:28 PM  Radiology MR BRAIN WO CONTRAST  Result Date: 04/18/2021 CLINICAL DATA:  Initial evaluation for neuro deficit, stroke suspected. EXAM: MRI HEAD WITHOUT CONTRAST TECHNIQUE: Multiplanar, multiecho pulse sequences of the brain and surrounding structures were obtained without intravenous contrast. COMPARISON:  Prior studies from earlier the same day as well as recent MRI from 04/03/2021. FINDINGS: Brain: Age-related cerebral atrophy with chronic microvascular ischemic disease. Remote lacunar type infarct within the posterior right corona radiata. No abnormal foci of restricted diffusion to suggest acute or subacute ischemia. Gray-white matter differentiation maintained. No encephalomalacia to suggest chronic cortical infarction. No acute intracranial hemorrhage. Multifocal areas of scattered chronic hemosiderin staining noted involving both cerebral hemispheres, suggesting prior subarachnoid hemorrhage, stable from prior. Partially solid/partially cystic mass  positioned at the left CP angle cistern again noted, stable. Mild mass effect on the adjacent left brainstem and cerebellum without significant vasogenic edema, stable. No other mass lesion or mass effect. No midline shift. Stable ventricular size without hydrocephalus. No extra-axial fluid collection. Pituitary gland and suprasellar region within normal limits. Midline structures intact. Vascular: Major intracranial vascular flow voids are maintained. Skull and upper cervical spine: Craniocervical junction within normal limits. Bone marrow signal intensity normal. No scalp soft tissue abnormality. Sinuses/Orbits: Prior ocular lens replacement on the right. Globes and orbital soft tissues demonstrate no acute finding. Scattered mucosal thickening noted within the ethmoidal air cells. Possible sinonasal polyposis again noted. Small right mastoid effusion noted, of doubtful significance. Other: None. IMPRESSION: 1. No acute intracranial abnormality. 2. Stable partially solid/partially cystic mass at the left CP angle cistern with localized mass effect. 3. Age-related cerebral atrophy with chronic microvascular ischemic disease. 4. Scattered chronic hemosiderin staining involving both cerebral hemispheres, suggesting prior subarachnoid hemorrhage, stable. Electronically Signed   By: Jeannine Boga M.D.   On: 04/18/2021 23:08   CT HEAD CODE STROKE WO CONTRAST  Result Date: 04/18/2021 CLINICAL DATA:  Code stroke. Initial evaluation for acute left facial droop. EXAM: CT HEAD WITHOUT CONTRAST TECHNIQUE: Contiguous axial images were obtained from the base of the skull through the vertex without intravenous contrast. COMPARISON:  Prior studies from 04/03/2021. FINDINGS: Brain: Age-related cerebral atrophy with chronic microvascular ischemic disease. No acute intracranial hemorrhage. No acute large vessel territory infarct. Mixed solid and cystic mass at the left CP angle cistern again noted, stable. No other mass  effect or midline shift. No hydrocephalus or extra-axial fluid collection. Vascular: No hyperdense vessel. Calcified atherosclerosis present at skull base. Skull: Scalp soft tissues and calvarium within normal limits. Sinuses/Orbits: Globes and orbital soft tissues demonstrate no acute finding. Senescent calcifications noted. Chronic mucoperiosteal thickening with possible sinonasal polyposis noted about the ethmoidal air cells and nasal cavities. Mastoid air cells are clear. Other: None. ASPECTS [facility] Stroke Program Early CT Score) - Ganglionic level infarction (caudate, lentiform nuclei, internal capsule, insula, M1-M3 cortex): 7 - Supraganglionic infarction (M4-M6 cortex): 3 Total score (0-10 with 10 being normal): 10 IMPRESSION: 1. No acute intracranial abnormality. 2. ASPECTS is 10. 3. Stable partially solid/partially cystic left CP angle cistern mass with localized  mass effect. 4. Underlying atrophy with chronic small vessel ischemic disease. 5. Question sinonasal polyposis. These results were communicated to Dr. Rory Percy at 9:20 pm on 04/18/2021 by text page via the [facility] messaging system. Electronically Signed   By: Jeannine Boga M.D.   On: 04/18/2021 21:21   CT ANGIO HEAD NECK W WO CM W PERF (CODE STROKE)  Result Date: 04/18/2021 CLINICAL DATA:  Initial evaluation for acute stroke. EXAM: CT ANGIOGRAPHY HEAD AND NECK CT PERFUSION BRAIN TECHNIQUE: Multidetector CT imaging of the head and neck was performed using the standard protocol during bolus administration of intravenous contrast. Multiplanar CT image reconstructions and MIPs were obtained to evaluate the vascular anatomy. Carotid stenosis measurements (when applicable) are obtained utilizing NASCET criteria, using the distal internal carotid diameter as the denominator. Multiphase CT imaging of the brain was performed following IV bolus contrast injection. Subsequent parametric perfusion maps were calculated using RAPID software. CONTRAST:   161m OMNIPAQUE IOHEXOL 350 MG/ML SOLN COMPARISON:  Prior CT from earlier the same day. FINDINGS: CTA NECK FINDINGS Aortic arch: Visualized aortic arch normal in caliber with normal 3 vessel morphology. Moderate atheromatous change about the visualized aorta and origin of the great vessels without hemodynamically significant stenosis. Right carotid system: Right CCA patent from its origin to the bifurcation without stenosis. Bulky calcified plaque about the right carotid bulb/proximal right ICA with mild 25% stenosis by NASCET criteria. Right ICA patent distally without stenosis, dissection or occlusion. Left carotid system: Left CCA patent from its origin to the bifurcation without stenosis. Scattered plaque about the left bifurcation/proximal left ICA without significant stenosis. Left ICA patent distally without stenosis, dissection or occlusion. Vertebral arteries: Both vertebral arteries arise from the subclavian arteries. Atheromatous change at the origins of both vertebral arteries without significant stenosis. Vertebral arteries patent without stenosis, dissection or occlusion. Skeleton: No visible acute osseous finding. No discrete osseous lesions. Patient is edentulous. Other neck: No other acute soft tissue abnormality within the neck. No mass or adenopathy. Upper chest: Upper lobe predominant centrilobular emphysema. Review of the MIP images confirms the above findings CTA HEAD FINDINGS Anterior circulation: Petrous segments patent bilaterally. Mild atheromatous change within the carotid siphons without significant stenosis. A1 segments patent bilaterally. Normal anterior communicating artery complex. Anterior cerebral arteries patent to their distal aspects without stenosis. No M1 high-grade stenosis or occlusion. Mild stenosis noted at the distal left M1 segment/left MCA bifurcation. Distal MCA branches well perfused and symmetric. Posterior circulation: Both V4 segments patent to the vertebrobasilar  junction without stenosis. Left vertebral artery dominant. Left PICA patent. Right PICA not seen. Basilar patent to its distal aspect without stenosis. Superior cerebral arteries patent bilaterally. Both PCAs primarily supplied via the basilar well perfused or distal aspects. Venous sinuses: Grossly patent allowing for timing the contrast bolus. Anatomic variants: None significant.  No aneurysm. Review of the MIP images confirms the above findings CT Brain Perfusion Findings: ASPECTS: 10 CBF (<30%) Volume: 052mPerfusion (Tmax>6.0s) volume: 59m959mismatch Volume: 59mL38mfarction Location:Negative CT perfusion with no evidence for acute ischemia or other perfusion deficit. IMPRESSION: 1. Negative CTA for emergent large vessel occlusion. 2. Negative CT perfusion with no evidence for acute ischemia or other perfusion deficit. 3. Atheromatous change about the right carotid bulb/proximal right ICA with mild 25% stenosis by NASCET criteria. 4. Mild atheromatous change elsewhere about the major arterial vasculature of the head and neck as above. No other hemodynamically significant or correctable stenosis. 5. Emphysema (ICD10-J43.9). Results discussed by telephone at the time  of interpretation on 04/18/2021 at 9:30 pm to provider [facility] , who verbally acknowledged these results. Electronically Signed   By: Jeannine Boga M.D.   On: 04/18/2021 21:55    Procedures .Critical Care  Date/Time: 04/18/2021 11:46 PM Performed by: Pattricia Boss, MD Authorized by: Pattricia Boss, MD   .Critical Care  Date/Time: 04/18/2021 11:46 PM Performed by: Pattricia Boss, MD Authorized by: Pattricia Boss, MD   Critical care provider statement:    Critical care time (minutes):  45   Critical care end time:  04/18/2021 11:46 PM   Critical care was necessary to treat or prevent imminent or life-threatening deterioration of the following conditions:  CNS failure or compromise   Critical care was time spent personally by me on the  following activities:  Discussions with consultants, evaluation of patient's response to treatment, examination of patient, ordering and performing treatments and interventions, ordering and review of laboratory studies, ordering and review of radiographic studies, pulse oximetry, re-evaluation of patient's condition, obtaining history from patient or surrogate and review of old charts   Medications Ordered in ED Medications  sodium chloride flush (NS) 0.9 % injection 3 mL (has no administration in time range)    ED Course  I have reviewed the triage vital signs and the nursing notes.  Pertinent labs & imaging results that were available during my care of the patient were reviewed by me and considered in my medical decision making (see chart for details). 87 yo male presented today with asthma, copd, cad, hypertension hyperlipidemia, recent cerebellar mass with neurosurgery consult and plan to follow as op. Patient seen as code stroke, seen with Dr. Malen Gauze who has seen and evaluated with ct, cta and mri.  No evidence of acute stroke. Per neurology- doubt seizure from cp angle mass, and unclear etiology of exam.   Patient has had increasing confusion- question some sundowning.   Unclear etiology of patient's symptoms today or tonight. Please see neurology note. Labs with mild anemia, o/w labs  Care transferred to Dr. Ralene Bathe, who will speak with hospitalist for admission    MDM Rules/Calculators/A&P                            Final Clinical Impression(s) / ED Diagnoses Final diagnoses:  AMS (altered mental status)  Weakness of right lower extremity  Blindness and low vision    Rx / DC Orders ED Discharge Orders     None        Pattricia Boss, MD 04/18/21 2347

## 2021-04-19 ENCOUNTER — Observation Stay (HOSPITAL_COMMUNITY): Payer: PPO

## 2021-04-19 DIAGNOSIS — K8020 Calculus of gallbladder without cholecystitis without obstruction: Secondary | ICD-10-CM | POA: Diagnosis not present

## 2021-04-19 DIAGNOSIS — G9341 Metabolic encephalopathy: Secondary | ICD-10-CM | POA: Diagnosis not present

## 2021-04-19 DIAGNOSIS — K819 Cholecystitis, unspecified: Secondary | ICD-10-CM | POA: Diagnosis not present

## 2021-04-19 DIAGNOSIS — D496 Neoplasm of unspecified behavior of brain: Secondary | ICD-10-CM

## 2021-04-19 DIAGNOSIS — R410 Disorientation, unspecified: Secondary | ICD-10-CM | POA: Diagnosis not present

## 2021-04-19 DIAGNOSIS — H5410 Blindness, one eye, low vision other eye, unspecified eyes: Secondary | ICD-10-CM | POA: Diagnosis not present

## 2021-04-19 DIAGNOSIS — G928 Other toxic encephalopathy: Secondary | ICD-10-CM | POA: Diagnosis present

## 2021-04-19 DIAGNOSIS — H9193 Unspecified hearing loss, bilateral: Secondary | ICD-10-CM

## 2021-04-19 LAB — CBC
HCT: 28.6 % — ABNORMAL LOW (ref 39.0–52.0)
Hemoglobin: 10.1 g/dL — ABNORMAL LOW (ref 13.0–17.0)
MCH: 34.1 pg — ABNORMAL HIGH (ref 26.0–34.0)
MCHC: 35.3 g/dL (ref 30.0–36.0)
MCV: 96.6 fL (ref 80.0–100.0)
Platelets: 288 10*3/uL (ref 150–400)
RBC: 2.96 MIL/uL — ABNORMAL LOW (ref 4.22–5.81)
RDW: 12.2 % (ref 11.5–15.5)
WBC: 4.4 10*3/uL (ref 4.0–10.5)
nRBC: 0 % (ref 0.0–0.2)

## 2021-04-19 LAB — RESP PANEL BY RT-PCR (FLU A&B, COVID) ARPGX2
Influenza A by PCR: NEGATIVE
Influenza B by PCR: NEGATIVE
SARS Coronavirus 2 by RT PCR: NEGATIVE

## 2021-04-19 LAB — PROCALCITONIN: Procalcitonin: <0.1 ng/mL

## 2021-04-19 LAB — LACTIC ACID, PLASMA: Lactic Acid, Venous: 1.6 mmol/L (ref 0.5–1.9)

## 2021-04-19 MED ORDER — ACETAMINOPHEN 650 MG RE SUPP: 650.0000 mg | Freq: Four times a day (QID) | RECTAL | Status: DC | PRN

## 2021-04-19 MED ORDER — MOMETASONE FURO-FORMOTEROL FUM 200-5 MCG/ACT IN AERO
2.0000 | INHALATION_SPRAY | Freq: Two times a day (BID) | RESPIRATORY_TRACT | Status: DC
  Administered 2021-04-25 – 2021-04-30 (×10): 2 via RESPIRATORY_TRACT
  Filled 2021-04-19 (×2): qty 8.8

## 2021-04-19 MED ORDER — ONDANSETRON HCL 4 MG PO TABS: 4.0000 mg | ORAL_TABLET | Freq: Four times a day (QID) | ORAL | Status: DC | PRN

## 2021-04-19 MED ORDER — QUETIAPINE FUMARATE 25 MG PO TABS
25.0000 mg | ORAL_TABLET | Freq: Every day | ORAL | Status: DC
  Administered 2021-04-20 – 2021-04-29 (×10): 25 mg via ORAL
  Filled 2021-04-19 (×11): qty 1

## 2021-04-19 MED ORDER — ENOXAPARIN SODIUM 40 MG/0.4ML IJ SOSY: 40.0000 mg | PREFILLED_SYRINGE | Freq: Every day | INTRAMUSCULAR | Status: DC

## 2021-04-19 MED ORDER — ONDANSETRON HCL 4 MG/2ML IJ SOLN: 4.0000 mg | Freq: Four times a day (QID) | INTRAMUSCULAR | Status: DC | PRN

## 2021-04-19 MED ORDER — LACTATED RINGERS IV SOLN: INTRAVENOUS | Status: DC

## 2021-04-19 MED ORDER — PANTOPRAZOLE SODIUM 40 MG PO TBEC
40.0000 mg | DELAYED_RELEASE_TABLET | Freq: Every day | ORAL | Status: DC
  Administered 2021-04-20 – 2021-04-22 (×3): 40 mg via ORAL
  Filled 2021-04-19 (×3): qty 1

## 2021-04-19 MED ORDER — ACETAMINOPHEN 325 MG PO TABS
650.0000 mg | ORAL_TABLET | Freq: Four times a day (QID) | ORAL | Status: DC | PRN
  Administered 2021-04-28: 650 mg via ORAL
  Filled 2021-04-19: qty 2

## 2021-04-19 MED ORDER — ENOXAPARIN SODIUM 40 MG/0.4ML IJ SOSY
40.0000 mg | PREFILLED_SYRINGE | INTRAMUSCULAR | Status: DC
  Administered 2021-04-20 – 2021-04-21 (×2): 40 mg via SUBCUTANEOUS
  Filled 2021-04-19 (×2): qty 0.4

## 2021-04-19 MED ORDER — LORAZEPAM 2 MG/ML IJ SOLN
2.0000 mg | Freq: Once | INTRAMUSCULAR | Status: AC
  Administered 2021-04-19: 2 mg via INTRAVENOUS
  Filled 2021-04-19: qty 1

## 2021-04-19 MED ORDER — HALOPERIDOL LACTATE 5 MG/ML IJ SOLN
1.0000 mg | Freq: Four times a day (QID) | INTRAMUSCULAR | Status: DC | PRN
  Administered 2021-04-20 – 2021-04-23 (×5): 2 mg via INTRAVENOUS
  Filled 2021-04-19 (×5): qty 1

## 2021-04-19 MED ORDER — TAMSULOSIN HCL 0.4 MG PO CAPS
0.4000 mg | ORAL_CAPSULE | Freq: Every day | ORAL | Status: DC
  Administered 2021-04-20 – 2021-04-29 (×10): 0.4 mg via ORAL
  Filled 2021-04-19 (×10): qty 1

## 2021-04-19 NOTE — Progress Notes (Signed)
Per RN, unable to get CT AP as patient apparently wakes up and fights when ever anyone tries to roll him on to his back.  Pt seems to want to sleep on his L side.  Will see if they can ultrasound the RUQ instead to get a look at the gallbladder.

## 2021-04-19 NOTE — Progress Notes (Signed)
Patient very agitated, combative and uncooperative. He is attempting to climb out of bed and has removed his IV. Patient has been medicated. RN will reassess and continue to monitor.

## 2021-04-19 NOTE — ED Notes (Signed)
EEG at bedside.

## 2021-04-19 NOTE — Progress Notes (Signed)
Leon Estes  [id number] DOB: 1931-02-05 DOA: 04/18/2021 PCP: Venia Carbon, MD    Brief Narrative:  87 year old with a history of COPD, CAD, Schwanoma, HTN, HLD, severe visual loss, HOH, recent acute on chronic cholecystitis, and recurrent [name] episodes who was brought to the ER after he developed altered mental status/confusion.  In the ED MRI ruled out the possibility of an acute CVA.  Consultants:  Neurology  Code Status: NO CODE BLUE  Antimicrobials:  None  DVT prophylaxis: Lovenox  Subjective: Patient was examined and interviewed by one of my partners earlier today.  Assessment & Plan:  Acute toxic metabolic encephalopathy MR brain without acute findings -TSH normal -UA unrevealing -ammonia and B12 normal -UA unremarkable -no ultrasound findings to suggest acute cholecystitis  HTN Blood pressure reasonably controlled  COPD Well compensated  Schwannoma No evidence of an acute complicating issue  Severe visual loss and hearing loss  Chronic cholecystitis  History of recurrent [name]   Family Communication: Spoke with patient's wife at bedside at length Status is: Observation  The patient remains OBS appropriate and will d/c before 2 midnights.  Dispo: The patient is from: Home              Anticipated d/c is to:  Unclear              Patient currently is not medically stable to d/c.   Difficult to place patient No   Objective: Blood pressure (!) 143/91, pulse 96, temperature 98.5 F (36.9 C), temperature source Axillary, resp. rate (!) 24, SpO2 96 %. No intake or output data in the 24 hours ending 04/19/21 1200 There were no vitals filed for this visit.  Examination: General: No acute respiratory distress -confused/agitated Lungs: Clear to auscultation bilaterally without wheezes or crackles Cardiovascular: Regular rate and rhythm without murmur gallop or rub normal S1 and S2 Abdomen: Nontender, nondistended,  soft, bowel sounds positive, no rebound, no ascites, no appreciable mass Extremities: No significant cyanosis, clubbing, or edema bilateral lower extremities  CBC: Recent Labs  Lab 04/18/21 2102 04/18/21 2113  WBC 10.9*  --   NEUTROABS 5.6  --   HGB 11.0* 12.6*  HCT 36.4* 37.0*  MCV 89.4  --   PLT 290  --    Basic Metabolic Panel: Recent Labs  Lab 04/18/21 2102 04/18/21 2113  NA 137 138  K 5.0 5.0  CL 100 102  CO2 28  --   GLUCOSE 110* 105*  BUN 12 14  CREATININE 1.22 1.20  CALCIUM 9.1  --    GFR: Estimated Creatinine Clearance: 39.1 mL/min (by C-G formula based on SCr of 1.2 mg/dL).  Liver Function Tests: Recent Labs  Lab 04/18/21 2102  AST 35  ALT 17  ALKPHOS 86  BILITOT 0.6  PROT 6.7  ALBUMIN 3.1*    Recent Labs  Lab 04/18/21 2250  AMMONIA 10    Coagulation Profile: Recent Labs  Lab 04/18/21 2102  INR 1.0    HbA1C: Hgb A1c MFr Bld  Date/Time Value Ref Range Status  01/06/2021 02:50 AM 6.5 (H) 4.8 - 5.6 % Final    Comment:    (NOTE)         Prediabetes: 5.7 - 6.4         Diabetes: >6.4         Glycemic control for adults with diabetes: <7.0   02/02/2020 10:30 AM 6.9 (H) 4.6 - 6.5 % Final    Comment:  Glycemic Control Guidelines for People with Diabetes:Non Diabetic:  <6%Goal of Therapy: <7%Additional Action Suggested:  >8%     CBG: Recent Labs  Lab 04/18/21 2103  GLUCAP 100*    Recent Results (from the past 240 hour(s))  Resp Panel by RT-PCR (Flu A&B, Covid)     Status: None   Collection Time: 04/18/21 10:50 PM   Specimen: Nasopharyngeal(NP) swabs in vial transport medium  Result Value Ref Range Status   SARS Coronavirus 2 by RT PCR NEGATIVE NEGATIVE Final    Comment: (NOTE) SARS-CoV-2 target nucleic acids are NOT DETECTED.  The SARS-CoV-2 RNA is generally detectable in upper respiratory specimens during the acute phase of infection. The lowest concentration of SARS-CoV-2 viral copies this assay can detect is 138  copies/mL. A negative result does not preclude SARS-Cov-2 infection and should not be used as the sole basis for treatment or other patient management decisions. A negative result may occur with  improper specimen collection/handling, submission of specimen other than nasopharyngeal swab, presence of viral mutation(s) within the areas targeted by this assay, and inadequate number of viral copies(<138 copies/mL). A negative result must be combined with clinical observations, patient history, and epidemiological information. The expected result is Negative.  Fact Sheet for Patients:  EntrepreneurPulse.com.au  Fact Sheet for Healthcare Providers:  IncredibleEmployment.be  This test is no t yet approved or cleared by the [place] FDA and  has been authorized for detection and/or diagnosis of SARS-CoV-2 by FDA under an Emergency Use Authorization (EUA). This EUA will remain  in effect (meaning this test can be used) for the duration of the COVID-19 declaration under Section 564(b)(1) of the Act, 21 U.S.C.section 360bbb-3(b)(1), unless the authorization is terminated  or revoked sooner.       Influenza A by PCR NEGATIVE NEGATIVE Final   Influenza B by PCR NEGATIVE NEGATIVE Final    Comment: (NOTE) The Xpert Xpress SARS-CoV-2/FLU/RSV plus assay is intended as an aid in the diagnosis of influenza from Nasopharyngeal swab specimens and should not be used as a sole basis for treatment. Nasal washings and aspirates are unacceptable for Xpert Xpress SARS-CoV-2/FLU/RSV testing.  Fact Sheet for Patients: EntrepreneurPulse.com.au  Fact Sheet for Healthcare Providers: IncredibleEmployment.be  This test is not yet approved or cleared by the [place] FDA and has been authorized for detection and/or diagnosis of SARS-CoV-2 by FDA under an Emergency Use Authorization (EUA). This EUA will remain in effect (meaning  this test can be used) for the duration of the COVID-19 declaration under Section 564(b)(1) of the Act, 21 U.S.C. section 360bbb-3(b)(1), unless the authorization is terminated or revoked.  Performed at [facility], [place] [address] [place], [place] [address]      Scheduled Meds:  enoxaparin (LOVENOX) injection  40 mg Subcutaneous QHS   Continuous Infusions:  lactated ringers 75 mL/hr at 04/19/21 0102     LOS: 0 days   Cherene Altes, MD [facility] Office  [phone] Pager - Text Page per Shea Evans  If 7PM-7AM, please contact night-coverage per Amion 04/19/2021, 12:00 PM

## 2021-04-19 NOTE — Progress Notes (Signed)
EEG attempted to complete EEG in lab however patient was uncooperative. He had a bowel movement and kept attempting to get out of bed. EEG team transported patient back to [facility]022.
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
## 2021-04-19 NOTE — ED Notes (Signed)
Attempted report X2 

## 2021-04-19 NOTE — ED Notes (Signed)
Dr. Thereasa Solo at bedside reassessing pt and updating pt and spouse on plan of care at this time.

## 2021-04-19 NOTE — Progress Notes (Signed)
PT Cancellation Note  Patient Details Name: AMIERE MOHER MRN: [id number] DOB: 25-Dec-1930   Cancelled Treatment:    Reason Eval/Treat Not Completed: Patient not medically ready. Per RN, pt agitated and uncooperative with staff. Will check back as schedule allows and pt appropriate.   Dawayne Cirri, SPT Dawayne Cirri 04/19/2021, 11:19 AM

## 2021-04-19 NOTE — ED Notes (Signed)
Pt resting quietly in bed with eyes closed. Pt laying on L side and is covered with blankets. Respirations even and unlabored. No S/S of distress noted. Letting pt rest while he is comfortable.

## 2021-04-19 NOTE — ED Notes (Signed)
Pt calling out "I gotta piss" repeatedly. Pt assisted in using urinal. Pt covered back up with blankets.

## 2021-04-19 NOTE — H&P (Addendum)
History and Physical    DEJOUR PRIMEAU [id number] DOB: 08-Apr-1931 DOA: 04/18/2021  PCP: Venia Carbon, MD  Patient coming from: Home  I have personally briefly reviewed patient's old medical records in [place]  Chief Complaint: Aphasia, L sided weakness, confusion  HPI: ACETON MACEDA is a 87 y.o. male with medical history significant of COPD, CAD, HTN, HLD.  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  Pt with h/o acute on chronic cholecystitis earlier this year, managed medically as he wasn't felt to be a surgical candidate.  Does not appear to have a cholecystostomy tube in place on todays evaluation.  Pt with h/o recurrent C.Diff diarrhea causing early toxic megacolon during admit in May, managed medically with ABx.  Suggested SNF at [place] at that time, but wife wanted to take him home.  At baseline per wife: pt has extremely poor visual acuity in both eyes, he doesn't drive, he doesn't read, doesn't do own bills, can barely see enough to get around the house.  Also hard of hearing.  Today however, he had altered mental status / confusion compared to baseline.  Couldn't recognize his daughters and son-in-law who had come to visit.  Wasn't able to cary out a conversation.  Had similar spell of confusion a few weeks ago that self resolved.  Due to these symptoms he was brought in to ED as code stroke.   ED Course: Seen by neurology initially.  Felt unlikely to have acute stroke (indeed MRI has ruled this out).  Felt less likely that the CP angle mass (likely schwanoma) was contributing (would be unusual to have seizures at this location).  CP angle mass is stable and unchanged from prior MRI.  No edema or mass effect.  WBC 10.9k  UA neg  CXR neg, COVID neg.  Ammonia nl, LFTs nl.  Remainder of work up thus far unremarkable.  Pt now sedated post ativan that was given for MRI and unable to wake up  and respond to me.   Review of Systems: Unable to perform due to mental status, sedation.  Past Medical History:  Diagnosis Date   Asthma    BPH (benign prostatic hypertrophy)    Cataract    Chronic renal disease, stage III (HCC)    Chronic sinusitis    COPD (chronic obstructive pulmonary disease) (HCC)    Coronary artery disease    2 stents   Gall stones    Hearing loss    DOES NOT WEAR HEARING AIDS   High cholesterol    Hypertension    Macular degeneration    right eye    Past Surgical History:  Procedure Laterality Date   APPENDECTOMY     CORONARY ANGIOPLASTY WITH STENT PLACEMENT  1998   2 stents   TRANSURETHRAL RESECTION OF PROSTATE       reports that he quit smoking about 15 years ago. His smoking use included cigarettes. His smokeless tobacco use includes chew. He reports that he does not drink alcohol and does not use drugs.  Allergies  Allergen Reactions   Sulfa Antibiotics Rash   Benzalkonium Chloride     "Benzalkonium chloride is a quaternary ammonium antiseptic and disinfectant with actions and uses similar to those of other cationic surfactants. It is also used as an antimicrobial preservative for pharmaceutical products."   Neosporin [Neomycin-Bacitracin Zn-Polymyx]    Augmentin [Amoxicillin-Pot Clavulanate] Rash   Terbinafine Rash  Terbinafine And Related Rash    Family History  Problem Relation Age of Onset   Diabetes Father    Heart disease Brother      Prior to Admission medications   Medication Sig Start Date End Date Taking? Authorizing Provider  aspirin EC 81 MG tablet Take 81 mg by mouth daily.    [provider]  budesonide-formoterol (SYMBICORT) 160-4.5 MCG/ACT inhaler INHALE 2 PUFFS INTO THE LUNGS TWICE DAILY Patient taking differently: Inhale 2 puffs into the lungs in the morning and at bedtime. 09/28/20   Venia Carbon, MD  gabapentin (NEURONTIN) 300 MG capsule Take 300 mg by mouth at bedtime.    [provider]   Multiple Vitamin (MULTIVITAMIN WITH MINERALS) TABS tablet Take 1 tablet by mouth daily. 01/23/21   Georgette Shell, MD  pantoprazole (PROTONIX) 40 MG tablet TAKE 1 TABLET BY MOUTH ONCE A DAY Patient taking differently: Take 40 mg by mouth daily. 08/15/20   Venia Carbon, MD  potassium chloride (KLOR-CON) 10 MEQ tablet Take 1 tablet (10 mEq total) by mouth daily. 03/15/21   Venia Carbon, MD  simvastatin (ZOCOR) 40 MG tablet Take 40 mg by mouth every evening.    [provider]  tamsulosin (FLOMAX) 0.4 MG CAPS capsule TAKE 1 CAPSULE BY MOUTH ONCE DAILY Patient taking differently: Take 0.4 mg by mouth daily. 07/14/20   Venia Carbon, MD  traMADol (ULTRAM) 50 MG tablet Take 50 mg by mouth 2 (two) times daily as needed for moderate pain. 08/15/17   [provider]  vancomycin (VANCOCIN) 125 MG capsule Take 1 capsule (125 mg total) by mouth 4 (four) times daily. For 7 days, then take 1 capsule by mouth 3 times daily for 7 days, then take 1 capsule by mouth 2 times daily for 7 days and then take 1 capsule by mouth once daily for 7 days. 03/06/21   Abner Greenspan, MD    Physical Exam: Vitals:   04/18/21 2244 04/18/21 2245 04/18/21 2300 04/18/21 2315  BP: (!) 150/68 (!) 150/68 118/69 133/76  Pulse: 95 92 86 88  Resp: '13 13 16   '$ Temp: 98.2 F (36.8 C)     TempSrc: Temporal     SpO2: 98% 97% 95% 96%    Constitutional: Sedated Eyes: PERRL, lids and conjunctivae normal ENMT: Mucous membranes are moist. Posterior pharynx clear of any exudate or lesions.Normal dentition.  Neck: normal, supple, no masses, no thyromegaly Respiratory: clear to auscultation bilaterally, no wheezing, no crackles. Normal respiratory effort. No accessory muscle use.  Cardiovascular: Regular rate and rhythm, no murmurs / rubs / gallops. No extremity edema. 2+ pedal pulses. No carotid bruits.  Abdomen: Apparently non-tender with no guarding. Musculoskeletal: no clubbing / cyanosis. No joint  deformity upper and lower extremities. Good ROM, no contractures. Normal muscle tone.  Skin: No sacral decubitus, no rashes. Neurologic: Sedated on Ativan Psychiatric: Sedated   Labs on Admission: I have personally reviewed following labs and imaging studies  CBC: Recent Labs  Lab 04/18/21 2102 04/18/21 2113  WBC 10.9*  --   NEUTROABS 5.6  --   HGB 11.0* 12.6*  HCT 36.4* 37.0*  MCV 89.4  --   PLT 290  --    Basic Metabolic Panel: Recent Labs  Lab 04/18/21 2102 04/18/21 2113  NA 137 138  K 5.0 5.0  CL 100 102  CO2 28  --   GLUCOSE 110* 105*  BUN 12 14  CREATININE 1.22 1.20  CALCIUM  9.1  --    GFR: Estimated Creatinine Clearance: 39.1 mL/min (by C-G formula based on SCr of 1.2 mg/dL). Liver Function Tests: Recent Labs  Lab 04/18/21 2102  AST 35  ALT 17  ALKPHOS 86  BILITOT 0.6  PROT 6.7  ALBUMIN 3.1*   No results for input(s): LIPASE, AMYLASE in the last 168 hours. Recent Labs  Lab 04/18/21 2250  AMMONIA 10   Coagulation Profile: Recent Labs  Lab 04/18/21 2102  INR 1.0   Cardiac Enzymes: No results for input(s): CKTOTAL, CKMB, CKMBINDEX, TROPONINI in the last 168 hours. BNP (last 3 results) No results for input(s): PROBNP in the last 8760 hours. HbA1C: No results for input(s): HGBA1C in the last 72 hours. CBG: Recent Labs  Lab 04/18/21 2103  GLUCAP 100*   Lipid Profile: No results for input(s): CHOL, HDL, LDLCALC, TRIG, CHOLHDL, LDLDIRECT in the last 72 hours. Thyroid Function Tests: Recent Labs    04/18/21 2250  TSH 1.050   Anemia Panel: Recent Labs    04/18/21 2250  VITAMINB12 692   Urine analysis:    Component Value Date/Time   COLORURINE YELLOW 04/18/2021 2250   APPEARANCEUR CLEAR 04/18/2021 2250   APPEARANCEUR Clear 04/29/2013 1543   LABSPEC 1.032 (H) 04/18/2021 2250   LABSPEC 1.020 04/29/2013 1543   PHURINE 6.0 04/18/2021 2250   GLUCOSEU NEGATIVE 04/18/2021 2250   GLUCOSEU Negative 04/29/2013 1543   HGBUR NEGATIVE  04/18/2021 2250   BILIRUBINUR NEGATIVE 04/18/2021 2250   BILIRUBINUR + 07/25/2018 1304   BILIRUBINUR Negative 04/29/2013 1543   KETONESUR NEGATIVE 04/18/2021 2250   PROTEINUR NEGATIVE 04/18/2021 2250   UROBILINOGEN 0.2 07/25/2018 1304   UROBILINOGEN 0.2 03/08/2012 1624   NITRITE NEGATIVE 04/18/2021 2250   LEUKOCYTESUR NEGATIVE 04/18/2021 2250   LEUKOCYTESUR Negative 04/29/2013 1543    Radiological Exams on Admission: DG Chest 1 View  Result Date: 04/19/2021 CLINICAL DATA:  Altered mental status. EXAM: CHEST  1 VIEW COMPARISON:  Chest x-ray 04/03/2021. FINDINGS: Examination is technically limited secondary to patient positioning. Left costophrenic angle has been excluded from the examination. There is no definite lung consolidation, pleural effusion or pneumothorax. The cardiomediastinal silhouette is grossly unchanged. Heart is mildly enlarged. No acute fractures are seen. IMPRESSION: 1. Technically limited study secondary to patient positioning. No definite acute cardiopulmonary process. Electronically Signed   By: Ronney Asters M.D.   On: 04/19/2021 00:02   MR BRAIN WO CONTRAST  Result Date: 04/18/2021 CLINICAL DATA:  Initial evaluation for neuro deficit, stroke suspected. EXAM: MRI HEAD WITHOUT CONTRAST TECHNIQUE: Multiplanar, multiecho pulse sequences of the brain and surrounding structures were obtained without intravenous contrast. COMPARISON:  Prior studies from earlier the same day as well as recent MRI from 04/03/2021. FINDINGS: Brain: Age-related cerebral atrophy with chronic microvascular ischemic disease. Remote lacunar type infarct within the posterior right corona radiata. No abnormal foci of restricted diffusion to suggest acute or subacute ischemia. Gray-white matter differentiation maintained. No encephalomalacia to suggest chronic cortical infarction. No acute intracranial hemorrhage. Multifocal areas of scattered chronic hemosiderin staining noted involving both cerebral  hemispheres, suggesting prior subarachnoid hemorrhage, stable from prior. Partially solid/partially cystic mass positioned at the left CP angle cistern again noted, stable. Mild mass effect on the adjacent left brainstem and cerebellum without significant vasogenic edema, stable. No other mass lesion or mass effect. No midline shift. Stable ventricular size without hydrocephalus. No extra-axial fluid collection. Pituitary gland and suprasellar region within normal limits. Midline structures intact. Vascular: Major intracranial vascular flow voids are maintained.  Skull and upper cervical spine: Craniocervical junction within normal limits. Bone marrow signal intensity normal. No scalp soft tissue abnormality. Sinuses/Orbits: Prior ocular lens replacement on the right. Globes and orbital soft tissues demonstrate no acute finding. Scattered mucosal thickening noted within the ethmoidal air cells. Possible sinonasal polyposis again noted. Small right mastoid effusion noted, of doubtful significance. Other: None. IMPRESSION: 1. No acute intracranial abnormality. 2. Stable partially solid/partially cystic mass at the left CP angle cistern with localized mass effect. 3. Age-related cerebral atrophy with chronic microvascular ischemic disease. 4. Scattered chronic hemosiderin staining involving both cerebral hemispheres, suggesting prior subarachnoid hemorrhage, stable. Electronically Signed   By: Jeannine Boga M.D.   On: 04/18/2021 23:08   CT HEAD CODE STROKE WO CONTRAST  Result Date: 04/18/2021 CLINICAL DATA:  Code stroke. Initial evaluation for acute left facial droop. EXAM: CT HEAD WITHOUT CONTRAST TECHNIQUE: Contiguous axial images were obtained from the base of the skull through the vertex without intravenous contrast. COMPARISON:  Prior studies from 04/03/2021. FINDINGS: Brain: Age-related cerebral atrophy with chronic microvascular ischemic disease. No acute intracranial hemorrhage. No acute large vessel  territory infarct. Mixed solid and cystic mass at the left CP angle cistern again noted, stable. No other mass effect or midline shift. No hydrocephalus or extra-axial fluid collection. Vascular: No hyperdense vessel. Calcified atherosclerosis present at skull base. Skull: Scalp soft tissues and calvarium within normal limits. Sinuses/Orbits: Globes and orbital soft tissues demonstrate no acute finding. Senescent calcifications noted. Chronic mucoperiosteal thickening with possible sinonasal polyposis noted about the ethmoidal air cells and nasal cavities. Mastoid air cells are clear. Other: None. ASPECTS [facility] Stroke Program Early CT Score) - Ganglionic level infarction (caudate, lentiform nuclei, internal capsule, insula, M1-M3 cortex): 7 - Supraganglionic infarction (M4-M6 cortex): 3 Total score (0-10 with 10 being normal): 10 IMPRESSION: 1. No acute intracranial abnormality. 2. ASPECTS is 10. 3. Stable partially solid/partially cystic left CP angle cistern mass with localized mass effect. 4. Underlying atrophy with chronic small vessel ischemic disease. 5. Question sinonasal polyposis. These results were communicated to Dr. Rory Percy at 9:20 pm on 04/18/2021 by text page via the [facility] messaging system. Electronically Signed   By: Jeannine Boga M.D.   On: 04/18/2021 21:21   CT ANGIO HEAD NECK W WO CM W PERF (CODE STROKE)  Result Date: 04/18/2021 CLINICAL DATA:  Initial evaluation for acute stroke. EXAM: CT ANGIOGRAPHY HEAD AND NECK CT PERFUSION BRAIN TECHNIQUE: Multidetector CT imaging of the head and neck was performed using the standard protocol during bolus administration of intravenous contrast. Multiplanar CT image reconstructions and MIPs were obtained to evaluate the vascular anatomy. Carotid stenosis measurements (when applicable) are obtained utilizing NASCET criteria, using the distal internal carotid diameter as the denominator. Multiphase CT imaging of the brain was performed following IV bolus  contrast injection. Subsequent parametric perfusion maps were calculated using RAPID software. CONTRAST:  162m OMNIPAQUE IOHEXOL 350 MG/ML SOLN COMPARISON:  Prior CT from earlier the same day. FINDINGS: CTA NECK FINDINGS Aortic arch: Visualized aortic arch normal in caliber with normal 3 vessel morphology. Moderate atheromatous change about the visualized aorta and origin of the great vessels without hemodynamically significant stenosis. Right carotid system: Right CCA patent from its origin to the bifurcation without stenosis. Bulky calcified plaque about the right carotid bulb/proximal right ICA with mild 25% stenosis by NASCET criteria. Right ICA patent distally without stenosis, dissection or occlusion. Left carotid system: Left CCA patent from its origin to the bifurcation without stenosis. Scattered plaque about the  left bifurcation/proximal left ICA without significant stenosis. Left ICA patent distally without stenosis, dissection or occlusion. Vertebral arteries: Both vertebral arteries arise from the subclavian arteries. Atheromatous change at the origins of both vertebral arteries without significant stenosis. Vertebral arteries patent without stenosis, dissection or occlusion. Skeleton: No visible acute osseous finding. No discrete osseous lesions. Patient is edentulous. Other neck: No other acute soft tissue abnormality within the neck. No mass or adenopathy. Upper chest: Upper lobe predominant centrilobular emphysema. Review of the MIP images confirms the above findings CTA HEAD FINDINGS Anterior circulation: Petrous segments patent bilaterally. Mild atheromatous change within the carotid siphons without significant stenosis. A1 segments patent bilaterally. Normal anterior communicating artery complex. Anterior cerebral arteries patent to their distal aspects without stenosis. No M1 high-grade stenosis or occlusion. Mild stenosis noted at the distal left M1 segment/left MCA bifurcation. Distal MCA  branches well perfused and symmetric. Posterior circulation: Both V4 segments patent to the vertebrobasilar junction without stenosis. Left vertebral artery dominant. Left PICA patent. Right PICA not seen. Basilar patent to its distal aspect without stenosis. Superior cerebral arteries patent bilaterally. Both PCAs primarily supplied via the basilar well perfused or distal aspects. Venous sinuses: Grossly patent allowing for timing the contrast bolus. Anatomic variants: None significant.  No aneurysm. Review of the MIP images confirms the above findings CT Brain Perfusion Findings: ASPECTS: 10 CBF (<30%) Volume: 87m Perfusion (Tmax>6.0s) volume: 048mMismatch Volume: 40m440mnfarction Location:Negative CT perfusion with no evidence for acute ischemia or other perfusion deficit. IMPRESSION: 1. Negative CTA for emergent large vessel occlusion. 2. Negative CT perfusion with no evidence for acute ischemia or other perfusion deficit. 3. Atheromatous change about the right carotid bulb/proximal right ICA with mild 25% stenosis by NASCET criteria. 4. Mild atheromatous change elsewhere about the major arterial vasculature of the head and neck as above. No other hemodynamically significant or correctable stenosis. 5. Emphysema (ICD10-J43.9). Results discussed by telephone at the time of interpretation on 04/18/2021 at 9:30 pm to provider [facility] verbally acknowledged these results. Electronically Signed   By: BenJeannine BogaD.   On: 04/18/2021 21:55    EKG: Independently reviewed.  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  Want to see if any evidence of flare up of cholecystitis, also take a look at colon given C.Diff history Lactic acid Procalcitonin Obs for diarrhea or fever (let RN know) Repeat CBC/CMP in AM NPO IVF: LR at 75 HTN - Holding home BP meds for the moment due to NPO status (pt not awake enough to take POs right now). Use PRN if needed  DVT prophylaxis: Lovenox Code Status: DNR - Yellow form on file from May Family Communication: No family in room Disposition Plan: TBD Consults called: Dr. AroRory Percymission status: Place in obs    Shriya Aker, JARCampbellspitalists  How to contact the [facility] or Consulting provider 7A [place] covering provider during after hours 7P -  7A, for this patient?  Check the care team in [facility] and look for a) attending/consulting [facility] provider listed and b) the [facility] team listed Log into [email or web address]  Amion Physician Scheduling and messaging for groups and whole hospitals  On call and physician scheduling software for group practices, residents, hospitalists and other medical providers for call, clinic, rotation and shift schedules. On[employer] is a hospital-wide system for scheduling doctors and paging doctors on call. EasyPlot is for scientific plotting and data analysis.  [email or web address]  and use [place]'s universal password to access. If you do not have the password, please contact the hospital operator.  Locate the [facility] provider you are looking for under [facility] and page to a number that you can be directly reached. If you still have difficulty reaching the provider, please page the [facility] (Director on Call) for the Hospitalists listed on amion for assistance.  04/19/2021, 12:30 AM
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
## 2021-04-19 NOTE — ED Notes (Signed)
Attempted report X1

## 2021-04-19 NOTE — Progress Notes (Signed)
EEG completed, results pending. 

## 2021-04-19 NOTE — ED Notes (Signed)
Attempted report X3 

## 2021-04-19 NOTE — Procedures (Signed)
Patient Name: Leon Estes  MRN: [id number]  Epilepsy Attending: Lora Havens  Referring Physician/Provider: Dr Kerney Elbe Date: 04/19/2021 Duration: 25.07 mins  Patient history: 87 year old man presenting with sudden onset of difficulty walking and ability to follow commands and also questionable left-sided facial weakness noted by EMS in the field.  EEG to evaluate for seizures  Level of alertness: Awake  AEDs during EEG study: None  Technical aspects: This EEG study was done with scalp electrodes positioned according to the 10-20 International system of electrode placement. Electrical activity was acquired at a sampling rate of '500Hz'$  and reviewed with a high frequency filter of '70Hz'$  and a low frequency filter of '1Hz'$ . EEG data were recorded continuously and digitally stored.   Description: The posterior dominant rhythm consists of  7.'5Hz'$  activity of moderate voltage (25-35 uV) seen predominantly in posterior head regions, symmetric and reactive to eye opening and eye closing. Drowsiness was characterized by attenuation of the posterior background rhythm.  EEG also showed continuous generalized 5 to 7 Hz theta slowing.  Hyperventilation and photic stimulation were not performed.     ABNORMALITY - Continuous slow, generalized  IMPRESSION: This study is suggestive of mild diffuse encephalopathy, nonspecific etiology. No seizures or epileptiform discharges were seen throughout the recording.  Sumedha Munnerlyn Barbra Sarks

## 2021-04-19 NOTE — ED Notes (Signed)
Patient to EEG.
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
## 2021-04-20 DIAGNOSIS — E860 Dehydration: Secondary | ICD-10-CM | POA: Diagnosis not present

## 2021-04-20 DIAGNOSIS — Z7982 Long term (current) use of aspirin: Secondary | ICD-10-CM | POA: Diagnosis not present

## 2021-04-20 DIAGNOSIS — G928 Other toxic encephalopathy: Secondary | ICD-10-CM | POA: Diagnosis present

## 2021-04-20 DIAGNOSIS — J449 Chronic obstructive pulmonary disease, unspecified: Secondary | ICD-10-CM | POA: Diagnosis present

## 2021-04-20 DIAGNOSIS — K819 Cholecystitis, unspecified: Secondary | ICD-10-CM | POA: Diagnosis present

## 2021-04-20 DIAGNOSIS — H5410 Blindness, one eye, low vision other eye, unspecified eyes: Secondary | ICD-10-CM | POA: Diagnosis not present

## 2021-04-20 DIAGNOSIS — G9341 Metabolic encephalopathy: Secondary | ICD-10-CM | POA: Diagnosis not present

## 2021-04-20 DIAGNOSIS — N183 Chronic kidney disease, stage 3 unspecified: Secondary | ICD-10-CM | POA: Diagnosis present

## 2021-04-20 DIAGNOSIS — Z8249 Family history of ischemic heart disease and other diseases of the circulatory system: Secondary | ICD-10-CM | POA: Diagnosis not present

## 2021-04-20 DIAGNOSIS — H9190 Unspecified hearing loss, unspecified ear: Secondary | ICD-10-CM | POA: Diagnosis present

## 2021-04-20 DIAGNOSIS — I129 Hypertensive chronic kidney disease with stage 1 through stage 4 chronic kidney disease, or unspecified chronic kidney disease: Secondary | ICD-10-CM | POA: Diagnosis present

## 2021-04-20 DIAGNOSIS — I10 Essential (primary) hypertension: Secondary | ICD-10-CM | POA: Diagnosis not present

## 2021-04-20 DIAGNOSIS — N179 Acute kidney failure, unspecified: Secondary | ICD-10-CM | POA: Diagnosis not present

## 2021-04-20 DIAGNOSIS — Z20822 Contact with and (suspected) exposure to covid-19: Secondary | ICD-10-CM | POA: Diagnosis present

## 2021-04-20 DIAGNOSIS — Z880 Allergy status to penicillin: Secondary | ICD-10-CM | POA: Diagnosis not present

## 2021-04-20 DIAGNOSIS — D3610 Benign neoplasm of peripheral nerves and autonomic nervous system, unspecified: Secondary | ICD-10-CM | POA: Diagnosis present

## 2021-04-20 DIAGNOSIS — Z79899 Other long term (current) drug therapy: Secondary | ICD-10-CM | POA: Diagnosis not present

## 2021-04-20 DIAGNOSIS — I2510 Atherosclerotic heart disease of native coronary artery without angina pectoris: Secondary | ICD-10-CM | POA: Diagnosis present

## 2021-04-20 DIAGNOSIS — R4701 Aphasia: Secondary | ICD-10-CM | POA: Diagnosis present

## 2021-04-20 DIAGNOSIS — N400 Enlarged prostate without lower urinary tract symptoms: Secondary | ICD-10-CM | POA: Diagnosis present

## 2021-04-20 DIAGNOSIS — A0471 Enterocolitis due to Clostridium difficile, recurrent: Secondary | ICD-10-CM | POA: Diagnosis present

## 2021-04-20 DIAGNOSIS — K8010 Calculus of gallbladder with chronic cholecystitis without obstruction: Secondary | ICD-10-CM | POA: Diagnosis present

## 2021-04-20 DIAGNOSIS — Z7951 Long term (current) use of inhaled steroids: Secondary | ICD-10-CM | POA: Diagnosis not present

## 2021-04-20 DIAGNOSIS — Z66 Do not resuscitate: Secondary | ICD-10-CM | POA: Diagnosis present

## 2021-04-20 DIAGNOSIS — Z955 Presence of coronary angioplasty implant and graft: Secondary | ICD-10-CM | POA: Diagnosis not present

## 2021-04-20 DIAGNOSIS — Z833 Family history of diabetes mellitus: Secondary | ICD-10-CM | POA: Diagnosis not present

## 2021-04-20 DIAGNOSIS — E876 Hypokalemia: Secondary | ICD-10-CM | POA: Diagnosis present

## 2021-04-20 NOTE — Patient Care Conference (Signed)
Called and updated patient's spouse over phone. All questions answered 

## 2021-04-20 NOTE — Progress Notes (Signed)
Neurology Progress Note  S: Patient is confused and not answering questions so can not participate in ROS. Sitter at bedside.   Patient was a stroke code on 04/19/21 for aphasia and left sided weakness. MRI brain negative for stroke, but positive for stable left CP angle cistern mass, most likely a Schwannoma, with localized mass effect. This is stable from 04/03/21 MRI.   O: Current vital signs: BP (!) 157/76 (BP Location: Left Arm)   Pulse 93   Temp 98.2 F (36.8 C) (Oral)   Resp 19   SpO2 96%  Vital signs in last 24 hours: Temp:  [98 F (36.7 C)-98.5 F (36.9 C)] 98.2 F (36.8 C) (09/08 0439) Pulse Rate:  [90-102] 93 (09/08 0439) Resp:  [16-24] 19 (09/08 0439) BP: (143-182)/(76-98) 157/76 (09/08 0439) SpO2:  [96 %-100 %] 96 % (09/08 0439)  GENERAL: Chronically ill appearing male. Awake, alert in NAD. HEENT: Normocephalic and atraumatic. LUNGS: Normal respiratory effort.  Ext: warm. Psych: Agitated.   NEURO:  Mental Status: Awake and Alert . Will not answer orientation or other questions.  Speech/Language: speech is without aphasia or dysarthria with the few words that he speaks. Curses at NP, kicks NP, and attempts to hit NP on exam.  Cranial Nerves:  He is not cooperative for exam. Upon being touched, he kicks, hits and cusses.  Motor:  Grossly strong when he kicks and hits. Moves extremities spontaneously and purposefully.  Tone: is normal and bulk is normal. Sensation- Intact to light touch bilaterally.  Gait- deferred.  Medications  Current Facility-Administered Medications:    acetaminophen (TYLENOL) tablet 650 mg, 650 mg, Oral, Q6H PRN **OR** [DISCONTINUED] acetaminophen (TYLENOL) suppository 650 mg, 650 mg, Rectal, Q6H PRN, Alcario Drought, Jared M, DO   enoxaparin (LOVENOX) injection 40 mg, 40 mg, Subcutaneous, Q24H, McClung, Kimberlee Nearing, MD   haloperidol lactate (HALDOL) injection 1-2 mg, 1-2 mg, Intravenous, Q6H PRN, Cherene Altes, MD   lactated ringers infusion, ,  Intravenous, Continuous, Alcario Drought, Jared M, DO, Last Rate: 75 mL/hr at 04/20/21 0032, New Bag at 04/20/21 0032   mometasone-formoterol (DULERA) 200-5 MCG/ACT inhaler 2 puff, 2 puff, Inhalation, BID, Thereasa Solo, Kimberlee Nearing, MD   ondansetron (ZOFRAN) tablet 4 mg, 4 mg, Oral, Q6H PRN **OR** ondansetron (ZOFRAN) injection 4 mg, 4 mg, Intravenous, Q6H PRN, Alcario Drought, Jared M, DO   pantoprazole (PROTONIX) EC tablet 40 mg, 40 mg, Oral, Daily, McClung, Kimberlee Nearing, MD   QUEtiapine (SEROQUEL) tablet 25 mg, 25 mg, Oral, QHS, McClung, Kimberlee Nearing, MD   tamsulosin (FLOMAX) capsule 0.4 mg, 0.4 mg, Oral, QHS, Cherene Altes, MD  Pertinent Labs Vitamin B12 level 692      TSH 1.050       Ammonia 10   No new Imaging  EEG 04/19/21 This study is suggestive of mild diffuse encephalopathy, nonspecific etiology. No seizures or epileptiform discharges were seen throughout the recording.  Assessment: 87 yo male who came in as a code stroke, but negative for stroke. He has a known schwannoma which is stable on our r/p imaging. EEG was negative for seizure activity but positive for encephalopathy. So far, his workup for causes of encephalopathy has not revealed a cause. He is certainly prone to encephalopathy/delirium from his chronic infections, and hearing and sight disabilities. He was to f/up with [facility] out patient for this known schwannoma, but it is unclear if he has thus far. We have ruled out stroke and seizures as the cause of his encephalopathy. This is likely a situation  where delirium is playing a role, and precautions should be put in place and care should be taken to avoid medications on the [facility] list for the elderly. Also, suspicion for underlying dementia.   Impression: -Multifactorial encephalopathy and delirium.  -Known Schwannoma to be followed by [facility] outpatient.   Recommendations/Plan:  -Delirium precautions.  -Avoid medications on the [facility] list for the elderly.  -? Query underlying dementia. Out patient  neuropsych testing.  -Continue to hold Gabapentin.  -Try to use Seroquel and titrate dose and avoid use of Haldol.  -Neurology will be available prn for questions or concerns.   Pt seen by Clance Boll, MSN, APN-BC/Nurse Practitioner/Neuro   Pager: [phone]  Electronically signed: Dr. Kerney Elbe

## 2021-04-20 NOTE — TOC Initial Note (Signed)
Transition of Care [facility]) - Initial/Assessment Note    Patient Details  Name: Leon Estes MRN: [id number] Date of Birth: 07-20-1931  [facility] CM/SW Contact:    Pollie Friar, RN Phone Number: 04/20/2021, 1:38 PM  Clinical Narrative:                 CM met with the patient, daughter, wife at the bedside. Pt slept through meeting. Wife states she is with the patient almost 24/7. She says they have all needed DME at home. She does his medications at home. Daughter provides needed transportation. Awaiting PT/OT evals.  TOC following.   Expected Discharge Plan: Home/Self Care Barriers to Discharge: Continued Medical Work up   Patient Goals and CMS Choice        Expected Discharge Plan and Services Expected Discharge Plan: Home/Self Care   Discharge Planning Services: CM Consult   Living arrangements for the past 2 months: Single Family Home                                      Prior Living Arrangements/Services Living arrangements for the past 2 months: Single Family Home Lives with:: Spouse Patient language and need for interpreter reviewed:: Yes Do you feel safe going back to the place where you live?: Yes        Care giver support system in place?: Yes (comment) Current home services: DME (cane/ walker/ shower seat/ 3 in 1/ wheelchair) Criminal Activity/Legal Involvement Pertinent to Current Situation/Hospitalization: No - Comment as needed  Activities of Daily Living      Permission Sought/Granted                  Emotional Assessment Appearance:: Appears stated age         Psych Involvement: No (comment)  Admission diagnosis:  Cholecystitis [I14.4] Toxic metabolic encephalopathy [R15.4] Blindness and low vision [H54.10] Weakness of right lower extremity [M08.676] Acute metabolic encephalopathy [P95.09] AMS (altered mental status) [R41.82] Patient Active Problem List   Diagnosis Date Noted   Acute metabolic  encephalopathy 32/67/1245   Cholecystitis 80/99/8338   Toxic metabolic encephalopathy 25/12/3974   Skin tear of right upper arm without complication 73/41/9379   Neoplasm of brain causing mass effect on adjacent structures ([place]) 04/07/2021   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   Delirium 02/08/2021   Goals of care, counseling/discussion    Colitis due to Clostridioides difficile 12/31/2020   HOH (hard of hearing) 12/28/2020   Acute on chronic cholecystitis 12/28/2020   Arrhythmia 12/06/2020   Pulmonary nodule 12/06/2020   Acute cholecystitis 11/26/2020   Preventative health care 10/12/2020   Macular degeneration, wet ([place]) 10/12/2020   Acquired claw toe, left 02/02/2020   BPH (benign prostatic hyperplasia) 01/13/2020   Neuropathy 02/18/2018   COPD (chronic obstructive pulmonary disease) ([place])    Chronic renal disease, stage III ([place])    Benign essential HTN 04/09/2017   CAD (coronary artery disease) 03/03/2015   Hyperlipidemia 03/03/2015   PCP:  Venia Carbon, MD Pharmacy:   [facility], [place] - [address], [address], [place] [address] Phone: [phone] Fax: [place], [place] [address] [place] [address] Phone: [phone] Fax: [phone]     Social Determinants of Health (SDOH) Interventions    Readmission Risk Interventions No flowsheet  data found.

## 2021-04-20 NOTE — Progress Notes (Signed)
OT Cancellation Note  Patient Details Name: Leon Estes MRN: [id number] DOB: 05/31/1931   Cancelled Treatment:    Reason Eval/Treat Not Completed: Patient declined, no reason specified order received, chart reviewed. Nursing cleared for session but upon arrival to room pt sleeping soundly, and not waking to therapist attempt. OT will continue efforts schedule permitting  Nasif Bos OTR/L acute rehab services Office: [phone]  04/20/2021, 11:34 AM

## 2021-04-20 NOTE — Plan of Care (Signed)
  Problem: Education: Goal: Knowledge of General Education information will improve Description: Including pain rating scale, medication(s)/side effects and non-pharmacologic comfort measures Outcome: Not Progressing   

## 2021-04-20 NOTE — Progress Notes (Signed)
PROGRESS NOTE    Leon Estes  [id number] DOB: [birth year] DOA: 04/18/2021 PCP: Venia Carbon, MD    Brief Narrative:  87 year old with a history of COPD, CAD, Schwanoma, HTN, HLD, severe visual loss, HOH, recent acute on chronic cholecystitis, and recurrent [name] episodes who was brought to the ER after he developed altered mental status/confusion.  In the ED MRI ruled out the possibility of an acute CVA.  Assessment & Plan:   Principal Problem:   Acute metabolic encephalopathy Active Problems:   Benign essential HTN   Macular degeneration, wet (HCC)   HOH (hard of hearing)   Recurrent Clostridioides difficile diarrhea   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)   Cholecystitis   Toxic metabolic encephalopathy    Acute toxic metabolic encephalopathy MR brain without acute findings -TSH normal -UA unrevealing -ammonia and B12 normal -UA unremarkable -no ultrasound findings to suggest acute cholecystitis -Appreciate input by Neurology . Concerns for possible underlying dementia, recommendation for outpatient neuropsych testing -Cont trial of seroquel, cont to hold gabapentin -Avoid meds per Beer's list in the elderly   HTN -Suboptimally controlled at this time -no bp meds listed on home med list   COPD Seems to be compensated at this time   Schwannoma No evidence of an acute complicating issue   Severe visual loss and hearing loss   Chronic cholecystitis   History of recurrent [name]    DVT prophylaxis: Lovenox subq Code Status: DNR Family Communication: Pt in room, family not at bedside  Status is: Inpatient  Remains inpatient appropriate because:Altered mental status and Inpatient level of care appropriate due to severity of illness  Dispo: The patient is from: Home              Anticipated d/c is to: SNF              Patient currently is not medically stable to d/c.   Difficult to place patient  No       Consultants:  Neurology  Procedures:    Antimicrobials: Anti-infectives (From admission, onward)    None       Subjective: Remains confused this AM  Objective: Vitals:   04/19/21 1656 04/19/21 1953 04/20/21 0025 04/20/21 0439  BP:  (!) 155/77 (!) 182/98 (!) 157/76  Pulse: 91 98 (!) 102 93  Resp:  '17 16 19  '$ Temp:  98.3 F (36.8 C) 98.2 F (36.8 C) 98.2 F (36.8 C)  TempSrc:  Oral Oral Oral  SpO2: 100% 98% 98% 96%   No intake or output data in the 24 hours ending 04/20/21 1537 There were no vitals filed for this visit.  Examination: General exam: Awake, laying in bed, in nad Respiratory system: Normal respiratory effort, no wheezing Cardiovascular system: regular rate, s1, s2 Gastrointestinal system: Soft, nondistended, positive BS Central nervous system: CN2-12 grossly intact, strength intact Extremities: Perfused, no clubbing Skin: Normal skin turgor, no notable skin lesions seen Psychiatry: Difficult to assess given mentation  Data Reviewed: I have personally reviewed following labs and imaging studies  CBC: Recent Labs  Lab 04/18/21 2102 04/18/21 2113 04/19/21 1930  WBC 10.9*  --  4.4  NEUTROABS 5.6  --   --   HGB 11.0* 12.6* 10.1*  HCT 36.4* 37.0* 28.6*  MCV 89.4  --  96.6  PLT 290  --  [id number]   Basic Metabolic Panel: Recent Labs  Lab 04/18/21 2102 04/18/21 2113  NA 137 138  K 5.0 5.0  CL 100 102  CO2 28  --   GLUCOSE 110* 105*  BUN 12 14  CREATININE 1.22 1.20  CALCIUM 9.1  --    GFR: Estimated Creatinine Clearance: 39.1 mL/min (by C-G formula based on SCr of 1.2 mg/dL). Liver Function Tests: Recent Labs  Lab 04/18/21 2102  AST 35  ALT 17  ALKPHOS 86  BILITOT 0.6  PROT 6.7  ALBUMIN 3.1*   No results for input(s): LIPASE, AMYLASE in the last 168 hours. Recent Labs  Lab 04/18/21 2250  AMMONIA 10   Coagulation Profile: Recent Labs  Lab 04/18/21 2102  INR 1.0   Cardiac Enzymes: No results for input(s):  CKTOTAL, CKMB, CKMBINDEX, TROPONINI in the last 168 hours. BNP (last 3 results) No results for input(s): PROBNP in the last 8760 hours. HbA1C: No results for input(s): HGBA1C in the last 72 hours. CBG: Recent Labs  Lab 04/18/21 2103  GLUCAP 100*   Lipid Profile: No results for input(s): CHOL, HDL, LDLCALC, TRIG, CHOLHDL, LDLDIRECT in the last 72 hours. Thyroid Function Tests: Recent Labs    04/18/21 2250  TSH 1.050   Anemia Panel: Recent Labs    04/18/21 2250  VITAMINB12 692   Sepsis Labs: Recent Labs  Lab 04/19/21 0043 04/19/21 0101  PROCALCITON  --  <0.10  LATICACIDVEN 1.6  --     Recent Results (from the past 240 hour(s))  Resp Panel by RT-PCR (Flu A&B, Covid)     Status: None   Collection Time: 04/18/21 10:50 PM   Specimen: Nasopharyngeal(NP) swabs in vial transport medium  Result Value Ref Range Status   SARS Coronavirus 2 by RT PCR NEGATIVE NEGATIVE Final    Comment: (NOTE) SARS-CoV-2 target nucleic acids are NOT DETECTED.  The SARS-CoV-2 RNA is generally detectable in upper respiratory specimens during the acute phase of infection. The lowest concentration of SARS-CoV-2 viral copies this assay can detect is 138 copies/mL. A negative result does not preclude SARS-Cov-2 infection and should not be used as the sole basis for treatment or other patient management decisions. A negative result may occur with  improper specimen collection/handling, submission of specimen other than nasopharyngeal swab, presence of viral mutation(s) within the areas targeted by this assay, and inadequate number of viral copies(<138 copies/mL). A negative result must be combined with clinical observations, patient history, and epidemiological information. The expected result is Negative.  Fact Sheet for Patients:  EntrepreneurPulse.com.au  Fact Sheet for Healthcare Providers:  IncredibleEmployment.be  This test is no t yet approved or  cleared by the [place] FDA and  has been authorized for detection and/or diagnosis of SARS-CoV-2 by FDA under an Emergency Use Authorization (EUA). This EUA will remain  in effect (meaning this test can be used) for the duration of the COVID-19 declaration under Section 564(b)(1) of the Act, 21 U.S.C.section 360bbb-3(b)(1), unless the authorization is terminated  or revoked sooner.       Influenza A by PCR NEGATIVE NEGATIVE Final   Influenza B by PCR NEGATIVE NEGATIVE Final    Comment: (NOTE) The Xpert Xpress SARS-CoV-2/FLU/RSV plus assay is intended as an aid in the diagnosis of influenza from Nasopharyngeal swab specimens and should not be used as a sole basis for treatment. Nasal washings and aspirates are unacceptable for Xpert Xpress SARS-CoV-2/FLU/RSV testing.  Fact Sheet for Patients: EntrepreneurPulse.com.au  Fact Sheet for Healthcare Providers: IncredibleEmployment.be  This test is not yet approved or cleared by the [place] and has been authorized for  detection and/or diagnosis of SARS-CoV-2 by FDA under an Emergency Use Authorization (EUA). This EUA will remain in effect (meaning this test can be used) for the duration of the COVID-19 declaration under Section 564(b)(1) of the Act, 21 U.S.C. section 360bbb-3(b)(1), unless the authorization is terminated or revoked.  Performed at [facility], [place] [address] [place], [place] [address]      Radiology Studies: DG Chest 1 View  Result Date: 04/19/2021 CLINICAL DATA:  Altered mental status. EXAM: CHEST  1 VIEW COMPARISON:  Chest x-ray 04/03/2021. FINDINGS: Examination is technically limited secondary to patient positioning. Left costophrenic angle has been excluded from the examination. There is no definite lung consolidation, pleural effusion or pneumothorax. The cardiomediastinal silhouette is grossly unchanged. Heart is mildly enlarged. No acute fractures are  seen. IMPRESSION: 1. Technically limited study secondary to patient positioning. No definite acute cardiopulmonary process. Electronically Signed   By: Ronney Asters M.D.   On: 04/19/2021 00:02   MR BRAIN WO CONTRAST  Result Date: 04/18/2021 CLINICAL DATA:  Initial evaluation for neuro deficit, stroke suspected. EXAM: MRI HEAD WITHOUT CONTRAST TECHNIQUE: Multiplanar, multiecho pulse sequences of the brain and surrounding structures were obtained without intravenous contrast. COMPARISON:  Prior studies from earlier the same day as well as recent MRI from 04/03/2021. FINDINGS: Brain: Age-related cerebral atrophy with chronic microvascular ischemic disease. Remote lacunar type infarct within the posterior right corona radiata. No abnormal foci of restricted diffusion to suggest acute or subacute ischemia. Gray-white matter differentiation maintained. No encephalomalacia to suggest chronic cortical infarction. No acute intracranial hemorrhage. Multifocal areas of scattered chronic hemosiderin staining noted involving both cerebral hemispheres, suggesting prior subarachnoid hemorrhage, stable from prior. Partially solid/partially cystic mass positioned at the left CP angle cistern again noted, stable. Mild mass effect on the adjacent left brainstem and cerebellum without significant vasogenic edema, stable. No other mass lesion or mass effect. No midline shift. Stable ventricular size without hydrocephalus. No extra-axial fluid collection. Pituitary gland and suprasellar region within normal limits. Midline structures intact. Vascular: Major intracranial vascular flow voids are maintained. Skull and upper cervical spine: Craniocervical junction within normal limits. Bone marrow signal intensity normal. No scalp soft tissue abnormality. Sinuses/Orbits: Prior ocular lens replacement on the right. Globes and orbital soft tissues demonstrate no acute finding. Scattered mucosal thickening noted within the ethmoidal air  cells. Possible sinonasal polyposis again noted. Small right mastoid effusion noted, of doubtful significance. Other: None. IMPRESSION: 1. No acute intracranial abnormality. 2. Stable partially solid/partially cystic mass at the left CP angle cistern with localized mass effect. 3. Age-related cerebral atrophy with chronic microvascular ischemic disease. 4. Scattered chronic hemosiderin staining involving both cerebral hemispheres, suggesting prior subarachnoid hemorrhage, stable. Electronically Signed   By: Jeannine Boga M.D.   On: 04/18/2021 23:08   EEG adult  Result Date: 04/19/2021 Lora Havens, MD     04/19/2021  3:51 PM Patient Name: PIERE SUMSION MRN: [id number] Epilepsy Attending: Lora Havens Referring Physician/Provider: Dr Kerney Elbe Date: 04/19/2021 Duration: 25.07 mins Patient history: 87 year old man presenting with sudden onset of difficulty walking and ability to follow commands and also questionable left-sided facial weakness noted by EMS in the field.  EEG to evaluate for seizures Level of alertness: Awake AEDs during EEG study: None Technical aspects: This EEG study was done with scalp electrodes positioned according to the 10-20 International system of electrode placement. Electrical activity was acquired at a sampling rate of '500Hz'$  and reviewed with a high frequency filter of  $'70Hz'x$  and a low frequency filter of '1Hz'$ . EEG data were recorded continuously and digitally stored. Description: The posterior dominant rhythm consists of  7.'5Hz'$  activity of moderate voltage (25-35 uV) seen predominantly in posterior head regions, symmetric and reactive to eye opening and eye closing. Drowsiness was characterized by attenuation of the posterior background rhythm.  EEG also showed continuous generalized 5 to 7 Hz theta slowing.  Hyperventilation and photic stimulation were not performed.   ABNORMALITY - Continuous slow, generalized IMPRESSION: This study is suggestive of mild diffuse  encephalopathy, nonspecific etiology. No seizures or epileptiform discharges were seen throughout the recording. Lora Havens   CT HEAD CODE STROKE WO CONTRAST  Result Date: 04/18/2021 CLINICAL DATA:  Code stroke. Initial evaluation for acute left facial droop. EXAM: CT HEAD WITHOUT CONTRAST TECHNIQUE: Contiguous axial images were obtained from the base of the skull through the vertex without intravenous contrast. COMPARISON:  Prior studies from 04/03/2021. FINDINGS: Brain: Age-related cerebral atrophy with chronic microvascular ischemic disease. No acute intracranial hemorrhage. No acute large vessel territory infarct. Mixed solid and cystic mass at the left CP angle cistern again noted, stable. No other mass effect or midline shift. No hydrocephalus or extra-axial fluid collection. Vascular: No hyperdense vessel. Calcified atherosclerosis present at skull base. Skull: Scalp soft tissues and calvarium within normal limits. Sinuses/Orbits: Globes and orbital soft tissues demonstrate no acute finding. Senescent calcifications noted. Chronic mucoperiosteal thickening with possible sinonasal polyposis noted about the ethmoidal air cells and nasal cavities. Mastoid air cells are clear. Other: None. ASPECTS [facility] Stroke Program Early CT Score) - Ganglionic level infarction (caudate, lentiform nuclei, internal capsule, insula, M1-M3 cortex): 7 - Supraganglionic infarction (M4-M6 cortex): 3 Total score (0-10 with 10 being normal): 10 IMPRESSION: 1. No acute intracranial abnormality. 2. ASPECTS is 10. 3. Stable partially solid/partially cystic left CP angle cistern mass with localized mass effect. 4. Underlying atrophy with chronic small vessel ischemic disease. 5. Question sinonasal polyposis. These results were communicated to Dr. Rory Percy at 9:20 pm on 04/18/2021 by text page via the [facility] messaging system. Electronically Signed   By: Jeannine Boga M.D.   On: 04/18/2021 21:21   CT ANGIO HEAD NECK W WO CM W  PERF (CODE STROKE)  Result Date: 04/18/2021 CLINICAL DATA:  Initial evaluation for acute stroke. EXAM: CT ANGIOGRAPHY HEAD AND NECK CT PERFUSION BRAIN TECHNIQUE: Multidetector CT imaging of the head and neck was performed using the standard protocol during bolus administration of intravenous contrast. Multiplanar CT image reconstructions and MIPs were obtained to evaluate the vascular anatomy. Carotid stenosis measurements (when applicable) are obtained utilizing NASCET criteria, using the distal internal carotid diameter as the denominator. Multiphase CT imaging of the brain was performed following IV bolus contrast injection. Subsequent parametric perfusion maps were calculated using RAPID software. CONTRAST:  138m OMNIPAQUE IOHEXOL 350 MG/ML SOLN COMPARISON:  Prior CT from earlier the same day. FINDINGS: CTA NECK FINDINGS Aortic arch: Visualized aortic arch normal in caliber with normal 3 vessel morphology. Moderate atheromatous change about the visualized aorta and origin of the great vessels without hemodynamically significant stenosis. Right carotid system: Right CCA patent from its origin to the bifurcation without stenosis. Bulky calcified plaque about the right carotid bulb/proximal right ICA with mild 25% stenosis by NASCET criteria. Right ICA patent distally without stenosis, dissection or occlusion. Left carotid system: Left CCA patent from its origin to the bifurcation without stenosis. Scattered plaque about the left bifurcation/proximal left ICA without significant stenosis. Left ICA patent distally without stenosis,  dissection or occlusion. Vertebral arteries: Both vertebral arteries arise from the subclavian arteries. Atheromatous change at the origins of both vertebral arteries without significant stenosis. Vertebral arteries patent without stenosis, dissection or occlusion. Skeleton: No visible acute osseous finding. No discrete osseous lesions. Patient is edentulous. Other neck: No other acute  soft tissue abnormality within the neck. No mass or adenopathy. Upper chest: Upper lobe predominant centrilobular emphysema. Review of the MIP images confirms the above findings CTA HEAD FINDINGS Anterior circulation: Petrous segments patent bilaterally. Mild atheromatous change within the carotid siphons without significant stenosis. A1 segments patent bilaterally. Normal anterior communicating artery complex. Anterior cerebral arteries patent to their distal aspects without stenosis. No M1 high-grade stenosis or occlusion. Mild stenosis noted at the distal left M1 segment/left MCA bifurcation. Distal MCA branches well perfused and symmetric. Posterior circulation: Both V4 segments patent to the vertebrobasilar junction without stenosis. Left vertebral artery dominant. Left PICA patent. Right PICA not seen. Basilar patent to its distal aspect without stenosis. Superior cerebral arteries patent bilaterally. Both PCAs primarily supplied via the basilar well perfused or distal aspects. Venous sinuses: Grossly patent allowing for timing the contrast bolus. Anatomic variants: None significant.  No aneurysm. Review of the MIP images confirms the above findings CT Brain Perfusion Findings: ASPECTS: 10 CBF (<30%) Volume: 59m Perfusion (Tmax>6.0s) volume: 012mMismatch Volume: 23m44mnfarction Location:Negative CT perfusion with no evidence for acute ischemia or other perfusion deficit. IMPRESSION: 1. Negative CTA for emergent large vessel occlusion. 2. Negative CT perfusion with no evidence for acute ischemia or other perfusion deficit. 3. Atheromatous change about the right carotid bulb/proximal right ICA with mild 25% stenosis by NASCET criteria. 4. Mild atheromatous change elsewhere about the major arterial vasculature of the head and neck as above. No other hemodynamically significant or correctable stenosis. 5. Emphysema (ICD10-J43.9). Results discussed by telephone at the time of interpretation on 04/18/2021 at 9:30 pm to  provider [facility]enterwho verbally acknowledged these results. Electronically Signed   By: BenJeannine BogaD.   On: 04/18/2021 21:55   US [place] Limited RUQ (LIVER/GB)  Result Date: 04/19/2021 CLINICAL DATA:  Cholecystitis. EXAM: ULTRASOUND ABDOMEN LIMITED RIGHT UPPER QUADRANT COMPARISON:  Abdominopelvic CT 01/02/2021, abdominal ultrasound 12/28/2018 FINDINGS: Gallbladder: Gallbladder is difficult to delineate, however appears contracted. Wall thickening of 4 mm is not unexpected for nondistention. There is a shadowing stone in the gallbladder. No sonographic Murphy sign noted by sonographer. Common bile duct: Diameter: 7-8 mm. Liver: No focal lesion identified. Within normal limits in parenchymal echogenicity. Portal vein is patent on color Doppler imaging with normal direction of blood flow towards the liver. Other: Technically limited exam, performed in left lateral decubitus positioning. Patient was asleep through the exam. IMPRESSION: 1. Gallstone within contracted gallbladder. Wall thickness of 4 mm is not unexpected for nondistention. No sonographic findings to suggest acute cholecystitis. 2. Common bile duct is upper normal, likely normal for age. 3. Limited but grossly normal assessment of the liver. Electronically Signed   By: MelKeith RakeD.   On: 04/19/2021 01:41    Scheduled Meds:  enoxaparin (LOVENOX) injection  40 mg Subcutaneous Q24H   mometasone-formoterol  2 puff Inhalation BID   pantoprazole  40 mg Oral Daily   QUEtiapine  25 mg Oral QHS   tamsulosin  0.4 mg Oral QHS   Continuous Infusions:  lactated ringers 75 mL/hr at 04/20/21 0032     LOS: 0 days   SteMarylu LundD [facility] Pager On Amion  If  7PM-7AM, please contact night-coverage 04/20/2021, 3:37 PM

## 2021-04-20 NOTE — Evaluation (Signed)
Physical Therapy Evaluation Patient Details Name: Leon Estes MRN: [id number] DOB: 1931/06/01 Today's Date: 04/20/2021   History of Present Illness  87 y.o. male admitted on 04/18/21 for L sided weakness, confusion, aphasia.  Pt dx with encepholopathy of unknown origin, medical workup continues.  MRI stable (shows Schwannoma, but stable compared to last scan).  Pt with signiciant PMH of cataract, COPD, CAD, haring loss, HTN, macular degeneration.  Clinical Impression  Pt is very confused, not really recognizing his wife, Leon Estes of 68 years.  Not assisting much in mobilizing to EOB or for rolling for pericare (incontinent in bed).  Wife and daughter report this is very different than his baseline functioning.  He may need SNF placement at this level of assistance and care.   PT to follow acutely for deficits listed below.       Follow Up Recommendations SNF    Equipment Recommendations  Hospital bed;Wheelchair cushion (measurements PT);Wheelchair (measurements PT)    Recommendations for Other Services       Precautions / Restrictions Precautions Precautions: Fall      Mobility  Bed Mobility Overal bed mobility: Needs Assistance Bed Mobility: Rolling;Supine to Sit;Sit to Supine Rolling: Max assist;+2 for physical assistance   Supine to sit: Max assist;HOB elevated Sit to supine: Max assist;HOB elevated   General bed mobility comments: Two person max assist to roll bil for pericare, BM in bed.  HOB maximally elevated, pt did not resist coming to sit EOB but he also did not help much. Similar going back into bed.    Transfers                 General transfer comment: NT  Ambulation/Gait                Stairs            Wheelchair Mobility    Modified Rankin (Stroke Patients Only)       Balance Overall balance assessment: Needs assistance Sitting-balance support: Feet supported;No upper extremity supported Sitting balance-Leahy Scale: Zero Sitting  balance - Comments: max assist EOB , left lateral lean Postural control: Left lateral lean                                   Pertinent Vitals/Pain Pain Assessment: Faces Faces Pain Scale: No hurt    Home Living Family/patient expects to be discharged to:: Private residence Living Arrangements: Spouse/significant other Available Help at Discharge: Family;Available 24 hours/day Type of Home: House Home Access: Stairs to enter     Home Layout: Two level;Able to live on main level with bedroom/bathroom Home Equipment: [place] - 2 wheels;Cane - single point;Walker - 4 wheels;Shower seat      Prior Function Level of Independence: Needs assistance   Gait / Transfers Assistance Needed: pt ambulates with RW, follows wife around the house, but does not need assist, mostly supervision.  ADL's / Homemaking Assistance Needed: wife reports he does his own bathing and dressing, and helps her with light chores.        Hand Dominance   Dominant Hand: Right    Extremity/Trunk Assessment   Upper Extremity Assessment Upper Extremity Assessment: Generalized weakness    Lower Extremity Assessment Lower Extremity Assessment: Generalized weakness    Cervical / Trunk Assessment Cervical / Trunk Assessment: Kyphotic  Communication   Communication: HOH  Cognition Arousal/Alertness: Lethargic (until EOB, then awake, at least eyes open) Behavior  During Therapy: Flat affect Overall Cognitive Status: Impaired/Different from baseline Area of Impairment: Orientation;Attention;Memory;Safety/judgement;Following commands;Awareness;Problem solving                 Orientation Level: Disoriented to;Person;Time;Place;Situation (seems to respond more) Current Attention Level: Focused Memory: Decreased short-term memory Following Commands: Follows one step commands inconsistently;Follows one step commands with increased time (I did not get him to follow any  commands) Safety/Judgement: Decreased awareness of safety;Decreased awareness of deficits Awareness: Intellectual Problem Solving: Slow processing;Decreased initiation;Difficulty sequencing;Requires verbal cues;Requires tactile cues General Comments: Pt lethargic until sitting up EOB and then he was alert, but not very responsive.  Said two words the entire session.  Responds turning his head to his name, but did not answer questions or follow commands.      General Comments General comments (skin integrity, edema, etc.): wife in room attempting to get pt to respond and giving details of PLOF. She is tearful as husband is very different than baseline.    Exercises     Assessment/Plan    PT Assessment Patient needs continued PT services  PT Problem List Decreased strength;Decreased activity tolerance;Decreased mobility;Decreased balance;Decreased cognition;Decreased knowledge of use of DME;Decreased safety awareness;Decreased knowledge of precautions       PT Treatment Interventions DME instruction;Gait training;Stair training;Functional mobility training;Therapeutic exercise;Therapeutic activities;Balance training;Neuromuscular re-education;Cognitive remediation;Patient/family education;Wheelchair mobility training    PT Goals (Current goals can be found in the Care Plan section)  Acute Rehab PT Goals Patient Stated Goal: family wants him back to his normal PT Goal Formulation: With family Time For Goal Achievement: 05/04/21 Potential to Achieve Goals: Fair    Frequency Min 2X/week   Barriers to discharge        Co-evaluation               AM-PAC PT "6 Clicks" Mobility  Outcome Measure Help needed turning from your back to your side while in a flat bed without using bedrails?: Total Help needed moving from lying on your back to sitting on the side of a flat bed without using bedrails?: Total Help needed moving to and from a bed to a chair (including a wheelchair)?:  Total Help needed standing up from a chair using your arms (e.g., wheelchair or bedside chair)?: Total Help needed to walk in hospital room?: Total Help needed climbing 3-5 steps with a railing? : Total 6 Click Score: 6    End of Session     Patient left: in bed;with call bell/phone within reach;with nursing/sitter in room;with bed alarm set Nurse Communication: Mobility status PT Visit Diagnosis: Muscle weakness (generalized) (M62.81);Difficulty in walking, not elsewhere classified (R26.2)    Time: 1421-1501 PT Time Calculation (min) (ACUTE ONLY): 40 min   Charges:   PT Evaluation $PT Eval Moderate Complexity: 1 Mod PT Treatments $Therapeutic Activity: 23-37 mins       Verdene Lennert, PT, DPT  Acute Rehabilitation Ortho Tech Supervisor [phone] pager [phone]) [phone] office

## 2021-04-21 ENCOUNTER — Inpatient Hospital Stay (HOSPITAL_COMMUNITY): Payer: PPO

## 2021-04-21 DIAGNOSIS — I10 Essential (primary) hypertension: Secondary | ICD-10-CM

## 2021-04-21 LAB — URINALYSIS, MICROSCOPIC (REFLEX)
Bacteria, UA: NONE SEEN
Squamous Epithelial / HPF: NONE SEEN (ref 0–5)

## 2021-04-21 LAB — URINALYSIS, ROUTINE W REFLEX MICROSCOPIC
Glucose, UA: NEGATIVE mg/dL
Hgb urine dipstick: NEGATIVE
Ketones, ur: >80 mg/dL — AB
Leukocytes,Ua: NEGATIVE
Nitrite: NEGATIVE
Protein, ur: 30 mg/dL — AB
Specific Gravity, Urine: 1.025 (ref 1.005–1.030)
pH: 6 (ref 5.0–8.0)

## 2021-04-21 LAB — CBC
HCT: 38.2 % — ABNORMAL LOW (ref 39.0–52.0)
Hemoglobin: 12.2 g/dL — ABNORMAL LOW (ref 13.0–17.0)
MCH: 28.1 pg (ref 26.0–34.0)
MCHC: 31.9 g/dL (ref 30.0–36.0)
MCV: 88 fL (ref 80.0–100.0)
Platelets: 259 10*3/uL (ref 150–400)
RBC: 4.34 MIL/uL (ref 4.22–5.81)
RDW: 19.7 % — ABNORMAL HIGH (ref 11.5–15.5)
WBC: 29 10*3/uL — ABNORMAL HIGH (ref 4.0–10.5)
nRBC: 0 % (ref 0.0–0.2)

## 2021-04-21 LAB — COMPREHENSIVE METABOLIC PANEL
ALT: 16 U/L (ref 0–44)
AST: 26 U/L (ref 15–41)
Albumin: 2.6 g/dL — ABNORMAL LOW (ref 3.5–5.0)
Alkaline Phosphatase: 91 U/L (ref 38–126)
Anion gap: 26 — ABNORMAL HIGH (ref 5–15)
BUN: 19 mg/dL (ref 8–23)
CO2: 16 mmol/L — ABNORMAL LOW (ref 22–32)
Calcium: 8.6 mg/dL — ABNORMAL LOW (ref 8.9–10.3)
Chloride: 98 mmol/L (ref 98–111)
Creatinine, Ser: 2.01 mg/dL — ABNORMAL HIGH (ref 0.61–1.24)
GFR, Estimated: 31 mL/min — ABNORMAL LOW (ref >60)
Glucose, Bld: 104 mg/dL — ABNORMAL HIGH (ref 70–99)
Potassium: 3.2 mmol/L — ABNORMAL LOW (ref 3.5–5.1)
Sodium: 140 mmol/L (ref 135–145)
Total Bilirubin: 1.6 mg/dL — ABNORMAL HIGH (ref 0.3–1.2)
Total Protein: 6 g/dL — ABNORMAL LOW (ref 6.5–8.1)

## 2021-04-21 LAB — MAGNESIUM: Magnesium: 1.6 mg/dL — ABNORMAL LOW (ref 1.7–2.4)

## 2021-04-21 LAB — C DIFFICILE (CDIFF) QUICK SCRN (NO PCR REFLEX)
C Diff antigen: POSITIVE — AB
C Diff toxin: NEGATIVE

## 2021-04-21 MED ORDER — LACTATED RINGERS IV BOLUS
500.0000 mL | Freq: Once | INTRAVENOUS | Status: AC
  Administered 2021-04-21: 500 mL via INTRAVENOUS

## 2021-04-21 MED ORDER — MAGNESIUM SULFATE 4 GM/100ML IV SOLN
4.0000 g | Freq: Once | INTRAVENOUS | Status: AC
  Administered 2021-04-21: 4 g via INTRAVENOUS
  Filled 2021-04-21: qty 100

## 2021-04-21 MED ORDER — ENOXAPARIN SODIUM 30 MG/0.3ML IJ SOSY
30.0000 mg | PREFILLED_SYRINGE | INTRAMUSCULAR | Status: DC
  Administered 2021-04-22: 30 mg via SUBCUTANEOUS
  Filled 2021-04-21: qty 0.3

## 2021-04-21 MED ORDER — POTASSIUM CHLORIDE CRYS ER 20 MEQ PO TBCR
40.0000 meq | EXTENDED_RELEASE_TABLET | Freq: Four times a day (QID) | ORAL | Status: AC
  Administered 2021-04-21 (×2): 40 meq via ORAL
  Filled 2021-04-21 (×2): qty 2

## 2021-04-21 NOTE — Progress Notes (Signed)
PROGRESS NOTE    Leon Estes  [id number] DOB: [birth year] DOA: 04/18/2021 PCP: Venia Carbon, MD    Brief Narrative:  87 year old with a history of COPD, CAD, Schwanoma, HTN, HLD, severe visual loss, HOH, recent acute on chronic cholecystitis, and recurrent [name] episodes who was brought to the ER after he developed altered mental status/confusion.  In the ED MRI ruled out the possibility of an acute CVA.  Assessment & Plan:   Principal Problem:   Acute metabolic encephalopathy Active Problems:   Benign essential HTN   Macular degeneration, wet (HCC)   HOH (hard of hearing)   Recurrent Clostridioides difficile diarrhea   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)   Cholecystitis   Toxic metabolic encephalopathy    Acute toxic metabolic encephalopathy MR brain without acute findings -TSH normal -UA unrevealing -ammonia and B12 normal -UA unremarkable -no ultrasound findings to suggest acute cholecystitis -Appreciate input by Neurology . Concerns for possible underlying dementia, recommendation for outpatient neuropsych testing -Cont trial of seroquel, cont to hold gabapentin -Given new dehydration and concerns of infectious diarrhea, will continue IVF per below  Diarrhea -Over 10 bowel movements reported overnight -Patient has a hx of c.diff colitis 5/22 -WBC is acutely elevated at around 30k -Clinically dehydrated. Will continue IVF hydration as tolerated -Stool culture pending. Cdiff antigen is pos, toxin neg. Await NAAT  Dehydration -Likely related to diarrheal episodes overnight -Continue IVF hydration as tolerated  ARF -Cr is up to 2.01 today -Likely secondary to above dehydration -Cont IVF hydration as tolerated -Repeat bmet in AM   HTN -BP stable at present -Cont IVF as tolerated   COPD Seems to be compensated at this time   Schwannoma No evidence of an acute complicating issue   Severe visual loss and  hearing loss   Chronic cholecystitis   History of recurrent [name]    DVT prophylaxis: Lovenox subq Code Status: DNR Family Communication: Pt in room, family not at bedside  Status is: Inpatient  Remains inpatient appropriate because:Altered mental status and Inpatient level of care appropriate due to severity of illness  Dispo: The patient is from: Home              Anticipated d/c is to: SNF              Patient currently is not medically stable to d/c.   Difficult to place patient No   Consultants:  Neurology  Procedures:    Antimicrobials: Anti-infectives (From admission, onward)    None       Subjective: Multiple episodes of watery diarrhea noted overnight into today  Objective: Vitals:   04/21/21 1023 04/21/21 1127 04/21/21 1300 04/21/21 1514  BP: 126/76 (!) 88/62  (!) 101/51  Pulse: (!) 120 (!) 127  (!) 111  Resp:      Temp: 98.6 F (37 C)   98.3 F (36.8 C)  TempSrc: Axillary   Oral  SpO2:    92%  Weight:   64.9 kg     Intake/Output Summary (Last 24 hours) at 04/21/2021 1602 Last data filed at 04/21/2021 1200 Gross per 24 hour  Intake 950.9 ml  Output --  Net 950.9 ml   Filed Weights   04/21/21 1300  Weight: 64.9 kg    Examination: General exam: Conversant, in no acute distress Respiratory system: normal chest rise, clear, no audible wheezing Cardiovascular system: regular rhythm, s1-s2 Gastrointestinal system: Nondistended, nontender, pos BS Central nervous  system: No seizures, no tremors Extremities: No cyanosis, no joint deformities Skin: No rashes, no pallor Psychiatry: Affect normal // no auditory hallucinations   Data Reviewed: I have personally reviewed following labs and imaging studies  CBC: Recent Labs  Lab 04/18/21 2102 04/18/21 2113 04/19/21 1930 04/21/21 0254  WBC 10.9*  --  4.4 29.0*  NEUTROABS 5.6  --   --   --   HGB 11.0* 12.6* 10.1* 12.2*  HCT 36.4* 37.0* 28.6* 38.2*  MCV 89.4  --  96.6 88.0  PLT  290  --  288 [id number]    Basic Metabolic Panel: Recent Labs  Lab 04/18/21 2102 04/18/21 2113 04/21/21 0254 04/21/21 0904  NA 137 138 140  --   K 5.0 5.0 3.2*  --   CL 100 102 98  --   CO2 28  --  16*  --   GLUCOSE 110* 105* 104*  --   BUN '12 14 19  '$ --   CREATININE 1.22 1.20 2.01*  --   CALCIUM 9.1  --  8.6*  --   MG  --   --   --  1.6*    GFR: Estimated Creatinine Clearance: 23.3 mL/min (A) (by C-G formula based on SCr of 2.01 mg/dL (H)). Liver Function Tests: Recent Labs  Lab 04/18/21 2102 04/21/21 0254  AST 35 26  ALT 17 16  ALKPHOS 86 91  BILITOT 0.6 1.6*  PROT 6.7 6.0*  ALBUMIN 3.1* 2.6*    No results for input(s): LIPASE, AMYLASE in the last 168 hours. Recent Labs  Lab 04/18/21 2250  AMMONIA 10    Coagulation Profile: Recent Labs  Lab 04/18/21 2102  INR 1.0    Cardiac Enzymes: No results for input(s): CKTOTAL, CKMB, CKMBINDEX, TROPONINI in the last 168 hours. BNP (last 3 results) No results for input(s): PROBNP in the last 8760 hours. HbA1C: No results for input(s): HGBA1C in the last 72 hours. CBG: Recent Labs  Lab 04/18/21 2103  GLUCAP 100*    Lipid Profile: No results for input(s): CHOL, HDL, LDLCALC, TRIG, CHOLHDL, LDLDIRECT in the last 72 hours. Thyroid Function Tests: Recent Labs    04/18/21 2250  TSH 1.050    Anemia Panel: Recent Labs    04/18/21 2250  VITAMINB12 692    Sepsis Labs: Recent Labs  Lab 04/19/21 0043 04/19/21 0101  PROCALCITON  --  <0.10  LATICACIDVEN 1.6  --      Recent Results (from the past 240 hour(s))  Resp Panel by RT-PCR (Flu A&B, Covid)     Status: None   Collection Time: 04/18/21 10:50 PM   Specimen: Nasopharyngeal(NP) swabs in vial transport medium  Result Value Ref Range Status   SARS Coronavirus 2 by RT PCR NEGATIVE NEGATIVE Final    Comment: (NOTE) SARS-CoV-2 target nucleic acids are NOT DETECTED.  The SARS-CoV-2 RNA is generally detectable in upper respiratory specimens during the  acute phase of infection. The lowest concentration of SARS-CoV-2 viral copies this assay can detect is 138 copies/mL. A negative result does not preclude SARS-Cov-2 infection and should not be used as the sole basis for treatment or other patient management decisions. A negative result may occur with  improper specimen collection/handling, submission of specimen other than nasopharyngeal swab, presence of viral mutation(s) within the areas targeted by this assay, and inadequate number of viral copies(<138 copies/mL). A negative result must be combined with clinical observations, patient history, and epidemiological information. The expected result is Negative.  Fact Sheet for Patients:  EntrepreneurPulse.com.au  Fact Sheet for Healthcare Providers:  IncredibleEmployment.be  This test is no t yet approved or cleared by the [place] FDA and  has been authorized for detection and/or diagnosis of SARS-CoV-2 by FDA under an Emergency Use Authorization (EUA). This EUA will remain  in effect (meaning this test can be used) for the duration of the COVID-19 declaration under Section 564(b)(1) of the Act, 21 U.S.C.section 360bbb-3(b)(1), unless the authorization is terminated  or revoked sooner.       Influenza A by PCR NEGATIVE NEGATIVE Final   Influenza B by PCR NEGATIVE NEGATIVE Final    Comment: (NOTE) The Xpert Xpress SARS-CoV-2/FLU/RSV plus assay is intended as an aid in the diagnosis of influenza from Nasopharyngeal swab specimens and should not be used as a sole basis for treatment. Nasal washings and aspirates are unacceptable for Xpert Xpress SARS-CoV-2/FLU/RSV testing.  Fact Sheet for Patients: EntrepreneurPulse.com.au  Fact Sheet for Healthcare Providers: IncredibleEmployment.be  This test is not yet approved or cleared by the [place] FDA and has been authorized for detection and/or  diagnosis of SARS-CoV-2 by FDA under an Emergency Use Authorization (EUA). This EUA will remain in effect (meaning this test can be used) for the duration of the COVID-19 declaration under Section 564(b)(1) of the Act, 21 U.S.C. section 360bbb-3(b)(1), unless the authorization is terminated or revoked.  Performed at [facility], [place] [address], [place], [place] [address]   C Difficile Quick Screen (NO PCR Reflex)     Status: Abnormal   Collection Time: 04/21/21 12:00 PM   Specimen: Urine, Clean Catch; Stool  Result Value Ref Range Status   C Diff antigen POSITIVE (A) NEGATIVE Final   C Diff toxin NEGATIVE NEGATIVE Final   C Diff interpretation   Final    Results are indeterminate. Please contact the provider listed for your campus for C diff questions in [place].    Comment: Performed at [facility], [place] [address], [place], [place] [address]      Radiology Studies: US RENAL  Result Date: 04/21/2021 CLINICAL DATA:  Acute renal failure EXAM: RENAL / URINARY TRACT ULTRASOUND COMPLETE COMPARISON:  CT abdomen pelvis 01/02/2021 FINDINGS: Right Kidney: Renal measurements: 9.6 x 4.6 x 3.5 cm = volume: 81 mL. Echogenicity within normal limits. No mass or hydronephrosis visualized. Left Kidney: Renal measurements: 9.3 x 4.7 x 3.5 cm = volume: 81 mL. Echogenicity within normal limits. No mass or hydronephrosis visualized. Bladder: Unable to visualize. Other: Exam limited as patient was combative. IMPRESSION: No significant sonographic abnormality of the kidneys. Electronically Signed   By: Miachel Roux M.D.   On: 04/21/2021 12:42   DG CHEST PORT 1 VIEW  Result Date: 04/21/2021 CLINICAL DATA:  Pneumonia EXAM: PORTABLE CHEST 1 VIEW COMPARISON:  04/18/2021, 04/03/2021 FINDINGS: Heart size. Atherosclerotic calcification of the aortic knob. Subtle reticulonodular opacity within the periphery of the right upper lobe. No pleural effusion or pneumothorax. High-riding right humeral head.  IMPRESSION: Subtle reticulonodular opacity within the periphery of the right upper lobe suspicious for developing infection. Electronically Signed   By: Davina Poke D.O.   On: 04/21/2021 11:20    Scheduled Meds:  [START ON 04/22/2021] enoxaparin (LOVENOX) injection  30 mg Subcutaneous Q24H   mometasone-formoterol  2 puff Inhalation BID   pantoprazole  40 mg Oral Daily   QUEtiapine  25 mg Oral QHS   tamsulosin  0.4 mg Oral QHS   Continuous Infusions:  lactated ringers 500 mL/hr at 04/21/21 [phone]  LOS: 1 day   Marylu Lund, MD [facility] Pager On Amion  If 7PM-7AM, please contact night-coverage 04/21/2021, 4:02 PM

## 2021-04-21 NOTE — Evaluation (Signed)
Occupational Therapy Evaluation Patient Details Name: Leon Estes MRN: [id number] DOB: 1931/04/26 Today's Date: 04/21/2021    History of Present Illness 87 y.o. male admitted on 04/18/21 for L sided weakness, confusion, aphasia.  Pt dx with encepholopathy of unknown origin, medical workup continues.  MRI stable (shows Schwannoma, but stable compared to last scan).  Pt with signiciant PMH of cataract, COPD, CAD, hearing loss, HTN, macular degeneration.   Clinical Impression   This 87 yo male admitted with above presents to acute OT at total A level for all basic ADLs and mobility due to his confusion. PTA he was able to walk with a RW and his own basic ADLs. He will continue to benefit from acute OT with follow up OT at SNF.    Follow Up Recommendations  SNF;Supervision/Assistance - 24 hour    Equipment Recommendations  Other (comment) (TBD next venue)       Precautions / Restrictions Precautions Precautions: Fall Precaution Comments: has been known to kick and hit (per RN) this admission Restrictions Weight Bearing Restrictions: No      Mobility Bed Mobility               General bed mobility comments: Pt would not move for me or allow me to assist him to move (even in bed)                                                             ADL either performed or assessed with clinical judgement   ADL                                         General ADL Comments: Total A     Vision Baseline Vision/History: 6 Macular Degeneration Additional Comments: Continue to assess--pt tendency to keep eyes closed, has macular degeneration            Pertinent Vitals/Pain Pain Assessment: Faces Faces Pain Scale: No hurt     Hand Dominance Right   Extremity/Trunk Assessment Upper Extremity Assessment Upper Extremity Assessment: Generalized weakness (moves both arms spontaneously, but not on command)           Communication  Communication Communication: HOH   Cognition Arousal/Alertness: Lethargic Behavior During Therapy: Flat affect Overall Cognitive Status: Impaired/Different from baseline Area of Impairment: Safety/judgement                         Safety/Judgement: Decreased awareness of safety;Decreased awareness of deficits     General Comments: Pt could tell me his last name, birthday, and his wife's name--but would not follow any commands. When I attempted to pull his gown DOWN to cover him up, he reached with his mitted hand at his genitals and said "no, no, no"              Home Living Family/patient expects to be discharged to:: Skilled nursing facility Living Arrangements: Spouse/significant other Available Help at Discharge: Family;Available 24 hours/day Type of Home: House Home Access: Stairs to enter [employer] of Steps: 2   Home Layout: Two level;Able to live on main level with bedroom/bathroom     Bathroom Shower/Tub: Walk-in shower  Home Equipment: [place] - 2 wheels;Cane - single point;Walker - 4 wheels;Shower seat          Prior Functioning/Environment Level of Independence: Needs assistance  Gait / Transfers Assistance Needed: pt ambulates with RW, follows wife around the house, but does not need assist, mostly supervision. ADL's / Homemaking Assistance Needed: wife reports he does his own bathing and dressing, and helps her with light chores. Communication / Swallowing Assistance Needed: normal at baseline, HOH, no hearing aids          OT Problem List: Impaired balance (sitting and/or standing);Impaired vision/perception;Decreased cognition;Decreased safety awareness      OT Treatment/Interventions: DME and/or AE instruction;Patient/family education;Balance training    OT Goals(Current goals can be found in the care plan section) Acute Rehab OT Goals Patient Stated Goal: pt unable OT Goal Formulation: Patient unable to participate  in goal setting Time For Goal Achievement: 05/05/21 Potential to Achieve Goals: Fair  OT Frequency: Min 2X/week    AM-PAC OT "6 Clicks" Daily Activity     Outcome Measure Help from another person eating meals?: Total Help from another person taking care of personal grooming?: Total Help from another person toileting, which includes using toliet, bedpan, or urinal?: Total Help from another person bathing (including washing, rinsing, drying)?: Total Help from another person to put on and taking off regular upper body clothing?: Total Help from another person to put on and taking off regular lower body clothing?: Total 6 Click Score: 6   End of Session    Activity Tolerance: Patient limited by lethargy;Treatment limited secondary to agitation Patient left: in bed;with call bell/phone within reach;with bed alarm set  OT Visit Diagnosis: Other abnormalities of gait and mobility (R26.89);Other symptoms and signs involving cognitive function;Low vision, both eyes (H54.2)                Time: [id number] OT Time Calculation (min): 16 min Charges:  OT General Charges $OT Visit: 1 Visit OT Evaluation $OT Eval Low Complexity: 1 Low  [place], OTR/L Acute [employer] Pager [phone] Office [phone]    Almon Register 04/21/2021, 3:30 PM

## 2021-04-21 NOTE — Progress Notes (Signed)
CSW attempted to contact patient's wife for assessment. Unable to leave a message with cell phone number, left a voicemail on home number. Awaiting call back.  Laveda Abbe, [place] Clinical Social Worker [phone]

## 2021-04-21 NOTE — Progress Notes (Signed)
   04/21/21 0206  Assess: MEWS Score  Temp 98.4 F (36.9 C)  BP 130/63  Pulse Rate (!) 125  Resp 18  SpO2 99 %  O2 Device Room Air  Assess: MEWS Score  MEWS Temp 0  MEWS Systolic 0  MEWS Pulse 2  MEWS RR 0  MEWS LOC 0  MEWS Score 2  MEWS Score Color Yellow  Assess: if the MEWS score is Yellow or Red  Were vital signs taken at a resting state? Yes  Focused Assessment No change from prior assessment  Early Detection of Sepsis Score *See Row Information* Medium  MEWS guidelines implemented *See Row Information* Yes  Treat  Pain Scale 0-10  Pain Score 0  Faces Pain Scale 0  Complains of Agitation  Neuro symptoms relieved by Rest  Take Vital Signs  Increase Vital Sign Frequency  Yellow: Q 2hr X 2 then Q 4hr X 2, if remains yellow, continue Q 4hrs  Escalate  MEWS: Escalate Yellow: discuss with charge nurse/RN and consider discussing with provider and RRT  Notify: Charge Nurse/RN  Name of Charge Nurse/RN Notified Caedyn Raygoza RN  Date Charge Nurse/RN Notified 04/21/21  Time Charge Nurse/RN Notified 0400

## 2021-04-21 NOTE — Progress Notes (Signed)
MD notified of pt's positive c.diff results. Also notified of MEWs in red this a.m. new orders obtained.

## 2021-04-22 LAB — CLOSTRIDIUM DIFFICILE BY PCR, REFLEXED: Toxigenic C. Difficile by PCR: POSITIVE — AB

## 2021-04-22 LAB — COMPREHENSIVE METABOLIC PANEL
ALT: 16 U/L (ref 0–44)
AST: 29 U/L (ref 15–41)
Albumin: 2.3 g/dL — ABNORMAL LOW (ref 3.5–5.0)
Alkaline Phosphatase: 63 U/L (ref 38–126)
Anion gap: 17 — ABNORMAL HIGH (ref 5–15)
BUN: 46 mg/dL — ABNORMAL HIGH (ref 8–23)
CO2: 22 mmol/L (ref 22–32)
Calcium: 8.3 mg/dL — ABNORMAL LOW (ref 8.9–10.3)
Chloride: 102 mmol/L (ref 98–111)
Creatinine, Ser: 2.19 mg/dL — ABNORMAL HIGH (ref 0.61–1.24)
GFR, Estimated: 28 mL/min — ABNORMAL LOW (ref >60)
Glucose, Bld: 109 mg/dL — ABNORMAL HIGH (ref 70–99)
Potassium: 2.5 mmol/L — CL (ref 3.5–5.1)
Sodium: 141 mmol/L (ref 135–145)
Total Bilirubin: 0.9 mg/dL (ref 0.3–1.2)
Total Protein: 5.2 g/dL — ABNORMAL LOW (ref 6.5–8.1)

## 2021-04-22 LAB — BLOOD CULTURE ID PANEL (REFLEXED) - BCID2

## 2021-04-22 LAB — CBC
HCT: 29.6 % — ABNORMAL LOW (ref 39.0–52.0)
Hemoglobin: 9.6 g/dL — ABNORMAL LOW (ref 13.0–17.0)
MCH: 27.7 pg (ref 26.0–34.0)
MCHC: 32.4 g/dL (ref 30.0–36.0)
MCV: 85.5 fL (ref 80.0–100.0)
Platelets: 242 10*3/uL (ref 150–400)
RBC: 3.46 MIL/uL — ABNORMAL LOW (ref 4.22–5.81)
RDW: 19.5 % — ABNORMAL HIGH (ref 11.5–15.5)
WBC: 8.6 10*3/uL (ref 4.0–10.5)
nRBC: 0 % (ref 0.0–0.2)

## 2021-04-22 LAB — MAGNESIUM: Magnesium: 3 mg/dL — ABNORMAL HIGH (ref 1.7–2.4)

## 2021-04-22 MED ORDER — FIDAXOMICIN 200 MG PO TABS
200.0000 mg | ORAL_TABLET | Freq: Two times a day (BID) | ORAL | Status: DC
  Filled 2021-04-22 (×3): qty 1

## 2021-04-22 MED ORDER — VANCOMYCIN HCL 125 MG PO CAPS: 125.0000 mg | ORAL_CAPSULE | ORAL | Status: DC

## 2021-04-22 MED ORDER — VANCOMYCIN HCL 125 MG PO CAPS: 125.0000 mg | ORAL_CAPSULE | Freq: Two times a day (BID) | ORAL | Status: DC

## 2021-04-22 MED ORDER — VANCOMYCIN HCL 125 MG PO CAPS
125.0000 mg | ORAL_CAPSULE | ORAL | Status: DC
Start: 2021-06-18 — End: 2021-04-22

## 2021-04-22 MED ORDER — VANCOMYCIN HCL 125 MG PO CAPS: 125.0000 mg | ORAL_CAPSULE | Freq: Every day | ORAL | Status: DC

## 2021-04-22 MED ORDER — POTASSIUM CHLORIDE 10 MEQ/100ML IV SOLN
10.0000 meq | INTRAVENOUS | Status: AC
  Administered 2021-04-22 (×6): 10 meq via INTRAVENOUS
  Filled 2021-04-22 (×6): qty 100

## 2021-04-22 MED ORDER — VANCOMYCIN HCL 125 MG PO CAPS
125.0000 mg | ORAL_CAPSULE | Freq: Four times a day (QID) | ORAL | Status: DC
  Administered 2021-04-22: 125 mg via ORAL
  Filled 2021-04-22 (×2): qty 1

## 2021-04-22 NOTE — Plan of Care (Signed)

## 2021-04-22 NOTE — Treatment Plan (Signed)
Discussed case with ID over phone regarding recurrent Cdiff. Recommendation to continue on Dificid. Will d/c previous PO vanc order

## 2021-04-22 NOTE — Progress Notes (Signed)
PHARMACY - PHYSICIAN COMMUNICATION CRITICAL VALUE ALERT - BLOOD CULTURE IDENTIFICATION (BCID)  Leon Estes is an 87 y.o. male who presented to [facility] on 04/18/2021 with a chief complaint of AMS/weakness Afebrile, WBC 8.6  Assessment:  1/2 blood cultures growing MR-Staphylococcus epidermidis--likely contaminant  Name of physician (or Provider) Contacted:  Dr. Tonie Griffith  Current antibiotics: None  Changes to prescribed antibiotics recommended:  No changes at this time  Results for orders placed or performed during the hospital encounter of 04/18/21  Blood Culture ID Panel (Reflexed) (Collected: 04/21/2021  9:14 AM)  Result Value Ref Range   Enterococcus faecalis NOT DETECTED NOT DETECTED   Enterococcus Faecium NOT DETECTED NOT DETECTED   Listeria monocytogenes NOT DETECTED NOT DETECTED   Staphylococcus species DETECTED (A) NOT DETECTED   Staphylococcus aureus (BCID) NOT DETECTED NOT DETECTED   Staphylococcus epidermidis DETECTED (A) NOT DETECTED   Staphylococcus lugdunensis NOT DETECTED NOT DETECTED   Streptococcus species NOT DETECTED NOT DETECTED   Streptococcus agalactiae NOT DETECTED NOT DETECTED   Streptococcus pneumoniae NOT DETECTED NOT DETECTED   Streptococcus pyogenes NOT DETECTED NOT DETECTED   A.calcoaceticus-baumannii NOT DETECTED NOT DETECTED   Bacteroides fragilis NOT DETECTED NOT DETECTED   Enterobacterales NOT DETECTED NOT DETECTED   Enterobacter cloacae complex NOT DETECTED NOT DETECTED   Escherichia coli NOT DETECTED NOT DETECTED   Klebsiella aerogenes NOT DETECTED NOT DETECTED   Klebsiella oxytoca NOT DETECTED NOT DETECTED   Klebsiella pneumoniae NOT DETECTED NOT DETECTED   Proteus species NOT DETECTED NOT DETECTED   Salmonella species NOT DETECTED NOT DETECTED   Serratia marcescens NOT DETECTED NOT DETECTED   Haemophilus influenzae NOT DETECTED NOT DETECTED   Neisseria meningitidis NOT DETECTED NOT DETECTED   Pseudomonas aeruginosa NOT DETECTED NOT  DETECTED   Stenotrophomonas maltophilia NOT DETECTED NOT DETECTED   Candida albicans NOT DETECTED NOT DETECTED   Candida auris NOT DETECTED NOT DETECTED   Candida glabrata NOT DETECTED NOT DETECTED   Candida krusei NOT DETECTED NOT DETECTED   Candida parapsilosis NOT DETECTED NOT DETECTED   Candida tropicalis NOT DETECTED NOT DETECTED   Cryptococcus neoformans/gattii NOT DETECTED NOT DETECTED   Methicillin resistance mecA/C DETECTED (A) NOT DETECTED    Leon Estes 04/22/2021  4:04 AM

## 2021-04-22 NOTE — Progress Notes (Addendum)
On call MD paged r/t pt's K+ 2.5. Pt still with loose yellow watery stool. VSS. Orders received.

## 2021-04-22 NOTE — Progress Notes (Signed)
PROGRESS NOTE    Leon Estes  [id number] DOB: 02/24/1931 DOA: 04/18/2021 PCP: Venia Carbon, MD    Brief Narrative:  87 year old with a history of COPD, CAD, Schwanoma, HTN, HLD, severe visual loss, HOH, recent acute on chronic cholecystitis, and recurrent [name] episodes who was brought to the ER after he developed altered mental status/confusion.  In the ED MRI ruled out the possibility of an acute CVA.  Assessment & Plan:   Principal Problem:   Acute metabolic encephalopathy Active Problems:   Benign essential HTN   Macular degeneration, wet (HCC)   HOH (hard of hearing)   Recurrent Clostridioides difficile diarrhea   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)   Cholecystitis   Toxic metabolic encephalopathy    Acute toxic metabolic encephalopathy MR brain without acute findings -TSH normal -UA unrevealing -ammonia and B12 normal -UA unremarkable -no ultrasound findings to suggest acute cholecystitis -Appreciate input by Neurology . Concerns for possible underlying dementia, recommendation for outpatient neuropsych testing -Cont trial of seroquel, cont to hold gabapentin -Patient seems more alert and conversive today.  Overall mentation appears better.  Diarrhea secondary to [name], recurrent -Patient continues with multiple watery stools resulting in electrolyte abnormalities -Patient has a hx of c.diff colitis 5/22 -WBC is recently elevated at around 30k, today better -Continue IV fluid hydration as tolerated -C. difficile testing was initially inconclusive, confirmatory test now positive for [name] -We will continue patient on extended course of oral vancomycin -Have discontinued proton pump inhibitor  Dehydration -Likely related to diarrheal episodes per above -Mucous membranes do appear dry -Continue IVF hydration as tolerated  ARF -Cr is up to 2.19 today -Likely secondary to diarrhea  above -Continue IV fluid hydration as tolerated   HTN -BP stable at present -Cont IVF as tolerated   COPD Seems to be compensated at this time   Schwannoma No evidence of an acute complicating issue   Severe visual loss and hearing loss   Chronic cholecystitis     DVT prophylaxis: Lovenox subq Code Status: DNR Family Communication: Pt in room, family not at bedside  Status is: Inpatient  Remains inpatient appropriate because:Altered mental status and Inpatient level of care appropriate due to severity of illness  Dispo: The patient is from: Home              Anticipated d/c is to: SNF              Patient currently is not medically stable to d/c.   Difficult to place patient No   Consultants:  Neurology  Procedures:    Antimicrobials: Anti-infectives (From admission, onward)    Start     Dose/Rate Route Frequency Ordered Stop   06/18/21 1000  vancomycin (VANCOCIN) capsule 125 mg       See Hyperspace for full Linked Orders Report.   125 mg Oral Every 3 DAYS 04/22/21 1440 07/18/21 0959   05/21/21 1000  vancomycin (VANCOCIN) capsule 125 mg       See Hyperspace for full Linked Orders Report.   125 mg Oral Every other day 04/22/21 1440 06/18/21 0959   05/14/21 1000  vancomycin (VANCOCIN) capsule 125 mg       See Hyperspace for full Linked Orders Report.   125 mg Oral Daily 04/22/21 1440 05/21/21 0959   05/06/21 2200  vancomycin (VANCOCIN) capsule 125 mg       See Hyperspace for full Linked Orders Report.  125 mg Oral 2 times daily 04/22/21 1440 05/13/21 2159   04/22/21 1800  vancomycin (VANCOCIN) capsule 125 mg       See Hyperspace for full Linked Orders Report.   125 mg Oral 4 times daily 04/22/21 1440 05/06/21 1759       Subjective: Complaining of ongoing diarrhea  Objective: Vitals:   04/21/21 1930 04/21/21 2200 04/21/21 2316 04/22/21 0400  BP: (!) 126/57  (!) 115/51 (!) 98/36  Pulse: (!) 109 (!) 105 92 67  Resp: '19 16 17 18  '$ Temp: 98.6 F (37 C)   98.2 F (36.8 C) 98.2 F (36.8 C)  TempSrc: Oral  Oral Oral  SpO2: 98%   98%  Weight:        Intake/Output Summary (Last 24 hours) at 04/22/2021 1701 Last data filed at 04/22/2021 [id number] Gross per 24 hour  Intake 767.75 ml  Output 150 ml  Net 617.75 ml    Filed Weights   04/21/21 1300  Weight: 64.9 kg    Examination: General exam: Awake, laying in bed, in nad Respiratory system: Normal respiratory effort, no wheezing Cardiovascular system: regular rate, s1, s2 Gastrointestinal system: Soft, nondistended, positive BS Central nervous system: CN2-12 grossly intact, strength intact Extremities: Perfused, no clubbing Skin: Normal skin turgor, no notable skin lesions seen Psychiatry: Mood normal // no visual hallucinations   Data Reviewed: I have personally reviewed following labs and imaging studies  CBC: Recent Labs  Lab 04/18/21 2102 04/18/21 2113 04/19/21 1930 04/21/21 0254 04/22/21 0227  WBC 10.9*  --  4.4 29.0* 8.6  NEUTROABS 5.6  --   --   --   --   HGB 11.0* 12.6* 10.1* 12.2* 9.6*  HCT 36.4* 37.0* 28.6* 38.2* 29.6*  MCV 89.4  --  96.6 88.0 85.5  PLT 290  --  288 259 [id number]    Basic Metabolic Panel: Recent Labs  Lab 04/18/21 2102 04/18/21 2113 04/21/21 0254 04/21/21 0904 04/22/21 0227  NA 137 138 140  --  141  K 5.0 5.0 3.2*  --  2.5*  CL 100 102 98  --  102  CO2 28  --  16*  --  22  GLUCOSE 110* 105* 104*  --  109*  BUN '12 14 19  '$ --  46*  CREATININE 1.22 1.20 2.01*  --  2.19*  CALCIUM 9.1  --  8.6*  --  8.3*  MG  --   --   --  1.6* 3.0*    GFR: Estimated Creatinine Clearance: 21.4 mL/min (A) (by C-G formula based on SCr of 2.19 mg/dL (H)). Liver Function Tests: Recent Labs  Lab 04/18/21 2102 04/21/21 0254 04/22/21 0227  AST 35 26 29  ALT '17 16 16  '$ ALKPHOS 86 91 63  BILITOT 0.6 1.6* 0.9  PROT 6.7 6.0* 5.2*  ALBUMIN 3.1* 2.6* 2.3*    No results for input(s): LIPASE, AMYLASE in the last 168 hours. Recent Labs  Lab 04/18/21 2250  AMMONIA  10    Coagulation Profile: Recent Labs  Lab 04/18/21 2102  INR 1.0    Cardiac Enzymes: No results for input(s): CKTOTAL, CKMB, CKMBINDEX, TROPONINI in the last 168 hours. BNP (last 3 results) No results for input(s): PROBNP in the last 8760 hours. HbA1C: No results for input(s): HGBA1C in the last 72 hours. CBG: Recent Labs  Lab 04/18/21 2103  GLUCAP 100*    Lipid Profile: No results for input(s): CHOL, HDL, LDLCALC, TRIG, CHOLHDL, LDLDIRECT in the last 72 hours. Thyroid  Function Tests: No results for input(s): TSH, T4TOTAL, FREET4, T3FREE, THYROIDAB in the last 72 hours.  Anemia Panel: No results for input(s): VITAMINB12, FOLATE, FERRITIN, TIBC, IRON, RETICCTPCT in the last 72 hours.  Sepsis Labs: Recent Labs  Lab 04/19/21 0043 04/19/21 0101  PROCALCITON  --  <0.10  LATICACIDVEN 1.6  --      Recent Results (from the past 240 hour(s))  Resp Panel by RT-PCR (Flu A&B, Covid)     Status: None   Collection Time: 04/18/21 10:50 PM   Specimen: Nasopharyngeal(NP) swabs in vial transport medium  Result Value Ref Range Status   SARS Coronavirus 2 by RT PCR NEGATIVE NEGATIVE Final    Comment: (NOTE) SARS-CoV-2 target nucleic acids are NOT DETECTED.  The SARS-CoV-2 RNA is generally detectable in upper respiratory specimens during the acute phase of infection. The lowest concentration of SARS-CoV-2 viral copies this assay can detect is 138 copies/mL. A negative result does not preclude SARS-Cov-2 infection and should not be used as the sole basis for treatment or other patient management decisions. A negative result may occur with  improper specimen collection/handling, submission of specimen other than nasopharyngeal swab, presence of viral mutation(s) within the areas targeted by this assay, and inadequate number of viral copies(<138 copies/mL). A negative result must be combined with clinical observations, patient history, and epidemiological information. The  expected result is Negative.  Fact Sheet for Patients:  EntrepreneurPulse.com.au  Fact Sheet for Healthcare Providers:  IncredibleEmployment.be  This test is no t yet approved or cleared by the [place] FDA and  has been authorized for detection and/or diagnosis of SARS-CoV-2 by FDA under an Emergency Use Authorization (EUA). This EUA will remain  in effect (meaning this test can be used) for the duration of the COVID-19 declaration under Section 564(b)(1) of the Act, 21 U.S.C.section 360bbb-3(b)(1), unless the authorization is terminated  or revoked sooner.       Influenza A by PCR NEGATIVE NEGATIVE Final   Influenza B by PCR NEGATIVE NEGATIVE Final    Comment: (NOTE) The Xpert Xpress SARS-CoV-2/FLU/RSV plus assay is intended as an aid in the diagnosis of influenza from Nasopharyngeal swab specimens and should not be used as a sole basis for treatment. Nasal washings and aspirates are unacceptable for Xpert Xpress SARS-CoV-2/FLU/RSV testing.  Fact Sheet for Patients: EntrepreneurPulse.com.au  Fact Sheet for Healthcare Providers: IncredibleEmployment.be  This test is not yet approved or cleared by the [place] FDA and has been authorized for detection and/or diagnosis of SARS-CoV-2 by FDA under an Emergency Use Authorization (EUA). This EUA will remain in effect (meaning this test can be used) for the duration of the COVID-19 declaration under Section 564(b)(1) of the Act, 21 U.S.C. section 360bbb-3(b)(1), unless the authorization is terminated or revoked.  Performed at [facility], [place] [address] [place], [place] [address]   Culture, blood (routine x 2)     Status: None (Preliminary result)   Collection Time: 04/21/21  9:04 AM   Specimen: Right Antecubital; Blood  Result Value Ref Range Status   Specimen Description RIGHT ANTECUBITAL  Final   Special Requests   Final    BOTTLES  DRAWN AEROBIC AND ANAEROBIC Blood Culture adequate volume   Culture   Final    NO GROWTH 1 DAY Performed at [facility], [place] [address], [place], [place] [address]    Report Status PENDING  Incomplete  Culture, blood (routine x 2)     Status: None (Preliminary result)  Collection Time: 04/21/21  9:14 AM   Specimen: BLOOD RIGHT ARM  Result Value Ref Range Status   Specimen Description BLOOD RIGHT ARM  Final   Special Requests   Final    BOTTLES DRAWN AEROBIC AND ANAEROBIC Blood Culture adequate volume   Culture  Setup Time   Final    GRAM POSITIVE COCCI IN BOTH AEROBIC AND ANAEROBIC BOTTLES CRITICAL RESULT CALLED TO, READ BACK BY AND VERIFIED WITH: PHARMD GREG ABBOTT 04/22/21'@3'$ :36 BY TW    Culture   Final    NO GROWTH 1 DAY Performed at [facility], [place] [address] [place], [place] [address]    Report Status PENDING  Incomplete  Blood Culture ID Panel (Reflexed)     Status: Abnormal   Collection Time: 04/21/21  9:14 AM  Result Value Ref Range Status   Enterococcus faecalis NOT DETECTED NOT DETECTED Final   Enterococcus Faecium NOT DETECTED NOT DETECTED Final   Listeria monocytogenes NOT DETECTED NOT DETECTED Final   Staphylococcus species DETECTED (A) NOT DETECTED Final    Comment: CRITICAL RESULT CALLED TO, READ BACK BY AND VERIFIED WITH: PHARMD GREG ABBOTT 04/22/21'@3'$ :36 BY TW    Staphylococcus aureus (BCID) NOT DETECTED NOT DETECTED Final   Staphylococcus epidermidis DETECTED (A) NOT DETECTED Final    Comment: Methicillin (oxacillin) resistant coagulase negative staphylococcus. Possible blood culture contaminant (unless isolated from more than one blood culture draw or clinical case suggests pathogenicity). No antibiotic treatment is indicated for blood  culture contaminants. CRITICAL RESULT CALLED TO, READ BACK BY AND VERIFIED WITH: PHARMD GREG ABBOTT 04/22/21'@3'$ :36 BY TW    Staphylococcus lugdunensis NOT DETECTED NOT DETECTED Final   Streptococcus species NOT  DETECTED NOT DETECTED Final   Streptococcus agalactiae NOT DETECTED NOT DETECTED Final   Streptococcus pneumoniae NOT DETECTED NOT DETECTED Final   Streptococcus pyogenes NOT DETECTED NOT DETECTED Final   A.calcoaceticus-baumannii NOT DETECTED NOT DETECTED Final   Bacteroides fragilis NOT DETECTED NOT DETECTED Final   Enterobacterales NOT DETECTED NOT DETECTED Final   Enterobacter cloacae complex NOT DETECTED NOT DETECTED Final   Escherichia coli NOT DETECTED NOT DETECTED Final   Klebsiella aerogenes NOT DETECTED NOT DETECTED Final   Klebsiella oxytoca NOT DETECTED NOT DETECTED Final   Klebsiella pneumoniae NOT DETECTED NOT DETECTED Final   Proteus species NOT DETECTED NOT DETECTED Final   Salmonella species NOT DETECTED NOT DETECTED Final   Serratia marcescens NOT DETECTED NOT DETECTED Final   Haemophilus influenzae NOT DETECTED NOT DETECTED Final   Neisseria meningitidis NOT DETECTED NOT DETECTED Final   Pseudomonas aeruginosa NOT DETECTED NOT DETECTED Final   Stenotrophomonas maltophilia NOT DETECTED NOT DETECTED Final   Candida albicans NOT DETECTED NOT DETECTED Final   Candida auris NOT DETECTED NOT DETECTED Final   Candida glabrata NOT DETECTED NOT DETECTED Final   Candida krusei NOT DETECTED NOT DETECTED Final   Candida parapsilosis NOT DETECTED NOT DETECTED Final   Candida tropicalis NOT DETECTED NOT DETECTED Final   Cryptococcus neoformans/gattii NOT DETECTED NOT DETECTED Final   Methicillin resistance mecA/C DETECTED (A) NOT DETECTED Final    Comment: CRITICAL RESULT CALLED TO, READ BACK BY AND VERIFIED WITH: PHARMD GREG ABBOTT 04/22/21'@3'$ :36 BY TW Performed at [facility], [address], [place], [place] [address]   C Difficile Quick Screen (NO PCR Reflex)     Status: Abnormal   Collection Time: 04/21/21 12:00 PM   Specimen: Urine, Clean Catch; Stool  Result Value Ref Range Status   C Diff  antigen POSITIVE (A) NEGATIVE Final   C Diff toxin NEGATIVE NEGATIVE Final    C Diff interpretation   Final    Results are indeterminate. Please contact the provider listed for your campus for C diff questions in [place].    Comment: Performed at [facility], [place] [address], [place], [place] [address]  C. Diff by PCR, Reflexed     Status: Abnormal   Collection Time: 04/21/21 12:00 PM  Result Value Ref Range Status   Toxigenic C. Difficile by PCR POSITIVE (A) NEGATIVE Final    Comment: Positive for toxigenic C. difficile with little to no toxin production. Only treat if clinical presentation suggests symptomatic illness. Performed at [facility], [place] [address], [place], [place] [address]       Radiology Studies: US RENAL  Result Date: 04/21/2021 CLINICAL DATA:  Acute renal failure EXAM: RENAL / URINARY TRACT ULTRASOUND COMPLETE COMPARISON:  CT abdomen pelvis 01/02/2021 FINDINGS: Right Kidney: Renal measurements: 9.6 x 4.6 x 3.5 cm = volume: 81 mL. Echogenicity within normal limits. No mass or hydronephrosis visualized. Left Kidney: Renal measurements: 9.3 x 4.7 x 3.5 cm = volume: 81 mL. Echogenicity within normal limits. No mass or hydronephrosis visualized. Bladder: Unable to visualize. Other: Exam limited as patient was combative. IMPRESSION: No significant sonographic abnormality of the kidneys. Electronically Signed   By: Miachel Roux M.D.   On: 04/21/2021 12:42   DG CHEST PORT 1 VIEW  Result Date: 04/21/2021 CLINICAL DATA:  Pneumonia EXAM: PORTABLE CHEST 1 VIEW COMPARISON:  04/18/2021, 04/03/2021 FINDINGS: Heart size. Atherosclerotic calcification of the aortic knob. Subtle reticulonodular opacity within the periphery of the right upper lobe. No pleural effusion or pneumothorax. High-riding right humeral head. IMPRESSION: Subtle reticulonodular opacity within the periphery of the right upper lobe suspicious for developing infection. Electronically Signed   By: Davina Poke D.O.   On: 04/21/2021 11:20    Scheduled Meds:  enoxaparin (LOVENOX)  injection  30 mg Subcutaneous Q24H   mometasone-formoterol  2 puff Inhalation BID   pantoprazole  40 mg Oral Daily   QUEtiapine  25 mg Oral QHS   tamsulosin  0.4 mg Oral QHS   vancomycin  125 mg Oral QID   Followed by   Derrill Memo ON 05/06/2021] vancomycin  125 mg Oral BID   Followed by   Derrill Memo ON 05/14/2021] vancomycin  125 mg Oral Daily   Followed by   Derrill Memo ON 05/21/2021] vancomycin  125 mg Oral QODAY   Followed by   Derrill Memo ON 06/18/2021] vancomycin  125 mg Oral Q3 days   Continuous Infusions:  lactated ringers 75 mL/hr at 04/22/21 0057     LOS: 2 days   Marylu Lund, MD [facility] Pager On Amion  If 7PM-7AM, please contact night-coverage 04/22/2021, 5:01 PM

## 2021-04-22 NOTE — Progress Notes (Signed)
Patient has become increasingly agitated and restless. He has climbed out of bed several times. He has been given haldol '2mg'$  and the behavior has actually increased. On called paged via amion for intervention.

## 2021-04-23 LAB — BASIC METABOLIC PANEL
Anion gap: 7 (ref 5–15)
BUN: 37 mg/dL — ABNORMAL HIGH (ref 8–23)
CO2: 25 mmol/L (ref 22–32)
Calcium: 8.6 mg/dL — ABNORMAL LOW (ref 8.9–10.3)
Chloride: 109 mmol/L (ref 98–111)
Creatinine, Ser: 1.13 mg/dL (ref 0.61–1.24)
GFR, Estimated: >60 mL/min (ref >60)
Glucose, Bld: 97 mg/dL (ref 70–99)
Potassium: 2.9 mmol/L — ABNORMAL LOW (ref 3.5–5.1)
Sodium: 141 mmol/L (ref 135–145)

## 2021-04-23 LAB — MAGNESIUM: Magnesium: 2.6 mg/dL — ABNORMAL HIGH (ref 1.7–2.4)

## 2021-04-23 LAB — CBC
HCT: 29.2 % — ABNORMAL LOW (ref 39.0–52.0)
Hemoglobin: 9.2 g/dL — ABNORMAL LOW (ref 13.0–17.0)
MCH: 27.5 pg (ref 26.0–34.0)
MCHC: 31.5 g/dL (ref 30.0–36.0)
MCV: 87.2 fL (ref 80.0–100.0)
Platelets: 254 10*3/uL (ref 150–400)
RBC: 3.35 MIL/uL — ABNORMAL LOW (ref 4.22–5.81)
RDW: 19.4 % — ABNORMAL HIGH (ref 11.5–15.5)
WBC: 9.5 10*3/uL (ref 4.0–10.5)
nRBC: 0 % (ref 0.0–0.2)

## 2021-04-23 MED ORDER — LORAZEPAM 2 MG/ML IJ SOLN
1.0000 mg | INTRAMUSCULAR | Status: AC | PRN
  Administered 2021-04-23 – 2021-04-24 (×3): 1 mg via INTRAVENOUS
  Filled 2021-04-23 (×3): qty 1

## 2021-04-23 MED ORDER — POTASSIUM CHLORIDE 10 MEQ/100ML IV SOLN
10.0000 meq | INTRAVENOUS | Status: AC
Start: 2021-04-23 — End: 2021-04-23
  Administered 2021-04-23 (×6): 10 meq via INTRAVENOUS
  Filled 2021-04-23 (×6): qty 100

## 2021-04-23 MED ORDER — POTASSIUM CHLORIDE CRYS ER 10 MEQ PO TBCR
20.0000 meq | EXTENDED_RELEASE_TABLET | Freq: Two times a day (BID) | ORAL | Status: DC
  Administered 2021-04-23: 20 meq via ORAL
  Filled 2021-04-23: qty 2

## 2021-04-23 MED ORDER — FIDAXOMICIN 200 MG PO TABS
200.0000 mg | ORAL_TABLET | Freq: Two times a day (BID) | ORAL | Status: DC
  Administered 2021-04-23 – 2021-04-30 (×15): 200 mg via ORAL
  Filled 2021-04-23 (×16): qty 1

## 2021-04-23 MED ORDER — ENOXAPARIN SODIUM 40 MG/0.4ML IJ SOSY
40.0000 mg | PREFILLED_SYRINGE | INTRAMUSCULAR | Status: DC
  Administered 2021-04-23 – 2021-04-30 (×8): 40 mg via SUBCUTANEOUS
  Filled 2021-04-23 (×9): qty 0.4

## 2021-04-23 NOTE — Progress Notes (Signed)
Based on low K level yesterday and continued loose stools, AM labs have been added.

## 2021-04-23 NOTE — Progress Notes (Signed)
PROGRESS NOTE    Leon Estes  [id number] DOB: 12/23/1930 DOA: 04/18/2021 PCP: Venia Carbon, MD    Brief Narrative:  87 year old with a history of COPD, CAD, Schwanoma, HTN, HLD, severe visual loss, HOH, recent acute on chronic cholecystitis, and recurrent [name] episodes who was brought to the ER after he developed altered mental status/confusion.  In the ED MRI ruled out the possibility of an acute CVA.  Assessment & Plan:   Principal Problem:   Acute metabolic encephalopathy Active Problems:   Benign essential HTN   Macular degeneration, wet (HCC)   HOH (hard of hearing)   Recurrent Clostridioides difficile diarrhea   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)   Cholecystitis   Toxic metabolic encephalopathy    Acute toxic metabolic encephalopathy MR brain without acute findings -TSH normal -UA unrevealing -ammonia and B12 normal -UA unremarkable -no ultrasound findings to suggest acute cholecystitis -Appreciate input by Neurology . Concerns for possible underlying dementia, recommendation for outpatient neuropsych testing -Cont trial of seroquel, cont to hold gabapentin -Mentation seems to wax and wane.  This afternoon, patient was more confused  Diarrhea secondary to [name], recurrent -Patient continues with multiple watery stools resulting in electrolyte abnormalities -Patient has a hx of c.diff colitis 5/22 -WBC is recently elevated at around 30k, today better -Continue IV fluid hydration as tolerated -C. difficile testing was initially inconclusive, confirmatory test now positive for [name] -Initially continue patient on oral vancomycin for first recurrence -Given past history of severe [name] which resulted in severe sepsis with endorgan failure, did discuss case with infectious disease who recommended Dificid at this time -Stool output appears to be less frequent since starting  C. difficile treatment -Stool culture remains pending  Dehydration -Likely related to diarrheal episodes per above -Mucous membranes still appear dry -We will continue IV fluids as tolerated  ARF -Cr is up to 2.19 today -Likely secondary to diarrhea above -Continue IV fluid hydration as tolerated   HTN -BP stable at present -Cont IVF as tolerated   COPD Seems to be compensated at this time   Schwannoma No evidence of an acute complicating issue   Severe visual loss and hearing loss   Chronic cholecystitis   DVT prophylaxis: Lovenox subq Code Status: DNR Family Communication: Pt in room, family not at bedside  Status is: Inpatient  Remains inpatient appropriate because:Altered mental status and Inpatient level of care appropriate due to severity of illness  Dispo: The patient is from: Home              Anticipated d/c is to: SNF              Patient currently is not medically stable to d/c.   Difficult to place patient No   Consultants:  Neurology  Procedures:    Antimicrobials: Anti-infectives (From admission, onward)    Start     Dose/Rate Route Frequency Ordered Stop   06/18/21 1000  vancomycin (VANCOCIN) capsule 125 mg  Status:  Discontinued       See Hyperspace for full Linked Orders Report.   125 mg Oral Every 3 DAYS 04/22/21 1440 04/22/21 1907   05/21/21 1000  vancomycin (VANCOCIN) capsule 125 mg  Status:  Discontinued       See Hyperspace for full Linked Orders Report.   125 mg Oral Every other day 04/22/21 1440 04/22/21 1907   05/14/21 1000  vancomycin (VANCOCIN) capsule 125 mg  Status:  Discontinued       See Hyperspace for full Linked Orders Report.   125 mg Oral Daily 04/22/21 1440 04/22/21 1907   05/06/21 2200  vancomycin (VANCOCIN) capsule 125 mg  Status:  Discontinued       See Hyperspace for full Linked Orders Report.   125 mg Oral 2 times daily 04/22/21 1440 04/22/21 1907   04/23/21 1200  fidaxomicin (DIFICID) tablet 200 mg        200 mg  Oral 2 times daily 04/23/21 0535 05/03/21 0959   04/22/21 2200  fidaxomicin (DIFICID) tablet 200 mg  Status:  Discontinued        200 mg Oral 2 times daily 04/22/21 1907 04/23/21 0535   04/22/21 1800  vancomycin (VANCOCIN) capsule 125 mg  Status:  Discontinued       See Hyperspace for full Linked Orders Report.   125 mg Oral 4 times daily 04/22/21 1440 04/22/21 1907       Subjective: Discontinued status given current mentation  Objective: Vitals:   04/23/21 0900 04/23/21 1131 04/23/21 1136 04/23/21 1547  BP: (!) 116/52 (!) 104/57 (!) 104/57 122/66  Pulse: 71 95 95 96  Resp: '18 18 18 18  '$ Temp: 98.4 F (36.9 C) 97.9 F (36.6 C) 98.2 F (36.8 C) 98.9 F (37.2 C)  TempSrc: Oral Oral Oral Oral  SpO2: 100%   100%  Weight:        Intake/Output Summary (Last 24 hours) at 04/23/2021 1615 Last data filed at 04/23/2021 1300 Gross per 24 hour  Intake 800 ml  Output --  Net 800 ml    Filed Weights   04/21/21 1300  Weight: 64.9 kg    Examination: General exam: Conversant, in no acute distress Respiratory system: normal chest rise, clear, no audible wheezing Cardiovascular system: regular rhythm, s1-s2 Gastrointestinal system: Nondistended, nontender, pos BS Central nervous system: No seizures, no tremors Extremities: No cyanosis, no joint deformities Skin: No rashes, no pallor Psychiatry: Difficult to assess given current mentation  Data Reviewed: I have personally reviewed following labs and imaging studies  CBC: Recent Labs  Lab 04/18/21 2102 04/18/21 2113 04/19/21 1930 04/21/21 0254 04/22/21 0227 04/23/21 0227  WBC 10.9*  --  4.4 29.0* 8.6 9.5  NEUTROABS 5.6  --   --   --   --   --   HGB 11.0* 12.6* 10.1* 12.2* 9.6* 9.2*  HCT 36.4* 37.0* 28.6* 38.2* 29.6* 29.2*  MCV 89.4  --  96.6 88.0 85.5 87.2  PLT 290  --  288 259 242 0000000    Basic Metabolic Panel: Recent Labs  Lab 04/18/21 2102 04/18/21 2113 04/21/21 0254 04/21/21 0904 04/22/21 0227 04/23/21 0227  04/23/21 0321  NA 137 138 140  --  141 141  --   K 5.0 5.0 3.2*  --  2.5* 2.9*  --   CL 100 102 98  --  102 109  --   CO2 28  --  16*  --  22 25  --   GLUCOSE 110* 105* 104*  --  109* 97  --   BUN '12 14 19  '$ --  46* 37*  --   CREATININE 1.22 1.20 2.01*  --  2.19* 1.13  --   CALCIUM 9.1  --  8.6*  --  8.3* 8.6*  --   MG  --   --   --  1.6* 3.0*  --  2.6*    GFR: Estimated Creatinine Clearance: 41.5 mL/min (by  C-G formula based on SCr of 1.13 mg/dL). Liver Function Tests: Recent Labs  Lab 04/18/21 2102 04/21/21 0254 04/22/21 0227  AST 35 26 29  ALT '17 16 16  '$ ALKPHOS 86 91 63  BILITOT 0.6 1.6* 0.9  PROT 6.7 6.0* 5.2*  ALBUMIN 3.1* 2.6* 2.3*    No results for input(s): LIPASE, AMYLASE in the last 168 hours. Recent Labs  Lab 04/18/21 2250  AMMONIA 10    Coagulation Profile: Recent Labs  Lab 04/18/21 2102  INR 1.0    Cardiac Enzymes: No results for input(s): CKTOTAL, CKMB, CKMBINDEX, TROPONINI in the last 168 hours. BNP (last 3 results) No results for input(s): PROBNP in the last 8760 hours. HbA1C: No results for input(s): HGBA1C in the last 72 hours. CBG: Recent Labs  Lab 04/18/21 2103  GLUCAP 100*    Lipid Profile: No results for input(s): CHOL, HDL, LDLCALC, TRIG, CHOLHDL, LDLDIRECT in the last 72 hours. Thyroid Function Tests: No results for input(s): TSH, T4TOTAL, FREET4, T3FREE, THYROIDAB in the last 72 hours.  Anemia Panel: No results for input(s): VITAMINB12, FOLATE, FERRITIN, TIBC, IRON, RETICCTPCT in the last 72 hours.  Sepsis Labs: Recent Labs  Lab 04/19/21 0043 04/19/21 0101  PROCALCITON  --  <0.10  LATICACIDVEN 1.6  --      Recent Results (from the past 240 hour(s))  Resp Panel by RT-PCR (Flu A&B, Covid)     Status: None   Collection Time: 04/18/21 10:50 PM   Specimen: Nasopharyngeal(NP) swabs in vial transport medium  Result Value Ref Range Status   SARS Coronavirus 2 by RT PCR NEGATIVE NEGATIVE Final    Comment:  (NOTE) SARS-CoV-2 target nucleic acids are NOT DETECTED.  The SARS-CoV-2 RNA is generally detectable in upper respiratory specimens during the acute phase of infection. The lowest concentration of SARS-CoV-2 viral copies this assay can detect is 138 copies/mL. A negative result does not preclude SARS-Cov-2 infection and should not be used as the sole basis for treatment or other patient management decisions. A negative result may occur with  improper specimen collection/handling, submission of specimen other than nasopharyngeal swab, presence of viral mutation(s) within the areas targeted by this assay, and inadequate number of viral copies(<138 copies/mL). A negative result must be combined with clinical observations, patient history, and epidemiological information. The expected result is Negative.  Fact Sheet for Patients:  EntrepreneurPulse.com.au  Fact Sheet for Healthcare Providers:  IncredibleEmployment.be  This test is no t yet approved or cleared by the [place] FDA and  has been authorized for detection and/or diagnosis of SARS-CoV-2 by FDA under an Emergency Use Authorization (EUA). This EUA will remain  in effect (meaning this test can be used) for the duration of the COVID-19 declaration under Section 564(b)(1) of the Act, 21 U.S.C.section 360bbb-3(b)(1), unless the authorization is terminated  or revoked sooner.       Influenza A by PCR NEGATIVE NEGATIVE Final   Influenza B by PCR NEGATIVE NEGATIVE Final    Comment: (NOTE) The Xpert Xpress SARS-CoV-2/FLU/RSV plus assay is intended as an aid in the diagnosis of influenza from Nasopharyngeal swab specimens and should not be used as a sole basis for treatment. Nasal washings and aspirates are unacceptable for Xpert Xpress SARS-CoV-2/FLU/RSV testing.  Fact Sheet for Patients: EntrepreneurPulse.com.au  Fact Sheet for Healthcare  Providers: IncredibleEmployment.be  This test is not yet approved or cleared by the [place] FDA and has been authorized for detection and/or diagnosis of SARS-CoV-2 by FDA under an Emergency Use Authorization (  EUA). This EUA will remain in effect (meaning this test can be used) for the duration of the COVID-19 declaration under Section 564(b)(1) of the Act, 21 U.S.C. section 360bbb-3(b)(1), unless the authorization is terminated or revoked.  Performed at [facility], [place] [address], [place], [place] [address]   Culture, blood (routine x 2)     Status: None (Preliminary result)   Collection Time: 04/21/21  9:04 AM   Specimen: Right Antecubital; Blood  Result Value Ref Range Status   Specimen Description RIGHT ANTECUBITAL  Final   Special Requests   Final    BOTTLES DRAWN AEROBIC AND ANAEROBIC Blood Culture adequate volume   Culture   Final    NO GROWTH 1 DAY Performed at [facility], [place] [address] [place], [place] [address]    Report Status PENDING  Incomplete  Culture, blood (routine x 2)     Status: Abnormal (Preliminary result)   Collection Time: 04/21/21  9:14 AM   Specimen: BLOOD RIGHT ARM  Result Value Ref Range Status   Specimen Description BLOOD RIGHT ARM  Final   Special Requests   Final    BOTTLES DRAWN AEROBIC AND ANAEROBIC Blood Culture adequate volume   Culture  Setup Time   Final    GRAM POSITIVE COCCI IN BOTH AEROBIC AND ANAEROBIC BOTTLES CRITICAL RESULT CALLED TO, READ BACK BY AND VERIFIED WITH: PHARMD GREG ABBOTT 04/22/21'@3'$ :36 BY TW    Culture (A)  Final    STAPHYLOCOCCUS EPIDERMIDIS STAPHYLOCOCCUS HAEMOLYTICUS THE SIGNIFICANCE OF ISOLATING THIS ORGANISM FROM A SINGLE SET OF BLOOD CULTURES WHEN MULTIPLE SETS ARE DRAWN IS UNCERTAIN. PLEASE NOTIFY THE MICROBIOLOGY DEPARTMENT WITHIN ONE WEEK IF SPECIATION AND SENSITIVITIES ARE REQUIRED. Performed at [facility], [place] [address] [place], [place] [address]    Report  Status PENDING  Incomplete  Blood Culture ID Panel (Reflexed)     Status: Abnormal   Collection Time: 04/21/21  9:14 AM  Result Value Ref Range Status   Enterococcus faecalis NOT DETECTED NOT DETECTED Final   Enterococcus Faecium NOT DETECTED NOT DETECTED Final   Listeria monocytogenes NOT DETECTED NOT DETECTED Final   Staphylococcus species DETECTED (A) NOT DETECTED Final    Comment: CRITICAL RESULT CALLED TO, READ BACK BY AND VERIFIED WITH: PHARMD GREG ABBOTT 04/22/21'@3'$ :36 BY TW    Staphylococcus aureus (BCID) NOT DETECTED NOT DETECTED Final   Staphylococcus epidermidis DETECTED (A) NOT DETECTED Final    Comment: Methicillin (oxacillin) resistant coagulase negative staphylococcus. Possible blood culture contaminant (unless isolated from more than one blood culture draw or clinical case suggests pathogenicity). No antibiotic treatment is indicated for blood  culture contaminants. CRITICAL RESULT CALLED TO, READ BACK BY AND VERIFIED WITH: PHARMD GREG ABBOTT 04/22/21'@3'$ :36 BY TW    Staphylococcus lugdunensis NOT DETECTED NOT DETECTED Final   Streptococcus species NOT DETECTED NOT DETECTED Final   Streptococcus agalactiae NOT DETECTED NOT DETECTED Final   Streptococcus pneumoniae NOT DETECTED NOT DETECTED Final   Streptococcus pyogenes NOT DETECTED NOT DETECTED Final   A.calcoaceticus-baumannii NOT DETECTED NOT DETECTED Final   Bacteroides fragilis NOT DETECTED NOT DETECTED Final   Enterobacterales NOT DETECTED NOT DETECTED Final   Enterobacter cloacae complex NOT DETECTED NOT DETECTED Final   Escherichia coli NOT DETECTED NOT DETECTED Final   Klebsiella aerogenes NOT DETECTED NOT DETECTED Final   Klebsiella oxytoca NOT DETECTED NOT DETECTED Final   Klebsiella pneumoniae NOT DETECTED NOT DETECTED Final   Proteus species NOT DETECTED NOT DETECTED Final  Salmonella species NOT DETECTED NOT DETECTED Final   Serratia marcescens NOT DETECTED NOT DETECTED Final   Haemophilus influenzae NOT  DETECTED NOT DETECTED Final   Neisseria meningitidis NOT DETECTED NOT DETECTED Final   Pseudomonas aeruginosa NOT DETECTED NOT DETECTED Final   Stenotrophomonas maltophilia NOT DETECTED NOT DETECTED Final   Candida albicans NOT DETECTED NOT DETECTED Final   Candida auris NOT DETECTED NOT DETECTED Final   Candida glabrata NOT DETECTED NOT DETECTED Final   Candida krusei NOT DETECTED NOT DETECTED Final   Candida parapsilosis NOT DETECTED NOT DETECTED Final   Candida tropicalis NOT DETECTED NOT DETECTED Final   Cryptococcus neoformans/gattii NOT DETECTED NOT DETECTED Final   Methicillin resistance mecA/C DETECTED (A) NOT DETECTED Final    Comment: CRITICAL RESULT CALLED TO, READ BACK BY AND VERIFIED WITH: PHARMD GREG ABBOTT 04/22/21'@3'$ :36 BY TW Performed at [facility], [address] [place], [place] [address]   C Difficile Quick Screen (NO PCR Reflex)     Status: Abnormal   Collection Time: 04/21/21 12:00 PM   Specimen: Urine, Clean Catch; Stool  Result Value Ref Range Status   C Diff antigen POSITIVE (A) NEGATIVE Final   C Diff toxin NEGATIVE NEGATIVE Final   C Diff interpretation   Final    Results are indeterminate. Please contact the provider listed for your campus for C diff questions in [place].    Comment: Performed at [facility], [place] [address] [place], [place] [address]  C. Diff by PCR, Reflexed     Status: Abnormal   Collection Time: 04/21/21 12:00 PM  Result Value Ref Range Status   Toxigenic C. Difficile by PCR POSITIVE (A) NEGATIVE Final    Comment: Positive for toxigenic C. difficile with little to no toxin production. Only treat if clinical presentation suggests symptomatic illness. Performed at [facility], [place] [address] [place], [place] [address]       Radiology Studies: No results found.  Scheduled Meds:  enoxaparin (LOVENOX) injection  40 mg Subcutaneous Q24H   fidaxomicin  200 mg Oral BID   mometasone-formoterol  2 puff Inhalation BID    potassium chloride  20 mEq Oral BID   QUEtiapine  25 mg Oral QHS   tamsulosin  0.4 mg Oral QHS   Continuous Infusions:  lactated ringers 75 mL/hr at 04/22/21 0057     LOS: 3 days   Marylu Lund, MD [facility] Pager On Amion  If 7PM-7AM, please contact night-coverage 04/23/2021, 4:15 PM

## 2021-04-23 NOTE — TOC Progression Note (Addendum)
Transition of Care [facility]) - Progression Note    Patient Details  Name: REAGAN SASS MRN: [id number] Date of Birth: [birth year]  Transition of Care [facility]) CM/SW [place], [place] Phone Number: 04/23/2021, 11:16 AM  Clinical Narrative:    CSW called both of pt's wife's phone numbers and was able to leave a VM on one. CSW called oldest daughter Darin Engels noted she would get her mother to call back. SNF recommendation was briefly discussed with dtr, and she noted she thought her dad needed it, but wasn't sure if her mom would agree. HH was also discussed as an option. CSW advised that family look into [employer]. Dtr stated she would have wife call CSW back. CSW will complete workup and faxout after speaking with spouse.   11:30 am Spouse returned phone call and SNF recommendation was explained. She noted that she wasn't sure yet if she wanted pt to go to a SNF. HH and limitations were discussed as well. Spouse gave permission for a bed search while she discussed choice with her family. Medicare.gov was explained, and spouse asked if pt could be faxed out near [place], so she could easily visit. Pt has had initial covid shots, but no boosters. Plan is for pt to return home after rehab. CSW will complete workup and faxout in case family decides on SNF placement. TOC will continue to follow for DC planning.  Expected Discharge Plan: Home/Self Care Barriers to Discharge: Continued Medical Work up  Expected Discharge Plan and Services Expected Discharge Plan: Home/Self Care   Discharge Planning Services: CM Consult   Living arrangements for the past 2 months: Single Family Home                                       Social Determinants of Health (SDOH) Interventions    Readmission Risk Interventions No flowsheet data found.

## 2021-04-23 NOTE — NC FL2 (Signed)
[place] LEVEL OF CARE SCREENING TOOL     IDENTIFICATION  Patient Name: Leon Estes Birthdate: 1930/09/10 Sex: male Admission Date (Current Location): 04/18/2021  [facility] and [place] Number:  Herbalist and Address:  [facility], [place] [address], [place], [place] [address]      Provider Number: [id number]  Attending Physician Name and Address:  Donne Hazel, MD  Relative Name and Phone Number:  Mahmoud Mohar, [phone]    Current Level of Care: Hospital Recommended Level of Care: [place] Prior Approval Number:    Date Approved/Denied: 04/23/21 PASRR Number: [id number]  Discharge Plan: SNF    Current Diagnoses: Patient Active Problem List   Diagnosis Date Noted   Acute metabolic encephalopathy A999333   Cholecystitis A999333   Toxic metabolic encephalopathy A999333   Skin tear of right upper arm without complication 0000000   Neoplasm of brain causing mass effect on adjacent structures ([place]) 04/07/2021   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   Delirium 02/08/2021   Goals of care, counseling/discussion    Colitis due to Clostridioides difficile 12/31/2020   HOH (hard of hearing) 12/28/2020   Acute on chronic cholecystitis 12/28/2020   Arrhythmia 12/06/2020   Pulmonary nodule 12/06/2020   Acute cholecystitis 11/26/2020   Preventative health care 10/12/2020   Macular degeneration, wet ([place]) 10/12/2020   Acquired claw toe, left 02/02/2020   BPH (benign prostatic hyperplasia) 01/13/2020   Neuropathy 02/18/2018   COPD (chronic obstructive pulmonary disease) ([place])    Chronic renal disease, stage III ([place])    Benign essential HTN 04/09/2017   CAD (coronary artery disease) 03/03/2015   Hyperlipidemia 03/03/2015    Orientation RESPIRATION BLADDER Height & Weight        Normal Incontinent Weight: 143 lb 1.3 oz (64.9 kg) Height:     BEHAVIORAL SYMPTOMS/MOOD  NEUROLOGICAL BOWEL NUTRITION STATUS      Incontinent Diet (fluid thin)  AMBULATORY STATUS COMMUNICATION OF NEEDS Skin   Extensive Assist Verbally                         Personal Care Assistance Level of Assistance  Bathing, Feeding, Dressing Bathing Assistance: Maximum assistance Feeding assistance: Limited assistance Dressing Assistance: Limited assistance     Functional Limitations Info  Sight Sight Info: Impaired        SPECIAL CARE FACTORS FREQUENCY  PT (By licensed PT), OT (By licensed OT)     PT Frequency: 5x weekly OT Frequency: 5x weekly            Contractures Contractures Info: Not present    Additional Factors Info  Allergies, Code Status Code Status Info: DNR Allergies Info: sulfa, augmentin, benzalkonium chloride, terbinafide, neosporin,           Current Medications (04/23/2021):  This is the current hospital active medication list Current Facility-Administered Medications  Medication Dose Route Frequency Provider Last Rate Last Admin   acetaminophen (TYLENOL) tablet 650 mg  650 mg Oral Q6H PRN Etta Quill, DO       enoxaparin (LOVENOX) injection 40 mg  40 mg Subcutaneous Q24H Karren Cobble, RPH   40 mg at 04/23/21 1020   fidaxomicin (DIFICID) tablet 200 mg  200 mg Oral BID Donne Hazel, MD       haloperidol lactate (HALDOL) injection 1-2 mg  1-2 mg Intravenous Q6H PRN Cherene Altes, MD   2 mg at 04/22/21 2247  lactated ringers infusion   Intravenous Continuous Etta Quill, DO 75 mL/hr at 04/22/21 0057 New Bag at 04/22/21 0057   LORazepam (ATIVAN) injection 1 mg  1 mg Intravenous Q4H PRN Chotiner, Yevonne Aline, MD   1 mg at 04/23/21 1015   mometasone-formoterol (DULERA) 200-5 MCG/ACT inhaler 2 puff  2 puff Inhalation BID Cherene Altes, MD       ondansetron [facility]) tablet 4 mg  4 mg Oral Q6H PRN Etta Quill, DO       Or   ondansetron [facility]) injection 4 mg  4 mg Intravenous Q6H PRN Etta Quill, DO        potassium chloride (KLOR-CON) CR tablet 20 mEq  20 mEq Oral BID Donne Hazel, MD       potassium chloride 10 mEq in 100 mL IVPB  10 mEq Intravenous Q1 Hr x 6 Karren Cobble, RPH 100 mL/hr at 04/23/21 1027 10 mEq at 04/23/21 1027   QUEtiapine (SEROQUEL) tablet 25 mg  25 mg Oral QHS Cherene Altes, MD   25 mg at 04/22/21 2144   tamsulosin (FLOMAX) capsule 0.4 mg  0.4 mg Oral QHS Cherene Altes, MD   0.4 mg at 04/22/21 2144     Discharge Medications: Please see discharge summary for a list of discharge medications.  Relevant Imaging Results:  Relevant Lab Results:   Additional Information SSN: [id number], Levan Hurst 12/31/2019, 12/03/2019  Oretha Milch, LCSW

## 2021-04-23 NOTE — Plan of Care (Signed)

## 2021-04-24 ENCOUNTER — Other Ambulatory Visit (HOSPITAL_COMMUNITY): Payer: Self-pay

## 2021-04-24 LAB — COMPREHENSIVE METABOLIC PANEL
ALT: 24 U/L (ref 0–44)
AST: 36 U/L (ref 15–41)
Albumin: 2.5 g/dL — ABNORMAL LOW (ref 3.5–5.0)
Alkaline Phosphatase: 71 U/L (ref 38–126)
Anion gap: 10 (ref 5–15)
BUN: 21 mg/dL (ref 8–23)
CO2: 22 mmol/L (ref 22–32)
Calcium: 8.6 mg/dL — ABNORMAL LOW (ref 8.9–10.3)
Chloride: 107 mmol/L (ref 98–111)
Creatinine, Ser: 0.91 mg/dL (ref 0.61–1.24)
GFR, Estimated: >60 mL/min (ref >60)
Glucose, Bld: 86 mg/dL (ref 70–99)
Potassium: 3.3 mmol/L — ABNORMAL LOW (ref 3.5–5.1)
Sodium: 139 mmol/L (ref 135–145)
Total Bilirubin: 0.8 mg/dL (ref 0.3–1.2)
Total Protein: 5.6 g/dL — ABNORMAL LOW (ref 6.5–8.1)

## 2021-04-24 LAB — CBC
HCT: 31.5 % — ABNORMAL LOW (ref 39.0–52.0)
Hemoglobin: 10.2 g/dL — ABNORMAL LOW (ref 13.0–17.0)
MCH: 27.9 pg (ref 26.0–34.0)
MCHC: 32.4 g/dL (ref 30.0–36.0)
MCV: 86.3 fL (ref 80.0–100.0)
Platelets: 181 10*3/uL (ref 150–400)
RBC: 3.65 MIL/uL — ABNORMAL LOW (ref 4.22–5.81)
RDW: 19 % — ABNORMAL HIGH (ref 11.5–15.5)
WBC: 9.2 10*3/uL (ref 4.0–10.5)
nRBC: 0.2 % (ref 0.0–0.2)

## 2021-04-24 LAB — CULTURE, BLOOD (ROUTINE X 2): Special Requests: ADEQUATE

## 2021-04-24 MED ORDER — POTASSIUM CHLORIDE CRYS ER 20 MEQ PO TBCR
60.0000 meq | EXTENDED_RELEASE_TABLET | Freq: Once | ORAL | Status: AC
  Administered 2021-04-24: 60 meq via ORAL
  Filled 2021-04-24: qty 3

## 2021-04-24 MED ORDER — POTASSIUM CHLORIDE CRYS ER 10 MEQ PO TBCR
20.0000 meq | EXTENDED_RELEASE_TABLET | Freq: Two times a day (BID) | ORAL | Status: DC
  Administered 2021-04-25: 20 meq via ORAL
  Filled 2021-04-24: qty 2

## 2021-04-24 NOTE — Progress Notes (Signed)
PROGRESS NOTE    Leon Estes  [id number] DOB: 1930-12-06 DOA: 04/18/2021 PCP: Venia Carbon, MD    Brief Narrative:  87 year old with a history of COPD, CAD, Schwanoma, HTN, HLD, severe visual loss, HOH, recent acute on chronic cholecystitis, and recurrent [name] episodes who was brought to the ER after he developed altered mental status/confusion.  In the ED MRI ruled out the possibility of an acute CVA.  Assessment & Plan:   Principal Problem:   Acute metabolic encephalopathy Active Problems:   Benign essential HTN   Macular degeneration, wet (HCC)   HOH (hard of hearing)   Recurrent Clostridioides difficile diarrhea   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)   Cholecystitis   Toxic metabolic encephalopathy    Acute toxic metabolic encephalopathy MR brain without acute findings -TSH normal -UA unrevealing -ammonia and B12 normal -UA unremarkable -no ultrasound findings to suggest acute cholecystitis -Appreciate input by Neurology . Concerns for possible underlying dementia, recommendation for outpatient neuropsych testing -Cont trial of seroquel, cont to hold gabapentin -Mentation continues to wax and wane. Seems confused this AM  Diarrhea secondary to [name], recurrent -Patient continues with multiple watery stools resulting in electrolyte abnormalities -Patient has a hx of c.diff colitis 5/22 -WBC is recently elevated at around 30k, today better -Continue IV fluid hydration as tolerated -C. difficile testing was initially inconclusive, confirmatory test now positive for [name] -Initially continue patient on oral vancomycin for first recurrence -Given past history of severe [name] which resulted in severe sepsis with endorgan failure, did discuss case with infectious disease who recommended Dificid at this time -Stool output has improved since starting Dificid -Stool culture remains  pending  Dehydration -Likely related to diarrheal episodes per above -Mucus membranes dry, but is improving -Continued on IVF hydration  ARF -Cr has normalized with IVF -Continued on IVF but will decrease rate   HTN -BP stable at present -Cont IVF as tolerated   COPD Seems to be compensated at this time   Schwannoma No evidence of an acute complicating issue   Severe visual loss and hearing loss   Chronic cholecystitis   DVT prophylaxis: Lovenox subq Code Status: DNR Family Communication: Pt in room, family currently at bedside  Status is: Inpatient  Remains inpatient appropriate because:Altered mental status and Inpatient level of care appropriate due to severity of illness  Dispo: The patient is from: Home              Anticipated d/c is to: SNF              Patient currently is not medically stable to d/c.   Difficult to place patient No   Consultants:  Neurology  Procedures:    Antimicrobials: Anti-infectives (From admission, onward)    Start     Dose/Rate Route Frequency Ordered Stop   06/18/21 1000  vancomycin (VANCOCIN) capsule 125 mg  Status:  Discontinued       See Hyperspace for full Linked Orders Report.   125 mg Oral Every 3 DAYS 04/22/21 1440 04/22/21 1907   05/21/21 1000  vancomycin (VANCOCIN) capsule 125 mg  Status:  Discontinued       See Hyperspace for full Linked Orders Report.   125 mg Oral Every other day 04/22/21 1440 04/22/21 1907   05/14/21 1000  vancomycin (VANCOCIN) capsule 125 mg  Status:  Discontinued       See Hyperspace for full Linked Orders  Report.   125 mg Oral Daily 04/22/21 1440 04/22/21 1907   05/06/21 2200  vancomycin (VANCOCIN) capsule 125 mg  Status:  Discontinued       See Hyperspace for full Linked Orders Report.   125 mg Oral 2 times daily 04/22/21 1440 04/22/21 1907   04/23/21 1200  fidaxomicin (DIFICID) tablet 200 mg        200 mg Oral 2 times daily 04/23/21 0535 05/03/21 0959   04/22/21 2200  fidaxomicin  (DIFICID) tablet 200 mg  Status:  Discontinued        200 mg Oral 2 times daily 04/22/21 1907 04/23/21 0535   04/22/21 1800  vancomycin (VANCOCIN) capsule 125 mg  Status:  Discontinued       See Hyperspace for full Linked Orders Report.   125 mg Oral 4 times daily 04/22/21 1440 04/22/21 1907       Subjective: Confused this AM  Objective: Vitals:   04/23/21 2338 04/24/21 0355 04/24/21 0720 04/24/21 1140  BP: (!) 130/108 (!) 158/89 130/69 (!) 147/72  Pulse: (!) 103 (!) 53 91 (!) 107  Resp: '18 18 20 20  '$ Temp: 98.1 F (36.7 C) (!) 97.5 F (36.4 C) 98.5 F (36.9 C) 98.4 F (36.9 C)  TempSrc: Oral Oral Oral Oral  SpO2: 99% 94% 97% 97%  Weight:        Intake/Output Summary (Last 24 hours) at 04/24/2021 1546 Last data filed at 04/24/2021 0913 Gross per 24 hour  Intake 2647.88 ml  Output --  Net 2647.88 ml    Filed Weights   04/21/21 1300  Weight: 64.9 kg    Examination: General exam: Awake, laying in bed, in nad Respiratory system: Normal respiratory effort, no wheezing Cardiovascular system: regular rate, s1, s2 Gastrointestinal system: Soft, nondistended, positive BS Central nervous system: CN2-12 grossly intact, strength intact Extremities: Perfused, no clubbing Skin: Normal skin turgor, no notable skin lesions seen Psychiatry: Difficult to assess given mentation  Data Reviewed: I have personally reviewed following labs and imaging studies  CBC: Recent Labs  Lab 04/18/21 2102 04/18/21 2113 04/19/21 1930 04/21/21 0254 04/22/21 0227 04/23/21 0227 04/24/21 0341  WBC 10.9*  --  4.4 29.0* 8.6 9.5 9.2  NEUTROABS 5.6  --   --   --   --   --   --   HGB 11.0*   < > 10.1* 12.2* 9.6* 9.2* 10.2*  HCT 36.4*   < > 28.6* 38.2* 29.6* 29.2* 31.5*  MCV 89.4  --  96.6 88.0 85.5 87.2 86.3  PLT 290  --  288 259 242 254 181   < > = values in this interval not displayed.    Basic Metabolic Panel: Recent Labs  Lab 04/18/21 2102 04/18/21 2113 04/21/21 0254 04/21/21 0904  04/22/21 0227 04/23/21 0227 04/23/21 0321 04/24/21 0341  NA 137 138 140  --  141 141  --  139  K 5.0 5.0 3.2*  --  2.5* 2.9*  --  3.3*  CL 100 102 98  --  102 109  --  107  CO2 28  --  16*  --  22 25  --  22  GLUCOSE 110* 105* 104*  --  109* 97  --  86  BUN '12 14 19  '$ --  46* 37*  --  21  CREATININE 1.22 1.20 2.01*  --  2.19* 1.13  --  0.91  CALCIUM 9.1  --  8.6*  --  8.3* 8.6*  --  8.6*  MG  --   --   --  1.6* 3.0*  --  2.6*  --     GFR: Estimated Creatinine Clearance: 51.5 mL/min (by C-G formula based on SCr of 0.91 mg/dL). Liver Function Tests: Recent Labs  Lab 04/18/21 2102 04/21/21 0254 04/22/21 0227 04/24/21 0341  AST 35 26 29 36  ALT '17 16 16 24  '$ ALKPHOS 86 91 63 71  BILITOT 0.6 1.6* 0.9 0.8  PROT 6.7 6.0* 5.2* 5.6*  ALBUMIN 3.1* 2.6* 2.3* 2.5*    No results for input(s): LIPASE, AMYLASE in the last 168 hours. Recent Labs  Lab 04/18/21 2250  AMMONIA 10    Coagulation Profile: Recent Labs  Lab 04/18/21 2102  INR 1.0    Cardiac Enzymes: No results for input(s): CKTOTAL, CKMB, CKMBINDEX, TROPONINI in the last 168 hours. BNP (last 3 results) No results for input(s): PROBNP in the last 8760 hours. HbA1C: No results for input(s): HGBA1C in the last 72 hours. CBG: Recent Labs  Lab 04/18/21 2103  GLUCAP 100*    Lipid Profile: No results for input(s): CHOL, HDL, LDLCALC, TRIG, CHOLHDL, LDLDIRECT in the last 72 hours. Thyroid Function Tests: No results for input(s): TSH, T4TOTAL, FREET4, T3FREE, THYROIDAB in the last 72 hours.  Anemia Panel: No results for input(s): VITAMINB12, FOLATE, FERRITIN, TIBC, IRON, RETICCTPCT in the last 72 hours.  Sepsis Labs: Recent Labs  Lab 04/19/21 0043 04/19/21 0101  PROCALCITON  --  <0.10  LATICACIDVEN 1.6  --      Recent Results (from the past 240 hour(s))  Resp Panel by RT-PCR (Flu A&B, Covid)     Status: None   Collection Time: 04/18/21 10:50 PM   Specimen: Nasopharyngeal(NP) swabs in vial transport  medium  Result Value Ref Range Status   SARS Coronavirus 2 by RT PCR NEGATIVE NEGATIVE Final    Comment: (NOTE) SARS-CoV-2 target nucleic acids are NOT DETECTED.  The SARS-CoV-2 RNA is generally detectable in upper respiratory specimens during the acute phase of infection. The lowest concentration of SARS-CoV-2 viral copies this assay can detect is 138 copies/mL. A negative result does not preclude SARS-Cov-2 infection and should not be used as the sole basis for treatment or other patient management decisions. A negative result may occur with  improper specimen collection/handling, submission of specimen other than nasopharyngeal swab, presence of viral mutation(s) within the areas targeted by this assay, and inadequate number of viral copies(<138 copies/mL). A negative result must be combined with clinical observations, patient history, and epidemiological information. The expected result is Negative.  Fact Sheet for Patients:  EntrepreneurPulse.com.au  Fact Sheet for Healthcare Providers:  IncredibleEmployment.be  This test is no t yet approved or cleared by the [place] FDA and  has been authorized for detection and/or diagnosis of SARS-CoV-2 by FDA under an Emergency Use Authorization (EUA). This EUA will remain  in effect (meaning this test can be used) for the duration of the COVID-19 declaration under Section 564(b)(1) of the Act, 21 U.S.C.section 360bbb-3(b)(1), unless the authorization is terminated  or revoked sooner.       Influenza A by PCR NEGATIVE NEGATIVE Final   Influenza B by PCR NEGATIVE NEGATIVE Final    Comment: (NOTE) The Xpert Xpress SARS-CoV-2/FLU/RSV plus assay is intended as an aid in the diagnosis of influenza from Nasopharyngeal swab specimens and should not be used as a sole basis for treatment. Nasal washings and aspirates are unacceptable for Xpert Xpress SARS-CoV-2/FLU/RSV testing.  Fact Sheet for  Patients: EntrepreneurPulse.com.au  Fact Sheet for Healthcare Providers: IncredibleEmployment.be  This test is not yet approved or cleared by the [place] FDA and has been authorized for detection and/or diagnosis of SARS-CoV-2 by FDA under an Emergency Use Authorization (EUA). This EUA will remain in effect (meaning this test can be used) for the duration of the COVID-19 declaration under Section 564(b)(1) of the Act, 21 U.S.C. section 360bbb-3(b)(1), unless the authorization is terminated or revoked.  Performed at [facility], [place] [address], [place], [place] [address]   Culture, blood (routine x 2)     Status: None (Preliminary result)   Collection Time: 04/21/21  9:04 AM   Specimen: Right Antecubital; Blood  Result Value Ref Range Status   Specimen Description RIGHT ANTECUBITAL  Final   Special Requests   Final    BOTTLES DRAWN AEROBIC AND ANAEROBIC Blood Culture adequate volume   Culture   Final    NO GROWTH 3 DAYS Performed at [facility], [place] [address], [place], [place] [address]    Report Status PENDING  Incomplete  Culture, blood (routine x 2)     Status: Abnormal   Collection Time: 04/21/21  9:14 AM   Specimen: BLOOD RIGHT ARM  Result Value Ref Range Status   Specimen Description BLOOD RIGHT ARM  Final   Special Requests   Final    BOTTLES DRAWN AEROBIC AND ANAEROBIC Blood Culture adequate volume   Culture  Setup Time   Final    GRAM POSITIVE COCCI IN BOTH AEROBIC AND ANAEROBIC BOTTLES CRITICAL RESULT CALLED TO, READ BACK BY AND VERIFIED WITH: PHARMD GREG ABBOTT 04/22/21'@3'$ :36 BY TW    Culture (A)  Final    STAPHYLOCOCCUS EPIDERMIDIS STAPHYLOCOCCUS HAEMOLYTICUS THE SIGNIFICANCE OF ISOLATING THIS ORGANISM FROM A SINGLE SET OF BLOOD CULTURES WHEN MULTIPLE SETS ARE DRAWN IS UNCERTAIN. PLEASE NOTIFY THE MICROBIOLOGY DEPARTMENT WITHIN ONE WEEK IF SPECIATION AND SENSITIVITIES ARE REQUIRED. Performed at [facility], [place] [address], [place], [place] [address]    Report Status 04/24/2021 FINAL  Final  Blood Culture ID Panel (Reflexed)     Status: Abnormal   Collection Time: 04/21/21  9:14 AM  Result Value Ref Range Status   Enterococcus faecalis NOT DETECTED NOT DETECTED Final   Enterococcus Faecium NOT DETECTED NOT DETECTED Final   Listeria monocytogenes NOT DETECTED NOT DETECTED Final   Staphylococcus species DETECTED (A) NOT DETECTED Final    Comment: CRITICAL RESULT CALLED TO, READ BACK BY AND VERIFIED WITH: PHARMD GREG ABBOTT 04/22/21'@3'$ :36 BY TW    Staphylococcus aureus (BCID) NOT DETECTED NOT DETECTED Final   Staphylococcus epidermidis DETECTED (A) NOT DETECTED Final    Comment: Methicillin (oxacillin) resistant coagulase negative staphylococcus. Possible blood culture contaminant (unless isolated from more than one blood culture draw or clinical case suggests pathogenicity). No antibiotic treatment is indicated for blood  culture contaminants. CRITICAL RESULT CALLED TO, READ BACK BY AND VERIFIED WITH: PHARMD GREG ABBOTT 04/22/21'@3'$ :36 BY TW    Staphylococcus lugdunensis NOT DETECTED NOT DETECTED Final   Streptococcus species NOT DETECTED NOT DETECTED Final   Streptococcus agalactiae NOT DETECTED NOT DETECTED Final   Streptococcus pneumoniae NOT DETECTED NOT DETECTED Final   Streptococcus pyogenes NOT DETECTED NOT DETECTED Final   A.calcoaceticus-baumannii NOT DETECTED NOT DETECTED Final   Bacteroides fragilis NOT DETECTED NOT DETECTED Final   Enterobacterales NOT DETECTED NOT DETECTED Final   Enterobacter cloacae complex NOT DETECTED NOT DETECTED Final   Escherichia coli NOT DETECTED NOT DETECTED Final  Klebsiella aerogenes NOT DETECTED NOT DETECTED Final   Klebsiella oxytoca NOT DETECTED NOT DETECTED Final   Klebsiella pneumoniae NOT DETECTED NOT DETECTED Final   Proteus species NOT DETECTED NOT DETECTED Final   Salmonella species NOT DETECTED NOT DETECTED Final   Serratia  marcescens NOT DETECTED NOT DETECTED Final   Haemophilus influenzae NOT DETECTED NOT DETECTED Final   Neisseria meningitidis NOT DETECTED NOT DETECTED Final   Pseudomonas aeruginosa NOT DETECTED NOT DETECTED Final   Stenotrophomonas maltophilia NOT DETECTED NOT DETECTED Final   Candida albicans NOT DETECTED NOT DETECTED Final   Candida auris NOT DETECTED NOT DETECTED Final   Candida glabrata NOT DETECTED NOT DETECTED Final   Candida krusei NOT DETECTED NOT DETECTED Final   Candida parapsilosis NOT DETECTED NOT DETECTED Final   Candida tropicalis NOT DETECTED NOT DETECTED Final   Cryptococcus neoformans/gattii NOT DETECTED NOT DETECTED Final   Methicillin resistance mecA/C DETECTED (A) NOT DETECTED Final    Comment: CRITICAL RESULT CALLED TO, READ BACK BY AND VERIFIED WITH: PHARMD GREG ABBOTT 04/22/21'@3'$ :36 BY TW Performed at [facility], [address] [place] [address] [place], [place] [address]   C Difficile Quick Screen (NO PCR Reflex)     Status: Abnormal   Collection Time: 04/21/21 12:00 PM   Specimen: Urine, Clean Catch; Stool  Result Value Ref Range Status   C Diff antigen POSITIVE (A) NEGATIVE Final   C Diff toxin NEGATIVE NEGATIVE Final   C Diff interpretation   Final    Results are indeterminate. Please contact the provider listed for your campus for C diff questions in [place].    Comment: Performed at [facility], [place] [address], [place], [place] [address]  C. Diff by PCR, Reflexed     Status: Abnormal   Collection Time: 04/21/21 12:00 PM  Result Value Ref Range Status   Toxigenic C. Difficile by PCR POSITIVE (A) NEGATIVE Final    Comment: Positive for toxigenic C. difficile with little to no toxin production. Only treat if clinical presentation suggests symptomatic illness. Performed at [facility], [place] [address] [place],  [address]       Radiology Studies: No results found.  Scheduled Meds:  enoxaparin (LOVENOX) injection  40 mg Subcutaneous Q24H    fidaxomicin  200 mg Oral BID   mometasone-formoterol  2 puff Inhalation BID   [START ON 04/25/2021] potassium chloride  20 mEq Oral BID   QUEtiapine  25 mg Oral QHS   tamsulosin  0.4 mg Oral QHS   Continuous Infusions:  lactated ringers 100 mL/hr at 04/24/21 0853     LOS: 4 days   Marylu Lund, MD [facility] Pager On Amion  If 7PM-7AM, please contact night-coverage 04/24/2021, 3:46 PM

## 2021-04-24 NOTE — TOC Benefit Eligibility Note (Signed)
Patient Advocate Encounter  Patient is approved through the Clostridium Difficile Associated Diarrhea Copay Assistance for Dificid through [employer].      Lyndel Safe, [place] Patient Advocate Specialist [place] [facility] Stewardship Team Direct Number: [phone]  Fax: [phone]

## 2021-04-24 NOTE — Progress Notes (Signed)
Physical Therapy Treatment Patient Details Name: Leon Estes MRN: [id number] DOB: 1931/02/14 Today's Date: 04/24/2021   History of Present Illness Pt is an 87 y.o. male admitted on 04/18/21 for L sided weakness, confusion, aphasia.  Pt dx with encepholopathy of unknown origin, medical workup continues.  MRI stable (shows Schwannoma, but stable compared to last scan).  Pt with signiciant PMH of cataract, COPD, CAD, hearing loss, HTN, macular degeneration.    PT Comments    Pt progressing towards physical therapy goals. Was able to perform transfers with up to +2 mod assist, however feel cognition was limiting session more than physical ability. Sitter present during PT session and encouraged OOB to chair. Pt appeared calmer after transition to the chair. Will continue to follow and progress as able per POC.     Recommendations for follow up therapy are one component of a multi-disciplinary discharge planning process, led by the attending physician.  Recommendations may be updated based on patient status, additional functional criteria and insurance authorization.  Follow Up Recommendations  SNF     Equipment Recommendations  Hospital bed;Wheelchair cushion (measurements PT);Wheelchair (measurements PT)    Recommendations for Other Services       Precautions / Restrictions Precautions Precautions: Fall Precaution Comments: has been known to kick and hit (per RN) this admission Restrictions Weight Bearing Restrictions: No     Mobility  Bed Mobility Overal bed mobility: Needs Assistance Bed Mobility: Rolling;Supine to Sit Rolling: Min assist   Supine to sit: Mod assist     General bed mobility comments: Multiple rolls for pt to get clean linens as he had pulled condom cath off prior to PT arriving. Multimodal cues to faciliate transition to sitting EOB.    Transfers Overall transfer level: Needs assistance Equipment used: 2 person hand held assist Transfers: Sit to/from  [facility] Sit to Stand: Min assist;+2 physical assistance Stand pivot transfers: Mod assist;+2 safety/equipment       General transfer comment: Pt with difficulty advancing LE's to take pivotal steps around from bed to chair. PT shifted from lateral support to anterior support and utilized gait belt to guide pt from bed to chair. +2 helpful for equipment management and safety.  Ambulation/Gait             General Gait Details: Unable this session   Stairs             Wheelchair Mobility    Modified [place] (Stroke Patients Only)       Balance Overall balance assessment: Needs assistance Sitting-balance support: Feet supported;No upper extremity supported Sitting balance-Leahy Scale: Poor Sitting balance - Comments: Approaching fair. Postural control: Left lateral lean Standing balance support: Bilateral upper extremity supported Standing balance-Leahy Scale: Poor                              Cognition Arousal/Alertness: Lethargic Behavior During Therapy: Flat affect Overall Cognitive Status: Impaired/Different from baseline Area of Impairment: Safety/judgement                 Orientation Level: Disoriented to;Person;Time;Place;Situation (seems to respond more) Current Attention Level: Focused Memory: Decreased short-term memory Following Commands: Follows one step commands inconsistently;Follows one step commands with increased time Safety/Judgement: Decreased awareness of safety;Decreased awareness of deficits Awareness: Intellectual Problem Solving: Slow processing;Decreased initiation;Difficulty sequencing;Requires verbal cues;Requires tactile cues General Comments: Pt confused, swatting at sitter when PT arrived. Pt not aggressive or agitated with PT throughout session  however pt required increased multimodal cues to attend to task.      Exercises      General Comments        Pertinent Vitals/Pain Pain  Assessment: Faces Faces Pain Scale: No hurt    Home Living                      Prior Function            PT Goals (current goals can now be found in the care plan section) Acute Rehab PT Goals Patient Stated Goal: pt unable PT Goal Formulation: With family Time For Goal Achievement: 05/04/21 Potential to Achieve Goals: Fair Progress towards PT goals: Progressing toward goals    Frequency    Min 2X/week      PT Plan Current plan remains appropriate    Co-evaluation              AM-PAC PT "6 Clicks" Mobility   Outcome Measure  Help needed turning from your back to your side while in a flat bed without using bedrails?: Total Help needed moving from lying on your back to sitting on the side of a flat bed without using bedrails?: Total Help needed moving to and from a bed to a chair (including a wheelchair)?: Total Help needed standing up from a chair using your arms (e.g., wheelchair or bedside chair)?: Total Help needed to walk in hospital room?: Total Help needed climbing 3-5 steps with a railing? : Total 6 Click Score: 6    End of Session Equipment Utilized During Treatment: Gait belt Activity Tolerance: Patient tolerated treatment well Patient left: in chair;with call bell/phone within reach;with chair alarm set;with nursing/sitter in room Nurse Communication: Mobility status PT Visit Diagnosis: Muscle weakness (generalized) (M62.81);Difficulty in walking, not elsewhere classified (R26.2)     Time: [id number] PT Time Calculation (min) (ACUTE ONLY): 19 min  Charges:  $Gait Training: 8-22 mins                     Rolinda Roan, PT, DPT Acute Rehabilitation Services Pager: [phone] Office: [phone]    Thelma Comp 04/24/2021, 3:49 PM

## 2021-04-25 LAB — COMPREHENSIVE METABOLIC PANEL
ALT: 22 U/L (ref 0–44)
AST: 31 U/L (ref 15–41)
Albumin: 2.6 g/dL — ABNORMAL LOW (ref 3.5–5.0)
Alkaline Phosphatase: 70 U/L (ref 38–126)
Anion gap: 10 (ref 5–15)
BUN: 10 mg/dL (ref 8–23)
CO2: 26 mmol/L (ref 22–32)
Calcium: 9 mg/dL (ref 8.9–10.3)
Chloride: 109 mmol/L (ref 98–111)
Creatinine, Ser: 1.1 mg/dL (ref 0.61–1.24)
GFR, Estimated: >60 mL/min (ref >60)
Glucose, Bld: 89 mg/dL (ref 70–99)
Potassium: 4.2 mmol/L (ref 3.5–5.1)
Sodium: 145 mmol/L (ref 135–145)
Total Bilirubin: 1 mg/dL (ref 0.3–1.2)
Total Protein: 5.9 g/dL — ABNORMAL LOW (ref 6.5–8.1)

## 2021-04-25 MED ORDER — LACTATED RINGERS IV BOLUS
250.0000 mL | Freq: Once | INTRAVENOUS | Status: AC
  Administered 2021-04-25: 250 mL via INTRAVENOUS

## 2021-04-25 NOTE — Progress Notes (Signed)
Occupational Therapy Treatment Patient Details Name: Leon Estes MRN: [id number] DOB: [birth year] Today's Date: 04/25/2021   History of present illness Pt is an 87 y.o. male admitted on 04/18/21 for L sided weakness, confusion, aphasia.  Pt dx with encepholopathy of unknown origin, medical workup continues.  MRI stable (shows Schwannoma, but stable compared to last scan).  Pt with signiciant PMH of cataract, COPD, CAD, hearing loss, HTN, macular degeneration.   OT comments  Pt received in bed just finished getting cleaned up by NT, agreeable to OT session. Pt required minguard to progress toward EOB on right side with use of bed rails. Pt incontinent of bowels and required maxA for posterior care in standing. Pt required modA+2 to stand and stabilize during posterior care. Pt then required maxA+2 for stand-pivot to recliner on right. Pt appeared to have difficulty motor planning toward right, requiring assistance with progression of RLE. Once in recliner, pt had loss of balance to the right, required assistance from therapist to correct. RN informed. Pt will continue to benefit from skilled OT services to maximize safety and independence with ADL/IADL and functional mobility. Will continue to follow acutely and progress as tolerated.     Recommendations for follow up therapy are one component of a multi-disciplinary discharge planning process, led by the attending physician.  Recommendations may be updated based on patient status, additional functional criteria and insurance authorization.    Follow Up Recommendations  SNF;Supervision/Assistance - 24 hour    Equipment Recommendations  Other (comment) (TBD next venue)    Recommendations for Other Services      Precautions / Restrictions Precautions Precautions: Fall Precaution Comments: has been known to kick and hit (per RN) this admission Restrictions Weight Bearing Restrictions: No       Mobility Bed Mobility Overal bed mobility:  Needs Assistance Bed Mobility: Rolling;Supine to Sit Rolling: Min guard   Supine to sit: Min guard     General bed mobility comments: minguard for safety and cues for sequencing    Transfers Overall transfer level: Needs assistance Equipment used: Rolling walker (2 wheeled) Transfers: Sit to/from [facility] Sit to Stand: +2 physical assistance;Mod assist Stand pivot transfers: +2 safety/equipment;Max assist;+2 physical assistance       General transfer comment: pt with difficulty advancing BLE  and difficulty motor planning to pivot toward right from EOB to recliner. While sitting in recliner, pt with loss of balance to right    Balance Overall balance assessment: Needs assistance Sitting-balance support: Feet supported;No upper extremity supported Sitting balance-Leahy Scale: Poor   Postural control: Right lateral lean Standing balance support: Bilateral upper extremity supported Standing balance-Leahy Scale: Poor Standing balance comment: heavy reliance on BUE support on RW                           ADL either performed or assessed with clinical judgement   ADL Overall ADL's : Needs assistance/impaired     Grooming: Maximal assistance   Upper Body Bathing: Maximal assistance   Lower Body Bathing: Maximal assistance   Upper Body Dressing : Maximal assistance   Lower Body Dressing: Maximal assistance   Toilet Transfer: Maximal assistance;Stand-pivot;RW;+2 for physical assistance;+2 for safety/equipment Toilet Transfer Details (indicate cue type and reason): simulated with stand pivot from EOB to recliner on right side, max multimodal cues for sequencing, pt appeared to have right inattention, started to turn toward left and looking at therapy tech on right side when therapist on  right side was talking Toileting- Clothing Manipulation and Hygiene: Maximal assistance;+2 for physical assistance;+2 for safety/equipment;Sit to/from stand        Functional mobility during ADLs: Maximal assistance;+2 for physical assistance;+2 for safety/equipment;Rolling walker General ADL Comments: maxA due to instability, generalized weakness, cognitive limitations     Vision       Perception     Praxis      Cognition Arousal/Alertness: Awake/alert Behavior During Therapy: Flat affect Overall Cognitive Status: Impaired/Different from baseline Area of Impairment: Safety/judgement;Problem solving;Awareness;Following commands;Attention                   Current Attention Level: Sustained Memory: Decreased short-term memory Following Commands: Follows one step commands inconsistently;Follows one step commands with increased time Safety/Judgement: Decreased awareness of safety;Decreased awareness of deficits Awareness: Intellectual Problem Solving: Slow processing;Decreased initiation;Difficulty sequencing;Requires verbal cues;Requires tactile cues General Comments: pt requires max cues for sequencing, during stand-pivot pt not progressing toward right despite multimodal cues. Pt required increase ditm for processing information, had mumbled speech        Exercises     Shoulder Instructions       General Comments sitter in room at end    Pertinent Vitals/ Pain       Pain Assessment: Faces Faces Pain Scale: Hurts a little bit Pain Location: pt holding his stomach Pain Descriptors / Indicators: Guarding Pain Intervention(s): Limited activity within patient's tolerance;Monitored during session  Home Living                                          Prior Functioning/Environment              Frequency  Min 2X/week        Progress Toward Goals  OT Goals(current goals can now be found in the care plan section)  Progress towards OT goals: Progressing toward goals  Acute Rehab OT Goals Patient Stated Goal: pt unable OT Goal Formulation: Patient unable to participate in goal setting Time For  Goal Achievement: 05/05/21 Potential to Achieve Goals: Fair ADL Goals Pt Will Perform Grooming: with min assist (supported sitting) Pt Will Perform Upper Body Bathing: with min assist (supported sitting) Pt Will Transfer to Toilet: with min assist;stand pivot transfer;bedside commode Pt Will Perform Toileting - Clothing Manipulation and hygiene:  (Pt will be able to stand with min A to A with basic ADLs) Additional ADL Goal #1: Pt wil be min A supine to sit Additional ADL Goal #2: Pt will follow 50% of one step commands Additional ADL Goal #3: Pt will maintain eyes open 75% of commands  Plan Discharge plan remains appropriate    Co-evaluation                 AM-PAC OT "6 Clicks" Daily Activity     Outcome Measure   Help from another person eating meals?: A Lot Help from another person taking care of personal grooming?: A Lot Help from another person toileting, which includes using toliet, bedpan, or urinal?: A Lot Help from another person bathing (including washing, rinsing, drying)?: A Lot Help from another person to put on and taking off regular upper body clothing?: A Lot Help from another person to put on and taking off regular lower body clothing?: A Lot 6 Click Score: 12    End of Session Equipment Utilized During Treatment: Gait belt;Rolling walker  OT Visit  Diagnosis: Other abnormalities of gait and mobility (R26.89);Other symptoms and signs involving cognitive function;Low vision, both eyes (H54.2)   Activity Tolerance Patient tolerated treatment well   Patient Left with call bell/phone within reach;in chair;with chair alarm set;with nursing/sitter in room;with restraints reapplied   Nurse Communication Mobility status        Time: [id number] OT Time Calculation (min): 25 min  Charges: OT General Charges $OT Visit: 1 Visit OT Treatments $Self Care/Home Management : 23-37 mins  Helene Kelp OTR/L Acute Rehabilitation Services Office: [place] 04/25/2021, 4:14 PM

## 2021-04-25 NOTE — Progress Notes (Signed)
PROGRESS NOTE    Leon Estes  [id number] DOB: 05/05/1931 DOA: 04/18/2021 PCP: Venia Carbon, MD    Brief Narrative:  87 year old with a history of COPD, CAD, Schwanoma, HTN, HLD, severe visual loss, HOH, recent acute on chronic cholecystitis, and recurrent [name] episodes who was brought to the ER after he developed altered mental status/confusion.  In the ED MRI ruled out the possibility of an acute CVA.  Assessment & Plan:   Principal Problem:   Acute metabolic encephalopathy Active Problems:   Benign essential HTN   Macular degeneration, wet (HCC)   HOH (hard of hearing)   Recurrent Clostridioides difficile diarrhea   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)   Cholecystitis   Toxic metabolic encephalopathy    Acute toxic metabolic encephalopathy MR brain without acute findings -TSH normal -UA unrevealing -ammonia and B12 normal -UA unremarkable -no ultrasound findings to suggest acute cholecystitis -Appreciate input by Neurology . Concerns for possible underlying dementia, recommendation for outpatient neuropsych testing -Cont trial of seroquel, cont to hold gabapentin -Mentation continues to wax and wane. Confused this AM, suspect related to dehydration, see below -Cont IVF hydration per below  Diarrhea secondary to [name], recurrent -Patient continues with multiple watery stools resulting in electrolyte abnormalities -Patient has a hx of c.diff colitis 5/22 -WBC is recently elevated at around 30k, today better -Continue IV fluid hydration as tolerated -C. difficile testing was initially inconclusive, confirmatory test now positive for [name] -Initially continue patient on oral vancomycin for first recurrence -Given past history of severe [name] which resulted in severe sepsis with endorgan failure, now on dificid. Anticipate 10 days course -Stool output has improved since starting  Dificid.  -Stool culture remains pending  Dehydration -Likely related to diarrheal episodes per above -Mucus membranes and tongue noted to be markedly dry -Increase IVF to 125cc/hr. Given gentle IVF bolus this AM -Cont to monitor volume status, wean IVF as tolerated  ARF -Cr has normalized with IVF -Continued on IVF per above -repeat bmet in AM   HTN -BP stable at present -Cont IVF as tolerated   COPD Seems to be compensated at this time   Schwannoma No evidence of an acute complicating issue   Severe visual loss and hearing loss   Chronic cholecystitis   DVT prophylaxis: Lovenox subq Code Status: DNR Family Communication: Pt in room, family currently at bedside  Status is: Inpatient  Remains inpatient appropriate because:Altered mental status and Inpatient level of care appropriate due to severity of illness  Dispo: The patient is from: Home              Anticipated d/c is to: SNF              Patient currently is not medically stable to d/c.   Difficult to place patient No   Consultants:  Neurology  Procedures:    Antimicrobials: Anti-infectives (From admission, onward)    Start     Dose/Rate Route Frequency Ordered Stop   06/18/21 1000  vancomycin (VANCOCIN) capsule 125 mg  Status:  Discontinued       See Hyperspace for full Linked Orders Report.   125 mg Oral Every 3 DAYS 04/22/21 1440 04/22/21 1907   05/21/21 1000  vancomycin (VANCOCIN) capsule 125 mg  Status:  Discontinued       See Hyperspace for full Linked Orders Report.   125 mg Oral Every other day 04/22/21 1440 04/22/21 1907  05/14/21 1000  vancomycin (VANCOCIN) capsule 125 mg  Status:  Discontinued       See Hyperspace for full Linked Orders Report.   125 mg Oral Daily 04/22/21 1440 04/22/21 1907   05/06/21 2200  vancomycin (VANCOCIN) capsule 125 mg  Status:  Discontinued       See Hyperspace for full Linked Orders Report.   125 mg Oral 2 times daily 04/22/21 1440 04/22/21 1907   04/23/21  1200  fidaxomicin (DIFICID) tablet 200 mg        200 mg Oral 2 times daily 04/23/21 0535 05/03/21 0959   04/22/21 2200  fidaxomicin (DIFICID) tablet 200 mg  Status:  Discontinued        200 mg Oral 2 times daily 04/22/21 1907 04/23/21 0535   04/22/21 1800  vancomycin (VANCOCIN) capsule 125 mg  Status:  Discontinued       See Hyperspace for full Linked Orders Report.   125 mg Oral 4 times daily 04/22/21 1440 04/22/21 1907       Subjective:  Confused this AM  Objective: Vitals:   04/25/21 0027 04/25/21 0403 04/25/21 0733 04/25/21 1130  BP: 129/72 118/78 (!) 155/77 131/77  Pulse:   (!) 102 (!) 122  Resp: '20 20 20 19  '$ Temp: 97.8 F (36.6 C) 98.4 F (36.9 C)  98.7 F (37.1 C)  TempSrc: Axillary Axillary  Oral  SpO2: 98% 96% 96% 99%  Weight:        Intake/Output Summary (Last 24 hours) at 04/25/2021 1747 Last data filed at 04/25/2021 0900 Gross per 24 hour  Intake 1368.79 ml  Output 1420 ml  Net -51.21 ml    Filed Weights   04/21/21 1300  Weight: 64.9 kg    Examination: General exam: Conversant, in no acute distress, membranes very dry Respiratory system: normal chest rise, clear, no audible wheezing Cardiovascular system: regular rhythm, s1-s2 Gastrointestinal system: Nondistended, nontender, pos BS Central nervous system: No seizures, no tremors Extremities: No cyanosis, no joint deformities Skin: No rashes, no pallor Psychiatry: Affect normal // no auditory hallucinations   Data Reviewed: I have personally reviewed following labs and imaging studies  CBC: Recent Labs  Lab 04/18/21 2102 04/18/21 2113 04/19/21 1930 04/21/21 0254 04/22/21 0227 04/23/21 0227 04/24/21 0341  WBC 10.9*  --  4.4 29.0* 8.6 9.5 9.2  NEUTROABS 5.6  --   --   --   --   --   --   HGB 11.0*   < > 10.1* 12.2* 9.6* 9.2* 10.2*  HCT 36.4*   < > 28.6* 38.2* 29.6* 29.2* 31.5*  MCV 89.4  --  96.6 88.0 85.5 87.2 86.3  PLT 290  --  288 259 242 254 181   < > = values in this interval not  displayed.    Basic Metabolic Panel: Recent Labs  Lab 04/21/21 0254 04/21/21 0904 04/22/21 0227 04/23/21 0227 04/23/21 0321 04/24/21 0341 04/25/21 0416  NA 140  --  141 141  --  139 145  K 3.2*  --  2.5* 2.9*  --  3.3* 4.2  CL 98  --  102 109  --  107 109  CO2 16*  --  22 25  --  22 26  GLUCOSE 104*  --  109* 97  --  86 89  BUN 19  --  46* 37*  --  21 10  CREATININE 2.01*  --  2.19* 1.13  --  0.91 1.10  CALCIUM 8.6*  --  8.3*  8.6*  --  8.6* 9.0  MG  --  1.6* 3.0*  --  2.6*  --   --     GFR: Estimated Creatinine Clearance: 42.6 mL/min (by C-G formula based on SCr of 1.1 mg/dL). Liver Function Tests: Recent Labs  Lab 04/18/21 2102 04/21/21 0254 04/22/21 0227 04/24/21 0341 04/25/21 0416  AST 35 26 29 36 31  ALT '17 16 16 24 22  '$ ALKPHOS 86 91 63 71 70  BILITOT 0.6 1.6* 0.9 0.8 1.0  PROT 6.7 6.0* 5.2* 5.6* 5.9*  ALBUMIN 3.1* 2.6* 2.3* 2.5* 2.6*    No results for input(s): LIPASE, AMYLASE in the last 168 hours. Recent Labs  Lab 04/18/21 2250  AMMONIA 10    Coagulation Profile: Recent Labs  Lab 04/18/21 2102  INR 1.0    Cardiac Enzymes: No results for input(s): CKTOTAL, CKMB, CKMBINDEX, TROPONINI in the last 168 hours. BNP (last 3 results) No results for input(s): PROBNP in the last 8760 hours. HbA1C: No results for input(s): HGBA1C in the last 72 hours. CBG: Recent Labs  Lab 04/18/21 2103  GLUCAP 100*    Lipid Profile: No results for input(s): CHOL, HDL, LDLCALC, TRIG, CHOLHDL, LDLDIRECT in the last 72 hours. Thyroid Function Tests: No results for input(s): TSH, T4TOTAL, FREET4, T3FREE, THYROIDAB in the last 72 hours.  Anemia Panel: No results for input(s): VITAMINB12, FOLATE, FERRITIN, TIBC, IRON, RETICCTPCT in the last 72 hours.  Sepsis Labs: Recent Labs  Lab 04/19/21 0043 04/19/21 0101  PROCALCITON  --  <0.10  LATICACIDVEN 1.6  --      Recent Results (from the past 240 hour(s))  Resp Panel by RT-PCR (Flu A&B, Covid)     Status: None    Collection Time: 04/18/21 10:50 PM   Specimen: Nasopharyngeal(NP) swabs in vial transport medium  Result Value Ref Range Status   SARS Coronavirus 2 by RT PCR NEGATIVE NEGATIVE Final    Comment: (NOTE) SARS-CoV-2 target nucleic acids are NOT DETECTED.  The SARS-CoV-2 RNA is generally detectable in upper respiratory specimens during the acute phase of infection. The lowest concentration of SARS-CoV-2 viral copies this assay can detect is 138 copies/mL. A negative result does not preclude SARS-Cov-2 infection and should not be used as the sole basis for treatment or other patient management decisions. A negative result may occur with  improper specimen collection/handling, submission of specimen other than nasopharyngeal swab, presence of viral mutation(s) within the areas targeted by this assay, and inadequate number of viral copies(<138 copies/mL). A negative result must be combined with clinical observations, patient history, and epidemiological information. The expected result is Negative.  Fact Sheet for Patients:  EntrepreneurPulse.com.au  Fact Sheet for Healthcare Providers:  IncredibleEmployment.be  This test is no t yet approved or cleared by the [place] FDA and  has been authorized for detection and/or diagnosis of SARS-CoV-2 by FDA under an Emergency Use Authorization (EUA). This EUA will remain  in effect (meaning this test can be used) for the duration of the COVID-19 declaration under Section 564(b)(1) of the Act, 21 U.S.C.section 360bbb-3(b)(1), unless the authorization is terminated  or revoked sooner.       Influenza A by PCR NEGATIVE NEGATIVE Final   Influenza B by PCR NEGATIVE NEGATIVE Final    Comment: (NOTE) The Xpert Xpress SARS-CoV-2/FLU/RSV plus assay is intended as an aid in the diagnosis of influenza from Nasopharyngeal swab specimens and should not be used as a sole basis for treatment. Nasal washings  and aspirates are unacceptable  for Xpert Xpress SARS-CoV-2/FLU/RSV testing.  Fact Sheet for Patients: EntrepreneurPulse.com.au  Fact Sheet for Healthcare Providers: IncredibleEmployment.be  This test is not yet approved or cleared by the [place] FDA and has been authorized for detection and/or diagnosis of SARS-CoV-2 by FDA under an Emergency Use Authorization (EUA). This EUA will remain in effect (meaning this test can be used) for the duration of the COVID-19 declaration under Section 564(b)(1) of the Act, 21 U.S.C. section 360bbb-3(b)(1), unless the authorization is terminated or revoked.  Performed at [facility], [place] [address], [place], [place]  [address]   Culture, blood (routine x 2)     Status: None (Preliminary result)   Collection Time: 04/21/21  9:04 AM   Specimen: Right Antecubital; Blood  Result Value Ref Range Status   Specimen Description RIGHT ANTECUBITAL  Final   Special Requests   Final    BOTTLES DRAWN AEROBIC AND ANAEROBIC Blood Culture adequate volume   Culture   Final    NO GROWTH 4 DAYS Performed at [facility], [place] [address] [place], [place] [address]    Report Status PENDING  Incomplete  Culture, blood (routine x 2)     Status: Abnormal   Collection Time: 04/21/21  9:14 AM   Specimen: BLOOD RIGHT ARM  Result Value Ref Range Status   Specimen Description BLOOD RIGHT ARM  Final   Special Requests   Final    BOTTLES DRAWN AEROBIC AND ANAEROBIC Blood Culture adequate volume   Culture  Setup Time   Final    GRAM POSITIVE COCCI IN BOTH AEROBIC AND ANAEROBIC BOTTLES CRITICAL RESULT CALLED TO, READ BACK BY AND VERIFIED WITH: PHARMD GREG ABBOTT 04/22/21'@3'$ :73 BY TW    Culture (A)  Final    STAPHYLOCOCCUS EPIDERMIDIS STAPHYLOCOCCUS HAEMOLYTICUS THE SIGNIFICANCE OF ISOLATING THIS ORGANISM FROM A SINGLE SET OF BLOOD CULTURES WHEN MULTIPLE SETS ARE DRAWN IS UNCERTAIN. PLEASE NOTIFY THE MICROBIOLOGY  DEPARTMENT WITHIN ONE WEEK IF SPECIATION AND SENSITIVITIES ARE REQUIRED. Performed at [facility], [place] [address] [place], [place] [address]    Report Status 04/24/2021 FINAL  Final  Blood Culture ID Panel (Reflexed)     Status: Abnormal   Collection Time: 04/21/21  9:14 AM  Result Value Ref Range Status   Enterococcus faecalis NOT DETECTED NOT DETECTED Final   Enterococcus Faecium NOT DETECTED NOT DETECTED Final   Listeria monocytogenes NOT DETECTED NOT DETECTED Final   Staphylococcus species DETECTED (A) NOT DETECTED Final    Comment: CRITICAL RESULT CALLED TO, READ BACK BY AND VERIFIED WITH: PHARMD GREG ABBOTT 04/22/21'@3'$ :36 BY TW    Staphylococcus aureus (BCID) NOT DETECTED NOT DETECTED Final   Staphylococcus epidermidis DETECTED (A) NOT DETECTED Final    Comment: Methicillin (oxacillin) resistant coagulase negative staphylococcus. Possible blood culture contaminant (unless isolated from more than one blood culture draw or clinical case suggests pathogenicity). No antibiotic treatment is indicated for blood  culture contaminants. CRITICAL RESULT CALLED TO, READ BACK BY AND VERIFIED WITH: PHARMD GREG ABBOTT 04/22/21'@3'$ :36 BY TW    Staphylococcus lugdunensis NOT DETECTED NOT DETECTED Final   Streptococcus species NOT DETECTED NOT DETECTED Final   Streptococcus agalactiae NOT DETECTED NOT DETECTED Final   Streptococcus pneumoniae NOT DETECTED NOT DETECTED Final   Streptococcus pyogenes NOT DETECTED NOT DETECTED Final   A.calcoaceticus-baumannii NOT DETECTED NOT DETECTED Final   Bacteroides fragilis NOT DETECTED NOT DETECTED Final   Enterobacterales NOT DETECTED NOT DETECTED Final   Enterobacter cloacae complex NOT DETECTED NOT DETECTED  Final   Escherichia coli NOT DETECTED NOT DETECTED Final   Klebsiella aerogenes NOT DETECTED NOT DETECTED Final   Klebsiella oxytoca NOT DETECTED NOT DETECTED Final   Klebsiella pneumoniae NOT DETECTED NOT DETECTED Final   Proteus species NOT  DETECTED NOT DETECTED Final   Salmonella species NOT DETECTED NOT DETECTED Final   Serratia marcescens NOT DETECTED NOT DETECTED Final   Haemophilus influenzae NOT DETECTED NOT DETECTED Final   Neisseria meningitidis NOT DETECTED NOT DETECTED Final   Pseudomonas aeruginosa NOT DETECTED NOT DETECTED Final   Stenotrophomonas maltophilia NOT DETECTED NOT DETECTED Final   Candida albicans NOT DETECTED NOT DETECTED Final   Candida auris NOT DETECTED NOT DETECTED Final   Candida glabrata NOT DETECTED NOT DETECTED Final   Candida krusei NOT DETECTED NOT DETECTED Final   Candida parapsilosis NOT DETECTED NOT DETECTED Final   Candida tropicalis NOT DETECTED NOT DETECTED Final   Cryptococcus neoformans/gattii NOT DETECTED NOT DETECTED Final   Methicillin resistance mecA/C DETECTED (A) NOT DETECTED Final    Comment: CRITICAL RESULT CALLED TO, READ BACK BY AND VERIFIED WITH: PHARMD GREG ABBOTT 04/22/21'@3'$ :36 BY TW Performed at [facility], [address], [place], [place] [address]   C Difficile Quick Screen (NO PCR Reflex)     Status: Abnormal   Collection Time: 04/21/21 12:00 PM   Specimen: Urine, Clean Catch; Stool  Result Value Ref Range Status   C Diff antigen POSITIVE (A) NEGATIVE Final   C Diff toxin NEGATIVE NEGATIVE Final   C Diff interpretation   Final    Results are indeterminate. Please contact the provider listed for your campus for C diff questions in [place].    Comment: Performed at [facility], [place] [address], [place], [place] [address]  C. Diff by PCR, Reflexed     Status: Abnormal   Collection Time: 04/21/21 12:00 PM  Result Value Ref Range Status   Toxigenic C. Difficile by PCR POSITIVE (A) NEGATIVE Final    Comment: Positive for toxigenic C. difficile with little to no toxin production. Only treat if clinical presentation suggests symptomatic illness. Performed at [facility], [place] [address] [place], [place] [address]       Radiology Studies: No  results found.  Scheduled Meds:  enoxaparin (LOVENOX) injection  40 mg Subcutaneous Q24H   fidaxomicin  200 mg Oral BID   mometasone-formoterol  2 puff Inhalation BID   QUEtiapine  25 mg Oral QHS   tamsulosin  0.4 mg Oral QHS   Continuous Infusions:  lactated ringers 125 mL/hr at 04/25/21 1359     LOS: 5 days   Marylu Lund, MD [facility] Pager On Amion  If 7PM-7AM, please contact night-coverage 04/25/2021, 5:47 PM

## 2021-04-26 LAB — COMPREHENSIVE METABOLIC PANEL
ALT: 21 U/L (ref 0–44)
AST: 22 U/L (ref 15–41)
Albumin: 2.6 g/dL — ABNORMAL LOW (ref 3.5–5.0)
Alkaline Phosphatase: 68 U/L (ref 38–126)
Anion gap: 14 (ref 5–15)
BUN: 5 mg/dL — ABNORMAL LOW (ref 8–23)
CO2: 28 mmol/L (ref 22–32)
Calcium: 9 mg/dL (ref 8.9–10.3)
Chloride: 97 mmol/L — ABNORMAL LOW (ref 98–111)
Creatinine, Ser: 0.75 mg/dL (ref 0.61–1.24)
GFR, Estimated: >60 mL/min (ref >60)
Glucose, Bld: 87 mg/dL (ref 70–99)
Potassium: 2.7 mmol/L — CL (ref 3.5–5.1)
Sodium: 139 mmol/L (ref 135–145)
Total Bilirubin: 0.6 mg/dL (ref 0.3–1.2)
Total Protein: 6 g/dL — ABNORMAL LOW (ref 6.5–8.1)

## 2021-04-26 LAB — CBC
HCT: 32.1 % — ABNORMAL LOW (ref 39.0–52.0)
Hemoglobin: 10.3 g/dL — ABNORMAL LOW (ref 13.0–17.0)
MCH: 27.6 pg (ref 26.0–34.0)
MCHC: 32.1 g/dL (ref 30.0–36.0)
MCV: 86.1 fL (ref 80.0–100.0)
Platelets: 174 10*3/uL (ref 150–400)
RBC: 3.73 MIL/uL — ABNORMAL LOW (ref 4.22–5.81)
RDW: 18.7 % — ABNORMAL HIGH (ref 11.5–15.5)
WBC: 9 10*3/uL (ref 4.0–10.5)
nRBC: 0 % (ref 0.0–0.2)

## 2021-04-26 LAB — CULTURE, BLOOD (ROUTINE X 2)
Culture: NO GROWTH
Special Requests: ADEQUATE

## 2021-04-26 LAB — MAGNESIUM: Magnesium: 1.4 mg/dL — ABNORMAL LOW (ref 1.7–2.4)

## 2021-04-26 MED ORDER — POTASSIUM CHLORIDE 20 MEQ PO PACK
40.0000 meq | PACK | Freq: Once | ORAL | Status: DC
  Filled 2021-04-26: qty 2

## 2021-04-26 MED ORDER — POTASSIUM CHLORIDE 10 MEQ/100ML IV SOLN
10.0000 meq | INTRAVENOUS | Status: AC
  Administered 2021-04-26 (×2): 10 meq via INTRAVENOUS
  Filled 2021-04-26 (×2): qty 100

## 2021-04-26 MED ORDER — MAGNESIUM SULFATE 2 GM/50ML IV SOLN
2.0000 g | Freq: Once | INTRAVENOUS | Status: AC
  Administered 2021-04-26: 2 g via INTRAVENOUS
  Filled 2021-04-26: qty 50

## 2021-04-26 MED ORDER — POTASSIUM CHLORIDE 10 MEQ/100ML IV SOLN
10.0000 meq | INTRAVENOUS | Status: AC
Start: 2021-04-26 — End: 2021-04-26
  Administered 2021-04-26 (×4): 10 meq via INTRAVENOUS
  Filled 2021-04-26 (×4): qty 100

## 2021-04-26 NOTE — Progress Notes (Signed)
Patient has one PIV access on Lt. UA,  and patient is eating patient. no reason to put in the PIV access at this time. Explained patient's RN this assessment. She will put in the consult if she need in the future when patient's condition changes. HS [employer]

## 2021-04-26 NOTE — Progress Notes (Signed)
PROGRESS NOTE    Leon Estes  [id number] DOB: 1930/08/21 DOA: 04/18/2021 PCP: Venia Carbon, MD   Brief Narrative:  This 87 year old Male with history of COPD, CAD, Schwanoma, HTN, HLD, severe visual loss, HOH, recent acute on chronic cholecystitis, and recurrent [name] episodes who was brought to the ER after he developed altered mental status/confusion.  In the ED MRI ruled out the possibility of an acute CVA.  Patient found to have a [name] and started on Dificid for 10 days  Assessment & Plan:   Principal Problem:   Acute metabolic encephalopathy Active Problems:   Benign essential HTN   Macular degeneration, wet (HCC)   HOH (hard of hearing)   Recurrent Clostridioides difficile diarrhea   Neoplasm of brain causing mass effect on adjacent structures ([place])   Cholecystitis   Toxic metabolic encephalopathy   Acute toxic metabolic encephalopathy : Patient presented with altered mentation. MRI brain unremarkable, TSH normal.  UA normal.  ammonia and B12 normal. Ultrasound abdomen negative for acute cholecystitis. Neurology consulted . concerns for possible underlying dementia, recommendation for outpatient neuropsych testing. Continue trial of Seroquel, continue to hold gabapentin. Mental status waxes and wanes.Patient seems confused in the morning,  could be related to dehydration. Continue IV fluids.   Diarrhea secondary to [name], recurrent: Patient continues to have multiple loose watery stools resulting in electrolyte abnormalities. Patient does have a history of [name] in May 2022. Patient recently had leukocytosis around 30 K which is improving. Continue IV hydration. C. difficile testing was initially inconclusive, confirmatory test now positive for [name] Initially patient continued on oral vancomycin for first recurrence. Given past history of severe [name] which resulted in  severe sepsis with endorgan failure, now on dificid. Plan for 10 days course Stool output has improved since he started Dificid. Stool culture pending.   Dehydration > Improved. Likely related to diarrhea.  Much improved.   Acute kidney injury > Resolved. Cr has normalized with IVF. Continue IV hydration.  Hypokalemia, hypomagnesemia: Replacement in progress, recheck in a.m.   Hypertension: Blood pressure is stable.  COPD Seems to be compensated at this time.   Schwannoma No evidence of an acute complicating issue   Severe visual loss and hearing loss.  Stable.    DVT prophylaxis: Lovenox. Code Status:  DNR Family Communication:No family at bed side. Disposition Plan:   Status is: Inpatient  Remains inpatient appropriate because:Inpatient level of care appropriate due to severity of illness  Dispo: The patient is from: Home              Anticipated d/c is to: SNF              Patient currently is not medically stable to d/c.   Difficult to place patient No  Consultants:   None  Procedures: None  Antimicrobials:   Anti-infectives (From admission, onward)    Start     Dose/Rate Route Frequency Ordered Stop   06/18/21 1000  vancomycin (VANCOCIN) capsule 125 mg  Status:  Discontinued       See Hyperspace for full Linked Orders Report.   125 mg Oral Every 3 DAYS 04/22/21 1440 04/22/21 1907   05/21/21 1000  vancomycin (VANCOCIN) capsule 125 mg  Status:  Discontinued       See Hyperspace for full Linked Orders Report.   125 mg Oral Every other day 04/22/21 1440 04/22/21 1907   05/14/21 1000  vancomycin (  VANCOCIN) capsule 125 mg  Status:  Discontinued       See Hyperspace for full Linked Orders Report.   125 mg Oral Daily 04/22/21 1440 04/22/21 1907   05/06/21 2200  vancomycin (VANCOCIN) capsule 125 mg  Status:  Discontinued       See Hyperspace for full Linked Orders Report.   125 mg Oral 2 times daily 04/22/21 1440 04/22/21 1907   04/23/21 1200  fidaxomicin  (DIFICID) tablet 200 mg        200 mg Oral 2 times daily 04/23/21 0535 05/03/21 0959   04/22/21 2200  fidaxomicin (DIFICID) tablet 200 mg  Status:  Discontinued        200 mg Oral 2 times daily 04/22/21 1907 04/23/21 0535   04/22/21 1800  vancomycin (VANCOCIN) capsule 125 mg  Status:  Discontinued       See Hyperspace for full Linked Orders Report.   125 mg Oral 4 times daily 04/22/21 1440 04/22/21 1907       Subjective: Patient was seen and examined at bedside.  Overnight events noted.   Patient still seems confused.  Patient is alert, arousable  and following commands.   Still has 3-4 loose stools, patient remains on Dificid.  Objective: Vitals:   04/26/21 0007 04/26/21 0346 04/26/21 0732 04/26/21 1223  BP: (!) 149/79 (!) 166/81 124/65 118/66  Pulse: (!) 109 (!) 102 85 93  Resp: '18 18 20 19  '$ Temp: 98 F (36.7 C) 98.8 F (37.1 C)  98.3 F (36.8 C)  TempSrc:    Oral  SpO2: 100% 100% 98% 95%  Weight:        Intake/Output Summary (Last 24 hours) at 04/26/2021 1434 Last data filed at 04/26/2021 0011 Gross per 24 hour  Intake 300 ml  Output 1200 ml  Net -900 ml   Filed Weights   04/21/21 1300  Weight: 64.9 kg    Examination:  General exam: Appears comfortable, not in any acute distress. Respiratory system: Clear to auscultation bilaterally, no wheezing. Cardiovascular system: S1-S2 heard, regular rate and rhythm, no murmur. Gastrointestinal system: Abdomen is soft, nontender, nondistended, BS+ Central nervous system: Alert and oriented x 1. No focal neurological deficits. Extremities: No edema, no cyanosis, no clubbing. Skin: No rashes, lesions or ulcers Psychiatry: Judgement and insight appear normal. Mood & affect appropriate.     Data Reviewed: I have personally reviewed following labs and imaging studies  CBC: Recent Labs  Lab 04/21/21 0254 04/22/21 0227 04/23/21 0227 04/24/21 0341 04/26/21 0316  WBC 29.0* 8.6 9.5 9.2 9.0  HGB 12.2* 9.6* 9.2* 10.2*  10.3*  HCT 38.2* 29.6* 29.2* 31.5* 32.1*  MCV 88.0 85.5 87.2 86.3 86.1  PLT 259 242 254 181 [id number]   Basic Metabolic Panel: Recent Labs  Lab 04/21/21 0904 04/22/21 0227 04/23/21 0227 04/23/21 0321 04/24/21 0341 04/25/21 0416 04/26/21 0316  NA  --  141 141  --  139 145 139  K  --  2.5* 2.9*  --  3.3* 4.2 2.7*  CL  --  102 109  --  107 109 97*  CO2  --  22 25  --  '22 26 28  '$ GLUCOSE  --  109* 97  --  86 89 87  BUN  --  46* 37*  --  21 10 5*  CREATININE  --  2.19* 1.13  --  0.91 1.10 0.75  CALCIUM  --  8.3* 8.6*  --  8.6* 9.0 9.0  MG 1.6* 3.0*  --  2.6*  --   --  1.4*   GFR: Estimated Creatinine Clearance: 58.6 mL/min (by C-G formula based on SCr of 0.75 mg/dL). Liver Function Tests: Recent Labs  Lab 04/21/21 0254 04/22/21 0227 04/24/21 0341 04/25/21 0416 04/26/21 0316  AST 26 29 36 31 22  ALT '16 16 24 22 21  '$ ALKPHOS 91 63 71 70 68  BILITOT 1.6* 0.9 0.8 1.0 0.6  PROT 6.0* 5.2* 5.6* 5.9* 6.0*  ALBUMIN 2.6* 2.3* 2.5* 2.6* 2.6*   No results for input(s): LIPASE, AMYLASE in the last 168 hours. No results for input(s): AMMONIA in the last 168 hours. Coagulation Profile: No results for input(s): INR, PROTIME in the last 168 hours. Cardiac Enzymes: No results for input(s): CKTOTAL, CKMB, CKMBINDEX, TROPONINI in the last 168 hours. BNP (last 3 results) No results for input(s): PROBNP in the last 8760 hours. HbA1C: No results for input(s): HGBA1C in the last 72 hours. CBG: No results for input(s): GLUCAP in the last 168 hours. Lipid Profile: No results for input(s): CHOL, HDL, LDLCALC, TRIG, CHOLHDL, LDLDIRECT in the last 72 hours. Thyroid Function Tests: No results for input(s): TSH, T4TOTAL, FREET4, T3FREE, THYROIDAB in the last 72 hours. Anemia Panel: No results for input(s): VITAMINB12, FOLATE, FERRITIN, TIBC, IRON, RETICCTPCT in the last 72 hours. Sepsis Labs: No results for input(s): PROCALCITON, LATICACIDVEN in the last 168 hours.  Recent Results (from the past  240 hour(s))  Resp Panel by RT-PCR (Flu A&B, Covid)     Status: None   Collection Time: 04/18/21 10:50 PM   Specimen: Nasopharyngeal(NP) swabs in vial transport medium  Result Value Ref Range Status   SARS Coronavirus 2 by RT PCR NEGATIVE NEGATIVE Final    Comment: (NOTE) SARS-CoV-2 target nucleic acids are NOT DETECTED.  The SARS-CoV-2 RNA is generally detectable in upper respiratory specimens during the acute phase of infection. The lowest concentration of SARS-CoV-2 viral copies this assay can detect is 138 copies/mL. A negative result does not preclude SARS-Cov-2 infection and should not be used as the sole basis for treatment or other patient management decisions. A negative result may occur with  improper specimen collection/handling, submission of specimen other than nasopharyngeal swab, presence of viral mutation(s) within the areas targeted by this assay, and inadequate number of viral copies(<138 copies/mL). A negative result must be combined with clinical observations, patient history, and epidemiological information. The expected result is Negative.  Fact Sheet for Patients:  EntrepreneurPulse.com.au  Fact Sheet for Healthcare Providers:  IncredibleEmployment.be  This test is no t yet approved or cleared by the [place] FDA and  has been authorized for detection and/or diagnosis of SARS-CoV-2 by FDA under an Emergency Use Authorization (EUA). This EUA will remain  in effect (meaning this test can be used) for the duration of the COVID-19 declaration under Section 564(b)(1) of the Act, 21 U.S.C.section 360bbb-3(b)(1), unless the authorization is terminated  or revoked sooner.       Influenza A by PCR NEGATIVE NEGATIVE Final   Influenza B by PCR NEGATIVE NEGATIVE Final    Comment: (NOTE) The Xpert Xpress SARS-CoV-2/FLU/RSV plus assay is intended as an aid in the diagnosis of influenza from Nasopharyngeal swab specimens  and should not be used as a sole basis for treatment. Nasal washings and aspirates are unacceptable for Xpert Xpress SARS-CoV-2/FLU/RSV testing.  Fact Sheet for Patients: EntrepreneurPulse.com.au  Fact Sheet for Healthcare Providers: IncredibleEmployment.be  This test is not yet approved or cleared by the [place] and has been authorized  for detection and/or diagnosis of SARS-CoV-2 by FDA under an Emergency Use Authorization (EUA). This EUA will remain in effect (meaning this test can be used) for the duration of the COVID-19 declaration under Section 564(b)(1) of the Act, 21 U.S.C. section 360bbb-3(b)(1), unless the authorization is terminated or revoked.  Performed at [facility], [place] [address] [place], [place] [address]   Culture, blood (routine x 2)     Status: None   Collection Time: 04/21/21  9:04 AM   Specimen: Right Antecubital; Blood  Result Value Ref Range Status   Specimen Description RIGHT ANTECUBITAL  Final   Special Requests   Final    BOTTLES DRAWN AEROBIC AND ANAEROBIC Blood Culture adequate volume   Culture   Final    NO GROWTH 5 DAYS Performed at [facility], [place] [address] [place], [place] [address]    Report Status 04/26/2021 FINAL  Final  Culture, blood (routine x 2)     Status: Abnormal   Collection Time: 04/21/21  9:14 AM   Specimen: BLOOD RIGHT ARM  Result Value Ref Range Status   Specimen Description BLOOD RIGHT ARM  Final   Special Requests   Final    BOTTLES DRAWN AEROBIC AND ANAEROBIC Blood Culture adequate volume   Culture  Setup Time   Final    GRAM POSITIVE COCCI IN BOTH AEROBIC AND ANAEROBIC BOTTLES CRITICAL RESULT CALLED TO, READ BACK BY AND VERIFIED WITH: PHARMD GREG ABBOTT 04/22/21'@3'$ :36 BY TW    Culture (A)  Final    STAPHYLOCOCCUS EPIDERMIDIS STAPHYLOCOCCUS HAEMOLYTICUS THE SIGNIFICANCE OF ISOLATING THIS ORGANISM FROM A SINGLE SET OF BLOOD CULTURES WHEN MULTIPLE SETS ARE  DRAWN IS UNCERTAIN. PLEASE NOTIFY THE MICROBIOLOGY DEPARTMENT WITHIN ONE WEEK IF SPECIATION AND SENSITIVITIES ARE REQUIRED. Performed at [facility], [place] [address] [place], [place] [address]    Report Status 04/24/2021 FINAL  Final  Blood Culture ID Panel (Reflexed)     Status: Abnormal   Collection Time: 04/21/21  9:14 AM  Result Value Ref Range Status   Enterococcus faecalis NOT DETECTED NOT DETECTED Final   Enterococcus Faecium NOT DETECTED NOT DETECTED Final   Listeria monocytogenes NOT DETECTED NOT DETECTED Final   Staphylococcus species DETECTED (A) NOT DETECTED Final    Comment: CRITICAL RESULT CALLED TO, READ BACK BY AND VERIFIED WITH: PHARMD GREG ABBOTT 04/22/21'@3'$ :36 BY TW    Staphylococcus aureus (BCID) NOT DETECTED NOT DETECTED Final   Staphylococcus epidermidis DETECTED (A) NOT DETECTED Final    Comment: Methicillin (oxacillin) resistant coagulase negative staphylococcus. Possible blood culture contaminant (unless isolated from more than one blood culture draw or clinical case suggests pathogenicity). No antibiotic treatment is indicated for blood  culture contaminants. CRITICAL RESULT CALLED TO, READ BACK BY AND VERIFIED WITH: PHARMD GREG ABBOTT 04/22/21'@3'$ :36 BY TW    Staphylococcus lugdunensis NOT DETECTED NOT DETECTED Final   Streptococcus species NOT DETECTED NOT DETECTED Final   Streptococcus agalactiae NOT DETECTED NOT DETECTED Final   Streptococcus pneumoniae NOT DETECTED NOT DETECTED Final   Streptococcus pyogenes NOT DETECTED NOT DETECTED Final   A.calcoaceticus-baumannii NOT DETECTED NOT DETECTED Final   Bacteroides fragilis NOT DETECTED NOT DETECTED Final   Enterobacterales NOT DETECTED NOT DETECTED Final   Enterobacter cloacae complex NOT DETECTED NOT DETECTED Final   Escherichia coli NOT DETECTED NOT DETECTED Final   Klebsiella aerogenes NOT DETECTED NOT DETECTED Final   Klebsiella oxytoca NOT DETECTED NOT DETECTED Final   Klebsiella pneumoniae NOT  DETECTED NOT DETECTED  Final   Proteus species NOT DETECTED NOT DETECTED Final   Salmonella species NOT DETECTED NOT DETECTED Final   Serratia marcescens NOT DETECTED NOT DETECTED Final   Haemophilus influenzae NOT DETECTED NOT DETECTED Final   Neisseria meningitidis NOT DETECTED NOT DETECTED Final   Pseudomonas aeruginosa NOT DETECTED NOT DETECTED Final   Stenotrophomonas maltophilia NOT DETECTED NOT DETECTED Final   Candida albicans NOT DETECTED NOT DETECTED Final   Candida auris NOT DETECTED NOT DETECTED Final   Candida glabrata NOT DETECTED NOT DETECTED Final   Candida krusei NOT DETECTED NOT DETECTED Final   Candida parapsilosis NOT DETECTED NOT DETECTED Final   Candida tropicalis NOT DETECTED NOT DETECTED Final   Cryptococcus neoformans/gattii NOT DETECTED NOT DETECTED Final   Methicillin resistance mecA/C DETECTED (A) NOT DETECTED Final    Comment: CRITICAL RESULT CALLED TO, READ BACK BY AND VERIFIED WITH: PHARMD GREG ABBOTT 04/22/21'@3'$ :36 BY TW Performed at [place] [address], [place], [place] [address]   C Difficile Quick Screen (NO PCR Reflex)     Status: Abnormal   Collection Time: 04/21/21 12:00 PM   Specimen: Urine, Clean Catch; Stool  Result Value Ref Range Status   C Diff antigen POSITIVE (A) NEGATIVE Final   C Diff toxin NEGATIVE NEGATIVE Final   C Diff interpretation   Final    Results are indeterminate. Please contact the provider listed for your campus for C diff questions in [place].    Comment: Performed at [facility], [place] [address] [place], [place] [address]  C. Diff by PCR, Reflexed     Status: Abnormal   Collection Time: 04/21/21 12:00 PM  Result Value Ref Range Status   Toxigenic C. Difficile by PCR POSITIVE (A) NEGATIVE Final    Comment: Positive for toxigenic C. difficile with little to no toxin production. Only treat if clinical presentation suggests symptomatic illness. Performed at [facility], [place] [address]  [place], [place] [address]     Radiology Studies: No results found.  Scheduled Meds:  enoxaparin (LOVENOX) injection  40 mg Subcutaneous Q24H   fidaxomicin  200 mg Oral BID   mometasone-formoterol  2 puff Inhalation BID   potassium chloride  40 mEq Oral Once   QUEtiapine  25 mg Oral QHS   tamsulosin  0.4 mg Oral QHS   Continuous Infusions:  lactated ringers 125 mL/hr at 04/26/21 0417     LOS: 6 days    Time spent: 35 mins    Dreshon Proffit, MD [facility]   If 7PM-7AM, please contact night-coverage

## 2021-04-27 LAB — CBC
HCT: 27.3 % — ABNORMAL LOW (ref 39.0–52.0)
Hemoglobin: 8.9 g/dL — ABNORMAL LOW (ref 13.0–17.0)
MCH: 27.6 pg (ref 26.0–34.0)
MCHC: 32.6 g/dL (ref 30.0–36.0)
MCV: 84.5 fL (ref 80.0–100.0)
Platelets: 229 10*3/uL (ref 150–400)
RBC: 3.23 MIL/uL — ABNORMAL LOW (ref 4.22–5.81)
RDW: 18.5 % — ABNORMAL HIGH (ref 11.5–15.5)
WBC: 6.5 10*3/uL (ref 4.0–10.5)
nRBC: 0 % (ref 0.0–0.2)

## 2021-04-27 LAB — BASIC METABOLIC PANEL
Anion gap: 6 (ref 5–15)
BUN: 7 mg/dL — ABNORMAL LOW (ref 8–23)
CO2: 28 mmol/L (ref 22–32)
Calcium: 8 mg/dL — ABNORMAL LOW (ref 8.9–10.3)
Chloride: 101 mmol/L (ref 98–111)
Creatinine, Ser: 0.85 mg/dL (ref 0.61–1.24)
GFR, Estimated: >60 mL/min (ref >60)
Glucose, Bld: 93 mg/dL (ref 70–99)
Potassium: 2.4 mmol/L — CL (ref 3.5–5.1)
Sodium: 135 mmol/L (ref 135–145)

## 2021-04-27 LAB — PHOSPHORUS: Phosphorus: 2.7 mg/dL (ref 2.5–4.6)

## 2021-04-27 LAB — MAGNESIUM: Magnesium: 1.4 mg/dL — ABNORMAL LOW (ref 1.7–2.4)

## 2021-04-27 MED ORDER — POTASSIUM CHLORIDE 20 MEQ PO PACK
40.0000 meq | PACK | Freq: Once | ORAL | Status: AC
  Administered 2021-04-27: 40 meq via ORAL
  Filled 2021-04-27: qty 2

## 2021-04-27 MED ORDER — POTASSIUM CHLORIDE 10 MEQ/100ML IV SOLN
10.0000 meq | INTRAVENOUS | Status: AC
Start: 2021-04-27 — End: 2021-04-27
  Administered 2021-04-27 (×6): 10 meq via INTRAVENOUS
  Filled 2021-04-27 (×7): qty 100

## 2021-04-27 MED ORDER — IPRATROPIUM-ALBUTEROL 0.5-2.5 (3) MG/3ML IN SOLN: 3.0000 mL | RESPIRATORY_TRACT | Status: DC | PRN

## 2021-04-27 MED ORDER — MAGNESIUM SULFATE 2 GM/50ML IV SOLN
2.0000 g | Freq: Once | INTRAVENOUS | Status: AC
  Administered 2021-04-27: 2 g via INTRAVENOUS
  Filled 2021-04-27: qty 50

## 2021-04-27 NOTE — Progress Notes (Signed)
PROGRESS NOTE    Leon Estes  [id number] DOB: 02/14/1931 DOA: 04/18/2021 PCP: Venia Carbon, MD   Brief Narrative:  This 87 year old Male with history of COPD, CAD, Schwanoma, HTN, HLD, severe visual loss, HOH, recent acute on chronic cholecystitis, and recurrent [name] episodes who was brought to the ER after he developed altered mental status/confusion.  In the ED MRI ruled out the possibility of an acute CVA.  Patient found to have a 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 MRI brain unremarkable, TSH normal.  UA normal.  ammonia and B12 normal. Ultrasound abdomen negative for acute cholecystitis. Neurology consulted . concerns for possible underlying dementia, recommendation for outpatient neuropsych testing. Continue trial of Seroquel, continue to hold gabapentin. Mental status waxes and wanes. Patient appears much improved. He seems at his baseline. Continue gentle IV hydration   Diarrhea secondary to [name], recurrent: Patient continues to have multiple loose watery stools resulting in electrolyte abnormalities. Patient does have a history of [name] in May 2022. Patient recently had leukocytosis around 30 K which is improving. Continue IV hydration. C. difficile testing was initially inconclusive, confirmatory test now positive for [name] Initially patient continued on oral vancomycin for first recurrence. Given past history of severe [name] which resulted in severe  sepsis with endorgan failure, now on dificid.  Plan for 10 days course of Dificid therapy. Stool output has improved since he started Dificid. Stool culture pending.   Dehydration > Improved. Likely related to diarrhea.  Much improved.   Acute kidney injury > Resolved. Cr has normalized with IVF. Continue IV hydration.  Hypokalemia, hypomagnesemia: Replacement in progress, recheck in a.m.   Hypertension: Blood pressure is stable.  COPD Seems to be compensated at this time.   Schwannoma No evidence of an acute complicating issue   Severe visual loss and hearing loss.  Stable.    DVT prophylaxis: Lovenox. Code Status:  DNR Family Communication:No family at bed side. Disposition Plan:   Status is: Inpatient  Remains inpatient appropriate because:Inpatient level of care appropriate due to severity of illness  Dispo: The patient is from: Home              Anticipated d/c is to: SNF              Patient currently is not medically stable to d/c.   Difficult to place patient No  Consultants:   None  Procedures: None  Antimicrobials:   Anti-infectives (From admission, onward)    Start     Dose/Rate Route Frequency Ordered Stop   06/18/21 1000  vancomycin (VANCOCIN) capsule 125 mg  Status:  Discontinued       See Hyperspace for full Linked Orders Report.   125 mg Oral Every 3 DAYS 04/22/21 1440 04/22/21 1907   05/21/21 1000  vancomycin (VANCOCIN) capsule 125 mg  Status:  Discontinued       See Hyperspace for full Linked Orders Report.   125 mg Oral Every other day 04/22/21 1440 04/22/21 1907   05/14/21  1000  vancomycin (VANCOCIN) capsule 125 mg  Status:  Discontinued       See Hyperspace for full Linked Orders Report.   125 mg Oral Daily 04/22/21 1440 04/22/21 1907   05/06/21 2200  vancomycin (VANCOCIN) capsule 125 mg  Status:  Discontinued       See Hyperspace for full Linked Orders Report.   125 mg Oral 2 times daily 04/22/21 1440 04/22/21 1907   04/23/21 1200   fidaxomicin (DIFICID) tablet 200 mg        200 mg Oral 2 times daily 04/23/21 0535 05/03/21 0959   04/22/21 2200  fidaxomicin (DIFICID) tablet 200 mg  Status:  Discontinued        200 mg Oral 2 times daily 04/22/21 1907 04/23/21 0535   04/22/21 1800  vancomycin (VANCOCIN) capsule 125 mg  Status:  Discontinued       See Hyperspace for full Linked Orders Report.   125 mg Oral 4 times daily 04/22/21 1440 04/22/21 1907       Subjective: Patient was seen and examined at bedside.  No overnight events. Patient seems much improved.  Patient is alert, arousable  and following commands.   Patient remains on Dificid therapy, stated diarrhea is improving.  Objective: Vitals:   04/26/21 2042 04/27/21 0019 04/27/21 0325 04/27/21 0731  BP:  (!) 124/58 122/88 118/61  Pulse:  87 74 85  Resp:  '17 20 20  '$ Temp:  99.2 F (37.3 C) 98.2 F (36.8 C) 97.7 F (36.5 C)  TempSrc:  Oral Oral Oral  SpO2: 98% 91% 100% 94%  Weight:        Intake/Output Summary (Last 24 hours) at 04/27/2021 1335 Last data filed at 04/27/2021 0805 Gross per 24 hour  Intake 650 ml  Output --  Net 650 ml   Filed Weights   04/21/21 1300  Weight: 64.9 kg    Examination:  General exam: Appears comfortable, not in any acute distress. Respiratory system: Clear to auscultation bilaterally, no wheezing. Cardiovascular system: S1-S2 heard, regular rate and rhythm, no murmur. Gastrointestinal system: Abdomen is soft, nontender, nondistended, BS+ Central nervous system: Alert and oriented x 2. No focal neurological deficits. Extremities: No edema, no cyanosis, no clubbing. Skin: No rashes, lesions or ulcers Psychiatry: Judgement and insight appear normal. Mood & affect appropriate.     Data Reviewed: I have personally reviewed following labs and imaging studies  CBC: Recent Labs  Lab 04/22/21 0227 04/23/21 0227 04/24/21 0341 04/26/21 0316 04/27/21 0351  WBC 8.6 9.5 9.2 9.0 6.5  HGB 9.6* 9.2* 10.2* 10.3* 8.9*  HCT  29.6* 29.2* 31.5* 32.1* 27.3*  MCV 85.5 87.2 86.3 86.1 84.5  PLT 242 254 181 174 [id number]   Basic Metabolic Panel: Recent Labs  Lab 04/21/21 0904 04/22/21 0227 04/23/21 0227 04/23/21 0321 04/24/21 0341 04/25/21 0416 04/26/21 0316 04/27/21 0351  NA  --  141 141  --  139 145 139 135  K  --  2.5* 2.9*  --  3.3* 4.2 2.7* 2.4*  CL  --  102 109  --  107 109 97* 101  CO2  --  22 25  --  '22 26 28 28  '$ GLUCOSE  --  109* 97  --  86 89 87 93  BUN  --  46* 37*  --  21 10 5* 7*  CREATININE  --  2.19* 1.13  --  0.91 1.10 0.75 0.85  CALCIUM  --  8.3* 8.6*  --  8.6* 9.0 9.0  8.0*  MG 1.6* 3.0*  --  2.6*  --   --  1.4* 1.4*  PHOS  --   --   --   --   --   --   --  2.7   GFR: Estimated Creatinine Clearance: 55.1 mL/min (by C-G formula based on SCr of 0.85 mg/dL). Liver Function Tests: Recent Labs  Lab 04/21/21 0254 04/22/21 0227 04/24/21 0341 04/25/21 0416 04/26/21 0316  AST 26 29 36 31 22  ALT '16 16 24 22 21  '$ ALKPHOS 91 63 71 70 68  BILITOT 1.6* 0.9 0.8 1.0 0.6  PROT 6.0* 5.2* 5.6* 5.9* 6.0*  ALBUMIN 2.6* 2.3* 2.5* 2.6* 2.6*   No results for input(s): LIPASE, AMYLASE in the last 168 hours. No results for input(s): AMMONIA in the last 168 hours. Coagulation Profile: No results for input(s): INR, PROTIME in the last 168 hours. Cardiac Enzymes: No results for input(s): CKTOTAL, CKMB, CKMBINDEX, TROPONINI in the last 168 hours. BNP (last 3 results) No results for input(s): PROBNP in the last 8760 hours. HbA1C: No results for input(s): HGBA1C in the last 72 hours. CBG: No results for input(s): GLUCAP in the last 168 hours. Lipid Profile: No results for input(s): CHOL, HDL, LDLCALC, TRIG, CHOLHDL, LDLDIRECT in the last 72 hours. Thyroid Function Tests: No results for input(s): TSH, T4TOTAL, FREET4, T3FREE, THYROIDAB in the last 72 hours. Anemia Panel: No results for input(s): VITAMINB12, FOLATE, FERRITIN, TIBC, IRON, RETICCTPCT in the last 72 hours. Sepsis Labs: No results for  input(s): PROCALCITON, LATICACIDVEN in the last 168 hours.  Recent Results (from the past 240 hour(s))  Resp Panel by RT-PCR (Flu A&B, Covid)     Status: None   Collection Time: 04/18/21 10:50 PM   Specimen: Nasopharyngeal(NP) swabs in vial transport medium  Result Value Ref Range Status   SARS Coronavirus 2 by RT PCR NEGATIVE NEGATIVE Final    Comment: (NOTE) SARS-CoV-2 target nucleic acids are NOT DETECTED.  The SARS-CoV-2 RNA is generally detectable in upper respiratory specimens during the acute phase of infection. The lowest concentration of SARS-CoV-2 viral copies this assay can detect is 138 copies/mL. A negative result does not preclude SARS-Cov-2 infection and should not be used as the sole basis for treatment or other patient management decisions. A negative result may occur with  improper specimen collection/handling, submission of specimen other than nasopharyngeal swab, presence of viral mutation(s) within the areas targeted by this assay, and inadequate number of viral copies(<138 copies/mL). A negative result must be combined with clinical observations, patient history, and epidemiological information. The expected result is Negative.  Fact Sheet for Patients:  EntrepreneurPulse.com.au  Fact Sheet for Healthcare Providers:  IncredibleEmployment.be  This test is no t yet approved or cleared by the [place] FDA and  has been authorized for detection and/or diagnosis of SARS-CoV-2 by FDA under an Emergency Use Authorization (EUA). This EUA will remain  in effect (meaning this test can be used) for the duration of the COVID-19 declaration under Section 564(b)(1) of the Act, 21 U.S.C.section 360bbb-3(b)(1), unless the authorization is terminated  or revoked sooner.       Influenza A by PCR NEGATIVE NEGATIVE Final   Influenza B by PCR NEGATIVE NEGATIVE Final    Comment: (NOTE) The Xpert Xpress SARS-CoV-2/FLU/RSV plus assay is  intended as an aid in the diagnosis of influenza from Nasopharyngeal swab specimens and should not be used as a sole basis for treatment. Nasal washings and aspirates are unacceptable for Xpert Xpress  SARS-CoV-2/FLU/RSV testing.  Fact Sheet for Patients: EntrepreneurPulse.com.au  Fact Sheet for Healthcare Providers: IncredibleEmployment.be  This test is not yet approved or cleared by the [place] FDA and has been authorized for detection and/or diagnosis of SARS-CoV-2 by FDA under an Emergency Use Authorization (EUA). This EUA will remain in effect (meaning this test can be used) for the duration of the COVID-19 declaration under Section 564(b)(1) of the Act, 21 U.S.C. section 360bbb-3(b)(1), unless the authorization is terminated or revoked.  Performed at [facility], [place] [address] [place], [place] [address]   Culture, blood (routine x 2)     Status: None   Collection Time: 04/21/21  9:04 AM   Specimen: Right Antecubital; Blood  Result Value Ref Range Status   Specimen Description RIGHT ANTECUBITAL  Final   Special Requests   Final    BOTTLES DRAWN AEROBIC AND ANAEROBIC Blood Culture adequate volume   Culture   Final    NO GROWTH 5 DAYS Performed at [facility], [place] [address], [place], [place] [address]    Report Status 04/26/2021 FINAL  Final  Culture, blood (routine x 2)     Status: Abnormal   Collection Time: 04/21/21  9:14 AM   Specimen: BLOOD RIGHT ARM  Result Value Ref Range Status   Specimen Description BLOOD RIGHT ARM  Final   Special Requests   Final    BOTTLES DRAWN AEROBIC AND ANAEROBIC Blood Culture adequate volume   Culture  Setup Time   Final    GRAM POSITIVE COCCI IN BOTH AEROBIC AND ANAEROBIC BOTTLES CRITICAL RESULT CALLED TO, READ BACK BY AND VERIFIED WITH: PHARMD GREG ABBOTT 04/22/21'@3'$ :36 BY TW    Culture (A)  Final    STAPHYLOCOCCUS EPIDERMIDIS STAPHYLOCOCCUS HAEMOLYTICUS THE SIGNIFICANCE OF  ISOLATING THIS ORGANISM FROM A SINGLE SET OF BLOOD CULTURES WHEN MULTIPLE SETS ARE DRAWN IS UNCERTAIN. PLEASE NOTIFY THE MICROBIOLOGY DEPARTMENT WITHIN ONE WEEK IF SPECIATION AND SENSITIVITIES ARE REQUIRED. Performed at [facility], [place] [address], [place], [place] [address]    Report Status 04/24/2021 FINAL  Final  Blood Culture ID Panel (Reflexed)     Status: Abnormal   Collection Time: 04/21/21  9:14 AM  Result Value Ref Range Status   Enterococcus faecalis NOT DETECTED NOT DETECTED Final   Enterococcus Faecium NOT DETECTED NOT DETECTED Final   Listeria monocytogenes NOT DETECTED NOT DETECTED Final   Staphylococcus species DETECTED (A) NOT DETECTED Final    Comment: CRITICAL RESULT CALLED TO, READ BACK BY AND VERIFIED WITH: PHARMD GREG ABBOTT 04/22/21'@3'$ :36 BY TW    Staphylococcus aureus (BCID) NOT DETECTED NOT DETECTED Final   Staphylococcus epidermidis DETECTED (A) NOT DETECTED Final    Comment: Methicillin (oxacillin) resistant coagulase negative staphylococcus. Possible blood culture contaminant (unless isolated from more than one blood culture draw or clinical case suggests pathogenicity). No antibiotic treatment is indicated for blood  culture contaminants. CRITICAL RESULT CALLED TO, READ BACK BY AND VERIFIED WITH: PHARMD GREG ABBOTT 04/22/21'@3'$ :36 BY TW    Staphylococcus lugdunensis NOT DETECTED NOT DETECTED Final   Streptococcus species NOT DETECTED NOT DETECTED Final   Streptococcus agalactiae NOT DETECTED NOT DETECTED Final   Streptococcus pneumoniae NOT DETECTED NOT DETECTED Final   Streptococcus pyogenes NOT DETECTED NOT DETECTED Final   A.calcoaceticus-baumannii NOT DETECTED NOT DETECTED Final   Bacteroides fragilis NOT DETECTED NOT DETECTED Final   Enterobacterales NOT DETECTED NOT DETECTED Final   Enterobacter cloacae complex NOT DETECTED NOT DETECTED Final   Escherichia  coli NOT DETECTED NOT DETECTED Final   Klebsiella aerogenes NOT DETECTED NOT DETECTED Final    Klebsiella oxytoca NOT DETECTED NOT DETECTED Final   Klebsiella pneumoniae NOT DETECTED NOT DETECTED Final   Proteus species NOT DETECTED NOT DETECTED Final   Salmonella species NOT DETECTED NOT DETECTED Final   Serratia marcescens NOT DETECTED NOT DETECTED Final   Haemophilus influenzae NOT DETECTED NOT DETECTED Final   Neisseria meningitidis NOT DETECTED NOT DETECTED Final   Pseudomonas aeruginosa NOT DETECTED NOT DETECTED Final   Stenotrophomonas maltophilia NOT DETECTED NOT DETECTED Final   Candida albicans NOT DETECTED NOT DETECTED Final   Candida auris NOT DETECTED NOT DETECTED Final   Candida glabrata NOT DETECTED NOT DETECTED Final   Candida krusei NOT DETECTED NOT DETECTED Final   Candida parapsilosis NOT DETECTED NOT DETECTED Final   Candida tropicalis NOT DETECTED NOT DETECTED Final   Cryptococcus neoformans/gattii NOT DETECTED NOT DETECTED Final   Methicillin resistance mecA/C DETECTED (A) NOT DETECTED Final    Comment: CRITICAL RESULT CALLED TO, READ BACK BY AND VERIFIED WITH: PHARMD GREG ABBOTT 04/22/21'@3'$ :36 BY TW Performed at [facility], [address], [place], [place] [address]   C Difficile Quick Screen (NO PCR Reflex)     Status: Abnormal   Collection Time: 04/21/21 12:00 PM   Specimen: Urine, Clean Catch; Stool  Result Value Ref Range Status   C Diff antigen POSITIVE (A) NEGATIVE Final   C Diff toxin NEGATIVE NEGATIVE Final   C Diff interpretation   Final    Results are indeterminate. Please contact the provider listed for your campus for C diff questions in [place].    Comment: Performed at [facility], [place] [address], [place], [place] [address]  C. Diff by PCR, Reflexed     Status: Abnormal   Collection Time: 04/21/21 12:00 PM  Result Value Ref Range Status   Toxigenic C. Difficile by PCR POSITIVE (A) NEGATIVE Final    Comment: Positive for toxigenic C. difficile with little to no toxin production. Only treat if clinical presentation suggests  symptomatic illness. Performed at [facility], [place] [address] [place], [place] [address]     Radiology Studies: No results found.  Scheduled Meds:  enoxaparin (LOVENOX) injection  40 mg Subcutaneous Q24H   fidaxomicin  200 mg Oral BID   mometasone-formoterol  2 puff Inhalation BID   potassium chloride  40 mEq Oral Once   potassium chloride  40 mEq Oral Once   QUEtiapine  25 mg Oral QHS   tamsulosin  0.4 mg Oral QHS   Continuous Infusions:  lactated ringers 125 mL/hr at 04/27/21 0327     LOS: 7 days    Time spent: 25 mins    Bishop Vanderwerf, MD [facility]   If 7PM-7AM, please contact night-coverage

## 2021-04-27 NOTE — TOC Progression Note (Signed)
Transition of Care [facility]) - Progression Note    Patient Details  Name: Leon Estes MRN: [id number] Date of Birth: 1930/10/28  Transition [facility]) CM/SW [place], [place] Phone Number: 04/27/2021, 2:51 PM  Clinical Narrative:   CSW met with patient, patient's wife, and patient's daughter at bedside to discuss SNF vs HH. Wife and daughter want to take the patient home. CSW discussed DME, and patient has WC, RW, 3N1, and lift chair at home. No hospital bed, but daughter has one at her home that can be brought over. Wife indicated that she didn't feel like she'd need one for the patient. CSW asked about home health, and wife also not interested in home health right now, said she'd like to see how he is when he's ready to go home. Patient has a nurse that comes to see him with [facility], through his insurance. CSW to follow and check back with wife at discharge for any other needs.    Expected Discharge Plan: Home/Self Care Barriers to Discharge: Continued Medical Work up  Expected Discharge Plan and Services Expected Discharge Plan: Home/Self Care   Discharge Planning Services: CM Consult   Living arrangements for the past 2 months: Single Family Home                                       Social Determinants of Health (SDOH) Interventions    Readmission Risk Interventions No flowsheet data found.

## 2021-04-28 LAB — BASIC METABOLIC PANEL
Anion gap: 9 (ref 5–15)
BUN: 6 mg/dL — ABNORMAL LOW (ref 8–23)
CO2: 26 mmol/L (ref 22–32)
Calcium: 8.1 mg/dL — ABNORMAL LOW (ref 8.9–10.3)
Chloride: 100 mmol/L (ref 98–111)
Creatinine, Ser: 0.79 mg/dL (ref 0.61–1.24)
GFR, Estimated: >60 mL/min (ref >60)
Glucose, Bld: 92 mg/dL (ref 70–99)
Potassium: 3 mmol/L — ABNORMAL LOW (ref 3.5–5.1)
Sodium: 135 mmol/L (ref 135–145)

## 2021-04-28 LAB — CBC
HCT: 27.4 % — ABNORMAL LOW (ref 39.0–52.0)
Hemoglobin: 8.8 g/dL — ABNORMAL LOW (ref 13.0–17.0)
MCH: 27.5 pg (ref 26.0–34.0)
MCHC: 32.1 g/dL (ref 30.0–36.0)
MCV: 85.6 fL (ref 80.0–100.0)
Platelets: 222 10*3/uL (ref 150–400)
RBC: 3.2 MIL/uL — ABNORMAL LOW (ref 4.22–5.81)
RDW: 18.8 % — ABNORMAL HIGH (ref 11.5–15.5)
WBC: 8.3 10*3/uL (ref 4.0–10.5)
nRBC: 0 % (ref 0.0–0.2)

## 2021-04-28 LAB — PHOSPHORUS: Phosphorus: 2.4 mg/dL — ABNORMAL LOW (ref 2.5–4.6)

## 2021-04-28 LAB — MAGNESIUM: Magnesium: 1.6 mg/dL — ABNORMAL LOW (ref 1.7–2.4)

## 2021-04-28 MED ORDER — MAGNESIUM SULFATE 2 GM/50ML IV SOLN
2.0000 g | Freq: Once | INTRAVENOUS | Status: AC
  Administered 2021-04-28: 2 g via INTRAVENOUS
  Filled 2021-04-28: qty 50

## 2021-04-28 MED ORDER — POTASSIUM PHOSPHATES 15 MMOLE/5ML IV SOLN
30.0000 mmol | Freq: Once | INTRAVENOUS | Status: AC
  Administered 2021-04-28 (×2): 30 mmol via INTRAVENOUS
  Filled 2021-04-28: qty 10

## 2021-04-28 MED ORDER — POTASSIUM CHLORIDE 20 MEQ PO PACK
40.0000 meq | PACK | Freq: Once | ORAL | Status: AC
  Administered 2021-04-28: 40 meq via ORAL
  Filled 2021-04-28: qty 2

## 2021-04-28 NOTE — Evaluation (Signed)
Clinical/Bedside Swallow Evaluation Patient Details  Name: Leon Estes MRN: [id number] Date of Birth: Jul 15, 1931  Today's Date: 04/28/2021 Time: SLP Start Time (ACUTE ONLY): 70 SLP Stop Time (ACUTE ONLY): 72 SLP Time Calculation (min) (ACUTE ONLY): 16 min  Past Medical History:  Past Medical History:  Diagnosis Date   Asthma    BPH (benign prostatic hypertrophy)    Cataract    Chronic renal disease, stage III (HCC)    Chronic sinusitis    COPD (chronic obstructive pulmonary disease) (HCC)    Coronary artery disease    2 stents   Gall stones    Hearing loss    DOES NOT WEAR HEARING AIDS   High cholesterol    Hypertension    Macular degeneration    right eye   Past Surgical History:  Past Surgical History:  Procedure Laterality Date   APPENDECTOMY     CORONARY ANGIOPLASTY WITH STENT PLACEMENT  1998   2 stents   TRANSURETHRAL RESECTION OF PROSTATE     HPI:  Pt is an 87 y.o. male admitted on 04/18/21 for L sided weakness, confusion, aphasia.  Pt dx with encepholopathy of unknown origin, medical workup continues. MRI stable (shows Schwannoma, but stable compared to last scan). Chest xray significant for subtle reticulonodular opacity within the right upper lobe suspicious for developing infection. PMH: cataract, COPD, CAD, hearing loss, HTN, macular degeneration. BSE (01/08/21) significant for cognitive based dysphagia, no overt s/sx of aspiration; dys 3, thin diet recommended at last treatment (01/12/21) post eval.    Assessment / Plan / Recommendation  Clinical Impression  Pt seen for bedside swallow evaluation with sitter present reporting pt pocketing food during pm meal. OT further reports that pt had decreased awareness to this presentation despite cues. Pt alert and pleasantly confused, engaging in simple conversation with SLP. Oral mechanism examination unremarkable, however it should be noted that pt is edentulous. Thin liquids via consecutive straw sips resulted in x1  overt cough, with quick recovery, across multiple trials. Regular textured solid significant for prolonged mastication and suspect due largely to lack of dentition. Oral clearance of all solid POs was complete and no overt s/sx of aspiration were noted. Given edentulous status and possible cognitive component impacting swallow function, recommend dysphagia 2, thin liquid diet. SLP to f/u for tolerance.  SLP Visit Diagnosis: Dysphagia, unspecified (R13.10)    Aspiration Risk  Mild aspiration risk    Diet Recommendation Dysphagia 2 (Fine chop);Thin liquid   Liquid Administration via: Cup;Straw Medication Administration: Crushed with puree Supervision: Patient able to self feed;Full supervision/cueing for compensatory strategies Compensations: Minimize environmental distractions;Slow rate;Small sips/bites;Follow solids with liquid Postural Changes: Seated upright at 90 degrees    Other  Recommendations Oral Care Recommendations: Oral care BID;Staff/trained caregiver to provide oral care    Recommendations for follow up therapy are one component of a multi-disciplinary discharge planning process, led by the attending physician.  Recommendations may be updated based on patient status, additional functional criteria and insurance authorization.  Follow up Recommendations Other (comment) (TBD)      Frequency and Duration min 2x/week  2 weeks       Prognosis Prognosis for Safe Diet Advancement: Good Barriers to Reach Goals: Cognitive deficits      Swallow Study   General Date of Onset: 04/28/21 HPI: Pt is an 87 y.o. male admitted on 04/18/21 for L sided weakness, confusion, aphasia.  Pt dx with encepholopathy of unknown origin, medical workup continues. MRI stable (shows Schwannoma, but stable  compared to last scan). Chest xray significant for subtle reticulonodular opacity within the right upper lobe suspicious for developing infection. PMH: cataract, COPD, CAD, hearing loss, HTN, macular  degeneration. BSE (01/08/21) significant for cognitive based dysphagia, no overt s/sx of aspiration; dys 3, thin diet recommended at last treatment (01/12/21) post eval. Type of Study: Bedside Swallow Evaluation Previous Swallow Assessment: see HPI Diet Prior to this Study: Dysphagia 3 (soft);Thin liquids Temperature Spikes Noted: Yes (100) Respiratory Status: Room air History of Recent Intubation: No Behavior/Cognition: Alert;Cooperative;Pleasant mood;Confused Oral Cavity Assessment: Within Functional Limits Oral Care Completed by SLP: No Oral Cavity - Dentition: Edentulous Vision: Functional for self-feeding Self-Feeding Abilities: Able to feed self Patient Positioning: Upright in bed;Postural control adequate for testing Baseline Vocal Quality: Normal Volitional Cough: Strong    Oral/Motor/Sensory Function Overall Oral Motor/Sensory Function: Within functional limits   Ice Chips Ice chips: Not tested   Thin Liquid Thin Liquid: Within functional limits Presentation: Straw;Self Fed Other Comments:  (overt cough x1)    Nectar Thick Nectar Thick Liquid: Not tested   Honey Thick Honey Thick Liquid: Not tested   Puree Puree: Within functional limits Presentation: Self Fed;Spoon   Solid     Solid: Impaired Presentation: Self Fed Oral Phase Impairments: Impaired mastication Oral Phase Functional Implications: Impaired mastication     [place] [facility], [place], [place] Office Number: [phone]  Leon Estes 04/28/2021,3:40 PM

## 2021-04-28 NOTE — Progress Notes (Signed)
Pt received in supine, agreeable to therapy session and with good participation and tolerance for bed mobility, transfer training, seated/standing exercises and short gait trial at bedside. Pt performed mobility tasks with up to +2 minA (mostly for safety due to cognitive deficit/impulsivity and decreased awareness of lines), pt calm/cooperative throughout). Emphasis on safety awareness/fall risk prevention, safe hand placement with transfers, improved step length/height and safe RW use. Pt continues to benefit from PT services to progress toward functional mobility goals. Continue to recommend SNF, if pt/family instead choose home, recommend HHPT for home safety assessment due to high fall risk and poor safety awareness.   04/28/21 1400  PT Visit Information  Last PT Received On 04/28/21  Assistance Needed +2  PT/OT/SLP Co-Evaluation/Treatment Yes  Reason for Co-Treatment Complexity of the patient's impairments (multi-system involvement);Necessary to address cognition/behavior during functional activity;For patient/therapist safety;To address functional/ADL transfers  PT goals addressed during session Mobility/safety with mobility;Balance;Proper use of DME;Strengthening/ROM  History of Present Illness Pt is an 87 y.o. male admitted on 04/18/21 for L sided weakness, confusion, aphasia.  Pt dx with encepholopathy of unknown origin, medical workup continues.  MRI stable (shows Schwannoma, but stable compared to last scan).  Pt with signiciant PMH of cataract, COPD, CAD, hearing loss, HTN, macular degeneration.  Subjective Data  Patient Stated Goal pt unable  Precautions  Precautions Fall  Precaution Comments has been known to kick and hit (per RN) this admission; pocketing food 9/16 (RN/MD notified)  Restrictions  Weight Bearing Restrictions No  Pain Assessment  Pain Assessment Faces  Faces Pain Scale 0  Pain Location no c/o today  Pain Intervention(s) Limited activity within patient's  tolerance;Monitored during session;Repositioned  Cognition  Arousal/Alertness Awake/alert  Behavior During Therapy Flat affect  Overall Cognitive Status Impaired/Different from baseline  Area of Impairment Safety/judgement;Problem solving;Awareness;Following commands;Attention  Current Attention Level Sustained  Memory Decreased short-term memory  Following Commands Follows one step commands inconsistently;Follows one step commands with increased time  Safety/Judgement Decreased awareness of safety;Decreased awareness of deficits  Awareness Intellectual  Problem Solving Slow processing;Decreased initiation;Difficulty sequencing;Requires verbal cues;Requires tactile cues  General Comments decreased safety awareness, needs cues for hand placement, pt following 1-step cues but decreased carryover of instruction between transfers; pt noted to be pocketing food while eating after getting up to chair/RN and MD notified he would benefit from SLP consult  Bed Mobility  Overal bed mobility Needs Assistance  Bed Mobility Supine to Sit  Supine to sit Min guard  General bed mobility comments cues for safety/sequencing  Transfers  Overall transfer level Needs assistance  Equipment used Rolling walker (2 wheeled)  Transfers Sit to/from [employer] Transfers  Sit to Stand Min assist;+2 safety/equipment  Stand pivot transfers +2 physical assistance;Min assist  General transfer comment pt needs cues for hand placement, decreased carryover of safety/sequencing cues between transfers. shuffled, nearly festinating gait and impulsive to sit.  Ambulation/Gait  Ambulation/Gait assistance Min assist;+2 safety/equipment  Gait Distance (Feet) 8 Feet  Assistive device Rolling walker (2 wheeled)  Gait Pattern/deviations Step-to pattern;Festinating;Trunk flexed;Narrow base of support;Decreased dorsiflexion - right;Decreased dorsiflexion - left;Shuffle  General Gait Details cues for posture, proximity to RW,  forward gaze, improved step length/height. Pt performs standing hip flexion x10 reps but does better with visual cues for improved step height; pivotal steps to recliner, then forward/backward steps in room  Gait velocity interpretation <1.8 ft/sec, indicate of risk for recurrent falls  Balance  Overall balance assessment Needs assistance  Sitting-balance support Feet supported;No upper extremity supported  Sitting balance-Leahy Scale Poor  Postural control Right lateral lean  Standing balance support Bilateral upper extremity supported  Standing balance-Leahy Scale Poor  Standing balance comment heavy reliance on BUE support on RW  General Comments  General comments (skin integrity, edema, etc.) sitter in room with pt at end of session and chair alarm activated; pt set up to eat but noted to be pocketing food in cheek -MD/RN notified  Exercises  Exercises Other exercises  Other Exercises  Other Exercises STS x 3 reps;  Other Exercises Standing BLE AROM: hip flexion x10 reps ea  Other Exercises Seated BLE AROM: hip flexion, LAQ, ankle pumps x10 reps ea  PT - End of Session  Equipment Utilized During Treatment Gait belt  Activity Tolerance Patient tolerated treatment well  Patient left in chair;with call bell/phone within reach;with chair alarm set;with nursing/sitter in room  Nurse Communication Mobility status;Other (comment) (may need SLP eval, pt edentulous and pocketing food in cheek)   PT - Assessment/Plan  PT Plan Current plan remains appropriate  PT Visit Diagnosis Muscle weakness (generalized) (M62.81);Difficulty in walking, not elsewhere classified (R26.2)  PT Frequency (ACUTE ONLY) Min 2X/week  Follow Up Recommendations SNF  PT equipment Hospital bed;Wheelchair cushion (measurements PT);Wheelchair (measurements PT)  AM-PAC PT "6 Clicks" Mobility Outcome Measure (Version 2)  Help needed turning from your back to your side while in a flat bed without using bedrails? 3  Help  needed moving from lying on your back to sitting on the side of a flat bed without using bedrails? 3  Help needed moving to and from a bed to a chair (including a wheelchair)? 3  Help needed standing up from a chair using your arms (e.g., wheelchair or bedside chair)? 3  Help needed to walk in hospital room? 2  Help needed climbing 3-5 steps with a railing?  1  6 Click Score 15  Consider Recommendation of Discharge To: CIR/SNF/LTACH  Progressive Mobility  What is the highest level of mobility based on the progressive mobility assessment? Level 4 (Walks with assist in room) - Balance while marching in place and cannot step forward and back - Complete  Mobility Ambulated with assistance in room;Out of bed to chair with meals  PT Goal Progression  Progress towards PT goals Progressing toward goals  Acute Rehab PT Goals  PT Goal Formulation With family  Time For Goal Achievement 05/04/21  Potential to Achieve Goals Fair  PT Time Calculation  PT Start Time (ACUTE ONLY) 1313  PT Stop Time (ACUTE ONLY) 1336  PT Time Calculation (min) (ACUTE ONLY) 23 min  PT General Charges  $$ ACUTE PT VISIT 1 Visit  PT Treatments  $Therapeutic Exercise 8-22 mins

## 2021-04-28 NOTE — Plan of Care (Signed)

## 2021-04-28 NOTE — Progress Notes (Signed)
PROGRESS NOTE    Leon Estes  [id number] DOB: 1931-02-25 DOA: 04/18/2021 PCP: Venia Carbon, MD   Brief Narrative:  This 87 year old Male with history of COPD, CAD, Schwanoma, HTN, HLD, severe visual loss, HOH, recent acute on chronic cholecystitis, and recurrent [name] episodes who was brought to the ER after he developed altered mental status/confusion.  In the ED MRI ruled out the possibility of an acute CVA.  Patient found to have a [name] and started on Dificid for 10 days.  Assessment & Plan:   Principal Problem:   Acute metabolic encephalopathy Active Problems:   Benign essential HTN   Macular degeneration, wet (HCC)   HOH (hard of hearing)   Recurrent Clostridioides difficile diarrhea   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)   Cholecystitis   Toxic metabolic encephalopathy   Acute toxic metabolic encephalopathy : Patient presented with altered mentation. MRI brain unremarkable, TSH normal.  UA normal.  ammonia and B12 normal. Ultrasound abdomen negative for acute cholecystitis. Neurology consulted . concerns for possible underlying dementia, recommendation for outpatient neuropsych testing. Continue trial of Seroquel, continue to hold gabapentin. Mental status waxes and wanes. Patient appears much improved. He seems at his baseline. Continue gentle IV hydration. Speech and swallow evaluation,  concern for patient aspirating food.   Diarrhea secondary to [name], recurrent: Patient continues to have multiple loose watery stools resulting in electrolyte abnormalities. Patient does have a history of [name] in May 2022. Patient recently had leukocytosis around 30 K which is improving. Continue IV hydration. C. difficile testing was initially inconclusive, confirmatory test now positive for [name] Initially patient continued on oral vancomycin for first  recurrence. Given past history of severe [name] which resulted in severe sepsis with endorgan failure, now on dificid.  Plan for 10 days course of Dificid therapy. Stool output has improved since he started Dificid. Stool culture pending.   Dehydration > Improved. Likely related to diarrhea.  Much improved.   Acute kidney injury > Resolved. Cr has normalized with IVF. Continue IV hydration.  Hypokalemia, hypomagnesemia: Replacement in progress, recheck in a.m.   Hypertension: Blood pressure is stable.  COPD Seems to be compensated at this time.   Schwannoma No evidence of an acute complicating issue   Severe visual loss and hearing loss.  Stable.     DVT prophylaxis: Lovenox. Code Status:  DNR Family Communication:No family at bed side. Disposition Plan:   Status is: Inpatient  Remains inpatient appropriate because:Inpatient level of care appropriate due to severity of illness  Dispo: The patient is from: Home              Anticipated d/c is to: SNF              Patient currently is not medically stable to d/c.   Difficult to place patient No  Consultants:   None  Procedures: None  Antimicrobials:   Anti-infectives (From admission, onward)    Start     Dose/Rate Route Frequency Ordered Stop   06/18/21 1000  vancomycin (VANCOCIN) capsule 125 mg  Status:  Discontinued       See Hyperspace for full Linked Orders Report.   125 mg Oral Every 3 DAYS 04/22/21 1440 04/22/21 1907   05/21/21 1000  vancomycin (VANCOCIN) capsule 125 mg  Status:  Discontinued       See Hyperspace for full Linked Orders Report.   125 mg  Oral Every other day 04/22/21 1440 04/22/21 1907   05/14/21 1000  vancomycin (VANCOCIN) capsule 125 mg  Status:  Discontinued       See Hyperspace for full Linked Orders Report.   125 mg Oral Daily 04/22/21 1440 04/22/21 1907   05/06/21 2200  vancomycin (VANCOCIN) capsule 125 mg  Status:  Discontinued       See Hyperspace for full Linked  Orders Report.   125 mg Oral 2 times daily 04/22/21 1440 04/22/21 1907   04/23/21 1200  fidaxomicin (DIFICID) tablet 200 mg        200 mg Oral 2 times daily 04/23/21 0535 05/03/21 0959   04/22/21 2200  fidaxomicin (DIFICID) tablet 200 mg  Status:  Discontinued        200 mg Oral 2 times daily 04/22/21 1907 04/23/21 0535   04/22/21 1800  vancomycin (VANCOCIN) capsule 125 mg  Status:  Discontinued       See Hyperspace for full Linked Orders Report.   125 mg Oral 4 times daily 04/22/21 1440 04/22/21 1907       Subjective: Patient was seen and examined at bedside.  No overnight events. Patient seems much improved.  He is alert , oriented x 2 and following commands. Patient remains on Dificid therapy, stated diarrhea is improving.  Objective: Vitals:   04/28/21 0026 04/28/21 0423 04/28/21 0750 04/28/21 1217  BP: (!) 108/46 137/70 125/74 119/67  Pulse: 86 91 86 79  Resp: '17 17 20 '$ (!) 22  Temp: 98.2 F (36.8 C) 98.2 F (36.8 C) 100 F (37.8 C) 98.2 F (36.8 C)  TempSrc: Oral Oral Oral Oral  SpO2: 98% 98% 96% 100%  Weight:       No intake or output data in the 24 hours ending 04/28/21 1441  Filed Weights   04/21/21 1300  Weight: 64.9 kg    Examination:  General exam: Appears comfortable, deconditioned, not in any acute distress. Respiratory system: Clear to auscultation bilaterally, no wheezing. Cardiovascular system: S1-S2 heard, regular rate and rhythm, no murmur. Gastrointestinal system: Abdomen is soft, nontender, nondistended, BS+ Central nervous system: Alert and oriented x 2. No focal neurological deficits. Extremities: No edema, no cyanosis, no clubbing. Skin: No rashes, lesions or ulcers Psychiatry: Judgement and insight appear normal. Mood & affect appropriate.     Data Reviewed: I have personally reviewed following labs and imaging studies  CBC: Recent Labs  Lab 04/23/21 0227 04/24/21 0341 04/26/21 0316 04/27/21 0351 04/28/21 0333  WBC 9.5 9.2 9.0 6.5  8.3  HGB 9.2* 10.2* 10.3* 8.9* 8.8*  HCT 29.2* 31.5* 32.1* 27.3* 27.4*  MCV 87.2 86.3 86.1 84.5 85.6  PLT 254 181 174 229 [id number]   Basic Metabolic Panel: Recent Labs  Lab 04/22/21 0227 04/23/21 0227 04/23/21 0321 04/24/21 0341 04/25/21 0416 04/26/21 0316 04/27/21 0351 04/28/21 0333  NA 141   < >  --  139 145 139 135 135  K 2.5*   < >  --  3.3* 4.2 2.7* 2.4* 3.0*  CL 102   < >  --  107 109 97* 101 100  CO2 22   < >  --  '22 26 28 28 26  '$ GLUCOSE 109*   < >  --  86 89 87 93 92  BUN 46*   < >  --  21 10 5* 7* 6*  CREATININE 2.19*   < >  --  0.91 1.10 0.75 0.85 0.79  CALCIUM 8.3*   < >  --  8.6* 9.0 9.0 8.0* 8.1*  MG 3.0*  --  2.6*  --   --  1.4* 1.4* 1.6*  PHOS  --   --   --   --   --   --  2.7 2.4*   < > = values in this interval not displayed.   GFR: Estimated Creatinine Clearance: 58.6 mL/min (by C-G formula based on SCr of 0.79 mg/dL). Liver Function Tests: Recent Labs  Lab 04/22/21 0227 04/24/21 0341 04/25/21 0416 04/26/21 0316  AST 29 36 31 22  ALT '16 24 22 21  '$ ALKPHOS 63 71 70 68  BILITOT 0.9 0.8 1.0 0.6  PROT 5.2* 5.6* 5.9* 6.0*  ALBUMIN 2.3* 2.5* 2.6* 2.6*   No results for input(s): LIPASE, AMYLASE in the last 168 hours. No results for input(s): AMMONIA in the last 168 hours. Coagulation Profile: No results for input(s): INR, PROTIME in the last 168 hours. Cardiac Enzymes: No results for input(s): CKTOTAL, CKMB, CKMBINDEX, TROPONINI in the last 168 hours. BNP (last 3 results) No results for input(s): PROBNP in the last 8760 hours. HbA1C: No results for input(s): HGBA1C in the last 72 hours. CBG: No results for input(s): GLUCAP in the last 168 hours. Lipid Profile: No results for input(s): CHOL, HDL, LDLCALC, TRIG, CHOLHDL, LDLDIRECT in the last 72 hours. Thyroid Function Tests: No results for input(s): TSH, T4TOTAL, FREET4, T3FREE, THYROIDAB in the last 72 hours. Anemia Panel: No results for input(s): VITAMINB12, FOLATE, FERRITIN, TIBC, IRON, RETICCTPCT  in the last 72 hours. Sepsis Labs: No results for input(s): PROCALCITON, LATICACIDVEN in the last 168 hours.  Recent Results (from the past 240 hour(s))  Resp Panel by RT-PCR (Flu A&B, Covid)     Status: None   Collection Time: 04/18/21 10:50 PM   Specimen: Nasopharyngeal(NP) swabs in vial transport medium  Result Value Ref Range Status   SARS Coronavirus 2 by RT PCR NEGATIVE NEGATIVE Final    Comment: (NOTE) SARS-CoV-2 target nucleic acids are NOT DETECTED.  The SARS-CoV-2 RNA is generally detectable in upper respiratory specimens during the acute phase of infection. The lowest concentration of SARS-CoV-2 viral copies this assay can detect is 138 copies/mL. A negative result does not preclude SARS-Cov-2 infection and should not be used as the sole basis for treatment or other patient management decisions. A negative result may occur with  improper specimen collection/handling, submission of specimen other than nasopharyngeal swab, presence of viral mutation(s) within the areas targeted by this assay, and inadequate number of viral copies(<138 copies/mL). A negative result must be combined with clinical observations, patient history, and epidemiological information. The expected result is Negative.  Fact Sheet for Patients:  EntrepreneurPulse.com.au  Fact Sheet for Healthcare Providers:  IncredibleEmployment.be  This test is no t yet approved or cleared by the [place] FDA and  has been authorized for detection and/or diagnosis of SARS-CoV-2 by FDA under an Emergency Use Authorization (EUA). This EUA will remain  in effect (meaning this test can be used) for the duration of the COVID-19 declaration under Section 564(b)(1) of the Act, 21 U.S.C.section 360bbb-3(b)(1), unless the authorization is terminated  or revoked sooner.       Influenza A by PCR NEGATIVE NEGATIVE Final   Influenza B by PCR NEGATIVE NEGATIVE Final    Comment:  (NOTE) The Xpert Xpress SARS-CoV-2/FLU/RSV plus assay is intended as an aid in the diagnosis of influenza from Nasopharyngeal swab specimens and should not be used as a sole basis for treatment. Nasal washings and aspirates  are unacceptable for Xpert Xpress SARS-CoV-2/FLU/RSV testing.  Fact Sheet for Patients: EntrepreneurPulse.com.au  Fact Sheet for Healthcare Providers: IncredibleEmployment.be  This test is not yet approved or cleared by the [place] FDA and has been authorized for detection and/or diagnosis of SARS-CoV-2 by FDA under an Emergency Use Authorization (EUA). This EUA will remain in effect (meaning this test can be used) for the duration of the COVID-19 declaration under Section 564(b)(1) of the Act, 21 U.S.C. section 360bbb-3(b)(1), unless the authorization is terminated or revoked.  Performed at [facility], [place] [address] [place], [place] [address]   Culture, blood (routine x 2)     Status: None   Collection Time: 04/21/21  9:04 AM   Specimen: Right Antecubital; Blood  Result Value Ref Range Status   Specimen Description RIGHT ANTECUBITAL  Final   Special Requests   Final    BOTTLES DRAWN AEROBIC AND ANAEROBIC Blood Culture adequate volume   Culture   Final    NO GROWTH 5 DAYS Performed at [facility], [place] [address] [place], [place] [address]    Report Status 04/26/2021 FINAL  Final  Culture, blood (routine x 2)     Status: Abnormal   Collection Time: 04/21/21  9:14 AM   Specimen: BLOOD RIGHT ARM  Result Value Ref Range Status   Specimen Description BLOOD RIGHT ARM  Final   Special Requests   Final    BOTTLES DRAWN AEROBIC AND ANAEROBIC Blood Culture adequate volume   Culture  Setup Time   Final    GRAM POSITIVE COCCI IN BOTH AEROBIC AND ANAEROBIC BOTTLES CRITICAL RESULT CALLED TO, READ BACK BY AND VERIFIED WITH: PHARMD GREG ABBOTT 04/22/21'@3'$ :36 BY TW    Culture (A)  Final    STAPHYLOCOCCUS  EPIDERMIDIS STAPHYLOCOCCUS HAEMOLYTICUS THE SIGNIFICANCE OF ISOLATING THIS ORGANISM FROM A SINGLE SET OF BLOOD CULTURES WHEN MULTIPLE SETS ARE DRAWN IS UNCERTAIN. PLEASE NOTIFY THE MICROBIOLOGY DEPARTMENT WITHIN ONE WEEK IF SPECIATION AND SENSITIVITIES ARE REQUIRED. Performed at [facility], [place] [address] [place], [place] [address]    Report Status 04/24/2021 FINAL  Final  Blood Culture ID Panel (Reflexed)     Status: Abnormal   Collection Time: 04/21/21  9:14 AM  Result Value Ref Range Status   Enterococcus faecalis NOT DETECTED NOT DETECTED Final   Enterococcus Faecium NOT DETECTED NOT DETECTED Final   Listeria monocytogenes NOT DETECTED NOT DETECTED Final   Staphylococcus species DETECTED (A) NOT DETECTED Final    Comment: CRITICAL RESULT CALLED TO, READ BACK BY AND VERIFIED WITH: PHARMD GREG ABBOTT 04/22/21'@3'$ :36 BY TW    Staphylococcus aureus (BCID) NOT DETECTED NOT DETECTED Final   Staphylococcus epidermidis DETECTED (A) NOT DETECTED Final    Comment: Methicillin (oxacillin) resistant coagulase negative staphylococcus. Possible blood culture contaminant (unless isolated from more than one blood culture draw or clinical case suggests pathogenicity). No antibiotic treatment is indicated for blood  culture contaminants. CRITICAL RESULT CALLED TO, READ BACK BY AND VERIFIED WITH: PHARMD GREG ABBOTT 04/22/21'@3'$ :36 BY TW    Staphylococcus lugdunensis NOT DETECTED NOT DETECTED Final   Streptococcus species NOT DETECTED NOT DETECTED Final   Streptococcus agalactiae NOT DETECTED NOT DETECTED Final   Streptococcus pneumoniae NOT DETECTED NOT DETECTED Final   Streptococcus pyogenes NOT DETECTED NOT DETECTED Final   A.calcoaceticus-baumannii NOT DETECTED NOT DETECTED Final   Bacteroides fragilis NOT DETECTED NOT DETECTED Final   Enterobacterales NOT DETECTED NOT DETECTED Final   Enterobacter cloacae complex NOT DETECTED NOT  DETECTED Final   Escherichia coli NOT DETECTED NOT DETECTED  Final   Klebsiella aerogenes NOT DETECTED NOT DETECTED Final   Klebsiella oxytoca NOT DETECTED NOT DETECTED Final   Klebsiella pneumoniae NOT DETECTED NOT DETECTED Final   Proteus species NOT DETECTED NOT DETECTED Final   Salmonella species NOT DETECTED NOT DETECTED Final   Serratia marcescens NOT DETECTED NOT DETECTED Final   Haemophilus influenzae NOT DETECTED NOT DETECTED Final   Neisseria meningitidis NOT DETECTED NOT DETECTED Final   Pseudomonas aeruginosa NOT DETECTED NOT DETECTED Final   Stenotrophomonas maltophilia NOT DETECTED NOT DETECTED Final   Candida albicans NOT DETECTED NOT DETECTED Final   Candida auris NOT DETECTED NOT DETECTED Final   Candida glabrata NOT DETECTED NOT DETECTED Final   Candida krusei NOT DETECTED NOT DETECTED Final   Candida parapsilosis NOT DETECTED NOT DETECTED Final   Candida tropicalis NOT DETECTED NOT DETECTED Final   Cryptococcus neoformans/gattii NOT DETECTED NOT DETECTED Final   Methicillin resistance mecA/C DETECTED (A) NOT DETECTED Final    Comment: CRITICAL RESULT CALLED TO, READ BACK BY AND VERIFIED WITH: PHARMD GREG ABBOTT 04/22/21'@3'$ :36 BY TW Performed at [facility], [address] [place], [place] [address]   C Difficile Quick Screen (NO PCR Reflex)     Status: Abnormal   Collection Time: 04/21/21 12:00 PM   Specimen: Urine, Clean Catch; Stool  Result Value Ref Range Status   C Diff antigen POSITIVE (A) NEGATIVE Final   C Diff toxin NEGATIVE NEGATIVE Final   C Diff interpretation   Final    Results are indeterminate. Please contact the provider listed for your campus for C diff questions in [place].    Comment: Performed at [facility], [place] [address] [place], [place] [address]  C. Diff by PCR, Reflexed     Status: Abnormal   Collection Time: 04/21/21 12:00 PM  Result Value Ref Range Status   Toxigenic C. Difficile by PCR POSITIVE (A) NEGATIVE Final    Comment: Positive for toxigenic C. difficile with little to no  toxin production. Only treat if clinical presentation suggests symptomatic illness. Performed at [facility], [place] [address], [place], [place] [address]     Radiology Studies: No results found.  Scheduled Meds:  enoxaparin (LOVENOX) injection  40 mg Subcutaneous Q24H   fidaxomicin  200 mg Oral BID   mometasone-formoterol  2 puff Inhalation BID   potassium chloride  40 mEq Oral Once   QUEtiapine  25 mg Oral QHS   tamsulosin  0.4 mg Oral QHS   Continuous Infusions:  lactated ringers 125 mL/hr at 04/28/21 0645   potassium PHOSPHATE IVPB (in mmol)       LOS: 8 days    Time spent: 25 mins    Shawna Clamp, MD [facility]   If 7PM-7AM, please contact night-coverage

## 2021-04-28 NOTE — Progress Notes (Signed)
Occupational Therapy Treatment Patient Details Name: Leon Estes MRN: [id number] DOB: 13-Mar-1931 Today's Date: 04/28/2021   History of present illness Pt is an 87 y.o. male admitted on 04/18/21 for L sided weakness, confusion, aphasia.  Pt dx with encepholopathy of unknown origin, medical workup continues.  MRI stable (shows Schwannoma, but stable compared to last scan).  Pt with signiciant PMH of cataract, COPD, CAD, hearing loss, HTN, macular degeneration.   OT comments  Pt received in bed, agreeable to OT session. Pt currently requires minguard for bed mobility and minA+2 for functional mobility at RW level. Pt demonstrated improvement with attention to R side of environment. He demonstrates improvement with sequencing, however continues to require cues. Pt requiring supervision for self-feeding and noted to pocket food in R cheek. RN/MD notified. Pt will continue to benefit from skilled OT services to maximize safety and independence with ADL/IADL and functional mobility. Will continue to follow acutely and progress as tolerated.     Recommendations for follow up therapy are one component of a multi-disciplinary discharge planning process, led by the attending physician.  Recommendations may be updated based on patient status, additional functional criteria and insurance authorization.    Follow Up Recommendations  SNF;Supervision/Assistance - 24 hour    Equipment Recommendations  Other (comment) (TBD at next venue)    Recommendations for Other Services      Precautions / Restrictions Precautions Precautions: Fall Precaution Comments: has been known to kick and hit (per RN) this admission; pocketing food 9/16 (RN/MD notified) Restrictions Weight Bearing Restrictions: No       Mobility Bed Mobility Overal bed mobility: Needs Assistance Bed Mobility: Supine to Sit     Supine to sit: Min guard     General bed mobility comments: cues for safety/sequencing     Transfers Overall transfer level: Needs assistance Equipment used: Rolling walker (2 wheeled) Transfers: Sit to/from [facility] Sit to Stand: Min assist;+2 safety/equipment Stand pivot transfers: +2 physical assistance;Min assist       General transfer comment: pt needs cues for hand placement, decreased carryover of safety/sequencing cues between transfers. shuffled, nearly festinating gait and impulsive to sit.    Balance Overall balance assessment: Needs assistance Sitting-balance support: Feet supported;No upper extremity supported Sitting balance-Leahy Scale: Poor   Postural control: Right lateral lean Standing balance support: Bilateral upper extremity supported Standing balance-Leahy Scale: Poor Standing balance comment: heavy reliance on BUE support on RW                           ADL either performed or assessed with clinical judgement   ADL Overall ADL's : Needs assistance/impaired Eating/Feeding: Supervision/ safety Eating/Feeding Details (indicate cue type and reason): pt requiring supervision during eating due to pocketing food and occassionally not scooping food item, pt with hx of macular degeneration                 Lower Body Dressing: Maximal assistance   Toilet Transfer: Minimal assistance;Ambulation;+2 for safety/equipment;Cueing for sequencing Toilet Transfer Details (indicate cue type and reason): minA with simulation this date, cues for sequencing, pt with improved attention to R side of environment         Functional mobility during ADLs: Minimal assistance;Rolling walker General ADL Comments: limited by cognition, decreased activity tolerance, generalized weakness     Vision   Additional Comments: hx of macular degneration, pt reports decreased ability to see his lunch tray and food items on it  Perception     Praxis      Cognition Arousal/Alertness: Awake/alert Behavior During Therapy: Flat  affect Overall Cognitive Status: Impaired/Different from baseline Area of Impairment: Safety/judgement;Problem solving;Awareness;Following commands;Attention;Orientation                 Orientation Level: Disoriented to;Place;Time;Situation Current Attention Level: Sustained Memory: Decreased short-term memory Following Commands: Follows one step commands inconsistently;Follows one step commands with increased time Safety/Judgement: Decreased awareness of safety;Decreased awareness of deficits Awareness: Intellectual Problem Solving: Slow processing;Decreased initiation;Difficulty sequencing;Requires verbal cues;Requires tactile cues General Comments: decreased safety awareness, needs cues for hand placement, pt following 1-step cues but decreased carryover of instruction between transfers; pt noted to be pocketing food while eating after getting up to chair/RN and MD notified he would benefit from SLP consult        Exercises     Shoulder Instructions       General Comments sitter in room with pt at end of session    Pertinent Vitals/ Pain       Pain Assessment: No/denies pain Faces Pain Scale: No hurt Pain Location: no c/o today Pain Intervention(s): Monitored during session  Home Living                                          Prior Functioning/Environment              Frequency  Min 2X/week        Progress Toward Goals  OT Goals(current goals can now be found in the care plan section)  Progress towards OT goals: Progressing toward goals  Acute Rehab OT Goals Patient Stated Goal: pt did not state OT Goal Formulation: With patient Time For Goal Achievement: 05/05/21 Potential to Achieve Goals: Fair ADL Goals Pt Will Perform Grooming: sitting;with min guard assist Pt Will Perform Upper Body Bathing: with set-up Pt Will Transfer to Toilet: with min guard assist;ambulating Pt Will Perform Toileting - Clothing Manipulation and hygiene:  with min guard assist;sit to/from stand Additional ADL Goal #1: Pt will complete bed mobility at modified independent level in preparation for ADL. Additional ADL Goal #2: Pt will consistently follow 1-2 step commands. Additional ADL Goal #3: Pt will demonstrate emerergent awareness for safe engagement in ADL.  Plan Discharge plan remains appropriate    Co-evaluation    PT/OT/SLP Co-Evaluation/Treatment: Yes Reason for Co-Treatment: Complexity of the patient's impairments (multi-system involvement);To address functional/ADL transfers;For patient/therapist safety PT goals addressed during session: Mobility/safety with mobility;Balance;Proper use of DME;Strengthening/ROM OT goals addressed during session: ADL's and self-care      AM-PAC OT "6 Clicks" Daily Activity     Outcome Measure   Help from another person eating meals?: A Little Help from another person taking care of personal grooming?: A Little Help from another person toileting, which includes using toliet, bedpan, or urinal?: A Little Help from another person bathing (including washing, rinsing, drying)?: A Lot Help from another person to put on and taking off regular upper body clothing?: A Little Help from another person to put on and taking off regular lower body clothing?: A Lot 6 Click Score: 16    End of Session Equipment Utilized During Treatment: Gait belt;Rolling walker  OT Visit Diagnosis: Other abnormalities of gait and mobility (R26.89);Other symptoms and signs involving cognitive function;Low vision, both eyes (H54.2)   Activity Tolerance Patient tolerated treatment well   Patient Left with  call bell/phone within reach;in chair;with chair alarm set;with nursing/sitter in room   Nurse Communication Mobility status        Time: [id number] OT Time Calculation (min): 31 min  Charges: OT General Charges $OT Visit: 1 Visit OT Treatments $Self Care/Home Management : 8-22 mins  Helene Kelp OTR/L Acute  Rehabilitation Services Office: [place] 04/28/2021, 3:19 PM

## 2021-04-29 ENCOUNTER — Inpatient Hospital Stay (HOSPITAL_COMMUNITY): Payer: PPO

## 2021-04-29 LAB — BASIC METABOLIC PANEL
Anion gap: 8 (ref 5–15)
BUN: 7 mg/dL — ABNORMAL LOW (ref 8–23)
CO2: 24 mmol/L (ref 22–32)
Calcium: 8.1 mg/dL — ABNORMAL LOW (ref 8.9–10.3)
Chloride: 105 mmol/L (ref 98–111)
Creatinine, Ser: 0.82 mg/dL (ref 0.61–1.24)
GFR, Estimated: >60 mL/min (ref >60)
Glucose, Bld: 106 mg/dL — ABNORMAL HIGH (ref 70–99)
Potassium: 3.7 mmol/L (ref 3.5–5.1)
Sodium: 137 mmol/L (ref 135–145)

## 2021-04-29 LAB — CBC
HCT: 28.3 % — ABNORMAL LOW (ref 39.0–52.0)
Hemoglobin: 9.1 g/dL — ABNORMAL LOW (ref 13.0–17.0)
MCH: 27.7 pg (ref 26.0–34.0)
MCHC: 32.2 g/dL (ref 30.0–36.0)
MCV: 86.3 fL (ref 80.0–100.0)
Platelets: 206 10*3/uL (ref 150–400)
RBC: 3.28 MIL/uL — ABNORMAL LOW (ref 4.22–5.81)
RDW: 19 % — ABNORMAL HIGH (ref 11.5–15.5)
WBC: 8 10*3/uL (ref 4.0–10.5)
nRBC: 0 % (ref 0.0–0.2)

## 2021-04-29 LAB — PHOSPHORUS: Phosphorus: 4.7 mg/dL — ABNORMAL HIGH (ref 2.5–4.6)

## 2021-04-29 LAB — MAGNESIUM: Magnesium: 1.8 mg/dL (ref 1.7–2.4)

## 2021-04-29 NOTE — Progress Notes (Signed)
PROGRESS NOTE    AWESOME LYNG  [id number] DOB: [birth year] DOA: 04/18/2021 PCP: Venia Carbon, MD   Brief Narrative:  This 87 year old Male with history of COPD, CAD, Schwanoma, HTN, HLD, severe visual loss, HOH, recent acute on chronic cholecystitis, and recurrent [name] episodes who was brought to the ER after he developed altered mental status/confusion.  In the ED MRI ruled out the possibility of an acute CVA.  Patient found to have a [name] and started on Dificid for 10 days.  Assessment & Plan:   Principal Problem:   Acute metabolic encephalopathy Active Problems:   Benign essential HTN   Macular degeneration, wet (HCC)   HOH (hard of hearing)   Recurrent Clostridioides difficile diarrhea   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)   Cholecystitis   Toxic metabolic encephalopathy   Acute toxic metabolic encephalopathy : Patient presented with altered mentation. MRI brain unremarkable, TSH normal.  UA normal.  ammonia and B12 normal. Ultrasound abdomen negative for acute cholecystitis. Neurology consulted . concerns for possible underlying dementia, recommendation for outpatient neuropsych testing. Continue Seroquel, continue to hold gabapentin. Mental status waxes and wanes. Patient appears much improved. He seems at his baseline. Continue gentle IV hydration. Speech and swallow evaluation,  concern for patient aspirating food. Speech recommended dysphagia 2 diet thin liquids.   Diarrhea secondary to [name], recurrent: Patient continues to have multiple loose watery stools resulting in electrolyte abnormalities. Patient does have a history of [name] in May 2022. Patient recently had leukocytosis around 30 K which is improving. Continue IV hydration. C. difficile testing was initially inconclusive, confirmatory test now positive for [name] Initially patient continued on  oral vancomycin for first recurrence. Given past history of severe [name] which resulted in severe sepsis with endorgan failure, now on dificid.  Plan for 10 days course of Dificid therapy. Stool output has improved since he started Dificid. GI panel pending.    Dehydration > Improved. Likely related to diarrhea.  Much improved.   Acute kidney injury > Resolved. Cr has normalized with IVF. Continue IV hydration.  Hypokalemia, hypomagnesemia: Replaced and improved.   Hypertension: Blood pressure is stable.  COPD Seems to be compensated at this time.   Schwannoma No evidence of an acute complicating issue   Severe visual loss and hearing loss.  Stable.     DVT prophylaxis: Lovenox. Code Status:  DNR Family Communication:No family at bed side. Disposition Plan:   Status is: Inpatient  Remains inpatient appropriate because:Inpatient level of care appropriate due to severity of illness  Dispo: The patient is from: Home              Anticipated d/c is to: SNF              Patient currently is not medically stable to d/c.   Difficult to place patient No  Consultants:   None  Procedures: None  Antimicrobials:   Anti-infectives (From admission, onward)    Start     Dose/Rate Route Frequency Ordered Stop   06/18/21 1000  vancomycin (VANCOCIN) capsule 125 mg  Status:  Discontinued       See Hyperspace for full Linked Orders Report.   125 mg Oral Every 3 DAYS 04/22/21 1440 04/22/21 1907   05/21/21 1000  vancomycin (VANCOCIN) capsule 125 mg  Status:  Discontinued       See Hyperspace for full Linked Orders Report.  125 mg Oral Every other day 04/22/21 1440 04/22/21 1907   05/14/21 1000  vancomycin (VANCOCIN) capsule 125 mg  Status:  Discontinued       See Hyperspace for full Linked Orders Report.   125 mg Oral Daily 04/22/21 1440 04/22/21 1907   05/06/21 2200  vancomycin (VANCOCIN) capsule 125 mg  Status:  Discontinued       See Hyperspace for full  Linked Orders Report.   125 mg Oral 2 times daily 04/22/21 1440 04/22/21 1907   04/23/21 1200  fidaxomicin (DIFICID) tablet 200 mg        200 mg Oral 2 times daily 04/23/21 0535 05/03/21 0959   04/22/21 2200  fidaxomicin (DIFICID) tablet 200 mg  Status:  Discontinued        200 mg Oral 2 times daily 04/22/21 1907 04/23/21 0535   04/22/21 1800  vancomycin (VANCOCIN) capsule 125 mg  Status:  Discontinued       See Hyperspace for full Linked Orders Report.   125 mg Oral 4 times daily 04/22/21 1440 04/22/21 1907       Subjective: Patient was seen and examined at bedside.  No overnight events. Patient seems much improved.  He is alert oriented x3 and following commands. Patient still reports having slightly loose stools but  is getting softer. Patient remains on Dificid therapy, stated diarrhea is improving. Family declined SNF,  wants patient to be discharged home with home services.  Objective: Vitals:   04/29/21 0329 04/29/21 0746 04/29/21 0846 04/29/21 1209  BP: 119/65  (!) 107/48 133/81  Pulse: 85  86 91  Resp: '16  16 20  '$ Temp: 98 F (36.7 C)  98.2 F (36.8 C) 98 F (36.7 C)  TempSrc: Oral  Oral Oral  SpO2: 95% 95%  98%  Weight:        Intake/Output Summary (Last 24 hours) at 04/29/2021 1416 Last data filed at 04/29/2021 0947 Gross per 24 hour  Intake 1630.86 ml  Output 2150 ml  Net -519.14 ml    Filed Weights   04/21/21 1300  Weight: 64.9 kg    Examination:  General exam: Appears comfortable, deconditioned, not in any acute distress.   Respiratory system: Clear to auscultation bilaterally, no wheezing. Cardiovascular system: S1-S2 heard, regular rate and rhythm, no murmur. Gastrointestinal system: Abdomen is soft, nontender, nondistended, BS+ Central nervous system: Alert and oriented x 2. No focal neurological deficits. Extremities: No edema, no cyanosis, no clubbing. Skin: No rashes, lesions or ulcers Psychiatry: Mood and affect appropriate.    Data  Reviewed: I have personally reviewed following labs and imaging studies  CBC: Recent Labs  Lab 04/24/21 0341 04/26/21 0316 04/27/21 0351 04/28/21 0333 04/29/21 0225  WBC 9.2 9.0 6.5 8.3 8.0  HGB 10.2* 10.3* 8.9* 8.8* 9.1*  HCT 31.5* 32.1* 27.3* 27.4* 28.3*  MCV 86.3 86.1 84.5 85.6 86.3  PLT 181 174 229 222 [id number]   Basic Metabolic Panel: Recent Labs  Lab 04/23/21 0321 04/24/21 0341 04/25/21 0416 04/26/21 0316 04/27/21 0351 04/28/21 0333 04/29/21 0225  NA  --    < > 145 139 135 135 137  K  --    < > 4.2 2.7* 2.4* 3.0* 3.7  CL  --    < > 109 97* 101 100 105  CO2  --    < > '26 28 28 26 24  '$ GLUCOSE  --    < > 89 87 93 92 106*  BUN  --    < >  10 5* 7* 6* 7*  CREATININE  --    < > 1.10 0.75 0.85 0.79 0.82  CALCIUM  --    < > 9.0 9.0 8.0* 8.1* 8.1*  MG 2.6*  --   --  1.4* 1.4* 1.6* 1.8  PHOS  --   --   --   --  2.7 2.4* 4.7*   < > = values in this interval not displayed.   GFR: Estimated Creatinine Clearance: 57.2 mL/min (by C-G formula based on SCr of 0.82 mg/dL). Liver Function Tests: Recent Labs  Lab 04/24/21 0341 04/25/21 0416 04/26/21 0316  AST 36 31 22  ALT '24 22 21  '$ ALKPHOS 71 70 68  BILITOT 0.8 1.0 0.6  PROT 5.6* 5.9* 6.0*  ALBUMIN 2.5* 2.6* 2.6*   No results for input(s): LIPASE, AMYLASE in the last 168 hours. No results for input(s): AMMONIA in the last 168 hours. Coagulation Profile: No results for input(s): INR, PROTIME in the last 168 hours. Cardiac Enzymes: No results for input(s): CKTOTAL, CKMB, CKMBINDEX, TROPONINI in the last 168 hours. BNP (last 3 results) No results for input(s): PROBNP in the last 8760 hours. HbA1C: No results for input(s): HGBA1C in the last 72 hours. CBG: No results for input(s): GLUCAP in the last 168 hours. Lipid Profile: No results for input(s): CHOL, HDL, LDLCALC, TRIG, CHOLHDL, LDLDIRECT in the last 72 hours. Thyroid Function Tests: No results for input(s): TSH, T4TOTAL, FREET4, T3FREE, THYROIDAB in the last 72  hours. Anemia Panel: No results for input(s): VITAMINB12, FOLATE, FERRITIN, TIBC, IRON, RETICCTPCT in the last 72 hours. Sepsis Labs: No results for input(s): PROCALCITON, LATICACIDVEN in the last 168 hours.  Recent Results (from the past 240 hour(s))  Culture, blood (routine x 2)     Status: None   Collection Time: 04/21/21  9:04 AM   Specimen: Right Antecubital; Blood  Result Value Ref Range Status   Specimen Description RIGHT ANTECUBITAL  Final   Special Requests   Final    BOTTLES DRAWN AEROBIC AND ANAEROBIC Blood Culture adequate volume   Culture   Final    NO GROWTH 5 DAYS Performed at [facility], [address] [place], [place] [address]    Report Status 04/26/2021 FINAL  Final  Culture, blood (routine x 2)     Status: Abnormal   Collection Time: 04/21/21  9:14 AM   Specimen: BLOOD RIGHT ARM  Result Value Ref Range Status   Specimen Description BLOOD RIGHT ARM  Final   Special Requests   Final    BOTTLES DRAWN AEROBIC AND ANAEROBIC Blood Culture adequate volume   Culture  Setup Time   Final    GRAM POSITIVE COCCI IN BOTH AEROBIC AND ANAEROBIC BOTTLES CRITICAL RESULT CALLED TO, READ BACK BY AND VERIFIED WITH: PHARMD GREG ABBOTT 04/22/21'@3'$ :36 BY TW    Culture (A)  Final    STAPHYLOCOCCUS EPIDERMIDIS STAPHYLOCOCCUS HAEMOLYTICUS THE SIGNIFICANCE OF ISOLATING THIS ORGANISM FROM A SINGLE SET OF BLOOD CULTURES WHEN MULTIPLE SETS ARE DRAWN IS UNCERTAIN. PLEASE NOTIFY THE MICROBIOLOGY DEPARTMENT WITHIN ONE WEEK IF SPECIATION AND SENSITIVITIES ARE REQUIRED. Performed at [facility], [place] [address], [place], [place] [address]    Report Status 04/24/2021 FINAL  Final  Blood Culture ID Panel (Reflexed)     Status: Abnormal   Collection Time: 04/21/21  9:14 AM  Result Value Ref Range Status   Enterococcus faecalis NOT DETECTED NOT DETECTED Final   Enterococcus Faecium NOT DETECTED NOT DETECTED Final  Listeria monocytogenes NOT DETECTED NOT DETECTED Final    Staphylococcus species DETECTED (A) NOT DETECTED Final    Comment: CRITICAL RESULT CALLED TO, READ BACK BY AND VERIFIED WITH: PHARMD GREG ABBOTT 04/22/21'@3'$ :36 BY TW    Staphylococcus aureus (BCID) NOT DETECTED NOT DETECTED Final   Staphylococcus epidermidis DETECTED (A) NOT DETECTED Final    Comment: Methicillin (oxacillin) resistant coagulase negative staphylococcus. Possible blood culture contaminant (unless isolated from more than one blood culture draw or clinical case suggests pathogenicity). No antibiotic treatment is indicated for blood  culture contaminants. CRITICAL RESULT CALLED TO, READ BACK BY AND VERIFIED WITH: PHARMD GREG ABBOTT 04/22/21'@3'$ :36 BY TW    Staphylococcus lugdunensis NOT DETECTED NOT DETECTED Final   Streptococcus species NOT DETECTED NOT DETECTED Final   Streptococcus agalactiae NOT DETECTED NOT DETECTED Final   Streptococcus pneumoniae NOT DETECTED NOT DETECTED Final   Streptococcus pyogenes NOT DETECTED NOT DETECTED Final   A.calcoaceticus-baumannii NOT DETECTED NOT DETECTED Final   Bacteroides fragilis NOT DETECTED NOT DETECTED Final   Enterobacterales NOT DETECTED NOT DETECTED Final   Enterobacter cloacae complex NOT DETECTED NOT DETECTED Final   Escherichia coli NOT DETECTED NOT DETECTED Final   Klebsiella aerogenes NOT DETECTED NOT DETECTED Final   Klebsiella oxytoca NOT DETECTED NOT DETECTED Final   Klebsiella pneumoniae NOT DETECTED NOT DETECTED Final   Proteus species NOT DETECTED NOT DETECTED Final   Salmonella species NOT DETECTED NOT DETECTED Final   Serratia marcescens NOT DETECTED NOT DETECTED Final   Haemophilus influenzae NOT DETECTED NOT DETECTED Final   Neisseria meningitidis NOT DETECTED NOT DETECTED Final   Pseudomonas aeruginosa NOT DETECTED NOT DETECTED Final   Stenotrophomonas maltophilia NOT DETECTED NOT DETECTED Final   Candida albicans NOT DETECTED NOT DETECTED Final   Candida auris NOT DETECTED NOT DETECTED Final   Candida glabrata  NOT DETECTED NOT DETECTED Final   Candida krusei NOT DETECTED NOT DETECTED Final   Candida parapsilosis NOT DETECTED NOT DETECTED Final   Candida tropicalis NOT DETECTED NOT DETECTED Final   Cryptococcus neoformans/gattii NOT DETECTED NOT DETECTED Final   Methicillin resistance mecA/C DETECTED (A) NOT DETECTED Final    Comment: CRITICAL RESULT CALLED TO, READ BACK BY AND VERIFIED WITH: PHARMD GREG ABBOTT 04/22/21'@3'$ :36 BY TW Performed at [facility], [address], [place], [place] [address]   C Difficile Quick Screen (NO PCR Reflex)     Status: Abnormal   Collection Time: 04/21/21 12:00 PM   Specimen: Urine, Clean Catch; Stool  Result Value Ref Range Status   C Diff antigen POSITIVE (A) NEGATIVE Final   C Diff toxin NEGATIVE NEGATIVE Final   C Diff interpretation   Final    Results are indeterminate. Please contact the provider listed for your campus for C diff questions in [place].    Comment: Performed at [facility], [place] [address], [place], [place] [address]  C. Diff by PCR, Reflexed     Status: Abnormal   Collection Time: 04/21/21 12:00 PM  Result Value Ref Range Status   Toxigenic C. Difficile by PCR POSITIVE (A) NEGATIVE Final    Comment: Positive for toxigenic C. difficile with little to no toxin production. Only treat if clinical presentation suggests symptomatic illness. Performed at [facility], [place] [address], [place], [place] [address]     Radiology Studies: DG CHEST PORT 1 VIEW  Result Date: 04/29/2021 CLINICAL DATA:  Short of breath.  Pneumonia.  Pleural effusions. EXAM: PORTABLE CHEST 1 VIEW COMPARISON:  04/21/2021 FINDINGS: Stable large cardiac silhouette. Increase in patchy bilateral airspace densities in lower lobes. Increased vascular congestion in the upper lobes. No pneumothorax. IMPRESSION: 1. Increasing bibasilar airspace disease concerning for edema versus pneumonia. 2. Increased central venous congestion the upper lobes. Electronically Signed    By: Suzy Bouchard M.D.   On: 04/29/2021 11:10    Scheduled Meds:  enoxaparin (LOVENOX) injection  40 mg Subcutaneous Q24H   fidaxomicin  200 mg Oral BID   mometasone-formoterol  2 puff Inhalation BID   potassium chloride  40 mEq Oral Once   QUEtiapine  25 mg Oral QHS   tamsulosin  0.4 mg Oral QHS   Continuous Infusions:  lactated ringers 125 mL/hr at 04/29/21 0325     LOS: 9 days    Time spent: 25 mins    Elfrida Pixley, MD [facility]   If 7PM-7AM, please contact night-coverage

## 2021-04-30 LAB — GASTROINTESTINAL PANEL BY PCR, STOOL (REPLACES STOOL CULTURE)

## 2021-04-30 MED ORDER — FIDAXOMICIN 200 MG PO TABS: 200.0000 mg | ORAL_TABLET | Freq: Two times a day (BID) | ORAL | 5 refills | Status: AC

## 2021-04-30 MED ORDER — QUETIAPINE FUMARATE 25 MG PO TABS: 25.0000 mg | ORAL_TABLET | Freq: Every day | ORAL | 0 refills | Status: DC

## 2021-04-30 NOTE — Plan of Care (Signed)
  Problem: Education: Goal: Knowledge of General Education information will improve Description: Including pain rating scale, medication(s)/side effects and non-pharmacologic comfort measures 04/30/2021 1321 by Shelton Silvas, RN Outcome: Adequate for Discharge 04/30/2021 1317 by Shelton Silvas, RN Outcome: Adequate for Discharge   Problem: Health Behavior/Discharge Planning: Goal: Ability to manage health-related needs will improve 04/30/2021 1321 by Shelton Silvas, RN Outcome: Adequate for Discharge 04/30/2021 1317 by Shelton Silvas, RN Outcome: Adequate for Discharge   Problem: Clinical Measurements: Goal: Ability to maintain clinical measurements within normal limits will improve 04/30/2021 1321 by Shelton Silvas, RN Outcome: Adequate for Discharge 04/30/2021 1317 by Shelton Silvas, RN Outcome: Adequate for Discharge Goal: Will remain free from infection 04/30/2021 1321 by Shelton Silvas, RN Outcome: Adequate for Discharge 04/30/2021 1317 by Shelton Silvas, RN Outcome: Adequate for Discharge Goal: Diagnostic test results will improve 04/30/2021 1321 by Shelton Silvas, RN Outcome: Adequate for Discharge 04/30/2021 1317 by Shelton Silvas, RN Outcome: Adequate for Discharge Goal: Respiratory complications will improve 04/30/2021 1321 by Shelton Silvas, RN Outcome: Adequate for Discharge 04/30/2021 1317 by Shelton Silvas, RN Outcome: Adequate for Discharge Goal: Cardiovascular complication will be avoided 04/30/2021 1321 by Shelton Silvas, RN Outcome: Adequate for Discharge 04/30/2021 1317 by Shelton Silvas, RN Outcome: Adequate for Discharge

## 2021-04-30 NOTE — Discharge Summary (Signed)
Physician Discharge Summary  TILL LOUPE [id number] DOB: 1931/02/03 DOA: 04/18/2021  PCP: Venia Carbon, MD  Admit date: 04/18/2021  Discharge date: 04/30/2021  Admitted From:  Home. Disposition:  Home Health Services.  Recommendations for Outpatient Follow-up:  Follow up with PCP in 1-2 weeks. Please obtain BMP/CBC in one week. Advised to complete Dificid therapy twice daily for 2 more days to complete 10 day treatment for [name].  Home Health: Home Health PT/OT. Equipment/Devices: None  Discharge Condition: Stable CODE STATUS:DNR Diet recommendation: Thin liquids  Brief Summary / Hospital Course: This 87 year old Male with PMH significant of COPD, CAD, Schwanoma, HTN, HLD, severe visual loss, HOH, recent acute on chronic cholecystitis, and recurrent [name] episodes who was brought to the ER after he developed altered mental status/confusion.  In the ED MRI ruled out the possibility of an acute CVA.  Patient found to have a [name] and started on Dificid for 10 days. Patient was admitted for acute metabolic encephalopathy and [name].  Patient continued to have ongoing diarrhea which slowly improved over the course of hospitalization.  Patient altered mentation has resolved.  Patient felt better.  Leukocytosis has improved.  Patient remains afebrile.  Diarrhea has significantly improved.  Patient has tolerated dysphagia 3 diet.  Patient has ambulated in the hallway with walker and 1 person.  Home health services been arranged.  Patient is being discharged home.  Patient's wife and patient's sister they are at bedside and they are providing full-time support at home.  Patient is being discharged home.  He was managed for below problems.   Discharge Diagnoses:  Principal Problem:   Acute metabolic encephalopathy Active Problems:   Benign essential HTN   Macular degeneration, wet (HCC)   HOH (hard of hearing)   Recurrent  Clostridioides difficile diarrhea   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)   Cholecystitis   Toxic metabolic encephalopathy  Acute toxic metabolic encephalopathy : Patient presented with altered mentation. MRI brain unremarkable, TSH normal.  UA normal.  ammonia and B12 normal. Ultrasound abdomen negative for acute cholecystitis. Neurology consulted . concerns for possible underlying dementia, recommendation for outpatient neuropsych testing. Continue Seroquel, continue to hold gabapentin. Mental status waxes and wanes. Patient appears much improved. He seems at his baseline. Speech and swallow evaluation,  concern for patient aspirating food. Speech recommended dysphagia 2 diet thin liquids.   Diarrhea secondary to [name], recurrent: Patient continues to have multiple loose watery stools resulting in electrolyte abnormalities. Patient does have a history of [name] in May 2022. Patient recently had leukocytosis around 30 K which is improving. C. difficile testing was initially inconclusive, confirmatory test now positive for [name] Initially patient continued on oral vancomycin for first recurrence. Given past history of severe [name] which resulted in severe sepsis with endorgan failure, now on dificid.  Plan for 10 days course of Dificid therapy. Stool output has improved since he started Dificid.    Dehydration > Improved. Likely related to diarrhea.  Much improved.   Acute kidney injury > Resolved. Cr has normalized with IVF. Continue IV hydration.   Hypokalemia, hypomagnesemia: Replaced and improved.   Hypertension: Blood pressure is stable.   COPD Seems to be compensated at this time.   Schwannoma No evidence of an acute complicating issue   Severe visual loss and hearing loss.  Stable.    Discharge Instructions  Discharge Instructions  Call MD for:  difficulty breathing,  headache or visual disturbances   Complete by: As directed    Call MD for:  persistant dizziness or light-headedness   Complete by: As directed    Call MD for:  persistant nausea and vomiting   Complete by: As directed    Diet - low sodium heart healthy   Complete by: As directed    Diet general   Complete by: As directed    Discharge instructions   Complete by: As directed    Advised to follow up PCP in one week. Advised to complete Dificid twice daily for 2 more days to complete 10 day treatment for Co. Difficile colitis.   Increase activity slowly   Complete by: As directed    Increase activity slowly   Complete by: As directed       Allergies as of 04/30/2021       Reactions   Sulfa Antibiotics Rash   Benzalkonium Chloride    "Benzalkonium chloride is a quaternary ammonium antiseptic and disinfectant with actions and uses similar to those of other cationic surfactants. It is also used as an antimicrobial preservative for pharmaceutical products."   Neosporin [neomycin-bacitracin Zn-polymyx]    Augmentin [amoxicillin-pot Clavulanate] Rash   Terbinafine Rash   Terbinafine And Related Rash        Medication List     TAKE these medications    acetaminophen 500 MG tablet Commonly known as: TYLENOL Take 1,000 mg by mouth every 6 (six) hours as needed for moderate pain or headache.   aspirin EC 81 MG tablet Take 81 mg by mouth daily.   budesonide-formoterol 160-4.5 MCG/ACT inhaler Commonly known as: SYMBICORT INHALE 2 PUFFS INTO THE LUNGS TWICE DAILY What changed: when to take this   fidaxomicin 200 MG Tabs tablet Commonly known as: DIFICID Take 1 tablet (200 mg total) by mouth 2 (two) times daily for 5 doses.   gabapentin 300 MG capsule Commonly known as: NEURONTIN Take 300 mg by mouth at bedtime as needed (pain).   multivitamin with minerals Tabs tablet Take 1 tablet by mouth daily.   pantoprazole 40 MG tablet Commonly known as: PROTONIX TAKE 1 TABLET BY  MOUTH ONCE A DAY   potassium chloride 10 MEQ tablet Commonly known as: KLOR-CON Take 1 tablet (10 mEq total) by mouth daily.   QUEtiapine 25 MG tablet Commonly known as: SEROQUEL Take 1 tablet (25 mg total) by mouth at bedtime.   simvastatin 40 MG tablet Commonly known as: ZOCOR Take 40 mg by mouth every evening.   tamsulosin 0.4 MG Caps capsule Commonly known as: FLOMAX TAKE 1 CAPSULE BY MOUTH ONCE DAILY What changed: when to take this   traMADol 50 MG tablet Commonly known as: ULTRAM Take 50 mg by mouth 2 (two) times daily as needed for moderate pain.        Follow-up Information     Viviana Simpler I, MD Follow up in 1 week(s).   Specialties: Internal Medicine, Pediatrics Contact information: [place] [address] [phone]         Nahser, Wonda Cheng, MD .   Specialty: Cardiology Contact information: [address] [place] [address] [phone]                Allergies  Allergen Reactions   Sulfa Antibiotics Rash   Benzalkonium Chloride     "Benzalkonium chloride is a quaternary ammonium antiseptic and disinfectant with actions and uses similar  to those of other cationic surfactants. It is also used as an antimicrobial preservative for pharmaceutical products."   Neosporin [Neomycin-Bacitracin Zn-Polymyx]    Augmentin [Amoxicillin-Pot Clavulanate] Rash   Terbinafine Rash   Terbinafine And Related Rash    Consultations: None   Procedures/Studies: DG Chest 1 View  Result Date: 04/19/2021 CLINICAL DATA:  Altered mental status. EXAM: CHEST  1 VIEW COMPARISON:  Chest x-ray 04/03/2021. FINDINGS: Examination is technically limited secondary to patient positioning. Left costophrenic angle has been excluded from the examination. There is no definite lung consolidation, pleural effusion or pneumothorax. The cardiomediastinal silhouette is grossly unchanged. Heart is mildly enlarged. No acute fractures are seen.  IMPRESSION: 1. Technically limited study secondary to patient positioning. No definite acute cardiopulmonary process. Electronically Signed   By: Ronney Asters M.D.   On: 04/19/2021 00:02   CT HEAD WO CONTRAST (5MM)  Result Date: 04/03/2021 CLINICAL DATA:  Stabbing pain in the left temporal region which was episodic, currently resolved. EXAM: CT HEAD WITHOUT CONTRAST TECHNIQUE: Contiguous axial images were obtained from the base of the skull through the vertex without intravenous contrast. COMPARISON:  None. FINDINGS: Brain: There is a low and intermediate density mass in the CP angle region on the left most consistent with an epidermoid. This measures approximately 3.7 x 2.1 x 3.1 cm and has some mass-effect upon the brainstem and left cerebellum. No sign of hemorrhage in that region. Cerebral hemispheres show mild age related volume loss with mild small vessel change of the white matter. No evidence of supratentorial mass, hemorrhage, hydrocephalus or extra-axial collection. Vascular: There is atherosclerotic calcification of the major vessels at the base of the brain. Skull: Negative Sinuses/Orbits: Scattered mucosal disease in the ethmoid regions. Probable polyposis of the nasal passages. Orbits negative. Cannot rule out some encroachment upon the nasal lacrimal duct on the left by polyp disease. 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 This does have some mass-effect upon the brain. MRI of the brain and temporal bones with and without contrast recommended. Mild age related atrophy and mild chronic small-vessel ischemic change of the cerebral hemispheric white matter. Probable polyposis of the nasal passageways. Electronically Signed   By: Nelson Chimes M.D.   On: 04/03/2021 07:19   MR BRAIN WO CONTRAST  Result Date: 04/18/2021 CLINICAL DATA:  Initial evaluation for neuro deficit, stroke suspected. EXAM: MRI HEAD WITHOUT  CONTRAST TECHNIQUE: Multiplanar, multiecho pulse sequences of the brain and surrounding structures were obtained without intravenous contrast. COMPARISON:  Prior studies from earlier the same day as well as recent MRI from 04/03/2021. FINDINGS: Brain: Age-related cerebral atrophy with chronic microvascular ischemic disease. Remote lacunar type infarct within the posterior right corona radiata. No abnormal foci of restricted diffusion to suggest acute or subacute ischemia. Gray-white matter differentiation maintained. No encephalomalacia to suggest chronic cortical infarction. No acute intracranial hemorrhage. Multifocal areas of scattered chronic hemosiderin staining noted involving both cerebral hemispheres, suggesting prior subarachnoid hemorrhage, stable from prior. Partially solid/partially cystic mass positioned at the left CP angle cistern again noted, stable. Mild mass effect on the adjacent left brainstem and cerebellum without significant vasogenic edema, stable. No other mass lesion or mass effect. No midline shift. Stable ventricular size without hydrocephalus. No extra-axial fluid collection. Pituitary gland and suprasellar region within normal limits. Midline structures intact. Vascular: Major intracranial vascular flow voids are maintained. Skull and upper cervical spine: Craniocervical junction within normal limits. Bone  marrow signal intensity normal. No scalp soft tissue abnormality. Sinuses/Orbits: Prior ocular lens replacement on the right. Globes and orbital soft tissues demonstrate no acute finding. Scattered mucosal thickening noted within the ethmoidal air cells. Possible sinonasal polyposis again noted. Small right mastoid effusion noted, of doubtful significance. Other: None. IMPRESSION: 1. No acute intracranial abnormality. 2. Stable partially solid/partially cystic mass at the left CP angle cistern with localized mass effect. 3. Age-related cerebral atrophy with chronic microvascular  ischemic disease. 4. Scattered chronic hemosiderin staining involving both cerebral hemispheres, suggesting prior subarachnoid hemorrhage, stable. Electronically Signed   By: Jeannine Boga M.D.   On: 04/18/2021 23:08   MR Brain W and Wo Contrast  Result Date: 04/03/2021 CLINICAL DATA:  Brain mass or lesion; mass EXAM: MRI HEAD WITHOUT AND WITH CONTRAST TECHNIQUE: Multiplanar, multiecho pulse sequences of the brain and surrounding structures were obtained without and with intravenous contrast. CONTRAST:  10m GADAVIST GADOBUTROL 1 MMOL/ML IV SOLN COMPARISON:  CT head from the same day. FINDINGS: Brain: No acute infarction, hemorrhage, hydrocephalus, or extra-axial fluid collection. Generalized atrophy with ex vacuo ventricular dilation. Mild for age scattered T2 hyperintensities in the white matter, likely related to chronic microvascular ischemic disease. Small remote infarct in the right frontoparietal white matter. Dedicated IAC protocol was performed. There is a cystic and solid extra-axial mass centered at the left cerebellopontine angle which measures approximately 3.6 x 2.2 x 2.9 cm (AP by transverse by craniocaudal). The solid portions of the mass are enhancing. The mass does not restrict diffusion. No intrinsic T1 hyperintensity. The mass abuts, but does not extend into the IAC. The mass abuts the left pons, middle cerebellar peduncle, and left cerebellum with associated mild mass effect on the structures without edema. No other areas of abnormal enhancement. The cisternal left seventh and eighth cranial nerves are not visualized ant the intracanalicular nerves are not well characterized. The visualized right seventh and eighth cranial nerves appear within normal limits. Normal CSF signal within the labyrinth bilaterally. Small right and trace left mastoid effusions. Vascular: Major arterial flow voids are maintained at the skull base. Skull and upper cervical spine: Normal marrow signal.  Sinuses/Orbits: Mild paranasal sinus mucosal thickening. No acute orbital findings. IMPRESSION: Cystic and solid enhancing 3.6 cm mass centered in the left cerebellopontine angle, detailed above and most likely a cisternal schwannoma. The mass abuts, but does not extend into the IAC. Mild mass effect on the adjacent pons, middle cerebellar peduncle, and cerebellum. Electronically Signed   By: FMargaretha SheffieldM.D.   On: 04/03/2021 10:34   [place]NAL  Result Date: 04/21/2021 CLINICAL DATA:  Acute renal failure EXAM: RENAL / URINARY TRACT ULTRASOUND COMPLETE COMPARISON:  CT abdomen pelvis 01/02/2021 FINDINGS: Right Kidney: Renal measurements: 9.6 x 4.6 x 3.5 cm = volume: 81 mL. Echogenicity within normal limits. No mass or hydronephrosis visualized. Left Kidney: Renal measurements: 9.3 x 4.7 x 3.5 cm = volume: 81 mL. Echogenicity within normal limits. No mass or hydronephrosis visualized. Bladder: Unable to visualize. Other: Exam limited as patient was combative. IMPRESSION: No significant sonographic abnormality of the kidneys. Electronically Signed   By: FMiachel RouxM.D.   On: 04/21/2021 12:42   DG CHEST PORT 1 VIEW  Result Date: 04/29/2021 CLINICAL DATA:  Short of breath.  Pneumonia.  Pleural effusions. EXAM: PORTABLE CHEST 1 VIEW COMPARISON:  04/21/2021 FINDINGS: Stable large cardiac silhouette. Increase in patchy bilateral airspace densities in lower lobes. Increased vascular congestion in the upper lobes. No pneumothorax. IMPRESSION: 1.  Increasing bibasilar airspace disease concerning for edema versus pneumonia. 2. Increased central venous congestion the upper lobes. Electronically Signed   By: Suzy Bouchard M.D.   On: 04/29/2021 11:10   DG CHEST PORT 1 VIEW  Result Date: 04/21/2021 CLINICAL DATA:  Pneumonia EXAM: PORTABLE CHEST 1 VIEW COMPARISON:  04/18/2021, 04/03/2021 FINDINGS: Heart size. Atherosclerotic calcification of the aortic knob. Subtle reticulonodular opacity within the periphery of the  right upper lobe. No pleural effusion or pneumothorax. High-riding right humeral head. IMPRESSION: Subtle reticulonodular opacity within the periphery of the right upper lobe suspicious for developing infection. Electronically Signed   By: Davina Poke D.O.   On: 04/21/2021 11:20   DG Chest Port 1 View  Result Date: 04/03/2021 CLINICAL DATA:  Chest pain EXAM: PORTABLE CHEST 1 VIEW COMPARISON:  01/06/2021 FINDINGS: Stable borderline heart size and hyperinflation. There is no edema, consolidation, effusion, or pneumothorax. Artifact from EKG leads. IMPRESSION: Stable exam.  No acute finding. Electronically Signed   By: Monte Fantasia M.D.   On: 04/03/2021 06:39   EEG adult  Result Date: 04/19/2021 Lora Havens, MD     04/19/2021  3:51 PM Patient Name: CASSEL JARECKI MRN: [id number] Epilepsy Attending: Lora Havens Referring Physician/Provider: Dr Kerney Elbe Date: 04/19/2021 Duration: 25.07 mins Patient history: 87 year old man presenting with sudden onset of difficulty walking and ability to follow commands and also questionable left-sided facial weakness noted by EMS in the field.  EEG to evaluate for seizures Level of alertness: Awake AEDs during EEG study: None Technical aspects: This EEG study was done with scalp electrodes positioned according to the 10-20 International system of electrode placement. Electrical activity was acquired at a sampling rate of '500Hz'$  and reviewed with a high frequency filter of '70Hz'$  and a low frequency filter of '1Hz'$ . EEG data were recorded continuously and digitally stored. Description: The posterior dominant rhythm consists of  7.'5Hz'$  activity of moderate voltage (25-35 uV) seen predominantly in posterior head regions, symmetric and reactive to eye opening and eye closing. Drowsiness was characterized by attenuation of the posterior background rhythm.  EEG also showed continuous generalized 5 to 7 Hz theta slowing.  Hyperventilation and photic stimulation were not  performed.   ABNORMALITY - Continuous slow, generalized IMPRESSION: This study is suggestive of mild diffuse encephalopathy, nonspecific etiology. No seizures or epileptiform discharges were seen throughout the recording. Lora Havens   MR BRAIN/IAC W WO CONTRAST  Result Date: 04/03/2021 CLINICAL DATA:  Brain mass or lesion; mass EXAM: MRI HEAD WITHOUT AND WITH CONTRAST TECHNIQUE: Multiplanar, multiecho pulse sequences of the brain and surrounding structures were obtained without and with intravenous contrast. CONTRAST:  15m GADAVIST GADOBUTROL 1 MMOL/ML IV SOLN COMPARISON:  CT head from the same day. FINDINGS: Brain: No acute infarction, hemorrhage, hydrocephalus, or extra-axial fluid collection. Generalized atrophy with ex vacuo ventricular dilation. Mild for age scattered T2 hyperintensities in the white matter, likely related to chronic microvascular ischemic disease. Small remote infarct in the right frontoparietal white matter. Dedicated IAC protocol was performed. There is a cystic and solid extra-axial mass centered at the left cerebellopontine angle which measures approximately 3.6 x 2.2 x 2.9 cm (AP by transverse by craniocaudal). The solid portions of the mass are enhancing. The mass does not restrict diffusion. No intrinsic T1 hyperintensity. The mass abuts, but does not extend into the IAC. The mass abuts the left pons, middle cerebellar peduncle, and left cerebellum with associated mild mass effect on the structures without edema. No other  areas of abnormal enhancement. The cisternal left seventh and eighth cranial nerves are not visualized ant the intracanalicular nerves are not well characterized. The visualized right seventh and eighth cranial nerves appear within normal limits. Normal CSF signal within the labyrinth bilaterally. Small right and trace left mastoid effusions. Vascular: Major arterial flow voids are maintained at the skull base. Skull and upper cervical spine: Normal marrow  signal. Sinuses/Orbits: Mild paranasal sinus mucosal thickening. No acute orbital findings. IMPRESSION: Cystic and solid enhancing 3.6 cm mass centered in the left cerebellopontine angle, detailed above and most likely a cisternal schwannoma. The mass abuts, but does not extend into the IAC. Mild mass effect on the adjacent pons, middle cerebellar peduncle, and cerebellum. Electronically Signed   By: Margaretha Sheffield M.D.   On: 04/03/2021 10:34   CT HEAD CODE STROKE WO CONTRAST  Result Date: 04/18/2021 CLINICAL DATA:  Code stroke. Initial evaluation for acute left facial droop. EXAM: CT HEAD WITHOUT CONTRAST TECHNIQUE: Contiguous axial images were obtained from the base of the skull through the vertex without intravenous contrast. COMPARISON:  Prior studies from 04/03/2021. FINDINGS: Brain: Age-related cerebral atrophy with chronic microvascular ischemic disease. No acute intracranial hemorrhage. No acute large vessel territory infarct. Mixed solid and cystic mass at the left CP angle cistern again noted, stable. No other mass effect or midline shift. No hydrocephalus or extra-axial fluid collection. Vascular: No hyperdense vessel. Calcified atherosclerosis present at skull base. Skull: Scalp soft tissues and calvarium within normal limits. Sinuses/Orbits: Globes and orbital soft tissues demonstrate no acute finding. Senescent calcifications noted. Chronic mucoperiosteal thickening with possible sinonasal polyposis noted about the ethmoidal air cells and nasal cavities. Mastoid air cells are clear. Other: None. ASPECTS [facility] Stroke Program Early CT Score) - Ganglionic level infarction (caudate, lentiform nuclei, internal capsule, insula, M1-M3 cortex): 7 - Supraganglionic infarction (M4-M6 cortex): 3 Total score (0-10 with 10 being normal): 10 IMPRESSION: 1. No acute intracranial abnormality. 2. ASPECTS is 10. 3. Stable partially solid/partially cystic left CP angle cistern mass with localized mass effect. 4.  Underlying atrophy with chronic small vessel ischemic disease. 5. Question sinonasal polyposis. These results were communicated to Dr. Rory Percy at 9:20 pm on 04/18/2021 by text page via the [facility] messaging system. Electronically Signed   By: Jeannine Boga M.D.   On: 04/18/2021 21:21   CT ANGIO HEAD NECK W WO CM W PERF (CODE STROKE)  Result Date: 04/18/2021 CLINICAL DATA:  Initial evaluation for acute stroke. EXAM: CT ANGIOGRAPHY HEAD AND NECK CT PERFUSION BRAIN TECHNIQUE: Multidetector CT imaging of the head and neck was performed using the standard protocol during bolus administration of intravenous contrast. Multiplanar CT image reconstructions and MIPs were obtained to evaluate the vascular anatomy. Carotid stenosis measurements (when applicable) are obtained utilizing NASCET criteria, using the distal internal carotid diameter as the denominator. Multiphase CT imaging of the brain was performed following IV bolus contrast injection. Subsequent parametric perfusion maps were calculated using RAPID software. CONTRAST:  113m OMNIPAQUE IOHEXOL 350 MG/ML SOLN COMPARISON:  Prior CT from earlier the same day. FINDINGS: CTA NECK FINDINGS Aortic arch: Visualized aortic arch normal in caliber with normal 3 vessel morphology. Moderate atheromatous change about the visualized aorta and origin of the great vessels without hemodynamically significant stenosis. Right carotid system: Right CCA patent from its origin to the bifurcation without stenosis. Bulky calcified plaque about the right carotid bulb/proximal right ICA with mild 25% stenosis by NASCET criteria. Right ICA patent distally without stenosis, dissection or occlusion. Left carotid  system: Left CCA patent from its origin to the bifurcation without stenosis. Scattered plaque about the left bifurcation/proximal left ICA without significant stenosis. Left ICA patent distally without stenosis, dissection or occlusion. Vertebral arteries: Both vertebral arteries  arise from the subclavian arteries. Atheromatous change at the origins of both vertebral arteries without significant stenosis. Vertebral arteries patent without stenosis, dissection or occlusion. Skeleton: No visible acute osseous finding. No discrete osseous lesions. Patient is edentulous. Other neck: No other acute soft tissue abnormality within the neck. No mass or adenopathy. Upper chest: Upper lobe predominant centrilobular emphysema. Review of the MIP images confirms the above findings CTA HEAD FINDINGS Anterior circulation: Petrous segments patent bilaterally. Mild atheromatous change within the carotid siphons without significant stenosis. A1 segments patent bilaterally. Normal anterior communicating artery complex. Anterior cerebral arteries patent to their distal aspects without stenosis. No M1 high-grade stenosis or occlusion. Mild stenosis noted at the distal left M1 segment/left MCA bifurcation. Distal MCA branches well perfused and symmetric. Posterior circulation: Both V4 segments patent to the vertebrobasilar junction without stenosis. Left vertebral artery dominant. Left PICA patent. Right PICA not seen. Basilar patent to its distal aspect without stenosis. Superior cerebral arteries patent bilaterally. Both PCAs primarily supplied via the basilar well perfused or distal aspects. Venous sinuses: Grossly patent allowing for timing the contrast bolus. Anatomic variants: None significant.  No aneurysm. Review of the MIP images confirms the above findings CT Brain Perfusion Findings: ASPECTS: 10 CBF (<30%) Volume: 99m Perfusion (Tmax>6.0s) volume: 053mMismatch Volume: 41m43mnfarction Location:Negative CT perfusion with no evidence for acute ischemia or other perfusion deficit. IMPRESSION: 1. Negative CTA for emergent large vessel occlusion. 2. Negative CT perfusion with no evidence for acute ischemia or other perfusion deficit. 3. Atheromatous change about the right carotid bulb/proximal right ICA with  mild 25% stenosis by NASCET criteria. 4. Mild atheromatous change elsewhere about the major arterial vasculature of the head and neck as above. No other hemodynamically significant or correctable stenosis. 5. Emphysema (ICD10-J43.9). Results discussed by telephone at the time of interpretation on 04/18/2021 at 9:30 pm to provider [facility] verb[facility] acknowledged these results. Electronically Signed   By: BenJeannine BogaD.   On: 04/18/2021 21:55   [place] Limited RUQ (LIVER/GB)  Result Date: 04/19/2021 CLINICAL DATA:  Cholecystitis. EXAM: ULTRASOUND ABDOMEN LIMITED RIGHT UPPER QUADRANT COMPARISON:  Abdominopelvic CT 01/02/2021, abdominal ultrasound 12/28/2018 FINDINGS: Gallbladder: Gallbladder is difficult to delineate, however appears contracted. Wall thickening of 4 mm is not unexpected for nondistention. There is a shadowing stone in the gallbladder. No sonographic Murphy sign noted by sonographer. Common bile duct: Diameter: 7-8 mm. Liver: No focal lesion identified. Within normal limits in parenchymal echogenicity. Portal vein is patent on color Doppler imaging with normal direction of blood flow towards the liver. Other: Technically limited exam, performed in left lateral decubitus positioning. Patient was asleep through the exam. IMPRESSION: 1. Gallstone within contracted gallbladder. Wall thickness of 4 mm is not unexpected for nondistention. No sonographic findings to suggest acute cholecystitis. 2. Common bile duct is upper normal, likely normal for age. 3. Limited but grossly normal assessment of the liver. Electronically Signed   By: MelKeith RakeD.   On: 04/19/2021 01:41     Subjective: Patient was seen and examined at bedside.  Overnight events noted.   Patient reports feeling much improved. Wife and sister at bedside.   Patient is alert and oriented x 2,  feels better and want to be discharged.  Diarrhea has resolved.  Patient is being discharged home.  Discharge  Exam: Vitals:   04/30/21 0322 04/30/21 1236  BP: 139/83 (!) 146/57  Pulse: 85 89  Resp: 20   Temp: 98.2 F (36.8 C) 98 F (36.7 C)  SpO2: 97% 98%   Vitals:   04/29/21 1943 04/30/21 0023 04/30/21 0322 04/30/21 1236  BP: (!) 149/82 123/66 139/83 (!) 146/57  Pulse: 88 92 85 89  Resp: '20 16 20   '$ Temp: 98.3 F (36.8 C) 99.2 F (37.3 C) 98.2 F (36.8 C) 98 F (36.7 C)  TempSrc: Oral Oral Oral Oral  SpO2: 100% 91% 97% 98%  Weight:        General: Appears comfortable, not in any acute distress. Cardiovascular: S1-S2 heard, regular rate and rhythm, no murmur. Respiratory: CTA bilaterally, no wheezing, no rhonchi Abdominal: Soft, NT, ND, bowel sounds + Extremities: no edema, no cyanosis    The results of significant diagnostics from this hospitalization (including imaging, microbiology, ancillary and laboratory) are listed below for reference.     Microbiology: Recent Results (from the past 240 hour(s))  Culture, blood (routine x 2)     Status: None   Collection Time: 04/21/21  9:04 AM   Specimen: Right Antecubital; Blood  Result Value Ref Range Status   Specimen Description RIGHT ANTECUBITAL  Final   Special Requests   Final    BOTTLES DRAWN AEROBIC AND ANAEROBIC Blood Culture adequate volume   Culture   Final    NO GROWTH 5 DAYS Performed at [facility], [address] [place], [place] [address]    Report Status 04/26/2021 FINAL  Final  Culture, blood (routine x 2)     Status: Abnormal   Collection Time: 04/21/21  9:14 AM   Specimen: BLOOD RIGHT ARM  Result Value Ref Range Status   Specimen Description BLOOD RIGHT ARM  Final   Special Requests   Final    BOTTLES DRAWN AEROBIC AND ANAEROBIC Blood Culture adequate volume   Culture  Setup Time   Final    GRAM POSITIVE COCCI IN BOTH AEROBIC AND ANAEROBIC BOTTLES CRITICAL RESULT CALLED TO, READ BACK BY AND VERIFIED WITH: PHARMD GREG ABBOTT 04/22/21'@3'$ :36 BY TW    Culture (A)  Final    STAPHYLOCOCCUS  EPIDERMIDIS STAPHYLOCOCCUS HAEMOLYTICUS THE SIGNIFICANCE OF ISOLATING THIS ORGANISM FROM A SINGLE SET OF BLOOD CULTURES WHEN MULTIPLE SETS ARE DRAWN IS UNCERTAIN. PLEASE NOTIFY THE MICROBIOLOGY DEPARTMENT WITHIN ONE WEEK IF SPECIATION AND SENSITIVITIES ARE REQUIRED. Performed at [facility], [place] [address], [place], [place] [address]    Report Status 04/24/2021 FINAL  Final  Blood Culture ID Panel (Reflexed)     Status: Abnormal   Collection Time: 04/21/21  9:14 AM  Result Value Ref Range Status   Enterococcus faecalis NOT DETECTED NOT DETECTED Final   Enterococcus Faecium NOT DETECTED NOT DETECTED Final   Listeria monocytogenes NOT DETECTED NOT DETECTED Final   Staphylococcus species DETECTED (A) NOT DETECTED Final    Comment: CRITICAL RESULT CALLED TO, READ BACK BY AND VERIFIED WITH: PHARMD GREG ABBOTT 04/22/21'@3'$ :36 BY TW    Staphylococcus aureus (BCID) NOT DETECTED NOT DETECTED Final   Staphylococcus epidermidis DETECTED (A) NOT DETECTED Final    Comment: Methicillin (oxacillin) resistant coagulase negative staphylococcus. Possible blood culture contaminant (unless isolated from more than one blood culture draw or clinical case suggests pathogenicity). No antibiotic treatment is indicated for blood  culture contaminants. CRITICAL RESULT CALLED TO, READ BACK BY AND VERIFIED  WITH: PHARMD GREG ABBOTT 04/22/21'@3'$ :36 BY TW    Staphylococcus lugdunensis NOT DETECTED NOT DETECTED Final   Streptococcus species NOT DETECTED NOT DETECTED Final   Streptococcus agalactiae NOT DETECTED NOT DETECTED Final   Streptococcus pneumoniae NOT DETECTED NOT DETECTED Final   Streptococcus pyogenes NOT DETECTED NOT DETECTED Final   A.calcoaceticus-baumannii NOT DETECTED NOT DETECTED Final   Bacteroides fragilis NOT DETECTED NOT DETECTED Final   Enterobacterales NOT DETECTED NOT DETECTED Final   Enterobacter cloacae complex NOT DETECTED NOT DETECTED Final   Escherichia coli NOT DETECTED NOT DETECTED  Final   Klebsiella aerogenes NOT DETECTED NOT DETECTED Final   Klebsiella oxytoca NOT DETECTED NOT DETECTED Final   Klebsiella pneumoniae NOT DETECTED NOT DETECTED Final   Proteus species NOT DETECTED NOT DETECTED Final   Salmonella species NOT DETECTED NOT DETECTED Final   Serratia marcescens NOT DETECTED NOT DETECTED Final   Haemophilus influenzae NOT DETECTED NOT DETECTED Final   Neisseria meningitidis NOT DETECTED NOT DETECTED Final   Pseudomonas aeruginosa NOT DETECTED NOT DETECTED Final   Stenotrophomonas maltophilia NOT DETECTED NOT DETECTED Final   Candida albicans NOT DETECTED NOT DETECTED Final   Candida auris NOT DETECTED NOT DETECTED Final   Candida glabrata NOT DETECTED NOT DETECTED Final   Candida krusei NOT DETECTED NOT DETECTED Final   Candida parapsilosis NOT DETECTED NOT DETECTED Final   Candida tropicalis NOT DETECTED NOT DETECTED Final   Cryptococcus neoformans/gattii NOT DETECTED NOT DETECTED Final   Methicillin resistance mecA/C DETECTED (A) NOT DETECTED Final    Comment: CRITICAL RESULT CALLED TO, READ BACK BY AND VERIFIED WITH: PHARMD GREG ABBOTT 04/22/21'@3'$ :36 BY TW Performed at [facility], [address], [place], [place] [address]   C Difficile Quick Screen (NO PCR Reflex)     Status: Abnormal   Collection Time: 04/21/21 12:00 PM   Specimen: Urine, Clean Catch; Stool  Result Value Ref Range Status   C Diff antigen POSITIVE (A) NEGATIVE Final   C Diff toxin NEGATIVE NEGATIVE Final   C Diff interpretation   Final    Results are indeterminate. Please contact the provider listed for your campus for C diff questions in [place].    Comment: Performed at [facility], [place] [address], [place], [place] [address]  Gastrointestinal Panel by PCR , Stool     Status: None   Collection Time: 04/21/21 12:00 PM   Specimen: Urine, Clean Catch; Stool  Result Value Ref Range Status   Campylobacter species NOT DETECTED NOT DETECTED Final   Plesimonas shigelloides  NOT DETECTED NOT DETECTED Final   Salmonella species NOT DETECTED NOT DETECTED Final   Yersinia enterocolitica NOT DETECTED NOT DETECTED Final   Vibrio species NOT DETECTED NOT DETECTED Final   Vibrio cholerae NOT DETECTED NOT DETECTED Final   Enteroaggregative E coli (EAEC) NOT DETECTED NOT DETECTED Final   Enteropathogenic E coli (EPEC) NOT DETECTED NOT DETECTED Final   Enterotoxigenic E coli (ETEC) NOT DETECTED NOT DETECTED Final   Shiga like toxin producing E coli (STEC) NOT DETECTED NOT DETECTED Final   Shigella/Enteroinvasive E coli (EIEC) NOT DETECTED NOT DETECTED Final   Cryptosporidium NOT DETECTED NOT DETECTED Final   Cyclospora cayetanensis NOT DETECTED NOT DETECTED Final   Entamoeba histolytica NOT DETECTED NOT DETECTED Final   Giardia lamblia NOT DETECTED NOT DETECTED Final   Adenovirus F40/41 NOT DETECTED NOT DETECTED Final   Astrovirus NOT DETECTED NOT DETECTED Final   Norovirus GI/GII NOT DETECTED NOT DETECTED Final  Rotavirus A NOT DETECTED NOT DETECTED Final   Sapovirus (I, II, IV, and V) NOT DETECTED NOT DETECTED Final    Comment: Performed at [facility], [address] [place], [place] [address]  C. Diff by PCR, Reflexed     Status: Abnormal   Collection Time: 04/21/21 12:00 PM  Result Value Ref Range Status   Toxigenic C. Difficile by PCR POSITIVE (A) NEGATIVE Final    Comment: Positive for toxigenic C. difficile with little to no toxin production. Only treat if clinical presentation suggests symptomatic illness. Performed at [facility], [place] [address], [place],  [address]      Labs: BNP (last 3 results) Recent Labs    12/27/20 1754  BNP 0000000*   Basic Metabolic Panel: Recent Labs  Lab 04/25/21 0416 04/26/21 0316 04/27/21 0351 04/28/21 0333 04/29/21 0225  NA 145 139 135 135 137  K 4.2 2.7* 2.4* 3.0* 3.7  CL 109 97* 101 100 105  CO2 '26 28 28 26 24  '$ GLUCOSE 89 87 93 92 106*  BUN 10 5* 7* 6* 7*  CREATININE 1.10 0.75 0.85  0.79 0.82  CALCIUM 9.0 9.0 8.0* 8.1* 8.1*  MG  --  1.4* 1.4* 1.6* 1.8  PHOS  --   --  2.7 2.4* 4.7*   Liver Function Tests: Recent Labs  Lab 04/24/21 0341 04/25/21 0416 04/26/21 0316  AST 36 31 22  ALT '24 22 21  '$ ALKPHOS 71 70 68  BILITOT 0.8 1.0 0.6  PROT 5.6* 5.9* 6.0*  ALBUMIN 2.5* 2.6* 2.6*   No results for input(s): LIPASE, AMYLASE in the last 168 hours. No results for input(s): AMMONIA in the last 168 hours. CBC: Recent Labs  Lab 04/24/21 0341 04/26/21 0316 04/27/21 0351 04/28/21 0333 04/29/21 0225  WBC 9.2 9.0 6.5 8.3 8.0  HGB 10.2* 10.3* 8.9* 8.8* 9.1*  HCT 31.5* 32.1* 27.3* 27.4* 28.3*  MCV 86.3 86.1 84.5 85.6 86.3  PLT 181 174 229 222 206   Cardiac Enzymes: No results for input(s): CKTOTAL, CKMB, CKMBINDEX, TROPONINI in the last 168 hours. BNP: Invalid input(s): POCBNP CBG: No results for input(s): GLUCAP in the last 168 hours. D-Dimer No results for input(s): DDIMER in the last 72 hours. Hgb A1c No results for input(s): HGBA1C in the last 72 hours. Lipid Profile No results for input(s): CHOL, HDL, LDLCALC, TRIG, CHOLHDL, LDLDIRECT in the last 72 hours. Thyroid function studies No results for input(s): TSH, T4TOTAL, T3FREE, THYROIDAB in the last 72 hours.  Invalid input(s): FREET3 Anemia work up No results for input(s): VITAMINB12, FOLATE, FERRITIN, TIBC, IRON, RETICCTPCT in the last 72 hours. Urinalysis    Component Value Date/Time   COLORURINE YELLOW 04/21/2021 1200   APPEARANCEUR CLEAR 04/21/2021 1200   APPEARANCEUR Clear 04/29/2013 1543   LABSPEC 1.025 04/21/2021 1200   LABSPEC 1.020 04/29/2013 1543   PHURINE 6.0 04/21/2021 1200   GLUCOSEU NEGATIVE 04/21/2021 1200   GLUCOSEU Negative 04/29/2013 1543   HGBUR NEGATIVE 04/21/2021 1200   BILIRUBINUR SMALL (A) 04/21/2021 1200   BILIRUBINUR + 07/25/2018 1304   BILIRUBINUR Negative 04/29/2013 1543   KETONESUR >80 (A) 04/21/2021 1200   PROTEINUR 30 (A) 04/21/2021 1200   UROBILINOGEN 0.2  07/25/2018 1304   UROBILINOGEN 0.2 03/08/2012 1624   NITRITE NEGATIVE 04/21/2021 1200   LEUKOCYTESUR NEGATIVE 04/21/2021 1200   LEUKOCYTESUR Negative 04/29/2013 1543   Sepsis Labs Invalid input(s): PROCALCITONIN,  WBC,  LACTICIDVEN Microbiology Recent Results (from the past 240 hour(s))  Culture, blood (routine  x 2)     Status: None   Collection Time: 04/21/21  9:04 AM   Specimen: Right Antecubital; Blood  Result Value Ref Range Status   Specimen Description RIGHT ANTECUBITAL  Final   Special Requests   Final    BOTTLES DRAWN AEROBIC AND ANAEROBIC Blood Culture adequate volume   Culture   Final    NO GROWTH 5 DAYS Performed at [facility], [address], [place], [place] [address]    Report Status 04/26/2021 FINAL  Final  Culture, blood (routine x 2)     Status: Abnormal   Collection Time: 04/21/21  9:14 AM   Specimen: BLOOD RIGHT ARM  Result Value Ref Range Status   Specimen Description BLOOD RIGHT ARM  Final   Special Requests   Final    BOTTLES DRAWN AEROBIC AND ANAEROBIC Blood Culture adequate volume   Culture  Setup Time   Final    GRAM POSITIVE COCCI IN BOTH AEROBIC AND ANAEROBIC BOTTLES CRITICAL RESULT CALLED TO, READ BACK BY AND VERIFIED WITH: PHARMD GREG ABBOTT 04/22/21'@3'$ :36 BY TW    Culture (A)  Final    STAPHYLOCOCCUS EPIDERMIDIS STAPHYLOCOCCUS HAEMOLYTICUS THE SIGNIFICANCE OF ISOLATING THIS ORGANISM FROM A SINGLE SET OF BLOOD CULTURES WHEN MULTIPLE SETS ARE DRAWN IS UNCERTAIN. PLEASE NOTIFY THE MICROBIOLOGY DEPARTMENT WITHIN ONE WEEK IF SPECIATION AND SENSITIVITIES ARE REQUIRED. Performed at [facility], [place] [address] [place], [place] [address]    Report Status 04/24/2021 FINAL  Final  Blood Culture ID Panel (Reflexed)     Status: Abnormal   Collection Time: 04/21/21  9:14 AM  Result Value Ref Range Status   Enterococcus faecalis NOT DETECTED NOT DETECTED Final   Enterococcus Faecium NOT DETECTED NOT DETECTED Final   Listeria monocytogenes NOT  DETECTED NOT DETECTED Final   Staphylococcus species DETECTED (A) NOT DETECTED Final    Comment: CRITICAL RESULT CALLED TO, READ BACK BY AND VERIFIED WITH: PHARMD GREG ABBOTT 04/22/21'@3'$ :36 BY TW    Staphylococcus aureus (BCID) NOT DETECTED NOT DETECTED Final   Staphylococcus epidermidis DETECTED (A) NOT DETECTED Final    Comment: Methicillin (oxacillin) resistant coagulase negative staphylococcus. Possible blood culture contaminant (unless isolated from more than one blood culture draw or clinical case suggests pathogenicity). No antibiotic treatment is indicated for blood  culture contaminants. CRITICAL RESULT CALLED TO, READ BACK BY AND VERIFIED WITH: PHARMD GREG ABBOTT 04/22/21'@3'$ :36 BY TW    Staphylococcus lugdunensis NOT DETECTED NOT DETECTED Final   Streptococcus species NOT DETECTED NOT DETECTED Final   Streptococcus agalactiae NOT DETECTED NOT DETECTED Final   Streptococcus pneumoniae NOT DETECTED NOT DETECTED Final   Streptococcus pyogenes NOT DETECTED NOT DETECTED Final   A.calcoaceticus-baumannii NOT DETECTED NOT DETECTED Final   Bacteroides fragilis NOT DETECTED NOT DETECTED Final   Enterobacterales NOT DETECTED NOT DETECTED Final   Enterobacter cloacae complex NOT DETECTED NOT DETECTED Final   Escherichia coli NOT DETECTED NOT DETECTED Final   Klebsiella aerogenes NOT DETECTED NOT DETECTED Final   Klebsiella oxytoca NOT DETECTED NOT DETECTED Final   Klebsiella pneumoniae NOT DETECTED NOT DETECTED Final   Proteus species NOT DETECTED NOT DETECTED Final   Salmonella species NOT DETECTED NOT DETECTED Final   Serratia marcescens NOT DETECTED NOT DETECTED Final   Haemophilus influenzae NOT DETECTED NOT DETECTED Final   Neisseria meningitidis NOT DETECTED NOT DETECTED Final   Pseudomonas aeruginosa NOT DETECTED NOT DETECTED Final   Stenotrophomonas maltophilia NOT DETECTED NOT DETECTED Final   Candida albicans NOT DETECTED  NOT DETECTED Final   Candida auris NOT DETECTED NOT  DETECTED Final   Candida glabrata NOT DETECTED NOT DETECTED Final   Candida krusei NOT DETECTED NOT DETECTED Final   Candida parapsilosis NOT DETECTED NOT DETECTED Final   Candida tropicalis NOT DETECTED NOT DETECTED Final   Cryptococcus neoformans/gattii NOT DETECTED NOT DETECTED Final   Methicillin resistance mecA/C DETECTED (A) NOT DETECTED Final    Comment: CRITICAL RESULT CALLED TO, READ BACK BY AND VERIFIED WITH: PHARMD GREG ABBOTT 04/22/21'@3'$ :36 BY TW Performed at [facility] [address], [place], [place] [address]   C Difficile Quick Screen (NO PCR Reflex)     Status: Abnormal   Collection Time: 04/21/21 12:00 PM   Specimen: Urine, Clean Catch; Stool  Result Value Ref Range Status   C Diff antigen POSITIVE (A) NEGATIVE Final   C Diff toxin NEGATIVE NEGATIVE Final   C Diff interpretation   Final    Results are indeterminate. Please contact the provider listed for your campus for C diff questions in [place].    Comment: Performed at [facility], [place] [address], [place], [place] [address]  Gastrointestinal Panel by PCR , Stool     Status: None   Collection Time: 04/21/21 12:00 PM   Specimen: Urine, Clean Catch; Stool  Result Value Ref Range Status   Campylobacter species NOT DETECTED NOT DETECTED Final   Plesimonas shigelloides NOT DETECTED NOT DETECTED Final   Salmonella species NOT DETECTED NOT DETECTED Final   Yersinia enterocolitica NOT DETECTED NOT DETECTED Final   Vibrio species NOT DETECTED NOT DETECTED Final   Vibrio cholerae NOT DETECTED NOT DETECTED Final   Enteroaggregative E coli (EAEC) NOT DETECTED NOT DETECTED Final   Enteropathogenic E coli (EPEC) NOT DETECTED NOT DETECTED Final   Enterotoxigenic E coli (ETEC) NOT DETECTED NOT DETECTED Final   Shiga like toxin producing E coli (STEC) NOT DETECTED NOT DETECTED Final   Shigella/Enteroinvasive E coli (EIEC) NOT DETECTED NOT DETECTED Final   Cryptosporidium NOT DETECTED NOT DETECTED Final   Cyclospora  cayetanensis NOT DETECTED NOT DETECTED Final   Entamoeba histolytica NOT DETECTED NOT DETECTED Final   Giardia lamblia NOT DETECTED NOT DETECTED Final   Adenovirus F40/41 NOT DETECTED NOT DETECTED Final   Astrovirus NOT DETECTED NOT DETECTED Final   Norovirus GI/GII NOT DETECTED NOT DETECTED Final   Rotavirus A NOT DETECTED NOT DETECTED Final   Sapovirus (I, II, IV, and V) NOT DETECTED NOT DETECTED Final    Comment: Performed at [facility], [address] [place], [place] [address]  C. Diff by PCR, Reflexed     Status: Abnormal   Collection Time: 04/21/21 12:00 PM  Result Value Ref Range Status   Toxigenic C. Difficile by PCR POSITIVE (A) NEGATIVE Final    Comment: Positive for toxigenic C. difficile with little to no toxin production. Only treat if clinical presentation suggests symptomatic illness. Performed at [facility], [place] [address], [place],  [address]      Time coordinating discharge: Over 30 minutes  SIGNED:   Shawna Clamp, MD  [facility] 04/30/2021, 2:23 PM Pager   If 7PM-7AM, please contact night-coverage

## 2021-04-30 NOTE — Discharge Instructions (Signed)
Advised to follow up PCP in one week. Advised to complete Dificid twice daily for 2 more days to complete 10 day treatment for Co. Difficile colitis.

## 2021-04-30 NOTE — Plan of Care (Signed)

## 2021-05-01 ENCOUNTER — Telehealth: Payer: Self-pay

## 2021-05-01 NOTE — Telephone Encounter (Signed)
Spoke to pt's wife about the medication. She is fine with that. Can we do 145 on Thursday and use the same day?

## 2021-05-01 NOTE — Telephone Encounter (Signed)
Pt was sent home on Dificid to take 2 more tablets and the cycle would be complete. The pharmacy cannot open the box for 2 pills. It costs $4500 a box and would be wasted.   Does he need another round of an antibiotic or will it be okay that he not take the last 2 pills of the cycle?

## 2021-05-02 NOTE — Telephone Encounter (Signed)
Spoke to pt's wife. Appt made for 05-04-21.

## 2021-05-04 ENCOUNTER — Encounter: Payer: Self-pay | Admitting: Internal Medicine

## 2021-05-04 ENCOUNTER — Ambulatory Visit (INDEPENDENT_AMBULATORY_CARE_PROVIDER_SITE_OTHER): Payer: PPO | Admitting: Internal Medicine

## 2021-05-04 ENCOUNTER — Other Ambulatory Visit: Payer: Self-pay

## 2021-05-04 VITALS — BP 138/76 | HR 80 | Temp 98.0°F | Ht 67.0 in | Wt 139.0 lb

## 2021-05-04 DIAGNOSIS — J449 Chronic obstructive pulmonary disease, unspecified: Secondary | ICD-10-CM

## 2021-05-04 DIAGNOSIS — K2100 Gastro-esophageal reflux disease with esophagitis, without bleeding: Secondary | ICD-10-CM

## 2021-05-04 DIAGNOSIS — A0471 Enterocolitis due to Clostridium difficile, recurrent: Secondary | ICD-10-CM

## 2021-05-04 DIAGNOSIS — R410 Disorientation, unspecified: Secondary | ICD-10-CM

## 2021-05-04 DIAGNOSIS — Z23 Encounter for immunization: Secondary | ICD-10-CM

## 2021-05-04 DIAGNOSIS — K219 Gastro-esophageal reflux disease without esophagitis: Secondary | ICD-10-CM | POA: Insufficient documentation

## 2021-05-04 MED ORDER — PANTOPRAZOLE SODIUM 40 MG PO TBEC: 40.0000 mg | DELAYED_RELEASE_TABLET | Freq: Every day | ORAL | 0 refills | Status: DC

## 2021-05-04 NOTE — Assessment & Plan Note (Signed)
Admitted but no clear etiology Better now Will stop the quetiapine (wife will hold on to it just in case)

## 2021-05-04 NOTE — Assessment & Plan Note (Signed)
Breathing is okay on symbicort

## 2021-05-04 NOTE — Assessment & Plan Note (Signed)
Now diarrhea is finally resolved Couldn't fil the 2 days of dificid--had to have the full 10 day pack Will hold off unless recurrent symptoms

## 2021-05-04 NOTE — Progress Notes (Signed)
Subjective:    Patient ID: Leon Estes, male    DOB: Jun 08, 1931, 87 y.o.   MRN: [id number]  HPI Here with wife for hospital follow up This visit occurred during the SARS-CoV-2 public health emergency.  Safety protocols were in place, including screening questions prior to the visit, additional usage of staff PPE, and extensive cleaning of exam room while observing appropriate contact time as indicated for disinfecting solutions.   Admitted to hospital after acute change in mental status Stroke ruled out Agitated in hospital---even kicking at wife (and bed railings,etc) Put on quetiapine--but wife is concerned due to sedation and trouble walking after taking it  Recurrent diarrhea---got dificid for 8 days in hospital Wife couldn't afford to fill the last 2 days at the pharmacy  Walking with walker---has ramp for steps at house Came here just holding onto wife Showered with wife's help Dresses himself---wife with stand by (and slight help) Is using the bathroom himself--no more diarrhea   Had some pain from throat and down towards stomach this morning Happened during breakfast--wife got him tums and he drank sprite Has been taking the pantoprazole after eating Slight abdominal discomfort still  Current Outpatient Medications on File Prior to Visit  Medication Sig Dispense Refill   acetaminophen (TYLENOL) 500 MG tablet Take 1,000 mg by mouth every 6 (six) hours as needed for moderate pain or headache.     aspirin EC 81 MG tablet Take 81 mg by mouth daily.     budesonide-formoterol (SYMBICORT) 160-4.5 MCG/ACT inhaler INHALE 2 PUFFS INTO THE LUNGS TWICE DAILY (Patient taking differently: Inhale 2 puffs into the lungs in the morning and at bedtime.) 10.2 g 5   gabapentin (NEURONTIN) 300 MG capsule Take 300 mg by mouth at bedtime as needed (pain).     Multiple Vitamin (MULTIVITAMIN WITH MINERALS) TABS tablet Take 1 tablet by mouth daily.     pantoprazole (PROTONIX) 40 MG tablet TAKE  1 TABLET BY MOUTH ONCE A DAY (Patient taking differently: Take 40 mg by mouth daily.) 90 tablet 3   potassium chloride (KLOR-CON) 10 MEQ tablet Take 1 tablet (10 mEq total) by mouth daily. 90 tablet 3   simvastatin (ZOCOR) 40 MG tablet Take 40 mg by mouth every evening.     tamsulosin (FLOMAX) 0.4 MG CAPS capsule TAKE 1 CAPSULE BY MOUTH ONCE DAILY (Patient taking differently: Take 0.4 mg by mouth at bedtime.) 90 capsule 3   traMADol (ULTRAM) 50 MG tablet Take 50 mg by mouth 2 (two) times daily as needed for moderate pain.     QUEtiapine (SEROQUEL) 25 MG tablet Take 1 tablet (25 mg total) by mouth at bedtime. (Patient not taking: Reported on 05/04/2021) 30 tablet 0   No current facility-administered medications on file prior to visit.    Allergies  Allergen Reactions   Sulfa Antibiotics Rash   Benzalkonium Chloride     "Benzalkonium chloride is a quaternary ammonium antiseptic and disinfectant with actions and uses similar to those of other cationic surfactants. It is also used as an antimicrobial preservative for pharmaceutical products."   Neosporin [Neomycin-Bacitracin Zn-Polymyx]    Augmentin [Amoxicillin-Pot Clavulanate] Rash   Terbinafine Rash   Terbinafine And Related Rash    Past Medical History:  Diagnosis Date   Asthma    BPH (benign prostatic hypertrophy)    Cataract    Chronic renal disease, stage III (HCC)    Chronic sinusitis    COPD (chronic obstructive pulmonary disease) ([place])    Coronary artery  disease    2 stents   Gall stones    Hearing loss    DOES NOT WEAR HEARING AIDS   High cholesterol    Hypertension    Macular degeneration    right eye    Past Surgical History:  Procedure Laterality Date   APPENDECTOMY     CORONARY ANGIOPLASTY WITH STENT PLACEMENT  1998   2 stents   TRANSURETHRAL RESECTION OF PROSTATE      Family History  Problem Relation Age of Onset   Diabetes Father    Heart disease Brother     Social History   Socioeconomic History    Marital status: Married    Spouse name: Not on file   Number of children: 4   Years of education: Not on file   Highest education level: Not on file  Occupational History   Occupation: Drove truck and concrete work  Tobacco Use   Smoking status: Former    Types: Cigarettes    Quit date: 08/13/2005    Years since quitting: 15.7   Smokeless tobacco: Current    Types: Chew   Tobacco comments:    discussed stopping this (only once in a while)  Vaping Use   Vaping Use: Never used  Substance and Sexual Activity   Alcohol use: No   Drug use: No   Sexual activity: Not on file  Other Topics Concern   Not on file  Social History Narrative   No living will   Wife, then [place] daughter should make decisions   Would accept resuscitation   Would probably accept tube feeds   Social Determinants of Health   Financial Resource Strain: Not on file  Food Insecurity: Not on file  Transportation Needs: Not on file  Physical Activity: Not on file  Stress: Not on file  Social Connections: Not on file  Intimate Partner Violence: Not on file   Review of Systems Appetite good--eating well at home Weight still down some Breathing has been okay---still on symbicort    Objective:   Physical Exam Constitutional:      Appearance: Normal appearance.  Cardiovascular:     Rate and Rhythm: Normal rate and regular rhythm.     Heart sounds: No murmur heard.   No gallop.  Pulmonary:     Effort: Pulmonary effort is normal.     Breath sounds: Normal breath sounds. No wheezing or rales.  Abdominal:     Palpations: Abdomen is soft.     Tenderness: There is no abdominal tenderness.  Musculoskeletal:     Right lower leg: No edema.     Left lower leg: No edema.  Neurological:     General: No focal deficit present.     Mental Status: He is alert and oriented to person, place, and time.           Assessment & Plan:

## 2021-05-04 NOTE — Assessment & Plan Note (Signed)
Symptoms after eating this morning No tenderness to suggest this is recurrent gallbladder attack Will have them change the protonix to fasting--give at bedtime

## 2021-05-04 NOTE — Addendum Note (Signed)
Addended by: Ophelia Shoulder on: 05/04/2021 02:19 PM   Modules accepted: Orders

## 2021-05-05 LAB — RENAL FUNCTION PANEL
Albumin: 3.2 g/dL — ABNORMAL LOW (ref 3.5–5.2)
BUN: 12 mg/dL (ref 6–23)
CO2: 27 mEq/L (ref 19–32)
Calcium: 8.8 mg/dL (ref 8.4–10.5)
Chloride: 105 mEq/L (ref 96–112)
Creatinine, Ser: 1.05 mg/dL (ref 0.40–1.50)
GFR: 63.55 mL/min (ref >60.00)
Glucose, Bld: 94 mg/dL (ref 70–99)
Phosphorus: 3.5 mg/dL (ref 2.3–4.6)
Potassium: 4.9 mEq/L (ref 3.5–5.1)
Sodium: 140 mEq/L (ref 135–145)

## 2021-05-05 LAB — CBC
HCT: 31.5 % — ABNORMAL LOW (ref 39.0–52.0)
Hemoglobin: 10.2 g/dL — ABNORMAL LOW (ref 13.0–17.0)
MCHC: 32.4 g/dL (ref 30.0–36.0)
MCV: 85.7 fl (ref 78.0–100.0)
Platelets: 357 10*3/uL (ref 150.0–400.0)
RBC: 3.68 Mil/uL — ABNORMAL LOW (ref 4.22–5.81)
RDW: 20.3 % — ABNORMAL HIGH (ref 11.5–15.5)
WBC: 6.9 10*3/uL (ref 4.0–10.5)

## 2021-05-09 ENCOUNTER — Telehealth: Payer: Self-pay | Admitting: Internal Medicine

## 2021-05-09 MED ORDER — FIDAXOMICIN 200 MG PO TABS: 200.0000 mg | ORAL_TABLET | Freq: Two times a day (BID) | ORAL | 1 refills | Status: DC

## 2021-05-09 NOTE — Telephone Encounter (Signed)
ERROR

## 2021-05-09 NOTE — Telephone Encounter (Signed)
Leon Estes      Telephone Encounter  Signed  Creation Time:  05/09/2021  1:14 PM           Signed      Harmon Pier at [facility] states that the medication fidaxomicin (DIFICID) 200 MG TABS tablet, is 1200 dollars. States that you might would want to call another prescription in.

## 2021-05-09 NOTE — Addendum Note (Signed)
Addended by: Viviana Simpler I on: 05/09/2021 12:51 PM   Modules accepted: Orders

## 2021-05-09 NOTE — Telephone Encounter (Signed)
Spoke to pt's wife. Insurance recommended clarithromycin. Says it is cheaper than vancomycin.

## 2021-05-09 NOTE — Telephone Encounter (Signed)
Please let her know that I sent in a full prescription for the dificid again for him---she should give him 1 pill twice a day for 5 days, then 1 pill twice a day every other day. If she can't afford it, we will have to go back to the vancomycin.

## 2021-05-09 NOTE — Telephone Encounter (Signed)
Landon at [facility] states that the medication fidaxomicin (DIFICID) 200 MG TABS tablet, is 1200 dollars. States that you might would want to call another prescription in.

## 2021-05-09 NOTE — Telephone Encounter (Signed)
Pt  wife called stating that she believe the C diff have came back, that the pt is having diarrhea again. Please advise.

## 2021-05-09 NOTE — Telephone Encounter (Signed)
This info copied to previous message about this medication request.

## 2021-05-10 MED ORDER — METRONIDAZOLE 500 MG PO TABS: 500.0000 mg | ORAL_TABLET | Freq: Three times a day (TID) | ORAL | 0 refills | Status: DC

## 2021-05-10 NOTE — Addendum Note (Signed)
Addended by: Pilar Grammes on: 05/10/2021 10:00 AM   Modules accepted: Orders

## 2021-05-10 NOTE — Telephone Encounter (Signed)
Spoke to pt's wife. They let us know if he has an issue with the metronidazole. I have sent it to [facility].

## 2021-05-11 ENCOUNTER — Ambulatory Visit: Payer: PPO | Admitting: Internal Medicine

## 2021-05-19 ENCOUNTER — Telehealth: Payer: Self-pay | Admitting: Internal Medicine

## 2021-05-19 MED ORDER — NYSTATIN 100000 UNIT/GM EX CREA: 1.0000 "application " | TOPICAL_CREAM | Freq: Two times a day (BID) | CUTANEOUS | 1 refills | Status: DC

## 2021-05-19 MED ORDER — NYSTATIN 100000 UNIT/GM EX CREA: 1.0000 "application " | TOPICAL_CREAM | Freq: Two times a day (BID) | CUTANEOUS | 0 refills | Status: DC

## 2021-05-19 NOTE — Telephone Encounter (Signed)
Spoke with pt's wife, Wilburn Cornelia (on dpr), asking about rash.  States rash started 3-4 days ago. Thinks may be med reaction. Started metronidazole on 05/10/21, dx c.diff.  Area is really itchy.  Denies any other sxs.  States med has helped with diarrhea.  Plz advise at [phone] in Dr. Alla German absence.

## 2021-05-19 NOTE — Addendum Note (Signed)
Addended by: Ria Bush on: 05/19/2021 05:05 PM   Modules accepted: Orders

## 2021-05-19 NOTE — Telephone Encounter (Addendum)
Spoke with wife.  Rash similar to poison oak - tiny itchy bumps. Not hives, no vesicles.  Treating with lotion.  Metronidazole started 05/10/2021. Has 5 days left.  Diarrhea is better, now with formed stools.  Limited abx options. I suggested they try and finish 5 days if rash tolerable, otherwise may stop early and update [place] next week. Will send in nystatin in case yeast type rash.   First episode of C diff was 12/2020 with sepsis s/p prolonged hospitalization treated with vancomycin prolonged taper over 1 month.  Second episode was repeat hospitalization 04/2021 treated with dificid in hospital.  This is third episode of C diff, treating with 2 wk flagyl as dificid was unaffordable.

## 2021-05-19 NOTE — Telephone Encounter (Signed)
Pt wife called stating that pt is broke out in a rash up under his arm and its itching. Pt wife was wondering was the medication metroNIDAZOLE (FLAGYL) 500 MG tablet was breaking him out. Please advise.

## 2021-05-25 ENCOUNTER — Other Ambulatory Visit: Payer: Self-pay | Admitting: Family Medicine

## 2021-06-05 ENCOUNTER — Other Ambulatory Visit: Payer: Self-pay | Admitting: Internal Medicine

## 2021-06-05 NOTE — Telephone Encounter (Signed)
Last filed 03-31-21 #30 Lat OV 05-04-21 Next OV 06-12-21 [place]

## 2021-06-12 ENCOUNTER — Other Ambulatory Visit: Payer: Self-pay

## 2021-06-12 ENCOUNTER — Encounter: Payer: Self-pay | Admitting: Internal Medicine

## 2021-06-12 ENCOUNTER — Ambulatory Visit (INDEPENDENT_AMBULATORY_CARE_PROVIDER_SITE_OTHER): Payer: PPO | Admitting: Internal Medicine

## 2021-06-12 DIAGNOSIS — D496 Neoplasm of unspecified behavior of brain: Secondary | ICD-10-CM

## 2021-06-12 DIAGNOSIS — J301 Allergic rhinitis due to pollen: Secondary | ICD-10-CM

## 2021-06-12 DIAGNOSIS — J449 Chronic obstructive pulmonary disease, unspecified: Secondary | ICD-10-CM | POA: Diagnosis not present

## 2021-06-12 DIAGNOSIS — A0471 Enterocolitis due to Clostridium difficile, recurrent: Secondary | ICD-10-CM

## 2021-06-12 DIAGNOSIS — G935 Compression of brain: Secondary | ICD-10-CM

## 2021-06-12 NOTE — Assessment & Plan Note (Signed)
Breathing okay on the symbicort

## 2021-06-12 NOTE — Assessment & Plan Note (Signed)
Discussed trying OTC certizine or fexofenadine

## 2021-06-12 NOTE — Patient Instructions (Signed)
Please try over the counter cetirizine 10mg  daily or fexofenadine 180mg  --for the runny nose. You should take it at bedtime if they make you sleepy

## 2021-06-12 NOTE — Assessment & Plan Note (Signed)
Has likely Schwannoma with mild mass effect Not really related to any of his current illnesses---cholecystitis, then C Dif and delirium, etc Will probably only recheck if symptoms or perhaps in 6 months

## 2021-06-12 NOTE — Progress Notes (Signed)
Subjective:    Patient ID: Leon Estes, male    DOB: Feb 02, 1931, 87 y.o.   MRN: [id number]  HPI Here with wife for follow up of multiple medical issues This visit occurred during the SARS-CoV-2 public health emergency.  Safety protocols were in place, including screening questions prior to the visit, additional usage of staff PPE, and extensive cleaning of exam room while observing appropriate contact time as indicated for disinfecting solutions.   Doing okay No recent diarrhea now Eating okay Weight is stable---frustrated that he hasn't gained back weight  No confusion or delirium No falls now  Breathing is fine on the symbicort  Current Outpatient Medications on File Prior to Visit  Medication Sig Dispense Refill   acetaminophen (TYLENOL) 500 MG tablet Take 1,000 mg by mouth every 6 (six) hours as needed for moderate pain or headache.     aspirin EC 81 MG tablet Take 81 mg by mouth daily.     budesonide-formoterol (SYMBICORT) 160-4.5 MCG/ACT inhaler INHALE 2 PUFFS INTO THE LUNGS TWICE DAILY (Patient taking differently: Inhale 2 puffs into the lungs in the morning and at bedtime.) 10.2 g 5   gabapentin (NEURONTIN) 300 MG capsule Take 300 mg by mouth at bedtime as needed (pain).     Multiple Vitamin (MULTIVITAMIN WITH MINERALS) TABS tablet Take 1 tablet by mouth daily.     nystatin cream (MYCOSTATIN) APPLY 1 APPLICATION TO AFFECTED AREA 2 TIMES DAILY 30 g 1   pantoprazole (PROTONIX) 40 MG tablet Take 1 tablet (40 mg total) by mouth at bedtime. Or morning on empty stomach 1 tablet 0   potassium chloride (KLOR-CON) 10 MEQ tablet Take 1 tablet (10 mEq total) by mouth daily. 90 tablet 3   simvastatin (ZOCOR) 40 MG tablet Take 40 mg by mouth every evening.     tamsulosin (FLOMAX) 0.4 MG CAPS capsule TAKE 1 CAPSULE BY MOUTH ONCE DAILY (Patient taking differently: Take 0.4 mg by mouth at bedtime.) 90 capsule 3   traMADol (ULTRAM) 50 MG tablet TAKE 1 TABLET BY MOUTH ONCE A DAY AS NEEDED  FOR PAIN 30 tablet 0   No current facility-administered medications on file prior to visit.    Allergies  Allergen Reactions   Sulfa Antibiotics Rash   Benzalkonium Chloride     "Benzalkonium chloride is a quaternary ammonium antiseptic and disinfectant with actions and uses similar to those of other cationic surfactants. It is also used as an antimicrobial preservative for pharmaceutical products."   Neosporin [Neomycin-Bacitracin Zn-Polymyx]    Augmentin [Amoxicillin-Pot Clavulanate] Rash   Terbinafine Rash   Terbinafine And Related Rash    Past Medical History:  Diagnosis Date   Asthma    BPH (benign prostatic hypertrophy)    Cataract    Chronic renal disease, stage III (HCC)    Chronic sinusitis    COPD (chronic obstructive pulmonary disease) (HCC)    Coronary artery disease    2 stents   Gall stones    Hearing loss    DOES NOT WEAR HEARING AIDS   High cholesterol    Hypertension    Macular degeneration    right eye    Past Surgical History:  Procedure Laterality Date   APPENDECTOMY     CORONARY ANGIOPLASTY WITH STENT PLACEMENT  1998   2 stents   TRANSURETHRAL RESECTION OF PROSTATE      Family History  Problem Relation Age of Onset   Diabetes Father    Heart disease Brother  Social History   Socioeconomic History   Marital status: Married    Spouse name: Not on file   Number of children: 4   Years of education: Not on file   Highest education level: Not on file  Occupational History   Occupation: Drove truck and concrete work  Tobacco Use   Smoking status: Former    Types: Cigarettes    Quit date: 08/13/2005    Years since quitting: 15.8   Smokeless tobacco: Current    Types: Chew   Tobacco comments:    discussed stopping this (only once in a while)  Vaping Use   Vaping Use: Never used  Substance and Sexual Activity   Alcohol use: No   Drug use: No   Sexual activity: Not on file  Other Topics Concern   Not on file  Social History  Narrative   No living will   Wife, then [place] daughter should make decisions   Would accept resuscitation   Would probably accept tube feeds   Social Determinants of Health   Financial Resource Strain: Not on file  Food Insecurity: Not on file  Transportation Needs: Not on file  Physical Activity: Not on file  Stress: Not on file  Social Connections: Not on file  Intimate Partner Violence: Not on file   Review of Systems He has runny nose--wonders about azelastine or montelukast. (Per daughter). Has tried flonase and vicks Has taken tylenol PM to help sleep---discussed avoiding this if possible     Objective:   Physical Exam Constitutional:      Appearance: Normal appearance.  Cardiovascular:     Rate and Rhythm: Normal rate and regular rhythm.     Heart sounds: No murmur heard.   No gallop.  Pulmonary:     Effort: Pulmonary effort is normal.     Breath sounds: Normal breath sounds. No wheezing or rales.  Abdominal:     Palpations: Abdomen is soft.     Tenderness: There is no abdominal tenderness.  Musculoskeletal:     Cervical back: Neck supple.  Lymphadenopathy:     Cervical: No cervical adenopathy.  Neurological:     Mental Status: He is alert.           Assessment & Plan:

## 2021-06-12 NOTE — Assessment & Plan Note (Signed)
This seems to be gone now (hopefully)

## 2021-06-19 ENCOUNTER — Other Ambulatory Visit: Payer: Self-pay | Admitting: Internal Medicine

## 2021-06-20 ENCOUNTER — Other Ambulatory Visit: Payer: Self-pay | Admitting: Internal Medicine

## 2021-06-22 ENCOUNTER — Telehealth: Payer: Self-pay | Admitting: Internal Medicine

## 2021-06-22 NOTE — Telephone Encounter (Signed)
Patients wife just called the office in regads to the pt still having issues with his bowel movements. States he is not going very often but when he does, it is runny.   Please advise

## 2021-06-23 NOTE — Telephone Encounter (Signed)
Left message to call office

## 2021-06-26 NOTE — Telephone Encounter (Signed)
Spoke to pt's wife. She said he is doing better.

## 2021-07-12 ENCOUNTER — Other Ambulatory Visit: Payer: Self-pay | Admitting: Internal Medicine

## 2021-07-13 ENCOUNTER — Telehealth: Payer: Self-pay

## 2021-07-13 NOTE — Telephone Encounter (Signed)
[facility] - Client TELEPHONE ADVICE RECORD [facility] Patient Name: Leon Estes Gender: Male DOB: 1930/10/01 Age: 87 Y 3 M 24 D Return Phone Number: [phone] (Primary) Address: City/ State/ Zip: [place]  [address] Client [facility] - Client Client Site [facility] - Day Provider Viviana Simpler- MD Contact Type Call Who Is Calling Patient / Member / Family / Caregiver Call Type Triage / Clinical Caller Name [place] [facility] Relationship To Patient Other Return Phone Number [phone] (Primary) Chief Complaint Heart palpitations or irregular heartbeat Reason for Call Symptomatic / Request for [place] states, husband not feeling well. Heart not beating right. Palpitations. No chest pain. No appt. today. Has an appt. for tomorrow. Wants to speak with his doctor. Translation No Nurse Assessment Nurse: Claiborne Billings, RN, Kim Date/Time (Eastern Time): 07/13/2021 11:41:41 AM Confirm and document reason for call. If symptomatic, describe symptoms. ---Caller states her husband has had palpitations and hasn't felt well for a long time; hx of cardiac stents placed in 1998. He was taken off of Metoprolol and Lisinopril a few months ago. BP is currently 160/87. Does the patient have any new or worsening symptoms? ---Yes Will a triage be completed? ---Yes Related visit to physician within the last 2 weeks? ---No Does the PT have any chronic conditions? (i.e. diabetes, asthma, this includes High risk factors for pregnancy, etc.) ---Yes List chronic conditions. ---Asthma, Hx of heart attack with 3 to 4 stents placed. Is this a behavioral health or substance abuse call? ---No Guidelines Guideline Title Affirmed Question Affirmed Notes Nurse Date/Time (Eastern Time) Heart Rate and Heartbeat Questions History of heart disease (i.e., heart attack, bypass surgery,  angina, angioplasty, CHF) (Exception: brief heartbeat symptoms Claiborne Billings, RN, Maudie Mercury 07/13/2021 11:49:20 AM PLEASE NOTE: All timestamps contained within this report are represented as [place] Standard Time. CONFIDENTIALTY NOTICE: This fax transmission is intended only for the addressee. It contains information that is legally privileged, confidential or otherwise protected from use or disclosure. If you are not the intended recipient, you are strictly prohibited from reviewing, disclosing, copying using or disseminating any of this information or taking any action in reliance on or regarding this information. If you have received this fax in error, please notify us immediately by telephone so that we can arrange for its return to [place]. Phone: [phone], Toll-Free: [phone], Fax: [phone] Page: 2 of 2 Call Id: [id number] Guidelines Guideline Title Affirmed Question Affirmed Notes Nurse Date/Time Eilene Ghazi Time) that went away and now feels well) Disp. Time Eilene Ghazi Time) Disposition Final User 07/13/2021 11:52:01 AM See HCP within 4 Hours (or PCP triage) Yes Claiborne Billings, RN, Max Sane Disagree/Comply Disagree Caller Understands Yes PreDisposition Did not know what to do Care Advice Given Per Guideline SEE HCP (OR PCP TRIAGE) WITHIN 4 HOURS: * IF OFFICE WILL BE OPEN: You need to be seen within the next 3 or 4 hours. Call your doctor (or NP/PA) now or as soon as the office opens. CALL BACK IF: * You become worse CARE ADVICE given per Heart Rate and Heartbeat Questions (Adult) guideline. Comments User: Suezanne Jacquet, RN Date/Time Eilene Ghazi Time): 07/13/2021 12:03:09 PM Outcome reached was for caller to see physician within 4 hrs; nurse called backline and there was no answer, called mainline and held > 5 minutes without being able to speak with anyone. Caller informed that husband will need to be seen in UC or ER and she refuses, states they aren't interested  in going to either one of those due to  risk of exposure to sickness, she states husband has an appt in office tomorrow and she will just keep that, will call back for worsening sxs in the meantime. She is informed she may call office later today or in the morning to see if an earlier appt has become available and she verbalizes understanding. Referrals GO TO FACILITY REFUSE

## 2021-07-13 NOTE — Telephone Encounter (Signed)
I spoke with pt; pt said for 2 days has irregular heart beat and pts wife got on phone and said the irregular heart beat is a new symptom. Pt can feel the irregular heart beat in his chest on and off; is not continuous feeling. No CP,SOB,dizziness or H/A. No jaw,neck or arm pain. Pt already has appt scheduled with Dr Silvio Pate on 07/14/21. UC & ED precautions given and pts wife voiced understanding.I spoke with Dr Silvio Pate and he said for pt to keep appt on 07/14/21 unless condition worsens. Pts wife voiced understanding and appreciative. Sending note to Dr Silvio Pate as Juluis Rainier.

## 2021-07-13 NOTE — Telephone Encounter (Signed)
Likely just PACs or PVCs No significant symptoms so even if atrial fib--should be okay to wait till tomorrow

## 2021-07-14 ENCOUNTER — Other Ambulatory Visit: Payer: Self-pay

## 2021-07-14 ENCOUNTER — Encounter: Payer: Self-pay | Admitting: Internal Medicine

## 2021-07-14 ENCOUNTER — Ambulatory Visit (INDEPENDENT_AMBULATORY_CARE_PROVIDER_SITE_OTHER): Payer: PPO | Admitting: Internal Medicine

## 2021-07-14 DIAGNOSIS — R002 Palpitations: Secondary | ICD-10-CM | POA: Diagnosis not present

## 2021-07-14 NOTE — Assessment & Plan Note (Signed)
EKG is sinus with frequent PVCs. RBBB. No sig change since 04/18/21  Reassured----symptoms are benign Discussed letting me know if has exertional symptoms or this worsens

## 2021-07-14 NOTE — Progress Notes (Signed)
Subjective:    Patient ID: Leon Estes, male    DOB: October 01, 1930, 87 y.o.   MRN: [id number]  HPI Here with wife due to feeling his heart being irregular This visit occurred during the SARS-CoV-2 public health emergency.  Safety protocols were in place, including screening questions prior to the visit, additional usage of staff PPE, and extensive cleaning of exam room while observing appropriate contact time as indicated for disinfecting solutions.   For a couple of days, "my heart has wanted to surge" Felt like his heart was slower, then would speed up Generally lasts 2-3 seconds Seems better today Feels it when sitting He helps with dishes, etc---no problems with activity  Current Outpatient Medications on File Prior to Visit  Medication Sig Dispense Refill   acetaminophen (TYLENOL) 500 MG tablet Take 1,000 mg by mouth every 6 (six) hours as needed for moderate pain or headache.     aspirin EC 81 MG tablet Take 81 mg by mouth daily.     budesonide-formoterol (SYMBICORT) 160-4.5 MCG/ACT inhaler INHALE 2 PUFFS INTO THE LUNGS TWICE DAILY (Patient taking differently: Inhale 2 puffs into the lungs in the morning and at bedtime.) 10.2 g 5   gabapentin (NEURONTIN) 300 MG capsule TAKE 1 CAPSULE BY MOUTH ONCE DAILY 90 capsule 3   Multiple Vitamin (MULTIVITAMIN WITH MINERALS) TABS tablet Take 1 tablet by mouth daily.     nystatin cream (MYCOSTATIN) APPLY 1 APPLICATION TO AFFECTED AREA 2 TIMES DAILY 30 g 1   pantoprazole (PROTONIX) 40 MG tablet Take 1 tablet (40 mg total) by mouth at bedtime. Or morning on empty stomach 1 tablet 0   potassium chloride (KLOR-CON) 10 MEQ tablet Take 1 tablet (10 mEq total) by mouth daily. 90 tablet 3   simvastatin (ZOCOR) 40 MG tablet Take 40 mg by mouth every evening.     tamsulosin (FLOMAX) 0.4 MG CAPS capsule TAKE 1 CAPSULE BY MOUTH ONCE DAILY (Patient taking differently: Take 0.4 mg by mouth at bedtime.) 90 capsule 3   traMADol (ULTRAM) 50 MG tablet TAKE 1  TABLET BY MOUTH ONCE A DAY AS NEEDED FOR PAIN 30 tablet 0   No current facility-administered medications on file prior to visit.    Allergies  Allergen Reactions   Sulfa Antibiotics Rash   Benzalkonium Chloride     "Benzalkonium chloride is a quaternary ammonium antiseptic and disinfectant with actions and uses similar to those of other cationic surfactants. It is also used as an antimicrobial preservative for pharmaceutical products."   Neosporin [Neomycin-Bacitracin Zn-Polymyx]    Augmentin [Amoxicillin-Pot Clavulanate] Rash   Terbinafine Rash   Terbinafine And Related Rash    Past Medical History:  Diagnosis Date   Asthma    BPH (benign prostatic hypertrophy)    Cataract    Chronic renal disease, stage III (HCC)    Chronic sinusitis    COPD (chronic obstructive pulmonary disease) (HCC)    Coronary artery disease    2 stents   Gall stones    Hearing loss    DOES NOT WEAR HEARING AIDS   High cholesterol    Hypertension    Macular degeneration    right eye    Past Surgical History:  Procedure Laterality Date   APPENDECTOMY     CORONARY ANGIOPLASTY WITH STENT PLACEMENT  1998   2 stents   TRANSURETHRAL RESECTION OF PROSTATE      Family History  Problem Relation Age of Onset   Diabetes Father    Heart  disease Brother     Social History   Socioeconomic History   Marital status: Married    Spouse name: Not on file   Number of children: 4   Years of education: Not on file   Highest education level: Not on file  Occupational History   Occupation: Drove truck and concrete work  Tobacco Use   Smoking status: Former    Types: Cigarettes    Quit date: 08/13/2005    Years since quitting: 15.9   Smokeless tobacco: Current    Types: Chew   Tobacco comments:    discussed stopping this (only once in a while)  Vaping Use   Vaping Use: Never used  Substance and Sexual Activity   Alcohol use: No   Drug use: No   Sexual activity: Not on file  Other Topics Concern    Not on file  Social History Narrative   No living will   Wife, then [place] daughter should make decisions   Would accept resuscitation   Would probably accept tube feeds   Social Determinants of Health   Financial Resource Strain: Not on file  Food Insecurity: Not on file  Transportation Needs: Not on file  Physical Activity: Not on file  Stress: Not on file  Social Connections: Not on file  Intimate Partner Violence: Not on file   Review of Systems No recent diarrhea Eating well    Objective:   Physical Exam Constitutional:      Appearance: Normal appearance.  Cardiovascular:     Rate and Rhythm: Normal rate and regular rhythm.     Heart sounds: No murmur heard.   No gallop.     Comments: Regular with frequent ectopy Musculoskeletal:     Cervical back: Neck supple.  Lymphadenopathy:     Cervical: No cervical adenopathy.  Neurological:     Mental Status: He is alert.           Assessment & Plan:

## 2021-07-20 ENCOUNTER — Telehealth: Payer: Self-pay

## 2021-07-20 ENCOUNTER — Telehealth: Payer: Self-pay | Admitting: Internal Medicine

## 2021-07-20 MED ORDER — VANCOMYCIN HCL 125 MG PO CAPS: 125.0000 mg | ORAL_CAPSULE | Freq: Four times a day (QID) | ORAL | 0 refills | Status: DC

## 2021-07-20 NOTE — Telephone Encounter (Signed)
Let her know I sent in vancomycin for extended time since they can't afford the fidaximicin. 4 times a day for 2 weeks, then 2 per day for a week, 1 per day for a week, then every other day for another 8 weeks. (Instructions will be on the bottle)

## 2021-07-20 NOTE — Telephone Encounter (Signed)
See other telephone note.  

## 2021-07-20 NOTE — Telephone Encounter (Signed)
Pts wife called back. I gave the information. She was very thankful.

## 2021-07-20 NOTE — Telephone Encounter (Signed)
Spoke to pt's wife as he was placed on Dr Alla German schedule tomorrow for a phone visit. She said he is having constant diarrhea, fever. He is getting Pedialyte and Ensure. Asking if something can be called in to [facility].

## 2021-07-20 NOTE — Telephone Encounter (Signed)
Pt is returning call.  

## 2021-07-20 NOTE — Telephone Encounter (Signed)
I have tried to call the home number several times but the call cannot be completed. I have called the cell phone several times and there is no VM to leave a message.

## 2021-07-21 ENCOUNTER — Telehealth: Payer: PPO | Admitting: Internal Medicine

## 2021-07-24 ENCOUNTER — Encounter (HOSPITAL_COMMUNITY): Payer: Self-pay | Admitting: Emergency Medicine

## 2021-07-24 ENCOUNTER — Emergency Department (HOSPITAL_COMMUNITY): Payer: PPO

## 2021-07-24 ENCOUNTER — Inpatient Hospital Stay (HOSPITAL_COMMUNITY)
Admission: EM | Admit: 2021-07-24 | Discharge: 2021-07-29 | DRG: 682 | Disposition: A | Discharge status: Home Health | Payer: PPO | Attending: Internal Medicine | Admitting: Internal Medicine

## 2021-07-24 DIAGNOSIS — I129 Hypertensive chronic kidney disease with stage 1 through stage 4 chronic kidney disease, or unspecified chronic kidney disease: Secondary | ICD-10-CM | POA: Diagnosis present

## 2021-07-24 DIAGNOSIS — N179 Acute kidney failure, unspecified: Secondary | ICD-10-CM | POA: Diagnosis not present

## 2021-07-24 DIAGNOSIS — K219 Gastro-esophageal reflux disease without esophagitis: Secondary | ICD-10-CM | POA: Diagnosis not present

## 2021-07-24 DIAGNOSIS — J811 Chronic pulmonary edema: Secondary | ICD-10-CM | POA: Diagnosis not present

## 2021-07-24 DIAGNOSIS — Z20822 Contact with and (suspected) exposure to covid-19: Secondary | ICD-10-CM | POA: Diagnosis present

## 2021-07-24 DIAGNOSIS — I2510 Atherosclerotic heart disease of native coronary artery without angina pectoris: Secondary | ICD-10-CM | POA: Diagnosis not present

## 2021-07-24 DIAGNOSIS — N400 Enlarged prostate without lower urinary tract symptoms: Secondary | ICD-10-CM | POA: Diagnosis not present

## 2021-07-24 DIAGNOSIS — G9341 Metabolic encephalopathy: Secondary | ICD-10-CM | POA: Diagnosis not present

## 2021-07-24 DIAGNOSIS — M4317 Spondylolisthesis, lumbosacral region: Secondary | ICD-10-CM | POA: Diagnosis not present

## 2021-07-24 DIAGNOSIS — N1832 Chronic kidney disease, stage 3b: Secondary | ICD-10-CM | POA: Diagnosis present

## 2021-07-24 DIAGNOSIS — R0902 Hypoxemia: Secondary | ICD-10-CM | POA: Diagnosis not present

## 2021-07-24 DIAGNOSIS — A0471 Enterocolitis due to Clostridium difficile, recurrent: Secondary | ICD-10-CM | POA: Diagnosis not present

## 2021-07-24 DIAGNOSIS — E162 Hypoglycemia, unspecified: Secondary | ICD-10-CM | POA: Diagnosis not present

## 2021-07-24 DIAGNOSIS — E785 Hyperlipidemia, unspecified: Secondary | ICD-10-CM | POA: Diagnosis present

## 2021-07-24 DIAGNOSIS — E876 Hypokalemia: Secondary | ICD-10-CM | POA: Diagnosis present

## 2021-07-24 DIAGNOSIS — E872 Acidosis, unspecified: Secondary | ICD-10-CM | POA: Diagnosis not present

## 2021-07-24 DIAGNOSIS — D496 Neoplasm of unspecified behavior of brain: Secondary | ICD-10-CM | POA: Diagnosis present

## 2021-07-24 DIAGNOSIS — D649 Anemia, unspecified: Secondary | ICD-10-CM | POA: Diagnosis present

## 2021-07-24 DIAGNOSIS — R0689 Other abnormalities of breathing: Secondary | ICD-10-CM | POA: Diagnosis not present

## 2021-07-24 DIAGNOSIS — J449 Chronic obstructive pulmonary disease, unspecified: Secondary | ICD-10-CM | POA: Diagnosis not present

## 2021-07-24 DIAGNOSIS — Z66 Do not resuscitate: Secondary | ICD-10-CM | POA: Diagnosis not present

## 2021-07-24 DIAGNOSIS — G935 Compression of brain: Secondary | ICD-10-CM | POA: Diagnosis present

## 2021-07-24 DIAGNOSIS — R197 Diarrhea, unspecified: Secondary | ICD-10-CM | POA: Diagnosis present

## 2021-07-24 DIAGNOSIS — Z833 Family history of diabetes mellitus: Secondary | ICD-10-CM

## 2021-07-24 DIAGNOSIS — N183 Chronic kidney disease, stage 3 unspecified: Secondary | ICD-10-CM | POA: Diagnosis not present

## 2021-07-24 DIAGNOSIS — I517 Cardiomegaly: Secondary | ICD-10-CM | POA: Diagnosis not present

## 2021-07-24 DIAGNOSIS — Z7951 Long term (current) use of inhaled steroids: Secondary | ICD-10-CM

## 2021-07-24 DIAGNOSIS — E7800 Pure hypercholesterolemia, unspecified: Secondary | ICD-10-CM | POA: Diagnosis present

## 2021-07-24 DIAGNOSIS — Z87891 Personal history of nicotine dependence: Secondary | ICD-10-CM | POA: Diagnosis not present

## 2021-07-24 DIAGNOSIS — Z9049 Acquired absence of other specified parts of digestive tract: Secondary | ICD-10-CM | POA: Diagnosis not present

## 2021-07-24 DIAGNOSIS — Z955 Presence of coronary angioplasty implant and graft: Secondary | ICD-10-CM | POA: Diagnosis not present

## 2021-07-24 DIAGNOSIS — N200 Calculus of kidney: Secondary | ICD-10-CM | POA: Diagnosis not present

## 2021-07-24 DIAGNOSIS — Z7982 Long term (current) use of aspirin: Secondary | ICD-10-CM | POA: Diagnosis not present

## 2021-07-24 DIAGNOSIS — A0472 Enterocolitis due to Clostridium difficile, not specified as recurrent: Secondary | ICD-10-CM | POA: Diagnosis not present

## 2021-07-24 DIAGNOSIS — R4182 Altered mental status, unspecified: Secondary | ICD-10-CM

## 2021-07-24 DIAGNOSIS — Z79899 Other long term (current) drug therapy: Secondary | ICD-10-CM

## 2021-07-24 DIAGNOSIS — Z8249 Family history of ischemic heart disease and other diseases of the circulatory system: Secondary | ICD-10-CM | POA: Diagnosis not present

## 2021-07-24 DIAGNOSIS — R1030 Lower abdominal pain, unspecified: Secondary | ICD-10-CM | POA: Diagnosis not present

## 2021-07-24 DIAGNOSIS — R404 Transient alteration of awareness: Secondary | ICD-10-CM | POA: Diagnosis not present

## 2021-07-24 DIAGNOSIS — R000 Tachycardia, unspecified: Secondary | ICD-10-CM | POA: Diagnosis not present

## 2021-07-24 DIAGNOSIS — D3610 Benign neoplasm of peripheral nerves and autonomic nervous system, unspecified: Secondary | ICD-10-CM | POA: Diagnosis present

## 2021-07-24 DIAGNOSIS — I700 Atherosclerosis of aorta: Secondary | ICD-10-CM | POA: Diagnosis not present

## 2021-07-24 LAB — PROTIME-INR
INR: 1.1 (ref 0.8–1.2)
Prothrombin Time: 14.1 s (ref 11.4–15.2)

## 2021-07-24 LAB — URINALYSIS, ROUTINE W REFLEX MICROSCOPIC
Bacteria, UA: NONE SEEN
Bilirubin Urine: NEGATIVE
Glucose, UA: NEGATIVE mg/dL
Ketones, ur: NEGATIVE mg/dL
Leukocytes,Ua: NEGATIVE
Nitrite: NEGATIVE
Protein, ur: 30 mg/dL — AB
Specific Gravity, Urine: 1.027 (ref 1.005–1.030)
pH: 5 (ref 5.0–8.0)

## 2021-07-24 LAB — RAPID URINE DRUG SCREEN, HOSP PERFORMED
Amphetamines: NOT DETECTED
Barbiturates: NOT DETECTED
Benzodiazepines: NOT DETECTED
Cocaine: NOT DETECTED
Opiates: NOT DETECTED
Tetrahydrocannabinol: NOT DETECTED

## 2021-07-24 LAB — CBC WITH DIFFERENTIAL/PLATELET
Abs Immature Granulocytes: 0.8 10*3/uL — ABNORMAL HIGH (ref 0.00–0.07)
Basophils Absolute: 0.1 10*3/uL (ref 0.0–0.1)
Basophils Relative: 1 %
Eosinophils Absolute: 0.2 10*3/uL (ref 0.0–0.5)
Eosinophils Relative: 2 %
HCT: 34.6 % — ABNORMAL LOW (ref 39.0–52.0)
Hemoglobin: 10.9 g/dL — ABNORMAL LOW (ref 13.0–17.0)
Lymphocytes Relative: 31 %
Lymphs Abs: 3.2 10*3/uL (ref 0.7–4.0)
MCH: 28 pg (ref 26.0–34.0)
MCHC: 31.5 g/dL (ref 30.0–36.0)
MCV: 88.9 fL (ref 80.0–100.0)
Metamyelocytes Relative: 3 %
Monocytes Absolute: 1.1 10*3/uL — ABNORMAL HIGH (ref 0.1–1.0)
Monocytes Relative: 11 %
Myelocytes: 3 %
Neutro Abs: 4.8 10*3/uL (ref 1.7–7.7)
Neutrophils Relative %: 47 %
Platelets: 287 10*3/uL (ref 150–400)
Promyelocytes Relative: 2 %
RBC: 3.89 MIL/uL — ABNORMAL LOW (ref 4.22–5.81)
RDW: 15.4 % (ref 11.5–15.5)
WBC: 10.2 10*3/uL (ref 4.0–10.5)
nRBC: 0 % (ref 0.0–0.2)
nRBC: 0 /100 WBC

## 2021-07-24 LAB — COMPREHENSIVE METABOLIC PANEL
ALT: 23 U/L (ref 0–44)
AST: 30 U/L (ref 15–41)
Albumin: 3.2 g/dL — ABNORMAL LOW (ref 3.5–5.0)
Alkaline Phosphatase: 73 U/L (ref 38–126)
Anion gap: 9 (ref 5–15)
BUN: 16 mg/dL (ref 8–23)
CO2: 24 mmol/L (ref 22–32)
Calcium: 8.8 mg/dL — ABNORMAL LOW (ref 8.9–10.3)
Chloride: 106 mmol/L (ref 98–111)
Creatinine, Ser: 1.21 mg/dL (ref 0.61–1.24)
GFR, Estimated: 58 mL/min — ABNORMAL LOW (ref >60)
Glucose, Bld: 86 mg/dL (ref 70–99)
Potassium: 2.6 mmol/L — CL (ref 3.5–5.1)
Sodium: 139 mmol/L (ref 135–145)
Total Bilirubin: 0.6 mg/dL (ref 0.3–1.2)
Total Protein: 6.6 g/dL (ref 6.5–8.1)

## 2021-07-24 LAB — APTT: aPTT: 39 seconds — ABNORMAL HIGH (ref 24–36)

## 2021-07-24 LAB — I-STAT CHEM 8, ED
BUN: 17 mg/dL (ref 8–23)
Calcium, Ion: 1.24 mmol/L (ref 1.15–1.40)
Chloride: 106 mmol/L (ref 98–111)
Creatinine, Ser: 1.2 mg/dL (ref 0.61–1.24)
Glucose, Bld: 91 mg/dL (ref 70–99)
HCT: 32 % — ABNORMAL LOW (ref 39.0–52.0)
Hemoglobin: 10.9 g/dL — ABNORMAL LOW (ref 13.0–17.0)
Potassium: 2.6 mmol/L — CL (ref 3.5–5.1)
Sodium: 143 mmol/L (ref 135–145)
TCO2: 25 mmol/L (ref 22–32)

## 2021-07-24 LAB — RESP PANEL BY RT-PCR (FLU A&B, COVID) ARPGX2
Influenza A by PCR: NEGATIVE
Influenza B by PCR: NEGATIVE
SARS Coronavirus 2 by RT PCR: NEGATIVE

## 2021-07-24 LAB — C DIFFICILE QUICK SCREEN W PCR REFLEX
C Diff antigen: NEGATIVE
C Diff interpretation: NOT DETECTED
C Diff toxin: NEGATIVE

## 2021-07-24 LAB — ETHANOL: Alcohol, Ethyl (B): <10 mg/dL (ref <10)

## 2021-07-24 LAB — MAGNESIUM: Magnesium: 1.8 mg/dL (ref 1.7–2.4)

## 2021-07-24 LAB — LACTIC ACID, PLASMA: Lactic Acid, Venous: 1.8 mmol/L (ref 0.5–1.9)

## 2021-07-24 MED ORDER — SIMVASTATIN 20 MG PO TABS
40.0000 mg | ORAL_TABLET | Freq: Every evening | ORAL | Status: DC
  Administered 2021-07-24 – 2021-07-28 (×5): 40 mg via ORAL
  Filled 2021-07-24 (×5): qty 2

## 2021-07-24 MED ORDER — POTASSIUM CHLORIDE 10 MEQ/100ML IV SOLN
10.0000 meq | INTRAVENOUS | Status: AC
  Administered 2021-07-24 (×4): 10 meq via INTRAVENOUS
  Filled 2021-07-24 (×4): qty 100

## 2021-07-24 MED ORDER — HALOPERIDOL LACTATE 5 MG/ML IJ SOLN
2.0000 mg | Freq: Once | INTRAMUSCULAR | Status: AC
  Administered 2021-07-24: 2 mg via INTRAVENOUS
  Filled 2021-07-24: qty 1

## 2021-07-24 MED ORDER — MOMETASONE FURO-FORMOTEROL FUM 200-5 MCG/ACT IN AERO
2.0000 | INHALATION_SPRAY | Freq: Two times a day (BID) | RESPIRATORY_TRACT | Status: DC
  Administered 2021-07-25 – 2021-07-29 (×9): 2 via RESPIRATORY_TRACT
  Filled 2021-07-24 (×2): qty 8.8

## 2021-07-24 MED ORDER — VANCOMYCIN HCL 125 MG PO CAPS: 125.0000 mg | ORAL_CAPSULE | ORAL | Status: DC

## 2021-07-24 MED ORDER — VANCOMYCIN HCL 125 MG PO CAPS
125.0000 mg | ORAL_CAPSULE | Freq: Four times a day (QID) | ORAL | Status: DC
  Administered 2021-07-24 – 2021-07-29 (×18): 125 mg via ORAL
  Filled 2021-07-24 (×22): qty 1

## 2021-07-24 MED ORDER — ACETAMINOPHEN 650 MG RE SUPP: 650.0000 mg | Freq: Four times a day (QID) | RECTAL | Status: DC | PRN

## 2021-07-24 MED ORDER — MORPHINE SULFATE (PF) 2 MG/ML IV SOLN: 2.0000 mg | INTRAVENOUS | Status: DC | PRN

## 2021-07-24 MED ORDER — ONDANSETRON HCL 4 MG/2ML IJ SOLN: 4.0000 mg | Freq: Four times a day (QID) | INTRAMUSCULAR | Status: DC | PRN

## 2021-07-24 MED ORDER — ASPIRIN EC 81 MG PO TBEC
81.0000 mg | DELAYED_RELEASE_TABLET | Freq: Every day | ORAL | Status: DC
  Administered 2021-07-24 – 2021-07-29 (×6): 81 mg via ORAL
  Filled 2021-07-24 (×6): qty 1

## 2021-07-24 MED ORDER — ENOXAPARIN SODIUM 40 MG/0.4ML IJ SOSY
40.0000 mg | PREFILLED_SYRINGE | INTRAMUSCULAR | Status: DC
  Administered 2021-07-24 – 2021-07-28 (×5): 40 mg via SUBCUTANEOUS
  Filled 2021-07-24 (×5): qty 0.4

## 2021-07-24 MED ORDER — PANTOPRAZOLE SODIUM 40 MG PO TBEC
40.0000 mg | DELAYED_RELEASE_TABLET | Freq: Every day | ORAL | Status: DC
  Administered 2021-07-24 – 2021-07-29 (×6): 40 mg via ORAL
  Filled 2021-07-24 (×6): qty 1

## 2021-07-24 MED ORDER — GABAPENTIN 300 MG PO CAPS
300.0000 mg | ORAL_CAPSULE | Freq: Every day | ORAL | Status: DC
  Administered 2021-07-24 – 2021-07-28 (×5): 300 mg via ORAL
  Filled 2021-07-24 (×5): qty 1

## 2021-07-24 MED ORDER — HYDRALAZINE HCL 20 MG/ML IJ SOLN: 5.0000 mg | INTRAMUSCULAR | Status: DC | PRN

## 2021-07-24 MED ORDER — LACTATED RINGERS IV SOLN: INTRAVENOUS | Status: DC

## 2021-07-24 MED ORDER — TAMSULOSIN HCL 0.4 MG PO CAPS
0.4000 mg | ORAL_CAPSULE | Freq: Every day | ORAL | Status: DC
  Administered 2021-07-24 – 2021-07-28 (×5): 0.4 mg via ORAL
  Filled 2021-07-24 (×5): qty 1

## 2021-07-24 MED ORDER — ACETAMINOPHEN 325 MG PO TABS
650.0000 mg | ORAL_TABLET | Freq: Four times a day (QID) | ORAL | Status: DC | PRN
  Administered 2021-07-27: 650 mg via ORAL
  Filled 2021-07-24: qty 2

## 2021-07-24 MED ORDER — POTASSIUM CHLORIDE CRYS ER 20 MEQ PO TBCR
40.0000 meq | EXTENDED_RELEASE_TABLET | Freq: Once | ORAL | Status: AC
  Administered 2021-07-24: 40 meq via ORAL
  Filled 2021-07-24: qty 2

## 2021-07-24 MED ORDER — OXYCODONE HCL 5 MG PO TABS: 5.0000 mg | ORAL_TABLET | ORAL | Status: DC | PRN

## 2021-07-24 MED ORDER — ONDANSETRON HCL 4 MG PO TABS: 4.0000 mg | ORAL_TABLET | Freq: Four times a day (QID) | ORAL | Status: DC | PRN

## 2021-07-24 MED ORDER — SODIUM CHLORIDE 0.9% FLUSH
3.0000 mL | Freq: Two times a day (BID) | INTRAVENOUS | Status: DC
  Administered 2021-07-24 – 2021-07-29 (×8): 3 mL via INTRAVENOUS

## 2021-07-24 MED ORDER — RISAQUAD PO CAPS
1.0000 | ORAL_CAPSULE | Freq: Every day | ORAL | Status: DC
  Administered 2021-07-24 – 2021-07-29 (×6): 1 via ORAL
  Filled 2021-07-24 (×6): qty 1

## 2021-07-24 MED ORDER — VANCOMYCIN HCL 125 MG PO CAPS: 125.0000 mg | ORAL_CAPSULE | Freq: Every day | ORAL | Status: DC

## 2021-07-24 MED ORDER — TRAMADOL HCL 50 MG PO TABS
50.0000 mg | ORAL_TABLET | Freq: Every day | ORAL | Status: DC | PRN
  Administered 2021-07-27: 50 mg via ORAL
  Filled 2021-07-24: qty 1

## 2021-07-24 MED ORDER — VANCOMYCIN HCL 125 MG PO CAPS: 125.0000 mg | ORAL_CAPSULE | Freq: Two times a day (BID) | ORAL | Status: DC

## 2021-07-24 NOTE — ED Notes (Addendum)
CT transporter informed this RN that CT was not obtained due to pt repeatedly sitting up and attempting to climb off the CT table. Dr. Betsey Holiday made aware

## 2021-07-24 NOTE — ED Triage Notes (Addendum)
Pt bib EMS from home for AMS x24 hours. Displaying dysphagia, hallucinations of people in room with him. Recently was prescribed vancomycin for a fever. Afebrile with EMS. HR/rhythm going between tachycardia and bradycardia, per EMS staff. Arrives to ED with translucent cardiac pads for this reason.  Hx of a fib, no blood thinner use (past or present). 100-200 NS given by EMS.   18G in L AC by EMS

## 2021-07-24 NOTE — ED Notes (Signed)
Plan to administer IV haldol immediately prior to pt's transport back to CT per previous unsuccessful attempt at imaging.

## 2021-07-24 NOTE — ED Notes (Signed)
Patient transported to CT 

## 2021-07-24 NOTE — ED Notes (Signed)
Dr. Betsey Holiday made aware of critical potassium level 2.6

## 2021-07-24 NOTE — ED Notes (Signed)
Pt pulled IV out. Removed self from monitor. Sitter requested.

## 2021-07-24 NOTE — H&P (Signed)
History and Physical    Leon Estes YTK:[id number] DOB: 25-Aug-1930 DOA: 07/24/2021  PCP: Venia Carbon, MD Consultants:  [facility] - cardiology Patient coming from:  Home - lives with wife; NOK: Wife, Logyn Kendrick, [phone], [phone]  Chief Complaint: AMS  HPI: Leon Estes is a 87 y.o. male with medical history significant of BPH; stage 3 CKD; COPD; CAD; HTN; HLD; recent diagnosis of Schwannoma; and recurrent C diff coliitis presenting with AMS, somnolence. He is currently more awake and alert and able to report history of recent diarrhea, taking medication for this.  He denies current pain.  He is not sure if he has been confused, denies hallucinations.  His wife reports that he "has been out of it" since Saturday evening.  He was "like a crazy person" Saturday night and all day Sunday.  She thought maybe it was related to the antibiotic.  He had terrible diarrhea - he had fever and chills Tuesday night.  He was started on the antibiotic with some improvement.  He wasn't eating much, hardly able to feed himself since Friday.  She was trying to get him to drink Ensure or Pedialyte.      ED Course: C diff in September.  Now here with AMS, somnolence.  Fever, chills, diarrhea 4-5 days ago.  PCP called in Vanc but he continued to have diarrhea and no sleep.  Given Seroquel one time yesterday AM, visual hallucinations yesterday, now very somnolent.  No diarrhea here.  ?Seroquel, fatigue.  Review of Systems: As per HPI; otherwise review of systems reviewed and negative.  Limited by AMS.  Ambulatory Status:  Ambulates with a walker  COVID Vaccine Status:  Complete  Past Medical History:  Diagnosis Date   Asthma    BPH (benign prostatic hypertrophy)    Cataract    Chronic renal disease, stage III (HCC)    Chronic sinusitis    COPD (chronic obstructive pulmonary disease) (HCC)    Coronary artery disease    2 stents   Gall stones    Hearing loss    DOES NOT WEAR HEARING  AIDS   High cholesterol    Hypertension    Macular degeneration    right eye    Past Surgical History:  Procedure Laterality Date   APPENDECTOMY     CORONARY ANGIOPLASTY WITH STENT PLACEMENT  1998   2 stents   TRANSURETHRAL RESECTION OF PROSTATE      Social History   Socioeconomic History   Marital status: Married    Spouse name: Not on file   Number of children: 4   Years of education: Not on file   Highest education level: Not on file  Occupational History   Occupation: Drove truck and concrete work  Tobacco Use   Smoking status: Former    Types: Cigarettes    Quit date: 08/13/2005    Years since quitting: 15.9   Smokeless tobacco: Current    Types: Chew   Tobacco comments:    discussed stopping this (only once in a while)  Vaping Use   Vaping Use: Never used  Substance and Sexual Activity   Alcohol use: No   Drug use: No   Sexual activity: Not on file  Other Topics Concern   Not on file  Social History Narrative   No living will   Wife, then [place] daughter should make decisions   Would accept resuscitation   Would probably accept tube feeds   Social Determinants of Health  Financial Resource Strain: Not on file  Food Insecurity: Not on file  Transportation Needs: Not on file  Physical Activity: Not on file  Stress: Not on file  Social Connections: Not on file  Intimate Partner Violence: Not on file    Allergies  Allergen Reactions   Sulfa Antibiotics Rash   Benzalkonium Chloride     "Benzalkonium chloride is a quaternary ammonium antiseptic and disinfectant with actions and uses similar to those of other cationic surfactants. It is also used as an antimicrobial preservative for pharmaceutical products."   Neosporin [Neomycin-Bacitracin Zn-Polymyx]    Augmentin [Amoxicillin-Pot Clavulanate] Rash   Terbinafine Rash   Terbinafine And Related Rash    Family History  Problem Relation Age of Onset   Diabetes Father    Heart disease Brother      Prior to Admission medications   Medication Sig Start Date End Date Taking? Authorizing Provider  acetaminophen (TYLENOL) 500 MG tablet Take 1,000 mg by mouth every 6 (six) hours as needed for moderate pain or headache.   Yes [provider]  aspirin EC 81 MG tablet Take 81 mg by mouth daily.   Yes [provider]  budesonide-formoterol (SYMBICORT) 160-4.5 MCG/ACT inhaler INHALE 2 PUFFS INTO THE LUNGS TWICE DAILY Patient taking differently: Inhale 2 puffs into the lungs in the morning and at bedtime. 09/28/20  Yes Venia Carbon, MD  gabapentin (NEURONTIN) 300 MG capsule TAKE 1 CAPSULE BY MOUTH ONCE DAILY Patient taking differently: Take 300 mg by mouth at bedtime. 07/13/21  Yes Venia Carbon, MD  Multiple Vitamin (MULTIVITAMIN WITH MINERALS) TABS tablet Take 1 tablet by mouth daily. 01/23/21  Yes Georgette Shell, MD  Multiple Vitamins-Minerals (ONE-A-DAY MENS 50+) TABS Take 1 tablet by mouth daily.   Yes [provider]  pantoprazole (PROTONIX) 40 MG tablet Take 1 tablet (40 mg total) by mouth at bedtime. Or morning on empty stomach Patient taking differently: Take 40 mg by mouth daily before breakfast. 05/04/21  Yes Venia Carbon, MD  potassium chloride (KLOR-CON) 10 MEQ tablet Take 1 tablet (10 mEq total) by mouth daily. 03/15/21  Yes Venia Carbon, MD  Probiotic Product (PROBIOTIC DAILY PO) Take 1 capsule by mouth daily.   Yes [provider]  simvastatin (ZOCOR) 40 MG tablet Take 40 mg by mouth every evening.   Yes [provider]  tamsulosin (FLOMAX) 0.4 MG CAPS capsule TAKE 1 CAPSULE BY MOUTH ONCE DAILY Patient taking differently: Take 0.4 mg by mouth at bedtime. 07/14/20  Yes Venia Carbon, MD  traMADol (ULTRAM) 50 MG tablet TAKE 1 TABLET BY MOUTH ONCE A DAY AS NEEDED FOR PAIN Patient taking differently: Take 50 mg by mouth daily as needed for moderate pain. 06/05/21  Yes Venia Carbon, MD  vancomycin (VANCOCIN) 125 MG  capsule Take 1 capsule (125 mg total) by mouth 4 (four) times daily. For 2 weeks. Then twice a day for 1 week, then daily for 1 week. Finally, continue it every other day for 8 weeks Patient taking differently: Take 125 mg by mouth See admin instructions. 1 capsule qid x 2 weeks, then 1 capsule bid x 1 week, then 1 capsule qd x 1 week, then 1 capsule every other day x 8 weeks. 07/20/21  Yes Viviana Simpler I, MD  nystatin cream (MYCOSTATIN) APPLY 1 APPLICATION TO AFFECTED AREA 2 TIMES DAILY Patient not taking: Reported on 07/24/2021 05/26/21   Venia Carbon, MD    Physical Exam: Vitals:  07/24/21 1515 07/24/21 1530 07/24/21 1544 07/24/21 1645  BP: (!) 161/85 (!) 162/79  (!) 171/83  Pulse: 91 89 (!) 115 95  Resp:   (!) 22 15  Temp:      TempSrc:      SpO2: 97% 97% 96% 96%  Weight:      Height:         General:  Appears calm and comfortable and is in NAD, mildly somnolent/confused but clearly improved from prior Eyes:  PERRL, EOMI, normal lids, iris ENT:  grossly normal hearing, lips & tongue, mildly dry mm Neck:  no LAD, masses or thyromegaly Cardiovascular:  RRR, no m/r/g. No LE edema.  Respiratory:   CTA bilaterally with no wheezes/rales/rhonchi.  Normal respiratory effort. Abdomen:  soft, NT, ND Skin:  no rash or induration seen on limited exam Musculoskeletal:  grossly normal tone BUE/BLE, good ROM, no bony abnormality Psychiatric:  mildly somnolent/confused mood and affect, speech sparse but appropriate Neurologic:  CN 2-12 grossly intact, moves all extremities in coordinated fashion    Radiological Exams on Admission: Independently reviewed - see discussion in A/P where applicable  CT HEAD WO CONTRAST  Result Date: 07/24/2021 CLINICAL DATA:  Mental status change, unknown cause. EXAM: CT HEAD WITHOUT CONTRAST TECHNIQUE: Contiguous axial images were obtained from the base of the skull through the vertex without intravenous contrast. COMPARISON:  04/18/2021. FINDINGS:  Brain: No acute intracranial hemorrhage, midline shift or mass effect. No extra-axial fluid collection. Diffuse atrophy is noted. Subcortical and periventricular white matter hypodensities are present bilaterally. There is no hydrocephalus. Vascular: No hyperdense vessel or unexpected calcification. Skull: No definite fracture. Sinuses/Orbits: There is mucosal thickening in the left maxillary sinus. Partial opacification of the ethmoid sinuses bilaterally. No acute orbital abnormality. Other: Examination is limited due to motion artifact. IMPRESSION: 1. No acute intracranial hemorrhage. 2. Atrophy with chronic microvascular ischemic changes. 3. 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 Electronically Signed   By: Brett Fairy M.D.   On: 07/24/2021 04:49   DG Chest Port 1 View  Result Date: 07/24/2021 CLINICAL DATA:  Possible sepsis.  Altered mental status. EXAM: PORTABLE CHEST 1 VIEW COMPARISON:  04/29/2021. FINDINGS: The heart mildly enlarged and pulmonary vascular congestion is noted. There is mild atherosclerotic calcification of the aorta. No consolidation, effusion, or pneumothorax. No acute osseous abnormality. IMPRESSION: Cardiomegaly with mildly distended pulmonary vasculature. Electronically Signed   By: Brett Fairy M.D.   On: 07/24/2021 03:36    EKG: Independently reviewed.  NSR with rate 97; bigeminy, RBBB   Labs on Admission: I have personally reviewed the available labs and imaging studies at the time of the admission.  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 structures [facility])    AMS -Patient with similar recent presentation -At that time, he had recurrent C diff infection -Neuropsych testing was recommended for possible underlying dementia -He was given Seroquel -He developed recurrent diarrhea at home and was given Vanc by his PCP -He started having visual hallucinations after 1+ nights of not sleeping -His wife gave the Seroquel and he was exceedingly  somnolent upon arrival, although this was improving at the time of my evaluation -Will observation on telemetry overnight  Diarrhea -Prior h/o C diff in 12/2020 and then again during prior hospitalization -He improved but his wife noted recurrent diarrhea this week and so he had Vanc called in when he was unable to afford Dificid -C diff testing is currently negative; will complete course of Vanc as ordered -Note: he is recommended to have dysphagia 2 diet when no longer on clears  Hypokalemia -Repleted -Recheck BMP in AM  AKI -Mild, likely associated with diarrhea and poor PO intake -May have contributed to agitation  COPD -Continue Symbicort (Dulera per formulary)  HLD -Continue Zocor  Schwannoma -Recommended for outpatient neurosurgery f/u at the time of diagnoses in 03/2021 (no notes to indicate if this happened are currently available)      Note: This patient has been tested and is negative for the novel coronavirus COVID-19. The patient has been fully vaccinated against COVID-19.   Level of care: Telemetry Medical DVT prophylaxis:  Lovenox  Code Status: DNR - confirmed with ACP documents/family Family Communication: None present; I spoke with the patient's wife by telephone at the time of admission. Disposition Plan:  The patient is from: home  Anticipated d/c is to: home without [facility] services  Anticipated d/c date will depend on clinical response to treatment, but possibly as early as tomorrow if he has excellent response to treatment  Patient is  currently: acutely ill Consults called: None  Admission status:  It is my clinical opinion that referral for OBSERVATION is reasonable and necessary in this patient based on the above information provided. The aforementioned taken together are felt to place the patient at high risk for further clinical deterioration. However it is anticipated that the patient may be medically stable for discharge from the hospital within 24 to 48 hours.    Karmen Bongo MD [facility]   How to contact the [facility] Attending or Consulting provider [place] or covering provider during after hours [place], for this patient?  Check the care team in [facility] and look for a) attending/consulting [facility] provider listed and b) the [facility] team listed Log into [email or web address] and use [employer]'s universal password to access. If you do not have the password, please contact the hospital operator. Locate the [facility] provider you are looking for under [facility] and page to a number that you can be directly reached. If you still have difficulty reaching the provider, please page the [facility] (Director on Call) for the Hospitalists listed on amion for assistance.   07/24/2021, 5:52 PM

## 2021-07-24 NOTE — ED Provider Notes (Addendum)
[facility] EMERGENCY DEPARTMENT Provider Note   CSN: [id number] Arrival date & time: 07/24/21  0305     History Chief Complaint  Patient presents with   Altered Mental Status    Leon Estes is a 87 y.o. male.  Patient brought to the emergency department for evaluation of altered mental status.  This has apparently been ongoing for 24 hours.  Patient arrives in the emergency department by ambulance from home.  Very little information is available at this time.  Patient altered, somnolent, slurred speech, cannot answer questions appropriately.  Level 5 caveat due to altered mental status.  Family told EMS that the patient has been very altered for his norm.  He has been seeing people in the room with him.  He apparently had a televisit with a doctor and may have been prescribed an antibiotic for a fever.      Past Medical History:  Diagnosis Date   Asthma    BPH (benign prostatic hypertrophy)    Cataract    Chronic renal disease, stage III (HCC)    Chronic sinusitis    COPD (chronic obstructive pulmonary disease) (HCC)    Coronary artery disease    2 stents   Gall stones    Hearing loss    DOES NOT WEAR HEARING AIDS   High cholesterol    Hypertension    Macular degeneration    right eye    Patient Active Problem List   Diagnosis Date Noted   Palpitations 07/14/2021   Allergic rhinitis due to pollen 06/12/2021   GERD (gastroesophageal reflux disease) 00/86/7619   Toxic metabolic encephalopathy 50/93/2671   Neoplasm of brain causing mass effect on adjacent structures ([place]) 04/07/2021   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   Delirium 02/08/2021   Goals of care, counseling/discussion    Colitis due to Clostridioides difficile 12/31/2020   HOH (hard of hearing) 12/28/2020   Acute on chronic cholecystitis 12/28/2020   Arrhythmia 12/06/2020   Pulmonary nodule 12/06/2020   Preventative health care 10/12/2020   Macular  degeneration, wet ([place]) 10/12/2020   BPH (benign prostatic hyperplasia) 01/13/2020   Neuropathy 02/18/2018   COPD (chronic obstructive pulmonary disease) ([place])    Chronic renal disease, stage III ([place])    Benign essential HTN 04/09/2017   CAD (coronary artery disease) 03/03/2015   Hyperlipidemia 03/03/2015    Past Surgical History:  Procedure Laterality Date   APPENDECTOMY     CORONARY ANGIOPLASTY WITH STENT PLACEMENT  1998   2 stents   TRANSURETHRAL RESECTION OF PROSTATE         Family History  Problem Relation Age of Onset   Diabetes Father    Heart disease Brother     Social History   Tobacco Use   Smoking status: Former    Types: Cigarettes    Quit date: 08/13/2005    Years since quitting: 15.9   Smokeless tobacco: Current    Types: Chew   Tobacco comments:    discussed stopping this (only once in a while)  Vaping Use   Vaping Use: Never used  Substance Use Topics   Alcohol use: No   Drug use: No    Home Medications Prior to Admission medications   Medication Sig Start Date End Date Taking? Authorizing Provider  acetaminophen (TYLENOL) 500 MG tablet Take 1,000 mg by mouth every 6 (six) hours as needed for moderate pain or headache.    [provider]  aspirin EC 81 MG tablet Take  81 mg by mouth daily.    [provider]  budesonide-formoterol (SYMBICORT) 160-4.5 MCG/ACT inhaler INHALE 2 PUFFS INTO THE LUNGS TWICE DAILY Patient taking differently: Inhale 2 puffs into the lungs in the morning and at bedtime. 09/28/20   Venia Carbon, MD  gabapentin (NEURONTIN) 300 MG capsule TAKE 1 CAPSULE BY MOUTH ONCE DAILY 07/13/21   Venia Carbon, MD  Multiple Vitamin (MULTIVITAMIN WITH MINERALS) TABS tablet Take 1 tablet by mouth daily. 01/23/21   Georgette Shell, MD  nystatin cream (MYCOSTATIN) APPLY 1 APPLICATION TO AFFECTED AREA 2 TIMES DAILY 05/26/21   Venia Carbon, MD  pantoprazole (PROTONIX) 40 MG tablet Take 1 tablet (40 mg total) by  mouth at bedtime. Or morning on empty stomach 05/04/21   Venia Carbon, MD  potassium chloride (KLOR-CON) 10 MEQ tablet Take 1 tablet (10 mEq total) by mouth daily. 03/15/21   Venia Carbon, MD  simvastatin (ZOCOR) 40 MG tablet Take 40 mg by mouth every evening.    [provider]  tamsulosin (FLOMAX) 0.4 MG CAPS capsule TAKE 1 CAPSULE BY MOUTH ONCE DAILY Patient taking differently: Take 0.4 mg by mouth at bedtime. 07/14/20   Venia Carbon, MD  traMADol (ULTRAM) 50 MG tablet TAKE 1 TABLET BY MOUTH ONCE A DAY AS NEEDED FOR PAIN 06/05/21   Venia Carbon, MD  vancomycin (VANCOCIN) 125 MG capsule Take 1 capsule (125 mg total) by mouth 4 (four) times daily. For 2 weeks. Then twice a day for 1 week, then daily for 1 week. Finally, continue it every other day for 8 weeks 07/20/21   Venia Carbon, MD    Allergies    Sulfa antibiotics, Benzalkonium chloride, Neosporin [neomycin-bacitracin zn-polymyx], Augmentin [amoxicillin-pot clavulanate], Terbinafine, and Terbinafine and related  Review of Systems   Review of Systems  Unable to perform ROS: Mental status change   Physical Exam Updated Vital Signs BP (!) 143/98   Pulse 98   Temp 98.7 F (37.1 C) (Rectal)   Resp 19   Ht 6' (1.829 m)   Wt 72.6 kg   SpO2 97%   BMI 21.70 kg/m   Physical Exam Vitals and nursing note reviewed.  Constitutional:      General: He is not in acute distress.    Appearance: He is well-developed.  HENT:     Head: Normocephalic and atraumatic.     Right Ear: Hearing normal.     Left Ear: Hearing normal.     Nose: Nose normal.  Eyes:     Conjunctiva/sclera: Conjunctivae normal.     Pupils: Pupils are equal, round, and reactive to light.  Cardiovascular:     Rate and Rhythm: Regular rhythm. Tachycardia present.     Heart sounds: S1 normal and S2 normal. No murmur heard.   No friction rub. No gallop.  Pulmonary:     Effort: Pulmonary effort is normal. No respiratory distress.     Breath  sounds: Rhonchi present.  Chest:     Chest wall: No tenderness.  Abdominal:     General: Bowel sounds are normal. There is distension.     Palpations: Abdomen is soft.     Tenderness: There is no abdominal tenderness. There is no guarding or rebound. Negative signs include Murphy's sign and McBurney's sign.     Hernia: No hernia is present.  Musculoskeletal:        General: Normal range of motion.     Cervical back: Normal range of motion  and neck supple.     Right lower leg: Edema present.     Left lower leg: Edema present.  Skin:    General: Skin is warm and dry.     Findings: No rash.  Neurological:     Mental Status: He is lethargic.     Cranial Nerves: Cranial nerves 2-12 are intact. No cranial nerve deficit.     Sensory: No sensory deficit.     Motor: Motor function is intact.  Psychiatric:        Speech: Speech is slurred.    ED Results / Procedures / Treatments   Labs (all labs ordered are listed, but only abnormal results are displayed) Labs Reviewed  COMPREHENSIVE METABOLIC PANEL - Abnormal; Notable for the following components:      Result Value   Potassium 2.6 (*)    Calcium 8.8 (*)    Albumin 3.2 (*)    GFR, Estimated 58 (*)    All other components within normal limits  CBC WITH DIFFERENTIAL/PLATELET - Abnormal; Notable for the following components:   RBC 3.89 (*)    Hemoglobin 10.9 (*)    HCT 34.6 (*)    Monocytes Absolute 1.1 (*)    Abs Immature Granulocytes 0.80 (*)    All other components within normal limits  APTT - Abnormal; Notable for the following components:   aPTT 39 (*)    All other components within normal limits  URINALYSIS, ROUTINE W REFLEX MICROSCOPIC - Abnormal; Notable for the following components:   Hgb urine dipstick SMALL (*)    Protein, ur 30 (*)    All other components within normal limits  I-STAT CHEM 8, ED - Abnormal; Notable for the following components:   Potassium 2.6 (*)    Hemoglobin 10.9 (*)    HCT 32.0 (*)    All other  components within normal limits  RESP PANEL BY RT-PCR (FLU A&B, COVID) ARPGX2  CULTURE, BLOOD (ROUTINE X 2)  CULTURE, BLOOD (ROUTINE X 2)  URINE CULTURE  LACTIC ACID, PLASMA  PROTIME-INR  ETHANOL  MAGNESIUM  CBC  DIFFERENTIAL  RAPID URINE DRUG SCREEN, HOSP PERFORMED  I-STAT ARTERIAL BLOOD GAS, ED    EKG None  Radiology CT HEAD WO CONTRAST  Result Date: 07/24/2021 CLINICAL DATA:  Mental status change, unknown cause. EXAM: CT HEAD WITHOUT CONTRAST TECHNIQUE: Contiguous axial images were obtained from the base of the skull through the vertex without intravenous contrast. COMPARISON:  04/18/2021. FINDINGS: Brain: No acute intracranial hemorrhage, midline shift or mass effect. No extra-axial fluid collection. Diffuse atrophy is noted. Subcortical and periventricular white matter hypodensities are present bilaterally. There is no hydrocephalus. Vascular: No hyperdense vessel or unexpected calcification. Skull: No definite fracture. Sinuses/Orbits: There is mucosal thickening in the left maxillary sinus. Partial opacification of the ethmoid sinuses bilaterally. No acute orbital abnormality. Other: Examination is limited due to motion artifact. IMPRESSION: 1. No acute intracranial hemorrhage. 2. Atrophy with chronic microvascular ischemic changes. 3. 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 Electronically Signed   By: Brett Fairy M.D.   On: 07/24/2021 04:49   DG Chest Port 1 View  Result Date: 07/24/2021 CLINICAL DATA:  Possible sepsis.  Altered mental status. EXAM: PORTABLE CHEST 1 VIEW COMPARISON:  04/29/2021. FINDINGS: The heart mildly enlarged and pulmonary vascular congestion is noted. There is mild atherosclerotic calcification of the aorta. No consolidation, effusion, or pneumothorax. No acute osseous abnormality. IMPRESSION: Cardiomegaly with mildly distended pulmonary vasculature. Electronically Signed  By: Brett Fairy M.D.   On:  07/24/2021 03:36    Procedures Procedures   Medications Ordered in ED Medications  potassium chloride 10 mEq in 100 mL IVPB (10 mEq Intravenous New Bag/Given 07/24/21 0611)  haloperidol lactate (HALDOL) injection 2 mg (2 mg Intravenous Given 07/24/21 0424)    ED Course  I have reviewed the triage vital signs and the nursing notes.  Pertinent labs & imaging results that were available during my care of the patient were reviewed by me and considered in my medical decision making (see chart for details).    MDM Rules/Calculators/A&P                           Patient presents to the emergency department from home.  Patient has been altered for 24 hours.  He is unable to answer questions at arrival.  I was able to reach his wife at home.  Patient has a history of C. difficile.  Earlier this week he started having fever, chills and "constant diarrhea".  His primary care physician called in a prescription for oral vancomycin on the eighth and it was started.  Wife reports that he has not slept in days.  She did give him what sounds like Seroquel yesterday morning but he did not sleep all day.  Wife reports that he was given the Seroquel when he was in the hospital last time to help him sleep at night.  He does not normally take it.  Wife reports that he got more confused during the day and then started seeing people and cats in the room with him.    He will arouse to voice but falls back asleep immediately.  No focal findings on exam.  CT head without acute findings.  Will require hospitalization for further management of her altered mental status. Final Clinical Impression(s) / ED Diagnoses Final diagnoses:  Altered mental status, unspecified altered mental status type    Rx / DC Orders ED Discharge Orders     None        Shewanda Sharpe, Gwenyth Allegra, MD 07/24/21 1155    Orpah Greek, MD 07/24/21 [phone]

## 2021-07-24 NOTE — ED Notes (Signed)
Currently sleeping. New IV started. IV dressing reinforced with coban and ACE wrap. VSS.

## 2021-07-24 NOTE — ED Notes (Signed)
Unable to perform swallow screen at this time due to pt confusion & decreased ability to follow specific commands

## 2021-07-25 ENCOUNTER — Inpatient Hospital Stay (HOSPITAL_COMMUNITY): Payer: PPO

## 2021-07-25 DIAGNOSIS — D649 Anemia, unspecified: Secondary | ICD-10-CM | POA: Diagnosis not present

## 2021-07-25 DIAGNOSIS — J449 Chronic obstructive pulmonary disease, unspecified: Secondary | ICD-10-CM | POA: Diagnosis present

## 2021-07-25 DIAGNOSIS — Z79899 Other long term (current) drug therapy: Secondary | ICD-10-CM | POA: Diagnosis not present

## 2021-07-25 DIAGNOSIS — A0472 Enterocolitis due to Clostridium difficile, not specified as recurrent: Secondary | ICD-10-CM | POA: Diagnosis not present

## 2021-07-25 DIAGNOSIS — E876 Hypokalemia: Secondary | ICD-10-CM | POA: Diagnosis present

## 2021-07-25 DIAGNOSIS — M4317 Spondylolisthesis, lumbosacral region: Secondary | ICD-10-CM | POA: Diagnosis not present

## 2021-07-25 DIAGNOSIS — G9341 Metabolic encephalopathy: Secondary | ICD-10-CM | POA: Diagnosis present

## 2021-07-25 DIAGNOSIS — R1030 Lower abdominal pain, unspecified: Secondary | ICD-10-CM | POA: Diagnosis not present

## 2021-07-25 DIAGNOSIS — N179 Acute kidney failure, unspecified: Secondary | ICD-10-CM | POA: Diagnosis present

## 2021-07-25 DIAGNOSIS — E872 Acidosis, unspecified: Secondary | ICD-10-CM | POA: Diagnosis present

## 2021-07-25 DIAGNOSIS — Z8249 Family history of ischemic heart disease and other diseases of the circulatory system: Secondary | ICD-10-CM | POA: Diagnosis not present

## 2021-07-25 DIAGNOSIS — N1832 Chronic kidney disease, stage 3b: Secondary | ICD-10-CM | POA: Diagnosis present

## 2021-07-25 DIAGNOSIS — Z20822 Contact with and (suspected) exposure to covid-19: Secondary | ICD-10-CM | POA: Diagnosis not present

## 2021-07-25 DIAGNOSIS — Z955 Presence of coronary angioplasty implant and graft: Secondary | ICD-10-CM | POA: Diagnosis not present

## 2021-07-25 DIAGNOSIS — Z7982 Long term (current) use of aspirin: Secondary | ICD-10-CM | POA: Diagnosis not present

## 2021-07-25 DIAGNOSIS — I129 Hypertensive chronic kidney disease with stage 1 through stage 4 chronic kidney disease, or unspecified chronic kidney disease: Secondary | ICD-10-CM | POA: Diagnosis not present

## 2021-07-25 DIAGNOSIS — Z87891 Personal history of nicotine dependence: Secondary | ICD-10-CM | POA: Diagnosis not present

## 2021-07-25 DIAGNOSIS — A0471 Enterocolitis due to Clostridium difficile, recurrent: Secondary | ICD-10-CM

## 2021-07-25 DIAGNOSIS — N200 Calculus of kidney: Secondary | ICD-10-CM | POA: Diagnosis not present

## 2021-07-25 DIAGNOSIS — E7800 Pure hypercholesterolemia, unspecified: Secondary | ICD-10-CM | POA: Diagnosis present

## 2021-07-25 DIAGNOSIS — K219 Gastro-esophageal reflux disease without esophagitis: Secondary | ICD-10-CM | POA: Diagnosis present

## 2021-07-25 DIAGNOSIS — Z833 Family history of diabetes mellitus: Secondary | ICD-10-CM | POA: Diagnosis not present

## 2021-07-25 DIAGNOSIS — Z9049 Acquired absence of other specified parts of digestive tract: Secondary | ICD-10-CM | POA: Diagnosis not present

## 2021-07-25 DIAGNOSIS — I700 Atherosclerosis of aorta: Secondary | ICD-10-CM | POA: Diagnosis not present

## 2021-07-25 DIAGNOSIS — N183 Chronic kidney disease, stage 3 unspecified: Secondary | ICD-10-CM | POA: Diagnosis not present

## 2021-07-25 DIAGNOSIS — E162 Hypoglycemia, unspecified: Secondary | ICD-10-CM | POA: Diagnosis present

## 2021-07-25 DIAGNOSIS — N400 Enlarged prostate without lower urinary tract symptoms: Secondary | ICD-10-CM | POA: Diagnosis present

## 2021-07-25 DIAGNOSIS — Z66 Do not resuscitate: Secondary | ICD-10-CM | POA: Diagnosis present

## 2021-07-25 DIAGNOSIS — I2510 Atherosclerotic heart disease of native coronary artery without angina pectoris: Secondary | ICD-10-CM | POA: Diagnosis present

## 2021-07-25 DIAGNOSIS — Z7951 Long term (current) use of inhaled steroids: Secondary | ICD-10-CM | POA: Diagnosis not present

## 2021-07-25 DIAGNOSIS — R4182 Altered mental status, unspecified: Secondary | ICD-10-CM | POA: Diagnosis present

## 2021-07-25 LAB — BLOOD CULTURE ID PANEL (REFLEXED) - BCID2

## 2021-07-25 LAB — CBC
HCT: 31.5 % — ABNORMAL LOW (ref 39.0–52.0)
Hemoglobin: 10.1 g/dL — ABNORMAL LOW (ref 13.0–17.0)
MCH: 28.3 pg (ref 26.0–34.0)
MCHC: 32.1 g/dL (ref 30.0–36.0)
MCV: 88.2 fL (ref 80.0–100.0)
Platelets: 317 10*3/uL (ref 150–400)
RBC: 3.57 MIL/uL — ABNORMAL LOW (ref 4.22–5.81)
RDW: 15.7 % — ABNORMAL HIGH (ref 11.5–15.5)
WBC: 20.2 10*3/uL — ABNORMAL HIGH (ref 4.0–10.5)
nRBC: 0 % (ref 0.0–0.2)

## 2021-07-25 LAB — URINE CULTURE: Culture: NO GROWTH

## 2021-07-25 LAB — BASIC METABOLIC PANEL
Anion gap: 17 — ABNORMAL HIGH (ref 5–15)
BUN: 9 mg/dL (ref 8–23)
CO2: 14 mmol/L — ABNORMAL LOW (ref 22–32)
Calcium: 8.4 mg/dL — ABNORMAL LOW (ref 8.9–10.3)
Chloride: 108 mmol/L (ref 98–111)
Creatinine, Ser: 1.54 mg/dL — ABNORMAL HIGH (ref 0.61–1.24)
GFR, Estimated: 43 mL/min — ABNORMAL LOW (ref >60)
Glucose, Bld: 57 mg/dL — ABNORMAL LOW (ref 70–99)
Potassium: 2.8 mmol/L — ABNORMAL LOW (ref 3.5–5.1)
Sodium: 139 mmol/L (ref 135–145)

## 2021-07-25 LAB — GLUCOSE, CAPILLARY: Glucose-Capillary: 154 mg/dL — ABNORMAL HIGH (ref 70–99)

## 2021-07-25 MED ORDER — SODIUM CHLORIDE 0.45 % IV SOLN
INTRAVENOUS | Status: DC
Start: 2021-07-25 — End: 2021-07-26
  Administered 2021-07-26: 14:00:00 100 mL/h via INTRAVENOUS

## 2021-07-25 MED ORDER — POTASSIUM CHLORIDE 20 MEQ PO PACK
40.0000 meq | PACK | ORAL | Status: AC
  Administered 2021-07-25 (×2): 40 meq via ORAL
  Filled 2021-07-25 (×2): qty 2

## 2021-07-25 NOTE — Progress Notes (Signed)
°  Transition of Care [facility]) Screening Note   Patient Details  Name: Leon Estes Date of Birth: 05/16/1931   Transition of Care [facility]) CM/SW Contact:    Pollie Friar, RN Phone Number: 07/25/2021, 4:01 PM    Transition of Care Department [facility]) has reviewed patient. We will continue to monitor patient advancement through interdisciplinary progression rounds. If new patient transition needs arise, please place a TOC consult.

## 2021-07-25 NOTE — Progress Notes (Signed)
PHARMACY - PHYSICIAN COMMUNICATION CRITICAL VALUE ALERT - BLOOD CULTURE IDENTIFICATION (BCID)  Leon Estes is an 87 y.o. male who presented to [facility] on 07/24/2021 with a chief complaint of altered mental status  Assessment:  One blood culture anaerobic bottle showing a gram variable rod, nothing identified on BCID.  Could be contamination.  Name of physician (or Provider) Contacted: Dr. Maryland Pink  Current antibiotics: vancomycin oral for CDiff treatment  Changes to prescribed antibiotics recommended: none - monitor without antibiotic changes   Results for orders placed or performed during the hospital encounter of 07/24/21  Blood Culture ID Panel (Reflexed) (Collected: 07/24/2021  3:24 AM)  Result Value Ref Range   Enterococcus faecalis NOT DETECTED NOT DETECTED   Enterococcus Faecium NOT DETECTED NOT DETECTED   Listeria monocytogenes NOT DETECTED NOT DETECTED   Staphylococcus species NOT DETECTED NOT DETECTED   Staphylococcus aureus (BCID) NOT DETECTED NOT DETECTED   Staphylococcus epidermidis NOT DETECTED NOT DETECTED   Staphylococcus lugdunensis NOT DETECTED NOT DETECTED   Streptococcus species NOT DETECTED NOT DETECTED   Streptococcus agalactiae NOT DETECTED NOT DETECTED   Streptococcus pneumoniae NOT DETECTED NOT DETECTED   Streptococcus pyogenes NOT DETECTED NOT DETECTED   A.calcoaceticus-baumannii NOT DETECTED NOT DETECTED   Bacteroides fragilis NOT DETECTED NOT DETECTED   Enterobacterales NOT DETECTED NOT DETECTED   Enterobacter cloacae complex NOT DETECTED NOT DETECTED   Escherichia coli NOT DETECTED NOT DETECTED   Klebsiella aerogenes NOT DETECTED NOT DETECTED   Klebsiella oxytoca NOT DETECTED NOT DETECTED   Klebsiella pneumoniae NOT DETECTED NOT DETECTED   Proteus species NOT DETECTED NOT DETECTED   Salmonella species NOT DETECTED NOT DETECTED   Serratia marcescens NOT DETECTED NOT DETECTED   Haemophilus influenzae NOT DETECTED NOT DETECTED   Neisseria  meningitidis NOT DETECTED NOT DETECTED   Pseudomonas aeruginosa NOT DETECTED NOT DETECTED   Stenotrophomonas maltophilia NOT DETECTED NOT DETECTED   Candida albicans NOT DETECTED NOT DETECTED   Candida auris NOT DETECTED NOT DETECTED   Candida glabrata NOT DETECTED NOT DETECTED   Candida krusei NOT DETECTED NOT DETECTED   Candida parapsilosis NOT DETECTED NOT DETECTED   Candida tropicalis NOT DETECTED NOT DETECTED   Cryptococcus neoformans/gattii NOT DETECTED NOT DETECTED    [name] 07/25/2021  12:35 PM

## 2021-07-25 NOTE — Plan of Care (Signed)

## 2021-07-25 NOTE — Progress Notes (Addendum)
[facility] PROGRESS NOTE   Leon Estes PYP:[id number] DOB: [birth year] DOA: 07/24/2021  PCP: Venia Carbon, MD  Brief History/Interval Summary: Leon Estes is a 87 y.o. male with medical history significant of BPH; stage 3 CKD; COPD; CAD; HTN; HLD; recent diagnosis of Schwannoma; and recurrent C diff coliitis presenting with AMS, somnolence.  Apparently was quite agitated and delirious 2 days prior to admission.  Apparently had terrible diarrhea along with fever and chills last week for which he was prescribed vancomycin for presumed C. difficile by his PCP.  Apparently continue to have diarrhea despite taking vancomycin.  Hospitalized for further management.    Consultants: None  Procedures: None    Subjective/Interval History: Patient mentions that he is having lower abdominal pain.  Denies any diarrhea overnight.  Some nausea but no vomiting.  Denies any headaches.  No chest pain shortness of breath.    Assessment/Plan:  Acute metabolic encephalopathy Patient was quite delirious on Sunday.  Patient had a similar presentation when he was hospitalized back in September.  At that time outpatient neuropsychiatric testing was recommended for possible underlying dementia.  He was given Seroquel which is not on his current medication list.   CT head done in the ED did not show any acute findings.  His altered mentation was thought to be secondary to acute illness.  Mentation seems to have improved.  He does not have any focal deficits.  Continue to monitor.  Acute diarrhea with abdominal pain/presumed recurrent C. difficile Patient reports lower abdominal pain.  He has not had any CT scans recently.  The one done previously was in May which she was done in the setting of acute C. difficile infection and showed dilated colon.  Patient's abdomen seems benign but there is tenderness in the lower abdomen.  We will do a bladder scan.  Since he has had recurrent C. difficile  infections it may be reasonable to do a CT scan of his abdomen pelvis which will be ordered. He remains on a long vancomycin taper.  Apparently unable to afford D[employer]id.  Acute kidney injury/hypokalemia/metabolic acidosis Creatinine appears to have worsened.  Gone up to 1.54.  We will increase the rate of his IV fluids.  Check UA.  Recheck labs tomorrow.  Monitor urine output. Potassium to be repleted.  Hypokalemia is likely due to GI loss. Metabolic acidosis likely also due to GI loss. Magnesium was 1.8  Leukocytosis Possibly from infection.  Proceed with CT abdomen pelvis as discussed above.  No other source of infection found.  Chest x-ray was clear.  UA was also clear.  Normocytic anemia Seems to be close to baseline.  Check anemia panel.  Hypoglycemia Noted to have glucose level of 50s this morning.  Seems to be asymptomatic.  Likely due to poor oral intake.  Recheck after he has been given a snack.  History of COPD Stable.  Continue inhalers.  Schwannoma To follow-up with neurosurgery in the outpatient setting.  Hyperlipidemia Continue Zoloft.  History of BPH Continue Flomax.   DVT Prophylaxis: Lovenox Code Status: DNR Family Communication: No family at bedside Disposition Plan: PT and OT evaluation.  Hopefully return home when improved  Status is: Observation  The patient will require care spanning > 2 midnights and should be moved to inpatient because: Acute kidney injury, abdominal pain      Medications: Scheduled:  acidophilus  1 capsule Oral Daily   aspirin EC  81 mg Oral Daily   enoxaparin (LOVENOX)  injection  40 mg Subcutaneous Q24H   gabapentin  300 mg Oral QHS   mometasone-formoterol  2 puff Inhalation BID   pantoprazole  40 mg Oral QAC breakfast   simvastatin  40 mg Oral QPM   sodium chloride flush  3 mL Intravenous Q12H   tamsulosin  0.4 mg Oral QHS   vancomycin  125 mg Oral QID   Followed by   Derrill Memo ON 08/04/2021] vancomycin  125 mg Oral BID    Followed by   Derrill Memo ON 08/11/2021] vancomycin  125 mg Oral Daily   Followed by   Derrill Memo ON 08/18/2021] vancomycin  125 mg Oral QODAY   Continuous:  lactated ringers 75 mL/hr at 07/25/21 0815   KDT:OIZTIWPYKDXIP **OR** acetaminophen, hydrALAZINE, morphine injection, ondansetron **OR** ondansetron (ZOFRAN) IV, oxyCODONE, traMADol  Antibiotics: Anti-infectives (From admission, onward)    Start     Dose/Rate Route Frequency Ordered Stop   08/18/21 1000  vancomycin (VANCOCIN) capsule 125 mg       See Hyperspace for full Linked Orders Report.   125 mg Oral Every other day 07/24/21 2021 10/13/21 0959   08/11/21 1000  vancomycin (VANCOCIN) capsule 125 mg       See Hyperspace for full Linked Orders Report.   125 mg Oral Daily 07/24/21 2021 08/18/21 0959   08/04/21 1000  vancomycin (VANCOCIN) capsule 125 mg       See Hyperspace for full Linked Orders Report.   125 mg Oral 2 times daily 07/24/21 2021 08/11/21 0959   07/24/21 2200  vancomycin (VANCOCIN) capsule 125 mg       Note to Pharmacy: Four times a day for 2 weeks. Then twice a day for 1 week, then daily for 1 week. Finally, continue it every other day for 8 weeks  See Hyperspace for full Linked Orders Report.   125 mg Oral 4 times daily 07/24/21 2021 08/04/21 0959   07/24/21 1746  vancomycin (VANCOCIN) capsule 125 mg  Status:  Discontinued       Note to Pharmacy: OP SIG:For 2 weeks. Then twice a day for 1 week, then daily for 1 week. Finally, continue it every other day for 8 weeks Patient taking differently: 1 capsule qid x 2 weeks, then 1 capsule bid x 1 week, then 1 capsule qd x 1 week, then 1 capsule every other day x 8 weeks.     125 mg Oral See admin instructions 07/24/21 1746 07/24/21 2021       Objective:  Vital Signs  Vitals:   07/24/21 1945 07/24/21 2008 07/24/21 2343 07/25/21 0400  BP: (!) 149/67 (!) 156/73 (!) 156/80 (!) 154/72  Pulse: (!) 110 (!) 119 (!) 101 90  Resp: 16 18 17 20   Temp: 100 F (37.8 C) 98.6 F  (37 C) 99.4 F (37.4 C) 97.9 F (36.6 C)  TempSrc: Oral Oral Oral Oral  SpO2: 94% 93%  95%  Weight:      Height:        Intake/Output Summary (Last 24 hours) at 07/25/2021 0839 Last data filed at 07/24/2021 1659 Gross per 24 hour  Intake --  Output 650 ml  Net -650 ml   Filed Weights   07/24/21 0313  Weight: 72.6 kg    General appearance: Awake alert.  In no distress.  Mildly distracted Resp: Clear to auscultation bilaterally.  Normal effort Cardio: S1-S2 is normal regular.  No S3-S4.  No rubs murmurs or bruit GI: Abdomen is soft.  Tender in the lower abdomen  without any rebound rigidity or guarding.  No obvious masses organomegaly appreciated. Extremities: No edema.  Full range of motion of lower extremities. Neurologic: Slightly distracted but oriented to place year month..  No focal neurological deficits.    Lab Results:  Data Reviewed: I have personally reviewed following labs and imaging studies  CBC: Recent Labs  Lab 07/24/21 0324 07/24/21 0346 07/25/21 0638  WBC 10.2  --  20.2*  NEUTROABS 4.8  --   --   HGB 10.9* 10.9* 10.1*  HCT 34.6* 32.0* 31.5*  MCV 88.9  --  88.2  PLT 287  --  628    Basic Metabolic Panel: Recent Labs  Lab 07/24/21 0324 07/24/21 0346 07/24/21 0505 07/25/21 0638  NA 139 143  --  139  K 2.6* 2.6*  --  2.8*  CL 106 106  --  108  CO2 24  --   --  14*  GLUCOSE 86 91  --  57*  BUN 16 17  --  9  CREATININE 1.21 1.20  --  1.54*  CALCIUM 8.8*  --   --  8.4*  MG  --   --  1.8  --     GFR: Estimated Creatinine Clearance: 34 mL/min (A) (by C-G formula based on SCr of 1.54 mg/dL (H)).  Liver Function Tests: Recent Labs  Lab 07/24/21 0324  AST 30  ALT 23  ALKPHOS 73  BILITOT 0.6  PROT 6.6  ALBUMIN 3.2*     Coagulation Profile: Recent Labs  Lab 07/24/21 0324  INR 1.1     Recent Results (from the past 240 hour(s))  Resp Panel by RT-PCR (Flu A&B, Covid) Nasopharyngeal Swab     Status: None   Collection Time:  07/24/21  5:10 AM   Specimen: Nasopharyngeal Swab; Nasopharyngeal(NP) swabs in vial transport medium  Result Value Ref Range Status   SARS Coronavirus 2 by RT PCR NEGATIVE NEGATIVE Final    Comment: (NOTE) SARS-CoV-2 target nucleic acids are NOT DETECTED.  The SARS-CoV-2 RNA is generally detectable in upper respiratory specimens during the acute phase of infection. The lowest concentration of SARS-CoV-2 viral copies this assay can detect is 138 copies/mL. A negative result does not preclude SARS-Cov-2 infection and should not be used as the sole basis for treatment or other patient management decisions. A negative result may occur with  improper specimen collection/handling, submission of specimen other than nasopharyngeal swab, presence of viral mutation(s) within the areas targeted by this assay, and inadequate number of viral copies(<138 copies/mL). A negative result must be combined with clinical observations, patient history, and epidemiological information. The expected result is Negative.  Fact Sheet for Patients:  EntrepreneurPulse.com.au  Fact Sheet for Healthcare Providers:  IncredibleEmployment.be  This test is no t yet approved or cleared by the [place] FDA and  has been authorized for detection and/or diagnosis of SARS-CoV-2 by FDA under an Emergency Use Authorization (EUA). This EUA will remain  in effect (meaning this test can be used) for the duration of the COVID-19 declaration under Section 564(b)(1) of the Act, 21 U.S.C.section 360bbb-3(b)(1), unless the authorization is terminated  or revoked sooner.       Influenza A by PCR NEGATIVE NEGATIVE Final   Influenza B by PCR NEGATIVE NEGATIVE Final    Comment: (NOTE) The Xpert Xpress SARS-CoV-2/FLU/RSV plus assay is intended as an aid in the diagnosis of influenza from Nasopharyngeal swab specimens and should not be used as a sole basis for treatment. Nasal washings  and aspirates are unacceptable for Xpert Xpress SARS-CoV-2/FLU/RSV testing.  Fact Sheet for Patients: EntrepreneurPulse.com.au  Fact Sheet for Healthcare Providers: IncredibleEmployment.be  This test is not yet approved or cleared by the [place] FDA and has been authorized for detection and/or diagnosis of SARS-CoV-2 by FDA under an Emergency Use Authorization (EUA). This EUA will remain in effect (meaning this test can be used) for the duration of the COVID-19 declaration under Section 564(b)(1) of the Act, 21 U.S.C. section 360bbb-3(b)(1), unless the authorization is terminated or revoked.  Performed at [facility], [place] [address], [place], [place] [address]   Urine Culture     Status: None   Collection Time: 07/24/21  5:42 AM   Specimen: In/Out Cath Urine  Result Value Ref Range Status   Specimen Description IN/OUT CATH URINE  Final   Special Requests NONE  Final   Culture   Final    NO GROWTH Performed at [facility], [place] [address], [place], [place] [address]    Report Status 07/25/2021 FINAL  Final  C Difficile Quick Screen w PCR reflex     Status: None   Collection Time: 07/24/21 10:46 AM   Specimen: STOOL  Result Value Ref Range Status   C Diff antigen NEGATIVE NEGATIVE Final   C Diff toxin NEGATIVE NEGATIVE Final   C Diff interpretation No C. difficile detected.  Final    Comment: Performed at [facility], [place] [address] [place], [place] [address]      Radiology Studies: CT HEAD WO CONTRAST  Result Date: 07/24/2021 CLINICAL DATA:  Mental status change, unknown cause. EXAM: CT HEAD WITHOUT CONTRAST TECHNIQUE: Contiguous axial images were obtained from the base of the skull through the vertex without intravenous contrast. COMPARISON:  04/18/2021. FINDINGS: Brain: No acute intracranial hemorrhage, midline shift or mass effect. No extra-axial fluid collection. Diffuse atrophy is noted. Subcortical and  periventricular white matter hypodensities are present bilaterally. There is no hydrocephalus. Vascular: No hyperdense vessel or unexpected calcification. Skull: No definite fracture. Sinuses/Orbits: There is mucosal thickening in the left maxillary sinus. Partial opacification of the ethmoid sinuses bilaterally. No acute orbital abnormality. Other: Examination is limited due to motion artifact. IMPRESSION: 1. No acute intracranial hemorrhage. 2. Atrophy with chronic microvascular ischemic changes. 3. 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 Electronically Signed   By: Brett Fairy M.D.   On: 07/24/2021 04:49   DG Chest Port 1 View  Result Date: 07/24/2021 CLINICAL DATA:  Possible sepsis.  Altered mental status. EXAM: PORTABLE CHEST 1 VIEW COMPARISON:  04/29/2021. FINDINGS: The heart mildly enlarged and pulmonary vascular congestion is noted. There is mild atherosclerotic calcification of the aorta. No consolidation, effusion, or pneumothorax. No acute osseous abnormality. IMPRESSION: Cardiomegaly with mildly distended pulmonary vasculature. Electronically Signed   By: Brett Fairy M.D.   On: 07/24/2021 03:36       LOS: 0 days   [facility] Pager on [email or web address]  07/25/2021, 8:39 AM

## 2021-07-26 LAB — BASIC METABOLIC PANEL
Anion gap: 6 (ref 5–15)
Anion gap: 6 (ref 5–15)
BUN: 11 mg/dL (ref 8–23)
BUN: 11 mg/dL (ref 8–23)
CO2: 22 mmol/L (ref 22–32)
CO2: 20 mmol/L — ABNORMAL LOW (ref 22–32)
Calcium: 8.2 mg/dL — ABNORMAL LOW (ref 8.9–10.3)
Calcium: 8.5 mg/dL — ABNORMAL LOW (ref 8.9–10.3)
Chloride: 108 mmol/L (ref 98–111)
Chloride: 110 mmol/L (ref 98–111)
Creatinine, Ser: 1.07 mg/dL (ref 0.61–1.24)
Creatinine, Ser: 1.11 mg/dL (ref 0.61–1.24)
GFR, Estimated: >60 mL/min (ref >60)
GFR, Estimated: >60 mL/min (ref >60)
Glucose, Bld: 114 mg/dL — ABNORMAL HIGH (ref 70–99)
Glucose, Bld: 129 mg/dL — ABNORMAL HIGH (ref 70–99)
Potassium: 2.8 mmol/L — ABNORMAL LOW (ref 3.5–5.1)
Potassium: 3.1 mmol/L — ABNORMAL LOW (ref 3.5–5.1)
Sodium: 136 mmol/L (ref 135–145)
Sodium: 136 mmol/L (ref 135–145)

## 2021-07-26 LAB — CBC WITH DIFFERENTIAL/PLATELET
Abs Immature Granulocytes: 0.42 10*3/uL — ABNORMAL HIGH (ref 0.00–0.07)
Basophils Absolute: 0.1 10*3/uL (ref 0.0–0.1)
Basophils Relative: 0 %
Eosinophils Absolute: 0.3 10*3/uL (ref 0.0–0.5)
Eosinophils Relative: 2 %
HCT: 26.4 % — ABNORMAL LOW (ref 39.0–52.0)
Hemoglobin: 8.6 g/dL — ABNORMAL LOW (ref 13.0–17.0)
Immature Granulocytes: 4 %
Lymphocytes Relative: 17 %
Lymphs Abs: 1.9 10*3/uL (ref 0.7–4.0)
MCH: 28.1 pg (ref 26.0–34.0)
MCHC: 32.6 g/dL (ref 30.0–36.0)
MCV: 86.3 fL (ref 80.0–100.0)
Monocytes Absolute: 1.1 10*3/uL — ABNORMAL HIGH (ref 0.1–1.0)
Monocytes Relative: 10 %
Neutro Abs: 7.5 10*3/uL (ref 1.7–7.7)
Neutrophils Relative %: 67 %
Platelets: 300 10*3/uL (ref 150–400)
RBC: 3.06 MIL/uL — ABNORMAL LOW (ref 4.22–5.81)
RDW: 15.9 % — ABNORMAL HIGH (ref 11.5–15.5)
WBC: 11.3 10*3/uL — ABNORMAL HIGH (ref 4.0–10.5)
nRBC: 0 % (ref 0.0–0.2)

## 2021-07-26 LAB — IRON AND TIBC
Iron: 24 ug/dL — ABNORMAL LOW (ref 45–182)
Saturation Ratios: 9 % — ABNORMAL LOW (ref 17.9–39.5)
TIBC: 279 ug/dL (ref 250–450)
UIBC: 255 ug/dL

## 2021-07-26 LAB — GLUCOSE, CAPILLARY
Glucose-Capillary: 114 mg/dL — ABNORMAL HIGH (ref 70–99)
Glucose-Capillary: 160 mg/dL — ABNORMAL HIGH (ref 70–99)
Glucose-Capillary: 105 mg/dL — ABNORMAL HIGH (ref 70–99)

## 2021-07-26 LAB — FERRITIN: Ferritin: 44 ng/mL (ref 24–336)

## 2021-07-26 LAB — CBC
HCT: 27.3 % — ABNORMAL LOW (ref 39.0–52.0)
Hemoglobin: 8.9 g/dL — ABNORMAL LOW (ref 13.0–17.0)
MCH: 28.1 pg (ref 26.0–34.0)
MCHC: 32.6 g/dL (ref 30.0–36.0)
MCV: 86.1 fL (ref 80.0–100.0)
Platelets: 342 10*3/uL (ref 150–400)
RBC: 3.17 MIL/uL — ABNORMAL LOW (ref 4.22–5.81)
RDW: 16 % — ABNORMAL HIGH (ref 11.5–15.5)
WBC: 11.7 10*3/uL — ABNORMAL HIGH (ref 4.0–10.5)
nRBC: 0 % (ref 0.0–0.2)

## 2021-07-26 LAB — CULTURE, BLOOD (ROUTINE X 2): Special Requests: ADEQUATE

## 2021-07-26 LAB — MAGNESIUM
Magnesium: 1.5 mg/dL — ABNORMAL LOW (ref 1.7–2.4)
Magnesium: 2.2 mg/dL (ref 1.7–2.4)

## 2021-07-26 LAB — RETICULOCYTES
Immature Retic Fract: 32.3 % — ABNORMAL HIGH (ref 2.3–15.9)
RBC.: 3.01 MIL/uL — ABNORMAL LOW (ref 4.22–5.81)
Retic Count, Absolute: 51.8 10*3/uL (ref 19.0–186.0)
Retic Ct Pct: 1.7 % (ref 0.4–3.1)

## 2021-07-26 LAB — VITAMIN B12: Vitamin B-12: 1344 pg/mL — ABNORMAL HIGH (ref 180–914)

## 2021-07-26 LAB — FOLATE: Folate: 15.4 ng/mL (ref >5.9)

## 2021-07-26 MED ORDER — SODIUM CHLORIDE 0.45 % IV SOLN: INTRAVENOUS | Status: AC

## 2021-07-26 MED ORDER — POTASSIUM CHLORIDE 20 MEQ PO PACK
40.0000 meq | PACK | ORAL | Status: AC
  Administered 2021-07-26 (×2): 40 meq via ORAL
  Filled 2021-07-26 (×2): qty 2

## 2021-07-26 MED ORDER — MAGNESIUM SULFATE 4 GM/100ML IV SOLN
4.0000 g | Freq: Once | INTRAVENOUS | Status: AC
  Administered 2021-07-26: 07:00:00 4 g via INTRAVENOUS
  Filled 2021-07-26: qty 100

## 2021-07-26 MED ORDER — POTASSIUM CHLORIDE 20 MEQ PO PACK
40.0000 meq | PACK | ORAL | Status: AC
  Administered 2021-07-26 (×2): 40 meq via ORAL
  Filled 2021-07-26 (×2): qty 2

## 2021-07-26 NOTE — Evaluation (Signed)
Physical Therapy Evaluation Patient Details Name: Leon Estes MRN: [id number] DOB: 12-17-1930 Today's Date: 07/26/2021  History of Present Illness  Leon Estes is a 87 y.o. male with medical history significant of BPH; stage 3 CKD; COPD; CAD; HTN; HLD; recent diagnosis of Schwannoma; and recurrent C diff coliitis presenting with AMS, somnolence.  Clinical Impression  Patient presents with decreased mobility due to generalized weakness, decreased cognition with decreased safety awareness and intermittent delirium per wife, decreased balance and decreased activity tolerance with HR up to 110 with ambulation in hallway.  Prior to illness walking with walker on his own.  Wife reports difficulty taking care of him at home, but hopeful for home when improved.  Recommend follow up HHPT at d/c and aide.  PT will continue to follow acutely.      Recommendations for follow up therapy are one component of a multi-disciplinary discharge planning process, led by the attending physician.  Recommendations may be updated based on patient status, additional functional criteria and insurance authorization.  Follow Up Recommendations Home health PT [facility] aide)    Assistance Recommended at Discharge Frequent or constant Supervision/Assistance  Functional Status Assessment Patient has had a recent decline in their functional status and demonstrates the ability to make significant improvements in function in a reasonable and predictable amount of time.  Equipment Recommendations  Wheelchair cushion (measurements PT);Wheelchair (measurements PT)    Recommendations for Other Services       Precautions / Restrictions Precautions Precautions: Fall Precaution Comments: frequent loose stools      Mobility  Bed Mobility Overal bed mobility: Needs Assistance Bed Mobility: Supine to Sit;Sit to Supine;Rolling Rolling: Min assist   Supine to sit: HOB elevated;Min guard Sit to supine: HOB elevated;Min  guard   General bed mobility comments: rolled in bed for hygiene as initially on bed pan used rail, but assist for further over for hygiene; up to sit with assist for balance and cues to use rails; to supine assist for positioning    Transfers Overall transfer level: Needs assistance Equipment used: Rolling walker (2 wheels) Transfers: Sit to/from Stand Sit to Stand: Min guard           General transfer comment: cues for hand placement, assist for safety/balance    Ambulation/Gait Ambulation/Gait assistance: Min guard Gait Distance (Feet): 150 Feet Assistive device: Rolling walker (2 wheels) Gait Pattern/deviations: Step-through pattern;Decreased stride length;Wide base of support;Trunk flexed;Shuffle       General Gait Details: assist for balance and cues for posture  Stairs            Wheelchair Mobility    Modified Rankin (Stroke Patients Only)       Balance Overall balance assessment: Needs assistance   Sitting balance-Leahy Scale: Good     Standing balance support: Bilateral upper extremity supported Standing balance-Leahy Scale: Poor                               Pertinent Vitals/Pain Pain Assessment: Faces Faces Pain Scale: Hurts little more Pain Location: buttocks Pain Descriptors / Indicators: Tender Pain Intervention(s): Monitored during session;Repositioned    Home Living Family/patient expects to be discharged to:: Private residence Living Arrangements: Spouse/significant other Available Help at Discharge: Family;Available 24 hours/day Type of Home: House Home Access: Stairs to enter;Ramped entrance   Entrance Stairs-Number of Steps: does have ramp in back   Home Layout: Two level;One level Home Equipment: Conservation officer, nature (2 wheels);Rollator (  4 wheels);Shower seat;Cane - single point      Prior Function Prior Level of Function : Needs assist  Cognitive Assist : Mobility (cognitive)     Physical Assist : Mobility  (physical)     Mobility Comments: using walker and with AMS wife assisting to prevent falls       Hand Dominance   Dominant Hand: Right    Extremity/Trunk Assessment   Upper Extremity Assessment Upper Extremity Assessment: Defer to OT evaluation    Lower Extremity Assessment Lower Extremity Assessment: Generalized weakness    Cervical / Trunk Assessment Cervical / Trunk Assessment: Kyphotic  Communication   Communication: HOH  Cognition Arousal/Alertness: Awake/alert Behavior During Therapy: Impulsive Overall Cognitive Status: Impaired/Different from baseline Area of Impairment: Orientation;Memory;Safety/judgement                 Orientation Level: Disoriented to;Place;Time   Memory: Decreased short-term memory   Safety/Judgement: Decreased awareness of deficits     General Comments: wife reports improved but was delerious at home needing her to stay close to prevent falling        General Comments General comments (skin integrity, edema, etc.): wife and daughter present and supportive, wife reports some difficulty taking care of him at home, hopeful for further improvements prior to d/c home.  HR 110 with ambulation    Exercises     Assessment/Plan    PT Assessment Patient needs continued PT services  PT Problem List Decreased strength;Decreased mobility;Decreased balance;Decreased knowledge of use of DME;Decreased cognition;Decreased safety awareness       PT Treatment Interventions DME instruction;Therapeutic activities;Cognitive remediation;Patient/family education;Therapeutic exercise;Gait training;Balance training;Functional mobility training    PT Goals (Current goals can be found in the Care Plan section)  Acute Rehab PT Goals Patient Stated Goal: patient eager for home, wife hoping for more improvements PT Goal Formulation: With patient/family Time For Goal Achievement: 08/09/21 Potential to Achieve Goals: Fair    Frequency Min 3X/week    Barriers to discharge        Co-evaluation               AM-PAC PT "6 Clicks" Mobility  Outcome Measure Help needed turning from your back to your side while in a flat bed without using bedrails?: A Little Help needed moving from lying on your back to sitting on the side of a flat bed without using bedrails?: A Little Help needed moving to and from a bed to a chair (including a wheelchair)?: A Little Help needed standing up from a chair using your arms (e.g., wheelchair or bedside chair)?: A Little Help needed to walk in hospital room?: A Little Help needed climbing 3-5 steps with a railing? : Total 6 Click Score: 16    End of Session Equipment Utilized During Treatment: Gait belt Activity Tolerance: Patient tolerated treatment well Patient left: in bed;with call bell/phone within reach   PT Visit Diagnosis: Other abnormalities of gait and mobility (R26.89);Other symptoms and signs involving the nervous system (R29.898);Muscle weakness (generalized) (M62.81)    Time: 7673-4193 PT Time Calculation (min) (ACUTE ONLY): 27 min   Charges:   PT Evaluation $PT Eval Moderate Complexity: 1 Mod PT Treatments $Gait Training: 8-22 mins        Magda Kiel, PT Acute Rehabilitation Services Pager:[phone] Office:[phone] 07/26/2021   Reginia Naas 07/26/2021, 11:53 AM

## 2021-07-26 NOTE — Progress Notes (Addendum)
[facility] PROGRESS NOTE   Leon Estes OIT:[id number] DOB: 1931/05/17 DOA: 07/24/2021  PCP: Venia Carbon, MD  Brief History/Interval Summary: Leon Estes is a 87 y.o. male with medical history significant of BPH; stage 3 CKD; COPD; CAD; HTN; HLD; recent diagnosis of Schwannoma; and recurrent C diff coliitis presenting with AMS, somnolence.  Apparently was quite agitated and delirious 2 days prior to admission.  Apparently had terrible diarrhea along with fever and chills last week for which he was prescribed vancomycin for presumed C. difficile by his PCP.  Apparently continue to have diarrhea despite taking vancomycin.  Hospitalized for further management.    Consultants: None  Procedures: None    Subjective/Interval History: Patient reports feeling much better overall compared to admission.  Denies nausea, vomiting or abdominal pain.  Tolerating diet.  States that his diarrhea is starting to improve.  However per RN, having incontinent loose stools and report that he was not oriented to her but was been oriented x3 with me.   Assessment/Plan:  Acute metabolic encephalopathy Patient was quite delirious on Sunday.  Patient had a similar presentation when he was hospitalized back in September.  At that time outpatient neuropsychiatric testing was recommended for possible underlying dementia.  He was given Seroquel which is not on his current medication list.   CT head done in the ED did not show any acute findings.  His altered mentation was thought to be secondary to acute illness.  Mentation seems to have improved.  He does not have any focal deficits.  Continue to monitor.  We will need to check with his spouse was in to see him this morning regarding his mental status today.  Acute diarrhea with abdominal pain/presumed recurrent C. difficile Patient reports lower abdominal pain.  Repeat abdominal CT 12/13 showed somewhat patulous colon and fluid-filled to the  rectum, consistent with diarrheal illness, however not overtly distended and without acute inflammatory findings.  Bladder was unremarkable. He remains on a long vancomycin taper.  Apparently unable to afford Dificid. Etiology of diarrhea is unclear.  Repeat stool C. difficile testing and GI panel PCR was negative.?  C. difficile related IBS.  Could consider Imodium if diarrhea does not improve.  Acute kidney injury/metabolic acidosis Creatinine appears to have worsened.  Gone up to 1.54.   AKI likely secondary to prerenal from GI losses and poor oral intake.  Resolved after IV fluids.  Avoid nephrotoxins.  Metabolic acidosis resolved.  Hypokalemia Potassium 2.8 despite replacement yesterday.  We will replace aggressively and follow.  Replace low magnesium.  Hypomagnesemia Magnesium 1.5, replace aggressively and follow  Leukocytosis Chest x-ray was clear.  UA was also clear and urine culture negative.  Blood cultures x2: Negative to date.  CT abdomen without acute findings.  May be stress response or due to acute diarrheal illness.  Has improved.  Normocytic anemia Hemoglobin has dropped from baseline of 10-8.6 today.  No overt bleeding reported.  Could be dilutional.  Follow CBC in a.m. and transfuse if hemoglobin 7 g or less. Anemia panel: Iron 24, TIBC 279, saturation ratio 9, ferritin 44, folate 15.4 and B12: 1344.  Hypoglycemia Appears to have resolved.  Monitor closely and resuscitate per hypoglycemia protocol.  History of COPD Stable.  Continue inhalers.  Schwannoma To follow-up with neurosurgery in the outpatient setting.  Hyperlipidemia Continue Zoloft.  History of BPH Continue Flomax.  Bladder unremarkable on CT abdomen.   DVT Prophylaxis: Lovenox Code Status: DNR Family Communication: No family  at bedside. Unable to reach spouse or daughter via phone Disposition Plan: Home with home health therapies.  Status is: Inpatient  The patient will require care spanning  > 2 midnights and should be moved to inpatient because: Acute kidney injury, abdominal pain      Medications: Scheduled:  acidophilus  1 capsule Oral Daily   aspirin EC  81 mg Oral Daily   enoxaparin (LOVENOX) injection  40 mg Subcutaneous Q24H   gabapentin  300 mg Oral QHS   mometasone-formoterol  2 puff Inhalation BID   pantoprazole  40 mg Oral QAC breakfast   potassium chloride  40 mEq Oral Q4H   simvastatin  40 mg Oral QPM   sodium chloride flush  3 mL Intravenous Q12H   tamsulosin  0.4 mg Oral QHS   vancomycin  125 mg Oral QID   Followed by   Derrill Memo ON 08/04/2021] vancomycin  125 mg Oral BID   Followed by   Derrill Memo ON 08/11/2021] vancomycin  125 mg Oral Daily   Followed by   Derrill Memo ON 08/18/2021] vancomycin  125 mg Oral QODAY   Continuous:  sodium chloride 100 mL/hr (07/26/21 1345)   WUJ:[id number] **OR** acetaminophen, hydrALAZINE, morphine injection, ondansetron **OR** ondansetron (ZOFRAN) IV, oxyCODONE, traMADol  Antibiotics: Anti-infectives (From admission, onward)    Start     Dose/Rate Route Frequency Ordered Stop   08/18/21 1000  vancomycin (VANCOCIN) capsule 125 mg       See Hyperspace for full Linked Orders Report.   125 mg Oral Every other day 07/24/21 2021 10/13/21 0959   08/11/21 1000  vancomycin (VANCOCIN) capsule 125 mg       See Hyperspace for full Linked Orders Report.   125 mg Oral Daily 07/24/21 2021 08/18/21 0959   08/04/21 1000  vancomycin (VANCOCIN) capsule 125 mg       See Hyperspace for full Linked Orders Report.   125 mg Oral 2 times daily 07/24/21 2021 08/11/21 0959   07/24/21 2200  vancomycin (VANCOCIN) capsule 125 mg       Note to Pharmacy: Four times a day for 2 weeks. Then twice a day for 1 week, then daily for 1 week. Finally, continue it every other day for 8 weeks  See Hyperspace for full Linked Orders Report.   125 mg Oral 4 times daily 07/24/21 2021 08/04/21 0959   07/24/21 1746  vancomycin (VANCOCIN) capsule 125 mg  Status:   Discontinued       Note to Pharmacy: OP SIG:For 2 weeks. Then twice a day for 1 week, then daily for 1 week. Finally, continue it every other day for 8 weeks Patient taking differently: 1 capsule qid x 2 weeks, then 1 capsule bid x 1 week, then 1 capsule qd x 1 week, then 1 capsule every other day x 8 weeks.     125 mg Oral See admin instructions 07/24/21 1746 07/24/21 2021       Objective:  Vital Signs  Vitals:   07/26/21 0011 07/26/21 0357 07/26/21 0824 07/26/21 1143  BP: 138/76 128/60 140/63 (!) 128/59  Pulse: 87 79 78 85  Resp: 17 14 (!) 23   Temp: 98.5 F (36.9 C) 99.6 F (37.6 C) 97.9 F (36.6 C) 98.4 F (36.9 C)  TempSrc: Oral Oral Oral Oral  SpO2: 98% 97% 96% 98%  Weight:      Height:        Intake/Output Summary (Last 24 hours) at 07/26/2021 1351 Last data filed at 07/26/2021 0900  Gross per 24 hour  Intake 1409.35 ml  Output --  Net 1409.35 ml   Filed Weights   07/24/21 0313  Weight: 72.6 kg    General exam: Elderly male, moderately built and frail, lying comfortably propped up in bed without distress.  Oral mucosa with borderline hydration. Respiratory system: Clear to auscultation. Respiratory effort normal. Cardiovascular system: S1 & S2 heard, RRR. No JVD, murmurs, rubs, gallops or clicks. No pedal edema.  Telemetry personally reviewed: Sinus rhythm with BBB morphology. Gastrointestinal system: Abdomen is minimally distended, soft, nontender, tympanic to percussion. No organomegaly or masses felt.  High-pitched bowel sounds heard. Central nervous system: Alert and oriented x3. No focal neurological deficits. Extremities: Symmetric 5 x 5 power. Skin: No rashes, lesions or ulcers Psychiatry: Judgement and insight appear somewhat impaired. Mood & affect appropriate.     Lab Results:  Data Reviewed: I have personally reviewed following labs and imaging studies  CBC: Recent Labs  Lab 07/24/21 0324 07/24/21 0346 07/25/21 0638 07/26/21 0333  WBC  10.2  --  20.2* 11.3*  NEUTROABS 4.8  --   --  7.5  HGB 10.9* 10.9* 10.1* 8.6*  HCT 34.6* 32.0* 31.5* 26.4*  MCV 88.9  --  88.2 86.3  PLT 287  --  317 664    Basic Metabolic Panel: Recent Labs  Lab 07/24/21 0324 07/24/21 0346 07/24/21 0505 07/25/21 0638 07/26/21 0333  NA 139 143  --  139 136  K 2.6* 2.6*  --  2.8* 2.8*  CL 106 106  --  108 108  CO2 24  --   --  14* 22  GLUCOSE 86 91  --  57* 114*  BUN 16 17  --  9 11  CREATININE 1.21 1.20  --  1.54* 1.07  CALCIUM 8.8*  --   --  8.4* 8.2*  MG  --   --  1.8  --  1.5*    GFR: Estimated Creatinine Clearance: 49 mL/min (by C-G formula based on SCr of 1.07 mg/dL).  Liver Function Tests: Recent Labs  Lab 07/24/21 0324  AST 30  ALT 23  ALKPHOS 73  BILITOT 0.6  PROT 6.6  ALBUMIN 3.2*     Coagulation Profile: Recent Labs  Lab 07/24/21 0324  INR 1.1     Recent Results (from the past 240 hour(s))  Blood Culture (routine x 2)     Status: Abnormal (Preliminary result)   Collection Time: 07/24/21  3:24 AM   Specimen: BLOOD  Result Value Ref Range Status   Specimen Description BLOOD RIGHT ANTECUBITAL  Final   Special Requests   Final    BOTTLES DRAWN AEROBIC AND ANAEROBIC Blood Culture adequate volume   Culture  Setup Time (A)  Final    GRAM VARIABLE ROD IN BOTH AEROBIC AND ANAEROBIC BOTTLES CRITICAL RESULT CALLED TO, READ BACK BY AND VERIFIED WITH: PHARMD J Iran [id number] AT 66 BY CM    Culture   Final    NO GROWTH 2 DAYS Performed at [facility], [place] [address], [place], [place] [address]    Report Status PENDING  Incomplete  Blood Culture ID Panel (Reflexed)     Status: None   Collection Time: 07/24/21  3:24 AM  Result Value Ref Range Status   Enterococcus faecalis NOT DETECTED NOT DETECTED Final   Enterococcus Faecium NOT DETECTED NOT DETECTED Final   Listeria monocytogenes NOT DETECTED NOT DETECTED Final   Staphylococcus species NOT DETECTED NOT DETECTED Final   Staphylococcus  aureus (BCID) NOT  DETECTED NOT DETECTED Final   Staphylococcus epidermidis NOT DETECTED NOT DETECTED Final   Staphylococcus lugdunensis NOT DETECTED NOT DETECTED Final   Streptococcus species NOT DETECTED NOT DETECTED Final   Streptococcus agalactiae NOT DETECTED NOT DETECTED Final   Streptococcus pneumoniae NOT DETECTED NOT DETECTED Final   Streptococcus pyogenes NOT DETECTED NOT DETECTED Final   A.calcoaceticus-baumannii NOT DETECTED NOT DETECTED Final   Bacteroides fragilis NOT DETECTED NOT DETECTED Final   Enterobacterales NOT DETECTED NOT DETECTED Final   Enterobacter cloacae complex NOT DETECTED NOT DETECTED Final   Escherichia coli NOT DETECTED NOT DETECTED Final   Klebsiella aerogenes NOT DETECTED NOT DETECTED Final   Klebsiella oxytoca NOT DETECTED NOT DETECTED Final   Klebsiella pneumoniae NOT DETECTED NOT DETECTED Final   Proteus species NOT DETECTED NOT DETECTED Final   Salmonella species NOT DETECTED NOT DETECTED Final   Serratia marcescens NOT DETECTED NOT DETECTED Final   Haemophilus influenzae NOT DETECTED NOT DETECTED Final   Neisseria meningitidis NOT DETECTED NOT DETECTED Final   Pseudomonas aeruginosa NOT DETECTED NOT DETECTED Final   Stenotrophomonas maltophilia NOT DETECTED NOT DETECTED Final   Candida albicans NOT DETECTED NOT DETECTED Final   Candida auris NOT DETECTED NOT DETECTED Final   Candida glabrata NOT DETECTED NOT DETECTED Final   Candida krusei NOT DETECTED NOT DETECTED Final   Candida parapsilosis NOT DETECTED NOT DETECTED Final   Candida tropicalis NOT DETECTED NOT DETECTED Final   Cryptococcus neoformans/gattii NOT DETECTED NOT DETECTED Final    Comment: Performed at [facility], [address], [place], [place] [address]  Resp Panel by RT-PCR (Flu A&B, Covid) Nasopharyngeal Swab     Status: None   Collection Time: 07/24/21  5:10 AM   Specimen: Nasopharyngeal Swab; Nasopharyngeal(NP) swabs in vial transport medium  Result Value Ref Range Status   SARS  Coronavirus 2 by RT PCR NEGATIVE NEGATIVE Final    Comment: (NOTE) SARS-CoV-2 target nucleic acids are NOT DETECTED.  The SARS-CoV-2 RNA is generally detectable in upper respiratory specimens during the acute phase of infection. The lowest concentration of SARS-CoV-2 viral copies this assay can detect is 138 copies/mL. A negative result does not preclude SARS-Cov-2 infection and should not be used as the sole basis for treatment or other patient management decisions. A negative result may occur with  improper specimen collection/handling, submission of specimen other than nasopharyngeal swab, presence of viral mutation(s) within the areas targeted by this assay, and inadequate number of viral copies(<138 copies/mL). A negative result must be combined with clinical observations, patient history, and epidemiological information. The expected result is Negative.  Fact Sheet for Patients:  EntrepreneurPulse.com.au  Fact Sheet for Healthcare Providers:  IncredibleEmployment.be  This test is no t yet approved or cleared by the [place] FDA and  has been authorized for detection and/or diagnosis of SARS-CoV-2 by FDA under an Emergency Use Authorization (EUA). This EUA will remain  in effect (meaning this test can be used) for the duration of the COVID-19 declaration under Section 564(b)(1) of the Act, 21 U.S.C.section 360bbb-3(b)(1), unless the authorization is terminated  or revoked sooner.       Influenza A by PCR NEGATIVE NEGATIVE Final   Influenza B by PCR NEGATIVE NEGATIVE Final    Comment: (NOTE) The Xpert Xpress SARS-CoV-2/FLU/RSV plus assay is intended as an aid in the diagnosis of influenza from Nasopharyngeal swab specimens and should not be used as a sole basis for treatment. Nasal washings and aspirates are  unacceptable for Xpert Xpress SARS-CoV-2/FLU/RSV testing.  Fact Sheet for  Patients: EntrepreneurPulse.com.au  Fact Sheet for Healthcare Providers: IncredibleEmployment.be  This test is not yet approved or cleared by the [place] FDA and has been authorized for detection and/or diagnosis of SARS-CoV-2 by FDA under an Emergency Use Authorization (EUA). This EUA will remain in effect (meaning this test can be used) for the duration of the COVID-19 declaration under Section 564(b)(1) of the Act, 21 U.S.C. section 360bbb-3(b)(1), unless the authorization is terminated or revoked.  Performed at [facility], [place] [address], [place], [place] [address]   Urine Culture     Status: None   Collection Time: 07/24/21  5:42 AM   Specimen: In/Out Cath Urine  Result Value Ref Range Status   Specimen Description IN/OUT CATH URINE  Final   Special Requests NONE  Final   Culture   Final    NO GROWTH Performed at [facility], [place] [address] [place], [place] [address]    Report Status 07/25/2021 FINAL  Final  C Difficile Quick Screen w PCR reflex     Status: None   Collection Time: 07/24/21 10:46 AM   Specimen: STOOL  Result Value Ref Range Status   C Diff antigen NEGATIVE NEGATIVE Final   C Diff toxin NEGATIVE NEGATIVE Final   C Diff interpretation No C. difficile detected.  Final    Comment: Performed at [facility], [place] [address], [place], [place] [address]  Blood Culture (routine x 2)     Status: None (Preliminary result)   Collection Time: 07/24/21 11:24 AM   Specimen: BLOOD  Result Value Ref Range Status   Specimen Description BLOOD BLOOD RIGHT WRIST  Final   Special Requests   Final    BOTTLES DRAWN AEROBIC ONLY Blood Culture adequate volume   Culture   Final    NO GROWTH 2 DAYS Performed at [facility], [place] [address], [place], [place] [address]    Report Status PENDING  Incomplete      Radiology Studies: CT ABDOMEN PELVIS WO CONTRAST  Result Date: 07/25/2021 CLINICAL DATA:   Abdominal pain, recurrent C difficile infection EXAM: CT ABDOMEN AND PELVIS WITHOUT CONTRAST TECHNIQUE: Multidetector CT imaging of the abdomen and pelvis was performed following the standard protocol without IV contrast. COMPARISON:  01/02/2021 FINDINGS: Lower chest: No acute abnormality. Coronary artery calcifications. Scarring and or atelectasis of the dependent right lung base. Hepatobiliary: No focal liver abnormality is seen. Status post cholecystectomy. No biliary dilatation. Pancreas: Unremarkable. No pancreatic ductal dilatation or surrounding inflammatory changes. Spleen: Normal in size without significant abnormality. Adrenals/Urinary Tract: Adrenal glands are unremarkable. Punctuate nonobstructive calculus of the superior pole of the right kidney. Bladder is unremarkable. Stomach/Bowel: Stomach is within normal limits. Appendix is not clearly visualized and may be surgically absent. Colon is somewhat patulous and fluid-filled to the rectum, however not overtly distended, largest loops measuring 6.5 cm. No acute inflammatory findings. Vascular/Lymphatic: Aortic atherosclerosis. No enlarged abdominal or pelvic lymph nodes. Reproductive: Prostatomegaly. Other: No abdominal wall hernia or abnormality. No abdominopelvic ascites. Musculoskeletal: No acute or significant osseous findings. Chronic bilateral pars defects of L5 with anterolisthesis of L5 on S1. IMPRESSION: 1. Colon is somewhat patulous and fluid-filled to the rectum, consistent with diarrheal illness, however not overtly distended and without acute inflammatory findings. 2. Nonobstructive right nephrolithiasis.  No hydronephrosis. 3. Prostatomegaly. 4. Coronary artery disease. Aortic Atherosclerosis (ICD10-I70.0). Electronically Signed   By: Jamse Mead.D.  On: 07/25/2021 12:47       LOS: 1 day   Vernell Leep, MD,  FACP, [facility], [facility], [facility] (Care Management Physician Certified) [place]  To  contact the attending provider between 7A-7P or the covering provider during after hours 7P-7A, please log into the web site [email or web address] and access using universal [place] password for that web site. If you do not have the password, please call the hospital operator.

## 2021-07-26 NOTE — Evaluation (Signed)
Occupational Therapy Evaluation Patient Details Name: AMARRION PASTORINO MRN: [id number] DOB: 04/25/1931 Today's Date: 07/26/2021   History of Present Illness Dennis ASIR BINGLEY is a 87 y.o. male with medical history significant of BPH; stage 3 CKD; COPD; CAD; HTN; HLD; recent diagnosis of Schwannoma; and recurrent C diff coliitis presenting with AMS, somnolence.   Clinical Impression   Akhil was evaluated s/p the above admission list; PTA he was generally mod I/assist for safety from his wife. He lives in a 2 level home with a ramped entrance option. Upon evaluation he is limited by impaired cognition, pain, poor activity tolerance and generalized weakness. Overall he was min A for functional ambulation and up to min A for ADLs. He benefits from verbal cues and assistance for safety. Pt will benefit from OT acutely. Recommend d/c to home with [place] and increased aide support to relieve caregiver burden.      Recommendations for follow up therapy are one component of a multi-disciplinary discharge planning process, led by the attending physician.  Recommendations may be updated based on patient status, additional functional criteria and insurance authorization.   Follow Up Recommendations  Home health OT [facility] aide)    Assistance Recommended at Discharge Frequent or constant Supervision/Assistance  Functional Status Assessment  Patient has had a recent decline in their functional status and demonstrates the ability to make significant improvements in function in a reasonable and predictable amount of time.  Equipment Recommendations  None recommended by OT       Precautions / Restrictions Precautions Precautions: Fall Precaution Comments: frequent loose stools Restrictions Weight Bearing Restrictions: No      Mobility Bed Mobility Overal bed mobility: Needs Assistance Bed Mobility: Supine to Sit;Sit to Supine;Rolling Rolling: Min guard   Supine to sit: Min guard Sit to supine: Min  guard   General bed mobility comments: no physical assist required, incr time and effort    Transfers Overall transfer level: Needs assistance Equipment used: Rolling walker (2 wheels) Transfers: Sit to/from Stand Sit to Stand: Min guard           General transfer comment: cues for hand placement, assist for safety/balance      Balance Overall balance assessment: Needs assistance   Sitting balance-Leahy Scale: Good     Standing balance support: Bilateral upper extremity supported Standing balance-Leahy Scale: Poor                             ADL either performed or assessed with clinical judgement   ADL Overall ADL's : Needs assistance/impaired Eating/Feeding: Independent;Sitting   Grooming: Set up;Sitting Grooming Details (indicate cue type and reason): or min guard standing Upper Body Bathing: Set up;Sitting   Lower Body Bathing: Minimal assistance;Sit to/from stand   Upper Body Dressing : Set up;Sitting   Lower Body Dressing: Minimal assistance;Sit to/from stand   Toilet Transfer: Minimal assistance;Ambulation;Rolling walker (2 wheels) Toilet Transfer Details (indicate cue type and reason): for DME management & safety Toileting- Clothing Manipulation and Hygiene: Supervision/safety;Sitting/lateral lean       Functional mobility during ADLs: Min guard;Rolling walker (2 wheels) General ADL Comments: pt is limited by imapired cognition and generalized weakness. He needs verbal cues and physical assist to safety mobility in room/transfer in bathroom     Vision Baseline Vision/History: 1 Wears glasses Ability to See in Adequate Light: 0 Adequate Patient Visual Report: No change from baseline Vision Assessment?: No apparent visual deficits  Perception     Praxis      Pertinent Vitals/Pain Pain Assessment: Faces Faces Pain Scale: Hurts a little bit Pain Location: generalized with movement Pain Descriptors / Indicators: Grimacing Pain  Intervention(s): Monitored during session     Hand Dominance Right   Extremity/Trunk Assessment Upper Extremity Assessment Upper Extremity Assessment: Generalized weakness   Lower Extremity Assessment Lower Extremity Assessment: Defer to PT evaluation   Cervical / Trunk Assessment Cervical / Trunk Assessment: Kyphotic   Communication Communication Communication: HOH   Cognition Arousal/Alertness: Awake/alert Behavior During Therapy: Impulsive Overall Cognitive Status: No family/caregiver present to determine baseline cognitive functioning                                 General Comments: no family present this session. Pt will slow proccessing and poor awareness. He benefits from cues for safety. Ox4 this session but RN reports that he has been confused today     General Comments  VSS on RA    Exercises     Shoulder Instructions      Home Living Family/patient expects to be discharged to:: Private residence Living Arrangements: Spouse/significant other Available Help at Discharge: Family;Available 24 hours/day Type of Home: House Home Access: Stairs to enter;Ramped entrance Entrance Stairs-Number of Steps: does have ramp in back   Home Layout: Two level;One level     Bathroom Shower/Tub: Teacher, early years/pre: Standard     Home Equipment: Conservation officer, nature (2 wheels);Rollator (4 wheels);Shower seat;Cane - single point          Prior Functioning/Environment Prior Level of Function : Needs assist  Cognitive Assist : Mobility (cognitive);ADLs (cognitive) Mobility (Cognitive): Intermittent cues ADLs (Cognitive): Intermittent cues       Mobility Comments: using walker and with AMS wife assisting to prevent falls ADLs Comments: wife assists as needed depending on AMS        OT Problem List: Decreased strength;Decreased range of motion;Decreased activity tolerance;Impaired balance (sitting and/or standing);Decreased  cognition;Decreased safety awareness;Decreased knowledge of use of DME or AE;Decreased knowledge of precautions;Pain      OT Treatment/Interventions: Self-care/ADL training;Therapeutic exercise;Balance training;Patient/family education;Splinting;Therapeutic activities;DME and/or AE instruction    OT Goals(Current goals can be found in the care plan section) Acute Rehab OT Goals Patient Stated Goal: to go home OT Goal Formulation: With patient Time For Goal Achievement: 08/09/21 Potential to Achieve Goals: Good  OT Frequency: Min 2X/week    AM-PAC OT "6 Clicks" Daily Activity     Outcome Measure Help from another person eating meals?: None Help from another person taking care of personal grooming?: A Little Help from another person toileting, which includes using toliet, bedpan, or urinal?: A Little Help from another person bathing (including washing, rinsing, drying)?: A Little Help from another person to put on and taking off regular upper body clothing?: A Little Help from another person to put on and taking off regular lower body clothing?: A Little 6 Click Score: 19   End of Session Equipment Utilized During Treatment: Gait belt;Rolling walker (2 wheels) Nurse Communication: Mobility status  Activity Tolerance: Patient tolerated treatment well Patient left: in bed;with call bell/phone within reach;with bed alarm set  OT Visit Diagnosis: Unsteadiness on feet (R26.81);Other abnormalities of gait and mobility (R26.89);Muscle weakness (generalized) (M62.81);Pain                Time: 1430-1449 OT Time Calculation (min): 19 min Charges:  OT  Evaluation $OT Eval Moderate Complexity: 1 Mod   Andre Gallego A Elek Holderness 07/26/2021, 4:15 PM

## 2021-07-27 LAB — GLUCOSE, CAPILLARY
Glucose-Capillary: 124 mg/dL — ABNORMAL HIGH (ref 70–99)
Glucose-Capillary: 96 mg/dL (ref 70–99)

## 2021-07-27 LAB — CBC
HCT: 24.9 % — ABNORMAL LOW (ref 39.0–52.0)
Hemoglobin: 8.2 g/dL — ABNORMAL LOW (ref 13.0–17.0)
MCH: 28.5 pg (ref 26.0–34.0)
MCHC: 32.9 g/dL (ref 30.0–36.0)
MCV: 86.5 fL (ref 80.0–100.0)
Platelets: 300 10*3/uL (ref 150–400)
RBC: 2.88 MIL/uL — ABNORMAL LOW (ref 4.22–5.81)
RDW: 16 % — ABNORMAL HIGH (ref 11.5–15.5)
WBC: 10.9 10*3/uL — ABNORMAL HIGH (ref 4.0–10.5)
nRBC: 0 % (ref 0.0–0.2)

## 2021-07-27 LAB — BASIC METABOLIC PANEL
Anion gap: 7 (ref 5–15)
BUN: 9 mg/dL (ref 8–23)
CO2: 19 mmol/L — ABNORMAL LOW (ref 22–32)
Calcium: 8.2 mg/dL — ABNORMAL LOW (ref 8.9–10.3)
Chloride: 111 mmol/L (ref 98–111)
Creatinine, Ser: 1.05 mg/dL (ref 0.61–1.24)
GFR, Estimated: >60 mL/min (ref >60)
Glucose, Bld: 118 mg/dL — ABNORMAL HIGH (ref 70–99)
Potassium: 3.7 mmol/L (ref 3.5–5.1)
Sodium: 137 mmol/L (ref 135–145)

## 2021-07-27 LAB — MAGNESIUM: Magnesium: 1.9 mg/dL (ref 1.7–2.4)

## 2021-07-27 MED ORDER — SODIUM CHLORIDE 0.45 % IV SOLN: INTRAVENOUS | Status: DC

## 2021-07-27 MED ORDER — THIAMINE HCL 100 MG/ML IJ SOLN
500.0000 mg | INTRAVENOUS | Status: DC
  Administered 2021-07-27 – 2021-07-28 (×2): 500 mg via INTRAVENOUS
  Filled 2021-07-27 (×3): qty 5

## 2021-07-27 MED ORDER — QUETIAPINE FUMARATE 25 MG PO TABS
25.0000 mg | ORAL_TABLET | Freq: Every day | ORAL | Status: DC
  Administered 2021-07-27 – 2021-07-28 (×2): 25 mg via ORAL
  Filled 2021-07-27 (×2): qty 1

## 2021-07-27 NOTE — TOC Initial Note (Signed)
Transition of Care [facility]) - Initial/Assessment Note    Patient Details  Name: TITAN KARNER MRN: [id number] Date of Birth: 02/17/1931  [facility] CM/SW Contact:    Pollie Friar, RN Phone Number: 07/27/2021, 12:12 PM  Clinical Narrative:                 Patient with some confusion. CM called and spoke to his spouse over the phone. She has been assisting him with ADL's at home. She provides him his medications and transportation.  CM went over the recommendations for home therapy and she was in agreement. CM provided choice under [email or web address] She had no preference. CM will arrange prior to d/c.  TOC following.  Expected Discharge Plan: [place] Barriers to Discharge: Continued Medical Work up   Patient Goals and CMS Choice   CMS Medicare.gov Compare Post Acute Care list provided to:: Patient Represenative (must comment) Choice offered to / list presented to : Spouse  Expected Discharge Plan and Services Expected Discharge Plan: [place]   Discharge Planning Services: CM Consult Post Acute Care Choice: [place] arrangements for the past 2 months: Single Family Home                           HH Arranged: PT, OT, Nurse's Aide          Prior Living Arrangements/Services Living arrangements for the past 2 months: Single Family Home Lives with:: Spouse Patient language and need for interpreter reviewed:: Yes        Need for Family Participation in Patient Care: Yes (Comment) Care giver support system in place?: Yes (comment)   Criminal Activity/Legal Involvement Pertinent to Current Situation/Hospitalization: No - Comment as needed  Activities of Daily Living      Permission Sought/Granted                  Emotional Assessment Appearance:: Appears stated age     Orientation: : Oriented to Self   Psych Involvement: No (comment)  Admission diagnosis:  Altered mental status, unspecified  altered mental status type [X52.84] Acute metabolic encephalopathy [X32.44] AKI (acute kidney injury) ([place]) [N17.9] Patient Active Problem List   Diagnosis Date Noted   AKI (acute kidney injury) ([place]) 08/15/7251   Acute metabolic encephalopathy 66/44/0347   Palpitations 07/14/2021   Allergic rhinitis due to pollen 06/12/2021   GERD (gastroesophageal reflux disease) 42/59/5638   Toxic metabolic encephalopathy 75/64/3329   Neoplasm of brain causing mass effect on adjacent structures ([place]) 04/07/2021   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   Delirium 02/08/2021   Goals of care, counseling/discussion    Colitis due to Clostridioides difficile 12/31/2020   HOH (hard of hearing) 12/28/2020   Acute on chronic cholecystitis 12/28/2020   Arrhythmia 12/06/2020   Pulmonary nodule 12/06/2020   Preventative health care 10/12/2020   Macular degeneration, wet ([place]) 10/12/2020   BPH (benign prostatic hyperplasia) 01/13/2020   Neuropathy 02/18/2018   COPD (chronic obstructive pulmonary disease) ([place])    Chronic renal disease, stage III ([place])    Benign essential HTN 04/09/2017   CAD (coronary artery disease) 03/03/2015   Hyperlipidemia 03/03/2015   PCP:  Venia Carbon, MD Pharmacy:   [place],  - [address], [address], [place] [address] Phone: [phone] Fax: [place], [place]  [place] [address] Phone: [phone] Fax: [phone]     Social Determinants of Health (SDOH) Interventions    Readmission Risk Interventions No flowsheet data found.

## 2021-07-27 NOTE — Progress Notes (Addendum)
[facility] PROGRESS NOTE   Leon Estes KAJ:[id number] DOB: 16-Jul-1931 DOA: 07/24/2021  PCP: Venia Carbon, MD  Brief History/Interval Summary: Leon Estes is a 87 y.o. male with medical history significant of BPH; stage 3 CKD; COPD; CAD; HTN; HLD; recent diagnosis of Schwannoma; and recurrent C diff coliitis presenting with AMS, somnolence.  Apparently was quite agitated and delirious 2 days prior to admission.  Apparently had terrible diarrhea along with fever and chills last week for which he was prescribed vancomycin for presumed C. difficile by his PCP.  Apparently continue to have diarrhea despite taking vancomycin.  Hospitalized for further management.    Consultants: None  Procedures: None    Subjective/Interval History: Patient sleepy this morning.  On attempting to wake up, drifts back to sleep, also confused and unable to get much history from him.  According to RN, no BM since 9 PM.  Overnight had significant confusion/agitation and was attempting to get out of bed.   Assessment/Plan:  Acute metabolic encephalopathy - As per discussion with spouse, no formal diagnosis of dementia and at baseline patient quite coherent.  Review of previous admission in September 2022 shows similar presentation at which time extensive work-up including MRI brain, TSH, UA, ammonia, B12 were unremarkable.  Neurology had consulted, concerns for possible underlying dementia and and recommended outpatient neuropsychiatric testing.  Had been on Seroquel 25 mg at bedtime which seemed to have helped but was not on his med list on this admission. - Patient was quite delirious prior to admission, even hallucinating.    - CT head done in the ED did not show any acute findings.   - Acute metabolic encephalopathy likely multifactorial due to acute diarrheal illness, AKI, metabolic acidosis, multiple severe electrolyte abnormalities, sleep deprivation, complicating hard of hearing and  visual impairment and patient may have underlying undiagnosed dementia. - B12: 1344.  We will check RPR for completion.  Also check thiamine levels given poor oral intake and weight loss over several months from recurrent diarrhea. Started IV thiamine high-dose. - Initiated Seroquel 25 Mg nightly after confirming QTC is normal..  Delirium precautions.  Follow closely -UDS and blood alcohol level negative.   Acute diarrhea with abdominal pain/history of recurrent C. difficile Repeat abdominal CT 12/13 showed somewhat patulous colon and fluid-filled to the rectum, consistent with diarrheal illness, however not overtly distended and without acute inflammatory findings.  Bladder was unremarkable. He remains on a long vancomycin taper.  As per spouse, he completed an 18-day course of Dificid. Etiology of diarrhea is unclear.  Repeat stool C. difficile testing negative.  GI panel PCR was negative in September, will repeat.?  C. difficile related IBS.  Could consider Imodium if diarrhea does not improve. Gram variable rods in both aerobic and anaerobic bottles,?  Contaminant.  Acute kidney injury/metabolic acidosis Creatinine appears to have worsened.  Gone up to 1.54.   AKI likely secondary to prerenal from GI losses and poor oral intake.  Resolved after IV fluids.  Avoid nephrotoxins.  Metabolic acidosis resolved.  Hypokalemia Replaced.  Continue to monitor closely.  Hypomagnesemia Replaced.  Continue to monitor closely.  Leukocytosis Chest x-ray was clear.  UA was also clear and urine culture negative.  Blood cultures x2: Negative to date.  CT abdomen without acute findings.  May be stress response or due to acute diarrheal illness.  Resolved.  Normocytic anemia Hemoglobin has dropped from baseline of 10-8.6 today.  No overt bleeding reported.  Could be dilutional.  Follow CBC in a.m. and transfuse if hemoglobin 7 g or less. Anemia panel: Iron 24, TIBC 279, saturation ratio 9, ferritin 44,  folate 15.4 and B12: 1344. Hemoglobin now stable in the 8 g range.  Follow CBC in a.m.  Hypoglycemia Appears to have resolved.  Monitor closely and resuscitate per hypoglycemia protocol.  History of COPD Stable.  Continue inhalers.  Schwannoma To follow-up with neurosurgery in the outpatient setting.  Hyperlipidemia Continue Zoloft.  History of BPH Continue Flomax.  Bladder unremarkable on CT abdomen.   DVT Prophylaxis: Lovenox Code Status: DNR Family Communication: Discussed with spouse in detail, updated care and answered all questions. Disposition Plan: Home with home health therapies.  Status is: Inpatient  The patient will require care spanning > 2 midnights and should be moved to inpatient because: Acute kidney injury, abdominal pain      Medications: Scheduled:  acidophilus  1 capsule Oral Daily   aspirin EC  81 mg Oral Daily   enoxaparin (LOVENOX) injection  40 mg Subcutaneous Q24H   gabapentin  300 mg Oral QHS   mometasone-formoterol  2 puff Inhalation BID   pantoprazole  40 mg Oral QAC breakfast   simvastatin  40 mg Oral QPM   sodium chloride flush  3 mL Intravenous Q12H   tamsulosin  0.4 mg Oral QHS   vancomycin  125 mg Oral QID   Followed by   Derrill Memo ON 08/04/2021] vancomycin  125 mg Oral BID   Followed by   Derrill Memo ON 08/11/2021] vancomycin  125 mg Oral Daily   Followed by   Derrill Memo ON 08/18/2021] vancomycin  125 mg Oral QODAY   Continuous:   IRJ:[id number] **OR** acetaminophen, hydrALAZINE, ondansetron **OR** ondansetron (ZOFRAN) IV, traMADol  Antibiotics: Anti-infectives (From admission, onward)    Start     Dose/Rate Route Frequency Ordered Stop   08/18/21 1000  vancomycin (VANCOCIN) capsule 125 mg       See Hyperspace for full Linked Orders Report.   125 mg Oral Every other day 07/24/21 2021 10/13/21 0959   08/11/21 1000  vancomycin (VANCOCIN) capsule 125 mg       See Hyperspace for full Linked Orders Report.   125 mg Oral Daily 07/24/21  2021 08/18/21 0959   08/04/21 1000  vancomycin (VANCOCIN) capsule 125 mg       See Hyperspace for full Linked Orders Report.   125 mg Oral 2 times daily 07/24/21 2021 08/11/21 0959   07/24/21 2200  vancomycin (VANCOCIN) capsule 125 mg       Note to Pharmacy: Four times a day for 2 weeks. Then twice a day for 1 week, then daily for 1 week. Finally, continue it every other day for 8 weeks  See Hyperspace for full Linked Orders Report.   125 mg Oral 4 times daily 07/24/21 2021 08/04/21 0959   07/24/21 1746  vancomycin (VANCOCIN) capsule 125 mg  Status:  Discontinued       Note to Pharmacy: OP SIG:For 2 weeks. Then twice a day for 1 week, then daily for 1 week. Finally, continue it every other day for 8 weeks Patient taking differently: 1 capsule qid x 2 weeks, then 1 capsule bid x 1 week, then 1 capsule qd x 1 week, then 1 capsule every other day x 8 weeks.     125 mg Oral See admin instructions 07/24/21 1746 07/24/21 2021       Objective:  Vital Signs  Vitals:   07/27/21 0406 07/27/21 0109 07/27/21 3235  07/27/21 1146  BP: 139/72  123/62 131/61  Pulse: 85  83 86  Resp: 17  16 16   Temp: 98.9 F (37.2 C)  98.5 F (36.9 C) 98.1 F (36.7 C)  TempSrc: Oral  Oral Oral  SpO2: 98% 97% 98% 98%  Weight:      Height:        Intake/Output Summary (Last 24 hours) at 07/27/2021 1449 Last data filed at 07/27/2021 1761 Gross per 24 hour  Intake 607.5 ml  Output 1250 ml  Net -642.5 ml   Filed Weights   07/24/21 0313  Weight: 72.6 kg    General exam: Elderly male, moderately built and frail, lying comfortably propped up in bed without distress.  Respiratory system: Clear to auscultation.  No increased work of breathing. Cardiovascular system: S1 and S2 heard, RRR.  No JVD, murmurs or pedal edema.  Telemetry personally reviewed: Sinus rhythm. Gastrointestinal system: Abdomen is nondistended, soft and no tenderness today.  Normal bowel sounds heard. Central nervous system: Somnolent but  arousable, not oriented and drifts back to sleep. No focal neurological deficits. Extremities: Symmetric 5 x 5 power. Skin: No rashes, lesions or ulcers Psychiatry: Judgement and insight appear somewhat impaired. Mood & affect appropriate.     Lab Results:  Data Reviewed: I have personally reviewed following labs and imaging studies  CBC: Recent Labs  Lab 07/24/21 0324 07/24/21 0346 07/25/21 0638 07/26/21 0333 07/26/21 1455 07/27/21 0220  WBC 10.2  --  20.2* 11.3* 11.7* 10.9*  NEUTROABS 4.8  --   --  7.5  --   --   HGB 10.9* 10.9* 10.1* 8.6* 8.9* 8.2*  HCT 34.6* 32.0* 31.5* 26.4* 27.3* 24.9*  MCV 88.9  --  88.2 86.3 86.1 86.5  PLT 287  --  317 300 342 607    Basic Metabolic Panel: Recent Labs  Lab 07/24/21 0324 07/24/21 0346 07/24/21 0505 07/25/21 0638 07/26/21 0333 07/26/21 1455 07/27/21 0220  NA 139 143  --  139 136 136 137  K 2.6* 2.6*  --  2.8* 2.8* 3.1* 3.7  CL 106 106  --  108 108 110 111  CO2 24  --   --  14* 22 20* 19*  GLUCOSE 86 91  --  57* 114* 129* 118*  BUN 16 17  --  9 11 11 9   CREATININE 1.21 1.20  --  1.54* 1.07 1.11 1.05  CALCIUM 8.8*  --   --  8.4* 8.2* 8.5* 8.2*  MG  --   --  1.8  --  1.5* 2.2 1.9    GFR: Estimated Creatinine Clearance: 49.9 mL/min (by C-G formula based on SCr of 1.05 mg/dL).  Liver Function Tests: Recent Labs  Lab 07/24/21 0324  AST 30  ALT 23  ALKPHOS 73  BILITOT 0.6  PROT 6.6  ALBUMIN 3.2*     Coagulation Profile: Recent Labs  Lab 07/24/21 0324  INR 1.1     Recent Results (from the past 240 hour(s))  Blood Culture (routine x 2)     Status: Abnormal   Collection Time: 07/24/21  3:24 AM   Specimen: BLOOD  Result Value Ref Range Status   Specimen Description BLOOD RIGHT ANTECUBITAL  Final   Special Requests   Final    BOTTLES DRAWN AEROBIC AND ANAEROBIC Blood Culture adequate volume   Culture  Setup Time (A)  Final    GRAM VARIABLE ROD IN BOTH AEROBIC AND ANAEROBIC BOTTLES CRITICAL RESULT CALLED  TO, READ BACK BY AND  VERIFIED WITH: PHARMD J Iran [id number] AT 1230 BY CM    Culture   Final    GRAM POSITIVE RODS CALL IF FURTHER WORK UP NEEDED Performed at [facility], [place] [address] [place], [place] [address]    Report Status 07/26/2021 FINAL  Final  Blood Culture ID Panel (Reflexed)     Status: None   Collection Time: 07/24/21  3:24 AM  Result Value Ref Range Status   Enterococcus faecalis NOT DETECTED NOT DETECTED Final   Enterococcus Faecium NOT DETECTED NOT DETECTED Final   Listeria monocytogenes NOT DETECTED NOT DETECTED Final   Staphylococcus species NOT DETECTED NOT DETECTED Final   Staphylococcus aureus (BCID) NOT DETECTED NOT DETECTED Final   Staphylococcus epidermidis NOT DETECTED NOT DETECTED Final   Staphylococcus lugdunensis NOT DETECTED NOT DETECTED Final   Streptococcus species NOT DETECTED NOT DETECTED Final   Streptococcus agalactiae NOT DETECTED NOT DETECTED Final   Streptococcus pneumoniae NOT DETECTED NOT DETECTED Final   Streptococcus pyogenes NOT DETECTED NOT DETECTED Final   A.calcoaceticus-baumannii NOT DETECTED NOT DETECTED Final   Bacteroides fragilis NOT DETECTED NOT DETECTED Final   Enterobacterales NOT DETECTED NOT DETECTED Final   Enterobacter cloacae complex NOT DETECTED NOT DETECTED Final   Escherichia coli NOT DETECTED NOT DETECTED Final   Klebsiella aerogenes NOT DETECTED NOT DETECTED Final   Klebsiella oxytoca NOT DETECTED NOT DETECTED Final   Klebsiella pneumoniae NOT DETECTED NOT DETECTED Final   Proteus species NOT DETECTED NOT DETECTED Final   Salmonella species NOT DETECTED NOT DETECTED Final   Serratia marcescens NOT DETECTED NOT DETECTED Final   Haemophilus influenzae NOT DETECTED NOT DETECTED Final   Neisseria meningitidis NOT DETECTED NOT DETECTED Final   Pseudomonas aeruginosa NOT DETECTED NOT DETECTED Final   Stenotrophomonas maltophilia NOT DETECTED NOT DETECTED Final   Candida albicans NOT DETECTED NOT DETECTED Final    Candida auris NOT DETECTED NOT DETECTED Final   Candida glabrata NOT DETECTED NOT DETECTED Final   Candida krusei NOT DETECTED NOT DETECTED Final   Candida parapsilosis NOT DETECTED NOT DETECTED Final   Candida tropicalis NOT DETECTED NOT DETECTED Final   Cryptococcus neoformans/gattii NOT DETECTED NOT DETECTED Final    Comment: Performed at [facility], [address], [place], [place] [address]  Resp Panel by RT-PCR (Flu A&B, Covid) Nasopharyngeal Swab     Status: None   Collection Time: 07/24/21  5:10 AM   Specimen: Nasopharyngeal Swab; Nasopharyngeal(NP) swabs in vial transport medium  Result Value Ref Range Status   SARS Coronavirus 2 by RT PCR NEGATIVE NEGATIVE Final    Comment: (NOTE) SARS-CoV-2 target nucleic acids are NOT DETECTED.  The SARS-CoV-2 RNA is generally detectable in upper respiratory specimens during the acute phase of infection. The lowest concentration of SARS-CoV-2 viral copies this assay can detect is 138 copies/mL. A negative result does not preclude SARS-Cov-2 infection and should not be used as the sole basis for treatment or other patient management decisions. A negative result may occur with  improper specimen collection/handling, submission of specimen other than nasopharyngeal swab, presence of viral mutation(s) within the areas targeted by this assay, and inadequate number of viral copies(<138 copies/mL). A negative result must be combined with clinical observations, patient history, and epidemiological information. The expected result is Negative.  Fact Sheet for Patients:  EntrepreneurPulse.com.au  Fact Sheet for Healthcare Providers:  IncredibleEmployment.be  This test is no t yet approved or cleared by the [place] and  has been authorized  for detection and/or diagnosis of SARS-CoV-2 by FDA under an Emergency Use Authorization (EUA). This EUA will remain  in effect (meaning this test can be  used) for the duration of the COVID-19 declaration under Section 564(b)(1) of the Act, 21 U.S.C.section 360bbb-3(b)(1), unless the authorization is terminated  or revoked sooner.       Influenza A by PCR NEGATIVE NEGATIVE Final   Influenza B by PCR NEGATIVE NEGATIVE Final    Comment: (NOTE) The Xpert Xpress SARS-CoV-2/FLU/RSV plus assay is intended as an aid in the diagnosis of influenza from Nasopharyngeal swab specimens and should not be used as a sole basis for treatment. Nasal washings and aspirates are unacceptable for Xpert Xpress SARS-CoV-2/FLU/RSV testing.  Fact Sheet for Patients: EntrepreneurPulse.com.au  Fact Sheet for Healthcare Providers: IncredibleEmployment.be  This test is not yet approved or cleared by the [place] FDA and has been authorized for detection and/or diagnosis of SARS-CoV-2 by FDA under an Emergency Use Authorization (EUA). This EUA will remain in effect (meaning this test can be used) for the duration of the COVID-19 declaration under Section 564(b)(1) of the Act, 21 U.S.C. section 360bbb-3(b)(1), unless the authorization is terminated or revoked.  Performed at [facility], [place] [address], [place], [place] [address]   Urine Culture     Status: None   Collection Time: 07/24/21  5:42 AM   Specimen: In/Out Cath Urine  Result Value Ref Range Status   Specimen Description IN/OUT CATH URINE  Final   Special Requests NONE  Final   Culture   Final    NO GROWTH Performed at [facility], [place] [address] [place], [place] [address]    Report Status 07/25/2021 FINAL  Final  C Difficile Quick Screen w PCR reflex     Status: None   Collection Time: 07/24/21 10:46 AM   Specimen: STOOL  Result Value Ref Range Status   C Diff antigen NEGATIVE NEGATIVE Final   C Diff toxin NEGATIVE NEGATIVE Final   C Diff interpretation No C. difficile detected.  Final    Comment: Performed at [facility],  [place] [address], [place], [place] [address]  Blood Culture (routine x 2)     Status: None (Preliminary result)   Collection Time: 07/24/21 11:24 AM   Specimen: BLOOD  Result Value Ref Range Status   Specimen Description BLOOD BLOOD RIGHT WRIST  Final   Special Requests   Final    BOTTLES DRAWN AEROBIC ONLY Blood Culture adequate volume   Culture   Final    NO GROWTH 3 DAYS Performed at [facility], [place] [address], [place], [place] [address]    Report Status PENDING  Incomplete      Radiology Studies: No results found.     LOS: 2 days   Vernell Leep, MD,  FACP, [facility], [facility], [facility] (Care Management Physician Certified) [facility] [place]  To contact the attending provider between 7A-7P or the covering provider during after hours 7P-7A, please log into the web site [email or web address] and access using universal [place] password for that web site. If you do not have the password, please call the hospital operator.

## 2021-07-27 NOTE — Care Management Important Message (Signed)
Important Message  Patient Details  Name: Leon Estes MRN: [id number] Date of Birth: Apr 20, 1931   Medicare Important Message Given:  Yes     Orbie Pyo 07/27/2021, 2:20 PM

## 2021-07-27 NOTE — Progress Notes (Signed)
Physical Therapy Treatment Patient Details Name: Leon Estes MRN: [id number] DOB: Oct 14, 1930 Today's Date: 07/27/2021   History of Present Illness Leon Estes is a 87 y.o. male with medical history significant of BPH; stage 3 CKD; COPD; CAD; HTN; HLD; recent diagnosis of Schwannoma; and recurrent C diff coliitis presenting with AMS, somnolence.    PT Comments    Patient progressing with ambulation distance, but more confused this session.  I had woke him and he was sad reporting his wife cheating on him.  Attempted to re-orient as she was just here yesterday and very supportive, etc.  Patient appropriate for home with HHPT, though concern for delirium vs progressive dementia.  Wife needs supportive services so also recommending [place] aide.  PT will continue to follow acutely.    Recommendations for follow up therapy are one component of a multi-disciplinary discharge planning process, led by the attending physician.  Recommendations may be updated based on patient status, additional functional criteria and insurance authorization.  Follow Up Recommendations  Home health PT [facility] aide)     Assistance Recommended at Discharge Frequent or constant Supervision/Assistance  Equipment Recommendations  Wheelchair cushion (measurements PT);Wheelchair (measurements PT)    Recommendations for Other Services       Precautions / Restrictions Precautions Precautions: Fall Precaution Comments: frequent loose stools     Mobility  Bed Mobility Overal bed mobility: Needs Assistance Bed Mobility: Supine to Sit     Supine to sit: HOB elevated;Min guard     General bed mobility comments: slow to rise, assist for lines/balance    Transfers Overall transfer level: Needs assistance Equipment used: Rolling walker (2 wheels);None Transfers: Sit to/from Stand;Bed to chair/wheelchair/BSC Sit to Stand: Min guard     Step pivot transfers: Min assist     General transfer comment: assist for  stepping to [facility] and then to stand to RW with cues for hand placement    Ambulation/Gait Ambulation/Gait assistance: Min guard Gait Distance (Feet): 150 Feet Assistive device: Rolling walker (2 wheels) Gait Pattern/deviations: Step-through pattern;Decreased stride length;Trunk flexed       General Gait Details: assist for balance, safety with walker on turns, cues for posture   Stairs             Wheelchair Mobility    Modified Rankin (Stroke Patients Only)       Balance Overall balance assessment: Needs assistance   Sitting balance-Leahy Scale: Good Sitting balance - Comments: leaning over on BSC for toilet hygiene   Standing balance support: Bilateral upper extremity supported Standing balance-Leahy Scale: Poor Standing balance comment: assist for hygiene in standing after toileting on BSC due to unable to effectively clean and balance                            Cognition Arousal/Alertness: Awake/alert Behavior During Therapy: [facility] for tasks assessed/performed   Area of Impairment: Orientation;Memory;Safety/judgement                 Orientation Level: Disoriented to;Place;Time   Memory: Decreased short-term memory   Safety/Judgement: Decreased awareness of deficits     General Comments: sad since he thinks his wife of almost 77 years is cheating on him, encouraged and tried to re-orient        Exercises      General Comments General comments (skin integrity, edema, etc.): HR 110 with ambulation      Pertinent Vitals/Pain Faces Pain Scale: No hurt  Home Living                          Prior Function            PT Goals (current goals can now be found in the care plan section) Progress towards PT goals: Progressing toward goals    Frequency    Min 3X/week      PT Plan Current plan remains appropriate    Co-evaluation              AM-PAC PT "6 Clicks" Mobility   Outcome Measure  Help needed  turning from your back to your side while in a flat bed without using bedrails?: A Little Help needed moving from lying on your back to sitting on the side of a flat bed without using bedrails?: A Little Help needed moving to and from a bed to a chair (including a wheelchair)?: A Little Help needed standing up from a chair using your arms (e.g., wheelchair or bedside chair)?: A Little Help needed to walk in hospital room?: A Little Help needed climbing 3-5 steps with a railing? : A Lot 6 Click Score: 17    End of Session Equipment Utilized During Treatment: Gait belt Activity Tolerance: Patient tolerated treatment well Patient left: in chair;with call bell/phone within reach;with chair alarm set   PT Visit Diagnosis: Other abnormalities of gait and mobility (R26.89);Other symptoms and signs involving the nervous system (R29.898);Muscle weakness (generalized) (M62.81)     Time: 6861-6837 PT Time Calculation (min) (ACUTE ONLY): 33 min  Charges:  $Gait Training: 8-22 mins $Therapeutic Activity: 8-22 mins                     Magda Kiel, PT Acute Rehabilitation Services Pager:[phone] Office:[phone] 07/27/2021    Reginia Naas 07/27/2021, 3:20 PM

## 2021-07-28 LAB — BASIC METABOLIC PANEL
Anion gap: 6 (ref 5–15)
BUN: 10 mg/dL (ref 8–23)
CO2: 21 mmol/L — ABNORMAL LOW (ref 22–32)
Calcium: 8.5 mg/dL — ABNORMAL LOW (ref 8.9–10.3)
Chloride: 110 mmol/L (ref 98–111)
Creatinine, Ser: 1.12 mg/dL (ref 0.61–1.24)
GFR, Estimated: >60 mL/min (ref >60)
Glucose, Bld: 98 mg/dL (ref 70–99)
Potassium: 3.3 mmol/L — ABNORMAL LOW (ref 3.5–5.1)
Sodium: 137 mmol/L (ref 135–145)

## 2021-07-28 LAB — GASTROINTESTINAL PANEL BY PCR, STOOL (REPLACES STOOL CULTURE)

## 2021-07-28 LAB — GLUCOSE, CAPILLARY
Glucose-Capillary: 85 mg/dL (ref 70–99)
Glucose-Capillary: 107 mg/dL — ABNORMAL HIGH (ref 70–99)

## 2021-07-28 LAB — MAGNESIUM: Magnesium: 1.8 mg/dL (ref 1.7–2.4)

## 2021-07-28 LAB — RPR: RPR Ser Ql: NONREACTIVE

## 2021-07-28 MED ORDER — POTASSIUM CHLORIDE CRYS ER 20 MEQ PO TBCR
30.0000 meq | EXTENDED_RELEASE_TABLET | ORAL | Status: AC
  Administered 2021-07-28 (×2): 30 meq via ORAL
  Filled 2021-07-28 (×2): qty 1

## 2021-07-28 NOTE — Progress Notes (Signed)
[facility] PROGRESS NOTE   Leon Estes ZJI:[id number] DOB: [birth year] DOA: 07/24/2021  PCP: Venia Carbon, MD  Brief History/Interval Summary: Leon Estes is a 87 y.o. male with medical history significant of BPH; stage 3 CKD; COPD; CAD; HTN; HLD; recent diagnosis of Schwannoma; and recurrent C diff coliitis presenting with AMS, somnolence.  Apparently was quite agitated and delirious 2 days prior to admission.  Apparently had terrible diarrhea along with fever and chills last week for which he was prescribed vancomycin for presumed C. difficile by his PCP.  Apparently continue to have diarrhea despite taking vancomycin.  Hospitalized for further management.    Consultants: None  Procedures: None    Subjective/Interval History: Interviewed and examined patient along with his spouse and oldest daughter at bedside.  Reports that he is feeling better and denies any complaints.  As per family, mental status has significantly improved and back to baseline.  Per nursing, has had only 1 BM in the last 24 hours.   Assessment/Plan:  Acute metabolic encephalopathy - As per discussion with spouse, no formal diagnosis of dementia and at baseline patient quite coherent.  Review of previous admission in September 2022 shows similar presentation at which time extensive work-up including MRI brain, TSH, UA, ammonia, B12 were unremarkable.  Neurology had consulted, concerns for possible underlying dementia and and recommended outpatient neuropsychiatric testing.  Had been on Seroquel 25 mg at bedtime which seemed to have helped but was not on his med list on this admission. - Patient was quite delirious prior to admission, even hallucinating.    - CT head done in the ED did not show any acute findings.   - Acute metabolic encephalopathy likely multifactorial due to acute diarrheal illness, AKI, metabolic acidosis, multiple severe electrolyte abnormalities, sleep deprivation,  complicating hard of hearing and visual impairment and patient may have underlying undiagnosed dementia. - B12: 1344.  RPR non reactive.  Checking thiamine levels (pending) given poor oral intake and weight loss over several months from recurrent diarrhea. Started IV thiamine high-dose, day 2/3. - Initiated Seroquel 25 Mg nightly after confirming QTC of 476 ms on 12/15.  Delirium precautions.  Clinically significantly improved. -UDS and blood alcohol level negative.   Acute diarrhea with abdominal pain/history of recurrent C. difficile Repeat abdominal CT 12/13 showed somewhat patulous colon and fluid-filled to the rectum, consistent with diarrheal illness, however not overtly distended and without acute inflammatory findings.  Bladder was unremarkable. He remains on a long vancomycin taper.  As per spouse, he completed an 18-day course of Dificid. Etiology of diarrhea is unclear.  Repeat stool C. difficile testing negative.  GI panel PCR negative.?  C. difficile related IBS.  Could consider Imodium if diarrhea does not improve. Gram variable rods in both aerobic and anaerobic bottles,?  Contaminant-discussed with ID pharmacist who agrees that this is likely a skin contaminant. Diarrhea improved.  Acute kidney injury/metabolic acidosis AKI likely secondary to prerenal from GI losses and poor oral intake.  Resolved after IV fluids.  Avoid nephrotoxins  Hypokalemia Continue to replace aggressively and follow.  Hypomagnesemia Replaced.  Continue to monitor closely.  Leukocytosis Chest x-ray was clear.  UA was also clear and urine culture negative.  Blood cultures x2: Negative to date.  CT abdomen without acute findings.  May be stress response or due to acute diarrheal illness.  Resolved.  Normocytic anemia Hemoglobin has dropped from baseline of 10-8.6 today.  No overt bleeding reported.  Could be dilutional.  Follow CBC in a.m. and transfuse if hemoglobin 7 g or less. Anemia panel: Iron 24,  TIBC 279, saturation ratio 9, ferritin 44, folate 15.4 and B12: 1344. Hemoglobin now stable in the 8 g range.  Follow CBC in a.m.  Hypoglycemia Appears to have resolved.  Monitor closely and resuscitate per hypoglycemia protocol.  History of COPD Stable.  Continue inhalers.  Schwannoma To follow-up with neurosurgery in the outpatient setting.  Hyperlipidemia Continue Zoloft.  History of BPH Continue Flomax.  Bladder unremarkable on CT abdomen.   DVT Prophylaxis: Lovenox Code Status: DNR Family Communication: Discussed with spouse and daughter at bedside in detail, updated care and answered all questions. Disposition Plan: Home with home health therapies, hopefully 12/17.  Status is: Inpatient  The patient will require care spanning > 2 midnights and should be moved to inpatient because: Acute kidney injury, abdominal pain      Medications: Scheduled:  acidophilus  1 capsule Oral Daily   aspirin EC  81 mg Oral Daily   enoxaparin (LOVENOX) injection  40 mg Subcutaneous Q24H   gabapentin  300 mg Oral QHS   mometasone-formoterol  2 puff Inhalation BID   pantoprazole  40 mg Oral QAC breakfast   QUEtiapine  25 mg Oral QHS   simvastatin  40 mg Oral QPM   sodium chloride flush  3 mL Intravenous Q12H   tamsulosin  0.4 mg Oral QHS   vancomycin  125 mg Oral QID   Followed by   Derrill Memo ON 08/04/2021] vancomycin  125 mg Oral BID   Followed by   Derrill Memo ON 08/11/2021] vancomycin  125 mg Oral Daily   Followed by   Derrill Memo ON 08/18/2021] vancomycin  125 mg Oral QODAY   Continuous:  thiamine injection Stopped (07/27/21 1717)    XTG:GYIRSWNIOEVOJ **OR** acetaminophen, hydrALAZINE, ondansetron **OR** ondansetron (ZOFRAN) IV, traMADol  Antibiotics: Anti-infectives (From admission, onward)    Start     Dose/Rate Route Frequency Ordered Stop   08/18/21 1000  vancomycin (VANCOCIN) capsule 125 mg       See Hyperspace for full Linked Orders Report.   125 mg Oral Every other day  07/24/21 2021 10/13/21 0959   08/11/21 1000  vancomycin (VANCOCIN) capsule 125 mg       See Hyperspace for full Linked Orders Report.   125 mg Oral Daily 07/24/21 2021 08/18/21 0959   08/04/21 1000  vancomycin (VANCOCIN) capsule 125 mg       See Hyperspace for full Linked Orders Report.   125 mg Oral 2 times daily 07/24/21 2021 08/11/21 0959   07/24/21 2200  vancomycin (VANCOCIN) capsule 125 mg       Note to Pharmacy: Four times a day for 2 weeks. Then twice a day for 1 week, then daily for 1 week. Finally, continue it every other day for 8 weeks  See Hyperspace for full Linked Orders Report.   125 mg Oral 4 times daily 07/24/21 2021 08/04/21 0959   07/24/21 1746  vancomycin (VANCOCIN) capsule 125 mg  Status:  Discontinued       Note to Pharmacy: OP SIG:For 2 weeks. Then twice a day for 1 week, then daily for 1 week. Finally, continue it every other day for 8 weeks Patient taking differently: 1 capsule qid x 2 weeks, then 1 capsule bid x 1 week, then 1 capsule qd x 1 week, then 1 capsule every other day x 8 weeks.     125 mg Oral See admin instructions 07/24/21 1746 07/24/21  2021       Objective:  Vital Signs  Vitals:   07/28/21 0420 07/28/21 0758 07/28/21 0816 07/28/21 1148  BP: (!) 103/51  (!) 141/66 (!) 134/57  Pulse: 78  82 83  Resp: 18  18 20   Temp: 97.9 F (36.6 C)  98.3 F (36.8 C) 98.7 F (37.1 C)  TempSrc:   Oral Oral  SpO2: 97% 98% 97% 99%  Weight:      Height:        Intake/Output Summary (Last 24 hours) at 07/28/2021 1555 Last data filed at 07/28/2021 1430 Gross per 24 hour  Intake 1817.33 ml  Output 715 ml  Net 1102.33 ml   Filed Weights   07/24/21 0313  Weight: 72.6 kg    General exam: Elderly male, moderately built and frail, sitting up comfortably in reclining chair without distress. Respiratory system: Clear to auscultation.  No increased work of breathing. Cardiovascular system: S1 and S2 heard, RRR.  No JVD, murmurs or pedal edema.  Telemetry  personally reviewed: Sinus rhythm with BBB morphology. Gastrointestinal system: Abdomen is nondistended, soft and no tenderness today.  Normal bowel sounds heard. Central nervous system: Alert and oriented to person, place, partly to time.  Answers questions appropriately. Extremities: Symmetric 5 x 5 power. Skin: No rashes, lesions or ulcers Psychiatry: Judgement and insight impaired but improved compared to prior. Mood & affect appropriate.     Lab Results:  Data Reviewed: I have personally reviewed following labs and imaging studies  CBC: Recent Labs  Lab 07/24/21 0324 07/24/21 0346 07/25/21 0638 07/26/21 0333 07/26/21 1455 07/27/21 0220  WBC 10.2  --  20.2* 11.3* 11.7* 10.9*  NEUTROABS 4.8  --   --  7.5  --   --   HGB 10.9* 10.9* 10.1* 8.6* 8.9* 8.2*  HCT 34.6* 32.0* 31.5* 26.4* 27.3* 24.9*  MCV 88.9  --  88.2 86.3 86.1 86.5  PLT 287  --  317 300 342 426    Basic Metabolic Panel: Recent Labs  Lab 07/24/21 0505 07/25/21 0638 07/26/21 0333 07/26/21 1455 07/27/21 0220 07/28/21 0242  NA  --  139 136 136 137 137  K  --  2.8* 2.8* 3.1* 3.7 3.3*  CL  --  108 108 110 111 110  CO2  --  14* 22 20* 19* 21*  GLUCOSE  --  57* 114* 129* 118* 98  BUN  --  9 11 11 9 10   CREATININE  --  1.54* 1.07 1.11 1.05 1.12  CALCIUM  --  8.4* 8.2* 8.5* 8.2* 8.5*  MG 1.8  --  1.5* 2.2 1.9 1.8    GFR: Estimated Creatinine Clearance: 46.8 mL/min (by C-G formula based on SCr of 1.12 mg/dL).  Liver Function Tests: Recent Labs  Lab 07/24/21 0324  AST 30  ALT 23  ALKPHOS 73  BILITOT 0.6  PROT 6.6  ALBUMIN 3.2*     Coagulation Profile: Recent Labs  Lab 07/24/21 0324  INR 1.1     Recent Results (from the past 240 hour(s))  Blood Culture (routine x 2)     Status: Abnormal   Collection Time: 07/24/21  3:24 AM   Specimen: BLOOD  Result Value Ref Range Status   Specimen Description BLOOD RIGHT ANTECUBITAL  Final   Special Requests   Final    BOTTLES DRAWN AEROBIC AND  ANAEROBIC Blood Culture adequate volume   Culture  Setup Time (A)  Final    GRAM VARIABLE ROD IN BOTH AEROBIC AND ANAEROBIC  BOTTLES CRITICAL RESULT CALLED TO, READ BACK BY AND VERIFIED WITH: PHARMD J Iran [id number] AT 70 BY CM    Culture   Final    GRAM POSITIVE RODS CALL IF FURTHER WORK UP NEEDED Performed at [facility], [place] [address] [place], [place] [address]    Report Status 07/26/2021 FINAL  Final  Blood Culture ID Panel (Reflexed)     Status: None   Collection Time: 07/24/21  3:24 AM  Result Value Ref Range Status   Enterococcus faecalis NOT DETECTED NOT DETECTED Final   Enterococcus Faecium NOT DETECTED NOT DETECTED Final   Listeria monocytogenes NOT DETECTED NOT DETECTED Final   Staphylococcus species NOT DETECTED NOT DETECTED Final   Staphylococcus aureus (BCID) NOT DETECTED NOT DETECTED Final   Staphylococcus epidermidis NOT DETECTED NOT DETECTED Final   Staphylococcus lugdunensis NOT DETECTED NOT DETECTED Final   Streptococcus species NOT DETECTED NOT DETECTED Final   Streptococcus agalactiae NOT DETECTED NOT DETECTED Final   Streptococcus pneumoniae NOT DETECTED NOT DETECTED Final   Streptococcus pyogenes NOT DETECTED NOT DETECTED Final   A.calcoaceticus-baumannii NOT DETECTED NOT DETECTED Final   Bacteroides fragilis NOT DETECTED NOT DETECTED Final   Enterobacterales NOT DETECTED NOT DETECTED Final   Enterobacter cloacae complex NOT DETECTED NOT DETECTED Final   Escherichia coli NOT DETECTED NOT DETECTED Final   Klebsiella aerogenes NOT DETECTED NOT DETECTED Final   Klebsiella oxytoca NOT DETECTED NOT DETECTED Final   Klebsiella pneumoniae NOT DETECTED NOT DETECTED Final   Proteus species NOT DETECTED NOT DETECTED Final   Salmonella species NOT DETECTED NOT DETECTED Final   Serratia marcescens NOT DETECTED NOT DETECTED Final   Haemophilus influenzae NOT DETECTED NOT DETECTED Final   Neisseria meningitidis NOT DETECTED NOT DETECTED Final   Pseudomonas  aeruginosa NOT DETECTED NOT DETECTED Final   Stenotrophomonas maltophilia NOT DETECTED NOT DETECTED Final   Candida albicans NOT DETECTED NOT DETECTED Final   Candida auris NOT DETECTED NOT DETECTED Final   Candida glabrata NOT DETECTED NOT DETECTED Final   Candida krusei NOT DETECTED NOT DETECTED Final   Candida parapsilosis NOT DETECTED NOT DETECTED Final   Candida tropicalis NOT DETECTED NOT DETECTED Final   Cryptococcus neoformans/gattii NOT DETECTED NOT DETECTED Final    Comment: Performed at [facility], [address], [place],  [address]  Resp Panel by RT-PCR (Flu A&B, Covid) Nasopharyngeal Swab     Status: None   Collection Time: 07/24/21  5:10 AM   Specimen: Nasopharyngeal Swab; Nasopharyngeal(NP) swabs in vial transport medium  Result Value Ref Range Status   SARS Coronavirus 2 by RT PCR NEGATIVE NEGATIVE Final    Comment: (NOTE) SARS-CoV-2 target nucleic acids are NOT DETECTED.  The SARS-CoV-2 RNA is generally detectable in upper respiratory specimens during the acute phase of infection. The lowest concentration of SARS-CoV-2 viral copies this assay can detect is 138 copies/mL. A negative result does not preclude SARS-Cov-2 infection and should not be used as the sole basis for treatment or other patient management decisions. A negative result may occur with  improper specimen collection/handling, submission of specimen other than nasopharyngeal swab, presence of viral mutation(s) within the areas targeted by this assay, and inadequate number of viral copies(<138 copies/mL). A negative result must be combined with clinical observations, patient history, and epidemiological information. The expected result is Negative.  Fact Sheet for Patients:  EntrepreneurPulse.com.au  Fact Sheet for Healthcare Providers:  IncredibleEmployment.be  This test is no t yet approved or cleared by  the [employer] and  has been  authorized for detection and/or diagnosis of SARS-CoV-2 by FDA under an Emergency Use Authorization (EUA). This EUA will remain  in effect (meaning this test can be used) for the duration of the COVID-19 declaration under Section 564(b)(1) of the Act, 21 U.S.C.section 360bbb-3(b)(1), unless the authorization is terminated  or revoked sooner.       Influenza A by PCR NEGATIVE NEGATIVE Final   Influenza B by PCR NEGATIVE NEGATIVE Final    Comment: (NOTE) The Xpert Xpress SARS-CoV-2/FLU/RSV plus assay is intended as an aid in the diagnosis of influenza from Nasopharyngeal swab specimens and should not be used as a sole basis for treatment. Nasal washings and aspirates are unacceptable for Xpert Xpress SARS-CoV-2/FLU/RSV testing.  Fact Sheet for Patients: EntrepreneurPulse.com.au  Fact Sheet for Healthcare Providers: IncredibleEmployment.be  This test is not yet approved or cleared by the [place] FDA and has been authorized for detection and/or diagnosis of SARS-CoV-2 by FDA under an Emergency Use Authorization (EUA). This EUA will remain in effect (meaning this test can be used) for the duration of the COVID-19 declaration under Section 564(b)(1) of the Act, 21 U.S.C. section 360bbb-3(b)(1), unless the authorization is terminated or revoked.  Performed at [facility], [place] [address], [place], [place] [address]   Urine Culture     Status: None   Collection Time: 07/24/21  5:42 AM   Specimen: In/Out Cath Urine  Result Value Ref Range Status   Specimen Description IN/OUT CATH URINE  Final   Special Requests NONE  Final   Culture   Final    NO GROWTH Performed at [facility], [place] [address], [place], [place] [address]    Report Status 07/25/2021 FINAL  Final  C Difficile Quick Screen w PCR reflex     Status: None   Collection Time: 07/24/21 10:46 AM   Specimen: STOOL  Result Value Ref Range Status   C Diff antigen  NEGATIVE NEGATIVE Final   C Diff toxin NEGATIVE NEGATIVE Final   C Diff interpretation No C. difficile detected.  Final    Comment: Performed at [facility], [place] [address], [place], [place] [address]  Blood Culture (routine x 2)     Status: None (Preliminary result)   Collection Time: 07/24/21 11:24 AM   Specimen: BLOOD  Result Value Ref Range Status   Specimen Description BLOOD BLOOD RIGHT WRIST  Final   Special Requests   Final    BOTTLES DRAWN AEROBIC ONLY Blood Culture adequate volume   Culture   Final    NO GROWTH 4 DAYS Performed at [facility], [place] [address] [place], [place] [address]    Report Status PENDING  Incomplete  Gastrointestinal Panel by PCR , Stool     Status: None   Collection Time: 07/27/21  3:19 PM   Specimen: Stool  Result Value Ref Range Status   Campylobacter species NOT DETECTED NOT DETECTED Final   Plesimonas shigelloides NOT DETECTED NOT DETECTED Final   Salmonella species NOT DETECTED NOT DETECTED Final   Yersinia enterocolitica NOT DETECTED NOT DETECTED Final   Vibrio species NOT DETECTED NOT DETECTED Final   Vibrio cholerae NOT DETECTED NOT DETECTED Final   Enteroaggregative E coli (EAEC) NOT DETECTED NOT DETECTED Final   Enteropathogenic E coli (EPEC) NOT DETECTED NOT DETECTED Final   Enterotoxigenic E coli (ETEC) NOT DETECTED NOT DETECTED Final   Shiga like toxin producing E coli (  STEC) NOT DETECTED NOT DETECTED Final   Shigella/Enteroinvasive E coli (EIEC) NOT DETECTED NOT DETECTED Final   Cryptosporidium NOT DETECTED NOT DETECTED Final   Cyclospora cayetanensis NOT DETECTED NOT DETECTED Final   Entamoeba histolytica NOT DETECTED NOT DETECTED Final   Giardia lamblia NOT DETECTED NOT DETECTED Final   Adenovirus F40/41 NOT DETECTED NOT DETECTED Final   Astrovirus NOT DETECTED NOT DETECTED Final   Norovirus GI/GII NOT DETECTED NOT DETECTED Final   Rotavirus A NOT DETECTED NOT DETECTED Final   Sapovirus (I, II, IV, and V) NOT  DETECTED NOT DETECTED Final    Comment: Performed at [facility], [address], [place], [place] [address]      Radiology Studies: No results found.     LOS: 3 days   Vernell Leep, MD,  FACP, [facility], [facility], [facility] (Care Management Physician Certified) [facility] [place]  To contact the attending provider between 7A-7P or the covering provider during after hours 7P-7A, please log into the web site [email or web address] and access using universal [place] password for that web site. If you do not have the password, please call the hospital operator.

## 2021-07-28 NOTE — Evaluation (Signed)
Clinical/Bedside Swallow Evaluation Patient Details  Name: Leon Estes MRN: [id number] Date of Birth: [birth year]  Today's Date: 07/28/2021 Time: SLP Start Time (ACUTE ONLY): 1128 SLP Stop Time (ACUTE ONLY): 1142 SLP Time Calculation (min) (ACUTE ONLY): 14 min  Past Medical History:  Past Medical History:  Diagnosis Date   Asthma    BPH (benign prostatic hypertrophy)    Cataract    Chronic renal disease, stage III (HCC)    Chronic sinusitis    COPD (chronic obstructive pulmonary disease) (HCC)    Coronary artery disease    2 stents   Gall stones    Hearing loss    DOES NOT WEAR HEARING AIDS   High cholesterol    Hypertension    Macular degeneration    right eye   Past Surgical History:  Past Surgical History:  Procedure Laterality Date   APPENDECTOMY     CORONARY ANGIOPLASTY WITH STENT PLACEMENT  1998   2 stents   TRANSURETHRAL RESECTION OF PROSTATE     HPI:  Pt is an 87 y.o. male with recent dx of Schwannoma presenting with AMS and somnolence. Clinical swallow evals in both May and September 2022 recommended Dys 2 diet/thin liquids due to cognitive impairment and edentulous status. PMH includes: BPH; stage 3 CKD; COPD; CAD; HTN; HLD; and recurrent C diff coliitis    Assessment / Plan / Recommendation  Clinical Impression  Pt's oropharyngeal swallow appears to be functional considering his lack of dentition. He does have mildly prologned mastication and some oral residue, but he shows good awareness by taking his time and using liquid washes PRN. He and his family describe his baseline diet, which appears to be most consistent with mechanical soft textures. Will therefore leave him on Dys 3 diet and thin liquids. No further SLP f/u indicated at this time but please reorder with any acute changes. SLP Visit Diagnosis: Dysphagia, unspecified (R13.10)    Aspiration Risk  Mild aspiration risk    Diet Recommendation Dysphagia 3 (Mech soft);Thin liquid   Liquid  Administration via: Cup;Straw Medication Administration: Whole meds with liquid Supervision: Patient able to self feed;Intermittent supervision to cue for compensatory strategies Compensations: Slow rate;Small sips/bites Postural Changes: Seated upright at 90 degrees    Other  Recommendations Oral Care Recommendations: Oral care BID    Recommendations for follow up therapy are one component of a multi-disciplinary discharge planning process, led by the attending physician.  Recommendations may be updated based on patient status, additional functional criteria and insurance authorization.  Follow up Recommendations No SLP follow up      Assistance Recommended at Discharge PRN  Functional Status Assessment Patient has not had a recent decline in their functional status  Frequency and Duration            Prognosis        Swallow Study   General HPI: Pt is an 87 y.o. male with recent dx of Schwannoma presenting with AMS and somnolence. Clinical swallow evals in both May and September 2022 recommended Dys 2 diet/thin liquids due to cognitive impairment and edentulous status. PMH includes: BPH; stage 3 CKD; COPD; CAD; HTN; HLD; and recurrent C diff coliitis Type of Study: Bedside Swallow Evaluation Previous Swallow Assessment: see HPI Diet Prior to this Study: Dysphagia 3 (soft);Thin liquids Temperature Spikes Noted: No Respiratory Status: Room air History of Recent Intubation: No Behavior/Cognition: Alert;Cooperative;Pleasant mood Oral Cavity Assessment: Within Functional Limits Oral Care Completed by SLP: No Oral Cavity - Dentition: Edentulous Vision:  Functional for self-feeding Patient Positioning: Upright in chair Baseline Vocal Quality: Normal Volitional Cough: Strong Volitional Swallow: Able to elicit    Oral/Motor/Sensory Function Overall Oral Motor/Sensory Function: Within functional limits   Ice Chips Ice chips: Not tested   Thin Liquid Thin Liquid: Within functional  limits Presentation: Cup;Self Fed;Straw    Nectar Thick Nectar Thick Liquid: Not tested   Honey Thick Honey Thick Liquid: Not tested   Puree Puree: Within functional limits Presentation: Spoon;Self Fed   Solid     Solid: Within functional limits (considering lack of dentition) Presentation: Self Fed      Osie Bond., M.A. [facility] Pager [phone] Office [phone]  07/28/2021,11:50 AM

## 2021-07-28 NOTE — Progress Notes (Signed)
Occupational Therapy Treatment Patient Details Name: Leon Estes MRN: [id number] DOB: 27-Dec-1930 Today's Date: 07/28/2021   History of present illness Leon Estes is a 87 y.o. male who presented with AMS and somnolence.  Dx:  acute metabolic encephalopathy PMH includes:  of BPH; stage 3 CKD; COPD; CAD; HTN; HLD; recent diagnosis of Schwannoma; and recurrent C diff coliitis   OT comments  Pt demonstrates improved cognition, but no family present to verify if he is back to baseline, or still with deficits.  He currently is able to complete ADLs at supervision - min guard assist level.  Recommend HHOT.    Recommendations for follow up therapy are one component of a multi-disciplinary discharge planning process, led by the attending physician.  Recommendations may be updated based on patient status, additional functional criteria and insurance authorization.    Follow Up Recommendations  Home health OT    Assistance Recommended at Discharge Frequent or constant Supervision/Assistance  Equipment Recommendations  None recommended by OT    Recommendations for Other Services      Precautions / Restrictions Precautions Precautions: Fall       Mobility Bed Mobility Overal bed mobility: Needs Assistance Bed Mobility: Supine to Sit;Sit to Supine     Supine to sit: Supervision Sit to supine: Supervision        Transfers Overall transfer level: Needs assistance   Transfers: Sit to/from Stand;Bed to chair/wheelchair/BSC Sit to Stand: Supervision Stand pivot transfers: Supervision Step pivot transfers: Min guard             Balance Overall balance assessment: Needs assistance   Sitting balance-Leahy Scale: Good     Standing balance support: No upper extremity supported Standing balance-Leahy Scale: Good                             ADL either performed or assessed with clinical judgement   ADL Overall ADL's : Needs  assistance/impaired Eating/Feeding: Independent;Sitting   Grooming: Wash/dry hands;Wash/dry face;Brushing hair;Supervision/safety;Standing   Upper Body Bathing: Set up;Sitting   Lower Body Bathing: Min guard;Sit to/from stand   Upper Body Dressing : Set up;Sitting   Lower Body Dressing: Min guard;Sit to/from stand   Toilet Transfer: Min guard;Ambulation;Comfort height toilet   Toileting- Clothing Manipulation and Hygiene: Supervision/safety;Sit to/from stand       Functional mobility during ADLs: Min guard;Rolling walker (2 wheels)      Extremity/Trunk Assessment Upper Extremity Assessment Upper Extremity Assessment: Generalized weakness   Lower Extremity Assessment Lower Extremity Assessment: Defer to PT evaluation        Vision   Vision Assessment?: No apparent visual deficits   Perception     Praxis      Cognition Arousal/Alertness: Awake/alert Behavior During Therapy: WFL for tasks assessed/performed Overall Cognitive Status: No family/caregiver present to determine baseline cognitive functioning                                 General Comments: Pt awake.  He was oriented to place and time.  He was able to follow 2 step commands.  No family present to determine if pt is back to baseline          Exercises     Shoulder Instructions       General Comments      Pertinent Vitals/ Pain       Pain Assessment: No/denies pain  Home Living                                          Prior Functioning/Environment              Frequency  Min 2X/week        Progress Toward Goals  OT Goals(current goals can now be found in the care plan section)  Progress towards OT goals: Progressing toward goals     Plan Discharge plan remains appropriate    Co-evaluation                 AM-PAC OT "6 Clicks" Daily Activity     Outcome Measure   Help from another person eating meals?: None Help from another person  taking care of personal grooming?: A Little Help from another person toileting, which includes using toliet, bedpan, or urinal?: A Little Help from another person bathing (including washing, rinsing, drying)?: A Little Help from another person to put on and taking off regular upper body clothing?: A Little Help from another person to put on and taking off regular lower body clothing?: A Little 6 Click Score: 19    End of Session    OT Visit Diagnosis: Unsteadiness on feet (R26.81);Other abnormalities of gait and mobility (R26.89);Muscle weakness (generalized) (M62.81);Pain   Activity Tolerance Patient tolerated treatment well   Patient Left in bed;with call bell/phone within reach;with bed alarm set   Nurse Communication Mobility status        Time: 6812-7517 OT Time Calculation (min): 13 min  Charges: OT General Charges $OT Visit: 1 Visit OT Treatments $Self Care/Home Management : 8-22 mins  Nilsa Nutting OTR/L Acute Rehabilitation Services Pager [phone] Office [phone]   Lucille Passy M 07/28/2021, 4:10 PM

## 2021-07-28 NOTE — TOC Progression Note (Signed)
Transition of Care [facility]) - Progression Note    Patient Details  Name: Leon Estes MRN: [id number] Date of Birth: June 22, 1931  [facility] CM/SW Contact  Pollie Friar, RN Phone Number: 07/28/2021, 11:59 AM  Clinical Narrative:    Alvis Lemmings arranged for home health services. Information on the AVS. Wife provided choice under [email or web address] TOC following for further d/c needs.    Expected Discharge Plan: [place] Barriers to Discharge: Continued Medical Work up  Expected Discharge Plan and Services Expected Discharge Plan: [place]   Discharge Planning Services: CM Consult Post Acute Care Choice: [place] arrangements for the past 2 months: Single Family Home                           HH Arranged: PT, OT, Nurse's Aide HH Agency: [place] Date [place]: 07/28/21   Representative spoke with at [place]: [place] ([place]) Interventions    Readmission Risk Interventions No flowsheet data found.

## 2021-07-29 LAB — CBC
HCT: 25.4 % — ABNORMAL LOW (ref 39.0–52.0)
Hemoglobin: 8 g/dL — ABNORMAL LOW (ref 13.0–17.0)
MCH: 28 pg (ref 26.0–34.0)
MCHC: 31.5 g/dL (ref 30.0–36.0)
MCV: 88.8 fL (ref 80.0–100.0)
Platelets: 305 10*3/uL (ref 150–400)
RBC: 2.86 MIL/uL — ABNORMAL LOW (ref 4.22–5.81)
RDW: 16.7 % — ABNORMAL HIGH (ref 11.5–15.5)
WBC: 6.8 10*3/uL (ref 4.0–10.5)
nRBC: 0 % (ref 0.0–0.2)

## 2021-07-29 LAB — MAGNESIUM: Magnesium: 1.8 mg/dL (ref 1.7–2.4)

## 2021-07-29 LAB — CULTURE, BLOOD (ROUTINE X 2)
Culture: NO GROWTH
Special Requests: ADEQUATE

## 2021-07-29 LAB — BASIC METABOLIC PANEL
Anion gap: 8 (ref 5–15)
BUN: 8 mg/dL (ref 8–23)
CO2: 21 mmol/L — ABNORMAL LOW (ref 22–32)
Calcium: 8.7 mg/dL — ABNORMAL LOW (ref 8.9–10.3)
Chloride: 111 mmol/L (ref 98–111)
Creatinine, Ser: 1 mg/dL (ref 0.61–1.24)
GFR, Estimated: >60 mL/min (ref >60)
Glucose, Bld: 86 mg/dL (ref 70–99)
Potassium: 3.9 mmol/L (ref 3.5–5.1)
Sodium: 140 mmol/L (ref 135–145)

## 2021-07-29 LAB — VITAMIN B1: Vitamin B1 (Thiamine): 171.7 nmol/L (ref 66.5–200.0)

## 2021-07-29 LAB — GLUCOSE, CAPILLARY: Glucose-Capillary: 84 mg/dL (ref 70–99)

## 2021-07-29 MED ORDER — QUETIAPINE FUMARATE 25 MG PO TABS: 25.0000 mg | ORAL_TABLET | Freq: Every evening | ORAL | 0 refills | Status: DC | PRN

## 2021-07-29 NOTE — Progress Notes (Signed)
Discharge instructions discussed with patient and wife. All questions answered. Iv's removed. No telemetry to d/c. Patient wheeled off unit for transport home by son and wife.  Gwendolyn Grant, RN

## 2021-07-29 NOTE — Discharge Summary (Signed)
Physician Discharge Summary  ANAIS KOENEN ESP:[id number] DOB: 06/21/1931  PCP: Venia Carbon, MD  Admitted from: Home Discharged to: Home  Admit date: 07/24/2021 Discharge date: 07/29/2021  Recommendations for Outpatient Follow-up:    Follow-up Information     [facility] Follow up.   Specialty: Home Health Services Why: The home health agency will contact you for the first home visit Contact information: [place] [address] [phone]         Venia Carbon, MD. Schedule an appointment as soon as possible for a visit in 1 week(s).   Specialties: Internal Medicine, Pediatrics Why: To be seen with repeat labs (CBC, BMP) and EKG. Contact information: [place] [address] [phone]         Nahser, Wonda Cheng, MD .   Specialty: Cardiology Contact information: [address] [place] [address] [phone]                  Home Health:  [facility] Orders (From admission, onward)     Start     Ordered   07/27/21 [place]  At discharge       Question Answer Comment  To provide the following care/treatments PT   To provide the following care/treatments OT   To provide the following care/treatments [place]      07/27/21 1211             Equipment/Devices: None    Discharge Condition: Improved and stable   Code Status: DNR Diet recommendation:  Discharge Diet Orders (From admission, onward)     Start     Ordered   07/29/21 0000  Diet - low sodium heart healthy        07/29/21 1340             Discharge Diagnoses:  Principal Problem:   Acute metabolic encephalopathy Active Problems:   Hyperlipidemia   COPD (chronic obstructive pulmonary disease) ([place])   Recurrent Clostridioides difficile diarrhea   Neoplasm of brain causing mass effect on adjacent structures ([place])   AKI (acute kidney injury) ([place])   Brief  Summary: Leon Estes is a 87 y.o. male with medical history significant of BPH; stage 3 CKD; COPD; CAD; HTN; HLD; recent diagnosis of Schwannoma; and recurrent C diff coliitis presenting with AMS, somnolence.  Apparently was quite agitated and delirious 2 days prior to admission.  Apparently had terrible diarrhea along with fever and chills last week for which he was prescribed vancomycin for presumed C. difficile by his PCP.  Apparently continue to have diarrhea despite taking vancomycin.  Hospitalized for further management.    Assessment/Plan:   Acute metabolic encephalopathy - As per discussion with spouse, no formal diagnosis of dementia and at baseline patient quite coherent.  Review of previous admission in September 2022 shows similar presentation at which time extensive work-up including MRI brain, TSH, UA, ammonia, B12 were unremarkable.  Neurology had consulted, concerns for possible underlying dementia and and recommended outpatient neuropsychiatric testing.  Had been on Seroquel 25 mg at bedtime which seemed to have helped but was not on his med list on this admission. - Patient was quite delirious prior to admission, even hallucinating.    - CT head done in the ED did not show any acute findings.   - Acute metabolic encephalopathy likely multifactorial due to acute diarrheal illness, AKI, metabolic acidosis, multiple severe electrolyte  abnormalities, sleep deprivation, complicating hard of hearing and visual impairment and patient may have underlying undiagnosed dementia. - B12: 1344.  RPR non reactive.  Patient received 2 doses of high dose IV thiamine 500 mg pending thiamine results which have come back as normal (171.7).   - Initiated Seroquel 25 Mg nightly after confirming QTC of 476 ms (most likely lower given QRS greater than 120 and hence inaccurate) on 12/15.  Delirium precautions.  Clinically significantly improved and mental status back to baseline. -UDS and blood alcohol  level negative. -Low index of suspicion for mental status changes from seizure or postictal status. -As discussed with patient's daughter on day of discharge, patient will discharge on limited supply of Seroquel 25 Mg nightly only as needed for confusion and agitation.  Hopefully he should not need it beyond that given history of prior quick recovery from previous hospital discharge.  I did review patient's EKG from day of discharge with the cardiologist on-call.  Although the QTC is documented as 494 ms, patient has RBBB morphology with QRS of 140.  Cardiologist indicated that QTC calculation in the context of prolonged QRS would be inaccurate and hence JTC would be appropriate interval to measure in such patients and that is okay here. -Close outpatient follow-up with PCP and may consider outpatient neuropsychiatric consultation for further evaluation.   Acute diarrhea with abdominal pain/history of recurrent C. difficile Repeat abdominal CT 12/13 showed somewhat patulous colon and fluid-filled to the rectum, consistent with diarrheal illness, however not overtly distended and without acute inflammatory findings.  Bladder was unremarkable. He remains on a long vancomycin taper.  As per spouse, he completed an 18-day course of Dificid. Etiology of diarrhea is unclear.  Repeat stool C. difficile testing negative and GI panel PCR: Negative.?  C. difficile related IBS versus alternative causes of diarrhea.   Gram variable rods in both aerobic and anaerobic bottles,?  Contaminant-discussed with ID pharmacist who agrees that this is likely a skin contaminant. Diarrhea has resolved Unsure as to why patient is on a PPI.  During outpatient follow-up with PCP, consider stopping PPI if there is no clear an absolute indication to continue same.   Acute kidney injury/metabolic acidosis AKI likely secondary to prerenal from GI losses and poor oral intake.  Resolved after IV fluids.  Avoid nephrotoxins    Hypokalemia Replaced aggressively and normal on day of discharge.   Hypomagnesemia Replaced aggressively and normal on day of discharge.   Leukocytosis Chest x-ray was clear.  UA was also clear and urine culture negative.  Blood cultures x2: Negative to date.  CT abdomen without acute findings.  May be stress response or due to acute diarrheal illness.  Resolved.   Normocytic anemia Hemoglobin has dropped from baseline of 10 to 8.6 and has gradually drifted down to 8.  No overt bleeding reported.  Some of this could be dilutional.   Anemia panel: Iron 24, TIBC 279, saturation ratio 9, ferritin 44, folate 15.4 and B12: 1344. Anemia panel suggests an element of iron deficiency which can be evaluated as outpatient as deemed necessary and also can start oral iron supplements during outpatient follow-up.  Did not start iron supplements in the hospital due to recent acute GI issues. Follow CBC closely as outpatient.   Hypoglycemia Resolved.   History of COPD Stable.  Continue inhalers.  Schwannoma To follow-up with neurosurgery in the outpatient setting.  Hyperlipidemia Continue simvastatin  History of BPH Continue Flomax.  Bladder unremarkable on CT abdomen.  Consultations: None  Procedures: None   Discharge Instructions  Discharge Instructions     Call MD for:   Complete by: As directed    Recurrent confusion, altered mental status or diarrhea.   Call MD for:  difficulty breathing, headache or visual disturbances   Complete by: As directed    Call MD for:  extreme fatigue   Complete by: As directed    Call MD for:  persistant dizziness or light-headedness   Complete by: As directed    Call MD for:  persistant nausea and vomiting   Complete by: As directed    Call MD for:  severe uncontrolled pain   Complete by: As directed    Call MD for:  temperature >100.4   Complete by: As directed    Diet - low sodium heart healthy   Complete by: As directed     Discharge instructions   Complete by: As directed    Diet consistency as per speech therapy recommendations as follows:  Dysphagia 3 (Mech soft);Thin liquid    Liquid Administration via: Cup;Straw Medication Administration: Whole meds with liquid Supervision: Patient able to self feed;Intermittent supervision to cue for compensatory strategies Compensations: Slow rate;Small sips/bites Postural Changes: Seated upright at 90 degrees   Increase activity slowly   Complete by: As directed         Medication List     TAKE these medications    acetaminophen 500 MG tablet Commonly known as: TYLENOL Take 1,000 mg by mouth every 6 (six) hours as needed for moderate pain or headache.   aspirin EC 81 MG tablet Take 81 mg by mouth daily.   budesonide-formoterol 160-4.5 MCG/ACT inhaler Commonly known as: SYMBICORT INHALE 2 PUFFS INTO THE LUNGS TWICE DAILY What changed: when to take this   gabapentin 300 MG capsule Commonly known as: NEURONTIN TAKE 1 CAPSULE BY MOUTH ONCE DAILY What changed: when to take this   multivitamin with minerals Tabs tablet Take 1 tablet by mouth daily.   One-A-Day Mens 50+ Tabs Take 1 tablet by mouth daily.   pantoprazole 40 MG tablet Commonly known as: PROTONIX Take 1 tablet (40 mg total) by mouth at bedtime. Or morning on empty stomach What changed:  when to take this additional instructions   potassium chloride 10 MEQ tablet Commonly known as: KLOR-CON Take 1 tablet (10 mEq total) by mouth daily.   PROBIOTIC DAILY PO Take 1 capsule by mouth daily.   QUEtiapine 25 MG tablet Commonly known as: SEROQUEL Take 1 tablet (25 mg total) by mouth at bedtime as needed (Confusion or agitation.).   simvastatin 40 MG tablet Commonly known as: ZOCOR Take 40 mg by mouth every evening.   tamsulosin 0.4 MG Caps capsule Commonly known as: FLOMAX TAKE 1 CAPSULE BY MOUTH ONCE DAILY What changed: when to take this   traMADol 50 MG tablet Commonly  known as: ULTRAM TAKE 1 TABLET BY MOUTH ONCE A DAY AS NEEDED FOR PAIN What changed: See the new instructions.   vancomycin 125 MG capsule Commonly known as: VANCOCIN Take 1 capsule (125 mg total) by mouth 4 (four) times daily. For 2 weeks. Then twice a day for 1 week, then daily for 1 week. Finally, continue it every other day for 8 weeks What changed:  when to take this additional instructions       Allergies  Allergen Reactions   Sulfa Antibiotics Rash   Benzalkonium Chloride     "Benzalkonium chloride is a quaternary ammonium antiseptic and  disinfectant with actions and uses similar to those of other cationic surfactants. It is also used as an antimicrobial preservative for pharmaceutical products."   Neosporin [Neomycin-Bacitracin Zn-Polymyx]    Augmentin [Amoxicillin-Pot Clavulanate] Rash   Terbinafine Rash   Terbinafine And Related Rash      Procedures/Studies: CT ABDOMEN PELVIS WO CONTRAST  Result Date: 07/25/2021 CLINICAL DATA:  Abdominal pain, recurrent C difficile infection EXAM: CT ABDOMEN AND PELVIS WITHOUT CONTRAST TECHNIQUE: Multidetector CT imaging of the abdomen and pelvis was performed following the standard protocol without IV contrast. COMPARISON:  01/02/2021 FINDINGS: Lower chest: No acute abnormality. Coronary artery calcifications. Scarring and or atelectasis of the dependent right lung base. Hepatobiliary: No focal liver abnormality is seen. Status post cholecystectomy. No biliary dilatation. Pancreas: Unremarkable. No pancreatic ductal dilatation or surrounding inflammatory changes. Spleen: Normal in size without significant abnormality. Adrenals/Urinary Tract: Adrenal glands are unremarkable. Punctuate nonobstructive calculus of the superior pole of the right kidney. Bladder is unremarkable. Stomach/Bowel: Stomach is within normal limits. Appendix is not clearly visualized and may be surgically absent. Colon is somewhat patulous and fluid-filled to the rectum,  however not overtly distended, largest loops measuring 6.5 cm. No acute inflammatory findings. Vascular/Lymphatic: Aortic atherosclerosis. No enlarged abdominal or pelvic lymph nodes. Reproductive: Prostatomegaly. Other: No abdominal wall hernia or abnormality. No abdominopelvic ascites. Musculoskeletal: No acute or significant osseous findings. Chronic bilateral pars defects of L5 with anterolisthesis of L5 on S1. IMPRESSION: 1. Colon is somewhat patulous and fluid-filled to the rectum, consistent with diarrheal illness, however not overtly distended and without acute inflammatory findings. 2. Nonobstructive right nephrolithiasis.  No hydronephrosis. 3. Prostatomegaly. 4. Coronary artery disease. Aortic Atherosclerosis (ICD10-I70.0). Electronically Signed   By: Delanna Ahmadi M.D.   On: 07/25/2021 12:47   CT HEAD WO CONTRAST  Result Date: 07/24/2021 CLINICAL DATA:  Mental status change, unknown cause. EXAM: CT HEAD WITHOUT CONTRAST TECHNIQUE: Contiguous axial images were obtained from the base of the skull through the vertex without intravenous contrast. COMPARISON:  04/18/2021. FINDINGS: Brain: No acute intracranial hemorrhage, midline shift or mass effect. No extra-axial fluid collection. Diffuse atrophy is noted. Subcortical and periventricular white matter hypodensities are present bilaterally. There is no hydrocephalus. Vascular: No hyperdense vessel or unexpected calcification. Skull: No definite fracture. Sinuses/Orbits: There is mucosal thickening in the left maxillary sinus. Partial opacification of the ethmoid sinuses bilaterally. No acute orbital abnormality. Other: Examination is limited due to motion artifact. IMPRESSION: 1. No acute intracranial hemorrhage. 2. Atrophy with chronic microvascular ischemic changes. 3. 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 Electronically Signed   By: Brett Fairy M.D.   On: 07/24/2021 04:49   DG Chest Port 1  View  Result Date: 07/24/2021 CLINICAL DATA:  Possible sepsis.  Altered mental status. EXAM: PORTABLE CHEST 1 VIEW COMPARISON:  04/29/2021. FINDINGS: The heart mildly enlarged and pulmonary vascular congestion is noted. There is mild atherosclerotic calcification of the aorta. No consolidation, effusion, or pneumothorax. No acute osseous abnormality. IMPRESSION: Cardiomegaly with mildly distended pulmonary vasculature. Electronically Signed   By: Brett Fairy M.D.   On: 07/24/2021 03:36      Subjective: "I was told that I was going home today".  Anxious to DC.  Denies complaints.  No diarrhea.  1 BM recorded in the last 24 hours.  Tolerating diet.  Extremely hard of hearing.  Confusion resolved.  Discharge Exam:  Vitals:   07/29/21 0312 07/29/21 0748 07/29/21 0803 07/29/21 1127  BP: (!) 109/58  127/61  (!) 143/70  Pulse: 79 73 80 84  Resp:  18 16 20   Temp: 98.6 F (37 C) 98.8 F (37.1 C)  98.6 F (37 C)  TempSrc: Oral Oral  Oral  SpO2: 94% 100% 95% 99%  Weight:      Height:        General exam: Elderly male, moderately built and frail, sitting up comfortably in bed working on his lunch earlier today. Respiratory system: Clear to auscultation.  No increased work of breathing. Cardiovascular system: S1 and S2 heard, RRR.  No JVD, murmurs or pedal edema.   Gastrointestinal system: Abdomen is nondistended, soft and no tenderness today.  Normal bowel sounds heard. Central nervous system: Alert and oriented to person, place, partly to time.  Answers questions appropriately.  No focal neurological deficits. Extremities: Symmetric 5 x 5 power. Skin: No rashes, lesions or ulcers Psychiatry: Judgement and insight impaired but improved compared to prior. Mood & affect appropriate.     The results of significant diagnostics from this hospitalization (including imaging, microbiology, ancillary and laboratory) are listed below for reference.     Microbiology: Recent Results (from the past  240 hour(s))  Blood Culture (routine x 2)     Status: Abnormal   Collection Time: 07/24/21  3:24 AM   Specimen: BLOOD  Result Value Ref Range Status   Specimen Description BLOOD RIGHT ANTECUBITAL  Final   Special Requests   Final    BOTTLES DRAWN AEROBIC AND ANAEROBIC Blood Culture adequate volume   Culture  Setup Time (A)  Final    GRAM VARIABLE ROD IN BOTH AEROBIC AND ANAEROBIC BOTTLES CRITICAL RESULT CALLED TO, READ BACK BY AND VERIFIED WITH: PHARMD J Iran [id number] AT 5 BY CM    Culture   Final    GRAM POSITIVE RODS CALL IF FURTHER WORK UP NEEDED Performed at [facility], [place] [address] [place], [place] [address]    Report Status 07/26/2021 FINAL  Final  Blood Culture ID Panel (Reflexed)     Status: None   Collection Time: 07/24/21  3:24 AM  Result Value Ref Range Status   Enterococcus faecalis NOT DETECTED NOT DETECTED Final   Enterococcus Faecium NOT DETECTED NOT DETECTED Final   Listeria monocytogenes NOT DETECTED NOT DETECTED Final   Staphylococcus species NOT DETECTED NOT DETECTED Final   Staphylococcus aureus (BCID) NOT DETECTED NOT DETECTED Final   Staphylococcus epidermidis NOT DETECTED NOT DETECTED Final   Staphylococcus lugdunensis NOT DETECTED NOT DETECTED Final   Streptococcus species NOT DETECTED NOT DETECTED Final   Streptococcus agalactiae NOT DETECTED NOT DETECTED Final   Streptococcus pneumoniae NOT DETECTED NOT DETECTED Final   Streptococcus pyogenes NOT DETECTED NOT DETECTED Final   A.calcoaceticus-baumannii NOT DETECTED NOT DETECTED Final   Bacteroides fragilis NOT DETECTED NOT DETECTED Final   Enterobacterales NOT DETECTED NOT DETECTED Final   Enterobacter cloacae complex NOT DETECTED NOT DETECTED Final   Escherichia coli NOT DETECTED NOT DETECTED Final   Klebsiella aerogenes NOT DETECTED NOT DETECTED Final   Klebsiella oxytoca NOT DETECTED NOT DETECTED Final   Klebsiella pneumoniae NOT DETECTED NOT DETECTED Final   Proteus species NOT  DETECTED NOT DETECTED Final   Salmonella species NOT DETECTED NOT DETECTED Final   Serratia marcescens NOT DETECTED NOT DETECTED Final   Haemophilus influenzae NOT DETECTED NOT DETECTED Final   Neisseria meningitidis NOT DETECTED NOT DETECTED Final   Pseudomonas aeruginosa NOT DETECTED NOT DETECTED Final   Stenotrophomonas maltophilia NOT DETECTED NOT DETECTED  Final   Candida albicans NOT DETECTED NOT DETECTED Final   Candida auris NOT DETECTED NOT DETECTED Final   Candida glabrata NOT DETECTED NOT DETECTED Final   Candida krusei NOT DETECTED NOT DETECTED Final   Candida parapsilosis NOT DETECTED NOT DETECTED Final   Candida tropicalis NOT DETECTED NOT DETECTED Final   Cryptococcus neoformans/gattii NOT DETECTED NOT DETECTED Final    Comment: Performed at [facility], [place] [address], [place], [place] [address]  Resp Panel by RT-PCR (Flu A&B, Covid) Nasopharyngeal Swab     Status: None   Collection Time: 07/24/21  5:10 AM   Specimen: Nasopharyngeal Swab; Nasopharyngeal(NP) swabs in vial transport medium  Result Value Ref Range Status   SARS Coronavirus 2 by RT PCR NEGATIVE NEGATIVE Final    Comment: (NOTE) SARS-CoV-2 target nucleic acids are NOT DETECTED.  The SARS-CoV-2 RNA is generally detectable in upper respiratory specimens during the acute phase of infection. The lowest concentration of SARS-CoV-2 viral copies this assay can detect is 138 copies/mL. A negative result does not preclude SARS-Cov-2 infection and should not be used as the sole basis for treatment or other patient management decisions. A negative result may occur with  improper specimen collection/handling, submission of specimen other than nasopharyngeal swab, presence of viral mutation(s) within the areas targeted by this assay, and inadequate number of viral copies(<138 copies/mL). A negative result must be combined with clinical observations, patient history, and epidemiological information. The expected  result is Negative.  Fact Sheet for Patients:  EntrepreneurPulse.com.au  Fact Sheet for Healthcare Providers:  IncredibleEmployment.be  This test is no t yet approved or cleared by the [place] FDA and  has been authorized for detection and/or diagnosis of SARS-CoV-2 by FDA under an Emergency Use Authorization (EUA). This EUA will remain  in effect (meaning this test can be used) for the duration of the COVID-19 declaration under Section 564(b)(1) of the Act, 21 U.S.C.section 360bbb-3(b)(1), unless the authorization is terminated  or revoked sooner.       Influenza A by PCR NEGATIVE NEGATIVE Final   Influenza B by PCR NEGATIVE NEGATIVE Final    Comment: (NOTE) The Xpert Xpress SARS-CoV-2/FLU/RSV plus assay is intended as an aid in the diagnosis of influenza from Nasopharyngeal swab specimens and should not be used as a sole basis for treatment. Nasal washings and aspirates are unacceptable for Xpert Xpress SARS-CoV-2/FLU/RSV testing.  Fact Sheet for Patients: EntrepreneurPulse.com.au  Fact Sheet for Healthcare Providers: IncredibleEmployment.be  This test is not yet approved or cleared by the [place] FDA and has been authorized for detection and/or diagnosis of SARS-CoV-2 by FDA under an Emergency Use Authorization (EUA). This EUA will remain in effect (meaning this test can be used) for the duration of the COVID-19 declaration under Section 564(b)(1) of the Act, 21 U.S.C. section 360bbb-3(b)(1), unless the authorization is terminated or revoked.  Performed at [facility], [place] [address] [place], [place] [address]   Urine Culture     Status: None   Collection Time: 07/24/21  5:42 AM   Specimen: In/Out Cath Urine  Result Value Ref Range Status   Specimen Description IN/OUT CATH URINE  Final   Special Requests NONE  Final   Culture   Final    NO GROWTH Performed at [facility], [place] [address] [place],  [address]    Report Status 07/25/2021 FINAL  Final  C Difficile Quick Screen w PCR reflex     Status: None  Collection Time: 07/24/21 10:46 AM   Specimen: STOOL  Result Value Ref Range Status   C Diff antigen NEGATIVE NEGATIVE Final   C Diff toxin NEGATIVE NEGATIVE Final   C Diff interpretation No C. difficile detected.  Final    Comment: Performed at [facility], [place] [address], [place], [place] [address]  Blood Culture (routine x 2)     Status: None (Preliminary result)   Collection Time: 07/24/21 11:24 AM   Specimen: BLOOD  Result Value Ref Range Status   Specimen Description BLOOD BLOOD RIGHT WRIST  Final   Special Requests   Final    BOTTLES DRAWN AEROBIC ONLY Blood Culture adequate volume   Culture   Final    NO GROWTH 4 DAYS Performed at [facility], [place] [address] [place], [place] [address]    Report Status PENDING  Incomplete  Gastrointestinal Panel by PCR , Stool     Status: None   Collection Time: 07/27/21  3:19 PM   Specimen: Stool  Result Value Ref Range Status   Campylobacter species NOT DETECTED NOT DETECTED Final   Plesimonas shigelloides NOT DETECTED NOT DETECTED Final   Salmonella species NOT DETECTED NOT DETECTED Final   Yersinia enterocolitica NOT DETECTED NOT DETECTED Final   Vibrio species NOT DETECTED NOT DETECTED Final   Vibrio cholerae NOT DETECTED NOT DETECTED Final   Enteroaggregative E coli (EAEC) NOT DETECTED NOT DETECTED Final   Enteropathogenic E coli (EPEC) NOT DETECTED NOT DETECTED Final   Enterotoxigenic E coli (ETEC) NOT DETECTED NOT DETECTED Final   Shiga like toxin producing E coli (STEC) NOT DETECTED NOT DETECTED Final   Shigella/Enteroinvasive E coli (EIEC) NOT DETECTED NOT DETECTED Final   Cryptosporidium NOT DETECTED NOT DETECTED Final   Cyclospora cayetanensis NOT DETECTED NOT DETECTED Final   Entamoeba histolytica NOT DETECTED NOT DETECTED Final   Giardia lamblia NOT DETECTED  NOT DETECTED Final   Adenovirus F40/41 NOT DETECTED NOT DETECTED Final   Astrovirus NOT DETECTED NOT DETECTED Final   Norovirus GI/GII NOT DETECTED NOT DETECTED Final   Rotavirus A NOT DETECTED NOT DETECTED Final   Sapovirus (I, II, IV, and V) NOT DETECTED NOT DETECTED Final    Comment: Performed at [facility], [address] [place], [place] [address]     Labs: CBC: Recent Labs  Lab 07/24/21 0324 07/24/21 0346 07/25/21 7846 07/26/21 0333 07/26/21 1455 07/27/21 0220 07/29/21 0701  WBC 10.2  --  20.2* 11.3* 11.7* 10.9* 6.8  NEUTROABS 4.8  --   --  7.5  --   --   --   HGB 10.9*   < > 10.1* 8.6* 8.9* 8.2* 8.0*  HCT 34.6*   < > 31.5* 26.4* 27.3* 24.9* 25.4*  MCV 88.9  --  88.2 86.3 86.1 86.5 88.8  PLT 287  --  317 300 342 300 305   < > = values in this interval not displayed.    Basic Metabolic Panel: Recent Labs  Lab 07/26/21 0333 07/26/21 1455 07/27/21 0220 07/28/21 0242 07/29/21 0701  NA 136 136 137 137 140  K 2.8* 3.1* 3.7 3.3* 3.9  CL 108 110 111 110 111  CO2 22 20* 19* 21* 21*  GLUCOSE 114* 129* 118* 98 86  BUN 11 11 9 10 8   CREATININE 1.07 1.11 1.05 1.12 1.00  CALCIUM 8.2* 8.5* 8.2* 8.5* 8.7*  MG 1.5* 2.2 1.9 1.8 1.8    Liver Function Tests: Recent Labs  Lab 07/24/21  0324  AST 30  ALT 23  ALKPHOS 73  BILITOT 0.6  PROT 6.6  ALBUMIN 3.2*    CBG: Recent Labs  Lab 07/26/21 1625 07/27/21 0703 07/28/21 1638 07/28/21 2325 07/29/21 0751  GLUCAP 105* 96 85 107* 84      Urinalysis    Component Value Date/Time   COLORURINE YELLOW 07/24/2021 0542   APPEARANCEUR CLEAR 07/24/2021 0542   APPEARANCEUR Clear 04/29/2013 1543   LABSPEC 1.027 07/24/2021 0542   LABSPEC 1.020 04/29/2013 1543   PHURINE 5.0 07/24/2021 0542   GLUCOSEU NEGATIVE 07/24/2021 0542   GLUCOSEU Negative 04/29/2013 1543   HGBUR SMALL (A) 07/24/2021 0542   BILIRUBINUR NEGATIVE 07/24/2021 0542   BILIRUBINUR + 07/25/2018 1304   BILIRUBINUR Negative 04/29/2013 1543    KETONESUR NEGATIVE 07/24/2021 0542   PROTEINUR 30 (A) 07/24/2021 0542   UROBILINOGEN 0.2 07/25/2018 1304   UROBILINOGEN 0.2 03/08/2012 1624   NITRITE NEGATIVE 07/24/2021 0542   LEUKOCYTESUR NEGATIVE 07/24/2021 0542   LEUKOCYTESUR Negative 04/29/2013 1543    Unable to reach patient's spouse via her landline or cell phone.  I was able to discuss with their eldest daughter via phone, updated care and answered all questions.    Time coordinating discharge: 35 minutes  SIGNED:  Vernell Leep, MD,  FACP, [facility], [facility], [facility] (Care Management Physician Certified). [facility]  To contact the attending provider between 7A-7P or the covering provider during after hours 7P-7A, please log into the web site [email or web address] and access using universal [place] password for that web site. If you do not have the password, please call the hospital operator.

## 2021-07-29 NOTE — Discharge Instructions (Signed)

## 2021-08-01 ENCOUNTER — Telehealth: Payer: Self-pay

## 2021-08-01 DIAGNOSIS — J449 Chronic obstructive pulmonary disease, unspecified: Secondary | ICD-10-CM | POA: Diagnosis not present

## 2021-08-01 DIAGNOSIS — Z8619 Personal history of other infectious and parasitic diseases: Secondary | ICD-10-CM | POA: Diagnosis not present

## 2021-08-01 DIAGNOSIS — D3610 Benign neoplasm of peripheral nerves and autonomic nervous system, unspecified: Secondary | ICD-10-CM | POA: Diagnosis not present

## 2021-08-01 NOTE — Telephone Encounter (Signed)
Transition Care Management Follow-up Telephone Call Date of discharge and from where: 07/29/21 from [facility] How have you been since you were released from the hospital? Patient states feeling better. Any questions or concerns? No  Items Reviewed: Did the pt receive and understand the discharge instructions provided? Yes , patient states wife understands instructions Medications obtained and verified? Yes  Other? No  Any new allergies since your discharge? No  Dietary orders reviewed? Yes Do you have support at home? Yes   Home Care and Equipment/Supplies: Were home health services ordered? yes If so, what is the name of the agency? [facility]  Has the agency set up a time to come to the patient's home? yes Were any new equipment or medical supplies ordered?  No What is the name of the medical supply agency? N/A Were you able to get the supplies/equipment? not applicable Do you have any questions related to the use of the equipment or supplies? No  Functional Questionnaire: (I = Independent and D = Dependent) ADLs: I  Bathing/Dressing- I  Meal Prep- I with assistance from wife  Eating- I  Maintaining continence- I  Transferring/Ambulation- I with assistance of walker  Managing Meds- I with assistance from wife  Follow up appointments reviewed:  PCP Hospital f/u appt confirmed? No  Patient states that wife will call to make an appointment. [facility] f/u appt confirmed? No  Patient states wife was making appointment with the cardiologist. Are transportation arrangements needed? No  If their condition worsens, is the pt aware to call PCP or go to the Emergency Dept.? Yes Was the patient provided with contact information for the PCP's office or ED? Yes Was to pt encouraged to call back with questions or concerns? Yes

## 2021-08-07 ENCOUNTER — Other Ambulatory Visit: Payer: Self-pay | Admitting: Internal Medicine

## 2021-08-08 ENCOUNTER — Inpatient Hospital Stay: Payer: PPO | Admitting: Family

## 2021-08-08 ENCOUNTER — Telehealth: Payer: Self-pay

## 2021-08-08 NOTE — Telephone Encounter (Signed)
[facility] Night - Client Nonclinical Telephone Record  [facility] Client [facility] Night - Client Client Site [place] - Night Provider Viviana Simpler- MD Contact Type Call Who Is Calling Patient / Member / Family / Caregiver Caller Name [facility] Phone Number [phone] Patient Name Leon Estes Patient DOB 26-Mar-1931 Call Type Message Only Information Provided Reason for Call Request to [facility] Appointment Initial Comment Caller states needs to cancel her husbands appointment. Patient request to speak to RN No Additional Comment Declined triage. Disp. Time Disposition Final User 08/07/2021 11:24:08 AM General Information Provided Yes Leon Estes, Leon Estes Call Closed By: Tula Nakayama Buccino Transaction Date/Time: 08/07/2021 11:21:20 AM (ET

## 2021-08-10 ENCOUNTER — Telehealth: Payer: Self-pay

## 2021-08-10 NOTE — Telephone Encounter (Signed)
I spoke with pt's wife; pt does not have fever,pain is still rt upper abd pain under rt ribs but pain level greatly reduced. Pain level now is 2. If pain worsens or pt develops temp Mrs Hacker will take pt to ED. Mrs Sarli will cb on 08/11/21 with update. Sending note to Dutch Quint FNP who is covering for Dr  Silvio Pate who is out of office and [facility] CMA.

## 2021-08-10 NOTE — Telephone Encounter (Signed)
[facility] Day - Client TELEPHONE ADVICE RECORD [facility] Patient Name: Leon Estes MMERS Gender: Male DOB: February 04, 1931 Age: 87 Y 13 M 22 D Return Phone Number: [phone] (Primary) Address: City/ State/ Zip: [place]  [address] Client  Primary [facility] Day - Client Client Site [facility] - Day Provider Viviana Simpler- MD Contact Type Call Who Is Calling Patient / Member / Family / Caregiver Call Type Triage / Clinical Caller Name Zyron Deeley Relationship To Patient Spouse Return Phone Number [phone] (Primary) Chief Complaint Abdominal Pain Reason for Call Symptomatic / Request for [name] states that she is transferring a wife of a patient who is experiencing gallbladder issues that started this morning. He is having lower, right abdominal pain. She said they do not have any appointments available today. Translation No Nurse Assessment Nurse: Romona Curls, RN, Denyse Amass Date/Time Eilene Ghazi Time): 08/10/2021 1:50:51 PM Confirm and document reason for call. If symptomatic, describe symptoms. ---Caller states started this morning gallbladder issues that started this morning. He is having lower, right abdominal pain 9/10. She said they do not have any appointments available today. Caller states back in April had similar symptoms. Does the patient have any new or worsening symptoms? ---Yes Will a triage be completed? ---Yes Related visit to physician within the last 2 weeks? ---No Does the PT have any chronic conditions? (i.e. diabetes, asthma, this includes High risk factors for pregnancy, etc.) ---Yes List chronic conditions. ---Caller states hospital x4 this year. COPD, stent x3 in 98. Is this a behavioral health or substance abuse call? ---No Guidelines Guideline Title Affirmed Question Affirmed Notes Nurse Date/Time Eilene Ghazi Time) Abdominal Pain - Upper [1]  SEVERE pain (e.g., excruciating) AND [2] present > 1 hour [name] 08/10/2021 1:53:20 PM PLEASE NOTE: All timestamps contained within this report are represented as [place] Standard Time. CONFIDENTIALTY NOTICE: This fax transmission is intended only for the addressee. It contains information that is legally privileged, confidential or otherwise protected from use or disclosure. If you are not the intended recipient, you are strictly prohibited from reviewing, disclosing, copying using or disseminating any of this information or taking any action in reliance on or regarding this information. If you have received this fax in error, please notify us immediately by telephone so that we can arrange for its return to [place]. Phone: [phone], Toll-Free: [phone], Fax: [phone] Page: 2 of 2 Call Id: [id number] [place]. Time Eilene Ghazi Time) Disposition Final User 08/10/2021 1:55:14 PM Go to ED Now Yes Romona Curls, RN, Nadara Eaton Disagree/Comply Comply Caller Understands Yes PreDisposition Call Doctor Care Advice Given Per Guideline * Go to the ED at ___________ [place] [facility] NOW: * Leave now. Drive carefully. NOTE TO TRIAGER - DRIVING: * Another adult should drive. BRING MEDICINES: NOTHING BY MOUTH: * Reason: Condition may need surgery and general anesthesia. * Do not eat or drink anything for now. CARE ADVICE given per Abdominal Pain, Upper (Adult) guideline. Referrals [place]

## 2021-08-11 NOTE — Telephone Encounter (Signed)
Dutch Quint B, FNP to Me     11:25 AM  Please be seen at [facility] today. Thank you!        I spoke with Mrs Bartell and she said they will go to an UC today; Mrs Sautter is not sure which one yet because she is going to try to find the UC with less wait time but Mrs Bekker did verify they pt will be seen at an UC today. Sending note to Dutch Quint FNP.

## 2021-08-11 NOTE — Telephone Encounter (Signed)
I spoke with pt's wife; pt did OK last night but pt is having pain again this morning. Mrs Greis is going to talk with her children and see about taking pt to an UC in [place]. Pt wants to stay away from ED and not sure which UC they will go to but Mrs Sullenger is going to call children shortly to decide what to do. UC & ED precautions given to pts wife and she voiced understanding.Sending note to Dutch Quint NP and [facility] CMA.

## 2021-08-11 NOTE — Telephone Encounter (Signed)
Please follow-up with patient and be sure he is doing ok today. Thank you

## 2021-08-12 DIAGNOSIS — R8290 Unspecified abnormal findings in urine: Secondary | ICD-10-CM | POA: Diagnosis not present

## 2021-08-12 DIAGNOSIS — R109 Unspecified abdominal pain: Secondary | ICD-10-CM | POA: Diagnosis not present

## 2021-08-18 ENCOUNTER — Ambulatory Visit (INDEPENDENT_AMBULATORY_CARE_PROVIDER_SITE_OTHER): Payer: PPO | Admitting: Internal Medicine

## 2021-08-18 ENCOUNTER — Other Ambulatory Visit: Payer: Self-pay

## 2021-08-18 ENCOUNTER — Encounter: Payer: Self-pay | Admitting: Internal Medicine

## 2021-08-18 DIAGNOSIS — K219 Gastro-esophageal reflux disease without esophagitis: Secondary | ICD-10-CM | POA: Diagnosis not present

## 2021-08-18 DIAGNOSIS — R410 Disorientation, unspecified: Secondary | ICD-10-CM

## 2021-08-18 DIAGNOSIS — A0471 Enterocolitis due to Clostridium difficile, recurrent: Secondary | ICD-10-CM

## 2021-08-18 DIAGNOSIS — J449 Chronic obstructive pulmonary disease, unspecified: Secondary | ICD-10-CM

## 2021-08-18 MED ORDER — QUETIAPINE FUMARATE 25 MG PO TABS: 25.0000 mg | ORAL_TABLET | Freq: Every day | ORAL | 11 refills | Status: DC

## 2021-08-18 MED ORDER — TRAMADOL HCL 50 MG PO TABS: 50.0000 mg | ORAL_TABLET | Freq: Every day | ORAL | 0 refills | Status: DC | PRN

## 2021-08-18 NOTE — Patient Instructions (Signed)
Please stop the pantoprazole. I would put him on famotidine (pepcid) if he develops acid symptoms.

## 2021-08-18 NOTE — Assessment & Plan Note (Signed)
Breathing has been okay on symbicort 160/4.5 daily

## 2021-08-18 NOTE — Assessment & Plan Note (Signed)
Still on the vanco 125 bid---wife will do the wean Last tests negative in the hospital

## 2021-08-18 NOTE — Assessment & Plan Note (Signed)
Given recurrent C dif--will stop pantoprazole If symptoms recur---would try famotidine

## 2021-08-18 NOTE — Progress Notes (Signed)
Subjective:    Patient ID: Leon Estes, male    DOB: July 04, 1931, 88 y.o.   MRN: [id number]  HPI Here with wife for hospital follow up  Doing "half" Having neck pain--across the back of neck (wife thinks it is just tension) [place] off stool 3 days ago---missed it and fell on butt (was holding a bowl of ice cream and didn't spill)  Reviewed from a month ago Restarted vancomycin for recurrent diarrhea Then delirium--wife wondered if it was dehydration C diff was negative there--but he continues on the vancomycin  Had some RUQ abdominal pain Went to [facility] 12[facility]---nothing worrisome  found Gone now No heartburn   No more confusion Using the seroquel every night---it helps him sleep No hallucinations, etc  Breathing is okay Only using the symbicort in the morning No cough  Current Outpatient Medications on File Prior to Visit  Medication Sig Dispense Refill   acetaminophen (TYLENOL) 500 MG tablet Take 1,000 mg by mouth every 6 (six) hours as needed for moderate pain or headache.     aspirin EC 81 MG tablet Take 81 mg by mouth daily.     budesonide-formoterol (SYMBICORT) 160-4.5 MCG/ACT inhaler INHALE 2 PUFFS INTO THE LUNGS TWICE DAILY 10.2 g 5   gabapentin (NEURONTIN) 300 MG capsule TAKE 1 CAPSULE BY MOUTH ONCE DAILY (Patient taking differently: Take 300 mg by mouth at bedtime.) 90 capsule 3   Multiple Vitamin (MULTIVITAMIN WITH MINERALS) TABS tablet Take 1 tablet by mouth daily.     pantoprazole (PROTONIX) 40 MG tablet Take 1 tablet (40 mg total) by mouth at bedtime. Or morning on empty stomach (Patient taking differently: Take 40 mg by mouth daily before breakfast.) 1 tablet 0   potassium chloride (KLOR-CON) 10 MEQ tablet Take 1 tablet (10 mEq total) by mouth daily. 90 tablet 3   Probiotic Product (PROBIOTIC DAILY PO) Take 1 capsule by mouth daily.     QUEtiapine (SEROQUEL) 25 MG tablet Take 1 tablet (25 mg total) by mouth at bedtime as needed (Confusion or agitation.). 5  tablet 0   simvastatin (ZOCOR) 40 MG tablet Take 40 mg by mouth every evening.     tamsulosin (FLOMAX) 0.4 MG CAPS capsule TAKE 1 CAPSULE BY MOUTH ONCE DAILY 90 capsule 3   traMADol (ULTRAM) 50 MG tablet TAKE 1 TABLET BY MOUTH ONCE A DAY AS NEEDED FOR PAIN (Patient taking differently: Take 50 mg by mouth daily as needed for moderate pain.) 30 tablet 0   vancomycin (VANCOCIN) 125 MG capsule Take 1 capsule (125 mg total) by mouth 4 (four) times daily. For 2 weeks. Then twice a day for 1 week, then daily for 1 week. Finally, continue it every other day for 8 weeks (Patient taking differently: Take 125 mg by mouth See admin instructions. 1 capsule qid x 2 weeks, then 1 capsule bid x 1 week, then 1 capsule qd x 1 week, then 1 capsule every other day x 8 weeks.) 105 capsule 0   No current facility-administered medications on file prior to visit.    Allergies  Allergen Reactions   Sulfa Antibiotics Rash   Benzalkonium Chloride     "Benzalkonium chloride is a quaternary ammonium antiseptic and disinfectant with actions and uses similar to those of other cationic surfactants. It is also used as an antimicrobial preservative for pharmaceutical products."   Neosporin [Neomycin-Bacitracin Zn-Polymyx]    Augmentin [Amoxicillin-Pot Clavulanate] Rash   Terbinafine Rash   Terbinafine And Related Rash    Past  Medical History:  Diagnosis Date   Asthma    BPH (benign prostatic hypertrophy)    Cataract    Chronic renal disease, stage III (HCC)    Chronic sinusitis    COPD (chronic obstructive pulmonary disease) (HCC)    Coronary artery disease    2 stents   Gall stones    Hearing loss    DOES NOT WEAR HEARING AIDS   High cholesterol    Hypertension    Macular degeneration    right eye    Past Surgical History:  Procedure Laterality Date   APPENDECTOMY     CORONARY ANGIOPLASTY WITH STENT PLACEMENT  1998   2 stents   TRANSURETHRAL RESECTION OF PROSTATE      Family History  Problem Relation  Age of Onset   Diabetes Father    Heart disease Brother     Social History   Socioeconomic History   Marital status: Married    Spouse name: Not on file   Number of children: 4   Years of education: Not on file   Highest education level: Not on file  Occupational History   Occupation: Drove truck and concrete work  Tobacco Use   Smoking status: Former    Types: Cigarettes    Quit date: 08/13/2005    Years since quitting: 16.0   Smokeless tobacco: Current    Types: Chew   Tobacco comments:    discussed stopping this (only once in a while)  Vaping Use   Vaping Use: Never used  Substance and Sexual Activity   Alcohol use: No   Drug use: No   Sexual activity: Not on file  Other Topics Concern   Not on file  Social History Narrative   No living will   Wife, then [place] daughter should make decisions   Would accept resuscitation   Would probably accept tube feeds   Social Determinants of Health   Financial Resource Strain: Not on file  Food Insecurity: Not on file  Transportation Needs: Not on file  Physical Activity: Not on file  Stress: Not on file  Social Connections: Not on file  Intimate Partner Violence: Not on file   Review of Systems Appetite is good Did lose some weight     Objective:   Physical Exam Constitutional:      Appearance: Normal appearance.  Cardiovascular:     Rate and Rhythm: Normal rate and regular rhythm.     Heart sounds: No murmur heard.   No gallop.     Comments: trigeminy Pulmonary:     Effort: Pulmonary effort is normal.     Breath sounds: Normal breath sounds. No wheezing or rales.  Abdominal:     Palpations: Abdomen is soft.     Tenderness: There is no abdominal tenderness.  Musculoskeletal:     Right lower leg: No edema.     Left lower leg: No edema.  Neurological:     Mental Status: He is alert.           Assessment & Plan:

## 2021-08-18 NOTE — Assessment & Plan Note (Signed)
Recurrent Unclear etiology--might have had some dehydration He likes the seroquel 25mg  at bedtime --helps him sleep. Will continue Likely some mild cognitive impairment

## 2021-08-30 DIAGNOSIS — D492 Neoplasm of unspecified behavior of bone, soft tissue, and skin: Secondary | ICD-10-CM | POA: Diagnosis not present

## 2021-08-30 DIAGNOSIS — I872 Venous insufficiency (chronic) (peripheral): Secondary | ICD-10-CM | POA: Diagnosis not present

## 2021-09-07 DIAGNOSIS — R21 Rash and other nonspecific skin eruption: Secondary | ICD-10-CM | POA: Diagnosis not present

## 2021-09-12 ENCOUNTER — Encounter: Payer: Self-pay | Admitting: Cardiovascular Disease

## 2021-09-12 ENCOUNTER — Other Ambulatory Visit: Payer: Self-pay

## 2021-09-12 ENCOUNTER — Ambulatory Visit: Payer: PPO | Admitting: Cardiovascular Disease

## 2021-09-12 VITALS — BP 116/68 | HR 60 | Ht 66.0 in | Wt 145.6 lb

## 2021-09-12 DIAGNOSIS — I2510 Atherosclerotic heart disease of native coronary artery without angina pectoris: Secondary | ICD-10-CM

## 2021-09-12 DIAGNOSIS — D508 Other iron deficiency anemias: Secondary | ICD-10-CM | POA: Diagnosis not present

## 2021-09-12 LAB — CBC
Hematocrit: 28.7 % — ABNORMAL LOW (ref 37.5–51.0)
Hemoglobin: 9.4 g/dL — ABNORMAL LOW (ref 13.0–17.7)
MCH: 26.5 pg — ABNORMAL LOW (ref 26.6–33.0)
MCHC: 32.8 g/dL (ref 31.5–35.7)
MCV: 81 fL (ref 79–97)
Platelets: 234 10*3/uL (ref 150–450)
RBC: 3.55 x10E6/uL — ABNORMAL LOW (ref 4.14–5.80)
RDW: 15 % (ref 11.6–15.4)
WBC: 10.5 10*3/uL (ref 3.4–10.8)

## 2021-09-12 NOTE — Patient Instructions (Signed)
Medication Instructions:  Your physician has recommended you make the following change in your medication:   STOP Aspirin   *If you need a refill on your cardiac medications before your next appointment, please call your pharmacy*   Lab Work: TODAY:  CBC  If you have labs (blood work) drawn today and your tests are completely normal, you will receive your results only by: [place] (if you have MyChart) OR A paper copy in the mail If you have any lab test that is abnormal or we need to change your treatment, we will call you to review the results.   Testing/Procedures: None ordered   Follow-Up: At [facility], you and your health needs are our priority.  As part of our continuing mission to provide you with exceptional heart care, we have created designated Provider Care Teams.  These Care Teams include your primary Cardiologist (physician) and Advanced Practice Providers (APPs -  Physician Assistants and Nurse Practitioners) who all work together to provide you with the care you need, when you need it.  We recommend signing up for the patient portal called "MyChart".  Sign up information is provided on this After Visit Summary.  MyChart is used to connect with patients for Virtual Visits (Telemedicine).  Patients are able to view lab/test results, encounter notes, upcoming appointments, etc.  Non-urgent messages can be sent to your provider as well.   To learn more about what you can do with MyChart, go to [email or web address].    Your next appointment:   12 month(s)  The format for your next appointment:   In Person  Provider:       Other Instructions

## 2021-09-12 NOTE — Progress Notes (Signed)
Cardiology Office Note   Date:  09/12/2021   ID:  [name], [name] 1931/04/04, MRN [id number]  PCP:  Venia Carbon, MD  Cardiologist:   Mertie Moores, MD / previously saw [name], MD at [facility] Complaint  Patient presents with   Coronary Artery Disease        Problem list: 1. Coronary artery disease-status post stenting of the right coronary artery in 1997 2. COPD 3. Hyperlipidemia     Leon Estes is a 88 y.o. male who presents for follow up of his CP He had am MI in 1997.  Had 2 stents placed by Dr. Glade Lloyd. Hs seen [facility] in the past, wants to see a cardiologist here in [place] .  Has done well,  No further of CP.  Exercises regularly,  Mows 3 acres.   Goes bowling once a week.  Also has some pain down his leg.  Able to weed-eat without any problems  Sees Dr. Delfina Redwood. Was recently found to have mild anemia.  Was started on iron at that time .   January 13, 2016:  Doing well.  Still very active.  Mows his 3 acres and weed-eats .   Bowls on wednesdays   Aug. 28, 2018: Doing well,  No CP or dyspnea.  Still bowling - now bowls around 180 .  Sees Dr. Delfina Redwood  - manages his lipids   April 15, 2019: Leon Estes is seen today for follow-up visit. Doing well.   Has not been bowling - closed for COVID No CP or dyspnea  Has some dyspnea due to his COPD .  - takes an inhaler regularly  Has had some leg pain .  Has resolve   April 26, 2020: Leon Estes is seen back today for follow-up of his coronary artery disease.  He has some shortness of breath due to his COPD. Has generalized fatiuged  No angina started on antibiotics.   April 29,2022; Leon Estes is seen today for follow up of his CAD Has chronic dyspnea due to his COPD  Was hospitalized with RUQ pain , WBC 16K Was started on antibiotics. No need for surgery at this point .   There was a comment about PAF. I cannot see any ECGs that show atrial fib  He has ectopic atrial rhythm , PVCs on tele during  the hospitalization   September 12, 2021: Leon Estes is seen today for a follow-up visit.  He has a history of coronary artery disease, COPD. No angina  No real exercise   His Hb is 8 in Dec. While in the hospital  Will DC ASA   Past Medical History:  Diagnosis Date   Asthma    BPH (benign prostatic hypertrophy)    Cataract    Chronic renal disease, stage III (HCC)    Chronic sinusitis    COPD (chronic obstructive pulmonary disease) (HCC)    Coronary artery disease    2 stents   Gall stones    Hearing loss    DOES NOT WEAR HEARING AIDS   High cholesterol    Hypertension    Macular degeneration    right eye    Past Surgical History:  Procedure Laterality Date   APPENDECTOMY     CORONARY ANGIOPLASTY WITH STENT PLACEMENT  1998   2 stents   TRANSURETHRAL RESECTION OF PROSTATE       Current Outpatient Medications  Medication Sig Dispense Refill   acetaminophen (TYLENOL) 500 MG tablet Take 1,000 mg by  mouth every 6 (six) hours as needed for moderate pain or headache.     budesonide-formoterol (SYMBICORT) 160-4.5 MCG/ACT inhaler INHALE 2 PUFFS INTO THE LUNGS TWICE DAILY 10.2 g 5   gabapentin (NEURONTIN) 300 MG capsule TAKE 1 CAPSULE BY MOUTH ONCE DAILY (Patient taking differently: Take 300 mg by mouth at bedtime.) 90 capsule 3   lisinopril-hydrochlorothiazide (ZESTORETIC) 20-25 MG tablet Take by mouth. Take by mouth.     Multiple Vitamin (MULTIVITAMIN WITH MINERALS) TABS tablet Take 1 tablet by mouth daily.     potassium chloride (KLOR-CON) 10 MEQ tablet Take 1 tablet (10 mEq total) by mouth daily. 90 tablet 3   Probiotic Product (PROBIOTIC DAILY PO) Take 1 capsule by mouth daily.     QUEtiapine (SEROQUEL) 25 MG tablet Take 1 tablet (25 mg total) by mouth at bedtime. 30 tablet 11   simvastatin (ZOCOR) 40 MG tablet Take 40 mg by mouth every evening.     tamsulosin (FLOMAX) 0.4 MG CAPS capsule TAKE 1 CAPSULE BY MOUTH ONCE DAILY 90 capsule 3   traMADol (ULTRAM) 50 MG tablet Take 1  tablet (50 mg total) by mouth daily as needed. TAKE 1 TABLET BY MOUTH ONCE A DAY AS NEEDED FOR PAIN 30 tablet 0   triamcinolone cream (KENALOG) 0.5 % Apply topically.     vancomycin (VANCOCIN) 125 MG capsule Take 1 capsule (125 mg total) by mouth 4 (four) times daily. For 2 weeks. Then twice a day for 1 week, then daily for 1 week. Finally, continue it every other day for 8 weeks (Patient not taking: Reported on 09/12/2021) 105 capsule 0   No current facility-administered medications for this visit.    Allergies:   Sulfa antibiotics, Bacitracin-polymyxin b, Benzalkonium chloride, Neosporin [neomycin-bacitracin zn-polymyx], Sulfamethoxazole-trimethoprim, Augmentin [amoxicillin-pot clavulanate], Terbinafine, and Terbinafine and related    Social History:  The patient  reports that he quit smoking about 16 years ago. His smoking use included cigarettes. His smokeless tobacco use includes chew. He reports that he does not drink alcohol and does not use drugs.   Family History:  The patient's family history includes Diabetes in his father; Heart disease in his brother.    ROS:  Please see the history of present illness.  Noted in HPI.  Otherwise review of systems is negative.  Physical Exam: Blood pressure 116/68, pulse 60, height 5\' 6"  (1.676 m), weight 145 lb 9.6 oz (66 kg), SpO2 99 %.  GEN:  elderly male,  moderate dementia  no acute distress HEENT: Normal NECK: No JVD; No carotid bruits LYMPHATICS: No lymphadenopathy CARDIAC: RRR , no murmurs, rubs, gallops RESPIRATORY:  Clear to auscultation without rales, wheezing or rhonchi  ABDOMEN: Soft, non-tender, non-distended MUSCULOSKELETAL:  No edema; No deformity  SKIN: Warm and dry NEUROLOGIC:  Alert and oriented x 3    EKG:        Recent Labs: 12/27/2020: B Natriuretic Peptide 689.3 04/18/2021: TSH 1.050 07/24/2021: ALT 23 07/29/2021: BUN 8; Creatinine, Ser 1.00; Magnesium 1.8; Potassium 3.9; Sodium 140 09/12/2021: Hemoglobin 9.4;  Platelets 234    Lipid Panel    Component Value Date/Time   TRIG 107 01/09/2021 0400      Wt Readings from Last 3 Encounters:  09/12/21 145 lb 9.6 oz (66 kg)  08/18/21 139 lb (63 kg)  07/24/21 160 lb (72.6 kg)      Other studies Reviewed: Additional studies/ records that were reviewed today include: . Review of the above records demonstrates:    ASSESSMENT AND PLAN:  1. Coronary artery disease- .   No angina. He is anemic.   Will DC asa He will need his anemia followed up       2. COPD -     3. Hyperlipidemia -   stable    Current medicines are reviewed at length with the patient today.  The patient does not have concerns regarding medicines.  The following changes have been made:  no change  Labs/ tests ordered today include:   Orders Placed This Encounter  Procedures   CBC    Disposition:      Mertie Moores, MD  09/12/2021 5:20 PM    [facility] [place], [place],   [address] Phone: [phone]; Fax: [phone]

## 2021-09-15 ENCOUNTER — Encounter: Payer: Self-pay | Admitting: Internal Medicine

## 2021-09-15 ENCOUNTER — Other Ambulatory Visit: Payer: Self-pay

## 2021-09-15 ENCOUNTER — Ambulatory Visit (INDEPENDENT_AMBULATORY_CARE_PROVIDER_SITE_OTHER): Payer: PPO | Admitting: Internal Medicine

## 2021-09-15 DIAGNOSIS — L280 Lichen simplex chronicus: Secondary | ICD-10-CM | POA: Insufficient documentation

## 2021-09-15 DIAGNOSIS — L309 Dermatitis, unspecified: Secondary | ICD-10-CM

## 2021-09-15 MED ORDER — PREDNISONE 20 MG PO TABS: 40.0000 mg | ORAL_TABLET | Freq: Every day | ORAL | 1 refills | Status: DC

## 2021-09-15 NOTE — Progress Notes (Signed)
Subjective:    Patient ID: Leon Estes, male    DOB: 04-Jul-1931, 88 y.o.   MRN: [id number]  HPI Here with wife due to rash  Has rash on arms and back Itching and stinging Started near a month ago Went to [facility] twice and Dr Gloriajean Dell triamcinolone cream (helped briefly)  Diarrhea gone On every other day vancomycin   Current Outpatient Medications on File Prior to Visit  Medication Sig Dispense Refill   acetaminophen (TYLENOL) 500 MG tablet Take 1,000 mg by mouth every 6 (six) hours as needed for moderate pain or headache.     budesonide-formoterol (SYMBICORT) 160-4.5 MCG/ACT inhaler INHALE 2 PUFFS INTO THE LUNGS TWICE DAILY 10.2 g 5   gabapentin (NEURONTIN) 300 MG capsule TAKE 1 CAPSULE BY MOUTH ONCE DAILY (Patient taking differently: Take 300 mg by mouth at bedtime.) 90 capsule 3   Multiple Vitamin (MULTIVITAMIN WITH MINERALS) TABS tablet Take 1 tablet by mouth daily.     potassium chloride (KLOR-CON) 10 MEQ tablet Take 1 tablet (10 mEq total) by mouth daily. 90 tablet 3   Probiotic Product (PROBIOTIC DAILY PO) Take 1 capsule by mouth daily.     QUEtiapine (SEROQUEL) 25 MG tablet Take 1 tablet (25 mg total) by mouth at bedtime. 30 tablet 11   simvastatin (ZOCOR) 40 MG tablet Take 40 mg by mouth every evening.     tamsulosin (FLOMAX) 0.4 MG CAPS capsule TAKE 1 CAPSULE BY MOUTH ONCE DAILY 90 capsule 3   traMADol (ULTRAM) 50 MG tablet Take 1 tablet (50 mg total) by mouth daily as needed. TAKE 1 TABLET BY MOUTH ONCE A DAY AS NEEDED FOR PAIN 30 tablet 0   triamcinolone cream (KENALOG) 0.5 % Apply topically.     vancomycin (VANCOCIN) 125 MG capsule Take 1 capsule (125 mg total) by mouth 4 (four) times daily. For 2 weeks. Then twice a day for 1 week, then daily for 1 week. Finally, continue it every other day for 8 weeks 105 capsule 0   lisinopril-hydrochlorothiazide (ZESTORETIC) 20-25 MG tablet Take by mouth. Take by mouth. (Patient not taking: Reported on 09/15/2021)     No  current facility-administered medications on file prior to visit.    Allergies  Allergen Reactions   Sulfa Antibiotics Rash   Bacitracin-Polymyxin B     Other reaction(s): rash, swelling   Benzalkonium Chloride     "Benzalkonium chloride is a quaternary ammonium antiseptic and disinfectant with actions and uses similar to those of other cationic surfactants. It is also used as an antimicrobial preservative for pharmaceutical products."   Neosporin [Neomycin-Bacitracin Zn-Polymyx]    Sulfamethoxazole-Trimethoprim     Other reaction(s): rash   Augmentin [Amoxicillin-Pot Clavulanate] Rash   Terbinafine Rash   Terbinafine And Related Rash    Past Medical History:  Diagnosis Date   Asthma    BPH (benign prostatic hypertrophy)    Cataract    Chronic renal disease, stage III (HCC)    Chronic sinusitis    COPD (chronic obstructive pulmonary disease) (HCC)    Coronary artery disease    2 stents   Gall stones    Hearing loss    DOES NOT WEAR HEARING AIDS   High cholesterol    Hypertension    Macular degeneration    right eye    Past Surgical History:  Procedure Laterality Date   APPENDECTOMY     CORONARY ANGIOPLASTY WITH STENT PLACEMENT  1998   2 stents   TRANSURETHRAL RESECTION OF PROSTATE  Family History  Problem Relation Age of Onset   Diabetes Father    Heart disease Brother     Social History   Socioeconomic History   Marital status: Married    Spouse name: Not on file   Number of children: 4   Years of education: Not on file   Highest education level: Not on file  Occupational History   Occupation: Drove truck and concrete work  Tobacco Use   Smoking status: Former    Types: Cigarettes    Quit date: 08/13/2005    Years since quitting: 16.1   Smokeless tobacco: Current    Types: Chew   Tobacco comments:    discussed stopping this (only once in a while)  Vaping Use   Vaping Use: Never used  Substance and Sexual Activity   Alcohol use: No   Drug  use: No   Sexual activity: Not on file  Other Topics Concern   Not on file  Social History Narrative   No living will   Wife, then [place] daughter should make decisions   Would accept resuscitation   Would probably accept tube feeds   Social Determinants of Health   Financial Resource Strain: Not on file  Food Insecurity: Not on file  Transportation Needs: Not on file  Physical Activity: Not on file  Stress: Not on file  Social Connections: Not on file  Intimate Partner Violence: Not on file   Review of Systems Eating okay Weight holding     Objective:   Physical Exam Constitutional:      Appearance: Normal appearance.  Skin:    Comments: Widespread mild maculopapular rash on arms, back and rest of trunk Mostly gone on legs  Neurological:     Mental Status: He is alert.           Assessment & Plan:

## 2021-09-15 NOTE — Assessment & Plan Note (Signed)
Non specific Clearly not infectious Likely reaction to something---if vancomycin, would still want to finish out the Rx Will give course of prednisone to take away the itching and discomfort

## 2021-09-18 ENCOUNTER — Ambulatory Visit: Payer: PPO | Admitting: Family Medicine

## 2021-09-20 ENCOUNTER — Telehealth: Payer: Self-pay | Admitting: Cardiovascular Disease

## 2021-09-20 NOTE — Telephone Encounter (Signed)
Patients wife returned RN's call. Transferred to RN.

## 2021-09-25 ENCOUNTER — Ambulatory Visit (INDEPENDENT_AMBULATORY_CARE_PROVIDER_SITE_OTHER): Payer: PPO | Admitting: Family Medicine

## 2021-09-25 ENCOUNTER — Encounter: Payer: Self-pay | Admitting: Family Medicine

## 2021-09-25 ENCOUNTER — Other Ambulatory Visit: Payer: Self-pay

## 2021-09-25 VITALS — BP 150/68 | HR 62 | Temp 98.2°F | Ht 67.0 in | Wt 145.6 lb

## 2021-09-25 DIAGNOSIS — M1711 Unilateral primary osteoarthritis, right knee: Secondary | ICD-10-CM | POA: Diagnosis not present

## 2021-09-25 DIAGNOSIS — G629 Polyneuropathy, unspecified: Secondary | ICD-10-CM | POA: Diagnosis not present

## 2021-09-25 IMAGING — DX DG ABDOMEN ACUTE W/ 1V CHEST
3 series · 3 of 3 positions shown · non-contrast
Comparison: CT abdomen and pelvis December 27, 2020. Chest radiograph

CLINICAL DATA: Abdominal distension. Reported Clostridium difficile
colitis

EXAM:
DG ABDOMEN ACUTE WITH 1 VIEW CHEST

[chest ap]
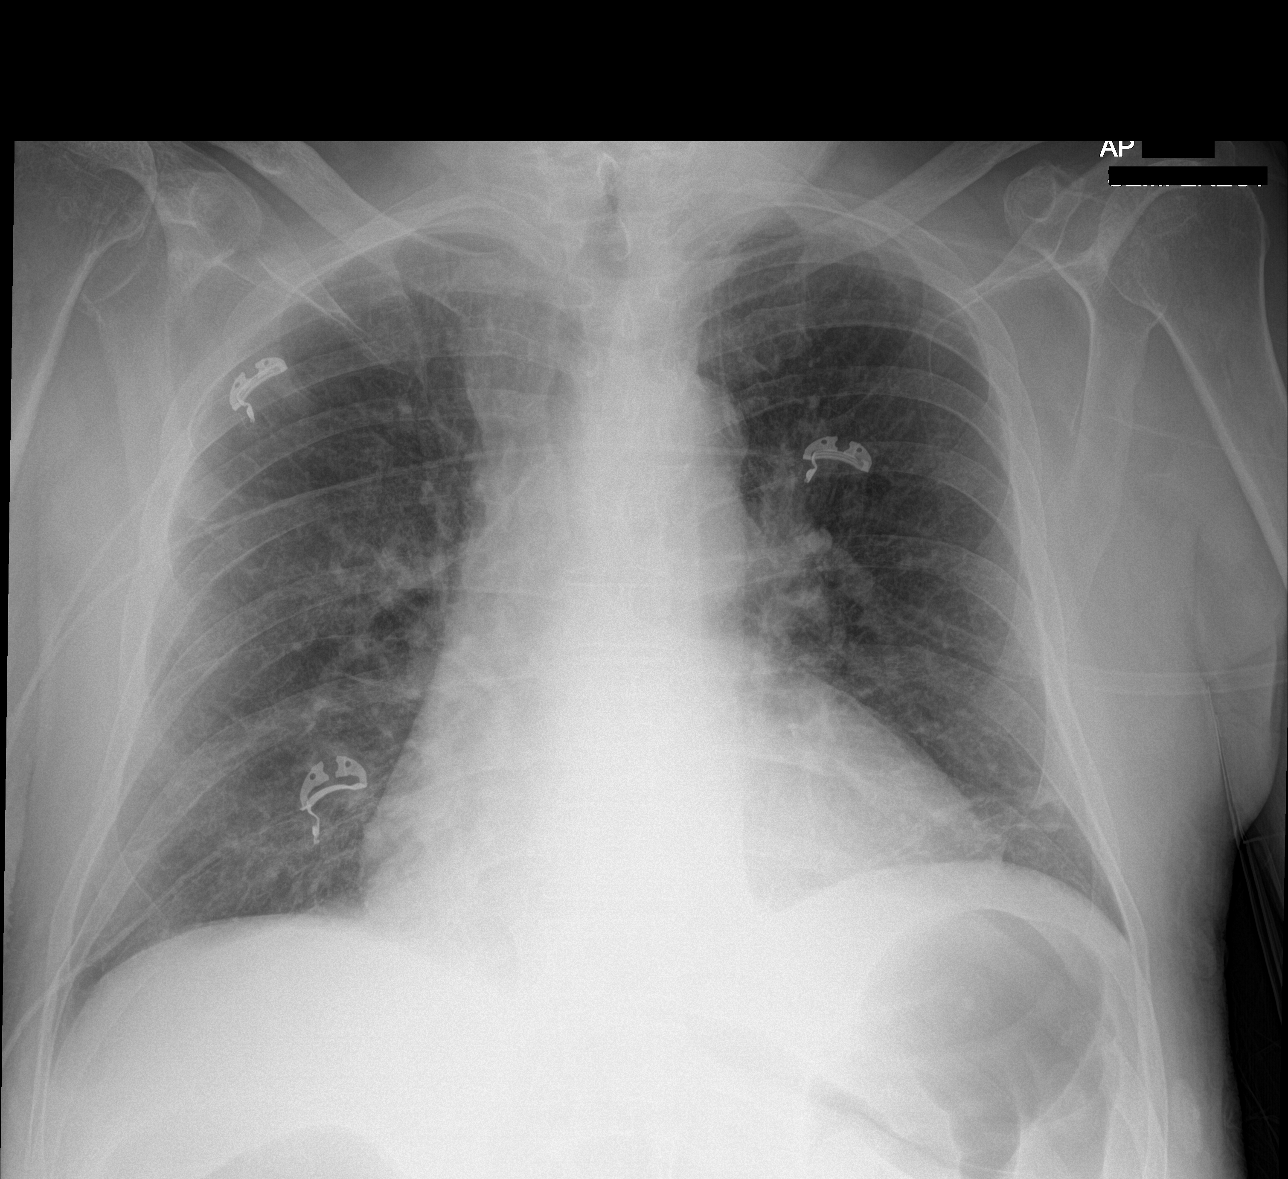

[abdomen erect]
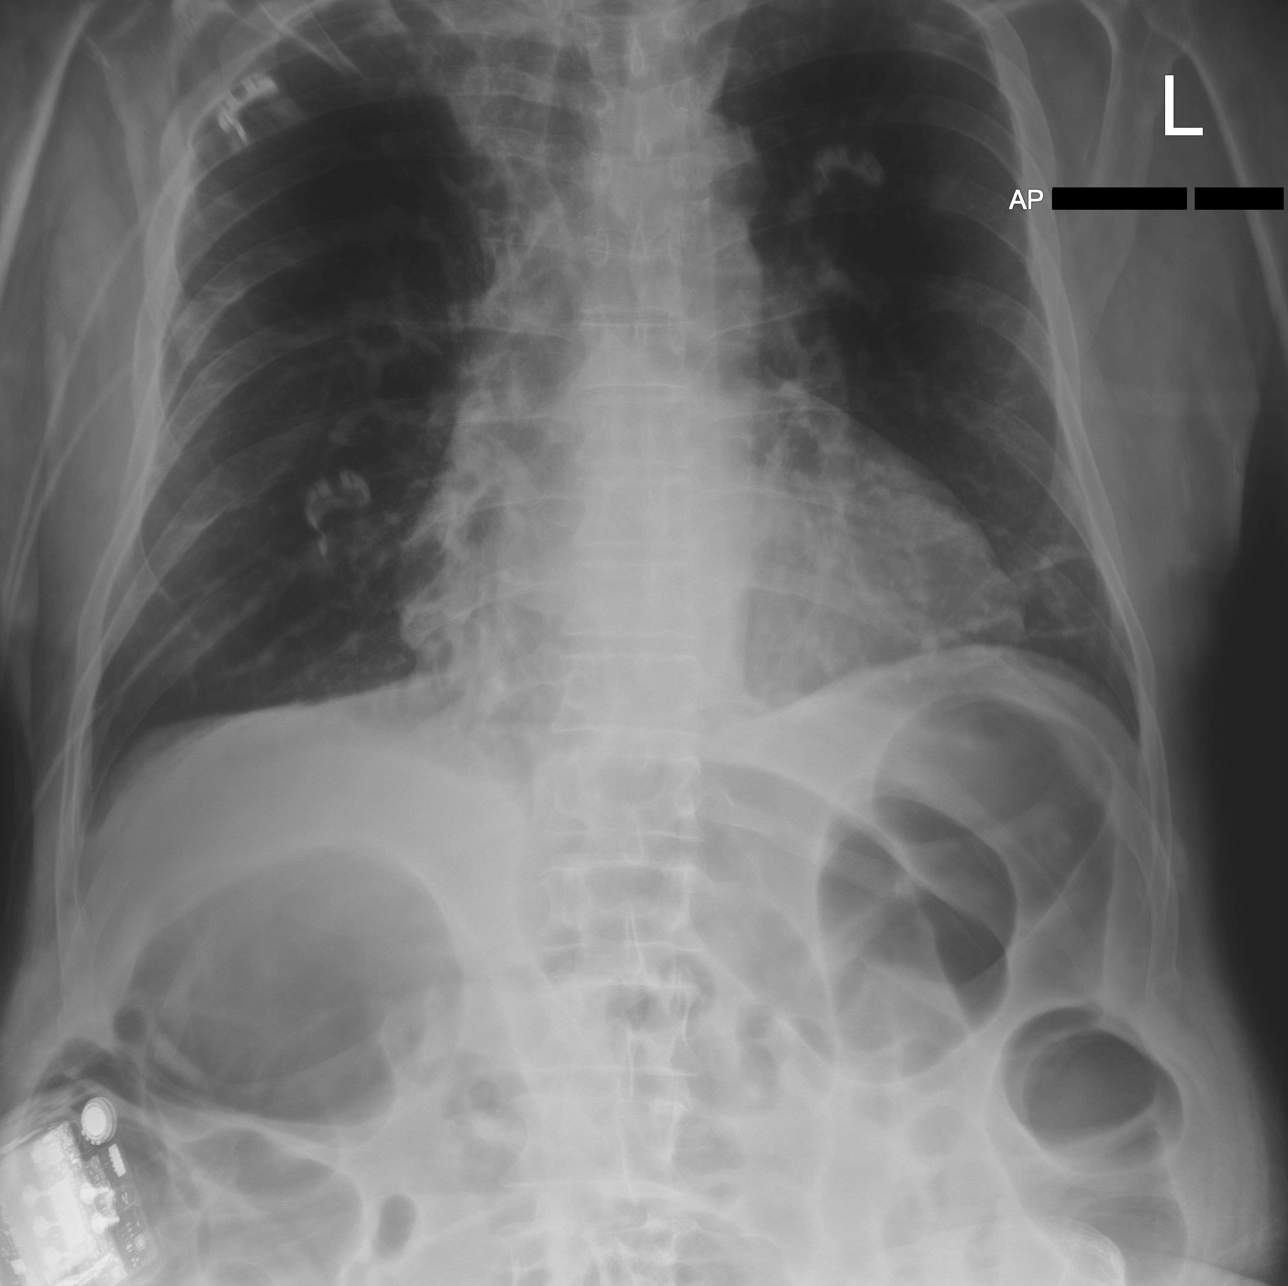

[abdomen supine]
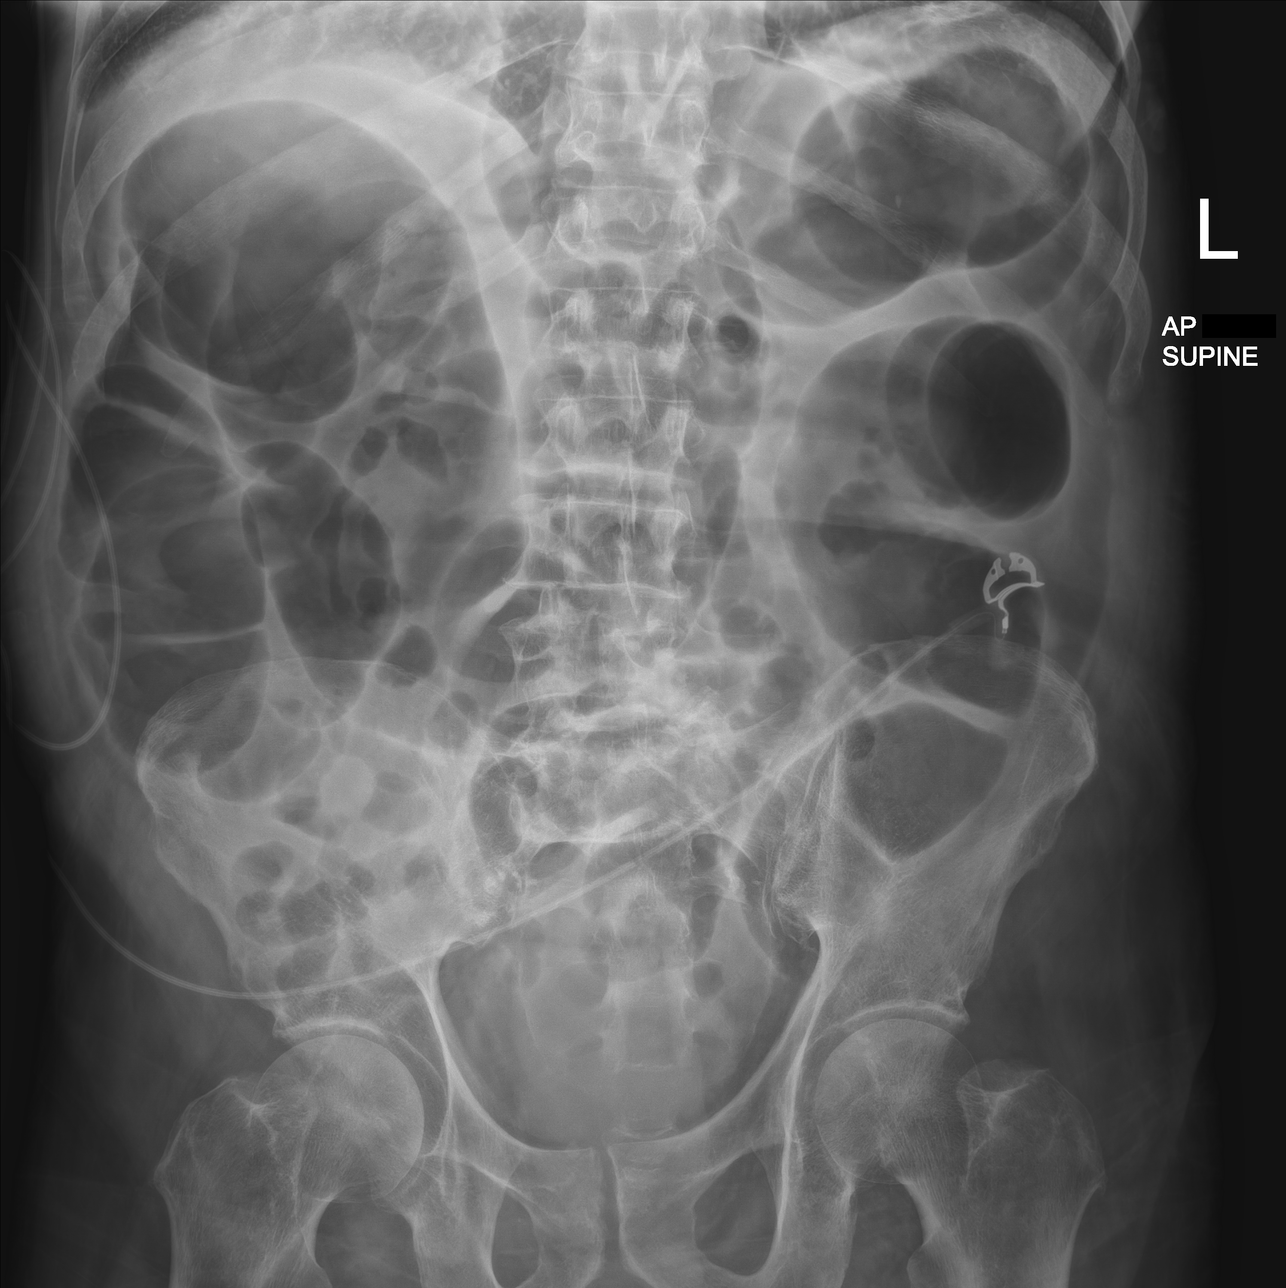

[3 of 3 positions shown; findings below may reference images not displayed]

FINDINGS: PA chest: There is mild left base atelectasis. The lungs elsewhere
are clear. Heart is mildly enlarged with pulmonary vascularity
normal. No appreciable adenopathy.

Supine and upright abdomen: There are loops of mildly dilated colon.
No air-fluid levels. No appreciable small bowel dilatation. No
air-fluid levels. No free air. No abnormal calcifications.
IMPRESSION: Question a degree of colonic ileus or colitis. No obstructing focus
evident. No free air.

Left base atelectasis. Lungs elsewhere clear. Mild cardiac
enlargement.

## 2021-09-25 MED ORDER — GABAPENTIN 400 MG PO CAPS: 400.0000 mg | ORAL_CAPSULE | Freq: Every day | ORAL | 1 refills | Status: DC

## 2021-09-25 NOTE — Patient Instructions (Signed)
Try the higher dose of gabapentin to 400 mg.  (Up from 300 mg)

## 2021-09-25 NOTE — Progress Notes (Signed)
Aria Pickrell T. Jeison Delpilar, MD, [facility] [place], [address]  Phone: [phone]   FAX: [phone]  Leon Estes - 88 y.o. male   MRN [id number]   Date of Birth: Jan 23, 1931  Date: 09/25/2021   PCP: Venia Carbon, MD   Referral: Venia Carbon, MD  Chief Complaint  Patient presents with   Knee Pain    C/o R knee pain.  Denies inury.  Pain occurs when walking and will "go out" causing him to fall.  Occasionally has some swelling. Started several mos ago.  Wants to discuss injection.    Tingling    C/o tingling/numbness in bilateral feet, disturbing sleep.     This visit occurred during the SARS-CoV-2 public health emergency.  Safety protocols were in place, including screening questions prior to the visit, additional usage of staff PPE, and extensive cleaning of exam room while observing appropriate contact time as indicated for disinfecting solutions.   Subjective:   Leon Estes is a 88 y.o. very pleasant male patient with Body mass index is 22.8 kg/m. who presents with the following:  R knee OA.  He is a well-known patient, and is here to follow-up on chronic right knee pain.  He is generally fairly active for age.  Does have some COPD, chronic kidney disease as well as coronary disease.  He also was recently hospitalized for [name], and he is currently on oral vancomycin.  Neuropathy - when he goes to bed, big toes will get numb and tingling.   He is also has some chronic neuropathy, and he is currently on some Neurontin 300 mg.  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   He is primarily here to talk about his right-sided knee pain.  Review of Systems is noted in the HPI, as appropriate  Objective:   BP (!) 150/68 (BP Location: Right Arm, Cuff Size: Normal)    Pulse 62    Temp 98.2 F (36.8 C) (Temporal)    Ht 5\' 7"  (1.702 m)    Wt 145 lb 9 oz (66 kg)    SpO2 95%    BMI 22.80 kg/m    GEN: No acute distress; alert,appropriate. PULM: Breathing comfortably in no respiratory distress PSYCH: Normally interactive.   Right knee: Minimal to mild effusion.  Full extension and flexion to 120.  Stable to varus and valgus stress.  ACL and intact.  PCL intact.  Medial joint line tenderness.  Neg flexion pinch and mcmurrays.  Laboratory and Imaging Data:  Assessment and Plan:     ICD-10-CM   1. Primary osteoarthritis of right knee  M17.11     2. Neuropathy  G62.9      Patient is having some exacerbation right knee pain, at age 68 almost certainly arthritic exacerbation.  He does have some chronic kidney disease as well, and previous very reasonable to do steroid injection today.  I would like to avoid changing any other medications.  He also has some neuropathy, this is being managed by Dr. Silvio Pate.  I am going up to 100 mg as a trial is probably reasonable.  He does think that this is gotten worse over time.  Aspiration/Injection Procedure Note Leon Estes 11/19/1930 Date of procedure: 09/25/2021  Procedure: Large Joint Aspiration / Injection of Knee, R Indications: Pain  Procedure Details Patient verbally consented to procedure. Risks, benefits, and alternatives explained. Sterilely prepped with Chloraprep. Ethyl  cholride used for anesthesia. 9 cc Lidocaine 1% mixed with 1 mL of Kenalog 40 mg injected using the anteromedial approach without difficulty. No complications with procedure and tolerated well. Patient had decreased pain post-injection. Medication: 1 mL of Kenalog 40 mg    Meds ordered this encounter  Medications   gabapentin (NEURONTIN) 400 MG capsule    Sig: Take 1 capsule (400 mg total) by mouth at bedtime.    Dispense:  30 capsule    Refill:  1   Medications Discontinued During This Encounter  Medication Reason   lisinopril-hydrochlorothiazide (ZESTORETIC) 20-25 MG tablet Discontinued by provider   predniSONE (DELTASONE) 20 MG tablet     gabapentin (NEURONTIN) 300 MG capsule    No orders of the defined types were placed in this encounter.   Follow-up: prn  [employer] One speech-to-text software was used for transcription in this dictation.  Possible transcriptional errors can occur using Editor, commissioning.   Signed,  [name]. Tekesha Almgren, MD   Outpatient Encounter Medications as of 09/25/2021  Medication Sig   acetaminophen (TYLENOL) 500 MG tablet Take 1,000 mg by mouth every 6 (six) hours as needed for moderate pain or headache.   budesonide-formoterol (SYMBICORT) 160-4.5 MCG/ACT inhaler INHALE 2 PUFFS INTO THE LUNGS TWICE DAILY   gabapentin (NEURONTIN) 400 MG capsule Take 1 capsule (400 mg total) by mouth at bedtime.   Multiple Vitamin (MULTIVITAMIN WITH MINERALS) TABS tablet Take 1 tablet by mouth daily.   potassium chloride (KLOR-CON) 10 MEQ tablet Take 1 tablet (10 mEq total) by mouth daily.   Probiotic Product (PROBIOTIC DAILY PO) Take 1 capsule by mouth daily.   QUEtiapine (SEROQUEL) 25 MG tablet Take 1 tablet (25 mg total) by mouth at bedtime.   simvastatin (ZOCOR) 40 MG tablet Take 40 mg by mouth every evening.   tamsulosin (FLOMAX) 0.4 MG CAPS capsule TAKE 1 CAPSULE BY MOUTH ONCE DAILY   traMADol (ULTRAM) 50 MG tablet Take 1 tablet (50 mg total) by mouth daily as needed. TAKE 1 TABLET BY MOUTH ONCE A DAY AS NEEDED FOR PAIN   triamcinolone cream (KENALOG) 0.5 % Apply topically.   vancomycin (VANCOCIN) 125 MG capsule Take 1 capsule (125 mg total) by mouth 4 (four) times daily. For 2 weeks. Then twice a day for 1 week, then daily for 1 week. Finally, continue it every other day for 8 weeks   [DISCONTINUED] gabapentin (NEURONTIN) 300 MG capsule TAKE 1 CAPSULE BY MOUTH ONCE DAILY (Patient taking differently: Take 300 mg by mouth at bedtime.)   [DISCONTINUED] predniSONE (DELTASONE) 20 MG tablet Take 2 tablets (40 mg total) by mouth daily. For 5 days, then 1 tab daily for 5 days.   [DISCONTINUED]  lisinopril-hydrochlorothiazide (ZESTORETIC) 20-25 MG tablet Take by mouth. Take by mouth. (Patient not taking: Reported on 09/15/2021)   No facility-administered encounter medications on file as of 09/25/2021.

## 2021-09-28 ENCOUNTER — Other Ambulatory Visit: Payer: Self-pay

## 2021-09-28 ENCOUNTER — Ambulatory Visit (INDEPENDENT_AMBULATORY_CARE_PROVIDER_SITE_OTHER): Payer: PPO | Admitting: Internal Medicine

## 2021-09-28 ENCOUNTER — Encounter: Payer: Self-pay | Admitting: Internal Medicine

## 2021-09-28 DIAGNOSIS — K2100 Gastro-esophageal reflux disease with esophagitis, without bleeding: Secondary | ICD-10-CM | POA: Diagnosis not present

## 2021-09-28 MED ORDER — OMEPRAZOLE 20 MG PO CPDR: 20.0000 mg | DELAYED_RELEASE_CAPSULE | Freq: Two times a day (BID) | ORAL | 3 refills | Status: DC

## 2021-09-28 NOTE — Assessment & Plan Note (Signed)
Clear reflux symptoms Discussed limiting coffee, caffeine, etc Will start omeprazole 20 bid for now. If symptoms resolve by next month's visit, will go back to daily Stop the pepcid

## 2021-09-28 NOTE — Progress Notes (Signed)
Subjective:    Patient ID: Leon Estes, male    DOB: [birth year], 88 y.o.   MRN: [id number]  HPI Here with wife due to some stomach pain  Having pain substernal ---burning when eating Some trouble swallowing---food gets stuck Some pain lower as well Milk helps a little---and ice cream Is taking daily famotidine 20mg   No N/V Appetite is okay  Current Outpatient Medications on File Prior to Visit  Medication Sig Dispense Refill   acetaminophen (TYLENOL) 500 MG tablet Take 1,000 mg by mouth every 6 (six) hours as needed for moderate pain or headache.     budesonide-formoterol (SYMBICORT) 160-4.5 MCG/ACT inhaler INHALE 2 PUFFS INTO THE LUNGS TWICE DAILY 10.2 g 5   gabapentin (NEURONTIN) 400 MG capsule Take 1 capsule (400 mg total) by mouth at bedtime. 30 capsule 1   Multiple Vitamin (MULTIVITAMIN WITH MINERALS) TABS tablet Take 1 tablet by mouth daily.     potassium chloride (KLOR-CON) 10 MEQ tablet Take 1 tablet (10 mEq total) by mouth daily. 90 tablet 3   Probiotic Product (PROBIOTIC DAILY PO) Take 1 capsule by mouth daily.     QUEtiapine (SEROQUEL) 25 MG tablet Take 1 tablet (25 mg total) by mouth at bedtime. 30 tablet 11   simvastatin (ZOCOR) 40 MG tablet Take 40 mg by mouth every evening.     tamsulosin (FLOMAX) 0.4 MG CAPS capsule TAKE 1 CAPSULE BY MOUTH ONCE DAILY 90 capsule 3   traMADol (ULTRAM) 50 MG tablet Take 1 tablet (50 mg total) by mouth daily as needed. TAKE 1 TABLET BY MOUTH ONCE A DAY AS NEEDED FOR PAIN 30 tablet 0   triamcinolone cream (KENALOG) 0.5 % Apply topically.     vancomycin (VANCOCIN) 125 MG capsule Take 1 capsule (125 mg total) by mouth 4 (four) times daily. For 2 weeks. Then twice a day for 1 week, then daily for 1 week. Finally, continue it every other day for 8 weeks (Patient taking differently: Take 125 mg by mouth 4 (four) times daily. For 2 weeks. Then twice a day for 1 week, then daily for 1 week. Finally, continue it every other day for 8 weeks.  09/28/21 Has 6 tablets left) 105 capsule 0   No current facility-administered medications on file prior to visit.    Allergies  Allergen Reactions   Sulfa Antibiotics Rash   Bacitracin-Polymyxin B     Other reaction(s): rash, swelling   Benzalkonium Chloride     "Benzalkonium chloride is a quaternary ammonium antiseptic and disinfectant with actions and uses similar to those of other cationic surfactants. It is also used as an antimicrobial preservative for pharmaceutical products."   Neosporin [Neomycin-Bacitracin Zn-Polymyx]    Sulfamethoxazole-Trimethoprim     Other reaction(s): rash   Augmentin [Amoxicillin-Pot Clavulanate] Rash   Terbinafine Rash   Terbinafine And Related Rash    Past Medical History:  Diagnosis Date   Asthma    BPH (benign prostatic hypertrophy)    Cataract    Chronic renal disease, stage III (HCC)    Chronic sinusitis    COPD (chronic obstructive pulmonary disease) (HCC)    Coronary artery disease    2 stents   Gall stones    Hearing loss    DOES NOT WEAR HEARING AIDS   High cholesterol    Hypertension    Macular degeneration    right eye    Past Surgical History:  Procedure Laterality Date   APPENDECTOMY     CORONARY ANGIOPLASTY WITH STENT  PLACEMENT  1998   2 stents   TRANSURETHRAL RESECTION OF PROSTATE      Family History  Problem Relation Age of Onset   Diabetes Father    Heart disease Brother     Social History   Socioeconomic History   Marital status: Married    Spouse name: Not on file   Number of children: 4   Years of education: Not on file   Highest education level: Not on file  Occupational History   Occupation: Drove truck and concrete work  Tobacco Use   Smoking status: Former    Types: Cigarettes    Quit date: 08/13/2005    Years since quitting: 16.1    Passive exposure: Past   Smokeless tobacco: Current    Types: Chew   Tobacco comments:    discussed stopping this (only once in a while)  Vaping Use   Vaping  Use: Never used  Substance and Sexual Activity   Alcohol use: No   Drug use: No   Sexual activity: Not on file  Other Topics Concern   Not on file  Social History Narrative   No living will   Wife, then [place] daughter should make decisions   Would accept resuscitation   Would probably accept tube feeds   Social Determinants of Health   Financial Resource Strain: Not on file  Food Insecurity: Not on file  Transportation Needs: Not on file  Physical Activity: Not on file  Stress: Not on file  Social Connections: Not on file  Intimate Partner Violence: Not on file   Review of Systems Still has spells of 2-3 stools a day---but still solid Almost done with vancomycin No SOb     Objective:   Physical Exam Constitutional:      Appearance: Normal appearance.  Cardiovascular:     Rate and Rhythm: Normal rate and regular rhythm.     Heart sounds: No murmur heard.   No gallop.  Pulmonary:     Effort: Pulmonary effort is normal.     Breath sounds: Normal breath sounds. No wheezing or rales.  Abdominal:     General: Bowel sounds are normal. There is no distension.     Palpations: Abdomen is soft.     Tenderness: There is no abdominal tenderness. There is no guarding or rebound.  Musculoskeletal:     Cervical back: Neck supple.     Right lower leg: No edema.     Left lower leg: No edema.  Lymphadenopathy:     Cervical: No cervical adenopathy.  Neurological:     Mental Status: He is alert.           Assessment & Plan:

## 2021-10-02 DIAGNOSIS — L570 Actinic keratosis: Secondary | ICD-10-CM | POA: Diagnosis not present

## 2021-10-02 DIAGNOSIS — L538 Other specified erythematous conditions: Secondary | ICD-10-CM | POA: Diagnosis not present

## 2021-10-02 DIAGNOSIS — L820 Inflamed seborrheic keratosis: Secondary | ICD-10-CM | POA: Diagnosis not present

## 2021-10-02 DIAGNOSIS — L821 Other seborrheic keratosis: Secondary | ICD-10-CM | POA: Diagnosis not present

## 2021-10-02 DIAGNOSIS — D2261 Melanocytic nevi of right upper limb, including shoulder: Secondary | ICD-10-CM | POA: Diagnosis not present

## 2021-10-02 DIAGNOSIS — D225 Melanocytic nevi of trunk: Secondary | ICD-10-CM | POA: Diagnosis not present

## 2021-10-02 DIAGNOSIS — D2262 Melanocytic nevi of left upper limb, including shoulder: Secondary | ICD-10-CM | POA: Diagnosis not present

## 2021-10-09 ENCOUNTER — Encounter (HOSPITAL_COMMUNITY): Payer: Self-pay | Admitting: Family Medicine

## 2021-10-09 ENCOUNTER — Emergency Department (HOSPITAL_COMMUNITY): Payer: PPO

## 2021-10-09 ENCOUNTER — Inpatient Hospital Stay (HOSPITAL_COMMUNITY)
Admission: EM | Admit: 2021-10-09 | Discharge: 2021-10-17 | DRG: 871 | Disposition: A | Discharge status: Home / Self Care | Payer: PPO | Attending: Family Medicine | Admitting: Family Medicine

## 2021-10-09 DIAGNOSIS — E871 Hypo-osmolality and hyponatremia: Secondary | ICD-10-CM | POA: Diagnosis not present

## 2021-10-09 DIAGNOSIS — Z515 Encounter for palliative care: Secondary | ICD-10-CM | POA: Diagnosis not present

## 2021-10-09 DIAGNOSIS — A419 Sepsis, unspecified organism: Secondary | ICD-10-CM | POA: Diagnosis present

## 2021-10-09 DIAGNOSIS — N400 Enlarged prostate without lower urinary tract symptoms: Secondary | ICD-10-CM | POA: Diagnosis not present

## 2021-10-09 DIAGNOSIS — D631 Anemia in chronic kidney disease: Secondary | ICD-10-CM | POA: Diagnosis not present

## 2021-10-09 DIAGNOSIS — E872 Acidosis, unspecified: Secondary | ICD-10-CM | POA: Diagnosis present

## 2021-10-09 DIAGNOSIS — E7800 Pure hypercholesterolemia, unspecified: Secondary | ICD-10-CM | POA: Diagnosis not present

## 2021-10-09 DIAGNOSIS — I472 Ventricular tachycardia, unspecified: Secondary | ICD-10-CM | POA: Diagnosis not present

## 2021-10-09 DIAGNOSIS — Z22322 Carrier or suspected carrier of Methicillin resistant Staphylococcus aureus: Secondary | ICD-10-CM

## 2021-10-09 DIAGNOSIS — E8729 Other acidosis: Secondary | ICD-10-CM

## 2021-10-09 DIAGNOSIS — I130 Hypertensive heart and chronic kidney disease with heart failure and stage 1 through stage 4 chronic kidney disease, or unspecified chronic kidney disease: Secondary | ICD-10-CM | POA: Diagnosis present

## 2021-10-09 DIAGNOSIS — Z7951 Long term (current) use of inhaled steroids: Secondary | ICD-10-CM

## 2021-10-09 DIAGNOSIS — R197 Diarrhea, unspecified: Secondary | ICD-10-CM | POA: Diagnosis not present

## 2021-10-09 DIAGNOSIS — I4510 Unspecified right bundle-branch block: Secondary | ICD-10-CM | POA: Diagnosis present

## 2021-10-09 DIAGNOSIS — I5021 Acute systolic (congestive) heart failure: Secondary | ICD-10-CM | POA: Diagnosis not present

## 2021-10-09 DIAGNOSIS — F0390 Unspecified dementia without behavioral disturbance: Secondary | ICD-10-CM | POA: Diagnosis present

## 2021-10-09 DIAGNOSIS — I5082 Biventricular heart failure: Secondary | ICD-10-CM | POA: Diagnosis present

## 2021-10-09 DIAGNOSIS — R6521 Severe sepsis with septic shock: Secondary | ICD-10-CM | POA: Diagnosis not present

## 2021-10-09 DIAGNOSIS — R0689 Other abnormalities of breathing: Secondary | ICD-10-CM | POA: Diagnosis not present

## 2021-10-09 DIAGNOSIS — D6489 Other specified anemias: Secondary | ICD-10-CM | POA: Diagnosis present

## 2021-10-09 DIAGNOSIS — H3530 Unspecified macular degeneration: Secondary | ICD-10-CM | POA: Diagnosis present

## 2021-10-09 DIAGNOSIS — R64 Cachexia: Secondary | ICD-10-CM | POA: Diagnosis not present

## 2021-10-09 DIAGNOSIS — A0471 Enterocolitis due to Clostridium difficile, recurrent: Secondary | ICD-10-CM | POA: Diagnosis present

## 2021-10-09 DIAGNOSIS — J449 Chronic obstructive pulmonary disease, unspecified: Secondary | ICD-10-CM | POA: Diagnosis present

## 2021-10-09 DIAGNOSIS — Z6821 Body mass index (BMI) 21.0-21.9, adult: Secondary | ICD-10-CM

## 2021-10-09 DIAGNOSIS — G9341 Metabolic encephalopathy: Secondary | ICD-10-CM | POA: Diagnosis not present

## 2021-10-09 DIAGNOSIS — I10 Essential (primary) hypertension: Secondary | ICD-10-CM | POA: Diagnosis not present

## 2021-10-09 DIAGNOSIS — I252 Old myocardial infarction: Secondary | ICD-10-CM

## 2021-10-09 DIAGNOSIS — R4182 Altered mental status, unspecified: Secondary | ICD-10-CM | POA: Diagnosis not present

## 2021-10-09 DIAGNOSIS — R571 Hypovolemic shock: Secondary | ICD-10-CM | POA: Diagnosis not present

## 2021-10-09 DIAGNOSIS — A414 Sepsis due to anaerobes: Secondary | ICD-10-CM | POA: Diagnosis not present

## 2021-10-09 DIAGNOSIS — R579 Shock, unspecified: Secondary | ICD-10-CM | POA: Diagnosis not present

## 2021-10-09 DIAGNOSIS — K2100 Gastro-esophageal reflux disease with esophagitis, without bleeding: Secondary | ICD-10-CM | POA: Diagnosis not present

## 2021-10-09 DIAGNOSIS — J439 Emphysema, unspecified: Secondary | ICD-10-CM | POA: Diagnosis not present

## 2021-10-09 DIAGNOSIS — K7200 Acute and subacute hepatic failure without coma: Secondary | ICD-10-CM | POA: Diagnosis present

## 2021-10-09 DIAGNOSIS — K219 Gastro-esophageal reflux disease without esophagitis: Secondary | ICD-10-CM | POA: Diagnosis present

## 2021-10-09 DIAGNOSIS — N1832 Chronic kidney disease, stage 3b: Secondary | ICD-10-CM | POA: Diagnosis not present

## 2021-10-09 DIAGNOSIS — Z66 Do not resuscitate: Secondary | ICD-10-CM | POA: Diagnosis not present

## 2021-10-09 DIAGNOSIS — Z8249 Family history of ischemic heart disease and other diseases of the circulatory system: Secondary | ICD-10-CM

## 2021-10-09 DIAGNOSIS — Z79891 Long term (current) use of opiate analgesic: Secondary | ICD-10-CM

## 2021-10-09 DIAGNOSIS — E1122 Type 2 diabetes mellitus with diabetic chronic kidney disease: Secondary | ICD-10-CM | POA: Diagnosis present

## 2021-10-09 DIAGNOSIS — Z720 Tobacco use: Secondary | ICD-10-CM

## 2021-10-09 DIAGNOSIS — H353290 Exudative age-related macular degeneration, unspecified eye, stage unspecified: Secondary | ICD-10-CM | POA: Diagnosis not present

## 2021-10-09 DIAGNOSIS — J189 Pneumonia, unspecified organism: Secondary | ICD-10-CM

## 2021-10-09 DIAGNOSIS — E785 Hyperlipidemia, unspecified: Secondary | ICD-10-CM | POA: Diagnosis present

## 2021-10-09 DIAGNOSIS — R509 Fever, unspecified: Secondary | ICD-10-CM | POA: Diagnosis not present

## 2021-10-09 DIAGNOSIS — I959 Hypotension, unspecified: Secondary | ICD-10-CM | POA: Diagnosis not present

## 2021-10-09 DIAGNOSIS — G9340 Encephalopathy, unspecified: Secondary | ICD-10-CM | POA: Diagnosis not present

## 2021-10-09 DIAGNOSIS — N170 Acute kidney failure with tubular necrosis: Secondary | ICD-10-CM | POA: Diagnosis not present

## 2021-10-09 DIAGNOSIS — J690 Pneumonitis due to inhalation of food and vomit: Secondary | ICD-10-CM | POA: Diagnosis present

## 2021-10-09 DIAGNOSIS — N179 Acute kidney failure, unspecified: Secondary | ICD-10-CM | POA: Diagnosis not present

## 2021-10-09 DIAGNOSIS — D3610 Benign neoplasm of peripheral nerves and autonomic nervous system, unspecified: Secondary | ICD-10-CM | POA: Diagnosis present

## 2021-10-09 DIAGNOSIS — R109 Unspecified abdominal pain: Secondary | ICD-10-CM | POA: Diagnosis not present

## 2021-10-09 DIAGNOSIS — J479 Bronchiectasis, uncomplicated: Secondary | ICD-10-CM | POA: Diagnosis not present

## 2021-10-09 DIAGNOSIS — I480 Paroxysmal atrial fibrillation: Secondary | ICD-10-CM | POA: Diagnosis present

## 2021-10-09 DIAGNOSIS — I2510 Atherosclerotic heart disease of native coronary artery without angina pectoris: Secondary | ICD-10-CM | POA: Diagnosis not present

## 2021-10-09 DIAGNOSIS — R9431 Abnormal electrocardiogram [ECG] [EKG]: Secondary | ICD-10-CM

## 2021-10-09 DIAGNOSIS — Z881 Allergy status to other antibiotic agents status: Secondary | ICD-10-CM

## 2021-10-09 DIAGNOSIS — R6520 Severe sepsis without septic shock: Secondary | ICD-10-CM | POA: Diagnosis not present

## 2021-10-09 DIAGNOSIS — Z955 Presence of coronary angioplasty implant and graft: Secondary | ICD-10-CM

## 2021-10-09 DIAGNOSIS — I4891 Unspecified atrial fibrillation: Secondary | ICD-10-CM

## 2021-10-09 DIAGNOSIS — R000 Tachycardia, unspecified: Secondary | ICD-10-CM | POA: Diagnosis not present

## 2021-10-09 DIAGNOSIS — I4729 Other ventricular tachycardia: Secondary | ICD-10-CM

## 2021-10-09 DIAGNOSIS — E876 Hypokalemia: Secondary | ICD-10-CM | POA: Diagnosis present

## 2021-10-09 DIAGNOSIS — I5041 Acute combined systolic (congestive) and diastolic (congestive) heart failure: Secondary | ICD-10-CM | POA: Diagnosis not present

## 2021-10-09 DIAGNOSIS — D496 Neoplasm of unspecified behavior of brain: Secondary | ICD-10-CM | POA: Diagnosis present

## 2021-10-09 DIAGNOSIS — I3139 Other pericardial effusion (noninflammatory): Secondary | ICD-10-CM | POA: Diagnosis not present

## 2021-10-09 DIAGNOSIS — Z9079 Acquired absence of other genital organ(s): Secondary | ICD-10-CM

## 2021-10-09 DIAGNOSIS — Z79899 Other long term (current) drug therapy: Secondary | ICD-10-CM

## 2021-10-09 DIAGNOSIS — I5020 Unspecified systolic (congestive) heart failure: Secondary | ICD-10-CM

## 2021-10-09 DIAGNOSIS — R7989 Other specified abnormal findings of blood chemistry: Secondary | ICD-10-CM | POA: Diagnosis not present

## 2021-10-09 DIAGNOSIS — Z95828 Presence of other vascular implants and grafts: Secondary | ICD-10-CM

## 2021-10-09 DIAGNOSIS — Z833 Family history of diabetes mellitus: Secondary | ICD-10-CM

## 2021-10-09 DIAGNOSIS — J9811 Atelectasis: Secondary | ICD-10-CM | POA: Diagnosis not present

## 2021-10-09 DIAGNOSIS — I517 Cardiomegaly: Secondary | ICD-10-CM | POA: Diagnosis not present

## 2021-10-09 DIAGNOSIS — H9190 Unspecified hearing loss, unspecified ear: Secondary | ICD-10-CM | POA: Diagnosis present

## 2021-10-09 DIAGNOSIS — I5022 Chronic systolic (congestive) heart failure: Secondary | ICD-10-CM

## 2021-10-09 DIAGNOSIS — Z882 Allergy status to sulfonamides status: Secondary | ICD-10-CM

## 2021-10-09 DIAGNOSIS — I25119 Atherosclerotic heart disease of native coronary artery with unspecified angina pectoris: Secondary | ICD-10-CM | POA: Diagnosis present

## 2021-10-09 DIAGNOSIS — E1140 Type 2 diabetes mellitus with diabetic neuropathy, unspecified: Secondary | ICD-10-CM | POA: Diagnosis present

## 2021-10-09 DIAGNOSIS — Z6823 Body mass index (BMI) 23.0-23.9, adult: Secondary | ICD-10-CM

## 2021-10-09 DIAGNOSIS — D692 Other nonthrombocytopenic purpura: Secondary | ICD-10-CM | POA: Diagnosis not present

## 2021-10-09 DIAGNOSIS — Z888 Allergy status to other drugs, medicaments and biological substances status: Secondary | ICD-10-CM

## 2021-10-09 LAB — I-STAT CHEM 8, ED
BUN: 36 mg/dL — ABNORMAL HIGH (ref 8–23)
Calcium, Ion: 1.04 mmol/L — ABNORMAL LOW (ref 1.15–1.40)
Chloride: 103 mmol/L (ref 98–111)
Creatinine, Ser: 1.2 mg/dL (ref 0.61–1.24)
Glucose, Bld: 101 mg/dL — ABNORMAL HIGH (ref 70–99)
HCT: 32 % — ABNORMAL LOW (ref 39.0–52.0)
Hemoglobin: 10.9 g/dL — ABNORMAL LOW (ref 13.0–17.0)
Potassium: 5.6 mmol/L — ABNORMAL HIGH (ref 3.5–5.1)
Sodium: 133 mmol/L — ABNORMAL LOW (ref 135–145)
TCO2: 25 mmol/L (ref 22–32)

## 2021-10-09 LAB — I-STAT VENOUS BLOOD GAS, ED
Acid-Base Excess: 1 mmol/L (ref 0.0–2.0)
Bicarbonate: 23.6 mmol/L (ref 20.0–28.0)
Calcium, Ion: 1.05 mmol/L — ABNORMAL LOW (ref 1.15–1.40)
HCT: 32 % — ABNORMAL LOW (ref 39.0–52.0)
Hemoglobin: 10.9 g/dL — ABNORMAL LOW (ref 13.0–17.0)
O2 Saturation: 99 %
Potassium: 5.6 mmol/L — ABNORMAL HIGH (ref 3.5–5.1)
Sodium: 133 mmol/L — ABNORMAL LOW (ref 135–145)
TCO2: 25 mmol/L (ref 22–32)
pCO2, Ven: 31.3 mmHg — ABNORMAL LOW (ref 44–60)
pH, Ven: 7.486 — ABNORMAL HIGH (ref 7.25–7.43)
pO2, Ven: 137 mmHg — ABNORMAL HIGH (ref 32–45)

## 2021-10-09 LAB — CBC WITH DIFFERENTIAL/PLATELET
Abs Immature Granulocytes: 0.06 10*3/uL (ref 0.00–0.07)
Basophils Absolute: 0 10*3/uL (ref 0.0–0.1)
Basophils Relative: 0 %
Eosinophils Absolute: 0 10*3/uL (ref 0.0–0.5)
Eosinophils Relative: 0 %
HCT: 30.6 % — ABNORMAL LOW (ref 39.0–52.0)
Hemoglobin: 9.5 g/dL — ABNORMAL LOW (ref 13.0–17.0)
Immature Granulocytes: 1 %
Lymphocytes Relative: 17 %
Lymphs Abs: 1.5 10*3/uL (ref 0.7–4.0)
MCH: 25.7 pg — ABNORMAL LOW (ref 26.0–34.0)
MCHC: 31 g/dL (ref 30.0–36.0)
MCV: 82.7 fL (ref 80.0–100.0)
Monocytes Absolute: 0.5 10*3/uL (ref 0.1–1.0)
Monocytes Relative: 5 %
Neutro Abs: 6.8 10*3/uL (ref 1.7–7.7)
Neutrophils Relative %: 77 %
Platelets: 233 10*3/uL (ref 150–400)
RBC: 3.7 MIL/uL — ABNORMAL LOW (ref 4.22–5.81)
RDW: 17 % — ABNORMAL HIGH (ref 11.5–15.5)
WBC: 8.9 10*3/uL (ref 4.0–10.5)
nRBC: 0 % (ref 0.0–0.2)

## 2021-10-09 LAB — COMPREHENSIVE METABOLIC PANEL
ALT: 101 U/L — ABNORMAL HIGH (ref 0–44)
AST: 45 U/L — ABNORMAL HIGH (ref 15–41)
Albumin: 2.7 g/dL — ABNORMAL LOW (ref 3.5–5.0)
Alkaline Phosphatase: 70 U/L (ref 38–126)
Anion gap: 9 (ref 5–15)
BUN: 24 mg/dL — ABNORMAL HIGH (ref 8–23)
CO2: 20 mmol/L — ABNORMAL LOW (ref 22–32)
Calcium: 8.2 mg/dL — ABNORMAL LOW (ref 8.9–10.3)
Chloride: 105 mmol/L (ref 98–111)
Creatinine, Ser: 1.32 mg/dL — ABNORMAL HIGH (ref 0.61–1.24)
GFR, Estimated: 52 mL/min — ABNORMAL LOW (ref >60)
Glucose, Bld: 102 mg/dL — ABNORMAL HIGH (ref 70–99)
Potassium: 3.4 mmol/L — ABNORMAL LOW (ref 3.5–5.1)
Sodium: 134 mmol/L — ABNORMAL LOW (ref 135–145)
Total Bilirubin: 0.6 mg/dL (ref 0.3–1.2)
Total Protein: 5.3 g/dL — ABNORMAL LOW (ref 6.5–8.1)

## 2021-10-09 LAB — C DIFFICILE QUICK SCREEN W PCR REFLEX
C Diff antigen: POSITIVE — AB
C Diff interpretation: DETECTED
C Diff toxin: POSITIVE — AB

## 2021-10-09 LAB — LACTIC ACID, PLASMA: Lactic Acid, Venous: 2.2 mmol/L (ref 0.5–1.9)

## 2021-10-09 LAB — APTT: aPTT: 30 seconds (ref 24–36)

## 2021-10-09 LAB — MAGNESIUM: Magnesium: 1.5 mg/dL — ABNORMAL LOW (ref 1.7–2.4)

## 2021-10-09 LAB — ACETAMINOPHEN LEVEL: Acetaminophen (Tylenol), Serum: 12 ug/mL (ref 10–30)

## 2021-10-09 LAB — ETHANOL: Alcohol, Ethyl (B): <10 mg/dL (ref <10)

## 2021-10-09 LAB — SALICYLATE LEVEL: Salicylate Lvl: <7 mg/dL — ABNORMAL LOW (ref 7.0–30.0)

## 2021-10-09 LAB — PROTIME-INR
INR: 1.1 (ref 0.8–1.2)
Prothrombin Time: 14 seconds (ref 11.4–15.2)

## 2021-10-09 LAB — AMMONIA: Ammonia: 45 umol/L — ABNORMAL HIGH (ref 9–35)

## 2021-10-09 MED ORDER — METRONIDAZOLE 500 MG/100ML IV SOLN
500.0000 mg | Freq: Once | INTRAVENOUS | Status: AC
  Administered 2021-10-09: 500 mg via INTRAVENOUS
  Filled 2021-10-09: qty 100

## 2021-10-09 MED ORDER — SODIUM CHLORIDE 0.9 % IV BOLUS
1000.0000 mL | Freq: Once | INTRAVENOUS | Status: AC
  Administered 2021-10-09: 1000 mL via INTRAVENOUS

## 2021-10-09 MED ORDER — VANCOMYCIN HCL IN DEXTROSE 1-5 GM/200ML-% IV SOLN: 1000.0000 mg | Freq: Once | INTRAVENOUS | Status: DC

## 2021-10-09 MED ORDER — ACETAMINOPHEN 650 MG RE SUPP
650.0000 mg | Freq: Once | RECTAL | Status: AC
Start: 2021-10-09 — End: 2021-10-09
  Administered 2021-10-09: 650 mg via RECTAL
  Filled 2021-10-09: qty 1

## 2021-10-09 MED ORDER — VANCOMYCIN HCL 1250 MG/250ML IV SOLN
1250.0000 mg | Freq: Once | INTRAVENOUS | Status: AC
  Administered 2021-10-09: 1250 mg via INTRAVENOUS
  Filled 2021-10-09: qty 250

## 2021-10-09 MED ORDER — KETOROLAC TROMETHAMINE 15 MG/ML IJ SOLN
15.0000 mg | Freq: Once | INTRAMUSCULAR | Status: AC
Start: 2021-10-09 — End: 2021-10-10
  Administered 2021-10-10: 15 mg via INTRAVENOUS
  Filled 2021-10-09: qty 1

## 2021-10-09 MED ORDER — VANCOMYCIN HCL 750 MG/150ML IV SOLN: 750.0000 mg | INTRAVENOUS | Status: DC

## 2021-10-09 MED ORDER — SODIUM CHLORIDE 0.9 % IV SOLN: 2.0000 g | Freq: Two times a day (BID) | INTRAVENOUS | Status: DC

## 2021-10-09 MED ORDER — SODIUM CHLORIDE 0.9 % IV SOLN: INTRAVENOUS | Status: DC

## 2021-10-09 MED ORDER — MAGNESIUM SULFATE 2 GM/50ML IV SOLN
2.0000 g | Freq: Once | INTRAVENOUS | Status: AC
  Administered 2021-10-10: 2 g via INTRAVENOUS
  Filled 2021-10-09: qty 50

## 2021-10-09 MED ORDER — SODIUM CHLORIDE 0.9 % IV SOLN
2.0000 g | Freq: Once | INTRAVENOUS | Status: AC
  Administered 2021-10-09: 2 g via INTRAVENOUS
  Filled 2021-10-09: qty 2

## 2021-10-09 NOTE — Assessment & Plan Note (Signed)
Replace Monitor mag and electrolytes

## 2021-10-09 NOTE — Assessment & Plan Note (Signed)
Secondary to sepsis. Monitor

## 2021-10-09 NOTE — Assessment & Plan Note (Signed)
Monitor BP 

## 2021-10-09 NOTE — ED Triage Notes (Signed)
Pt BIB [facility] after his wife called EMS for a fever and diarrhea. Family reports two episodes of diarrhea aroun 1200 and 1800 with the pt becoming altered around 1200. Patient A&O x 1 at this time. Hx of abx use since 09-15-21. HR 110-120, afib.

## 2021-10-09 NOTE — Assessment & Plan Note (Signed)
Avoid medications which can further prolong QT interval

## 2021-10-09 NOTE — ED Provider Notes (Signed)
[name]  History   Chief Complaint  Patient presents with   Altered Mental Status   Leon Estes is a 88 y.o. male w/ h/o recent diagnosis of schwannoma, recurrent [name], CAD s/p PCI, COPD, CKD 3, HTN, HLD, BPH, who p/w EMS from home for AMS, fever, diarrhea x 1d.   The history is provided by the EMS personnel.  Illness Location:  AMS, fever, diarrhea Quality:  Patient unable to characterize, family unable to be reached for collateral Severity:  Unable to specify Onset quality:  Unable to specify Duration:  1 day Timing:  Unable to specify Progression:  Unable to specify Chronicity:  Recurrent Context:  See MDM   Past Medical History:  Diagnosis Date   Asthma    BPH (benign prostatic hypertrophy)    Cataract    Chronic renal disease, stage III ([place])    Chronic sinusitis    COPD (chronic obstructive pulmonary disease) ([place])    Coronary artery disease    2 stents   Gall stones    Hearing loss    DOES NOT WEAR HEARING AIDS   High cholesterol    Hypertension    Macular degeneration    right eye    Social History   Tobacco Use   Smoking status: Former    Types: Cigarettes    Quit date: 08/13/2005    Years since quitting: 16.1    Passive exposure: Past   Smokeless tobacco: Current    Types: Chew   Tobacco comments:    discussed stopping this (only once in a while)  Vaping Use   Vaping Use: Never used  Substance Use Topics   Alcohol use: No   Drug use: No     Family History  Problem Relation Age of Onset   Diabetes Father    Heart disease Brother     Review of Systems  Unable to perform ROS: Mental status change   Physical Exam   Today's Vitals   10/09/21 1907 10/09/21 1917 10/09/21 1927 10/09/21 2045  BP:  (!) 129/46 (!) 129/46 (!) 163/151  Pulse:  (!) 52 (!) 107 (!) 103  Resp:   (!) 32 20  Temp:   (!) 100.4 F (38 C)   TempSrc:   Axillary   SpO2: 94% 93% 92% 97%      Physical Exam Vitals and nursing note reviewed.  Constitutional:      General: He is not in acute distress.    Appearance: He is well-developed. He is obese. He is ill-appearing. He is not diaphoretic.     Comments: Sleepy, awakens to loud speech and pain  HENT:     Head: Normocephalic and atraumatic.     Nose: Nose normal. No congestion.     Mouth/Throat:     Mouth: Mucous membranes are dry.     Pharynx: Oropharynx is clear. No oropharyngeal exudate.  Eyes:     Extraocular Movements: Extraocular movements intact.     Pupils: Pupils are equal, round, and reactive to light.  Cardiovascular:     Rate and Rhythm: Regular rhythm. Tachycardia present.     Pulses: Normal pulses.     Heart sounds: Normal heart sounds. No murmur heard. Pulmonary:     Effort: Pulmonary effort is normal. No respiratory distress.     Breath sounds: Normal breath sounds. No stridor. No wheezing, rhonchi or rales.  Abdominal:     General: There is no distension.  Palpations: Abdomen is soft.     Tenderness: There is no abdominal tenderness. There is no guarding or rebound.  Musculoskeletal:        General: No swelling or deformity.     Cervical back: Normal range of motion and neck supple. No rigidity or tenderness.     Right lower leg: No edema.     Left lower leg: No edema.  Skin:    General: Skin is warm and dry.     Capillary Refill: Capillary refill takes less than 2 seconds.     Findings: No rash.  Neurological:     Mental Status: He is disoriented.     GCS: GCS eye subscore is 3. GCS verbal subscore is 4. GCS motor subscore is 5.     Cranial Nerves: Cranial nerves 2-12 are intact.     Sensory: Sensation is intact.     Motor: Motor function is intact.     Comments: Gross neurologic examination without focal deficit, limited by patient's ability to follow commands/participation with neurologic examination.  Psychiatric:     Comments: Unable to assess as patient presented altered.  Does not  appear to be responding to internal stimuli.    ED Course  Procedures Medical Decision Making:  [name] is a 88 y.o. male w/ h/o recent diagnosis of schwannoma, recurrent [name], CAD s/p PCI, COPD, CKD 3, HTN, HLD, BPH, who p/w EMS from home for AMS, fever, diarrhea x 1d.   Recent admission from 07/24/21 - 07/29/21 for acute metabolic encephalopathy in the setting of diarrhea (despite vancomycin for presumed [name]).  History obtained from EMS.  EMS reports they were called out for fever, diarrhea, and altered mental status.  They report that the wife stated the patient had 2 episodes of diarrhea today.  Around noon, the patient had first episode of nonbloody diarrhea and was noted to be altered (unable to elaborate).  At 1800, the patient had a second episode of diarrhea and was noted to be febrile prompting family to call EMS.  Multiple attempts made by MD to contact Daine Floras (spouse) to obtain collateral information, phone calls were unanswered.  On arrival, GCS 12.  Opens eyes to speech, confused, localizes to pain. Noted to be febrile, will administer rectal Tylenol. Noted to be tachycardic, will administer IVF fluid bolus followed by maintenance IVF. Tachypneic, not hypoxic, will obtain labs and CXR. Given tachycardia, febrile, reported AMS will initiate sepsis (of unknown origin) protocol to include ABX and IVF Magnesium noted to be low, will order IV repletion  ER provider interpretation of Imaging / Radiology:  CT Head: No acute intracranial process, stable left CP angle mass. CXR: No definite acute cardiopulmonary abnormality.  No focal consolidation.  Aortic atherosclerosis.  ER provider interpretation of EKG:  Sinus tachycardia, multiform PVCs, RBBB, ST depression V4-V6  ER provider interpretation of Labs:  VBG pH 7.486, PCO2 31.3 CBC WBC 8.9, Hgb 9.5 (9.4 on 1/31), HCT 30.6 CMP Na 134, K 3.4, CO2 20, BG 102, BUN 24, Cr 1.32, AST  45, ALT 101, AG 9 Mag: 1.5 TSH pending at time of handoff Ammonia 45 Lactic acid 2.2 Coags unremarkable Acetaminophen level 12 Salicylate level unremarkable Ethanol level <10 UA needs to be collected UDS needs to be collected C. Difficile pending at time of handoff GI pathogen panel pending at the time of handoff Bcx pending  Key medications administered in the ER:  Medications  0.9 %  sodium chloride infusion (  Intravenous New Bag/Given 10/09/21 2211)  vancomycin (VANCOREADY) IVPB 1250 mg/250 mL (1,250 mg Intravenous New Bag/Given 10/09/21 2206)  ceFEPIme (MAXIPIME) 2 g in sodium chloride 0.9 % 100 mL IVPB (has no administration in time range)  vancomycin (VANCOREADY) IVPB 750 mg/150 mL (has no administration in time range)  magnesium sulfate IVPB 2 g 50 mL (has no administration in time range)  ketorolac (TORADOL) 15 MG/ML injection 15 mg (has no administration in time range)  acetaminophen (TYLENOL) suppository 650 mg (650 mg Rectal Given 10/09/21 2023)  sodium chloride 0.9 % bolus 1,000 mL (0 mLs Intravenous Stopped 10/09/21 2038)  ceFEPIme (MAXIPIME) 2 g in sodium chloride 0.9 % 100 mL IVPB (0 g Intravenous Stopped 10/09/21 2149)  metroNIDAZOLE (FLAGYL) IVPB 500 mg (0 mg Intravenous Stopped 10/09/21 2149)    Diagnoses considered: Most likely sepsis of unknown origin.  CXR without PNA.  UA pending. Ddx includes alcohol, electrolyte abnormalities (Na/Ca), endocrine (myxedema coma, thyroid storm), encephalopathy (hepatic, HTN, heat, wernicke), infection (encephalitis, sepsis), toxidromes of overdose or withdrawal, hypoxia, uremia, trauma (hemorrhagic shock, TBI), hypothermia, hypoglycemia, psychogenic, seizure, stroke, or intracranial bleed. Unlikely dementia as acute in nature and not progressively degenerative.   Consulted: None indicated thus far  On reassessment at 2245, patient with improved mental status (will interact more), GCS 15, slightly tachypneic, not hypoxic, remains  febrile despite Tylenol-we will give a dose of Toradol for fever.  Spoke with inpatient team regarding admission for sepsis of unknown origin at this time.  They requested CT chest without contrast to evaluate for any pneumonia.  They plan for admission.  Patient seen in conjunction with Dr. Dawna Part medical dictation software was used in the creation of this note.   Electronically signed by: Wynetta Fines, MD on 10/09/2021 at 8:24 PM  Clinical Impression:  1. Altered mental status, unspecified altered mental status type   2. Diarrhea, unspecified type   3. Fever, unspecified fever cause     Dispo: Imagene Riches, MD 10/09/21 2310    Carmin Muskrat, MD 10/09/21 [phone]

## 2021-10-09 NOTE — Assessment & Plan Note (Signed)
Duonebs Q 6 hour as needed Pulmicort neb bid Supplemental oxygen as needed to keep O2 sat 92-96%

## 2021-10-09 NOTE — ED Notes (Signed)
Patient transported to CT 

## 2021-10-09 NOTE — Progress Notes (Signed)
Pharmacy Antibiotic Note  Leon Estes is a 88 y.o. male admitted on 10/09/2021 presenting with diarrhea/fever and AMS, concern for sepsis.  Pharmacy has been consulted for cefepime and vancomycin dosing.  Plan: Vancomycin 1250 mg IV x 1, then 750 mg IV q 24h (eAUC 486, Goal AUC 400-550, SCr 1.32) Cefepime 2g IV q 12h Monitor renal function, Cx and clinical progression to narrow Vancomycin levels as indicated     Temp (24hrs), Avg:100.4 F (38 C), Min:100.4 F (38 C), Max:100.4 F (38 C)  Recent Labs  Lab 10/09/21 1931 10/09/21 1949  WBC 8.9  --   CREATININE 1.32* 1.20    Estimated Creatinine Clearance: 38.5 mL/min (by C-G formula based on SCr of 1.2 mg/dL).    Allergies  Allergen Reactions   Sulfa Antibiotics Rash   Bacitracin-Polymyxin B     Other reaction(s): rash, swelling   Benzalkonium Chloride     "Benzalkonium chloride is a quaternary ammonium antiseptic and disinfectant with actions and uses similar to those of other cationic surfactants. It is also used as an antimicrobial preservative for pharmaceutical products."   Neosporin [Neomycin-Bacitracin Zn-Polymyx]    Sulfamethoxazole-Trimethoprim     Other reaction(s): rash   Augmentin [Amoxicillin-Pot Clavulanate] Rash   Terbinafine Rash   Terbinafine And Related Rash    Bertis Ruddy, PharmD Clinical Pharmacist ED Pharmacist Phone # [phone] 10/09/2021 8:19 PM

## 2021-10-09 NOTE — Assessment & Plan Note (Signed)
Treat with Dificid. Consult ID in am May need GI for stool transplant treatment

## 2021-10-09 NOTE — Sepsis Progress Note (Signed)
Following per sepsis protocol   

## 2021-10-09 NOTE — Assessment & Plan Note (Signed)
Leon Estes is admitted to [facility] with recurrent C. Difficile causing sepsis. He was given broad spectrum antibiotic in ER which is stopped since came back with positive C. Difficle.  Place NG tube and give Dificid per tube.  Consult ID in am  IVF hydration.

## 2021-10-10 ENCOUNTER — Inpatient Hospital Stay (HOSPITAL_COMMUNITY): Payer: PPO

## 2021-10-10 DIAGNOSIS — R579 Shock, unspecified: Secondary | ICD-10-CM | POA: Diagnosis not present

## 2021-10-10 DIAGNOSIS — A419 Sepsis, unspecified organism: Secondary | ICD-10-CM

## 2021-10-10 DIAGNOSIS — G9340 Encephalopathy, unspecified: Secondary | ICD-10-CM

## 2021-10-10 DIAGNOSIS — R6521 Severe sepsis with septic shock: Secondary | ICD-10-CM

## 2021-10-10 DIAGNOSIS — R509 Fever, unspecified: Secondary | ICD-10-CM | POA: Diagnosis not present

## 2021-10-10 DIAGNOSIS — R6520 Severe sepsis without septic shock: Secondary | ICD-10-CM

## 2021-10-10 DIAGNOSIS — R4182 Altered mental status, unspecified: Secondary | ICD-10-CM | POA: Insufficient documentation

## 2021-10-10 HISTORY — DX: Severe sepsis with septic shock: R65.21

## 2021-10-10 HISTORY — DX: Sepsis, unspecified organism: A41.9

## 2021-10-10 LAB — RAPID URINE DRUG SCREEN, HOSP PERFORMED
Amphetamines: NOT DETECTED
Barbiturates: NOT DETECTED
Benzodiazepines: NOT DETECTED
Cocaine: NOT DETECTED
Opiates: NOT DETECTED
Tetrahydrocannabinol: NOT DETECTED

## 2021-10-10 LAB — URINALYSIS, ROUTINE W REFLEX MICROSCOPIC
Bacteria, UA: NONE SEEN
Bilirubin Urine: NEGATIVE
Glucose, UA: NEGATIVE mg/dL
Hgb urine dipstick: NEGATIVE
Ketones, ur: NEGATIVE mg/dL
Leukocytes,Ua: NEGATIVE
Nitrite: NEGATIVE
Protein, ur: 100 mg/dL — AB
Specific Gravity, Urine: 1.019 (ref 1.005–1.030)
pH: 5 (ref 5.0–8.0)

## 2021-10-10 LAB — LACTIC ACID, PLASMA
Lactic Acid, Venous: 3.8 mmol/L (ref 0.5–1.9)
Lactic Acid, Venous: 3.4 mmol/L (ref 0.5–1.9)

## 2021-10-10 LAB — COMPREHENSIVE METABOLIC PANEL
ALT: 73 U/L — ABNORMAL HIGH (ref 0–44)
AST: 40 U/L (ref 15–41)
Albumin: 2 g/dL — ABNORMAL LOW (ref 3.5–5.0)
Alkaline Phosphatase: 49 U/L (ref 38–126)
Anion gap: 12 (ref 5–15)
BUN: 28 mg/dL — ABNORMAL HIGH (ref 8–23)
CO2: 17 mmol/L — ABNORMAL LOW (ref 22–32)
Calcium: 7.5 mg/dL — ABNORMAL LOW (ref 8.9–10.3)
Chloride: 108 mmol/L (ref 98–111)
Creatinine, Ser: 1.72 mg/dL — ABNORMAL HIGH (ref 0.61–1.24)
GFR, Estimated: 38 mL/min — ABNORMAL LOW (ref >60)
Glucose, Bld: 75 mg/dL (ref 70–99)
Potassium: 3.4 mmol/L — ABNORMAL LOW (ref 3.5–5.1)
Sodium: 137 mmol/L (ref 135–145)
Total Bilirubin: 0.9 mg/dL (ref 0.3–1.2)
Total Protein: 4.4 g/dL — ABNORMAL LOW (ref 6.5–8.1)

## 2021-10-10 LAB — ECHOCARDIOGRAM COMPLETE
AR max vel: 1.09 cm2
AV Area VTI: 1.13 cm2
AV Area mean vel: 1.06 cm2
AV Mean grad: 3 mmHg
AV Peak grad: 6 mmHg
Ao pk vel: 1.22 m/s
Calc EF: 31.6 %
MV M vel: 2.63 m/s
MV Peak grad: 27.7 mmHg
S' Lateral: 2.7 cm
Single Plane A2C EF: 31.3 %
Single Plane A4C EF: 34.9 %
Weight: 2373.91 oz

## 2021-10-10 LAB — GLUCOSE, CAPILLARY
Glucose-Capillary: 68 mg/dL — ABNORMAL LOW (ref 70–99)
Glucose-Capillary: 88 mg/dL (ref 70–99)
Glucose-Capillary: 68 mg/dL — ABNORMAL LOW (ref 70–99)
Glucose-Capillary: 73 mg/dL (ref 70–99)
Glucose-Capillary: 79 mg/dL (ref 70–99)
Glucose-Capillary: 81 mg/dL (ref 70–99)
Glucose-Capillary: 100 mg/dL — ABNORMAL HIGH (ref 70–99)
Glucose-Capillary: 61 mg/dL — ABNORMAL LOW (ref 70–99)

## 2021-10-10 LAB — CBC
HCT: 28 % — ABNORMAL LOW (ref 39.0–52.0)
Hemoglobin: 8.7 g/dL — ABNORMAL LOW (ref 13.0–17.0)
MCH: 26.4 pg (ref 26.0–34.0)
MCHC: 31.1 g/dL (ref 30.0–36.0)
MCV: 84.8 fL (ref 80.0–100.0)
Platelets: 197 10*3/uL (ref 150–400)
RBC: 3.3 MIL/uL — ABNORMAL LOW (ref 4.22–5.81)
RDW: 17.2 % — ABNORMAL HIGH (ref 11.5–15.5)
WBC: 8.5 10*3/uL (ref 4.0–10.5)
nRBC: 0.2 % (ref 0.0–0.2)

## 2021-10-10 LAB — MRSA NEXT GEN BY PCR, NASAL: MRSA by PCR Next Gen: DETECTED — AB

## 2021-10-10 LAB — PHOSPHORUS: Phosphorus: 3.5 mg/dL (ref 2.5–4.6)

## 2021-10-10 LAB — CBG MONITORING, ED: Glucose-Capillary: 85 mg/dL (ref 70–99)

## 2021-10-10 LAB — MAGNESIUM: Magnesium: 1.5 mg/dL — ABNORMAL LOW (ref 1.7–2.4)

## 2021-10-10 MED ORDER — VANCOMYCIN HCL 500 MG IV SOLR
500.0000 mg | Freq: Four times a day (QID) | Status: DC
  Filled 2021-10-10 (×2): qty 10

## 2021-10-10 MED ORDER — LACTATED RINGERS IV BOLUS
500.0000 mL | Freq: Once | INTRAVENOUS | Status: AC
  Administered 2021-10-10: 500 mL via INTRAVENOUS

## 2021-10-10 MED ORDER — CHLORHEXIDINE GLUCONATE CLOTH 2 % EX PADS
6.0000 | MEDICATED_PAD | Freq: Every day | CUTANEOUS | Status: DC
  Administered 2021-10-10: 6 via TOPICAL

## 2021-10-10 MED ORDER — SODIUM CHLORIDE 0.9 % IV SOLN
250.0000 mL | INTRAVENOUS | Status: DC
  Administered 2021-10-10: 250 mL via INTRAVENOUS

## 2021-10-10 MED ORDER — VANCOMYCIN HCL 250 MG PO CAPS
500.0000 mg | ORAL_CAPSULE | Freq: Four times a day (QID) | ORAL | Status: DC
  Filled 2021-10-10 (×2): qty 2

## 2021-10-10 MED ORDER — DEXTROSE 50 % IV SOLN
12.5000 g | INTRAVENOUS | Status: AC
  Administered 2021-10-10: 12.5 g via INTRAVENOUS

## 2021-10-10 MED ORDER — NOREPINEPHRINE 4 MG/250ML-% IV SOLN: 2.0000 ug/min | INTRAVENOUS | Status: DC

## 2021-10-10 MED ORDER — ENOXAPARIN SODIUM 40 MG/0.4ML IJ SOSY: 40.0000 mg | PREFILLED_SYRINGE | INTRAMUSCULAR | Status: DC

## 2021-10-10 MED ORDER — CHLORHEXIDINE GLUCONATE CLOTH 2 % EX PADS
6.0000 | MEDICATED_PAD | Freq: Every day | CUTANEOUS | Status: AC
  Administered 2021-10-11 – 2021-10-14 (×4): 6 via TOPICAL

## 2021-10-10 MED ORDER — FIDAXOMICIN 200 MG PO TABS
200.0000 mg | ORAL_TABLET | Freq: Two times a day (BID) | ORAL | Status: DC
  Filled 2021-10-10: qty 1

## 2021-10-10 MED ORDER — LACTATED RINGERS IV BOLUS
1000.0000 mL | Freq: Once | INTRAVENOUS | Status: AC
Start: 2021-10-10 — End: 2021-10-10
  Administered 2021-10-10: 1000 mL via INTRAVENOUS

## 2021-10-10 MED ORDER — METRONIDAZOLE 500 MG/100ML IV SOLN
500.0000 mg | Freq: Three times a day (TID) | INTRAVENOUS | Status: DC
  Administered 2021-10-10 – 2021-10-14 (×14): 500 mg via INTRAVENOUS
  Filled 2021-10-10 (×15): qty 100

## 2021-10-10 MED ORDER — SODIUM CHLORIDE 0.9 % IV SOLN
2.0000 g | INTRAVENOUS | Status: DC
  Administered 2021-10-10 – 2021-10-13 (×4): 2 g via INTRAVENOUS
  Filled 2021-10-10 (×4): qty 20

## 2021-10-10 MED ORDER — ACETAMINOPHEN 325 MG PO TABS
650.0000 mg | ORAL_TABLET | Freq: Four times a day (QID) | ORAL | Status: DC | PRN
  Administered 2021-10-14 – 2021-10-16 (×3): 650 mg via ORAL
  Filled 2021-10-10 (×4): qty 2

## 2021-10-10 MED ORDER — POTASSIUM CHLORIDE 10 MEQ/100ML IV SOLN
10.0000 meq | INTRAVENOUS | Status: AC
  Administered 2021-10-10 (×4): 10 meq via INTRAVENOUS
  Filled 2021-10-10 (×4): qty 100

## 2021-10-10 MED ORDER — NOREPINEPHRINE 4 MG/250ML-% IV SOLN: 0.0000 ug/min | INTRAVENOUS | Status: DC

## 2021-10-10 MED ORDER — DEXTROSE-NACL 5-0.9 % IV SOLN: INTRAVENOUS | Status: DC

## 2021-10-10 MED ORDER — NITROGLYCERIN 2 % TD OINT
1.0000 [in_us] | TOPICAL_OINTMENT | Freq: Three times a day (TID) | TRANSDERMAL | Status: DC
  Filled 2021-10-10: qty 30

## 2021-10-10 MED ORDER — ALBUMIN HUMAN 5 % IV SOLN
25.0000 g | Freq: Once | INTRAVENOUS | Status: AC
  Administered 2021-10-10: 25 g via INTRAVENOUS
  Filled 2021-10-10: qty 500

## 2021-10-10 MED ORDER — IOHEXOL 350 MG/ML SOLN
80.0000 mL | Freq: Once | INTRAVENOUS | Status: AC | PRN
  Administered 2021-10-10: 80 mL via INTRAVENOUS

## 2021-10-10 MED ORDER — STERILE WATER FOR INJECTION IJ SOLN
INTRAMUSCULAR | Status: AC
  Filled 2021-10-10: qty 10

## 2021-10-10 MED ORDER — VANCOMYCIN HCL 500 MG IV SOLR
500.0000 mg | Freq: Four times a day (QID) | Status: DC
  Administered 2021-10-10 (×2): 500 mg via RECTAL
  Filled 2021-10-10 (×5): qty 10

## 2021-10-10 MED ORDER — MAGNESIUM SULFATE 4 GM/100ML IV SOLN
4.0000 g | Freq: Once | INTRAVENOUS | Status: AC
  Administered 2021-10-10: 4 g via INTRAVENOUS
  Filled 2021-10-10 (×2): qty 100

## 2021-10-10 MED ORDER — MUPIROCIN 2 % EX OINT
1.0000 "application " | TOPICAL_OINTMENT | Freq: Two times a day (BID) | CUTANEOUS | Status: AC
  Administered 2021-10-10 – 2021-10-14 (×10): 1 via NASAL
  Filled 2021-10-10 (×2): qty 22

## 2021-10-10 MED ORDER — DEXTROSE 50 % IV SOLN
INTRAVENOUS | Status: AC
  Filled 2021-10-10: qty 50

## 2021-10-10 MED ORDER — MIDAZOLAM HCL 2 MG/2ML IJ SOLN
INTRAMUSCULAR | Status: AC
  Administered 2021-10-10: 1 mg via INTRAVENOUS
  Filled 2021-10-10: qty 2

## 2021-10-10 MED ORDER — VANCOMYCIN HCL 500 MG IV SOLR
500.0000 mg | Freq: Once | Status: AC
  Administered 2021-10-10: 500 mg via RECTAL
  Filled 2021-10-10: qty 10

## 2021-10-10 MED ORDER — PHENTOLAMINE MESYLATE 5 MG IJ SOLR
5.0000 mg | Freq: Once | INTRAMUSCULAR | Status: AC
  Administered 2021-10-10: 5 mg via SUBCUTANEOUS
  Filled 2021-10-10: qty 5

## 2021-10-10 MED ORDER — NOREPINEPHRINE 4 MG/250ML-% IV SOLN
2.0000 ug/min | INTRAVENOUS | Status: DC
  Administered 2021-10-10: 2 ug/min via INTRAVENOUS
  Administered 2021-10-10: 8 ug/min via INTRAVENOUS
  Filled 2021-10-10 (×3): qty 250

## 2021-10-10 MED ORDER — LACTATED RINGERS IV SOLN
INTRAVENOUS | Status: DC
  Filled 2021-10-10 (×4): qty 1000

## 2021-10-10 MED ORDER — LACTATED RINGERS IV SOLN: INTRAVENOUS | Status: DC

## 2021-10-10 MED ORDER — FLUTICASONE FUROATE-VILANTEROL 200-25 MCG/ACT IN AEPB
1.0000 | INHALATION_SPRAY | Freq: Every day | RESPIRATORY_TRACT | Status: DC
Start: 2021-10-10 — End: 2021-10-17
  Administered 2021-10-11 – 2021-10-17 (×7): 1 via RESPIRATORY_TRACT
  Filled 2021-10-10: qty 28

## 2021-10-10 MED ORDER — HEPARIN SODIUM (PORCINE) 5000 UNIT/ML IJ SOLN
5000.0000 [IU] | Freq: Three times a day (TID) | INTRAMUSCULAR | Status: DC
  Administered 2021-10-10 – 2021-10-17 (×20): 5000 [IU] via SUBCUTANEOUS
  Filled 2021-10-10 (×20): qty 1

## 2021-10-10 MED ORDER — ACETAMINOPHEN 650 MG RE SUPP: 650.0000 mg | Freq: Four times a day (QID) | RECTAL | Status: DC | PRN

## 2021-10-10 MED ORDER — LACTATED RINGERS IV BOLUS
1000.0000 mL | Freq: Once | INTRAVENOUS | Status: AC
  Administered 2021-10-10: 1000 mL via INTRAVENOUS

## 2021-10-10 MED ORDER — ORAL CARE MOUTH RINSE
15.0000 mL | Freq: Two times a day (BID) | OROMUCOSAL | Status: DC
  Administered 2021-10-10 – 2021-10-17 (×15): 15 mL via OROMUCOSAL

## 2021-10-10 NOTE — ED Notes (Signed)
MD Hoffman in [place] notified of b/p 21-11'N systolic on 35APO of Levo. Per Heber Keyser, RN to do 2 L bolus of LR.

## 2021-10-10 NOTE — Progress Notes (Signed)
eLink Physician-Brief Progress Note Patient Name: Leon Estes DOB: 03-02-1931 MRN: [id number]   Date of Service  10/10/2021  HPI/Events of Note   88 year old male with PMH CKD III, COPD, CAD, and HTN. He presented 2/27 with complaints of AMS, fever, and diarrhea x  24 hours. Found to be febrile . Started on antibiotics and given volume. Stool testing positive for c. Difficile antigen and toxin.  Transferred to ICU due to requirement of low dose levophed     eICU Interventions  D/c Cefepime, check procal, bolus 500cc LR again, will give one time dose Albumin 25gm. Continue IV flagyll, PR Vanco        Tahesha Skeet N Lacosta Hargan 10/10/2021, 5:21 AM

## 2021-10-10 NOTE — Progress Notes (Signed)
[facility] ADULT ICU REPLACEMENT PROTOCOL   The patient does apply for the [facility] Adult ICU Electrolyte Replacment Protocol based on the criteria listed below:   1.Exclusion criteria: TCTS patients, ECMO patients, and Dialysis patients 2. Is GFR >/= 30 ml/min? Yes.    Patient's GFR today is 38 3. Is SCr </= 2? Yes.   Patient's SCr is 1.72 mg/dL 4. Did SCr increase >/= 0.5 in 24 hours? No. 5.Pt's weight >40kg  Yes.   6. Abnormal electrolyte(s): mag 1.5  7. Electrolytes replaced per protocol 8.  Call MD STAT for K+ </= 2.5, Phos </= 1, or Mag </= 1 Physician:  n/a  Leon Estes 10/10/2021 6:50 AM

## 2021-10-10 NOTE — Sepsis Progress Note (Signed)
Notified bedside nurse of need to draw repeat lactic acid. 

## 2021-10-10 NOTE — Progress Notes (Signed)
NAME:  Leon Estes, MRN:  [id number], DOB:  Dec 14, 1930, LOS: 1 ADMISSION DATE:  10/09/2021, CONSULTATION DATE:  2/28 REFERRING MD:  Dr. Tonie Griffith, CHIEF COMPLAINT:  Hypotension   History of Present Illness:  Patient is encephalopathic and/or intubated. Therefore history has been obtained from chart review.  88 year old male with PMH as below, which is significant for CKD III, COPD, CAD, and HTN. He presented to [facility] ED 2/27 with complaints of AMS, fever, and diarrhea x  24 hours. Upon arrival to the ED he was found to be febrile and was not entirely oriented. There was concern for sepsis. He was started on antibiotics and given volume. Stool testing was positive for c. Difficile antigen and toxin.  He was admitted to the hospitalist service for treatment of C. Dif colitis. However, prior to being moved from the ED he developed hypotension with MAPs into the 50s refractory to an additional liter bolus. He was started on norepinephrine and PCCM was asked to evaluate the patient fo transfer to the ICU.   Pertinent  Medical History   has a past medical history of Asthma, BPH (benign prostatic hypertrophy), Cataract, Chronic renal disease, stage III ([place]), Chronic sinusitis, COPD (chronic obstructive pulmonary disease) ([place]), Coronary artery disease, Gall stones, Hearing loss, High cholesterol, Hypertension, and Macular degeneration.  Significant Hospital Events: Including procedures, antibiotic start and stop dates in addition to other pertinent events   2/27: Admitted to [facility]  2/28: Transferred to ICU for hypotension. Levophed initiated   Interim History / Subjective:   Transferred to ICU overnight.  Levophed requirements increased. Unfortunately, PIV extravasated.  No UOP documented.   Objective   Blood pressure 97/73, pulse 72, temperature 97.9 F (36.6 C), temperature source Oral, resp. rate (!) 22, weight 67.3 kg, SpO2 98 %.        Intake/Output Summary (Last 24 hours) at  10/10/2021 1210 Last data filed at 10/10/2021 0800 Gross per 24 hour  Intake 3718.05 ml  Output --  Net 3718.05 ml    Filed Weights   10/10/21 0600  Weight: 67.3 kg   Examination:  General: Acute ill appearing older gentleman. No acute distress.  HENT: Mucous membranes dry.  Lungs: Coarse crackles present in the bilateral lower lung fields. No wheezing.  Cardiovascular: Distant heart sounds. Regular rate and rhyth,  Abdomen: Quite distended, however non-tender to palpation.  Extremities: No pitting edema Skin: Right dorsal forearm with evidence of 6 cm erythema that is raised and warm to touch. Location of Levophed extravasation.  Neuro: Alert, oriented to self only.  Resolved Hospital Problem list     Assessment & Plan:   Shock, Septic and Hypovolemic C. Diff Colitis  Significantly hypovolemic on presentation with some improvement withy IVF however still requiring Levophed. C. Diff toxin and antigen positive with evidence of pan-colitis on imaging. Unfortunately, this is Mr. Bubolz 3rd episode of C. Diff this year (12/2020, 04/2021). First episode complicated by protracted course requiring extended treatment course. Previously treated with both Oral Vanc and Fidaxomicin. He was on suppressive oral vanc until   - Continue IVF resuscitation - Continue Oral Vancomycin - Continue Levophed with goal MAP > 65  - Can consider ID consult if no improvement in clinical status   Aspiration Pneumonia  History of COPD In the setting of AMS. CT chest with evidence of pneumonia.   - Continue Ceftriaxone and Metronidazole - Not currently requiring oxygen  - Continue bronchodilators as ordered - Aspiration precautions  Acute metabolic encephalopathy Secondary to sepsis.   - Supportive care   Acute Kidney Injury Anion Gap Metabolic Acidosis with Respiratory Compensation  History of CKD Suspect pre-renal in the setting of profound dehydration. Unfortunately, no UOP since admitted.    - Continue to monitor UOP - Bladder scan q8h - Aggressive IVF resuscitation - Monitor creatinine daily  Shock Liver - Minimal elevation in LFTs. Monitor daily  Normocytic Anemia - Chronic and stable  - Daily CBC  HTN CAD s/p DES (1997) - holding home medications  Schwannoma - Outpatient follow up recommended   Best Practice (right click and "Reselect all SmartList Selections" daily)   Diet/type: NPO DVT prophylaxis: prophylactic heparin  GI prophylaxis: PPI Lines: N/A Foley:  N/A Code Status:  full code Last date of multidisciplinary goals of care discussion [Pending]  Labs   CBC: Recent Labs  Lab 10/09/21 1931 10/09/21 1949 10/09/21 1950 10/10/21 0234  WBC 8.9  --   --  8.5  NEUTROABS 6.8  --   --   --   HGB 9.5* 10.9* 10.9* 8.7*  HCT 30.6* 32.0* 32.0* 28.0*  MCV 82.7  --   --  84.8  PLT 233  --   --  197     Basic Metabolic Panel: Recent Labs  Lab 10/09/21 1931 10/09/21 1949 10/09/21 1950 10/10/21 0234 10/10/21 0444  NA 134* 133* 133* 137  --   K 3.4* 5.6* 5.6* 3.4*  --   CL 105 103  --  108  --   CO2 20*  --   --  17*  --   GLUCOSE 102* 101*  --  75  --   BUN 24* 36*  --  28*  --   CREATININE 1.32* 1.20  --  1.72*  --   CALCIUM 8.2*  --   --  7.5*  --   MG 1.5*  --   --   --  1.5*  PHOS  --   --   --   --  3.5    GFR: Estimated Creatinine Clearance: 27.8 mL/min (A) (by C-G formula based on SCr of 1.72 mg/dL (H)). Recent Labs  Lab 10/09/21 1931 10/10/21 0234 10/10/21 0429  WBC 8.9 8.5  --   LATICACIDVEN 2.2* 3.8* 3.4*     Liver Function Tests: Recent Labs  Lab 10/09/21 1931 10/10/21 0234  AST 45* 40  ALT 101* 73*  ALKPHOS 70 49  BILITOT 0.6 0.9  PROT 5.3* 4.4*  ALBUMIN 2.7* 2.0*    No results for input(s): LIPASE, AMYLASE in the last 168 hours. Recent Labs  Lab 10/09/21 1931  AMMONIA 45*     ABG    Component Value Date/Time   HCO3 23.6 10/09/2021 1950   TCO2 25 10/09/2021 1950   O2SAT 99 10/09/2021 1950       Coagulation Profile: Recent Labs  Lab 10/09/21 1931  INR 1.1     Cardiac Enzymes: No results for input(s): CKTOTAL, CKMB, CKMBINDEX, TROPONINI in the last 168 hours.  HbA1C: Hgb A1c MFr Bld  Date/Time Value Ref Range Status  01/06/2021 02:50 AM 6.5 (H) 4.8 - 5.6 % Final    Comment:    (NOTE)         Prediabetes: 5.7 - 6.4         Diabetes: >6.4         Glycemic control for adults with diabetes: <7.0   02/02/2020 10:30 AM 6.9 (H) 4.6 - 6.5 % Final  Comment:    Glycemic Control Guidelines for People with Diabetes:Non Diabetic:  <6%Goal of Therapy: <7%Additional Action Suggested:  >8%     CBG: Recent Labs  Lab 10/10/21 0222 10/10/21 0515 10/10/21 0546 10/10/21 0708 10/10/21 1036  GLUCAP 85 68* 88 68* 73     Review of Systems:     Past Medical History:  He,  has a past medical history of Asthma, BPH (benign prostatic hypertrophy), Cataract, Chronic renal disease, stage III ([facility]), Chronic sinusitis, COPD (chronic obstructive pulmonary disease) ([place]), Coronary artery disease, Gall stones, Hearing loss, High cholesterol, Hypertension, and Macular degeneration.   Surgical History:   Past Surgical History:  Procedure Laterality Date   APPENDECTOMY     CORONARY ANGIOPLASTY WITH STENT PLACEMENT  1998   2 stents   TRANSURETHRAL RESECTION OF PROSTATE       Social History:   reports that he quit smoking about 16 years ago. His smoking use included cigarettes. He has been exposed to tobacco smoke. His smokeless tobacco use includes chew. He reports that he does not drink alcohol and does not use drugs.   Family History:  His family history includes Diabetes in his father; Heart disease in his brother.   Allergies Allergies  Allergen Reactions   Sulfa Antibiotics Rash   Bacitracin-Polymyxin B     Other reaction(s): rash, swelling   Benzalkonium Chloride     "Benzalkonium chloride is a quaternary ammonium antiseptic and disinfectant with actions and uses  similar to those of other cationic surfactants. It is also used as an antimicrobial preservative for pharmaceutical products."   Neosporin [Neomycin-Bacitracin Zn-Polymyx]    Sulfamethoxazole-Trimethoprim     Other reaction(s): rash   Augmentin [Amoxicillin-Pot Clavulanate] Rash   Terbinafine Rash   Terbinafine And Related Rash     Home Medications  Prior to Admission medications   Medication Sig Start Date End Date Taking? Authorizing Provider  acetaminophen (TYLENOL) 500 MG tablet Take 1,000 mg by mouth every 6 (six) hours as needed for moderate pain or headache.    [provider]  budesonide-formoterol (SYMBICORT) 160-4.5 MCG/ACT inhaler INHALE 2 PUFFS INTO THE LUNGS TWICE DAILY 08/07/21   Venia Carbon, MD  gabapentin (NEURONTIN) 400 MG capsule Take 1 capsule (400 mg total) by mouth at bedtime. 09/25/21   Copland, Frederico Hamman, MD  Multiple Vitamin (MULTIVITAMIN WITH MINERALS) TABS tablet Take 1 tablet by mouth daily. 01/23/21   Georgette Shell, MD  omeprazole (PRILOSEC) 20 MG capsule Take 1 capsule (20 mg total) by mouth 2 (two) times daily before a meal. On an empty stomach 09/28/21   Venia Carbon, MD  potassium chloride (KLOR-CON) 10 MEQ tablet Take 1 tablet (10 mEq total) by mouth daily. 03/15/21   Venia Carbon, MD  Probiotic Product (PROBIOTIC DAILY PO) Take 1 capsule by mouth daily.    [provider]  QUEtiapine (SEROQUEL) 25 MG tablet Take 1 tablet (25 mg total) by mouth at bedtime. 08/18/21   Venia Carbon, MD  simvastatin (ZOCOR) 40 MG tablet Take 40 mg by mouth every evening.    [provider]  tamsulosin (FLOMAX) 0.4 MG CAPS capsule TAKE 1 CAPSULE BY MOUTH ONCE DAILY 08/07/21   Venia Carbon, MD  traMADol (ULTRAM) 50 MG tablet Take 1 tablet (50 mg total) by mouth daily as needed. TAKE 1 TABLET BY MOUTH ONCE A DAY AS NEEDED FOR PAIN 08/18/21   Viviana Simpler I, MD  triamcinolone cream (KENALOG) 0.5 % Apply topically. 09/07/21  [provider]  vancomycin (VANCOCIN) 125 MG capsule Take 1 capsule (125 mg total) by mouth 4 (four) times daily. For 2 weeks. Then twice a day for 1 week, then daily for 1 week. Finally, continue it every other day for 8 weeks Patient taking differently: Take 125 mg by mouth 4 (four) times daily. For 2 weeks. Then twice a day for 1 week, then daily for 1 week. Finally, continue it every other day for 8 weeks. 09/28/21 Has 6 tablets left 07/20/21   Venia Carbon, MD     Dr. Jose Persia Internal Medicine PGY-3  10/10/2021, 12:54 PM

## 2021-10-10 NOTE — Progress Notes (Signed)
[facility] ADULT ICU REPLACEMENT PROTOCOL   The patient does apply for the [facility] Adult ICU Electrolyte Replacment Protocol based on the criteria listed below:   1.Exclusion criteria: TCTS patients, ECMO patients, and Dialysis patients 2. Is GFR >/= 30 ml/min? Yes.    Patient's GFR today is 38 3. Is SCr </= 2? Yes.   Patient's SCr is 1.72 mg/dL 4. Did SCr increase >/= 0.5 in 24 hours? No. 5.Pt's weight >40kg  Yes.   6. Abnormal electrolyte(s): K+ 3.4  7. Electrolytes replaced per protocol 8.  Call MD STAT for K+ </= 2.5, Phos </= 1, or Mag </= 1 Physician:  n/a  Leon Estes 10/10/2021 5:27 AM

## 2021-10-10 NOTE — ED Notes (Signed)
Dr. Tonie Griffith notified of hypotension

## 2021-10-10 NOTE — Progress Notes (Signed)
° °  Echocardiogram 2D Echocardiogram has been performed.  Leon Estes 10/10/2021, 11:51 AM

## 2021-10-10 NOTE — Consult Note (Signed)
NAME:  Leon Estes, MRN:  [id number], DOB:  1931-04-13, LOS: 1 ADMISSION DATE:  10/09/2021, CONSULTATION DATE:  2/28 REFERRING MD:  Dr. Tonie Griffith, CHIEF COMPLAINT:  Hypotension   History of Present Illness:  Patient is encephalopathic and/or intubated. Therefore history has been obtained from chart review.  88 year old male with PMH as below, which is significant for CKD III, COPD, CAD, and HTN. He presented to [facility] ED 2/27 with complaints of AMS, fever, and diarrhea x  24 hours. Upon arrival to the ED he was found to be febrile and was not entirely oriented. There was concern for sepsis. He was started on antibiotics and given volume. Stool testing was positive for c. Difficile antigen and toxin.  He was admitted to the hospitalist service for treatment of C. Dif colitis. However, prior to being moved from the ED he developed hypotension with MAPs into the 50s refractory to an additional liter bolus. He was started on norepinephrine and PCCM was asked to evaluate the patient fo transfer to the ICU.    Pertinent  Medical History   has a past medical history of Asthma, BPH (benign prostatic hypertrophy), Cataract, Chronic renal disease, stage III ([place]), Chronic sinusitis, COPD (chronic obstructive pulmonary disease) ([place]), Coronary artery disease, Gall stones, Hearing loss, High cholesterol, Hypertension, and Macular degeneration.   Significant Hospital Events: Including procedures, antibiotic start and stop dates in addition to other pertinent events     Interim History / Subjective:    Objective   Blood pressure (!) 74/39, pulse 92, temperature (!) 102.8 F (39.3 C), temperature source Rectal, resp. rate (!) 26, SpO2 98 %.        Intake/Output Summary (Last 24 hours) at 10/10/2021 0307 Last data filed at 10/10/2021 0243 Gross per 24 hour  Intake 1800 ml  Output --  Net 1800 ml   There were no vitals filed for this visit.  Examination: General: cachectic elderly male in  mild distress HENT: [place]/AT, PERRL, no JVD. Mucous membranes VERY dry.  Lungs: Clear bilateral breath sounds. No distress.  Cardiovascular: Tachy, regular, no MRG Abdomen: Soft, non-tender, non-distended Extremities: No acute deformity Neuro: Alert, oriented to self only. Follows commands loosely.   Resolved Hospital Problem list     Assessment & Plan:   Shock: Components of both septic and hypovolemic shock. Septic from recurrent C. Dif colitis.  - Aggressive IVF resuscitation - Norepinephrine for MAP goal 65 - He is quite hypovolemic on exam. Volume should be primary means at this point. Given an additional 2L LR.  - Echocardiogram - Cefepime, flagyl, vanco given in the ED.  - IV flagyl and vanco PR for now.  - CT abdomen pelvis pending - Rectal tube in place.  - Trend lactic   Acute metabolic encephalopathy secondary to shock - CT head non-acute - Treat shock, sepsis as above.   CKD: suspect an acute component will develop with CT contrast and a dose of Toradol given.  Electrolyte abnormalities.   - repeat BMP now - mag replaced.   HTN CAD - holding home medications  COPD without acute exacerbation - symbicort > breo  Schwannoma - needs  neurosurgery outpatient follow up   Best Practice (right click and "Reselect all SmartList Selections" daily)   Diet/type: NPO DVT prophylaxis: prophylactic heparin  GI prophylaxis: PPI Lines: N/A Foley:  N/A Code Status:  full code Last date of multidisciplinary goals of care discussion []   Labs   CBC: Recent Labs  Lab  10/09/21 1931 10/09/21 1949 10/09/21 1950 10/10/21 0234  WBC 8.9  --   --  8.5  NEUTROABS 6.8  --   --   --   HGB 9.5* 10.9* 10.9* 8.7*  HCT 30.6* 32.0* 32.0* 28.0*  MCV 82.7  --   --  84.8  PLT 233  --   --  937    Basic Metabolic Panel: Recent Labs  Lab 10/09/21 1931 10/09/21 1949 10/09/21 1950  NA 134* 133* 133*  K 3.4* 5.6* 5.6*  CL 105 103  --   CO2 20*  --   --   GLUCOSE 102* 101*   --   BUN 24* 36*  --   CREATININE 1.32* 1.20  --   CALCIUM 8.2*  --   --   MG 1.5*  --   --    GFR: Estimated Creatinine Clearance: 38.5 mL/min (by C-G formula based on SCr of 1.2 mg/dL). Recent Labs  Lab 10/09/21 1931 10/10/21 0234  WBC 8.9 8.5  LATICACIDVEN 2.2*  --     Liver Function Tests: Recent Labs  Lab 10/09/21 1931  AST 45*  ALT 101*  ALKPHOS 70  BILITOT 0.6  PROT 5.3*  ALBUMIN 2.7*   No results for input(s): LIPASE, AMYLASE in the last 168 hours. Recent Labs  Lab 10/09/21 1931  AMMONIA 45*    ABG    Component Value Date/Time   HCO3 23.6 10/09/2021 1950   TCO2 25 10/09/2021 1950   O2SAT 99 10/09/2021 1950     Coagulation Profile: Recent Labs  Lab 10/09/21 1931  INR 1.1    Cardiac Enzymes: No results for input(s): CKTOTAL, CKMB, CKMBINDEX, TROPONINI in the last 168 hours.  HbA1C: Hgb A1c MFr Bld  Date/Time Value Ref Range Status  01/06/2021 02:50 AM 6.5 (H) 4.8 - 5.6 % Final    Comment:    (NOTE)         Prediabetes: 5.7 - 6.4         Diabetes: >6.4         Glycemic control for adults with diabetes: <7.0   02/02/2020 10:30 AM 6.9 (H) 4.6 - 6.5 % Final    Comment:    Glycemic Control Guidelines for People with Diabetes:Non Diabetic:  <6%Goal of Therapy: <7%Additional Action Suggested:  >8%     CBG: Recent Labs  Lab 10/10/21 0222  GLUCAP 12    Review of Systems:     Past Medical History:  He,  has a past medical history of Asthma, BPH (benign prostatic hypertrophy), Cataract, Chronic renal disease, stage III ([place]), Chronic sinusitis, COPD (chronic obstructive pulmonary disease) ([place]), Coronary artery disease, Gall stones, Hearing loss, High cholesterol, Hypertension, and Macular degeneration.   Surgical History:   Past Surgical History:  Procedure Laterality Date   APPENDECTOMY     CORONARY ANGIOPLASTY WITH STENT PLACEMENT  1998   2 stents   TRANSURETHRAL RESECTION OF PROSTATE       Social History:   reports that he quit  smoking about 16 years ago. His smoking use included cigarettes. He has been exposed to tobacco smoke. His smokeless tobacco use includes chew. He reports that he does not drink alcohol and does not use drugs.   Family History:  His family history includes Diabetes in his father; Heart disease in his brother.   Allergies Allergies  Allergen Reactions   Sulfa Antibiotics Rash   Bacitracin-Polymyxin B     Other reaction(s): rash, swelling   Benzalkonium Chloride     "  Benzalkonium chloride is a quaternary ammonium antiseptic and disinfectant with actions and uses similar to those of other cationic surfactants. It is also used as an antimicrobial preservative for pharmaceutical products."   Neosporin [Neomycin-Bacitracin Zn-Polymyx]    Sulfamethoxazole-Trimethoprim     Other reaction(s): rash   Augmentin [Amoxicillin-Pot Clavulanate] Rash   Terbinafine Rash   Terbinafine And Related Rash     Home Medications  Prior to Admission medications   Medication Sig Start Date End Date Taking? Authorizing Provider  acetaminophen (TYLENOL) 500 MG tablet Take 1,000 mg by mouth every 6 (six) hours as needed for moderate pain or headache.    [provider]  budesonide-formoterol (SYMBICORT) 160-4.5 MCG/ACT inhaler INHALE 2 PUFFS INTO THE LUNGS TWICE DAILY 08/07/21   Venia Carbon, MD  gabapentin (NEURONTIN) 400 MG capsule Take 1 capsule (400 mg total) by mouth at bedtime. 09/25/21   Copland, Frederico Hamman, MD  Multiple Vitamin (MULTIVITAMIN WITH MINERALS) TABS tablet Take 1 tablet by mouth daily. 01/23/21   Georgette Shell, MD  omeprazole (PRILOSEC) 20 MG capsule Take 1 capsule (20 mg total) by mouth 2 (two) times daily before a meal. On an empty stomach 09/28/21   Venia Carbon, MD  potassium chloride (KLOR-CON) 10 MEQ tablet Take 1 tablet (10 mEq total) by mouth daily. 03/15/21   Venia Carbon, MD  Probiotic Product (PROBIOTIC DAILY PO) Take 1 capsule by mouth daily.    [provider]  QUEtiapine (SEROQUEL) 25 MG tablet Take 1 tablet (25 mg total) by mouth at bedtime. 08/18/21   Venia Carbon, MD  simvastatin (ZOCOR) 40 MG tablet Take 40 mg by mouth every evening.    [provider]  tamsulosin (FLOMAX) 0.4 MG CAPS capsule TAKE 1 CAPSULE BY MOUTH ONCE DAILY 08/07/21   Venia Carbon, MD  traMADol (ULTRAM) 50 MG tablet Take 1 tablet (50 mg total) by mouth daily as needed. TAKE 1 TABLET BY MOUTH ONCE A DAY AS NEEDED FOR PAIN 08/18/21   Viviana Simpler I, MD  triamcinolone cream (KENALOG) 0.5 % Apply topically. 09/07/21   [provider]  vancomycin (VANCOCIN) 125 MG capsule Take 1 capsule (125 mg total) by mouth 4 (four) times daily. For 2 weeks. Then twice a day for 1 week, then daily for 1 week. Finally, continue it every other day for 8 weeks Patient taking differently: Take 125 mg by mouth 4 (four) times daily. For 2 weeks. Then twice a day for 1 week, then daily for 1 week. Finally, continue it every other day for 8 weeks. 09/28/21 Has 6 tablets left 07/20/21   Venia Carbon, MD     Critical care time: 39 minutes     Georgann Housekeeper, AGACNP-BC [facility]  See Amion for personal pager PCCM on call pager [phone] until 7pm. Please call Elink 7p-7a. [phone]  10/10/2021 3:34 AM

## 2021-10-10 NOTE — Procedures (Signed)
Central Venous Catheter Insertion Procedure Note  Leon Estes  [id number]  06-26-1931  Date:10/10/21  Time:3:43 PM   Provider Performing:Alfonzo Arca   Procedure: Insertion of Non-tunneled Central Venous [phone]) with US guidance (02637)   Indication(s) Medication administration  Consent Risks of the procedure as well as the alternatives and risks of each were explained to the patient and/or caregiver.  Consent for the procedure was obtained and is signed in the bedside chart  Anesthesia Topical only with 1% lidocaine   Timeout Verified patient identification, verified procedure, site/side was marked, verified correct patient position, special equipment/implants available, medications/allergies/relevant history reviewed, required imaging and test results available.  Sterile Technique Maximal sterile technique including full sterile barrier drape, hand hygiene, sterile gown, sterile gloves, mask, hair covering, sterile ultrasound probe cover (if used).  Procedure Description Area of catheter insertion was cleaned with chlorhexidine and draped in sterile fashion.  With real-time ultrasound guidance a central venous catheter was placed into the left subclavian vein. Nonpulsatile blood flow and easy flushing noted in all ports.  The catheter was sutured in place and sterile dressing applied.  Complications/Tolerance None; patient tolerated the procedure well. Chest X-ray is ordered to verify placement for internal jugular or subclavian cannulation.   Chest x-ray is not ordered for femoral cannulation.  EBL Minimal  Specimen(s) None

## 2021-10-10 NOTE — H&P (Signed)
History and Physical    Patient: Leon Estes EQA:[id number] DOB: Nov 23, 1930 DOA: 10/09/2021 DOS: the patient was seen and examined on 10/10/2021 PCP: Venia Carbon, MD  Presents from Home  Chief Complaint:  Chief Complaint  Patient presents with   Altered Mental Status    HPI: Leon Estes is a 88 y.o. male with medical history significant for recent diagnosis of schwannoma, recurrent [name], CAD s/p PCI, COPD, CKD 3, HTN, HLD, BPH, who p/w EMS from home for AMS, fever, diarrhea x 1d.  No family is at the bedside and cannot be reached by phone.  History was provided by the ER provider.  Patient reportedly had fever and has had diarrhea.  He has altered mental status and cannot provide any history and does not answer questions. The emergency room patient is tachycardic and is febrile with a Tmax of 102.8 degrees.  Blood pressure has been 130-160/46-150.  Lab work reveals C. difficile and stool.  Review of Systems: unable to review all systems due to the inability of the patient to answer questions. Past Medical History:  Diagnosis Date   Asthma    BPH (benign prostatic hypertrophy)    Cataract    Chronic renal disease, stage III (HCC)    Chronic sinusitis    COPD (chronic obstructive pulmonary disease) ([facility])    Coronary artery disease    2 stents   Gall stones    Hearing loss    DOES NOT WEAR HEARING AIDS   High cholesterol    Hypertension    Macular degeneration    right eye   Past Surgical History:  Procedure Laterality Date   APPENDECTOMY     CORONARY ANGIOPLASTY WITH STENT PLACEMENT  1998   2 stents   TRANSURETHRAL RESECTION OF PROSTATE     Social History:  reports that he quit smoking about 16 years ago. His smoking use included cigarettes. He has been exposed to tobacco smoke. His smokeless tobacco use includes chew. He reports that he does not drink alcohol and does not use drugs.  Allergies  Allergen Reactions   Sulfa Antibiotics Rash    Bacitracin-Polymyxin B     Other reaction(s): rash, swelling   Benzalkonium Chloride     "Benzalkonium chloride is a quaternary ammonium antiseptic and disinfectant with actions and uses similar to those of other cationic surfactants. It is also used as an antimicrobial preservative for pharmaceutical products."   Neosporin [Neomycin-Bacitracin Zn-Polymyx]    Sulfamethoxazole-Trimethoprim     Other reaction(s): rash   Augmentin [Amoxicillin-Pot Clavulanate] Rash   Terbinafine Rash   Terbinafine And Related Rash    Family History  Problem Relation Age of Onset   Diabetes Father    Heart disease Brother     Prior to Admission medications   Medication Sig Start Date End Date Taking? Authorizing Provider  acetaminophen (TYLENOL) 500 MG tablet Take 1,000 mg by mouth every 6 (six) hours as needed for moderate pain or headache.    [provider]  budesonide-formoterol (SYMBICORT) 160-4.5 MCG/ACT inhaler INHALE 2 PUFFS INTO THE LUNGS TWICE DAILY 08/07/21   Venia Carbon, MD  gabapentin (NEURONTIN) 400 MG capsule Take 1 capsule (400 mg total) by mouth at bedtime. 09/25/21   Copland, Frederico Hamman, MD  Multiple Vitamin (MULTIVITAMIN WITH MINERALS) TABS tablet Take 1 tablet by mouth daily. 01/23/21   Georgette Shell, MD  omeprazole (PRILOSEC) 20 MG capsule Take 1 capsule (20 mg total) by mouth 2 (two) times daily  before a meal. On an empty stomach 09/28/21   Venia Carbon, MD  potassium chloride (KLOR-CON) 10 MEQ tablet Take 1 tablet (10 mEq total) by mouth daily. 03/15/21   Venia Carbon, MD  Probiotic Product (PROBIOTIC DAILY PO) Take 1 capsule by mouth daily.    [provider]  QUEtiapine (SEROQUEL) 25 MG tablet Take 1 tablet (25 mg total) by mouth at bedtime. 08/18/21   Venia Carbon, MD  simvastatin (ZOCOR) 40 MG tablet Take 40 mg by mouth every evening.    [provider]  tamsulosin (FLOMAX) 0.4 MG CAPS capsule TAKE 1 CAPSULE BY MOUTH ONCE DAILY 08/07/21    Venia Carbon, MD  traMADol (ULTRAM) 50 MG tablet Take 1 tablet (50 mg total) by mouth daily as needed. TAKE 1 TABLET BY MOUTH ONCE A DAY AS NEEDED FOR PAIN 08/18/21   Viviana Simpler I, MD  triamcinolone cream (KENALOG) 0.5 % Apply topically. 09/07/21   [provider]  vancomycin (VANCOCIN) 125 MG capsule Take 1 capsule (125 mg total) by mouth 4 (four) times daily. For 2 weeks. Then twice a day for 1 week, then daily for 1 week. Finally, continue it every other day for 8 weeks Patient taking differently: Take 125 mg by mouth 4 (four) times daily. For 2 weeks. Then twice a day for 1 week, then daily for 1 week. Finally, continue it every other day for 8 weeks. 09/28/21 Has 6 tablets left 07/20/21   Venia Carbon, MD    Physical Exam: Vitals:   10/09/21 1927 10/09/21 2021 10/09/21 2045 10/09/21 2100  BP: (!) 129/46  (!) 163/151 (!) 159/144  Pulse: (!) 107  (!) 103 (!) 105  Resp: (!) 32  20 (!) 27  Temp: (!) 100.4 F (38 C) (!) 102.8 F (39.3 C)    TempSrc: Axillary Rectal    SpO2: 92%  97% 93%   General: WDWN, ill-appearing. Eyes: PERRL, conjunctivae normal.  Sclera nonicteric HENT:  [place]/AT, external ears normal.  Nares patent without epistasis.  Mucous membranes are dry.  Neck: Soft, normal passive range of motion, supple, no masses,  Trachea midline Respiratory: clear to auscultation bilaterally, no wheezing, no crackles. Normal respiratory effort. No accessory muscle use.  Cardiovascular: Regular rhythm, tachycardia. no murmurs / rubs / gallops. 2+ pedal pulses.  Abdomen: Soft, nondistended, no rebound or guarding.  No masses palpated .Bowel sounds normoactive Musculoskeletal:  moves extremities spontaneously.no cyanosis. Normal muscle tone.  Skin: Warm, dry, intact no rashes, No induration Neurologic: Patellar DTRs +1 bilaterally.  No tremor.   Data Reviewed: Lab Work: VBG pH 7.486 PCO2 31.3 PO2 137 bicarb 25.  Sodium 134 potassium 3.4 chloride 105 bicarb 20 creatinine  1.32 BUN 24 glucose 102 magnesium 1.5 albumin 2.7 alkaline phosphatase 70 AST 45 ALT 101 bilirubin 0.6 WBC 8900 hemoglobin 9.5 hematocrit 30.6 platelets 233,000 lactic acid 2.2 INR 1.1 salicylate level less than 7 acetaminophen level 12 COVID pending Stool positive for C. difficile toxin and C. difficile antigen EKG shows sinus tachycardia with occasional PVCs with nonspecific ST changes.  QTc prolonged at 497  Chest x-ray-no acute infiltrate, consolidation pleural effusion or pulmonary edema.  CT head negative for acute intracranial process.  Chronic atrophy and microvascular ischemic changes   Assessment and Plan: * Sepsis ([place])- (present on admission) Mr. Knotts is admitted to [facility] care with recurrent C. Difficile causing sepsis. He was given broad spectrum antibiotic in ER which is stopped since came back with positive C.  Difficle.  Place NG tube and give Dificid per tube.  Meets sepsis criteria with C. Diff infection, fever, Tachycardia, encephalopathy Consult ID in am  IVF hydration.   Recurrent Clostridioides difficile diarrhea- (present on admission) Treat with Dificid. Consult ID in am May need GI for stool transplant treatment  COPD (chronic obstructive pulmonary disease) ([place])- (present on admission) Duonebs Q 6 hour as needed Pulmicort neb bid Supplemental oxygen as needed to keep O2 sat 11-73%  Acute metabolic encephalopathy Secondary to sepsis. Monitor  Hypomagnesemia Replace Monitor mag and electrolytes  Prolonged QT interval Avoid medications which can further prolong QT interval   Benign essential HTN- (present on admission) Monitor BP.   Advance Care Planning:   Code Status: DNR.    Lovenox for DVT prophylaxis  Consults: Will need ID consult in am  Author: Eben Burow, MD 10/10/2021 12:01 AM  For on call review [email or web address].

## 2021-10-11 DIAGNOSIS — J449 Chronic obstructive pulmonary disease, unspecified: Secondary | ICD-10-CM

## 2021-10-11 LAB — CBC WITH DIFFERENTIAL/PLATELET
Abs Immature Granulocytes: 0 10*3/uL (ref 0.00–0.07)
Basophils Absolute: 0 10*3/uL (ref 0.0–0.1)
Basophils Relative: 0 %
Eosinophils Absolute: 0 10*3/uL (ref 0.0–0.5)
Eosinophils Relative: 0 %
HCT: 25.7 % — ABNORMAL LOW (ref 39.0–52.0)
Hemoglobin: 8.5 g/dL — ABNORMAL LOW (ref 13.0–17.0)
Lymphocytes Relative: 8 %
Lymphs Abs: 0.9 10*3/uL (ref 0.7–4.0)
MCH: 26.1 pg (ref 26.0–34.0)
MCHC: 33.1 g/dL (ref 30.0–36.0)
MCV: 78.8 fL — ABNORMAL LOW (ref 80.0–100.0)
Monocytes Absolute: 0.9 10*3/uL (ref 0.1–1.0)
Monocytes Relative: 8 %
Neutro Abs: 9.3 10*3/uL — ABNORMAL HIGH (ref 1.7–7.7)
Neutrophils Relative %: 84 %
Platelets: 181 10*3/uL (ref 150–400)
RBC: 3.26 MIL/uL — ABNORMAL LOW (ref 4.22–5.81)
RDW: 17.2 % — ABNORMAL HIGH (ref 11.5–15.5)
Smear Review: ADEQUATE
WBC Morphology: INCREASED
WBC: 11.1 10*3/uL — ABNORMAL HIGH (ref 4.0–10.5)
nRBC: 0 % (ref 0.0–0.2)

## 2021-10-11 LAB — POCT I-STAT 7, (LYTES, BLD GAS, ICA,H+H)
Acid-base deficit: 9 mmol/L — ABNORMAL HIGH (ref 0.0–2.0)
Bicarbonate: 13.8 mmol/L — ABNORMAL LOW (ref 20.0–28.0)
Calcium, Ion: 1.12 mmol/L — ABNORMAL LOW (ref 1.15–1.40)
HCT: 25 % — ABNORMAL LOW (ref 39.0–52.0)
Hemoglobin: 8.5 g/dL — ABNORMAL LOW (ref 13.0–17.0)
O2 Saturation: 95 %
Patient temperature: 98.4
Potassium: 4.7 mmol/L (ref 3.5–5.1)
Sodium: 135 mmol/L (ref 135–145)
TCO2: 14 mmol/L — ABNORMAL LOW (ref 22–32)
pCO2 arterial: 19.1 mmHg — CL (ref 32–48)
pH, Arterial: 7.465 — ABNORMAL HIGH (ref 7.35–7.45)
pO2, Arterial: 69 mmHg — ABNORMAL LOW (ref 83–108)

## 2021-10-11 LAB — COMPREHENSIVE METABOLIC PANEL
ALT: 86 U/L — ABNORMAL HIGH (ref 0–44)
AST: 77 U/L — ABNORMAL HIGH (ref 15–41)
Albumin: 2.5 g/dL — ABNORMAL LOW (ref 3.5–5.0)
Alkaline Phosphatase: 61 U/L (ref 38–126)
Anion gap: 13 (ref 5–15)
BUN: 35 mg/dL — ABNORMAL HIGH (ref 8–23)
CO2: 16 mmol/L — ABNORMAL LOW (ref 22–32)
Calcium: 7.8 mg/dL — ABNORMAL LOW (ref 8.9–10.3)
Chloride: 105 mmol/L (ref 98–111)
Creatinine, Ser: 2.01 mg/dL — ABNORMAL HIGH (ref 0.61–1.24)
GFR, Estimated: 31 mL/min — ABNORMAL LOW (ref >60)
Glucose, Bld: 85 mg/dL (ref 70–99)
Potassium: 4.8 mmol/L (ref 3.5–5.1)
Sodium: 134 mmol/L — ABNORMAL LOW (ref 135–145)
Total Bilirubin: 0.8 mg/dL (ref 0.3–1.2)
Total Protein: 5 g/dL — ABNORMAL LOW (ref 6.5–8.1)

## 2021-10-11 LAB — GLUCOSE, CAPILLARY
Glucose-Capillary: 106 mg/dL — ABNORMAL HIGH (ref 70–99)
Glucose-Capillary: 137 mg/dL — ABNORMAL HIGH (ref 70–99)
Glucose-Capillary: 77 mg/dL (ref 70–99)
Glucose-Capillary: 85 mg/dL (ref 70–99)
Glucose-Capillary: 86 mg/dL (ref 70–99)
Glucose-Capillary: 92 mg/dL (ref 70–99)
Glucose-Capillary: 93 mg/dL (ref 70–99)

## 2021-10-11 MED ORDER — VANCOMYCIN 50 MG/ML ORAL SOLUTION
500.0000 mg | Freq: Four times a day (QID) | ORAL | Status: DC
Start: 2021-10-11 — End: 2021-10-11
  Filled 2021-10-11 (×4): qty 10

## 2021-10-11 MED ORDER — VANCOMYCIN 50 MG/ML ORAL SOLUTION
500.0000 mg | Freq: Four times a day (QID) | ORAL | Status: DC
  Filled 2021-10-11 (×3): qty 10

## 2021-10-11 MED ORDER — VANCOMYCIN HCL 125 MG PO CAPS
500.0000 mg | ORAL_CAPSULE | Freq: Four times a day (QID) | ORAL | Status: DC
  Administered 2021-10-11 – 2021-10-15 (×15): 500 mg via ORAL
  Filled 2021-10-11 (×2): qty 4
  Filled 2021-10-11 (×4): qty 2
  Filled 2021-10-11 (×6): qty 4
  Filled 2021-10-11 (×3): qty 2
  Filled 2021-10-11 (×2): qty 4

## 2021-10-11 MED ORDER — FUROSEMIDE 10 MG/ML IJ SOLN
40.0000 mg | Freq: Once | INTRAMUSCULAR | Status: AC
  Administered 2021-10-11: 40 mg via INTRAVENOUS
  Filled 2021-10-11: qty 4

## 2021-10-11 MED ORDER — VANCOMYCIN HCL 250 MG PO CAPS
500.0000 mg | ORAL_CAPSULE | Freq: Once | ORAL | Status: AC
  Administered 2021-10-11: 500 mg via ORAL
  Filled 2021-10-11: qty 2

## 2021-10-11 MED ORDER — VANCOMYCIN HCL 250 MG PO CAPS
500.0000 mg | ORAL_CAPSULE | Freq: Four times a day (QID) | ORAL | Status: DC
  Filled 2021-10-11 (×3): qty 2

## 2021-10-11 NOTE — Progress Notes (Signed)
? ?NAME:  YOSHI MANCILLAS, MRN:  [id number], DOB:  08-31-1930, LOS: 2 ?ADMISSION DATE:  10/09/2021, CONSULTATION DATE:  2/28 ?REFERRING MD:  Dr. Tonie Griffith, CHIEF COMPLAINT:  Hypotension  ? ?History of Present Illness:  ?Patient is encephalopathic and/or intubated. Therefore history has been obtained from chart review.  88 year old male with PMH as below, which is significant for CKD III, COPD, CAD, and HTN. He presented to [place] ED 2/27 with complaints of AMS, fever, and diarrhea x  24 hours. Upon arrival to the ED he was found to be febrile and was not entirely oriented. There was concern for sepsis. He was started on antibiotics and given volume. Stool testing was positive for c. Difficile antigen and toxin.  He was admitted to the hospitalist service for treatment of C. Dif colitis. However, prior to being moved from the ED he developed hypotension with MAPs into the 50s refractory to an additional liter bolus. He was started on norepinephrine and PCCM was asked to evaluate the patient fo transfer to the ICU.  ? ?Pertinent  Medical History  ? has a past medical history of Asthma, BPH (benign prostatic hypertrophy), Cataract, Chronic renal disease, stage III ([place]), Chronic sinusitis, COPD (chronic obstructive pulmonary disease) ([place]), Coronary artery disease, Gall stones, Hearing loss, High cholesterol, Hypertension, and Macular degeneration. ? ?Significant Hospital Events: ?Including procedures, antibiotic start and stop dates in addition to other pertinent events   ?2/27: Admitted to [facility]  ?2/28: Transferred to ICU for hypotension. Levophed initiated  ? ?Interim History / Subjective:  ? ?Patient came off of vasopressors ?His serum creatinine continue to rise, with drop in serum bicarbonate ? ?Objective   ?Blood pressure 129/77, pulse 72, temperature 98.4 ?F (36.9 ?C), temperature source Oral, resp. rate 20, weight 69.1 kg, SpO2 100 %. ?   ?   ? ?Intake/Output Summary (Last 24 hours) at 10/11/2021 1023 ?Last data  filed at 10/11/2021 0800 ?Gross per 24 hour  ?Intake 4231.45 ml  ?Output 1655 ml  ?Net 2576.45 ml  ? ?Filed Weights  ? 10/10/21 0600 10/11/21 0045 10/11/21 0357  ?Weight: 67.3 kg 69.1 kg 69.1 kg  ? ?Examination:  ?General: Acute ill appearing elderly male, lying on the bed, very emotional, crying this morning  ?HENT: Atraumatic, normocephalic mucous membranes dry.  ?Lungs: Coarse crackles present in the bilateral lower lung fields. No wheezing.  ?Cardiovascular: Distant heart sounds. Regular rate and rhythm,  ?Abdomen: Quite distended, however non-tender to palpation.  ?Extremities: No pitting edema ?Skin: Right dorsal forearm with evidence of 6 cm erythema that is raised and warm to touch. Location of Levophed extravasation.  ?Neuro: Alert, oriented to self only.  No focal neurological deficit ? ?Resolved Hospital Problem list   ? ?Shock septic/hypovolemic ?Assessment & Plan:  ?Recurrent C. Diff Colitis  ?Patient came off of IV vasopressor ?Able to maintain MAP of 65 ?Stop IV fluid ?Continue p.o. vancomycin and IV Flagyl ?Previously treated with both Oral Vanc and Fidaxomicin.  ?Continue contact isolation ? ?Aspiration Pneumonia  ?History of COPD, not in exacerbation ?In the setting of AMS. CT chest with evidence of pneumonia.  ?Continue Ceftriaxone ?Continue bronchodilators as ordered ?Aspiration precautions  ?Advance to full liquid diet ? ?Acute septic/metabolic encephalopathy ?Secondary to sepsis.  ?His mental status has improved ?Continue Supportive care  ? ?Acute Kidney Injury on CKD stage II ?High anion Gap Metabolic Acidosis with Respiratory Compensation  ?Patient serum creatinine continue to rise despite aggressive IV fluid therapy ?He is off vasopressors ?He  may be developing cardiorenal syndrome ?We will try Lasix 40 mg once ? ?Shock Liver ?LFT continue to rise which suspect there may be liver congestion from new biventricular heart failure ? ?Anemia of critical illness ?H&H remained stable ? ?HTN ?CAD s/p  DES (1997) ?holding home medications ? ?Hypokalemia/hypomagnesemia/hypocalcemia ?Continue aggressive electrolyte supplement ? ?New diagnosis of biventricular systolic heart failure, not in exacerbation ?We will give him 40 mg of IV Lasix ?Monitor intake and output ? ?Best Practice (right click and "Reselect all SmartList Selections" daily)  ? ?Diet/type: Advance to full liquid diet ?DVT prophylaxis: prophylactic heparin  ?GI prophylaxis: PPI ?Lines: N/A ?Foley:  N/A ?Code Status: DNR ?Last date of multidisciplinary goals of care discussion [patient is DNR/DNI] ? ?Labs   ?CBC: ?Recent Labs  ?Lab 10/09/21 ?1931 10/09/21 ?1949 10/09/21 ?1950 10/10/21 ?0234 10/11/21 ?0102 10/11/21 ?2505  ?WBC 8.9  --   --  8.5 11.1*  --   ?NEUTROABS 6.8  --   --   --  9.3*  --   ?HGB 9.5* 10.9* 10.9* 8.7* 8.5* 8.5*  ?HCT 30.6* 32.0* 32.0* 28.0* 25.7* 25.0*  ?MCV 82.7  --   --  84.8 78.8*  --   ?PLT 233  --   --  197 181  --   ? ? ?Basic Metabolic Panel: ?Recent Labs  ?Lab 10/09/21 ?1931 10/09/21 ?1949 10/09/21 ?1950 10/10/21 ?0234 10/10/21 ?3976 10/11/21 ?0102 10/11/21 ?7341  ?NA 134* 133* 133* 137  --  134* 135  ?K 3.4* 5.6* 5.6* 3.4*  --  4.8 4.7  ?CL 105 103  --  108  --  105  --   ?CO2 20*  --   --  17*  --  16*  --   ?GLUCOSE 102* 101*  --  75  --  85  --   ?BUN 24* 36*  --  28*  --  35*  --   ?CREATININE 1.32* 1.20  --  1.72*  --  2.01*  --   ?CALCIUM 8.2*  --   --  7.5*  --  7.8*  --   ?MG 1.5*  --   --   --  1.5*  --   --   ?PHOS  --   --   --   --  3.5  --   --   ? ?GFR: ?Estimated Creatinine Clearance: 23.8 mL/min (A) (by C-G formula based on SCr of 2.01 mg/dL (H)). ?Recent Labs  ?Lab 10/09/21 ?1931 10/10/21 ?0234 10/10/21 ?0429 10/11/21 ?0102  ?WBC 8.9 8.5  --  11.1*  ?LATICACIDVEN 2.2* 3.8* 3.4*  --   ? ? ?Liver Function Tests: ?Recent Labs  ?Lab 10/09/21 ?1931 10/10/21 ?0234 10/11/21 ?0102  ?AST 45* 40 77*  ?ALT 101* 73* 86*  ?ALKPHOS 93 79 02  ?BILITOT 0.6 0.9 0.8  ?PROT 5.3* 4.4* 5.0*  ?ALBUMIN 2.7* 2.0* 2.5*  ? ?No  results for input(s): LIPASE, AMYLASE in the last 168 hours. ?Recent Labs  ?Lab 10/09/21 ?1931  ?AMMONIA 45*  ? ? ?ABG ?   ?Component Value Date/Time  ? PHART 7.465 (H) 10/11/2021 4097  ? PCO2ART 19.1 (LL) 10/11/2021 0641  ? PO2ART 69 (L) 10/11/2021 0641  ? HCO3 13.8 (L) 10/11/2021 0641  ? TCO2 14 (L) 10/11/2021 0641  ? ACIDBASEDEF 9.0 (H) 10/11/2021 0641  ? O2SAT 95 10/11/2021 0641  ?  ? ?Coagulation Profile: ?Recent Labs  ?Lab 10/09/21 ?1931  ?INR 1.1  ? ? ?Cardiac Enzymes: ?No results for input(s): CKTOTAL, CKMB,  CKMBINDEX, TROPONINI in the last 168 hours. ? ?HbA1C: ?Hgb A1c MFr Bld  ?Date/Time Value Ref Range Status  ?01/06/2021 02:50 AM 6.5 (H) 4.8 - 5.6 % Final  ?  Comment:  ?  (NOTE) ?        Prediabetes: 5.7 - 6.4 ?        Diabetes: >6.4 ?        Glycemic control for adults with diabetes: <7.0 ?  ?02/02/2020 10:30 AM 6.9 (H) 4.6 - 6.5 % Final  ?  Comment:  ?  Glycemic Control Guidelines for People with Diabetes:Non Diabetic:  <6%Goal of Therapy: <7%Additional Action Suggested:  >8%   ? ? ?CBG: ?Recent Labs  ?Lab 10/10/21 ?1934 10/10/21 ?2007 10/11/21 ?0040 10/11/21 ?1610 10/11/21 ?9604  ?GLUCAP 61* 100* 86 106* 93  ? ?Total critical care time: 35 minutes ? ?Performed by: Jacky Kindle ?  ?Critical care time was exclusive of separately billable procedures and treating other patients. ?  ?Critical care was necessary to treat or prevent imminent or life-threatening deterioration. ?  ?Critical care was time spent personally by me on the following activities: development of treatment plan with patient and/or surrogate as well as nursing, discussions with consultants, evaluation of patient's response to treatment, examination of patient, obtaining history from patient or surrogate, ordering and performing treatments and interventions, ordering and review of laboratory studies, ordering and review of radiographic studies, pulse oximetry and re-evaluation of patient's condition. ?  ?Jacky Kindle MD ?[facility] ?See Amion for pager ?If no response to pager, please call [phone] until 7pm ?After 7pm, Please call E-link [phone] ? ? ? ?

## 2021-10-11 NOTE — Progress Notes (Signed)
88 year old male here with septic shock due to right lower lobe pneumonia and recurrent [name] ? ?Required IV vasopressor to maintain map goal 65,  ? ?Elink called for Afib (rate controlled) and confusion, concern for need for Abg ? ?Plans:  ?-Recurrent [name], Aspiration pneumonia on top of baseline COPD- could be retaining CO2 will check ABG ?-echocardiogram showed biventricular systolic congestive heart failure, ? sepsis induced cardiomyopathy ?-will not attempt rate control of Afib, this is in the setting of sepsis, COPD, cardiomyopathy, currently rate is controlled, will accept rates up to 120's.  ?-no beta blockers given sepsis ?

## 2021-10-12 ENCOUNTER — Encounter (HOSPITAL_COMMUNITY): Payer: Self-pay | Admitting: Internal Medicine

## 2021-10-12 DIAGNOSIS — I480 Paroxysmal atrial fibrillation: Secondary | ICD-10-CM

## 2021-10-12 DIAGNOSIS — I5022 Chronic systolic (congestive) heart failure: Secondary | ICD-10-CM

## 2021-10-12 DIAGNOSIS — I5020 Unspecified systolic (congestive) heart failure: Secondary | ICD-10-CM

## 2021-10-12 DIAGNOSIS — I4891 Unspecified atrial fibrillation: Secondary | ICD-10-CM

## 2021-10-12 DIAGNOSIS — J189 Pneumonia, unspecified organism: Secondary | ICD-10-CM

## 2021-10-12 DIAGNOSIS — R7989 Other specified abnormal findings of blood chemistry: Secondary | ICD-10-CM

## 2021-10-12 DIAGNOSIS — E872 Acidosis, unspecified: Secondary | ICD-10-CM

## 2021-10-12 DIAGNOSIS — E8729 Other acidosis: Secondary | ICD-10-CM

## 2021-10-12 DIAGNOSIS — I5041 Acute combined systolic (congestive) and diastolic (congestive) heart failure: Secondary | ICD-10-CM

## 2021-10-12 HISTORY — DX: Acidosis, unspecified: E87.20

## 2021-10-12 LAB — CBC WITH DIFFERENTIAL/PLATELET
Abs Immature Granulocytes: 0 10*3/uL (ref 0.00–0.07)
Basophils Absolute: 0 10*3/uL (ref 0.0–0.1)
Basophils Relative: 0 %
Eosinophils Absolute: 0.3 10*3/uL (ref 0.0–0.5)
Eosinophils Relative: 2 %
HCT: 24.2 % — ABNORMAL LOW (ref 39.0–52.0)
Hemoglobin: 7.9 g/dL — ABNORMAL LOW (ref 13.0–17.0)
Lymphocytes Relative: 8 %
Lymphs Abs: 1.3 10*3/uL (ref 0.7–4.0)
MCH: 25.7 pg — ABNORMAL LOW (ref 26.0–34.0)
MCHC: 32.6 g/dL (ref 30.0–36.0)
MCV: 78.8 fL — ABNORMAL LOW (ref 80.0–100.0)
Monocytes Absolute: 0.6 10*3/uL (ref 0.1–1.0)
Monocytes Relative: 4 %
Neutro Abs: 13.7 10*3/uL — ABNORMAL HIGH (ref 1.7–7.7)
Neutrophils Relative %: 86 %
Platelets: 170 10*3/uL (ref 150–400)
RBC: 3.07 MIL/uL — ABNORMAL LOW (ref 4.22–5.81)
RDW: 17.1 % — ABNORMAL HIGH (ref 11.5–15.5)
WBC: 15.9 10*3/uL — ABNORMAL HIGH (ref 4.0–10.5)
nRBC: 0 % (ref 0.0–0.2)
nRBC: 0 /100 WBC

## 2021-10-12 LAB — COMPREHENSIVE METABOLIC PANEL
ALT: 119 U/L — ABNORMAL HIGH (ref 0–44)
AST: 94 U/L — ABNORMAL HIGH (ref 15–41)
Albumin: 2.4 g/dL — ABNORMAL LOW (ref 3.5–5.0)
Alkaline Phosphatase: 70 U/L (ref 38–126)
Anion gap: 14 (ref 5–15)
BUN: 34 mg/dL — ABNORMAL HIGH (ref 8–23)
CO2: 18 mmol/L — ABNORMAL LOW (ref 22–32)
Calcium: 8 mg/dL — ABNORMAL LOW (ref 8.9–10.3)
Chloride: 105 mmol/L (ref 98–111)
Creatinine, Ser: 1.56 mg/dL — ABNORMAL HIGH (ref 0.61–1.24)
GFR, Estimated: 42 mL/min — ABNORMAL LOW (ref >60)
Glucose, Bld: 92 mg/dL (ref 70–99)
Potassium: 3.4 mmol/L — ABNORMAL LOW (ref 3.5–5.1)
Sodium: 137 mmol/L (ref 135–145)
Total Bilirubin: 0.6 mg/dL (ref 0.3–1.2)
Total Protein: 5.2 g/dL — ABNORMAL LOW (ref 6.5–8.1)

## 2021-10-12 LAB — GLUCOSE, CAPILLARY
Glucose-Capillary: 86 mg/dL (ref 70–99)
Glucose-Capillary: 97 mg/dL (ref 70–99)
Glucose-Capillary: 108 mg/dL — ABNORMAL HIGH (ref 70–99)
Glucose-Capillary: 95 mg/dL (ref 70–99)
Glucose-Capillary: 164 mg/dL — ABNORMAL HIGH (ref 70–99)

## 2021-10-12 LAB — MAGNESIUM: Magnesium: 2.4 mg/dL (ref 1.7–2.4)

## 2021-10-12 MED ORDER — FUROSEMIDE 10 MG/ML IJ SOLN
40.0000 mg | Freq: Two times a day (BID) | INTRAMUSCULAR | Status: DC
  Administered 2021-10-12 – 2021-10-13 (×2): 40 mg via INTRAVENOUS
  Filled 2021-10-12 (×2): qty 4

## 2021-10-12 MED ORDER — POTASSIUM CHLORIDE 10 MEQ/50ML IV SOLN
10.0000 meq | Freq: Once | INTRAVENOUS | Status: AC
  Administered 2021-10-12: 10 meq via INTRAVENOUS

## 2021-10-12 MED ORDER — POTASSIUM CHLORIDE 10 MEQ/50ML IV SOLN
10.0000 meq | INTRAVENOUS | Status: AC
  Administered 2021-10-12 (×3): 10 meq via INTRAVENOUS
  Filled 2021-10-12 (×4): qty 50

## 2021-10-12 MED ORDER — TAMSULOSIN HCL 0.4 MG PO CAPS
0.4000 mg | ORAL_CAPSULE | Freq: Every day | ORAL | Status: DC
Start: 2021-10-12 — End: 2021-10-17
  Administered 2021-10-12 – 2021-10-17 (×6): 0.4 mg via ORAL
  Filled 2021-10-12 (×6): qty 1

## 2021-10-12 MED ORDER — POTASSIUM CHLORIDE CRYS ER 20 MEQ PO TBCR
40.0000 meq | EXTENDED_RELEASE_TABLET | Freq: Once | ORAL | Status: AC
  Administered 2021-10-12: 40 meq via ORAL
  Filled 2021-10-12: qty 2

## 2021-10-12 MED ORDER — SODIUM CHLORIDE 0.9% FLUSH
10.0000 mL | Freq: Two times a day (BID) | INTRAVENOUS | Status: DC
  Administered 2021-10-12 (×2): 10 mL

## 2021-10-12 MED ORDER — FUROSEMIDE 10 MG/ML IJ SOLN
40.0000 mg | Freq: Once | INTRAMUSCULAR | Status: AC
  Administered 2021-10-12: 40 mg via INTRAVENOUS
  Filled 2021-10-12: qty 4

## 2021-10-12 MED ORDER — QUETIAPINE FUMARATE 25 MG PO TABS
25.0000 mg | ORAL_TABLET | Freq: Every day | ORAL | Status: DC
  Administered 2021-10-12 – 2021-10-16 (×5): 25 mg via ORAL
  Filled 2021-10-12 (×5): qty 1

## 2021-10-12 MED ORDER — SODIUM CHLORIDE 0.9% FLUSH: 10.0000 mL | INTRAVENOUS | Status: DC | PRN

## 2021-10-12 MED ORDER — POTASSIUM CHLORIDE 10 MEQ/100ML IV SOLN
10.0000 meq | INTRAVENOUS | Status: AC
  Administered 2021-10-12 (×2): 10 meq via INTRAVENOUS
  Filled 2021-10-12 (×2): qty 100

## 2021-10-12 NOTE — Progress Notes (Signed)
K+ 3.4 Replaced per protocol  

## 2021-10-12 NOTE — Progress Notes (Signed)
Pt received in room 5 [place] 01. Alert and stable. Vitals obtained, telemetry applied and pt made comfortable. ?

## 2021-10-12 NOTE — Consult Note (Addendum)
Cardiology Consultation:   Patient ID: SEIYA SILSBY MRN: [id number]; DOB: [birth year]  Admit date: 10/09/2021 Date of Consult: 10/12/2021  PCP:  Venia Carbon, MD   [facility] Providers Cardiologist:  Mertie Moores, MD   Patient Profile:   Leon Estes is a 88 y.o. male with a history of CAD s/p stenting to RCA in 1997, paroxysmal atrial fibrillation not on anticoagulation, hypertension, hyperlipidemia, CKD stage III, chronic anemia, COPD, BPH, and recurrent C. Diff colitis who is being seen for the evaluation of CHF at the request of Dr. Tacy Learn.  History of Present Illness:   Leon Estes old male with the above history who is followed by Dr. Acie Fredrickson.  He has a long history of CAD with remote history of MI in 1997.  He reportedly had 2 stents placed to his RCA by Dr. Glade Lloyd at that time.  I do not see any repeat ischemic evaluation since then.  He has done well from a cardiac standpoint over the years.    He had a prolonged hospitalization in 12/2020 for severe sepsis secondary to [name].  Hospitalization was complicated by AKI, multiple electrolyte abnormalities, acute metabolic encephalopathy, delirium, and atrial fibrillation.  Echo during admission showed LVEF of 55 to 60% with normal wall motion and mild LVH.  Diastolic dysfunction was indeterminate due to atrial fibrillation.  He was not started on anticoagulation due to risk of bleeding and fall risk. He was readmitted in 04/2021 with acute metabolic encephalopathy, recurrent [name], and AKI from dehydration.  Brain MRI was unremarkable at that time.  Neurology was consulted and it was concern for underlying dementia.  Kidney function improved with IV fluids.  He was readmitted in 07/2021 again with acute metabolic encephalopathy and acute diarrhea with AKI.  Neurology was consulted again and reassess concern for underlying dementia.  Outpatient neuropsychiatric testing was recommended.  C. difficile  testing was negative and etiology of diarrhea was unclear.  Renal function again improved with IV fluids.  Patient presented to the ED on 10/09/2021 via EMS for fever, diarrhea, and altered mental status.  He was febrile on arrival with temp of 102.8 degrees and he was tachycardic.  Blood was positive for C. difficile.  He was admitted with sepsis secondary to recurrent C. difficile.  He was given IV for fluids, NG tube was placed, and he was started on Dificid.  Chest CT was also concerning for aspiration pneumonia and he was started on Ceftriaxone.  Echo showed LVEF of 30% with global hypokinesis and moderate LVH.  RV moderately enlarged with moderately reduced systolic function but PASP normal.  Cardiology consulted for further evaluation of new CHF.  At the time of this evaluation, patient resting comfortably in no acute distress. He is currently in rate controlled atrial fibrillation. He is somewhat somnolent but will wake up to answer questions. His mental status has improved. He was able to tell me his full name, date of birth, and place (name of hospital, city, and state) but could not remember what brought him in. No family is at bedside. It is difficult to understand him at time but it sounds like he may be having some shortness of breath with activity. No shortness of breath at rest. He denies any orthopnea or PND. No lower extremity edema. He denies any chest pain. No significant palpitations, lightheadedness, or dizziness. No abnormal bleeding in urine or stools.  Past Medical History:  Diagnosis Date   Asthma  BPH (benign prostatic hypertrophy)    Cataract    Chronic renal disease, stage III (HCC)    Chronic sinusitis    COPD (chronic obstructive pulmonary disease) (HCC)    Coronary artery disease    2 stents   Gall stones    Hearing loss    DOES NOT WEAR HEARING AIDS   High cholesterol    Hypertension    Lactic acid acidosis 10/12/2021   Macular degeneration    right eye    Septic shock ([place]) 10/10/2021    Past Surgical History:  Procedure Laterality Date   APPENDECTOMY     CORONARY ANGIOPLASTY WITH STENT PLACEMENT  1998   2 stents   TRANSURETHRAL RESECTION OF PROSTATE       Home Medications:  Prior to Admission medications   Medication Sig Start Date End Date Taking? Authorizing Provider  acetaminophen (TYLENOL) 500 MG tablet Take 1,000 mg by mouth every 6 (six) hours as needed for moderate pain or headache.   Yes [provider]  budesonide-formoterol (SYMBICORT) 160-4.5 MCG/ACT inhaler INHALE 2 PUFFS INTO THE LUNGS TWICE DAILY Patient taking differently: 2 puffs in the morning and at bedtime. 08/07/21  Yes Venia Carbon, MD  gabapentin (NEURONTIN) 400 MG capsule Take 1 capsule (400 mg total) by mouth at bedtime. 09/25/21  Yes Copland, Frederico Hamman, MD  Multiple Vitamin (MULTIVITAMIN WITH MINERALS) TABS tablet Take 1 tablet by mouth daily. 01/23/21  Yes Georgette Shell, MD  Omega-3 Fatty Acids (FISH OIL) 1000 MG CAPS Take 1,000 mg by mouth daily.   Yes [provider]  omeprazole (PRILOSEC) 20 MG capsule Take 1 capsule (20 mg total) by mouth 2 (two) times daily before a meal. On an empty stomach 09/28/21  Yes Viviana Simpler I, MD  potassium chloride (KLOR-CON) 10 MEQ tablet Take 1 tablet (10 mEq total) by mouth daily. 03/15/21  Yes Venia Carbon, MD  Probiotic Product (PROBIOTIC DAILY PO) Take 1 capsule by mouth daily.   Yes [provider]  QUEtiapine (SEROQUEL) 25 MG tablet Take 1 tablet (25 mg total) by mouth at bedtime. 08/18/21  Yes Venia Carbon, MD  simvastatin (ZOCOR) 40 MG tablet Take 40 mg by mouth at bedtime.   Yes [provider]  tamsulosin (FLOMAX) 0.4 MG CAPS capsule TAKE 1 CAPSULE BY MOUTH ONCE DAILY Patient taking differently: 0.4 mg daily after supper. 08/07/21  Yes Venia Carbon, MD  traMADol (ULTRAM) 50 MG tablet Take 1 tablet (50 mg total) by mouth daily as needed. TAKE 1 TABLET BY MOUTH ONCE  A DAY AS NEEDED FOR PAIN Patient taking differently: Take 50 mg by mouth daily as needed (pain). 08/18/21  Yes Venia Carbon, MD  triamcinolone cream (KENALOG) 0.5 % Apply 1 application topically daily as needed (rash). 09/07/21  Yes [provider]  vancomycin (VANCOCIN) 125 MG capsule Take 1 capsule (125 mg total) by mouth 4 (four) times daily. For 2 weeks. Then twice a day for 1 week, then daily for 1 week. Finally, continue it every other day for 8 weeks Patient not taking: Reported on 10/11/2021 07/20/21   Venia Carbon, MD    Inpatient Medications: Scheduled Meds:  Chlorhexidine Gluconate Cloth  6 each Topical Q0600   fluticasone furoate-vilanterol  1 puff Inhalation Daily   furosemide  40 mg Intravenous BID   heparin  5,000 Units Subcutaneous Q8H   mouth rinse  15 mL Mouth Rinse BID   mupirocin ointment  1 application Nasal BID  potassium chloride  40 mEq Oral Once   QUEtiapine  25 mg Oral QHS   sodium chloride flush  10-40 mL Intracatheter Q12H   tamsulosin  0.4 mg Oral Daily   vancomycin  500 mg Oral Q6H   Continuous Infusions:  cefTRIAXone (ROCEPHIN)  IV 2 g (10/12/21 1414)   metronidazole 500 mg (10/12/21 1253)   potassium chloride     PRN Meds: acetaminophen **OR** acetaminophen, sodium chloride flush  Allergies:    Allergies  Allergen Reactions   Benzalkonium Chloride     "Benzalkonium chloride is a quaternary ammonium antiseptic and disinfectant with actions and uses similar to those of other cationic surfactants. It is also used as an antimicrobial preservative for pharmaceutical products."   Augmentin [Amoxicillin-Pot Clavulanate] Rash   Bacitracin-Polymyxin B Swelling and Rash   Neosporin [Neomycin-Bacitracin Zn-Polymyx] Rash   Sulfamethoxazole-Trimethoprim Rash   Terbinafine And Related Rash    Social History:   Social History   Socioeconomic History   Marital status: Married    Spouse name: Not on file   Number of children: 4   Years of  education: Not on file   Highest education level: Not on file  Occupational History   Occupation: Drove truck and concrete work  Tobacco Use   Smoking status: Former    Types: Cigarettes    Quit date: 08/13/2005    Years since quitting: 16.1    Passive exposure: Past   Smokeless tobacco: Current    Types: Chew   Tobacco comments:    discussed stopping this (only once in a while)  Vaping Use   Vaping Use: Never used  Substance and Sexual Activity   Alcohol use: No   Drug use: No   Sexual activity: Not on file  Other Topics Concern   Not on file  Social History Narrative   No living will   Wife, then [place] daughter should make decisions   Would accept resuscitation   Would probably accept tube feeds   Social Determinants of Health   Financial Resource Strain: Not on file  Food Insecurity: Not on file  Transportation Needs: Not on file  Physical Activity: Not on file  Stress: Not on file  Social Connections: Not on file  Intimate Partner Violence: Not on file    Family History:   Family History  Problem Relation Age of Onset   Diabetes Father    Heart disease Brother      ROS:  Please see the history of present illness.  Difficult to obtain full detailed ROS due to mental status.  Physical Exam/Data:   Vitals:   10/12/21 1221 10/12/21 1300 10/12/21 1400 10/12/21 1533  BP:  128/71 (!) 121/109   Pulse:  85    Resp:  18 18   Temp: 98.1 F (36.7 C)   97.8 F (36.6 C)  TempSrc: Oral   Axillary  SpO2:  100%    Weight:        Intake/Output Summary (Last 24 hours) at 10/12/2021 1645 Last data filed at 10/12/2021 1419 Gross per 24 hour  Intake 610 ml  Output 2945 ml  Net -2335 ml   Last 3 Weights 10/12/2021 10/11/2021 10/11/2021  Weight (lbs) 149 lb 11.1 oz 152 lb 5.4 oz 152 lb 5.4 oz  Weight (kg) 67.9 kg 69.1 kg 69.1 kg     Body mass index is 23.45 kg/m.  General: 88 y.o. Caucasian male resting comfortably in no acute distress. HEENT: Normocephalic and  atraumatic. Sclera clear.  Neck: Supple. JVD mildly elevated.  Heart: Irregular rhythm with normal rate. No murmurs, gallops, or rubs appreciated. Radial pulses 2+ and equal bilaterally. Lungs: No increased work of breathing. Clear to ausculation anteriorly. No wheezes, rhonchi, or rales.  Abdomen: Soft, non-distended, and non-tender to palpation. Bowel sounds present. Extremities: No lower extremity edema.    Skin: Slightly cool on exam.  Neuro: Alert and oriented x3. No focal deficits. Psych: Normal affect. Responds appropriately.   EKG:  The EKG was personally reviewed and demonstrates:   - Initial EKG on 10/09/2021: Sinus tachycardia, rate 128 bpm, with RBBB, PVCs/PACs, and diffuse ST depression. - Repeat EKG on 10/10/2021: Atrial fibrillation, rate 70 bpm, with right bundle branch block with, PVC, but improved ST depressions.  Telemetry:  Telemetry was personally reviewed and demonstrates:  Atrial fibrillation with rates in the 60s to 80s. Frequent PVCs (sometimes in bigeminy pattern) and multiple short runs of NSVT (longest run being 9 beats).   Relevant CV Studies:  Echocardiogram 10/10/2021: Impressions:  1. Left ventricular ejection fraction, by estimation, is 30%. The left  ventricle has moderately decreased function. The left ventricle  demonstrates global hypokinesis. There is moderate left ventricular  hypertrophy. Left ventricular diastolic parameters   are indeterminate.   2. Right ventricular systolic function is moderately reduced. The right  ventricular size is moderately enlarged. There is normal pulmonary artery  systolic pressure.   3. Left atrial size was moderately dilated.   4. Right atrial size was severely dilated.   5. The mitral valve is normal in structure. No evidence of mitral valve  regurgitation. No evidence of mitral stenosis.   6. The aortic valve is tricuspid. Aortic valve regurgitation is not  visualized. Aortic valve sclerosis is present, with no  evidence of aortic  valve stenosis.   7. The inferior vena cava is dilated in size with <50% respiratory  variability, suggesting right atrial pressure of 15 mmHg.   Comparison(s): There is new biventricular dysfunction from prior study.  Working to notify team.   Laboratory Data:  High Sensitivity Troponin:  No results for input(s): TROPONINIHS in the last 720 hours.   Chemistry Recent Labs  Lab 10/09/21 1931 10/09/21 1949 10/10/21 0234 10/10/21 0444 10/11/21 0102 10/11/21 0641 10/12/21 0418  NA 134*   < > 137  --  134* 135 137  K 3.4*   < > 3.4*  --  4.8 4.7 3.4*  CL 105   < > 108  --  105  --  105  CO2 20*  --  17*  --  16*  --  18*  GLUCOSE 102*   < > 75  --  85  --  92  BUN 24*   < > 28*  --  35*  --  34*  CREATININE 1.32*   < > 1.72*  --  2.01*  --  1.56*  CALCIUM 8.2*  --  7.5*  --  7.8*  --  8.0*  MG 1.5*  --   --  1.5*  --   --  2.4  GFRNONAA 52*  --  38*  --  31*  --  42*  ANIONGAP 9  --  12  --  13  --  14   < > = values in this interval not displayed.    Recent Labs  Lab 10/10/21 0234 10/11/21 0102 10/12/21 0418  PROT 4.4* 5.0* 5.2*  ALBUMIN 2.0* 2.5* 2.4*  AST 40 77* 94*  ALT 73* 86* 119*  ALKPHOS 49 61 70  BILITOT 0.9 0.8 0.6   Lipids No results for input(s): CHOL, TRIG, HDL, LABVLDL, LDLCALC, CHOLHDL in the last 168 hours.  Hematology Recent Labs  Lab 10/10/21 0234 10/11/21 0102 10/11/21 0641 10/12/21 0418  WBC 8.5 11.1*  --  15.9*  RBC 3.30* 3.26*  --  3.07*  HGB 8.7* 8.5* 8.5* 7.9*  HCT 28.0* 25.7* 25.0* 24.2*  MCV 84.8 78.8*  --  78.8*  MCH 26.4 26.1  --  25.7*  MCHC 31.1 33.1  --  32.6  RDW 17.2* 17.2*  --  17.1*  PLT 197 181  --  170   Thyroid No results for input(s): TSH, FREET4 in the last 168 hours.  BNPNo results for input(s): BNP, PROBNP in the last 168 hours.  DDimer No results for input(s): DDIMER in the last 168 hours.   Radiology/Studies:  CT HEAD WO CONTRAST (5MM)  Result Date: 10/09/2021 CLINICAL DATA:  Mental  status change, known schwannoma. EXAM: CT HEAD WITHOUT CONTRAST TECHNIQUE: Contiguous axial images were obtained from the base of the skull through the vertex without intravenous contrast. RADIATION DOSE REDUCTION: This exam was performed according to the departmental dose-optimization program which includes automated exposure control, adjustment of the mA and/or kV according to patient size and/or use of iterative reconstruction technique. COMPARISON:  None. FINDINGS: Brain: No acute intracranial hemorrhage or midline shift. There is redemonstration of a heterogeneous left CP angle mass with mild mass effect on the left cerebral hemisphere. No extra-axial fluid collection. Mild atrophy is noted. Subcortical and periventricular white matter hypodensities are present bilaterally. There is no hydrocephalus. Vascular: Atherosclerotic calcification of the left vertebral artery and carotid siphons. No hyperdense vessel. Skull: Normal. Negative for fracture or focal lesion. Sinuses/Orbits: Mucosal thickening is present in the right maxillary sinus, left sphenoid sinus, and ethmoid air cells bilaterally. There is evidence of prior ocular surgery bilaterally. Other: None. IMPRESSION: 1. No acute intracranial process. 2. Atrophy with chronic microvascular ischemic changes. 3. Stable left CP angle mass. Electronically Signed   By: Brett Fairy M.D.   On: 10/09/2021 21:38   CT Chest Wo Contrast  Result Date: 10/10/2021 CLINICAL DATA:  Respiratory illness, nondiagnostic x-ray. EXAM: CT CHEST WITHOUT CONTRAST TECHNIQUE: Multidetector CT imaging of the chest was performed following the standard protocol without IV contrast. RADIATION DOSE REDUCTION: This exam was performed according to the departmental dose-optimization program which includes automated exposure control, adjustment of the mA and/or kV according to patient size and/or use of iterative reconstruction technique. COMPARISON:  10/09/2021. FINDINGS: Cardiovascular:  The heart is enlarged and there is a trace pericardial effusion. Multi-vessel coronary artery calcifications are noted. There is atherosclerotic calcification of the aorta without evidence of aneurysm. The pulmonary trunk is normal in caliber. Mediastinum/Nodes: A few prominent lymph nodes are noted in the paratracheal space on the right measuring up to 1 cm in short axis diameter. Evaluation of the hila is limited due to lack of IV contrast. No axillary lymphadenopathy. The thyroid gland and trachea are within normal limits. A hyperdense region is present in the posterior mid esophagus, possibly representing ingested debris. There are calcifications in the mid esophagus, unchanged from the previous exam. Lungs/Pleura: Apical pleural thickening is noted bilaterally. Emphysematous changes are noted in the lungs. Mild atelectasis or infiltrate is noted at the left lung base. Bilateral lower lobe bronchiectasis is noted with mild consolidation in the right lower lobe. No effusion or pneumothorax. Upper Abdomen: No acute abnormality. Musculoskeletal: No acute osseous  abnormality. IMPRESSION: 1. Bilateral lower lobe bronchiectasis with patchy consolidation in the right lower lobe, concerning for pneumonia. 2. Mild atelectasis or infiltrate at the left lung base. 3. Emphysematous changes in lungs with apical pleural thickening bilaterally. 4. Aortic atherosclerosis with multi-vessel and coronary artery calcifications. Electronically Signed   By: Brett Fairy M.D.   On: 10/10/2021 05:02   CT ABDOMEN PELVIS W CONTRAST  Result Date: 10/10/2021 CLINICAL DATA:  88 year old male with acute abdominal pain. EXAM: CT ABDOMEN AND PELVIS WITH CONTRAST TECHNIQUE: Multidetector CT imaging of the abdomen and pelvis was performed using the standard protocol following bolus administration of intravenous contrast. RADIATION DOSE REDUCTION: This exam was performed according to the departmental dose-optimization program which includes  automated exposure control, adjustment of the mA and/or kV according to patient size and/or use of iterative reconstruction technique. CONTRAST:  24mL OMNIPAQUE IOHEXOL 350 MG/ML SOLN COMPARISON:  Noncontrast chest CT today reported separately. Noncontrast CT Abdomen and Pelvis 07/25/2021 and earlier. FINDINGS: Lower chest: Chest CT reported separately today. Hepatobiliary: Liver and gallbladder appear stable since 2014. Pancreas: Partially atrophied, otherwise negative. Spleen: Negative. Adrenals/Urinary Tract: Negative adrenal glands. Nonobstructed kidneys with nonspecific bilateral perinephric stranding not significantly changed from last year. Chronic left upper pole renal cyst was present in 2014. Symmetric renal enhancement, but no contrast excretion on the delayed images. No hydroureter. Diminutive bladder. Stomach/Bowel: Rectal tube in place. Generalized abnormal colonic wall thickening throughout the abdomen and pelvis. Underlying air and fluid containing colon to the rectum. Large bowel mucosal hyperenhancement most pronounced from the splenic flexure distally, but all colonic segments appear inflamed. Appendix not identified, may be diminutive or absent. Nondilated small bowel. Questionable small bowel mucosal hyperenhancement, including in the terminal ileum which is decompressed. No free air or free fluid. Stomach appears unremarkable. Vascular/Lymphatic: Extensive Aortoiliac calcified atherosclerosis. Major arterial structures in the abdomen and pelvis remain patent. Portal venous system is patent. No lymphadenopathy identified. Reproductive: Prostatomegaly redemonstrated. Other: No pelvic free fluid. Musculoskeletal: Chronic L5 pars fractures and grade 2 L5-S1 spondylolisthesis is stable. Chronic lumbar endplate Schmorl's nodes elsewhere. No acute osseous abnormality identified. IMPRESSION: 1. Acute Pancolitis, most likely infectious. Questionable small bowel enteritis also. No bowel obstruction, free  air, or free fluid. 2. No obstructive uropathy, but absent renal contrast excretion on the delayed images raises the possibility of acute intrinsic renal disease such as ATN. 3. Chest CT reported separately. 4. Aortic Atherosclerosis (ICD10-I70.0). Electronically Signed   By: Genevie Ann M.D.   On: 10/10/2021 04:59   DG CHEST PORT 1 VIEW  Result Date: 10/10/2021 CLINICAL DATA:  PICC line placement EXAM: PORTABLE CHEST 1 VIEW COMPARISON:  Previous studies including the examination of 10/09/2018 FINDINGS: Transverse diameter of heart is increased. There are no signs of pulmonary edema or focal pulmonary consolidation. There is interval placement of central venous catheter through the left upper extremity with its tip in the superior vena cava. Left lateral CP angle is not included in its entirety. There is no pneumothorax. IMPRESSION: Tip of central venous catheter is seen in the superior vena cava. Cardiomegaly. There are no new infiltrates or signs of pulmonary edema. There is no pneumothorax. Electronically Signed   By: Elmer Picker M.D.   On: 10/10/2021 16:16   DG Chest Port 1 View  Result Date: 10/09/2021 CLINICAL DATA:  ams EXAM: PORTABLE CHEST 1 VIEW.  Patient is slightly rotated. COMPARISON:  Chest x-ray 07/24/2021 FINDINGS: The heart and mediastinal contours are unchanged. Aortic calcification. Slight haziness of the  right lower lobe likely due to patient rotation. Left base atelectasis. No focal consolidation. No pulmonary edema. No pleural effusion. No pneumothorax. No acute osseous abnormality. IMPRESSION: 1. No definite acute cardiopulmonary abnormality. 2.  Aortic Atherosclerosis (ICD10-I70.0). Electronically Signed   By: Iven Finn M.D.   On: 10/09/2021 22:27   ECHOCARDIOGRAM COMPLETE  Result Date: 10/10/2021    ECHOCARDIOGRAM REPORT   Patient Name:   Leon Estes Date of Exam: 10/10/2021 Medical Rec #:  [id number]        Height:       67.0 in Accession #:    [id number]        Weight:       148.4 lb Date of Birth:  October 12, 1930         BSA:          1.781 m Patient Age:    61 years         BP:           88/40 mmHg Patient Gender: M                HR:           76 bpm. Exam Location:  Inpatient Procedure: 2D Echo, Cardiac Doppler and Color Doppler Indications:    SHOCK  History:        Patient has prior history of Echocardiogram examinations, most                 recent 12/30/2020. CAD, COPD; Risk Factors:Hypertension and                 Dyslipidemia.  Sonographer:    Beryle Beams Referring Phys: [id number] ELISE L STEPHENSON IMPRESSIONS  1. Left ventricular ejection fraction, by estimation, is 30%. The left ventricle has moderately decreased function. The left ventricle demonstrates global hypokinesis. There is moderate left ventricular hypertrophy. Left ventricular diastolic parameters  are indeterminate.  2. Right ventricular systolic function is moderately reduced. The right ventricular size is moderately enlarged. There is normal pulmonary artery systolic pressure.  3. Left atrial size was moderately dilated.  4. Right atrial size was severely dilated.  5. The mitral valve is normal in structure. No evidence of mitral valve regurgitation. No evidence of mitral stenosis.  6. The aortic valve is tricuspid. Aortic valve regurgitation is not visualized. Aortic valve sclerosis is present, with no evidence of aortic valve stenosis.  7. The inferior vena cava is dilated in size with <50% respiratory variability, suggesting right atrial pressure of 15 mmHg. Comparison(s): There is new biventricular dysfunction from prior study. Working to notify team. FINDINGS  Left Ventricle: Left ventricular ejection fraction, by estimation, is 30%. The left ventricle has moderately decreased function. The left ventricle demonstrates global hypokinesis. The left ventricular internal cavity size was normal in size. There is moderate left ventricular hypertrophy. Left ventricular diastolic parameters are  indeterminate. Right Ventricle: The right ventricular size is moderately enlarged. No increase in right ventricular wall thickness. Right ventricular systolic function is moderately reduced. There is normal pulmonary artery systolic pressure. The tricuspid regurgitant velocity is 2.28 m/s, and with an assumed right atrial pressure of 15 mmHg, the estimated right ventricular systolic pressure is 63.3 mmHg. Left Atrium: Left atrial size was moderately dilated. Right Atrium: Right atrial size was severely dilated. Pericardium: There is no evidence of pericardial effusion. Mitral Valve: The mitral valve is normal in structure. No evidence of mitral valve regurgitation. No evidence of mitral valve stenosis. Tricuspid Valve: The tricuspid  valve is normal in structure. Tricuspid valve regurgitation is mild . No evidence of tricuspid stenosis. Aortic Valve: The aortic valve is tricuspid. Aortic valve regurgitation is not visualized. Aortic valve sclerosis is present, with no evidence of aortic valve stenosis. Aortic valve mean gradient measures 3.0 mmHg. Aortic valve peak gradient measures 6.0  mmHg. Aortic valve area, by VTI measures 1.13 cm. Pulmonic Valve: The pulmonic valve was not well visualized. Pulmonic valve regurgitation is not visualized. No evidence of pulmonic stenosis. Aorta: The aortic root and ascending aorta are structurally normal, with no evidence of dilitation. Venous: The inferior vena cava is dilated in size with less than 50% respiratory variability, suggesting right atrial pressure of 15 mmHg. IAS/Shunts: No atrial level shunt detected by color flow Doppler.  LEFT VENTRICLE PLAX 2D LVIDd:         3.80 cm LVIDs:         2.70 cm LV PW:         1.60 cm LV IVS:        1.30 cm LVOT diam:     1.90 cm LV SV:         18 LV SV Index:   10 LVOT Area:     2.84 cm  LV Volumes (MOD) LV vol d, MOD A2C: 74.4 ml LV vol d, MOD A4C: 61.4 ml LV vol s, MOD A2C: 51.1 ml LV vol s, MOD A4C: 40.0 ml LV SV MOD A2C:      23.3 ml LV SV MOD A4C:     61.4 ml LV SV MOD BP:      22.4 ml RIGHT VENTRICLE            IVC RV Basal diam:  4.10 cm    IVC diam: 2.20 cm RV Mid diam:    3.80 cm RV S prime:     9.46 cm/s TAPSE (M-mode): 1.4 cm LEFT ATRIUM             Index        RIGHT ATRIUM           Index LA diam:        4.50 cm 2.53 cm/m   RA Area:     28.80 cm LA Vol (A2C):   72.9 ml 40.93 ml/m  RA Volume:   103.00 ml 57.83 ml/m LA Vol (A4C):   72.6 ml 40.76 ml/m LA Biplane Vol: 73.2 ml 41.10 ml/m  AORTIC VALVE                    PULMONIC VALVE AV Area (Vmax):    1.09 cm     PV Vmax:       0.41 m/s AV Area (Vmean):   1.06 cm     PV Vmean:      26.200 cm/s AV Area (VTI):     1.13 cm     PV VTI:        0.056 m AV Vmax:           122.00 cm/s  PV Peak grad:  0.7 mmHg AV Vmean:          81.400 cm/s  PV Mean grad:  0.0 mmHg AV VTI:            0.160 m AV Peak Grad:      6.0 mmHg AV Mean Grad:      3.0 mmHg LVOT Vmax:         46.70 cm/s LVOT Vmean:  30.400 cm/s LVOT VTI:          0.064 m LVOT/AV VTI ratio: 0.40  AORTA Ao Root diam: 3.20 cm Ao Asc diam:  2.90 cm MR Peak grad: 27.7 mmHg   TRICUSPID VALVE MR Mean grad: 21.0 mmHg   TR Peak grad:   20.8 mmHg MR Vmax:      263.00 cm/s TR Mean grad:   14.0 mmHg MR Vmean:     222.0 cm/s  TR Vmax:        228.00 cm/s                           TR Vmean:       180.0 cm/s                            SHUNTS                           Systemic VTI:  0.06 m                           Systemic Diam: 1.90 cm Rudean Haskell MD Electronically signed by Rudean Haskell MD Signature Date/Time: 10/10/2021/1:47:12 PM    Final      Assessment and Plan:   New Onset Systolic CHF Patient presented with fever, diarrhea, and altered mental status and was admitted for sepsis secondary to recurrent C. Diff colitis. Echo this admission showed LVEF of 30% with global hypokinesis and moderate LVH.  RV moderately enlarged with moderately reduced systolic function but PASP normal.  She was started on patient  was started on IV Lasix with good urinary response but still net +1.9 L this admission. - Slightly cool on exam and JVD elevated although no significant peripheral edema.  - Will increase IV Lasix to 40mg  twice daily. He will get another dose tonight. - Will hold off on beta-blocker for now until he is more compensated from a CHF standpoint. - No ACE/ARB/ARNI or MRA due to renal function. May be able to add ARB if renal function returns to baseline and BP allows. - Continue to monitor daily weights, strict I/Os, and renal function. - Drop in EF may be secondary to sepsis. However, cannot rule out progressive CAD. Given advanced age, possible underlying dementia, and other comorbidities, he is not a good cardiac cath candidate. Would continue to treat medically. Cab consider repeat Echo in to reassess EF function after starting GDMT.  Paroxysmal Atrial Fibrillation Presented in sinus tachycardia with frequent ectopy but now in rate controlled atrial fibrillation.  - Rate controlled with rates in the 60s to 80s. - Potassium 3.4 today. Being repleted. - Magnesium 2.4 today. - Holding off on beta-blocker for now until more compensated from a CHF standpoint. - CHA2DS2-VASc = 5 (CAD, CHF, HTN, age x2). Previously not felt to be a anticoagulation candidate due to high fall risk. Also has chronic anemia with hemoglobin of 7.9 today. Discussed with MD - will not add anticoagulation at this time.   Non-Sustained VT Patient having frequent PVCs and short runs of NSVT, longest run being 9 beats. - Potassium 3.4 today. Being repleted. - Magnesium 2.4 today. - No beta-blocker now in setting of decompensated CHF (slightly cool on exam). Will hopefully be able to add low dose Coreg prior to discharge.  CAD S/p remote stenting to RCA  in 1997. - No chest pain.  - Previously on aspirin but this was stopped due to anemia. - Continue statin.  Hypertension History of hypertension but hypotensive this  admission in the setting of sepsis.  Required Levophed but this was able to be stopped on 10/10/2021. - BP stable.  - Continue to monitor closely.  Acute on Chronic CKD Stage II Creatinine 1.32 on admission and peaked at 2.01 on 10/11/2021.  Baseline around 0.7 to 0.9. - Improving at 1.56 today.  - Continue to avoid nephrotoxic agents.  - Continue to monitor closely.  Hypokalemia Hyperkalemia Patient has had both hypokalemia and hyperkalemia this admission. - Potassium 3.4 today. Being supplemented by primary team. Will give additional supplement given we are increasing Lasix. - Continue to monitor closely.  Otherwise, per primary team: - Sepsis secondary to recurrent C. Diff colitis - Acute metabolic encephalopathy - Aspiration pneumonia - COPD - Chronic anemia   Risk Assessment/Risk Scores:   New York Heart Association (NYHA) Functional Class NYHA Class III  CHA2DS2-VASc Score = 5  This indicates a 7.2% annual risk of stroke. The patient's score is based upon: CHF History: 1 HTN History: 1 Diabetes History: 0 Stroke History: 0 Vascular Disease History: 1 Age Score: 2 Gender Score: 0     For questions or updates, please contact [facility] Please consult [email or web address] for contact info under    Signed, Darreld Mclean, PA-C  10/12/2021 4:45 PM  Patient seen and examined.  Agree with above documentation.  Leon Estes is an 88 year old male with a history of CAD status post RCA stenting in 1997, COPD, paroxysmal atrial fibrillation not on anticoagulation, hypertension, recurrent [name], CKD stage III who we are consulted by Dr. Tacy Learn for evaluation of heart failure.  He has had multiple hospitalizations with sepsis due to [name].  He presented to ED on 10/09/2021 with fever, diarrhea, and confusion.  Found to have [name].  Echocardiogram showed EF 30%.  This was new, echo during admission in May 2022 showed EF 55 to 60%.  He has  been on p.o. vancomycin and IV Flagyl for his C. difficile.  On IV ceftriaxone for treatment of pneumonia.  Was initially requiring pressors, has been weaned off.  He received IV fluids throughout his admission, was found to be volume overloaded yesterday and started on IV Lasix.  On exam, patient is alert and orientedx2, irregular rhythm, normal rate, no murmurs, diminished breath sounds, no lower extremity edema, +JVD.  For his acute combined heart failure, unclear etiology.  Could be secondary to sepsis.  He is not a good candidate for invasive evaluation to rule out obstructive CAD given his age and comorbidities.  Would plan medical management.  Appears hypervolemic on exam, will increase Lasix to 40 mg twice daily.  Hold off on beta-blocker until more compensated from heart failure standpoint.  Hold off on ACE/ARB/Arni given renal function.  He is currently in rate controlled atrial fibrillation.  Not a good anticoagulation candidate given his fall risk and anemia.  Donato Heinz, MD

## 2021-10-12 NOTE — Progress Notes (Signed)
RN aware of DC central line  

## 2021-10-12 NOTE — Progress Notes (Addendum)
? ?NAME:  Leon Estes, MRN:  [id number], DOB:  [birth year], LOS: 3 ?ADMISSION DATE:  10/09/2021, CONSULTATION DATE:  2/28 ?REFERRING MD:  Dr. Tonie Griffith, CHIEF COMPLAINT:  Hypotension  ? ?History of Present Illness:  ?Patient is encephalopathic and/or intubated. Therefore history has been obtained from chart review.  88 year old male with PMH as below, which is significant for CKD III, COPD, CAD, and HTN. He presented to [facility] ED 2/27 with complaints of AMS, fever, and diarrhea x  24 hours. Upon arrival to the ED he was found to be febrile and was not entirely oriented. There was concern for sepsis. He was started on antibiotics and given volume. Stool testing was positive for c. Difficile antigen and toxin.  He was admitted to the hospitalist service for treatment of C. Dif colitis. However, prior to being moved from the ED he developed hypotension with MAPs into the 50s refractory to an additional liter bolus. He was started on norepinephrine and PCCM was asked to evaluate the patient fo transfer to the ICU.  ? ?Pertinent  Medical History  ? has a past medical history of Asthma, BPH (benign prostatic hypertrophy), Cataract, Chronic renal disease, stage III ([place]), Chronic sinusitis, COPD (chronic obstructive pulmonary disease) ([place]), Coronary artery disease, Gall stones, Hearing loss, High cholesterol, Hypertension, Lactic acid acidosis (10/12/2021), Macular degeneration, and Septic shock ([place]) (10/10/2021). ? ?Significant Hospital Events: ?Including procedures, antibiotic start and stop dates in addition to other pertinent events   ?2/27: Admitted to [facility]  ?2/28: Transferred to ICU for hypotension. Levophed initiated. CT of abdomen and pelvis reveal acute pancolitis most likely infectious. Small bowel enteritis also.  ?3/1 off norepi. But creatinine rising ?3/2: Ready for transfer out of intensive care stop date placed for ceftriaxone, added back at bedtime Seroquel ?Interim History / Subjective:  ?No distress  overnight ? ?Objective   ?Blood pressure 138/71, pulse (Abnormal) 109, temperature 97.6 ?F (36.4 ?C), temperature source Oral, resp. rate 14, weight 67.9 kg, SpO2 93 %. ?   ?   ? ?Intake/Output Summary (Last 24 hours) at 10/12/2021 1040 ?Last data filed at 10/12/2021 0844 ?Gross per 24 hour  ?Intake 610 ml  ?Output 3545 ml  ?Net -2935 ml  ? ?Filed Weights  ? 10/11/21 0045 10/11/21 0357 10/12/21 0415  ?Weight: 69.1 kg 69.1 kg 67.9 kg  ? ?Examination:  ?General: This is a 88 year old male patient resting in bed he is in no acute distress this morning ?HEENT normocephalic atraumatic no jugular venous distention appreciated ?Pulmonary: Clear to auscultation no accessory use, breathing comfortably on room air ?Cardiac: Regular rate and rhythm without murmur rub or gallop normal sinus on telemetry ?Abdomen: Soft nontender still has some liquid stools tolerating full liquid diet ready to advance ?Extremities: Warm dry brisk capillary refill ?Neuro: Awake, oriented x3 with some difficulty with recall, moving all extremity, generalized weakness. ? ?Resolved Hospital Problem list   ? ?Shock septic/hypovolemic ?Assessment & Plan:  ? ?Principal Problem: ?  Recurrent Clostridioides difficile diarrhea ?Active Problems: ?  Pneumonia ?  COPD (chronic obstructive pulmonary disease) ([place]) ?  CAD (coronary artery disease) ?  Hyperlipidemia ?  BPH (benign prostatic hyperplasia) ?  Neoplasm of brain causing mass effect on adjacent structures ([place]) ?  GERD (gastroesophageal reflux disease) ?  Acute renal failure superimposed on stage 3b chronic kidney disease ([place]) ?  Acute metabolic encephalopathy ?  Hypomagnesemia ?  Prolonged QT interval ?  Metabolic acidosis, increased anion gap (IAG) ?  Abnormal LFTs ?  Systolic heart failure ([place]) ?  A-fib ([place]) ? ? ? ?Sepsis (resolved) 2/2 Recurrent C. Diff Colitis +/- RLL PNA ? Aspiration  ?-shock resolved as of 3/1 ?Plan ?Day 4 flagyl, day 2 vanc (oral); will need to continue oral vanc x 14 d after  completed ceftriaxone ?Cxr reviewed no clear infiltrate. Dc ctx after 5 total days ?Advance diet mech soft ?Asp precautions  ? ?Acute systolic HF. (EF 14%) w/ prolonged QTc ?New af  ?Plan ?Cont IV lasix  ?Repeat echo 4-6 weeks ?Cards consult to establish f/u  ?Avoid qtc prolonging agents  ?Tele  ?Holding ac for now given anemia ?K> 4 ?Mg > 2 ? ?History of COPD ?Plan ?Cont home bds ? ?Acute septic/metabolic encephalopathy  ?Secondary to sepsis. Improving, has some h/o delirium which improved on low dose seroquel n out-pt setting  ?Plan  ?Supportive care ?Gets Seroquel HS-->resume  ? ?Acute Kidney Injury on CKD stage II ?High anion Gap Metabolic Acidosis  ?lactate was slow to clear. Bicarb improving as is scr, tolerated lasix  ?Plan ?Keep euvolemic  ?Renal dose meds.  ?Am chem  ?Holding his gabapentin  ? ?H/o BPH ?Plan ?Restart tamsulosin  ? ?Fluid and electrolyte imbalance: hypokalemia ?Plan ?Replacing K  ?Repeating Mg as got lasix  ? ?Abnormal LFTs ?LFT continue to rise which suspect there may be liver congestion from new biventricular heart failure ?Plan ?Cont to monitor  ? ?Anemia of critical illness ?Plan ?Trend cbc ?Trigger for xfusion < 7 ? ?HTN ?CAD s/p DES (1997) ?Plan ?Cont tele  ?Holding his zocor w/ his current LFT rise  ? ?Chronic neuropathy  ?Plan ?Holding Neurontin  ? ?Physical deconditioning ?Plan ?PT consult  ? ?Best Practice (right click and "Reselect all SmartList Selections" daily)  ? ?Diet/type: Advance to mech soft  ?DVT prophylaxis: prophylactic heparin  ?GI prophylaxis: PPI ?Lines: N/A ?Foley:  N/A ?Code Status: DNR ?Last date of multidisciplinary goals of care discussion [patient is DNR/DNI] ? ?Erick Colace ACNP-BC ?[place] ?Pager # [phone] OR # [phone] if no answer ? ? ? ?

## 2021-10-13 ENCOUNTER — Other Ambulatory Visit (HOSPITAL_COMMUNITY): Payer: Self-pay

## 2021-10-13 DIAGNOSIS — A0471 Enterocolitis due to Clostridium difficile, recurrent: Secondary | ICD-10-CM

## 2021-10-13 LAB — CBC WITH DIFFERENTIAL/PLATELET
Abs Immature Granulocytes: 0.08 10*3/uL — ABNORMAL HIGH (ref 0.00–0.07)
Basophils Absolute: 0 10*3/uL (ref 0.0–0.1)
Basophils Relative: 0 %
Eosinophils Absolute: 0.1 10*3/uL (ref 0.0–0.5)
Eosinophils Relative: 1 %
HCT: 28.5 % — ABNORMAL LOW (ref 39.0–52.0)
Hemoglobin: 9.3 g/dL — ABNORMAL LOW (ref 13.0–17.0)
Immature Granulocytes: 1 %
Lymphocytes Relative: 9 %
Lymphs Abs: 1.1 10*3/uL (ref 0.7–4.0)
MCH: 26.1 pg (ref 26.0–34.0)
MCHC: 32.6 g/dL (ref 30.0–36.0)
MCV: 79.8 fL — ABNORMAL LOW (ref 80.0–100.0)
Monocytes Absolute: 0.6 10*3/uL (ref 0.1–1.0)
Monocytes Relative: 5 %
Neutro Abs: 9.8 10*3/uL — ABNORMAL HIGH (ref 1.7–7.7)
Neutrophils Relative %: 84 %
Platelets: 169 10*3/uL (ref 150–400)
RBC: 3.57 MIL/uL — ABNORMAL LOW (ref 4.22–5.81)
RDW: 17.2 % — ABNORMAL HIGH (ref 11.5–15.5)
WBC: 11.8 10*3/uL — ABNORMAL HIGH (ref 4.0–10.5)
nRBC: 0 % (ref 0.0–0.2)

## 2021-10-13 LAB — COMPREHENSIVE METABOLIC PANEL
ALT: 111 U/L — ABNORMAL HIGH (ref 0–44)
AST: 62 U/L — ABNORMAL HIGH (ref 15–41)
Albumin: 2.5 g/dL — ABNORMAL LOW (ref 3.5–5.0)
Alkaline Phosphatase: 82 U/L (ref 38–126)
Anion gap: 10 (ref 5–15)
BUN: 32 mg/dL — ABNORMAL HIGH (ref 8–23)
CO2: 15 mmol/L — ABNORMAL LOW (ref 22–32)
Calcium: 8.2 mg/dL — ABNORMAL LOW (ref 8.9–10.3)
Chloride: 111 mmol/L (ref 98–111)
Creatinine, Ser: 1.43 mg/dL — ABNORMAL HIGH (ref 0.61–1.24)
GFR, Estimated: 47 mL/min — ABNORMAL LOW (ref >60)
Glucose, Bld: 101 mg/dL — ABNORMAL HIGH (ref 70–99)
Potassium: 3.7 mmol/L (ref 3.5–5.1)
Sodium: 136 mmol/L (ref 135–145)
Total Bilirubin: UNDETERMINED mg/dL (ref 0.3–1.2)
Total Protein: 5.2 g/dL — ABNORMAL LOW (ref 6.5–8.1)

## 2021-10-13 LAB — GLUCOSE, CAPILLARY: Glucose-Capillary: 64 mg/dL — ABNORMAL LOW (ref 70–99)

## 2021-10-13 LAB — LACTIC ACID, PLASMA
Lactic Acid, Venous: 3.8 mmol/L (ref 0.5–1.9)
Lactic Acid, Venous: 3.5 mmol/L (ref 0.5–1.9)

## 2021-10-13 MED ORDER — POTASSIUM CHLORIDE CRYS ER 20 MEQ PO TBCR
40.0000 meq | EXTENDED_RELEASE_TABLET | Freq: Once | ORAL | Status: AC
  Administered 2021-10-13: 40 meq via ORAL
  Filled 2021-10-13: qty 2

## 2021-10-13 MED ORDER — FUROSEMIDE 40 MG PO TABS
40.0000 mg | ORAL_TABLET | Freq: Every day | ORAL | Status: DC
Start: 2021-10-14 — End: 2021-10-15
  Administered 2021-10-14: 40 mg via ORAL
  Filled 2021-10-13: qty 1

## 2021-10-13 MED ORDER — ASPIRIN EC 81 MG PO TBEC
81.0000 mg | DELAYED_RELEASE_TABLET | Freq: Every day | ORAL | Status: DC
  Administered 2021-10-13 – 2021-10-17 (×5): 81 mg via ORAL
  Filled 2021-10-13 (×5): qty 1

## 2021-10-13 NOTE — Evaluation (Signed)
Physical Therapy Evaluation ?Patient Details ?Name: Leon Estes ?MRN: [id number] ?DOB: Aug 30, 1930 ?Today's Date: 10/13/2021 ? ?History of Present Illness ? 88 y/o male presented to ED on 2/27 for vomiting and diarrhea along with AMS x 1 day. Diagnosed with sepsis secondary to recurrent C diff. Required ICU admission for hypotension. CT abdomen/pelvis revelaed acute pancolitis most likely infectious. PMH: BPH; stage 3 CKD; COPD; CAD; HTN; recent diagnosis of Schwannoma; and recurrent C diff coliitis  ?Clinical Impression ? Patient admitted with the above findings. Patient presents with generalized weakness, impaired balance, and decreased activity tolerance. Family present initially and stating cognition is normal to them. Patient able to perform bed mobility with supervision and sit to stand transfer with minA. Patient ambulated 47' in hallway with RW and min guard for safety. Patient will benefit from skilled PT services during acute stay to address listed deficits. Recommend HHPT at discharge to maximize functional independence and safety.  ?   ? ?Recommendations for follow up therapy are one component of a multi-disciplinary discharge planning process, led by the attending physician.  Recommendations may be updated based on patient status, additional functional criteria and insurance authorization. ? ?Follow Up Recommendations Home health PT ? ?  ?Assistance Recommended at Discharge Frequent or constant Supervision/Assistance  ?Patient can return home with the following ? A little help with walking and/or transfers;A little help with bathing/dressing/bathroom;Assistance with cooking/housework;Direct supervision/assist for medications management;Direct supervision/assist for financial management;Assist for transportation;Help with stairs or ramp for entrance ? ?  ?Equipment Recommendations None recommended by PT (patient owns necessary equipment)  ?Recommendations for Other Services ?    ?  ?Functional Status  Assessment Patient has had a recent decline in their functional status and demonstrates the ability to make significant improvements in function in a reasonable and predictable amount of time.  ? ?  ?Precautions / Restrictions Precautions ?Precautions: Fall ?Precaution Comments: flexiseal ?Restrictions ?Weight Bearing Restrictions: No  ? ?  ? ?Mobility ? Bed Mobility ?Overal bed mobility: Needs Assistance ?Bed Mobility: Supine to Sit, Sit to Supine ?  ?  ?Supine to sit: Supervision ?Sit to supine: Supervision ?  ?General bed mobility comments: supervision for safety but no physical assist required ?  ? ?Transfers ?Overall transfer level: Needs assistance ?Equipment used: Rolling [name] (2 wheels) ?Transfers: Sit to/from Stand ?Sit to Stand: Min assist ?  ?  ?  ?  ?  ?General transfer comment: minA to rise and steady ?  ? ?Ambulation/Gait ?Ambulation/Gait assistance: Min guard ?Gait Distance (Feet): 75 Feet ?Assistive device: Rolling [name] (2 wheels) ?Gait Pattern/deviations: Step-through pattern, Decreased stride length, Trunk flexed ?Gait velocity: decreased ?  ?  ?General Gait Details: min guard for safety, no overt LOB noted but reliant on use of RW ? ?Stairs ?  ?  ?  ?  ?  ? ?Wheelchair Mobility ?  ? ?Modified Rankin (Stroke Patients Only) ?  ? ?  ? ?Balance Overall balance assessment: Needs assistance ?Sitting-balance support: No upper extremity supported, Feet supported ?Sitting balance-Leahy Scale: Good ?  ?  ?Standing balance support: Bilateral upper extremity supported, Reliant on assistive device for balance ?Standing balance-Leahy Scale: Poor ?Standing balance comment: reliant on UE support of RW ?  ?  ?  ?  ?  ?  ?  ?  ?  ?  ?  ?   ? ? ? ?Pertinent Vitals/Pain Pain Assessment ?Pain Assessment: No/denies pain  ? ? ?Home Living Family/patient expects to be discharged to:: Private residence ?Living Arrangements: Spouse/significant  other ?Available Help at Discharge: Family;Available 24 hours/day ?Type of  Home: House ?Home Access: Ramped entrance ?  ?  ?  ?Home Layout: One level ?Home Equipment: Conservation officer, nature (2 wheels);Rollator (4 wheels);Shower seat;Cane - single point ?   ?  ?Prior Function Prior Level of Function : Needs assist ?  ?  ?  ?  ?  ?  ?Mobility Comments: using [name] for the past week per wife ?ADLs Comments: wife assists with bathing, dressing and getting in/out of tub ?  ? ? ?Hand Dominance  ?   ? ?  ?Extremity/Trunk Assessment  ? Upper Extremity Assessment ?Upper Extremity Assessment: Generalized weakness ?  ? ?Lower Extremity Assessment ?Lower Extremity Assessment: Generalized weakness ?  ? ?Cervical / Trunk Assessment ?Cervical / Trunk Assessment: Kyphotic  ?Communication  ? Communication: HOH  ?Cognition Arousal/Alertness: Awake/alert ?Behavior During Therapy: [facility] for tasks assessed/performed ?Overall Cognitive Status: Within Functional Limits for tasks assessed ?  ?  ?  ?  ?  ?  ?  ?  ?  ?  ?  ?  ?  ?  ?  ?  ?General Comments: per family, WFL ?  ?  ? ?  ?General Comments   ? ?  ?Exercises    ? ?Assessment/Plan  ?  ?PT Assessment Patient needs continued PT services  ?PT Problem List Decreased strength;Decreased activity tolerance;Decreased mobility;Decreased coordination;Decreased balance;Decreased knowledge of use of DME ? ?   ?  ?PT Treatment Interventions DME instruction;Gait training;Functional mobility training;Therapeutic activities;Therapeutic exercise;Balance training;Patient/family education   ? ?PT Goals (Current goals can be found in the Care Plan section)  ?Acute Rehab PT Goals ?Patient Stated Goal: to get stronger and get better ?PT Goal Formulation: With patient/family ?Time For Goal Achievement: 10/27/21 ?Potential to Achieve Goals: Good ? ?  ?Frequency Min 3X/week ?  ? ? ?Co-evaluation   ?  ?  ?  ?  ? ? ?  ?AM-PAC PT "6 Clicks" Mobility  ?Outcome Measure Help needed turning from your back to your side while in a flat bed without using bedrails?: A Little ?Help needed moving from  lying on your back to sitting on the side of a flat bed without using bedrails?: A Little ?Help needed moving to and from a bed to a chair (including a wheelchair)?: A Little ?Help needed standing up from a chair using your arms (e.g., wheelchair or bedside chair)?: A Little ?Help needed to walk in hospital room?: A Little ?Help needed climbing 3-5 steps with a railing? : A Little ?6 Click Score: 18 ? ?  ?End of Session   ?Activity Tolerance: Patient tolerated treatment well ?Patient left: in bed;with call bell/phone within reach;with bed alarm set ?Nurse Communication: Mobility status ?PT Visit Diagnosis: Unsteadiness on feet (R26.81);Muscle weakness (generalized) (M62.81);Other abnormalities of gait and mobility (R26.89) ?  ? ?Time: 1337-1410 ?PT Time Calculation (min) (ACUTE ONLY): 33 min ? ? ?Charges:   PT Evaluation ?$PT Eval Moderate Complexity: 1 Mod ?PT Treatments ?$Gait Training: 8-22 mins ?  ?   ? ? ?Sonam Huelsmann A. Gilford Rile, PT, DPT ?Acute Rehabilitation Services ?Pager [phone] ?Office [phone] ? ? ?Lasandra Batley A Edessa Jakubowicz ?10/13/2021, 2:47 PM ? ?

## 2021-10-13 NOTE — TOC Initial Note (Signed)
Transition of Care (TOC) - Initial/Assessment Note  ? ? ?Patient Details  ?Name: Leon Estes ?MRN: [id number] ?Date of Birth: 1930/12/20 ? ?Transition of Care (TOC) CM/SW Contact:    ?Paulene Floor Rebie Peale, LCSWA ?Phone Number: ?10/13/2021, 3:44 PM ? ?Clinical Narrative:                 ?CSW met with the patient's family to complete assessment.  Due to the patient's being disoriented, CSW spoke with the patient's wife at bedside.  The patient's wife reported that the family wanted the patient to go home at discharge.  CSW inquired about home health and the wife declined.  The wife reported that a nurse comes once every few months to check on the patient and "that is enough".  The wife reports that she and her children are able to assist the patient at home.  The patient currently has the following equipment at the home:  shower chair, bed side commode, 2 walkers, cane, and a lift chair.  The wife reports that he has everything he will  need at the home.   ? ?Expected Discharge Plan: Home/Self Care ?Barriers to Discharge: Continued Medical Work up ? ? ?Patient Goals and CMS Choice ?  ?CMS Medicare.gov Compare Post Acute Care list provided to:: Patient Represenative (must comment) ?Choice offered to / list presented to : Spouse ? ?Expected Discharge Plan and Services ?Expected Discharge Plan: Home/Self Care ?  ?  ?  ?Living arrangements for the past 2 months: [place] ?                ?  ?  ?  ?  ?  ?  ?  ?  ?  ?  ? ?Prior Living Arrangements/Services ?Living arrangements for the past 2 months: [place] ?Lives with:: Spouse ?Patient language and need for interpreter reviewed:: Yes ?Do you feel safe going back to the place where you live?: Yes      ?  ?Care giver support system in place?: Yes (comment) ?  ?Criminal Activity/Legal Involvement Pertinent to Current Situation/Hospitalization: No - Comment as needed ? ?Activities of Daily Living ?  ?  ? ?Permission Sought/Granted ?  ?Permission granted to  share information with : Yes, Verbal Permission Granted ?   ?   ?   ?   ? ?Emotional Assessment ?Appearance:: Appears stated age ?  ?  ?Orientation: : Oriented to Self ?Alcohol / Substance Use: Not Applicable ?Psych Involvement: No (comment) ? ?Admission diagnosis:  Shock ([place]) [R57.9] ?Sepsis ([place]) [A41.9] ?Fever, unspecified fever cause [R50.9] ?Altered mental status, unspecified altered mental status type [R41.82] ?Diarrhea, unspecified type [R19.7] ?Patient Active Problem List  ? Diagnosis Date Noted  ? Pneumonia 10/12/2021  ? Metabolic acidosis, increased anion gap (IAG) 10/12/2021  ? Abnormal LFTs 10/12/2021  ? Systolic heart failure ([place]) 10/12/2021  ? A-fib ([place]) 10/12/2021  ? Acute combined systolic and diastolic heart failure ([place])   ? Altered mental status   ? Sepsis ([place]) 10/09/2021  ? Acute metabolic encephalopathy 43/32/9518  ? Hypomagnesemia 10/09/2021  ? Prolonged QT interval 10/09/2021  ? Reflux esophagitis 09/28/2021  ? Dermatitis 09/15/2021  ? Acute renal failure superimposed on stage 3b chronic kidney disease ([place]) 07/25/2021  ? Allergic rhinitis due to pollen 06/12/2021  ? GERD (gastroesophageal reflux disease) 05/04/2021  ? Neoplasm of brain causing mass effect on adjacent structures ([place]) 04/07/2021  ? 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  ? Delirium 02/08/2021  ? HOH (hard of  hearing) 12/28/2020  ? Acute on chronic cholecystitis 12/28/2020  ? Arrhythmia 12/06/2020  ? Pulmonary nodule 12/06/2020  ? Preventative health care 10/12/2020  ? Macular degeneration, wet ([place]) 10/12/2020  ? BPH (benign prostatic hyperplasia) 01/13/2020  ? Neuropathy 02/18/2018  ? COPD (chronic obstructive pulmonary disease) ([place])   ? Chronic renal disease, stage III ([place])   ? Benign essential HTN 04/09/2017  ? CAD (coronary artery disease) 03/03/2015  ? Hyperlipidemia 03/03/2015  ? ?PCP:  Venia Carbon, MD ?Pharmacy:   ?[facility] - [place], [place] - [address], [address] ?[address] ?[place] [address] ?Phone: [phone] Fax: [phone] ? ?[place], [place] ?[place] ?[place] [address] ?Phone: [phone] Fax: [phone] ? ? ? ? ?Social Determinants of Health (SDOH) Interventions ?  ? ?Readmission Risk Interventions ?No flowsheet data found. ? ? ?

## 2021-10-13 NOTE — Consult Note (Signed)
[facility]                                                                                        Patient Identification: Patient Name: Leon Estes MRN: [id number] [place] Date: 10/09/2021  6:58 PM Today's Date: 10/13/2021 Reason for consult: Recurreny C diff  Requesting provider: Darliss Cheney   Principal Problem:   Recurrent Clostridioides difficile diarrhea Active Problems:   CAD (coronary artery disease)   Hyperlipidemia   COPD (chronic obstructive pulmonary disease) (HCC)   BPH (benign prostatic hyperplasia)   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)   GERD (gastroesophageal reflux disease)   Acute renal failure superimposed on stage 3b chronic kidney disease ([place])   Acute metabolic encephalopathy   Hypomagnesemia   Prolonged QT interval   Pneumonia   Metabolic acidosis, increased anion gap (IAG)   Abnormal LFTs   Systolic heart failure (HCC)   A-fib (HCC)   Acute combined systolic and diastolic heart failure (HCC)   Antibiotics: Vancomycin 2/27-c                   Metronidazole 2/27-c                    Ceftriaxone 2/27-c   Lines/Hardware:  Assessment # Recurrent C diff colitis with septic shock presentation  - 12/28/20 C diff antigen and toxin positive. Admitted for severe C diff colitis . \Treated with IV vancomcyin and IV metronidazole with a prolonged PO Vancomycin taper  - 04/21/21 C diff antigen positive and toxin negative. Admitted for C diff colitis and treated with 10 days course of Fidaxomicin  -07/24/21 C diff ag and toxin negative - patient had just completed 18 days course of fidaxomicin by PCP - 10/09/21 C diff ab and toxin positive - C diff colitis with septic shock, on PO vancomycin and IV metronidazole   # Possible spiration PNA  # Septic/Toxic encephalopathy- seems to be improving, 2/2 above   # Newly diagnosed biventricular CHF   # PAF # CAD - Cardiology following, plan for medical management due to multiple comorbidties and dementia  Recommendations  - Continue vancomycin 500mg  PO qid and IV metronidazole given shock presentation although  shock may be related to his newly diagnosed CHF as well  - Serial abdominal exams and monitor stool output from flexiseal. Abdomen is soft, non tender with active bowel sounds  - Not a good candidate for Bezlotuxumab with  newly diagnosed CHF - Will consider prolonged Vancomycin taper on discharge  - Fecal transplantation may be beneficial to prevent recurrence - He has completed 5 days of ceftriaxone and metronidazole today. He is comfortable in room air. Stop ceftriaxone and metronidazole from 3/3 - Following   Dr Tommy Medal on call this weekend and will follow up.   Rest of the management as per the primary team. Please call with questions or concerns.  Thank you for the consult  Rosiland Oz, MD Infectious Disease Physician [facility] [address] [place], [place] [address] Phone: [phone]  Fax: [phone]  __________________________________________________________________________________________________________ HPI and Hospital Course: 88 Y O male with PMH of recurrent C diff colitis, Asthma,, CKD, COPD, CAD s/p stents, PAF, HLD, HTN, BPH s/p TURP, recent diagnosis of schwannoma who presented to the ED on 2/28 with fevers, AMS and diarrhea. At ED, febrile and tachycardic with sepsis eventually requiring vasopressors for septic shock in the setting of aspiration pna/recurrent c diff colitis and admitted to MICU. Also found to have new acute biventricular CHF.  He is on ceftriaxone and metronidazole for presumed aspiration PNA and on po vancomycin and IV metronidazole.   Ct chest 2/28 Bilateral lower lobe bronchiectasis with patchy consolidation in the right lower lobe, concerning for pneumonia Ct abd/pelvis  2/28 . Acute Pancolitis, most likely infectious. Questionable small bowel enteritis also. No bowel obstruction, free air, or free fluid.  ROS: limited, not a good historian. Denies fevers, chills. Denies nausea, vomiting and abdominal pain,. He is also hard of hearing.  He has a flexiseal for diarrhea   Past Medical History:  Diagnosis Date   Asthma    BPH (benign prostatic hypertrophy)    Cataract    Chronic renal disease, stage III (HCC)    Chronic sinusitis    COPD (chronic obstructive pulmonary disease) (HCC)    Coronary artery disease    2 stents   Gall stones    Hearing loss    DOES NOT WEAR HEARING AIDS   High cholesterol    Hypertension    Lactic acid acidosis 10/12/2021   Macular degeneration    right eye   Septic shock ([place]) 10/10/2021   Past Surgical History:  Procedure Laterality Date   APPENDECTOMY     CORONARY ANGIOPLASTY WITH STENT PLACEMENT  1998   2 stents   TRANSURETHRAL RESECTION OF PROSTATE      Scheduled Meds:  aspirin EC  81 mg Oral Daily   Chlorhexidine Gluconate Cloth  6 each Topical Q0600   fluticasone furoate-vilanterol  1 puff Inhalation Daily   [START ON 10/14/2021] furosemide  40 mg Oral Daily   heparin  5,000 Units Subcutaneous Q8H   mouth rinse  15 mL Mouth Rinse BID   mupirocin ointment  1 application Nasal BID   QUEtiapine  25 mg Oral QHS   tamsulosin  0.4 mg Oral Daily   vancomycin  500 mg Oral Q6H   Continuous Infusions:  cefTRIAXone (ROCEPHIN)  IV 2 g (10/12/21 1414)   metronidazole 500 mg (10/13/21 1239)   PRN Meds:.acetaminophen **OR** acetaminophen  Allergies  Allergen Reactions   Benzalkonium Chloride     "Benzalkonium chloride is a quaternary ammonium antiseptic and disinfectant with actions and uses similar to those of other cationic surfactants. It is also used as an antimicrobial preservative for pharmaceutical products."   Augmentin [Amoxicillin-Pot Clavulanate] Rash   Bacitracin-Polymyxin B Swelling and Rash   Neosporin  [Neomycin-Bacitracin Zn-Polymyx] Rash   Sulfamethoxazole-Trimethoprim Rash   Terbinafine And Related Rash   Social History   Socioeconomic History   Marital status: Married    Spouse name: Not on file   Number of children: 4   Years of education: Not on file   Highest education level: Not on file  Occupational History   Occupation: Drove truck and concrete work  Tobacco Use   Smoking status: Former    Types: Cigarettes    Quit date: 08/13/2005    Years since quitting: 16.1    Passive exposure: Past   Smokeless tobacco: Current    Types: Loss adjuster, chartered  Tobacco comments:    discussed stopping this (only once in a while)  Vaping Use   Vaping Use: Never used  Substance and Sexual Activity   Alcohol use: No   Drug use: No   Sexual activity: Not on file  Other Topics Concern   Not on file  Social History Narrative   No living will   Wife, then [place] daughter should make decisions   Would accept resuscitation   Would probably accept tube feeds   Social Determinants of Health   Financial Resource Strain: Not on file  Food Insecurity: Not on file  Transportation Needs: Not on file  Physical Activity: Not on file  Stress: Not on file  Social Connections: Not on file  Intimate Partner Violence: Not on file   Breast Cancer-relatedfamily history is not on file.    Vitals BP 117/62 (BP Location: Right Arm)    Pulse 88    Temp 98.3 F (36.8 C) (Oral)    Resp 18    Wt 68 kg    SpO2 99%    BMI 23.48 kg/m    Physical Exam Constitutional:  lying in bed, appears comfortable and hard of hearing     Comments:   Cardiovascular:     Rate and Rhythm: Normal rate and regular rhythm.     Heart sounds:   Pulmonary:     Effort: Pulmonary effort is normal on room air     Comments: basal crepts+  Abdominal:     Palpations: Abdomen is soft.     Tenderness: non tender and non distended, hyperactive bowel sounds   Musculoskeletal:        General: No swelling or tenderness.   Skin:     Comments:   Neurological:     General: awake, oriented to self and place, follows simple commands.   Psychiatric:        Mood and Affect: Mood normal.    Pertinent Microbiology Results for orders placed or performed during the hospital encounter of 10/09/21  C Difficile Quick Screen w PCR reflex     Status: Abnormal   Collection Time: 10/09/21  7:09 PM   Specimen: Stool  Result Value Ref Range Status   C Diff antigen POSITIVE (A) NEGATIVE Final   C Diff toxin POSITIVE (A) NEGATIVE Final   C Diff interpretation Toxin producing C. difficile detected.  Final    Comment: RESULT CALLED TO, READ BACK BY AND VERIFIED WITH: RN PAYTON BLANCHARD 10/09/21@23 :20 BY TW Performed at [facility], [place] [address] [place], [place] [address]   Blood culture (routine x 2)     Status: None (Preliminary result)   Collection Time: 10/09/21  7:31 PM   Specimen: BLOOD  Result Value Ref Range Status   Specimen Description BLOOD BLOOD RIGHT WRIST  Final   Special Requests   Final    BOTTLES DRAWN AEROBIC AND ANAEROBIC Blood Culture adequate volume   Culture   Final    NO GROWTH 4 DAYS Performed at [facility], [place] [address] [place], [place] [address]    Report Status PENDING  Incomplete  MRSA Next Gen by PCR, Nasal     Status: Abnormal   Collection Time: 10/10/21  5:17 AM   Specimen: Nasal Mucosa; Nasal Swab  Result Value Ref Range Status   MRSA by PCR Next Gen DETECTED (A) NOT DETECTED Final    Comment: RESULT CALLED TO, READ BACK BY AND VERIFIED WITH: F. OWNLEY AT [phone]  10/10/21 D. VANHOOK (NOTE) The GeneXpert MRSA Assay (FDA approved for NASAL specimens only), is one component of a comprehensive MRSA colonization surveillance program. It is not intended to diagnose MRSA infection nor to guide or monitor treatment for MRSA infections. Test performance is not FDA approved in patients less than 60 years old. Performed at [facility], [place] [address] [place],  [place] [address]       Pertinent Lab seen by me: CBC Latest Ref Rng & Units 10/13/2021 10/12/2021 10/11/2021  WBC 4.0 - 10.5 K/uL 11.8(H) 15.9(H) -  Hemoglobin 13.0 - 17.0 g/dL 9.3(L) 7.9(L) 8.5(L)  Hematocrit 39.0 - 52.0 % 28.5(L) 24.2(L) 25.0(L)  Platelets 150 - 400 K/uL 169 170 -   CMP Latest Ref Rng & Units 10/13/2021 10/12/2021 10/11/2021  Glucose 70 - 99 mg/dL 101(H) 92 -  BUN 8 - 23 mg/dL 32(H) 34(H) -  Creatinine 0.61 - 1.24 mg/dL 1.43(H) 1.56(H) -  Sodium 135 - 145 mmol/L 136 137 135  Potassium 3.5 - 5.1 mmol/L 3.7 3.4(L) 4.7  Chloride 98 - 111 mmol/L 111 105 -  CO2 22 - 32 mmol/L 15(L) 18(L) -  Calcium 8.9 - 10.3 mg/dL 8.2(L) 8.0(L) -  Total Protein 6.5 - 8.1 g/dL 5.2(L) 5.2(L) -  Total Bilirubin 0.3 - 1.2 mg/dL QUANTITY NOT SUFFICIENT, UNABLE TO PERFORM TEST 0.6 -  Alkaline Phos 38 - 126 U/L 82 70 -  AST 15 - 41 U/L 62(H) 94(H) -  ALT 0 - 44 U/L 111(H) 119(H) -     Pertinent Imagings/Other Imagings Plain films and CT images have been personally visualized and interpreted; radiology reports have been reviewed. Decision making incorporated into the Impression / Recommendations. CT HEAD WO CONTRAST (5MM)  Result Date: 10/09/2021 CLINICAL DATA:  Mental status change, known schwannoma. EXAM: CT HEAD WITHOUT CONTRAST TECHNIQUE: Contiguous axial images were obtained from the base of the skull through the vertex without intravenous contrast. RADIATION DOSE REDUCTION: This exam was performed according to the departmental dose-optimization program which includes automated exposure control, adjustment of the mA and/or kV according to patient size and/or use of iterative reconstruction technique. COMPARISON:  None. FINDINGS: Brain: No acute intracranial hemorrhage or midline shift. There is redemonstration of a heterogeneous left CP angle mass with mild mass effect on the left cerebral hemisphere. No extra-axial fluid collection. Mild atrophy is noted. Subcortical and periventricular white matter  hypodensities are present bilaterally. There is no hydrocephalus. Vascular: Atherosclerotic calcification of the left vertebral artery and carotid siphons. No hyperdense vessel. Skull: Normal. Negative for fracture or focal lesion. Sinuses/Orbits: Mucosal thickening is present in the right maxillary sinus, left sphenoid sinus, and ethmoid air cells bilaterally. There is evidence of prior ocular surgery bilaterally. Other: None. IMPRESSION: 1. No acute intracranial process. 2. Atrophy with chronic microvascular ischemic changes. 3. Stable left CP angle mass. Electronically Signed   By: Brett Fairy M.D.   On: 10/09/2021 21:38   CT Chest Wo Contrast  Result Date: 10/10/2021 CLINICAL DATA:  Respiratory illness, nondiagnostic x-ray. EXAM: CT CHEST WITHOUT CONTRAST TECHNIQUE: Multidetector CT imaging of the chest was performed following the standard protocol without IV contrast. RADIATION DOSE REDUCTION: This exam was performed according to the departmental dose-optimization program which includes automated exposure control, adjustment of the mA and/or kV according to patient size and/or use of iterative reconstruction technique. COMPARISON:  10/09/2021. FINDINGS: Cardiovascular: The heart is enlarged and there is a trace pericardial effusion. Multi-vessel coronary artery calcifications are noted. There is atherosclerotic calcification  of the aorta without evidence of aneurysm. The pulmonary trunk is normal in caliber. Mediastinum/Nodes: A few prominent lymph nodes are noted in the paratracheal space on the right measuring up to 1 cm in short axis diameter. Evaluation of the hila is limited due to lack of IV contrast. No axillary lymphadenopathy. The thyroid gland and trachea are within normal limits. A hyperdense region is present in the posterior mid esophagus, possibly representing ingested debris. There are calcifications in the mid esophagus, unchanged from the previous exam. Lungs/Pleura: Apical pleural  thickening is noted bilaterally. Emphysematous changes are noted in the lungs. Mild atelectasis or infiltrate is noted at the left lung base. Bilateral lower lobe bronchiectasis is noted with mild consolidation in the right lower lobe. No effusion or pneumothorax. Upper Abdomen: No acute abnormality. Musculoskeletal: No acute osseous abnormality. IMPRESSION: 1. Bilateral lower lobe bronchiectasis with patchy consolidation in the right lower lobe, concerning for pneumonia. 2. Mild atelectasis or infiltrate at the left lung base. 3. Emphysematous changes in lungs with apical pleural thickening bilaterally. 4. Aortic atherosclerosis with multi-vessel and coronary artery calcifications. Electronically Signed   By: Brett Fairy M.D.   On: 10/10/2021 05:02   CT ABDOMEN PELVIS W CONTRAST  Result Date: 10/10/2021 CLINICAL DATA:  88 year old male with acute abdominal pain. EXAM: CT ABDOMEN AND PELVIS WITH CONTRAST TECHNIQUE: Multidetector CT imaging of the abdomen and pelvis was performed using the standard protocol following bolus administration of intravenous contrast. RADIATION DOSE REDUCTION: This exam was performed according to the departmental dose-optimization program which includes automated exposure control, adjustment of the mA and/or kV according to patient size and/or use of iterative reconstruction technique. CONTRAST:  19mL OMNIPAQUE IOHEXOL 350 MG/ML SOLN COMPARISON:  Noncontrast chest CT today reported separately. Noncontrast CT Abdomen and Pelvis 07/25/2021 and earlier. FINDINGS: Lower chest: Chest CT reported separately today. Hepatobiliary: Liver and gallbladder appear stable since 2014. Pancreas: Partially atrophied, otherwise negative. Spleen: Negative. Adrenals/Urinary Tract: Negative adrenal glands. Nonobstructed kidneys with nonspecific bilateral perinephric stranding not significantly changed from last year. Chronic left upper pole renal cyst was present in 2014. Symmetric renal enhancement, but  no contrast excretion on the delayed images. No hydroureter. Diminutive bladder. Stomach/Bowel: Rectal tube in place. Generalized abnormal colonic wall thickening throughout the abdomen and pelvis. Underlying air and fluid containing colon to the rectum. Large bowel mucosal hyperenhancement most pronounced from the splenic flexure distally, but all colonic segments appear inflamed. Appendix not identified, may be diminutive or absent. Nondilated small bowel. Questionable small bowel mucosal hyperenhancement, including in the terminal ileum which is decompressed. No free air or free fluid. Stomach appears unremarkable. Vascular/Lymphatic: Extensive Aortoiliac calcified atherosclerosis. Major arterial structures in the abdomen and pelvis remain patent. Portal venous system is patent. No lymphadenopathy identified. Reproductive: Prostatomegaly redemonstrated. Other: No pelvic free fluid. Musculoskeletal: Chronic L5 pars fractures and grade 2 L5-S1 spondylolisthesis is stable. Chronic lumbar endplate Schmorl's nodes elsewhere. No acute osseous abnormality identified. IMPRESSION: 1. Acute Pancolitis, most likely infectious. Questionable small bowel enteritis also. No bowel obstruction, free air, or free fluid. 2. No obstructive uropathy, but absent renal contrast excretion on the delayed images raises the possibility of acute intrinsic renal disease such as ATN. 3. Chest CT reported separately. 4. Aortic Atherosclerosis (ICD10-I70.0). Electronically Signed   By: Genevie Ann M.D.   On: 10/10/2021 04:59   DG CHEST PORT 1 VIEW  Result Date: 10/10/2021 CLINICAL DATA:  PICC line placement EXAM: PORTABLE CHEST 1 VIEW COMPARISON:  Previous studies including the examination of  10/09/2018 FINDINGS: Transverse diameter of heart is increased. There are no signs of pulmonary edema or focal pulmonary consolidation. There is interval placement of central venous catheter through the left upper extremity with its tip in the superior  vena cava. Left lateral CP angle is not included in its entirety. There is no pneumothorax. IMPRESSION: Tip of central venous catheter is seen in the superior vena cava. Cardiomegaly. There are no new infiltrates or signs of pulmonary edema. There is no pneumothorax. Electronically Signed   By: Elmer Picker M.D.   On: 10/10/2021 16:16   DG Chest Port 1 View  Result Date: 10/09/2021 CLINICAL DATA:  ams EXAM: PORTABLE CHEST 1 VIEW.  Patient is slightly rotated. COMPARISON:  Chest x-ray 07/24/2021 FINDINGS: The heart and mediastinal contours are unchanged. Aortic calcification. Slight haziness of the right lower lobe likely due to patient rotation. Left base atelectasis. No focal consolidation. No pulmonary edema. No pleural effusion. No pneumothorax. No acute osseous abnormality. IMPRESSION: 1. No definite acute cardiopulmonary abnormality. 2.  Aortic Atherosclerosis (ICD10-I70.0). Electronically Signed   By: Iven Finn M.D.   On: 10/09/2021 22:27   ECHOCARDIOGRAM COMPLETE  Result Date: 10/10/2021    ECHOCARDIOGRAM REPORT   Patient Name:   Leon Estes Date of Exam: 10/10/2021 Medical Rec #:  [id number]        Height:       67.0 in Accession #:    [id number]       Weight:       148.4 lb Date of Birth:  Sep 08, 1930         BSA:          1.781 m Patient Age:    88 years         BP:           88/40 mmHg Patient Gender: M                HR:           76 bpm. Exam Location:  Inpatient Procedure: 2D Echo, Cardiac Doppler and Color Doppler Indications:    SHOCK  History:        Patient has prior history of Echocardiogram examinations, most                 recent 12/30/2020. CAD, COPD; Risk Factors:Hypertension and                 Dyslipidemia.  Sonographer:    Beryle Beams Referring Phys: [id number] ELISE L STEPHENSON IMPRESSIONS  1. Left ventricular ejection fraction, by estimation, is 30%. The left ventricle has moderately decreased function. The left ventricle demonstrates global hypokinesis. There is  moderate left ventricular hypertrophy. Left ventricular diastolic parameters  are indeterminate.  2. Right ventricular systolic function is moderately reduced. The right ventricular size is moderately enlarged. There is normal pulmonary artery systolic pressure.  3. Left atrial size was moderately dilated.  4. Right atrial size was severely dilated.  5. The mitral valve is normal in structure. No evidence of mitral valve regurgitation. No evidence of mitral stenosis.  6. The aortic valve is tricuspid. Aortic valve regurgitation is not visualized. Aortic valve sclerosis is present, with no evidence of aortic valve stenosis.  7. The inferior vena cava is dilated in size with <50% respiratory variability, suggesting right atrial pressure of 15 mmHg. Comparison(s): There is new biventricular dysfunction from prior study. Working to notify team. FINDINGS  Left Ventricle: Left ventricular ejection fraction, by estimation, is 30%.  The left ventricle has moderately decreased function. The left ventricle demonstrates global hypokinesis. The left ventricular internal cavity size was normal in size. There is moderate left ventricular hypertrophy. Left ventricular diastolic parameters are indeterminate. Right Ventricle: The right ventricular size is moderately enlarged. No increase in right ventricular wall thickness. Right ventricular systolic function is moderately reduced. There is normal pulmonary artery systolic pressure. The tricuspid regurgitant velocity is 2.28 m/s, and with an assumed right atrial pressure of 15 mmHg, the estimated right ventricular systolic pressure is 11.9 mmHg. Left Atrium: Left atrial size was moderately dilated. Right Atrium: Right atrial size was severely dilated. Pericardium: There is no evidence of pericardial effusion. Mitral Valve: The mitral valve is normal in structure. No evidence of mitral valve regurgitation. No evidence of mitral valve stenosis. Tricuspid Valve: The tricuspid valve is  normal in structure. Tricuspid valve regurgitation is mild . No evidence of tricuspid stenosis. Aortic Valve: The aortic valve is tricuspid. Aortic valve regurgitation is not visualized. Aortic valve sclerosis is present, with no evidence of aortic valve stenosis. Aortic valve mean gradient measures 3.0 mmHg. Aortic valve peak gradient measures 6.0  mmHg. Aortic valve area, by VTI measures 1.13 cm. Pulmonic Valve: The pulmonic valve was not well visualized. Pulmonic valve regurgitation is not visualized. No evidence of pulmonic stenosis. Aorta: The aortic root and ascending aorta are structurally normal, with no evidence of dilitation. Venous: The inferior vena cava is dilated in size with less than 50% respiratory variability, suggesting right atrial pressure of 15 mmHg. IAS/Shunts: No atrial level shunt detected by color flow Doppler.  LEFT VENTRICLE PLAX 2D LVIDd:         3.80 cm LVIDs:         2.70 cm LV PW:         1.60 cm LV IVS:        1.30 cm LVOT diam:     1.90 cm LV SV:         18 LV SV Index:   10 LVOT Area:     2.84 cm  LV Volumes (MOD) LV vol d, MOD A2C: 74.4 ml LV vol d, MOD A4C: 61.4 ml LV vol s, MOD A2C: 51.1 ml LV vol s, MOD A4C: 40.0 ml LV SV MOD A2C:     23.3 ml LV SV MOD A4C:     61.4 ml LV SV MOD BP:      22.4 ml RIGHT VENTRICLE            IVC RV Basal diam:  4.10 cm    IVC diam: 2.20 cm RV Mid diam:    3.80 cm RV S prime:     9.46 cm/s TAPSE (M-mode): 1.4 cm LEFT ATRIUM             Index        RIGHT ATRIUM           Index LA diam:        4.50 cm 2.53 cm/m   RA Area:     28.80 cm LA Vol (A2C):   72.9 ml 40.93 ml/m  RA Volume:   103.00 ml 57.83 ml/m LA Vol (A4C):   72.6 ml 40.76 ml/m LA Biplane Vol: 73.2 ml 41.10 ml/m  AORTIC VALVE                    PULMONIC VALVE AV Area (Vmax):    1.09 cm     PV Vmax:  0.41 m/s AV Area (Vmean):   1.06 cm     PV Vmean:      26.200 cm/s AV Area (VTI):     1.13 cm     PV VTI:        0.056 m AV Vmax:           122.00 cm/s  PV Peak grad:  0.7 mmHg  AV Vmean:          81.400 cm/s  PV Mean grad:  0.0 mmHg AV VTI:            0.160 m AV Peak Grad:      6.0 mmHg AV Mean Grad:      3.0 mmHg LVOT Vmax:         46.70 cm/s LVOT Vmean:        30.400 cm/s LVOT VTI:          0.064 m LVOT/AV VTI ratio: 0.40  AORTA Ao Root diam: 3.20 cm Ao Asc diam:  2.90 cm MR Peak grad: 27.7 mmHg   TRICUSPID VALVE MR Mean grad: 21.0 mmHg   TR Peak grad:   20.8 mmHg MR Vmax:      263.00 cm/s TR Mean grad:   14.0 mmHg MR Vmean:     222.0 cm/s  TR Vmax:        228.00 cm/s                           TR Vmean:       180.0 cm/s                            SHUNTS                           Systemic VTI:  0.06 m                           Systemic Diam: 1.90 cm Rudean Haskell MD Electronically signed by Rudean Haskell MD Signature Date/Time: 10/10/2021/1:47:12 PM    Final     I spent more than 80 minutes for this patient encounter including review of prior medical records/discussing diagnostics and treatment plan with the patient/family/coordinate care with primary/other specialits with greater than 50% of time in face to face encounter.   Electronically signed by:   Rosiland Oz, MD Infectious Disease Physician [facility] Pager: [phone]

## 2021-10-13 NOTE — Progress Notes (Signed)
PROGRESS NOTE    Leon Estes  GXQ:[id number] DOB: March 02, 1931 DOA: 10/09/2021 PCP: Venia Carbon, MD   Brief Narrative:  He presented to [facility] 2/27 with complaints of AMS, fever, and diarrhea x  24 hours. Upon arrival to the ED he was found to be febrile and was not entirely oriented. There was concern for sepsis. He was started on antibiotics and given volume. Stool testing was positive for c. Difficile antigen and toxin.  He was admitted to the hospitalist service for treatment of C. Dif colitis. However, prior to being moved from the ED he developed hypotension with MAPs into the 50s refractory to an additional liter bolus. He was started on norepinephrine and he was admitted under ICU.  Significant Hospital Events: Including procedures, antibiotic start and stop dates in addition to other pertinent events   2/27: Admitted to [facility]  2/28: Transferred to ICU for hypotension. Levophed initiated. CT of abdomen and pelvis reveal acute pancolitis most likely infectious. Small bowel enteritis also.  3/1 off norepi. 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 and  right lower lobe pneumonia/aspiration pneumonia: Shock resolved as of 10/11/2021.  Off of Levophed.  He is on oral vancomycin, IV Flagyl as well as IV Rocephin.  Plan to continue Rocephin for 5 days but continue rest of the medications for total 10 days.  Lactic acid is still elevated.  Unfortunately not a candidate for IV fluids due to CHF.  Discussed with wife.  She tells me that this is the fourth time he has had C. difficile.  Per PCCM note, ID is consulted but there is no note from ID.  I will consult ID.  Patient may be a good candidate for stool transplant.  Acute systolic CHF: New diagnosis of acute systolic CHF during this admission with a EF of 30%.  Patient appears euvolemic.  Cardiology is on board and plans to medically manage due to multiple medical problems and dementia.  He is on IV Lasix but due to elevated lactic acid, that has been stopped and he will be transitioned to oral Lasix from tomorrow.  Management per cardiology.  History of COPD: Not in exacerbation.  Continue home medications/bronchodilators.  Acute septic/toxic encephalopathy: Secondary to infection and sepsis.  Resolved.  Now fully alert and oriented.  AKI: Baseline creatinine around 1.1.  Presented with 1.32, peaked at 2.01 on 10/11/2021.  Now improving.  Down to 1.5 today  Metabolic acidosis: Stable CO2.  Will monitor.  Unfortunately due to acute CHF, cannot give IV fluids.  Elevated LFTs: Likely due to hepatic congestion due to CHF.  Improving.  Bilirubin normal.  Anemia of chronic disease: Stable.  History  of BPH: Continue Flomax.  Chronic peripheral neuropathy: Holding Neurontin for now.  Physical deconditioning: PT OT consulted.  DVT prophylaxis: heparin injection 5,000 Units Start: 10/10/21 1400   Code Status: DNR  Family Communication:  None present at bedside.  Plan of care discussed with patient in length and he/she verbalized understanding and agreed with it.  Later discussed with wife over the  phone.  Status is: Inpatient Remains inpatient appropriate because: Still needs inpatient medical management  Estimated body mass index is 23.48 kg/m as calculated from the following:   Height as of 09/28/21: 5\' 7"  (1.702 m).   Weight as of this encounter: 68 kg.    Nutritional Assessment: Body mass index is 23.48 kg/m.Marland Kitchen Seen by dietician.  I agree with the assessment and plan as outlined below: Nutrition Status:        . Skin Assessment: I have examined the patient's skin and I agree with the wound assessment as performed by the wound care RN as outlined below:    Consultants:  Cardiology  Procedures:  None  Antimicrobials:  Anti-infectives (From admission, onward)    Start     Dose/Rate Route Frequency Ordered Stop   10/11/21 1800  vancomycin (VANCOCIN) capsule 500 mg        500 mg Oral Every 6 hours 10/11/21 1434     10/11/21 1400  vancomycin (VANCOCIN) 50 mg/mL oral solution SOLN 500 mg  Status:  Discontinued        500 mg Oral 4 times daily 10/11/21 1027 10/11/21 1211   10/11/21 1400  vancomycin (VANCOCIN) capsule 500 mg  Status:  Discontinued        500 mg Oral 4 times daily 10/11/21 1211 10/11/21 1434   10/11/21 1215  vancomycin (VANCOCIN) capsule 500 mg        500 mg Oral  Once 10/11/21 1117 10/11/21 1152   10/11/21 1000  vancomycin (VANCOCIN) 50 mg/mL oral solution SOLN 500 mg  Status:  Discontinued        500 mg Per Tube 4 times daily 10/11/21 0750 10/11/21 1027   10/10/21 2200  vancomycin (VANCOREADY) IVPB 750 mg/150 mL  Status:  Discontinued        750 mg 150 mL/hr over 60 Minutes Intravenous Every 24 hours 10/09/21 2021 10/10/21 0110   10/10/21 1800  vancomycin (VANCOCIN) 500 mg in sodium chloride irrigation 0.9 % 100 mL ENEMA  Status:  Discontinued        500 mg Rectal Every 6 hours 10/10/21 1406 10/11/21 0750   10/10/21 1300  cefTRIAXone (ROCEPHIN) 2 g in sodium chloride 0.9 % 100 mL IVPB        2 g 200 mL/hr over 30 Minutes Intravenous Every 24 hours  10/10/21 1159 10/15/21 1259   10/10/21 1130  vancomycin (VANCOCIN) 500 mg in sodium chloride irrigation 0.9 % 100 mL ENEMA        500 mg Rectal  Once 10/10/21 1030 10/10/21 1123   10/10/21 1000  ceFEPIme (MAXIPIME) 2 g in sodium chloride 0.9 % 100 mL IVPB  Status:  Discontinued        2 g 200 mL/hr over 30 Minutes Intravenous Every 12 hours 10/09/21 2021 10/10/21 0110   10/10/21 1000  vancomycin (VANCOCIN) capsule 500 mg  Status:  Discontinued        500 mg Oral Every 6 hours 10/10/21 0908 10/10/21 1406   10/10/21 0600  vancomycin (VANCOCIN) 500 mg in sodium chloride irrigation 0.9 % 100 mL ENEMA  Status:  Discontinued        500 mg Rectal Every 6 hours 10/10/21 0329 10/10/21 0908   10/10/21 0400  metroNIDAZOLE (FLAGYL) IVPB 500 mg        500 mg 100 mL/hr over 60 Minutes Intravenous Every 8 hours 10/10/21 0328     10/10/21 0115  fidaxomicin (DIFICID) tablet 200 mg  Status:  Discontinued        200 mg Oral 2 times daily 10/10/21 0110 10/10/21 0327   10/09/21 2000  ceFEPIme (MAXIPIME) 2 g in sodium chloride 0.9 % 100 mL IVPB        2 g 200 mL/hr over 30 Minutes Intravenous  Once 10/09/21 1948 10/09/21 2149   10/09/21 2000  metroNIDAZOLE (FLAGYL) IVPB 500 mg        500 mg 100 mL/hr over 60 Minutes Intravenous  Once 10/09/21 1948 10/09/21 2149   10/09/21 2000  vancomycin (VANCOCIN) IVPB 1000 mg/200 mL premix  Status:  Discontinued        1,000 mg 200 mL/hr over 60 Minutes Intravenous  Once 10/09/21 1948 10/09/21 1952   10/09/21 2000  vancomycin (VANCOREADY) IVPB 1250 mg/250 mL        1,250 mg 166.7 mL/hr over 90 Minutes Intravenous  Once 10/09/21 1952 10/09/21 2336         Subjective: Seen and examined.  He was fully alert and oriented and he had no complaint.  Objective: Vitals:   10/13/21 0239 10/13/21 0632 10/13/21 0740 10/13/21 0934  BP: (!) 109/56 (!) 85/50  117/62  Pulse: 66 71  88  Resp: 17 19  18   Temp: 97.6 F (36.4 C) 97.8 F (36.6 C)  98.3 F (36.8 C)  TempSrc:  Oral Axillary  Oral  SpO2: 99% 98% 97% 99%  Weight: 68 kg       Intake/Output Summary (Last 24 hours) at 10/13/2021 1334 Last data filed at 10/13/2021 1004 Gross per 24 hour  Intake 820 ml  Output 4075 ml  Net -3255 ml   Filed Weights   10/12/21 0415 10/12/21 2232 10/13/21 0239  Weight: 67.9 kg 67.5 kg 68 kg    Examination:  General exam: Appears calm and comfortable but appears physically weak Respiratory system: Clear to auscultation. Respiratory effort normal. Cardiovascular system: S1 & S2 heard, RRR. No JVD, murmurs, rubs, gallops or clicks. No pedal edema. Gastrointestinal system: Abdomen is nondistended, soft and nontender. No organomegaly or masses felt. Normal bowel sounds heard. Central nervous system: Alert and oriented. No focal neurological deficits. Extremities: Symmetric 5 x 5 power. Skin: No rashes, lesions or ulcers Psychiatry: Judgement and insight appear normal. Mood & affect appropriate.    Data Reviewed: I have personally reviewed following labs and imaging studies  CBC: Recent Labs  Lab 10/09/21 1931 10/09/21 1949 10/10/21 0234 10/11/21 0102 10/11/21 0641 10/12/21 0418 10/13/21 1200  WBC 8.9  --  8.5 11.1*  --  15.9* 11.8*  NEUTROABS 6.8  --   --  9.3*  --  13.7* 9.8*  HGB 9.5*   < > 8.7* 8.5* 8.5* 7.9* 9.3*  HCT 30.6*   < > 28.0* 25.7* 25.0* 24.2* 28.5*  MCV 82.7  --  84.8 78.8*  --  78.8* 79.8*  PLT 233  --  197 181  --  170 169   < > = values in this interval not displayed.   Basic Metabolic Panel: Recent Labs  Lab 10/09/21 1931 10/09/21 1949 10/09/21 1950 10/10/21 0234 10/10/21 0444 10/11/21 0102 10/11/21  7829 10/12/21 0418 10/13/21 0506  NA 134* 133*   < > 137  --  134* 135 137 136  K 3.4* 5.6*   < > 3.4*  --  4.8 4.7 3.4* 3.7  CL 105 103  --  108  --  105  --  105 111  CO2 20*  --   --  17*  --  16*  --  18* 15*  GLUCOSE 102* 101*  --  75  --  85  --  92 101*  BUN 24* 36*  --  28*  --  35*  --  34* 32*  CREATININE 1.32* 1.20   --  1.72*  --  2.01*  --  1.56* 1.43*  CALCIUM 8.2*  --   --  7.5*  --  7.8*  --  8.0* 8.2*  MG 1.5*  --   --   --  1.5*  --   --  2.4  --   PHOS  --   --   --   --  3.5  --   --   --   --    < > = values in this interval not displayed.   GFR: Estimated Creatinine Clearance: 33.4 mL/min (A) (by C-G formula based on SCr of 1.43 mg/dL (H)). Liver Function Tests: Recent Labs  Lab 10/09/21 1931 10/10/21 0234 10/11/21 0102 10/12/21 0418 10/13/21 0506  AST 45* 40 77* 94* 62*  ALT 101* 73* 86* 119* 111*  ALKPHOS 70 49 61 70 82  BILITOT 0.6 0.9 0.8 0.6 QUANTITY NOT SUFFICIENT, UNABLE TO PERFORM TEST  PROT 5.3* 4.4* 5.0* 5.2* 5.2*  ALBUMIN 2.7* 2.0* 2.5* 2.4* 2.5*   No results for input(s): LIPASE, AMYLASE in the last 168 hours. Recent Labs  Lab 10/09/21 1931  AMMONIA 45*   Coagulation Profile: Recent Labs  Lab 10/09/21 1931  INR 1.1   Cardiac Enzymes: No results for input(s): CKTOTAL, CKMB, CKMBINDEX, TROPONINI in the last 168 hours. BNP (last 3 results) No results for input(s): PROBNP in the last 8760 hours. HbA1C: No results for input(s): HGBA1C in the last 72 hours. CBG: Recent Labs  Lab 10/12/21 0807 10/12/21 0835 10/12/21 1218 10/12/21 1530 10/12/21 1952  GLUCAP 64* 97 108* 95 164*   Lipid Profile: No results for input(s): CHOL, HDL, LDLCALC, TRIG, CHOLHDL, LDLDIRECT in the last 72 hours. Thyroid Function Tests: No results for input(s): TSH, T4TOTAL, FREET4, T3FREE, THYROIDAB in the last 72 hours. Anemia Panel: No results for input(s): VITAMINB12, FOLATE, FERRITIN, TIBC, IRON, RETICCTPCT in the last 72 hours. Sepsis Labs: Recent Labs  Lab 10/10/21 0234 10/10/21 0429 10/13/21 0910 10/13/21 1200  LATICACIDVEN 3.8* 3.4* 3.8* 3.5*    Recent Results (from the past 240 hour(s))  C Difficile Quick Screen w PCR reflex     Status: Abnormal   Collection Time: 10/09/21  7:09 PM   Specimen: Stool  Result Value Ref Range Status   C Diff antigen POSITIVE (A)  NEGATIVE Final   C Diff toxin POSITIVE (A) NEGATIVE Final   C Diff interpretation Toxin producing C. difficile detected.  Final    Comment: RESULT CALLED TO, READ BACK BY AND VERIFIED WITH: RN PAYTON BLANCHARD 10/09/21@23 :20 BY TW Performed at [facility], [place] [address] [place], [place] [address]   Blood culture (routine x 2)     Status: None (Preliminary result)   Collection Time: 10/09/21  7:31 PM   Specimen: BLOOD  Result Value Ref Range Status  Specimen Description BLOOD BLOOD RIGHT WRIST  Final   Special Requests   Final    BOTTLES DRAWN AEROBIC AND ANAEROBIC Blood Culture adequate volume   Culture   Final    NO GROWTH 4 DAYS Performed at [facility], [address] [place], [place] [address]    Report Status PENDING  Incomplete  MRSA Next Gen by PCR, Nasal     Status: Abnormal   Collection Time: 10/10/21  5:17 AM   Specimen: Nasal Mucosa; Nasal Swab  Result Value Ref Range Status   MRSA by PCR Next Gen DETECTED (A) NOT DETECTED Final    Comment: RESULT CALLED TO, READ BACK BY AND VERIFIED WITH: F. Pricilla Loveless AT 8413 10/10/21 D. VANHOOK (NOTE) The GeneXpert MRSA Assay (FDA approved for NASAL specimens only), is one component of a comprehensive MRSA colonization surveillance program. It is not intended to diagnose MRSA infection nor to guide or monitor treatment for MRSA infections. Test performance is not FDA approved in patients less than 38 years old. Performed at [facility], [place] [address], [place], [place] [address]      Radiology Studies: No results found.  Scheduled Meds:  aspirin EC  81 mg Oral Daily   Chlorhexidine Gluconate Cloth  6 each Topical Q0600   fluticasone furoate-vilanterol  1 puff Inhalation Daily   [START ON 10/14/2021] furosemide  40 mg Oral Daily   heparin  5,000 Units Subcutaneous Q8H   mouth rinse  15 mL Mouth Rinse BID   mupirocin ointment  1 application Nasal BID   QUEtiapine  25 mg Oral QHS   tamsulosin  0.4 mg Oral  Daily   vancomycin  500 mg Oral Q6H   Continuous Infusions:  cefTRIAXone (ROCEPHIN)  IV 2 g (10/12/21 1414)   metronidazole 500 mg (10/13/21 1239)     LOS: 4 days   Time spent: 36 minutes  Darliss Cheney, MD [facility]  10/13/2021, 1:34 PM  Please page via [place] and do not message via secure chat for urgent patient care matters. Secure chat can be used for non urgent patient care matters.  How to contact the [facility] Attending or Consulting provider [place] or covering provider during after hours [place], for this patient?  Check the care team in [facility] and look for a) attending/consulting [facility] provider listed and b) the [facility] team listed. Page or secure chat [phone]. Log into [email or web address] and use [place]'s universal password to access. If you do not have the password, please contact the hospital operator. Locate the [facility] provider you are looking for under [facility] and page to a number that you can be directly reached. If you still have difficulty reaching the provider, please page the [facility] (Director on Call) for the Hospitalists listed on amion for assistance.

## 2021-10-13 NOTE — Progress Notes (Signed)
? ?Progress Note ? ?Patient Name: Leon Estes ?Date of Encounter: 10/13/2021 ? ?[facility] Cardiologist: Mertie Moores, MD  ? ?Subjective  ? ?Net negative 2.6L yesterday, net even on admission.  Creatinine improving (2.0 > 1.56 >1.43).  BP down to 85/50 overnight, improved to 117/62 this morning.  He denies any chest pain or dyspnea ? ?Inpatient Medications  ?  ?Scheduled Meds: ? Chlorhexidine Gluconate Cloth  6 each Topical Q0600  ? fluticasone furoate-vilanterol  1 puff Inhalation Daily  ? furosemide  40 mg Intravenous BID  ? heparin  5,000 Units Subcutaneous Q8H  ? mouth rinse  15 mL Mouth Rinse BID  ? mupirocin ointment  1 application Nasal BID  ? QUEtiapine  25 mg Oral QHS  ? tamsulosin  0.4 mg Oral Daily  ? vancomycin  500 mg Oral Q6H  ? ?Continuous Infusions: ? cefTRIAXone (ROCEPHIN)  IV 2 g (10/12/21 1414)  ? metronidazole 500 mg (10/13/21 0435)  ? ?PRN Meds: ?acetaminophen **OR** acetaminophen  ? ?Vital Signs  ?  ?Vitals:  ? 10/13/21 0239 10/13/21 8185 10/13/21 0740 10/13/21 0934  ?BP: (!) 109/56 (!) 85/50  117/62  ?Pulse: 66 71  88  ?Resp: 17 19  18   ?Temp: 97.6 ?F (36.4 ?C) 97.8 ?F (36.6 ?C)  98.3 ?F (36.8 ?C)  ?TempSrc: Oral Axillary  Oral  ?SpO2: 99% 98% 97% 99%  ?Weight: 68 kg     ? ? ?Intake/Output Summary (Last 24 hours) at 10/13/2021 0945 ?Last data filed at 10/12/2021 2330 ?Gross per 24 hour  ?Intake 580 ml  ?Output 3625 ml  ?Net -3045 ml  ? ?Last 3 Weights 10/13/2021 10/12/2021 10/12/2021  ?Weight (lbs) 149 lb 14.6 oz 148 lb 13 oz 149 lb 11.1 oz  ?Weight (kg) 68 kg 67.5 kg 67.9 kg  ?   ? ?Telemetry  ?  ?Afib, PVCs, rate 60-80s  - Personally Reviewed ? ?ECG  ?  ?No new ECG - Personally Reviewed ? ?Physical Exam  ? ?GEN: No acute distress.   ?Neck: No JVD ?Cardiac: RRR, no murmurs, rubs, or gallops.  ?Respiratory: Clear to auscultation bilaterally. ?GI: Soft, nontender ?MS: No edema; No deformity. ?Neuro:  Nonfocal  ?Psych: Normal affect  ? ?Labs  ?  ?High Sensitivity Troponin:  No results for  input(s): TROPONINIHS in the last 720 hours.   ?Chemistry ?Recent Labs  ?Lab 10/09/21 ?1931 10/09/21 ?1949 10/10/21 ?6314 10/11/21 ?0102 10/11/21 ?9702 10/12/21 ?6378 10/13/21 ?5885  ?NA 134*   < >  --  134* 135 137 136  ?K 3.4*   < >  --  4.8 4.7 3.4* 3.7  ?CL 105   < >  --  105  --  105 111  ?CO2 20*   < >  --  16*  --  18* 15*  ?GLUCOSE 102*   < >  --  85  --  92 101*  ?BUN 24*   < >  --  35*  --  34* 32*  ?CREATININE 1.32*   < >  --  2.01*  --  1.56* 1.43*  ?CALCIUM 8.2*   < >  --  7.8*  --  8.0* 8.2*  ?MG 1.5*  --  1.5*  --   --  2.4  --   ?PROT 5.3*   < >  --  5.0*  --  5.2* 5.2*  ?ALBUMIN 2.7*   < >  --  2.5*  --  2.4* 2.5*  ?AST 45*   < >  --  77*  --  94* 62*  ?ALT 101*   < >  --  86*  --  119* 111*  ?ALKPHOS 70   < >  --  61  --  70 82  ?BILITOT 0.6   < >  --  0.8  --  0.6 QUANTITY NOT SUFFICIENT, UNABLE TO PERFORM TEST  ?GFRNONAA 52*   < >  --  31*  --  42* 47*  ?ANIONGAP 9   < >  --  13  --  14 10  ? < > = values in this interval not displayed.  ?  ?Lipids No results for input(s): CHOL, TRIG, HDL, LABVLDL, LDLCALC, CHOLHDL in the last 168 hours.  ?Hematology ?Recent Labs  ?Lab 10/10/21 ?0234 10/11/21 ?0102 10/11/21 ?1700 10/12/21 ?1749  ?WBC 8.5 11.1*  --  15.9*  ?RBC 3.30* 3.26*  --  3.07*  ?HGB 8.7* 8.5* 8.5* 7.9*  ?HCT 28.0* 25.7* 25.0* 24.2*  ?MCV 84.8 78.8*  --  78.8*  ?MCH 26.4 26.1  --  25.7*  ?MCHC 31.1 33.1  --  32.6  ?RDW 17.2* 17.2*  --  17.1*  ?PLT 197 181  --  170  ? ?Thyroid No results for input(s): TSH, FREET4 in the last 168 hours.  ?BNPNo results for input(s): BNP, PROBNP in the last 168 hours.  ?DDimer No results for input(s): DDIMER in the last 168 hours.  ? ?Radiology  ?  ?No results found. ? ?Cardiac Studies  ? ?Echo 10/10/21: ? 1. Left ventricular ejection fraction, by estimation, is 30%. The left  ?ventricle has moderately decreased function. The left ventricle  ?demonstrates global hypokinesis. There is moderate left ventricular  ?hypertrophy. Left ventricular diastolic parameters   ? are indeterminate.  ? 2. Right ventricular systolic function is moderately reduced. The right  ?ventricular size is moderately enlarged. There is normal pulmonary artery  ?systolic pressure.  ? 3. Left atrial size was moderately dilated.  ? 4. Right atrial size was severely dilated.  ? 5. The mitral valve is normal in structure. No evidence of mitral valve  ?regurgitation. No evidence of mitral stenosis.  ? 6. The aortic valve is tricuspid. Aortic valve regurgitation is not  ?visualized. Aortic valve sclerosis is present, with no evidence of aortic  ?valve stenosis.  ? 7. The inferior vena cava is dilated in size with <50% respiratory  ?variability, suggesting right atrial pressure of 15 mmHg.  ? ?Patient Profile  ?   ?88 y.o. male with a history of CAD s/p stenting to RCA in 1997, paroxysmal atrial fibrillation not on anticoagulation, hypertension, hyperlipidemia, CKD stage III, chronic anemia, COPD, BPH, and recurrent C. Diff colitis who is being seen for the evaluation of CHF ? ?Assessment & Plan  ?  ?New Onset Systolic CHF: Patient presented with fever, diarrhea, and altered mental status and was admitted for sepsis secondary to recurrent C. Diff colitis. Echo this admission showed LVEF of 30% with global hypokinesis and moderate LVH.  RV moderately enlarged with moderately reduced systolic function but PASP normal.  ?-Received IV fluid resuscitation on admission and subsequently became hypervolemic.  Diuresing with IV Lasix 40 mg twice daily.  He appears euvoelmic this morning.  He received IV lasix 40 mg this morning, will convert to PO lasix 40 mg daily tomorrow ?- Will hold off on beta-blocker for now given soft BP overnight ?- No ACE/ARB/ARNI or MRA due to renal function. May be able to add ARB if renal function returns to baseline  and BP allows. ?- Continue to monitor daily weights, strict I/Os, and renal function. ?- Maintain K greater than 4, mag greater than 2 ?- Drop in EF may be secondary to  sepsis. However, cannot rule out progressive CAD. Given advanced age, possible underlying dementia, and other comorbidities, he is not a good cardiac cath candidate. Would continue to treat medically.  We will plan for repeat echo as outpatient  to reassess EF function after starting GDMT. ?  ?Paroxysmal Atrial Fibrillation: Presented in sinus tachycardia with frequent ectopy but now in rate controlled atrial fibrillation. CHA2DS2-VASc = 5 (CAD, CHF, HTN, age x2). Previously not felt to be a anticoagulation candidate due to high fall risk. Also has chronic anemia with hemoglobin of 7.9 currently ?- Rate controlled with rates in the 60s to 80s. ?- Potassium 3.4 today. Being repleted. ?- Magnesium 2.4 today. ?- Holding off on beta-blocker for now until more compensated from a CHF standpoint. ?  ?Non-Sustained VT ?Patient having frequent PVCs and short runs of NSVT, longest run being 9 beats. ?- No beta-blocker now in setting of decompensated CHF. Will plan to add low dose Coreg prior to discharge. ?  ?CAD ?S/p remote stenting to RCA in 1997. ?- No chest pain.  ?- Previously on aspirin but appears this was stopped.  Will restart ASA 81 mg daily ?- Continue statin. ?  ?Hypertension ?History of hypertension but hypotensive this admission in the setting of sepsis.  Required Levophed but this was able to be stopped on 10/10/2021. ?- BP stable.  ?- Continue to monitor closely. ?  ?Acute on Chronic CKD Stage II ?Creatinine 1.32 on admission and peaked at 2.01 on 10/11/2021.  Baseline around 0.7 to 0.9. ?- Improving at 1.43 today.  ?- Continue to avoid nephrotoxic agents.  ?- Continue to monitor closely. ?  ?Otherwise, per primary team: ?- Sepsis secondary to recurrent C. Diff colitis ?- Acute metabolic encephalopathy ?- Aspiration pneumonia ?- COPD ?- Chronic anemia  ? ?For questions or updates, please contact [place] ?Please consult [email or web address] for contact info under  ? ?  ?   ?Signed, ?Donato Heinz, MD   ?10/13/2021, 9:45 AM   ? ?

## 2021-10-13 NOTE — TOC Benefit Eligibility Note (Signed)
Patient Advocate Encounter ? ?Insurance verification completed.   ? ?The patient is currently admitted and upon discharge could be taking Vancomycin 125 mg capsules. ? ?The current 84 day co-pay is, $200.00.  ? ?The patient is insured through [employer] Medicare Part D  ? ? ? ?Lyndel Safe, CPhT ?Pharmacy Patient Advocate Specialist ?[place] Patient Advocate Team ?Direct Number: [phone]  Fax: [phone] ? ? ? ? ? ?  ?

## 2021-10-14 DIAGNOSIS — A419 Sepsis, unspecified organism: Secondary | ICD-10-CM

## 2021-10-14 DIAGNOSIS — G9341 Metabolic encephalopathy: Secondary | ICD-10-CM

## 2021-10-14 DIAGNOSIS — R7989 Other specified abnormal findings of blood chemistry: Secondary | ICD-10-CM

## 2021-10-14 DIAGNOSIS — I5021 Acute systolic (congestive) heart failure: Secondary | ICD-10-CM

## 2021-10-14 DIAGNOSIS — R6521 Severe sepsis with septic shock: Secondary | ICD-10-CM

## 2021-10-14 DIAGNOSIS — I4729 Other ventricular tachycardia: Secondary | ICD-10-CM

## 2021-10-14 LAB — CBC WITH DIFFERENTIAL/PLATELET
Abs Immature Granulocytes: 0.18 10*3/uL — ABNORMAL HIGH (ref 0.00–0.07)
Basophils Absolute: 0 10*3/uL (ref 0.0–0.1)
Basophils Relative: 0 %
Eosinophils Absolute: 0.2 10*3/uL (ref 0.0–0.5)
Eosinophils Relative: 2 %
HCT: 25.4 % — ABNORMAL LOW (ref 39.0–52.0)
Hemoglobin: 8.2 g/dL — ABNORMAL LOW (ref 13.0–17.0)
Immature Granulocytes: 2 %
Lymphocytes Relative: 16 %
Lymphs Abs: 1.8 10*3/uL (ref 0.7–4.0)
MCH: 25.2 pg — ABNORMAL LOW (ref 26.0–34.0)
MCHC: 32.3 g/dL (ref 30.0–36.0)
MCV: 78.2 fL — ABNORMAL LOW (ref 80.0–100.0)
Monocytes Absolute: 0.8 10*3/uL (ref 0.1–1.0)
Monocytes Relative: 7 %
Neutro Abs: 8.5 10*3/uL — ABNORMAL HIGH (ref 1.7–7.7)
Neutrophils Relative %: 73 %
Platelets: 187 10*3/uL (ref 150–400)
RBC: 3.25 MIL/uL — ABNORMAL LOW (ref 4.22–5.81)
RDW: 17.2 % — ABNORMAL HIGH (ref 11.5–15.5)
WBC: 11.5 10*3/uL — ABNORMAL HIGH (ref 4.0–10.5)
nRBC: 0 % (ref 0.0–0.2)

## 2021-10-14 LAB — CULTURE, BLOOD (ROUTINE X 2)
Culture: NO GROWTH
Special Requests: ADEQUATE

## 2021-10-14 LAB — COMPREHENSIVE METABOLIC PANEL
ALT: 88 U/L — ABNORMAL HIGH (ref 0–44)
AST: 40 U/L (ref 15–41)
Albumin: 2.4 g/dL — ABNORMAL LOW (ref 3.5–5.0)
Alkaline Phosphatase: 79 U/L (ref 38–126)
Anion gap: 8 (ref 5–15)
BUN: 33 mg/dL — ABNORMAL HIGH (ref 8–23)
CO2: 20 mmol/L — ABNORMAL LOW (ref 22–32)
Calcium: 8 mg/dL — ABNORMAL LOW (ref 8.9–10.3)
Chloride: 108 mmol/L (ref 98–111)
Creatinine, Ser: 1.37 mg/dL — ABNORMAL HIGH (ref 0.61–1.24)
GFR, Estimated: 50 mL/min — ABNORMAL LOW (ref >60)
Glucose, Bld: 106 mg/dL — ABNORMAL HIGH (ref 70–99)
Potassium: 4 mmol/L (ref 3.5–5.1)
Sodium: 136 mmol/L (ref 135–145)
Total Bilirubin: 0.3 mg/dL (ref 0.3–1.2)
Total Protein: 5 g/dL — ABNORMAL LOW (ref 6.5–8.1)

## 2021-10-14 LAB — MAGNESIUM: Magnesium: 1.8 mg/dL (ref 1.7–2.4)

## 2021-10-14 LAB — LACTIC ACID, PLASMA
Lactic Acid, Venous: 1.3 mmol/L (ref 0.5–1.9)
Lactic Acid, Venous: 1.2 mmol/L (ref 0.5–1.9)
Lactic Acid, Venous: 1.2 mmol/L (ref 0.5–1.9)

## 2021-10-14 MED ORDER — CARVEDILOL 3.125 MG PO TABS
3.1250 mg | ORAL_TABLET | Freq: Two times a day (BID) | ORAL | Status: DC
  Administered 2021-10-14 – 2021-10-17 (×6): 3.125 mg via ORAL
  Filled 2021-10-14 (×6): qty 1

## 2021-10-14 MED ORDER — ALUM & MAG HYDROXIDE-SIMETH 200-200-20 MG/5ML PO SUSP
15.0000 mL | Freq: Once | ORAL | Status: AC | PRN
  Administered 2021-10-14: 15 mL via ORAL
  Filled 2021-10-14: qty 30

## 2021-10-14 NOTE — Progress Notes (Signed)
PROGRESS NOTE    Leon Estes  TKW:[id number] DOB: 1931-06-23 DOA: 10/09/2021 PCP: Venia Carbon, MD   Brief Narrative:  He presented to [facility] ED 2/27 with complaints of AMS, fever, and diarrhea x  24 hours. Upon arrival to the ED he was found to be febrile and was not entirely oriented. There was concern for sepsis. He was started on antibiotics and given volume. Stool testing was positive for c. Difficile antigen and toxin.  He was admitted to the hospitalist service for treatment of C. Dif colitis. However, prior to being moved from the ED he developed hypotension with MAPs into the 50s refractory to an additional liter bolus. He was started on norepinephrine and he was admitted under ICU.  Significant Hospital Events: Including procedures, antibiotic start and stop dates in addition to other pertinent events   2/27: Admitted to [facility]  2/28: Transferred to ICU for hypotension. Levophed initiated. CT of abdomen and pelvis reveal acute pancolitis most likely infectious. Small bowel enteritis also.  3/1 off norepi. 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 and right lower lobe pneumonia/aspiration pneumonia: Shock resolved as of 10/11/2021.  Off of Levophed.  He completed 5 days of Rocephin and Flagyl.  ID was consulted who discontinued Flagyl but he remains on high-dose of oral vancomycin.  Still has significant amount of fecal output.  He will benefit from stool transplant down the road once he is completely treated.  Lactic acidosis has resolved as well.  Acute systolic CHF: New diagnosis of acute systolic CHF during this admission with a EF of 30%.  Patient appears euvolemic.  Cardiology is on board and plans to medically manage due to multiple medical problems and dementia.  He is now on oral Lasix.  History of COPD: Not in exacerbation.  Continue home medications/bronchodilators.  Acute septic/toxic encephalopathy: Secondary to infection and sepsis.  Resolved.  Now fully alert and oriented.  AKI: Baseline creatinine around 1.1.  Presented with 1.32, peaked at 2.01 on 10/11/2021.  Now improving.  Down to 1. 37 today  Metabolic acidosis: CO2 improving.  Will monitor.    Elevated LFTs: Likely due to hepatic congestion due to CHF.  Improving.  Bilirubin normal.  Anemia of chronic disease: Stable.  History of BPH: Continue Flomax.  Chronic peripheral neuropathy: Holding Neurontin for now.  Physical deconditioning: PT OT consulted.  DVT prophylaxis: heparin injection 5,000 Units Start: 10/10/21 1400   Code Status: DNR  Family Communication:  None present at bedside.  Tried calling wife but left voicemail.  Status is: Inpatient Remains inpatient appropriate because: Still needs inpatient medical management  Estimated  body mass index is 23.48 kg/m as calculated from the following:   Height as of 09/28/21: '5\' 7"'$  (1.702 m).   Weight as of this encounter: 68 kg.    Nutritional Assessment: Body mass index is 23.48 kg/m.Marland Kitchen Seen by dietician.  I agree with the assessment  and plan as outlined below: Nutrition Status:        . Skin Assessment: I have examined the patient's skin and I agree with the wound assessment as performed by the wound care RN as outlined below:    Consultants:  Cardiology  Procedures:  None  Antimicrobials:  Anti-infectives (From admission, onward)    Start     Dose/Rate Route Frequency Ordered Stop   10/11/21 1800  vancomycin (VANCOCIN) capsule 500 mg        500 mg Oral Every 6 hours 10/11/21 1434     10/11/21 1400  vancomycin (VANCOCIN) 50 mg/mL oral solution SOLN 500 mg  Status:  Discontinued        500 mg Oral 4 times daily 10/11/21 1027 10/11/21 1211   10/11/21 1400  vancomycin (VANCOCIN) capsule 500 mg  Status:  Discontinued        500 mg Oral 4 times daily 10/11/21 1211 10/11/21 1434   10/11/21 1215  vancomycin (VANCOCIN) capsule 500 mg        500 mg Oral  Once 10/11/21 1117 10/11/21 1152   10/11/21 1000  vancomycin (VANCOCIN) 50 mg/mL oral solution SOLN 500 mg  Status:  Discontinued        500 mg Per Tube 4 times daily 10/11/21 0750 10/11/21 1027   10/10/21 2200  vancomycin (VANCOREADY) IVPB 750 mg/150 mL  Status:  Discontinued        750 mg 150 mL/hr over 60 Minutes Intravenous Every 24 hours 10/09/21 2021 10/10/21 0110   10/10/21 1800  vancomycin (VANCOCIN) 500 mg in sodium chloride irrigation 0.9 % 100 mL ENEMA  Status:  Discontinued        500 mg Rectal Every 6 hours 10/10/21 1406 10/11/21 0750   10/10/21 1300  cefTRIAXone (ROCEPHIN) 2 g in sodium chloride 0.9 % 100 mL IVPB  Status:  Discontinued        2 g 200 mL/hr over 30 Minutes Intravenous Every 24 hours 10/10/21 1159 10/14/21 1012   10/10/21 1130  vancomycin (VANCOCIN) 500 mg in sodium chloride irrigation 0.9 % 100 mL ENEMA        500 mg Rectal  Once 10/10/21 1030 10/10/21 1123   10/10/21 1000  ceFEPIme (MAXIPIME) 2 g in sodium chloride 0.9 % 100 mL IVPB  Status:  Discontinued        2 g 200 mL/hr over 30 Minutes Intravenous Every 12 hours 10/09/21  2021 10/10/21 0110   10/10/21 1000  vancomycin (VANCOCIN) capsule 500 mg  Status:  Discontinued        500 mg Oral Every 6 hours 10/10/21 0908 10/10/21 1406   10/10/21 0600  vancomycin (VANCOCIN) 500 mg in sodium chloride irrigation 0.9 % 100 mL ENEMA  Status:  Discontinued        500 mg Rectal Every 6 hours 10/10/21 0329 10/10/21 0908   10/10/21 0400  metroNIDAZOLE (FLAGYL) IVPB 500 mg  Status:  Discontinued        500 mg 100 mL/hr over 60 Minutes Intravenous Every 8 hours 10/10/21 0328 10/14/21 1320   10/10/21 0115  fidaxomicin (DIFICID) tablet 200 mg  Status:  Discontinued  200 mg Oral 2 times daily 10/10/21 0110 10/10/21 0327   10/09/21 2000  ceFEPIme (MAXIPIME) 2 g in sodium chloride 0.9 % 100 mL IVPB        2 g 200 mL/hr over 30 Minutes Intravenous  Once 10/09/21 1948 10/09/21 2149   10/09/21 2000  metroNIDAZOLE (FLAGYL) IVPB 500 mg        500 mg 100 mL/hr over 60 Minutes Intravenous  Once 10/09/21 1948 10/09/21 2149   10/09/21 2000  vancomycin (VANCOCIN) IVPB 1000 mg/200 mL premix  Status:  Discontinued        1,000 mg 200 mL/hr over 60 Minutes Intravenous  Once 10/09/21 1948 10/09/21 1952   10/09/21 2000  vancomycin (VANCOREADY) IVPB 1250 mg/250 mL        1,250 mg 166.7 mL/hr over 90 Minutes Intravenous  Once 10/09/21 1952 10/09/21 2336         Subjective: Seen and examined.  Fully alert and oriented.  He is complaining of pain in bilateral heels and he has pressure ulcer there.  Objective: Vitals:   10/13/21 1814 10/13/21 2101 10/14/21 0437 10/14/21 0904  BP: 115/69 117/75 107/69 116/62  Pulse: 89 85 94 60  Resp: '17 18 18 18  '$ Temp: 98.8 F (37.1 C) 98.1 F (36.7 C) (!) 97.4 F (36.3 C) 98.3 F (36.8 C)  TempSrc: Oral Oral Oral Oral  SpO2: 100% 100% 100% 98%  Weight:        Intake/Output Summary (Last 24 hours) at 10/14/2021 1330 Last data filed at 10/14/2021 0600 Gross per 24 hour  Intake 780 ml  Output 2700 ml  Net -1920 ml    Filed Weights    10/12/21 0415 10/12/21 2232 10/13/21 0239  Weight: 67.9 kg 67.5 kg 68 kg    Examination:  General exam: Appears calm and comfortable  Respiratory system: Clear to auscultation. Respiratory effort normal. Cardiovascular system: S1 & S2 heard, RRR. No JVD, murmurs, rubs, gallops or clicks. No pedal edema. Gastrointestinal system: Abdomen is nondistended, soft and nontender. No organomegaly or masses felt. Normal bowel sounds heard. Central nervous system: Alert and oriented. No focal neurological deficits. Extremities: Symmetric 5 x 5 power. Skin: No rashes, lesions or ulcers.  Psychiatry: Judgement and insight appear normal. Mood & affect appropriate.   Data Reviewed: I have personally reviewed following labs and imaging studies  CBC: Recent Labs  Lab 10/09/21 1931 10/09/21 1949 10/10/21 0234 10/11/21 0102 10/11/21 0641 10/12/21 0418 10/13/21 1200 10/14/21 0126  WBC 8.9  --  8.5 11.1*  --  15.9* 11.8* 11.5*  NEUTROABS 6.8  --   --  9.3*  --  13.7* 9.8* 8.5*  HGB 9.5*   < > 8.7* 8.5* 8.5* 7.9* 9.3* 8.2*  HCT 30.6*   < > 28.0* 25.7* 25.0* 24.2* 28.5* 25.4*  MCV 82.7  --  84.8 78.8*  --  78.8* 79.8* 78.2*  PLT 233  --  197 181  --  170 169 187   < > = values in this interval not displayed.    Basic Metabolic Panel: Recent Labs  Lab 10/09/21 1931 10/09/21 1949 10/10/21 0234 10/10/21 0444 10/11/21 0102 10/11/21 4431 10/12/21 0418 10/13/21 0506 10/14/21 0126  NA 134*   < > 137  --  134* 135 137 136 136  K 3.4*   < > 3.4*  --  4.8 4.7 3.4* 3.7 4.0  CL 105   < > 108  --  105  --  105 111 108  CO2  20*  --  17*  --  16*  --  18* 15* 20*  GLUCOSE 102*   < > 75  --  85  --  92 101* 106*  BUN 24*   < > 28*  --  35*  --  34* 32* 33*  CREATININE 1.32*   < > 1.72*  --  2.01*  --  1.56* 1.43* 1.37*  CALCIUM 8.2*  --  7.5*  --  7.8*  --  8.0* 8.2* 8.0*  MG 1.5*  --   --  1.5*  --   --  2.4  --  1.8  PHOS  --   --   --  3.5  --   --   --   --   --    < > = values in this  interval not displayed.    GFR: Estimated Creatinine Clearance: 34.8 mL/min (A) (by C-G formula based on SCr of 1.37 mg/dL (H)). Liver Function Tests: Recent Labs  Lab 10/10/21 0234 10/11/21 0102 10/12/21 0418 10/13/21 0506 10/14/21 0126  AST 40 77* 94* 62* 40  ALT 73* 86* 119* 111* 88*  ALKPHOS 49 61 70 82 79  BILITOT 0.9 0.8 0.6 QUANTITY NOT SUFFICIENT, UNABLE TO PERFORM TEST 0.3  PROT 4.4* 5.0* 5.2* 5.2* 5.0*  ALBUMIN 2.0* 2.5* 2.4* 2.5* 2.4*    No results for input(s): LIPASE, AMYLASE in the last 168 hours. Recent Labs  Lab 10/09/21 1931  AMMONIA 45*    Coagulation Profile: Recent Labs  Lab 10/09/21 1931  INR 1.1    Cardiac Enzymes: No results for input(s): CKTOTAL, CKMB, CKMBINDEX, TROPONINI in the last 168 hours. BNP (last 3 results) No results for input(s): PROBNP in the last 8760 hours. HbA1C: No results for input(s): HGBA1C in the last 72 hours. CBG: Recent Labs  Lab 10/12/21 0807 10/12/21 0835 10/12/21 1218 10/12/21 1530 10/12/21 1952  GLUCAP 64* 97 108* 95 164*    Lipid Profile: No results for input(s): CHOL, HDL, LDLCALC, TRIG, CHOLHDL, LDLDIRECT in the last 72 hours. Thyroid Function Tests: No results for input(s): TSH, T4TOTAL, FREET4, T3FREE, THYROIDAB in the last 72 hours. Anemia Panel: No results for input(s): VITAMINB12, FOLATE, FERRITIN, TIBC, IRON, RETICCTPCT in the last 72 hours. Sepsis Labs: Recent Labs  Lab 10/13/21 0910 10/13/21 1200 10/14/21 0938 10/14/21 1231  LATICACIDVEN 3.8* 3.5* 1.3 1.2     Recent Results (from the past 240 hour(s))  C Difficile Quick Screen w PCR reflex     Status: Abnormal   Collection Time: 10/09/21  7:09 PM   Specimen: Stool  Result Value Ref Range Status   C Diff antigen POSITIVE (A) NEGATIVE Final   C Diff toxin POSITIVE (A) NEGATIVE Final   C Diff interpretation Toxin producing C. difficile detected.  Final    Comment: RESULT CALLED TO, READ BACK BY AND VERIFIED WITH: RN PAYTON  BLANCHARD 10/09/21'@23'$ :20 BY TW Performed at [facility], [place] [address], [place], [place] [address]   Blood culture (routine x 2)     Status: None   Collection Time: 10/09/21  7:31 PM   Specimen: BLOOD  Result Value Ref Range Status   Specimen Description BLOOD BLOOD RIGHT WRIST  Final   Special Requests   Final    BOTTLES DRAWN AEROBIC AND ANAEROBIC Blood Culture adequate volume   Culture   Final    NO GROWTH 5 DAYS Performed at [facility], [place] [address], [place], [place] [address]  Report Status 10/14/2021 FINAL  Final  MRSA Next Gen by PCR, Nasal     Status: Abnormal   Collection Time: 10/10/21  5:17 AM   Specimen: Nasal Mucosa; Nasal Swab  Result Value Ref Range Status   MRSA by PCR Next Gen DETECTED (A) NOT DETECTED Final    Comment: RESULT CALLED TO, READ BACK BY AND VERIFIED WITH: F. Pricilla Loveless AT 3329 10/10/21 D. VANHOOK (NOTE) The GeneXpert MRSA Assay (FDA approved for NASAL specimens only), is one component of a comprehensive MRSA colonization surveillance program. It is not intended to diagnose MRSA infection nor to guide or monitor treatment for MRSA infections. Test performance is not FDA approved in patients less than 43 years old. Performed at [facility], [place] [address], [place], [place] [address]       Radiology Studies: No results found.  Scheduled Meds:  aspirin EC  81 mg Oral Daily   fluticasone furoate-vilanterol  1 puff Inhalation Daily   furosemide  40 mg Oral Daily   heparin  5,000 Units Subcutaneous Q8H   mouth rinse  15 mL Mouth Rinse BID   mupirocin ointment  1 application Nasal BID   QUEtiapine  25 mg Oral QHS   tamsulosin  0.4 mg Oral Daily   vancomycin  500 mg Oral Q6H   Continuous Infusions:     LOS: 5 days   Time spent: 30 minutes  Darliss Cheney, MD [facility]  10/14/2021, 1:30 PM  Please page via [facility] and do not message via secure chat for urgent patient care matters. Secure chat can be used for non  urgent patient care matters.  How to contact the [facility] Attending or Consulting provider [place] or covering provider during after hours [place], for this patient?  Check the care team in [facility] and look for a) attending/consulting [facility] provider listed and b) the [facility] team listed. Page or secure chat [phone]. Log into [email or web address] and use [place]'s universal password to access. If you do not have the password, please contact the hospital operator. Locate the [facility] provider you are looking for under [facility] and page to a number that you can be directly reached. If you still have difficulty reaching the provider, please page the [facility] (Director on Call) for the Hospitalists listed on amion for assistance.

## 2021-10-14 NOTE — Progress Notes (Signed)
? ?Progress Note ? ?Patient Name: MAHARI STRAHM ?Date of Encounter: 10/14/2021 ? ?[facility] Cardiologist: Mertie Moores, MD  ? ?Subjective  ? ?Net negative 3.6L yesterday, -3.5 L on admission.  Creatinine improving (2.0 > 1.56 >1.43 (1.37).  BP improved today, 116/62. He denies any chest pain or dyspnea ? ?Inpatient Medications  ?  ?Scheduled Meds: ? aspirin EC  81 mg Oral Daily  ? fluticasone furoate-vilanterol  1 puff Inhalation Daily  ? furosemide  40 mg Oral Daily  ? heparin  5,000 Units Subcutaneous Q8H  ? mouth rinse  15 mL Mouth Rinse BID  ? mupirocin ointment  1 application Nasal BID  ? QUEtiapine  25 mg Oral QHS  ? tamsulosin  0.4 mg Oral Daily  ? vancomycin  500 mg Oral Q6H  ? ?Continuous Infusions: ? metronidazole 500 mg (10/14/21 0437)  ? ?PRN Meds: ?acetaminophen **OR** acetaminophen  ? ?Vital Signs  ?  ?Vitals:  ? 10/13/21 1814 10/13/21 2101 10/14/21 0437 10/14/21 0904  ?BP: 115/69 117/75 107/69 116/62  ?Pulse: 89 85 94 60  ?Resp: '17 18 18 18  '$ ?Temp: 98.8 ?F (37.1 ?C) 98.1 ?F (36.7 ?C) (!) 97.4 ?F (36.3 ?C) 98.3 ?F (36.8 ?C)  ?TempSrc: Oral Oral Oral Oral  ?SpO2: 100% 100% 100% 98%  ?Weight:      ? ? ?Intake/Output Summary (Last 24 hours) at 10/14/2021 1014 ?Last data filed at 10/14/2021 0600 ?Gross per 24 hour  ?Intake 780 ml  ?Output 3400 ml  ?Net -2620 ml  ? ? ?Last 3 Weights 10/13/2021 10/12/2021 10/12/2021  ?Weight (lbs) 149 lb 14.6 oz 148 lb 13 oz 149 lb 11.1 oz  ?Weight (kg) 68 kg 67.5 kg 67.9 kg  ?   ? ?Telemetry  ?  ?Afib, PVCs, rate 60-80s  - Personally Reviewed ? ?ECG  ?  ?No new ECG - Personally Reviewed ? ?Physical Exam  ? ?GEN: No acute distress.   ?Neck: No JVD ?Cardiac: RRR, no murmurs, rubs, or gallops.  ?Respiratory: Clear to auscultation bilaterally. ?GI: Soft, nontender ?MS: No edema; No deformity. ?Neuro:  Nonfocal  ?Psych: Normal affect  ? ?Labs  ?  ?High Sensitivity Troponin:  No results for input(s): TROPONINIHS in the last 720 hours.   ?Chemistry ?Recent Labs  ?Lab 10/10/21 ?0444  10/11/21 ?0102 10/12/21 ?0418 10/13/21 ?3785 10/14/21 ?0126  ?NA  --    < > 137 136 136  ?K  --    < > 3.4* 3.7 4.0  ?CL  --    < > 105 111 108  ?CO2  --    < > 18* 15* 20*  ?GLUCOSE  --    < > 92 101* 106*  ?BUN  --    < > 34* 32* 33*  ?CREATININE  --    < > 1.56* 1.43* 1.37*  ?CALCIUM  --    < > 8.0* 8.2* 8.0*  ?MG 1.5*  --  2.4  --  1.8  ?PROT  --    < > 5.2* 5.2* 5.0*  ?ALBUMIN  --    < > 2.4* 2.5* 2.4*  ?AST  --    < > 94* 62* 40  ?ALT  --    < > 119* 111* 88*  ?ALKPHOS  --    < > 70 82 79  ?BILITOT  --    < > 0.6 QUANTITY NOT SUFFICIENT, UNABLE TO PERFORM TEST 0.3  ?GFRNONAA  --    < > 42* 47* 50*  ?ANIONGAP  --    < >  $'14 10 8  't$ ? < > = values in this interval not displayed.  ? ?  ?Lipids No results for input(s): CHOL, TRIG, HDL, LABVLDL, LDLCALC, CHOLHDL in the last 168 hours.  ?Hematology ?Recent Labs  ?Lab 10/12/21 ?0418 10/13/21 ?1200 10/14/21 ?0126  ?WBC 15.9* 11.8* 11.5*  ?RBC 3.07* 3.57* 3.25*  ?HGB 7.9* 9.3* 8.2*  ?HCT 24.2* 28.5* 25.4*  ?MCV 78.8* 79.8* 78.2*  ?MCH 25.7* 26.1 25.2*  ?MCHC 32.6 32.6 32.3  ?RDW 17.1* 17.2* 17.2*  ?PLT 170 169 187  ? ? ?Thyroid No results for input(s): TSH, FREET4 in the last 168 hours.  ?BNPNo results for input(s): BNP, PROBNP in the last 168 hours.  ?DDimer No results for input(s): DDIMER in the last 168 hours.  ? ?Radiology  ?  ?No results found. ? ?Cardiac Studies  ? ?Echo 10/10/21: ? 1. Left ventricular ejection fraction, by estimation, is 30%. The left  ?ventricle has moderately decreased function. The left ventricle  ?demonstrates global hypokinesis. There is moderate left ventricular  ?hypertrophy. Left ventricular diastolic parameters  ? are indeterminate.  ? 2. Right ventricular systolic function is moderately reduced. The right  ?ventricular size is moderately enlarged. There is normal pulmonary artery  ?systolic pressure.  ? 3. Left atrial size was moderately dilated.  ? 4. Right atrial size was severely dilated.  ? 5. The mitral valve is normal in structure.  No evidence of mitral valve  ?regurgitation. No evidence of mitral stenosis.  ? 6. The aortic valve is tricuspid. Aortic valve regurgitation is not  ?visualized. Aortic valve sclerosis is present, with no evidence of aortic  ?valve stenosis.  ? 7. The inferior vena cava is dilated in size with <50% respiratory  ?variability, suggesting right atrial pressure of 15 mmHg.  ? ?Patient Profile  ?   ?88 y.o. male with a history of CAD s/p stenting to RCA in 1997, paroxysmal atrial fibrillation not on anticoagulation, hypertension, hyperlipidemia, CKD stage III, chronic anemia, COPD, BPH, and recurrent C. Diff colitis who is being seen for the evaluation of CHF ? ?Assessment & Plan  ?  ?New Onset Systolic CHF: Patient presented with fever, diarrhea, and altered mental status and was admitted for sepsis secondary to recurrent C. Diff colitis. Echo this admission showed LVEF of 30% with global hypokinesis and moderate LVH.  RV moderately enlarged with moderately reduced systolic function but PASP normal.  ?-Received IV fluid resuscitation on admission and subsequently became hypervolemic.  Diuresing with IV Lasix 40 mg twice daily.  Volume status improved, will convert to PO lasix 40 mg daily  ?- Will hold off on beta-blocker for now given soft BP yesterday ?- No ACE/ARB/ARNI or MRA due to renal function. May be able to add ARB if renal function returns to baseline and BP allows. ?- Continue to monitor daily weights, strict I/Os, and renal function. ?- Maintain K greater than 4, mag greater than 2 ?- Drop in EF may be secondary to sepsis. However, cannot rule out progressive CAD. Given advanced age, possible underlying dementia, and other comorbidities, he is not a good cardiac cath candidate. Would continue to treat medically.  We will plan for repeat echo as outpatient  to reassess EF function after starting GDMT. ?  ?Paroxysmal Atrial Fibrillation: Presented in sinus tachycardia with frequent ectopy but now in rate  controlled atrial fibrillation. CHA2DS2-VASc = 5 (CAD, CHF, HTN, age x2). Previously not felt to be a anticoagulation candidate due to high fall risk. Also has chronic anemia  with hemoglobin of 7.9 currently ?- Rate controlled with rates in the 60s to 80s. ?- Holding off on beta-blocker for now ?  ?Non-Sustained VT ?Patient having frequent PVCs and short runs of NSVT, longest run being 13 beats. ?- No beta-blocker now in setting of decompensated CHF. Will plan to add low dose Coreg prior to discharge if BP stable ?  ?CAD ?S/p remote stenting to RCA in 1997. ?- No chest pain.  ?- Previously on aspirin but appears this was stopped.  Will restart ASA 81 mg daily ?- Continue statin. ?  ?Hypertension ?History of hypertension but hypotensive this admission in the setting of sepsis.  Required Levophed but this was able to be stopped on 10/10/2021. ?- BP stable.  ?- Continue to monitor closely. ?  ?Acute on Chronic CKD Stage II ?Creatinine 1.32 on admission and peaked at 2.01 on 10/11/2021.  Baseline around 0.7 to 0.9. ?- Improving at 1.37 today.  ?- Continue to avoid nephrotoxic agents.  ?- Continue to monitor closely. ?  ?Otherwise, per primary team: ?- Sepsis secondary to recurrent C. Diff colitis ?- Acute metabolic encephalopathy ?- Aspiration pneumonia ?- COPD ?- Chronic anemia  ? ?For questions or updates, please contact [place] ?Please consult [email or web address] for contact info under  ? ?  ?   ?Signed, ?Donato Heinz, MD  ?10/14/2021, 10:14 AM   ? ?

## 2021-10-14 NOTE — Progress Notes (Signed)
Subjective: He is complaining of some numbness in his toes   Antibiotics:  Anti-infectives (From admission, onward)    Start     Dose/Rate Route Frequency Ordered Stop   10/11/21 1800  vancomycin (VANCOCIN) capsule 500 mg        500 mg Oral Every 6 hours 10/11/21 1434     10/11/21 1400  vancomycin (VANCOCIN) 50 mg/mL oral solution SOLN 500 mg  Status:  Discontinued        500 mg Oral 4 times daily 10/11/21 1027 10/11/21 1211   10/11/21 1400  vancomycin (VANCOCIN) capsule 500 mg  Status:  Discontinued        500 mg Oral 4 times daily 10/11/21 1211 10/11/21 1434   10/11/21 1215  vancomycin (VANCOCIN) capsule 500 mg        500 mg Oral  Once 10/11/21 1117 10/11/21 1152   10/11/21 1000  vancomycin (VANCOCIN) 50 mg/mL oral solution SOLN 500 mg  Status:  Discontinued        500 mg Per Tube 4 times daily 10/11/21 0750 10/11/21 1027   10/10/21 2200  vancomycin (VANCOREADY) IVPB 750 mg/150 mL  Status:  Discontinued        750 mg 150 mL/hr over 60 Minutes Intravenous Every 24 hours 10/09/21 2021 10/10/21 0110   10/10/21 1800  vancomycin (VANCOCIN) 500 mg in sodium chloride irrigation 0.9 % 100 mL ENEMA  Status:  Discontinued        500 mg Rectal Every 6 hours 10/10/21 1406 10/11/21 0750   10/10/21 1300  cefTRIAXone (ROCEPHIN) 2 g in sodium chloride 0.9 % 100 mL IVPB  Status:  Discontinued        2 g 200 mL/hr over 30 Minutes Intravenous Every 24 hours 10/10/21 1159 10/14/21 1012   10/10/21 1130  vancomycin (VANCOCIN) 500 mg in sodium chloride irrigation 0.9 % 100 mL ENEMA        500 mg Rectal  Once 10/10/21 1030 10/10/21 1123   10/10/21 1000  ceFEPIme (MAXIPIME) 2 g in sodium chloride 0.9 % 100 mL IVPB  Status:  Discontinued        2 g 200 mL/hr over 30 Minutes Intravenous Every 12 hours 10/09/21 2021 10/10/21 0110   10/10/21 1000  vancomycin (VANCOCIN) capsule 500 mg  Status:  Discontinued        500 mg Oral Every 6 hours 10/10/21 0908 10/10/21 1406   10/10/21 0600  vancomycin  (VANCOCIN) 500 mg in sodium chloride irrigation 0.9 % 100 mL ENEMA  Status:  Discontinued        500 mg Rectal Every 6 hours 10/10/21 0329 10/10/21 0908   10/10/21 0400  metroNIDAZOLE (FLAGYL) IVPB 500 mg        500 mg 100 mL/hr over 60 Minutes Intravenous Every 8 hours 10/10/21 0328 10/14/21 2359   10/10/21 0115  fidaxomicin (DIFICID) tablet 200 mg  Status:  Discontinued        200 mg Oral 2 times daily 10/10/21 0110 10/10/21 0327   10/09/21 2000  ceFEPIme (MAXIPIME) 2 g in sodium chloride 0.9 % 100 mL IVPB        2 g 200 mL/hr over 30 Minutes Intravenous  Once 10/09/21 1948 10/09/21 2149   10/09/21 2000  metroNIDAZOLE (FLAGYL) IVPB 500 mg        500 mg 100 mL/hr over 60 Minutes Intravenous  Once 10/09/21 1948 10/09/21 2149   10/09/21 2000  vancomycin (VANCOCIN) IVPB 1000  mg/200 mL premix  Status:  Discontinued        1,000 mg 200 mL/hr over 60 Minutes Intravenous  Once 10/09/21 1948 10/09/21 1952   10/09/21 2000  vancomycin (VANCOREADY) IVPB 1250 mg/250 mL        1,250 mg 166.7 mL/hr over 90 Minutes Intravenous  Once 10/09/21 1952 10/09/21 2336       Medications: Scheduled Meds:  aspirin EC  81 mg Oral Daily   fluticasone furoate-vilanterol  1 puff Inhalation Daily   furosemide  40 mg Oral Daily   heparin  5,000 Units Subcutaneous Q8H   mouth rinse  15 mL Mouth Rinse BID   mupirocin ointment  1 application Nasal BID   QUEtiapine  25 mg Oral QHS   tamsulosin  0.4 mg Oral Daily   vancomycin  500 mg Oral Q6H   Continuous Infusions:  metronidazole 500 mg (10/14/21 1108)   PRN Meds:.acetaminophen **OR** acetaminophen    Objective: Weight change:   Intake/Output Summary (Last 24 hours) at 10/14/2021 1315 Last data filed at 10/14/2021 0600 Gross per 24 hour  Intake 780 ml  Output 2700 ml  Net -1920 ml   Blood pressure 116/62, pulse 60, temperature 98.3 F (36.8 C), temperature source Oral, resp. rate 18, weight 68 kg, SpO2 98 %. Temp:  [97.4 F (36.3 C)-98.8 F (37.1  C)] 98.3 F (36.8 C) (03/04 0904) Pulse Rate:  [60-94] 60 (03/04 0904) Resp:  [17-18] 18 (03/04 0904) BP: (107-117)/(62-75) 116/62 (03/04 0904) SpO2:  [98 %-100 %] 98 % (03/04 0904)  Physical Exam: Physical Exam HENT:     Head: Normocephalic and atraumatic.  Eyes:     General:        Right eye: No discharge.        Left eye: No discharge.  Cardiovascular:     Rate and Rhythm: Tachycardia present.  Pulmonary:     Effort: Pulmonary effort is normal. No respiratory distress.  Abdominal:     General: There is no distension.  Skin:    General: Skin is warm.     Coloration: Skin is pale.  Neurological:     General: No focal deficit present.     Mental Status: He is alert and oriented to person, place, and time.  Psychiatric:        Mood and Affect: Mood normal.        Behavior: Behavior normal.        Thought Content: Thought content normal.        Judgment: Judgment normal.    Flexi-Seal in place CBC:    BMET Recent Labs    10/13/21 0506 10/14/21 0126  NA 136 136  K 3.7 4.0  CL 111 108  CO2 15* 20*  GLUCOSE 101* 106*  BUN 32* 33*  CREATININE 1.43* 1.37*  CALCIUM 8.2* 8.0*     Liver Panel  Recent Labs    10/13/21 0506 10/14/21 0126  PROT 5.2* 5.0*  ALBUMIN 2.5* 2.4*  AST 62* 40  ALT 111* 88*  ALKPHOS 82 79  BILITOT QUANTITY NOT SUFFICIENT, UNABLE TO PERFORM TEST 0.3       Sedimentation Rate No results for input(s): ESRSEDRATE in the last 72 hours. C-Reactive Protein No results for input(s): CRP in the last 72 hours.  Micro Results: Recent Results (from the past 720 hour(s))  C Difficile Quick Screen w PCR reflex     Status: Abnormal   Collection Time: 10/09/21  7:09 PM   Specimen: Stool  Result Value Ref Range Status   C Diff antigen POSITIVE (A) NEGATIVE Final   C Diff toxin POSITIVE (A) NEGATIVE Final   C Diff interpretation Toxin producing C. difficile detected.  Final    Comment: RESULT CALLED TO, READ BACK BY AND VERIFIED WITH: RN  PAYTON BLANCHARD 10/09/21'@23'$ :20 BY TW Performed at [facility], [place] [address], [place], [place] [address]   Blood culture (routine x 2)     Status: None   Collection Time: 10/09/21  7:31 PM   Specimen: BLOOD  Result Value Ref Range Status   Specimen Description BLOOD BLOOD RIGHT WRIST  Final   Special Requests   Final    BOTTLES DRAWN AEROBIC AND ANAEROBIC Blood Culture adequate volume   Culture   Final    NO GROWTH 5 DAYS Performed at [facility], [place] [address] [place], [place] [address]    Report Status 10/14/2021 FINAL  Final  MRSA Next Gen by PCR, Nasal     Status: Abnormal   Collection Time: 10/10/21  5:17 AM   Specimen: Nasal Mucosa; Nasal Swab  Result Value Ref Range Status   MRSA by PCR Next Gen DETECTED (A) NOT DETECTED Final    Comment: RESULT CALLED TO, READ BACK BY AND VERIFIED WITH: F. Pricilla Loveless AT 8270 10/10/21 D. VANHOOK (NOTE) The GeneXpert MRSA Assay (FDA approved for NASAL specimens only), is one component of a comprehensive MRSA colonization surveillance program. It is not intended to diagnose MRSA infection nor to guide or monitor treatment for MRSA infections. Test performance is not FDA approved in patients less than 53 years old. Performed at [facility], [place] [address] [place], [place] [address]     Studies/Results: No results found.    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 male with recurrent [name] with shock physiology also with new onset CHF.  He has been on oral vancomycin and IV metronidazole.  Recurrent [name]:  Would continue oral vancomycin but can stop IV metronidazole     LOS: 5 days   Alcide Evener 10/14/2021, 1:15 PM

## 2021-10-15 DIAGNOSIS — I5041 Acute combined systolic (congestive) and diastolic (congestive) heart failure: Secondary | ICD-10-CM

## 2021-10-15 DIAGNOSIS — N1832 Chronic kidney disease, stage 3b: Secondary | ICD-10-CM

## 2021-10-15 DIAGNOSIS — N170 Acute kidney failure with tubular necrosis: Secondary | ICD-10-CM

## 2021-10-15 LAB — COMPREHENSIVE METABOLIC PANEL
ALT: 71 U/L — ABNORMAL HIGH (ref 0–44)
AST: 29 U/L (ref 15–41)
Albumin: 2.6 g/dL — ABNORMAL LOW (ref 3.5–5.0)
Alkaline Phosphatase: 76 U/L (ref 38–126)
Anion gap: 10 (ref 5–15)
BUN: 29 mg/dL — ABNORMAL HIGH (ref 8–23)
CO2: 21 mmol/L — ABNORMAL LOW (ref 22–32)
Calcium: 8.4 mg/dL — ABNORMAL LOW (ref 8.9–10.3)
Chloride: 108 mmol/L (ref 98–111)
Creatinine, Ser: 1.39 mg/dL — ABNORMAL HIGH (ref 0.61–1.24)
GFR, Estimated: 49 mL/min — ABNORMAL LOW (ref >60)
Glucose, Bld: 117 mg/dL — ABNORMAL HIGH (ref 70–99)
Potassium: 3.3 mmol/L — ABNORMAL LOW (ref 3.5–5.1)
Sodium: 139 mmol/L (ref 135–145)
Total Bilirubin: 0.4 mg/dL (ref 0.3–1.2)
Total Protein: 5.4 g/dL — ABNORMAL LOW (ref 6.5–8.1)

## 2021-10-15 LAB — CBC WITH DIFFERENTIAL/PLATELET
Abs Immature Granulocytes: 0.23 10*3/uL — ABNORMAL HIGH (ref 0.00–0.07)
Basophils Absolute: 0.1 10*3/uL (ref 0.0–0.1)
Basophils Relative: 1 %
Eosinophils Absolute: 0.3 10*3/uL (ref 0.0–0.5)
Eosinophils Relative: 3 %
HCT: 27.8 % — ABNORMAL LOW (ref 39.0–52.0)
Hemoglobin: 8.9 g/dL — ABNORMAL LOW (ref 13.0–17.0)
Immature Granulocytes: 3 %
Lymphocytes Relative: 24 %
Lymphs Abs: 1.8 10*3/uL (ref 0.7–4.0)
MCH: 25.6 pg — ABNORMAL LOW (ref 26.0–34.0)
MCHC: 32 g/dL (ref 30.0–36.0)
MCV: 79.9 fL — ABNORMAL LOW (ref 80.0–100.0)
Monocytes Absolute: 0.9 10*3/uL (ref 0.1–1.0)
Monocytes Relative: 12 %
Neutro Abs: 4.5 10*3/uL (ref 1.7–7.7)
Neutrophils Relative %: 57 %
Platelets: 193 10*3/uL (ref 150–400)
RBC: 3.48 MIL/uL — ABNORMAL LOW (ref 4.22–5.81)
RDW: 17.2 % — ABNORMAL HIGH (ref 11.5–15.5)
WBC: 7.8 10*3/uL (ref 4.0–10.5)
nRBC: 0 % (ref 0.0–0.2)

## 2021-10-15 MED ORDER — VANCOMYCIN HCL 125 MG PO CAPS
500.0000 mg | ORAL_CAPSULE | Freq: Four times a day (QID) | ORAL | Status: DC
  Administered 2021-10-15 – 2021-10-17 (×8): 500 mg via ORAL
  Filled 2021-10-15 (×9): qty 4

## 2021-10-15 MED ORDER — VANCOMYCIN HCL 125 MG PO CAPS: 125.0000 mg | ORAL_CAPSULE | ORAL | Status: DC

## 2021-10-15 MED ORDER — VANCOMYCIN HCL 125 MG PO CAPS: 125.0000 mg | ORAL_CAPSULE | Freq: Two times a day (BID) | ORAL | Status: DC

## 2021-10-15 MED ORDER — VANCOMYCIN HCL 125 MG PO CAPS: 125.0000 mg | ORAL_CAPSULE | Freq: Every day | ORAL | Status: DC

## 2021-10-15 MED ORDER — POTASSIUM CHLORIDE CRYS ER 20 MEQ PO TBCR
40.0000 meq | EXTENDED_RELEASE_TABLET | ORAL | Status: AC
  Administered 2021-10-15 (×2): 40 meq via ORAL
  Filled 2021-10-15 (×2): qty 2

## 2021-10-15 MED ORDER — FUROSEMIDE 20 MG PO TABS
20.0000 mg | ORAL_TABLET | Freq: Every day | ORAL | Status: DC
  Administered 2021-10-15 – 2021-10-17 (×3): 20 mg via ORAL
  Filled 2021-10-15 (×3): qty 1

## 2021-10-15 NOTE — Progress Notes (Signed)
\       Subjective: He is complaining of some numbness in his toes   Antibiotics:  Anti-infectives (From admission, onward)    Start     Dose/Rate Route Frequency Ordered Stop   12/11/21 1000  vancomycin (VANCOCIN) capsule 125 mg       See Hyperspace for full Linked Orders Report.   125 mg Oral Every 3 DAYS 10/15/21 1246 01/10/22 0959   11/13/21 1000  vancomycin (VANCOCIN) capsule 125 mg       See Hyperspace for full Linked Orders Report.   125 mg Oral Every other day 10/15/21 1246 12/11/21 0959   11/06/21 1000  vancomycin (VANCOCIN) capsule 125 mg       See Hyperspace for full Linked Orders Report.   125 mg Oral Daily 10/15/21 1246 11/13/21 0959   10/29/21 2200  vancomycin (VANCOCIN) capsule 125 mg       See Hyperspace for full Linked Orders Report.   125 mg Oral 2 times daily 10/15/21 1246 11/05/21 2159   10/15/21 1400  vancomycin (VANCOCIN) capsule 500 mg       See Hyperspace for full Linked Orders Report.   500 mg Oral 4 times daily 10/15/21 1246 10/29/21 1359   10/11/21 1800  vancomycin (VANCOCIN) capsule 500 mg  Status:  Discontinued        500 mg Oral Every 6 hours 10/11/21 1434 10/15/21 1246   10/11/21 1400  vancomycin (VANCOCIN) 50 mg/mL oral solution SOLN 500 mg  Status:  Discontinued        500 mg Oral 4 times daily 10/11/21 1027 10/11/21 1211   10/11/21 1400  vancomycin (VANCOCIN) capsule 500 mg  Status:  Discontinued        500 mg Oral 4 times daily 10/11/21 1211 10/11/21 1434   10/11/21 1215  vancomycin (VANCOCIN) capsule 500 mg        500 mg Oral  Once 10/11/21 1117 10/11/21 1152   10/11/21 1000  vancomycin (VANCOCIN) 50 mg/mL oral solution SOLN 500 mg  Status:  Discontinued        500 mg Per Tube 4 times daily 10/11/21 0750 10/11/21 1027   10/10/21 2200  vancomycin (VANCOREADY) IVPB 750 mg/150 mL  Status:  Discontinued        750 mg 150 mL/hr over 60 Minutes Intravenous Every 24 hours 10/09/21 2021 10/10/21 0110   10/10/21 1800  vancomycin (VANCOCIN) 500 mg  in sodium chloride irrigation 0.9 % 100 mL ENEMA  Status:  Discontinued        500 mg Rectal Every 6 hours 10/10/21 1406 10/11/21 0750   10/10/21 1300  cefTRIAXone (ROCEPHIN) 2 g in sodium chloride 0.9 % 100 mL IVPB  Status:  Discontinued        2 g 200 mL/hr over 30 Minutes Intravenous Every 24 hours 10/10/21 1159 10/14/21 1012   10/10/21 1130  vancomycin (VANCOCIN) 500 mg in sodium chloride irrigation 0.9 % 100 mL ENEMA        500 mg Rectal  Once 10/10/21 1030 10/10/21 1123   10/10/21 1000  ceFEPIme (MAXIPIME) 2 g in sodium chloride 0.9 % 100 mL IVPB  Status:  Discontinued        2 g 200 mL/hr over 30 Minutes Intravenous Every 12 hours 10/09/21 2021 10/10/21 0110   10/10/21 1000  vancomycin (VANCOCIN) capsule 500 mg  Status:  Discontinued        500 mg Oral Every 6 hours 10/10/21 0908 10/10/21 1406  10/10/21 0600  vancomycin (VANCOCIN) 500 mg in sodium chloride irrigation 0.9 % 100 mL ENEMA  Status:  Discontinued        500 mg Rectal Every 6 hours 10/10/21 0329 10/10/21 0908   10/10/21 0400  metroNIDAZOLE (FLAGYL) IVPB 500 mg  Status:  Discontinued        500 mg 100 mL/hr over 60 Minutes Intravenous Every 8 hours 10/10/21 0328 10/14/21 1320   10/10/21 0115  fidaxomicin (DIFICID) tablet 200 mg  Status:  Discontinued        200 mg Oral 2 times daily 10/10/21 0110 10/10/21 0327   10/09/21 2000  ceFEPIme (MAXIPIME) 2 g in sodium chloride 0.9 % 100 mL IVPB        2 g 200 mL/hr over 30 Minutes Intravenous  Once 10/09/21 1948 10/09/21 2149   10/09/21 2000  metroNIDAZOLE (FLAGYL) IVPB 500 mg        500 mg 100 mL/hr over 60 Minutes Intravenous  Once 10/09/21 1948 10/09/21 2149   10/09/21 2000  vancomycin (VANCOCIN) IVPB 1000 mg/200 mL premix  Status:  Discontinued        1,000 mg 200 mL/hr over 60 Minutes Intravenous  Once 10/09/21 1948 10/09/21 1952   10/09/21 2000  vancomycin (VANCOREADY) IVPB 1250 mg/250 mL        1,250 mg 166.7 mL/hr over 90 Minutes Intravenous  Once 10/09/21 1952  10/09/21 2336       Medications: Scheduled Meds:  aspirin EC  81 mg Oral Daily   carvedilol  3.125 mg Oral BID WC   fluticasone furoate-vilanterol  1 puff Inhalation Daily   furosemide  20 mg Oral Daily   heparin  5,000 Units Subcutaneous Q8H   mouth rinse  15 mL Mouth Rinse BID   QUEtiapine  25 mg Oral QHS   tamsulosin  0.4 mg Oral Daily   vancomycin  500 mg Oral QID   Followed by   Derrill Memo ON 10/29/2021] vancomycin  125 mg Oral BID   Followed by   Derrill Memo ON 11/06/2021] vancomycin  125 mg Oral Daily   Followed by   Derrill Memo ON 11/13/2021] vancomycin  125 mg Oral QODAY   Followed by   Derrill Memo ON 12/11/2021] vancomycin  125 mg Oral Q3 days   Continuous Infusions:   PRN Meds:.acetaminophen **OR** acetaminophen    Objective: Weight change:   Intake/Output Summary (Last 24 hours) at 10/15/2021 1534 Last data filed at 10/15/2021 1300 Gross per 24 hour  Intake 737 ml  Output 3400 ml  Net -2663 ml    Blood pressure 126/82, pulse 63, temperature 97.9 F (36.6 C), resp. rate 18, weight 68 kg, SpO2 98 %. Temp:  [97.6 F (36.4 C)-98.4 F (36.9 C)] 97.9 F (36.6 C) (03/05 0906) Pulse Rate:  [63-93] 63 (03/05 1440) Resp:  [17-19] 18 (03/05 1440) BP: (107-142)/(71-94) 126/82 (03/05 1440) SpO2:  [96 %-100 %] 98 % (03/05 1440)  Physical Exam: Physical Exam Constitutional:      Appearance: He is well-developed.  HENT:     Head: Normocephalic and atraumatic.  Eyes:     Conjunctiva/sclera: Conjunctivae normal.  Cardiovascular:     Rate and Rhythm: Normal rate and regular rhythm.  Pulmonary:     Effort: Pulmonary effort is normal. No respiratory distress.     Breath sounds: Normal breath sounds. No stridor. No wheezing.  Abdominal:     Palpations: Abdomen is soft.  Musculoskeletal:        General: Normal range of motion.  Cervical back: Normal range of motion and neck supple.  Skin:    General: Skin is warm and dry.     Findings: No erythema or rash.  Neurological:      General: No focal deficit present.     Mental Status: He is alert and oriented to person, place, and time.  Psychiatric:        Mood and Affect: Mood normal.        Behavior: Behavior normal.        Thought Content: Thought content normal.        Judgment: Judgment normal.    Flexi-Seal in place CBC:    BMET Recent Labs    10/14/21 0126 10/15/21 0150  NA 136 139  K 4.0 3.3*  CL 108 108  CO2 20* 21*  GLUCOSE 106* 117*  BUN 33* 29*  CREATININE 1.37* 1.39*  CALCIUM 8.0* 8.4*      Liver Panel  Recent Labs    10/14/21 0126 10/15/21 0150  PROT 5.0* 5.4*  ALBUMIN 2.4* 2.6*  AST 40 29  ALT 88* 71*  ALKPHOS 79 76  BILITOT 0.3 0.4        Sedimentation Rate No results for input(s): ESRSEDRATE in the last 72 hours. C-Reactive Protein No results for input(s): CRP in the last 72 hours.  Micro Results: Recent Results (from the past 720 hour(s))  C Difficile Quick Screen w PCR reflex     Status: Abnormal   Collection Time: 10/09/21  7:09 PM   Specimen: Stool  Result Value Ref Range Status   C Diff antigen POSITIVE (A) NEGATIVE Final   C Diff toxin POSITIVE (A) NEGATIVE Final   C Diff interpretation Toxin producing C. difficile detected.  Final    Comment: RESULT CALLED TO, READ BACK BY AND VERIFIED WITH: RN PAYTON BLANCHARD 10/09/21'@23'$ :20 BY TW Performed at [facility], [place] [address], [place], [place] [address]   Blood culture (routine x 2)     Status: None   Collection Time: 10/09/21  7:31 PM   Specimen: BLOOD  Result Value Ref Range Status   Specimen Description BLOOD BLOOD RIGHT WRIST  Final   Special Requests   Final    BOTTLES DRAWN AEROBIC AND ANAEROBIC Blood Culture adequate volume   Culture   Final    NO GROWTH 5 DAYS Performed at [facility], [place] [address], [place], [place] [address]    Report Status 10/14/2021 FINAL  Final  MRSA Next Gen by PCR, Nasal     Status: Abnormal   Collection Time: 10/10/21  5:17 AM   Specimen: Nasal  Mucosa; Nasal Swab  Result Value Ref Range Status   MRSA by PCR Next Gen DETECTED (A) NOT DETECTED Final    Comment: RESULT CALLED TO, READ BACK BY AND VERIFIED WITH: F. Pricilla Loveless AT 1660 10/10/21 D. VANHOOK (NOTE) The GeneXpert MRSA Assay (FDA approved for NASAL specimens only), is one component of a comprehensive MRSA colonization surveillance program. It is not intended to diagnose MRSA infection nor to guide or monitor treatment for MRSA infections. Test performance is not FDA approved in patients less than 62 years old. Performed at [facility], [place] [address], [place], [place] [address]     Studies/Results: No results found.    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 male w recurrent [name] with shock physiology and new onset heart failure   Current [name]: Improved  Continue vancomycin taper which I have entered into the order per C. difficile order set:  Vancomycin 500 mg 4 times daily x14 days total then Vancomycin 125 mg twice daily x7 days ""                               Daily x7 days  ""                                 Every other day x28 days  ""                                Every 3 days x 28 days   Bunnie JIBRI SCHRIEFER has an appointment on October 30, 2021 at 3:45 PM with Dr. Tommy Medal  The [facility] is located in the [facility] at  [place] in [place].  [address], which is located to the left of the elevators.  Phone: [phone]  Fax: [phone]  [email or web address]  He should arrive 30 minutes prior to his appointment.  I am comfortable discharge from my standpoint.  I will sign off for now please call with further questions      LOS: 6 days   Alcide Evener 10/15/2021, 3:34 PM

## 2021-10-15 NOTE — Progress Notes (Signed)
PROGRESS NOTE    Leon Estes  LOV:[id number] DOB: 09-07-1930 DOA: 10/09/2021 PCP: Venia Carbon, MD   Brief Narrative:  He presented to [facility] ED 2/27 with complaints of AMS, fever, and diarrhea x  24 hours. Upon arrival to the ED he was found to be febrile and was not entirely oriented. There was concern for sepsis. He was started on antibiotics and given volume. Stool testing was positive for c. Difficile antigen and toxin.  He was admitted to the hospitalist service for treatment of C. Dif colitis. However, prior to being moved from the ED he developed hypotension with MAPs into the 50s refractory to an additional liter bolus. He was started on norepinephrine and he was admitted under ICU.  Significant Hospital Events: Including procedures, antibiotic start and stop dates in addition to other pertinent events   2/27: Admitted to [facility]  2/28: Transferred to ICU for hypotension. Levophed initiated. CT of abdomen and pelvis reveal acute pancolitis most likely infectious. Small bowel enteritis also.  3/1 off norepi. 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 and right lower lobe pneumonia/aspiration pneumonia: Shock resolved as of 10/11/2021.  Off of Levophed.  He completed 5 days of Rocephin and Flagyl.  ID was consulted who discontinued Flagyl but he remains on high-dose of oral vancomycin.  He will benefit from stool transplant down the road once he is completely treated.  Lactic acidosis has resolved as well.  He has had only 150 cc of stool output and Flexseal.  We will remove today.  We will observe her overnight.  Potentially discharge tomorrow.  Acute systolic CHF: New diagnosis of acute systolic CHF during this admission with a EF of 30%.  Patient appears euvolemic.  Cardiology is on board and plans to medically manage due to multiple medical problems and dementia.  He is now on oral Lasix.  History of COPD: Not in exacerbation.  Continue home medications/bronchodilators.  Acute septic/toxic encephalopathy: Secondary to infection and sepsis.  Resolved.  Now fully alert and oriented.  AKI: Baseline creatinine around 1.1.  Presented with 1.32, peaked at 2.01 on 10/11/2021.  Now improving.  Down to 1. 37 today  Metabolic acidosis: CO2 improving.  Will monitor.    Hypokalemia: We will replace  Elevated LFTs: Likely due to hepatic congestion due to CHF.  Improving.  Bilirubin normal.  Anemia of chronic disease: Stable.  History of BPH: Continue Flomax.  Chronic peripheral neuropathy: Holding Neurontin for now.  Physical deconditioning: PT OT consulted.  DVT prophylaxis: heparin injection 5,000 Units Start: 10/10/21 1400   Code Status: DNR  Family Communication:  None present  at bedside.    Status is: Inpatient Remains inpatient appropriate because: Still needs inpatient medical management  Estimated body mass index is 23.48 kg/m as calculated from the following:   Height as of 09/28/21: '5\' 7"'$  (1.702 m).   Weight as of this encounter: 68 kg.    Nutritional  Assessment: Body mass index is 23.48 kg/m.Marland Kitchen Seen by dietician.  I agree with the assessment and plan as outlined below: Nutrition Status:        . Skin Assessment: I have examined the patient's skin and I agree with the wound assessment as performed by the wound care RN as outlined below:    Consultants:  Cardiology ID Procedures:  None  Antimicrobials:  Anti-infectives (From admission, onward)    Start     Dose/Rate Route Frequency Ordered Stop   10/11/21 1800  vancomycin (VANCOCIN) capsule 500 mg        500 mg Oral Every 6 hours 10/11/21 1434     10/11/21 1400  vancomycin (VANCOCIN) 50 mg/mL oral solution SOLN 500 mg  Status:  Discontinued        500 mg Oral 4 times daily 10/11/21 1027 10/11/21 1211   10/11/21 1400  vancomycin (VANCOCIN) capsule 500 mg  Status:  Discontinued        500 mg Oral 4 times daily 10/11/21 1211 10/11/21 1434   10/11/21 1215  vancomycin (VANCOCIN) capsule 500 mg        500 mg Oral  Once 10/11/21 1117 10/11/21 1152   10/11/21 1000  vancomycin (VANCOCIN) 50 mg/mL oral solution SOLN 500 mg  Status:  Discontinued        500 mg Per Tube 4 times daily 10/11/21 0750 10/11/21 1027   10/10/21 2200  vancomycin (VANCOREADY) IVPB 750 mg/150 mL  Status:  Discontinued        750 mg 150 mL/hr over 60 Minutes Intravenous Every 24 hours 10/09/21 2021 10/10/21 0110   10/10/21 1800  vancomycin (VANCOCIN) 500 mg in sodium chloride irrigation 0.9 % 100 mL ENEMA  Status:  Discontinued        500 mg Rectal Every 6 hours 10/10/21 1406 10/11/21 0750   10/10/21 1300  cefTRIAXone (ROCEPHIN) 2 g in sodium chloride 0.9 % 100 mL IVPB  Status:  Discontinued        2 g 200 mL/hr over 30 Minutes Intravenous Every 24 hours 10/10/21 1159 10/14/21 1012   10/10/21 1130  vancomycin (VANCOCIN) 500 mg in sodium chloride irrigation 0.9 % 100 mL ENEMA        500 mg Rectal  Once 10/10/21 1030 10/10/21 1123   10/10/21 1000  ceFEPIme (MAXIPIME) 2 g in sodium chloride 0.9 % 100 mL IVPB   Status:  Discontinued        2 g 200 mL/hr over 30 Minutes Intravenous Every 12 hours 10/09/21 2021 10/10/21 0110   10/10/21 1000  vancomycin (VANCOCIN) capsule 500 mg  Status:  Discontinued        500 mg Oral Every 6 hours 10/10/21 0908 10/10/21 1406   10/10/21 0600  vancomycin (VANCOCIN) 500 mg in sodium chloride irrigation 0.9 % 100 mL ENEMA  Status:  Discontinued        500 mg Rectal Every 6 hours 10/10/21 0329 10/10/21 0908   10/10/21 0400  metroNIDAZOLE (FLAGYL) IVPB 500 mg  Status:  Discontinued        500 mg 100 mL/hr over 60 Minutes Intravenous Every 8 hours 10/10/21 0328 10/14/21 1320  10/10/21 0115  fidaxomicin (DIFICID) tablet 200 mg  Status:  Discontinued        200 mg Oral 2 times daily 10/10/21 0110 10/10/21 0327   10/09/21 2000  ceFEPIme (MAXIPIME) 2 g in sodium chloride 0.9 % 100 mL IVPB        2 g 200 mL/hr over 30 Minutes Intravenous  Once 10/09/21 1948 10/09/21 2149   10/09/21 2000  metroNIDAZOLE (FLAGYL) IVPB 500 mg        500 mg 100 mL/hr over 60 Minutes Intravenous  Once 10/09/21 1948 10/09/21 2149   10/09/21 2000  vancomycin (VANCOCIN) IVPB 1000 mg/200 mL premix  Status:  Discontinued        1,000 mg 200 mL/hr over 60 Minutes Intravenous  Once 10/09/21 1948 10/09/21 1952   10/09/21 2000  vancomycin (VANCOREADY) IVPB 1250 mg/250 mL        1,250 mg 166.7 mL/hr over 90 Minutes Intravenous  Once 10/09/21 1952 10/09/21 2336         Subjective: Seen and examined.  He has no complaints.  Objective: Vitals:   10/14/21 1649 10/14/21 1844 10/14/21 2042 10/15/21 0522  BP: (!) 136/94 131/82 107/80 (!) 142/92  Pulse: 82 65 93 70  Resp: '18  17 18  '$ Temp: 98.4 F (36.9 C)  97.6 F (36.4 C) 98.1 F (36.7 C)  TempSrc:   Oral Oral  SpO2: 100%  98% 99%  Weight:        Intake/Output Summary (Last 24 hours) at 10/15/2021 0752 Last data filed at 10/15/2021 0600 Gross per 24 hour  Intake 680 ml  Output 4000 ml  Net -3320 ml    Filed Weights   10/12/21 0415  10/12/21 2232 10/13/21 0239  Weight: 67.9 kg 67.5 kg 68 kg    Examination:  General exam: Appears calm and comfortable  Respiratory system: Clear to auscultation. Respiratory effort normal. Cardiovascular system: S1 & S2 heard, RRR. No JVD, murmurs, rubs, gallops or clicks. No pedal edema. Gastrointestinal system: Abdomen is nondistended, soft and nontender. No organomegaly or masses felt. Normal bowel sounds heard. Central nervous system: Alert and oriented. No focal neurological deficits. Extremities: Symmetric 5 x 5 power. Skin: No rashes, lesions or ulcers.  Psychiatry: Judgement and insight appear normal. Mood & affect appropriate.    Data Reviewed: I have personally reviewed following labs and imaging studies  CBC: Recent Labs  Lab 10/11/21 0102 10/11/21 0641 10/12/21 0418 10/13/21 1200 10/14/21 0126 10/15/21 0150  WBC 11.1*  --  15.9* 11.8* 11.5* 7.8  NEUTROABS 9.3*  --  13.7* 9.8* 8.5* 4.5  HGB 8.5* 8.5* 7.9* 9.3* 8.2* 8.9*  HCT 25.7* 25.0* 24.2* 28.5* 25.4* 27.8*  MCV 78.8*  --  78.8* 79.8* 78.2* 79.9*  PLT 181  --  170 169 187 517    Basic Metabolic Panel: Recent Labs  Lab 10/09/21 1931 10/09/21 1949 10/10/21 0444 10/11/21 0102 10/11/21 0641 10/12/21 0418 10/13/21 0506 10/14/21 0126 10/15/21 0150  NA 134*   < >  --  134* 135 137 136 136 139  K 3.4*   < >  --  4.8 4.7 3.4* 3.7 4.0 3.3*  CL 105   < >  --  105  --  105 111 108 108  CO2 20*   < >  --  16*  --  18* 15* 20* 21*  GLUCOSE 102*   < >  --  85  --  92 101* 106* 117*  BUN 24*   < >  --  35*  --  34* 32* 33* 29*  CREATININE 1.32*   < >  --  2.01*  --  1.56* 1.43* 1.37* 1.39*  CALCIUM 8.2*   < >  --  7.8*  --  8.0* 8.2* 8.0* 8.4*  MG 1.5*  --  1.5*  --   --  2.4  --  1.8  --   PHOS  --   --  3.5  --   --   --   --   --   --    < > = values in this interval not displayed.    GFR: Estimated Creatinine Clearance: 34.3 mL/min (A) (by C-G formula based on SCr of 1.39 mg/dL (H)). Liver Function  Tests: Recent Labs  Lab 10/11/21 0102 10/12/21 0418 10/13/21 0506 10/14/21 0126 10/15/21 0150  AST 77* 94* 62* 40 29  ALT 86* 119* 111* 88* 71*  ALKPHOS 61 70 82 79 76  BILITOT 0.8 0.6 QUANTITY NOT SUFFICIENT, UNABLE TO PERFORM TEST 0.3 0.4  PROT 5.0* 5.2* 5.2* 5.0* 5.4*  ALBUMIN 2.5* 2.4* 2.5* 2.4* 2.6*    No results for input(s): LIPASE, AMYLASE in the last 168 hours. Recent Labs  Lab 10/09/21 1931  AMMONIA 45*    Coagulation Profile: Recent Labs  Lab 10/09/21 1931  INR 1.1    Cardiac Enzymes: No results for input(s): CKTOTAL, CKMB, CKMBINDEX, TROPONINI in the last 168 hours. BNP (last 3 results) No results for input(s): PROBNP in the last 8760 hours. HbA1C: No results for input(s): HGBA1C in the last 72 hours. CBG: Recent Labs  Lab 10/12/21 0807 10/12/21 0835 10/12/21 1218 10/12/21 1530 10/12/21 1952  GLUCAP 64* 97 108* 95 164*    Lipid Profile: No results for input(s): CHOL, HDL, LDLCALC, TRIG, CHOLHDL, LDLDIRECT in the last 72 hours. Thyroid Function Tests: No results for input(s): TSH, T4TOTAL, FREET4, T3FREE, THYROIDAB in the last 72 hours. Anemia Panel: No results for input(s): VITAMINB12, FOLATE, FERRITIN, TIBC, IRON, RETICCTPCT in the last 72 hours. Sepsis Labs: Recent Labs  Lab 10/13/21 1200 10/14/21 0938 10/14/21 1231 10/14/21 1612  LATICACIDVEN 3.5* 1.3 1.2 1.2     Recent Results (from the past 240 hour(s))  C Difficile Quick Screen w PCR reflex     Status: Abnormal   Collection Time: 10/09/21  7:09 PM   Specimen: Stool  Result Value Ref Range Status   C Diff antigen POSITIVE (A) NEGATIVE Final   C Diff toxin POSITIVE (A) NEGATIVE Final   C Diff interpretation Toxin producing C. difficile detected.  Final    Comment: RESULT CALLED TO, READ BACK BY AND VERIFIED WITH: RN PAYTON BLANCHARD 10/09/21'@23'$ :20 BY TW Performed at [facility], [place] [address] [place], [place] [address]   Blood culture (routine x 2)     Status: None    Collection Time: 10/09/21  7:31 PM   Specimen: BLOOD  Result Value Ref Range Status   Specimen Description BLOOD BLOOD RIGHT WRIST  Final   Special Requests   Final    BOTTLES DRAWN AEROBIC AND ANAEROBIC Blood Culture adequate volume   Culture   Final    NO GROWTH 5 DAYS Performed at [facility], [place] [address] [place], [place] [address]    Report Status 10/14/2021 FINAL  Final  MRSA Next Gen by PCR, Nasal     Status: Abnormal   Collection Time: 10/10/21  5:17 AM   Specimen: Nasal Mucosa; Nasal Swab  Result Value Ref Range Status  MRSA by PCR Next Gen DETECTED (A) NOT DETECTED Final    Comment: RESULT CALLED TO, READ BACK BY AND VERIFIED WITH: F. Pricilla Loveless AT 8144 10/10/21 D. VANHOOK (NOTE) The GeneXpert MRSA Assay (FDA approved for NASAL specimens only), is one component of a comprehensive MRSA colonization surveillance program. It is not intended to diagnose MRSA infection nor to guide or monitor treatment for MRSA infections. Test performance is not FDA approved in patients less than 79 years old. Performed at [facility], [place] [address], [place], [place] [address]       Radiology Studies: No results found.  Scheduled Meds:  aspirin EC  81 mg Oral Daily   carvedilol  3.125 mg Oral BID WC   fluticasone furoate-vilanterol  1 puff Inhalation Daily   furosemide  40 mg Oral Daily   heparin  5,000 Units Subcutaneous Q8H   mouth rinse  15 mL Mouth Rinse BID   potassium chloride  40 mEq Oral Q4H   QUEtiapine  25 mg Oral QHS   tamsulosin  0.4 mg Oral Daily   vancomycin  500 mg Oral Q6H   Continuous Infusions:     LOS: 6 days   Time spent: 25 minutes  Darliss Cheney, MD [facility]  10/15/2021, 7:52 AM  Please page via [facility] [place] and do not message via secure chat for urgent patient care matters. Secure chat can be used for non urgent patient care matters.  How to contact the [facility] Attending or Consulting provider Post or covering provider during after  hours [place], for this patient?  Check the care team in [facility] and look for a) attending/consulting TR[facility] provider listed and b) the [facility] team listed. Page or secure chat [phone]. Log into [email or web address] and use [place]'s universal password to access. If you do not have the password, please contact the hospital operator. Locate the [facility] provider you are looking for under [facility] and page to a number that you can be directly reached. If you still have difficulty reaching the provider, please page the [facility] (Director on Call) for the Hospitalists listed on amion for assistance.

## 2021-10-15 NOTE — Progress Notes (Signed)
? ?Progress Note ? ?Patient Name: Leon Estes ?Date of Encounter: 10/15/2021 ? ?[facility] Cardiologist: Mertie Moores, MD  ? ?Subjective  ? ?Net negative 3.3L yesterday, -6.8 L on admission.  Creatinine stable (2.0 > 1.56 >1.43 >1.37>1.39).  BP improved today, 135/71.  He denies any chest pain or dyspnea ? ?Inpatient Medications  ?  ?Scheduled Meds: ? aspirin EC  81 mg Oral Daily  ? carvedilol  3.125 mg Oral BID WC  ? fluticasone furoate-vilanterol  1 puff Inhalation Daily  ? furosemide  20 mg Oral Daily  ? heparin  5,000 Units Subcutaneous Q8H  ? mouth rinse  15 mL Mouth Rinse BID  ? potassium chloride  40 mEq Oral Q4H  ? QUEtiapine  25 mg Oral QHS  ? tamsulosin  0.4 mg Oral Daily  ? vancomycin  500 mg Oral Q6H  ? ?Continuous Infusions: ? ? ?PRN Meds: ?acetaminophen **OR** acetaminophen  ? ?Vital Signs  ?  ?Vitals:  ? 10/14/21 1844 10/14/21 2042 10/15/21 0522 10/15/21 0906  ?BP: 131/82 107/80 (!) 142/92 135/71  ?Pulse: 65 93 70 84  ?Resp:  '17 18 19  '$ ?Temp:  97.6 ?F (36.4 ?C) 98.1 ?F (36.7 ?C) 97.9 ?F (36.6 ?C)  ?TempSrc:  Oral Oral   ?SpO2:  98% 99% 96%  ?Weight:      ? ? ?Intake/Output Summary (Last 24 hours) at 10/15/2021 1153 ?Last data filed at 10/15/2021 0900 ?Gross per 24 hour  ?Intake 800 ml  ?Output 4200 ml  ?Net -3400 ml  ? ? ?Last 3 Weights 10/13/2021 10/12/2021 10/12/2021  ?Weight (lbs) 149 lb 14.6 oz 148 lb 13 oz 149 lb 11.1 oz  ?Weight (kg) 68 kg 67.5 kg 67.9 kg  ?   ? ?Telemetry  ?  ?Afib, PVCs, rate 60-80s  - Personally Reviewed ? ?ECG  ?  ?No new ECG - Personally Reviewed ? ?Physical Exam  ? ?GEN: No acute distress.   ?Neck: No JVD ?Cardiac: RRR, no murmurs, rubs, or gallops.  ?Respiratory: Clear to auscultation bilaterally. ?GI: Soft, nontender ?MS: No edema; No deformity. ?Neuro:  Nonfocal  ?Psych: Normal affect  ? ?Labs  ?  ?High Sensitivity Troponin:  No results for input(s): TROPONINIHS in the last 720 hours.   ?Chemistry ?Recent Labs  ?Lab 10/10/21 ?0444 10/11/21 ?0102 10/12/21 ?0418  10/13/21 ?7564 10/14/21 ?0126 10/15/21 ?0150  ?NA  --    < > 137 136 136 139  ?K  --    < > 3.4* 3.7 4.0 3.3*  ?CL  --    < > 105 111 108 108  ?CO2  --    < > 18* 15* 20* 21*  ?GLUCOSE  --    < > 92 101* 106* 117*  ?BUN  --    < > 34* 32* 33* 29*  ?CREATININE  --    < > 1.56* 1.43* 1.37* 1.39*  ?CALCIUM  --    < > 8.0* 8.2* 8.0* 8.4*  ?MG 1.5*  --  2.4  --  1.8  --   ?PROT  --    < > 5.2* 5.2* 5.0* 5.4*  ?ALBUMIN  --    < > 2.4* 2.5* 2.4* 2.6*  ?AST  --    < > 94* 62* 40 29  ?ALT  --    < > 119* 111* 88* 71*  ?ALKPHOS  --    < > 70 82 79 76  ?BILITOT  --    < > 0.6 QUANTITY NOT SUFFICIENT,  UNABLE TO PERFORM TEST 0.3 0.4  ?GFRNONAA  --    < > 42* 47* 50* 49*  ?ANIONGAP  --    < > '14 10 8 10  '$ ? < > = values in this interval not displayed.  ? ?  ?Lipids No results for input(s): CHOL, TRIG, HDL, LABVLDL, LDLCALC, CHOLHDL in the last 168 hours.  ?Hematology ?Recent Labs  ?Lab 10/13/21 ?1200 10/14/21 ?0126 10/15/21 ?0150  ?WBC 11.8* 11.5* 7.8  ?RBC 3.57* 3.25* 3.48*  ?HGB 9.3* 8.2* 8.9*  ?HCT 28.5* 25.4* 27.8*  ?MCV 79.8* 78.2* 79.9*  ?MCH 26.1 25.2* 25.6*  ?MCHC 32.6 32.3 32.0  ?RDW 17.2* 17.2* 17.2*  ?PLT 169 187 193  ? ? ?Thyroid No results for input(s): TSH, FREET4 in the last 168 hours.  ?BNPNo results for input(s): BNP, PROBNP in the last 168 hours.  ?DDimer No results for input(s): DDIMER in the last 168 hours.  ? ?Radiology  ?  ?No results found. ? ?Cardiac Studies  ? ?Echo 10/10/21: ? 1. Left ventricular ejection fraction, by estimation, is 30%. The left  ?ventricle has moderately decreased function. The left ventricle  ?demonstrates global hypokinesis. There is moderate left ventricular  ?hypertrophy. Left ventricular diastolic parameters  ? are indeterminate.  ? 2. Right ventricular systolic function is moderately reduced. The right  ?ventricular size is moderately enlarged. There is normal pulmonary artery  ?systolic pressure.  ? 3. Left atrial size was moderately dilated.  ? 4. Right atrial size was severely  dilated.  ? 5. The mitral valve is normal in structure. No evidence of mitral valve  ?regurgitation. No evidence of mitral stenosis.  ? 6. The aortic valve is tricuspid. Aortic valve regurgitation is not  ?visualized. Aortic valve sclerosis is present, with no evidence of aortic  ?valve stenosis.  ? 7. The inferior vena cava is dilated in size with <50% respiratory  ?variability, suggesting right atrial pressure of 15 mmHg.  ? ?Patient Profile  ?   ?88 y.o. male with a history of CAD s/p stenting to RCA in 1997, paroxysmal atrial fibrillation not on anticoagulation, hypertension, hyperlipidemia, CKD stage III, chronic anemia, COPD, BPH, and recurrent C. Diff colitis who is being seen for the evaluation of CHF ? ?Assessment & Plan  ?  ?New Onset Systolic CHF: Patient presented with fever, diarrhea, and altered mental status and was admitted for sepsis secondary to recurrent C. Diff colitis. Echo this admission showed LVEF of 30% with global hypokinesis and moderate LVH.  RV moderately enlarged with moderately reduced systolic function but PASP normal.  ?-Received IV fluid resuscitation on admission and subsequently became hypervolemic.  Diuresed with IV Lasix 40 mg twice daily.  Volume status improved, will convert to PO lasix 40 mg yesterday.  Negative 3L yesterday on PO lasix 40 mg daily, will reduce to 20 mg daily for goal net even to slightly negative ?- BP has significantly improved, will start low dose coreg ?- No ACE/ARB/ARNI or MRA due to renal function. May be able to add ARB as outpatient if renal function returns to baseline and BP allows. ?- Continue to monitor daily weights, strict I/Os, and renal function. ?- Maintain K greater than 4, mag greater than 2 ?- Drop in EF may be secondary to sepsis. However, cannot rule out progressive CAD. Given advanced age, possible underlying dementia, and other comorbidities, he is not a good cardiac cath candidate. Would continue to treat medically.  We will plan for  repeat echo as outpatient  to reassess EF function after starting GDMT. ?  ?Paroxysmal Atrial Fibrillation: Presented in sinus tachycardia with frequent ectopy but now in rate controlled atrial fibrillation. CHA2DS2-VASc = 5 (CAD, CHF, HTN, age x2). Previously not felt to be a anticoagulation candidate due to high fall risk. Also has chronic anemia with hemoglobin of 7.9 currently ?- Rate controlled with rates in the 60s to 80s. ?- Add low dose coreg as above ?  ?Non-Sustained VT ?Patient having frequent PVCs and short runs of NSVT, longest run being 13 beats. ?- Add coreg 3.125 mg BID ?  ?CAD ?S/p remote stenting to RCA in 1997. ?- No chest pain.  ?- Previously on aspirin but appears this was stopped.  Will restart ASA 81 mg daily ?- Continue statin. ?  ?Hypertension ?History of hypertension but hypotensive this admission in the setting of sepsis.  Required Levophed but this was able to be stopped on 10/10/2021. ?- BP stable.  ?- Continue to monitor closely. ?  ?Acute on Chronic CKD Stage II ?Creatinine 1.32 on admission and peaked at 2.01 on 10/11/2021.  Baseline around 0.7 to 0.9. ?- Stable at 1.39 today ?- Continue to avoid nephrotoxic agents.  ?- Continue to monitor closely. ?  ?Otherwise, per primary team: ?- Sepsis secondary to recurrent C. Diff colitis ?- Acute metabolic encephalopathy ?- Aspiration pneumonia ?- COPD ?- Chronic anemia  ? ?For questions or updates, please contact [place] ?Please consult [email or web address] for contact info under  ? ?  ?   ?Signed, ?Donato Heinz, MD  ?10/15/2021, 11:53 AM   ? ?

## 2021-10-16 ENCOUNTER — Other Ambulatory Visit (HOSPITAL_COMMUNITY): Payer: Self-pay

## 2021-10-16 ENCOUNTER — Encounter: Payer: PPO | Admitting: Internal Medicine

## 2021-10-16 DIAGNOSIS — I2510 Atherosclerotic heart disease of native coronary artery without angina pectoris: Secondary | ICD-10-CM

## 2021-10-16 DIAGNOSIS — E7800 Pure hypercholesterolemia, unspecified: Secondary | ICD-10-CM

## 2021-10-16 LAB — BASIC METABOLIC PANEL
Anion gap: 7 (ref 5–15)
BUN: 22 mg/dL (ref 8–23)
CO2: 22 mmol/L (ref 22–32)
Calcium: 8.4 mg/dL — ABNORMAL LOW (ref 8.9–10.3)
Chloride: 110 mmol/L (ref 98–111)
Creatinine, Ser: 1.28 mg/dL — ABNORMAL HIGH (ref 0.61–1.24)
GFR, Estimated: 54 mL/min — ABNORMAL LOW (ref >60)
Glucose, Bld: 93 mg/dL (ref 70–99)
Potassium: 3.4 mmol/L — ABNORMAL LOW (ref 3.5–5.1)
Sodium: 139 mmol/L (ref 135–145)

## 2021-10-16 LAB — MAGNESIUM: Magnesium: 1.8 mg/dL (ref 1.7–2.4)

## 2021-10-16 MED ORDER — VANCOMYCIN HCL 250 MG PO CAPS
500.0000 mg | ORAL_CAPSULE | Freq: Four times a day (QID) | ORAL | 0 refills | Status: AC
Start: 2021-10-16 — End: 2021-10-30
  Filled 2021-10-16: qty 100, 13d supply, fill #0

## 2021-10-16 MED ORDER — POTASSIUM CHLORIDE CRYS ER 20 MEQ PO TBCR
40.0000 meq | EXTENDED_RELEASE_TABLET | Freq: Once | ORAL | Status: AC
  Administered 2021-10-16: 40 meq via ORAL
  Filled 2021-10-16: qty 2

## 2021-10-16 MED ORDER — VANCOMYCIN HCL 125 MG PO CAPS
ORAL_CAPSULE | ORAL | 0 refills | Status: AC
  Filled 2021-10-16: qty 44, 70d supply, fill #0

## 2021-10-16 NOTE — TOC Benefit Eligibility Note (Signed)
Patient Advocate Encounter ?  ?Insurance verification completed.   ?  ?The patient is currently admitted and upon discharge could be taking VANCOMYCIN '500MG'$ . ?  ?The current 14 day co-pay is, $100.  ? ?The patient is currently admitted and upon discharge could be taking VANCOMYCIN '125MG'$  TAPER. ?  ?The current 70 day co-pay is, $90.87.  ? ?The patient is insured through [employer]. ? ? ?  ? ?

## 2021-10-16 NOTE — Progress Notes (Signed)
PT Cancellation Note ? ?Patient Details ?Name: BOCEPHUS CALI ?MRN: [id number] ?DOB: [birth year] ? ? ?Cancelled Treatment:    Reason Eval/Treat Not Completed: Other (comment) (Ploitely declining; reports had just gotten comfortable back in the bed; Promises he will walk tomorrow) ? ?Roney Marion, PT  ?Acute Rehabilitation Services ?Pager [phone] ?Office [phone] ? ?[place] ?10/16/2021, 2:15 PM ?

## 2021-10-16 NOTE — Progress Notes (Addendum)
? ?Progress Note ? ?Patient Name: Leon Estes ?Date of Encounter: 10/16/2021 ? ?[facility] Cardiologist: Mertie Moores, MD  ? ?Subjective  ? ?Denies any chest pain or SOB.  He put out 1.5L yesterday and is net neg 7.6L since admit. ? ?Inpatient Medications  ?  ?Scheduled Meds: ? aspirin EC  81 mg Oral Daily  ? carvedilol  3.125 mg Oral BID WC  ? fluticasone furoate-vilanterol  1 puff Inhalation Daily  ? furosemide  20 mg Oral Daily  ? heparin  5,000 Units Subcutaneous Q8H  ? mouth rinse  15 mL Mouth Rinse BID  ? QUEtiapine  25 mg Oral QHS  ? tamsulosin  0.4 mg Oral Daily  ? vancomycin  500 mg Oral QID  ? Followed by  ? [START ON 10/29/2021] vancomycin  125 mg Oral BID  ? Followed by  ? [START ON 11/06/2021] vancomycin  125 mg Oral Daily  ? Followed by  ? [START ON 11/13/2021] vancomycin  125 mg Oral QODAY  ? Followed by  ? [START ON 12/11/2021] vancomycin  125 mg Oral Q3 days  ? ?Continuous Infusions: ? ? ?PRN Meds: ?acetaminophen **OR** acetaminophen  ? ?Vital Signs  ?  ?Vitals:  ? 10/15/21 1440 10/15/21 1811 10/15/21 2127 10/15/21 2127  ?BP: 126/82 120/67  121/89  ?Pulse: 63 71  81  ?Resp: '18 19  18  '$ ?Temp:  98.1 ?F (36.7 ?C)  98 ?F (36.7 ?C)  ?TempSrc:    Oral  ?SpO2: 98% 98%  100%  ?Weight:   61.8 kg   ? ? ?Intake/Output Summary (Last 24 hours) at 10/16/2021 0759 ?Last data filed at 10/16/2021 0411 ?Gross per 24 hour  ?Intake 777 ml  ?Output 1600 ml  ?Net -823 ml  ? ? ?Last 3 Weights 10/15/2021 10/13/2021 10/12/2021  ?Weight (lbs) 136 lb 3.9 oz 149 lb 14.6 oz 148 lb 13 oz  ?Weight (kg) 61.8 kg 68 kg 67.5 kg  ?   ? ?Telemetry  ?  ?Atrial fibrillation with CVR  - Personally Reviewed ? ?ECG  ?  ?No new EKG to review - Personally Reviewed ? ?Physical Exam  ? ?GEN: Well nourished, well developed in no acute distress ?HEENT: Normal ?NECK: No JVD; No carotid bruits ?LYMPHATICS: No lymphadenopathy ?CARDIAC:RRR, no murmurs, rubs, gallops ?RESPIRATORY:  Clear to auscultation without rales, wheezing or rhonchi  ?ABDOMEN: Soft,  non-tender, non-distended ?MUSCULOSKELETAL:  No edema; No deformity  ?SKIN: Warm and dry ?NEUROLOGIC:  Alert and oriented x 3 ?PSYCHIATRIC:  Normal affect   ?Labs  ?  ?High Sensitivity Troponin:  No results for input(s): TROPONINIHS in the last 720 hours.   ?Chemistry ?Recent Labs  ?Lab 10/12/21 ?0418 10/13/21 ?7408 10/14/21 ?0126 10/15/21 ?0150 10/16/21 ?0200  ?NA 137 136 136 139 139  ?K 3.4* 3.7 4.0 3.3* 3.4*  ?CL 105 111 108 108 110  ?CO2 18* 15* 20* 21* 22  ?GLUCOSE 92 101* 106* 117* 93  ?BUN 34* 32* 33* 29* 22  ?CREATININE 1.56* 1.43* 1.37* 1.39* 1.28*  ?CALCIUM 8.0* 8.2* 8.0* 8.4* 8.4*  ?MG 2.4  --  1.8  --  1.8  ?PROT 5.2* 5.2* 5.0* 5.4*  --   ?ALBUMIN 2.4* 2.5* 2.4* 2.6*  --   ?AST 94* 62* 40 29  --   ?ALT 119* 111* 88* 71*  --   ?ALKPHOS 70 82 79 76  --   ?BILITOT 0.6 QUANTITY NOT SUFFICIENT, UNABLE TO PERFORM TEST 0.3 0.4  --   ?GFRNONAA 42* 47* 50*  49* 54*  ?ANIONGAP '14 10 8 10 7  '$ ? ?  ?Lipids No results for input(s): CHOL, TRIG, HDL, LABVLDL, LDLCALC, CHOLHDL in the last 168 hours.  ?Hematology ?Recent Labs  ?Lab 10/13/21 ?1200 10/14/21 ?0126 10/15/21 ?0150  ?WBC 11.8* 11.5* 7.8  ?RBC 3.57* 3.25* 3.48*  ?HGB 9.3* 8.2* 8.9*  ?HCT 28.5* 25.4* 27.8*  ?MCV 79.8* 78.2* 79.9*  ?MCH 26.1 25.2* 25.6*  ?MCHC 32.6 32.3 32.0  ?RDW 17.2* 17.2* 17.2*  ?PLT 169 187 193  ? ? ?Thyroid No results for input(s): TSH, FREET4 in the last 168 hours.  ?BNPNo results for input(s): BNP, PROBNP in the last 168 hours.  ?DDimer No results for input(s): DDIMER in the last 168 hours.  ? ?Radiology  ?  ?No results found. ? ?Cardiac Studies  ? ?Echo 10/10/21: ? 1. Left ventricular ejection fraction, by estimation, is 30%. The left  ?ventricle has moderately decreased function. The left ventricle  ?demonstrates global hypokinesis. There is moderate left ventricular  ?hypertrophy. Left ventricular diastolic parameters  ? are indeterminate.  ? 2. Right ventricular systolic function is moderately reduced. The right  ?ventricular size is  moderately enlarged. There is normal pulmonary artery  ?systolic pressure.  ? 3. Left atrial size was moderately dilated.  ? 4. Right atrial size was severely dilated.  ? 5. The mitral valve is normal in structure. No evidence of mitral valve  ?regurgitation. No evidence of mitral stenosis.  ? 6. The aortic valve is tricuspid. Aortic valve regurgitation is not  ?visualized. Aortic valve sclerosis is present, with no evidence of aortic  ?valve stenosis.  ? 7. The inferior vena cava is dilated in size with <50% respiratory  ?variability, suggesting right atrial pressure of 15 mmHg.  ? ?Patient Profile  ?   ?88 y.o. male with a history of CAD s/p stenting to RCA in 1997, paroxysmal atrial fibrillation not on anticoagulation, hypertension, hyperlipidemia, CKD stage III, chronic anemia, COPD, BPH, and recurrent C. Diff colitis who is being seen for the evaluation of CHF ? ?Assessment & Plan  ?  ?New Onset Systolic CHF: Patient presented with fever, diarrhea, and altered mental status and was admitted for sepsis secondary to recurrent C. Diff colitis. Echo this admission showed LVEF of 30% with global hypokinesis and moderate LVH.  RV moderately enlarged with moderately reduced systolic function but PASP normal.  ?-Received IV fluid resuscitation on admission and subsequently became hypervolemic.  Diuresed with IV Lasix 40 mg twice daily. ?-he put out 1.6L yesterday and is net neg 7.6L since admit ?-SCr 1.28 today and improved from 1.39 yesterday.  K+3.4 ?-changing to lasix '20mg'$  daily today ?-continue Carvedilol 3.'125mg'$  BID ?-No ACE/ARB/ARNI or MRA due to renal function. May be able to add ARB as outpatient if renal function returns to baseline and BP allows. ?- Continue to monitor daily weights, strict I/Os, and renal function. ?- Maintain K greater than 4, mag greater than 2 ?- Drop in EF may be secondary to sepsis. However, cannot rule out progressive CAD. Given advanced age, possible underlying dementia, and other  comorbidities, he is not a good cardiac cath candidate. Would continue to treat medically.  We will plan for repeat echo as outpatient  to reassess EF function after starting GDMT. ?  ?Paroxysmal Atrial Fibrillation: Presented in sinus tachycardia with frequent ectopy but now in rate controlled atrial fibrillation. CHA2DS2-VASc = 5 (CAD, CHF, HTN, age x2). Previously not felt to be a anticoagulation candidate due to high fall risk. Also  has chronic anemia with hemoglobin of 7.9 currently ?- HR is well controlled ?- continue Carvedilol 3.'125mg'$  BID ?  ?Non-Sustained VT ?Patient having frequent PVCs and short runs of NSVT, longest run being 13 beats. ?- Add coreg 3.125 mg BID ?- replete K+ to keep > 4 and Mag > 2 ?  ?CAD ?S/p remote stenting to RCA in 1997. ?- No chest pain.  ?- Previously on aspirin but appears this was stopped.   ?- continue ASA '81mg'$  daily ?- Continue statin. ?  ?Hypertension ?History of hypertension but hypotensive this admission in the setting of sepsis.  Required Levophed but this was able to be stopped on 10/10/2021. ?- Bp well controlled ?- Continue to monitor closely. ?  ?Acute on Chronic CKD Stage II ?Creatinine 1.32 on admission and peaked at 2.01 on 10/11/2021.  Baseline around 0.7 to 0.9. ?- improved today at 1.28 ?- Continue to avoid nephrotoxic agents.  ?- Continue to monitor closely. ?  ?Otherwise, per primary team: ?- Sepsis secondary to recurrent C. Diff colitis ?- Acute metabolic encephalopathy ?- Aspiration pneumonia ?- COPD ?- Chronic anemia  ? ?I have spent a total of 35 minutes with patient reviewing 2D echo , telemetry, EKGs, labs and examining patient as well as establishing an assessment and plan that was discussed with the patient.  > 50% of time was spent in direct patient care.    ? ?For questions or updates, please contact [place] ?Please consult [email or web address] for contact info under  ? ?  ?   ?Signed, ?Fransico Him, MD  ?10/16/2021, 7:59 AM   ? ?

## 2021-10-16 NOTE — Progress Notes (Signed)
PROGRESS NOTE    Leon Estes  CBJ:[id number] DOB: 01-01-1931 DOA: 10/09/2021 PCP: Venia Carbon, MD   Brief Narrative:  He presented to [facility] ED 2/27 with complaints of AMS, fever, and diarrhea x  24 hours. Upon arrival to the ED he was found to be febrile and was not entirely oriented. There was concern for sepsis. He was started on antibiotics and given volume. Stool testing was positive for c. Difficile antigen and toxin.  He was admitted to the hospitalist service for treatment of C. Dif colitis. However, prior to being moved from the ED he developed hypotension with MAPs into the 50s refractory to an additional liter bolus. He was started on norepinephrine and he was admitted under ICU.  Significant Hospital Events: Including procedures, antibiotic start and stop dates in addition to other pertinent events   2/27: Admitted to [facility]  2/28: Transferred to ICU for hypotension. Levophed initiated. CT of abdomen and pelvis reveal acute pancolitis most likely infectious. Small bowel enteritis also.  3/1 off norepi. 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 and right lower lobe pneumonia/aspiration pneumonia: Shock resolved as of 10/11/2021.  Off of Levophed.  He completed 5 days of Rocephin and Flagyl.  ID was consulted who discontinued Flagyl but he remains on high-dose of oral vancomycin.  He will benefit from stool transplant down the road once he is completely treated.  Lactic acidosis has resolved as well.  ID has formulated plan for slow tapering of the vancomycin that he will be discharged on.  Patient has had 6 bowel movements in last 24 hours.  We will keep in the hospital and will be discharged if he has 3 or less bowel movements in 24 hours as this gentleman is old and can easily get dehydrated.  Acute systolic CHF: New diagnosis of acute systolic CHF during this admission with a EF of 30%.  Patient appears euvolemic.  Cardiology is on board and plans to medically manage due to multiple medical problems and dementia.  He is now on oral Lasix.  History of COPD: Not in exacerbation.  Continue home medications/bronchodilators.  Acute septic/toxic encephalopathy: Secondary to infection and sepsis.  Resolved.  Now fully alert and oriented.  AKI: Baseline creatinine around 1.1.  Presented with 1.32, peaked at 2.01 on 10/11/2021.  Now almost back to baseline.  Metabolic acidosis: Resolved.  Hypokalemia: Low again, will replace.  Elevated LFTs: Likely due to hepatic congestion due to CHF.  Improving.  Bilirubin normal.  Anemia of chronic disease: Stable.  History of BPH: Continue Flomax.  Chronic peripheral neuropathy: Holding Neurontin for now.  Physical  deconditioning: PT OT consulted and they recommend SNF however patient's wife wants to take him home and she is not interested in SNF.  DVT prophylaxis: heparin injection 5,000 Units Start: 10/10/21 1400   Code Status: DNR  Family Communication:  None present at bedside.  Discussed with his wife over the  phone  Status is: Inpatient Remains inpatient appropriate because: Still needs inpatient medical management  Estimated body mass index is 21.34 kg/m as calculated from the following:   Height as of 09/28/21: '5\' 7"'$  (1.702 m).   Weight as of this encounter: 61.8 kg.    Nutritional Assessment: Body mass index is 21.34 kg/m.Marland Kitchen Seen by dietician.  I agree with the assessment and plan as outlined below: Nutrition Status:        . Skin Assessment: I have examined the patient's skin and I agree with the wound assessment as performed by the wound care RN as outlined below:    Consultants:  Cardiology ID Procedures:  None  Antimicrobials:  Anti-infectives (From admission, onward)    Start     Dose/Rate Route Frequency Ordered Stop   12/11/21 1000  vancomycin (VANCOCIN) capsule 125 mg       See Hyperspace for full Linked Orders Report.   125 mg Oral Every 3 DAYS 10/15/21 1246 01/10/22 0959   11/13/21 1000  vancomycin (VANCOCIN) capsule 125 mg       See Hyperspace for full Linked Orders Report.   125 mg Oral Every other day 10/15/21 1246 12/11/21 0959   11/06/21 1000  vancomycin (VANCOCIN) capsule 125 mg       See Hyperspace for full Linked Orders Report.   125 mg Oral Daily 10/15/21 1246 11/13/21 0959   10/30/21 0000  vancomycin (VANCOCIN) 125 MG capsule         Oral  10/16/21 0923 01/08/22 2359   10/29/21 2200  vancomycin (VANCOCIN) capsule 125 mg       See Hyperspace for full Linked Orders Report.   125 mg Oral 2 times daily 10/15/21 1246 11/05/21 2159   10/16/21 0000  vancomycin (VANCOCIN) 250 MG capsule        500 mg Oral 4 times daily 10/16/21 0921 10/30/21 2359   10/15/21 1400  vancomycin (VANCOCIN) capsule 500 mg       See Hyperspace for full Linked Orders Report.   500 mg Oral 4 times daily 10/15/21 1246 10/29/21 1359   10/11/21 1800  vancomycin (VANCOCIN) capsule 500 mg  Status:  Discontinued        500 mg Oral Every 6 hours 10/11/21 1434 10/15/21 1246   10/11/21  1400  vancomycin (VANCOCIN) 50 mg/mL oral solution SOLN 500 mg  Status:  Discontinued        500 mg Oral 4 times daily 10/11/21 1027 10/11/21 1211   10/11/21 1400  vancomycin (VANCOCIN) capsule 500 mg  Status:  Discontinued        500 mg Oral 4 times daily 10/11/21 1211 10/11/21 1434   10/11/21 1215  vancomycin (VANCOCIN) capsule 500 mg        500 mg Oral  Once 10/11/21 1117 10/11/21 1152   10/11/21 1000  vancomycin (VANCOCIN) 50 mg/mL oral solution SOLN 500 mg  Status:  Discontinued        500 mg Per Tube 4 times daily 10/11/21 0750 10/11/21 1027   10/10/21 2200  vancomycin (VANCOREADY) IVPB 750 mg/150 mL  Status:  Discontinued        750 mg 150 mL/hr  over 60 Minutes Intravenous Every 24 hours 10/09/21 2021 10/10/21 0110   10/10/21 1800  vancomycin (VANCOCIN) 500 mg in sodium chloride irrigation 0.9 % 100 mL ENEMA  Status:  Discontinued        500 mg Rectal Every 6 hours 10/10/21 1406 10/11/21 0750   10/10/21 1300  cefTRIAXone (ROCEPHIN) 2 g in sodium chloride 0.9 % 100 mL IVPB  Status:  Discontinued        2 g 200 mL/hr over 30 Minutes Intravenous Every 24 hours 10/10/21 1159 10/14/21 1012   10/10/21 1130  vancomycin (VANCOCIN) 500 mg in sodium chloride irrigation 0.9 % 100 mL ENEMA        500 mg Rectal  Once 10/10/21 1030 10/10/21 1123   10/10/21 1000  ceFEPIme (MAXIPIME) 2 g in sodium chloride 0.9 % 100 mL IVPB  Status:  Discontinued        2 g 200 mL/hr over 30 Minutes Intravenous Every 12 hours 10/09/21 2021 10/10/21 0110   10/10/21 1000  vancomycin (VANCOCIN) capsule 500 mg  Status:  Discontinued        500 mg Oral Every 6 hours 10/10/21 0908 10/10/21 1406   10/10/21 0600  vancomycin (VANCOCIN) 500 mg in sodium chloride irrigation 0.9 % 100 mL ENEMA  Status:  Discontinued        500 mg Rectal Every 6 hours 10/10/21 0329 10/10/21 0908   10/10/21 0400  metroNIDAZOLE (FLAGYL) IVPB 500 mg  Status:  Discontinued        500 mg 100 mL/hr over 60 Minutes Intravenous Every 8 hours 10/10/21  0328 10/14/21 1320   10/10/21 0115  fidaxomicin (DIFICID) tablet 200 mg  Status:  Discontinued        200 mg Oral 2 times daily 10/10/21 0110 10/10/21 0327   10/09/21 2000  ceFEPIme (MAXIPIME) 2 g in sodium chloride 0.9 % 100 mL IVPB        2 g 200 mL/hr over 30 Minutes Intravenous  Once 10/09/21 1948 10/09/21 2149   10/09/21 2000  metroNIDAZOLE (FLAGYL) IVPB 500 mg        500 mg 100 mL/hr over 60 Minutes Intravenous  Once 10/09/21 1948 10/09/21 2149   10/09/21 2000  vancomycin (VANCOCIN) IVPB 1000 mg/200 mL premix  Status:  Discontinued        1,000 mg 200 mL/hr over 60 Minutes Intravenous  Once 10/09/21 1948 10/09/21 1952   10/09/21 2000  vancomycin (VANCOREADY) IVPB 1250 mg/250 mL        1,250 mg 166.7 mL/hr over 90 Minutes Intravenous  Once 10/09/21 1952 10/09/21 2336         Subjective: Patient seen and examined.  He has no complaints.  He is eager to go home.  Objective: Vitals:   10/15/21 2127 10/16/21 0832 10/16/21 0907 10/16/21 1014  BP: 121/89 131/79  125/88  Pulse: 81 67  64  Resp: 18 18    Temp: 98 F (36.7 C) 97.8 F (36.6 C)    TempSrc: Oral Oral    SpO2: 100% 98% 98%   Weight:        Intake/Output Summary (Last 24 hours) at 10/16/2021 1323 Last data filed at 10/16/2021 0900 Gross per 24 hour  Intake 700 ml  Output 1100 ml  Net -400 ml    Filed Weights   10/12/21 2232 10/13/21 0239 10/15/21 2127  Weight: 67.5 kg 68 kg 61.8 kg    Examination:  General exam: Appears calm and comfortable  Respiratory system:  Clear to auscultation. Respiratory effort normal. Cardiovascular system: S1 & S2 heard, RRR. No JVD, murmurs, rubs, gallops or clicks. No pedal edema. Gastrointestinal system: Abdomen is nondistended, soft and nontender. No organomegaly or masses felt. Normal bowel sounds heard. Central nervous system: Alert and oriented. No focal neurological deficits. Extremities: Symmetric 5 x 5 power. Skin: No rashes, lesions or ulcers.  Psychiatry: Judgement  and insight appear normal. Mood & affect appropriate.   Data Reviewed: I have personally reviewed following labs and imaging studies  CBC: Recent Labs  Lab 10/11/21 0102 10/11/21 0641 10/12/21 0418 10/13/21 1200 10/14/21 0126 10/15/21 0150  WBC 11.1*  --  15.9* 11.8* 11.5* 7.8  NEUTROABS 9.3*  --  13.7* 9.8* 8.5* 4.5  HGB 8.5* 8.5* 7.9* 9.3* 8.2* 8.9*  HCT 25.7* 25.0* 24.2* 28.5* 25.4* 27.8*  MCV 78.8*  --  78.8* 79.8* 78.2* 79.9*  PLT 181  --  170 169 187 876    Basic Metabolic Panel: Recent Labs  Lab 10/09/21 1931 10/09/21 1949 10/10/21 0444 10/11/21 0102 10/12/21 0418 10/13/21 0506 10/14/21 0126 10/15/21 0150 10/16/21 0200  NA 134*   < >  --    < > 137 136 136 139 139  K 3.4*   < >  --    < > 3.4* 3.7 4.0 3.3* 3.4*  CL 105   < >  --    < > 105 111 108 108 110  CO2 20*   < >  --    < > 18* 15* 20* 21* 22  GLUCOSE 102*   < >  --    < > 92 101* 106* 117* 93  BUN 24*   < >  --    < > 34* 32* 33* 29* 22  CREATININE 1.32*   < >  --    < > 1.56* 1.43* 1.37* 1.39* 1.28*  CALCIUM 8.2*   < >  --    < > 8.0* 8.2* 8.0* 8.4* 8.4*  MG 1.5*  --  1.5*  --  2.4  --  1.8  --  1.8  PHOS  --   --  3.5  --   --   --   --   --   --    < > = values in this interval not displayed.    GFR: Estimated Creatinine Clearance: 34.9 mL/min (A) (by C-G formula based on SCr of 1.28 mg/dL (H)). Liver Function Tests: Recent Labs  Lab 10/11/21 0102 10/12/21 0418 10/13/21 0506 10/14/21 0126 10/15/21 0150  AST 77* 94* 62* 40 29  ALT 86* 119* 111* 88* 71*  ALKPHOS 61 70 82 79 76  BILITOT 0.8 0.6 QUANTITY NOT SUFFICIENT, UNABLE TO PERFORM TEST 0.3 0.4  PROT 5.0* 5.2* 5.2* 5.0* 5.4*  ALBUMIN 2.5* 2.4* 2.5* 2.4* 2.6*    No results for input(s): LIPASE, AMYLASE in the last 168 hours. Recent Labs  Lab 10/09/21 1931  AMMONIA 45*    Coagulation Profile: Recent Labs  Lab 10/09/21 1931  INR 1.1    Cardiac Enzymes: No results for input(s): CKTOTAL, CKMB, CKMBINDEX, TROPONINI in the  last 168 hours. BNP (last 3 results) No results for input(s): PROBNP in the last 8760 hours. HbA1C: No results for input(s): HGBA1C in the last 72 hours. CBG: Recent Labs  Lab 10/12/21 0807 10/12/21 0835 10/12/21 1218 10/12/21 1530 10/12/21 1952  GLUCAP 64* 97 108* 95 164*    Lipid Profile: No results for input(s): CHOL, HDL, LDLCALC, TRIG, CHOLHDL,  LDLDIRECT in the last 72 hours. Thyroid Function Tests: No results for input(s): TSH, T4TOTAL, FREET4, T3FREE, THYROIDAB in the last 72 hours. Anemia Panel: No results for input(s): VITAMINB12, FOLATE, FERRITIN, TIBC, IRON, RETICCTPCT in the last 72 hours. Sepsis Labs: Recent Labs  Lab 10/13/21 1200 10/14/21 0938 10/14/21 1231 10/14/21 1612  LATICACIDVEN 3.5* 1.3 1.2 1.2     Recent Results (from the past 240 hour(s))  C Difficile Quick Screen w PCR reflex     Status: Abnormal   Collection Time: 10/09/21  7:09 PM   Specimen: Stool  Result Value Ref Range Status   C Diff antigen POSITIVE (A) NEGATIVE Final   C Diff toxin POSITIVE (A) NEGATIVE Final   C Diff interpretation Toxin producing C. difficile detected.  Final    Comment: RESULT CALLED TO, READ BACK BY AND VERIFIED WITH: RN PAYTON BLANCHARD 10/09/21'@23'$ :20 BY TW Performed at [facility], [place] [address], [place], [place] [address]   Blood culture (routine x 2)     Status: None   Collection Time: 10/09/21  7:31 PM   Specimen: BLOOD  Result Value Ref Range Status   Specimen Description BLOOD BLOOD RIGHT WRIST  Final   Special Requests   Final    BOTTLES DRAWN AEROBIC AND ANAEROBIC Blood Culture adequate volume   Culture   Final    NO GROWTH 5 DAYS Performed at [facility], [place] [address], [place], [place] [address]    Report Status 10/14/2021 FINAL  Final  MRSA Next Gen by PCR, Nasal     Status: Abnormal   Collection Time: 10/10/21  5:17 AM   Specimen: Nasal Mucosa; Nasal Swab  Result Value Ref Range Status   MRSA by PCR Next Gen DETECTED (A) NOT  DETECTED Final    Comment: RESULT CALLED TO, READ BACK BY AND VERIFIED WITH: F. Pricilla Loveless AT 5176 10/10/21 D. VANHOOK (NOTE) The GeneXpert MRSA Assay (FDA approved for NASAL specimens only), is one component of a comprehensive MRSA colonization surveillance program. It is not intended to diagnose MRSA infection nor to guide or monitor treatment for MRSA infections. Test performance is not FDA approved in patients less than 31 years old. Performed at [facility], [place] [address] [place], [place] [address]       Radiology Studies: No results found.  Scheduled Meds:  aspirin EC  81 mg Oral Daily   carvedilol  3.125 mg Oral BID WC   fluticasone furoate-vilanterol  1 puff Inhalation Daily   furosemide  20 mg Oral Daily   heparin  5,000 Units Subcutaneous Q8H   mouth rinse  15 mL Mouth Rinse BID   QUEtiapine  25 mg Oral QHS   tamsulosin  0.4 mg Oral Daily   vancomycin  500 mg Oral QID   Followed by   Derrill Memo ON 10/29/2021] vancomycin  125 mg Oral BID   Followed by   Derrill Memo ON 11/06/2021] vancomycin  125 mg Oral Daily   Followed by   Derrill Memo ON 11/13/2021] vancomycin  125 mg Oral QODAY   Followed by   Derrill Memo ON 12/11/2021] vancomycin  125 mg Oral Q3 days   Continuous Infusions:     LOS: 7 days   Time spent: 24 minutes  Darliss Cheney, MD [facility]  10/16/2021, 1:23 PM  Please page via [place] [place] and do not message via secure chat for urgent patient care matters. Secure chat can be used for non urgent patient care matters.  How to  contact the [facility] Attending or Consulting provider [place] or covering provider during after hours [place], for this patient?  Check the care team in [facility] and look for a) attending/consulting [facility] provider listed and b) the [facility] team listed. Page or secure chat [phone]. Log into [email or web address] and use [place]'s universal password to access. If you do not have the password, please contact the hospital operator. Locate the [facility] provider you are looking for  under [facility] and page to a number that you can be directly reached. If you still have difficulty reaching the provider, please page the [facility] (Director on Call) for the Hospitalists listed on amion for assistance.

## 2021-10-17 ENCOUNTER — Other Ambulatory Visit (HOSPITAL_COMMUNITY): Payer: Self-pay

## 2021-10-17 LAB — BASIC METABOLIC PANEL
Anion gap: 10 (ref 5–15)
BUN: 19 mg/dL (ref 8–23)
CO2: 22 mmol/L (ref 22–32)
Calcium: 8.6 mg/dL — ABNORMAL LOW (ref 8.9–10.3)
Chloride: 107 mmol/L (ref 98–111)
Creatinine, Ser: 1.18 mg/dL (ref 0.61–1.24)
GFR, Estimated: 59 mL/min — ABNORMAL LOW (ref >60)
Glucose, Bld: 114 mg/dL — ABNORMAL HIGH (ref 70–99)
Potassium: 3.8 mmol/L (ref 3.5–5.1)
Sodium: 139 mmol/L (ref 135–145)

## 2021-10-17 LAB — MAGNESIUM: Magnesium: 1.6 mg/dL — ABNORMAL LOW (ref 1.7–2.4)

## 2021-10-17 MED ORDER — LOSARTAN POTASSIUM 25 MG PO TABS
12.5000 mg | ORAL_TABLET | Freq: Every day | ORAL | Status: DC
  Administered 2021-10-17: 12.5 mg via ORAL
  Filled 2021-10-17: qty 1

## 2021-10-17 MED ORDER — LOSARTAN POTASSIUM 25 MG PO TABS
12.5000 mg | ORAL_TABLET | Freq: Every day | ORAL | 0 refills | Status: DC
  Filled 2021-10-17: qty 15, 30d supply, fill #0

## 2021-10-17 MED ORDER — ASPIRIN 81 MG PO TBEC
81.0000 mg | DELAYED_RELEASE_TABLET | Freq: Every day | ORAL | 0 refills | Status: AC
  Filled 2021-10-17: qty 30, 30d supply, fill #0

## 2021-10-17 MED ORDER — CARVEDILOL 3.125 MG PO TABS
3.1250 mg | ORAL_TABLET | Freq: Two times a day (BID) | ORAL | 0 refills | Status: DC
  Filled 2021-10-17: qty 60, 30d supply, fill #0

## 2021-10-17 MED ORDER — FUROSEMIDE 20 MG PO TABS
20.0000 mg | ORAL_TABLET | Freq: Every day | ORAL | 0 refills | Status: DC
  Filled 2021-10-17: qty 30, 30d supply, fill #0

## 2021-10-17 NOTE — Progress Notes (Signed)
Physical Therapy Treatment ?Patient Details ?Name: Leon Estes ?MRN: [id number] ?DOB: 1931/03/04 ?Today's Date: 10/17/2021 ? ? ?History of Present Illness 88 y/o male presented to ED on 2/27 for vomiting and diarrhea along with AMS x 1 day. Diagnosed with sepsis secondary to recurrent C diff. Required ICU admission for hypotension. CT abdomen/pelvis revelaed acute pancolitis most likely infectious. PMH: BPH; stage 3 CKD; COPD; CAD; HTN; recent diagnosis of Schwannoma; and recurrent C diff coliitis ? ?  ?PT Comments  ? ? Continuing work on functional mobility and activity tolerance;  Session focused on amb (he promised he would walk today when he politely declined PT yesterday afternoon); Not as far as last PT session (opted shorter distance to keep from exhausting pt on discharge day); Pt's wife present and voiced confidence in their ability to manage at home   ?Recommendations for follow up therapy are one component of a multi-disciplinary discharge planning process, led by the attending physician.  Recommendations may be updated based on patient status, additional functional criteria and insurance authorization. ? ?Follow Up Recommendations ? Home health PT ?  ?  ?Assistance Recommended at Discharge Frequent or constant Supervision/Assistance  ?Patient can return home with the following A little help with walking and/or transfers;A little help with bathing/dressing/bathroom;Assistance with cooking/housework;Direct supervision/assist for medications management;Direct supervision/assist for financial management;Assist for transportation;Help with stairs or ramp for entrance ?  ?Equipment Recommendations ? None recommended by PT  ?  ?Recommendations for Other Services   ? ? ?  ?Precautions / Restrictions Precautions ?Precautions: Fall  ?  ? ?Mobility ? Bed Mobility ?  ?  ?  ?  ?  ?  ?  ?General bed mobility comments: EOB upon arrival ?  ? ?Transfers ?Overall transfer level: Needs assistance ?Equipment used: Rolling  walker (2 wheels) ?Transfers: Sit to/from Stand ?Sit to Stand: Min assist ?  ?  ?  ?  ?  ?General transfer comment: minA to rise and steady ?  ? ?Ambulation/Gait ?Ambulation/Gait assistance: Min guard ?Gait Distance (Feet): 40 Feet ?Assistive device: Rolling walker (2 wheels) ?Gait Pattern/deviations: Step-through pattern, Decreased stride length, Trunk flexed ?Gait velocity: decreased ?  ?  ?General Gait Details: min guard for safety, no overt LOB noted but reliant on use of RW ? ? ?Stairs ?  ?  ?  ?  ?  ? ? ?Wheelchair Mobility ?  ? ?Modified Rankin (Stroke Patients Only) ?  ? ? ?  ?Balance   ?  ?Sitting balance-Leahy Scale: Good ?  ?  ?Standing balance support: Bilateral upper extremity supported, Reliant on assistive device for balance ?Standing balance-Leahy Scale: Poor ?Standing balance comment: reliant on UE support of RW ?  ?  ?  ?  ?  ?  ?  ?  ?  ?  ?  ?  ? ?  ?Cognition Arousal/Alertness: Awake/alert ?Behavior During Therapy: [facility] for tasks assessed/performed ?Overall Cognitive Status: Within Functional Limits for tasks assessed ?  ?  ?  ?  ?  ?  ?  ?  ?  ?  ?  ?  ?  ?  ?  ?  ?  ?  ?  ? ?  ?Exercises   ? ?  ?General Comments General comments (skin integrity, edema, etc.): Dressed and ready for discharge; assisted to sit in transport chair ?  ?  ? ?Pertinent Vitals/Pain Pain Assessment ?Pain Assessment: No/denies pain  ? ? ?Home Living   ?  ?  ?  ?  ?  ?  ?  ?  ?  ?   ?  ?  Prior Function    ?  ?  ?   ? ?PT Goals (current goals can now be found in the care plan section) Acute Rehab PT Goals ?Patient Stated Goal: to get stronger and get better ?PT Goal Formulation: With patient/family ?Time For Goal Achievement: 10/27/21 ?Potential to Achieve Goals: Good ?Progress towards PT goals: Progressing toward goals ? ?  ?Frequency ? ? ? Min 3X/week ? ? ? ?  ?PT Plan Current plan remains appropriate  ? ? ?Co-evaluation   ?  ?  ?  ?  ? ?  ?AM-PAC PT "6 Clicks" Mobility   ?Outcome Measure ? Help needed turning from your  back to your side while in a flat bed without using bedrails?: A Little ?Help needed moving from lying on your back to sitting on the side of a flat bed without using bedrails?: A Little ?Help needed moving to and from a bed to a chair (including a wheelchair)?: A Little ?Help needed standing up from a chair using your arms (e.g., wheelchair or bedside chair)?: A Little ?Help needed to walk in hospital room?: A Little ?Help needed climbing 3-5 steps with a railing? : A Little ?6 Click Score: 18 ? ?  ?End of Session   ?Activity Tolerance: Patient tolerated treatment well ?Patient left: in chair;with family/visitor present (Ready to discharge) ?Nurse Communication: Mobility status ?PT Visit Diagnosis: Unsteadiness on feet (R26.81);Muscle weakness (generalized) (M62.81);Other abnormalities of gait and mobility (R26.89) ?  ? ? ?Time: 2725-3664 ?PT Time Calculation (min) (ACUTE ONLY): 8 min ? ?Charges:  $Gait Training: 8-22 mins          ?          ? ?Roney Marion, PT  ?Acute Rehabilitation Services ?Pager [phone] ?Office [phone] ? ? ? ?[place] ?10/17/2021, 11:57 AM ? ?

## 2021-10-17 NOTE — Discharge Instructions (Signed)
Low Sodium Nutrition Therapy  Eating less sodium can help you if you have high blood pressure, heart failure, or kidney or liver disease.   Your body needs a little sodium, but too much sodium can cause your body to hold onto extra water. This extra water will raise your blood pressure and can cause damage to your heart, kidneys, or liver as they are forced to work harder.   Sometimes you can see how the extra fluid affects you because your hands, legs, or belly swell. You may also hold water around your heart and lungs, which makes it hard to breathe.   Even if you take medication for blood pressure or a water pill (diuretic) to remove fluid, it is still important to have less salt in your diet.   Check with your primary care provider before drinking alcohol since it may affect the amount of fluid in your body and how your heart, kidneys, or liver work. Sodium in Food A low-sodium meal plan limits the sodium that you get from food and beverages to 1,500-2,000 milligrams (mg) per day. Salt is the main source of sodium. Read the nutrition label on the package to find out how much sodium is in one serving of a food.  Select foods with 140 milligrams (mg) of sodium or less per serving.  You may be able to eat one or two servings of foods with a little more than 140 milligrams (mg) of sodium if you are closely watching how much sodium you eat in a day.  Check the serving size on the label. The amount of sodium listed on the label shows the amount in one serving of the food. So, if you eat more than one serving, you will get more sodium than the amount listed.  Tips Cutting Back on Sodium Eat more fresh foods.  Fresh fruits and vegetables are low in sodium, as well as frozen vegetables and fruits that have no added juices or sauces.  Fresh meats are lower in sodium than processed meats, such as bacon, sausage, and hotdogs.  Not all processed foods are unhealthy, but some processed foods may have too  much sodium.  Eat less salt at the table and when cooking. One of the ingredients in salt is sodium.  One teaspoon of table salt has 2,300 milligrams of sodium.  Leave the salt out of recipes for pasta, casseroles, and soups. Be a Paramedic.  Food packages that say Salt-free, sodium-free, very low sodium, and low sodium have less than 140 milligrams of sodium per serving.  Beware of products identified as Unsalted, No Salt Added, Reduced Sodium, or Lower Sodium. These items may still be high in sodium. You should always check the nutrition label. Add flavors to your food without adding sodium.  Try lemon juice, lime juice, or vinegar.  Dry or fresh herbs add flavor.  Buy a sodium-free seasoning blend or make your own at home. You can purchase salt-free or sodium-free condiments like barbeque sauce in stores and online. Ask your registered dietitian nutritionist for recommendations and where to find them.   Eating in Restaurants Choose foods carefully when you eat outside your home. Restaurant foods can be very high in sodium. Many restaurants provide nutrition facts on their menus or their websites. If you cannot find that information, ask your server. Let your server know that you want your food to be cooked without salt and that you would like your salad dressing and sauces to be served on the  side.    Foods Recommended Food Group Foods Recommended  Grains Bread, bagels, rolls without salted tops Homemade bread made with reduced-sodium baking powder Cold cereals, especially shredded wheat and puffed rice Oats, grits, or cream of wheat Pastas, quinoa, and rice Popcorn, pretzels or crackers without salt Corn tortillas  Protein Foods Fresh meats and fish; [place] bacon (check the nutrition labels - make sure they are not packaged in a sodium solution) Canned or packed tuna (no more than 4 ounces at 1 serving) Beans and peas Soybeans) and tofu Eggs Nuts or nut butters  without salt  Dairy Milk or milk powder Plant milks, such as rice and soy Yogurt, including Greek yogurt Small amounts of natural cheese (blocks of cheese) or reduced-sodium cheese can be used in moderation. (Swiss, ricotta, and fresh mozzarella cheese are lower in sodium than the others) Cream Cheese Low sodium cottage cheese  Vegetables Fresh and frozen vegetables without added sauces or salt Homemade soups (without salt) Low-sodium, salt-free or sodium-free canned vegetables and soups  Fruit Fresh and canned fruits Dried fruits, such as raisins, cranberries, and prunes  Oils Tub or liquid margarine, regular or without salt Canola, corn, peanut, olive, safflower, or sunflower oils  Condiments Fresh or dried herbs such as basil, bay leaf, dill, mustard (dry), nutmeg, paprika, parsley, rosemary, sage, or thyme.  Low sodium ketchup Vinegar  Lemon or lime juice Pepper, red pepper flakes, and cayenne. Hot sauce contains sodium, but if you use just a drop or two, it will not add up to much.  Salt-free or sodium-free seasoning mixes and marinades Simple salad dressings: vinegar and oil   Foods Not Recommended Food Group Foods Not Recommended  Grains Breads or crackers topped with salt Cereals (hot/cold) with more than 300 mg sodium per serving Biscuits, cornbread, and other quick breads prepared with baking soda Pre-packaged bread crumbs Seasoned and packaged rice and pasta mixes Self-rising flours  Protein Foods Cured meats: Bacon, ham, sausage, pepperoni and hot dogs Canned meats (chili, vienna sausage, or sardines) Smoked fish and meats Frozen meals that have more than 600 mg of sodium per serving Egg substitute (with added sodium)  Dairy Buttermilk Processed cheese spreads Cottage cheese (1 cup may have over 500 mg of sodium; look for low-sodium.) American or feta cheese Shredded Cheese has more sodium than blocks of cheese String cheese  Vegetables Canned vegetables  (unless they are salt-free, sodium-free or low sodium) Frozen vegetables with seasoning and sauces Sauerkraut and pickled vegetables Canned or dried soups (unless they are salt-free, sodium-free, or low sodium) [place] fries and onion rings  Fruit Dried fruits preserved with additives that have sodium  Oils Salted butter or margarine, all types of olives  Condiments Salt, sea salt, kosher salt, onion salt, and garlic salt Seasoning mixes with salt Bouillon cubes Ketchup Barbeque sauce and Worcestershire sauce unless low sodium Soy sauce Salsa, pickles, olives, relish Salad dressings: ranch, blue cheese, [place], and [place].   Low Sodium Sample 1-Day Menu  Breakfast 1 cup cooked oatmeal  1 slice whole wheat bread toast  1 tablespoon peanut butter without salt  1 banana  1 cup 1% milk  Lunch Tacos made with: 2 corn tortillas   cup black beans, low sodium   cup roasted or grilled chicken (without skin)   avocado  Squeeze of lime juice  1 cup salad greens  1 tablespoon low-sodium salad dressing   cup strawberries  1 orange  Afternoon Snack 1/3 cup grapes  6 ounces yogurt  Evening Meal 3 ounces herb-baked fish  1 baked potato  2 teaspoons olive oil   cup cooked carrots  2 thick slices tomatoes on:  2 lettuce leaves  1 teaspoon olive oil  1 teaspoon balsamic vinegar  1 cup 1% milk  Evening Snack 1 apple   cup almonds without salt   Low-Sodium Vegetarian (Lacto-Ovo) Sample 1-Day Menu  Breakfast 1 cup cooked oatmeal  1 slice whole wheat toast  1 tablespoon peanut butter without salt  1 banana  1 cup 1% milk  Lunch Tacos made with: 2 corn tortillas   cup black beans, low sodium   cup roasted or grilled chicken (without skin)   avocado  Squeeze of lime juice  1 cup salad greens  1 tablespoon low-sodium salad dressing   cup strawberries  1 orange  Evening Meal Stir fry made with:  cup tofu  1 cup brown rice   cup broccoli   cup green beans   cup  peppers   tablespoon peanut oil  1 orange  1 cup 1% milk  Evening Snack 4 strips celery  2 tablespoons hummus  1 hard-boiled egg   Low-Sodium Vegan Sample 1-Day Menu  Breakfast 1 cup cooked oatmeal  1 tablespoon peanut butter without salt  1 cup blueberries  1 cup soymilk fortified with calcium, vitamin B12, and vitamin D  Lunch 1 small whole wheat pita   cup cooked lentils  2 tablespoons hummus  4 carrot sticks  1 medium apple  1 cup soymilk fortified with calcium, vitamin B12, and vitamin D  Evening Meal Stir fry made with:  cup tofu  1 cup brown rice   cup broccoli   cup green beans   cup peppers   tablespoon peanut oil  1 cup cantaloupe  Evening Snack 1 cup soy yogurt   cup mixed nuts  Copyright 2020  Academy of [employer] Diet[employer]. All rights reserved  Sodium Free Flavoring Tips  When cooking, the following items may be used for flavoring instead of salt or seasonings that contain sodium. Remember: A little bit of spice goes a long way! Be careful not to overseason. Spice Blend Recipe (makes about ? cup) 5 teaspoons onion powder  2 teaspoons garlic powder  2 teaspoons paprika  2 teaspoon dry mustard  1 teaspoon crushed thyme leaves   teaspoon white pepper   teaspoon celery seed Food Item Flavorings  Beef Basil, bay leaf, caraway, curry, dill, dry mustard, garlic, grape jelly, green pepper, mace, marjoram, mushrooms (fresh), nutmeg, onion or onion powder, parsley, pepper, rosemary, sage  Chicken Basil, cloves, cranberries, mace, mushrooms (fresh), nutmeg, oregano, paprika, parsley, pineapple, saffron, sage, savory, tarragon, thyme, tomato, turmeric  Egg Chervil, curry, dill, dry mustard, garlic or garlic powder, green pepper, jelly, mushrooms (fresh), nutmeg, onion powder, paprika, parsley, rosemary, tarragon, tomato  Fish Basil, bay leaf, chervil, curry, dill, dry mustard, green pepper, lemon juice, marjoram, mushrooms (fresh), paprika, pepper,  tarragon, tomato, turmeric  Lamb Cloves, curry, dill, garlic or garlic powder, mace, mint, mint jelly, onion, oregano, parsley, pineapple, rosemary, tarragon, thyme  Pork Applesauce, basil, caraway, chives, cloves, garlic or garlic powder, onion or onion powder, rosemary, thyme  Veal Apricots, basil, bay leaf, currant jelly, curry, ginger, marjoram, mushrooms (fresh), oregano, paprika  Vegetables Basil, dill, garlic or garlic powder, ginger, lemon juice, mace, marjoram, nutmeg, onion or onion powder, tarragon, tomato, sugar or sugar substitute, salt-free salad dressing, vinegar  Desserts Allspice, anise, cinnamon, cloves, ginger, mace, nutmeg, vanilla extract, other  extracts   Copyright 2020  Academy of [employer] and Dietetics. All rights reserved  Fluid Restricted Nutrition Therapy  You have been prescribed this diet because your condition affects how much fluid you can eat or drink. If your heart, liver, or kidneys aren't working properly, you may not be able to effectively eliminate fluids from the body and this may cause swelling (edema) in the legs, arms, and/or stomach. Drink no more than _________ liters or ________ ounces or ________cups of fluid per day.  You don't need to stop eating or drinking the same fluids you normally would, but you may need to eat or drink less than usual.  Your registered dietitian nutritionist will help you determine the correct amount of fluid to consume during the day Breakfast Include fluids taken with medications  Lunch Include fluids taken with medications  Dinner Include fluids taken with medications  Bedtime Snack Include fluids taken with medications     Tips What Are Fluids?  A fluid is anything that is liquid or anything that would melt if left at room temperature. You will need to count these foods and liquids--including any liquid used to take medication--as part of your daily fluid intake. Some examples are: Alcohol (drink only with your  doctor's permission)  Coffee, tea, and other hot beverages  Gelatin (Jell-O)  Gravy  Ice cream, sherbet, sorbet  Ice cubes, ice chips  Milk, liquid creamer  Nutritional supplements  Popsicles  Vegetable and fruit juices; fluid in canned fruit  Watermelon  Yogurt  Soft drinks, lemonade, limeade  Soups  Syrup How Do I Measure My Fluid Intake? Record your fluid intake daily.  Tip: Every day, each time you eat or drink fluids, pour water in the same amount into an empty container that can hold the same amount of fluids you are allowed daily. This may help you keep track of how much fluid you are taking in throughout the day.  To accurately keep track of how much liquid you take in, measure the size of the cups, glasses, and bowls you use. If you eat soup, measure how much of it is liquid and how much is solid (such as noodles, vegetables, meat). Conversions for Measuring Fluid Intake  Milliliters (mL) Liters (L) Ounces (oz) Cups (c)  1000 1 32 4  1200 1.2 40 5  1500 1.5 50 6 1/4  1800 1.8 60 7 1/2  2000 2 67 8 1/3  Tips to Reduce Your Thirst Chew gum or suck on hard candy.  Rinse or gargle with mouthwash. Do not swallow.  Ice chips or popsicles my help quench thirst, but this too needs to be calculated into the total restriction. Melt ice chips or cubes first to figure out how much fluid they produce (for example, experiment with melting  cup ice chips or 2 ice cubes).  Add a lemon wedge to your water.  Limit how much salt you take in. A high salt intake might make you thirstier.  Don't eat or drink all your allowed liquids at once. Space your liquids out through the day.  Use small glasses and cups and sip slowly. If allowed, take your medications with fluids you eat or drink during a meal.   Fluid-Restricted Nutrition Therapy Sample 1-Day Menu  Breakfast 1 slice wheat toast  1 tablespoon peanut butter  1/2 cup yogurt (120 milliliters)  1/2 cup blueberries  1 cup milk (240  milliliters)   Lunch 3 ounces sliced [place]  2 slices whole wheat  bread  1/2 cup lettuce for sandwich  2 slices tomato for sandwich  1 ounce reduced-fat, reduced-sodium cheese  1/2 cup fresh carrot sticks  1 banana  1 cup unsweetened tea (240 milliliters)   Evening Meal 8 ounces soup (240 milliliters)  3 ounces salmon  1/2 cup quinoa  1 cup green beans  1 cup mixed greens salad  1 tablespoon olive oil  1 cup coffee (240 milliliters)  Evening Snack 1/2 cup sliced peaches  1/2 cup frozen yogurt (120 milliliters)  1 cup water (240 milliliters)  Copyright 2020  Academy of [employer] and Dietetics. All rights reserved

## 2021-10-17 NOTE — Discharge Summary (Signed)
PatientPhysician Discharge Summary  Leon Estes NAT:[id number] DOB: [birth year] DOA: 10/09/2021  PCP: Venia Carbon, MD  Admit date: 10/09/2021 Discharge date: 10/17/2021 30 Day Unplanned Readmission Risk Score    Flowsheet Row ED to Hosp-Admission (Current) from 10/09/2021 in [facility]  30 Day Unplanned Readmission Risk Score (%) 25.58 Filed at 10/17/2021 0801       This score is the patient's risk of an unplanned readmission within 30 days of being discharged (0 -100%). The score is based on dignosis, age, lab data, medications, orders, and past utilization.   Low:  0-14.9   Medium: 15-21.9   High: 22-29.9   Extreme: 30 and above          Admitted From: Home Disposition: Home  Recommendations for Outpatient Follow-up:  Follow up with PCP in 1-2 weeks Please obtain BMP/CBC in one week Follow-up with cardiology in 2 to 4 weeks Follow-up with ID/ has an appointment on October 30, 2021 at 3:45 PM with Dr. Tommy Medal Please follow up with your PCP on the following pending results: Unresulted Labs (From admission, onward)     Start     Ordered   10/11/21 0500  CBC with Differential/Platelet  Daily,   R     Question:  Specimen collection method  Answer:  Lab=Lab collect   10/10/21 1213              Home Health: Yes Equipment/Devices: None  Discharge Condition: Stable CODE STATUS: DNR Diet recommendation: Cardiac/low-sodium  Subjective: Seen and examined.  He has no complaints.  He is glad that he is going home today.  Brief/Interim Summary: He presented to [facility] ED 2/27 with complaints of AMS, fever, and diarrhea x  24 hours. Upon arrival to the ED he was found to be febrile and was not entirely oriented. There was concern for sepsis. He was started on antibiotics and given volume. Stool testing was positive for c. Difficile antigen and toxin.  He was admitted to the hospitalist service for treatment of C. Dif colitis. However, prior to being  moved from the ED he developed hypotension with MAPs into the 50s refractory to an additional liter bolus. He was started on norepinephrine and he was admitted under ICU.   Significant Hospital Events: Including procedures, antibiotic start and stop dates in addition to other pertinent events   2/27: Admitted to [facility]  2/28: Transferred to ICU for hypotension. Levophed initiated. CT of abdomen and pelvis reveal acute pancolitis most likely infectious. Small bowel enteritis also.  3/1 off norepi. But creatinine rising 3/3.  He was transferred to the floor and his care was assumed by hospitalist again.  Details of the hospitalization as below.    Septic shock secondary to [name] and right lower lobe pneumonia/aspiration pneumonia: Shock resolved as of 10/11/2021.  Off of Levophed.  He completed 5 days of Rocephin and Flagyl.  ID was consulted who discontinued Flagyl but he remains on high-dose of oral vancomycin.  He will benefit from stool transplant down the road once he is completely treated.  Lactic acidosis has resolved as well.  ID has formulated plan for slow tapering of the vancomycin that he will be discharged on.  Patient has had 3 bowel movements in last 24 hours.  He is cleared by ID for discharge.  He was assessed by PT OT who recommended SNF.  I talked to the patient and his wife and highly recommended discharging to SNF but  they both are adamant that he will not go to SNF but instead wants to go home.  Based on his and his wife's strong request, he is being discharged home with home health.  The wife told me that they have 2 other kids who can be available 24 hours if needed.   Acute systolic CHF: New diagnosis of acute systolic CHF during this admission with a EF of 30%.  Patient appears euvolemic.  Cardiology was on board and was managing this.  Due to his age and multiple comorbidities, they decided to pursue medical management and no cardiac cath.  They have added several new  medications including aspirin, Coreg, Lasix as well as losartan which she will be discharged on.   History of COPD: Not in exacerbation.  Continue home medications/bronchodilators.   Acute septic/toxic encephalopathy: Secondary to infection and sepsis.  Resolved.  Now fully alert and oriented.   AKI: Baseline creatinine around 1.1.  Presented with 1.32, peaked at 2.01 on 10/11/2021.  Now back to baseline.   Metabolic acidosis: Resolved.   Hypokalemia: Resolved.  Hypomagnesemia: We will replace today before discharge.   Elevated LFTs: Likely due to hepatic congestion due to CHF.  Improving.  Bilirubin normal.   Anemia of chronic disease: Stable.   History of BPH: Continue Flomax.   Discharge plan was discussed with patient and/or family member and they verbalized understanding and agreed with it.  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)   GERD (gastroesophageal reflux disease)   Acute renal failure superimposed on stage 3b chronic kidney disease (HCC)   Acute metabolic encephalopathy   Hypomagnesemia   Prolonged QT interval   Metabolic acidosis, increased anion gap (IAG)   Abnormal LFTs   Systolic heart failure (HCC)   PAF (paroxysmal atrial fibrillation) (HCC)   Acute combined systolic and diastolic heart failure (HCC)   NSVT (nonsustained ventricular tachycardia)    Discharge Instructions   Allergies as of 10/17/2021       Reactions   Benzalkonium Chloride    "Benzalkonium chloride is a quaternary ammonium antiseptic and disinfectant with actions and uses similar to those of other cationic surfactants. It is also used as an antimicrobial preservative for pharmaceutical products."    Augmentin [amoxicillin-pot Clavulanate] Rash   Bacitracin-polymyxin B Swelling, Rash   Neosporin [neomycin-bacitracin Zn-polymyx] Rash   Sulfamethoxazole-trimethoprim Rash   Terbinafine And Related Rash        Medication List     STOP taking these medications    omeprazole 20 MG capsule Commonly known as: PRILOSEC       TAKE these medications    acetaminophen 500 MG tablet Commonly known as: TYLENOL Take 1,000 mg by mouth every 6 (six) hours as needed for moderate pain or headache.   aspirin 81 MG EC tablet Take 1 tablet (81 mg total) by mouth daily. Swallow whole. Start taking on: October 18, 2021   budesonide-formoterol 160-4.5 MCG/ACT inhaler Commonly known as: SYMBICORT INHALE 2 PUFFS INTO THE LUNGS TWICE DAILY What changed:  how to take this when to take this   carvedilol 3.125 MG tablet Commonly known as: COREG Take 1 tablet (3.125 mg total) by mouth 2 (two) times daily with a meal.   Fish Oil 1000  MG Caps Take 1,000 mg by mouth daily.   furosemide 20 MG tablet Commonly known as: LASIX Take 1 tablet (20 mg total) by mouth daily. Start taking on: October 18, 2021   gabapentin 400 MG capsule Commonly known as: Neurontin Take 1 capsule (400 mg total) by mouth at bedtime.   losartan 25 MG tablet Commonly known as: COZAAR Take 0.5 tablets (12.5 mg total) by mouth daily. Start taking on: October 18, 2021   multivitamin with minerals Tabs tablet Take 1 tablet by mouth daily.   potassium chloride 10 MEQ tablet Commonly known as: KLOR-CON Take 1 tablet (10 mEq total) by mouth daily.   PROBIOTIC DAILY PO Take 1 capsule by mouth daily.   QUEtiapine 25 MG tablet Commonly known as: SEROQUEL Take 1 tablet (25 mg total) by mouth at bedtime.   simvastatin 40 MG tablet Commonly known as: ZOCOR Take 40 mg by mouth at bedtime.   tamsulosin 0.4 MG Caps capsule Commonly known as: FLOMAX TAKE 1 CAPSULE BY MOUTH ONCE DAILY What changed:  how to take this when to  take this   traMADol 50 MG tablet Commonly known as: ULTRAM Take 1 tablet (50 mg total) by mouth daily as needed. TAKE 1 TABLET BY MOUTH ONCE A DAY AS NEEDED FOR PAIN What changed:  reasons to take this additional instructions   triamcinolone cream 0.5 % Commonly known as: KENALOG Apply 1 application topically daily as needed (rash).   vancomycin 250 MG capsule Commonly known as: VANCOCIN Take 2 capsules (500 mg total) by mouth 4 (four) times daily for 13 days. What changed:  medication strength how much to take additional instructions   vancomycin 125 MG capsule Commonly known as: VANCOCIN Take 1 capsule (125 mg total) by mouth 2 (two) times daily for 7 days, THEN 1 capsule (125 mg total) daily for 7 days, THEN 1 capsule (125 mg total) every other day for 28 days, THEN 1 capsule (125 mg total) every 3 (three) days for 28 days. Start taking on: October 30, 2021 What changed: You were already taking a medication with the same name, and this prescription was added. Make sure you understand how and when to take each.        Follow-up Information     Imogene Burn, PA-C Follow up on 11/01/2021.   Specialty: Cardiology Contact information: [address] [place] [address] [phone]         Venia Carbon, MD Follow up in 1 week(s).   Specialties: Internal Medicine, Pediatrics Contact information: [place] [address] [phone]         Nahser, Wonda Cheng, MD .   Specialty: Cardiology Contact information: [address] [place] [address] [phone]                Allergies  Allergen Reactions   Benzalkonium Chloride     "Benzalkonium chloride is a quaternary ammonium antiseptic and disinfectant with actions and uses similar to those of other cationic surfactants. It is also used as an antimicrobial preservative for pharmaceutical products."   Augmentin [Amoxicillin-Pot Clavulanate]  Rash   Bacitracin-Polymyxin B Swelling and Rash   Neosporin [Neomycin-Bacitracin Zn-Polymyx] Rash   Sulfamethoxazole-Trimethoprim Rash   Terbinafine And Related Rash    Consultations: Cardiology, ID   Procedures/Studies: CT HEAD WO CONTRAST (5MM)  Result Date: 10/09/2021 CLINICAL DATA:  Mental status change, known schwannoma. EXAM: CT HEAD WITHOUT CONTRAST TECHNIQUE: Contiguous axial  images were obtained from the base of the skull through the vertex without intravenous contrast. RADIATION DOSE REDUCTION: This exam was performed according to the departmental dose-optimization program which includes automated exposure control, adjustment of the mA and/or kV according to patient size and/or use of iterative reconstruction technique. COMPARISON:  None. FINDINGS: Brain: No acute intracranial hemorrhage or midline shift. There is redemonstration of a heterogeneous left CP angle mass with mild mass effect on the left cerebral hemisphere. No extra-axial fluid collection. Mild atrophy is noted. Subcortical and periventricular white matter hypodensities are present bilaterally. There is no hydrocephalus. Vascular: Atherosclerotic calcification of the left vertebral artery and carotid siphons. No hyperdense vessel. Skull: Normal. Negative for fracture or focal lesion. Sinuses/Orbits: Mucosal thickening is present in the right maxillary sinus, left sphenoid sinus, and ethmoid air cells bilaterally. There is evidence of prior ocular surgery bilaterally. Other: None. IMPRESSION: 1. No acute intracranial process. 2. Atrophy with chronic microvascular ischemic changes. 3. Stable left CP angle mass. Electronically Signed   By: Brett Fairy M.D.   On: 10/09/2021 21:38   CT Chest Wo Contrast  Result Date: 10/10/2021 CLINICAL DATA:  Respiratory illness, nondiagnostic x-ray. EXAM: CT CHEST WITHOUT CONTRAST TECHNIQUE: Multidetector CT imaging of the chest was performed following the standard protocol without IV  contrast. RADIATION DOSE REDUCTION: This exam was performed according to the departmental dose-optimization program which includes automated exposure control, adjustment of the mA and/or kV according to patient size and/or use of iterative reconstruction technique. COMPARISON:  10/09/2021. FINDINGS: Cardiovascular: The heart is enlarged and there is a trace pericardial effusion. Multi-vessel coronary artery calcifications are noted. There is atherosclerotic calcification of the aorta without evidence of aneurysm. The pulmonary trunk is normal in caliber. Mediastinum/Nodes: A few prominent lymph nodes are noted in the paratracheal space on the right measuring up to 1 cm in short axis diameter. Evaluation of the hila is limited due to lack of IV contrast. No axillary lymphadenopathy. The thyroid gland and trachea are within normal limits. A hyperdense region is present in the posterior mid esophagus, possibly representing ingested debris. There are calcifications in the mid esophagus, unchanged from the previous exam. Lungs/Pleura: Apical pleural thickening is noted bilaterally. Emphysematous changes are noted in the lungs. Mild atelectasis or infiltrate is noted at the left lung base. Bilateral lower lobe bronchiectasis is noted with mild consolidation in the right lower lobe. No effusion or pneumothorax. Upper Abdomen: No acute abnormality. Musculoskeletal: No acute osseous abnormality. IMPRESSION: 1. Bilateral lower lobe bronchiectasis with patchy consolidation in the right lower lobe, concerning for pneumonia. 2. Mild atelectasis or infiltrate at the left lung base. 3. Emphysematous changes in lungs with apical pleural thickening bilaterally. 4. Aortic atherosclerosis with multi-vessel and coronary artery calcifications. Electronically Signed   By: Brett Fairy M.D.   On: 10/10/2021 05:02   CT ABDOMEN PELVIS W CONTRAST  Result Date: 10/10/2021 CLINICAL DATA:  88 year old male with acute abdominal pain. EXAM:  CT ABDOMEN AND PELVIS WITH CONTRAST TECHNIQUE: Multidetector CT imaging of the abdomen and pelvis was performed using the standard protocol following bolus administration of intravenous contrast. RADIATION DOSE REDUCTION: This exam was performed according to the departmental dose-optimization program which includes automated exposure control, adjustment of the mA and/or kV according to patient size and/or use of iterative reconstruction technique. CONTRAST:  59m OMNIPAQUE IOHEXOL 350 MG/ML SOLN COMPARISON:  Noncontrast chest CT today reported separately. Noncontrast CT Abdomen and Pelvis 07/25/2021 and earlier. FINDINGS: Lower chest: Chest CT reported separately today. Hepatobiliary:  Liver and gallbladder appear stable since 2014. Pancreas: Partially atrophied, otherwise negative. Spleen: Negative. Adrenals/Urinary Tract: Negative adrenal glands. Nonobstructed kidneys with nonspecific bilateral perinephric stranding not significantly changed from last year. Chronic left upper pole renal cyst was present in 2014. Symmetric renal enhancement, but no contrast excretion on the delayed images. No hydroureter. Diminutive bladder. Stomach/Bowel: Rectal tube in place. Generalized abnormal colonic wall thickening throughout the abdomen and pelvis. Underlying air and fluid containing colon to the rectum. Large bowel mucosal hyperenhancement most pronounced from the splenic flexure distally, but all colonic segments appear inflamed. Appendix not identified, may be diminutive or absent. Nondilated small bowel. Questionable small bowel mucosal hyperenhancement, including in the terminal ileum which is decompressed. No free air or free fluid. Stomach appears unremarkable. Vascular/Lymphatic: Extensive Aortoiliac calcified atherosclerosis. Major arterial structures in the abdomen and pelvis remain patent. Portal venous system is patent. No lymphadenopathy identified. Reproductive: Prostatomegaly redemonstrated. Other: No pelvic  free fluid. Musculoskeletal: Chronic L5 pars fractures and grade 2 L5-S1 spondylolisthesis is stable. Chronic lumbar endplate Schmorl's nodes elsewhere. No acute osseous abnormality identified. IMPRESSION: 1. Acute Pancolitis, most likely infectious. Questionable small bowel enteritis also. No bowel obstruction, free air, or free fluid. 2. No obstructive uropathy, but absent renal contrast excretion on the delayed images raises the possibility of acute intrinsic renal disease such as ATN. 3. Chest CT reported separately. 4. Aortic Atherosclerosis (ICD10-I70.0). Electronically Signed   By: Genevie Ann M.D.   On: 10/10/2021 04:59   DG CHEST PORT 1 VIEW  Result Date: 10/10/2021 CLINICAL DATA:  PICC line placement EXAM: PORTABLE CHEST 1 VIEW COMPARISON:  Previous studies including the examination of 10/09/2018 FINDINGS: Transverse diameter of heart is increased. There are no signs of pulmonary edema or focal pulmonary consolidation. There is interval placement of central venous catheter through the left upper extremity with its tip in the superior vena cava. Left lateral CP angle is not included in its entirety. There is no pneumothorax. IMPRESSION: Tip of central venous catheter is seen in the superior vena cava. Cardiomegaly. There are no new infiltrates or signs of pulmonary edema. There is no pneumothorax. Electronically Signed   By: Elmer Picker M.D.   On: 10/10/2021 16:16   DG Chest Port 1 View  Result Date: 10/09/2021 CLINICAL DATA:  ams EXAM: PORTABLE CHEST 1 VIEW.  Patient is slightly rotated. COMPARISON:  Chest x-ray 07/24/2021 FINDINGS: The heart and mediastinal contours are unchanged. Aortic calcification. Slight haziness of the right lower lobe likely due to patient rotation. Left base atelectasis. No focal consolidation. No pulmonary edema. No pleural effusion. No pneumothorax. No acute osseous abnormality. IMPRESSION: 1. No definite acute cardiopulmonary abnormality. 2.  Aortic Atherosclerosis  (ICD10-I70.0). Electronically Signed   By: Iven Finn M.D.   On: 10/09/2021 22:27   ECHOCARDIOGRAM COMPLETE  Result Date: 10/10/2021    ECHOCARDIOGRAM REPORT   Patient Name:   Leon Estes Date of Exam: 10/10/2021 Medical Rec #:  [id number]        Height:       67.0 in Accession #:    [id number]       Weight:       148.4 lb Date of Birth:  11-10-1930         BSA:          1.781 m Patient Age:    17 years         BP:           88/40 mmHg Patient Gender: M  HR:           76 bpm. Exam Location:  Inpatient Procedure: 2D Echo, Cardiac Doppler and Color Doppler Indications:    SHOCK  History:        Patient has prior history of Echocardiogram examinations, most                 recent 12/30/2020. CAD, COPD; Risk Factors:Hypertension and                 Dyslipidemia.  Sonographer:    Beryle Beams Referring Phys: [id number] ELISE L STEPHENSON IMPRESSIONS  1. Left ventricular ejection fraction, by estimation, is 30%. The left ventricle has moderately decreased function. The left ventricle demonstrates global hypokinesis. There is moderate left ventricular hypertrophy. Left ventricular diastolic parameters  are indeterminate.  2. Right ventricular systolic function is moderately reduced. The right ventricular size is moderately enlarged. There is normal pulmonary artery systolic pressure.  3. Left atrial size was moderately dilated.  4. Right atrial size was severely dilated.  5. The mitral valve is normal in structure. No evidence of mitral valve regurgitation. No evidence of mitral stenosis.  6. The aortic valve is tricuspid. Aortic valve regurgitation is not visualized. Aortic valve sclerosis is present, with no evidence of aortic valve stenosis.  7. The inferior vena cava is dilated in size with <50% respiratory variability, suggesting right atrial pressure of 15 mmHg. Comparison(s): There is new biventricular dysfunction from prior study. Working to notify team. FINDINGS  Left Ventricle: Left  ventricular ejection fraction, by estimation, is 30%. The left ventricle has moderately decreased function. The left ventricle demonstrates global hypokinesis. The left ventricular internal cavity size was normal in size. There is moderate left ventricular hypertrophy. Left ventricular diastolic parameters are indeterminate. Right Ventricle: The right ventricular size is moderately enlarged. No increase in right ventricular wall thickness. Right ventricular systolic function is moderately reduced. There is normal pulmonary artery systolic pressure. The tricuspid regurgitant velocity is 2.28 m/s, and with an assumed right atrial pressure of 15 mmHg, the estimated right ventricular systolic pressure is 31.5 mmHg. Left Atrium: Left atrial size was moderately dilated. Right Atrium: Right atrial size was severely dilated. Pericardium: There is no evidence of pericardial effusion. Mitral Valve: The mitral valve is normal in structure. No evidence of mitral valve regurgitation. No evidence of mitral valve stenosis. Tricuspid Valve: The tricuspid valve is normal in structure. Tricuspid valve regurgitation is mild . No evidence of tricuspid stenosis. Aortic Valve: The aortic valve is tricuspid. Aortic valve regurgitation is not visualized. Aortic valve sclerosis is present, with no evidence of aortic valve stenosis. Aortic valve mean gradient measures 3.0 mmHg. Aortic valve peak gradient measures 6.0  mmHg. Aortic valve area, by VTI measures 1.13 cm. Pulmonic Valve: The pulmonic valve was not well visualized. Pulmonic valve regurgitation is not visualized. No evidence of pulmonic stenosis. Aorta: The aortic root and ascending aorta are structurally normal, with no evidence of dilitation. Venous: The inferior vena cava is dilated in size with less than 50% respiratory variability, suggesting right atrial pressure of 15 mmHg. IAS/Shunts: No atrial level shunt detected by color flow Doppler.  LEFT VENTRICLE PLAX 2D LVIDd:          3.80 cm LVIDs:         2.70 cm LV PW:         1.60 cm LV IVS:        1.30 cm LVOT diam:     1.90 cm LV SV:  18 LV SV Index:   10 LVOT Area:     2.84 cm  LV Volumes (MOD) LV vol d, MOD A2C: 74.4 ml LV vol d, MOD A4C: 61.4 ml LV vol s, MOD A2C: 51.1 ml LV vol s, MOD A4C: 40.0 ml LV SV MOD A2C:     23.3 ml LV SV MOD A4C:     61.4 ml LV SV MOD BP:      22.4 ml RIGHT VENTRICLE            IVC RV Basal diam:  4.10 cm    IVC diam: 2.20 cm RV Mid diam:    3.80 cm RV S prime:     9.46 cm/s TAPSE (M-mode): 1.4 cm LEFT ATRIUM             Index        RIGHT ATRIUM           Index LA diam:        4.50 cm 2.53 cm/m   RA Area:     28.80 cm LA Vol (A2C):   72.9 ml 40.93 ml/m  RA Volume:   103.00 ml 57.83 ml/m LA Vol (A4C):   72.6 ml 40.76 ml/m LA Biplane Vol: 73.2 ml 41.10 ml/m  AORTIC VALVE                    PULMONIC VALVE AV Area (Vmax):    1.09 cm     PV Vmax:       0.41 m/s AV Area (Vmean):   1.06 cm     PV Vmean:      26.200 cm/s AV Area (VTI):     1.13 cm     PV VTI:        0.056 m AV Vmax:           122.00 cm/s  PV Peak grad:  0.7 mmHg AV Vmean:          81.400 cm/s  PV Mean grad:  0.0 mmHg AV VTI:            0.160 m AV Peak Grad:      6.0 mmHg AV Mean Grad:      3.0 mmHg LVOT Vmax:         46.70 cm/s LVOT Vmean:        30.400 cm/s LVOT VTI:          0.064 m LVOT/AV VTI ratio: 0.40  AORTA Ao Root diam: 3.20 cm Ao Asc diam:  2.90 cm MR Peak grad: 27.7 mmHg   TRICUSPID VALVE MR Mean grad: 21.0 mmHg   TR Peak grad:   20.8 mmHg MR Vmax:      263.00 cm/s TR Mean grad:   14.0 mmHg MR Vmean:     222.0 cm/s  TR Vmax:        228.00 cm/s                           TR Vmean:       180.0 cm/s                            SHUNTS                           Systemic VTI:  0.06 m  Systemic Diam: 1.90 cm Rudean Haskell MD Electronically signed by Rudean Haskell MD Signature Date/Time: 10/10/2021/1:47:12 PM    Final      Discharge Exam: Vitals:   10/17/21 0858 10/17/21 0948  BP:   116/75  Pulse: 78 (!) 58  Resp: 16 17  Temp:  98.8 F (37.1 C)  SpO2:  94%   Vitals:   10/16/21 2034 10/17/21 0525 10/17/21 0858 10/17/21 0948  BP: (!) 118/55 138/75  116/75  Pulse: 73 80 78 (!) 58  Resp: '18 20 16 17  '$ Temp: 98.4 F (36.9 C) 98.6 F (37 C)  98.8 F (37.1 C)  TempSrc: Oral Oral  Oral  SpO2: 96% 94%  94%  Weight:  59.9 kg      General: Pt is alert, awake, not in acute distress Cardiovascular: RRR, S1/S2 +, no rubs, no gallops Respiratory: CTA bilaterally, no wheezing, no rhonchi Abdominal: Soft, NT, ND, bowel sounds + Extremities: no edema, no cyanosis    The results of significant diagnostics from this hospitalization (including imaging, microbiology, ancillary and laboratory) are listed below for reference.     Microbiology: Recent Results (from the past 240 hour(s))  C Difficile Quick Screen w PCR reflex     Status: Abnormal   Collection Time: 10/09/21  7:09 PM   Specimen: Stool  Result Value Ref Range Status   C Diff antigen POSITIVE (A) NEGATIVE Final   C Diff toxin POSITIVE (A) NEGATIVE Final   C Diff interpretation Toxin producing C. difficile detected.  Final    Comment: RESULT CALLED TO, READ BACK BY AND VERIFIED WITH: RN PAYTON BLANCHARD 10/09/21'@23'$ :20 BY TW Performed at [facility], [place] [address], [place], [place] [address]   Blood culture (routine x 2)     Status: None   Collection Time: 10/09/21  7:31 PM   Specimen: BLOOD  Result Value Ref Range Status   Specimen Description BLOOD BLOOD RIGHT WRIST  Final   Special Requests   Final    BOTTLES DRAWN AEROBIC AND ANAEROBIC Blood Culture adequate volume   Culture   Final    NO GROWTH 5 DAYS Performed at [facility], [employer] [address], [place], [place] [address]    Report Status 10/14/2021 FINAL  Final  MRSA Next Gen by PCR, Nasal     Status: Abnormal   Collection Time: 10/10/21  5:17 AM   Specimen: Nasal Mucosa; Nasal Swab  Result Value Ref Range Status   MRSA by PCR Next  Gen DETECTED (A) NOT DETECTED Final    Comment: RESULT CALLED TO, READ BACK BY AND VERIFIED WITH: F. Pricilla Loveless AT 0630 10/10/21 D. VANHOOK (NOTE) The GeneXpert MRSA Assay (FDA approved for NASAL specimens only), is one component of a comprehensive MRSA colonization surveillance program. It is not intended to diagnose MRSA infection nor to guide or monitor treatment for MRSA infections. Test performance is not FDA approved in patients less than 15 years old. Performed at [facility], [place] [address] [place], [place] [address]      Labs: BNP (last 3 results) Recent Labs    12/27/20 1754  BNP 932.3*   Basic Metabolic Panel: Recent Labs  Lab 10/12/21 0418 10/13/21 0506 10/14/21 0126 10/15/21 0150 10/16/21 0200 10/17/21 0813  NA 137 136 136 139 139 139  K 3.4* 3.7 4.0 3.3* 3.4* 3.8  CL 105 111 108 108 110 107  CO2 18* 15* 20* 21* 22 22  GLUCOSE 92 101* 106* 117* 93  114*  BUN 34* 32* 33* 29* 22 19  CREATININE 1.56* 1.43* 1.37* 1.39* 1.28* 1.18  CALCIUM 8.0* 8.2* 8.0* 8.4* 8.4* 8.6*  MG 2.4  --  1.8  --  1.8 1.6*   Liver Function Tests: Recent Labs  Lab 10/11/21 0102 10/12/21 0418 10/13/21 0506 10/14/21 0126 10/15/21 0150  AST 77* 94* 62* 40 29  ALT 86* 119* 111* 88* 71*  ALKPHOS 61 70 82 79 76  BILITOT 0.8 0.6 QUANTITY NOT SUFFICIENT, UNABLE TO PERFORM TEST 0.3 0.4  PROT 5.0* 5.2* 5.2* 5.0* 5.4*  ALBUMIN 2.5* 2.4* 2.5* 2.4* 2.6*   No results for input(s): LIPASE, AMYLASE in the last 168 hours. No results for input(s): AMMONIA in the last 168 hours. CBC: Recent Labs  Lab 10/11/21 0102 10/11/21 0641 10/12/21 0418 10/13/21 1200 10/14/21 0126 10/15/21 0150  WBC 11.1*  --  15.9* 11.8* 11.5* 7.8  NEUTROABS 9.3*  --  13.7* 9.8* 8.5* 4.5  HGB 8.5* 8.5* 7.9* 9.3* 8.2* 8.9*  HCT 25.7* 25.0* 24.2* 28.5* 25.4* 27.8*  MCV 78.8*  --  78.8* 79.8* 78.2* 79.9*  PLT 181  --  170 169 187 193   Cardiac Enzymes: No results for input(s): CKTOTAL, CKMB, CKMBINDEX,  TROPONINI in the last 168 hours. BNP: Invalid input(s): POCBNP CBG: Recent Labs  Lab 10/12/21 0807 10/12/21 0835 10/12/21 1218 10/12/21 1530 10/12/21 1952  GLUCAP 64* 97 108* 95 164*   D-Dimer No results for input(s): DDIMER in the last 72 hours. Hgb A1c No results for input(s): HGBA1C in the last 72 hours. Lipid Profile No results for input(s): CHOL, HDL, LDLCALC, TRIG, CHOLHDL, LDLDIRECT in the last 72 hours. Thyroid function studies No results for input(s): TSH, T4TOTAL, T3FREE, THYROIDAB in the last 72 hours.  Invalid input(s): FREET3 Anemia work up No results for input(s): VITAMINB12, FOLATE, FERRITIN, TIBC, IRON, RETICCTPCT in the last 72 hours. Urinalysis    Component Value Date/Time   COLORURINE YELLOW 10/10/2021 0345   APPEARANCEUR HAZY (A) 10/10/2021 0345   APPEARANCEUR Clear 04/29/2013 1543   LABSPEC 1.019 10/10/2021 0345   LABSPEC 1.020 04/29/2013 1543   PHURINE 5.0 10/10/2021 0345   GLUCOSEU NEGATIVE 10/10/2021 0345   GLUCOSEU Negative 04/29/2013 1543   HGBUR NEGATIVE 10/10/2021 0345   BILIRUBINUR NEGATIVE 10/10/2021 0345   BILIRUBINUR + 07/25/2018 1304   BILIRUBINUR Negative 04/29/2013 1543   KETONESUR NEGATIVE 10/10/2021 0345   PROTEINUR 100 (A) 10/10/2021 0345   UROBILINOGEN 0.2 07/25/2018 1304   UROBILINOGEN 0.2 03/08/2012 1624   NITRITE NEGATIVE 10/10/2021 0345   LEUKOCYTESUR NEGATIVE 10/10/2021 0345   LEUKOCYTESUR Negative 04/29/2013 1543   Sepsis Labs Invalid input(s): PROCALCITONIN,  WBC,  LACTICIDVEN Microbiology Recent Results (from the past 240 hour(s))  C Difficile Quick Screen w PCR reflex     Status: Abnormal   Collection Time: 10/09/21  7:09 PM   Specimen: Stool  Result Value Ref Range Status   C Diff antigen POSITIVE (A) NEGATIVE Final   C Diff toxin POSITIVE (A) NEGATIVE Final   C Diff interpretation Toxin producing C. difficile detected.  Final    Comment: RESULT CALLED TO, READ BACK BY AND VERIFIED WITH: RN PAYTON BLANCHARD  10/09/21'@23'$ :20 BY TW Performed at [facility], [place] [address] [place], [place] [address]   Blood culture (routine x 2)     Status: None   Collection Time: 10/09/21  7:31 PM   Specimen: BLOOD  Result Value Ref Range Status   Specimen Description BLOOD BLOOD RIGHT WRIST  Final   Special Requests   Final    BOTTLES DRAWN AEROBIC AND ANAEROBIC Blood Culture adequate volume   Culture   Final    NO GROWTH 5 DAYS Performed at [facility], [address] [place], [place] [address]    Report Status 10/14/2021 FINAL  Final  MRSA Next Gen by PCR, Nasal     Status: Abnormal   Collection Time: 10/10/21  5:17 AM   Specimen: Nasal Mucosa; Nasal Swab  Result Value Ref Range Status   MRSA by PCR Next Gen DETECTED (A) NOT DETECTED Final    Comment: RESULT CALLED TO, READ BACK BY AND VERIFIED WITH: F. Pricilla Loveless AT 6283 10/10/21 D. VANHOOK (NOTE) The GeneXpert MRSA Assay (FDA approved for NASAL specimens only), is one component of a comprehensive MRSA colonization surveillance program. It is not intended to diagnose MRSA infection nor to guide or monitor treatment for MRSA infections. Test performance is not FDA approved in patients less than 86 years old. Performed at [facility], [place] [address], [place], [place] [address]      Time coordinating discharge: Over 30 minutes  SIGNED:   Darliss Cheney, MD  [facility] 10/17/2021, 9:53 AM *Please note that this is a verbal dictation therefore any spelling or grammatical errors are due to the "[employer] One" system interpretation. If 7PM-7AM, please contact night-coverage [email or web address]

## 2021-10-17 NOTE — Plan of Care (Signed)

## 2021-10-17 NOTE — TOC Transition Note (Signed)
Transition of Care (TOC) - CM/SW Discharge Note ? ? ?Patient Details  ?Name: Leon Estes ?MRN: [id number] ?Date of Birth: November 06, 1930 ? ?Transition of Care (TOC) CM/SW Contact:  ?Tom-Johnson, Renea Ee, RN ?Phone Number: ?10/17/2021, 12:15 PM ? ? ?Clinical Narrative:    ? ?Patient is scheduled for discharge today. Home health PT/OT recommended but wife declines. States she can take care of patient at home and has all the equipments and help needed at home. Wife to pickup  prescription from [facility] and transport home. No further TOC needs noted. ? ?Final next level of care: Home/Self Care ?Barriers to Discharge: Barriers Resolved ? ? ?Patient Goals and CMS Choice ?Patient states their goals for this hospitalization and ongoing recovery are:: To return home ?CMS Medicare.gov Compare Post Acute Care list provided to:: Patient ?Choice offered to / list presented to : Patient, Spouse ? ?Discharge Placement ?  ?           ?  ?Patient to be transferred to facility by: Wife ?  ?  ? ?Discharge Plan and Services ?  ?  ?           ?DME Arranged: N/A ?DME Agency: NA ?  ?  ?  ?HH Arranged: Refused HH ?[place] Agency: NA ?  ?  ?  ? ?Social Determinants of Health (SDOH) Interventions ?  ? ? ?Readmission Risk Interventions ?No flowsheet data found. ? ? ? ? ?

## 2021-10-17 NOTE — Progress Notes (Signed)
? ?Progress Note ? ?Patient Name: Leon Estes ?Date of Encounter: 10/17/2021 ? ?[facility] Cardiologist: Mertie Moores, MD  ? ?Subjective  ? ?Pt is breathing OK  Denies CP  No dizziness ?Laying flat in bed ?Inpatient Medications  ?  ?Scheduled Meds: ? aspirin EC  81 mg Oral Daily  ? carvedilol  3.125 mg Oral BID WC  ? fluticasone furoate-vilanterol  1 puff Inhalation Daily  ? furosemide  20 mg Oral Daily  ? heparin  5,000 Units Subcutaneous Q8H  ? mouth rinse  15 mL Mouth Rinse BID  ? QUEtiapine  25 mg Oral QHS  ? tamsulosin  0.4 mg Oral Daily  ? vancomycin  500 mg Oral QID  ? Followed by  ? [START ON 10/29/2021] vancomycin  125 mg Oral BID  ? Followed by  ? [START ON 11/06/2021] vancomycin  125 mg Oral Daily  ? Followed by  ? [START ON 11/13/2021] vancomycin  125 mg Oral QODAY  ? Followed by  ? [START ON 12/11/2021] vancomycin  125 mg Oral Q3 days  ? ?Continuous Infusions: ? ? ?PRN Meds: ?acetaminophen **OR** acetaminophen  ? ?Vital Signs  ?  ?Vitals:  ? 10/16/21 1014 10/16/21 1733 10/16/21 2034 10/17/21 0525  ?BP: 125/88 (!) 125/92 (!) 118/55 138/75  ?Pulse: 64 91 73 80  ?Resp:  '16 18 20  '$ ?Temp:  98.6 ?F (37 ?C) 98.4 ?F (36.9 ?C) 98.6 ?F (37 ?C)  ?TempSrc:  Oral Oral Oral  ?SpO2:  96% 96% 94%  ?Weight:    59.9 kg  ? ? ?Intake/Output Summary (Last 24 hours) at 10/17/2021 0837 ?Last data filed at 10/17/2021 7544 ?Gross per 24 hour  ?Intake 680 ml  ?Output 1050 ml  ?Net -370 ml  ? ?Net neg 8.6 L   ?Last 3 Weights 10/17/2021 10/15/2021 10/13/2021  ?Weight (lbs) 132 lb 0.9 oz 136 lb 3.9 oz 149 lb 14.6 oz  ?Weight (kg) 59.9 kg 61.8 kg 68 kg  ?   ? ?Telemetry  ?  ?Atrial fibrillation  Rates 70s    - Personally Reviewed ? ?ECG  ?  ?No new EKG to review - Personally Reviewed ? ?Physical Exam  ? ?GEN: pt is in no acute distress ?HEENT: Normal ?NECK: No JVD;  ?CARDIAC:RRR, no murmurs ?RESPIRATORY:  Clear to auscultation without rales, wheezing or rhonchi  ?ABDOMEN: Soft, non-tender, non-distended ?MUSCULOSKELETAL:  No LE  edema; ?SKIN: Warm and dry ?NEUROLOGIC:  Alert and oriented x 3 ?PSYCHIATRIC:  Normal affect   ?Labs  ?  ?High Sensitivity Troponin:  No results for input(s): TROPONINIHS in the last 720 hours.   ?Chemistry ?Recent Labs  ?Lab 10/12/21 ?0418 10/13/21 ?9201 10/14/21 ?0126 10/15/21 ?0150 10/16/21 ?0200  ?NA 137 136 136 139 139  ?K 3.4* 3.7 4.0 3.3* 3.4*  ?CL 105 111 108 108 110  ?CO2 18* 15* 20* 21* 22  ?GLUCOSE 92 101* 106* 117* 93  ?BUN 34* 32* 33* 29* 22  ?CREATININE 1.56* 1.43* 1.37* 1.39* 1.28*  ?CALCIUM 8.0* 8.2* 8.0* 8.4* 8.4*  ?MG 2.4  --  1.8  --  1.8  ?PROT 5.2* 5.2* 5.0* 5.4*  --   ?ALBUMIN 2.4* 2.5* 2.4* 2.6*  --   ?AST 94* 62* 40 29  --   ?ALT 119* 111* 88* 71*  --   ?ALKPHOS 70 82 79 76  --   ?BILITOT 0.6 QUANTITY NOT SUFFICIENT, UNABLE TO PERFORM TEST 0.3 0.4  --   ?GFRNONAA 42* 47* 50* 49* 54*  ?ANIONGAP  $'14 10 8 10 7  'p$ ?  ?Lipids No results for input(s): CHOL, TRIG, HDL, LABVLDL, LDLCALC, CHOLHDL in the last 168 hours.  ?Hematology ?Recent Labs  ?Lab 10/13/21 ?1200 10/14/21 ?0126 10/15/21 ?0150  ?WBC 11.8* 11.5* 7.8  ?RBC 3.57* 3.25* 3.48*  ?HGB 9.3* 8.2* 8.9*  ?HCT 28.5* 25.4* 27.8*  ?MCV 79.8* 78.2* 79.9*  ?MCH 26.1 25.2* 25.6*  ?MCHC 32.6 32.3 32.0  ?RDW 17.2* 17.2* 17.2*  ?PLT 169 187 193  ? ?Thyroid No results for input(s): TSH, FREET4 in the last 168 hours.  ?BNPNo results for input(s): BNP, PROBNP in the last 168 hours.  ?DDimer No results for input(s): DDIMER in the last 168 hours.  ? ?Radiology  ?  ?No results found. ? ?Cardiac Studies  ? ?Echo 10/10/21: ? 1. Left ventricular ejection fraction, by estimation, is 30%. The left  ?ventricle has moderately decreased function. The left ventricle  ?demonstrates global hypokinesis. There is moderate left ventricular  ?hypertrophy. Left ventricular diastolic parameters  ? are indeterminate.  ? 2. Right ventricular systolic function is moderately reduced. The right  ?ventricular size is moderately enlarged. There is normal pulmonary artery  ?systolic  pressure.  ? 3. Left atrial size was moderately dilated.  ? 4. Right atrial size was severely dilated.  ? 5. The mitral valve is normal in structure. No evidence of mitral valve  ?regurgitation. No evidence of mitral stenosis.  ? 6. The aortic valve is tricuspid. Aortic valve regurgitation is not  ?visualized. Aortic valve sclerosis is present, with no evidence of aortic  ?valve stenosis.  ? 7. The inferior vena cava is dilated in size with <50% respiratory  ?variability, suggesting right atrial pressure of 15 mmHg.  ? ?Patient Profile  ?   ?88 y.o. male with a history of CAD s/p stenting to RCA in 1997, paroxysmal atrial fibrillation not on anticoagulation, hypertension, hyperlipidemia, CKD stage III, chronic anemia, COPD, BPH, and recurrent C. Diff colitis who is being seen for the evaluation of CHF ? ?Assessment & Plan  ?  ?New Onset Systolic CHF: Patient presented with fever, diarrhea, and altered mental status and was admitted for sepsis secondary to recurrent C. Diff colitis. Echo this admission showed LVEF of 30% with global hypokinesis and moderate LVH.  RV moderately enlarged with moderately reduced systolic function but PASP normal.  ?The pt received  IV fluid resuscitation on admission and subsequently became hypervolemic.  Diuresed with IV Lasix 40 mg twice daily. ?-he is now 8.6 L negative    ?-Switched to lasix 20 daily yesterday   Wts and I/O will need to be followed closely      ?-continue Carvedilol 3.'125mg'$  BID ?- Cr near baseline   I would add low dose ARB   12.5 Cozaar   Follow as outpt ?-Continue to monitor daily weights, strict I/Os, and renal function. ?- Maintain K greater than 4, mag greater than 2 ?- Drop in EF may be secondary to sepsis. However, cannot rule out progressive CAD. Given advanced age, possible underlying dementia, and other comorbidities, he is not a good cardiac cath candidate. Would continue to treat medically.  plan for repeat echo as outpatient  to reassess EF function  after starting GDMT. ?  ?I have placed on low Na diet   Have dietary come by to review low Na ? ?OK to dc from cardiac standpoint    Will make sure pt has follow up    ? ?Paroxysmal Atrial Fibrillation: Presented in sinus tachycardia with  frequent ectopy but now in rate controlled atrial fibrillation. CHA2DS2-VASc = 5 (CAD, CHF, HTN, age x2). Previously not felt to be a anticoagulation candidate due to high fall risk. Also has chronic anemia with hemoglobin of 7.9 currently ?- HR is well controlled ?- continue Carvedilol 3.'125mg'$  BID ?  ?Non-Sustained VT ?  PVCs  Short bursts NSVT    ?- Add coreg 3.125 mg BID ?- replete K+ to keep > 4 and Mag > 2 ?  ?CAD ?S/p remote stenting to RCA in 1997. ?- No Chest pain.    ?- continue ASA '81mg'$  daily ?- Continue statin. ?  ?Hypertension ?History of hypertension but hypotensive this admission in the setting of sepsis.  Required Levophed but this was able to be stopped on 10/10/2021. ?- Bp remains well controlled ? ?  ?Acute on Chronic CKD Stage II ?Creatinine 1.32 on admission and peaked at 2.01 on 10/11/2021.  Baseline around 0.7 to 0.9. ?- improved Cr   Toay it is 1.18   ?  ?Otherwise, per primary team: ?- Sepsis secondary to recurrent C. Diff colitis ?- Acute metabolic encephalopathy ?- Aspiration pneumonia ?- COPD ?- Chronic anemia  ? ?Will sign off for now   Please call with questions  OK to go home from a cardiac standpoint   ? ?For questions or updates, please contact [place] ?Please consult [email or web address] for contact info under  ? ?  ?   ?Signed, ?Dorris Carnes, MD  ?10/17/2021, 8:37 AM   ? ?

## 2021-10-18 ENCOUNTER — Telehealth: Payer: Self-pay

## 2021-10-18 NOTE — Telephone Encounter (Signed)
Transition Care Management Follow-up Telephone Call ?Date of discharge and from where: [facility] 10-17-21 Dx: recurrent C-diff diarrhea  ?How have you been since you were released from the hospital? Doing good ?Any questions or concerns? No ? ?Items Reviewed: ?Did the pt receive and understand the discharge instructions provided? Yes  ?Medications obtained and verified? Yes  ?Other? No  ?Any new allergies since your discharge? No  ?Dietary orders reviewed? Yes ?Do you have support at home? Yes  ? ?Home Care and Equipment/Supplies: ?Were home health services ordered? no ?If so, what is the name of the agency? na  ?Has the agency set up a time to come to the patient's home? not applicable ?Were any new equipment or medical supplies ordered?  No ?What is the name of the medical supply agency? na ?Were you able to get the supplies/equipment? not applicable ?Do you have any questions related to the use of the equipment or supplies? No ? ?Functional Questionnaire: (I = Independent and D = Dependent) ?ADLs: I ? ?Bathing/Dressing- D ? ?Meal Prep- I ? ?Eating- I ? ?Maintaining continence- I ? ?Transferring/Ambulation- I-walker ? ?Managing Meds- D ? ?Follow up appointments reviewed: ? ?PCP Hospital f/u appt confirmed? Yes  Scheduled to see Dr Silvio Pate on 10-23-21- @ 390. ?[facility] f/u appt confirmed? No  . ?Are transportation arrangements needed? No  ?If their condition worsens, is the pt aware to call PCP or go to the Emergency Dept.? Yes ?Was the patient provided with contact information for the PCP's office or ED? Yes ?Was to pt encouraged to call back with questions or concerns? Yes  ?

## 2021-10-19 ENCOUNTER — Telehealth: Payer: Self-pay

## 2021-10-19 NOTE — Progress Notes (Deleted)
? ?Cardiology Office Note   ? ?Date:  10/19/2021  ? ?ID:  Leon Estes, DOB [birth year], MRN [id number] ? ? ?PCP:  Venia Carbon, MD ?  ?[place]  ?Cardiologist:  Mertie Moores, MD *** ?Advanced Practice Provider:  No care team member to display ?Electrophysiologist:  None  ? ?[id number]}  ? ?No chief complaint on file. ? ? ?History of Present Illness:  ?Leon Estes is a 88 y.o. male with a history of CAD s/p stenting to RCA in 1997, paroxysmal atrial fibrillation not on anticoagulation due to chronic anemia and high fall risk, hypertension, hyperlipidemia, CKD stage III, chronic anemia, COPD, BPH, and recurrent C. Diff colitis  ? ?Patient discharged 10/17/21 after admission for sepsis secondary to recurring C. Diff colitis. Cardiology saw him for new systolic CHF EF 09% on echo global HK mod LVH & RV enlarged with mod decrease sys function. He had received IV resuscitation on admission. Diuresed 8.6L . Carvedilol and cozaar added. Reduced EF thought secondary to sepsiss but could be CAD. Given advanced age, possible underlying dementia, and other comorbidities, he is not a good cardiac cath candidate. Would continue to treat medically.  plan for repeat echo as outpatient  to reassess EF function after starting GDMT.  ? ?Past Medical History:  ?Diagnosis Date  ? Asthma   ? BPH (benign prostatic hypertrophy)   ? Cataract   ? Chronic renal disease, stage III ([place])   ? Chronic sinusitis   ? COPD (chronic obstructive pulmonary disease) ([place])   ? Coronary artery disease   ? 2 stents  ? Gall stones   ? Hearing loss   ? DOES NOT WEAR HEARING AIDS  ? High cholesterol   ? Hypertension   ? Lactic acid acidosis 10/12/2021  ? Macular degeneration   ? right eye  ? Septic shock ([place]) 10/10/2021  ? ? ?Past Surgical History:  ?Procedure Laterality Date  ? APPENDECTOMY    ? CORONARY ANGIOPLASTY WITH STENT PLACEMENT  1998  ? 2 stents  ? TRANSURETHRAL RESECTION OF PROSTATE    ? ? ?Current Medications: ?No  outpatient medications have been marked as taking for the 11/01/21 encounter (Appointment) with Imogene Burn, PA-C.  ?  ? ?Allergies:   Benzalkonium chloride, Augmentin [amoxicillin-pot clavulanate], Bacitracin-polymyxin b, Neosporin [neomycin-bacitracin zn-polymyx], Sulfamethoxazole-trimethoprim, and Terbinafine and related  ? ?Social History  ? ?Socioeconomic History  ? Marital status: Married  ?  Spouse name: Not on file  ? Number of children: 4  ? Years of education: Not on file  ? Highest education level: Not on file  ?Occupational History  ? Occupation: Drove truck and concrete work  ?Tobacco Use  ? Smoking status: Former  ?  Types: Cigarettes  ?  Quit date: 08/13/2005  ?  Years since quitting: 16.1  ?  Passive exposure: Past  ? Smokeless tobacco: Current  ?  Types: Chew  ? Tobacco comments:  ?  discussed stopping this (only once in a while)  ?Vaping Use  ? Vaping Use: Never used  ?Substance and Sexual Activity  ? Alcohol use: No  ? Drug use: No  ? Sexual activity: Not on file  ?Other Topics Concern  ? Not on file  ?Social History Narrative  ? No living will  ? Wife, then [place] daughter should make decisions  ? Would accept resuscitation  ? Would probably accept tube feeds  ? ?Social Determinants of Health  ? ?Financial Resource Strain: Not on file  ?  Food Insecurity: Not on file  ?Transportation Needs: Not on file  ?Physical Activity: Not on file  ?Stress: Not on file  ?Social Connections: Not on file  ?  ? ?Family History:  The patient's ***family history includes Diabetes in his father; Heart disease in his brother.  ? ?ROS:   ?Please see the history of present illness.    ?ROS All other systems reviewed and are negative. ? ? ?PHYSICAL EXAM:   ?VS:  There were no vitals taken for this visit.  ?Physical Exam  ?GEN: Well nourished, well developed, in no acute distress  ?HEENT: normal  ?Neck: no JVD, carotid bruits, or masses ?Cardiac:RRR; no murmurs, rubs, or gallops  ?Respiratory:  clear to auscultation  bilaterally, normal work of breathing ?GI: soft, nontender, nondistended, + BS ?Ext: without cyanosis, clubbing, or edema, Good distal pulses bilaterally ?MS: no deformity or atrophy  ?Skin: warm and dry, no rash ?Neuro:  Alert and Oriented x 3, Strength and sensation are intact ?Psych: euthymic mood, full affect ? ?Wt Readings from Last 3 Encounters:  ?10/17/21 132 lb 0.9 oz (59.9 kg)  ?09/28/21 141 lb (64 kg)  ?09/25/21 145 lb 9 oz (66 kg)  ?  ? ? ?Studies/Labs Reviewed:  ? ?EKG:  EKG is*** ordered today.  The ekg ordered today demonstrates *** ? ?Recent Labs: ?12/27/2020: B Natriuretic Peptide 689.3 ?04/18/2021: TSH 1.050 ?10/15/2021: ALT 71; Hemoglobin 8.9; Platelets 193 ?10/17/2021: BUN 19; Creatinine, Ser 1.18; Magnesium 1.6; Potassium 3.8; Sodium 139  ? ?Lipid Panel ?   ?Component Value Date/Time  ? TRIG 107 01/09/2021 0400  ? ? ?Additional studies/ records that were reviewed today include:  ?Echo 10/10/21: ? 1. Left ventricular ejection fraction, by estimation, is 30%. The left  ?ventricle has moderately decreased function. The left ventricle  ?demonstrates global hypokinesis. There is moderate left ventricular  ?hypertrophy. Left ventricular diastolic parameters  ? are indeterminate.  ? 2. Right ventricular systolic function is moderately reduced. The right  ?ventricular size is moderately enlarged. There is normal pulmonary artery  ?systolic pressure.  ? 3. Left atrial size was moderately dilated.  ? 4. Right atrial size was severely dilated.  ? 5. The mitral valve is normal in structure. No evidence of mitral valve  ?regurgitation. No evidence of mitral stenosis.  ? 6. The aortic valve is tricuspid. Aortic valve regurgitation is not  ?visualized. Aortic valve sclerosis is present, with no evidence of aortic  ?valve stenosis.  ? 7. The inferior vena cava is dilated in size with <50% respiratory  ?variability, suggesting right atrial pressure of 15 mmHg.  ?  ? ? ?Risk Assessment/Calculations:   ?{Does this patient  have ATRIAL FIBRILLATION?[phone]} ? ? ? ? ?ASSESSMENT:   ? ?No diagnosis found. ? ? ?PLAN:  ?In order of problems listed above: ? ?New systolic CHF EF 85% in the setting of sepsis. On carvedilol/losartan. Plan to repeat echo ? ?CAD remote stenting RCA 02774 on ASA/statin ? ?PAF no anticoag due to chronic anemia & high fall risk. Hgb 7.9 in hospital ? ?HTN ? ?HLD ? ?CKD stage 3  ? ?Shared Decision Making/Informed Consent   ?{Are you ordering a CV Procedure (e.g. stress test, cath, DCCV, TEE, etc)?   Press F2        :[id number]}  ? ? ?Medication Adjustments/Labs and Tests Ordered: ?Current medicines are reviewed at length with the patient today.  Concerns regarding medicines are outlined above.  Medication changes, Labs and Tests ordered today are listed in the Patient  Instructions below. ?There are no Patient Instructions on file for this visit.  ? ?Signed, ?Ermalinda Barrios, PA-C  ?10/19/2021 7:56 AM    ?[place] ?[place], [place], [place]  [address] ?Phone: [phone]; Fax: [phone]  ? ? ?

## 2021-10-19 NOTE — Telephone Encounter (Signed)
Spoke to pt's wife. He is not having pain from C.Diff. She says its in his upper abdomen, epigastric area. He used to be on omeprazole, but the hospital stopped it. Asking if he can take something for his GERD. She is aware Dr Silvio Pate is out of the office. ?

## 2021-10-19 NOTE — Telephone Encounter (Signed)
Patient was seen at hospital and treatment stared for C diff on 10/09/21. Patient has started having increased abdominal pain. I have sent to access nurse for triage.  ?

## 2021-10-19 NOTE — Telephone Encounter (Signed)
Received call from Triage Patient and wife refusing to go to ED as directed. Request call from Larene [place] to talk to them Sending message and Teams to let shannon know message is being sent to her.  ?

## 2021-10-19 NOTE — Telephone Encounter (Signed)
Tried calling wife. No answer. No VM to leave a message. ?

## 2021-10-20 ENCOUNTER — Other Ambulatory Visit: Payer: Self-pay | Admitting: Internal Medicine

## 2021-10-20 ENCOUNTER — Other Ambulatory Visit: Payer: Self-pay | Admitting: Family

## 2021-10-20 DIAGNOSIS — Z8659 Personal history of other mental and behavioral disorders: Secondary | ICD-10-CM | POA: Diagnosis not present

## 2021-10-20 DIAGNOSIS — A0472 Enterocolitis due to Clostridium difficile, not specified as recurrent: Secondary | ICD-10-CM | POA: Diagnosis not present

## 2021-10-20 DIAGNOSIS — Z8619 Personal history of other infectious and parasitic diseases: Secondary | ICD-10-CM | POA: Diagnosis not present

## 2021-10-20 DIAGNOSIS — Z515 Encounter for palliative care: Secondary | ICD-10-CM | POA: Diagnosis not present

## 2021-10-20 MED ORDER — OMEPRAZOLE 20 MG PO CPDR: 20.0000 mg | DELAYED_RELEASE_CAPSULE | Freq: Every day | ORAL | 3 refills | Status: DC

## 2021-10-20 NOTE — Telephone Encounter (Signed)
Spoke to pt's wife. He will start back taking the omeprazole. ?

## 2021-10-20 NOTE — Telephone Encounter (Signed)
Last filled 08-18-21 #30 ?Last OV 09-28-21 ?Next OV 10-23-21 ?[place] ?

## 2021-10-23 ENCOUNTER — Ambulatory Visit (INDEPENDENT_AMBULATORY_CARE_PROVIDER_SITE_OTHER): Payer: PPO | Admitting: Internal Medicine

## 2021-10-23 ENCOUNTER — Encounter: Payer: Self-pay | Admitting: Internal Medicine

## 2021-10-23 ENCOUNTER — Other Ambulatory Visit: Payer: Self-pay | Admitting: Internal Medicine

## 2021-10-23 ENCOUNTER — Other Ambulatory Visit: Payer: Self-pay

## 2021-10-23 VITALS — BP 118/70 | HR 85 | Temp 98.3°F | Ht 67.0 in | Wt 140.0 lb

## 2021-10-23 DIAGNOSIS — I5022 Chronic systolic (congestive) heart failure: Secondary | ICD-10-CM | POA: Diagnosis not present

## 2021-10-23 DIAGNOSIS — A0471 Enterocolitis due to Clostridium difficile, recurrent: Secondary | ICD-10-CM | POA: Diagnosis not present

## 2021-10-23 DIAGNOSIS — N183 Chronic kidney disease, stage 3 unspecified: Secondary | ICD-10-CM | POA: Diagnosis not present

## 2021-10-23 DIAGNOSIS — J449 Chronic obstructive pulmonary disease, unspecified: Secondary | ICD-10-CM

## 2021-10-23 MED ORDER — SIMVASTATIN 40 MG PO TABS: 40.0000 mg | ORAL_TABLET | Freq: Every day | ORAL | 3 refills | Status: DC

## 2021-10-23 MED ORDER — BUDESONIDE-FORMOTEROL FUMARATE 160-4.5 MCG/ACT IN AERO: 2.0000 | INHALATION_SPRAY | Freq: Two times a day (BID) | RESPIRATORY_TRACT | 5 refills | Status: DC

## 2021-10-23 NOTE — Assessment & Plan Note (Signed)
Has recurred at least 5-6 times with severe illness (septic this time) ?Recurrent extended vancomycin. Past flagyl. Past dificid also ?Will see if he can see someone to do a fecal microbiota transplant ?

## 2021-10-23 NOTE — Assessment & Plan Note (Signed)
New diagnosis--likely brought on by the extent of his illness ?EF 30% ?No symptoms now ?Neutral fluid status on furosemide '20mg'$  daily  ?On carvedilol 3.125 bid ?

## 2021-10-23 NOTE — Telephone Encounter (Signed)
Pt is waiting for medication to be called in. A 3rd party called for verification and the # is [phone] ) ? ?budesonide-formoterol  ?

## 2021-10-23 NOTE — Telephone Encounter (Signed)
There is a contraindication for this and tramadol for pain. The alert will pop-up in the refill when signing. ?

## 2021-10-23 NOTE — Progress Notes (Signed)
Rich, To my knowledge fecal transplants are not offered at [facility]. This patient was previously followed by Dr. Ardis Hughs and is now followed by Dr. Chauncey Mann at [facility]. ?

## 2021-10-23 NOTE — Assessment & Plan Note (Signed)
Will recheck labs today .

## 2021-10-23 NOTE — Progress Notes (Signed)
? ?Subjective:  ? ? Patient ID: Leon Estes, male    DOB: 11-04-1930, 88 y.o.   MRN: [id number] ? ?HPI ?Here with wife for hospital follow up  ? ?Admitted with altered mental status  ?Found to have recurrent C dif again (multiple relapses) ?He had been having bad diarrhea again--then confused, etc ?Got fluid resuscitation---then metronidazole/vancomycin ?Was in for a week---came home on 3/8 ?Was seen by Dr Drucilla Schmidt for ID---but not GI ? ?No diarrhea now---2 soft stools a day. No blood ?Eating okay again ? ?Walking with walker ?Wife helps with shower.  ?He dresses and uses bathroom on his own ? ?Found to have CHF ?EF ~30% ?Now on furosemide and carvedilol ?Put on losartan also (had been on lisinopril in the past) ?Episodic SOB--not using symbicort regularly ?No chest pain  ?No edema ?Sleeps flat in bed--no PND ? ?Current Outpatient Medications on File Prior to Visit  ?Medication Sig Dispense Refill  ? acetaminophen (TYLENOL) 500 MG tablet Take 1,000 mg by mouth every 6 (six) hours as needed for moderate pain or headache.    ? aspirin 81 MG EC tablet Take 1 tablet (81 mg total) by mouth daily. Swallow whole. 30 tablet 0  ? budesonide-formoterol (SYMBICORT) 160-4.5 MCG/ACT inhaler INHALE 2 PUFFS INTO THE LUNGS TWICE DAILY (Patient taking differently: 2 puffs in the morning and at bedtime.) 10.2 g 5  ? carvedilol (COREG) 3.125 MG tablet Take 1 tablet (3.125 mg total) by mouth 2 (two) times daily with a meal. 60 tablet 0  ? furosemide (LASIX) 20 MG tablet Take 1 tablet (20 mg total) by mouth daily. 30 tablet 0  ? gabapentin (NEURONTIN) 400 MG capsule Take 1 capsule (400 mg total) by mouth at bedtime. 30 capsule 1  ? losartan (COZAAR) 25 MG tablet Take 0.5 tablets (12.5 mg total) by mouth daily. 15 tablet 0  ? Multiple Vitamin (MULTIVITAMIN WITH MINERALS) TABS tablet Take 1 tablet by mouth daily.    ? Omega-3 Fatty Acids (FISH OIL) 1000 MG CAPS Take 1,000 mg by mouth daily.    ? omeprazole (PRILOSEC) 20 MG capsule Take 1  capsule (20 mg total) by mouth daily. 30 capsule 3  ? potassium chloride (KLOR-CON) 10 MEQ tablet Take 1 tablet (10 mEq total) by mouth daily. 90 tablet 3  ? Probiotic Product (PROBIOTIC DAILY PO) Take 1 capsule by mouth daily.    ? QUEtiapine (SEROQUEL) 25 MG tablet Take 1 tablet (25 mg total) by mouth at bedtime. 30 tablet 11  ? simvastatin (ZOCOR) 40 MG tablet Take 40 mg by mouth at bedtime.    ? tamsulosin (FLOMAX) 0.4 MG CAPS capsule TAKE 1 CAPSULE BY MOUTH ONCE DAILY (Patient taking differently: 0.4 mg daily after supper.) 90 capsule 3  ? traMADol (ULTRAM) 50 MG tablet Take 1 tablet (50 mg total) by mouth daily as needed (pain). 30 tablet 0  ? triamcinolone cream (KENALOG) 0.5 % Apply 1 application topically daily as needed (rash).    ? [START ON 10/30/2021] vancomycin (VANCOCIN) 125 MG capsule Take 1 capsule (125 mg total) by mouth 2 (two) times daily for 7 days, THEN 1 capsule (125 mg total) daily for 7 days, THEN 1 capsule (125 mg total) every other day for 28 days, THEN 1 capsule (125 mg total) every 3 (three) days for 28 days. 44 capsule 0  ? vancomycin (VANCOCIN) 250 MG capsule Take 2 capsules (500 mg total) by mouth 4 (four) times daily for 13 days. 108 capsule 0  ? ?  No current facility-administered medications on file prior to visit.  ? ? ?Allergies  ?Allergen Reactions  ? Benzalkonium Chloride   ?  "Benzalkonium chloride is a quaternary ammonium antiseptic and disinfectant with actions and uses similar to those of other cationic surfactants. It is also used as an antimicrobial preservative for pharmaceutical products."  ? Augmentin [Amoxicillin-Pot Clavulanate] Rash  ? Bacitracin-Polymyxin B Swelling and Rash  ? Neosporin [Neomycin-Bacitracin Zn-Polymyx] Rash  ? Sulfamethoxazole-Trimethoprim Rash  ? Terbinafine And Related Rash  ? ? ?Past Medical History:  ?Diagnosis Date  ? Asthma   ? BPH (benign prostatic hypertrophy)   ? Cataract   ? Chronic renal disease, stage III ([place])   ? Chronic sinusitis   ?  COPD (chronic obstructive pulmonary disease) ([place])   ? Coronary artery disease   ? 2 stents  ? Gall stones   ? Hearing loss   ? DOES NOT WEAR HEARING AIDS  ? High cholesterol   ? Hypertension   ? Lactic acid acidosis 10/12/2021  ? Macular degeneration   ? right eye  ? Septic shock ([place]) 10/10/2021  ? ? ?Past Surgical History:  ?Procedure Laterality Date  ? APPENDECTOMY    ? CORONARY ANGIOPLASTY WITH STENT PLACEMENT  1998  ? 2 stents  ? TRANSURETHRAL RESECTION OF PROSTATE    ? ? ?Family History  ?Problem Relation Age of Onset  ? Diabetes Father   ? Heart disease Brother   ? ? ?Social History  ? ?Socioeconomic History  ? Marital status: Married  ?  Spouse name: Not on file  ? Number of children: 4  ? Years of education: Not on file  ? Highest education level: Not on file  ?Occupational History  ? Occupation: Drove truck and concrete work  ?Tobacco Use  ? Smoking status: Former  ?  Types: Cigarettes  ?  Quit date: 08/13/2005  ?  Years since quitting: 16.2  ?  Passive exposure: Past  ? Smokeless tobacco: Current  ?  Types: Chew  ? Tobacco comments:  ?  discussed stopping this (only once in a while)  ?Vaping Use  ? Vaping Use: Never used  ?Substance and Sexual Activity  ? Alcohol use: No  ? Drug use: No  ? Sexual activity: Not on file  ?Other Topics Concern  ? Not on file  ?Social History Narrative  ? No living will  ? Wife, then [place] daughter should make decisions  ? Would accept resuscitation  ? Would probably accept tube feeds  ? ?Social Determinants of Health  ? ?Financial Resource Strain: Not on file  ?Food Insecurity: Not on file  ?Transportation Needs: Not on file  ?Physical Activity: Not on file  ?Stress: Not on file  ?Social Connections: Not on file  ?Intimate Partner Violence: Not on file  ? ?Review of Systems ?Sleeping okay ?Voids okay ? ?   ?Objective:  ? Physical Exam ?Constitutional:   ?   Appearance: Normal appearance.  ?Cardiovascular:  ?   Rate and Rhythm: Normal rate and regular rhythm.  ?   Heart sounds:  No murmur heard. ?  No gallop.  ?Pulmonary:  ?   Effort: Pulmonary effort is normal.  ?   Breath sounds: Normal breath sounds. No wheezing or rales.  ?Abdominal:  ?   General: Bowel sounds are normal.  ?   Palpations: Abdomen is soft.  ?   Tenderness: There is no abdominal tenderness.  ?Musculoskeletal:  ?   Cervical back: Neck supple.  ?  Right lower leg: No edema.  ?   Left lower leg: No edema.  ?Lymphadenopathy:  ?   Cervical: No cervical adenopathy.  ?Neurological:  ?   Mental Status: He is alert.  ?  ? ? ? ? ?   ?Assessment & Plan:  ? ?

## 2021-10-23 NOTE — Assessment & Plan Note (Signed)
No having trouble with coverage for his symbicort ?Also has taken breo ?

## 2021-10-24 ENCOUNTER — Other Ambulatory Visit: Payer: Self-pay | Admitting: Internal Medicine

## 2021-10-24 DIAGNOSIS — A0471 Enterocolitis due to Clostridium difficile, recurrent: Secondary | ICD-10-CM

## 2021-10-24 LAB — RENAL FUNCTION PANEL
Albumin: 3.5 g/dL (ref 3.5–5.2)
BUN: 14 mg/dL (ref 6–23)
CO2: 26 mEq/L (ref 19–32)
Calcium: 8.9 mg/dL (ref 8.4–10.5)
Chloride: 108 mEq/L (ref 96–112)
Creatinine, Ser: 1.13 mg/dL (ref 0.40–1.50)
GFR: 57.99 mL/min — ABNORMAL LOW (ref >60.00)
Glucose, Bld: 86 mg/dL (ref 70–99)
Phosphorus: 3.1 mg/dL (ref 2.3–4.6)
Potassium: 4.7 mEq/L (ref 3.5–5.1)
Sodium: 139 mEq/L (ref 135–145)

## 2021-10-24 LAB — CBC
HCT: 29.9 % — ABNORMAL LOW (ref 39.0–52.0)
Hemoglobin: 9.6 g/dL — ABNORMAL LOW (ref 13.0–17.0)
MCHC: 31.9 g/dL (ref 30.0–36.0)
MCV: 80.6 fl (ref 78.0–100.0)
Platelets: 357 10*3/uL (ref 150.0–400.0)
RBC: 3.71 Mil/uL — ABNORMAL LOW (ref 4.22–5.81)
RDW: 18.1 % — ABNORMAL HIGH (ref 11.5–15.5)
WBC: 9 10*3/uL (ref 4.0–10.5)

## 2021-10-24 NOTE — Progress Notes (Signed)
Gi

## 2021-10-26 DIAGNOSIS — D692 Other nonthrombocytopenic purpura: Secondary | ICD-10-CM | POA: Diagnosis not present

## 2021-10-26 DIAGNOSIS — A0471 Enterocolitis due to Clostridium difficile, recurrent: Secondary | ICD-10-CM | POA: Diagnosis not present

## 2021-10-26 DIAGNOSIS — K2100 Gastro-esophageal reflux disease with esophagitis, without bleeding: Secondary | ICD-10-CM | POA: Diagnosis not present

## 2021-10-26 DIAGNOSIS — N1831 Chronic kidney disease, stage 3a: Secondary | ICD-10-CM | POA: Diagnosis not present

## 2021-10-26 DIAGNOSIS — Z515 Encounter for palliative care: Secondary | ICD-10-CM | POA: Diagnosis not present

## 2021-10-26 DIAGNOSIS — I129 Hypertensive chronic kidney disease with stage 1 through stage 4 chronic kidney disease, or unspecified chronic kidney disease: Secondary | ICD-10-CM | POA: Diagnosis not present

## 2021-10-26 DIAGNOSIS — J449 Chronic obstructive pulmonary disease, unspecified: Secondary | ICD-10-CM | POA: Diagnosis not present

## 2021-10-26 DIAGNOSIS — Z8719 Personal history of other diseases of the digestive system: Secondary | ICD-10-CM | POA: Diagnosis not present

## 2021-10-26 DIAGNOSIS — Z792 Long term (current) use of antibiotics: Secondary | ICD-10-CM | POA: Diagnosis not present

## 2021-10-30 ENCOUNTER — Ambulatory Visit: Payer: PPO | Admitting: Infectious Disease

## 2021-10-30 ENCOUNTER — Other Ambulatory Visit: Payer: Self-pay

## 2021-10-30 VITALS — BP 116/70 | HR 83 | Temp 98.1°F | Resp 16 | Ht 67.0 in | Wt 140.0 lb

## 2021-10-30 DIAGNOSIS — I10 Essential (primary) hypertension: Secondary | ICD-10-CM

## 2021-10-30 DIAGNOSIS — A0472 Enterocolitis due to Clostridium difficile, not specified as recurrent: Secondary | ICD-10-CM | POA: Diagnosis not present

## 2021-10-30 DIAGNOSIS — A0471 Enterocolitis due to Clostridium difficile, recurrent: Secondary | ICD-10-CM | POA: Diagnosis not present

## 2021-10-30 NOTE — Progress Notes (Signed)
Subjective:  Chief complaint follow-up for [name]   Patient ID: Leon Estes, male    DOB: 12/06/1930, 88 y.o.   MRN: [id number]  HPI  Leon Estes is an 88 year old [place] man with COPD, CAD had recurrent [name] according to Dr. Alphonsus Sias nearly 5 or 6 times.  The patient his wife tell me that on 1 occasion he spent a month in the hospital and there was concern that he might not survive.  This last admission he also was quite sick with septic type physiology.  Fortunately improved.  When I saw him in the hospital he was doing much better and on the verge of being discharged out of the hospital in the last day that I saw him.  I recommended for now doing a vancomycin pulse and taper regimen.  I had recommended by vancomycin 500 mg 4 times daily x14 days and then  Vancomycin 125 mg twice daily x7 days  Then  Vancomycin 125 mg daily x7 days  Vancomycin 125 mg every other days x28 days  Vancomycin 125 mg every 3 days x 28 days  Dr. Alphonsus Sias would like to get him a fecal transplant which I think is quite reasonable.  Unfortunately no and doing it here locally.  The patient and his wife seem skeptical about their ability to "get to [facility] for this."  In the interim I can only see protracted vancomycin as we are doing followed by him having vancomycin at home at all times to take if he has any evidence of [name].  Certainly if he is on antibiotics for any indication it may be prudent to have him on vancomycin prophylactically to prevent [name].     Past Medical History:  Diagnosis Date   Asthma    BPH (benign prostatic hypertrophy)    Cataract    Chronic renal disease, stage III ([facility])    Chronic sinusitis    COPD (chronic obstructive pulmonary disease) ([facility])    Coronary artery disease    2 stents   Gall stones    Hearing loss    DOES NOT WEAR HEARING AIDS   High cholesterol    Hypertension    Lactic acid acidosis  10/12/2021   Macular degeneration    right eye   Septic shock ([facility]) 10/10/2021    Past Surgical History:  Procedure Laterality Date   APPENDECTOMY     CORONARY ANGIOPLASTY WITH STENT PLACEMENT  1998   2 stents   TRANSURETHRAL RESECTION OF PROSTATE      Family History  Problem Relation Age of Onset   Diabetes Father    Heart disease Brother       Social History   Socioeconomic History   Marital status: Married    Spouse name: Not on file   Number of children: 4   Years of education: Not on file   Highest education level: Not on file  Occupational History   Occupation: Drove truck and concrete work  Tobacco Use   Smoking status: Former    Types: Cigarettes    Quit date: 08/13/2005    Years since quitting: 16.2    Passive exposure: Past   Smokeless tobacco: Current    Types: Chew   Tobacco comments:    discussed stopping this (only once in a while)  Vaping Use   Vaping Use: Never used  Substance and Sexual Activity   Alcohol use: No   Drug use: No  Sexual activity: Not on file  Other Topics Concern   Not on file  Social History Narrative   No living will   Wife, then [place] daughter should make decisions   Would accept resuscitation   Would probably accept tube feeds   Social Determinants of Health   Financial Resource Strain: Not on file  Food Insecurity: Not on file  Transportation Needs: Not on file  Physical Activity: Not on file  Stress: Not on file  Social Connections: Not on file    Allergies  Allergen Reactions   Benzalkonium Chloride     "Benzalkonium chloride is a quaternary ammonium antiseptic and disinfectant with actions and uses similar to those of other cationic surfactants. It is also used as an antimicrobial preservative for pharmaceutical products."   Augmentin [Amoxicillin-Pot Clavulanate] Rash   Bacitracin-Polymyxin B Swelling and Rash   Neosporin [Neomycin-Bacitracin Zn-Polymyx] Rash   Sulfamethoxazole-Trimethoprim Rash    Terbinafine And Related Rash     Current Outpatient Medications:    acetaminophen (TYLENOL) 500 MG tablet, Take 1,000 mg by mouth every 6 (six) hours as needed for moderate pain or headache., Disp: , Rfl:    aspirin 81 MG EC tablet, Take 1 tablet (81 mg total) by mouth daily. Swallow whole., Disp: 30 tablet, Rfl: 0   budesonide-formoterol (SYMBICORT) 160-4.5 MCG/ACT inhaler, Inhale 2 puffs into the lungs 2 (two) times daily., Disp: 10.2 g, Rfl: 5   carvedilol (COREG) 3.125 MG tablet, Take 1 tablet (3.125 mg total) by mouth 2 (two) times daily with a meal., Disp: 60 tablet, Rfl: 0   furosemide (LASIX) 20 MG tablet, Take 1 tablet (20 mg total) by mouth daily., Disp: 30 tablet, Rfl: 0   gabapentin (NEURONTIN) 400 MG capsule, Take 1 capsule (400 mg total) by mouth at bedtime., Disp: 30 capsule, Rfl: 1   losartan (COZAAR) 25 MG tablet, Take 0.5 tablets (12.5 mg total) by mouth daily., Disp: 15 tablet, Rfl: 0   Multiple Vitamin (MULTIVITAMIN WITH MINERALS) TABS tablet, Take 1 tablet by mouth daily., Disp: , Rfl:    Omega-3 Fatty Acids (FISH OIL) 1000 MG CAPS, Take 1,000 mg by mouth daily., Disp: , Rfl:    omeprazole (PRILOSEC) 20 MG capsule, Take 1 capsule (20 mg total) by mouth daily., Disp: 30 capsule, Rfl: 3   potassium chloride (KLOR-CON) 10 MEQ tablet, Take 1 tablet (10 mEq total) by mouth daily., Disp: 90 tablet, Rfl: 3   Probiotic Product (PROBIOTIC DAILY PO), Take 1 capsule by mouth daily., Disp: , Rfl:    QUEtiapine (SEROQUEL) 25 MG tablet, Take 1 tablet (25 mg total) by mouth at bedtime., Disp: 30 tablet, Rfl: 11   simvastatin (ZOCOR) 40 MG tablet, Take 1 tablet (40 mg total) by mouth at bedtime., Disp: 90 tablet, Rfl: 3   tamsulosin (FLOMAX) 0.4 MG CAPS capsule, TAKE 1 CAPSULE BY MOUTH ONCE DAILY (Patient taking differently: 0.4 mg daily after supper.), Disp: 90 capsule, Rfl: 3   traMADol (ULTRAM) 50 MG tablet, Take 1 tablet (50 mg total) by mouth daily as needed (pain)., Disp: 30 tablet,  Rfl: 0   triamcinolone cream (KENALOG) 0.5 %, Apply 1 application topically daily as needed (rash)., Disp: , Rfl:    vancomycin (VANCOCIN) 125 MG capsule, Take 1 capsule (125 mg total) by mouth 2 (two) times daily for 7 days, THEN 1 capsule (125 mg total) daily for 7 days, THEN 1 capsule (125 mg total) every other day for 28 days, THEN 1 capsule (125 mg total) every  3 (three) days for 28 days., Disp: 44 capsule, Rfl: 0   vancomycin (VANCOCIN) 250 MG capsule, Take 2 capsules (500 mg total) by mouth 4 (four) times daily for 13 days. (Patient not taking: Reported on 10/30/2021), Disp: 108 capsule, Rfl: 0   Review of Systems  Constitutional:  Negative for activity change, appetite change, chills, diaphoresis, fatigue, fever and unexpected weight change.  HENT:  Negative for congestion, rhinorrhea, sinus pressure, sneezing, sore throat and trouble swallowing.   Eyes:  Negative for photophobia and visual disturbance.  Respiratory:  Negative for cough, chest tightness, shortness of breath, wheezing and stridor.   Cardiovascular:  Negative for chest pain, palpitations and leg swelling.  Gastrointestinal:  Negative for abdominal distention, abdominal pain, anal bleeding, blood in stool, constipation, diarrhea, nausea and vomiting.  Genitourinary:  Negative for difficulty urinating, dysuria, flank pain and hematuria.  Musculoskeletal:  Negative for arthralgias, back pain, gait problem, joint swelling and myalgias.  Skin:  Negative for color change, pallor, rash and wound.  Neurological:  Negative for dizziness, tremors, weakness and light-headedness.  Hematological:  Negative for adenopathy. Does not bruise/bleed easily.  Psychiatric/Behavioral:  Negative for agitation, behavioral problems, confusion, decreased concentration, dysphoric mood and sleep disturbance.       Objective:   Physical Exam Constitutional:      Appearance: He is well-developed.  HENT:     Head: Normocephalic and atraumatic.   Eyes:     Conjunctiva/sclera: Conjunctivae normal.  Cardiovascular:     Rate and Rhythm: Normal rate and regular rhythm.  Pulmonary:     Effort: Pulmonary effort is normal. No respiratory distress.     Breath sounds: No wheezing.  Abdominal:     General: There is no distension.     Palpations: Abdomen is soft.  Musculoskeletal:        General: No tenderness. Normal range of motion.     Cervical back: Normal range of motion and neck supple.  Skin:    General: Skin is warm and dry.     Coloration: Skin is not pale.     Findings: No erythema or rash.  Neurological:     General: No focal deficit present.     Mental Status: He is alert and oriented to person, place, and time.  Psychiatric:        Mood and Affect: Mood normal.        Behavior: Behavior normal.        Thought Content: Thought content normal.        Judgment: Judgment normal.          Assessment & Plan:   Recurrent [name]:  Continue current vancomycin pulse and taper.  I want to have vancomycin with him at all times so that he can initiate it for signs of infection.   If he has not had Zimplava infusion we will work on procuring this for him  I agree with fecal transplant if this can be accomplished at [facility]  Avoid antibiotics at all costs in particular high risk antibiotic such as fluoroquinolones third-generation cephalosporins clindamycin.    Avoid proton pump inhibitors.  HTN: Bp well controlled  Vitals:   10/30/21 1553  BP: 116/70  Pulse: 83  Resp: 16  Temp: 98.1 F (36.7 C)  SpO2: 97%     We spent 41 minutes with the patient including than 50% of the time in face to face counseling of the patient and his wife regarding the nature of [name]  and how we are treated along with p with review of medical records in preparation for the visit and during the visit and in coordination of his care.

## 2021-10-31 ENCOUNTER — Telehealth: Payer: Self-pay

## 2021-10-31 NOTE — Telephone Encounter (Signed)
I spoke with short stay and scheduled patient for Zinplava infusion on 11/06/21 '@9'$  am. Orders have also been faxed to short stay. I have also spoke to patient's wife and relayed appointment information to her. Patient's wife verbalized understanding.  ?Chrishaun Sasso T Jackquelyn Sundberg ? ?

## 2021-11-01 ENCOUNTER — Ambulatory Visit: Payer: PPO | Admitting: Physician Assistant

## 2021-11-01 DIAGNOSIS — E782 Mixed hyperlipidemia: Secondary | ICD-10-CM

## 2021-11-01 DIAGNOSIS — N183 Chronic kidney disease, stage 3 unspecified: Secondary | ICD-10-CM

## 2021-11-01 DIAGNOSIS — I5020 Unspecified systolic (congestive) heart failure: Secondary | ICD-10-CM

## 2021-11-01 DIAGNOSIS — I10 Essential (primary) hypertension: Secondary | ICD-10-CM

## 2021-11-01 DIAGNOSIS — I480 Paroxysmal atrial fibrillation: Secondary | ICD-10-CM

## 2021-11-01 DIAGNOSIS — I2510 Atherosclerotic heart disease of native coronary artery without angina pectoris: Secondary | ICD-10-CM

## 2021-11-02 NOTE — Progress Notes (Signed)
? ?Cardiology Office Note   ? ?Date:  11/07/2021  ? ?ID:  Leon Estes, DOB 1931-04-01, MRN [id number] ? ? ?PCP:  Venia Carbon, MD ?  ?[place]  ?Cardiologist:  Mertie Moores, MD   ?Advanced Practice Provider:  No care team member to display ?Electrophysiologist:  None  ? ?[id number]}  ? ?Chief Complaint  ?Patient presents with  ? Follow-up  ? ? ?History of Present Illness:  ?Leon Estes is a 88 y.o. male with a history of CAD s/p stenting to RCA in 1997, paroxysmal atrial fibrillation not on anticoagulation due to chronic anemia and high fall risk, hypertension, hyperlipidemia, CKD stage III, chronic anemia, COPD, BPH, and recurrent C. Diff colitis  ? ?Patient discharged 10/17/21 after admission for sepsis secondary to recurring C. Diff colitis. Cardiology saw him for new systolic CHF EF 47% on echo global HK mod LVH & RV enlarged with mod decrease sys function. He had received IV resuscitation on admission. Diuresed 8.6L . Carvedilol and cozaar added. Reduced EF thought secondary to sepsis but could be CAD. Given advanced age, possible underlying dementia, and other comorbidities, he is not a good cardiac cath candidate. Would continue to treat medically.  plan for repeat echo as outpatient  to reassess EF function after starting GDMT.  ? ?Patient comes in with his wife.  Ran out of losartan 2 days ago. Denies chest pain, dyspnea, edema. Supposed to start Zinplava infusion for Cdiff. Also considering fecal implant at [facility]. Getting some canned soups/season packets. ? ? ? ?Past Medical History:  ?Diagnosis Date  ? Asthma   ? BPH (benign prostatic hypertrophy)   ? Cataract   ? Chronic renal disease, stage III ([place])   ? Chronic sinusitis   ? COPD (chronic obstructive pulmonary disease) ([place])   ? Coronary artery disease   ? 2 stents  ? Gall stones   ? Hearing loss   ? DOES NOT WEAR HEARING AIDS  ? High cholesterol   ? Hypertension   ? Lactic acid acidosis 10/12/2021  ? Macular  degeneration   ? right eye  ? Septic shock ([place]) 10/10/2021  ? ? ?Past Surgical History:  ?Procedure Laterality Date  ? APPENDECTOMY    ? CORONARY ANGIOPLASTY WITH STENT PLACEMENT  1998  ? 2 stents  ? TRANSURETHRAL RESECTION OF PROSTATE    ? ? ?Current Medications: ?Current Meds  ?Medication Sig  ? acetaminophen (TYLENOL) 500 MG tablet Take 1,000 mg by mouth every 6 (six) hours as needed for moderate pain or headache.  ? aspirin 81 MG EC tablet Take 1 tablet (81 mg total) by mouth daily. Swallow whole.  ? budesonide-formoterol (SYMBICORT) 160-4.5 MCG/ACT inhaler Inhale 2 puffs into the lungs 2 (two) times daily.  ? gabapentin (NEURONTIN) 400 MG capsule Take 1 capsule (400 mg total) by mouth at bedtime.  ? Multiple Vitamin (MULTIVITAMIN WITH MINERALS) TABS tablet Take 1 tablet by mouth daily.  ? Omega-3 Fatty Acids (FISH OIL) 1000 MG CAPS Take 1,000 mg by mouth daily.  ? omeprazole (PRILOSEC) 20 MG capsule Take 1 capsule (20 mg total) by mouth daily.  ? Probiotic Product (PROBIOTIC DAILY PO) Take 1 capsule by mouth daily.  ? QUEtiapine (SEROQUEL) 25 MG tablet Take 1 tablet (25 mg total) by mouth at bedtime.  ? simvastatin (ZOCOR) 40 MG tablet Take 1 tablet (40 mg total) by mouth at bedtime.  ? tamsulosin (FLOMAX) 0.4 MG CAPS capsule TAKE 1 CAPSULE BY MOUTH ONCE DAILY (Patient  taking differently: 0.4 mg daily after supper.)  ? traMADol (ULTRAM) 50 MG tablet Take 1 tablet (50 mg total) by mouth daily as needed (pain).  ? triamcinolone cream (KENALOG) 0.5 % Apply 1 application topically daily as needed (rash).  ? vancomycin (VANCOCIN) 125 MG capsule Take 1 capsule (125 mg total) by mouth 2 (two) times daily for 7 days, THEN 1 capsule (125 mg total) daily for 7 days, THEN 1 capsule (125 mg total) every other day for 28 days, THEN 1 capsule (125 mg total) every 3 (three) days for 28 days.  ? [DISCONTINUED] carvedilol (COREG) 3.125 MG tablet Take 1 tablet (3.125 mg total) by mouth 2 (two) times daily with a meal.  ?  [DISCONTINUED] furosemide (LASIX) 20 MG tablet Take 1 tablet (20 mg total) by mouth daily.  ? [DISCONTINUED] losartan (COZAAR) 25 MG tablet Take 0.5 tablets (12.5 mg total) by mouth daily.  ? [DISCONTINUED] potassium chloride (KLOR-CON) 10 MEQ tablet Take 1 tablet (10 mEq total) by mouth daily.  ?  ? ?Allergies:   Augmentin [amoxicillin-pot clavulanate], Bacitracin-polymyxin b, Benzalkonium chloride, Neosporin [neomycin-bacitracin zn-polymyx], Sulfamethoxazole-trimethoprim, and Terbinafine and related  ? ?Social History  ? ?Socioeconomic History  ? Marital status: Married  ?  Spouse name: Not on file  ? Number of children: 4  ? Years of education: Not on file  ? Highest education level: Not on file  ?Occupational History  ? Occupation: Drove truck and concrete work  ?Tobacco Use  ? Smoking status: Former  ?  Types: Cigarettes  ?  Quit date: 08/13/2005  ?  Years since quitting: 16.2  ?  Passive exposure: Past  ? Smokeless tobacco: Current  ?  Types: Chew  ? Tobacco comments:  ?  discussed stopping this (only once in a while)  ?Vaping Use  ? Vaping Use: Never used  ?Substance and Sexual Activity  ? Alcohol use: No  ? Drug use: No  ? Sexual activity: Not on file  ?Other Topics Concern  ? Not on file  ?Social History Narrative  ? No living will  ? Wife, then [place] daughter should make decisions  ? Would accept resuscitation  ? Would probably accept tube feeds  ? ?Social Determinants of Health  ? ?Financial Resource Strain: Not on file  ?Food Insecurity: Not on file  ?Transportation Needs: Not on file  ?Physical Activity: Not on file  ?Stress: Not on file  ?Social Connections: Not on file  ?  ? ?Family History:  The patient's  family history includes Diabetes in his father; Heart disease in his brother.  ? ?ROS:   ?Please see the history of present illness.    ?ROS All other systems reviewed and are negative. ? ? ?PHYSICAL EXAM:   ?VS:  BP 122/62   Pulse 75   Ht '5\' 7"'$  (1.702 m)   Wt 136 lb (61.7 kg)   SpO2 98%   BMI  21.30 kg/m?   ?Physical Exam  ?GEN: Thin, in no acute distress  ?Neck: no JVD, carotid bruits, or masses ?Cardiac:RRR; no murmurs, rubs, or gallops  ?Respiratory:  clear to auscultation bilaterally, normal work of breathing ?GI: soft, nontender, nondistended, + BS ?Ext: without cyanosis, clubbing, or edema, Good distal pulses bilaterally ?Neuro:  Alert and Oriented x 3 ?Psych: euthymic mood, full affect ? ?Wt Readings from Last 3 Encounters:  ?11/07/21 136 lb (61.7 kg)  ?10/30/21 140 lb (63.5 kg)  ?10/23/21 140 lb (63.5 kg)  ?  ? ? ?Studies/Labs Reviewed:  ? ?  EKG:  EKG is not ordered today.   ? ?Recent Labs: ?12/27/2020: B Natriuretic Peptide 689.3 ?04/18/2021: TSH 1.050 ?10/15/2021: ALT 71 ?10/17/2021: Magnesium 1.6 ?10/23/2021: BUN 14; Creatinine, Ser 1.13; Hemoglobin 9.6; Platelets 357.0; Potassium 4.7; Sodium 139  ? ?Lipid Panel ?   ?Component Value Date/Time  ? TRIG 107 01/09/2021 0400  ? ? ?Additional studies/ records that were reviewed today include:  ?Echo 10/10/21: ? 1. Left ventricular ejection fraction, by estimation, is 30%. The left  ?ventricle has moderately decreased function. The left ventricle  ?demonstrates global hypokinesis. There is moderate left ventricular  ?hypertrophy. Left ventricular diastolic parameters  ? are indeterminate.  ? 2. Right ventricular systolic function is moderately reduced. The right  ?ventricular size is moderately enlarged. There is normal pulmonary artery  ?systolic pressure.  ? 3. Left atrial size was moderately dilated.  ? 4. Right atrial size was severely dilated.  ? 5. The mitral valve is normal in structure. No evidence of mitral valve  ?regurgitation. No evidence of mitral stenosis.  ? 6. The aortic valve is tricuspid. Aortic valve regurgitation is not  ?visualized. Aortic valve sclerosis is present, with no evidence of aortic  ?valve stenosis.  ? 7. The inferior vena cava is dilated in size with <50% respiratory  ?variability, suggesting right atrial pressure of 15 mmHg.   ?  ? ? ?Risk Assessment/Calculations:   ?  ? ?CHA2DS2/VAS Stroke Risk Points  Current as of 5 hours ago  ?   5 >= 2 Points: High Risk  ?1 - 1.99 Points: Medium Risk  ?0 Points: Low Risk  ?  Last Change:    ?  ? Details   ?

## 2021-11-06 ENCOUNTER — Inpatient Hospital Stay (HOSPITAL_COMMUNITY): Admission: RE | Admit: 2021-11-06 | Payer: PPO | Source: Ambulatory Visit

## 2021-11-07 ENCOUNTER — Ambulatory Visit: Payer: PPO | Admitting: Physician Assistant

## 2021-11-07 ENCOUNTER — Other Ambulatory Visit: Payer: Self-pay

## 2021-11-07 ENCOUNTER — Encounter: Payer: Self-pay | Admitting: Physician Assistant

## 2021-11-07 VITALS — BP 122/62 | HR 75 | Ht 67.0 in | Wt 136.0 lb

## 2021-11-07 DIAGNOSIS — I2510 Atherosclerotic heart disease of native coronary artery without angina pectoris: Secondary | ICD-10-CM

## 2021-11-07 DIAGNOSIS — I5022 Chronic systolic (congestive) heart failure: Secondary | ICD-10-CM

## 2021-11-07 DIAGNOSIS — E785 Hyperlipidemia, unspecified: Secondary | ICD-10-CM

## 2021-11-07 DIAGNOSIS — I10 Essential (primary) hypertension: Secondary | ICD-10-CM | POA: Diagnosis not present

## 2021-11-07 DIAGNOSIS — I480 Paroxysmal atrial fibrillation: Secondary | ICD-10-CM

## 2021-11-07 MED ORDER — POTASSIUM CHLORIDE ER 10 MEQ PO TBCR: 10.0000 meq | EXTENDED_RELEASE_TABLET | Freq: Every day | ORAL | 3 refills | Status: DC

## 2021-11-07 MED ORDER — CARVEDILOL 3.125 MG PO TABS
3.1250 mg | ORAL_TABLET | Freq: Two times a day (BID) | ORAL | 3 refills | Status: DC
Start: 2021-11-07 — End: 2022-12-19

## 2021-11-07 MED ORDER — LOSARTAN POTASSIUM 25 MG PO TABS: 12.5000 mg | ORAL_TABLET | Freq: Every day | ORAL | 3 refills | Status: DC

## 2021-11-07 MED ORDER — FUROSEMIDE 20 MG PO TABS: 20.0000 mg | ORAL_TABLET | Freq: Every day | ORAL | 3 refills | Status: DC

## 2021-11-07 NOTE — Patient Instructions (Addendum)
Medication Instructions:  ?Your physician recommends that you continue on your current medications as directed. Please refer to the Current Medication list given to you today. ? ?*If you need a refill on your cardiac medications before your next appointment, please call your pharmacy* ? ? ?Lab Work: ?None ?If you have labs (blood work) drawn today and your tests are completely normal, you will receive your results only by: ?MyChart Message (if you have MyChart) OR ?A paper copy in the mail ?If you have any lab test that is abnormal or we need to change your treatment, we will call you to review the results. ? ? ?Testing/Procedures: ?Your physician has requested that you have an echocardiogram in 4-6 weeks. Echocardiography is a painless test that uses sound waves to create images of your heart. It provides your doctor with information about the size and shape of your heart and how well your heart?s chambers and valves are working. This procedure takes approximately one hour. There are no restrictions for this procedure. ? ? ?Follow-Up: ?At [facility], you and your health needs are our priority.  As part of our continuing mission to provide you with exceptional heart care, we have created designated Provider Care Teams.  These Care Teams include your primary Cardiologist (physician) and Advanced Practice Providers (APPs -  Physician Assistants and Nurse Practitioners) who all work together to provide you with the care you need, when you need it. ? ?We recommend signing up for the patient portal called "MyChart".  Sign up information is provided on this After Visit Summary.  MyChart is used to connect with patients for Virtual Visits (Telemedicine).  Patients are able to view lab/test results, encounter notes, upcoming appointments, etc.  Non-urgent messages can be sent to your provider as well.   ?To learn more about what you can do with MyChart, go to [email or web address].   ? ?Your next appointment:   ?After  Echocardiogram ? ?The format for your next appointment:   ?In Person ? ?Provider:   ?Mertie Moores, MD or Ermalinda Barrios, PA ? ?Two Gram Sodium Diet 2000 mg ? ?What is Sodium? Sodium is a mineral found naturally in many foods. The most significant source of sodium in the diet is table salt, which is about 40% sodium.  Processed, convenience, and preserved foods also contain a large amount of sodium.  The body needs only 500 mg of sodium daily to function,  A normal diet provides more than enough sodium even if you do not use salt. ? ?Why Limit Sodium? A build up of sodium in the body can cause thirst, increased blood pressure, shortness of breath, and water retention.  Decreasing sodium in the diet can reduce edema and risk of heart attack or stroke associated with high blood pressure.  Keep in mind that there are many other factors involved in these health problems.  Heredity, obesity, lack of exercise, cigarette smoking, stress and what you eat all play a role. ? ?General Guidelines: ?Do not add salt at the table or in cooking.  One teaspoon of salt contains over 2 grams of sodium. ?Read food labels ?Avoid processed and convenience foods ?Ask your dietitian before eating any foods not dicussed in the menu planning guidelines ?Consult your physician if you wish to use a salt substitute or a sodium containing medication such as antacids.  Limit milk and milk products to 16 oz (2 cups) per day. ? ?Shopping Hints: ?READ LABELS!! "Dietetic" does not necessarily mean low sodium. ?Salt and other  sodium ingredients are often added to foods during processing. ? ? ? ?Menu Planning Guidelines ?Food Group Choose More Often Avoid  ?Beverages (see also the milk group All fruit juices, low-sodium, salt-free vegetables juices, low-sodium carbonated beverages Regular vegetable or tomato juices, commercially softened water used for drinking or cooking  ?Breads and Cereals Enriched white, wheat, rye and pumpernickel bread, hard rolls  and dinner rolls; muffins, cornbread and waffles; most dry cereals, cooked cereal without added salt; unsalted crackers and breadsticks; low sodium or homemade bread crumbs Bread, rolls and crackers with salted tops; quick breads; instant hot cereals; pancakes; commercial bread stuffing; self-rising flower and biscuit mixes; regular bread crumbs or cracker crumbs  ?Desserts and Sweets Desserts and sweets mad with mild should be within allowance Instant pudding mixes and cake mixes  ?Fats Butter or margarine; vegetable oils; unsalted salad dressings, regular salad dressings limited to 1 Tbs; light, sour and heavy cream Regular salad dressings containing bacon fat, bacon bits, and salt pork; snack dips made with instant soup mixes or processed cheese; salted nuts  ?Fruits Most fresh, frozen and canned fruits Fruits processed with salt or sodium-containing ingredient (some dried fruits are processed with sodium sulfites  ? ? ? ? ? ? ?Vegetables Fresh, frozen vegetables and low- sodium canned vegetables Regular canned vegetables, sauerkraut, pickled vegetables, and others prepared in brine; frozen vegetables in sauces; vegetables seasoned with ham, bacon or salt pork  ?Condiments, Sauces, Miscellaneous ? Salt substitute with physician's approval; pepper, herbs, spices; vinegar, lemon or lime juice; hot pepper sauce; garlic powder, onion powder, low sodium soy sauce (1 Tbs.); low sodium condiments (ketchup, chili sauce, mustard) in limited amounts (1 tsp.) fresh ground horseradish; unsalted tortilla chips, pretzels, potato chips, popcorn, salsa (1/4 cup) Any seasoning made with salt including garlic salt, celery salt, onion salt, and seasoned salt; sea salt, rock salt, kosher salt; meat tenderizers; monosodium glutamate; mustard, regular soy sauce, barbecue, sauce, chili sauce, teriyaki sauce, steak sauce, Worcestershire sauce, and most flavored vinegars; canned gravy and mixes; regular condiments; salted snack foods,  olives, picles, relish, horseradish sauce, catsup  ? ?Food preparation: Try these seasonings ?Meats:    ?Pork Sage, onion Serve with applesauce  ?Chicken Poultry seasoning, thyme, parsley Serve with cranberry sauce  ?Lamb Curry powder, rosemary, garlic, thyme Serve with mint sauce or jelly  ?Veal Marjoram, basil Serve with current jelly, cranberry sauce  ?Beef Pepper, bay leaf Serve with dry mustard, unsalted chive butter  ?Fish [place] leaf, dill Serve with unsalted lemon butter, unsalted parsley butter  ?Vegetables:    ?Asparagus Lemon juice   ?Broccoli Lemon juice   ?Carrots Mustard dressing parsley, mint, nutmeg, glazed with unsalted butter and sugar   ?Green beans Marjoram, lemon juice, nutmeg,dill seed   ?Tomatoes Basil, marjoram, onion   ?Spice /blend for "Salt Shaker" 4 tsp ground thyme ?1 tsp ground sage 3 tsp ground rosemary ?4 tsp ground marjoram  ? ?Test your knowledge ?A product that says "Salt Free" may still contain sodium. True or False ?Garlic Powder and Hot Pepper Sauce an be used as alternative seasonings.True or False ?Processed foods have more sodium than fresh foods.  True or False ?Canned Vegetables have less sodium than froze True or False ? ? ?WAYS TO DECREASE YOUR SODIUM INTAKE ?Avoid the use of added salt in cooking and at the table.  Table salt (and other prepared seasonings which contain salt) is probably one of the greatest sources of sodium in the diet.  Unsalted foods can gain  flavor from the sweet, sour, and butter taste sensations of herbs and spices.  Instead of using salt for seasoning, try the following seasonings with the foods listed.  Remember: how you use them to enhance natural food flavors is limited only by your creativity... ?Allspice-Meat, fish, eggs, fruit, peas, red and yellow vegetables ?Almond Extract-Fruit baked goods ?Anise Seed-Sweet breads, fruit, carrots, beets, cottage cheese, cookies (tastes like licorice) ?Basil-Meat, fish, eggs, vegetables, rice, vegetables  salads, soups, sauces ?[place] Leaf-Meat, fish, stews, poultry ?[place]-Salad, vegetables (cucumber-like flavor) ?[place] Seed-Bread, cookies, cottage cheese, meat, vegetables, cheese, rice ?Cardamon-Baked goo

## 2021-11-09 ENCOUNTER — Ambulatory Visit (HOSPITAL_COMMUNITY)
Admission: RE | Admit: 2021-11-09 | Discharge: 2021-11-09 | Disposition: A | Discharge status: Home / Self Care | Payer: PPO | Source: Ambulatory Visit | Attending: Infectious Disease | Admitting: Infectious Disease

## 2021-11-09 DIAGNOSIS — A0471 Enterocolitis due to Clostridium difficile, recurrent: Secondary | ICD-10-CM | POA: Diagnosis not present

## 2021-11-09 MED ORDER — SODIUM CHLORIDE 0.9 % IV SOLN
650.0000 mg | Freq: Once | INTRAVENOUS | Status: AC
  Administered 2021-11-09: 650 mg via INTRAVENOUS
  Filled 2021-11-09: qty 26

## 2021-11-24 DIAGNOSIS — D692 Other nonthrombocytopenic purpura: Secondary | ICD-10-CM | POA: Diagnosis not present

## 2021-11-24 DIAGNOSIS — Z515 Encounter for palliative care: Secondary | ICD-10-CM | POA: Diagnosis not present

## 2021-11-24 DIAGNOSIS — J449 Chronic obstructive pulmonary disease, unspecified: Secondary | ICD-10-CM | POA: Diagnosis not present

## 2021-11-24 DIAGNOSIS — S46912D Strain of unspecified muscle, fascia and tendon at shoulder and upper arm level, left arm, subsequent encounter: Secondary | ICD-10-CM | POA: Diagnosis not present

## 2021-11-24 DIAGNOSIS — I10 Essential (primary) hypertension: Secondary | ICD-10-CM | POA: Diagnosis not present

## 2021-11-28 ENCOUNTER — Other Ambulatory Visit: Payer: Self-pay | Admitting: Family Medicine

## 2021-11-28 NOTE — Telephone Encounter (Signed)
Last office visit 10/23/2021 for hospital follow up.  Last refilled 09/25/2021 for #30 with 1 refill by Dr. Lorelei Pont.  Next Appt: 11/29/2021 with Dr. Silvio Pate for left arm pain. ?

## 2021-11-29 ENCOUNTER — Encounter: Payer: Self-pay | Admitting: Internal Medicine

## 2021-11-29 ENCOUNTER — Ambulatory Visit (INDEPENDENT_AMBULATORY_CARE_PROVIDER_SITE_OTHER): Payer: PPO | Admitting: Internal Medicine

## 2021-11-29 DIAGNOSIS — L280 Lichen simplex chronicus: Secondary | ICD-10-CM | POA: Diagnosis not present

## 2021-11-29 DIAGNOSIS — S29011A Strain of muscle and tendon of front wall of thorax, initial encounter: Secondary | ICD-10-CM | POA: Insufficient documentation

## 2021-11-29 MED ORDER — TRIAMCINOLONE ACETONIDE 0.1 % EX CREA: 1.0000 "application " | TOPICAL_CREAM | Freq: Two times a day (BID) | CUTANEOUS | 1 refills | Status: DC | PRN

## 2021-11-29 NOTE — Progress Notes (Signed)
? ?Subjective:  ? ? Patient ID: Leon Estes, male    DOB: 1931/07/17, 88 y.o.   MRN: [id number] ? ?HPI ?Here with wife due to left arm pain ? ?Having pain in left axilla ?Doesn't remember any injury ?No apparent knot ?Intermittent pain only ?Not limiting ---can lift things, and ROM is okay ?Some swelling over the pectoral insertion ? ?Tylenol does seem to help ? ?Also has red rash on forearms with itching ? ?Current Outpatient Medications on File Prior to Visit  ?Medication Sig Dispense Refill  ? acetaminophen (TYLENOL) 500 MG tablet Take 1,000 mg by mouth every 6 (six) hours as needed for moderate pain or headache.    ? aspirin 81 MG EC tablet Take 1 tablet (81 mg total) by mouth daily. Swallow whole. 30 tablet 0  ? budesonide-formoterol (SYMBICORT) 160-4.5 MCG/ACT inhaler Inhale 2 puffs into the lungs 2 (two) times daily. 10.2 g 5  ? carvedilol (COREG) 3.125 MG tablet Take 1 tablet (3.125 mg total) by mouth 2 (two) times daily with a meal. 180 tablet 3  ? furosemide (LASIX) 20 MG tablet Take 1 tablet (20 mg total) by mouth daily. 90 tablet 3  ? gabapentin (NEURONTIN) 400 MG capsule TAKE 1 CAPSULE BY MOUTH EVERY NIGHT AT BEDTIME 90 capsule 3  ? losartan (COZAAR) 25 MG tablet Take 0.5 tablets (12.5 mg total) by mouth daily. 45 tablet 3  ? Multiple Vitamin (MULTIVITAMIN WITH MINERALS) TABS tablet Take 1 tablet by mouth daily.    ? Omega-3 Fatty Acids (FISH OIL) 1000 MG CAPS Take 1,000 mg by mouth daily.    ? omeprazole (PRILOSEC) 20 MG capsule Take 1 capsule (20 mg total) by mouth daily. 30 capsule 3  ? potassium chloride (KLOR-CON) 10 MEQ tablet Take 1 tablet (10 mEq total) by mouth daily. 90 tablet 3  ? Probiotic Product (PROBIOTIC DAILY PO) Take 1 capsule by mouth daily.    ? QUEtiapine (SEROQUEL) 25 MG tablet Take 1 tablet (25 mg total) by mouth at bedtime. 30 tablet 11  ? simvastatin (ZOCOR) 40 MG tablet Take 1 tablet (40 mg total) by mouth at bedtime. 90 tablet 3  ? tamsulosin (FLOMAX) 0.4 MG CAPS capsule  TAKE 1 CAPSULE BY MOUTH ONCE DAILY (Patient taking differently: 0.4 mg daily after supper.) 90 capsule 3  ? traMADol (ULTRAM) 50 MG tablet Take 1 tablet (50 mg total) by mouth daily as needed (pain). 30 tablet 0  ? triamcinolone cream (KENALOG) 0.5 % Apply 1 application topically daily as needed (rash).    ? vancomycin (VANCOCIN) 125 MG capsule Take 1 capsule (125 mg total) by mouth 2 (two) times daily for 7 days, THEN 1 capsule (125 mg total) daily for 7 days, THEN 1 capsule (125 mg total) every other day for 28 days, THEN 1 capsule (125 mg total) every 3 (three) days for 28 days. 44 capsule 0  ? ?No current facility-administered medications on file prior to visit.  ? ? ?Allergies  ?Allergen Reactions  ? Augmentin [Amoxicillin-Pot Clavulanate] Rash  ? Bacitracin-Polymyxin B Swelling and Rash  ? Benzalkonium Chloride   ?  "Benzalkonium chloride is a quaternary ammonium antiseptic and disinfectant with actions and uses similar to those of other cationic surfactants. It is also used as an antimicrobial preservative for pharmaceutical products."  ? Neosporin [Neomycin-Bacitracin Zn-Polymyx] Rash  ? Sulfamethoxazole-Trimethoprim Rash  ? Terbinafine And Related Rash  ? ? ?Past Medical History:  ?Diagnosis Date  ? Asthma   ? BPH (benign prostatic hypertrophy)   ?  Cataract   ? Chronic renal disease, stage III ([place])   ? Chronic sinusitis   ? COPD (chronic obstructive pulmonary disease) ([place])   ? Coronary artery disease   ? 2 stents  ? Gall stones   ? Hearing loss   ? DOES NOT WEAR HEARING AIDS  ? High cholesterol   ? Hypertension   ? Lactic acid acidosis 10/12/2021  ? Macular degeneration   ? right eye  ? Septic shock ([place]) 10/10/2021  ? ? ?Past Surgical History:  ?Procedure Laterality Date  ? APPENDECTOMY    ? CORONARY ANGIOPLASTY WITH STENT PLACEMENT  1998  ? 2 stents  ? TRANSURETHRAL RESECTION OF PROSTATE    ? ? ?Family History  ?Problem Relation Age of Onset  ? Diabetes Father   ? Heart disease Brother   ? ? ?Social  History  ? ?Socioeconomic History  ? Marital status: Married  ?  Spouse name: Not on file  ? Number of children: 4  ? Years of education: Not on file  ? Highest education level: Not on file  ?Occupational History  ? Occupation: Drove truck and concrete work  ?Tobacco Use  ? Smoking status: Former  ?  Types: Cigarettes  ?  Quit date: 08/13/2005  ?  Years since quitting: 16.3  ?  Passive exposure: Past  ? Smokeless tobacco: Current  ?  Types: Chew  ? Tobacco comments:  ?  discussed stopping this (only once in a while)  ?Vaping Use  ? Vaping Use: Never used  ?Substance and Sexual Activity  ? Alcohol use: No  ? Drug use: No  ? Sexual activity: Not on file  ?Other Topics Concern  ? Not on file  ?Social History Narrative  ? No living will  ? Wife, then [place] daughter should make decisions  ? Would accept resuscitation  ? Would probably accept tube feeds  ? ?Social Determinants of Health  ? ?Financial Resource Strain: Not on file  ?Food Insecurity: Not on file  ?Transportation Needs: Not on file  ?Physical Activity: Not on file  ?Stress: Not on file  ?Social Connections: Not on file  ?Intimate Partner Violence: Not on file  ? ?Review of Systems ?No fever ?Not sick ?Diarrhea is better---got infusion per Dr Drucilla Schmidt ?Still on vanco orally ?   ?Objective:  ? Physical Exam ?Musculoskeletal:  ?   Comments: ROM in left shoulder is pretty good--but with sig crepitus ?No tenderness ?Pain area is distal pectoral muscle  ?Skin: ?   Comments: Redness along extensor forearms  ?  ? ? ? ? ?   ?Assessment & Plan:  ? ?

## 2021-11-29 NOTE — Assessment & Plan Note (Signed)
Reassured ?Not worrisome ?Okay to use tylenol and topical diclofenac  ?

## 2021-11-29 NOTE — Assessment & Plan Note (Signed)
Will give triamcinolone cream for this ?

## 2021-11-29 NOTE — Patient Instructions (Signed)
You can continue tylenol for the pain. You can also use over the counter aspercreme or diclofenac gel (2 grams up to three times a day on the painful area) ?

## 2021-12-04 ENCOUNTER — Ambulatory Visit: Payer: PPO | Admitting: Infectious Disease

## 2021-12-04 ENCOUNTER — Encounter: Payer: Self-pay | Admitting: Infectious Disease

## 2021-12-04 ENCOUNTER — Other Ambulatory Visit: Payer: Self-pay

## 2021-12-04 VITALS — BP 119/64 | HR 77 | Temp 98.2°F | Wt 141.0 lb

## 2021-12-04 DIAGNOSIS — A0472 Enterocolitis due to Clostridium difficile, not specified as recurrent: Secondary | ICD-10-CM | POA: Diagnosis not present

## 2021-12-04 DIAGNOSIS — R21 Rash and other nonspecific skin eruption: Secondary | ICD-10-CM | POA: Diagnosis not present

## 2021-12-04 DIAGNOSIS — A0471 Enterocolitis due to Clostridium difficile, recurrent: Secondary | ICD-10-CM

## 2021-12-04 DIAGNOSIS — I10 Essential (primary) hypertension: Secondary | ICD-10-CM | POA: Diagnosis not present

## 2021-12-04 HISTORY — DX: Rash and other nonspecific skin eruption: R21

## 2021-12-04 NOTE — Progress Notes (Signed)
? ?Subjective:  ?Chief complaint follow-up for [name] ? ? Patient ID: Leon Estes, male    DOB: May 12, 1931, 88 y.o.   MRN: [id number] ? ?HPI ? ?Leon Estes is an 88 year old 75 man with COPD, CAD had recurrent [name] according to Dr. Silvio Pate nearly 5 or 6 times. ? ?The patient his wife tell me that on 1 occasion he spent a month in the hospital and there was concern that he might not survive.  This last admission he also was quite sick with septic type physiology. ? ?Fortunately improved. ? ?When I saw him in the hospital he was doing much better and on the verge of being discharged out of the hospital in the last day that I saw him. ? ?I recommended for now doing a vancomycin pulse and taper regimen. ? ?I had recommended by vancomycin 500 mg 4 times daily x14 days and then ? ?Vancomycin 125 mg twice daily x7 days ? ?Then ? ?Vancomycin 125 mg daily x7 days ? ?Vancomycin 125 mg every other days x28 days ? ?Vancomycin 125 mg every 3 days x 28 days ?  ?Since I last saw him his diarrhea is resolved and some days he is not even having bowel movements. ? ?He did develop a rash on his arms and now his hands that he is curious about. ? ?It is a bit of a hip had a former rash and pruritic in nature. ? ? ? ? ?Past Medical History:  ?Diagnosis Date  ? Asthma   ? BPH (benign prostatic hypertrophy)   ? Cataract   ? Chronic renal disease, stage III ([place])   ? Chronic sinusitis   ? COPD (chronic obstructive pulmonary disease) ([facility])   ? Coronary artery disease   ? 2 stents  ? Gall stones   ? Hearing loss   ? DOES NOT WEAR HEARING AIDS  ? High cholesterol   ? Hypertension   ? Lactic acid acidosis 10/12/2021  ? Macular degeneration   ? right eye  ? Septic shock ([place]) 10/10/2021  ? ? ?Past Surgical History:  ?Procedure Laterality Date  ? APPENDECTOMY    ? CORONARY ANGIOPLASTY WITH STENT PLACEMENT  1998  ? 2 stents  ? TRANSURETHRAL RESECTION OF PROSTATE    ? ? ?Family History  ?Problem Relation Age of Onset   ? Diabetes Father   ? Heart disease Brother   ? ? ?  ?Social History  ? ?Socioeconomic History  ? Marital status: Married  ?  Spouse name: Not on file  ? Number of children: 4  ? Years of education: Not on file  ? Highest education level: Not on file  ?Occupational History  ? Occupation: Drove truck and concrete work  ?Tobacco Use  ? Smoking status: Former  ?  Types: Cigarettes  ?  Quit date: 08/13/2005  ?  Years since quitting: 16.3  ?  Passive exposure: Past  ? Smokeless tobacco: Current  ?  Types: Chew  ? Tobacco comments:  ?  discussed stopping this (only once in a while)  ?Vaping Use  ? Vaping Use: Never used  ?Substance and Sexual Activity  ? Alcohol use: No  ? Drug use: No  ? Sexual activity: Not on file  ?Other Topics Concern  ? Not on file  ?Social History Narrative  ? No living will  ? Wife, then [place] daughter should make decisions  ? Would accept resuscitation  ? Would probably accept tube feeds  ? ?  Social Determinants of Health  ? ?Financial Resource Strain: Not on file  ?Food Insecurity: Not on file  ?Transportation Needs: Not on file  ?Physical Activity: Not on file  ?Stress: Not on file  ?Social Connections: Not on file  ? ? ?Allergies  ?Allergen Reactions  ? Augmentin [Amoxicillin-Pot Clavulanate] Rash  ? Bacitracin-Polymyxin B Swelling and Rash  ? Benzalkonium Chloride   ?  "Benzalkonium chloride is a quaternary ammonium antiseptic and disinfectant with actions and uses similar to those of other cationic surfactants. It is also used as an antimicrobial preservative for pharmaceutical products."  ? Neosporin [Neomycin-Bacitracin Zn-Polymyx] Rash  ? Sulfamethoxazole-Trimethoprim Rash  ? Terbinafine And Related Rash  ? ? ? ?Current Outpatient Medications:  ?  acetaminophen (TYLENOL) 500 MG tablet, Take 1,000 mg by mouth every 6 (six) hours as needed for moderate pain or headache., Disp: , Rfl:  ?  aspirin 81 MG EC tablet, Take 1 tablet (81 mg total) by mouth daily. Swallow whole., Disp: 30 tablet,  Rfl: 0 ?  budesonide-formoterol (SYMBICORT) 160-4.5 MCG/ACT inhaler, Inhale 2 puffs into the lungs 2 (two) times daily., Disp: 10.2 g, Rfl: 5 ?  carvedilol (COREG) 3.125 MG tablet, Take 1 tablet (3.125 mg total) by mouth 2 (two) times daily with a meal., Disp: 180 tablet, Rfl: 3 ?  furosemide (LASIX) 20 MG tablet, Take 1 tablet (20 mg total) by mouth daily., Disp: 90 tablet, Rfl: 3 ?  gabapentin (NEURONTIN) 400 MG capsule, TAKE 1 CAPSULE BY MOUTH EVERY NIGHT AT BEDTIME, Disp: 90 capsule, Rfl: 3 ?  losartan (COZAAR) 25 MG tablet, Take 0.5 tablets (12.5 mg total) by mouth daily., Disp: 45 tablet, Rfl: 3 ?  Multiple Vitamin (MULTIVITAMIN WITH MINERALS) TABS tablet, Take 1 tablet by mouth daily., Disp: , Rfl:  ?  Omega-3 Fatty Acids (FISH OIL) 1000 MG CAPS, Take 1,000 mg by mouth daily., Disp: , Rfl:  ?  omeprazole (PRILOSEC) 20 MG capsule, Take 1 capsule (20 mg total) by mouth daily., Disp: 30 capsule, Rfl: 3 ?  potassium chloride (KLOR-CON) 10 MEQ tablet, Take 1 tablet (10 mEq total) by mouth daily., Disp: 90 tablet, Rfl: 3 ?  Probiotic Product (PROBIOTIC DAILY PO), Take 1 capsule by mouth daily., Disp: , Rfl:  ?  QUEtiapine (SEROQUEL) 25 MG tablet, Take 1 tablet (25 mg total) by mouth at bedtime., Disp: 30 tablet, Rfl: 11 ?  simvastatin (ZOCOR) 40 MG tablet, Take 1 tablet (40 mg total) by mouth at bedtime., Disp: 90 tablet, Rfl: 3 ?  tamsulosin (FLOMAX) 0.4 MG CAPS capsule, TAKE 1 CAPSULE BY MOUTH ONCE DAILY (Patient taking differently: 0.4 mg daily after supper.), Disp: 90 capsule, Rfl: 3 ?  traMADol (ULTRAM) 50 MG tablet, Take 1 tablet (50 mg total) by mouth daily as needed (pain)., Disp: 30 tablet, Rfl: 0 ?  triamcinolone cream (KENALOG) 0.1 %, Apply 1 application. topically 2 (two) times daily as needed., Disp: 45 g, Rfl: 1 ?  vancomycin (VANCOCIN) 125 MG capsule, Take 1 capsule (125 mg total) by mouth 2 (two) times daily for 7 days, THEN 1 capsule (125 mg total) daily for 7 days, THEN 1 capsule (125 mg total)  every other day for 28 days, THEN 1 capsule (125 mg total) every 3 (three) days for 28 days., Disp: 44 capsule, Rfl: 0 ? ? ?Review of Systems  ?Constitutional:  Negative for activity change, appetite change, chills, diaphoresis, fatigue, fever and unexpected weight change.  ?HENT:  Negative for congestion, rhinorrhea, sinus pressure, sneezing, sore  throat and trouble swallowing.   ?Eyes:  Negative for photophobia and visual disturbance.  ?Respiratory:  Negative for cough, chest tightness, shortness of breath, wheezing and stridor.   ?Cardiovascular:  Negative for chest pain, palpitations and leg swelling.  ?Gastrointestinal:  Negative for abdominal distention, abdominal pain, anal bleeding, blood in stool, constipation, diarrhea, nausea and vomiting.  ?Genitourinary:  Negative for difficulty urinating, dysuria, flank pain and hematuria.  ?Musculoskeletal:  Negative for arthralgias, back pain, gait problem, joint swelling and myalgias.  ?Skin:  Positive for rash. Negative for color change, pallor and wound.  ?Neurological:  Negative for dizziness, tremors, weakness and light-headedness.  ?Hematological:  Negative for adenopathy. Does not bruise/bleed easily.  ?Psychiatric/Behavioral:  Negative for agitation, behavioral problems, confusion, decreased concentration, dysphoric mood and sleep disturbance.   ? ?   ?Objective:  ? Physical Exam ?Constitutional:   ?   Appearance: He is well-developed.  ?HENT:  ?   Head: Normocephalic and atraumatic.  ?Eyes:  ?   Conjunctiva/sclera: Conjunctivae normal.  ?Cardiovascular:  ?   Rate and Rhythm: Normal rate and regular rhythm.  ?Pulmonary:  ?   Effort: Pulmonary effort is normal. No respiratory distress.  ?   Breath sounds: No wheezing.  ?Abdominal:  ?   General: There is no distension.  ?   Palpations: Abdomen is soft.  ?Musculoskeletal:     ?   General: No tenderness. Normal range of motion.  ?   Cervical back: Normal range of motion and neck supple.  ?Skin: ?   General: Skin  is warm and dry.  ?   Coloration: Skin is not pale.  ?   Findings: No erythema or rash.  ?Neurological:  ?   General: No focal deficit present.  ?   Mental Status: He is alert and oriented to person, plac

## 2021-12-05 ENCOUNTER — Telehealth: Payer: Self-pay | Admitting: Internal Medicine

## 2021-12-05 NOTE — Telephone Encounter (Signed)
Unable to reach patient ? ?Can you please send letter stating that GI referral was sent to [facility]. I am closing out referral. Patient can call to schedule.  ? ?Referral and OV notes faxed via [facility] March 2023. ?  ?Grafton Folk, MD, Msc ?Gastroenterologist in [place], [place] ?Address: [address], [place] [address] ?Phone: [phone] ? ?Thanks! ?

## 2021-12-06 NOTE — Telephone Encounter (Signed)
Spoke to pt's wife. He went to see ID and is doing well after infusion. They want to hold off on calling [facility]. ?

## 2021-12-14 DIAGNOSIS — H2512 Age-related nuclear cataract, left eye: Secondary | ICD-10-CM | POA: Diagnosis not present

## 2021-12-14 DIAGNOSIS — H353233 Exudative age-related macular degeneration, bilateral, with inactive scar: Secondary | ICD-10-CM | POA: Diagnosis not present

## 2021-12-14 DIAGNOSIS — H52223 Regular astigmatism, bilateral: Secondary | ICD-10-CM | POA: Diagnosis not present

## 2021-12-14 DIAGNOSIS — H1712 Central corneal opacity, left eye: Secondary | ICD-10-CM | POA: Diagnosis not present

## 2021-12-14 DIAGNOSIS — Z9841 Cataract extraction status, right eye: Secondary | ICD-10-CM | POA: Diagnosis not present

## 2021-12-19 ENCOUNTER — Ambulatory Visit (HOSPITAL_COMMUNITY): Payer: PPO | Attending: Cardiology

## 2021-12-19 DIAGNOSIS — I5022 Chronic systolic (congestive) heart failure: Secondary | ICD-10-CM | POA: Diagnosis not present

## 2021-12-19 LAB — ECHOCARDIOGRAM COMPLETE
Area-P 1/2: 2.17 cm2
S' Lateral: 3.4 cm

## 2021-12-27 DIAGNOSIS — H353190 Nonexudative age-related macular degeneration, unspecified eye, stage unspecified: Secondary | ICD-10-CM | POA: Diagnosis not present

## 2022-01-05 DIAGNOSIS — L723 Sebaceous cyst: Secondary | ICD-10-CM | POA: Diagnosis not present

## 2022-01-05 DIAGNOSIS — L300 Nummular dermatitis: Secondary | ICD-10-CM | POA: Diagnosis not present

## 2022-01-09 ENCOUNTER — Encounter: Payer: Self-pay | Admitting: Cardiovascular Disease

## 2022-01-09 ENCOUNTER — Ambulatory Visit: Payer: PPO | Admitting: Cardiovascular Disease

## 2022-01-09 VITALS — BP 120/60 | HR 69 | Ht 67.0 in | Wt 144.2 lb

## 2022-01-09 DIAGNOSIS — I10 Essential (primary) hypertension: Secondary | ICD-10-CM

## 2022-01-09 DIAGNOSIS — J449 Chronic obstructive pulmonary disease, unspecified: Secondary | ICD-10-CM | POA: Diagnosis not present

## 2022-01-09 DIAGNOSIS — I5021 Acute systolic (congestive) heart failure: Secondary | ICD-10-CM

## 2022-01-09 NOTE — Patient Instructions (Signed)
Medication Instructions:  Your physician recommends that you continue on your current medications as directed. Please refer to the Current Medication list given to you today.  *If you need a refill on your cardiac medications before your next appointment, please call your pharmacy*   Lab Work: NONE If you have labs (blood work) drawn today and your tests are completely normal, you will receive your results only by: [place] (if you have MyChart) OR A paper copy in the mail If you have any lab test that is abnormal or we need to change your treatment, we will call you to review the results.   Testing/Procedures: NONE   Follow-Up: At [facility], you and your health needs are our priority.  As part of our continuing mission to provide you with exceptional heart care, we have created designated Provider Care Teams.  These Care Teams include your primary Cardiologist (physician) and Advanced Practice Providers (APPs -  Physician Assistants and Nurse Practitioners) who all work together to provide you with the care you need, when you need it.  We recommend signing up for the patient portal called "MyChart".  Sign up information is provided on this After Visit Summary.  MyChart is used to connect with patients for Virtual Visits (Telemedicine).  Patients are able to view lab/test results, encounter notes, upcoming appointments, etc.  Non-urgent messages can be sent to your provider as well.   To learn more about what you can do with MyChart, go to [email or web address].    Your next appointment:   1 year(s)  The format for your next appointment:   In Person  Provider:   Nahser, Swinyer, Theadora Rama   Important Information About Sugar

## 2022-01-09 NOTE — Progress Notes (Signed)
Cardiology Office Note   Date:  01/09/2022   ID:  Leon Estes Jan 09, 1931, MRN [id number]  PCP:  Leon Carbon, MD  Cardiologist:   Leon Moores, MD / previously saw Leon Furbish, MD at [facility]  No chief complaint on file.  Problem list: 1. Coronary artery disease-status post stenting of the right coronary artery in 1997 2. COPD 3. Hyperlipidemia     Leon Estes is a 88 y.o. male who presents for follow up of his CP He had am MI in 1997.  Had 2 stents placed by Dr. Glade Lloyd. Hs seen Leon Estes in the past, wants to see a cardiologist here in [place] .  Has done well,  No further of CP.  Exercises regularly,  Mows 3 acres.   Goes bowling once a week.  Also has some pain down his leg.  Able to weed-eat without any problems  Sees Dr. Delfina Estes. Was recently found to have mild anemia.  Was started on iron at that time .   January 13, 2016:  Doing well.  Still very active.  Mows his 3 acres and weed-eats .   Bowls on wednesdays   Aug. 28, 2018: Doing well,  No CP or dyspnea.  Still bowling - now bowls around 180 .  Sees Dr. Delfina Estes  - manages his lipids   April 15, 2019: Leon Estes is seen today for follow-up visit. Doing well.   Has not been bowling - closed for COVID No CP or dyspnea  Has some dyspnea due to his COPD .  - takes an inhaler regularly  Has had some leg pain .  Has resolved   April 26, 2020: Leon Estes is seen back today for follow-up of his coronary artery disease.  He has some shortness of breath due to his COPD. Has generalized fatiuged  No angina started on antibiotics.   April 29,2022; Leon Estes is seen today for follow up of his CAD Has chronic dyspnea due to his COPD  Was hospitalized with RUQ pain , WBC 16K Was started on antibiotics. No need for surgery at this point .   There was a comment about PAF. I cannot see any ECGs that show atrial fib  He has ectopic atrial rhythm , PVCs on tele during the hospitalization   September 12, 2021: Leon Estes  is seen today for a follow-up visit.  He has a history of coronary artery disease, COPD. No angina  No real exercise   His Hb is 8 in Dec. While in the hospital  Will DC ASA    Jan 09, 2022  Leon Estes is seen today for follow up of his CAD, COPD  Hx of anemia  No cp or dyspnea   Gets some exercise ,  limited in his walking  Echo from May 9 shows improved LV function with EF 45-50%  Grade I DD   ( previoius EF was 30%)  Severe left atrial enlargement.  Trivial aortic insufficiency.  Mild mitral regurgitation.  Past Medical History:  Diagnosis Date   Asthma    BPH (benign prostatic hypertrophy)    Cataract    Chronic renal disease, stage III ([facility])    Chronic sinusitis    COPD (chronic obstructive pulmonary disease) ([facility])    Coronary artery disease    2 stents   Gall stones    Hearing loss    DOES NOT WEAR HEARING AIDS   High cholesterol    Hypertension    Lactic acid acidosis 10/12/2021  Macular degeneration    right eye   Rash 12/04/2021   Septic shock (Leon Estes) 10/10/2021    Past Surgical History:  Procedure Laterality Date   APPENDECTOMY     CORONARY ANGIOPLASTY WITH STENT PLACEMENT  1998   2 stents   TRANSURETHRAL RESECTION OF PROSTATE       Current Outpatient Medications  Medication Sig Dispense Refill   acetaminophen (TYLENOL) 500 MG tablet Take 1,000 mg by mouth every 6 (six) hours as needed for moderate pain or headache.     aspirin 81 MG EC tablet Take 1 tablet (81 mg total) by mouth daily. Swallow whole. 30 tablet 0   budesonide-formoterol (SYMBICORT) 160-4.5 MCG/ACT inhaler Inhale 2 puffs into the lungs 2 (two) times daily. 10.2 g 5   carvedilol (COREG) 3.125 MG tablet Take 1 tablet (3.125 mg total) by mouth 2 (two) times daily with a meal. 180 tablet 3   furosemide (LASIX) 20 MG tablet Take 1 tablet (20 mg total) by mouth daily. 90 tablet 3   gabapentin (NEURONTIN) 400 MG capsule TAKE 1 CAPSULE BY MOUTH EVERY NIGHT AT BEDTIME 90 capsule 3   losartan (COZAAR)  25 MG tablet Take 0.5 tablets (12.5 mg total) by mouth daily. 45 tablet 3   Multiple Vitamin (MULTIVITAMIN WITH MINERALS) TABS tablet Take 1 tablet by mouth daily.     Omega-3 Fatty Acids (FISH OIL) 1000 MG CAPS Take 1,000 mg by mouth daily.     omeprazole (PRILOSEC) 20 MG capsule Take 1 capsule (20 mg total) by mouth daily. 30 capsule 3   potassium chloride (KLOR-CON) 10 MEQ tablet Take 1 tablet (10 mEq total) by mouth daily. 90 tablet 3   QUEtiapine (SEROQUEL) 25 MG tablet Take 1 tablet (25 mg total) by mouth at bedtime. 30 tablet 11   simvastatin (ZOCOR) 40 MG tablet Take 1 tablet (40 mg total) by mouth at bedtime. 90 tablet 3   tamsulosin (FLOMAX) 0.4 MG CAPS capsule TAKE 1 CAPSULE BY MOUTH ONCE DAILY (Patient taking differently: 0.4 mg daily after supper.) 90 capsule 3   traMADol (ULTRAM) 50 MG tablet Take 1 tablet (50 mg total) by mouth daily as needed (pain). 30 tablet 0   triamcinolone cream (KENALOG) 0.1 % Apply 1 application. topically 2 (two) times daily as needed. 45 g 1   No current facility-administered medications for this visit.    Allergies:   Augmentin [amoxicillin-pot clavulanate], Bacitracin-polymyxin b, Benzalkonium chloride, Neosporin [neomycin-bacitracin zn-polymyx], Sulfamethoxazole-trimethoprim, and Terbinafine and related    Social History:  The patient  reports that he quit smoking about 16 years ago. His smoking use included cigarettes. He has been exposed to tobacco smoke. His smokeless tobacco use includes chew. He reports that he does not drink alcohol and does not use drugs.   Family History:  The patient's family history includes Diabetes in his father; Heart disease in his brother.    ROS:  Please see the history of present illness.  Noted in HPI.  Otherwise review of systems is negative.  Physical Exam: Blood pressure 120/60, pulse 69, height '5\' 7"'$  (1.702 m), weight 144 lb 3.2 oz (65.4 kg), SpO2 99 %.  GEN:  Well nourished, well developed in no acute  distress HEENT: Normal NECK: No JVD; No carotid bruits LYMPHATICS: No lymphadenopathy CARDIAC: RRR , no murmurs, rubs, gallops RESPIRATORY:  Clear to auscultation without rales, wheezing or rhonchi  ABDOMEN: Soft, non-tender, non-distended MUSCULOSKELETAL:  No edema; No deformity  SKIN: Warm and dry NEUROLOGIC:  Alert and  oriented x 3     EKG:        Recent Labs: 04/18/2021: TSH 1.050 10/15/2021: ALT 71 10/17/2021: Magnesium 1.6 10/23/2021: BUN 14; Creatinine, Ser 1.13; Hemoglobin 9.6; Platelets 357.0; Potassium 4.7; Sodium 139    Lipid Panel    Component Value Date/Time   TRIG 107 01/09/2021 0400      Wt Readings from Last 3 Encounters:  01/09/22 144 lb 3.2 oz (65.4 kg)  12/04/21 141 lb (64 kg)  11/29/21 140 lb 3.2 oz (63.6 kg)      Other studies Reviewed: Additional studies/ records that were reviewed today include: . Review of the above records demonstrates:    ASSESSMENT AND PLAN:  1. Coronary artery disease- .    No angina .       2. COPD -   stable   3. Hyperlipidemia -    cont meds.    4.  Acute combined CHF"  :  EF has improved since previous echo . Continue current meds.    Current medicines are reviewed at length with the patient today.  The patient does not have concerns regarding medicines.  The following changes have been made:  no change  Labs/ tests ordered today include:   No orders of the defined types were placed in this encounter.   Disposition:      Leon Moores, MD  01/09/2022 5:42 PM    [facility] [place], [place], [place]  [address] Phone: [phone]; Fax: [phone]

## 2022-01-16 DIAGNOSIS — D692 Other nonthrombocytopenic purpura: Secondary | ICD-10-CM | POA: Diagnosis not present

## 2022-01-16 DIAGNOSIS — Z515 Encounter for palliative care: Secondary | ICD-10-CM | POA: Diagnosis not present

## 2022-01-16 DIAGNOSIS — H353290 Exudative age-related macular degeneration, unspecified eye, stage unspecified: Secondary | ICD-10-CM | POA: Diagnosis not present

## 2022-01-16 DIAGNOSIS — R0989 Other specified symptoms and signs involving the circulatory and respiratory systems: Secondary | ICD-10-CM | POA: Diagnosis not present

## 2022-01-16 DIAGNOSIS — I700 Atherosclerosis of aorta: Secondary | ICD-10-CM | POA: Diagnosis not present

## 2022-01-16 DIAGNOSIS — Z6823 Body mass index (BMI) 23.0-23.9, adult: Secondary | ICD-10-CM | POA: Diagnosis not present

## 2022-01-16 DIAGNOSIS — N1831 Chronic kidney disease, stage 3a: Secondary | ICD-10-CM | POA: Diagnosis not present

## 2022-01-16 DIAGNOSIS — J449 Chronic obstructive pulmonary disease, unspecified: Secondary | ICD-10-CM | POA: Diagnosis not present

## 2022-01-16 DIAGNOSIS — I480 Paroxysmal atrial fibrillation: Secondary | ICD-10-CM | POA: Diagnosis not present

## 2022-01-16 DIAGNOSIS — D3610 Benign neoplasm of peripheral nerves and autonomic nervous system, unspecified: Secondary | ICD-10-CM | POA: Diagnosis not present

## 2022-01-16 DIAGNOSIS — I5022 Chronic systolic (congestive) heart failure: Secondary | ICD-10-CM | POA: Diagnosis not present

## 2022-01-16 DIAGNOSIS — I129 Hypertensive chronic kidney disease with stage 1 through stage 4 chronic kidney disease, or unspecified chronic kidney disease: Secondary | ICD-10-CM | POA: Diagnosis not present

## 2022-01-18 ENCOUNTER — Telehealth: Payer: Self-pay

## 2022-01-18 NOTE — Telephone Encounter (Signed)
Pt has 12:00 OV with Dr. Darnell Level tomorrow.  Lvm asking pt to call back. Need to know if pt had recent home COVID test.

## 2022-01-19 ENCOUNTER — Encounter: Payer: Self-pay | Admitting: Family Medicine

## 2022-01-19 ENCOUNTER — Ambulatory Visit (INDEPENDENT_AMBULATORY_CARE_PROVIDER_SITE_OTHER): Payer: PPO | Admitting: Family Medicine

## 2022-01-19 VITALS — BP 140/71 | HR 69 | Temp 96.2°F | Ht 67.0 in | Wt 141.3 lb

## 2022-01-19 DIAGNOSIS — J449 Chronic obstructive pulmonary disease, unspecified: Secondary | ICD-10-CM | POA: Diagnosis not present

## 2022-01-19 DIAGNOSIS — I10 Essential (primary) hypertension: Secondary | ICD-10-CM | POA: Diagnosis not present

## 2022-01-19 DIAGNOSIS — I2510 Atherosclerotic heart disease of native coronary artery without angina pectoris: Secondary | ICD-10-CM | POA: Diagnosis not present

## 2022-01-19 DIAGNOSIS — U071 COVID-19: Secondary | ICD-10-CM | POA: Insufficient documentation

## 2022-01-19 DIAGNOSIS — I5022 Chronic systolic (congestive) heart failure: Secondary | ICD-10-CM

## 2022-01-19 DIAGNOSIS — N183 Chronic kidney disease, stage 3 unspecified: Secondary | ICD-10-CM | POA: Diagnosis not present

## 2022-01-19 MED ORDER — VITAMIN D 50 MCG (2000 UT) PO CAPS: 1.0000 | ORAL_CAPSULE | Freq: Every day | ORAL | Status: DC

## 2022-01-19 MED ORDER — MOLNUPIRAVIR EUA 200MG CAPSULE: 4.0000 | ORAL_CAPSULE | Freq: Two times a day (BID) | ORAL | 0 refills | Status: AC

## 2022-01-19 NOTE — Assessment & Plan Note (Addendum)
Reviewed currently approved EUA treatments.  Reviewed expected course of illness, anticipated course of recovery, as well as red flags to suggest COVID pneumonia and/or to seek urgent in-person care.  Reviewed latest CDC isolation/quarantine guidelines.  Encouraged fluids and rest. Reviewed further supportive care measures at home including vit C '500mg'$  bid, vit D 2000 IU daily, zinc '100mg'$  daily, tylenol PRN, pepcid '20mg'$  BID PRN.   Recommend:  Molnupiravir 5d course

## 2022-01-19 NOTE — Progress Notes (Signed)
Patient ID: Leon Estes, male    DOB: Jan 11, 1931, 88 y.o.   MRN: [id number]  Virtual visit completed through [employer], a video enabled telemedicine application. Due to national recommendations of social distancing due to COVID-19, a virtual visit is felt to be most appropriate for this patient at this time. Reviewed limitations, risks, security and privacy concerns of performing a virtual visit and the availability of in person appointments. I also reviewed that there may be a patient responsible charge related to this service. The patient agreed to proceed.   Converted to phone visit as he doesn't have virtual capacity.  Phone encounter: 12:15pm - 12:30pm  Patient location: home Provider location: [place] [facility], office Persons participating in this virtual visit: patient, provider, wife [name]  If any vitals were documented, they were collected by patient at home unless specified below.    BP 140/71   Pulse 69   Temp (!) 96.2 F (35.7 C)   Ht '5\' 7"'$  (1.702 m)   Wt 141 lb 5 oz (64.1 kg)   BMI 22.13 kg/m    CC: COVID infection Subjective:   HPI: Leon Estes is a 88 y.o. male presenting on 01/19/2022 for Covid Positive (C/o cough, chest congestion, runny nose, body aches and fatigue.  Sxs started 01/16/22. Pos home COVID this morning. )   First day of symptoms: 01/16/2022 Tested COVID positive: 01/19/2022  Current symptoms: cough, chest congestion, rhinorrhea, body aches and fatigue, mild abd discomfort  Wife also sick at home No: fever, dyspnea, nausea Treatments to date: tylenol and centrum multivitamin  Risk factors include: HTN, CAD, COPD, CKD stage 3, CHF H/o recurrent C diff infection.   COVID vaccination status: Moderna x2   Lab Results  Component Value Date   CREATININE 1.13 10/23/2021   BUN 14 10/23/2021   NA 139 10/23/2021   K 4.7 10/23/2021   CL 108 10/23/2021   CO2 26 10/23/2021  GFR = 58.     Relevant past medical, surgical, family and social  history reviewed and updated as indicated. Interim medical history since our last visit reviewed. Allergies and medications reviewed and updated. Outpatient Medications Prior to Visit  Medication Sig Dispense Refill   acetaminophen (TYLENOL) 500 MG tablet Take 1,000 mg by mouth every 6 (six) hours as needed for moderate pain or headache.     aspirin 81 MG EC tablet Take 1 tablet (81 mg total) by mouth daily. Swallow whole. 30 tablet 0   budesonide-formoterol (SYMBICORT) 160-4.5 MCG/ACT inhaler Inhale 2 puffs into the lungs 2 (two) times daily. 10.2 g 5   carvedilol (COREG) 3.125 MG tablet Take 1 tablet (3.125 mg total) by mouth 2 (two) times daily with a meal. 180 tablet 3   furosemide (LASIX) 20 MG tablet Take 1 tablet (20 mg total) by mouth daily. 90 tablet 3   gabapentin (NEURONTIN) 400 MG capsule TAKE 1 CAPSULE BY MOUTH EVERY NIGHT AT BEDTIME 90 capsule 3   losartan (COZAAR) 25 MG tablet Take 0.5 tablets (12.5 mg total) by mouth daily. 45 tablet 3   Multiple Vitamin (MULTIVITAMIN WITH MINERALS) TABS tablet Take 1 tablet by mouth daily.     Omega-3 Fatty Acids (FISH OIL) 1000 MG CAPS Take 1,000 mg by mouth daily.     omeprazole (PRILOSEC) 20 MG capsule Take 1 capsule (20 mg total) by mouth daily. 30 capsule 3   potassium chloride (KLOR-CON) 10 MEQ tablet Take 1 tablet (10 mEq total) by mouth daily. [place]  tablet 3   QUEtiapine (SEROQUEL) 25 MG tablet Take 1 tablet (25 mg total) by mouth at bedtime. 30 tablet 11   simvastatin (ZOCOR) 40 MG tablet Take 1 tablet (40 mg total) by mouth at bedtime. 90 tablet 3   tamsulosin (FLOMAX) 0.4 MG CAPS capsule TAKE 1 CAPSULE BY MOUTH ONCE DAILY (Patient taking differently: 0.4 mg daily after supper.) 90 capsule 3   traMADol (ULTRAM) 50 MG tablet Take 1 tablet (50 mg total) by mouth daily as needed (pain). 30 tablet 0   triamcinolone cream (KENALOG) 0.1 % Apply 1 application. topically 2 (two) times daily as needed. 45 g 1   No facility-administered medications  prior to visit.     Per HPI unless specifically indicated in ROS section below Review of Systems Objective:  BP 140/71   Pulse 69   Temp (!) 96.2 F (35.7 C)   Ht '5\' 7"'$  (1.702 m)   Wt 141 lb 5 oz (64.1 kg)   BMI 22.13 kg/m   Wt Readings from Last 3 Encounters:  01/19/22 141 lb 5 oz (64.1 kg)  01/09/22 144 lb 3.2 oz (65.4 kg)  12/04/21 141 lb (64 kg)      Physical exam: Pulm: speaks in complete sentences without increased work of breathing Psych: normal mood, normal thought content      Results for orders placed or performed in visit on 12/19/21  ECHOCARDIOGRAM COMPLETE  Result Value Ref Range   Area-P 1/2 2.17 cm2   S' Lateral 3.40 cm   Assessment & Plan:   Problem List Items Addressed This Visit     Benign essential HTN (Chronic)   CAD (coronary artery disease)   COPD (chronic obstructive pulmonary disease) (HCC)   Chronic renal disease, stage III (HCC)   Systolic heart failure ([place])   COVID-19 virus infection - Primary    Reviewed currently approved EUA treatments.  Reviewed expected course of illness, anticipated course of recovery, as well as red flags to suggest COVID pneumonia and/or to seek urgent in-person care.  Reviewed latest CDC isolation/quarantine guidelines.  Encouraged fluids and rest. Reviewed further supportive care measures at home including vit C '500mg'$  bid, vit D 2000 IU daily, zinc '100mg'$  daily, tylenol PRN, pepcid '20mg'$  BID PRN.   Recommend:  Molnupiravir 5d course      Relevant Medications   molnupiravir EUA (LAGEVRIO) 200 mg CAPS capsule     Meds ordered this encounter  Medications   molnupiravir EUA (LAGEVRIO) 200 mg CAPS capsule    Sig: Take 4 capsules (800 mg total) by mouth 2 (two) times daily for 5 days.    Dispense:  40 capsule    Refill:  0   Cholecalciferol (VITAMIN D) 50 MCG (2000 UT) CAPS    Sig: Take 1 capsule (2,000 Units total) by mouth daily.   No orders of the defined types were placed in this encounter.   I  discussed the assessment and treatment plan with the patient. The patient was provided an opportunity to ask questions and all were answered. The patient agreed with the plan and demonstrated an understanding of the instructions. The patient was advised to call back or seek an in-person evaluation if the symptoms worsen or if the condition fails to improve as anticipated.  Follow up plan: No follow-ups on file.  Ria Bush, MD

## 2022-01-19 NOTE — Telephone Encounter (Addendum)
Per Lauren "Danielle" Dollarhite, pt rtn call and confirms he has COVID.  Today's appt changed to phn visit since pt does not have video capability.

## 2022-01-29 ENCOUNTER — Other Ambulatory Visit: Payer: Self-pay | Admitting: Internal Medicine

## 2022-01-29 NOTE — Telephone Encounter (Signed)
Last filled 10-22-21 #30 Last OV Acute 01-19-22 Next OV 02-02-22 [place]

## 2022-01-31 ENCOUNTER — Ambulatory Visit (INDEPENDENT_AMBULATORY_CARE_PROVIDER_SITE_OTHER): Payer: PPO | Admitting: Internal Medicine

## 2022-01-31 ENCOUNTER — Ambulatory Visit (INDEPENDENT_AMBULATORY_CARE_PROVIDER_SITE_OTHER)
Admission: RE | Admit: 2022-01-31 | Discharge: 2022-01-31 | Disposition: A | Discharge status: Home / Self Care | Payer: PPO | Source: Ambulatory Visit | Attending: Internal Medicine | Admitting: Internal Medicine

## 2022-01-31 ENCOUNTER — Encounter: Payer: Self-pay | Admitting: Internal Medicine

## 2022-01-31 VITALS — BP 114/68 | HR 56 | Temp 98.5°F | Ht 67.0 in | Wt 143.0 lb

## 2022-01-31 DIAGNOSIS — R059 Cough, unspecified: Secondary | ICD-10-CM | POA: Diagnosis not present

## 2022-01-31 DIAGNOSIS — R051 Acute cough: Secondary | ICD-10-CM

## 2022-01-31 NOTE — Progress Notes (Signed)
Subjective:    Patient ID: Leon Estes, male    DOB: 1930-09-08, 88 y.o.   MRN: [id number]  HPI Here with wife due to ongoing symptoms after recent COVID infection  He got fever Some cough and rhinorrhea Wife was also sick Positive test 6/9 Got molnupiravir Some persistent cough--wife concerned about "congestion"  No fever Doesn't feel SOB  Current Outpatient Medications on File Prior to Visit  Medication Sig Dispense Refill   acetaminophen (TYLENOL) 500 MG tablet Take 1,000 mg by mouth every 6 (six) hours as needed for moderate pain or headache.     aspirin 81 MG EC tablet Take 1 tablet (81 mg total) by mouth daily. Swallow whole. 30 tablet 0   budesonide-formoterol (SYMBICORT) 160-4.5 MCG/ACT inhaler Inhale 2 puffs into the lungs 2 (two) times daily. 10.2 g 5   carvedilol (COREG) 3.125 MG tablet Take 1 tablet (3.125 mg total) by mouth 2 (two) times daily with a meal. 180 tablet 3   Cholecalciferol (VITAMIN D) 50 MCG (2000 UT) CAPS Take 1 capsule (2,000 Units total) by mouth daily.     furosemide (LASIX) 20 MG tablet Take 1 tablet (20 mg total) by mouth daily. 90 tablet 3   gabapentin (NEURONTIN) 400 MG capsule TAKE 1 CAPSULE BY MOUTH EVERY NIGHT AT BEDTIME 90 capsule 3   losartan (COZAAR) 25 MG tablet Take 0.5 tablets (12.5 mg total) by mouth daily. 45 tablet 3   Multiple Vitamin (MULTIVITAMIN WITH MINERALS) TABS tablet Take 1 tablet by mouth daily.     Omega-3 Fatty Acids (FISH OIL) 1000 MG CAPS Take 1,000 mg by mouth daily.     omeprazole (PRILOSEC) 20 MG capsule Take 1 capsule (20 mg total) by mouth daily. 30 capsule 3   potassium chloride (KLOR-CON) 10 MEQ tablet Take 1 tablet (10 mEq total) by mouth daily. 90 tablet 3   QUEtiapine (SEROQUEL) 25 MG tablet Take 1 tablet (25 mg total) by mouth at bedtime. 30 tablet 11   simvastatin (ZOCOR) 40 MG tablet Take 1 tablet (40 mg total) by mouth at bedtime. 90 tablet 3   tamsulosin (FLOMAX) 0.4 MG CAPS capsule TAKE 1 CAPSULE BY  MOUTH ONCE DAILY (Patient taking differently: 0.4 mg daily after supper.) 90 capsule 3   traMADol (ULTRAM) 50 MG tablet TAKE 1 TABLET BY MOUTH BY MOUTH DAILY ASNEEDED FOR PAIN 30 tablet 0   triamcinolone cream (KENALOG) 0.1 % Apply 1 application. topically 2 (two) times daily as needed. 45 g 1   No current facility-administered medications on file prior to visit.    Allergies  Allergen Reactions   Augmentin [Amoxicillin-Pot Clavulanate] Rash   Bacitracin-Polymyxin B Swelling and Rash   Benzalkonium Chloride     "Benzalkonium chloride is a quaternary ammonium antiseptic and disinfectant with actions and uses similar to those of other cationic surfactants. It is also used as an antimicrobial preservative for pharmaceutical products."   Neosporin [Neomycin-Bacitracin Zn-Polymyx] Rash   Sulfamethoxazole-Trimethoprim Rash   Terbinafine And Related Rash    Past Medical History:  Diagnosis Date   Asthma    BPH (benign prostatic hypertrophy)    Cataract    Chronic renal disease, stage III (HCC)    Chronic sinusitis    COPD (chronic obstructive pulmonary disease) ([place])    Coronary artery disease    2 stents   Gall stones    Hearing loss    DOES NOT WEAR HEARING AIDS   High cholesterol    Hypertension  Lactic acid acidosis 10/12/2021   Macular degeneration    right eye   Rash 12/04/2021   Septic shock ([place]) 10/10/2021    Past Surgical History:  Procedure Laterality Date   APPENDECTOMY     CORONARY ANGIOPLASTY WITH STENT PLACEMENT  1998   2 stents   TRANSURETHRAL RESECTION OF PROSTATE      Family History  Problem Relation Age of Onset   Diabetes Father    Heart disease Brother     Social History   Socioeconomic History   Marital status: Married    Spouse name: Not on file   Number of children: 4   Years of education: Not on file   Highest education level: Not on file  Occupational History   Occupation: Drove truck and concrete work  Tobacco Use   Smoking status:  Former    Types: Cigarettes    Quit date: 08/13/2005    Years since quitting: 16.4    Passive exposure: Past   Smokeless tobacco: Current    Types: Chew   Tobacco comments:    discussed stopping this (only once in a while)  Vaping Use   Vaping Use: Never used  Substance and Sexual Activity   Alcohol use: No   Drug use: No   Sexual activity: Not on file  Other Topics Concern   Not on file  Social History Narrative   No living will   Wife, then [place] daughter should make decisions   Would accept resuscitation   Would probably accept tube feeds   Social Determinants of Health   Financial Resource Strain: Not on file  Food Insecurity: Not on file  Transportation Needs: Not on file  Physical Activity: Not on file  Stress: Not on file  Social Connections: Not on file  Intimate Partner Violence: Not on file   Review of Systems Eating okay Had some loose stools---but improving Sleeping fine Feels a bit tired     Objective:   Physical Exam Constitutional:      Appearance: Normal appearance.  Cardiovascular:     Rate and Rhythm: Normal rate and regular rhythm.     Heart sounds: No murmur heard.    No gallop.     Comments: Frequent extra beats Pulmonary:     Effort: Pulmonary effort is normal.     Breath sounds: Normal breath sounds. No wheezing or rales.  Musculoskeletal:     Cervical back: Neck supple.     Right lower leg: No edema.     Left lower leg: No edema.  Lymphadenopathy:     Cervical: No cervical adenopathy.  Neurological:     Mental Status: He is alert.            Assessment & Plan:

## 2022-01-31 NOTE — Assessment & Plan Note (Addendum)
Probably just post COVID No systemic symptoms or SOB to suggest secondary bacterial infection Will check CXR just in case  CXR shows some ?interstitial marking---but no infiltrate Reassured Can use robitussin DM, or similar

## 2022-02-05 ENCOUNTER — Telehealth: Payer: Self-pay

## 2022-02-05 ENCOUNTER — Ambulatory Visit: Payer: PPO | Admitting: Infectious Disease

## 2022-02-05 NOTE — Progress Notes (Deleted)
Subjective:  Chief complaint follow-up for [name]   Patient ID: Leon Estes, male    DOB: 1930-10-25, 88 y.o.   MRN: [id number]  HPI  Leon Estes is an 88 year old 88 man with COPD, CAD had recurrent [name] according to Dr. Silvio Pate nearly 5 or 6 times.  The patient his wife tell me that on 1 occasion he spent a month in the hospital and there was concern that he might not survive.  This last admission he also was quite sick with septic type physiology.  Fortunately improved.  When I saw him in the hospital he was doing much better and on the verge of being discharged out of the hospital in the last day that I saw him.  I recommended for now doing a vancomycin pulse and taper regimen.  I had recommended by vancomycin 500 mg 4 times daily x14 days and then  Vancomycin 125 mg twice daily x7 days  Then  Vancomycin 125 mg daily x7 days  Vancomycin 125 mg every other days x28 days  Vancomycin 125 mg every 3 days x 28 days   Since I last saw him his diarrhea is resolved and some days he is not even having bowel movements.  He did develop a rash on his arms and now his hands that he is curious about.  It is a bit of a hip had a former rash and pruritic in nature.     Past Medical History:  Diagnosis Date  . Asthma   . BPH (benign prostatic hypertrophy)   . Cataract   . Chronic renal disease, stage III ([place])   . Chronic sinusitis   . COPD (chronic obstructive pulmonary disease) ([place])   . Coronary artery disease    2 stents  . Gall stones   . Hearing loss    DOES NOT WEAR HEARING AIDS  . High cholesterol   . Hypertension   . Lactic acid acidosis 10/12/2021  . Macular degeneration    right eye  . Rash 12/04/2021  . Septic shock ([place]) 10/10/2021    Past Surgical History:  Procedure Laterality Date  . APPENDECTOMY    . CORONARY ANGIOPLASTY WITH STENT PLACEMENT  1998   2 stents  . TRANSURETHRAL RESECTION OF PROSTATE      Family  History  Problem Relation Age of Onset  . Diabetes Father   . Heart disease Brother       Social History   Socioeconomic History  . Marital status: Married    Spouse name: Not on file  . Number of children: 4  . Years of education: Not on file  . Highest education level: Not on file  Occupational History  . Occupation: Drove truck and concrete work  Tobacco Use  . Smoking status: Former    Types: Cigarettes    Quit date: 08/13/2005    Years since quitting: 16.4    Passive exposure: Past  . Smokeless tobacco: Current    Types: Chew  . Tobacco comments:    discussed stopping this (only once in a while)  Vaping Use  . Vaping Use: Never used  Substance and Sexual Activity  . Alcohol use: No  . Drug use: No  . Sexual activity: Not on file  Other Topics Concern  . Not on file  Social History Narrative   No living will   Wife, then [place] daughter should make decisions   Would accept resuscitation   Would probably accept  tube feeds   Social Determinants of Health   Financial Resource Strain: Not on file  Food Insecurity: Not on file  Transportation Needs: Not on file  Physical Activity: Not on file  Stress: Not on file  Social Connections: Not on file    Allergies  Allergen Reactions  . Augmentin [Amoxicillin-Pot Clavulanate] Rash  . Bacitracin-Polymyxin B Swelling and Rash  . Benzalkonium Chloride     "Benzalkonium chloride is a quaternary ammonium antiseptic and disinfectant with actions and uses similar to those of other cationic surfactants. It is also used as an antimicrobial preservative for pharmaceutical products."  . Neosporin [Neomycin-Bacitracin Zn-Polymyx] Rash  . Sulfamethoxazole-Trimethoprim Rash  . Terbinafine And Related Rash     Current Outpatient Medications:  .  acetaminophen (TYLENOL) 500 MG tablet, Take 1,000 mg by mouth every 6 (six) hours as needed for moderate pain or headache., Disp: , Rfl:  .  aspirin 81 MG EC tablet, Take 1 tablet (81  mg total) by mouth daily. Swallow whole., Disp: 30 tablet, Rfl: 0 .  budesonide-formoterol (SYMBICORT) 160-4.5 MCG/ACT inhaler, Inhale 2 puffs into the lungs 2 (two) times daily., Disp: 10.2 g, Rfl: 5 .  carvedilol (COREG) 3.125 MG tablet, Take 1 tablet (3.125 mg total) by mouth 2 (two) times daily with a meal., Disp: 180 tablet, Rfl: 3 .  Cholecalciferol (VITAMIN D) 50 MCG (2000 UT) CAPS, Take 1 capsule (2,000 Units total) by mouth daily., Disp: , Rfl:  .  furosemide (LASIX) 20 MG tablet, Take 1 tablet (20 mg total) by mouth daily., Disp: 90 tablet, Rfl: 3 .  gabapentin (NEURONTIN) 400 MG capsule, TAKE 1 CAPSULE BY MOUTH EVERY NIGHT AT BEDTIME, Disp: 90 capsule, Rfl: 3 .  losartan (COZAAR) 25 MG tablet, Take 0.5 tablets (12.5 mg total) by mouth daily., Disp: 45 tablet, Rfl: 3 .  Multiple Vitamin (MULTIVITAMIN WITH MINERALS) TABS tablet, Take 1 tablet by mouth daily., Disp: , Rfl:  .  Omega-3 Fatty Acids (FISH OIL) 1000 MG CAPS, Take 1,000 mg by mouth daily., Disp: , Rfl:  .  omeprazole (PRILOSEC) 20 MG capsule, Take 1 capsule (20 mg total) by mouth daily., Disp: 30 capsule, Rfl: 3 .  potassium chloride (KLOR-CON) 10 MEQ tablet, Take 1 tablet (10 mEq total) by mouth daily., Disp: 90 tablet, Rfl: 3 .  QUEtiapine (SEROQUEL) 25 MG tablet, Take 1 tablet (25 mg total) by mouth at bedtime., Disp: 30 tablet, Rfl: 11 .  simvastatin (ZOCOR) 40 MG tablet, Take 1 tablet (40 mg total) by mouth at bedtime., Disp: 90 tablet, Rfl: 3 .  tamsulosin (FLOMAX) 0.4 MG CAPS capsule, TAKE 1 CAPSULE BY MOUTH ONCE DAILY (Patient taking differently: 0.4 mg daily after supper.), Disp: 90 capsule, Rfl: 3 .  traMADol (ULTRAM) 50 MG tablet, TAKE 1 TABLET BY MOUTH BY MOUTH DAILY ASNEEDED FOR PAIN, Disp: 30 tablet, Rfl: 0 .  triamcinolone cream (KENALOG) 0.1 %, Apply 1 application. topically 2 (two) times daily as needed., Disp: 45 g, Rfl: 1   Review of Systems     Objective:   Physical Exam  Rash 12/04/2021:            Assessment & Plan:   New rash: It is somewhat herpetiform but would not make sense to me to be herpes this far along and without typical location such as his perirectal area or around his oropharynx to have been a manifestation of HSV 2 or 1.  Perhaps it is a viral exanthem related to an infection he  could have acquired in one of his grandchildren.  If it persist I recommend he see dermatology  This should not be related to his oral vancomycin since oral vancomycin is not absorbed via the gut and he is also been tapering the dose as well.    Recurrent [name]:  Continue current tapering dose of vancomycin   Avoid proton pump inhibitors.  Avoid unnecessary antibiotics  Hypertension well controlled  There were no vitals filed for this visit.

## 2022-03-06 DIAGNOSIS — I10 Essential (primary) hypertension: Secondary | ICD-10-CM | POA: Diagnosis not present

## 2022-03-06 DIAGNOSIS — I480 Paroxysmal atrial fibrillation: Secondary | ICD-10-CM | POA: Diagnosis not present

## 2022-03-06 DIAGNOSIS — J449 Chronic obstructive pulmonary disease, unspecified: Secondary | ICD-10-CM | POA: Diagnosis not present

## 2022-03-06 DIAGNOSIS — H9110 Presbycusis, unspecified ear: Secondary | ICD-10-CM | POA: Diagnosis not present

## 2022-03-06 DIAGNOSIS — Z515 Encounter for palliative care: Secondary | ICD-10-CM | POA: Diagnosis not present

## 2022-03-06 DIAGNOSIS — Z9181 History of falling: Secondary | ICD-10-CM | POA: Diagnosis not present

## 2022-03-06 DIAGNOSIS — I739 Peripheral vascular disease, unspecified: Secondary | ICD-10-CM | POA: Diagnosis not present

## 2022-03-14 ENCOUNTER — Ambulatory Visit (INDEPENDENT_AMBULATORY_CARE_PROVIDER_SITE_OTHER): Payer: PPO | Admitting: Family Medicine

## 2022-03-14 ENCOUNTER — Encounter: Payer: Self-pay | Admitting: Family Medicine

## 2022-03-14 VITALS — BP 106/60 | HR 76 | Temp 98.1°F | Ht 67.0 in | Wt 139.5 lb

## 2022-03-14 DIAGNOSIS — N481 Balanitis: Secondary | ICD-10-CM | POA: Diagnosis not present

## 2022-03-14 DIAGNOSIS — R300 Dysuria: Secondary | ICD-10-CM | POA: Diagnosis not present

## 2022-03-14 LAB — POC URINALSYSI DIPSTICK (AUTOMATED)
Bilirubin, UA: NEGATIVE
Blood, UA: NEGATIVE
Glucose, UA: NEGATIVE
Ketones, UA: NEGATIVE
Leukocytes, UA: NEGATIVE
Nitrite, UA: NEGATIVE
Protein, UA: POSITIVE — AB
Spec Grav, UA: 1.015 (ref 1.010–1.025)
Urobilinogen, UA: 0.2 E.U./dL
pH, UA: 6 (ref 5.0–8.0)

## 2022-03-14 NOTE — Patient Instructions (Addendum)
Clotrimazole - use the end of your penis underneath the skin when it is irritated

## 2022-03-14 NOTE — Progress Notes (Signed)
Alece Koppel T. Lakin Romer, MD, [place] at [facility] [place], [address]  Phone: [phone]  FAX: [phone]  Leon Estes - 88 y.o. male  MRN [id number]  Date of Birth: 1931/04/10  Date: 03/14/2022  PCP: Venia Carbon, MD  Referral: Venia Carbon, MD  Chief Complaint  Patient presents with   Penis Itching   Subjective:   Leon Estes is a 88 y.o. very pleasant male patient with Body mass index is 21.85 kg/m. who presents with the following:  He presents with some burning and itching at the end of his penis.  Right now, is not bothering him at all and has not really bothered him today or yesterday.  Prior to then, he did have some burning and irritation at the glans penis and underneath the foreskin.  For a few days he did wash things very well and use a little Vaseline.  Now it is feeling better.    Review of Systems is noted in the HPI, as appropriate  Objective:   BP 106/60   Pulse 76   Temp 98.1 F (36.7 C) (Oral)   Ht '5\' 7"'$  (1.702 m)   Wt 139 lb 8 oz (63.3 kg)   SpO2 97%   BMI 21.85 kg/m   GEN: No acute distress; alert,appropriate. PULM: Breathing comfortably in no respiratory distress PSYCH: Normally interactive.   Normal male.  No discharge.  No ulceration or wound.  No evidence of irritation in and about the glans penis.  Laboratory and Imaging Data: Results for orders placed or performed in visit on 03/14/22  POCT Urinalysis Dipstick (Automated)  Result Value Ref Range   Color, UA Yellow    Clarity, UA Clear    Glucose, UA Negative Negative   Bilirubin, UA Negative    Ketones, UA Negative    Spec Grav, UA 1.015 1.010 - 1.025   Blood, UA Negative    pH, UA 6.0 5.0 - 8.0   Protein, UA Positive (A) Negative   Urobilinogen, UA 0.2 0.2 or 1.0 E.U./dL   Nitrite, UA Negative    Leukocytes, UA Negative Negative     Assessment and Plan:     ICD-10-CM   1. Balanitis  N48.1      2. Dysuria  R30.0 POCT Urinalysis Dipstick (Automated)     Sounds like he at the very least had an inflammatory balanitis.  It appears to be better now.  We went over some general care, and I think if it persist and has some burning and tingling category, then I would have him use some topical clotrimazole twice daily.  Certainly, candidal balanitis is also quite common.  Regardless, right now the area appears calm and normal.  Patient Instructions  Clotrimazole - use the end of your penis underneath the skin when it is irritated   Orders placed today for conditions managed today: Orders Placed This Encounter  Procedures   POCT Urinalysis Dipstick (Automated)    Follow-up if needed: No follow-ups on file.  Dragon [employer] One speech-to-text software was used for transcription in this dictation.  Possible transcriptional errors can occur using Editor, commissioning.   Signed,  [name]. Bhavesh Vazquez, MD   Outpatient Encounter Medications as of 03/14/2022  Medication Sig   acetaminophen (TYLENOL) 500 MG tablet Take 1,000 mg by mouth every 6 (six) hours as needed for moderate pain or headache.   aspirin 81 MG EC tablet Take 1 tablet (81  mg total) by mouth daily. Swallow whole.   budesonide-formoterol (SYMBICORT) 160-4.5 MCG/ACT inhaler Inhale 2 puffs into the lungs 2 (two) times daily.   carvedilol (COREG) 3.125 MG tablet Take 1 tablet (3.125 mg total) by mouth 2 (two) times daily with a meal.   Cholecalciferol (VITAMIN D) 50 MCG (2000 UT) CAPS Take 1 capsule (2,000 Units total) by mouth daily.   furosemide (LASIX) 20 MG tablet Take 1 tablet (20 mg total) by mouth daily.   gabapentin (NEURONTIN) 400 MG capsule TAKE 1 CAPSULE BY MOUTH EVERY NIGHT AT BEDTIME   losartan (COZAAR) 25 MG tablet Take 0.5 tablets (12.5 mg total) by mouth daily.   Multiple Vitamin (MULTIVITAMIN WITH MINERALS) TABS tablet Take 1 tablet by mouth daily.   Omega-3 Fatty Acids (FISH OIL) 1000 MG CAPS Take 1,000 mg by mouth  daily.   omeprazole (PRILOSEC) 20 MG capsule Take 1 capsule (20 mg total) by mouth daily.   potassium chloride (KLOR-CON) 10 MEQ tablet Take 1 tablet (10 mEq total) by mouth daily.   QUEtiapine (SEROQUEL) 25 MG tablet Take 1 tablet (25 mg total) by mouth at bedtime.   simvastatin (ZOCOR) 40 MG tablet Take 1 tablet (40 mg total) by mouth at bedtime.   tamsulosin (FLOMAX) 0.4 MG CAPS capsule TAKE 1 CAPSULE BY MOUTH ONCE DAILY (Patient taking differently: 0.4 mg daily after supper.)   traMADol (ULTRAM) 50 MG tablet TAKE 1 TABLET BY MOUTH BY MOUTH DAILY ASNEEDED FOR PAIN   triamcinolone cream (KENALOG) 0.1 % Apply 1 application. topically 2 (two) times daily as needed.   No facility-administered encounter medications on file as of 03/14/2022.

## 2022-03-19 DIAGNOSIS — M1711 Unilateral primary osteoarthritis, right knee: Secondary | ICD-10-CM | POA: Diagnosis not present

## 2022-03-19 MED ORDER — TRIAMCINOLONE ACETONIDE 40 MG/ML IJ SUSP: 40.0000 mg | Freq: Once | INTRAMUSCULAR | Status: DC

## 2022-03-19 MED ORDER — TRIAMCINOLONE ACETONIDE 40 MG/ML IJ SUSP
40.0000 mg | Freq: Once | INTRAMUSCULAR | Status: AC
  Administered 2022-03-19: 40 mg via INTRA_ARTICULAR

## 2022-03-19 NOTE — Addendum Note (Signed)
Addended by: Brenton Grills on: 01/15/8471 07:21 AM   Modules accepted: Orders

## 2022-03-26 ENCOUNTER — Ambulatory Visit: Payer: PPO | Admitting: Infectious Disease

## 2022-04-01 NOTE — Progress Notes (Unsigned)
    Leon Estes T. Panzy Bubeck, MD, [place] at [facility] [place], [address]  Phone: [phone]  FAX: [phone]  Leon Estes - 88 y.o. male  MRN [id number]  Date of Birth: 1931-04-28  Date: 04/02/2022  PCP: Venia Carbon, MD  Referral: Venia Carbon, MD  No chief complaint on file.  Subjective:   Leon Estes is a 88 y.o. very pleasant male patient with There is no height or weight on file to calculate BMI. who presents with the following:  Leon Estes is a very well-known elderly gentleman the presents with some ongoing right knee pain.  The last time I saw him regards to his knee, it was read about his right-sided knee osteoarthritis in February 2023.  At that point, I did a corticosteroid injection of his right knee.    Review of Systems is noted in the HPI, as appropriate  Objective:   There were no vitals taken for this visit.  GEN: No acute distress; alert,appropriate. PULM: Breathing comfortably in no respiratory distress PSYCH: Normally interactive.   Laboratory and Imaging Data:  Assessment and Plan:   ***

## 2022-04-02 ENCOUNTER — Encounter: Payer: Self-pay | Admitting: Family Medicine

## 2022-04-02 ENCOUNTER — Ambulatory Visit (INDEPENDENT_AMBULATORY_CARE_PROVIDER_SITE_OTHER): Payer: PPO | Admitting: Family Medicine

## 2022-04-02 VITALS — BP 110/56 | HR 74 | Temp 98.6°F | Ht 67.0 in | Wt 142.0 lb

## 2022-04-02 DIAGNOSIS — R21 Rash and other nonspecific skin eruption: Secondary | ICD-10-CM

## 2022-04-02 DIAGNOSIS — G609 Hereditary and idiopathic neuropathy, unspecified: Secondary | ICD-10-CM | POA: Diagnosis not present

## 2022-04-02 DIAGNOSIS — M1711 Unilateral primary osteoarthritis, right knee: Secondary | ICD-10-CM

## 2022-04-02 MED ORDER — TRIAMCINOLONE ACETONIDE 0.1 % EX CREA: 1.0000 | TOPICAL_CREAM | Freq: Two times a day (BID) | CUTANEOUS | 1 refills | Status: DC | PRN

## 2022-04-02 MED ORDER — PREDNISONE 20 MG PO TABS: ORAL_TABLET | ORAL | 0 refills | Status: DC

## 2022-04-02 NOTE — Patient Instructions (Signed)
Try taking Gabapentin in the morning and at night to see if it helps.

## 2022-04-20 IMAGING — CT CT ABD-PELV W/O CM
2 of 4 series · 16 of 46 positions shown, 18 images · non-contrast
Comparison: 01/02/2021

CLINICAL DATA: Abdominal pain, recurrent C difficile infection

EXAM:
CT ABDOMEN AND PELVIS WITHOUT CONTRAST
TECHNIQUE: Multidetector CT imaging of the abdomen and pelvis was performed
following the standard protocol without IV contrast.

[Series 3: ap without · axial · non-contrast · 0.84mm/px · z∈[-482,-82]mm · 13 of 92 slices shown, 15 images]
[im 6/92  soft-tissue]
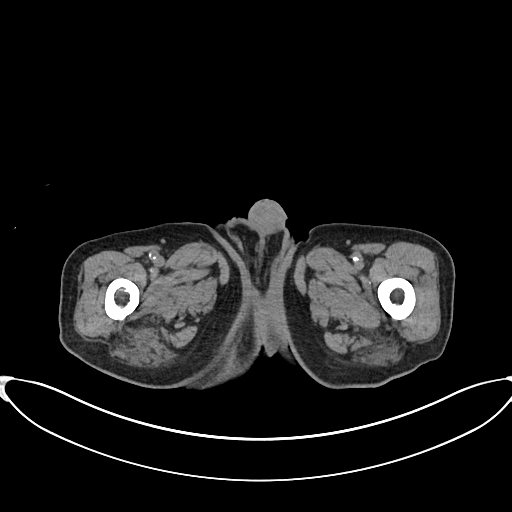
[im 6/92  bone]
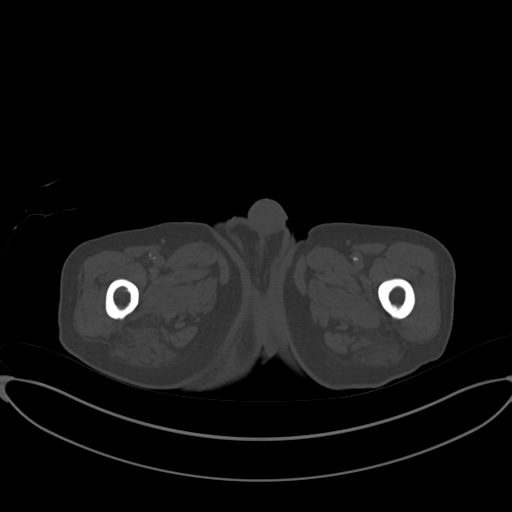
[im 11/92  soft-tissue]
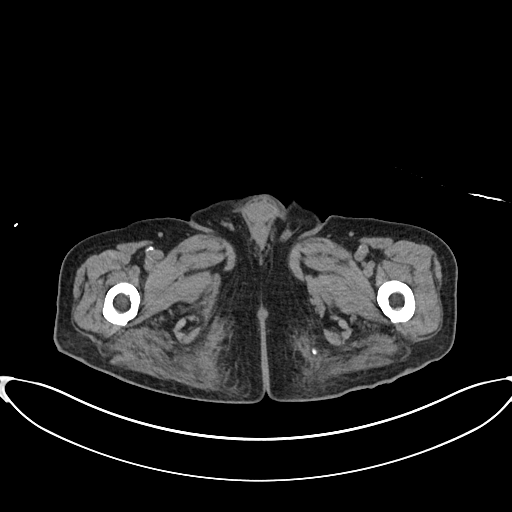
[im 21/92  soft-tissue]
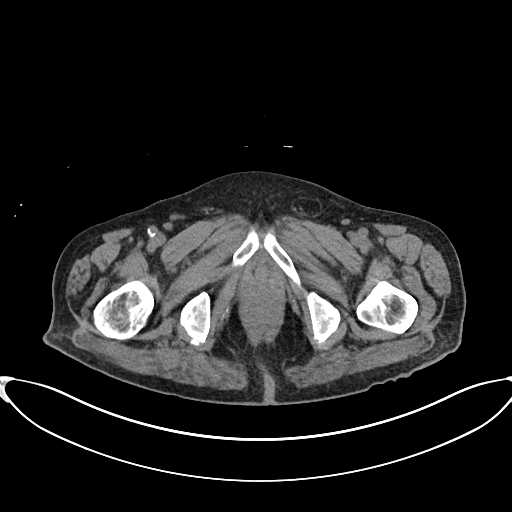
[im 26/92  soft-tissue]
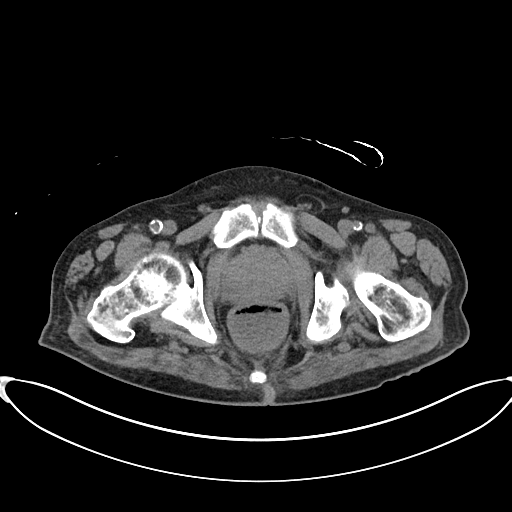
[im 31/92  soft-tissue]
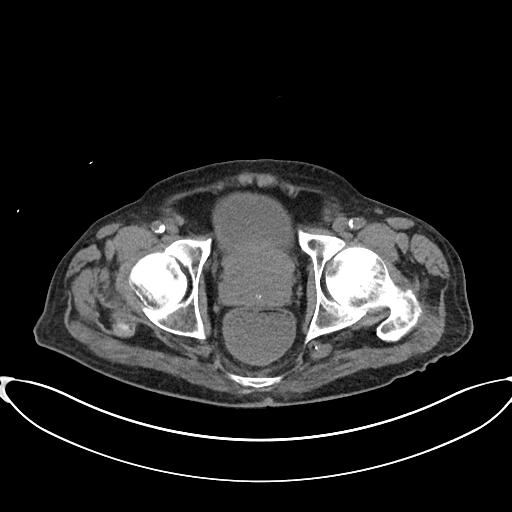
[im 41/92  soft-tissue]
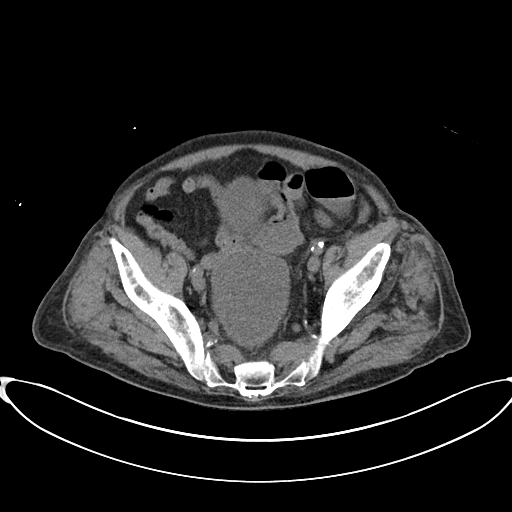
[im 46/92  soft-tissue]
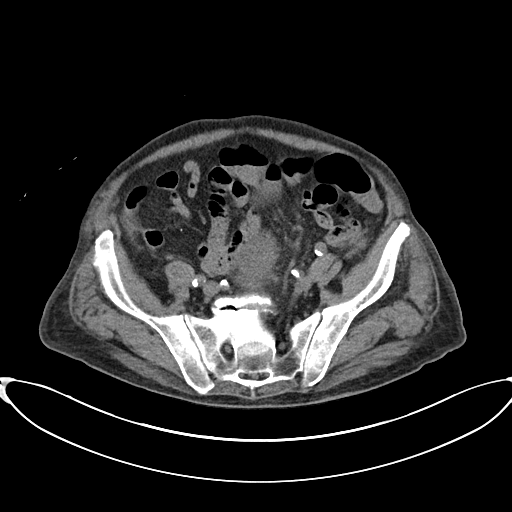
[im 51/92  soft-tissue]
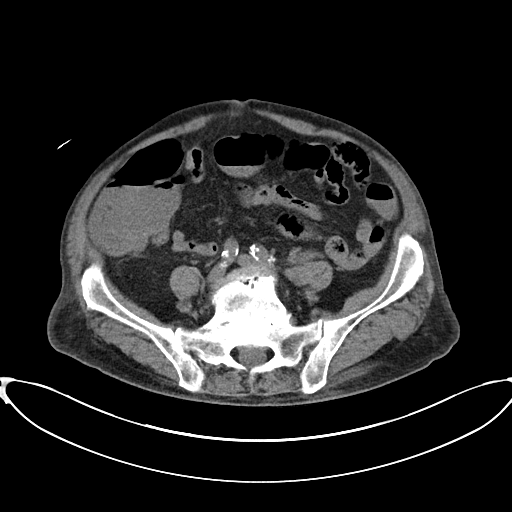
[im 61/92  soft-tissue]
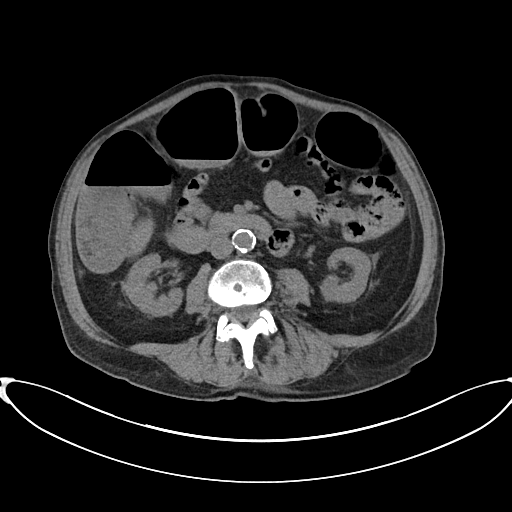
[im 61/92  bone]
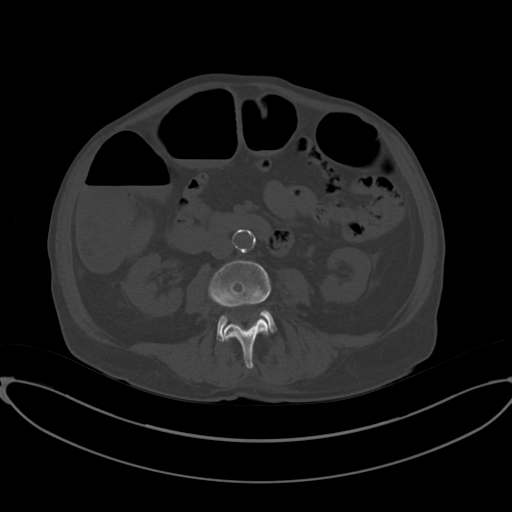
[im 66/92  soft-tissue]
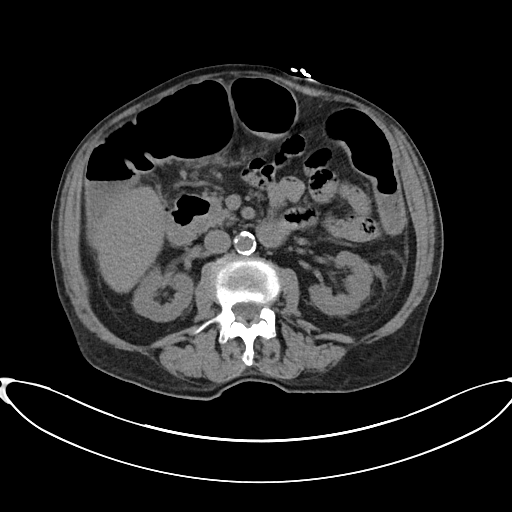
[im 71/92  soft-tissue]
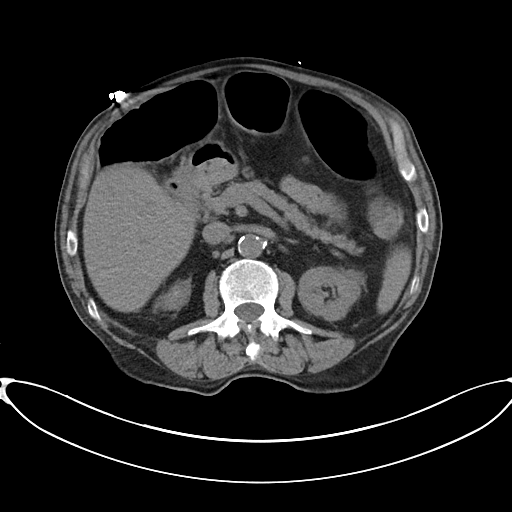
[im 81/92  soft-tissue]
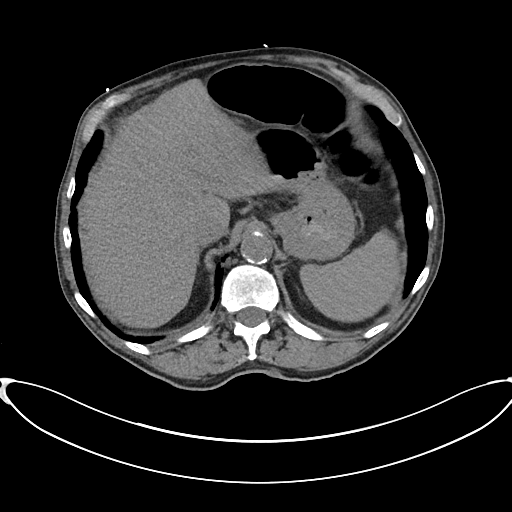
[im 86/92  soft-tissue]
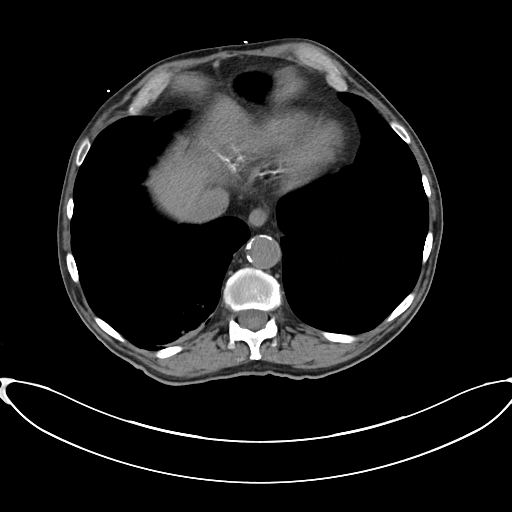

[Series 6: cor · coronal · 0.73mm/px · 3 of 110 slices shown]
[im 37/110  soft-tissue]
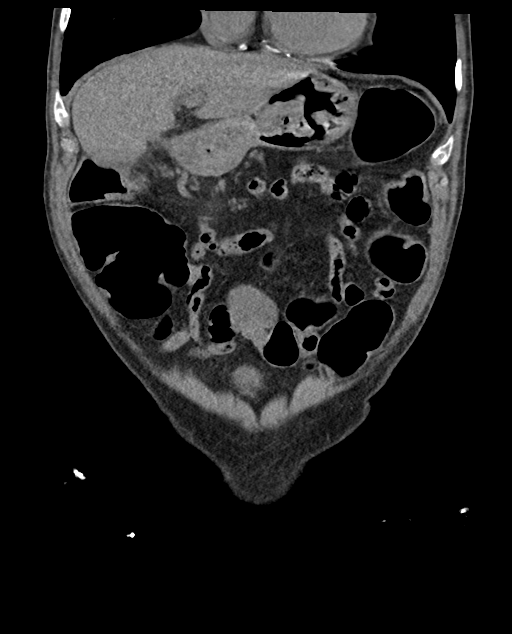
[im 49/110  soft-tissue]
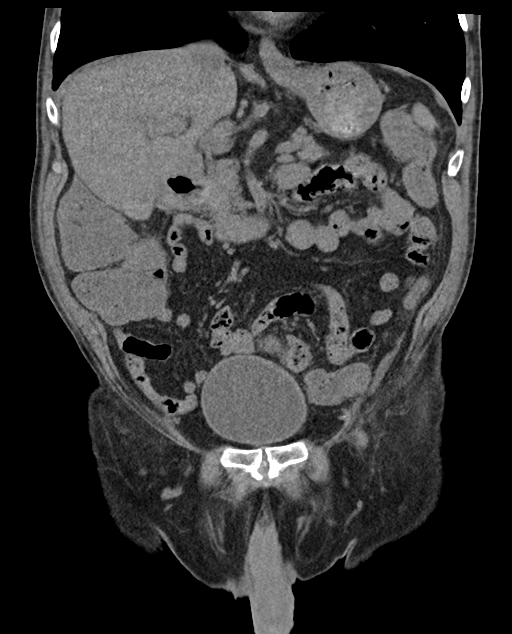
[im 61/110  soft-tissue]
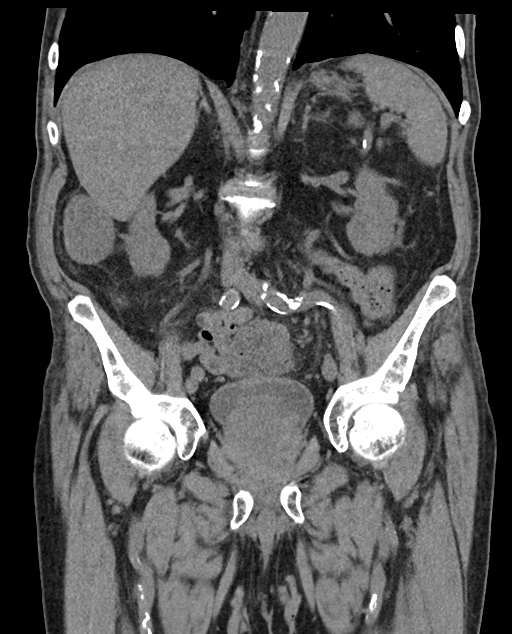

[16 of 46 positions shown; findings below may reference images not displayed]

FINDINGS: Lower chest: No acute abnormality. Coronary artery calcifications.
Scarring and or atelectasis of the dependent right lung base.

Hepatobiliary: No focal liver abnormality is seen. Status post
cholecystectomy. No biliary dilatation.

Pancreas: Unremarkable. No pancreatic ductal dilatation or
surrounding inflammatory changes.

Spleen: Normal in size without significant abnormality.

Adrenals/Urinary Tract: Adrenal glands are unremarkable. Punctuate
nonobstructive calculus of the superior pole of the right kidney.
Bladder is unremarkable.

Stomach/Bowel: Stomach is within normal limits. Appendix is not
clearly visualized and may be surgically absent. Colon is somewhat
patulous and fluid-filled to the rectum, however not overtly
distended, largest loops measuring 6.5 cm. No acute inflammatory
findings.

Vascular/Lymphatic: Aortic atherosclerosis. No enlarged abdominal or
pelvic lymph nodes.

Reproductive: Prostatomegaly.

Other: No abdominal wall hernia or abnormality. No abdominopelvic
ascites.

Musculoskeletal: No acute or significant osseous findings. Chronic
bilateral pars defects of L5 with anterolisthesis of L5 on S1.
IMPRESSION: 1. Colon is somewhat patulous and fluid-filled to the rectum,
consistent with diarrheal illness, however not overtly distended and
without acute inflammatory findings.
2. Nonobstructive right nephrolithiasis.  No hydronephrosis.
3. Prostatomegaly.
4. Coronary artery disease.

Aortic Atherosclerosis ([id number]).

## 2022-04-27 ENCOUNTER — Other Ambulatory Visit: Payer: Self-pay | Admitting: Internal Medicine

## 2022-04-27 NOTE — Telephone Encounter (Signed)
Refill request Tramadol Last refill 01/29/22 #30 Last office visit 04/02/22 acute

## 2022-05-01 DIAGNOSIS — Z515 Encounter for palliative care: Secondary | ICD-10-CM | POA: Diagnosis not present

## 2022-05-01 DIAGNOSIS — H353290 Exudative age-related macular degeneration, unspecified eye, stage unspecified: Secondary | ICD-10-CM | POA: Diagnosis not present

## 2022-05-01 DIAGNOSIS — J449 Chronic obstructive pulmonary disease, unspecified: Secondary | ICD-10-CM | POA: Diagnosis not present

## 2022-05-01 DIAGNOSIS — Z9181 History of falling: Secondary | ICD-10-CM | POA: Diagnosis not present

## 2022-05-01 DIAGNOSIS — D692 Other nonthrombocytopenic purpura: Secondary | ICD-10-CM | POA: Diagnosis not present

## 2022-05-01 DIAGNOSIS — I110 Hypertensive heart disease with heart failure: Secondary | ICD-10-CM | POA: Diagnosis not present

## 2022-05-01 DIAGNOSIS — I480 Paroxysmal atrial fibrillation: Secondary | ICD-10-CM | POA: Diagnosis not present

## 2022-05-01 DIAGNOSIS — Z6823 Body mass index (BMI) 23.0-23.9, adult: Secondary | ICD-10-CM | POA: Diagnosis not present

## 2022-05-01 DIAGNOSIS — I5022 Chronic systolic (congestive) heart failure: Secondary | ICD-10-CM | POA: Diagnosis not present

## 2022-05-01 DIAGNOSIS — Z862 Personal history of diseases of the blood and blood-forming organs and certain disorders involving the immune mechanism: Secondary | ICD-10-CM | POA: Diagnosis not present

## 2022-05-03 ENCOUNTER — Encounter: Payer: Self-pay | Admitting: Internal Medicine

## 2022-05-03 ENCOUNTER — Ambulatory Visit (INDEPENDENT_AMBULATORY_CARE_PROVIDER_SITE_OTHER): Payer: PPO | Admitting: Internal Medicine

## 2022-05-03 DIAGNOSIS — L0231 Cutaneous abscess of buttock: Secondary | ICD-10-CM | POA: Diagnosis not present

## 2022-05-03 MED ORDER — DOXYCYCLINE HYCLATE 100 MG PO TABS: 100.0000 mg | ORAL_TABLET | Freq: Two times a day (BID) | ORAL | 0 refills | Status: DC

## 2022-05-03 NOTE — Progress Notes (Signed)
Subjective:    Patient ID: Leon Estes, male    DOB: 1931-04-20, 88 y.o.   MRN: [id number]  HPI Here with wife due to concern about a buttock abscess  Has knot on buttock Very sore Noticed it 2 days ago No injury No Rx  Current Outpatient Medications on File Prior to Visit  Medication Sig Dispense Refill   acetaminophen (TYLENOL) 500 MG tablet Take 1,000 mg by mouth every 6 (six) hours as needed for moderate pain or headache.     aspirin 81 MG EC tablet Take 1 tablet (81 mg total) by mouth daily. Swallow whole. 30 tablet 0   budesonide-formoterol (SYMBICORT) 160-4.5 MCG/ACT inhaler Inhale 2 puffs into the lungs 2 (two) times daily. 10.2 g 5   carvedilol (COREG) 3.125 MG tablet Take 1 tablet (3.125 mg total) by mouth 2 (two) times daily with a meal. 180 tablet 3   Cholecalciferol (VITAMIN D) 50 MCG (2000 UT) CAPS Take 1 capsule (2,000 Units total) by mouth daily.     furosemide (LASIX) 20 MG tablet Take 1 tablet (20 mg total) by mouth daily. 90 tablet 3   gabapentin (NEURONTIN) 400 MG capsule TAKE 1 CAPSULE BY MOUTH EVERY NIGHT AT BEDTIME 90 capsule 3   losartan (COZAAR) 25 MG tablet Take 0.5 tablets (12.5 mg total) by mouth daily. 45 tablet 3   Multiple Vitamin (MULTIVITAMIN WITH MINERALS) TABS tablet Take 1 tablet by mouth daily.     omeprazole (PRILOSEC) 20 MG capsule Take 1 capsule (20 mg total) by mouth daily. 30 capsule 3   potassium chloride (KLOR-CON) 10 MEQ tablet Take 1 tablet (10 mEq total) by mouth daily. 90 tablet 3   QUEtiapine (SEROQUEL) 25 MG tablet Take 1 tablet (25 mg total) by mouth at bedtime. 30 tablet 11   simvastatin (ZOCOR) 40 MG tablet Take 1 tablet (40 mg total) by mouth at bedtime. 90 tablet 3   tamsulosin (FLOMAX) 0.4 MG CAPS capsule TAKE 1 CAPSULE BY MOUTH ONCE DAILY (Patient taking differently: 0.4 mg daily after supper.) 90 capsule 3   traMADol (ULTRAM) 50 MG tablet TAKE 1 TABLET BY MOUTH BY MOUTH DAILY ASNEEDED FOR PAIN 30 tablet 0   triamcinolone  cream (KENALOG) 0.1 % Apply 1 Application topically 2 (two) times daily as needed. 453.6 g 1   No current facility-administered medications on file prior to visit.    Allergies  Allergen Reactions   Augmentin [Amoxicillin-Pot Clavulanate] Rash   Bacitracin-Polymyxin B Swelling and Rash   Benzalkonium Chloride     "Benzalkonium chloride is a quaternary ammonium antiseptic and disinfectant with actions and uses similar to those of other cationic surfactants. It is also used as an antimicrobial preservative for pharmaceutical products."   Neosporin [Neomycin-Bacitracin Zn-Polymyx] Rash   Sulfamethoxazole-Trimethoprim Rash   Terbinafine And Related Rash    Past Medical History:  Diagnosis Date   Asthma    BPH (benign prostatic hypertrophy)    Cataract    Chronic renal disease, stage III (HCC)    Chronic sinusitis    COPD (chronic obstructive pulmonary disease) (HCC)    Coronary artery disease    2 stents   Gall stones    Hearing loss    DOES NOT WEAR HEARING AIDS   High cholesterol    Hypertension    Lactic acid acidosis 10/12/2021   Macular degeneration    right eye   Rash 12/04/2021   Septic shock ([place]) 10/10/2021    Past Surgical History:  Procedure Laterality  Date   APPENDECTOMY     CORONARY ANGIOPLASTY WITH STENT PLACEMENT  1998   2 stents   TRANSURETHRAL RESECTION OF PROSTATE      Family History  Problem Relation Age of Onset   Diabetes Father    Heart disease Brother     Social History   Socioeconomic History   Marital status: Married    Spouse name: Not on file   Number of children: 4   Years of education: Not on file   Highest education level: Not on file  Occupational History   Occupation: Drove truck and concrete work  Tobacco Use   Smoking status: Former    Types: Cigarettes    Quit date: 08/13/2005    Years since quitting: 16.7    Passive exposure: Past   Smokeless tobacco: Current    Types: Chew   Tobacco comments:    discussed stopping this  (only once in a while)  Vaping Use   Vaping Use: Never used  Substance and Sexual Activity   Alcohol use: No   Drug use: No   Sexual activity: Not on file  Other Topics Concern   Not on file  Social History Narrative   No living will   Wife, then [place] daughter should make decisions   Would accept resuscitation   Would probably accept tube feeds   Social Determinants of Health   Financial Resource Strain: Not on file  Food Insecurity: Not on file  Transportation Needs: Not on file  Physical Activity: Not on file  Stress: Not on file  Social Connections: Not on file  Intimate Partner Violence: Not on file   Review of Systems No fever No diarrhea     Objective:   Physical Exam Genitourinary:    Comments: ~1cm firm well circumscribed mass in perineum----along lower right buttock close to scrotum Has hemorrhoid (non inflamed) but mass is not close to the rectum With pressure-slight pus extruded from open area           Assessment & Plan:

## 2022-05-03 NOTE — Patient Instructions (Signed)
Please try warm compresses and pushing on the lump to try to drain it. If it gets more painful or red, start the antibiotic (I gave 7 days but hopefully 3 would be enough)

## 2022-05-03 NOTE — Assessment & Plan Note (Signed)
May be infected cyst or gland Very well circumscribed Minimal tenderness and buttock surface not really red  Will have wife to warm compresses and try to express out the mass If worsens, or more pain--will start doxy 100 bid for 3-7 days (will try to avoid due to his C diff)

## 2022-05-07 ENCOUNTER — Ambulatory Visit: Payer: PPO | Admitting: Family Medicine

## 2022-05-23 ENCOUNTER — Encounter: Payer: Self-pay | Admitting: Family Medicine

## 2022-05-23 ENCOUNTER — Ambulatory Visit (INDEPENDENT_AMBULATORY_CARE_PROVIDER_SITE_OTHER): Payer: PPO | Admitting: Family Medicine

## 2022-05-23 VITALS — BP 110/60 | HR 76 | Temp 98.0°F | Ht 67.0 in | Wt 147.1 lb

## 2022-05-23 DIAGNOSIS — R1011 Right upper quadrant pain: Secondary | ICD-10-CM | POA: Diagnosis not present

## 2022-05-23 NOTE — Addendum Note (Signed)
Addended by: Ellamae Sia on: 05/23/2022 02:47 PM   Modules accepted: Orders

## 2022-05-23 NOTE — Progress Notes (Signed)
[name], MD, [place] at [facility] [name], [address]  Phone: [phone]  FAX: [phone]  Leon Estes - 88 y.o. male  MRN [id number]  Date of Birth: 06/13/1931  Date: 05/23/2022  PCP: Venia Carbon, MD  Referral: Venia Carbon, MD  Chief Complaint  Patient presents with   Leon Estes Pain   Subjective:   [name] is a 88 y.o. very pleasant male patient with Body mass index is 23.04 kg/m. who presents with the following:  Emil and his family are well-known to me.  He presents today with some right-sided side pain.  Really, this is more right upper quadrant pain.  He thinks it might be better today compared to yesterday, it showed up out of the blue.  He has not had any accident or injury, he has not been doing anything repetitive.  He does not have any nausea, vomiting, diarrhea, or constipation.  He denies flank pain.  No bloody bowel movements and no bloody urination  He does have a history of some severe [name] last year.  Eating and drinking ok, normal BM and urination.  Has been doing it off and on.    Review of Systems is noted in the HPI, as appropriate  Objective:   BP 110/60   Pulse 76   Temp 98 F (36.7 C) (Oral)   Ht '5\' 7"'$  (1.702 m)   Wt 147 lb 2 oz (66.7 kg)   SpO2 95%   BMI 23.04 kg/m   GEN: No acute distress; alert,appropriate. PULM: Breathing comfortably in no respiratory distress PSYCH: Normally interactive.  ABD: S, mild right upper quadrant tenderness, ND, + BS, No rebound, No HSM   Laboratory and Imaging Data:  Assessment and Plan:     ICD-10-CM   1. RUQ pain  R10.11 US ABDOMEN LIMITED RUQ (LIVER/GB)    CBC with Differential/Platelet    Basic metabolic panel    Hepatic function panel     Right upper quadrant tenderness of unclear source.  This does feel somewhat better compared to yesterday, so hopefully this is benign.  Given  location, obtain a right upper quadrant ultrasound to evaluate the liver and gallbladder.  Additionally check liver function, renal function.  Orders placed today for conditions managed today: Orders Placed This Encounter  Procedures   US ABDOMEN LIMITED RUQ (LIVER/GB)   CBC with Differential/Platelet   Basic metabolic panel   Hepatic function panel    Disposition: No follow-ups on file.  [employer] One speech-to-text software was used for transcription in this dictation.  Possible transcriptional errors can occur using Editor, commissioning.   Signed,  [name]. Shavanna Furnari, MD   Outpatient Encounter Medications as of 05/23/2022  Medication Sig   acetaminophen (TYLENOL) 500 MG tablet Take 1,000 mg by mouth every 6 (six) hours as needed for moderate pain or headache.   aspirin 81 MG EC tablet Take 1 tablet (81 mg total) by mouth daily. Swallow whole.   budesonide-formoterol (SYMBICORT) 160-4.5 MCG/ACT inhaler Inhale 2 puffs into the lungs 2 (two) times daily.   carvedilol (COREG) 3.125 MG tablet Take 1 tablet (3.125 mg total) by mouth 2 (two) times daily with a meal.   Cholecalciferol (VITAMIN D) 50 MCG (2000 UT) CAPS Take 1 capsule (2,000 Units total) by mouth daily.   doxycycline (VIBRA-TABS) 100 MG tablet Take 1 tablet (100 mg total) by mouth 2 (two) times daily.  furosemide (LASIX) 20 MG tablet Take 1 tablet (20 mg total) by mouth daily.   gabapentin (NEURONTIN) 400 MG capsule TAKE 1 CAPSULE BY MOUTH EVERY NIGHT AT BEDTIME   losartan (COZAAR) 25 MG tablet Take 0.5 tablets (12.5 mg total) by mouth daily.   Multiple Vitamin (MULTIVITAMIN WITH MINERALS) TABS tablet Take 1 tablet by mouth daily.   omeprazole (PRILOSEC) 20 MG capsule Take 1 capsule (20 mg total) by mouth daily.   potassium chloride (KLOR-CON) 10 MEQ tablet Take 1 tablet (10 mEq total) by mouth daily.   QUEtiapine (SEROQUEL) 25 MG tablet Take 1 tablet (25 mg total) by mouth at bedtime.   simvastatin (ZOCOR) 40 MG  tablet Take 1 tablet (40 mg total) by mouth at bedtime.   tamsulosin (FLOMAX) 0.4 MG CAPS capsule TAKE 1 CAPSULE BY MOUTH ONCE DAILY (Patient taking differently: 0.4 mg daily after supper.)   traMADol (ULTRAM) 50 MG tablet TAKE 1 TABLET BY MOUTH BY MOUTH DAILY ASNEEDED FOR PAIN   triamcinolone cream (KENALOG) 0.1 % Apply 1 Application topically 2 (two) times daily as needed.   No facility-administered encounter medications on file as of 05/23/2022.

## 2022-05-24 LAB — HEPATIC FUNCTION PANEL
ALT: 11 U/L (ref 0–53)
AST: 16 U/L (ref 0–37)
Albumin: 3.9 g/dL (ref 3.5–5.2)
Alkaline Phosphatase: 74 U/L (ref 39–117)
Bilirubin, Direct: 0.1 mg/dL (ref 0.0–0.3)
Total Bilirubin: 0.3 mg/dL (ref 0.2–1.2)
Total Protein: 6.8 g/dL (ref 6.0–8.3)

## 2022-05-24 LAB — BASIC METABOLIC PANEL
BUN: 12 mg/dL (ref 6–23)
CO2: 28 mEq/L (ref 19–32)
Calcium: 9.4 mg/dL (ref 8.4–10.5)
Chloride: 102 mEq/L (ref 96–112)
Creatinine, Ser: 1.24 mg/dL (ref 0.40–1.50)
GFR: 51.67 mL/min — ABNORMAL LOW (ref >60.00)
Glucose, Bld: 121 mg/dL — ABNORMAL HIGH (ref 70–99)
Potassium: 4 mEq/L (ref 3.5–5.1)
Sodium: 138 mEq/L (ref 135–145)

## 2022-05-24 LAB — CBC WITH DIFFERENTIAL/PLATELET
Basophils Absolute: 0.1 10*3/uL (ref 0.0–0.1)
Basophils Relative: 1.2 % (ref 0.0–3.0)
Eosinophils Absolute: 0.1 10*3/uL (ref 0.0–0.7)
Eosinophils Relative: 1.3 % (ref 0.0–5.0)
HCT: 32.6 % — ABNORMAL LOW (ref 39.0–52.0)
Hemoglobin: 10.4 g/dL — ABNORMAL LOW (ref 13.0–17.0)
Lymphocytes Relative: 30.8 % (ref 12.0–46.0)
Lymphs Abs: 2.9 10*3/uL (ref 0.7–4.0)
MCHC: 31.9 g/dL (ref 30.0–36.0)
MCV: 83 fl (ref 78.0–100.0)
Monocytes Absolute: 1.1 10*3/uL — ABNORMAL HIGH (ref 0.1–1.0)
Monocytes Relative: 11.8 % (ref 3.0–12.0)
Neutro Abs: 5.2 10*3/uL (ref 1.4–7.7)
Neutrophils Relative %: 54.9 % (ref 43.0–77.0)
Platelets: 240 10*3/uL (ref 150.0–400.0)
RBC: 3.93 Mil/uL — ABNORMAL LOW (ref 4.22–5.81)
RDW: 15.6 % — ABNORMAL HIGH (ref 11.5–15.5)
WBC: 9.5 10*3/uL (ref 4.0–10.5)

## 2022-05-24 LAB — LIPASE: Lipase: 13 U/L (ref 11.0–59.0)

## 2022-06-05 ENCOUNTER — Ambulatory Visit
Admission: RE | Admit: 2022-06-05 | Discharge: 2022-06-05 | Disposition: A | Discharge status: Home / Self Care | Payer: PPO | Source: Ambulatory Visit | Attending: Family Medicine | Admitting: Family Medicine

## 2022-06-05 DIAGNOSIS — K8020 Calculus of gallbladder without cholecystitis without obstruction: Secondary | ICD-10-CM | POA: Diagnosis not present

## 2022-06-05 DIAGNOSIS — R1011 Right upper quadrant pain: Secondary | ICD-10-CM | POA: Diagnosis not present

## 2022-06-19 ENCOUNTER — Other Ambulatory Visit: Payer: Self-pay | Admitting: Internal Medicine

## 2022-06-19 NOTE — Telephone Encounter (Signed)
Last filled 04-27-22 #30 Last OV Acute 05-23-22 No Future [place]

## 2022-06-22 DIAGNOSIS — Z515 Encounter for palliative care: Secondary | ICD-10-CM | POA: Diagnosis not present

## 2022-06-22 DIAGNOSIS — I10 Essential (primary) hypertension: Secondary | ICD-10-CM | POA: Diagnosis not present

## 2022-07-02 ENCOUNTER — Encounter: Payer: Self-pay | Admitting: Internal Medicine

## 2022-07-02 ENCOUNTER — Ambulatory Visit (INDEPENDENT_AMBULATORY_CARE_PROVIDER_SITE_OTHER): Payer: PPO | Admitting: Internal Medicine

## 2022-07-02 VITALS — BP 130/74 | HR 65 | Temp 98.1°F | Ht 67.0 in | Wt 146.0 lb

## 2022-07-02 DIAGNOSIS — R1011 Right upper quadrant pain: Secondary | ICD-10-CM | POA: Insufficient documentation

## 2022-07-02 DIAGNOSIS — K8020 Calculus of gallbladder without cholecystitis without obstruction: Secondary | ICD-10-CM | POA: Insufficient documentation

## 2022-07-02 NOTE — Assessment & Plan Note (Signed)
No related to eating Seems more related to lawn work--suggesting musculoskeletal etiology Nothing to suggest shingles No rib pain Discussed tylenol/salon pas prn

## 2022-07-02 NOTE — Assessment & Plan Note (Signed)
Seem to be asymptomatic Reassured----no reason to limit eating unless he has symptoms after fatty food

## 2022-07-02 NOTE — Progress Notes (Signed)
Subjective:    Patient ID: Leon Estes, male    DOB: 1931-02-12, 88 y.o.   MRN: [id number]  HPI Here with wife--concerns about abdominal pain  Gets RUQ pain at times Only sporadic----usually in the afternoon Riding the lawnmower and using leaf blower seemed to bring it on Salon pas seems to help Not related to eating  No N/V Bowels moving fine  Current Outpatient Medications on File Prior to Visit  Medication Sig Dispense Refill   acetaminophen (TYLENOL) 500 MG tablet Take 1,000 mg by mouth every 6 (six) hours as needed for moderate pain or headache.     aspirin 81 MG EC tablet Take 1 tablet (81 mg total) by mouth daily. Swallow whole. 30 tablet 0   budesonide-formoterol (SYMBICORT) 160-4.5 MCG/ACT inhaler Inhale 2 puffs into the lungs 2 (two) times daily. 10.2 g 5   carvedilol (COREG) 3.125 MG tablet Take 1 tablet (3.125 mg total) by mouth 2 (two) times daily with a meal. 180 tablet 3   furosemide (LASIX) 20 MG tablet Take 1 tablet (20 mg total) by mouth daily. 90 tablet 3   gabapentin (NEURONTIN) 400 MG capsule TAKE 1 CAPSULE BY MOUTH EVERY NIGHT AT BEDTIME 90 capsule 3   losartan (COZAAR) 25 MG tablet Take 0.5 tablets (12.5 mg total) by mouth daily. 45 tablet 3   Multiple Vitamin (MULTIVITAMIN WITH MINERALS) TABS tablet Take 1 tablet by mouth daily.     omeprazole (PRILOSEC) 20 MG capsule Take 1 capsule (20 mg total) by mouth daily. 30 capsule 3   potassium chloride (KLOR-CON) 10 MEQ tablet Take 1 tablet (10 mEq total) by mouth daily. 90 tablet 3   QUEtiapine (SEROQUEL) 25 MG tablet Take 1 tablet (25 mg total) by mouth at bedtime. 30 tablet 11   simvastatin (ZOCOR) 40 MG tablet Take 1 tablet (40 mg total) by mouth at bedtime. 90 tablet 3   tamsulosin (FLOMAX) 0.4 MG CAPS capsule TAKE 1 CAPSULE BY MOUTH ONCE DAILY (Patient taking differently: 0.4 mg daily after supper.) 90 capsule 3   traMADol (ULTRAM) 50 MG tablet TAKE 1 TABLET BY MOUTH BY MOUTH DAILY ASNEEDED FOR PAIN 30  tablet 0   triamcinolone cream (KENALOG) 0.1 % Apply 1 Application topically 2 (two) times daily as needed. 453.6 g 1   No current facility-administered medications on file prior to visit.    Allergies  Allergen Reactions   Augmentin [Amoxicillin-Pot Clavulanate] Rash   Bacitracin-Polymyxin B Swelling and Rash   Benzalkonium Chloride     "Benzalkonium chloride is a quaternary ammonium antiseptic and disinfectant with actions and uses similar to those of other cationic surfactants. It is also used as an antimicrobial preservative for pharmaceutical products."   Neosporin [Neomycin-Bacitracin Zn-Polymyx] Rash   Sulfamethoxazole-Trimethoprim Rash   Terbinafine And Related Rash    Past Medical History:  Diagnosis Date   Asthma    BPH (benign prostatic hypertrophy)    Cataract    Chronic renal disease, stage III (HCC)    Chronic sinusitis    COPD (chronic obstructive pulmonary disease) (HCC)    Coronary artery disease    2 stents   Gall stones    Hearing loss    DOES NOT WEAR HEARING AIDS   High cholesterol    Hypertension    Lactic acid acidosis 10/12/2021   Macular degeneration    right eye   Rash 12/04/2021   Septic shock ([place]) 10/10/2021    Past Surgical History:  Procedure Laterality Date  APPENDECTOMY     CORONARY ANGIOPLASTY WITH STENT PLACEMENT  1998   2 stents   TRANSURETHRAL RESECTION OF PROSTATE      Family History  Problem Relation Age of Onset   Diabetes Father    Heart disease Brother     Social History   Socioeconomic History   Marital status: Married    Spouse name: Not on file   Number of children: 4   Years of education: Not on file   Highest education level: Not on file  Occupational History   Occupation: Drove truck and concrete work  Tobacco Use   Smoking status: Former    Types: Cigarettes    Quit date: 08/13/2005    Years since quitting: 16.8    Passive exposure: Past   Smokeless tobacco: Current    Types: Chew   Tobacco comments:     discussed stopping this (only once in a while)  Vaping Use   Vaping Use: Never used  Substance and Sexual Activity   Alcohol use: No   Drug use: No   Sexual activity: Not on file  Other Topics Concern   Not on file  Social History Narrative   No living will   Wife, then [place] daughter should make decisions   Would accept resuscitation   Would probably accept tube feeds   Social Determinants of Health   Financial Resource Strain: Not on file  Food Insecurity: Not on file  Transportation Needs: Not on file  Physical Activity: Not on file  Stress: Not on file  Social Connections: Not on file  Intimate Partner Violence: Not on file   Review of Systems Appetite is good Weight is fairly stable No cough or SOB     Objective:   Physical Exam Constitutional:      Appearance: Normal appearance.  Pulmonary:     Effort: Pulmonary effort is normal.     Breath sounds: Normal breath sounds. No wheezing or rales.     Comments: No rib tenderness Abdominal:     Palpations: Abdomen is soft.     Tenderness: There is no abdominal tenderness.     Comments: Murphy's sign absent  Musculoskeletal:     Cervical back: Neck supple.  Lymphadenopathy:     Cervical: No cervical adenopathy.  Skin:    Comments: No rash on flank  Neurological:     Mental Status: He is alert.            Assessment & Plan:

## 2022-08-01 ENCOUNTER — Emergency Department (HOSPITAL_COMMUNITY): Payer: PPO

## 2022-08-01 ENCOUNTER — Other Ambulatory Visit: Payer: Self-pay

## 2022-08-01 ENCOUNTER — Encounter (HOSPITAL_COMMUNITY): Payer: Self-pay

## 2022-08-01 ENCOUNTER — Inpatient Hospital Stay (HOSPITAL_COMMUNITY)
Admission: EM | Admit: 2022-08-01 | Discharge: 2022-08-04 | DRG: 445 | Disposition: A | Discharge status: Home Health | Payer: PPO | Attending: Osteopathic Medicine | Admitting: Osteopathic Medicine

## 2022-08-01 DIAGNOSIS — N281 Cyst of kidney, acquired: Secondary | ICD-10-CM | POA: Diagnosis not present

## 2022-08-01 DIAGNOSIS — Z7951 Long term (current) use of inhaled steroids: Secondary | ICD-10-CM

## 2022-08-01 DIAGNOSIS — J449 Chronic obstructive pulmonary disease, unspecified: Secondary | ICD-10-CM | POA: Diagnosis not present

## 2022-08-01 DIAGNOSIS — N1831 Chronic kidney disease, stage 3a: Secondary | ICD-10-CM | POA: Diagnosis not present

## 2022-08-01 DIAGNOSIS — Z720 Tobacco use: Secondary | ICD-10-CM

## 2022-08-01 DIAGNOSIS — I2510 Atherosclerotic heart disease of native coronary artery without angina pectoris: Secondary | ICD-10-CM | POA: Diagnosis not present

## 2022-08-01 DIAGNOSIS — K8020 Calculus of gallbladder without cholecystitis without obstruction: Secondary | ICD-10-CM | POA: Diagnosis not present

## 2022-08-01 DIAGNOSIS — Z8619 Personal history of other infectious and parasitic diseases: Secondary | ICD-10-CM | POA: Diagnosis not present

## 2022-08-01 DIAGNOSIS — I480 Paroxysmal atrial fibrillation: Secondary | ICD-10-CM | POA: Diagnosis present

## 2022-08-01 DIAGNOSIS — D649 Anemia, unspecified: Secondary | ICD-10-CM | POA: Diagnosis present

## 2022-08-01 DIAGNOSIS — Z880 Allergy status to penicillin: Secondary | ICD-10-CM | POA: Diagnosis not present

## 2022-08-01 DIAGNOSIS — K819 Cholecystitis, unspecified: Secondary | ICD-10-CM

## 2022-08-01 DIAGNOSIS — E7800 Pure hypercholesterolemia, unspecified: Secondary | ICD-10-CM | POA: Diagnosis present

## 2022-08-01 DIAGNOSIS — R54 Age-related physical debility: Secondary | ICD-10-CM | POA: Diagnosis present

## 2022-08-01 DIAGNOSIS — I130 Hypertensive heart and chronic kidney disease with heart failure and stage 1 through stage 4 chronic kidney disease, or unspecified chronic kidney disease: Secondary | ICD-10-CM | POA: Diagnosis not present

## 2022-08-01 DIAGNOSIS — Z833 Family history of diabetes mellitus: Secondary | ICD-10-CM

## 2022-08-01 DIAGNOSIS — K8050 Calculus of bile duct without cholangitis or cholecystitis without obstruction: Secondary | ICD-10-CM | POA: Diagnosis not present

## 2022-08-01 DIAGNOSIS — K2980 Duodenitis without bleeding: Secondary | ICD-10-CM | POA: Diagnosis not present

## 2022-08-01 DIAGNOSIS — H9190 Unspecified hearing loss, unspecified ear: Secondary | ICD-10-CM | POA: Diagnosis not present

## 2022-08-01 DIAGNOSIS — K760 Fatty (change of) liver, not elsewhere classified: Secondary | ICD-10-CM | POA: Diagnosis present

## 2022-08-01 DIAGNOSIS — Z79899 Other long term (current) drug therapy: Secondary | ICD-10-CM

## 2022-08-01 DIAGNOSIS — Z66 Do not resuscitate: Secondary | ICD-10-CM | POA: Diagnosis not present

## 2022-08-01 DIAGNOSIS — E785 Hyperlipidemia, unspecified: Secondary | ICD-10-CM | POA: Diagnosis present

## 2022-08-01 DIAGNOSIS — Z955 Presence of coronary angioplasty implant and graft: Secondary | ICD-10-CM

## 2022-08-01 DIAGNOSIS — R935 Abnormal findings on diagnostic imaging of other abdominal regions, including retroperitoneum: Secondary | ICD-10-CM | POA: Diagnosis not present

## 2022-08-01 DIAGNOSIS — R1013 Epigastric pain: Secondary | ICD-10-CM | POA: Diagnosis not present

## 2022-08-01 DIAGNOSIS — I5042 Chronic combined systolic (congestive) and diastolic (congestive) heart failure: Secondary | ICD-10-CM | POA: Diagnosis present

## 2022-08-01 DIAGNOSIS — R1011 Right upper quadrant pain: Principal | ICD-10-CM

## 2022-08-01 DIAGNOSIS — Z8249 Family history of ischemic heart disease and other diseases of the circulatory system: Secondary | ICD-10-CM

## 2022-08-01 DIAGNOSIS — I25119 Atherosclerotic heart disease of native coronary artery with unspecified angina pectoris: Secondary | ICD-10-CM | POA: Diagnosis present

## 2022-08-01 DIAGNOSIS — N400 Enlarged prostate without lower urinary tract symptoms: Secondary | ICD-10-CM | POA: Diagnosis present

## 2022-08-01 DIAGNOSIS — Z7982 Long term (current) use of aspirin: Secondary | ICD-10-CM

## 2022-08-01 DIAGNOSIS — I10 Essential (primary) hypertension: Secondary | ICD-10-CM | POA: Diagnosis present

## 2022-08-01 DIAGNOSIS — Z882 Allergy status to sulfonamides status: Secondary | ICD-10-CM | POA: Diagnosis not present

## 2022-08-01 DIAGNOSIS — K8012 Calculus of gallbladder with acute and chronic cholecystitis without obstruction: Principal | ICD-10-CM | POA: Diagnosis present

## 2022-08-01 DIAGNOSIS — Z888 Allergy status to other drugs, medicaments and biological substances status: Secondary | ICD-10-CM

## 2022-08-01 DIAGNOSIS — K811 Chronic cholecystitis: Secondary | ICD-10-CM | POA: Diagnosis present

## 2022-08-01 DIAGNOSIS — R1111 Vomiting without nausea: Secondary | ICD-10-CM | POA: Diagnosis not present

## 2022-08-01 DIAGNOSIS — J329 Chronic sinusitis, unspecified: Secondary | ICD-10-CM | POA: Diagnosis present

## 2022-08-01 LAB — TROPONIN I (HIGH SENSITIVITY)
Troponin I (High Sensitivity): 12 ng/L (ref <18)
Troponin I (High Sensitivity): 17 ng/L (ref <18)

## 2022-08-01 LAB — COMPREHENSIVE METABOLIC PANEL
ALT: 16 U/L (ref 0–44)
AST: 25 U/L (ref 15–41)
Albumin: 3.9 g/dL (ref 3.5–5.0)
Alkaline Phosphatase: 69 U/L (ref 38–126)
Anion gap: 11 (ref 5–15)
BUN: 11 mg/dL (ref 8–23)
CO2: 24 mmol/L (ref 22–32)
Calcium: 9.5 mg/dL (ref 8.9–10.3)
Chloride: 103 mmol/L (ref 98–111)
Creatinine, Ser: 1.03 mg/dL (ref 0.61–1.24)
GFR, Estimated: >60 mL/min (ref >60)
Glucose, Bld: 137 mg/dL — ABNORMAL HIGH (ref 70–99)
Potassium: 3.6 mmol/L (ref 3.5–5.1)
Sodium: 138 mmol/L (ref 135–145)
Total Bilirubin: 0.7 mg/dL (ref 0.3–1.2)
Total Protein: 7.3 g/dL (ref 6.5–8.1)

## 2022-08-01 LAB — URINALYSIS, ROUTINE W REFLEX MICROSCOPIC
Bilirubin Urine: NEGATIVE
Glucose, UA: NEGATIVE mg/dL
Ketones, ur: 5 mg/dL — AB
Leukocytes,Ua: NEGATIVE
Nitrite: NEGATIVE
Protein, ur: >=300 mg/dL — AB
Specific Gravity, Urine: 1.017 (ref 1.005–1.030)
pH: 8 (ref 5.0–8.0)

## 2022-08-01 LAB — CBC
HCT: 38.6 % — ABNORMAL LOW (ref 39.0–52.0)
Hemoglobin: 11.3 g/dL — ABNORMAL LOW (ref 13.0–17.0)
MCH: 25.6 pg — ABNORMAL LOW (ref 26.0–34.0)
MCHC: 29.3 g/dL — ABNORMAL LOW (ref 30.0–36.0)
MCV: 87.5 fL (ref 80.0–100.0)
Platelets: 253 10*3/uL (ref 150–400)
RBC: 4.41 MIL/uL (ref 4.22–5.81)
RDW: 17.2 % — ABNORMAL HIGH (ref 11.5–15.5)
WBC: 13.4 10*3/uL — ABNORMAL HIGH (ref 4.0–10.5)
nRBC: 0 % (ref 0.0–0.2)

## 2022-08-01 LAB — LIPASE, BLOOD: Lipase: 34 U/L (ref 11–51)

## 2022-08-01 MED ORDER — BISACODYL 10 MG RE SUPP: 10.0000 mg | Freq: Every day | RECTAL | Status: DC | PRN

## 2022-08-01 MED ORDER — SIMVASTATIN 20 MG PO TABS
40.0000 mg | ORAL_TABLET | Freq: Every day | ORAL | Status: DC
  Administered 2022-08-02 – 2022-08-03 (×2): 40 mg via ORAL
  Filled 2022-08-01 (×2): qty 2

## 2022-08-01 MED ORDER — METRONIDAZOLE 500 MG/100ML IV SOLN
500.0000 mg | Freq: Two times a day (BID) | INTRAVENOUS | Status: DC
  Administered 2022-08-01 – 2022-08-04 (×6): 500 mg via INTRAVENOUS
  Filled 2022-08-01 (×6): qty 100

## 2022-08-01 MED ORDER — LOSARTAN POTASSIUM 25 MG PO TABS
12.5000 mg | ORAL_TABLET | ORAL | Status: AC
  Administered 2022-08-01: 12.5 mg via ORAL
  Filled 2022-08-01: qty 0.5

## 2022-08-01 MED ORDER — MOMETASONE FURO-FORMOTEROL FUM 200-5 MCG/ACT IN AERO
2.0000 | INHALATION_SPRAY | Freq: Two times a day (BID) | RESPIRATORY_TRACT | Status: DC
  Administered 2022-08-02 – 2022-08-03 (×4): 2 via RESPIRATORY_TRACT
  Filled 2022-08-01: qty 8.8

## 2022-08-01 MED ORDER — GADOBUTROL 1 MMOL/ML IV SOLN
6.5000 mL | Freq: Once | INTRAVENOUS | Status: AC | PRN
  Administered 2022-08-01: 6.5 mL via INTRAVENOUS

## 2022-08-01 MED ORDER — TAMSULOSIN HCL 0.4 MG PO CAPS
0.4000 mg | ORAL_CAPSULE | Freq: Every day | ORAL | Status: DC
  Administered 2022-08-02 – 2022-08-03 (×2): 0.4 mg via ORAL
  Filled 2022-08-01 (×2): qty 1

## 2022-08-01 MED ORDER — LOSARTAN POTASSIUM 25 MG PO TABS
12.5000 mg | ORAL_TABLET | Freq: Every day | ORAL | Status: DC
  Administered 2022-08-02: 12.5 mg via ORAL
  Filled 2022-08-01: qty 1

## 2022-08-01 MED ORDER — SODIUM CHLORIDE 0.9 % IV SOLN
2.0000 g | INTRAVENOUS | Status: DC
  Administered 2022-08-01 – 2022-08-03 (×3): 2 g via INTRAVENOUS
  Filled 2022-08-01 (×3): qty 20

## 2022-08-01 MED ORDER — ACETAMINOPHEN 325 MG PO TABS
650.0000 mg | ORAL_TABLET | Freq: Four times a day (QID) | ORAL | Status: DC | PRN
  Administered 2022-08-02: 650 mg via ORAL
  Filled 2022-08-01: qty 2

## 2022-08-01 MED ORDER — ACETAMINOPHEN 650 MG RE SUPP: 650.0000 mg | Freq: Four times a day (QID) | RECTAL | Status: DC | PRN

## 2022-08-01 MED ORDER — PROCHLORPERAZINE EDISYLATE 10 MG/2ML IJ SOLN: 10.0000 mg | Freq: Four times a day (QID) | INTRAMUSCULAR | Status: DC | PRN

## 2022-08-01 MED ORDER — FUROSEMIDE 20 MG PO TABS
20.0000 mg | ORAL_TABLET | Freq: Every day | ORAL | Status: DC
  Administered 2022-08-02: 20 mg via ORAL
  Filled 2022-08-01: qty 1

## 2022-08-01 MED ORDER — ONDANSETRON 4 MG PO TBDP
4.0000 mg | ORAL_TABLET | Freq: Once | ORAL | Status: AC | PRN
  Administered 2022-08-01: 4 mg via ORAL
  Filled 2022-08-01: qty 1

## 2022-08-01 MED ORDER — CARVEDILOL 3.125 MG PO TABS
3.1250 mg | ORAL_TABLET | Freq: Two times a day (BID) | ORAL | Status: DC
  Administered 2022-08-02 – 2022-08-04 (×5): 3.125 mg via ORAL
  Filled 2022-08-01 (×5): qty 1

## 2022-08-01 MED ORDER — MORPHINE SULFATE (PF) 2 MG/ML IV SOLN: 1.0000 mg | INTRAVENOUS | Status: DC | PRN

## 2022-08-01 MED ORDER — HEPARIN SODIUM (PORCINE) 5000 UNIT/ML IJ SOLN
5000.0000 [IU] | Freq: Three times a day (TID) | INTRAMUSCULAR | Status: DC
  Administered 2022-08-01 – 2022-08-04 (×9): 5000 [IU] via SUBCUTANEOUS
  Filled 2022-08-01 (×8): qty 1

## 2022-08-01 MED ORDER — CARVEDILOL 3.125 MG PO TABS
3.1250 mg | ORAL_TABLET | ORAL | Status: AC
  Administered 2022-08-01: 3.125 mg via ORAL
  Filled 2022-08-01: qty 1

## 2022-08-01 MED ORDER — MORPHINE SULFATE (PF) 4 MG/ML IV SOLN
4.0000 mg | Freq: Once | INTRAVENOUS | Status: AC
  Administered 2022-08-01: 4 mg via INTRAVENOUS
  Filled 2022-08-01: qty 1

## 2022-08-01 MED ORDER — SENNOSIDES-DOCUSATE SODIUM 8.6-50 MG PO TABS: 1.0000 | ORAL_TABLET | Freq: Every evening | ORAL | Status: DC | PRN

## 2022-08-01 MED ORDER — ONDANSETRON HCL 4 MG/2ML IJ SOLN
4.0000 mg | Freq: Once | INTRAMUSCULAR | Status: AC
  Administered 2022-08-01: 4 mg via INTRAVENOUS
  Filled 2022-08-01: qty 2

## 2022-08-01 NOTE — Assessment & Plan Note (Signed)
Continue simvastatin. 

## 2022-08-01 NOTE — ED Provider Notes (Signed)
[facility] EMERGENCY DEPARTMENT Provider Note   CSN: [id number] Arrival date & time: 08/01/22  4097     History  Chief Complaint  Patient presents with   Abdominal Pain    Leon Estes is a 88 y.o. male.  HPI     This is an 88 year old male with a history of coronary artery disease, gallstones, BPH, chronic renal disease who presents with epigastric pain.  Reports acute onset of epigastric pain this morning around 4 AM.  He describes it as sharp and nonradiating.  He states that he took Pepto-Bismol and acutely vomited.    Denies changes in bowels.  Denies fevers.  Is unclear whether he has had recurrent pain like this in the past.  Does not related to eating.  REcent hx of RUQ pain.  [place] with gallstones.  Home Medications Prior to Admission medications   Medication Sig Start Date End Date Taking? Authorizing Provider  acetaminophen (TYLENOL) 500 MG tablet Take 1,000 mg by mouth every 6 (six) hours as needed for moderate pain or headache.    [provider]  aspirin 81 MG EC tablet Take 1 tablet (81 mg total) by mouth daily. Swallow whole. 10/18/21   Darliss Cheney, MD  budesonide-formoterol (SYMBICORT) 160-4.5 MCG/ACT inhaler Inhale 2 puffs into the lungs 2 (two) times daily. 10/23/21   Venia Carbon, MD  carvedilol (COREG) 3.125 MG tablet Take 1 tablet (3.125 mg total) by mouth 2 (two) times daily with a meal. 11/07/21   Imogene Burn, PA-C  furosemide (LASIX) 20 MG tablet Take 1 tablet (20 mg total) by mouth daily. 11/07/21   Imogene Burn, PA-C  gabapentin (NEURONTIN) 400 MG capsule TAKE 1 CAPSULE BY MOUTH EVERY NIGHT AT BEDTIME 11/28/21   Venia Carbon, MD  losartan (COZAAR) 25 MG tablet Take 0.5 tablets (12.5 mg total) by mouth daily. 11/07/21   Imogene Burn, PA-C  Multiple Vitamin (MULTIVITAMIN WITH MINERALS) TABS tablet Take 1 tablet by mouth daily. 01/23/21   Georgette Shell, MD  omeprazole (PRILOSEC) 20 MG capsule Take 1  capsule (20 mg total) by mouth daily. 10/20/21   Kennyth Arnold, FNP  potassium chloride (KLOR-CON) 10 MEQ tablet Take 1 tablet (10 mEq total) by mouth daily. 11/07/21   Imogene Burn, PA-C  QUEtiapine (SEROQUEL) 25 MG tablet Take 1 tablet (25 mg total) by mouth at bedtime. 08/18/21   Venia Carbon, MD  simvastatin (ZOCOR) 40 MG tablet Take 1 tablet (40 mg total) by mouth at bedtime. 10/23/21   Venia Carbon, MD  tamsulosin (FLOMAX) 0.4 MG CAPS capsule TAKE 1 CAPSULE BY MOUTH ONCE DAILY Patient taking differently: 0.4 mg daily after supper. 08/07/21   Venia Carbon, MD  traMADol (ULTRAM) 50 MG tablet TAKE 1 TABLET BY MOUTH BY MOUTH DAILY ASNEEDED FOR PAIN 06/19/22   Viviana Simpler I, MD  triamcinolone cream (KENALOG) 0.1 % Apply 1 Application topically 2 (two) times daily as needed. 04/02/22   Copland, Frederico Hamman, MD      Allergies    Augmentin [amoxicillin-pot clavulanate], Bacitracin-polymyxin b, Benzalkonium chloride, Neosporin [neomycin-bacitracin zn-polymyx], Sulfamethoxazole-trimethoprim, and Terbinafine and related    Review of Systems   Review of Systems  Constitutional:  Negative for fever.  Gastrointestinal:  Positive for abdominal pain, nausea and vomiting.  All other systems reviewed and are negative.   Physical Exam Updated Vital Signs BP (!) 182/96 (BP Location: Right Arm)   Pulse 88   Temp 98 F (  36.7 C) (Oral)   Resp 18   Ht 1.676 m ('5\' 6"'$ )   Wt 65.8 kg   SpO2 96%   BMI 23.40 kg/m  Physical Exam Vitals and nursing note reviewed.  Constitutional:      Appearance: He is well-developed.     Comments: Elderly, chronically ill-appearing but nontoxic  HENT:     Head: Normocephalic and atraumatic.  Eyes:     Pupils: Pupils are equal, round, and reactive to light.  Cardiovascular:     Rate and Rhythm: Normal rate and regular rhythm.     Heart sounds: Normal heart sounds. No murmur heard. Pulmonary:     Effort: Pulmonary effort is normal. No respiratory  distress.     Breath sounds: Normal breath sounds. No wheezing.  Abdominal:     General: Bowel sounds are normal.     Palpations: Abdomen is soft.     Tenderness: There is abdominal tenderness. There is no guarding or rebound.  Musculoskeletal:     Cervical back: Neck supple.  Lymphadenopathy:     Cervical: No cervical adenopathy.  Skin:    General: Skin is warm and dry.  Neurological:     Mental Status: He is alert and oriented to person, place, and time.  Psychiatric:        Mood and Affect: Mood normal.     ED Results / Procedures / Treatments   Labs (all labs ordered are listed, but only abnormal results are displayed) Labs Reviewed  COMPREHENSIVE METABOLIC PANEL - Abnormal; Notable for the following components:      Result Value   Glucose, Bld 137 (*)    All other components within normal limits  CBC - Abnormal; Notable for the following components:   WBC 13.4 (*)    Hemoglobin 11.3 (*)    HCT 38.6 (*)    MCH 25.6 (*)    MCHC 29.3 (*)    RDW 17.2 (*)    All other components within normal limits  LIPASE, BLOOD  URINALYSIS, ROUTINE W REFLEX MICROSCOPIC  TROPONIN I (HIGH SENSITIVITY)  TROPONIN I (HIGH SENSITIVITY)    EKG EKG Interpretation  Date/Time:  Wednesday August 01 2022 09:09:12 EST Ventricular Rate:  75 PR Interval:  250 QRS Duration: 148 QT Interval:  442 QTC Calculation: 493 R Axis:   24 Text Interpretation: Atrial fibrillation Premature ventricular complexes Right bundle branch block Cannot rule out Inferior infarct , age undetermined Abnormal ECG When compared with ECG of 10-Oct-2021 21:01, PREVIOUS ECG IS PRESENT Confirmed by Thayer Jew [phone]) on 08/01/2022 9:15:33 AM  Radiology US Abdomen Limited RUQ (LIVER/GB)  Result Date: 08/01/2022 CLINICAL DATA:  Epigastric pain. EXAM: ULTRASOUND ABDOMEN LIMITED RIGHT UPPER QUADRANT COMPARISON:  06/05/2022 FINDINGS: Gallbladder: The gallbladder is not confidently visualized and may be  decompressed. Common bile duct: Diameter: 7 mm. There is a shadowing filling defect within the common bile duct which measures 1.2 x 0.9 cm. Liver: Increased parenchymal echogenicity. No focal lesion identified. Portal vein is patent on color Doppler imaging with normal direction of blood flow towards the liver. Other: None. IMPRESSION: 1. Decompressed gallbladder is not well visualized. 2. Mildly increased caliber of the CBD measures 7 mm. Shadowing filling defect within the common bile duct is indeterminate. If there are clinical signs of choledocholithiasis consider more definitive characterization with MRI/MRCP. 3. Hepatic steatosis. Electronically Signed   By: Kerby Moors M.D.   On: 08/01/2022 11:07    Procedures Procedures    Medications Ordered in ED Medications  ondansetron (ZOFRAN-ODT) disintegrating tablet 4 mg (has no administration in time range)  morphine (PF) 4 MG/ML injection 4 mg (4 mg Intravenous Given 08/01/22 1011)  ondansetron (ZOFRAN) injection 4 mg (4 mg Intravenous Given 08/01/22 1010)  morphine (PF) 4 MG/ML injection 4 mg (4 mg Intravenous Given 08/01/22 1241)    ED Course/ Medical Decision Making/ A&P                           Medical Decision Making Amount and/or Complexity of Data Reviewed Labs: ordered. Radiology: ordered.  Risk Prescription drug management.   This patient presents to the ED for concern of abdominal pain, this involves an extensive number of treatment options, and is a complaint that carries with it a high risk of complications and morbidity.  I considered the following differential and admission for this acute, potentially life threatening condition.  The differential diagnosis includes gastritis, pancreatitis, cholecystitis, atypical ACS  MDM:    This is a 88 yo male who presents with abdominal pain.  Nontoxic.  Hypertensive but otherwise VS WNL.  Epigastric TTP.  Labworks with leukocytosis but no LFT or lipase derrangement.  RUQ ordered  with primary concern for cholecystitis.  Pain and nausea meds given.  RUQ with slightly dilated CBD and shadowing.  ?choledocolithiasis.  MRCP ordered.  Patient signed out to Dr. Tomi Bamberger.  (Labs, imaging, consults)  Labs: I Ordered, and personally interpreted labs.  The pertinent results include:  CBC, CMP, lilpase  Imaging Studies ordered: I ordered imaging studies including RUQ [place] I independently visualized and interpreted imaging. I agree with the radiologist interpretation  Additional history obtained from chart review.  External records from outside source obtained and reviewed including recent imaging and RUQ workup  Cardiac Monitoring: The patient was maintained on a cardiac monitor.  I personally viewed and interpreted the cardiac monitored which showed an underlying rhythm of: NSR  Reevaluation: After the interventions noted above, I reevaluated the patient and found that they have :stayed the same  Social Determinants of Health: live independently  Disposition:  pending  Co morbidities that complicate the patient evaluation  Past Medical History:  Diagnosis Date   Asthma    BPH (benign prostatic hypertrophy)    Cataract    Chronic renal disease, stage III (HCC)    Chronic sinusitis    COPD (chronic obstructive pulmonary disease) (HCC)    Coronary artery disease    2 stents   Gall stones    Hearing loss    DOES NOT WEAR HEARING AIDS   High cholesterol    Hypertension    Lactic acid acidosis 10/12/2021   Macular degeneration    right eye   Rash 12/04/2021   Septic shock ([place]) 10/10/2021     Medicines Meds ordered this encounter  Medications   ondansetron (ZOFRAN-ODT) disintegrating tablet 4 mg   morphine (PF) 4 MG/ML injection 4 mg   ondansetron (ZOFRAN) injection 4 mg   morphine (PF) 4 MG/ML injection 4 mg    I have reviewed the patients home medicines and have made adjustments as needed  Problem List / ED Course: Problem List Items Addressed This Visit    None               Final Clinical Impression(s) / ED Diagnoses Final diagnoses:  None    Rx / DC Orders ED Discharge Orders     None         Trevone Prestwood, Loma Sousa  F, MD 08/01/22 1311

## 2022-08-01 NOTE — ED Notes (Signed)
Admitting Provider at bedside. 

## 2022-08-01 NOTE — Consult Note (Signed)
Leon Estes Leon Estes Nov 16, 1930  [id number].    Requesting MD: Dr. Philip Estes Chief Complaint/Reason for Consult: abdominal pain, cholecystitis  HPI:  Leon Estes is an 88 yo male who presented to the ED today with acute onset abdominal pain. He says the pain began this morning and was in the epigastric area. He was not able to give me further details regarding his symptoms and no family is present at bedside. Per chart review, his pain began at about 4am, and he took [place] and then vomited. He has previously reported intermittent RUQ pain to his PCP, and prior ultrasounds have demonstrated cholelithiasis. Labs in the ED are significant for a WBC of 13. LFTs are normal. RUQ [place] was done but the gallbladder was poorly visualized. There was concern for a filling defect in the common bile duct on [place], so an MRCP was done. This showed a small gallbladder (interpreted as a remnant gallbladder following cholecystectomy) with stones in the neck and cystic duct, and mild surrounding fluid. There was no evidence of choledocholithiasis. General surgery was consulted.  At the time of my exam the patient said is pain is improving. He is mildly somnolent. When asked about prior surgeries he says he had an appendectomy many years ago but denies ever having a cholecystectomy.  He has multiple medical comorbidities including CAD (s/p stenting 1997), a-fib (not on anticoagulation due to anemia and fall risk), HTN, CKD, COPD and recurrent C diff colitis. His most recent echo on 12/19/21 showed an EF of 45-50% with global LV hypokinesis.   ROS: Review of Systems  Gastrointestinal:  Positive for abdominal pain, nausea and vomiting.    Family History  Problem Relation Age of Onset   Diabetes Father    Heart disease Brother     Past Medical History:  Diagnosis Date   Asthma    BPH (benign prostatic hypertrophy)    Cataract    Chronic renal disease, stage III (HCC)    Chronic sinusitis    COPD (chronic  obstructive pulmonary disease) (HCC)    Coronary artery disease    2 stents   Gall stones    Hearing loss    DOES NOT WEAR HEARING AIDS   High cholesterol    Hypertension    Lactic acid acidosis 10/12/2021   Macular degeneration    right eye   Rash 12/04/2021   Septic shock ([place]) 10/10/2021    Past Surgical History:  Procedure Laterality Date   APPENDECTOMY     CORONARY ANGIOPLASTY WITH STENT PLACEMENT  1998   2 stents   TRANSURETHRAL RESECTION OF PROSTATE      Social History:  reports that he quit smoking about 16 years ago. His smoking use included cigarettes. He has been exposed to tobacco smoke. His smokeless tobacco use includes chew. He reports that he does not drink alcohol and does not use drugs.  Allergies:  Allergies  Allergen Reactions   Augmentin [Amoxicillin-Pot Clavulanate] Rash   Bacitracin-Polymyxin B Swelling and Rash   Benzalkonium Chloride     "Benzalkonium chloride is a quaternary ammonium antiseptic and disinfectant with actions and uses similar to those of other cationic surfactants. It is also used as an antimicrobial preservative for pharmaceutical products."   Neosporin [Neomycin-Bacitracin Zn-Polymyx] Rash   Sulfamethoxazole-Trimethoprim Rash   Terbinafine And Related Rash    (Not in a hospital admission)    Physical Exam: Blood pressure (!) 201/100, pulse 93, temperature 98.6 F (37 C), temperature source Oral, resp. rate 16,  height '5\' 6"'$  (1.676 m), weight 65.8 kg, SpO2 96 %. General: resting comfortably, appears stated age, no apparent distress Neurological: mildly confused HEENT: normocephalic, atraumatic, oropharynx clear, no scleral icterus Respiratory: normal work of breathing on room air, symmetric chest wall expansion Abdomen: soft, nondistended, mild focal tenderness to palpation in the epigastric area. No surgical scars are present in the upper abdomen or periumbilical area. There is a faint scar in the RLQ. Extremities: warm and  well-perfused, no deformities, moving all extremities spontaneously Skin: warm and dry, no jaundice, no rashes or lesions   Results for orders placed or performed during the hospital encounter of 08/01/22 (from the past 48 hour(s))  Lipase, blood     Status: None   Collection Time: 08/01/22  8:49 AM  Result Value Ref Range   Lipase 34 11 - 51 U/L    Comment: Performed at [facility], [address] [place], [place] [address]  Comprehensive metabolic panel     Status: Abnormal   Collection Time: 08/01/22  8:49 AM  Result Value Ref Range   Sodium 138 135 - 145 mmol/L   Potassium 3.6 3.5 - 5.1 mmol/L   Chloride 103 98 - 111 mmol/L   CO2 24 22 - 32 mmol/L   Glucose, Bld 137 (H) 70 - 99 mg/dL    Comment: Glucose reference range applies only to samples taken after fasting for at least 8 hours.   BUN 11 8 - 23 mg/dL   Creatinine, Ser 1.03 0.61 - 1.24 mg/dL   Calcium 9.5 8.9 - 10.3 mg/dL   Total Protein 7.3 6.5 - 8.1 g/dL   Albumin 3.9 3.5 - 5.0 g/dL   AST 25 15 - 41 U/L   ALT 16 0 - 44 U/L   Alkaline Phosphatase 69 38 - 126 U/L   Total Bilirubin 0.7 0.3 - 1.2 mg/dL   GFR, Estimated >60 >60 mL/min    Comment: (NOTE) Calculated using the CKD-EPI Creatinine Equation (2021)    Anion gap 11 5 - 15    Comment: Performed at [facility] [address], [place], [place] [address]  CBC     Status: Abnormal   Collection Time: 08/01/22  8:49 AM  Result Value Ref Range   WBC 13.4 (H) 4.0 - 10.5 K/uL   RBC 4.41 4.22 - 5.81 MIL/uL   Hemoglobin 11.3 (L) 13.0 - 17.0 g/dL   HCT 38.6 (L) 39.0 - 52.0 %   MCV 87.5 80.0 - 100.0 fL   MCH 25.6 (L) 26.0 - 34.0 pg   MCHC 29.3 (L) 30.0 - 36.0 g/dL   RDW 17.2 (H) 11.5 - 15.5 %   Platelets 253 150 - 400 K/uL   nRBC 0.0 0.0 - 0.2 %    Comment: Performed at [facility], [place] [address] [place], [place] [address]  Troponin I (High Sensitivity)     Status: None   Collection Time: 08/01/22  8:49 AM  Result Value Ref Range   Troponin  I (High Sensitivity) 12 <18 ng/L    Comment: (NOTE) Elevated high sensitivity troponin I (hsTnI) values and significant  changes across serial measurements may suggest ACS but many other  chronic and acute conditions are known to elevate hsTnI results.  Refer to the "Links" section for chest pain algorithms and additional  guidance. Performed at [facility], [place] [address], [place], [place] [address]   Troponin I (High Sensitivity)     Status: None  Collection Time: 08/01/22  2:51 PM  Result Value Ref Range   Troponin I (High Sensitivity) 17 <18 ng/L    Comment: (NOTE) Elevated high sensitivity troponin I (hsTnI) values and significant  changes across serial measurements may suggest ACS but many other  chronic and acute conditions are known to elevate hsTnI results.  Refer to the "Links" section for chest pain algorithms and additional  guidance. Performed at [facility], [place] [address], [place], [place] [address]    MR ABDOMEN MRCP W WO CONTAST  Result Date: 08/01/2022 CLINICAL DATA:  Cholelithiasis EXAM: MRI ABDOMEN WITHOUT AND WITH CONTRAST (INCLUDING MRCP) TECHNIQUE: Multiplanar multisequence MR imaging of the abdomen was performed both before and after the administration of intravenous contrast. Heavily T2-weighted images of the biliary and pancreatic ducts were obtained, and three-dimensional MRCP images were rendered by post processing. CONTRAST:  6.14m GADAVIST GADOBUTROL 1 MMOL/ML IV SOLN COMPARISON:  Right upper quadrant ultrasound, 08/01/2022, CT abdomen pelvis, 10/10/2021 FINDINGS: Lower chest: No acute abnormality. Hepatobiliary: No solid liver abnormality is seen. Status post cholecystectomy. Small gallbladder remnant with adjacent gallstones in the gallbladder neck and cystic duct measuring 0.7 and 0.8 cm (series 3, image 26). Intra and extrahepatic biliary ductal dilatation is unchanged comparison to prior CT, the common bile duct measuring up to 0.7 cm in  caliber. No obstructing calculus or other abnormality identified to the ampulla. Pancreas: Unremarkable. No pancreatic ductal dilatation or surrounding inflammatory changes. Spleen: Normal in size without significant abnormality. Adrenals/Urinary Tract: Adrenal glands are unremarkable. Simple, benign bilateral renal cortical cysts, for which no further follow-up or characterization is required. Kidneys are otherwise normal, without renal calculi, solid lesion, or hydronephrosis. Stomach/Bowel: Stomach is within normal limits. No evidence of bowel wall thickening, distention, or inflammatory changes. Vascular/Lymphatic: No significant vascular findings are present. No enlarged abdominal lymph nodes. Other: No abdominal wall hernia or abnormality. Small volume inflammatory fluid adjacent to the gallbladder remnant and descending duodenum (series 4, image 19). Musculoskeletal: No acute or significant osseous findings. IMPRESSION: 1. Status post cholecystectomy. Small gallbladder remnant with adjacent gallstones in the gallbladder neck and cystic duct measuring 0.7 and 0.8 cm. 2. Intra and extrahepatic biliary ductal dilatation is unchanged comparison to prior CT, the common bile duct measuring up to 0.7 cm in caliber. No obstructing calculus or other abnormality identified to the ampulla. 3. Small volume inflammatory fluid adjacent to the gallbladder remnant and descending duodenum, of uncertain etiology. Electronically Signed   By: ADelanna AhmadiM.D.   On: 08/01/2022 16:16   [place]Abdomen Limited RUQ (LIVER/GB)  Result Date: 08/01/2022 CLINICAL DATA:  Epigastric pain. EXAM: ULTRASOUND ABDOMEN LIMITED RIGHT UPPER QUADRANT COMPARISON:  06/05/2022 FINDINGS: Gallbladder: The gallbladder is not confidently visualized and may be decompressed. Common bile duct: Diameter: 7 mm. There is a shadowing filling defect within the common bile duct which measures 1.2 x 0.9 cm. Liver: Increased parenchymal echogenicity. No focal  lesion identified. Portal vein is patent on color Doppler imaging with normal direction of blood flow towards the liver. Other: None. IMPRESSION: 1. Decompressed gallbladder is not well visualized. 2. Mildly increased caliber of the CBD measures 7 mm. Shadowing filling defect within the common bile duct is indeterminate. If there are clinical signs of choledocholithiasis consider more definitive characterization with MRI/MRCP. 3. Hepatic steatosis. Electronically Signed   By: TKerby MoorsM.D.   On: 08/01/2022 11:07      Assessment/Plan Mr. SCorinna Linesis an 88yo male presenting with epigastric abdominal pain, leukocytosis,  and cholelithiasis. MRI shows stones lodged within the cystic duct and mild surrounding inflammatory changes. The patient has no documented history of prior cholecystectomy and no surgical scars on the abdomen that would correspond to a cholecystectomy. This appears to be a very contracted gallbladder with stones lodged in the neck. Given leukocytosis and pain, recommend medical admission and IV antibiotics for treatment of cholecystitis. Patient is very frail and not an ideal surgical candidate, but if he has stones lodged in the neck may not improve with antibiotics alone. Will reassess and determine need for cholecystectomy.   Michaelle Birks, [facility], Hepatobiliary and Pancreatic Surgery 08/01/22 5:52 PM

## 2022-08-01 NOTE — ED Notes (Signed)
Pt transported to MRI 

## 2022-08-01 NOTE — Hospital Course (Addendum)
88 year old male with COPD, PAF not on anticoagulation due to anemia, CAD status post remote PCI, chronic combined CHF, CKD 3A, HTN, HLD comes into the hospital 08/01/22 with right upper quadrant abdominal pain.  He had similar history with a right upper quadrant ultrasound in October 2023 showing cholelithiasis without cholecystitis, and also on urgent care visit December 2022.  Imaging with MRCP this admission showed small gallbladder with adjacent gallstones in the gallbladder neck and cystic duct.  General surgery was consulted - defer surgical intervention, pt was placed on antibiotics. WBC normalizing. Tolerating full liquid diet as of 12/22 and advanced to low fat diet as tolerated.   Consultants:  General Surgery   Procedures: None       ASSESSMENT & PLAN:   Principal Problem:   Cholecystitis, unspecified Active Problems:   Chronic combined systolic (congestive) and diastolic (congestive) heart failure (HCC)   Chronic kidney disease, stage 3a (HCC)   PAF (paroxysmal atrial fibrillation) (HCC)   Benign essential HTN   CAD (coronary artery disease)   COPD (chronic obstructive pulmonary disease) (HCC)   Hyperlipidemia   BPH (benign prostatic hyperplasia)  Cholecystitis, likely acute on chronic -patient comes in with cholecystitis symptoms, also significant cholelithiasis.  He has been having symptoms as an outpatient before.  White count normalized yesterday morning Appreciate general surgery recs.   He has been placed on antibiotics, continue.     Chronic combined CHF euvolemic.   Last EF improved 45 to 50% from previous of 30%.   No recent anginal symptoms or CHF decompensation. Continue Coreg hold furosemide and losartan this morning due to slight creatinine elevation.     Prior C. Difficile monitor closely, currently no diarrhea.   He is on antibiotics as per #1   CKD 3A baseline creatinine 1.2-1.3, slightly up at 1.4 yesterday morning, hold furosemide and losartan  as above Monitor BMP   PAF looks remote, currently in sinus rhythm.   Not on anticoagulation.   Follows with cardiology as an outpatient   CAD no chest pain continue Coreg, statin   COPD stable, no wheezing continue Symbicort and albuterol   BPH continue Flomax   HLD continue statin       DVT prophylaxis: *** Pertinent IV fluids/nutrition: *** Central lines / invasive devices: ***  Code Status: *** Family Communication: ***  Disposition: *** TOC needs: *** Barriers to discharge / significant pending items: ***

## 2022-08-01 NOTE — Assessment & Plan Note (Signed)
Renal function improved from recent baseline.

## 2022-08-01 NOTE — ED Notes (Signed)
[place] called regarding her husband and would like a update.

## 2022-08-01 NOTE — Assessment & Plan Note (Signed)
Stable, denies chest pain.  Continue Coreg, statin.  Hold aspirin for now in case needs surgical intervention.

## 2022-08-01 NOTE — H&P (Signed)
History and Physical    Leon Estes:[id number] DOB: [birth year] DOA: 08/01/2022  PCP: Venia Carbon, MD  Patient coming from: Home  I have personally briefly reviewed patient's old medical records in [place]  Chief Complaint: RUQ abdominal pain  HPI: Leon Estes is a 88 y.o. male with medical history significant for COPD, PAF not on anticoagulation, CAD s/p remote PCI, chronic combined systolic and diastolic CHF, CKD stage IIIa, HTN, HLD, BPH, hard of hearing who presented to the ED for evaluation of right upper quadrant abdominal pain.  Patient presented to the ED for right upper quadrant and epigastric nonradiating abdominal pain.  Has had recent history of similar pain with outpatient RUQ ultrasound on 06/05/2022 showing cholelithiasis without signs of cholecystitis.  CBD was noted to be in the upper limits of normal.  LFTs were normal.  Patient somewhat somnolent.  He says pain is improved after receiving morphine.  Has no other complaints.  ED Course  Labs/Imaging on admission: I have personally reviewed following labs and imaging studies.  Initial vitals showed BP 201/100, pulse 93, RR 16, temp 98.6 F, SpO2 96% on room air.  Labs show WBC 13.4, hemoglobin 11.3, platelets 253,000, sodium 138, potassium 3.6, bicarb 24, BUN 11, creatinine 1.03, serum glucose 137, LFTs within normal limits.  Troponin negative x 2.  RUQ ultrasound: Decompressed gallbladder not well-visualized.  CBD diameter 7 mm.  Hepatic steatosis.  MRCP shows small gallbladder with adjacent gallstones in gallbladder neck and cystic duct.  CBD measuring 7 mm.  No obstructing calculus or other abnormality identified to the ampulla.  Small volume inflammatory fluid with adjacent to the gallbladder and descending duodenum noted.  General surgery consulted and recommending medical admission with IV antibiotics, no immediate surgical plans at this time.  The hospitalist service was consulted to  admit for further evaluation and management.  Review of Systems: All systems reviewed and are negative except as documented in history of present illness above.   Past Medical History:  Diagnosis Date   Asthma    BPH (benign prostatic hypertrophy)    Cataract    Chronic renal disease, stage III (HCC)    Chronic sinusitis    COPD (chronic obstructive pulmonary disease) (HCC)    Coronary artery disease    2 stents   Gall stones    Hearing loss    DOES NOT WEAR HEARING AIDS   High cholesterol    Hypertension    Lactic acid acidosis 10/12/2021   Macular degeneration    right eye   Rash 12/04/2021   Septic shock ([place]) 10/10/2021    Past Surgical History:  Procedure Laterality Date   APPENDECTOMY     CORONARY ANGIOPLASTY WITH STENT PLACEMENT  1998   2 stents   TRANSURETHRAL RESECTION OF PROSTATE      Social History:  reports that he quit smoking about 16 years ago. His smoking use included cigarettes. He has been exposed to tobacco smoke. His smokeless tobacco use includes chew. He reports that he does not drink alcohol and does not use drugs.  Allergies  Allergen Reactions   Augmentin [Amoxicillin-Pot Clavulanate] Rash   Bacitracin-Polymyxin B Swelling and Rash   Benzalkonium Chloride     "Benzalkonium chloride is a quaternary ammonium antiseptic and disinfectant with actions and uses similar to those of other cationic surfactants. It is also used as an antimicrobial preservative for pharmaceutical products."   Neosporin [Neomycin-Bacitracin Zn-Polymyx] Rash   Sulfamethoxazole-Trimethoprim Rash   Terbinafine  And Related Rash    Family History  Problem Relation Age of Onset   Diabetes Father    Heart disease Brother      Prior to Admission medications   Medication Sig Start Date End Date Taking? Authorizing Provider  acetaminophen (TYLENOL) 500 MG tablet Take 1,000 mg by mouth every 6 (six) hours as needed for moderate pain or headache.    [provider]   aspirin 81 MG EC tablet Take 1 tablet (81 mg total) by mouth daily. Swallow whole. 10/18/21   Darliss Cheney, MD  budesonide-formoterol (SYMBICORT) 160-4.5 MCG/ACT inhaler Inhale 2 puffs into the lungs 2 (two) times daily. 10/23/21   Venia Carbon, MD  carvedilol (COREG) 3.125 MG tablet Take 1 tablet (3.125 mg total) by mouth 2 (two) times daily with a meal. 11/07/21   Imogene Burn, PA-C  furosemide (LASIX) 20 MG tablet Take 1 tablet (20 mg total) by mouth daily. 11/07/21   Imogene Burn, PA-C  gabapentin (NEURONTIN) 400 MG capsule TAKE 1 CAPSULE BY MOUTH EVERY NIGHT AT BEDTIME 11/28/21   Venia Carbon, MD  losartan (COZAAR) 25 MG tablet Take 0.5 tablets (12.5 mg total) by mouth daily. 11/07/21   Imogene Burn, PA-C  Multiple Vitamin (MULTIVITAMIN WITH MINERALS) TABS tablet Take 1 tablet by mouth daily. 01/23/21   Georgette Shell, MD  omeprazole (PRILOSEC) 20 MG capsule Take 1 capsule (20 mg total) by mouth daily. 10/20/21   Kennyth Arnold, FNP  potassium chloride (KLOR-CON) 10 MEQ tablet Take 1 tablet (10 mEq total) by mouth daily. 11/07/21   Imogene Burn, PA-C  QUEtiapine (SEROQUEL) 25 MG tablet Take 1 tablet (25 mg total) by mouth at bedtime. 08/18/21   Venia Carbon, MD  simvastatin (ZOCOR) 40 MG tablet Take 1 tablet (40 mg total) by mouth at bedtime. 10/23/21   Venia Carbon, MD  tamsulosin (FLOMAX) 0.4 MG CAPS capsule TAKE 1 CAPSULE BY MOUTH ONCE DAILY Patient taking differently: 0.4 mg daily after supper. 08/07/21   Venia Carbon, MD  traMADol (ULTRAM) 50 MG tablet TAKE 1 TABLET BY MOUTH BY MOUTH DAILY ASNEEDED FOR PAIN 06/19/22   Viviana Simpler I, MD  triamcinolone cream (KENALOG) 0.1 % Apply 1 Application topically 2 (two) times daily as needed. 04/02/22   Owens Loffler, MD    Physical Exam: Vitals:   08/01/22 1718 08/01/22 1930 08/01/22 2033 08/01/22 2128  BP: (!) 201/100 (!) 159/96 (!) 187/89 (!) 176/74  Pulse: 93 93 83 87  Resp: '16 16 18 18  '$ Temp: 98.6  F (37 C)  98 F (36.7 C) 98.1 F (36.7 C)  TempSrc: Oral  Oral Oral  SpO2: 96% 95% 96% 99%  Weight:      Height:       Constitutional: Elderly man resting in bed.  NAD, calm, comfortable Eyes: EOMI, lids and conjunctivae normal ENMT: Hard of hearing.  Mucous membranes are moist. Posterior pharynx clear of any exudate or lesions.Normal dentition.  Neck: normal, supple, no masses. Respiratory: clear to auscultation bilaterally, no wheezing, no crackles. Normal respiratory effort. No accessory muscle use.  Cardiovascular: Irregularly irregular, no murmurs / rubs / gallops. No extremity edema. 2+ pedal pulses. Abdomen: no tenderness, no masses palpated.  Musculoskeletal: no clubbing / cyanosis. No joint deformity upper and lower extremities. Good ROM, no contractures. Normal muscle tone.  Skin: no rashes, lesions, ulcers. No induration Neurologic: Sensation intact. Strength 5/5 in all 4.  Psychiatric: Alert and oriented x  3.  EKG: Personally reviewed. A-fib, rate 75, RBBB, PVCs.  Similar to prior.  Assessment/Plan Principal Problem:   Cholecystitis, unspecified Active Problems:   Chronic combined systolic (congestive) and diastolic (congestive) heart failure (HCC)   Chronic kidney disease, stage 3a (HCC)   PAF (paroxysmal atrial fibrillation) (HCC)   Benign essential HTN   CAD (coronary artery disease)   COPD (chronic obstructive pulmonary disease) (HCC)   Hyperlipidemia   BPH (benign prostatic hyperplasia)   Leon Estes is a 88 y.o. male with medical history significant for COPD, PAF not on anticoagulation, CAD s/p remote PCI, chronic combined systolic and diastolic CHF, CKD stage IIIa, HTN, HLD, BPH, hard of hearing who is admitted with cholecystitis.  Assessment and Plan: * Cholecystitis, unspecified Presenting with RUQ/epigastric abdominal pain, leukocytosis, cholelithiasis.  General surgery following, findings felt due to contracted gallbladder with stones lodged in the  neck.  Conservative management with IV antibiotics recommended for now. -General surgery to follow -Start IV ceftriaxone and Flagyl -Will keep n.p.o. tonight  Chronic combined systolic (congestive) and diastolic (congestive) heart failure ([place]) Euvolemic on admission.  Last EF improved to 45-50% from previous of 30%. -Resume home Lasix 20 mg daily tomorrow -Continue Coreg 3.125 mg twice daily -Continue losartan 12.5 mg daily  PAF (paroxysmal atrial fibrillation) (HCC) Rate controlled.  Not on anticoagulation due to fall risk.  Holding aspirin as above.  Continue Coreg.  Chronic kidney disease, stage 3a ([place]) Renal function improved from recent baseline.  Benign essential HTN Continue Coreg, losartan, Lasix.  CAD (coronary artery disease) Stable, denies chest pain.  Continue Coreg, statin.  Hold aspirin for now in case needs surgical intervention.  COPD (chronic obstructive pulmonary disease) (HCC) Stable.  Continue Symbicort and albuterol as needed.  BPH (benign prostatic hyperplasia) Continue Flomax.  Hyperlipidemia Continue simvastatin.  DVT prophylaxis: heparin injection 5,000 Units Start: 08/01/22 2200 Code Status: DNR Family Communication: Discussed with patient Disposition Plan: From home, dispo pending clinical progress Consults called: General surgery Severity of Illness: The appropriate patient status for this patient is INPATIENT. Inpatient status is judged to be reasonable and necessary in order to provide the required intensity of service to ensure the patient's safety. The patient's presenting symptoms, physical exam findings, and initial radiographic and laboratory data in the context of their chronic comorbidities is felt to place them at high risk for further clinical deterioration. Furthermore, it is not anticipated that the patient will be medically stable for discharge from the hospital within 2 midnights of admission.   * I certify that at the point of  admission it is my clinical judgment that the patient will require inpatient hospital care spanning beyond 2 midnights from the point of admission due to high intensity of service, high risk for further deterioration and high frequency of surveillance required.Zada Finders MD [facility]  If 7PM-7AM, please contact night-coverage [email or web address]  08/01/2022, 11:47 PM

## 2022-08-01 NOTE — Assessment & Plan Note (Addendum)
Euvolemic on admission.  Last EF improved to 45-50% from previous of 30%. -Resume home Lasix 20 mg daily tomorrow -Continue Coreg 3.125 mg twice daily -Continue losartan 12.5 mg daily

## 2022-08-01 NOTE — Assessment & Plan Note (Signed)
Rate controlled.  Not on anticoagulation due to fall risk.  Holding aspirin as above.  Continue Coreg.

## 2022-08-01 NOTE — Assessment & Plan Note (Signed)
Presenting with RUQ/epigastric abdominal pain, leukocytosis, cholelithiasis.  General surgery following, findings felt due to contracted gallbladder with stones lodged in the neck.  Conservative management with IV antibiotics recommended for now. -General surgery to follow -Start IV ceftriaxone and Flagyl -Will keep n.p.o. tonight

## 2022-08-01 NOTE — Assessment & Plan Note (Signed)
Stable.  Continue Symbicort and albuterol as needed.

## 2022-08-01 NOTE — ED Notes (Signed)
Patient assisted back on to bed. Denies any needs at this time.

## 2022-08-01 NOTE — Assessment & Plan Note (Signed)
-   Continue Flomax 

## 2022-08-01 NOTE — ED Provider Notes (Signed)
  Physical Exam  BP (!) 182/96 (BP Location: Right Arm)   Pulse 88   Temp 98 F (36.7 C) (Oral)   Resp 18   Ht '5\' 6"'$  (1.676 m)   Wt 65.8 kg   SpO2 96%   BMI 23.40 kg/m   Physical Exam  Procedures  Procedures  ED Course / MDM   Clinical Course as of 08/01/22 1708  Wed Aug 01, 2022  1707 Assumed care from Dr Tomi Bamberger. 88 yo M who presented with RUQ pain. Equivocal US.and MRCP with possible stump cholecystitis. WBC of 13. RUQ TTP on exam. General surgery consulted.  [RP]    Clinical Course User Index [RP] Fransico Meadow, MD   Medical Decision Making Amount and/or Complexity of Data Reviewed Labs: ordered. Radiology: ordered.  Risk Prescription drug management.   ***

## 2022-08-01 NOTE — ED Provider Notes (Signed)
Patient initially seen by Dr. Dina Rich.  Please see her note.  Patient was here when doing an MRCP as he had symptoms concerning for choledocholithiasis.  The MRCP does not show any evidence of choledocholithiasis.  Patient has unchanged biliary ductal dilatation.  However there is evidence of gallstones in the gallbladder remnant as well as some small volume inflammatory fluid adjacent to the gallbladder and descending duodenum.  Will place consult to discuss with general surgery.   Dorie Rank, MD 08/01/22 1710

## 2022-08-01 NOTE — ED Triage Notes (Addendum)
Pt BIBGEMS from home for epigastric pain started at 0430 this morning. Took pepto to no relief. Pt took BP meds on scene with EMS  Hx gerd - no relief  BP 152/98 HR 70 RR 18 99% RA CBG 128 Afebrile

## 2022-08-01 NOTE — Assessment & Plan Note (Signed)
Continue Coreg, losartan, Lasix.

## 2022-08-02 DIAGNOSIS — K819 Cholecystitis, unspecified: Secondary | ICD-10-CM | POA: Diagnosis not present

## 2022-08-02 LAB — CBC
HCT: 41.2 % (ref 39.0–52.0)
Hemoglobin: 13 g/dL (ref 13.0–17.0)
MCH: 26.4 pg (ref 26.0–34.0)
MCHC: 31.6 g/dL (ref 30.0–36.0)
MCV: 83.7 fL (ref 80.0–100.0)
Platelets: 282 10*3/uL (ref 150–400)
RBC: 4.92 MIL/uL (ref 4.22–5.81)
RDW: 17.2 % — ABNORMAL HIGH (ref 11.5–15.5)
WBC: 16.2 10*3/uL — ABNORMAL HIGH (ref 4.0–10.5)
nRBC: 0 % (ref 0.0–0.2)

## 2022-08-02 LAB — COMPREHENSIVE METABOLIC PANEL
ALT: 17 U/L (ref 0–44)
AST: 29 U/L (ref 15–41)
Albumin: 3.9 g/dL (ref 3.5–5.0)
Alkaline Phosphatase: 86 U/L (ref 38–126)
Anion gap: 11 (ref 5–15)
BUN: 10 mg/dL (ref 8–23)
CO2: 27 mmol/L (ref 22–32)
Calcium: 9.6 mg/dL (ref 8.9–10.3)
Chloride: 97 mmol/L — ABNORMAL LOW (ref 98–111)
Creatinine, Ser: 1.12 mg/dL (ref 0.61–1.24)
GFR, Estimated: >60 mL/min (ref >60)
Glucose, Bld: 129 mg/dL — ABNORMAL HIGH (ref 70–99)
Potassium: 4.5 mmol/L (ref 3.5–5.1)
Sodium: 135 mmol/L (ref 135–145)
Total Bilirubin: 0.6 mg/dL (ref 0.3–1.2)
Total Protein: 7.7 g/dL (ref 6.5–8.1)

## 2022-08-02 MED ORDER — QUETIAPINE FUMARATE 50 MG PO TABS
25.0000 mg | ORAL_TABLET | Freq: Every day | ORAL | Status: DC
  Administered 2022-08-02 – 2022-08-03 (×2): 25 mg via ORAL
  Filled 2022-08-02 (×2): qty 1

## 2022-08-02 NOTE — Progress Notes (Signed)
PROGRESS NOTE  Leon Estes EHU:[id number] DOB: [birth year] DOA: 08/01/2022 PCP: Viviana Simpler I, MD   LOS: 1 day   Brief Narrative / Interim history: 88 year old male with COPD, PAF not on anticoagulation due to anemia, CAD status post remote PCI, chronic combined CHF, CKD 3A, HTN, HLD comes into the hospital with right upper quadrant abdominal pain.  He had similar history with a right upper quadrant ultrasound in October 2023 showing cholelithiasis without cholecystitis, and also on urgent care visit December 2022.  Imaging with MRCP this admission showed small gallbladder with adjacent gallstones in the gallbladder neck and cystic duct.  General surgery was consulted  Subjective / 24h Interval events: Hard of hearing.  States that his abdominal pain is overall better.  No nausea or vomiting this morning  Assesement and Plan: Principal Problem:   Cholecystitis, unspecified Active Problems:   Chronic combined systolic (congestive) and diastolic (congestive) heart failure (HCC)   Chronic kidney disease, stage 3a (HCC)   PAF (paroxysmal atrial fibrillation) (HCC)   Benign essential HTN   CAD (coronary artery disease)   COPD (chronic obstructive pulmonary disease) (HCC)   Hyperlipidemia   BPH (benign prostatic hyperplasia)   Principal problem Cholecystitis, likely acute on chronic -patient comes in with cholecystitis symptoms, also significant cholelithiasis.  He has been having symptoms as an outpatient before.  Appreciate general surgery follow-up.  He has been placed on antibiotics, continue  Active problems Chronic combined CHF-euvolemic this morning.  Last EF improved 45 to 50% from previous of 30%.  Continue Coreg, furosemide, losartan.  No recent anginal symptoms or CHF decompensation.  Prior C. difficile-monitor closely, currently no diarrhea.  He is on antibiotics as per #1  CKD 3A-baseline creatinine 1.2-1.3, currently at baseline  PAF-looks remote, currently in  sinus rhythm.  Not on anticoagulation.  Follows with cardiology as an outpatient  CAD-no chest pain, continue Coreg, statin  COPD-stable, no wheezing, continue Symbicort and albuterol  BPH-continue Flomax  HLD-continue statin   Scheduled Meds:  carvedilol  3.125 mg Oral BID WC   furosemide  20 mg Oral Daily   heparin  5,000 Units Subcutaneous Q8H   losartan  12.5 mg Oral Daily   mometasone-formoterol  2 puff Inhalation BID   simvastatin  40 mg Oral QHS   tamsulosin  0.4 mg Oral QPC supper   Continuous Infusions:  cefTRIAXone (ROCEPHIN)  IV Stopped (08/01/22 2109)   metronidazole 500 mg (08/02/22 0854)   PRN Meds:.acetaminophen **OR** acetaminophen, bisacodyl, morphine injection, prochlorperazine, senna-docusate  Current Outpatient Medications  Medication Instructions   acetaminophen (TYLENOL) 1,000 mg, Oral, Every 6 hours PRN   Aspirin Low Dose 81 mg, Oral, Daily, Swallow whole.   budesonide-formoterol (SYMBICORT) 160-4.5 MCG/ACT inhaler 2 puffs, Inhalation, 2 times daily   carvedilol (COREG) 3.125 mg, Oral, 2 times daily with meals   furosemide (LASIX) 20 mg, Oral, Daily   gabapentin (NEURONTIN) 400 MG capsule TAKE 1 CAPSULE BY MOUTH EVERY NIGHT AT BEDTIME   losartan (COZAAR) 12.5 mg, Oral, Daily   Multiple Vitamin (MULTIVITAMIN WITH MINERALS) TABS tablet 1 tablet, Oral, Daily   omeprazole (PRILOSEC) 20 mg, Oral, Daily   potassium chloride (KLOR-CON) 10 MEQ tablet 10 mEq, Oral, Daily   QUEtiapine (SEROQUEL) 25 mg, Oral, Daily at bedtime   simvastatin (ZOCOR) 40 mg, Oral, Daily at bedtime   tamsulosin (FLOMAX) 0.4 MG CAPS capsule TAKE 1 CAPSULE BY MOUTH ONCE DAILY   traMADol (ULTRAM) 50 MG tablet TAKE 1 TABLET BY MOUTH BY MOUTH DAILY  ASNEEDED FOR PAIN   triamcinolone cream (KENALOG) 0.1 % 1 Application, Topical, 2 times daily PRN    Diet Orders (From admission, onward)     Start     Ordered   08/02/22 0932  Diet full liquid Room service appropriate? Yes; Fluid  consistency: Thin  Diet effective now       Question Answer Comment  Room service appropriate? Yes   Fluid consistency: Thin      08/02/22 0931            DVT prophylaxis: heparin injection 5,000 Units Start: 08/01/22 2200   Lab Results  Component Value Date   PLT 282 08/02/2022      Code Status: DNR  Family Communication: No family at bedside  Status is: Inpatient  Remains inpatient appropriate because: Severity of illness   Level of care: Med-Surg  Consultants:  General surgery  Objective: Vitals:   08/01/22 2128 08/01/22 2351 08/02/22 0459 08/02/22 0500  BP: (!) 176/74 (!) 173/72 133/61   Pulse: 87 88 88   Resp: '18 17 18   '$ Temp: 98.1 F (36.7 C) 98.1 F (36.7 C) 97.9 F (36.6 C)   TempSrc: Oral Oral Oral   SpO2: 99% 98% 98%   Weight:    62.3 kg  Height:        Intake/Output Summary (Last 24 hours) at 08/02/2022 8115 Last data filed at 08/01/2022 2110 Gross per 24 hour  Intake 200 ml  Output --  Net 200 ml   Wt Readings from Last 3 Encounters:  08/02/22 62.3 kg  07/02/22 66.2 kg  05/23/22 66.7 kg    Examination:  Constitutional: NAD Eyes: no scleral icterus ENMT: Mucous membranes are moist.  Neck: normal, supple Respiratory: clear to auscultation bilaterally, no wheezing, no crackles. Normal respiratory effort. No accessory muscle use.  Cardiovascular: Regular rate and rhythm, no murmurs / rubs / gallops. No LE edema.  Abdomen: non distended, no tenderness. Bowel sounds positive.  Musculoskeletal: no clubbing / cyanosis.  Skin: no rashes Neurologic: non focal   Data Reviewed: I have independently reviewed following labs and imaging studies   CBC Recent Labs  Lab 08/01/22 0849 08/02/22 0316  WBC 13.4* 16.2*  HGB 11.3* 13.0  HCT 38.6* 41.2  PLT 253 282  MCV 87.5 83.7  MCH 25.6* 26.4  MCHC 29.3* 31.6  RDW 17.2* 17.2*    Recent Labs  Lab 08/01/22 0849 08/02/22 0316  NA 138 135  K 3.6 4.5  CL 103 97*  CO2 24 27   GLUCOSE 137* 129*  BUN 11 10  CREATININE 1.03 1.12  CALCIUM 9.5 9.6  AST 25 29  ALT 16 17  ALKPHOS 69 86  BILITOT 0.7 0.6  ALBUMIN 3.9 3.9    ------------------------------------------------------------------------------------------------------------------ No results for input(s): "CHOL", "HDL", "LDLCALC", "TRIG", "CHOLHDL", "LDLDIRECT" in the last 72 hours.  Lab Results  Component Value Date   HGBA1C 6.5 (H) 01/06/2021   ------------------------------------------------------------------------------------------------------------------ No results for input(s): "TSH", "T4TOTAL", "T3FREE", "THYROIDAB" in the last 72 hours.  Invalid input(s): "FREET3"  Cardiac Enzymes No results for input(s): "CKMB", "TROPONINI", "MYOGLOBIN" in the last 168 hours.  Invalid input(s): "CK" ------------------------------------------------------------------------------------------------------------------    Component Value Date/Time   BNP 689.3 (H) 12/27/2020 1754    CBG: No results for input(s): "GLUCAP" in the last 168 hours.  No results found for this or any previous visit (from the past 240 hour(s)).   Radiology Studies: MR ABDOMEN MRCP W WO CONTAST  Result Date: 08/01/2022 CLINICAL DATA:  Cholelithiasis EXAM: MRI ABDOMEN WITHOUT AND WITH CONTRAST (INCLUDING MRCP) TECHNIQUE: Multiplanar multisequence MR imaging of the abdomen was performed both before and after the administration of intravenous contrast. Heavily T2-weighted images of the biliary and pancreatic ducts were obtained, and three-dimensional MRCP images were rendered by post processing. CONTRAST:  6.19m GADAVIST GADOBUTROL 1 MMOL/ML IV SOLN COMPARISON:  Right upper quadrant ultrasound, 08/01/2022, CT abdomen pelvis, 10/10/2021 FINDINGS: Lower chest: No acute abnormality. Hepatobiliary: No solid liver abnormality is seen. Status post cholecystectomy. Small gallbladder remnant with adjacent gallstones in the gallbladder neck and cystic  duct measuring 0.7 and 0.8 cm (series 3, image 26). Intra and extrahepatic biliary ductal dilatation is unchanged comparison to prior CT, the common bile duct measuring up to 0.7 cm in caliber. No obstructing calculus or other abnormality identified to the ampulla. Pancreas: Unremarkable. No pancreatic ductal dilatation or surrounding inflammatory changes. Spleen: Normal in size without significant abnormality. Adrenals/Urinary Tract: Adrenal glands are unremarkable. Simple, benign bilateral renal cortical cysts, for which no further follow-up or characterization is required. Kidneys are otherwise normal, without renal calculi, solid lesion, or hydronephrosis. Stomach/Bowel: Stomach is within normal limits. No evidence of bowel wall thickening, distention, or inflammatory changes. Vascular/Lymphatic: No significant vascular findings are present. No enlarged abdominal lymph nodes. Other: No abdominal wall hernia or abnormality. Small volume inflammatory fluid adjacent to the gallbladder remnant and descending duodenum (series 4, image 19). Musculoskeletal: No acute or significant osseous findings. IMPRESSION: 1. Status post cholecystectomy. Small gallbladder remnant with adjacent gallstones in the gallbladder neck and cystic duct measuring 0.7 and 0.8 cm. 2. Intra and extrahepatic biliary ductal dilatation is unchanged comparison to prior CT, the common bile duct measuring up to 0.7 cm in caliber. No obstructing calculus or other abnormality identified to the ampulla. 3. Small volume inflammatory fluid adjacent to the gallbladder remnant and descending duodenum, of uncertain etiology. Electronically Signed   By: ADelanna AhmadiM.D.   On: 08/01/2022 16:16   UKoreaAbdomen Limited RUQ (LIVER/GB)  Result Date: 08/01/2022 CLINICAL DATA:  Epigastric pain. EXAM: ULTRASOUND ABDOMEN LIMITED RIGHT UPPER QUADRANT COMPARISON:  06/05/2022 FINDINGS: Gallbladder: The gallbladder is not confidently visualized and may be  decompressed. Common bile duct: Diameter: 7 mm. There is a shadowing filling defect within the common bile duct which measures 1.2 x 0.9 cm. Liver: Increased parenchymal echogenicity. No focal lesion identified. Portal vein is patent on color Doppler imaging with normal direction of blood flow towards the liver. Other: None. IMPRESSION: 1. Decompressed gallbladder is not well visualized. 2. Mildly increased caliber of the CBD measures 7 mm. Shadowing filling defect within the common bile duct is indeterminate. If there are clinical signs of choledocholithiasis consider more definitive characterization with MRI/MRCP. 3. Hepatic steatosis. Electronically Signed   By: TKerby MoorsM.D.   On: 08/01/2022 11:07     CMarzetta Board MD, PhD [facility]  Between 7 am - 7 pm I am available, please contact me via Amion (for emergencies) or Securechat (non urgent messages)  Between 7 pm - 7 am I am not available, please contact night coverage MD/APP via Amion

## 2022-08-02 NOTE — Progress Notes (Signed)
Progress Note     Subjective: No family at bedside this AM. Pt reports abdominal pain is a little better.   Objective: Vital signs in last 24 hours: Temp:  [97.9 F (36.6 C)-98.6 F (37 C)] 97.9 F (36.6 C) (12/21 0459) Pulse Rate:  [77-93] 88 (12/21 0459) Resp:  [16-18] 18 (12/21 0459) BP: (133-207)/(61-100) 133/61 (12/21 0459) SpO2:  [95 %-100 %] 98 % (12/21 0459) Weight:  [62.3 kg] 62.3 kg (12/21 0500) Last BM Date : 07/30/22  Intake/Output from previous day: 12/20 0701 - 12/21 0700 In: 200 [IV Piggyback:200] Out: -  Intake/Output this shift: No intake/output data recorded.  PE: General: pleasant, WD, elderly male who is laying in bed in NAD HEENT: sclera anicteric Heart: regular, rate, and rhythm Lungs: Respiratory effort nonlabored Abd: soft, mild ttp on deep palpation of RUQ, ND, +BS    Lab Results:  Recent Labs    08/01/22 0849 08/02/22 0316  WBC 13.4* 16.2*  HGB 11.3* 13.0  HCT 38.6* 41.2  PLT 253 282   BMET Recent Labs    08/01/22 0849 08/02/22 0316  NA 138 135  K 3.6 4.5  CL 103 97*  CO2 24 27  GLUCOSE 137* 129*  BUN 11 10  CREATININE 1.03 1.12  CALCIUM 9.5 9.6   PT/INR No results for input(s): "LABPROT", "INR" in the last 72 hours. CMP     Component Value Date/Time   NA 135 08/02/2022 0316   NA 135 (L) 04/29/2013 1543   K 4.5 08/02/2022 0316   K 3.3 (L) 04/29/2013 1543   CL 97 (L) 08/02/2022 0316   CL 102 04/29/2013 1543   CO2 27 08/02/2022 0316   CO2 27 04/29/2013 1543   GLUCOSE 129 (H) 08/02/2022 0316   GLUCOSE 120 (H) 04/29/2013 1543   BUN 10 08/02/2022 0316   BUN 17 04/29/2013 1543   CREATININE 1.12 08/02/2022 0316   CREATININE 2.47 (H) 09/09/2020 1343   CALCIUM 9.6 08/02/2022 0316   CALCIUM 9.8 04/29/2013 1543   PROT 7.7 08/02/2022 0316   PROT 7.9 04/29/2013 1543   ALBUMIN 3.9 08/02/2022 0316   ALBUMIN 3.8 04/29/2013 1543   AST 29 08/02/2022 0316   AST 30 04/29/2013 1543   ALT 17 08/02/2022 0316   ALT 24  04/29/2013 1543   ALKPHOS 86 08/02/2022 0316   ALKPHOS 79 04/29/2013 1543   BILITOT 0.6 08/02/2022 0316   BILITOT 0.4 04/29/2013 1543   GFRNONAA >60 08/02/2022 0316   GFRNONAA >60 04/29/2013 1543   GFRAA 42 (L) 05/04/2017 1945   GFRAA >60 04/29/2013 1543   Lipase     Component Value Date/Time   LIPASE 34 08/01/2022 0849   LIPASE 154 04/29/2013 1543       Studies/Results: MR ABDOMEN MRCP W WO CONTAST  Result Date: 08/01/2022 CLINICAL DATA:  Cholelithiasis EXAM: MRI ABDOMEN WITHOUT AND WITH CONTRAST (INCLUDING MRCP) TECHNIQUE: Multiplanar multisequence MR imaging of the abdomen was performed both before and after the administration of intravenous contrast. Heavily T2-weighted images of the biliary and pancreatic ducts were obtained, and three-dimensional MRCP images were rendered by post processing. CONTRAST:  6.96m GADAVIST GADOBUTROL 1 MMOL/ML IV SOLN COMPARISON:  Right upper quadrant ultrasound, 08/01/2022, CT abdomen pelvis, 10/10/2021 FINDINGS: Lower chest: No acute abnormality. Hepatobiliary: No solid liver abnormality is seen. Status post cholecystectomy. Small gallbladder remnant with adjacent gallstones in the gallbladder neck and cystic duct measuring 0.7 and 0.8 cm (series 3, image 26). Intra and extrahepatic biliary ductal dilatation is  unchanged comparison to prior CT, the common bile duct measuring up to 0.7 cm in caliber. No obstructing calculus or other abnormality identified to the ampulla. Pancreas: Unremarkable. No pancreatic ductal dilatation or surrounding inflammatory changes. Spleen: Normal in size without significant abnormality. Adrenals/Urinary Tract: Adrenal glands are unremarkable. Simple, benign bilateral renal cortical cysts, for which no further follow-up or characterization is required. Kidneys are otherwise normal, without renal calculi, solid lesion, or hydronephrosis. Stomach/Bowel: Stomach is within normal limits. No evidence of bowel wall thickening,  distention, or inflammatory changes. Vascular/Lymphatic: No significant vascular findings are present. No enlarged abdominal lymph nodes. Other: No abdominal wall hernia or abnormality. Small volume inflammatory fluid adjacent to the gallbladder remnant and descending duodenum (series 4, image 19). Musculoskeletal: No acute or significant osseous findings. IMPRESSION: 1. Status post cholecystectomy. Small gallbladder remnant with adjacent gallstones in the gallbladder neck and cystic duct measuring 0.7 and 0.8 cm. 2. Intra and extrahepatic biliary ductal dilatation is unchanged comparison to prior CT, the common bile duct measuring up to 0.7 cm in caliber. No obstructing calculus or other abnormality identified to the ampulla. 3. Small volume inflammatory fluid adjacent to the gallbladder remnant and descending duodenum, of uncertain etiology. Electronically Signed   By: Delanna Ahmadi M.D.   On: 08/01/2022 16:16   US Abdomen Limited RUQ (LIVER/GB)  Result Date: 08/01/2022 CLINICAL DATA:  Epigastric pain. EXAM: ULTRASOUND ABDOMEN LIMITED RIGHT UPPER QUADRANT COMPARISON:  06/05/2022 FINDINGS: Gallbladder: The gallbladder is not confidently visualized and may be decompressed. Common bile duct: Diameter: 7 mm. There is a shadowing filling defect within the common bile duct which measures 1.2 x 0.9 cm. Liver: Increased parenchymal echogenicity. No focal lesion identified. Portal vein is patent on color Doppler imaging with normal direction of blood flow towards the liver. Other: None. IMPRESSION: 1. Decompressed gallbladder is not well visualized. 2. Mildly increased caliber of the CBD measures 7 mm. Shadowing filling defect within the common bile duct is indeterminate. If there are clinical signs of choledocholithiasis consider more definitive characterization with MRI/MRCP. 3. Hepatic steatosis. Electronically Signed   By: Kerby Moors M.D.   On: 08/01/2022 11:07    Anti-infectives: Anti-infectives (From  admission, onward)    Start     Dose/Rate Route Frequency Ordered Stop   08/01/22 2015  cefTRIAXone (ROCEPHIN) 2 g in sodium chloride 0.9 % 100 mL IVPB        2 g 200 mL/hr over 30 Minutes Intravenous Every 24 hours 08/01/22 2006     08/01/22 2015  metroNIDAZOLE (FLAGYL) IVPB 500 mg        500 mg 100 mL/hr over 60 Minutes Intravenous Every 12 hours 08/01/22 2006          Assessment/Plan  Cholelithiasis and chronic cholecystitis  - pt seen previously in 2022 but had C. Diff colitis at that time and it was recommended he get over C. Diff prior to consideration for laparoscopic cholecystectomy. It appears he may have been lost to follow up - will confirm with patient's daughter later today  - WBC  16 from 13, Afebrile and HD stable - LFTs not elevated - gallbladder is very contracted with stones impacted in the neck on imaging and cholecystectomy may be very difficult, recommend IV abx and PO challenge for now. If patient does not improve may need to consider cholecystectomy. I do not currently think IR drainage would be an option given that gallbladder is very contracted  FEN: FLD, IVF per [facility] VTE: [facility] ID: [email or web address]/flagyl  12/20>>  - Below per [facility] -  CAD status post stenting to RCA in 6962 Combined systolic and diastolic CHF COPD CKD stage IIIa Hypertension Hyperlipidemia BPH PAF not on anticoagulation  LOS: 1 day     Norm Parcel, [facility] [address] [facility] 08/02/2022, 9:31 AM Please see Amion for pager number during day hours 7:00am-4:30pm

## 2022-08-03 DIAGNOSIS — K819 Cholecystitis, unspecified: Secondary | ICD-10-CM | POA: Diagnosis not present

## 2022-08-03 LAB — CBC
HCT: 32.1 % — ABNORMAL LOW (ref 39.0–52.0)
Hemoglobin: 9.9 g/dL — ABNORMAL LOW (ref 13.0–17.0)
MCH: 25.9 pg — ABNORMAL LOW (ref 26.0–34.0)
MCHC: 30.8 g/dL (ref 30.0–36.0)
MCV: 84 fL (ref 80.0–100.0)
Platelets: 192 10*3/uL (ref 150–400)
RBC: 3.82 MIL/uL — ABNORMAL LOW (ref 4.22–5.81)
RDW: 17.2 % — ABNORMAL HIGH (ref 11.5–15.5)
WBC: 10.1 10*3/uL (ref 4.0–10.5)
nRBC: 0 % (ref 0.0–0.2)

## 2022-08-03 LAB — COMPREHENSIVE METABOLIC PANEL
ALT: 10 U/L (ref 0–44)
AST: 24 U/L (ref 15–41)
Albumin: 3 g/dL — ABNORMAL LOW (ref 3.5–5.0)
Alkaline Phosphatase: 59 U/L (ref 38–126)
Anion gap: 11 (ref 5–15)
BUN: 20 mg/dL (ref 8–23)
CO2: 24 mmol/L (ref 22–32)
Calcium: 8.9 mg/dL (ref 8.9–10.3)
Chloride: 98 mmol/L (ref 98–111)
Creatinine, Ser: 1.45 mg/dL — ABNORMAL HIGH (ref 0.61–1.24)
GFR, Estimated: 46 mL/min — ABNORMAL LOW (ref >60)
Glucose, Bld: 92 mg/dL (ref 70–99)
Potassium: 3.7 mmol/L (ref 3.5–5.1)
Sodium: 133 mmol/L — ABNORMAL LOW (ref 135–145)
Total Bilirubin: 0.4 mg/dL (ref 0.3–1.2)
Total Protein: 5.8 g/dL — ABNORMAL LOW (ref 6.5–8.1)

## 2022-08-03 LAB — MAGNESIUM: Magnesium: 1.7 mg/dL (ref 1.7–2.4)

## 2022-08-03 NOTE — Progress Notes (Signed)
Progress Note     Subjective: No family at bedside this AM. Pt reports he tolerated FLD without n/v but did not eat much. Not having much pain this AM.   Objective: Vital signs in last 24 hours: Temp:  [97.6 F (36.4 C)-98.8 F (37.1 C)] 97.6 F (36.4 C) (12/22 0727) Pulse Rate:  [78-99] 79 (12/22 0727) Resp:  [16-18] 16 (12/22 0727) BP: (101-118)/(50-95) 110/95 (12/22 0727) SpO2:  [94 %-97 %] 97 % (12/22 0926) Weight:  [65.1 kg-65.3 kg] 65.1 kg (12/22 0500) Last BM Date : 07/31/22  Intake/Output from previous day: 12/21 0701 - 12/22 0700 In: 660 [P.O.:360; IV Piggyback:300] Out: 100 [Urine:100] Intake/Output this shift: No intake/output data recorded.  PE: General: pleasant, WD, elderly male who is laying in bed in NAD HEENT: sclera anicteric Heart: regular, rate, and rhythm Lungs: Respiratory effort nonlabored Abd: soft, mild ttp on deep palpation of RUQ, ND, +BS    Lab Results:  Recent Labs    08/02/22 0316 08/03/22 0229  WBC 16.2* 10.1  HGB 13.0 9.9*  HCT 41.2 32.1*  PLT 282 192    BMET Recent Labs    08/02/22 0316 08/03/22 0229  NA 135 133*  K 4.5 3.7  CL 97* 98  CO2 27 24  GLUCOSE 129* 92  BUN 10 20  CREATININE 1.12 1.45*  CALCIUM 9.6 8.9    PT/INR No results for input(s): "LABPROT", "INR" in the last 72 hours. CMP     Component Value Date/Time   NA 133 (L) 08/03/2022 0229   NA 135 (L) 04/29/2013 1543   K 3.7 08/03/2022 0229   K 3.3 (L) 04/29/2013 1543   CL 98 08/03/2022 0229   CL 102 04/29/2013 1543   CO2 24 08/03/2022 0229   CO2 27 04/29/2013 1543   GLUCOSE 92 08/03/2022 0229   GLUCOSE 120 (H) 04/29/2013 1543   BUN 20 08/03/2022 0229   BUN 17 04/29/2013 1543   CREATININE 1.45 (H) 08/03/2022 0229   CREATININE 2.47 (H) 09/09/2020 1343   CALCIUM 8.9 08/03/2022 0229   CALCIUM 9.8 04/29/2013 1543   PROT 5.8 (L) 08/03/2022 0229   PROT 7.9 04/29/2013 1543   ALBUMIN 3.0 (L) 08/03/2022 0229   ALBUMIN 3.8 04/29/2013 1543   AST 24  08/03/2022 0229   AST 30 04/29/2013 1543   ALT 10 08/03/2022 0229   ALT 24 04/29/2013 1543   ALKPHOS 59 08/03/2022 0229   ALKPHOS 79 04/29/2013 1543   BILITOT 0.4 08/03/2022 0229   BILITOT 0.4 04/29/2013 1543   GFRNONAA 46 (L) 08/03/2022 0229   GFRNONAA >60 04/29/2013 1543   GFRAA 42 (L) 05/04/2017 1945   GFRAA >60 04/29/2013 1543   Lipase     Component Value Date/Time   LIPASE 34 08/01/2022 0849   LIPASE 154 04/29/2013 1543       Studies/Results: MR ABDOMEN MRCP W WO CONTAST  Result Date: 08/01/2022 CLINICAL DATA:  Cholelithiasis EXAM: MRI ABDOMEN WITHOUT AND WITH CONTRAST (INCLUDING MRCP) TECHNIQUE: Multiplanar multisequence MR imaging of the abdomen was performed both before and after the administration of intravenous contrast. Heavily T2-weighted images of the biliary and pancreatic ducts were obtained, and three-dimensional MRCP images were rendered by post processing. CONTRAST:  6.79m GADAVIST GADOBUTROL 1 MMOL/ML IV SOLN COMPARISON:  Right upper quadrant ultrasound, 08/01/2022, CT abdomen pelvis, 10/10/2021 FINDINGS: Lower chest: No acute abnormality. Hepatobiliary: No solid liver abnormality is seen. Status post cholecystectomy. Small gallbladder remnant with adjacent gallstones in the gallbladder neck and cystic  duct measuring 0.7 and 0.8 cm (series 3, image 26). Intra and extrahepatic biliary ductal dilatation is unchanged comparison to prior CT, the common bile duct measuring up to 0.7 cm in caliber. No obstructing calculus or other abnormality identified to the ampulla. Pancreas: Unremarkable. No pancreatic ductal dilatation or surrounding inflammatory changes. Spleen: Normal in size without significant abnormality. Adrenals/Urinary Tract: Adrenal glands are unremarkable. Simple, benign bilateral renal cortical cysts, for which no further follow-up or characterization is required. Kidneys are otherwise normal, without renal calculi, solid lesion, or hydronephrosis.  Stomach/Bowel: Stomach is within normal limits. No evidence of bowel wall thickening, distention, or inflammatory changes. Vascular/Lymphatic: No significant vascular findings are present. No enlarged abdominal lymph nodes. Other: No abdominal wall hernia or abnormality. Small volume inflammatory fluid adjacent to the gallbladder remnant and descending duodenum (series 4, image 19). Musculoskeletal: No acute or significant osseous findings. IMPRESSION: 1. Status post cholecystectomy. Small gallbladder remnant with adjacent gallstones in the gallbladder neck and cystic duct measuring 0.7 and 0.8 cm. 2. Intra and extrahepatic biliary ductal dilatation is unchanged comparison to prior CT, the common bile duct measuring up to 0.7 cm in caliber. No obstructing calculus or other abnormality identified to the ampulla. 3. Small volume inflammatory fluid adjacent to the gallbladder remnant and descending duodenum, of uncertain etiology. Electronically Signed   By: Delanna Ahmadi M.D.   On: 08/01/2022 16:16    Anti-infectives: Anti-infectives (From admission, onward)    Start     Dose/Rate Route Frequency Ordered Stop   08/01/22 2015  cefTRIAXone (ROCEPHIN) 2 g in sodium chloride 0.9 % 100 mL IVPB        2 g 200 mL/hr over 30 Minutes Intravenous Every 24 hours 08/01/22 2006     08/01/22 2015  metroNIDAZOLE (FLAGYL) IVPB 500 mg        500 mg 100 mL/hr over 60 Minutes Intravenous Every 12 hours 08/01/22 2006          Assessment/Plan  Cholelithiasis and chronic cholecystitis  - pt seen previously in 2022 but had C. Diff colitis at that time and it was recommended he get over C. Diff prior to consideration for laparoscopic cholecystectomy. It appears he may have been lost to follow up - will confirm with patient's daughter later today  - WBC normalized, Afebrile and HD stable - LFTs not elevated - gallbladder is very contracted with stones impacted in the neck on imaging and cholecystectomy may be very  difficult, recommend IV abx for now. If patient does not improve may need to consider cholecystectomy. I do not currently think IR drainage would be an option given that gallbladder is very contracted  FEN: low fat diet, IVF per [facility] VTE: [facility] ID: rocephin/flagyl 12/20>>  - Below per [facility] -  CAD status post stenting to RCA in 4098 Combined systolic and diastolic CHF COPD CKD stage IIIa Hypertension Hyperlipidemia BPH PAF not on anticoagulation  LOS: 2 days     Norm Parcel, [facility] 08/03/2022, 11:02 AM Please see Amion for pager number during day hours 7:00am-4:30pm

## 2022-08-03 NOTE — Progress Notes (Signed)
PROGRESS NOTE  Leon Estes NOB:[id number] DOB: 1931/02/08 DOA: 08/01/2022 PCP: Viviana Simpler I, MD   LOS: 2 days   Brief Narrative / Interim history: 88 year old male with COPD, PAF not on anticoagulation due to anemia, CAD status post remote PCI, chronic combined CHF, CKD 3A, HTN, HLD comes into the hospital with right upper quadrant abdominal pain.  He had similar history with a right upper quadrant ultrasound in October 2023 showing cholelithiasis without cholecystitis, and also on urgent care visit December 2022.  Imaging with MRCP this admission showed small gallbladder with adjacent gallstones in the gallbladder neck and cystic duct.  General surgery was consulted  Subjective / 24h Interval events: Hard of hearing.  Denies any abdominal pain  Assesement and Plan: Principal Problem:   Cholecystitis, unspecified Active Problems:   Chronic combined systolic (congestive) and diastolic (congestive) heart failure (HCC)   Chronic kidney disease, stage 3a (HCC)   PAF (paroxysmal atrial fibrillation) (HCC)   Benign essential HTN   CAD (coronary artery disease)   COPD (chronic obstructive pulmonary disease) (HCC)   Hyperlipidemia   BPH (benign prostatic hyperplasia)   Principal problem Cholecystitis, likely acute on chronic -patient comes in with cholecystitis symptoms, also significant cholelithiasis.  He has been having symptoms as an outpatient before.  Appreciate general surgery follow-up.  He has been placed on antibiotics, continue.  White count normalized this morning  Active problems Chronic combined CHF-euvolemic this morning.  Last EF improved 45 to 50% from previous of 30%.  Continue Coreg, hold furosemide and losartan this morning due to slight creatinine elevation.  No recent anginal symptoms or CHF decompensation.  Prior C. difficile-monitor closely, currently no diarrhea.  He is on antibiotics as per #1  CKD 3A-baseline creatinine 1.2-1.3, slightly up at 1.4 this  morning, hold furosemide and losartan as above  PAF-looks remote, currently in sinus rhythm.  Not on anticoagulation.  Follows with cardiology as an outpatient  CAD-no chest pain, continue Coreg, statin  COPD-stable, no wheezing, continue Symbicort and albuterol  BPH-continue Flomax  HLD-continue statin   Scheduled Meds:  carvedilol  3.125 mg Oral BID WC   heparin  5,000 Units Subcutaneous Q8H   mometasone-formoterol  2 puff Inhalation BID   QUEtiapine  25 mg Oral QHS   simvastatin  40 mg Oral QHS   tamsulosin  0.4 mg Oral QPC supper   Continuous Infusions:  cefTRIAXone (ROCEPHIN)  IV Stopped (08/02/22 2017)   metronidazole Stopped (08/02/22 2038)   PRN Meds:.acetaminophen **OR** acetaminophen, bisacodyl, morphine injection, prochlorperazine, senna-docusate  Current Outpatient Medications  Medication Instructions   acetaminophen (TYLENOL) 1,000 mg, Oral, Every 6 hours PRN   Aspirin Low Dose 81 mg, Oral, Daily, Swallow whole.   budesonide-formoterol (SYMBICORT) 160-4.5 MCG/ACT inhaler 2 puffs, Inhalation, 2 times daily   carvedilol (COREG) 3.125 mg, Oral, 2 times daily with meals   furosemide (LASIX) 20 mg, Oral, Daily   gabapentin (NEURONTIN) 400 MG capsule TAKE 1 CAPSULE BY MOUTH EVERY NIGHT AT BEDTIME   losartan (COZAAR) 12.5 mg, Oral, Daily   Multiple Vitamin (MULTIVITAMIN WITH MINERALS) TABS tablet 1 tablet, Oral, Daily   omeprazole (PRILOSEC) 20 mg, Oral, Daily   potassium chloride (KLOR-CON) 10 MEQ tablet 10 mEq, Oral, Daily   QUEtiapine (SEROQUEL) 25 mg, Oral, Daily at bedtime   simvastatin (ZOCOR) 40 mg, Oral, Daily at bedtime   tamsulosin (FLOMAX) 0.4 MG CAPS capsule TAKE 1 CAPSULE BY MOUTH ONCE DAILY   traMADol (ULTRAM) 50 MG tablet TAKE 1 TABLET BY  MOUTH BY MOUTH DAILY ASNEEDED FOR PAIN   triamcinolone cream (KENALOG) 0.1 % 1 Application, Topical, 2 times daily PRN    Diet Orders (From admission, onward)     Start     Ordered   08/02/22 0932  Diet full  liquid Room service appropriate? Yes; Fluid consistency: Thin  Diet effective now       Question Answer Comment  Room service appropriate? Yes   Fluid consistency: Thin      08/02/22 0931            DVT prophylaxis: heparin injection 5,000 Units Start: 08/01/22 2200   Lab Results  Component Value Date   PLT 192 08/03/2022      Code Status: DNR  Family Communication: No family at bedside  Status is: Inpatient  Remains inpatient appropriate because: Severity of illness   Level of care: Med-Surg  Consultants:  General surgery  Objective: Vitals:   08/02/22 1634 08/02/22 2026 08/03/22 0500 08/03/22 0523  BP: 118/67 (!) 115/50  (!) 101/51  Pulse: 99 91  78  Resp: '18 17  16  '$ Temp: 98.8 F (37.1 C) 98.1 F (36.7 C)  98 F (36.7 C)  TempSrc: Oral Oral  Oral  SpO2: 97% 97%  95%  Weight:  65.3 kg 65.1 kg   Height:        Intake/Output Summary (Last 24 hours) at 08/03/2022 3664 Last data filed at 08/03/2022 0558 Gross per 24 hour  Intake 660 ml  Output 100 ml  Net 560 ml    Wt Readings from Last 3 Encounters:  08/03/22 65.1 kg  07/02/22 66.2 kg  05/23/22 66.7 kg    Examination:  Constitutional: NAD Eyes: lids and conjunctivae normal, no scleral icterus ENMT: mmm Neck: normal, supple Respiratory: clear to auscultation bilaterally, no wheezing, no crackles. Normal respiratory effort.  Cardiovascular: Regular rate and rhythm, no murmurs / rubs / gallops. No LE edema. Abdomen: soft, no distention, no tenderness. Bowel sounds positive.  Skin: no rashes Neurologic: no focal deficits, equal strength   Data Reviewed: I have independently reviewed following labs and imaging studies   CBC Recent Labs  Lab 08/01/22 0849 08/02/22 0316 08/03/22 0229  WBC 13.4* 16.2* 10.1  HGB 11.3* 13.0 9.9*  HCT 38.6* 41.2 32.1*  PLT 253 282 192  MCV 87.5 83.7 84.0  MCH 25.6* 26.4 25.9*  MCHC 29.3* 31.6 30.8  RDW 17.2* 17.2* 17.2*     Recent Labs  Lab  08/01/22 0849 08/02/22 0316 08/03/22 0229  NA 138 135 133*  K 3.6 4.5 3.7  CL 103 97* 98  CO2 '24 27 24  '$ GLUCOSE 137* 129* 92  BUN '11 10 20  '$ CREATININE 1.03 1.12 1.45*  CALCIUM 9.5 9.6 8.9  AST '25 29 24  '$ ALT '16 17 10  '$ ALKPHOS 69 86 59  BILITOT 0.7 0.6 0.4  ALBUMIN 3.9 3.9 3.0*  MG  --   --  1.7     ------------------------------------------------------------------------------------------------------------------ No results for input(s): "CHOL", "HDL", "LDLCALC", "TRIG", "CHOLHDL", "LDLDIRECT" in the last 72 hours.  Lab Results  Component Value Date   HGBA1C 6.5 (H) 01/06/2021   ------------------------------------------------------------------------------------------------------------------ No results for input(s): "TSH", "T4TOTAL", "T3FREE", "THYROIDAB" in the last 72 hours.  Invalid input(s): "FREET3"  Cardiac Enzymes No results for input(s): "CKMB", "TROPONINI", "MYOGLOBIN" in the last 168 hours.  Invalid input(s): "CK" ------------------------------------------------------------------------------------------------------------------    Component Value Date/Time   BNP 689.3 (H) 12/27/2020 1754    CBG: No results for input(s): "GLUCAP"  in the last 168 hours.  No results found for this or any previous visit (from the past 240 hour(s)).   Radiology Studies: No results found.   Marzetta Board, MD, PhD [facility]  Between 7 am - 7 pm I am available, please contact me via Amion (for emergencies) or Securechat (non urgent messages)  Between 7 pm - 7 am I am not available, please contact night coverage MD/APP via Amion

## 2022-08-04 DIAGNOSIS — K819 Cholecystitis, unspecified: Secondary | ICD-10-CM | POA: Diagnosis not present

## 2022-08-04 LAB — COMPREHENSIVE METABOLIC PANEL
ALT: 15 U/L (ref 0–44)
AST: 25 U/L (ref 15–41)
Albumin: 3.1 g/dL — ABNORMAL LOW (ref 3.5–5.0)
Alkaline Phosphatase: 55 U/L (ref 38–126)
Anion gap: 9 (ref 5–15)
BUN: 26 mg/dL — ABNORMAL HIGH (ref 8–23)
CO2: 26 mmol/L (ref 22–32)
Calcium: 9.1 mg/dL (ref 8.9–10.3)
Chloride: 101 mmol/L (ref 98–111)
Creatinine, Ser: 1.31 mg/dL — ABNORMAL HIGH (ref 0.61–1.24)
GFR, Estimated: 52 mL/min — ABNORMAL LOW (ref >60)
Glucose, Bld: 106 mg/dL — ABNORMAL HIGH (ref 70–99)
Potassium: 4 mmol/L (ref 3.5–5.1)
Sodium: 136 mmol/L (ref 135–145)
Total Bilirubin: 0.4 mg/dL (ref 0.3–1.2)
Total Protein: 6.1 g/dL — ABNORMAL LOW (ref 6.5–8.1)

## 2022-08-04 LAB — CBC
HCT: 31.8 % — ABNORMAL LOW (ref 39.0–52.0)
Hemoglobin: 9.7 g/dL — ABNORMAL LOW (ref 13.0–17.0)
MCH: 25.6 pg — ABNORMAL LOW (ref 26.0–34.0)
MCHC: 30.5 g/dL (ref 30.0–36.0)
MCV: 83.9 fL (ref 80.0–100.0)
Platelets: 205 10*3/uL (ref 150–400)
RBC: 3.79 MIL/uL — ABNORMAL LOW (ref 4.22–5.81)
RDW: 17.3 % — ABNORMAL HIGH (ref 11.5–15.5)
WBC: 9 10*3/uL (ref 4.0–10.5)
nRBC: 0 % (ref 0.0–0.2)

## 2022-08-04 LAB — MAGNESIUM: Magnesium: 1.8 mg/dL (ref 1.7–2.4)

## 2022-08-04 MED ORDER — CEFDINIR 300 MG PO CAPS: 300.0000 mg | ORAL_CAPSULE | Freq: Two times a day (BID) | ORAL | 0 refills | Status: DC

## 2022-08-04 MED ORDER — METRONIDAZOLE 500 MG PO TABS: 500.0000 mg | ORAL_TABLET | Freq: Two times a day (BID) | ORAL | 0 refills | Status: DC

## 2022-08-04 NOTE — Evaluation (Signed)
Physical Therapy Evaluation Patient Details Name: Leon Estes MRN: [id number] DOB: January 25, 1931 Today's Date: 08/04/2022  History of Present Illness  88 yo admitted 12/20 with epigastric pain due to cholelithiasis and chronic cholecystitis . PMhx: CAD, COPD, CKD, HTN, HLD, BPH, PAF  Clinical Impression  Pt pleasantly confused with decreased gait and balance from baseline per family. Pt with decreased activity tolerance who will benefit from acute therapy to maximize mobility, safety and independence to decrease burden of care. Pt lives at home with spouse with needed DME at home. Encouraged OOB to chair and walking with staff.        Recommendations for follow up therapy are one component of a multi-disciplinary discharge planning process, led by the attending physician.  Recommendations may be updated based on patient status, additional functional criteria and insurance authorization.  Follow Up Recommendations Home health PT      Assistance Recommended at Discharge Frequent or constant Supervision/Assistance  Patient can return home with the following  A little help with walking and/or transfers;A little help with bathing/dressing/bathroom;Assistance with cooking/housework;Direct supervision/assist for medications management;Assist for transportation    Equipment Recommendations None recommended by PT  Recommendations for Other Services       Functional Status Assessment Patient has had a recent decline in their functional status and demonstrates the ability to make significant improvements in function in a reasonable and predictable amount of time.     Precautions / Restrictions Precautions Precautions: Fall Restrictions Weight Bearing Restrictions: No      Mobility  Bed Mobility Overal bed mobility: Needs Assistance Bed Mobility: Supine to Sit     Supine to sit: Supervision     General bed mobility comments: supervision for lines with increased time to achieve  sitting    Transfers Overall transfer level: Needs assistance   Transfers: Sit to/from Stand Sit to Stand: Min guard           General transfer comment: cues for hand placement with guarding for safety to stand    Ambulation/Gait Ambulation/Gait assistance: Min guard Gait Distance (Feet): 300 Feet Assistive device: Rolling walker (2 wheels) Gait Pattern/deviations: Step-through pattern, Decreased stride length, Trunk flexed   Gait velocity interpretation: 1.31 - 2.62 ft/sec, indicative of limited community ambulator   General Gait Details: cues for direction, proximity to RW and posture  Stairs            Wheelchair Mobility    Modified Rankin (Stroke Patients Only)       Balance Overall balance assessment: Needs assistance   Sitting balance-Leahy Scale: Fair     Standing balance support: Reliant on assistive device for balance, Bilateral upper extremity supported Standing balance-Leahy Scale: Poor Standing balance comment: Rw for gait                             Pertinent Vitals/Pain Pain Assessment Pain Assessment: No/denies pain    Home Living Family/patient expects to be discharged to:: Private residence Living Arrangements: Spouse/significant other Available Help at Discharge: Family;Available 24 hours/day Type of Home: House Home Access: Stairs to enter   [employer] of Steps: 3   Home Layout: One level Home Equipment: Conservation officer, nature (2 wheels);Rollator (4 wheels);Cane - single point;BSC/3in1      Prior Function Prior Level of Function : Needs assist             Mobility Comments: walks with RW at times ADLs Comments: wife assists with bathing, dressing  and getting in/out of tub     Hand Dominance        Extremity/Trunk Assessment   Upper Extremity Assessment Upper Extremity Assessment: Overall WFL for tasks assessed    Lower Extremity Assessment Lower Extremity Assessment: Overall WFL for tasks  assessed    Cervical / Trunk Assessment Cervical / Trunk Assessment: Kyphotic  Communication   Communication: HOH  Cognition Arousal/Alertness: Awake/alert Behavior During Therapy: Flat affect Overall Cognitive Status: Impaired/Different from baseline Area of Impairment: Memory, Safety/judgement                     Memory: Decreased short-term memory   Safety/Judgement: Decreased awareness of deficits              General Comments      Exercises     Assessment/Plan    PT Assessment Patient needs continued PT services  PT Problem List Decreased mobility;Decreased safety awareness;Decreased activity tolerance;Decreased cognition;Decreased balance;Decreased knowledge of use of DME       PT Treatment Interventions Gait training;Therapeutic exercise;Patient/family education;Stair training;Functional mobility training;DME instruction;Therapeutic activities    PT Goals (Current goals can be found in the Care Plan section)  Acute Rehab PT Goals Patient Stated Goal: return home, "mow the yard" PT Goal Formulation: With patient Time For Goal Achievement: 08/18/22 Potential to Achieve Goals: Good    Frequency Min 2X/week     Co-evaluation               AM-PAC PT "6 Clicks" Mobility  Outcome Measure Help needed turning from your back to your side while in a flat bed without using bedrails?: None Help needed moving from lying on your back to sitting on the side of a flat bed without using bedrails?: None Help needed moving to and from a bed to a chair (including a wheelchair)?: A Little Help needed standing up from a chair using your arms (e.g., wheelchair or bedside chair)?: A Little Help needed to walk in hospital room?: A Little Help needed climbing 3-5 steps with a railing? : A Lot 6 Click Score: 19    End of Session Equipment Utilized During Treatment: Gait belt Activity Tolerance: Patient tolerated treatment well Patient left: in chair;with call  bell/phone within reach;with chair alarm set Nurse Communication: Mobility status PT Visit Diagnosis: Other abnormalities of gait and mobility (R26.89)    Time: 1610-9604 PT Time Calculation (min) (ACUTE ONLY): 17 min   Charges:   PT Evaluation $PT Eval Moderate Complexity: 1 Mod          [place], PT Acute Rehabilitation Services Office: [phone]   Leon Estes 08/04/2022, 12:44 PM

## 2022-08-04 NOTE — TOC Transition Note (Signed)
Transition of Care [facility]) - CM/SW Discharge Note   Patient Details  Name: Leon Estes MRN: [id number] Date of Birth: Aug 09, 1931  [facility] CM/SW Contact:  Bartholomew Crews, RN Phone Number: [phone] 08/04/2022, 3:03 PM   Clinical Narrative:     Spoke with patient on hospital room phone to discuss post acute transition. Patient consented to discuss transition plans with his family. Unable to reach family at this time. Patient is agreeable to [facility] PT. Referral accepted by [facility]. Family to provide transportation home. No further TOC needs identified at this time.   Final next level of care: Home w Home Health Services Barriers to Discharge: No Barriers Identified   Patient Goals and CMS Choice CMS Medicare.gov Compare Post Acute Care list provided to:: Patient Choice offered to / list presented to : Patient  Discharge Placement                         Discharge Plan and Services Additional resources added to the After Visit Summary for                  DME Arranged: N/A DME Agency: NA       HH Arranged: PT HH Agency: [place] Date [place]: 08/04/22 Time [place]: [id number] Representative spoke with at [place]: [place] Determinants of Health ([place]) Interventions SDOH Screenings   Depression (PHQ2-9): Low Risk  (12/04/2021)  Tobacco Use: High Risk (08/01/2022)     Readmission Risk Interventions     No data to display

## 2022-08-04 NOTE — Discharge Summary (Signed)
Physician Discharge Summary   Patient: Leon Estes MRN: [id number]  DOB: [birth year]   Admit:     Date of Admission: 08/01/2022 Admitted from: home   Discharge: Date of discharge: 08/04/22 Disposition: Home Condition at discharge: good  CODE STATUS: DNR     Discharge Physician: Emeterio Reeve, DO [facility]     PCP: Venia Carbon, MD  Recommendations for Outpatient Follow-up:  Follow up with PCP Viviana Simpler I, MD in 2-4 weeks Please obtain labs/tests: CMP, CBC in 2-4 weeks Please follow up on the following pending results: none PCP AND OTHER OUTPATIENT PROVIDERS: SEE BELOW FOR SPECIFIC DISCHARGE INSTRUCTIONS PRINTED FOR PATIENT IN ADDITION TO GENERIC AVS PATIENT INFO    Discharge Instructions     Call MD for:  persistant nausea and vomiting   Complete by: As directed    Call MD for:  severe uncontrolled pain   Complete by: As directed    Call MD for:  temperature >100.4   Complete by: As directed    Diet - low sodium heart healthy   Complete by: As directed    Discharge instructions   Complete by: As directed    Finish antibiotics.  If severe pain and/or fever, please return to hospital!  Holding losartan (blood pressure medication) and lasix (fluids pill) for a few days then can restart these on Monday.   Increase activity slowly   Complete by: As directed          Discharge Diagnoses: Principal Problem:   Cholecystitis, unspecified Active Problems:   Chronic combined systolic (congestive) and diastolic (congestive) heart failure (HCC)   Chronic kidney disease, stage 3a (HCC)   PAF (paroxysmal atrial fibrillation) (HCC)   Benign essential HTN   CAD (coronary artery disease)   COPD (chronic obstructive pulmonary disease) (HCC)   Hyperlipidemia   BPH (benign prostatic hyperplasia)       Hospital Course: 88 year old male with COPD, PAF not on anticoagulation due to anemia, CAD status post remote PCI, chronic combined  CHF, CKD 3A, HTN, HLD comes into the hospital 08/01/22 with right upper quadrant abdominal pain.  He had similar history with a right upper quadrant ultrasound in October 2023 showing cholelithiasis without cholecystitis, and also on urgent care visit December 2022.  Imaging with MRCP this admission showed small gallbladder with adjacent gallstones in the gallbladder neck and cystic duct.  General surgery was consulted - defer surgical intervention, pt was placed on antibiotics. WBC normalizing. Tolerating full liquid diet as of 12/22 and advanced to low fat diet as tolerated. Walked w/ PT, stable fir discharge, has all needed equipment at home.   Consultants:  General Surgery   Procedures: None       ASSESSMENT & PLAN:   Principal Problem:   Cholecystitis, unspecified Active Problems:   Chronic combined systolic (congestive) and diastolic (congestive) heart failure (HCC)   Chronic kidney disease, stage 3a (HCC)   PAF (paroxysmal atrial fibrillation) (HCC)   Benign essential HTN   CAD (coronary artery disease)   COPD (chronic obstructive pulmonary disease) (HCC)   Hyperlipidemia   BPH (benign prostatic hyperplasia)  Cholecystitis, likely acute on chronic -patient comes in with cholecystitis symptoms, also significant cholelithiasis.  He has been having symptoms as an outpatient before.  White count normalized yesterday morning Appreciate general surgery recs.   He has been placed on antibiotics, continue.     Chronic combined CHF euvolemic.   Last EF improved 45 to 50% from previous  of 30%.   No recent anginal symptoms or CHF decompensation. Continue Coreg hold furosemide and losartan given low BP and mild reduction renal function    Prior C. Difficile monitor closely, currently no diarrhea.     CKD 3A baseline creatinine 1.2-1.3, slightly up at 1.4 yesterday morning, hold furosemide and losartan as above   PAF looks remote, currently in sinus rhythm.   Not on  anticoagulation.   Follows with cardiology as an outpatient   CAD no chest pain continue Coreg, statin   COPD stable, no wheezing continue Symbicort and albuterol   BPH continue Flomax   HLD continue statin               Discharge Instructions  Allergies as of 08/04/2022       Reactions   Augmentin [amoxicillin-pot Clavulanate] Rash   Bacitracin-polymyxin B Swelling, Rash   Benzalkonium Chloride    "Benzalkonium chloride is a quaternary ammonium antiseptic and disinfectant with actions and uses similar to those of other cationic surfactants. It is also used as an antimicrobial preservative for pharmaceutical products."   Neosporin [neomycin-bacitracin Zn-polymyx] Rash   Sulfamethoxazole-trimethoprim Rash   Terbinafine And Related Rash        Medication List     TAKE these medications    acetaminophen 500 MG tablet Commonly known as: TYLENOL Take 1,000 mg by mouth every 6 (six) hours as needed for moderate pain or headache.   Aspirin Low Dose 81 MG tablet Generic drug: aspirin EC Take 1 tablet (81 mg total) by mouth daily. Swallow whole.   budesonide-formoterol 160-4.5 MCG/ACT inhaler Commonly known as: SYMBICORT Inhale 2 puffs into the lungs 2 (two) times daily.   carvedilol 3.125 MG tablet Commonly known as: COREG Take 1 tablet (3.125 mg total) by mouth 2 (two) times daily with a meal.   cefdinir 300 MG capsule Commonly known as: OMNICEF Take 1 capsule (300 mg total) by mouth 2 (two) times daily for 5 days.   furosemide 20 MG tablet Commonly known as: LASIX Take 1 tablet (20 mg total) by mouth daily.   gabapentin 400 MG capsule Commonly known as: NEURONTIN TAKE 1 CAPSULE BY MOUTH EVERY NIGHT AT BEDTIME   losartan 25 MG tablet Commonly known as: COZAAR Take 0.5 tablets (12.5 mg total) by mouth daily.   metroNIDAZOLE 500 MG tablet Commonly known as: FLAGYL Take 1 tablet (500 mg total) by mouth 2 (two) times daily for 5 days.    multivitamin with minerals Tabs tablet Take 1 tablet by mouth daily.   omeprazole 20 MG capsule Commonly known as: PRILOSEC Take 1 capsule (20 mg total) by mouth daily.   potassium chloride 10 MEQ tablet Commonly known as: KLOR-CON Take 1 tablet (10 mEq total) by mouth daily.   QUEtiapine 25 MG tablet Commonly known as: SEROQUEL Take 1 tablet (25 mg total) by mouth at bedtime.   simvastatin 40 MG tablet Commonly known as: ZOCOR Take 1 tablet (40 mg total) by mouth at bedtime.   tamsulosin 0.4 MG Caps capsule Commonly known as: FLOMAX TAKE 1 CAPSULE BY MOUTH ONCE DAILY What changed:  how to take this when to take this   traMADol 50 MG tablet Commonly known as: ULTRAM TAKE 1 TABLET BY MOUTH BY MOUTH DAILY ASNEEDED FOR PAIN What changed: See the new instructions.   triamcinolone cream 0.1 % Commonly known as: KENALOG Apply 1 Application topically 2 (two) times daily as needed. What changed: reasons to take this  Follow-up Information     Ileana Roup, MD Follow up in 1 month(s).   Specialties: General Surgery, Colon and Rectal Surgery Contact information: [address] [place] [address] [phone]                 Allergies  Allergen Reactions   Augmentin [Amoxicillin-Pot Clavulanate] Rash   Bacitracin-Polymyxin B Swelling and Rash   Benzalkonium Chloride     "Benzalkonium chloride is a quaternary ammonium antiseptic and disinfectant with actions and uses similar to those of other cationic surfactants. It is also used as an antimicrobial preservative for pharmaceutical products."   Neosporin [Neomycin-Bacitracin Zn-Polymyx] Rash   Sulfamethoxazole-Trimethoprim Rash   Terbinafine And Related Rash     Subjective: patient and family have no cconerns on rounds, he denies pain, toelrating diet, ambulating independently   Discharge Exam: BP (!) 138/58 (BP Location: Left Arm)   Pulse 74   Temp 98.2 F (36.8  C) (Oral)   Resp 16   Ht '5\' 6"'$  (1.676 m)   Wt 63.3 kg   SpO2 100%   BMI 22.52 kg/m  General: Pt is alert, awake, not in acute distress Cardiovascular: RRR, S1/S2 +, no rubs, no gallops Respiratory: CTA bilaterally, no wheezing, no rhonchi Abdominal: Soft, NT, ND, bowel sounds + Extremities: no edema, no cyanosis     The results of significant diagnostics from this hospitalization (including imaging, microbiology, ancillary and laboratory) are listed below for reference.     Microbiology: No results found for this or any previous visit (from the past 240 hour(s)).   Labs: BNP (last 3 results) No results for input(s): "BNP" in the last 8760 hours. Basic Metabolic Panel: Recent Labs  Lab 08/01/22 0849 08/02/22 0316 08/03/22 0229 08/04/22 0157  NA 138 135 133* 136  K 3.6 4.5 3.7 4.0  CL 103 97* 98 101  CO2 '24 27 24 26  '$ GLUCOSE 137* 129* 92 106*  BUN '11 10 20 '$ 26*  CREATININE 1.03 1.12 1.45* 1.31*  CALCIUM 9.5 9.6 8.9 9.1  MG  --   --  1.7 1.8   Liver Function Tests: Recent Labs  Lab 08/01/22 0849 08/02/22 0316 08/03/22 0229 08/04/22 0157  AST '25 29 24 25  '$ ALT '16 17 10 15  '$ ALKPHOS 69 86 59 55  BILITOT 0.7 0.6 0.4 0.4  PROT 7.3 7.7 5.8* 6.1*  ALBUMIN 3.9 3.9 3.0* 3.1*   Recent Labs  Lab 08/01/22 0849  LIPASE 34   No results for input(s): "AMMONIA" in the last 168 hours. CBC: Recent Labs  Lab 08/01/22 0849 08/02/22 0316 08/03/22 0229 08/04/22 0157  WBC 13.4* 16.2* 10.1 9.0  HGB 11.3* 13.0 9.9* 9.7*  HCT 38.6* 41.2 32.1* 31.8*  MCV 87.5 83.7 84.0 83.9  PLT 253 282 192 205   Cardiac Enzymes: No results for input(s): "CKTOTAL", "CKMB", "CKMBINDEX", "TROPONINI" in the last 168 hours. BNP: Invalid input(s): "POCBNP" CBG: No results for input(s): "GLUCAP" in the last 168 hours. D-Dimer No results for input(s): "DDIMER" in the last 72 hours. Hgb A1c No results for input(s): "HGBA1C" in the last 72 hours. Lipid Profile No results for input(s):  "CHOL", "HDL", "LDLCALC", "TRIG", "CHOLHDL", "LDLDIRECT" in the last 72 hours. Thyroid function studies No results for input(s): "TSH", "T4TOTAL", "T3FREE", "THYROIDAB" in the last 72 hours.  Invalid input(s): "FREET3" Anemia work up No results for input(s): "VITAMINB12", "FOLATE", "FERRITIN", "TIBC", "IRON", "RETICCTPCT" in the last 72 hours. Urinalysis    Component Value Date/Time  COLORURINE YELLOW 08/01/2022 2038   APPEARANCEUR TURBID (A) 08/01/2022 2038   APPEARANCEUR Clear 04/29/2013 1543   LABSPEC 1.017 08/01/2022 2038   LABSPEC 1.020 04/29/2013 1543   PHURINE 8.0 08/01/2022 2038   GLUCOSEU NEGATIVE 08/01/2022 2038   GLUCOSEU Negative 04/29/2013 1543   HGBUR SMALL (A) 08/01/2022 2038   BILIRUBINUR NEGATIVE 08/01/2022 2038   BILIRUBINUR Negative 03/14/2022 1506   BILIRUBINUR Negative 04/29/2013 1543   KETONESUR 5 (A) 08/01/2022 2038   PROTEINUR >=300 (A) 08/01/2022 2038   UROBILINOGEN 0.2 03/14/2022 1506   UROBILINOGEN 0.2 03/08/2012 1624   NITRITE NEGATIVE 08/01/2022 2038   LEUKOCYTESUR NEGATIVE 08/01/2022 2038   LEUKOCYTESUR Negative 04/29/2013 1543   Sepsis Labs Recent Labs  Lab 08/01/22 0849 08/02/22 0316 08/03/22 0229 08/04/22 0157  WBC 13.4* 16.2* 10.1 9.0   Microbiology No results found for this or any previous visit (from the past 240 hour(s)). Imaging MR ABDOMEN MRCP W WO CONTAST  Result Date: 08/01/2022 CLINICAL DATA:  Cholelithiasis EXAM: MRI ABDOMEN WITHOUT AND WITH CONTRAST (INCLUDING MRCP) TECHNIQUE: Multiplanar multisequence MR imaging of the abdomen was performed both before and after the administration of intravenous contrast. Heavily T2-weighted images of the biliary and pancreatic ducts were obtained, and three-dimensional MRCP images were rendered by post processing. CONTRAST:  6.12m GADAVIST GADOBUTROL 1 MMOL/ML IV SOLN COMPARISON:  Right upper quadrant ultrasound, 08/01/2022, CT abdomen pelvis, 10/10/2021 FINDINGS: Lower chest: No acute  abnormality. Hepatobiliary: No solid liver abnormality is seen. Status post cholecystectomy. Small gallbladder remnant with adjacent gallstones in the gallbladder neck and cystic duct measuring 0.7 and 0.8 cm (series 3, image 26). Intra and extrahepatic biliary ductal dilatation is unchanged comparison to prior CT, the common bile duct measuring up to 0.7 cm in caliber. No obstructing calculus or other abnormality identified to the ampulla. Pancreas: Unremarkable. No pancreatic ductal dilatation or surrounding inflammatory changes. Spleen: Normal in size without significant abnormality. Adrenals/Urinary Tract: Adrenal glands are unremarkable. Simple, benign bilateral renal cortical cysts, for which no further follow-up or characterization is required. Kidneys are otherwise normal, without renal calculi, solid lesion, or hydronephrosis. Stomach/Bowel: Stomach is within normal limits. No evidence of bowel wall thickening, distention, or inflammatory changes. Vascular/Lymphatic: No significant vascular findings are present. No enlarged abdominal lymph nodes. Other: No abdominal wall hernia or abnormality. Small volume inflammatory fluid adjacent to the gallbladder remnant and descending duodenum (series 4, image 19). Musculoskeletal: No acute or significant osseous findings. IMPRESSION: 1. Status post cholecystectomy. Small gallbladder remnant with adjacent gallstones in the gallbladder neck and cystic duct measuring 0.7 and 0.8 cm. 2. Intra and extrahepatic biliary ductal dilatation is unchanged comparison to prior CT, the common bile duct measuring up to 0.7 cm in caliber. No obstructing calculus or other abnormality identified to the ampulla. 3. Small volume inflammatory fluid adjacent to the gallbladder remnant and descending duodenum, of uncertain etiology. Electronically Signed   By: ADelanna AhmadiM.D.   On: 08/01/2022 16:16   UKoreaAbdomen Limited RUQ (LIVER/GB)  Result Date: 08/01/2022 CLINICAL DATA:   Epigastric pain. EXAM: ULTRASOUND ABDOMEN LIMITED RIGHT UPPER QUADRANT COMPARISON:  06/05/2022 FINDINGS: Gallbladder: The gallbladder is not confidently visualized and may be decompressed. Common bile duct: Diameter: 7 mm. There is a shadowing filling defect within the common bile duct which measures 1.2 x 0.9 cm. Liver: Increased parenchymal echogenicity. No focal lesion identified. Portal vein is patent on color Doppler imaging with normal direction of blood flow towards the liver. Other: None. IMPRESSION: 1. Decompressed gallbladder is not well  visualized. 2. Mildly increased caliber of the CBD measures 7 mm. Shadowing filling defect within the common bile duct is indeterminate. If there are clinical signs of choledocholithiasis consider more definitive characterization with MRI/MRCP. 3. Hepatic steatosis. Electronically Signed   By: Kerby Moors M.D.   On: 08/01/2022 11:07      Time coordinating discharge: over 30 minutes  SIGNED:  Emeterio Reeve DO [facility]

## 2022-08-04 NOTE — Progress Notes (Signed)
Progress Note     Subjective: Patient very confused an agitated this morning.  Told me he was "going to kick my ass" if I didn't stop examining him and get out of his room.  Unsure how well he is eating or about his pain as he won't answer my questions  Objective: Vital signs in last 24 hours: Temp:  [97.9 F (36.6 C)-98.2 F (36.8 C)] 98.2 F (36.8 C) (12/23 0831) Pulse Rate:  [73-84] 74 (12/23 0831) Resp:  [15-16] 16 (12/23 0831) BP: (116-149)/(58-76) 138/58 (12/23 0831) SpO2:  [97 %-100 %] 100 % (12/23 0831) Weight:  [63.3 kg] 63.3 kg (12/23 0500) Last BM Date : 07/31/22  Intake/Output from previous day: No intake/output data recorded. Intake/Output this shift: No intake/output data recorded.  PE: General: confused Abd: soft, unclear if tender as he won't tell me if palpation hurts.  He does not guard.   Lab Results:  Recent Labs    08/03/22 0229 08/04/22 0157  WBC 10.1 9.0  HGB 9.9* 9.7*  HCT 32.1* 31.8*  PLT 192 205   BMET Recent Labs    08/03/22 0229 08/04/22 0157  NA 133* 136  K 3.7 4.0  CL 98 101  CO2 24 26  GLUCOSE 92 106*  BUN 20 26*  CREATININE 1.45* 1.31*  CALCIUM 8.9 9.1   PT/INR No results for input(s): "LABPROT", "INR" in the last 72 hours. CMP     Component Value Date/Time   NA 136 08/04/2022 0157   NA 135 (L) 04/29/2013 1543   K 4.0 08/04/2022 0157   K 3.3 (L) 04/29/2013 1543   CL 101 08/04/2022 0157   CL 102 04/29/2013 1543   CO2 26 08/04/2022 0157   CO2 27 04/29/2013 1543   GLUCOSE 106 (H) 08/04/2022 0157   GLUCOSE 120 (H) 04/29/2013 1543   BUN 26 (H) 08/04/2022 0157   BUN 17 04/29/2013 1543   CREATININE 1.31 (H) 08/04/2022 0157   CREATININE 2.47 (H) 09/09/2020 1343   CALCIUM 9.1 08/04/2022 0157   CALCIUM 9.8 04/29/2013 1543   PROT 6.1 (L) 08/04/2022 0157   PROT 7.9 04/29/2013 1543   ALBUMIN 3.1 (L) 08/04/2022 0157   ALBUMIN 3.8 04/29/2013 1543   AST 25 08/04/2022 0157   AST 30 04/29/2013 1543   ALT 15 08/04/2022  0157   ALT 24 04/29/2013 1543   ALKPHOS 55 08/04/2022 0157   ALKPHOS 79 04/29/2013 1543   BILITOT 0.4 08/04/2022 0157   BILITOT 0.4 04/29/2013 1543   GFRNONAA 52 (L) 08/04/2022 0157   GFRNONAA >60 04/29/2013 1543   GFRAA 42 (L) 05/04/2017 1945   GFRAA >60 04/29/2013 1543   Lipase     Component Value Date/Time   LIPASE 34 08/01/2022 0849   LIPASE 154 04/29/2013 1543       Studies/Results: No results found.  Anti-infectives: Anti-infectives (From admission, onward)    Start     Dose/Rate Route Frequency Ordered Stop   08/01/22 2015  cefTRIAXone (ROCEPHIN) 2 g in sodium chloride 0.9 % 100 mL IVPB        2 g 200 mL/hr over 30 Minutes Intravenous Every 24 hours 08/01/22 2006     08/01/22 2015  metroNIDAZOLE (FLAGYL) IVPB 500 mg  Status:  Discontinued        500 mg 100 mL/hr over 60 Minutes Intravenous Every 12 hours 08/01/22 2006 08/04/22 1130        Assessment/Plan  Cholelithiasis and chronic cholecystitis  - pt seen previously in  2022 but had C. Diff colitis at that time and it was recommended he get over C. Diff prior to consideration for laparoscopic cholecystectomy. It appears he may have been lost to follow up - will confirm with patient's daughter later today  - WBC normalized, Afebrile and HD stable - LFTs not elevated - gallbladder is very contracted with stones impacted in the neck on imaging and cholecystectomy may be very difficult, recommend IV abx for now. If patient does not improve may need to consider cholecystectomy. I do not currently think IR drainage would be an option given that gallbladder is very contracted. -cont conservative management.  Would like to transition to oral abx, but he is PCN allergy and prolonged QT so Cipro is not recommended. -stop flagyl as it is not indicated for cholecystitis at this time, just rocephin.  FEN: low fat diet, IVF per [facility] VTE: [facility] ID: rocephin/flagyl 12/20>> flagyl stopped 12/23  - Below per [facility] -  CAD status  post stenting to RCA in 4403 Combined systolic and diastolic CHF COPD CKD stage IIIa Hypertension Hyperlipidemia BPH PAF not on anticoagulation  LOS: 3 days     Henreitta Cea, [facility] Surgery 08/04/2022, 11:30 AM Please see Amion for pager number during day hours 7:00am-4:30pm

## 2022-08-05 ENCOUNTER — Other Ambulatory Visit: Payer: Self-pay | Admitting: Osteopathic Medicine

## 2022-08-05 MED ORDER — CEFDINIR 300 MG PO CAPS: 300.0000 mg | ORAL_CAPSULE | Freq: Two times a day (BID) | ORAL | 0 refills | Status: DC

## 2022-08-05 MED ORDER — METRONIDAZOLE 500 MG PO TABS: 500.0000 mg | ORAL_TABLET | Freq: Two times a day (BID) | ORAL | 0 refills | Status: DC

## 2022-08-07 ENCOUNTER — Telehealth: Payer: Self-pay | Admitting: *Deleted

## 2022-08-07 ENCOUNTER — Other Ambulatory Visit: Payer: Self-pay | Admitting: Internal Medicine

## 2022-08-07 NOTE — Patient Outreach (Signed)
  Care Coordination [facility] Note Transition Care Management Follow-up Telephone Call Date of discharge and from where: [facility] [phone] How have you been since you were released from the hospital? Doing much better Any questions or concerns? Yes Per wife his feet are hurting. Wife made appointment with podiatry  Items Reviewed: Did the pt receive and understand the discharge instructions provided? Yes  Medications obtained and verified? Yes  Other? No  Any new allergies since your discharge? No  Dietary orders reviewed? Yes  Low sodium heart healthy Do you have support at home? Yes   Home Care and Equipment/Supplies: Were home health services ordered? yes If so, what is the name of the agency? [facility]  Has the agency set up a time to come to the patient's home? No and Patient is now declining that it is not needed Were any new equipment or medical supplies ordered?  No What is the name of the medical supply agency? N/a Were you able to get the supplies/equipment? not applicable Do you have any questions related to the use of the equipment or supplies? No  Functional Questionnaire: (I = Independent and D = Dependent) ADLs: I  Bathing/Dressing- I  Meal Prep- D  Eating- I  Maintaining continence- I  Transferring/Ambulation- I  Managing Meds- I  Follow up appointments reviewed:  PCP Hospital f/u appt confirmed? Yes  Dr Silvio Pate [id number] 9:30  [facility] f/u appt confirmed? Yes   Dr Dema Severin  [id number] 11:20 Gen surgeon Dr Cleda Mccreedy [phone] 1:45 podiatry Are transportation arrangements needed? No  If their condition worsens, is the pt aware to call PCP or go to the Emergency Dept.? Yes Was the patient provided with contact information for the PCP's office or ED? Yes Was to pt encouraged to call back with questions or concerns? Yes  SDOH assessments and interventions completed:   Yes SDOH Interventions Today    Flowsheet Row Most Recent Value  SDOH Interventions   Food  Insecurity Interventions Intervention Not Indicated  Housing Interventions Intervention Not Indicated  Transportation Interventions Intervention Not Indicated       Care Coordination Interventions:  No Care Coordination interventions needed at this time.   Encounter Outcome:  Pt. Visit Completed    [place] [facility] [phone]

## 2022-08-09 DIAGNOSIS — Z8719 Personal history of other diseases of the digestive system: Secondary | ICD-10-CM | POA: Diagnosis not present

## 2022-08-09 DIAGNOSIS — Z09 Encounter for follow-up examination after completed treatment for conditions other than malignant neoplasm: Secondary | ICD-10-CM | POA: Diagnosis not present

## 2022-08-10 ENCOUNTER — Ambulatory Visit (INDEPENDENT_AMBULATORY_CARE_PROVIDER_SITE_OTHER): Payer: PPO | Admitting: Internal Medicine

## 2022-08-10 ENCOUNTER — Encounter: Payer: Self-pay | Admitting: Internal Medicine

## 2022-08-10 VITALS — BP 118/68 | HR 75 | Temp 98.0°F | Ht 67.0 in | Wt 147.0 lb

## 2022-08-10 DIAGNOSIS — H353230 Exudative age-related macular degeneration, bilateral, stage unspecified: Secondary | ICD-10-CM

## 2022-08-10 DIAGNOSIS — N1831 Chronic kidney disease, stage 3a: Secondary | ICD-10-CM | POA: Diagnosis not present

## 2022-08-10 DIAGNOSIS — A0471 Enterocolitis due to Clostridium difficile, recurrent: Secondary | ICD-10-CM

## 2022-08-10 DIAGNOSIS — I5042 Chronic combined systolic (congestive) and diastolic (congestive) heart failure: Secondary | ICD-10-CM | POA: Diagnosis not present

## 2022-08-10 DIAGNOSIS — K819 Cholecystitis, unspecified: Secondary | ICD-10-CM | POA: Diagnosis not present

## 2022-08-10 LAB — RENAL FUNCTION PANEL
Albumin: 3.9 g/dL (ref 3.5–5.2)
BUN: 11 mg/dL (ref 6–23)
CO2: 28 mEq/L (ref 19–32)
Calcium: 9.4 mg/dL (ref 8.4–10.5)
Chloride: 105 mEq/L (ref 96–112)
Creatinine, Ser: 1.04 mg/dL (ref 0.40–1.50)
GFR: 63.71 mL/min (ref >60.00)
Glucose, Bld: 94 mg/dL (ref 70–99)
Phosphorus: 2.6 mg/dL (ref 2.3–4.6)
Potassium: 4.1 mEq/L (ref 3.5–5.1)
Sodium: 140 mEq/L (ref 135–145)

## 2022-08-10 LAB — HEPATIC FUNCTION PANEL
ALT: 16 U/L (ref 0–53)
AST: 24 U/L (ref 0–37)
Albumin: 3.9 g/dL (ref 3.5–5.2)
Alkaline Phosphatase: 52 U/L (ref 39–117)
Bilirubin, Direct: 0.1 mg/dL (ref 0.0–0.3)
Total Bilirubin: 0.4 mg/dL (ref 0.2–1.2)
Total Protein: 6.6 g/dL (ref 6.0–8.3)

## 2022-08-10 LAB — CBC
HCT: 34.5 % — ABNORMAL LOW (ref 39.0–52.0)
Hemoglobin: 10.9 g/dL — ABNORMAL LOW (ref 13.0–17.0)
MCHC: 31.6 g/dL (ref 30.0–36.0)
MCV: 83.4 fl (ref 78.0–100.0)
Platelets: 322 10*3/uL (ref 150.0–400.0)
RBC: 4.14 Mil/uL — ABNORMAL LOW (ref 4.22–5.81)
RDW: 19.3 % — ABNORMAL HIGH (ref 11.5–15.5)
WBC: 7.8 10*3/uL (ref 4.0–10.5)

## 2022-08-10 NOTE — Progress Notes (Signed)
Subjective:    Patient ID: Leon Estes, male    DOB: Jan 22, 1931, 88 y.o.   MRN: [id number]  HPI Here with wife for hospital follow up  Developed bad RUQ pain Wife wanted to hold off on hospital but it got really bad MRCP did show small gallbladder with stones and apparent cholecystitis Now finished with antibiotics  Pain is gone now Did have sharp chest pain yesterday---just a few seconds Eating okay No N/V  Breathing is okay---stable DOE (but doesn't really push it) No palpitations No edema Spell of dizziness getting in shower---brief. No syncope  Current Outpatient Medications on File Prior to Visit  Medication Sig Dispense Refill   acetaminophen (TYLENOL) 500 MG tablet Take 1,000 mg by mouth every 6 (six) hours as needed for moderate pain or headache.     aspirin 81 MG EC tablet Take 1 tablet (81 mg total) by mouth daily. Swallow whole. 30 tablet 0   budesonide-formoterol (SYMBICORT) 160-4.5 MCG/ACT inhaler Inhale 2 puffs into the lungs 2 (two) times daily. 10.2 g 5   carvedilol (COREG) 3.125 MG tablet Take 1 tablet (3.125 mg total) by mouth 2 (two) times daily with a meal. 180 tablet 3   furosemide (LASIX) 20 MG tablet Take 1 tablet (20 mg total) by mouth daily. 90 tablet 3   gabapentin (NEURONTIN) 400 MG capsule TAKE 1 CAPSULE BY MOUTH EVERY NIGHT AT BEDTIME (Patient taking differently: Take 400 mg by mouth at bedtime.) 90 capsule 3   losartan (COZAAR) 25 MG tablet Take 0.5 tablets (12.5 mg total) by mouth daily. 45 tablet 3   Multiple Vitamin (MULTIVITAMIN WITH MINERALS) TABS tablet Take 1 tablet by mouth daily.     omeprazole (PRILOSEC) 20 MG capsule Take 1 capsule (20 mg total) by mouth daily. 30 capsule 3   potassium chloride (KLOR-CON) 10 MEQ tablet Take 1 tablet (10 mEq total) by mouth daily. 90 tablet 3   QUEtiapine (SEROQUEL) 25 MG tablet Take 1 tablet (25 mg total) by mouth at bedtime. 30 tablet 11   simvastatin (ZOCOR) 40 MG tablet Take 1 tablet (40 mg total)  by mouth at bedtime. 90 tablet 3   tamsulosin (FLOMAX) 0.4 MG CAPS capsule TAKE 1 CAPSULE BY MOUTH ONCE DAILY (Patient taking differently: 0.4 mg daily after supper.) 90 capsule 3   traMADol (ULTRAM) 50 MG tablet TAKE 1 TABLET BY MOUTH BY MOUTH DAILY ASNEEDED FOR PAIN (Patient taking differently: Take 50 mg by mouth every 6 (six) hours as needed for moderate pain.) 30 tablet 0   triamcinolone cream (KENALOG) 0.1 % Apply 1 Application topically 2 (two) times daily as needed. (Patient taking differently: Apply 1 Application topically 2 (two) times daily as needed (For rash).) 453.6 g 1   No current facility-administered medications on file prior to visit.    Allergies  Allergen Reactions   Augmentin [Amoxicillin-Pot Clavulanate] Rash   Bacitracin-Polymyxin B Swelling and Rash   Benzalkonium Chloride     "Benzalkonium chloride is a quaternary ammonium antiseptic and disinfectant with actions and uses similar to those of other cationic surfactants. It is also used as an antimicrobial preservative for pharmaceutical products."   Neosporin [Neomycin-Bacitracin Zn-Polymyx] Rash   Sulfamethoxazole-Trimethoprim Rash   Terbinafine And Related Rash    Past Medical History:  Diagnosis Date   Asthma    BPH (benign prostatic hypertrophy)    Cataract    Chronic renal disease, stage III (HCC)    Chronic sinusitis    COPD (chronic obstructive pulmonary  disease) ([place])    Coronary artery disease    2 stents   Gall stones    Hearing loss    DOES NOT WEAR HEARING AIDS   High cholesterol    Hypertension    Lactic acid acidosis 10/12/2021   Macular degeneration    right eye   Rash 12/04/2021   Septic shock ([place]) 10/10/2021    Past Surgical History:  Procedure Laterality Date   APPENDECTOMY     CORONARY ANGIOPLASTY WITH STENT PLACEMENT  1998   2 stents   TRANSURETHRAL RESECTION OF PROSTATE      Family History  Problem Relation Age of Onset   Diabetes Father    Heart disease Brother      Social History   Socioeconomic History   Marital status: Married    Spouse name: Not on file   Number of children: 4   Years of education: Not on file   Highest education level: Not on file  Occupational History   Occupation: Drove truck and concrete work  Tobacco Use   Smoking status: Former    Types: Cigarettes    Quit date: 08/13/2005    Years since quitting: 17.0    Passive exposure: Past   Smokeless tobacco: Current    Types: Chew   Tobacco comments:    discussed stopping this (only once in a while)  Vaping Use   Vaping Use: Never used  Substance and Sexual Activity   Alcohol use: No   Drug use: No   Sexual activity: Not on file  Other Topics Concern   Not on file  Social History Narrative   No living will   Wife, then [place] daughter should make decisions   Would accept resuscitation   Would probably accept tube feeds   Social Determinants of Health   Financial Resource Strain: Not on file  Food Insecurity: No Food Insecurity (08/07/2022)   Hunger Vital Sign    Worried About Running Out of Food in the Last Year: Never true    Ran Out of Food in the Last Year: Never true  Transportation Needs: No Transportation Needs (08/07/2022)   PRAPARE - Hydrologist (Medical): No    Lack of Transportation (Non-Medical): No  Physical Activity: Not on file  Stress: Not on file  Social Connections: Not on file  Intimate Partner Violence: Not on file   Review of Systems Weight is actually up some Not sleeping great--per wife. Mostly related to neuropathy symptoms in feet Bowels moving fine Non longer getting eye injections--back to just [facility] (hasn't seen Zigmund Daniel in a while)    Objective:   Physical Exam Constitutional:      Appearance: Normal appearance.  Cardiovascular:     Rate and Rhythm: Normal rate and regular rhythm.     Heart sounds: No murmur heard.    No gallop.  Pulmonary:     Effort: Pulmonary effort is normal.      Breath sounds: No wheezing or rales.     Comments: Decreased breath sounds but clear Abdominal:     General: Bowel sounds are normal.     Palpations: Abdomen is soft.     Tenderness: There is no abdominal tenderness. There is no guarding or rebound.  Musculoskeletal:     Cervical back: Neck supple.     Right lower leg: No edema.     Left lower leg: No edema.  Lymphadenopathy:     Cervical: No cervical adenopathy.  Neurological:     Mental Status: He is alert.            Assessment & Plan:

## 2022-08-10 NOTE — Assessment & Plan Note (Signed)
No recurrent symptoms despite the recent antibiotics This is a good reason to get the gallbladder out to prevent the need for more antibiotics

## 2022-08-10 NOTE — Addendum Note (Signed)
Addended by: Viviana Simpler I on: 08/10/2022 10:03 AM   Modules accepted: Orders

## 2022-08-10 NOTE — Assessment & Plan Note (Signed)
Recurrent spells now Off antibiotics and symptoms are better Given the repeat attacks---I now recommend elective cholecystectomy despite his other morbidities Will contact Dr Dema Severin

## 2022-08-10 NOTE — Assessment & Plan Note (Signed)
EF is now close to--or normal Mostly just diastolic No fluid overload on furosemide 20 Carvedilol 3.125 bid, losartan 12.5 daily

## 2022-08-10 NOTE — Assessment & Plan Note (Signed)
Done with injections Vision is poor though

## 2022-08-10 NOTE — Assessment & Plan Note (Signed)
Intermittent elevated creatine--mostly with illnesses Will recheck today

## 2022-08-14 ENCOUNTER — Telehealth: Payer: Self-pay | Admitting: Internal Medicine

## 2022-08-14 NOTE — Telephone Encounter (Signed)
Gerald Stabs from [facility] called stating the pt's insurance isn't supporting budesonide-formoterol (SYMBICORT) 160-4.5 MCG/ACT inhaler. Gerald Stabs stated the pt's insurance labeled the Symbicort brand as a non formulary for the year, 2024. Gerald Stabs states the preferred brand is "breo ellipta" & there are 2 different strengths:100/25 micrograms &  200/25 micrograms. Gerald Stabs states the 200 strength is what they think is best for pt. Gerald Stabs stated if the insurance would somehow & someway support Symbicort for the pt, the co pay would be over $100 for the pt & the "breo ellipta" co pay would be just $40, so its cheaper as well. Call back # [phone]

## 2022-08-15 MED ORDER — FLUTICASONE FUROATE-VILANTEROL 200-25 MCG/ACT IN AEPB: 1.0000 | INHALATION_SPRAY | Freq: Every day | RESPIRATORY_TRACT | 11 refills | Status: DC

## 2022-08-15 NOTE — Telephone Encounter (Signed)
Removed Symbicort from his list and added Breo and sent to pharmacy.

## 2022-08-17 DIAGNOSIS — M2042 Other hammer toe(s) (acquired), left foot: Secondary | ICD-10-CM | POA: Diagnosis not present

## 2022-08-17 DIAGNOSIS — G5793 Unspecified mononeuropathy of bilateral lower limbs: Secondary | ICD-10-CM | POA: Diagnosis not present

## 2022-08-17 DIAGNOSIS — M79674 Pain in right toe(s): Secondary | ICD-10-CM | POA: Diagnosis not present

## 2022-08-17 DIAGNOSIS — M2011 Hallux valgus (acquired), right foot: Secondary | ICD-10-CM | POA: Diagnosis not present

## 2022-08-17 DIAGNOSIS — M2041 Other hammer toe(s) (acquired), right foot: Secondary | ICD-10-CM | POA: Diagnosis not present

## 2022-08-17 DIAGNOSIS — B351 Tinea unguium: Secondary | ICD-10-CM | POA: Diagnosis not present

## 2022-08-17 DIAGNOSIS — M2012 Hallux valgus (acquired), left foot: Secondary | ICD-10-CM | POA: Diagnosis not present

## 2022-08-17 DIAGNOSIS — L600 Ingrowing nail: Secondary | ICD-10-CM | POA: Diagnosis not present

## 2022-08-17 DIAGNOSIS — M79675 Pain in left toe(s): Secondary | ICD-10-CM | POA: Diagnosis not present

## 2022-08-20 ENCOUNTER — Other Ambulatory Visit: Payer: Self-pay | Admitting: Internal Medicine

## 2022-08-20 NOTE — Telephone Encounter (Signed)
Last filled 08-01-22 #30

## 2022-08-23 ENCOUNTER — Telehealth: Payer: Self-pay | Admitting: Internal Medicine

## 2022-08-23 DIAGNOSIS — J449 Chronic obstructive pulmonary disease, unspecified: Secondary | ICD-10-CM

## 2022-08-23 NOTE — Telephone Encounter (Signed)
Patient wife [name] called in returning a call she received.

## 2022-08-23 NOTE — Telephone Encounter (Addendum)
Spoke to pt's wife. She said she does not think anything is coming out of the [facility] inhaler. I suggested that she take it to [facility] and ask them how it works.  Sent [employer] to [facility].

## 2022-08-23 NOTE — Telephone Encounter (Signed)
Landon from [facility] called stating the pt was recently on "Symbicort" & switched to "fluticasone furoate-vilanterol (BREO ELLIPTA) 200-25 MCG/ACT AEPB" due to insurance not covering the Symbicort anymore. Harmon Pier stated the pt's wife doesn't think the new meds are working for pt like the Symbicort was. Harmon Pier stated the insurance does cover the meds, "Advair" & is asking for [facility] to send in a prescription for the meds? Call back # [phone]

## 2022-08-24 MED ORDER — IPRATROPIUM-ALBUTEROL 0.5-2.5 (3) MG/3ML IN SOLN: RESPIRATORY_TRACT | 11 refills | Status: DC

## 2022-08-24 MED ORDER — BUDESONIDE 0.5 MG/2ML IN SUSP: 0.5000 mg | Freq: Two times a day (BID) | RESPIRATORY_TRACT | 12 refills | Status: DC

## 2022-08-24 NOTE — Addendum Note (Signed)
Addended by: Pilar Grammes on: 08/24/2022 04:20 PM   Modules accepted: Orders

## 2022-08-24 NOTE — Telephone Encounter (Signed)
Leon Estes from [name] patient is not able to take the brio properly. Thinks it is due to the fact that it is not propellant based. Wanted to know if we could change to something that will be covered. Anything that he is aware of is not propellant based. Advised will send to Dr. Silvio Pate for review and reach out to patient.

## 2022-08-30 ENCOUNTER — Other Ambulatory Visit: Payer: Self-pay | Admitting: Internal Medicine

## 2022-09-03 ENCOUNTER — Telehealth: Payer: Self-pay | Admitting: Internal Medicine

## 2022-09-03 NOTE — Telephone Encounter (Signed)
Leon Estes the case manager called from [facility] and stated patient need the nebulizer machine. Call back number [phone].

## 2022-09-03 NOTE — Telephone Encounter (Signed)
I sent a message to [facility] to find out where they are in the process of getting him a nebulizer.

## 2022-09-04 NOTE — Telephone Encounter (Addendum)
Patient wife called and stated can the nebulizer be sent to somewhere in [place]. Call back number [phone]

## 2022-09-04 NOTE — Telephone Encounter (Signed)
I spoke to pt's wife. She said she has not received a call for the machine even though [employer] is saying they have tried calling her. I have sent the message back to [employer] asking for a number I can give them to call [employer] back.

## 2022-09-05 ENCOUNTER — Telehealth: Payer: Self-pay | Admitting: *Deleted

## 2022-09-05 DIAGNOSIS — K8010 Calculus of gallbladder with chronic cholecystitis without obstruction: Secondary | ICD-10-CM | POA: Diagnosis not present

## 2022-09-05 DIAGNOSIS — I2510 Atherosclerotic heart disease of native coronary artery without angina pectoris: Secondary | ICD-10-CM | POA: Diagnosis not present

## 2022-09-05 DIAGNOSIS — H9192 Unspecified hearing loss, left ear: Secondary | ICD-10-CM | POA: Diagnosis not present

## 2022-09-05 DIAGNOSIS — J449 Chronic obstructive pulmonary disease, unspecified: Secondary | ICD-10-CM | POA: Diagnosis not present

## 2022-09-05 DIAGNOSIS — I5042 Chronic combined systolic (congestive) and diastolic (congestive) heart failure: Secondary | ICD-10-CM | POA: Diagnosis not present

## 2022-09-05 DIAGNOSIS — I480 Paroxysmal atrial fibrillation: Secondary | ICD-10-CM | POA: Diagnosis not present

## 2022-09-05 DIAGNOSIS — N1831 Chronic kidney disease, stage 3a: Secondary | ICD-10-CM | POA: Diagnosis not present

## 2022-09-05 DIAGNOSIS — I10 Essential (primary) hypertension: Secondary | ICD-10-CM | POA: Diagnosis not present

## 2022-09-05 NOTE — Telephone Encounter (Signed)
   Name: Leon Estes  DOB: 12/23/1930  MRN: [id number]  Primary Cardiologist: Mertie Moores, MD   Preoperative team, please contact this patient and set up a phone call appointment for further preoperative risk assessment. Please obtain consent and complete medication review. Thank you for your help.  I confirm that guidance regarding antiplatelet and oral anticoagulation therapy has been completed and, if necessary, noted below.  Per office protocol, if patient is without any new symptoms or concerns at the time of their virtual visit, he may hold Aspirin for 5-7 days prior to procedure. Please resume Aspirin as soon as possible postprocedure, at the discretion of the surgeon.     Lenna Sciara, NP 09/05/2022, 4:41 PM [place]

## 2022-09-05 NOTE — Telephone Encounter (Signed)
   Pre-operative Risk Assessment    Patient Name: Leon Estes  DOB: 30-Aug-1930 MRN: [id number]      Request for Surgical Clearance    Procedure:   LAP CHOLE  Date of Surgery:  Clearance TBD                                 Surgeon:  DR. Greer Pickerel Surgeon's Group or Practice Name:  [facility] Phone number:  [phone] Fax number:  [phone] ATTN: Merideth Abbey, CMA   Type of Clearance Requested:   - Medical ; ASA    Type of Anesthesia:  General    Additional requests/questions:    [name]   09/05/2022, 4:30 PM

## 2022-09-06 NOTE — Telephone Encounter (Signed)
Mail box full . Will try later .

## 2022-09-07 ENCOUNTER — Telehealth: Payer: Self-pay | Admitting: *Deleted

## 2022-09-07 NOTE — Telephone Encounter (Signed)
Patient's wife is returning call. 

## 2022-09-07 NOTE — Telephone Encounter (Signed)
S/w the pt and his wife and they are agreeable to plan of care for tele visit 09/11/22 2 pm. Med rec and consent are done.

## 2022-09-07 NOTE — Telephone Encounter (Signed)
S/w the pt and his wife and they are agreeable to plan of care for tele visit 09/11/22 2 pm. Med rec and consent are done.     Patient Consent for Virtual Visit        Leon Estes has provided verbal consent on 09/07/2022 for a virtual visit (video or telephone).   CONSENT FOR VIRTUAL VISIT FOR:  Leon Estes  By participating in this virtual visit I agree to the following:  I hereby voluntarily request, consent and authorize [facility] and its employed or contracted physicians, physician assistants, nurse practitioners or other licensed health care professionals (the Practitioner), to provide me with telemedicine health care services (the "Services") as deemed necessary by the treating Practitioner. I acknowledge and consent to receive the Services by the Practitioner via telemedicine. I understand that the telemedicine visit will involve communicating with the Practitioner through live audiovisual communication technology and the disclosure of certain medical information by electronic transmission. I acknowledge that I have been given the opportunity to request an in-person assessment or other available alternative prior to the telemedicine visit and am voluntarily participating in the telemedicine visit.  I understand that I have the right to withhold or withdraw my consent to the use of telemedicine in the course of my care at any time, without affecting my right to future care or treatment, and that the Practitioner or I may terminate the telemedicine visit at any time. I understand that I have the right to inspect all information obtained and/or recorded in the course of the telemedicine visit and may receive copies of available information for a reasonable fee.  I understand that some of the potential risks of receiving the Services via telemedicine include:  Delay or interruption in medical evaluation due to technological equipment failure or disruption; Information  transmitted may not be sufficient (e.g. poor resolution of images) to allow for appropriate medical decision making by the Practitioner; and/or  In rare instances, security protocols could fail, causing a breach of personal health information.  Furthermore, I acknowledge that it is my responsibility to provide information about my medical history, conditions and care that is complete and accurate to the best of my ability. I acknowledge that Practitioner's advice, recommendations, and/or decision may be based on factors not within their control, such as incomplete or inaccurate data provided by me or distortions of diagnostic images or specimens that may result from electronic transmissions. I understand that the practice of medicine is not an exact science and that Practitioner makes no warranties or guarantees regarding treatment outcomes. I acknowledge that a copy of this consent can be made available to me via my patient portal ([facility]), or I can request a printed copy by calling the office of [facility].    I understand that my insurance will be billed for this visit.   I have read or had this consent read to me. I understand the contents of this consent, which adequately explains the benefits and risks of the Services being provided via telemedicine.  I have been provided ample opportunity to ask questions regarding this consent and the Services and have had my questions answered to my satisfaction. I give my informed consent for the services to be provided through the use of telemedicine in my medical care

## 2022-09-10 NOTE — Progress Notes (Unsigned)
Virtual Visit via Telephone Note   Because of Leon Estes's co-morbid illnesses, he is at least at moderate risk for complications without adequate follow up.  This format is felt to be most appropriate for this patient at this time.  The patient did not have access to video technology/had technical difficulties with video requiring transitioning to audio format only (telephone).  All issues noted in this document were discussed and addressed.  No physical exam could be performed with this format.  Please refer to the patient's chart for his consent to telehealth for [facility].  Evaluation Performed:  Preoperative cardiovascular risk assessment _____________   Date:  09/10/2022   Patient ID:  Leon Estes, DOB [birth year], MRN [id number] Patient Location:  Home Provider location:   Office  Primary Care Provider:  Venia Carbon, MD Primary Cardiologist:  Mertie Moores, MD  Chief Complaint / Patient Profile   89 y.o. y/o male with a h/o CAD s/p MI with stenting x 2 of RCA 1997, chronic combined CHF, COPD, HLD who is pending laparoscopic cholecystectomy and presents today for telephonic preoperative cardiovascular risk assessment.  History of Present Illness    Leon Estes is a 89 y.o. male who presents via audio/video conferencing for a telehealth visit today.  Pt was last seen in cardiology clinic on 01/09/22 by Dr. Acie Fredrickson.  At that time Leon Estes was doing well.  The patient is now pending procedure as outlined above. Since his last visit, he *** denies chest pain, shortness of breath, lower extremity edema, fatigue, palpitations, melena, hematuria, hemoptysis, diaphoresis, weakness, presyncope, syncope, orthopnea, and PND.   Past Medical History    Past Medical History:  Diagnosis Date   Asthma    BPH (benign prostatic hypertrophy)    Cataract    Chronic renal disease, stage III (HCC)    Chronic sinusitis    COPD (chronic obstructive pulmonary  disease) (HCC)    Coronary artery disease    2 stents   Gall stones    Hearing loss    DOES NOT WEAR HEARING AIDS   High cholesterol    Hypertension    Lactic acid acidosis 10/12/2021   Macular degeneration    right eye   Rash 12/04/2021   Septic shock ([place]) 10/10/2021   Past Surgical History:  Procedure Laterality Date   APPENDECTOMY     CORONARY ANGIOPLASTY WITH STENT PLACEMENT  1998   2 stents   TRANSURETHRAL RESECTION OF PROSTATE      Allergies  Allergies  Allergen Reactions   Augmentin [Amoxicillin-Pot Clavulanate] Rash   Bacitracin-Polymyxin B Swelling and Rash   Benzalkonium Chloride     "Benzalkonium chloride is a quaternary ammonium antiseptic and disinfectant with actions and uses similar to those of other cationic surfactants. It is also used as an antimicrobial preservative for pharmaceutical products."   Neosporin [Neomycin-Bacitracin Zn-Polymyx] Rash   Sulfamethoxazole-Trimethoprim Rash   Terbinafine And Related Rash    Home Medications    Prior to Admission medications   Medication Sig Start Date End Date Taking? Authorizing Provider  acetaminophen (TYLENOL) 500 MG tablet Take 1,000 mg by mouth every 6 (six) hours as needed for moderate pain or headache.    [provider]  aspirin 81 MG EC tablet Take 1 tablet (81 mg total) by mouth daily. Swallow whole. 10/18/21   Darliss Cheney, MD  budesonide (PULMICORT) 0.5 MG/2ML nebulizer solution Take 2 mLs (0.5 mg total) by nebulization in the morning and  at bedtime. 08/24/22   Venia Carbon, MD  carvedilol (COREG) 3.125 MG tablet Take 1 tablet (3.125 mg total) by mouth 2 (two) times daily with a meal. 11/07/21   Imogene Burn, PA-C  fluticasone furoate-vilanterol (BREO ELLIPTA) 200-25 MCG/ACT AEPB Inhale 1 puff into the lungs daily. 08/15/22   Venia Carbon, MD  furosemide (LASIX) 20 MG tablet Take 1 tablet (20 mg total) by mouth daily. 11/07/21   Imogene Burn, PA-C  gabapentin (NEURONTIN) 400 MG  capsule TAKE 1 CAPSULE BY MOUTH EVERY NIGHT AT BEDTIME Patient taking differently: Take 400 mg by mouth at bedtime. 11/28/21   Venia Carbon, MD  ipratropium-albuterol (DUONEB) 0.5-2.5 (3) MG/3ML SOLN Take 3 ml by nebulization 3-4 times daily as needed 08/24/22   Venia Carbon, MD  losartan (COZAAR) 25 MG tablet Take 0.5 tablets (12.5 mg total) by mouth daily. 11/07/21   Imogene Burn, PA-C  Multiple Vitamin (MULTIVITAMIN WITH MINERALS) TABS tablet Take 1 tablet by mouth daily. 01/23/21   Georgette Shell, MD  omeprazole (PRILOSEC) 20 MG capsule Take 1 capsule (20 mg total) by mouth daily. 10/20/21   Kennyth Arnold, FNP  potassium chloride (KLOR-CON) 10 MEQ tablet Take 1 tablet (10 mEq total) by mouth daily. 11/07/21   Imogene Burn, PA-C  QUEtiapine (SEROQUEL) 25 MG tablet TAKE 1 TABLET BY MOUTH EVERY NIGHT AT BEDTIME 08/30/22   Venia Carbon, MD  simvastatin (ZOCOR) 40 MG tablet Take 1 tablet (40 mg total) by mouth at bedtime. 10/23/21   Venia Carbon, MD  tamsulosin (FLOMAX) 0.4 MG CAPS capsule Take 1 capsule (0.4 mg total) by mouth daily after supper. 08/14/22   Venia Carbon, MD  traMADol (ULTRAM) 50 MG tablet Take 1 tablet (50 mg total) by mouth every 6 (six) hours as needed for moderate pain. 08/20/22   Venia Carbon, MD  triamcinolone cream (KENALOG) 0.1 % Apply 1 Application topically 2 (two) times daily as needed. Patient taking differently: Apply 1 Application topically 2 (two) times daily as needed (For rash). 04/02/22   Owens Loffler, MD    Physical Exam    Vital Signs:  Leon Estes does not have vital signs available for review today.***  Given telephonic nature of communication, physical exam is limited. AAOx3. NAD. Normal affect.  Speech and respirations are unlabored.  Accessory Clinical Findings    None  Assessment & Plan    1.  Preoperative Cardiovascular Risk Assessment:  The patient was advised that if he develops new symptoms prior to  surgery to contact our office to arrange for a follow-up visit, and he verbalized understanding.  Per office protocol, if patient is without any new symptoms or concerns at the time of their virtual visit, he may hold Aspirin for 5-7 days prior to procedure. Please resume Aspirin as soon as possible postprocedure, at the discretion of the surgeon   A copy of this note will be routed to requesting surgeon.  Time:   Today, I have spent *** minutes with the patient with telehealth technology discussing medical history, symptoms, and management plan.     Demiyah Fischbach, Lanice Schwab, NP  09/10/2022, 1:21 PM

## 2022-09-11 ENCOUNTER — Ambulatory Visit: Payer: PPO | Admitting: Nurse Practitioner

## 2022-09-11 DIAGNOSIS — Z01810 Encounter for preprocedural cardiovascular examination: Secondary | ICD-10-CM

## 2022-09-19 ENCOUNTER — Ambulatory Visit: Payer: PPO | Attending: Cardiovascular Disease | Admitting: Cardiovascular Disease

## 2022-09-19 ENCOUNTER — Encounter: Payer: Self-pay | Admitting: Cardiovascular Disease

## 2022-09-19 VITALS — BP 134/62 | HR 74 | Ht 67.0 in | Wt 143.8 lb

## 2022-09-19 DIAGNOSIS — Z01810 Encounter for preprocedural cardiovascular examination: Secondary | ICD-10-CM

## 2022-09-19 DIAGNOSIS — I5022 Chronic systolic (congestive) heart failure: Secondary | ICD-10-CM

## 2022-09-19 DIAGNOSIS — I2510 Atherosclerotic heart disease of native coronary artery without angina pectoris: Secondary | ICD-10-CM

## 2022-09-19 DIAGNOSIS — I10 Essential (primary) hypertension: Secondary | ICD-10-CM | POA: Diagnosis not present

## 2022-09-19 NOTE — Patient Instructions (Signed)
Medication Instructions:  Your physician recommends that you continue on your current medications as directed. Please refer to the Current Medication list given to you today.  *If you need a refill on your cardiac medications before your next appointment, please call your pharmacy*   Lab Work: NONE If you have labs (blood work) drawn today and your tests are completely normal, you will receive your results only by: MyChart Message (if you have MyChart) OR A paper copy in the mail If you have any lab test that is abnormal or we need to change your treatment, we will call you to review the results.   Testing/Procedures: NONE   Follow-Up: At [facility], you and your health needs are our priority.  As part of our continuing mission to provide you with exceptional heart care, we have created designated Provider Care Teams.  These Care Teams include your primary Cardiologist (physician) and Advanced Practice Providers (APPs -  Physician Assistants and Nurse Practitioners) who all work together to provide you with the care you need, when you need it.  We recommend signing up for the patient portal called "MyChart".  Sign up information is provided on this After Visit Summary.  MyChart is used to connect with patients for Virtual Visits (Telemedicine).  Patients are able to view lab/test results, encounter notes, upcoming appointments, etc.  Non-urgent messages can be sent to your provider as well.   To learn more about what you can do with MyChart, go to [email or web address].    Your next appointment:   6 month(s)  Provider:   Philip Nahser, MD      

## 2022-09-19 NOTE — Addendum Note (Signed)
Addended by: Thayer Headings on: 09/19/2022 12:20 PM   Modules accepted: Level of Service

## 2022-09-19 NOTE — Telephone Encounter (Signed)
Hx of CAD - is not having current episodes of angina with daily chores  Hx of CHF  EF has improved from 30 % to 45-50% by echo last May, 2023.   He is at low-moderate risk for his upcoming cholecystectomy  He may hold his ASA for 5-7 days prior to the surgery . Restart following his procedure     Mertie Moores, MD  09/19/2022 12:19 PM    [address],  [place], [place]  [address] Phone: [phone]; Fax: [phone]

## 2022-09-19 NOTE — Progress Notes (Signed)
Cardiology Office Note   Date:  09/19/2022   ID:  Leon Estes, Leon Estes [birth year], MRN [id number]  PCP:  Venia Carbon, MD  Cardiologist:   Mertie Moores, MD / previously saw [name], MD at [facility]  No chief complaint on file.  Problem list: 1. Coronary artery disease-status post stenting of the right coronary artery in 1997 2. COPD 3. Hyperlipidemia     Leon Estes is a 89 y.o. male who presents for follow up of his CP He had am MI in 1997.  Had 2 stents placed by Dr. Glade Lloyd. Hs seen [facility] in the past, wants to see a cardiologist here in [place] .  Has done well,  No further of CP.  Exercises regularly,  Mows 3 acres.   Goes bowling once a week.  Also has some pain down his leg.  Able to weed-eat without any problems  Sees Dr. Delfina Redwood. Was recently found to have mild anemia.  Was started on iron at that time .   January 13, 2016:  Doing well.  Still very active.  Mows his 3 acres and weed-eats .   Bowls on wednesdays   Aug. 28, 2018: Doing well,  No CP or dyspnea.  Still bowling - now bowls around 180 .  Sees Dr. Delfina Redwood  - manages his lipids   April 15, 2019: Leon Estes is seen today for follow-up visit. Doing well.   Has not been bowling - closed for COVID No CP or dyspnea  Has some dyspnea due to his COPD .  - takes an inhaler regularly  Has had some leg pain .  Has resolved   April 26, 2020: Leon Estes is seen back today for follow-up of his coronary artery disease.  He has some shortness of breath due to his COPD. Has generalized fatiuged  No angina started on antibiotics.   April 29,2022; Leon Estes is seen today for follow up of his CAD Has chronic dyspnea due to his COPD  Was hospitalized with RUQ pain , WBC 16K Was started on antibiotics. No need for surgery at this point .   There was a comment about PAF. I cannot see any ECGs that show atrial fib  He has ectopic atrial rhythm , PVCs on tele during the hospitalization   September 12, 2021: Leon Estes is  seen today for a follow-up visit.  He has a history of coronary artery disease, COPD. No angina  No real exercise   His Hb is 8 in Dec. While in the hospital  Will DC ASA    Jan 09, 2022  Leon Estes is seen today for follow up of his CAD, COPD  Hx of anemia  No cp or dyspnea   Gets some exercise ,  limited in his walking  Echo from May 9 shows improved LV function with EF 45-50%  Grade I DD   ( previoius EF was 30%)  Severe left atrial enlargement.  Trivial aortic insufficiency.  Mild mitral regurgitation.  Feb. 7, 2024  Leon Estes is seen today for follow up of his CAD , COPD  Needs pre op clearence for a cholecystectomy  Echo in May, 2023 shows mildly depressed LV systolic function  This is much improved from his prior echo in Feb. 2023 when his EF was 30%.  Has some chest pain on occasion.   Wife is concerned about this  No CP with yard work .   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  No limitations with  these everyday activities.   He is at low - moderate risk for his cholecystectomy .     Past Medical History:  Diagnosis Date   Asthma    BPH (benign prostatic hypertrophy)    Cataract    Chronic renal disease, stage III (HCC)    Chronic sinusitis    COPD (chronic obstructive pulmonary disease) (HCC)    Coronary artery disease    2 stents   Gall stones    Hearing loss    DOES NOT WEAR HEARING AIDS   High cholesterol    Hypertension    Lactic acid acidosis 10/12/2021   Macular degeneration    right eye   Rash 12/04/2021   Septic shock ([place]) 10/10/2021    Past Surgical History:  Procedure Laterality Date   APPENDECTOMY     CORONARY ANGIOPLASTY WITH STENT PLACEMENT  1998   2 stents   TRANSURETHRAL RESECTION OF PROSTATE       Current Outpatient Medications  Medication Sig Dispense Refill   acetaminophen (TYLENOL) 500 MG tablet Take 1,000 mg by mouth every 6 (six) hours as needed for moderate pain or headache.     aspirin 81 MG EC tablet Take 1 tablet (81 mg  total) by mouth daily. Swallow whole. 30 tablet 0   budesonide (PULMICORT) 0.5 MG/2ML nebulizer solution Take 2 mLs (0.5 mg total) by nebulization in the morning and at bedtime. 200 mL 12   carvedilol (COREG) 3.125 MG tablet Take 1 tablet (3.125 mg total) by mouth 2 (two) times daily with a meal. 180 tablet 3   furosemide (LASIX) 20 MG tablet Take 1 tablet (20 mg total) by mouth daily. 90 tablet 3   gabapentin (NEURONTIN) 400 MG capsule TAKE 1 CAPSULE BY MOUTH EVERY NIGHT AT BEDTIME (Patient taking differently: Take 400 mg by mouth at bedtime.) 90 capsule 3   ipratropium-albuterol (DUONEB) 0.5-2.5 (3) MG/3ML SOLN Take 3 ml by nebulization 3-4 times daily as needed 360 mL 11   losartan (COZAAR) 25 MG tablet Take 0.5 tablets (12.5 mg total) by mouth daily. 45 tablet 3   Multiple Vitamin (MULTIVITAMIN WITH MINERALS) TABS tablet Take 1 tablet by mouth daily.     omeprazole (PRILOSEC) 20 MG capsule Take 1 capsule (20 mg total) by mouth daily. 30 capsule 3   potassium chloride (KLOR-CON) 10 MEQ tablet Take 1 tablet (10 mEq total) by mouth daily. 90 tablet 3   QUEtiapine (SEROQUEL) 25 MG tablet TAKE 1 TABLET BY MOUTH EVERY NIGHT AT BEDTIME 30 tablet 11   simvastatin (ZOCOR) 40 MG tablet Take 1 tablet (40 mg total) by mouth at bedtime. 90 tablet 3   tamsulosin (FLOMAX) 0.4 MG CAPS capsule Take 1 capsule (0.4 mg total) by mouth daily after supper. 100 capsule 3   traMADol (ULTRAM) 50 MG tablet Take 1 tablet (50 mg total) by mouth every 6 (six) hours as needed for moderate pain. 30 tablet 0   triamcinolone cream (KENALOG) 0.1 % Apply 1 Application topically 2 (two) times daily as needed. 453.6 g 1   fluticasone furoate-vilanterol (BREO ELLIPTA) 200-25 MCG/ACT AEPB Inhale 1 puff into the lungs daily. (Patient not taking: Reported on 09/19/2022) 1 each 11   No current facility-administered medications for this visit.    Allergies:   Augmentin [amoxicillin-pot clavulanate], Bacitracin-polymyxin b, Benzalkonium  chloride, Neosporin [neomycin-bacitracin zn-polymyx], Sulfamethoxazole-trimethoprim, and Terbinafine and related    Social History:  The patient  reports that he quit smoking about 17 years ago. His smoking use included  cigarettes. He has been exposed to tobacco smoke. His smokeless tobacco use includes chew. He reports that he does not drink alcohol and does not use drugs.   Family History:  The patient's family history includes Diabetes in his father; Heart disease in his brother.    ROS:  Please see the history of present illness.  Noted in HPI.  Otherwise review of systems is negative.  Physical Exam: Blood pressure 134/62, pulse 74, height '5\' 7"'$  (1.702 m), weight 143 lb 12.8 oz (65.2 kg), SpO2 96 %.    GEN:  elderly , frail man  in no acute distress, hard of hearing  HEENT: Normal NECK: No JVD; No carotid bruits LYMPHATICS: No lymphadenopathy CARDIAC: RRR , no murmurs, rubs, gallops RESPIRATORY:  Clear to auscultation without rales, wheezing or rhonchi  ABDOMEN: Soft, non-tender, non-distended MUSCULOSKELETAL:  No edema; No deformity  SKIN: Warm and dry NEUROLOGIC:  Alert and oriented x 3     EKG:    September 19, 2022: Normal sinus rhythm at 74.  First-degree AV block.  Right bundle branch block.  No acute changes.    Recent Labs: 08/04/2022: Magnesium 1.8 08/10/2022: ALT 16; BUN 11; Creatinine, Ser 1.04; Hemoglobin 10.9; Platelets 322.0; Potassium 4.1; Sodium 140    Lipid Panel    Component Value Date/Time   TRIG 107 01/09/2021 0400      Wt Readings from Last 3 Encounters:  09/19/22 143 lb 12.8 oz (65.2 kg)  08/10/22 147 lb (66.7 kg)  08/04/22 139 lb 8.8 oz (63.3 kg)      Other studies Reviewed: Additional studies/ records that were reviewed today include: . Review of the above records demonstrates:    ASSESSMENT AND PLAN:  1. Coronary artery disease- .  No angina .  Seems very stable.  No angina doing his chores.      2. COPD -  per primary MD and  pulmonary    3. Hyperlipidemia -       4.  Acute combined CHF :  EF has improved from 30 % to 45-50% by echo last May, 2023.  He is at low-moderate risk for his upcoming cholecystectomy  He may hold his ASA for 5-7 days prior to the surgery . Restart following his procedure     Current medicines are reviewed at length with the patient today.  The patient does not have concerns regarding medicines.  The following changes have been made:  no change  Labs/ tests ordered today include:   Orders Placed This Encounter  Procedures   EKG 12-Lead    Disposition:      Mertie Moores, MD  09/19/2022 12:15 PM    [facility] [address], [place], [place]  [address] Phone: [phone]; Fax: [phone]

## 2022-10-01 ENCOUNTER — Other Ambulatory Visit: Payer: Self-pay | Admitting: Internal Medicine

## 2022-10-02 NOTE — Telephone Encounter (Signed)
Mickel Baas from [facility] is calling to get update on clearance. Requesting update be sent to fax # provider.   Fax # [phone].

## 2022-10-02 NOTE — Telephone Encounter (Signed)
Refill request for TRAMADOL HCL 50 MG TAB   LOV - 08/10/22 Next OV - not scheduled Last refill - 08/20/22 #30/0

## 2022-10-02 NOTE — Telephone Encounter (Signed)
Refaxed over surgical clearance to [id number] attn:  Mickel Baas.

## 2022-10-23 ENCOUNTER — Other Ambulatory Visit: Payer: Self-pay | Admitting: Internal Medicine

## 2022-10-23 ENCOUNTER — Other Ambulatory Visit: Payer: Self-pay | Admitting: Physician Assistant

## 2022-10-31 DIAGNOSIS — G629 Polyneuropathy, unspecified: Secondary | ICD-10-CM | POA: Diagnosis not present

## 2022-10-31 DIAGNOSIS — H9193 Unspecified hearing loss, bilateral: Secondary | ICD-10-CM | POA: Diagnosis not present

## 2022-10-31 DIAGNOSIS — R7309 Other abnormal glucose: Secondary | ICD-10-CM | POA: Diagnosis not present

## 2022-10-31 DIAGNOSIS — H3530 Unspecified macular degeneration: Secondary | ICD-10-CM | POA: Diagnosis not present

## 2022-11-08 ENCOUNTER — Other Ambulatory Visit: Payer: Self-pay | Admitting: Internal Medicine

## 2022-11-20 ENCOUNTER — Other Ambulatory Visit: Payer: Self-pay | Admitting: Physician Assistant

## 2022-11-20 ENCOUNTER — Other Ambulatory Visit: Payer: Self-pay | Admitting: Internal Medicine

## 2022-12-12 ENCOUNTER — Other Ambulatory Visit: Payer: Self-pay | Admitting: Family Medicine

## 2022-12-19 ENCOUNTER — Other Ambulatory Visit: Payer: Self-pay | Admitting: Physician Assistant

## 2023-01-02 ENCOUNTER — Ambulatory Visit (INDEPENDENT_AMBULATORY_CARE_PROVIDER_SITE_OTHER): Payer: PPO | Admitting: Internal Medicine

## 2023-01-02 ENCOUNTER — Encounter: Payer: Self-pay | Admitting: Internal Medicine

## 2023-01-02 VITALS — BP 118/60 | HR 68 | Temp 98.3°F | Ht 67.0 in | Wt 141.0 lb

## 2023-01-02 DIAGNOSIS — G629 Polyneuropathy, unspecified: Secondary | ICD-10-CM | POA: Diagnosis not present

## 2023-01-02 DIAGNOSIS — M19041 Primary osteoarthritis, right hand: Secondary | ICD-10-CM | POA: Diagnosis not present

## 2023-01-02 DIAGNOSIS — M19042 Primary osteoarthritis, left hand: Secondary | ICD-10-CM | POA: Diagnosis not present

## 2023-01-02 NOTE — Assessment & Plan Note (Signed)
No apparent active synovitis Not really affecting his activities Already on tylenol--usually 1000mg  bid Discussed adding diclofenac topical

## 2023-01-02 NOTE — Assessment & Plan Note (Signed)
Mostly affecting his sleep Discussed trying the left over 300mg ---taking 2 (600mg ) If that doesn't help, and no side effects--will try 800mg 

## 2023-01-02 NOTE — Progress Notes (Signed)
Subjective:    Patient ID: Leon Estes, male    DOB: 02-06-1931, 89 y.o.   MRN: [id number]  HPI Here with wife due to hand pain  No apparent trouble with the gallbladder Didn't have the surgery  Having bad hand pain--if he makes fists No problems at night No swelling of note--wife thinks there might be some across the knuckles No pain with daily activities Just bad if he "folds them up real fast"  Ongoing neuropathy in feet Has seen neuro and other doctors--still keeps him up at times Just on the 400mg  of gabapentin   Current Outpatient Medications on File Prior to Visit  Medication Sig Dispense Refill   acetaminophen (TYLENOL) 500 MG tablet Take 1,000 mg by mouth every 6 (six) hours as needed for moderate pain or headache.     aspirin 81 MG EC tablet Take 1 tablet (81 mg total) by mouth daily. Swallow whole. 30 tablet 0   budesonide (PULMICORT) 0.5 MG/2ML nebulizer solution Take 2 mLs (0.5 mg total) by nebulization in the morning and at bedtime. 200 mL 12   carvedilol (COREG) 3.125 MG tablet TAKE ONE TABLET BY MOUTH TWICE A DAY WITH A MEAL 180 tablet 3   furosemide (LASIX) 20 MG tablet Take 1 tablet (20 mg total) by mouth daily. 90 tablet 3   gabapentin (NEURONTIN) 400 MG capsule Take 1 capsule (400 mg total) by mouth at bedtime. 90 capsule 3   ipratropium-albuterol (DUONEB) 0.5-2.5 (3) MG/3ML SOLN Take 3 ml by nebulization 3-4 times daily as needed 360 mL 11   losartan (COZAAR) 25 MG tablet TAKE ONE HALF (1/2) TABLET BY MOUTH DAILY 45 tablet 3   Multiple Vitamin (MULTIVITAMIN WITH MINERALS) TABS tablet Take 1 tablet by mouth daily.     omeprazole (PRILOSEC) 20 MG capsule Take 1 capsule (20 mg total) by mouth daily. 30 capsule 3   potassium chloride (KLOR-CON) 10 MEQ tablet TAKE ONE TABLET BY MOUTH ONCE A DAY 90 tablet 2   QUEtiapine (SEROQUEL) 25 MG tablet TAKE 1 TABLET BY MOUTH EVERY NIGHT AT BEDTIME 30 tablet 11   simvastatin (ZOCOR) 40 MG tablet TAKE ONE TABLET BY MOUTH  EVERY NIGHT AT BEDTIME 90 tablet 3   tamsulosin (FLOMAX) 0.4 MG CAPS capsule Take 1 capsule (0.4 mg total) by mouth daily after supper. 100 capsule 3   traMADol (ULTRAM) 50 MG tablet TAKE ONE TABLET BY MOUTH BY MOUTH DAILY AS NEEDED FOR PAIN 30 tablet 0   triamcinolone cream (KENALOG) 0.1 % APPLY ONE APPLICATION TOPICALLY TWO TIMES DAILY AS NEEDED 454 g 1   No current facility-administered medications on file prior to visit.    Allergies  Allergen Reactions   Augmentin [Amoxicillin-Pot Clavulanate] Rash   Bacitracin-Polymyxin B Swelling and Rash   Benzalkonium Chloride     "Benzalkonium chloride is a quaternary ammonium antiseptic and disinfectant with actions and uses similar to those of other cationic surfactants. It is also used as an antimicrobial preservative for pharmaceutical products."   Neosporin [Neomycin-Bacitracin Zn-Polymyx] Rash   Sulfamethoxazole-Trimethoprim Rash   Terbinafine And Related Rash    Past Medical History:  Diagnosis Date   Asthma    BPH (benign prostatic hypertrophy)    Cataract    Chronic renal disease, stage III (HCC)    Chronic sinusitis    COPD (chronic obstructive pulmonary disease) (HCC)    Coronary artery disease    2 stents   Gall stones    Hearing loss    DOES  NOT WEAR HEARING AIDS   High cholesterol    Hypertension    Lactic acid acidosis 10/12/2021   Macular degeneration    right eye   Rash 12/04/2021   Septic shock (HCC) 10/10/2021    Past Surgical History:  Procedure Laterality Date   APPENDECTOMY     CORONARY ANGIOPLASTY WITH STENT PLACEMENT  1998   2 stents   TRANSURETHRAL RESECTION OF PROSTATE      Family History  Problem Relation Age of Onset   Diabetes Father    Heart disease Brother     Social History   Socioeconomic History   Marital status: Married    Spouse name: Not on file   Number of children: 4   Years of education: Not on file   Highest education level: Not on file  Occupational History   Occupation:  Drove truck and concrete work  Tobacco Use   Smoking status: Former    Types: Cigarettes    Quit date: 08/13/2005    Years since quitting: 17.4    Passive exposure: Past   Smokeless tobacco: Current    Types: Chew   Tobacco comments:    discussed stopping this (only once in a while)  Vaping Use   Vaping Use: Never used  Substance and Sexual Activity   Alcohol use: No   Drug use: No   Sexual activity: Not on file  Other Topics Concern   Not on file  Social History Narrative   No living will   Wife, then LouAnn daughter should make decisions   Would accept resuscitation   Would probably accept tube feeds   Social Determinants of Health   Financial Resource Strain: Not on file  Food Insecurity: No Food Insecurity (08/07/2022)   Hunger Vital Sign    Worried About Running Out of Food in the Last Year: Never true    Ran Out of Food in the Last Year: Never true  Transportation Needs: No Transportation Needs (08/07/2022)   PRAPARE - Administrator, Civil Service (Medical): No    Lack of Transportation (Non-Medical): No  Physical Activity: Not on file  Stress: Not on file  Social Connections: Not on file  Intimate Partner Violence: Not on file   Review of Systems No fevers Eating okay    Objective:   Physical Exam Constitutional:      Appearance: Normal appearance.  Musculoskeletal:     Comments: Thickening in MCPs of hands especially and some in PIPs Wrists okay  Neurological:     Mental Status: He is alert.            Assessment & Plan:

## 2023-01-02 NOTE — Patient Instructions (Signed)
Try adding topical dilclofenac gel on your hands 2-3 times a day (to the tylenol)  Increase the bedtime gabapentin to 600mg  (2 of the leftover 300mg  capsules). If that helps, I will send a new prescription. If it doesn't, and doesn't give side effects, we can try 2 of the 400mg  capsulses (800mg  total)

## 2023-01-23 ENCOUNTER — Other Ambulatory Visit: Payer: Self-pay | Admitting: Internal Medicine

## 2023-01-23 NOTE — Telephone Encounter (Signed)
Last filled 11-09-22 #30 Last OV 01-02-23 Next OV 04-09-23 [facility]

## 2023-01-29 ENCOUNTER — Other Ambulatory Visit: Payer: Self-pay | Admitting: Internal Medicine

## 2023-02-16 ENCOUNTER — Encounter: Payer: Self-pay | Admitting: Radiology

## 2023-02-16 ENCOUNTER — Emergency Department: Payer: PPO

## 2023-02-16 ENCOUNTER — Emergency Department
Admission: EM | Admit: 2023-02-16 | Discharge: 2023-02-16 | Disposition: A | Discharge status: Home / Self Care | Payer: PPO | Attending: Emergency Medicine | Admitting: Emergency Medicine

## 2023-02-16 ENCOUNTER — Other Ambulatory Visit: Payer: Self-pay

## 2023-02-16 DIAGNOSIS — N189 Chronic kidney disease, unspecified: Secondary | ICD-10-CM | POA: Insufficient documentation

## 2023-02-16 DIAGNOSIS — J449 Chronic obstructive pulmonary disease, unspecified: Secondary | ICD-10-CM | POA: Insufficient documentation

## 2023-02-16 DIAGNOSIS — R112 Nausea with vomiting, unspecified: Secondary | ICD-10-CM | POA: Diagnosis not present

## 2023-02-16 DIAGNOSIS — I509 Heart failure, unspecified: Secondary | ICD-10-CM | POA: Insufficient documentation

## 2023-02-16 DIAGNOSIS — N400 Enlarged prostate without lower urinary tract symptoms: Secondary | ICD-10-CM | POA: Diagnosis not present

## 2023-02-16 DIAGNOSIS — R1013 Epigastric pain: Secondary | ICD-10-CM | POA: Insufficient documentation

## 2023-02-16 DIAGNOSIS — R079 Chest pain, unspecified: Secondary | ICD-10-CM | POA: Diagnosis not present

## 2023-02-16 DIAGNOSIS — J9811 Atelectasis: Secondary | ICD-10-CM | POA: Diagnosis not present

## 2023-02-16 DIAGNOSIS — R1011 Right upper quadrant pain: Secondary | ICD-10-CM | POA: Diagnosis not present

## 2023-02-16 DIAGNOSIS — R110 Nausea: Secondary | ICD-10-CM | POA: Insufficient documentation

## 2023-02-16 DIAGNOSIS — I2510 Atherosclerotic heart disease of native coronary artery without angina pectoris: Secondary | ICD-10-CM | POA: Diagnosis not present

## 2023-02-16 DIAGNOSIS — R109 Unspecified abdominal pain: Secondary | ICD-10-CM | POA: Diagnosis not present

## 2023-02-16 DIAGNOSIS — K5730 Diverticulosis of large intestine without perforation or abscess without bleeding: Secondary | ICD-10-CM | POA: Diagnosis not present

## 2023-02-16 DIAGNOSIS — K4090 Unilateral inguinal hernia, without obstruction or gangrene, not specified as recurrent: Secondary | ICD-10-CM | POA: Diagnosis not present

## 2023-02-16 DIAGNOSIS — K8020 Calculus of gallbladder without cholecystitis without obstruction: Secondary | ICD-10-CM | POA: Diagnosis not present

## 2023-02-16 LAB — TROPONIN I (HIGH SENSITIVITY)
Troponin I (High Sensitivity): 12 ng/L (ref <18)
Troponin I (High Sensitivity): 11 ng/L (ref <18)

## 2023-02-16 LAB — CBC
HCT: 37.1 % — ABNORMAL LOW (ref 39.0–52.0)
Hemoglobin: 11.3 g/dL — ABNORMAL LOW (ref 13.0–17.0)
MCH: 27 pg (ref 26.0–34.0)
MCHC: 30.5 g/dL (ref 30.0–36.0)
MCV: 88.5 fL (ref 80.0–100.0)
Platelets: 243 10*3/uL (ref 150–400)
RBC: 4.19 MIL/uL — ABNORMAL LOW (ref 4.22–5.81)
RDW: 15.4 % (ref 11.5–15.5)
WBC: 14.1 10*3/uL — ABNORMAL HIGH (ref 4.0–10.5)
nRBC: 0 % (ref 0.0–0.2)

## 2023-02-16 LAB — BASIC METABOLIC PANEL
Anion gap: 8 (ref 5–15)
BUN: 12 mg/dL (ref 8–23)
CO2: 24 mmol/L (ref 22–32)
Calcium: 9.2 mg/dL (ref 8.9–10.3)
Chloride: 104 mmol/L (ref 98–111)
Creatinine, Ser: 1.1 mg/dL (ref 0.61–1.24)
GFR, Estimated: >60 mL/min (ref >60)
Glucose, Bld: 124 mg/dL — ABNORMAL HIGH (ref 70–99)
Potassium: 3.8 mmol/L (ref 3.5–5.1)
Sodium: 136 mmol/L (ref 135–145)

## 2023-02-16 LAB — HEPATIC FUNCTION PANEL
ALT: 12 U/L (ref 0–44)
AST: 19 U/L (ref 15–41)
Albumin: 3.9 g/dL (ref 3.5–5.0)
Alkaline Phosphatase: 80 U/L (ref 38–126)
Bilirubin, Direct: 0.1 mg/dL (ref 0.0–0.2)
Indirect Bilirubin: 0.6 mg/dL (ref 0.3–0.9)
Total Bilirubin: 0.7 mg/dL (ref 0.3–1.2)
Total Protein: 7.5 g/dL (ref 6.5–8.1)

## 2023-02-16 LAB — LIPASE, BLOOD: Lipase: 28 U/L (ref 11–51)

## 2023-02-16 MED ORDER — MORPHINE SULFATE (PF) 2 MG/ML IV SOLN
2.0000 mg | Freq: Once | INTRAVENOUS | Status: AC
  Administered 2023-02-16: 2 mg via INTRAVENOUS
  Filled 2023-02-16: qty 1

## 2023-02-16 MED ORDER — LOSARTAN POTASSIUM 50 MG PO TABS: 25.0000 mg | ORAL_TABLET | ORAL | Status: DC

## 2023-02-16 MED ORDER — ONDANSETRON HCL 4 MG/2ML IJ SOLN
4.0000 mg | INTRAMUSCULAR | Status: AC
  Administered 2023-02-16: 4 mg via INTRAVENOUS
  Filled 2023-02-16: qty 2

## 2023-02-16 MED ORDER — DICYCLOMINE HCL 10 MG PO CAPS
10.0000 mg | ORAL_CAPSULE | Freq: Once | ORAL | Status: AC
  Administered 2023-02-16: 10 mg via ORAL
  Filled 2023-02-16: qty 1

## 2023-02-16 MED ORDER — IOHEXOL 300 MG/ML  SOLN
100.0000 mL | Freq: Once | INTRAMUSCULAR | Status: AC | PRN
  Administered 2023-02-16: 100 mL via INTRAVENOUS

## 2023-02-16 MED ORDER — DICYCLOMINE HCL 10 MG PO CAPS: 10.0000 mg | ORAL_CAPSULE | Freq: Three times a day (TID) | ORAL | 0 refills | Status: DC

## 2023-02-16 MED ORDER — CARVEDILOL 6.25 MG PO TABS
3.1250 mg | ORAL_TABLET | ORAL | Status: AC
  Administered 2023-02-16: 3.125 mg via ORAL
  Filled 2023-02-16: qty 1

## 2023-02-16 MED ORDER — LOSARTAN POTASSIUM 25 MG PO TABS
12.5000 mg | ORAL_TABLET | ORAL | Status: AC
  Administered 2023-02-16: 12.5 mg via ORAL
  Filled 2023-02-16: qty 0.5

## 2023-02-16 NOTE — ED Provider Notes (Signed)
[facility] Provider Note    Event Date/Time   First MD Initiated Contact with Patient 02/16/23 1400     (approximate)   History   Chest Pain   HPI  Leon Estes is a 89 y.o. male history of congestive heart failure, chronic kidney disease A-fib, coronary disease COPD cholelithiasis/cystitis  He and his family at the bedside reports that he was admitted to the hospital not too long ago where he was observed for abdominal pain and concerns of an infected gallbladder.  He was treated without surgery.  They were concerned about his operative risk.  He has been doing okay but this morning awoke with similar pain he reports pain in his mid to upper abdomen right upper abdomen and nausea.  No vomiting or chest pain.  He reports that he is not having chest pain but rather pain in his upper abdomen and it feels about the same as when he has had issues with his "gallbladder"  No noted fevers or chills.  He did not take his medication for morning and he did not feel well.    He asked about shortness of breath, both the patient and his family report that he has daily normal shortness of breath.  This is not usual   Physical Exam   Triage Vital Signs: ED Triage Vitals  Enc Vitals Group     BP 02/16/23 1206 (!) 177/74     Pulse Rate 02/16/23 1206 70     Resp 02/16/23 1206 18     Temp 02/16/23 1206 98.6 F (37 C)     Temp Source 02/16/23 1206 Oral     SpO2 02/16/23 1206 96 %     Weight 02/16/23 1205 141 lb (64 kg)     Height 02/16/23 1205 5\' 7"  (1.702 m)     Head Circumference --      Peak Flow --      Pain Score 02/16/23 1335 5     Pain Loc --      Pain Edu? --      Excl. in GC? --     Most recent vital signs: Vitals:   02/16/23 1556 02/16/23 1630  BP: (!) 199/80 (!) 171/91  Pulse: 78 80  Resp: 18 17  Temp: 98.1 F (36.7 C)   SpO2: 98% 96%     General: Awake, no distress.  CV:  Good peripheral perfusion.  Normal tones and  rate Resp:  Normal effort.  Clear bilateral Abd:  No distention.  Reports mild tenderness in epigastric and right upper quadrant.  There is no frank peritonitis or rebound or guarding.  No pain noted across the lower abdomen bilaterally. Mucous membranes warm well-perfused Other:     ED Results / Procedures / Treatments   Labs (all labs ordered are listed, but only abnormal results are displayed) Labs Reviewed  BASIC METABOLIC PANEL - Abnormal; Notable for the following components:      Result Value   Glucose, Bld 124 (*)    All other components within normal limits  CBC - Abnormal; Notable for the following components:   WBC 14.1 (*)    RBC 4.19 (*)    Hemoglobin 11.3 (*)    HCT 37.1 (*)    All other components within normal limits  HEPATIC FUNCTION PANEL  LIPASE, BLOOD  TROPONIN I (HIGH SENSITIVITY)  TROPONIN I (HIGH SENSITIVITY)     EKG  Interpreted by me as normal sinus rhythm with right  bundle branch block, heart rate 75 Interpreted by me at approximately 2 PM  No evidence of acute ischemic abnormality, mild baseline artifact   RADIOLOGY  Chest x-ray interpreted by me as negative for acute finding Labs interpreted as leukocytosis white count of 14,000 without associated elevated bilirubin or transaminitis.  Initial troponin is normal and given his pain has been ongoing since this morning very unlikely to represent ACS especially in his clinical context in setting    PROCEDURES:  Critical Care performed: No  Procedures   MEDICATIONS ORDERED IN ED: Medications  morphine (PF) 2 MG/ML injection 2 mg (2 mg Intravenous Given 02/16/23 1437)  ondansetron (ZOFRAN) injection 4 mg (4 mg Intravenous Given 02/16/23 1436)  carvedilol (COREG) tablet 3.125 mg (3.125 mg Oral Given 02/16/23 1437)  losartan (COZAAR) tablet 12.5 mg (12.5 mg Oral Given 02/16/23 1456)     IMPRESSION / MDM / ASSESSMENT AND PLAN / ED COURSE  I reviewed the triage vital signs and the nursing notes.                               Differential diagnosis includes, but is not limited to, recurrent cholelithiasis, cholecystitis, pancreatitis, choledocholithiasis, colitis, or other acute intra-abdominal pathology is strongly considered.  Hip at this point denies having chest pain rather provide more of a history of upper quadrant epigastric pain.  Given his previous clinical history and similar feelings with gallstone issue and noted leukocytosis we will evaluate with right upper quadrant ultrasound  Notes shortness of breath.  No pleuritic pain.  EKG without acute ischemic findings.  Initial troponin normal, extremely unlikely to represent ACS at this time.  No ripping tearing or moving pain that be strongly suggestive of dissection though he is hypertensive but he also reports he did not take his blood pressure medication.  Will provide home blood pressure medication continue to monitor.  Morphine for pain, antiemetic  Ongoing care assigned to Dr. Elige Radon or, follow-up on results of right upper quadrant ultrasound, if this does not show concerning pathology or causation would consider obtaining CT of the abdomen pelvis to further evaluate for other acute causing discomfort and disposition is somewhat unclear, but given leukocytosis, age morbidity and other cofactors anticipate he will likely be admitted  Patient's presentation is most consistent with acute complicated illness / injury requiring diagnostic workup.   The patient is on the cardiac monitor to evaluate for evidence of arrhythmia and/or significant heart rate changes.       FINAL CLINICAL IMPRESSION(S) / ED DIAGNOSES   Final diagnoses:  Epigastric abdominal pain  RUQ abdominal pain  Nausea     Rx / DC Orders   ED Discharge Orders     None        Note:  This document was prepared using Dragon voice recognition software and may include unintentional dictation errors.   Sharyn Creamer, MD 02/16/23 [phone]

## 2023-02-16 NOTE — ED Notes (Signed)
US tech at bedside

## 2023-02-16 NOTE — ED Provider Notes (Signed)
Emergency department handoff note  Care of this patient was signed out to me at the end of the previous provider shift.  All pertinent patient information was conveyed and all questions were answered.  Patient pending right upper quadrant ultrasound that showed mild gallbladder wall thickening.  Follow-up CT of the abdomen and pelvis shows likely chronic cholecystitis with cholelithiasis as well.  Patient's pain is relatively well-controlled at this time and requests discharge home to follow-up with general surgery as an outpatient.  I offered patient oral Bentyl before meals and at bedtime for continuing pain but did inform patient that he will need a cholecystectomy.  Patient agrees with plan for discharge home and general surgery follow-up  Rx: Bentyl  Dispo: Discharge home with general surgery follow-up   Merwyn Katos, MD 02/16/23 [id number]

## 2023-02-16 NOTE — ED Triage Notes (Signed)
PT states he started hurting in the center of his chest around 8am today. Pt states he also had shortness of breath and nausea. This continues in triage.

## 2023-02-21 ENCOUNTER — Telehealth: Payer: Self-pay

## 2023-02-21 NOTE — Telephone Encounter (Signed)
Transition Care Management Unsuccessful Follow-up Telephone Call  Date of discharge and from where:  02/16/2023 [facility]  Attempts:  1st Attempt  Reason for unsuccessful TCM follow-up call:  No answer/busy  Jennifermarie Franzen [facility]  [facility] Population Health Community Resource Care Guide   [email or web address]  ?? [phone]   Website: [email or web address]  [email or web address]

## 2023-02-22 ENCOUNTER — Telehealth: Payer: Self-pay

## 2023-02-22 NOTE — Telephone Encounter (Signed)
Transition Care Management Unsuccessful Follow-up Telephone Call  Date of discharge and from where:  02/16/2023 [facility]  Attempts:  2nd Attempt  Reason for unsuccessful TCM follow-up call:  Unable to leave message  Tersa Fotopoulos [facility]  [facility] Population Health Community Resource Care Guide   [email or web address]  ?? [phone]   Website: [email or web address]  [email or web address]

## 2023-02-25 ENCOUNTER — Telehealth: Payer: Self-pay | Admitting: Internal Medicine

## 2023-02-25 NOTE — Telephone Encounter (Signed)
FYI: This call has been transferred to Access Nurse. Once the result note has been entered staff can address the message at that time.  Patient called in with the following symptoms:  Red Word: tightness in chest,breathing difficulties   Please advise at [facility] [phone]  Message is routed to Provider Pool and [facility] Triage

## 2023-02-26 NOTE — Telephone Encounter (Signed)
Called pt to follow up since Access nurse wasn't able to triage pt.  Pt gave phone to Carney Bern, spouse reporting that he could not hear me well over the phone.  She reported that pt had been outside yesterday and had some chest tightness and dysnpea.  Pt reports that he is feeling back to normal today.  They thanked me for the call. Encouraged them to call back with any concerns.

## 2023-02-28 DIAGNOSIS — K8010 Calculus of gallbladder with chronic cholecystitis without obstruction: Secondary | ICD-10-CM | POA: Diagnosis not present

## 2023-03-12 DIAGNOSIS — H353290 Exudative age-related macular degeneration, unspecified eye, stage unspecified: Secondary | ICD-10-CM | POA: Diagnosis not present

## 2023-03-12 DIAGNOSIS — K8020 Calculus of gallbladder without cholecystitis without obstruction: Secondary | ICD-10-CM | POA: Diagnosis not present

## 2023-03-12 DIAGNOSIS — Z6823 Body mass index (BMI) 23.0-23.9, adult: Secondary | ICD-10-CM | POA: Diagnosis not present

## 2023-03-12 DIAGNOSIS — Z515 Encounter for palliative care: Secondary | ICD-10-CM | POA: Diagnosis not present

## 2023-03-12 DIAGNOSIS — D692 Other nonthrombocytopenic purpura: Secondary | ICD-10-CM | POA: Diagnosis not present

## 2023-03-12 DIAGNOSIS — I10 Essential (primary) hypertension: Secondary | ICD-10-CM | POA: Diagnosis not present

## 2023-03-22 ENCOUNTER — Ambulatory Visit: Payer: PPO | Admitting: Cardiovascular Disease

## 2023-04-09 ENCOUNTER — Ambulatory Visit (INDEPENDENT_AMBULATORY_CARE_PROVIDER_SITE_OTHER): Payer: PPO | Admitting: Internal Medicine

## 2023-04-09 ENCOUNTER — Encounter: Payer: Self-pay | Admitting: Internal Medicine

## 2023-04-09 VITALS — BP 122/70 | HR 72 | Temp 98.3°F | Ht 66.0 in | Wt 143.0 lb

## 2023-04-09 DIAGNOSIS — J449 Chronic obstructive pulmonary disease, unspecified: Secondary | ICD-10-CM | POA: Diagnosis not present

## 2023-04-09 DIAGNOSIS — I5042 Chronic combined systolic (congestive) and diastolic (congestive) heart failure: Secondary | ICD-10-CM | POA: Diagnosis not present

## 2023-04-09 DIAGNOSIS — G629 Polyneuropathy, unspecified: Secondary | ICD-10-CM

## 2023-04-09 DIAGNOSIS — D496 Neoplasm of unspecified behavior of brain: Secondary | ICD-10-CM

## 2023-04-09 DIAGNOSIS — Z0000 Encounter for general adult medical examination without abnormal findings: Secondary | ICD-10-CM | POA: Diagnosis not present

## 2023-04-09 DIAGNOSIS — Z23 Encounter for immunization: Secondary | ICD-10-CM | POA: Diagnosis not present

## 2023-04-09 DIAGNOSIS — F01A0 Vascular dementia, mild, without behavioral disturbance, psychotic disturbance, mood disturbance, and anxiety: Secondary | ICD-10-CM

## 2023-04-09 DIAGNOSIS — F0150 Vascular dementia without behavioral disturbance: Secondary | ICD-10-CM | POA: Insufficient documentation

## 2023-04-09 DIAGNOSIS — I480 Paroxysmal atrial fibrillation: Secondary | ICD-10-CM | POA: Diagnosis not present

## 2023-04-09 DIAGNOSIS — H353230 Exudative age-related macular degeneration, bilateral, stage unspecified: Secondary | ICD-10-CM

## 2023-04-09 MED ORDER — GABAPENTIN 400 MG PO CAPS: 400.0000 mg | ORAL_CAPSULE | Freq: Every day | ORAL | 3 refills | Status: DC

## 2023-04-09 NOTE — Assessment & Plan Note (Signed)
Has apparent benign lesion Would recheck only if neurologic symptoms or worse headache

## 2023-04-09 NOTE — Assessment & Plan Note (Signed)
Mild without significant progression No change in functional status On quetiapine 25mg  in evening

## 2023-04-09 NOTE — Assessment & Plan Note (Signed)
No apparent paroxysms On ASA only

## 2023-04-09 NOTE — Progress Notes (Signed)
Subjective:    Patient ID: Leon Estes, male    DOB: [birth year], 89 y.o.   MRN: [id number]  HPI Here with wife for Medicare wellness visit and follow up of chronic health conditions Reviewed advanced directives Reviewed other doctors---Dr Cintron-Diaz--surgeon, Dr Shah--neurology, Dr Nahser--cardiology, Dr Lenell Antu, Dr Collier [place] Hospitalized once in December for the abdominal pain. No surgery Vision is poor--no longer getting the injections for AMD Hearing is also bad---can't afford aides No alcohol  Still chews tobacco--not ready to stop Still mows yard (riding)--helps out in the house fairly well Larey Seat once--no injury No depression or anhedonia Ongoing memory issues  No recent trouble with gallbladder Eating okay now Weight is holding No N/V No recent abdominal pain  Most trouble with toes---numb especially at night Pain also Helps if wife wraps them Using the gabapentin--does help some  Gets headache at times Tylenol does help  Still with confusion Memory problems as well Still helped by the quetiapine Sleeps fair---uses tylenol PM (asked them to stop). Recommended melatonin instead  Breathing isn't great Uses the nebulizer three times a day No regular cough  No chest pain No palpitations Gets some dizziness --upon arising (no syncope) Sleeps in bed flat. No PND No ankle edema  No heartburn No dysphagia Is on the omeprazole  Voids okay Nocturia x 2-3 Feels like he can empty  Last head CT --stable left CP angle mass (2/23) Current Outpatient Medications on File Prior to Visit  Medication Sig Dispense Refill   acetaminophen (TYLENOL) 500 MG tablet Take 1,000 mg by mouth every 6 (six) hours as needed for moderate pain or headache.     aspirin 81 MG EC tablet Take 1 tablet (81 mg total) by mouth daily. Swallow whole. 30 tablet 0   budesonide (PULMICORT) 0.5 MG/2ML nebulizer solution Take 2 mLs (0.5 mg total) by nebulization in the morning  and at bedtime. 200 mL 12   carvedilol (COREG) 3.125 MG tablet TAKE ONE TABLET BY MOUTH TWICE A DAY WITH A MEAL 180 tablet 3   furosemide (LASIX) 20 MG tablet Take 1 tablet (20 mg total) by mouth daily. 90 tablet 3   ipratropium-albuterol (DUONEB) 0.5-2.5 (3) MG/3ML SOLN Take 3 ml by nebulization 3-4 times daily as needed 360 mL 11   losartan (COZAAR) 25 MG tablet TAKE ONE HALF (1/2) TABLET BY MOUTH DAILY 45 tablet 3   Multiple Vitamin (MULTIVITAMIN WITH MINERALS) TABS tablet Take 1 tablet by mouth daily.     omeprazole (PRILOSEC) 20 MG capsule TAKE ONE CAPSULE BY MOUTH TWICE DAILY BEFORE A MEAL. ON AN EMPTY STOMACH 180 capsule 3   potassium chloride (KLOR-CON) 10 MEQ tablet TAKE ONE TABLET BY MOUTH ONCE A DAY 90 tablet 2   QUEtiapine (SEROQUEL) 25 MG tablet TAKE 1 TABLET BY MOUTH EVERY NIGHT AT BEDTIME 30 tablet 11   simvastatin (ZOCOR) 40 MG tablet TAKE ONE TABLET BY MOUTH EVERY NIGHT AT BEDTIME 90 tablet 3   tamsulosin (FLOMAX) 0.4 MG CAPS capsule Take 1 capsule (0.4 mg total) by mouth daily after supper. 100 capsule 3   traMADol (ULTRAM) 50 MG tablet TAKE ONE TABLET BY MOUTH DAILY AS NEEDED FOR PAIN (Patient taking differently: Take 50 mg by mouth daily as needed for severe pain or moderate pain.) 30 tablet 0   triamcinolone cream (KENALOG) 0.1 % APPLY ONE APPLICATION TOPICALLY TWO TIMES DAILY AS NEEDED (Patient taking differently: Apply 1 Application topically 2 (two) times daily as needed.) 454 g 1   No current  facility-administered medications on file prior to visit.    Allergies  Allergen Reactions   Augmentin [Amoxicillin-Pot Clavulanate] Rash   Bacitracin-Polymyxin B Swelling and Rash   Benzalkonium Chloride     "Benzalkonium chloride is a quaternary ammonium antiseptic and disinfectant with actions and uses similar to those of other cationic surfactants. It is also used as an antimicrobial preservative for pharmaceutical products."   Neosporin [Neomycin-Bacitracin Zn-Polymyx] Rash    Sulfamethoxazole-Trimethoprim Rash   Terbinafine And Related Rash    Past Medical History:  Diagnosis Date   Asthma    BPH (benign prostatic hypertrophy)    Cataract    Chronic renal disease, stage III (HCC)    Chronic sinusitis    COPD (chronic obstructive pulmonary disease) (HCC)    Coronary artery disease    2 stents   Gall stones    Hearing loss    DOES NOT WEAR HEARING AIDS   High cholesterol    Hypertension    Lactic acid acidosis 10/12/2021   Macular degeneration    right eye   Rash 12/04/2021   Septic shock (HCC) 10/10/2021    Past Surgical History:  Procedure Laterality Date   APPENDECTOMY     CORONARY ANGIOPLASTY WITH STENT PLACEMENT  1998   2 stents   TRANSURETHRAL RESECTION OF PROSTATE      Family History  Problem Relation Age of Onset   Diabetes Father    Heart disease Brother     Social History   Socioeconomic History   Marital status: Married    Spouse name: Not on file   Number of children: 4   Years of education: Not on file   Highest education level: Not on file  Occupational History   Occupation: Drove truck and concrete work  Tobacco Use   Smoking status: Former    Current packs/day: 0.00    Types: Cigarettes    Quit date: 08/13/2005    Years since quitting: 17.6    Passive exposure: Past   Smokeless tobacco: Current    Types: Chew   Tobacco comments:    discussed stopping this (only once in a while)  Vaping Use   Vaping status: Never Used  Substance and Sexual Activity   Alcohol use: No   Drug use: No   Sexual activity: Not Currently  Other Topics Concern   Not on file  Social History Narrative   No living will   Wife, then LouAnn daughter should make decisions   Would accept resuscitation   Would  not want tube feeds   Social Determinants of Health   Financial Resource Strain: Not on file  Food Insecurity: No Food Insecurity (08/07/2022)   Hunger Vital Sign    Worried About Running Out of Food in the Last Year: Never  true    Ran Out of Food in the Last Year: Never true  Transportation Needs: No Transportation Needs (08/07/2022)   PRAPARE - Administrator, Civil Service (Medical): No    Lack of Transportation (Non-Medical): No  Physical Activity: Not on file  Stress: Not on file  Social Connections: Not on file  Intimate Partner Violence: Not on file   Review of Systems Wears seat belt No teeth or dentures No suspicious skin lesions---easy bruising Some itching--TAC does help No problems with diarrhea of late Ongoing hand pain--OA    Objective:   Physical Exam Constitutional:      Appearance: Normal appearance.  HENT:     Mouth/Throat:  Pharynx: No oropharyngeal exudate or posterior oropharyngeal erythema.  Eyes:     Conjunctiva/sclera: Conjunctivae normal.     Pupils: Pupils are equal, round, and reactive to light.     Comments: Alternating exotropia  Cardiovascular:     Rate and Rhythm: Normal rate and regular rhythm.     Pulses: Normal pulses.     Heart sounds: No murmur heard.    No gallop.  Pulmonary:     Effort: Pulmonary effort is normal.     Breath sounds: No wheezing or rales.     Comments: Very little air movement but clear and not tight Abdominal:     Palpations: Abdomen is soft.     Tenderness: There is no abdominal tenderness.  Musculoskeletal:     Cervical back: Neck supple.     Right lower leg: No edema.     Left lower leg: No edema.  Lymphadenopathy:     Cervical: No cervical adenopathy.  Skin:    Findings: No lesion or rash.  Neurological:     General: No focal deficit present.     Mental Status: He is alert and oriented to person, place, and time.     Comments: December-- 2023 President-- "I don't keep up with that stuff" Did know Trump was running again---didn't know Harris Hard even remembering the words at first--then 0/3 after 30 seconds  Psychiatric:        Mood and Affect: Mood normal.        Behavior: Behavior normal.             Assessment & Plan:

## 2023-04-09 NOTE — Addendum Note (Signed)
Addended by: Eual Fines on: 04/09/2023 12:43 PM   Modules accepted: Orders

## 2023-04-09 NOTE — Assessment & Plan Note (Signed)
I have personally reviewed the Medicare Annual Wellness questionnaire and have noted 1. The patient's medical and social history 2. Their use of alcohol, tobacco or illicit drugs 3. Their current medications and supplements 4. The patient's functional ability including ADL's, fall risks, home safety risks and hearing or visual             impairment. 5. Diet and physical activities 6. Evidence for depression or mood disorders  The patients weight, height, BMI and visual acuity have been recorded in the chart I have made referrals, counseling and provided education to the patient based review of the above and I have provided the pt with a written personalized care plan for preventive services.  I have provided you with a copy of your personalized plan for preventive services. Please take the time to review along with your updated medication list.  Does do some activity--but limited Recommended COVID update--not so sure One time RSV Flu vaccine today No cancer screening due to age

## 2023-04-09 NOTE — Assessment & Plan Note (Signed)
Vision is poor--but done with injections

## 2023-04-09 NOTE — Assessment & Plan Note (Signed)
Stable DOE Uses budesonide and duoneb nebs

## 2023-04-09 NOTE — Patient Instructions (Addendum)
For the nerve pain in your feet---you can try over the counter lidocaine or capsaicin cream (may cause initial stinging or burning) You can also try TENS---transcutaneous electrical stimulation--for this  You can try over the counter diclofenac cream for your hands.

## 2023-04-09 NOTE — Progress Notes (Signed)
Vision Screening   Right eye Left eye Both eyes  Without correction 0 0 20/200  With correction     Hearing Screening - Comments:: Aware of hearing issues. Does not have hearing aids.

## 2023-04-09 NOTE — Assessment & Plan Note (Signed)
Reviewed treatment options Gabapentin 400-800 at bedtime

## 2023-04-09 NOTE — Assessment & Plan Note (Signed)
Compensated with the carvedilol 3.125 bid, losartan 25 daily and furosemide 20 daily

## 2023-04-16 ENCOUNTER — Other Ambulatory Visit: Payer: Self-pay | Admitting: Internal Medicine

## 2023-04-17 ENCOUNTER — Ambulatory Visit: Payer: PPO | Admitting: Cardiovascular Disease

## 2023-04-17 NOTE — Telephone Encounter (Signed)
Refill request for traMADol (ULTRAM) 50 MG tablet   LOV - 04/09/23 Next OV - 10/10/23 Last refill - 01/24/23 #30/0

## 2023-05-17 ENCOUNTER — Ambulatory Visit: Payer: PPO | Attending: Cardiovascular Disease | Admitting: Nurse Practitioner

## 2023-05-17 ENCOUNTER — Encounter: Payer: Self-pay | Admitting: Nurse Practitioner

## 2023-05-17 VITALS — BP 132/74 | HR 74 | Ht 66.0 in | Wt 145.6 lb

## 2023-05-17 DIAGNOSIS — I2510 Atherosclerotic heart disease of native coronary artery without angina pectoris: Secondary | ICD-10-CM | POA: Diagnosis not present

## 2023-05-17 DIAGNOSIS — J449 Chronic obstructive pulmonary disease, unspecified: Secondary | ICD-10-CM | POA: Diagnosis not present

## 2023-05-17 DIAGNOSIS — I10 Essential (primary) hypertension: Secondary | ICD-10-CM | POA: Diagnosis not present

## 2023-05-17 DIAGNOSIS — I5022 Chronic systolic (congestive) heart failure: Secondary | ICD-10-CM | POA: Diagnosis not present

## 2023-05-17 DIAGNOSIS — E785 Hyperlipidemia, unspecified: Secondary | ICD-10-CM

## 2023-05-17 DIAGNOSIS — N183 Chronic kidney disease, stage 3 unspecified: Secondary | ICD-10-CM | POA: Diagnosis not present

## 2023-05-17 DIAGNOSIS — Z01810 Encounter for preprocedural cardiovascular examination: Secondary | ICD-10-CM | POA: Diagnosis not present

## 2023-05-17 NOTE — Progress Notes (Unsigned)
Office Visit    Patient Name: Leon Estes Date of Encounter: 05/17/2023  Primary Care Provider:  Karie Schwalbe, MD Primary Cardiologist:  Kristeen Miss, MD  Chief Complaint    89 year old male with a history of CAD s/p stenting x2-RCA in 1997, chronic systolic heart failure, hypertension, hyperlipidemia, CKD stage III, BPH, macular degeneration, and COPD who presents for follow-up related to CAD.  Past Medical History    Past Medical History:  Diagnosis Date   Asthma    BPH (benign prostatic hypertrophy)    Cataract    Chronic renal disease, stage III (HCC)    Chronic sinusitis    COPD (chronic obstructive pulmonary disease) (HCC)    Coronary artery disease    2 stents   Gall stones    Hearing loss    DOES NOT WEAR HEARING AIDS   High cholesterol    Hypertension    Lactic acid acidosis 10/12/2021   Macular degeneration    right eye   Rash 12/04/2021   Septic shock (HCC) 10/10/2021   Past Surgical History:  Procedure Laterality Date   APPENDECTOMY     CORONARY ANGIOPLASTY WITH STENT PLACEMENT  1998   2 stents   TRANSURETHRAL RESECTION OF PROSTATE      Allergies  Allergies  Allergen Reactions   Augmentin [Amoxicillin-Pot Clavulanate] Rash   Bacitracin-Polymyxin B Swelling and Rash   Benzalkonium Chloride     "Benzalkonium chloride is a quaternary ammonium antiseptic and disinfectant with actions and uses similar to those of other cationic surfactants. It is also used as an antimicrobial preservative for pharmaceutical products."   Neosporin [Neomycin-Bacitracin Zn-Polymyx] Rash   Sulfamethoxazole-Trimethoprim Rash   Terbinafine And Related Rash     Labs/Other Studies Reviewed    The following studies were reviewed today:  Cardiac Studies & Procedures       ECHOCARDIOGRAM  ECHOCARDIOGRAM COMPLETE 12/19/2021  Narrative ECHOCARDIOGRAM REPORT    Patient Name:   Leon Estes Date of Exam: 12/19/2021 Medical Rec #:  [id number]        Height:        67.0 in Accession #:    [id number]       Weight:       141.0 lb Date of Birth:  11-14-1930         BSA:          1.743 m Patient Age:    89 years         BP:           122/62 mmHg Patient Gender: M                HR:           71 bpm. Exam Location:  [address]  Procedure: 2D Echo, Cardiac Doppler and Color Doppler  Indications:    I50.22 CHF  History:        Patient has prior history of Echocardiogram examinations, most recent 10/10/2021. CHF, CAD, COPD, Arrythmias:Paroxysmal atrial fibrillation; Risk Factors:Hypertension, Dyslipidemia and Former Smoker.  Sonographer:    Samule Ohm RDCS Referring Phys: [id number] Leon Estes  IMPRESSIONS   1. LV function improved compared to previous. 2. Left ventricular ejection fraction, by estimation, is 45 to 50%. The left ventricle has mildly decreased function. The left ventricle demonstrates global hypokinesis. There is moderate left ventricular hypertrophy. Left ventricular diastolic parameters are consistent with Grade I diastolic dysfunction (impaired relaxation). Elevated left atrial pressure. 3. Right ventricular systolic function is  normal. The right ventricular size is normal. 4. Left atrial size was severely dilated. 5. Right atrial size was moderately dilated. 6. The mitral valve is normal in structure. Mild mitral valve regurgitation. No evidence of mitral stenosis. 7. The aortic valve is tricuspid. Aortic valve regurgitation is trivial. Aortic valve sclerosis is present, with no evidence of aortic valve stenosis. 8. The inferior vena cava is normal in size with greater than 50% respiratory variability, suggesting right atrial pressure of 3 mmHg.  FINDINGS Left Ventricle: Left ventricular ejection fraction, by estimation, is 45 to 50%. The left ventricle has mildly decreased function. The left ventricle demonstrates global hypokinesis. The left ventricular internal cavity size was normal in size. There is moderate left  ventricular hypertrophy. Left ventricular diastolic parameters are consistent with Grade I diastolic dysfunction (impaired relaxation). Elevated left atrial pressure.  Right Ventricle: The right ventricular size is normal. Right ventricular systolic function is normal.  Left Atrium: Left atrial size was severely dilated.  Right Atrium: Right atrial size was moderately dilated.  Pericardium: There is no evidence of pericardial effusion.  Mitral Valve: The mitral valve is normal in structure. Mild mitral valve regurgitation. No evidence of mitral valve stenosis.  Tricuspid Valve: The tricuspid valve is normal in structure. Tricuspid valve regurgitation is trivial. No evidence of tricuspid stenosis.  Aortic Valve: The aortic valve is tricuspid. Aortic valve regurgitation is trivial. Aortic valve sclerosis is present, with no evidence of aortic valve stenosis.  Pulmonic Valve: The pulmonic valve was normal in structure. Pulmonic valve regurgitation is trivial. No evidence of pulmonic stenosis.  Aorta: The aortic root is normal in size and structure.  Venous: The inferior vena cava is normal in size with greater than 50% respiratory variability, suggesting right atrial pressure of 3 mmHg.  IAS/Shunts: No atrial level shunt detected by color flow Doppler.  Additional Comments: LV function improved compared to previous.   LEFT VENTRICLE PLAX 2D LVIDd:         4.80 cm   Diastology LVIDs:         3.40 cm   LV e' medial:    4.24 cm/s LV PW:         1.60 cm   LV E/e' medial:  15.7 LV IVS:        1.60 cm   LV e' lateral:   8.38 cm/s LVOT diam:     2.60 cm   LV E/e' lateral: 7.9 LV SV:         71 LV SV Index:   41 LVOT Area:     5.31 cm   RIGHT VENTRICLE             IVC RV S prime:     21.80 cm/s  IVC diam: 0.90 cm TAPSE (M-mode): 1.8 cm RVSP:           26.6 mmHg  LEFT ATRIUM             Index        RIGHT ATRIUM           Index LA diam:        4.70 cm 2.70 cm/m   RA Pressure: 3.00  mmHg LA Vol (A2C):   63.4 ml 36.37 ml/m  RA Area:     17.30 cm LA Vol (A4C):   83.8 ml 48.08 ml/m  RA Volume:   42.30 ml  24.27 ml/m LA Biplane Vol: 80.5 ml 46.18 ml/m AORTIC VALVE LVOT Vmax:   67.10 cm/s  LVOT Vmean:  43.100 cm/s LVOT VTI:    0.133 m  AORTA Ao Root diam: 3.70 cm Ao Asc diam:  3.40 cm  MITRAL VALVE               TRICUSPID VALVE MV Area (PHT): 2.17 cm    TR Peak grad:   23.6 mmHg MV Decel Time: 349 msec    TR Vmax:        243.00 cm/s MV E velocity: 66.60 cm/s  Estimated RAP:  3.00 mmHg MV A velocity: 75.50 cm/s  RVSP:           26.6 mmHg MV E/A ratio:  0.88 SHUNTS Systemic VTI:  0.13 m Systemic Diam: 2.60 cm  Olga Millers MD Electronically signed by Olga Millers MD Signature Date/Time: 12/19/2021/3:37:18 PM    Final            Recent Labs: 08/04/2022: Magnesium 1.8 02/16/2023: ALT 12; BUN 12; Creatinine, Ser 1.10; Hemoglobin 11.3; Platelets 243; Potassium 3.8; Sodium 136  Recent Lipid Panel    Component Value Date/Time   TRIG 107 01/09/2021 0400    History of Present Illness    89 year old male with the above past medical history including CAD s/p stenting x2-RCA in 1997, chronic systolic heart failure, hypertension, hyperlipidemia, CKD stage III, BPH, macular degeneration, and COPD.  He had an MI in 1997 and had 2 stents placed to his RCA.  Prior EF 60 to 65%.  Was hospitalized in February/March 2023 in the setting of septic shock, [name], right lower lobe pneumonia with aspiration.  Echocardiogram at the time showed EF reduced to 30%, moderately decreased LV function, LV global hypokinesis, moderate LVH, moderately reduced RV, normal PASP, no significant valvular abnormalities.  Repeat echocardiogram in 12/2021 showed EF improved to 45 to 50%, mildly decreased LV function, LV global hypokinesis, moderate LVH, G1 DD, normal RV, mild mitral valve regurgitation.  He was hospitalized in 07/2022 in the setting of cholecystitis.  He was  treated with antibiotics.  He was last seen in the office on 09/19/2022 and was stable from a cardiac standpoint.  He denied symptoms concerning for angina.  He was evaluated in the ED in July 2024 in the setting of right upper quadrant pain concerning for cholecystitis.  CT of the abdomen showed likely chronic cholecystitis with cholelithiasis.  Outpatient follow-up with PCP and general surgery was recommended.  He presents today for follow-up.  Since his last visit accompanied by his wife and daughter.  Stable from a cardiac standpoint.  Denies any symptoms concerning for angina.  He has stable mild dyspnea with exertion, unchanged from prior visits, this is likely in the setting of COPD.  He notes dependent nonpitting lower extremity edema, he has left toe pain that occurs primarily at night, somewhat relieved with gabapentin.  He has been told previously that he has neuropathy.  He has strong pulses to his feet bilaterally.  He is possibly considering gallbladder surgery (he was previously cleared for this by Dr. Elease Hashimoto but surgery was never completed.  He is able to complete greater than 4 METS, symptoms have been stable from a cardiac standpoint.  He would be at acceptable risk to proceed should he choose.  Follow-up in 6 months with Dr. Angelina Ok.   CAD: Chronic systolic heart failure: Hypertension: Hyperlipidemia: CKD stage III: Cholecystitis: COPD: Disposition:  Home Medications    Current Outpatient Medications  Medication Sig Dispense Refill   acetaminophen (TYLENOL) 500 MG tablet Take 1,000 mg  by mouth every 6 (six) hours as needed for moderate pain or headache.     aspirin 81 MG EC tablet Take 1 tablet (81 mg total) by mouth daily. Swallow whole. 30 tablet 0   budesonide (PULMICORT) 0.5 MG/2ML nebulizer solution Take 2 mLs (0.5 mg total) by nebulization in the morning and at bedtime. 200 mL 12   carvedilol (COREG) 3.125 MG tablet TAKE ONE TABLET BY MOUTH TWICE A DAY WITH A MEAL 180  tablet 3   furosemide (LASIX) 20 MG tablet Take 1 tablet (20 mg total) by mouth daily. 90 tablet 3   gabapentin (NEURONTIN) 400 MG capsule Take 1-2 capsules (400-800 mg total) by mouth at bedtime. 180 capsule 3   ipratropium-albuterol (DUONEB) 0.5-2.5 (3) MG/3ML SOLN Take 3 ml by nebulization 3-4 times daily as needed 360 mL 11   losartan (COZAAR) 25 MG tablet TAKE ONE HALF (1/2) TABLET BY MOUTH DAILY 45 tablet 3   Multiple Vitamin (MULTIVITAMIN WITH MINERALS) TABS tablet Take 1 tablet by mouth daily.     omeprazole (PRILOSEC) 20 MG capsule TAKE ONE CAPSULE BY MOUTH TWICE DAILY BEFORE A MEAL. ON AN EMPTY STOMACH 180 capsule 3   potassium chloride (KLOR-CON) 10 MEQ tablet TAKE ONE TABLET BY MOUTH ONCE A DAY 90 tablet 2   QUEtiapine (SEROQUEL) 25 MG tablet TAKE 1 TABLET BY MOUTH EVERY NIGHT AT BEDTIME 30 tablet 11   simvastatin (ZOCOR) 40 MG tablet TAKE ONE TABLET BY MOUTH EVERY NIGHT AT BEDTIME 90 tablet 3   tamsulosin (FLOMAX) 0.4 MG CAPS capsule Take 1 capsule (0.4 mg total) by mouth daily after supper. 100 capsule 3   traMADol (ULTRAM) 50 MG tablet Take 1 tablet (50 mg total) by mouth daily as needed for severe pain or moderate pain. 30 tablet 0   triamcinolone cream (KENALOG) 0.1 % APPLY ONE APPLICATION TOPICALLY TWO TIMES DAILY AS NEEDED (Patient taking differently: Apply 1 Application topically 2 (two) times daily as needed.) 454 g 1   No current facility-administered medications for this visit.     Review of Systems    ***.  All other systems reviewed and are otherwise negative except as noted above.    Physical Exam    VS:  BP 132/74   Pulse 74   Ht 5\' 6"  (1.676 m)   Wt 145 lb 9.6 oz (66 kg)   SpO2 98%   BMI 23.50 kg/m   GEN: Well nourished, well developed, in no acute distress. HEENT: normal. Neck: Supple, no JVD, carotid bruits, or masses. Cardiac: RRR, no murmurs, rubs, or gallops. No clubbing, cyanosis, edema.  Radials/DP/PT 2+ and equal bilaterally.  Respiratory:   Respirations regular and unlabored, clear to auscultation bilaterally. GI: Soft, nontender, nondistended, BS + x 4. MS: no deformity or atrophy. Skin: warm and dry, no rash. Neuro:  Strength and sensation are intact. Psych: Normal affect.  Accessory Clinical Findings    ECG personally reviewed by me today - EKG Interpretation Date/Time:  Friday May 17 2023 10:37:02 EDT Ventricular Rate:  74 PR Interval:  210 QRS Duration:  142 QT Interval:  442 QTC Calculation: 490 R Axis:   13  Text Interpretation: Sinus rhythm with 1st degree A-V block with occasional and consecutive Premature ventricular complexes Right bundle branch block T wave abnormality, consider inferior ischemia When compared with ECG of 16-Feb-2023 12:03, Premature ventricular complexes are now Present Minimal criteria for Inferior infarct are no longer Present Confirmed by Bernadene Person ([id number]) on 05/17/2023 10:48:41 AM  -  no acute changes.   Lab Results  Component Value Date   WBC 14.1 (H) 02/16/2023   HGB 11.3 (L) 02/16/2023   HCT 37.1 (L) 02/16/2023   MCV 88.5 02/16/2023   PLT 243 02/16/2023   Lab Results  Component Value Date   CREATININE 1.10 02/16/2023   BUN 12 02/16/2023   NA 136 02/16/2023   K 3.8 02/16/2023   CL 104 02/16/2023   CO2 24 02/16/2023   Lab Results  Component Value Date   ALT 12 02/16/2023   AST 19 02/16/2023   ALKPHOS 80 02/16/2023   BILITOT 0.7 02/16/2023   Lab Results  Component Value Date   TRIG 107 01/09/2021    Lab Results  Component Value Date   HGBA1C 6.5 (H) 01/06/2021    Assessment & Plan    1.  ***      Joylene Grapes, NP 05/17/2023, 10:49 AM

## 2023-05-17 NOTE — Patient Instructions (Signed)
Medication Instructions:  Your physician recommends that you continue on your current medications as directed. Please refer to the Current Medication list given to you today.  *If you need a refill on your cardiac medications before your next appointment, please call your pharmacy*   Lab Work: NONE ordered at this time of appointment     Testing/Procedures: NONE ordered at this time of appointment     Follow-Up: At [facility], you and your health needs are our priority.  As part of our continuing mission to provide you with exceptional heart care, we have created designated Provider Care Teams.  These Care Teams include your primary Cardiologist (physician) and Advanced Practice Providers (APPs -  Physician Assistants and Nurse Practitioners) who all work together to provide you with the care you need, when you need it.  We recommend signing up for the patient portal called "MyChart".  Sign up information is provided on this After Visit Summary.  MyChart is used to connect with patients for Virtual Visits (Telemedicine).  Patients are able to view lab/test results, encounter notes, upcoming appointments, etc.  Non-urgent messages can be sent to your provider as well.   To learn more about what you can do with MyChart, go to [email or web address].    Your next appointment:   6 month(s)  Provider:   Kristeen Miss, MD

## 2023-05-18 ENCOUNTER — Encounter: Payer: Self-pay | Admitting: Nurse Practitioner

## 2023-05-29 DIAGNOSIS — D692 Other nonthrombocytopenic purpura: Secondary | ICD-10-CM | POA: Diagnosis not present

## 2023-05-29 DIAGNOSIS — Z515 Encounter for palliative care: Secondary | ICD-10-CM | POA: Diagnosis not present

## 2023-05-29 DIAGNOSIS — I5022 Chronic systolic (congestive) heart failure: Secondary | ICD-10-CM | POA: Diagnosis not present

## 2023-06-06 ENCOUNTER — Emergency Department: Payer: PPO

## 2023-06-06 ENCOUNTER — Other Ambulatory Visit: Payer: Self-pay

## 2023-06-06 ENCOUNTER — Emergency Department
Admission: EM | Admit: 2023-06-06 | Discharge: 2023-06-06 | Disposition: A | Discharge status: Home / Self Care | Payer: PPO | Attending: Emergency Medicine | Admitting: Emergency Medicine

## 2023-06-06 DIAGNOSIS — J449 Chronic obstructive pulmonary disease, unspecified: Secondary | ICD-10-CM | POA: Insufficient documentation

## 2023-06-06 DIAGNOSIS — R140 Abdominal distension (gaseous): Secondary | ICD-10-CM | POA: Diagnosis not present

## 2023-06-06 DIAGNOSIS — R1011 Right upper quadrant pain: Secondary | ICD-10-CM | POA: Diagnosis not present

## 2023-06-06 DIAGNOSIS — N189 Chronic kidney disease, unspecified: Secondary | ICD-10-CM | POA: Diagnosis not present

## 2023-06-06 DIAGNOSIS — R1084 Generalized abdominal pain: Secondary | ICD-10-CM | POA: Diagnosis not present

## 2023-06-06 DIAGNOSIS — R0989 Other specified symptoms and signs involving the circulatory and respiratory systems: Secondary | ICD-10-CM | POA: Diagnosis not present

## 2023-06-06 DIAGNOSIS — K8020 Calculus of gallbladder without cholecystitis without obstruction: Secondary | ICD-10-CM | POA: Diagnosis not present

## 2023-06-06 DIAGNOSIS — I129 Hypertensive chronic kidney disease with stage 1 through stage 4 chronic kidney disease, or unspecified chronic kidney disease: Secondary | ICD-10-CM | POA: Diagnosis not present

## 2023-06-06 DIAGNOSIS — R1013 Epigastric pain: Secondary | ICD-10-CM | POA: Diagnosis not present

## 2023-06-06 DIAGNOSIS — I2510 Atherosclerotic heart disease of native coronary artery without angina pectoris: Secondary | ICD-10-CM | POA: Insufficient documentation

## 2023-06-06 DIAGNOSIS — R079 Chest pain, unspecified: Secondary | ICD-10-CM | POA: Diagnosis not present

## 2023-06-06 DIAGNOSIS — R1010 Upper abdominal pain, unspecified: Secondary | ICD-10-CM | POA: Diagnosis not present

## 2023-06-06 LAB — COMPREHENSIVE METABOLIC PANEL
ALT: 14 U/L (ref 0–44)
AST: 29 U/L (ref 15–41)
Albumin: 4.1 g/dL (ref 3.5–5.0)
Alkaline Phosphatase: 92 U/L (ref 38–126)
Anion gap: 10 (ref 5–15)
BUN: 12 mg/dL (ref 8–23)
CO2: 24 mmol/L (ref 22–32)
Calcium: 9.3 mg/dL (ref 8.9–10.3)
Chloride: 103 mmol/L (ref 98–111)
Creatinine, Ser: 0.96 mg/dL (ref 0.61–1.24)
GFR, Estimated: >60 mL/min (ref >60)
Glucose, Bld: 125 mg/dL — ABNORMAL HIGH (ref 70–99)
Potassium: 4.3 mmol/L (ref 3.5–5.1)
Sodium: 137 mmol/L (ref 135–145)
Total Bilirubin: 1 mg/dL (ref 0.3–1.2)
Total Protein: 7.9 g/dL (ref 6.5–8.1)

## 2023-06-06 LAB — CBC
HCT: 35.9 % — ABNORMAL LOW (ref 39.0–52.0)
Hemoglobin: 11.1 g/dL — ABNORMAL LOW (ref 13.0–17.0)
MCH: 27.1 pg (ref 26.0–34.0)
MCHC: 30.9 g/dL (ref 30.0–36.0)
MCV: 87.8 fL (ref 80.0–100.0)
Platelets: 263 10*3/uL (ref 150–400)
RBC: 4.09 MIL/uL — ABNORMAL LOW (ref 4.22–5.81)
RDW: 15 % (ref 11.5–15.5)
WBC: 12.3 10*3/uL — ABNORMAL HIGH (ref 4.0–10.5)
nRBC: 0 % (ref 0.0–0.2)

## 2023-06-06 LAB — URINALYSIS, ROUTINE W REFLEX MICROSCOPIC
Bacteria, UA: NONE SEEN
Bilirubin Urine: NEGATIVE
Glucose, UA: NEGATIVE mg/dL
Ketones, ur: 5 mg/dL — AB
Leukocytes,Ua: NEGATIVE
Nitrite: NEGATIVE
Protein, ur: 100 mg/dL — AB
Specific Gravity, Urine: 1.012 (ref 1.005–1.030)
Squamous Epithelial / HPF: 0 /[HPF] (ref 0–5)
pH: 6 (ref 5.0–8.0)

## 2023-06-06 LAB — TROPONIN I (HIGH SENSITIVITY): Troponin I (High Sensitivity): 12 ng/L (ref <18)

## 2023-06-06 LAB — LIPASE, BLOOD: Lipase: 25 U/L (ref 11–51)

## 2023-06-06 MED ORDER — LIDOCAINE VISCOUS HCL 2 % MT SOLN
15.0000 mL | Freq: Once | OROMUCOSAL | Status: AC
  Administered 2023-06-06: 15 mL via OROMUCOSAL
  Filled 2023-06-06: qty 15

## 2023-06-06 MED ORDER — SUCRALFATE 1 G PO TABS: 1.0000 g | ORAL_TABLET | Freq: Four times a day (QID) | ORAL | 1 refills | Status: DC

## 2023-06-06 MED ORDER — PANTOPRAZOLE SODIUM 40 MG IV SOLR
40.0000 mg | Freq: Once | INTRAVENOUS | Status: AC
  Administered 2023-06-06: 40 mg via INTRAVENOUS
  Filled 2023-06-06: qty 10

## 2023-06-06 MED ORDER — ALUM & MAG HYDROXIDE-SIMETH 200-200-20 MG/5ML PO SUSP
30.0000 mL | Freq: Once | ORAL | Status: AC
  Administered 2023-06-06: 30 mL via ORAL
  Filled 2023-06-06: qty 30

## 2023-06-06 MED ORDER — PANTOPRAZOLE SODIUM 40 MG PO TBEC: 40.0000 mg | DELAYED_RELEASE_TABLET | Freq: Every day | ORAL | 1 refills | Status: DC

## 2023-06-06 NOTE — ED Notes (Addendum)
D/C, new RX, and reasons to return discussed with pt, pt verbalized understanding NAD noted.   First encounter with pt to D/C.

## 2023-06-06 NOTE — ED Provider Notes (Signed)
[facility] Provider Note   Event Date/Time   First MD Initiated Contact with Patient 06/06/23 [phone]     (approximate) History  Abdominal Pain  HPI Leon Estes is a 89 y.o. male with a past medical history of CAD, COPD, hypertension, and chronic renal disease who presents complaining of midepigastric abdominal pain that woke him from sleep approximate 430 last night.  Patient arrives via EMS complaining of 10/10 midepigastric abdominal pain that does not radiate and is not worsened with drinking any water.  Patient states that he does have a history of of gastric ulcers but was told that they have cleared up.  Patient denies any exertional worsening of this pain. ROS: Patient currently denies any vision changes, tinnitus, difficulty speaking, facial droop, sore throat, shortness of breath, nausea/vomiting/diarrhea, dysuria, or weakness/numbness/paresthesias in any extremity   Physical Exam  Triage Vital Signs: ED Triage Vitals  Encounter Vitals Group     BP 06/06/23 0932 (!) 183/85     Systolic BP Percentile --      Diastolic BP Percentile --      Pulse Rate 06/06/23 0932 76     Resp 06/06/23 0932 18     Temp 06/06/23 0934 97.8 F (36.6 C)     Temp Source 06/06/23 0934 Oral     SpO2 06/06/23 0932 100 %     Weight 06/06/23 0934 140 lb 10.5 oz (63.8 kg)     Height 06/06/23 0934 5\' 6"  (1.676 m)     Head Circumference --      Peak Flow --      Pain Score 06/06/23 0933 10     Pain Loc --      Pain Education --      Exclude from Growth Chart --    Most recent vital signs: Vitals:   06/06/23 1500 06/06/23 1530  BP:    Pulse: 83 81  Resp: 16 (!) 22  Temp:    SpO2: 100% 98%   General: Awake, oriented x4. CV:  Good peripheral perfusion.  Resp:  Normal effort.  Abd:  No distention.  Mild midepigastric tenderness to palpation Other:  Elderly well-developed, well-nourished Caucasian male resting comfortably in no acute distress ED Results / Procedures  / Treatments  Labs (all labs ordered are listed, but only abnormal results are displayed) Labs Reviewed  COMPREHENSIVE METABOLIC PANEL - Abnormal; Notable for the following components:      Result Value   Glucose, Bld 125 (*)    All other components within normal limits  URINALYSIS, ROUTINE W REFLEX MICROSCOPIC - Abnormal; Notable for the following components:   Color, Urine STRAW (*)    APPearance CLEAR (*)    Hgb urine dipstick SMALL (*)    Ketones, ur 5 (*)    Protein, ur 100 (*)    All other components within normal limits  CBC - Abnormal; Notable for the following components:   WBC 12.3 (*)    RBC 4.09 (*)    Hemoglobin 11.1 (*)    HCT 35.9 (*)    All other components within normal limits  LIPASE, BLOOD  TROPONIN I (HIGH SENSITIVITY)  TROPONIN I (HIGH SENSITIVITY)   EKG ED ECG REPORT I, Merwyn Katos, the attending physician, personally viewed and interpreted this ECG. Date: 06/06/2023 EKG Time: 1019 Rate: 73 Rhythm: normal sinus rhythm QRS Axis: normal Intervals: normal ST/T Wave abnormalities: normal Narrative Interpretation: no evidence of acute ischemia RADIOLOGY ED MD interpretation: Single view  portable chest x-ray shows cardiomegaly with lower lung volumes  Right upper quadrant ultrasound shows a contracted gallbladder around stones without any strong evidence of acute cholecystitis -Agree with radiology assessment Official radiology report(s): DG Chest Port 1 View  Result Date: 06/06/2023 CLINICAL DATA:  89 year old male with chest and epigastric pain. Right upper quadrant pain. EXAM: PORTABLE CHEST 1 VIEW COMPARISON:  CT Abdomen and Pelvis 02/16/2023 and earlier. FINDINGS: Portable AP upright view at 1048 hours. Lower lung volumes compared to July radiographs. Stable cardiomegaly and mediastinal contours. Allowing for portable technique the lungs are clear. No pneumothorax or pleural effusion. Paucity of bowel gas. No acute osseous abnormality identified.  IMPRESSION: Cardiomegaly and lower lung volumes. No acute cardiopulmonary abnormality. Electronically Signed   By: Odessa Fleming M.D.   On: 06/06/2023 11:47   US ABDOMEN LIMITED RUQ (LIVER/GB)  Result Date: 06/06/2023 CLINICAL DATA:  89 year old male with right upper quadrant pain. EXAM: ULTRASOUND ABDOMEN LIMITED RIGHT UPPER QUADRANT COMPARISON:  CT Abdomen and Pelvis 02/16/2023 and earlier. FINDINGS: Gallbladder: Wall echo shadow sign (Wes sign) indicating a gallbladder contracted around numerous stones. No sonographic Murphy sign elicited. No pericholecystic fluid is evident. Common bile duct: Diameter: 7 mm, versus 6 mm on the July CT. This is the upper limits of normal. No filling defect within the visible duct. Liver: Liver echogenicity within normal limits. No intrahepatic biliary ductal dilatation identified. No discrete liver lesion. Portal vein is patent on color Doppler imaging with normal direction of blood flow towards the liver. Other: Negative visible right kidney.  No free fluid. IMPRESSION: Gallbladder contracted around stones. No strong evidence of acute cholecystitis or bile duct obstruction. Electronically Signed   By: Odessa Fleming M.D.   On: 06/06/2023 11:40   PROCEDURES: Critical Care performed: No .1-3 Lead EKG Interpretation  Performed by: Merwyn Katos, MD Authorized by: Merwyn Katos, MD     Interpretation: normal     ECG rate:  71   ECG rate assessment: normal     Rhythm: sinus rhythm     Ectopy: none     Conduction: normal    MEDICATIONS ORDERED IN ED: Medications  alum & mag hydroxide-simeth (MAALOX/MYLANTA) 200-200-20 MG/5ML suspension 30 mL (30 mLs Oral Given 06/06/23 1011)  lidocaine (XYLOCAINE) 2 % viscous mouth solution 15 mL (15 mLs Mouth/Throat Given 06/06/23 1012)  pantoprazole (PROTONIX) injection 40 mg (40 mg Intravenous Given 06/06/23 1320)   IMPRESSION / MDM / ASSESSMENT AND PLAN / ED COURSE  I reviewed the triage vital signs and the nursing notes.                              The patient is on the cardiac monitor to evaluate for evidence of arrhythmia and/or significant heart rate changes. Patient's presentation is most consistent with acute presentation with potential threat to life or bodily function. Patient presents for abdominal pain.  Differential diagnosis includes appendicitis, abdominal aortic aneurysm, surgical biliary disease, pancreatitis, SBO, mesenteric ischemia, serious intra-abdominal bacterial illness, genital torsion. Doubt atypical ACS. Based on history, physical exam, radiologic/laboratory evaluation, there is no red flag results or symptomatology requiring emergent intervention or need for admission at this time Pt tolerating PO. Disposition: Patient will be discharged with strict return precautions and follow up with primary MD within 12-24 hours for further evaluation. Patient understands that this still may have an early presentation of an emergent medical condition such as appendicitis that will require a  recheck.   FINAL CLINICAL IMPRESSION(S) / ED DIAGNOSES   Final diagnoses:  Epigastric pain  Calculus of gallbladder without cholecystitis without obstruction   Rx / DC Orders   ED Discharge Orders          Ordered    sucralfate (CARAFATE) 1 g tablet  4 times daily        06/06/23 1318    pantoprazole (PROTONIX) 40 MG tablet  Daily        06/06/23 1318           Note:  This document was prepared using Dragon voice recognition software and may include unintentional dictation errors.   Merwyn Katos, MD 06/06/23 [phone]

## 2023-06-06 NOTE — ED Notes (Signed)
Pt. Provided crackers and apple sauce for PO challenge per MD request.

## 2023-06-06 NOTE — ED Notes (Signed)
Pt. Consumed crackers and apple sauce without incident. States his stomach still hurts. Dr. Vicente Males notified.

## 2023-06-06 NOTE — ED Triage Notes (Addendum)
Pt. To ED from home for epigastric and midline abdominal since 0500. Pt. States pain is sharp, woke him from sleep. Pt. Had emesis this AM. Denies diarrhea. Denies CP/ SOB. Las BM yesterday.

## 2023-06-07 ENCOUNTER — Telehealth: Payer: Self-pay

## 2023-06-07 NOTE — Telephone Encounter (Signed)
Called and spoke to pt's wife to see how he was doing after ER visit yesterday. She said he felt a lot better today. They gave him pantoprazole IV yesterday and sent him home with a rx of that and sucralfate.

## 2023-06-17 ENCOUNTER — Ambulatory Visit (INDEPENDENT_AMBULATORY_CARE_PROVIDER_SITE_OTHER): Payer: PPO | Admitting: Internal Medicine

## 2023-06-17 ENCOUNTER — Encounter: Payer: Self-pay | Admitting: Internal Medicine

## 2023-06-17 VITALS — BP 122/60 | HR 66 | Temp 98.4°F | Ht 66.0 in | Wt 146.0 lb

## 2023-06-17 DIAGNOSIS — K8020 Calculus of gallbladder without cholecystitis without obstruction: Secondary | ICD-10-CM | POA: Diagnosis not present

## 2023-06-17 DIAGNOSIS — I5042 Chronic combined systolic (congestive) and diastolic (congestive) heart failure: Secondary | ICD-10-CM | POA: Diagnosis not present

## 2023-06-17 DIAGNOSIS — K219 Gastro-esophageal reflux disease without esophagitis: Secondary | ICD-10-CM | POA: Diagnosis not present

## 2023-06-17 NOTE — Assessment & Plan Note (Signed)
Has had vague atypical chest pain---doesn't sound ischemic but hard to tell EF was near normal on past check Discussed monitoring symptoms Is on carvedilol 3/125 bid, losartan 25 and simvastatin

## 2023-06-17 NOTE — Assessment & Plan Note (Signed)
Had another episode of abdominal pain Not clearly reflux Contracted gallbladder but didn't show inflammation Better now over the last week Observation only

## 2023-06-17 NOTE — Progress Notes (Signed)
Subjective:    Patient ID: Leon Estes, male    DOB: [birth year], 89 y.o.   MRN: [id number]  HPI Here with wife for ER follow up Was seen 10/24  Woke about 4AM with stomach pain Fairly severe and didn't resolve--so wife called rescue Pain persisted for hours Got labs/ultrasound---normal LFTs and ultrasound showed contracted gallbladder around stones (without clear inflammation) Did improve there--so they sent him home  No recurrence of pain Appetite is okay Does have some chest pain or arms/legs--worries about his heart Usually at rest---no clear relationship to eating ?slight SOB with these  Current Outpatient Medications on File Prior to Visit  Medication Sig Dispense Refill   acetaminophen (TYLENOL) 500 MG tablet Take 1,000 mg by mouth every 6 (six) hours as needed for moderate pain or headache.     aspirin 81 MG EC tablet Take 1 tablet (81 mg total) by mouth daily. Swallow whole. 30 tablet 0   budesonide (PULMICORT) 0.5 MG/2ML nebulizer solution Take 2 mLs (0.5 mg total) by nebulization in the morning and at bedtime. 200 mL 12   carvedilol (COREG) 3.125 MG tablet TAKE ONE TABLET BY MOUTH TWICE A DAY WITH A MEAL 180 tablet 3   furosemide (LASIX) 20 MG tablet Take 1 tablet (20 mg total) by mouth daily. 90 tablet 3   gabapentin (NEURONTIN) 400 MG capsule Take 1-2 capsules (400-800 mg total) by mouth at bedtime. 180 capsule 3   ipratropium-albuterol (DUONEB) 0.5-2.5 (3) MG/3ML SOLN Take 3 ml by nebulization 3-4 times daily as needed 360 mL 11   losartan (COZAAR) 25 MG tablet TAKE ONE HALF (1/2) TABLET BY MOUTH DAILY 45 tablet 3   Multiple Vitamin (MULTIVITAMIN WITH MINERALS) TABS tablet Take 1 tablet by mouth daily.     pantoprazole (PROTONIX) 40 MG tablet Take 1 tablet (40 mg total) by mouth daily. 30 tablet 1   potassium chloride (KLOR-CON) 10 MEQ tablet TAKE ONE TABLET BY MOUTH ONCE A DAY 90 tablet 2   QUEtiapine (SEROQUEL) 25 MG tablet TAKE 1 TABLET BY MOUTH EVERY NIGHT AT  BEDTIME 30 tablet 11   simvastatin (ZOCOR) 40 MG tablet TAKE ONE TABLET BY MOUTH EVERY NIGHT AT BEDTIME 90 tablet 3   sucralfate (CARAFATE) 1 g tablet Take 1 tablet (1 g total) by mouth 4 (four) times daily. 120 tablet 1   tamsulosin (FLOMAX) 0.4 MG CAPS capsule Take 1 capsule (0.4 mg total) by mouth daily after supper. 100 capsule 3   traMADol (ULTRAM) 50 MG tablet Take 1 tablet (50 mg total) by mouth daily as needed for severe pain or moderate pain. 30 tablet 0   triamcinolone cream (KENALOG) 0.1 % APPLY ONE APPLICATION TOPICALLY TWO TIMES DAILY AS NEEDED (Patient taking differently: Apply 1 Application topically 2 (two) times daily as needed.) 454 g 1   No current facility-administered medications on file prior to visit.    Allergies  Allergen Reactions   Augmentin [Amoxicillin-Pot Clavulanate] Rash   Bacitracin-Polymyxin B Swelling and Rash   Benzalkonium Chloride     "Benzalkonium chloride is a quaternary ammonium antiseptic and disinfectant with actions and uses similar to those of other cationic surfactants. It is also used as an antimicrobial preservative for pharmaceutical products."   Neosporin [Neomycin-Bacitracin Zn-Polymyx] Rash   Sulfamethoxazole-Trimethoprim Rash   Terbinafine And Related Rash    Past Medical History:  Diagnosis Date   Asthma    BPH (benign prostatic hypertrophy)    Cataract    Chronic renal disease, stage III (  HCC)    Chronic sinusitis    COPD (chronic obstructive pulmonary disease) (HCC)    Coronary artery disease    2 stents   Gall stones    Hearing loss    DOES NOT WEAR HEARING AIDS   High cholesterol    Hypertension    Lactic acid acidosis 10/12/2021   Macular degeneration    right eye   Rash 12/04/2021   Septic shock (HCC) 10/10/2021    Past Surgical History:  Procedure Laterality Date   APPENDECTOMY     CORONARY ANGIOPLASTY WITH STENT PLACEMENT  1998   2 stents   TRANSURETHRAL RESECTION OF PROSTATE      Family History  Problem  Relation Age of Onset   Diabetes Father    Heart disease Brother     Social History   Socioeconomic History   Marital status: Married    Spouse name: Not on file   Number of children: 4   Years of education: Not on file   Highest education level: Not on file  Occupational History   Occupation: Drove truck and concrete work  Tobacco Use   Smoking status: Former    Current packs/day: 0.00    Types: Cigarettes    Quit date: 08/13/2005    Years since quitting: 17.8    Passive exposure: Past   Smokeless tobacco: Current    Types: Chew   Tobacco comments:    discussed stopping this (only once in a while)  Vaping Use   Vaping status: Never Used  Substance and Sexual Activity   Alcohol use: No   Drug use: No   Sexual activity: Not Currently  Other Topics Concern   Not on file  Social History Narrative   No living will   Wife, then LouAnn daughter should make decisions   Would accept resuscitation   Would  not want tube feeds   Social Determinants of Health   Financial Resource Strain: Not on file  Food Insecurity: No Food Insecurity (08/07/2022)   Hunger Vital Sign    Worried About Running Out of Food in the Last Year: Never true    Ran Out of Food in the Last Year: Never true  Transportation Needs: No Transportation Needs (08/07/2022)   PRAPARE - Administrator, Civil Service (Medical): No    Lack of Transportation (Non-Medical): No  Physical Activity: Not on file  Stress: Not on file  Social Connections: Not on file  Intimate Partner Violence: Not on file   Review of Systems Bowels move regularly No trouble voiding    Objective:   Physical Exam Constitutional:      Appearance: Normal appearance.  Cardiovascular:     Rate and Rhythm: Normal rate and regular rhythm.     Heart sounds: No murmur heard.    No gallop.  Pulmonary:     Effort: Pulmonary effort is normal.     Breath sounds: Normal breath sounds. No wheezing or rales.  Abdominal:      General: Bowel sounds are normal. There is no distension.     Palpations: Abdomen is soft.     Tenderness: There is no abdominal tenderness. There is no guarding or rebound.  Musculoskeletal:     Cervical back: Neck supple.     Right lower leg: No edema.     Left lower leg: No edema.  Lymphadenopathy:     Cervical: No cervical adenopathy.  Neurological:     Mental Status: He is alert.  Assessment & Plan:

## 2023-06-17 NOTE — Assessment & Plan Note (Signed)
Could be causing the chest pain Is on the pantoprazole 40mg  daily

## 2023-06-24 ENCOUNTER — Other Ambulatory Visit: Payer: Self-pay | Admitting: Internal Medicine

## 2023-07-23 ENCOUNTER — Ambulatory Visit (INDEPENDENT_AMBULATORY_CARE_PROVIDER_SITE_OTHER): Payer: PPO | Admitting: General Practice

## 2023-07-23 ENCOUNTER — Encounter: Payer: Self-pay | Admitting: General Practice

## 2023-07-23 VITALS — BP 120/64 | HR 77 | Temp 98.1°F | Ht 66.0 in | Wt 141.0 lb

## 2023-07-23 DIAGNOSIS — R1011 Right upper quadrant pain: Secondary | ICD-10-CM

## 2023-07-23 LAB — BASIC METABOLIC PANEL
BUN: 15 mg/dL (ref 6–23)
CO2: 30 meq/L (ref 19–32)
Calcium: 9 mg/dL (ref 8.4–10.5)
Chloride: 105 meq/L (ref 96–112)
Creatinine, Ser: 1.3 mg/dL (ref 0.40–1.50)
GFR: 48.42 mL/min — ABNORMAL LOW (ref >60.00)
Glucose, Bld: 124 mg/dL — ABNORMAL HIGH (ref 70–99)
Potassium: 3.4 meq/L — ABNORMAL LOW (ref 3.5–5.1)
Sodium: 142 meq/L (ref 135–145)

## 2023-07-23 LAB — CBC WITH DIFFERENTIAL/PLATELET
Basophils Absolute: 0.1 10*3/uL (ref 0.0–0.1)
Basophils Relative: 0.7 % (ref 0.0–3.0)
Eosinophils Absolute: 0.2 10*3/uL (ref 0.0–0.7)
Eosinophils Relative: 2.6 % (ref 0.0–5.0)
HCT: 34.4 % — ABNORMAL LOW (ref 39.0–52.0)
Hemoglobin: 10.7 g/dL — ABNORMAL LOW (ref 13.0–17.0)
Lymphocytes Relative: 21.1 % (ref 12.0–46.0)
Lymphs Abs: 1.8 10*3/uL (ref 0.7–4.0)
MCHC: 31.1 g/dL (ref 30.0–36.0)
MCV: 86.4 fL (ref 78.0–100.0)
Monocytes Absolute: 1.1 10*3/uL — ABNORMAL HIGH (ref 0.1–1.0)
Monocytes Relative: 13.1 % — ABNORMAL HIGH (ref 3.0–12.0)
Neutro Abs: 5.2 10*3/uL (ref 1.4–7.7)
Neutrophils Relative %: 62.5 % (ref 43.0–77.0)
Platelets: 253 10*3/uL (ref 150.0–400.0)
RBC: 3.98 Mil/uL — ABNORMAL LOW (ref 4.22–5.81)
RDW: 16.3 % — ABNORMAL HIGH (ref 11.5–15.5)
WBC: 8.3 10*3/uL (ref 4.0–10.5)

## 2023-07-23 NOTE — Assessment & Plan Note (Signed)
Improving.   Differentials include another flare up of cholecystitis, gastroenteritis.   BMP and CBC with diff. Pending.  Consider abdominal x-ray if not better.   UC/ER precautions given and discussed with patient and wife at length.

## 2023-07-23 NOTE — Progress Notes (Signed)
Established Patient Office Visit  Subjective   Patient ID: Leon Estes, male    DOB: 1931/04/11  Age: 89 y.o. MRN: [id number]  Chief Complaint  Patient presents with   Abdominal Pain    Mainly on right side yesterday. Patient states he feels better today. No constipation or diarrhea. Wife states patient has been having issues with his stomach for about a week. Yesterday he had no appetite.     HPI  Leon Estes is a 89 year old male, patient of Dr. Alphonsus Sias, who presents today with his wife for an acute visit.   Right sided abdominal pain- started three days ago with epigatric pain which then radiated to right upper quadrant. He had nausea with vomiting for 24 hours which has now resolved. Wife reports the consistency of vomit was usually liquid with no food content and clear in color. He was not able to keep anything down including medications yesterday. No diarrhea or consitpation. This morning, he feels much better. He had breakfast with eggs and biscuit and has kept it down. His last episode of vomiting was around 4 pm yesterday. He has a history of gallstones and cholecystitis.   Patient Active Problem List   Diagnosis Date Noted   Vascular dementia without behavioral disturbance, psychotic disturbance, mood disturbance, or anxiety (HCC) 04/09/2023   Osteoarthritis of both hands 01/02/2023   Cholecystitis, unspecified 08/01/2022   Chronic combined systolic (congestive) and diastolic (congestive) heart failure (HCC) 08/01/2022   RUQ pain 07/02/2022   Gallstones 07/02/2022   NSVT (nonsustained ventricular tachycardia) (HCC)    Systolic heart failure (HCC) 10/12/2021   PAF (paroxysmal atrial fibrillation) (HCC) 10/12/2021   Reflux esophagitis 09/28/2021   Neurodermatitis 09/15/2021   Allergic rhinitis due to pollen 06/12/2021   GERD (gastroesophageal reflux disease) 05/04/2021   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) 04/07/2021   Recurrent  Clostridioides difficile diarrhea 03/14/2021   HOH (hard of hearing) 12/28/2020   Pulmonary nodule 12/06/2020   Preventative health care 10/12/2020   Macular degeneration, wet (HCC) 10/12/2020   BPH (benign prostatic hyperplasia) 01/13/2020   Neuropathy 02/18/2018   COPD (chronic obstructive pulmonary disease) (HCC)    Benign essential HTN 04/09/2017   CAD (coronary artery disease) 03/03/2015   Hyperlipidemia 03/03/2015   Past Medical History:  Diagnosis Date   Asthma    BPH (benign prostatic hypertrophy)    Cataract    Chronic renal disease, stage III (HCC)    Chronic sinusitis    COPD (chronic obstructive pulmonary disease) (HCC)    Coronary artery disease    2 stents   Gall stones    Hearing loss    DOES NOT WEAR HEARING AIDS   High cholesterol    Hypertension    Lactic acid acidosis 10/12/2021   Macular degeneration    right eye   Rash 12/04/2021   Septic shock (HCC) 10/10/2021   Past Surgical History:  Procedure Laterality Date   APPENDECTOMY     CORONARY ANGIOPLASTY WITH STENT PLACEMENT  1998   2 stents   TRANSURETHRAL RESECTION OF PROSTATE     Allergies  Allergen Reactions   Augmentin [Amoxicillin-Pot Clavulanate] Rash   Bacitracin-Polymyxin B Swelling and Rash   Benzalkonium Chloride     "Benzalkonium chloride is a quaternary ammonium antiseptic and disinfectant with actions and uses similar to those of other cationic surfactants. It is also used as an antimicrobial preservative for pharmaceutical products."   Neosporin [Neomycin-Bacitracin Zn-Polymyx] Rash  Sulfamethoxazole-Trimethoprim Rash   Terbinafine And Related Rash         07/23/2023   11:33 AM 04/09/2023   10:33 AM 12/04/2021    2:34 PM  Depression screen PHQ 2/9  Decreased Interest 1 0 0  Down, Depressed, Hopeless 0 0 0  PHQ - 2 Score 1 0 0  Altered sleeping 1    Tired, decreased energy 1    Change in appetite 1    Feeling bad or failure about yourself  0    Trouble concentrating 1     Moving slowly or fidgety/restless 1    Suicidal thoughts 0    PHQ-9 Score 6    Difficult doing work/chores Somewhat difficult         07/23/2023   11:34 AM  GAD 7 : Generalized Anxiety Score  Nervous, Anxious, on Edge 0  Control/stop worrying 0  Worry too much - different things 0  Trouble relaxing 0  Restless 0  Easily annoyed or irritable 0  Afraid - awful might happen 0  Total GAD 7 Score 0  Anxiety Difficulty Not difficult at all      Review of Systems  Constitutional:  Negative for chills and fever.  Respiratory:  Negative for cough and shortness of breath.   Cardiovascular:  Negative for chest pain.  Gastrointestinal:  Negative for abdominal pain, blood in stool, constipation, diarrhea, heartburn, nausea and vomiting.  Genitourinary:  Negative for dysuria, frequency, hematuria and urgency.  Neurological:  Negative for dizziness and headaches.      Objective:     BP 120/64 (BP Location: Left Arm, Patient Position: Sitting, Cuff Size: Normal)   Pulse 77   Temp 98.1 F (36.7 C) (Oral)   Ht 5\' 6"  (1.676 m)   Wt 141 lb (64 kg)   SpO2 94%   BMI 22.76 kg/m  BP Readings from Last 3 Encounters:  07/23/23 120/64  06/17/23 122/60  06/06/23 (!) 189/84   Wt Readings from Last 3 Encounters:  07/23/23 141 lb (64 kg)  06/17/23 146 lb (66.2 kg)  06/06/23 140 lb 10.5 oz (63.8 kg)      Physical Exam Vitals and nursing note reviewed.  Constitutional:      Appearance: Normal appearance.  Cardiovascular:     Rate and Rhythm: Normal rate and regular rhythm.     Pulses: Normal pulses.     Heart sounds: Normal heart sounds.  Pulmonary:     Effort: Pulmonary effort is normal.     Breath sounds: Normal breath sounds.  Abdominal:     General: Abdomen is flat. Bowel sounds are normal.     Palpations: Abdomen is soft.     Tenderness: There is abdominal tenderness in the right upper quadrant and epigastric area. There is no right CVA tenderness or left CVA tenderness.   Neurological:     Mental Status: He is alert and oriented to person, place, and time.  Psychiatric:        Mood and Affect: Mood normal.        Behavior: Behavior normal.        Thought Content: Thought content normal.        Judgment: Judgment normal.      No results found for any visits on 07/23/23.     The ASCVD Risk score (Arnett DK, et al., 2019) failed to calculate for the following reasons:   The 2019 ASCVD risk score is only valid for ages 28 to 15  Assessment & Plan:  RUQ pain Assessment & Plan: Improving.   Differentials include another flare up of cholecystitis, gastroenteritis.   BMP and CBC with diff. Pending.  Consider abdominal x-ray if not better.   UC/ER precautions given and discussed with patient and wife at length.   Orders: -     CBC with Differential/Platelet -     Basic metabolic panel     Return if symptoms worsen or fail to improve.    Modesto Charon, NP

## 2023-07-23 NOTE — Patient Instructions (Signed)
Stop by the lab prior to leaving today. I will notify you of your results once received.   Please update me if your symptoms return or worsen. Then we will do the x-ray.   Stay hydrated. You can drink pedialyte and gatorade.   Continue to monitor for fever, chills, unable to pass gas or burp, blood in stool, chest pain, shortness of breath - please go to the ER.   It was a pleasure meeting you!

## 2023-08-02 ENCOUNTER — Encounter: Payer: Self-pay | Admitting: Internal Medicine

## 2023-08-02 ENCOUNTER — Other Ambulatory Visit: Payer: Self-pay

## 2023-08-02 ENCOUNTER — Inpatient Hospital Stay
Admission: EM | Admit: 2023-08-02 | Discharge: 2023-08-04 | DRG: 871 | Disposition: A | Discharge status: Home Health | Payer: PPO | Attending: Obstetrics and Gynecology | Admitting: Obstetrics and Gynecology

## 2023-08-02 ENCOUNTER — Emergency Department: Payer: PPO

## 2023-08-02 DIAGNOSIS — R5381 Other malaise: Secondary | ICD-10-CM | POA: Diagnosis not present

## 2023-08-02 DIAGNOSIS — A4189 Other specified sepsis: Secondary | ICD-10-CM | POA: Diagnosis not present

## 2023-08-02 DIAGNOSIS — J1001 Influenza due to other identified influenza virus with the same other identified influenza virus pneumonia: Secondary | ICD-10-CM | POA: Diagnosis present

## 2023-08-02 DIAGNOSIS — D631 Anemia in chronic kidney disease: Secondary | ICD-10-CM | POA: Diagnosis not present

## 2023-08-02 DIAGNOSIS — E7800 Pure hypercholesterolemia, unspecified: Secondary | ICD-10-CM | POA: Diagnosis present

## 2023-08-02 DIAGNOSIS — H918X3 Other specified hearing loss, bilateral: Secondary | ICD-10-CM | POA: Diagnosis present

## 2023-08-02 DIAGNOSIS — I5042 Chronic combined systolic (congestive) and diastolic (congestive) heart failure: Secondary | ICD-10-CM | POA: Diagnosis present

## 2023-08-02 DIAGNOSIS — J111 Influenza due to unidentified influenza virus with other respiratory manifestations: Secondary | ICD-10-CM | POA: Diagnosis not present

## 2023-08-02 DIAGNOSIS — Z9079 Acquired absence of other genital organ(s): Secondary | ICD-10-CM

## 2023-08-02 DIAGNOSIS — H3530 Unspecified macular degeneration: Secondary | ICD-10-CM | POA: Diagnosis not present

## 2023-08-02 DIAGNOSIS — Z8249 Family history of ischemic heart disease and other diseases of the circulatory system: Secondary | ICD-10-CM | POA: Diagnosis not present

## 2023-08-02 DIAGNOSIS — Z955 Presence of coronary angioplasty implant and graft: Secondary | ICD-10-CM | POA: Diagnosis not present

## 2023-08-02 DIAGNOSIS — I959 Hypotension, unspecified: Secondary | ICD-10-CM | POA: Diagnosis not present

## 2023-08-02 DIAGNOSIS — I2510 Atherosclerotic heart disease of native coronary artery without angina pectoris: Secondary | ICD-10-CM | POA: Diagnosis present

## 2023-08-02 DIAGNOSIS — J09X1 Influenza due to identified novel influenza A virus with pneumonia: Secondary | ICD-10-CM | POA: Insufficient documentation

## 2023-08-02 DIAGNOSIS — N400 Enlarged prostate without lower urinary tract symptoms: Secondary | ICD-10-CM | POA: Diagnosis not present

## 2023-08-02 DIAGNOSIS — Z1152 Encounter for screening for COVID-19: Secondary | ICD-10-CM

## 2023-08-02 DIAGNOSIS — Z7982 Long term (current) use of aspirin: Secondary | ICD-10-CM

## 2023-08-02 DIAGNOSIS — Z883 Allergy status to other anti-infective agents status: Secondary | ICD-10-CM

## 2023-08-02 DIAGNOSIS — Z66 Do not resuscitate: Secondary | ICD-10-CM | POA: Diagnosis present

## 2023-08-02 DIAGNOSIS — J329 Chronic sinusitis, unspecified: Secondary | ICD-10-CM | POA: Diagnosis not present

## 2023-08-02 DIAGNOSIS — I480 Paroxysmal atrial fibrillation: Secondary | ICD-10-CM | POA: Diagnosis not present

## 2023-08-02 DIAGNOSIS — Z87891 Personal history of nicotine dependence: Secondary | ICD-10-CM

## 2023-08-02 DIAGNOSIS — J441 Chronic obstructive pulmonary disease with (acute) exacerbation: Secondary | ICD-10-CM | POA: Diagnosis present

## 2023-08-02 DIAGNOSIS — N1831 Chronic kidney disease, stage 3a: Secondary | ICD-10-CM | POA: Diagnosis not present

## 2023-08-02 DIAGNOSIS — J9601 Acute respiratory failure with hypoxia: Secondary | ICD-10-CM | POA: Diagnosis present

## 2023-08-02 DIAGNOSIS — Z881 Allergy status to other antibiotic agents status: Secondary | ICD-10-CM

## 2023-08-02 DIAGNOSIS — Z7951 Long term (current) use of inhaled steroids: Secondary | ICD-10-CM

## 2023-08-02 DIAGNOSIS — R0902 Hypoxemia: Secondary | ICD-10-CM | POA: Diagnosis not present

## 2023-08-02 DIAGNOSIS — J449 Chronic obstructive pulmonary disease, unspecified: Secondary | ICD-10-CM | POA: Diagnosis present

## 2023-08-02 DIAGNOSIS — Z888 Allergy status to other drugs, medicaments and biological substances status: Secondary | ICD-10-CM

## 2023-08-02 DIAGNOSIS — I130 Hypertensive heart and chronic kidney disease with heart failure and stage 1 through stage 4 chronic kidney disease, or unspecified chronic kidney disease: Secondary | ICD-10-CM | POA: Diagnosis present

## 2023-08-02 DIAGNOSIS — Z882 Allergy status to sulfonamides status: Secondary | ICD-10-CM

## 2023-08-02 DIAGNOSIS — I4510 Unspecified right bundle-branch block: Secondary | ICD-10-CM | POA: Diagnosis not present

## 2023-08-02 DIAGNOSIS — R0602 Shortness of breath: Secondary | ICD-10-CM | POA: Diagnosis not present

## 2023-08-02 DIAGNOSIS — I10 Essential (primary) hypertension: Secondary | ICD-10-CM | POA: Diagnosis present

## 2023-08-02 DIAGNOSIS — Z79899 Other long term (current) drug therapy: Secondary | ICD-10-CM

## 2023-08-02 DIAGNOSIS — J101 Influenza due to other identified influenza virus with other respiratory manifestations: Secondary | ICD-10-CM | POA: Diagnosis not present

## 2023-08-02 DIAGNOSIS — R509 Fever, unspecified: Secondary | ICD-10-CM | POA: Diagnosis not present

## 2023-08-02 DIAGNOSIS — I25119 Atherosclerotic heart disease of native coronary artery with unspecified angina pectoris: Secondary | ICD-10-CM | POA: Diagnosis present

## 2023-08-02 DIAGNOSIS — R0689 Other abnormalities of breathing: Secondary | ICD-10-CM | POA: Diagnosis not present

## 2023-08-02 LAB — CBC WITH DIFFERENTIAL/PLATELET
Abs Immature Granulocytes: 0.04 10*3/uL (ref 0.00–0.07)
Basophils Absolute: 0.1 10*3/uL (ref 0.0–0.1)
Basophils Relative: 1 %
Eosinophils Absolute: 0.2 10*3/uL (ref 0.0–0.5)
Eosinophils Relative: 3 %
HCT: 33.1 % — ABNORMAL LOW (ref 39.0–52.0)
Hemoglobin: 10.3 g/dL — ABNORMAL LOW (ref 13.0–17.0)
Immature Granulocytes: 1 %
Lymphocytes Relative: 8 %
Lymphs Abs: 0.5 10*3/uL — ABNORMAL LOW (ref 0.7–4.0)
MCH: 26.5 pg (ref 26.0–34.0)
MCHC: 31.1 g/dL (ref 30.0–36.0)
MCV: 85.3 fL (ref 80.0–100.0)
Monocytes Absolute: 0.8 10*3/uL (ref 0.1–1.0)
Monocytes Relative: 13 %
Neutro Abs: 4.4 10*3/uL (ref 1.7–7.7)
Neutrophils Relative %: 74 %
Platelets: 216 10*3/uL (ref 150–400)
RBC: 3.88 MIL/uL — ABNORMAL LOW (ref 4.22–5.81)
RDW: 15 % (ref 11.5–15.5)
WBC: 5.9 10*3/uL (ref 4.0–10.5)
nRBC: 0 % (ref 0.0–0.2)

## 2023-08-02 LAB — LACTIC ACID, PLASMA
Lactic Acid, Venous: 1.2 mmol/L (ref 0.5–1.9)
Lactic Acid, Venous: 1.4 mmol/L (ref 0.5–1.9)

## 2023-08-02 LAB — URINALYSIS, ROUTINE W REFLEX MICROSCOPIC
Bacteria, UA: NONE SEEN
Bilirubin Urine: NEGATIVE
Glucose, UA: NEGATIVE mg/dL
Ketones, ur: NEGATIVE mg/dL
Leukocytes,Ua: NEGATIVE
Nitrite: NEGATIVE
Protein, ur: 30 mg/dL — AB
Specific Gravity, Urine: 1.017 (ref 1.005–1.030)
pH: 6 (ref 5.0–8.0)

## 2023-08-02 LAB — COMPREHENSIVE METABOLIC PANEL
ALT: 11 U/L (ref 0–44)
AST: 19 U/L (ref 15–41)
Albumin: 3.7 g/dL (ref 3.5–5.0)
Alkaline Phosphatase: 89 U/L (ref 38–126)
Anion gap: 8 (ref 5–15)
BUN: 10 mg/dL (ref 8–23)
CO2: 27 mmol/L (ref 22–32)
Calcium: 8.5 mg/dL — ABNORMAL LOW (ref 8.9–10.3)
Chloride: 102 mmol/L (ref 98–111)
Creatinine, Ser: 1.15 mg/dL (ref 0.61–1.24)
GFR, Estimated: >60 mL/min (ref >60)
Glucose, Bld: 98 mg/dL (ref 70–99)
Potassium: 3.4 mmol/L — ABNORMAL LOW (ref 3.5–5.1)
Sodium: 137 mmol/L (ref 135–145)
Total Bilirubin: 0.7 mg/dL (ref <1.2)
Total Protein: 6.9 g/dL (ref 6.5–8.1)

## 2023-08-02 LAB — PROCALCITONIN: Procalcitonin: <0.1 ng/mL

## 2023-08-02 LAB — RESP PANEL BY RT-PCR (RSV, FLU A&B, COVID)  RVPGX2
Influenza A by PCR: POSITIVE — AB
Influenza B by PCR: NEGATIVE
Resp Syncytial Virus by PCR: NEGATIVE
SARS Coronavirus 2 by RT PCR: NEGATIVE

## 2023-08-02 LAB — TROPONIN I (HIGH SENSITIVITY)
Troponin I (High Sensitivity): 16 ng/L (ref <18)
Troponin I (High Sensitivity): 18 ng/L — ABNORMAL HIGH (ref <18)

## 2023-08-02 MED ORDER — BUDESONIDE 0.5 MG/2ML IN SUSP
0.5000 mg | Freq: Two times a day (BID) | RESPIRATORY_TRACT | Status: DC
  Administered 2023-08-02 – 2023-08-03 (×4): 0.5 mg via RESPIRATORY_TRACT
  Filled 2023-08-02 (×5): qty 2

## 2023-08-02 MED ORDER — GUAIFENESIN ER 600 MG PO TB12
600.0000 mg | ORAL_TABLET | Freq: Two times a day (BID) | ORAL | Status: DC
  Administered 2023-08-02 – 2023-08-04 (×5): 600 mg via ORAL
  Filled 2023-08-02 (×5): qty 1

## 2023-08-02 MED ORDER — IPRATROPIUM-ALBUTEROL 0.5-2.5 (3) MG/3ML IN SOLN
3.0000 mL | Freq: Four times a day (QID) | RESPIRATORY_TRACT | Status: DC
Start: 2023-08-02 — End: 2023-08-03
  Administered 2023-08-02 (×2): 3 mL via RESPIRATORY_TRACT
  Filled 2023-08-02 (×2): qty 3

## 2023-08-02 MED ORDER — QUETIAPINE FUMARATE 25 MG PO TABS
25.0000 mg | ORAL_TABLET | Freq: Every day | ORAL | Status: DC
  Administered 2023-08-02 – 2023-08-03 (×2): 25 mg via ORAL
  Filled 2023-08-02 (×2): qty 1

## 2023-08-02 MED ORDER — CARVEDILOL 3.125 MG PO TABS
3.1250 mg | ORAL_TABLET | Freq: Two times a day (BID) | ORAL | Status: DC
  Administered 2023-08-02 – 2023-08-04 (×4): 3.125 mg via ORAL
  Filled 2023-08-02 (×4): qty 1

## 2023-08-02 MED ORDER — MAGNESIUM SULFATE IN D5W 1-5 GM/100ML-% IV SOLN
1.0000 g | Freq: Once | INTRAVENOUS | Status: AC
  Administered 2023-08-02: 1 g via INTRAVENOUS
  Filled 2023-08-02: qty 100

## 2023-08-02 MED ORDER — ALBUTEROL SULFATE (2.5 MG/3ML) 0.083% IN NEBU
2.5000 mg | INHALATION_SOLUTION | Freq: Four times a day (QID) | RESPIRATORY_TRACT | Status: DC | PRN
  Filled 2023-08-02: qty 3

## 2023-08-02 MED ORDER — PANTOPRAZOLE SODIUM 40 MG PO TBEC
40.0000 mg | DELAYED_RELEASE_TABLET | Freq: Every day | ORAL | Status: DC
  Administered 2023-08-02 – 2023-08-04 (×3): 40 mg via ORAL
  Filled 2023-08-02 (×3): qty 1

## 2023-08-02 MED ORDER — TRAMADOL HCL 50 MG PO TABS
50.0000 mg | ORAL_TABLET | Freq: Every day | ORAL | Status: DC | PRN
  Administered 2023-08-04: 50 mg via ORAL
  Filled 2023-08-02: qty 1

## 2023-08-02 MED ORDER — HYDRALAZINE HCL 20 MG/ML IJ SOLN
5.0000 mg | Freq: Four times a day (QID) | INTRAMUSCULAR | Status: DC | PRN
  Administered 2023-08-04: 5 mg via INTRAVENOUS
  Filled 2023-08-02: qty 1

## 2023-08-02 MED ORDER — OSELTAMIVIR PHOSPHATE 30 MG PO CAPS
30.0000 mg | ORAL_CAPSULE | Freq: Two times a day (BID) | ORAL | Status: DC
  Administered 2023-08-02 – 2023-08-04 (×5): 30 mg via ORAL
  Filled 2023-08-02 (×6): qty 1

## 2023-08-02 MED ORDER — METHYLPREDNISOLONE SODIUM SUCC 125 MG IJ SOLR
120.0000 mg | INTRAMUSCULAR | Status: DC
  Administered 2023-08-02: 120 mg via INTRAVENOUS
  Filled 2023-08-02: qty 2

## 2023-08-02 MED ORDER — SODIUM CHLORIDE 0.9 % IV SOLN
500.0000 mg | INTRAVENOUS | Status: DC
  Administered 2023-08-02: 500 mg via INTRAVENOUS
  Filled 2023-08-02: qty 5

## 2023-08-02 MED ORDER — TAMSULOSIN HCL 0.4 MG PO CAPS
0.4000 mg | ORAL_CAPSULE | Freq: Every day | ORAL | Status: DC
  Administered 2023-08-02 – 2023-08-03 (×2): 0.4 mg via ORAL
  Filled 2023-08-02 (×2): qty 1

## 2023-08-02 MED ORDER — POTASSIUM CHLORIDE CRYS ER 10 MEQ PO TBCR
10.0000 meq | EXTENDED_RELEASE_TABLET | Freq: Every day | ORAL | Status: DC
  Administered 2023-08-02 – 2023-08-04 (×3): 10 meq via ORAL
  Filled 2023-08-02 (×3): qty 1

## 2023-08-02 MED ORDER — GABAPENTIN 100 MG PO CAPS: 400.0000 mg | ORAL_CAPSULE | Freq: Every day | ORAL | Status: DC

## 2023-08-02 MED ORDER — POTASSIUM CHLORIDE CRYS ER 20 MEQ PO TBCR
40.0000 meq | EXTENDED_RELEASE_TABLET | Freq: Once | ORAL | Status: AC
  Administered 2023-08-02: 40 meq via ORAL
  Filled 2023-08-02: qty 2

## 2023-08-02 MED ORDER — ENOXAPARIN SODIUM 40 MG/0.4ML IJ SOSY
40.0000 mg | PREFILLED_SYRINGE | INTRAMUSCULAR | Status: DC
  Administered 2023-08-02 – 2023-08-03 (×2): 40 mg via SUBCUTANEOUS
  Filled 2023-08-02 (×2): qty 0.4

## 2023-08-02 MED ORDER — SODIUM CHLORIDE 0.9 % IV SOLN
2.0000 g | INTRAVENOUS | Status: DC
  Administered 2023-08-02: 2 g via INTRAVENOUS
  Filled 2023-08-02: qty 20

## 2023-08-02 MED ORDER — ACETAMINOPHEN 500 MG PO TABS
1000.0000 mg | ORAL_TABLET | Freq: Four times a day (QID) | ORAL | Status: DC | PRN
  Administered 2023-08-02 – 2023-08-03 (×3): 1000 mg via ORAL
  Filled 2023-08-02 (×3): qty 2

## 2023-08-02 MED ORDER — ASPIRIN 81 MG PO TBEC
81.0000 mg | DELAYED_RELEASE_TABLET | Freq: Every day | ORAL | Status: DC
  Administered 2023-08-02 – 2023-08-04 (×3): 81 mg via ORAL
  Filled 2023-08-02 (×3): qty 1

## 2023-08-02 MED ORDER — GABAPENTIN 400 MG PO CAPS
400.0000 mg | ORAL_CAPSULE | Freq: Every day | ORAL | Status: DC
  Administered 2023-08-02 – 2023-08-03 (×2): 400 mg via ORAL
  Filled 2023-08-02 (×2): qty 1

## 2023-08-02 MED ORDER — SIMVASTATIN 20 MG PO TABS
40.0000 mg | ORAL_TABLET | Freq: Every day | ORAL | Status: DC
  Administered 2023-08-02 – 2023-08-03 (×2): 40 mg via ORAL
  Filled 2023-08-02 (×2): qty 2

## 2023-08-02 MED ORDER — METHYLPREDNISOLONE SODIUM SUCC 125 MG IJ SOLR
125.0000 mg | Freq: Once | INTRAMUSCULAR | Status: AC
  Administered 2023-08-02: 125 mg via INTRAVENOUS
  Filled 2023-08-02: qty 2

## 2023-08-02 MED ORDER — SUCRALFATE 1 G PO TABS
1.0000 g | ORAL_TABLET | Freq: Three times a day (TID) | ORAL | Status: DC
  Administered 2023-08-02 – 2023-08-04 (×8): 1 g via ORAL
  Filled 2023-08-02 (×8): qty 1

## 2023-08-02 NOTE — ED Triage Notes (Signed)
Pt bib AMES from home c/o fever. EMS reports a fever of 105.6 temporal temp.  Pt is axo upon assessment. Pt is on 2L [place] sat at 94%. Pt does not wear oxygen at baseline. 88% room air. Pt may have been exposed to flu by a family member. 140/68 94 HR axo

## 2023-08-02 NOTE — Sepsis Progress Note (Signed)
eLink is following this Code Sepsis. °

## 2023-08-02 NOTE — ED Notes (Signed)
Wife at bedside.

## 2023-08-02 NOTE — ED Notes (Signed)
This nurse provided pericare and applied a new brief and gown after pt urinated himself. Pt tolerated well. Wife at bedside. NAD

## 2023-08-02 NOTE — ED Notes (Signed)
Pt repositioned by this nurse and Tanya Nones, MD. Pt is 2 person assist and wobbly when standing. Pt tolerated well. Call bell within reach and pt informed to use this device when needing to be repositioned or needs assistance. Pt verbalized understanding.

## 2023-08-02 NOTE — Progress Notes (Signed)
CODE SEPSIS - PHARMACY COMMUNICATION  **Broad Spectrum Antibiotics should be administered within 1 hour of Sepsis diagnosis**  Time Code Sepsis Called/Page Received: 0930  Antibiotics Ordered: Ceftriaxone and Azithromycin  Time of 1st antibiotic administration: 0940  Additional action taken by pharmacy: None  If necessary, Name of Provider/Nurse Contacted: None    Rockwell Alexandria ,PharmD Clinical Pharmacist  08/02/2023  9:57 AM

## 2023-08-02 NOTE — ED Notes (Signed)
Pt to xr 

## 2023-08-02 NOTE — ED Provider Notes (Signed)
[facility] Provider Note    Event Date/Time   First MD Initiated Contact with Patient 08/02/23 [phone]     (approximate)   History   Chief Complaint Fever   HPI  Leon Estes is a 89 y.o. male with past medical history of hyperlipidemia, hypertension, CAD, CHF, atrial fibrillation, and COPD who presents to the ED complaining of fever.  Per EMS, patient's wife checked his temperature earlier this morning with a noncontact thermometer, found patient to have fever of 105.  She attempted to give him Tylenol but patient had a hard time swallowing this and EMS was contacted.  Patient reports feeling slightly weak but otherwise denies any complaints, is unsure how long he may have had a fever for.  He specifically denies any cough, chest pain, shortness of breath, nausea, vomiting, diarrhea, or dysuria.  EMS was apparently told by patient's wife that he recently came in contact with someone with influenza.     Physical Exam   Triage Vital Signs: ED Triage Vitals  Encounter Vitals Group     BP      Systolic BP Percentile      Diastolic BP Percentile      Pulse      Resp      Temp      Temp src      SpO2      Weight      Height      Head Circumference      Peak Flow      Pain Score      Pain Loc      Pain Education      Exclude from Growth Chart     Most recent vital signs: Vitals:   08/02/23 0858 08/02/23 1030  BP: (!) 160/74 (!) 150/65  Pulse: 94 89  Resp: (!) 32 (!) 23  Temp: 100 F (37.8 C)   SpO2: 97% 100%    Constitutional: Alert and oriented. Eyes: Conjunctivae are normal. Head: Atraumatic. Nose: No congestion/rhinnorhea. Mouth/Throat: Mucous membranes are moist.  Neck: Supple with no meningismus. Cardiovascular: Normal rate, regular rhythm. Grossly normal heart sounds.  2+ radial pulses bilaterally. Respiratory: Tachypneic with normal respiratory effort.  No retractions. Lungs CTAB. Gastrointestinal: Soft and nontender. No  distention. Musculoskeletal: No lower extremity tenderness nor edema.  Neurologic:  Normal speech and language. No gross focal neurologic deficits are appreciated.    ED Results / Procedures / Treatments   Labs (all labs ordered are listed, but only abnormal results are displayed) Labs Reviewed  RESP PANEL BY RT-PCR (RSV, FLU A&B, COVID)  RVPGX2 - Abnormal; Notable for the following components:      Result Value   Influenza A by PCR POSITIVE (*)    All other components within normal limits  CBC WITH DIFFERENTIAL/PLATELET - Abnormal; Notable for the following components:   RBC 3.88 (*)    Hemoglobin 10.3 (*)    HCT 33.1 (*)    Lymphs Abs 0.5 (*)    All other components within normal limits  COMPREHENSIVE METABOLIC PANEL - Abnormal; Notable for the following components:   Potassium 3.4 (*)    Calcium 8.5 (*)    All other components within normal limits  URINALYSIS, ROUTINE W REFLEX MICROSCOPIC - Abnormal; Notable for the following components:   Color, Urine YELLOW (*)    APPearance CLEAR (*)    Hgb urine dipstick SMALL (*)    Protein, ur 30 (*)    All other  components within normal limits  CULTURE, BLOOD (ROUTINE X 2)  CULTURE, BLOOD (ROUTINE X 2)  LACTIC ACID, PLASMA  LACTIC ACID, PLASMA  PROCALCITONIN  TROPONIN I (HIGH SENSITIVITY)  TROPONIN I (HIGH SENSITIVITY)     EKG  ED ECG REPORT I, Chesley Noon, the attending physician, personally viewed and interpreted this ECG.   Date: 08/02/2023  EKG Time: 9:23  Rate: 88  Rhythm: normal sinus rhythm  Axis: Normal  Intervals:right bundle branch block  ST&T Change: None  RADIOLOGY Chest x-ray reviewed and interpreted by me with no infiltrate, edema, or effusion.  PROCEDURES:  Critical Care performed: Yes, see critical care procedure note(s)  .Critical Care  Performed by: Chesley Noon, MD Authorized by: Chesley Noon, MD   Critical care provider statement:    Critical care time (minutes):  30   Critical  care time was exclusive of:  Separately billable procedures and treating other patients and teaching time   Critical care was necessary to treat or prevent imminent or life-threatening deterioration of the following conditions:  Respiratory failure and sepsis   Critical care was time spent personally by me on the following activities:  Development of treatment plan with patient or surrogate, discussions with consultants, evaluation of patient's response to treatment, examination of patient, ordering and review of laboratory studies, ordering and review of radiographic studies, ordering and performing treatments and interventions, pulse oximetry, re-evaluation of patient's condition and review of old charts   I assumed direction of critical care for this patient from another provider in my specialty: no     Care discussed with: admitting provider      MEDICATIONS ORDERED IN ED: Medications  cefTRIAXone (ROCEPHIN) 2 g in sodium chloride 0.9 % 100 mL IVPB (0 g Intravenous Stopped 08/02/23 1057)  azithromycin (ZITHROMAX) 500 mg in sodium chloride 0.9 % 250 mL IVPB (0 mg Intravenous Stopped 08/02/23 1057)     IMPRESSION / MDM / ASSESSMENT AND PLAN / ED COURSE  I reviewed the triage vital signs and the nursing notes.                              89 y.o. male with past medical history of hypertension, hyperlipidemia, CAD, CHF, COPD, and atrial fibrillation who presents to the ED with fever noted this morning by his wife.  Patient's presentation is most consistent with acute presentation with potential threat to life or bodily function.  Differential diagnosis includes, but is not limited to, sepsis, pneumonia, bronchitis, influenza, COVID-19, anemia, electrolyte abnormality, AKI, UTI.  Patient nontoxic-appearing and in no acute distress, vital signs remarkable for borderline fever at 100.0 as well as tachypnea.  Patient with oxygen saturations of 88% on room air, improved on 2 L nasal cannula.   Presentation concerning for sepsis, chest x-ray unremarkable, urinalysis and viral testing is pending.  EKG shows no evidence of arrhythmia or ischemia, labs are pending at this time.  Chest x-ray is unremarkable, patient did test positive for the flu which is likely the source of his fever and respiratory failure. Labs without significant anemia, leukocytosis, electrolyte abnormality, or AKI.  LFTs are unremarkable, troponin and lactic acid within normal limits.  Case discussed with hospitalist for admission.     FINAL CLINICAL IMPRESSION(S) / ED DIAGNOSES   Final diagnoses:  Influenza  Acute respiratory failure with hypoxia (HCC)     Rx / DC Orders   ED Discharge Orders     None  Note:  This document was prepared using Dragon voice recognition software and may include unintentional dictation errors.   Chesley Noon, MD 08/02/23 1140

## 2023-08-02 NOTE — Plan of Care (Signed)
  Problem: Education: Goal: Knowledge of General Education information will improve Description: Including pain rating scale, medication(s)/side effects and non-pharmacologic comfort measures Outcome: Progressing   Problem: Nutrition: Goal: Adequate nutrition will be maintained Outcome: Progressing   Problem: Pain Management: Goal: General experience of comfort will improve Outcome: Progressing   Problem: Safety: Goal: Ability to remain free from injury will improve Outcome: Progressing   Problem: Skin Integrity: Goal: Risk for impaired skin integrity will decrease Outcome: Progressing

## 2023-08-02 NOTE — ED Notes (Signed)
This nurse called RT to verify med comparability between the Pulmicort and Duoneb nebulizer. RT approved using together.

## 2023-08-02 NOTE — ED Notes (Signed)
This nurse called Wilfred Lacy (Wife) for a update ( with pt permission) and gave her a update on pt status. Pt was given phone and able to talk to wife.

## 2023-08-02 NOTE — Evaluation (Signed)
Occupational Therapy Evaluation Patient Details Name: Leon Estes MRN: [id number] DOB: 01-08-1931 Today's Date: 08/02/2023   History of Present Illness Leon Estes is a 89 y.o. male with medical history significant of chronic combined HFrEF and HFpEF, COPD, PAF not on anticoagulation due to severe anemia, CAD status post remote PCI's, CKD stage IIIa, HTN, HLD, presented with new onset of cough wheezing shortness of breath.     Symptoms started 2 days ago patient started develop dry cough wheezing and shortness of breath, denies any chest pain no fever or chills.  Symptoms became severe overnight and decided to come to the hospital.  Denied any GI symptoms no nauseous vomiting or diarrhea.  Denied any dysuria.   Clinical Impression   Patient received for OT evaluation. See flowsheet below for details of function. Generally, patient requiring MIN A for bed mobility, MIN A handheld assist for sidestepping only, and MOD-MAX A for ADLs. Patient will benefit from continued OT while in acute care.    If plan is discharge home, recommend the following: A lot of help with walking and/or transfers;A lot of help with bathing/dressing/bathroom;Assistance with cooking/housework;Direct supervision/assist for medications management;Direct supervision/assist for financial management;Assist for transportation;Help with stairs or ramp for entrance    Functional Status Assessment  Patient has had a recent decline in their functional status and demonstrates the ability to make significant improvements in function in a reasonable and predictable amount of time.  Equipment Recommendations  None recommended by OT    Recommendations for Other Services       Precautions / Restrictions Precautions Precautions: Fall Restrictions Weight Bearing Restrictions Per Provider Order: No      Mobility Bed Mobility Overal bed mobility: Needs Assistance Bed Mobility: Supine to Sit, Sit to Supine     Supine  to sit: Min assist Sit to supine: Contact guard assist   General bed mobility comments: with extra time    Transfers Overall transfer level: Needs assistance Equipment used: 1 person hand held assist Transfers: Sit to/from Stand Sit to Stand: Min assist           General transfer comment: took 3 sidesteps to the R; feeling weak and off-balance.      Balance Overall balance assessment: Needs assistance Sitting-balance support: Feet supported Sitting balance-Leahy Scale: Good     Standing balance support: Single extremity supported Standing balance-Leahy Scale: Poor                             ADL either performed or assessed with clinical judgement   ADL Overall ADL's : Needs assistance/impaired                     Lower Body Dressing: Maximal assistance;Bed level Lower Body Dressing Details (indicate cue type and reason): dependent for donning BIL socks; pt tried, but unable.             Functional mobility during ADLs:  (handheld assist for sidesteps to R) General ADL Comments: Decreased activity tolerance; pt feeling cold throughout session 2/2 fever.     Vision Baseline Vision/History: 6 Macular Degeneration Patient Visual Report: No change from baseline       Perception         Praxis         Pertinent Vitals/Pain Pain Assessment Pain Assessment: No/denies pain     Extremity/Trunk Assessment Upper Extremity Assessment Upper Extremity Assessment: Generalized weakness   Lower Extremity  Assessment Lower Extremity Assessment: Generalized weakness (BIL neuropathy in feet; wife states she wraps his toes nightly; pt states he is able to feel his feet on the ground)   Cervical / Trunk Assessment Cervical / Trunk Assessment: Normal   Communication Communication Communication: Hearing impairment   Cognition Arousal: Alert Behavior During Therapy: [facility] for tasks assessed/performed Overall Cognitive Status: History of cognitive  impairments - at baseline                                 General Comments: Pt is oriented to self, wife, hospital, but not to date (thinks today is Thursday- it is Friday- and that it is July 25, 2022). Does have dx of vascular dementia.     General Comments  Pt on 2L O2 when OT arrived, but nasal cannula not properly situated in nose and saturation 92-94%; OT removed O2 and pt saturation remained stable. RN notified of pt's wifes request for mechanical soft diet, as pt does not have teeth and has difficulty chewing.    Exercises     Shoulder Instructions      Home Living Family/patient expects to be discharged to:: Private residence Living Arrangements: Spouse/significant other Available Help at Discharge: Family;Available 24 hours/day (wife) Type of Home: House Home Access: Stairs to enter [employer] of Steps: 3 Entrance Stairs-Rails:  (unilateral rail) Home Layout: One level     Bathroom Shower/Tub: Producer, television/film/video: Handicapped height     Home Equipment: Rollator (4 wheels);BSC/3in1;Transport chair;Grab bars - tub/shower   Additional Comments: Pt and wife have been married for 72 years.      Prior Functioning/Environment Prior Level of Function : Needs assist (assist for driving, higher level IADLs)             Mobility Comments: typically walks without AD. Has had falls, but none recently, per wife. ADLs Comments: (I) BADLs, although wife supervises shower for safety. Pt drives golf cart on property to go [place]; does not drive a car in the community. Typically washes dishes and carries laundry basket out to clothesline. Goes to bowling alley with friends; no longer bowls, but socializes.        OT Problem List: Decreased strength;Decreased activity tolerance;Impaired balance (sitting and/or standing);Decreased safety awareness      OT Treatment/Interventions: Self-care/ADL  training;Therapeutic exercise;Energy conservation;Therapeutic activities;Patient/family education    OT Goals(Current goals can be found in the care plan section) Acute Rehab OT Goals Patient Stated Goal: Get better OT Goal Formulation: With patient Time For Goal Achievement: 08/16/23 Potential to Achieve Goals: Good ADL Goals Pt Will Perform Grooming: with modified independence;standing Pt Will Perform Lower Body Dressing: with modified independence;sit to/from stand Pt Will Transfer to Toilet: with modified independence;ambulating;regular height toilet Pt Will Perform Toileting - Clothing Manipulation and hygiene: with modified independence;sit to/from stand  OT Frequency: Min 1X/week    Co-evaluation              AM-PAC OT "6 Clicks" Daily Activity     Outcome Measure Help from another person eating meals?: A Little Help from another person taking care of personal grooming?: A Little Help from another person toileting, which includes using toliet, bedpan, or urinal?: A Lot Help from another person bathing (including washing, rinsing, drying)?: A Little Help from another person to put on and taking off regular upper body clothing?: A Little Help from another  person to put on and taking off regular lower body clothing?: A Lot 6 Click Score: 16   End of Session Nurse Communication: Mobility status  Activity Tolerance: Patient limited by fatigue Patient left: in bed;with call bell/phone within reach;with family/visitor present  OT Visit Diagnosis: Unsteadiness on feet (R26.81);Muscle weakness (generalized) (M62.81)                Time: 7425-9563 OT Time Calculation (min): 33 min Charges:  OT General Charges $OT Visit: 1 Visit OT Evaluation $OT Eval Moderate Complexity: 1 Mod OT Treatments $Self Care/Home Management : 8-22 mins  Linward Foster, MS, OTR/L  Alvester Morin 08/02/2023, 4:03 PM

## 2023-08-02 NOTE — H&P (Addendum)
History and Physical    Leon Estes NWG:[id number] DOB: 12-24-1930 DOA: 08/02/2023  PCP: Karie Schwalbe, MD (Confirm with patient/family/NH records and if not entered, this has to be entered at [facility] point of entry) Patient coming from: Home  I have personally briefly reviewed patient's old medical records in [facility]  Chief Complaint: Cough, wheezing, SOB  HPI: Leon Estes is a 89 y.o. male with medical history significant of chronic combined HFrEF and HFpEF, COPD, PAF not on anticoagulation due to severe anemia, CAD status post remote PCI's, CKD stage IIIa, HTN, HLD, presented with new onset of cough wheezing shortness of breath.  Symptoms started 2 days ago patient started develop dry cough wheezing and shortness of breath, denies any chest pain no fever or chills.  Symptoms became severe overnight and decided to come to the hospital.  Denied any GI symptoms no nauseous vomiting or diarrhea.  Denied any dysuria.  ED Course: Fever 100.4, none tachycardia, positive for tachypnea blood pressure 150/60 O2 saturation 97% on 2 L.  Chest x-ray negative for acute infiltrates, blood work showed WBC 5.9 with normal differential, creatinine 1.1 bicarb 27K 3.4, hemoglobin 10.3.  Nasal swab positive for influenza A infection.  Patient was given ceftriaxone and azithromycin in the ED.  Review of Systems: As per HPI otherwise 14 point review of systems negative.    Past Medical History:  Diagnosis Date   Asthma    BPH (benign prostatic hypertrophy)    Cataract    Chronic renal disease, stage III (HCC)    Chronic sinusitis    COPD (chronic obstructive pulmonary disease) (HCC)    Coronary artery disease    2 stents   Gall stones    Hearing loss    DOES NOT WEAR HEARING AIDS   High cholesterol    Hypertension    Lactic acid acidosis 10/12/2021   Macular degeneration    right eye   Rash 12/04/2021   Septic shock (HCC) 10/10/2021    Past Surgical History:  Procedure  Laterality Date   APPENDECTOMY     CORONARY ANGIOPLASTY WITH STENT PLACEMENT  1998   2 stents   TRANSURETHRAL RESECTION OF PROSTATE       reports that he quit smoking about 17 years ago. His smoking use included cigarettes. He has been exposed to tobacco smoke. His smokeless tobacco use includes chew. He reports that he does not drink alcohol and does not use drugs.  Allergies  Allergen Reactions   Augmentin [Amoxicillin-Pot Clavulanate] Rash   Bacitracin-Polymyxin B Swelling and Rash   Benzalkonium Chloride     "Benzalkonium chloride is a quaternary ammonium antiseptic and disinfectant with actions and uses similar to those of other cationic surfactants. It is also used as an antimicrobial preservative for pharmaceutical products."   Neosporin [Neomycin-Bacitracin Zn-Polymyx] Rash   Sulfamethoxazole-Trimethoprim Rash   Terbinafine And Related Rash    Family History  Problem Relation Age of Onset   Diabetes Father    Heart disease Brother      Prior to Admission medications   Medication Sig Start Date End Date Taking? Authorizing Provider  budesonide (PULMICORT) 0.5 MG/2ML nebulizer solution Take 2 mLs (0.5 mg total) by nebulization in the morning and at bedtime. 08/24/22  Yes Karie Schwalbe, MD  gabapentin (NEURONTIN) 400 MG capsule Take 1-2 capsules (400-800 mg total) by mouth at bedtime. 04/09/23  Yes Tillman Abide I, MD  ipratropium-albuterol (DUONEB) 0.5-2.5 (3) MG/3ML SOLN Take 3 ml by nebulization  3-4 times daily as needed 08/24/22  Yes Karie Schwalbe, MD  losartan (COZAAR) 25 MG tablet TAKE ONE HALF (1/2) TABLET BY MOUTH DAILY 10/23/22  Yes Nahser, Deloris Amany Rando, MD  potassium chloride (KLOR-CON) 10 MEQ tablet TAKE ONE TABLET BY MOUTH ONCE A DAY 11/20/22  Yes Dyann Kief, PA-C  QUEtiapine (SEROQUEL) 25 MG tablet TAKE 1 TABLET BY MOUTH EVERY NIGHT AT BEDTIME 08/30/22  Yes Karie Schwalbe, MD  simvastatin (ZOCOR) 40 MG tablet TAKE ONE TABLET BY MOUTH EVERY NIGHT AT BEDTIME  10/24/22  Yes Karie Schwalbe, MD  tamsulosin (FLOMAX) 0.4 MG CAPS capsule Take 1 capsule (0.4 mg total) by mouth daily after supper. 08/14/22  Yes Karie Schwalbe, MD  acetaminophen (TYLENOL) 500 MG tablet Take 1,000 mg by mouth every 6 (six) hours as needed for moderate pain or headache.    [provider]  aspirin 81 MG EC tablet Take 1 tablet (81 mg total) by mouth daily. Swallow whole. 10/18/21   Hughie Closs, MD  carvedilol (COREG) 3.125 MG tablet TAKE ONE TABLET BY MOUTH TWICE A DAY WITH A MEAL 12/19/22   Nahser, Deloris Ishani Goldwasser, MD  furosemide (LASIX) 20 MG tablet Take 1 tablet (20 mg total) by mouth daily. 11/07/21   Dyann Kief, PA-C  Multiple Vitamin (MULTIVITAMIN WITH MINERALS) TABS tablet Take 1 tablet by mouth daily. 01/23/21   Alwyn Ren, MD  pantoprazole (PROTONIX) 40 MG tablet Take 1 tablet (40 mg total) by mouth daily. 06/06/23 06/05/24  Merwyn Katos, MD  sucralfate (CARAFATE) 1 g tablet Take 1 tablet (1 g total) by mouth 4 (four) times daily. 06/06/23 06/05/24  Merwyn Katos, MD  traMADol (ULTRAM) 50 MG tablet Take 1 tablet (50 mg total) by mouth daily as needed. 06/24/23   Karie Schwalbe, MD  triamcinolone cream (KENALOG) 0.1 % APPLY ONE APPLICATION TOPICALLY TWO TIMES DAILY AS NEEDED Patient taking differently: Apply 1 Application topically 2 (two) times daily as needed. 12/12/22   Karie Schwalbe, MD    Physical Exam: Vitals:   08/02/23 0858 08/02/23 0859 08/02/23 1030 08/02/23 1308  BP: (!) 160/74  (!) 150/65   Pulse: 94  89   Resp: (!) 32  (!) 23   Temp: 100 F (37.8 C)   (!) 100.4 F (38 C)  TempSrc: Oral   Oral  SpO2: 97%  100%   Weight:  67.1 kg    Height:  5\' 6"  (1.676 m)      Constitutional: NAD, calm, comfortable Vitals:   08/02/23 0858 08/02/23 0859 08/02/23 1030 08/02/23 1308  BP: (!) 160/74  (!) 150/65   Pulse: 94  89   Resp: (!) 32  (!) 23   Temp: 100 F (37.8 C)   (!) 100.4 F (38 C)  TempSrc: Oral   Oral  SpO2: 97%  100%    Weight:  67.1 kg    Height:  5\' 6"  (1.676 m)     Eyes: PERRL, lids and conjunctivae normal ENMT: Mucous membranes are moist. Posterior pharynx clear of any exudate or lesions.Normal dentition.  Neck: normal, supple, no masses, no thyromegaly Respiratory: Diminished breathing sound bilaterally, diffused wheezing, no crackles.  Increasing respiratory effort. No accessory muscle use.  Cardiovascular: Regular rate and rhythm, no murmurs / rubs / gallops. No extremity edema. 2+ pedal pulses. No carotid bruits.  Abdomen: no tenderness, no masses palpated. No hepatosplenomegaly. Bowel sounds positive.  Musculoskeletal: no clubbing / cyanosis. No joint deformity upper  and lower extremities. Good ROM, no contractures. Normal muscle tone.  Skin: no rashes, lesions, ulcers. No induration Neurologic: CN 2-12 grossly intact. Sensation intact, DTR normal. Strength 5/5 in all 4.  Psychiatric: Normal judgment and insight. Alert and oriented x 3. Normal mood.     Labs on Admission: I have personally reviewed following labs and imaging studies  CBC: Recent Labs  Lab 08/02/23 0905  WBC 5.9  NEUTROABS 4.4  HGB 10.3*  HCT 33.1*  MCV 85.3  PLT 216   Basic Metabolic Panel: Recent Labs  Lab 08/02/23 0905  NA 137  K 3.4*  CL 102  CO2 27  GLUCOSE 98  BUN 10  CREATININE 1.15  CALCIUM 8.5*   GFR: Estimated Creatinine Clearance: 38.5 mL/min (by C-G formula based on SCr of 1.15 mg/dL). Liver Function Tests: Recent Labs  Lab 08/02/23 0905  AST 19  ALT 11  ALKPHOS 89  BILITOT 0.7  PROT 6.9  ALBUMIN 3.7   No results for input(s): "LIPASE", "AMYLASE" in the last 168 hours. No results for input(s): "AMMONIA" in the last 168 hours. Coagulation Profile: No results for input(s): "INR", "PROTIME" in the last 168 hours. Cardiac Enzymes: No results for input(s): "CKTOTAL", "CKMB", "CKMBINDEX", "TROPONINI" in the last 168 hours. BNP (last 3 results) No results for input(s): "PROBNP" in the last  8760 hours. HbA1C: No results for input(s): "HGBA1C" in the last 72 hours. CBG: No results for input(s): "GLUCAP" in the last 168 hours. Lipid Profile: No results for input(s): "CHOL", "HDL", "LDLCALC", "TRIG", "CHOLHDL", "LDLDIRECT" in the last 72 hours. Thyroid Function Tests: No results for input(s): "TSH", "T4TOTAL", "FREET4", "T3FREE", "THYROIDAB" in the last 72 hours. Anemia Panel: No results for input(s): "VITAMINB12", "FOLATE", "FERRITIN", "TIBC", "IRON", "RETICCTPCT" in the last 72 hours. Urine analysis:    Component Value Date/Time   COLORURINE YELLOW (A) 08/02/2023 0936   APPEARANCEUR CLEAR (A) 08/02/2023 0936   APPEARANCEUR Clear 04/29/2013 1543   LABSPEC 1.017 08/02/2023 0936   LABSPEC 1.020 04/29/2013 1543   PHURINE 6.0 08/02/2023 0936   GLUCOSEU NEGATIVE 08/02/2023 0936   GLUCOSEU Negative 04/29/2013 1543   HGBUR SMALL (A) 08/02/2023 0936   BILIRUBINUR NEGATIVE 08/02/2023 0936   BILIRUBINUR Negative 03/14/2022 1506   BILIRUBINUR Negative 04/29/2013 1543   KETONESUR NEGATIVE 08/02/2023 0936   PROTEINUR 30 (A) 08/02/2023 0936   UROBILINOGEN 0.2 03/14/2022 1506   UROBILINOGEN 0.2 03/08/2012 1624   NITRITE NEGATIVE 08/02/2023 0936   LEUKOCYTESUR NEGATIVE 08/02/2023 0936   LEUKOCYTESUR Negative 04/29/2013 1543    Radiological Exams on Admission: DG Chest 2 View Result Date: 08/02/2023 CLINICAL DATA:  Fever, short of breath EXAM: CHEST - 2 VIEW COMPARISON:  06/06/2023 FINDINGS: Frontal and lateral views of the chest demonstrate an unremarkable cardiac silhouette. No airspace disease, effusion, or pneumothorax. No acute bony abnormalities. IMPRESSION: 1. No acute intrathoracic process. Electronically Signed   By: Sharlet Salina M.D.   On: 08/02/2023 09:24    EKG: Independently reviewed.  Sinus, chronic RBBB, chronic nonspecific ST-T changes on V1-V6.  Assessment/Plan Principal Problem:   Influenza Active Problems:   COPD (chronic obstructive pulmonary disease)  (HCC)   Influenza A with pneumonia  (please populate well all problems here in Problem List. (For example, if patient is on BP meds at home and you resume or decide to hold them, it is a problem that needs to be her. Same for CAD, COPD, HLD and so on)  Acute hypoxic respiratory failure, evidenced by breathing rate> 24 continuously Influenza  A pneumonia -Start Tamiflu -No signs of bacterial pneumonia, discontinue antibiotics -Supportive care with DuoNebs, Tessalon for cough -Incentive spirometry  Fever SIRS -Secondary to influenza A pneumonia, management as above  Acute COPD exacerbation -Likely triggered by influenza A infection -IV Solu-Medrol, bridging for p.o. prednisone -Magnesium x 1 -DuoNebs plus as needed albuterol  PAF -In sinus rhythm -Not on systemic anticoagulation due to severe anemia  HTN Chronic combined HFpEF and HFrEF -Euvolemic -Stable, continue Coreg -As needed hydralazine  BPH -Stable, continue Flomax   Deconditioning -PT OT evaluation   DVT prophylaxis: Lovenox Code Status: DNR Family Communication: None at bedside Disposition Plan: Patient is sick with acute hypoxia respiratory failure influenza A pneumonia as well as COPD exacerbation requiring IV steroid and close monitoring in the hospital, expect more than 2 midnight hospital stay Consults called: None Admission status: Telemetry admission   Emeline General MD [facility] Pager [phone]  08/02/2023, 1:50 PM

## 2023-08-03 DIAGNOSIS — J111 Influenza due to unidentified influenza virus with other respiratory manifestations: Secondary | ICD-10-CM | POA: Diagnosis not present

## 2023-08-03 LAB — CBC
HCT: 31.1 % — ABNORMAL LOW (ref 39.0–52.0)
Hemoglobin: 9.8 g/dL — ABNORMAL LOW (ref 13.0–17.0)
MCH: 26.6 pg (ref 26.0–34.0)
MCHC: 31.5 g/dL (ref 30.0–36.0)
MCV: 84.3 fL (ref 80.0–100.0)
Platelets: 213 10*3/uL (ref 150–400)
RBC: 3.69 MIL/uL — ABNORMAL LOW (ref 4.22–5.81)
RDW: 15.2 % (ref 11.5–15.5)
WBC: 4.9 10*3/uL (ref 4.0–10.5)
nRBC: 0 % (ref 0.0–0.2)

## 2023-08-03 LAB — BASIC METABOLIC PANEL
Anion gap: 7 (ref 5–15)
BUN: 16 mg/dL (ref 8–23)
CO2: 25 mmol/L (ref 22–32)
Calcium: 8.4 mg/dL — ABNORMAL LOW (ref 8.9–10.3)
Chloride: 104 mmol/L (ref 98–111)
Creatinine, Ser: 1.1 mg/dL (ref 0.61–1.24)
GFR, Estimated: >60 mL/min (ref >60)
Glucose, Bld: 163 mg/dL — ABNORMAL HIGH (ref 70–99)
Potassium: 4.3 mmol/L (ref 3.5–5.1)
Sodium: 136 mmol/L (ref 135–145)

## 2023-08-03 MED ORDER — PREDNISONE 20 MG PO TABS
40.0000 mg | ORAL_TABLET | Freq: Every day | ORAL | Status: DC
  Administered 2023-08-04: 40 mg via ORAL
  Filled 2023-08-03: qty 2

## 2023-08-03 MED ORDER — IPRATROPIUM-ALBUTEROL 0.5-2.5 (3) MG/3ML IN SOLN
3.0000 mL | Freq: Three times a day (TID) | RESPIRATORY_TRACT | Status: DC
  Administered 2023-08-03 (×3): 3 mL via RESPIRATORY_TRACT
  Filled 2023-08-03 (×4): qty 3

## 2023-08-03 NOTE — Progress Notes (Addendum)
1015 Pt sats 98% on 2L [place], weaned down to 0.5L via  pt denies SOB sats 95-96%. Will attempt to wean completely off O2 this evening if possible.   1229 Pt masked, ambulated in hallway with  PT on room air, returned to room sitting up in chair. Pt does not appear to be in any acute distress. O2 95-97% RA. Will leave O2 off as pt does not use at home prior to admission.

## 2023-08-03 NOTE — Evaluation (Signed)
Physical Therapy Evaluation Patient Details Name: Leon Estes MRN: [id number] DOB: Oct 12, 1930 Today's Date: 08/03/2023  History of Present Illness  Leon Estes is a 89 y.o. male with medical history significant of chronic combined HFrEF and HFpEF, COPD, PAF not on anticoagulation due to severe anemia, CAD status post remote PCI's, CKD stage IIIa, HTN, HLD, presented with new onset of cough wheezing shortness of breath.     Symptoms started 2 days ago patient started develop dry cough wheezing and shortness of breath, denies any chest pain no fever or chills.  Symptoms became severe overnight and decided to come to the hospital.  Denied any GI symptoms no nauseous vomiting or diarrhea.  Denied any dysuria.  Clinical Impression  Pt received in chair with out supplement O2.  Pt lives with wife and Ind with household level ambulation with FWW but also walks without ad. Pt has 2-3 sSTE house. Pt agreeable to PT evaluation. Pt transferred OOB with sup and ambulated around the nurses station with Mask on with sup. Needs VC for safe use of FWW and direction. Pt SPO2 after exertion 97% at RA> pt lef tin chair with alarm activated and ll needs within reach. Pt referred to Mobility specialists for ambulation and stair practice. Pt will continue in acute and Pt will benefit from continued PT after acute to improve safety and reduce fall risk.       If plan is discharge home, recommend the following: A little help with walking and/or transfers;Assistance with cooking/housework;Assistance with feeding;Direct supervision/assist for medications management;Help with stairs or ramp for entrance;A little help with bathing/dressing/bathroom   Can travel by private vehicle        Equipment Recommendations Rolling walker (2 wheels)  Recommendations for Other Services       Functional Status Assessment Patient has had a recent decline in their functional status and demonstrates the ability to make  significant improvements in function in a reasonable and predictable amount of time.     Precautions / Restrictions Precautions Precautions: Fall Restrictions Weight Bearing Restrictions Per Provider Order: No      Mobility  Bed Mobility               General bed mobility comments: received in chair    Transfers Overall transfer level: Needs assistance Equipment used: Rolling walker (2 wheels) Transfers: Sit to/from Stand, Bed to chair/wheelchair/BSC Sit to Stand: Supervision   Step pivot transfers: Supervision       General transfer comment: needs VC to lsow down and use the RW at all time.    Ambulation/Gait Ambulation/Gait assistance: Supervision Gait Distance (Feet): 180 Feet Assistive device: Rolling walker (2 wheels) Gait Pattern/deviations: Step-through pattern Gait velocity: good     General Gait Details: fast and keeps walker away  Stairs            Wheelchair Mobility     Tilt Bed    Modified Rankin (Stroke Patients Only)       Balance                                             Pertinent Vitals/Pain Pain Assessment Pain Assessment: No/denies pain    Home Living Family/patient expects to be discharged to:: Private residence Living Arrangements: Spouse/significant other Available Help at Discharge: Family;Available 24 hours/day Type of Home: House Home Access: Stairs to enter Entrance Stairs-Rails:  Can reach both Entrance Stairs-Number of Steps: 2-3   Home Layout: One level        Prior Function Prior Level of Function : Needs assist             Mobility Comments: Ind ambulator with FWW but also walk without AD intermitently. ADLs Comments: (I) BADLs, although wife supervises shower for safety. Pt drives golf cart on property to go [place]; does not drive a car in the community. Typically washes dishes and carries laundry basket out to clothesline. Goes to bowling alley with  friends; no longer bowls, but socializes.     Extremity/Trunk Assessment   Upper Extremity Assessment Upper Extremity Assessment: Defer to OT evaluation    Lower Extremity Assessment Lower Extremity Assessment: Generalized weakness    Cervical / Trunk Assessment Cervical / Trunk Assessment: Kyphotic  Communication   Communication Communication: Hearing impairment Cueing Techniques: Verbal cues  Cognition Arousal: Alert Behavior During Therapy: Impulsive Overall Cognitive Status: History of cognitive impairments - at baseline                                 General Comments: Pt is oriented to self, wife, hospital,day, year but not to date or safetyu awareness. Does have dx of vascular dementia.        General Comments      Exercises     Assessment/Plan    PT Assessment Patient needs continued PT services  PT Problem List Decreased strength;Decreased activity tolerance;Decreased mobility;Decreased balance;Decreased cognition;Decreased safety awareness;Cardiopulmonary status limiting activity       PT Treatment Interventions Gait training;Stair training;Neuromuscular re-education;Therapeutic activities;Therapeutic exercise;Balance training;Patient/family education    PT Goals (Current goals can be found in the Care Plan section)  Acute Rehab PT Goals Patient Stated Goal: " Want to go home to his wife." PT Goal Formulation: With patient Time For Goal Achievement: 08/10/23 Potential to Achieve Goals: Good    Frequency Min 1X/week     Co-evaluation               AM-PAC PT "6 Clicks" Mobility  Outcome Measure Help needed turning from your back to your side while in a flat bed without using bedrails?: None Help needed moving from lying on your back to sitting on the side of a flat bed without using bedrails?: None Help needed moving to and from a bed to a chair (including a wheelchair)?: A Little Help needed standing up from a chair using your  arms (e.g., wheelchair or bedside chair)?: A Little Help needed to walk in hospital room?: A Little Help needed climbing 3-5 steps with a railing? : A Little 6 Click Score: 20    End of Session Equipment Utilized During Treatment: Gait belt Activity Tolerance: Patient tolerated treatment well Patient left: in chair;with chair alarm set;with call bell/phone within reach Nurse Communication: Mobility status PT Visit Diagnosis: Muscle weakness (generalized) (M62.81);Unsteadiness on feet (R26.81)    Time: 1211-1226 PT Time Calculation (min) (ACUTE ONLY): 15 min   Charges:   PT Evaluation $PT Eval Low Complexity: 1 Low   PT General Charges $$ ACUTE PT VISIT: 1 Visit        Janet Berlin PT DPT 12:50 PM,08/03/23

## 2023-08-03 NOTE — Progress Notes (Signed)
PROGRESS NOTE    Leon Estes  QMV:[id number] DOB: [birth year] DOA: 08/02/2023 PCP: Karie Schwalbe, MD  Outpatient Specialists: cardiology    Brief Narrative:   From admission h and p  Leon Estes is a 89 y.o. male with medical history significant of chronic combined HFrEF and HFpEF, COPD, PAF not on anticoagulation due to severe anemia, CAD status post remote PCI's, CKD stage IIIa, HTN, HLD, presented with new onset of cough wheezing shortness of breath.   Symptoms started 2 days ago patient started develop dry cough wheezing and shortness of breath, denies any chest pain no fever or chills.  Symptoms became severe overnight and decided to come to the hospital.  Denied any GI symptoms no nauseous vomiting or diarrhea.  Denied any dysuria.  Assessment & Plan:   Principal Problem:   Influenza Active Problems:   Chronic combined systolic (congestive) and diastolic (congestive) heart failure (HCC)   PAF (paroxysmal atrial fibrillation) (HCC)   Benign essential HTN   CAD (coronary artery disease)   COPD (chronic obstructive pulmonary disease) (HCC)   Influenza A with pneumonia  # Influenza A pneumonia # Sepsis Sepsis by fever, tachycardia. improving - continue renally-dosed tamiflu, steroids as below  # Acute hypoxic respiratory failure 2/2 flu a, co-morbid copd - on 2 liters, wean as able  # COPD No wheeze but reasonable to continue steroids - continue steroids  # paroxysmal a-fib Rate controlled - continue coreg - not anticoagulated  # CAD S/p DES in 1997, asymptomatic, no significant trop elevation - cont home asa, coreg  # BPH - cont home flomax  # HFrEF Ef 45-50 last check. Appears euvolemic - holding home lasix  - cont coreg  # HTN Here normotensive - holding home losartan while ill - cont home coreg  # Debility - PT/OT consults  DVT prophylaxis: lovenox Code Status: dnr Family Communication: daughter Leon Estes updated telephonically  12/21  Level of care: Telemetry Medical Status is: Inpatient Remains inpatient appropriate because: severity of illness    Consultants:  none  Procedures: none  Antimicrobials:  tamiflu    Subjective: Feeling well, breathing improving  Objective: Vitals:   08/02/23 2032 08/02/23 2340 08/03/23 0823 08/03/23 0859  BP:  113/62  132/75  Pulse:  79  79  Resp:  18  17  Temp:  97.6 F (36.4 C)    TempSrc:  Oral    SpO2: 96% 100% 96% 97%  Weight:      Height:        Intake/Output Summary (Last 24 hours) at 08/03/2023 1007 Last data filed at 08/03/2023 0759 Gross per 24 hour  Intake 353.75 ml  Output 1050 ml  Net -696.25 ml   Filed Weights   08/02/23 0859  Weight: 67.1 kg    Examination:  General exam: Appears calm and comfortable  Respiratory system: Clear to auscultation. Respiratory effort normal. Cardiovascular system: S1 & S2 heard, RRR. Distant heart sounds Gastrointestinal system: Abdomen is nondistended, soft and nontender. No organomegaly or masses felt. Normal bowel sounds heard. Central nervous system: Alert and oriented. No focal neurological deficits. Hard of hearing Extremities: Symmetric 5 x 5 power. Skin: No rashes, lesions or ulcers Psychiatry: Judgement and insight appear normal. Mood & affect appropriate.     Data Reviewed: I have personally reviewed following labs and imaging studies  CBC: Recent Labs  Lab 08/02/23 0905 08/03/23 0431  WBC 5.9 4.9  NEUTROABS 4.4  --   HGB 10.3* 9.8*  HCT 33.1*  31.1*  MCV 85.3 84.3  PLT 216 213   Basic Metabolic Panel: Recent Labs  Lab 08/02/23 0905 08/03/23 0431  NA 137 136  K 3.4* 4.3  CL 102 104  CO2 27 25  GLUCOSE 98 163*  BUN 10 16  CREATININE 1.15 1.10  CALCIUM 8.5* 8.4*   GFR: Estimated Creatinine Clearance: 40.3 mL/min (by C-G formula based on SCr of 1.1 mg/dL). Liver Function Tests: Recent Labs  Lab 08/02/23 0905  AST 19  ALT 11  ALKPHOS 89  BILITOT 0.7  PROT 6.9   ALBUMIN 3.7   No results for input(s): "LIPASE", "AMYLASE" in the last 168 hours. No results for input(s): "AMMONIA" in the last 168 hours. Coagulation Profile: No results for input(s): "INR", "PROTIME" in the last 168 hours. Cardiac Enzymes: No results for input(s): "CKTOTAL", "CKMB", "CKMBINDEX", "TROPONINI" in the last 168 hours. BNP (last 3 results) No results for input(s): "PROBNP" in the last 8760 hours. HbA1C: No results for input(s): "HGBA1C" in the last 72 hours. CBG: No results for input(s): "GLUCAP" in the last 168 hours. Lipid Profile: No results for input(s): "CHOL", "HDL", "LDLCALC", "TRIG", "CHOLHDL", "LDLDIRECT" in the last 72 hours. Thyroid Function Tests: No results for input(s): "TSH", "T4TOTAL", "FREET4", "T3FREE", "THYROIDAB" in the last 72 hours. Anemia Panel: No results for input(s): "VITAMINB12", "FOLATE", "FERRITIN", "TIBC", "IRON", "RETICCTPCT" in the last 72 hours. Urine analysis:    Component Value Date/Time   COLORURINE YELLOW (A) 08/02/2023 0936   APPEARANCEUR CLEAR (A) 08/02/2023 0936   APPEARANCEUR Clear 04/29/2013 1543   LABSPEC 1.017 08/02/2023 0936   LABSPEC 1.020 04/29/2013 1543   PHURINE 6.0 08/02/2023 0936   GLUCOSEU NEGATIVE 08/02/2023 0936   GLUCOSEU Negative 04/29/2013 1543   HGBUR SMALL (A) 08/02/2023 0936   BILIRUBINUR NEGATIVE 08/02/2023 0936   BILIRUBINUR Negative 03/14/2022 1506   BILIRUBINUR Negative 04/29/2013 1543   KETONESUR NEGATIVE 08/02/2023 0936   PROTEINUR 30 (A) 08/02/2023 0936   UROBILINOGEN 0.2 03/14/2022 1506   UROBILINOGEN 0.2 03/08/2012 1624   NITRITE NEGATIVE 08/02/2023 0936   LEUKOCYTESUR NEGATIVE 08/02/2023 0936   LEUKOCYTESUR Negative 04/29/2013 1543   Sepsis Labs: @LABRCNTIP (procalcitonin:4,lacticidven:4)  ) Recent Results (from the past 240 hours)  Culture, blood (routine x 2)     Status: None (Preliminary result)   Collection Time: 08/02/23  9:05 AM   Specimen: BLOOD  Result Value Ref Range Status    Specimen Description BLOOD  RAC  Final   Special Requests   Final    BOTTLES DRAWN AEROBIC AND ANAEROBIC Blood Culture results may not be optimal due to an inadequate volume of blood received in culture bottles   Culture   Final    NO GROWTH < 24 HOURS Performed at [facility], [address] [place], [place] [address]    Report Status PENDING  Incomplete  Resp panel by RT-PCR (RSV, Flu A&B, Covid) Anterior Nasal Swab     Status: Abnormal   Collection Time: 08/02/23  9:05 AM   Specimen: Anterior Nasal Swab  Result Value Ref Range Status   SARS Coronavirus 2 by RT PCR NEGATIVE NEGATIVE Final    Comment: (NOTE) SARS-CoV-2 target nucleic acids are NOT DETECTED.  The SARS-CoV-2 RNA is generally detectable in upper respiratory specimens during the acute phase of infection. The lowest concentration of SARS-CoV-2 viral copies this assay can detect is 138 copies/mL. A negative result does not preclude SARS-Cov-2 infection and should not be used as the sole basis for treatment or other patient  management decisions. A negative result may occur with  improper specimen collection/handling, submission of specimen other than nasopharyngeal swab, presence of viral mutation(s) within the areas targeted by this assay, and inadequate number of viral copies(<138 copies/mL). A negative result must be combined with clinical observations, patient history, and epidemiological information. The expected result is Negative.  Fact Sheet for Patients:  BloggerCourse.com  Fact Sheet for Healthcare Providers:  SeriousBroker.it  This test is no t yet approved or cleared by the [place] FDA and  has been authorized for detection and/or diagnosis of SARS-CoV-2 by FDA under an Emergency Use Authorization (EUA). This EUA will remain  in effect (meaning this test can be used) for the duration of the COVID-19 declaration under Section 564(b)(1)  of the Act, 21 U.S.C.section 360bbb-3(b)(1), unless the authorization is terminated  or revoked sooner.       Influenza A by PCR POSITIVE (A) NEGATIVE Final   Influenza B by PCR NEGATIVE NEGATIVE Final    Comment: (NOTE) The Xpert Xpress SARS-CoV-2/FLU/RSV plus assay is intended as an aid in the diagnosis of influenza from Nasopharyngeal swab specimens and should not be used as a sole basis for treatment. Nasal washings and aspirates are unacceptable for Xpert Xpress SARS-CoV-2/FLU/RSV testing.  Fact Sheet for Patients: BloggerCourse.com  Fact Sheet for Healthcare Providers: SeriousBroker.it  This test is not yet approved or cleared by the [place] FDA and has been authorized for detection and/or diagnosis of SARS-CoV-2 by FDA under an Emergency Use Authorization (EUA). This EUA will remain in effect (meaning this test can be used) for the duration of the COVID-19 declaration under Section 564(b)(1) of the Act, 21 U.S.C. section 360bbb-3(b)(1), unless the authorization is terminated or revoked.     Resp Syncytial Virus by PCR NEGATIVE NEGATIVE Final    Comment: (NOTE) Fact Sheet for Patients: BloggerCourse.com  Fact Sheet for Healthcare Providers: SeriousBroker.it  This test is not yet approved or cleared by the [place] FDA and has been authorized for detection and/or diagnosis of SARS-CoV-2 by FDA under an Emergency Use Authorization (EUA). This EUA will remain in effect (meaning this test can be used) for the duration of the COVID-19 declaration under Section 564(b)(1) of the Act, 21 U.S.C. section 360bbb-3(b)(1), unless the authorization is terminated or revoked.  Performed at [facility], [address] [place], [place] [address]   Culture, blood (routine x 2)     Status: None (Preliminary result)   Collection Time: 08/02/23  9:35 AM    Specimen: BLOOD  Result Value Ref Range Status   Specimen Description BLOOD LEFT ANTECUBITAL  Final   Special Requests   Final    BOTTLES DRAWN AEROBIC AND ANAEROBIC Blood Culture results may not be optimal due to an inadequate volume of blood received in culture bottles   Culture   Final    NO GROWTH < 24 HOURS Performed at [facility], [address] [place], [place] [address]    Report Status PENDING  Incomplete         Radiology Studies: DG Chest 2 View Result Date: 08/02/2023 CLINICAL DATA:  Fever, short of breath EXAM: CHEST - 2 VIEW COMPARISON:  06/06/2023 FINDINGS: Frontal and lateral views of the chest demonstrate an unremarkable cardiac silhouette. No airspace disease, effusion, or pneumothorax. No acute bony abnormalities. IMPRESSION: 1. No acute intrathoracic process. Electronically Signed   By: Sharlet Salina M.D.   On: 08/02/2023 09:24        Scheduled Meds:  aspirin EC  81 mg Oral Daily   budesonide  0.5 mg Nebulization BID   carvedilol  3.125 mg Oral BID WC   enoxaparin (LOVENOX) injection  40 mg Subcutaneous Q24H   gabapentin  400 mg Oral QHS   guaiFENesin  600 mg Oral BID   ipratropium-albuterol  3 mL Nebulization TID   methylPREDNISolone (SOLU-MEDROL) injection  120 mg Intravenous Q24H   oseltamivir  30 mg Oral BID   pantoprazole  40 mg Oral Daily   potassium chloride  10 mEq Oral Daily   QUEtiapine  25 mg Oral QHS   simvastatin  40 mg Oral QHS   sucralfate  1 g Oral TID WC & HS   tamsulosin  0.4 mg Oral QPC supper   Continuous Infusions:   LOS: 1 day     Silvano Bilis, MD [facility]   If 7PM-7AM, please contact night-coverage [email or web address] Password [facility] 08/03/2023, 10:07 AM

## 2023-08-04 DIAGNOSIS — J111 Influenza due to unidentified influenza virus with other respiratory manifestations: Secondary | ICD-10-CM | POA: Diagnosis not present

## 2023-08-04 LAB — BASIC METABOLIC PANEL
Anion gap: 9 (ref 5–15)
BUN: 27 mg/dL — ABNORMAL HIGH (ref 8–23)
CO2: 24 mmol/L (ref 22–32)
Calcium: 8.7 mg/dL — ABNORMAL LOW (ref 8.9–10.3)
Chloride: 103 mmol/L (ref 98–111)
Creatinine, Ser: 1 mg/dL (ref 0.61–1.24)
GFR, Estimated: >60 mL/min (ref >60)
Glucose, Bld: 107 mg/dL — ABNORMAL HIGH (ref 70–99)
Potassium: 4 mmol/L (ref 3.5–5.1)
Sodium: 136 mmol/L (ref 135–145)

## 2023-08-04 MED ORDER — OSELTAMIVIR PHOSPHATE 30 MG PO CAPS: 30.0000 mg | ORAL_CAPSULE | Freq: Two times a day (BID) | ORAL | 0 refills | Status: AC

## 2023-08-04 NOTE — Plan of Care (Signed)
  Problem: Activity: Goal: Risk for activity intolerance will decrease Outcome: Progressing   

## 2023-08-04 NOTE — Discharge Summary (Signed)
Leon Estes:[id number] DOB: 1931/06/09 DOA: 08/02/2023  PCP: Karie Schwalbe, MD  Admit date: 08/02/2023 Discharge date: 08/04/2023  Time spent: 35 minutes  Recommendations for Outpatient Follow-up:  Pcp f/u     Discharge Diagnoses:  Principal Problem:   Influenza Active Problems:   Chronic combined systolic (congestive) and diastolic (congestive) heart failure (HCC)   PAF (paroxysmal atrial fibrillation) (HCC)   Benign essential HTN   CAD (coronary artery disease)   COPD (chronic obstructive pulmonary disease) (HCC)   Influenza A with pneumonia   Discharge Condition: improved  Diet recommendation: heart healthy  Filed Weights   08/02/23 0859  Weight: 67.1 kg    History of present illness:  From admission h and p Leon Estes is a 89 y.o. male with medical history significant of chronic combined HFrEF and HFpEF, COPD, PAF not on anticoagulation due to severe anemia, CAD status post remote PCI's, CKD stage IIIa, HTN, HLD, presented with new onset of cough wheezing shortness of breath.   Symptoms started 2 days ago patient started develop dry cough wheezing and shortness of breath, denies any chest pain no fever or chills.  Symptoms became severe overnight and decided to come to the hospital.  Denied any GI symptoms no nauseous vomiting or diarrhea.  Denied any dysuria.  Hospital Course:  Patient presents with cough, found to have influenza a. Also with hypoxic respiratory failure requiring 2 liters o2. Patient was treated with tamiflu and prednisone. Symptoms quickly improved, now weaned off oxygen, ambulated without significant dyspnea or hypoxia, cleared for d/c by PT with [facility] PT (ordered). Given rapid improvement in symptoms and lack of cough/wheeze on day of discharge discussed pros and cons of course of steroids with family and we decided to forgo steroids. Patient will complete a course of tamiflu. Other chronic medical problems stable. Home bp meds held on  admission given illness and normotension, advise resuming at discharge as BPs have risen.  Procedures: none   Consultations: none  Discharge Exam: Vitals:   08/04/23 0335 08/04/23 0750  BP: (!) 156/70 (!) 172/92  Pulse: 77 73  Resp: 20 16  Temp: (!) 97.5 F (36.4 C) 97.8 F (36.6 C)  SpO2: 97% 98%    General: NAD Cardiovascular: RRR Respiratory: CTAB  Discharge Instructions   Discharge Instructions     Diet - low sodium heart healthy   Complete by: As directed    Increase activity slowly   Complete by: As directed       Allergies as of 08/04/2023       Reactions   Augmentin [amoxicillin-pot Clavulanate] Rash   Bacitracin-polymyxin B Swelling, Rash   Benzalkonium Chloride    "Benzalkonium chloride is a quaternary ammonium antiseptic and disinfectant with actions and uses similar to those of other cationic surfactants. It is also used as an antimicrobial preservative for pharmaceutical products."   Neosporin [neomycin-bacitracin Zn-polymyx] Rash   Sulfamethoxazole-trimethoprim Rash   Terbinafine And Related Rash        Medication List     TAKE these medications    acetaminophen 500 MG tablet Commonly known as: TYLENOL Take 1,000 mg by mouth every 6 (six) hours as needed for moderate pain or headache.   acetaminophen 650 MG CR tablet Commonly known as: TYLENOL Take 1,300 mg by mouth every 8 (eight) hours as needed for pain.   Aspirin Low Dose 81 MG tablet Generic drug: aspirin EC Take 1 tablet (81 mg total) by mouth daily. Swallow whole.  budesonide 0.5 MG/2ML nebulizer solution Commonly known as: Pulmicort Take 2 mLs (0.5 mg total) by nebulization in the morning and at bedtime.   carvedilol 3.125 MG tablet Commonly known as: COREG TAKE ONE TABLET BY MOUTH TWICE A DAY WITH A MEAL   furosemide 20 MG tablet Commonly known as: LASIX Take 1 tablet (20 mg total) by mouth daily.   gabapentin 400 MG capsule Commonly known as: NEURONTIN Take 1-2  capsules (400-800 mg total) by mouth at bedtime.   ipratropium-albuterol 0.5-2.5 (3) MG/3ML Soln Commonly known as: DUONEB Take 3 ml by nebulization 3-4 times daily as needed   losartan 25 MG tablet Commonly known as: COZAAR TAKE ONE HALF (1/2) TABLET BY MOUTH DAILY What changed: See the new instructions.   multivitamin with minerals Tabs tablet Take 1 tablet by mouth daily.   oseltamivir 30 MG capsule Commonly known as: TAMIFLU Take 1 capsule (30 mg total) by mouth 2 (two) times daily for 5 doses.   pantoprazole 40 MG tablet Commonly known as: Protonix Take 1 tablet (40 mg total) by mouth daily.   potassium chloride 10 MEQ tablet Commonly known as: KLOR-CON TAKE ONE TABLET BY MOUTH ONCE A DAY   QUEtiapine 25 MG tablet Commonly known as: SEROQUEL TAKE 1 TABLET BY MOUTH EVERY NIGHT AT BEDTIME   simvastatin 40 MG tablet Commonly known as: ZOCOR TAKE ONE TABLET BY MOUTH EVERY NIGHT AT BEDTIME   sucralfate 1 g tablet Commonly known as: Carafate Take 1 tablet (1 g total) by mouth 4 (four) times daily.   tamsulosin 0.4 MG Caps capsule Commonly known as: FLOMAX Take 1 capsule (0.4 mg total) by mouth daily after supper.   traMADol 50 MG tablet Commonly known as: ULTRAM Take 1 tablet (50 mg total) by mouth daily as needed.   triamcinolone cream 0.1 % Commonly known as: KENALOG APPLY ONE APPLICATION TOPICALLY TWO TIMES DAILY AS NEEDED       Allergies  Allergen Reactions   Augmentin [Amoxicillin-Pot Clavulanate] Rash   Bacitracin-Polymyxin B Swelling and Rash   Benzalkonium Chloride     "Benzalkonium chloride is a quaternary ammonium antiseptic and disinfectant with actions and uses similar to those of other cationic surfactants. It is also used as an antimicrobial preservative for pharmaceutical products."   Neosporin [Neomycin-Bacitracin Zn-Polymyx] Rash   Sulfamethoxazole-Trimethoprim Rash   Terbinafine And Related Rash    Follow-up Information     Karie Schwalbe, MD Follow up.   Specialties: Internal Medicine, Pediatrics Contact information: [address] [place] [address] [phone]                  The results of significant diagnostics from this hospitalization (including imaging, microbiology, ancillary and laboratory) are listed below for reference.    Significant Diagnostic Studies: DG Chest 2 View Result Date: 08/02/2023 CLINICAL DATA:  Fever, short of breath EXAM: CHEST - 2 VIEW COMPARISON:  06/06/2023 FINDINGS: Frontal and lateral views of the chest demonstrate an unremarkable cardiac silhouette. No airspace disease, effusion, or pneumothorax. No acute bony abnormalities. IMPRESSION: 1. No acute intrathoracic process. Electronically Signed   By: Sharlet Salina M.D.   On: 08/02/2023 09:24    Microbiology: Recent Results (from the past 240 hours)  Culture, blood (routine x 2)     Status: None (Preliminary result)   Collection Time: 08/02/23  9:05 AM   Specimen: BLOOD  Result Value Ref Range Status   Specimen Description BLOOD  RAC  Final   Special  Requests   Final    BOTTLES DRAWN AEROBIC AND ANAEROBIC Blood Culture results may not be optimal due to an inadequate volume of blood received in culture bottles   Culture   Final    NO GROWTH 2 DAYS Performed at [facility], [address], [place], [place] [address]    Report Status PENDING  Incomplete  Resp panel by RT-PCR (RSV, Flu A&B, Covid) Anterior Nasal Swab     Status: Abnormal   Collection Time: 08/02/23  9:05 AM   Specimen: Anterior Nasal Swab  Result Value Ref Range Status   SARS Coronavirus 2 by RT PCR NEGATIVE NEGATIVE Final    Comment: (NOTE) SARS-CoV-2 target nucleic acids are NOT DETECTED.  The SARS-CoV-2 RNA is generally detectable in upper respiratory specimens during the acute phase of infection. The lowest concentration of SARS-CoV-2 viral copies this assay can detect is 138 copies/mL. A negative result does not  preclude SARS-Cov-2 infection and should not be used as the sole basis for treatment or other patient management decisions. A negative result may occur with  improper specimen collection/handling, submission of specimen other than nasopharyngeal swab, presence of viral mutation(s) within the areas targeted by this assay, and inadequate number of viral copies(<138 copies/mL). A negative result must be combined with clinical observations, patient history, and epidemiological information. The expected result is Negative.  Fact Sheet for Patients:  BloggerCourse.com  Fact Sheet for Healthcare Providers:  SeriousBroker.it  This test is no t yet approved or cleared by the [place] FDA and  has been authorized for detection and/or diagnosis of SARS-CoV-2 by FDA under an Emergency Use Authorization (EUA). This EUA will remain  in effect (meaning this test can be used) for the duration of the COVID-19 declaration under Section 564(b)(1) of the Act, 21 U.S.C.section 360bbb-3(b)(1), unless the authorization is terminated  or revoked sooner.       Influenza A by PCR POSITIVE (A) NEGATIVE Final   Influenza B by PCR NEGATIVE NEGATIVE Final    Comment: (NOTE) The Xpert Xpress SARS-CoV-2/FLU/RSV plus assay is intended as an aid in the diagnosis of influenza from Nasopharyngeal swab specimens and should not be used as a sole basis for treatment. Nasal washings and aspirates are unacceptable for Xpert Xpress SARS-CoV-2/FLU/RSV testing.  Fact Sheet for Patients: BloggerCourse.com  Fact Sheet for Healthcare Providers: SeriousBroker.it  This test is not yet approved or cleared by the [place] FDA and has been authorized for detection and/or diagnosis of SARS-CoV-2 by FDA under an Emergency Use Authorization (EUA). This EUA will remain in effect (meaning this test can be used) for the  duration of the COVID-19 declaration under Section 564(b)(1) of the Act, 21 U.S.C. section 360bbb-3(b)(1), unless the authorization is terminated or revoked.     Resp Syncytial Virus by PCR NEGATIVE NEGATIVE Final    Comment: (NOTE) Fact Sheet for Patients: BloggerCourse.com  Fact Sheet for Healthcare Providers: SeriousBroker.it  This test is not yet approved or cleared by the [place] FDA and has been authorized for detection and/or diagnosis of SARS-CoV-2 by FDA under an Emergency Use Authorization (EUA). This EUA will remain in effect (meaning this test can be used) for the duration of the COVID-19 declaration under Section 564(b)(1) of the Act, 21 U.S.C. section 360bbb-3(b)(1), unless the authorization is terminated or revoked.  Performed at [facility], [address] [place], [place] [address]   Culture, blood (routine x 2)     Status: None (Preliminary result)  Collection Time: 08/02/23  9:35 AM   Specimen: BLOOD  Result Value Ref Range Status   Specimen Description BLOOD LEFT ANTECUBITAL  Final   Special Requests   Final    BOTTLES DRAWN AEROBIC AND ANAEROBIC Blood Culture results may not be optimal due to an inadequate volume of blood received in culture bottles   Culture   Final    NO GROWTH 2 DAYS Performed at [facility], [address] [place], [place] [address]    Report Status PENDING  Incomplete     Labs: Basic Metabolic Panel: Recent Labs  Lab 08/02/23 0905 08/03/23 0431 08/04/23 0351  NA 137 136 136  K 3.4* 4.3 4.0  CL 102 104 103  CO2 27 25 24   GLUCOSE 98 163* 107*  BUN 10 16 27*  CREATININE 1.15 1.10 1.00  CALCIUM 8.5* 8.4* 8.7*   Liver Function Tests: Recent Labs  Lab 08/02/23 0905  AST 19  ALT 11  ALKPHOS 89  BILITOT 0.7  PROT 6.9  ALBUMIN 3.7   No results for input(s): "LIPASE", "AMYLASE" in the last 168 hours. No results for input(s): "AMMONIA" in  the last 168 hours. CBC: Recent Labs  Lab 08/02/23 0905 08/03/23 0431  WBC 5.9 4.9  NEUTROABS 4.4  --   HGB 10.3* 9.8*  HCT 33.1* 31.1*  MCV 85.3 84.3  PLT 216 213   Cardiac Enzymes: No results for input(s): "CKTOTAL", "CKMB", "CKMBINDEX", "TROPONINI" in the last 168 hours. BNP: BNP (last 3 results) No results for input(s): "BNP" in the last 8760 hours.  ProBNP (last 3 results) No results for input(s): "PROBNP" in the last 8760 hours.  CBG: No results for input(s): "GLUCAP" in the last 168 hours.     Signed:  Silvano Bilis MD.  [facility] 08/04/2023, 10:28 AM

## 2023-08-04 NOTE — Progress Notes (Signed)
Transition of Care [facility]) - Inpatient Brief Assessment   Patient Details  Name: Leon Estes MRN: [id number] Date of Birth: 05-27-1931  Transition of Care [facility]) CM/SW Contact:    Bing Quarry, RN Phone Number: 08/04/2023, 11:07 AM   Clinical Narrative: 12/22: Patient admitted 08/02/23 presenting to ED  presenting with new onset of cough wheezing shortness of breath. Was Dx with +Flu A.   PMH significant for chronic combined HFrEF and HFpEF, COPD, PAF not on anticoagulation due to severe anemia, CAD status post remote PCI's, CKD stage IIIa, HTN, HLD per provider/PT notes.   PTA patient lives with spouse in on level home, with needed DME including rollator; BSC/3in1,Transport chair; Grab bars; tub/shower seat. Spouse provides supervision for safety.   Discharging today with HH PT via Delila Spence confirmed. Daughter, Benito Mccreedy, [phone], approved this as RN CM unable to reach spouse.   Please contact TOC RN CM if any discharge needs change or arise prior to discharge from acute care hospital setting today. Thank you.    Gabriel Cirri MSN RN CM  Care Management Department.  [facility] Direct Dial: [phone] Main Office Phone: [phone] Weekends Only    Transition of Care Asessment: Insurance and Status: Insurance coverage has been reviewed Patient has primary care physician: Yes Home environment has been reviewed: one level home with spouse Prior level of function:: Prior Level of Function : Needs assist  Mobility Comments: Ind ambulator with FWW but also walk without AD intermitently.  ADLs Comments: (I) BADLs, although wife supervises shower for safety. Prior/Current Home Services: No current home services Social Drivers of Health Review: SDOH reviewed no interventions necessary Readmission risk has been reviewed: Yes Transition of care needs: transition of care needs identified, TOC will continue to follow

## 2023-08-05 ENCOUNTER — Telehealth: Payer: Self-pay | Admitting: Internal Medicine

## 2023-08-05 ENCOUNTER — Telehealth: Payer: Self-pay | Admitting: *Deleted

## 2023-08-05 NOTE — Telephone Encounter (Signed)
Spoke to [place]. Advised her it was fine to wait until the 26th.

## 2023-08-05 NOTE — Transitions of Care (Post Inpatient/ED Visit) (Signed)
   08/05/2023  Name: Leon Estes MRN: [id number] DOB: 05-Nov-1930  Today's TOC FU Call Status: Today's TOC FU Call Status:: Unsuccessful Call (1st Attempt) Unsuccessful Call (1st Attempt) Date: 08/05/23  Attempted to reach the patient regarding the most recent Inpatient/ED visit.  Follow Up Plan: Additional outreach attempts will be made to reach the patient to complete the Transitions of Care (Post Inpatient/ED visit) call.   [name] BSN RN Population Health- Transition of Care Team.  [facility] [phone]

## 2023-08-05 NOTE — Telephone Encounter (Signed)
Copied from CRM [id number]. Topic: General - Other >> Aug 05, 2023  4:19 PM Alona Bene A wrote: Reason for CRM: Eileen Stanford from [facility] called in to advise pts daughter would like to wait until 08/08/23 to start PT. Please reach out to [place] at [phone] if there are any questions or concerns.

## 2023-08-06 ENCOUNTER — Telehealth: Payer: Self-pay

## 2023-08-06 DIAGNOSIS — J09X9 Influenza due to identified novel influenza A virus with other manifestations: Secondary | ICD-10-CM | POA: Diagnosis not present

## 2023-08-06 NOTE — Transitions of Care (Post Inpatient/ED Visit) (Signed)
08/06/2023  Name: Leon Estes MRN: [id number] DOB: 03-10-1931  Today's TOC FU Call Status: Today's TOC FU Call Status:: Successful TOC FU Call Completed TOC FU Call Complete Date: 08/06/23 Patient's Name and Date of Birth confirmed.  Transition Care Management Follow-up Telephone Call Date of Discharge: 08/04/23 Discharge Facility: [facility]) Type of Discharge: Inpatient Admission Primary Inpatient Discharge Diagnosis:: Influenza How have you been since you were released from the hospital?: Better Any questions or concerns?: No  Items Reviewed: Did you receive and understand the discharge instructions provided?: Yes Medications obtained,verified, and reconciled?: Yes (Medications Reviewed) Any new allergies since your discharge?: No Dietary orders reviewed?: NA Do you have support at home?: Yes People in Home: spouse Name of Support/Comfort Primary Source: Wilfred Lacy  Medications Reviewed Today: Medications Reviewed Today     Reviewed by Redge Gainer, RN (Case Manager) on 08/06/23 at 1412  Med List Status: <None>   Medication Order Taking? Sig Documenting Provider Last Dose Status Informant  acetaminophen (TYLENOL) 500 MG tablet [id number] No Take 1,000 mg by mouth every 6 (six) hours as needed for moderate pain or headache.  Patient not taking: Reported on 08/02/2023   [provider] Not Taking Active Spouse/Significant Other  acetaminophen (TYLENOL) 650 MG CR tablet [id number] No Take 1,300 mg by mouth every 8 (eight) hours as needed for pain. [provider] Past Week Active Spouse/Significant Other  aspirin 81 MG EC tablet [id number] No Take 1 tablet (81 mg total) by mouth daily. Swallow whole. Hughie Closs, MD 08/01/2023 Active Spouse/Significant Other  budesonide (PULMICORT) 0.5 MG/2ML nebulizer solution [id number] No Take 2 mLs (0.5 mg total) by nebulization in the morning and at bedtime. Karie Schwalbe, MD  Taking Active Spouse/Significant Other           Med Note Sharia Reeve   Fri Aug 02, 2023  3:49 PM) prn  carvedilol (COREG) 3.125 MG tablet [id number] No TAKE ONE TABLET BY MOUTH TWICE A DAY WITH A MEAL Nahser, Deloris Ping, MD 08/01/2023 Active Spouse/Significant Other  furosemide (LASIX) 20 MG tablet [id number] No Take 1 tablet (20 mg total) by mouth daily. Dyann Kief, PA-C 08/01/2023 Active Spouse/Significant Other  gabapentin (NEURONTIN) 400 MG capsule [id number] No Take 1-2 capsules (400-800 mg total) by mouth at bedtime. Tillman Abide I, MD 08/01/2023 Active Spouse/Significant Other  ipratropium-albuterol (DUONEB) 0.5-2.5 (3) MG/3ML SOLN [id number] No Take 3 ml by nebulization 3-4 times daily as needed Karie Schwalbe, MD Past Week Active Spouse/Significant Other  losartan (COZAAR) 25 MG tablet [id number] No TAKE ONE HALF (1/2) TABLET BY MOUTH DAILY  Patient taking differently: Take 12.5 mg by mouth daily.   Nahser, Deloris Ping, MD 08/01/2023 Active Spouse/Significant Other  Multiple Vitamin (MULTIVITAMIN WITH MINERALS) TABS tablet [id number] No Take 1 tablet by mouth daily. Alwyn Ren, MD 08/01/2023 Active Spouse/Significant Other  oseltamivir (TAMIFLU) 30 MG capsule [id number]  Take 1 capsule (30 mg total) by mouth 2 (two) times daily for 5 doses. Wouk, Wilfred Curtis, MD  Active   pantoprazole (PROTONIX) 40 MG tablet [id number] No Take 1 tablet (40 mg total) by mouth daily. Merwyn Katos, MD 08/01/2023 Active Spouse/Significant Other  potassium chloride (KLOR-CON) 10 MEQ tablet [id number] No TAKE ONE TABLET BY MOUTH ONCE A DAY Dyann Kief, PA-C 08/01/2023 Active Spouse/Significant Other  QUEtiapine (SEROQUEL) 25 MG tablet [id number] No TAKE 1 TABLET BY MOUTH EVERY NIGHT AT BEDTIME Karie Schwalbe, MD 08/01/2023 Bedtime Active Spouse/Significant Other  simvastatin (ZOCOR) 40 MG tablet [id number] No TAKE ONE TABLET BY MOUTH EVERY NIGHT AT BEDTIME Karie Schwalbe, MD  08/01/2023 Bedtime Active Spouse/Significant Other  sucralfate (CARAFATE) 1 g tablet [id number] No Take 1 tablet (1 g total) by mouth 4 (four) times daily. Merwyn Katos, MD 08/01/2023 Active Spouse/Significant Other  tamsulosin (FLOMAX) 0.4 MG CAPS capsule [id number] No Take 1 capsule (0.4 mg total) by mouth daily after supper. Tillman Abide I, MD 08/01/2023 Active Spouse/Significant Other  traMADol (ULTRAM) 50 MG tablet [id number] No Take 1 tablet (50 mg total) by mouth daily as needed. Karie Schwalbe, MD Past Week Active Spouse/Significant Other  triamcinolone cream (KENALOG) 0.1 % [id number] No APPLY ONE APPLICATION TOPICALLY TWO TIMES DAILY AS NEEDED Karie Schwalbe, MD Past Week Active Spouse/Significant Other            Home Care and Equipment/Supplies: Were Home Health Services Ordered?: Yes Name of Home Health Agency:: [facility] Has Agency set up a time to come to your home?: Yes First Home Health Visit Date: 08/08/23 Any new equipment or medical supplies ordered?: No  Functional Questionnaire: Do you need assistance with bathing/showering or dressing?: No Do you need assistance with meal preparation?: No Do you need assistance with eating?: No Do you have difficulty maintaining continence: No Do you need assistance with getting out of bed/getting out of a chair/moving?: No Do you have difficulty managing or taking your medications?: No  Follow up appointments reviewed: PCP Follow-up appointment confirmed?: No MD Provider Line [phone] Given: No (Patient talked to the provider and he has an appointment in February and Dr. Alphonsus Sias stated he didn't need to come in sooner) Specialist Hospital Follow-up appointment confirmed?: NA Do you need transportation to your follow-up appointment?: No Do you understand care options if your condition(s) worsen?: Yes-patient verbalized understanding  SDOH Interventions Today    Flowsheet Row Most Recent Value  SDOH  Interventions   Food Insecurity Interventions Intervention Not Indicated  Housing Interventions Intervention Not Indicated  Transportation Interventions Intervention Not Indicated  Utilities Interventions Intervention Not Indicated      Interventions Today    Flowsheet Row Most Recent Value  General Interventions   General Interventions Discussed/Reviewed General Interventions Discussed  Exercise Interventions   Exercise Discussed/Reviewed Physical Activity  Physical Activity Discussed/Reviewed Physical Activity Reviewed  Pharmacy Interventions   Pharmacy Dicussed/Reviewed Medications and their functions       TOC completed today. Talked to the patient's spouse. She states she had a difficult time getting the Tamiflu but he has it now and he is on his second day of dosing. The patient feels good, he was washing dishes and trying to help with the laundry. HHPT will start after Christmas to increase strength. No other concerns. He has an appointment in February with the PCP and the patient and the provider do not feel that it is necessary to move up the appointment.   Deidre Ala, RN Medical illustrator [facility] [phone]

## 2023-08-07 LAB — CULTURE, BLOOD (ROUTINE X 2)
Culture: NO GROWTH
Culture: NO GROWTH

## 2023-08-08 ENCOUNTER — Telehealth: Payer: Self-pay

## 2023-08-08 NOTE — Telephone Encounter (Signed)
Copied from CRM [id number]. Topic: Clinical - Home Health Verbal Orders >> Aug 08, 2023 11:16 AM Myrtice Lauth wrote: Caller/Agency:  [facility]  Callback Number: [phone] Service Requested: Physical Therapy Frequency: 2 week 3, 1 week 3 Any new concerns about the patient? No

## 2023-08-09 NOTE — Telephone Encounter (Signed)
Verbal orders given to [facility]

## 2023-08-19 ENCOUNTER — Ambulatory Visit: Payer: PPO | Admitting: Internal Medicine

## 2023-08-22 ENCOUNTER — Ambulatory Visit (INDEPENDENT_AMBULATORY_CARE_PROVIDER_SITE_OTHER): Payer: PPO | Admitting: Internal Medicine

## 2023-08-22 ENCOUNTER — Encounter: Payer: Self-pay | Admitting: Internal Medicine

## 2023-08-22 VITALS — BP 122/66 | HR 74 | Temp 97.8°F | Ht 66.0 in | Wt 140.8 lb

## 2023-08-22 DIAGNOSIS — I5042 Chronic combined systolic (congestive) and diastolic (congestive) heart failure: Secondary | ICD-10-CM | POA: Diagnosis not present

## 2023-08-22 DIAGNOSIS — J449 Chronic obstructive pulmonary disease, unspecified: Secondary | ICD-10-CM

## 2023-08-22 DIAGNOSIS — J09X1 Influenza due to identified novel influenza A virus with pneumonia: Secondary | ICD-10-CM | POA: Diagnosis not present

## 2023-08-22 NOTE — Assessment & Plan Note (Signed)
 Compensated On carvedilol 3/125 bid, furosemide 20 dail, losartan 25

## 2023-08-22 NOTE — Progress Notes (Signed)
 Subjective:    Patient ID: Leon Estes, male    DOB: 06-Sep-1930, 89 y.o.   MRN: [id number]  HPI Here for hospital follow up With wife  Woke with high fever and SOB Hospitalized 12/20-12/22 for flu Treated with tamiflu , prednisone  and oxygen with rapid improvement BP was low--but restarted meds after going home since BP was increasing  Sent home with PT Thought he had rattling in chest --so pushed up follow up  Some cough No SOB  Current Outpatient Medications on File Prior to Visit  Medication Sig Dispense Refill   acetaminophen  (TYLENOL ) 500 MG tablet Take 1,000 mg by mouth every 6 (six) hours as needed for moderate pain (pain score 4-6) or headache.     acetaminophen  (TYLENOL ) 650 MG CR tablet Take 1,300 mg by mouth every 8 (eight) hours as needed for pain.     aspirin  81 MG EC tablet Take 1 tablet (81 mg total) by mouth daily. Swallow whole. 30 tablet 0   budesonide  (PULMICORT ) 0.5 MG/2ML nebulizer solution Take 2 mLs (0.5 mg total) by nebulization in the morning and at bedtime. 200 mL 12   carvedilol  (COREG ) 3.125 MG tablet TAKE ONE TABLET BY MOUTH TWICE A DAY WITH A MEAL 180 tablet 3   furosemide  (LASIX ) 20 MG tablet Take 1 tablet (20 mg total) by mouth daily. 90 tablet 3   gabapentin  (NEURONTIN ) 400 MG capsule Take 1-2 capsules (400-800 mg total) by mouth at bedtime. 180 capsule 3   ipratropium-albuterol  (DUONEB) 0.5-2.5 (3) MG/3ML SOLN Take 3 ml by nebulization 3-4 times daily as needed 360 mL 11   losartan  (COZAAR ) 25 MG tablet TAKE ONE HALF (1/2) TABLET BY MOUTH DAILY (Patient taking differently: Take 12.5 mg by mouth daily.) 45 tablet 3   Multiple Vitamin (MULTIVITAMIN WITH MINERALS) TABS tablet Take 1 tablet by mouth daily.     pantoprazole  (PROTONIX ) 40 MG tablet Take 1 tablet (40 mg total) by mouth daily. 30 tablet 1   potassium chloride  (KLOR-CON ) 10 MEQ tablet TAKE ONE TABLET BY MOUTH ONCE A DAY 90 tablet 2   QUEtiapine  (SEROQUEL ) 25 MG tablet TAKE 1 TABLET BY  MOUTH EVERY NIGHT AT BEDTIME 30 tablet 11   simvastatin  (ZOCOR ) 40 MG tablet TAKE ONE TABLET BY MOUTH EVERY NIGHT AT BEDTIME 90 tablet 3   sucralfate  (CARAFATE ) 1 g tablet Take 1 tablet (1 g total) by mouth 4 (four) times daily. 120 tablet 1   tamsulosin  (FLOMAX ) 0.4 MG CAPS capsule Take 1 capsule (0.4 mg total) by mouth daily after supper. 100 capsule 3   traMADol  (ULTRAM ) 50 MG tablet Take 1 tablet (50 mg total) by mouth daily as needed. 30 tablet 0   triamcinolone  cream (KENALOG ) 0.1 % APPLY ONE APPLICATION TOPICALLY TWO TIMES DAILY AS NEEDED 454 g 1   No current facility-administered medications on file prior to visit.    Allergies  Allergen Reactions   Augmentin  [Amoxicillin -Pot Clavulanate] Rash   Bacitracin-Polymyxin B Swelling and Rash   Benzalkonium Chloride     Benzalkonium chloride is a quaternary ammonium antiseptic and disinfectant with actions and uses similar to those of other cationic surfactants. It is also used as an antimicrobial preservative for pharmaceutical products.   Neosporin [Neomycin-Bacitracin Zn-Polymyx] Rash   Sulfamethoxazole -Trimethoprim  Rash   Terbinafine And Related Rash    Past Medical History:  Diagnosis Date   Asthma    BPH (benign prostatic hypertrophy)    Cataract    Chronic renal disease, stage III (HCC)  Chronic sinusitis    COPD (chronic obstructive pulmonary disease) (HCC)    Coronary artery disease    2 stents   Gall stones    Hearing loss    DOES NOT WEAR HEARING AIDS   High cholesterol    Hypertension    Lactic acid acidosis 10/12/2021   Macular degeneration    right eye   Rash 12/04/2021   Septic shock (HCC) 10/10/2021    Past Surgical History:  Procedure Laterality Date   APPENDECTOMY     CORONARY ANGIOPLASTY WITH STENT PLACEMENT  1998   2 stents   TRANSURETHRAL RESECTION OF PROSTATE      Family History  Problem Relation Age of Onset   Diabetes Father    Heart disease Brother     Social History   Socioeconomic  History   Marital status: Married    Spouse name: Not on file   Number of children: 4   Years of education: Not on file   Highest education level: Not on file  Occupational History   Occupation: Drove truck and concrete work  Tobacco Use   Smoking status: Former    Current packs/day: 0.00    Types: Cigarettes    Quit date: 08/13/2005    Years since quitting: 18.0    Passive exposure: Past   Smokeless tobacco: Current    Types: Chew   Tobacco comments:    discussed stopping this (only once in a while)  Vaping Use   Vaping status: Never Used  Substance and Sexual Activity   Alcohol  use: No   Drug use: No   Sexual activity: Not Currently  Other Topics Concern   Not on file  Social History Narrative   No living will   Wife, then LouAnn daughter should make decisions   Would accept resuscitation   Would  not want tube feeds   Social Drivers of Health   Financial Resource Strain: Not on file  Food Insecurity: No Food Insecurity (08/06/2023)   Hunger Vital Sign    Worried About Running Out of Food in the Last Year: Never true    Ran Out of Food in the Last Year: Never true  Transportation Needs: No Transportation Needs (08/06/2023)   PRAPARE - Administrator, Civil Service (Medical): No    Lack of Transportation (Non-Medical): No  Physical Activity: Not on file  Stress: Not on file  Social Connections: Not on file  Intimate Partner Violence: Not At Risk (08/06/2023)   Humiliation, Afraid, Rape, and Kick questionnaire    Fear of Current or Ex-Partner: No    Emotionally Abused: No    Physically Abused: No    Sexually Abused: No   Review of Systems Eating okay No abdominal pain No chest pain Occasional pain in legs and arms Sleeps okay with tylenol  PM---discussed limiting this. Discussed melatonin instead    Objective:   Physical Exam Constitutional:      Appearance: Normal appearance.  Cardiovascular:     Rate and Rhythm: Normal rate and regular  rhythm.     Heart sounds: No murmur heard.    No gallop.  Pulmonary:     Effort: Pulmonary effort is normal.     Breath sounds: Normal breath sounds. No wheezing or rales.  Abdominal:     Palpations: Abdomen is soft.     Tenderness: There is no abdominal tenderness.  Musculoskeletal:     Cervical back: Neck supple.     Right lower leg: No edema.  Left lower leg: No edema.  Lymphadenopathy:     Cervical: No cervical adenopathy.  Neurological:     Mental Status: He is alert.            Assessment & Plan:

## 2023-08-22 NOTE — Assessment & Plan Note (Signed)
 Not exacerbated Has the duoneb and pulmicort bid regularly (though gets restless during the Rx)

## 2023-08-22 NOTE — Assessment & Plan Note (Signed)
 Did well with the tamiflu Seems back to baseline No Rx needed

## 2023-08-28 ENCOUNTER — Other Ambulatory Visit: Payer: Self-pay | Admitting: Internal Medicine

## 2023-08-29 ENCOUNTER — Other Ambulatory Visit: Payer: Self-pay | Admitting: Internal Medicine

## 2023-09-23 ENCOUNTER — Other Ambulatory Visit: Payer: Self-pay | Admitting: Internal Medicine

## 2023-09-28 ENCOUNTER — Other Ambulatory Visit: Payer: Self-pay | Admitting: Internal Medicine

## 2023-09-28 ENCOUNTER — Other Ambulatory Visit: Payer: Self-pay | Admitting: Physician Assistant

## 2023-10-10 ENCOUNTER — Encounter: Payer: Self-pay | Admitting: Internal Medicine

## 2023-10-10 ENCOUNTER — Ambulatory Visit (INDEPENDENT_AMBULATORY_CARE_PROVIDER_SITE_OTHER): Payer: HMO | Admitting: Internal Medicine

## 2023-10-10 VITALS — BP 124/84 | HR 70 | Temp 97.6°F | Ht 66.0 in | Wt 147.0 lb

## 2023-10-10 DIAGNOSIS — J449 Chronic obstructive pulmonary disease, unspecified: Secondary | ICD-10-CM | POA: Diagnosis not present

## 2023-10-10 DIAGNOSIS — I5042 Chronic combined systolic (congestive) and diastolic (congestive) heart failure: Secondary | ICD-10-CM | POA: Diagnosis not present

## 2023-10-10 DIAGNOSIS — I25119 Atherosclerotic heart disease of native coronary artery with unspecified angina pectoris: Secondary | ICD-10-CM

## 2023-10-10 DIAGNOSIS — K811 Chronic cholecystitis: Secondary | ICD-10-CM

## 2023-10-10 MED ORDER — NITROGLYCERIN 0.4 MG SL SUBL
0.4000 mg | SUBLINGUAL_TABLET | SUBLINGUAL | 3 refills | Status: AC | PRN
Start: 2023-10-10 — End: ?

## 2023-10-10 NOTE — Progress Notes (Signed)
 Subjective:    Patient ID: Leon Estes, male    DOB: 1931/02/03, 89 y.o.   MRN: [id number]  HPI Here with wife for follow up of multiple chronic health conditions  Not feeling great Complains about his chest hurting a lot--worried about a heart attack (wife doesn't know whether to be worried about heart) The pain is mostly after eating---but not every time. Wife gives him sucralfate and this helps SOB every morning---better after first neb (gets three per day) No palpitations Some dizziness---no syncope  Eating okay Gained back 7# No ankle swelling---just gets night great toe pain (better if wife wraps them and with the gabapentin)  Current Outpatient Medications on File Prior to Visit  Medication Sig Dispense Refill   acetaminophen (TYLENOL) 500 MG tablet Take 1,000 mg by mouth every 6 (six) hours as needed for moderate pain (pain score 4-6) or headache.     acetaminophen (TYLENOL) 650 MG CR tablet Take 1,300 mg by mouth every 8 (eight) hours as needed for pain.     aspirin 81 MG EC tablet Take 1 tablet (81 mg total) by mouth daily. Swallow whole. 30 tablet 0   budesonide (PULMICORT) 0.5 MG/2ML nebulizer solution TAKE TWO MLS (0.5 MG TOTAL) BY NEBULIZATION IN THE MORNING AND AT BEDTIME 200 mL 12   carvedilol (COREG) 3.125 MG tablet TAKE ONE TABLET BY MOUTH TWICE A DAY WITH A MEAL 180 tablet 3   furosemide (LASIX) 20 MG tablet Take 1 tablet (20 mg total) by mouth daily. 90 tablet 3   gabapentin (NEURONTIN) 400 MG capsule Take 1-2 capsules (400-800 mg total) by mouth at bedtime. 180 capsule 3   ipratropium-albuterol (DUONEB) 0.5-2.5 (3) MG/3ML SOLN Take 3 ml by nebulization 3-4 times daily as needed 360 mL 11   losartan (COZAAR) 25 MG tablet TAKE ONE HALF (1/2) TABLET BY MOUTH DAILY (Patient taking differently: Take 12.5 mg by mouth daily.) 45 tablet 3   Multiple Vitamin (MULTIVITAMIN WITH MINERALS) TABS tablet Take 1 tablet by mouth daily.     pantoprazole (PROTONIX) 40 MG tablet  TAKE ONE TABLET (40 MG TOTAL) BY MOUTH DAILY. 30 tablet 11   potassium chloride (KLOR-CON) 10 MEQ tablet TAKE ONE TABLET BY MOUTH ONCE A DAY 90 tablet 1   QUEtiapine (SEROQUEL) 25 MG tablet TAKE ONE TABLET BY MOUTH EVERY NIGHT AT BEDTIME 30 tablet 11   simvastatin (ZOCOR) 40 MG tablet TAKE ONE TABLET BY MOUTH EVERY NIGHT AT BEDTIME 90 tablet 3   sucralfate (CARAFATE) 1 g tablet Take 1 tablet (1 g total) by mouth 4 (four) times daily. 120 tablet 1   tamsulosin (FLOMAX) 0.4 MG CAPS capsule TAKE ONE CAPSULE BY MOUTH ONCE DAILY 100 capsule 3   traMADol (ULTRAM) 50 MG tablet Take 1 tablet (50 mg total) by mouth daily as needed. 30 tablet 0   triamcinolone cream (KENALOG) 0.1 % APPLY ONE APPLICATION TOPICALLY TWO TIMES DAILY AS NEEDED 454 g 1   No current facility-administered medications on file prior to visit.    Allergies  Allergen Reactions   Augmentin [Amoxicillin-Pot Clavulanate] Rash   Bacitracin-Polymyxin B Swelling and Rash   Benzalkonium Chloride     "Benzalkonium chloride is a quaternary ammonium antiseptic and disinfectant with actions and uses similar to those of other cationic surfactants. It is also used as an antimicrobial preservative for pharmaceutical products."   Neosporin [Neomycin-Bacitracin Zn-Polymyx] Rash   Sulfamethoxazole-Trimethoprim Rash   Terbinafine And Related Rash    Past Medical History:  Diagnosis  Date   Asthma    BPH (benign prostatic hypertrophy)    Cataract    Chronic renal disease, stage III (HCC)    Chronic sinusitis    COPD (chronic obstructive pulmonary disease) (HCC)    Coronary artery disease    2 stents   Gall stones    Hearing loss    DOES NOT WEAR HEARING AIDS   High cholesterol    Hypertension    Lactic acid acidosis 10/12/2021   Macular degeneration    right eye   Rash 12/04/2021   Septic shock (HCC) 10/10/2021    Past Surgical History:  Procedure Laterality Date   APPENDECTOMY     CORONARY ANGIOPLASTY WITH STENT PLACEMENT  1998    2 stents   TRANSURETHRAL RESECTION OF PROSTATE      Family History  Problem Relation Age of Onset   Diabetes Father    Heart disease Brother     Social History   Socioeconomic History   Marital status: Married    Spouse name: Not on file   Number of children: 4   Years of education: Not on file   Highest education level: Not on file  Occupational History   Occupation: Drove truck and concrete work  Tobacco Use   Smoking status: Former    Current packs/day: 0.00    Types: Cigarettes    Quit date: 08/13/2005    Years since quitting: 18.1    Passive exposure: Past   Smokeless tobacco: Current    Types: Chew   Tobacco comments:    discussed stopping this (only once in a while)  Vaping Use   Vaping status: Never Used  Substance and Sexual Activity   Alcohol use: No   Drug use: No   Sexual activity: Not Currently  Other Topics Concern   Not on file  Social History Narrative   No living will   Wife, then LouAnn daughter should make decisions   Would accept resuscitation   Would  not want tube feeds   Social Drivers of Health   Financial Resource Strain: Not on file  Food Insecurity: No Food Insecurity (08/06/2023)   Hunger Vital Sign    Worried About Running Out of Food in the Last Year: Never true    Ran Out of Food in the Last Year: Never true  Transportation Needs: No Transportation Needs (08/06/2023)   PRAPARE - Administrator, Civil Service (Medical): No    Lack of Transportation (Non-Medical): No  Physical Activity: Not on file  Stress: Not on file  Social Connections: Not on file  Intimate Partner Violence: Not At Risk (08/06/2023)   Humiliation, Afraid, Rape, and Kick questionnaire    Fear of Current or Ex-Partner: No    Emotionally Abused: No    Physically Abused: No    Sexually Abused: No   Review of Systems Sleeps reasonably well--nocturia but uses urinal Bowels move okay Right shoulder pain    Objective:   Physical  Exam Constitutional:      Appearance: Normal appearance.  Cardiovascular:     Rate and Rhythm: Normal rate and regular rhythm.     Heart sounds:     No gallop.     Comments: Prominent S3 gallop Pulmonary:     Effort: Pulmonary effort is normal.     Breath sounds: Normal breath sounds. No wheezing or rales.  Musculoskeletal:     Cervical back: Neck supple.     Right lower leg: No edema.  Left lower leg: No edema.  Lymphadenopathy:     Cervical: No cervical adenopathy.  Neurological:     Mental Status: He is alert.  Psychiatric:     Comments: Gets anxious but pretty normal now            Assessment & Plan:

## 2023-10-10 NOTE — Assessment & Plan Note (Signed)
 Hard to tell if his chest pain could be anginal Will give nitroglycerin for prn use

## 2023-10-10 NOTE — Assessment & Plan Note (Signed)
 Prominent S3 but no fluid overload On carvedilol 3.125 bid, furosemide 20mg  daily, losartain 12.5 mg daily

## 2023-10-10 NOTE — Assessment & Plan Note (Signed)
 Could be the reason for post prandial pain Also reflux esophagitis Sucralfate helps

## 2023-10-10 NOTE — Assessment & Plan Note (Signed)
 Gets SOB in AM but budesonide/albuterol nebs help

## 2023-10-14 ENCOUNTER — Other Ambulatory Visit: Payer: Self-pay | Admitting: Internal Medicine

## 2023-10-14 NOTE — Telephone Encounter (Signed)
 Last filled 08-29-23 #30 Last OV 10-10-23 Next OV 01-09-24 [facility]

## 2023-10-18 ENCOUNTER — Encounter: Payer: Self-pay | Admitting: Internal Medicine

## 2023-10-18 ENCOUNTER — Ambulatory Visit: Admitting: Internal Medicine

## 2023-10-18 VITALS — BP 134/70 | HR 75 | Temp 98.5°F | Ht 66.0 in | Wt 144.0 lb

## 2023-10-18 DIAGNOSIS — H9313 Tinnitus, bilateral: Secondary | ICD-10-CM | POA: Diagnosis not present

## 2023-10-18 DIAGNOSIS — H9319 Tinnitus, unspecified ear: Secondary | ICD-10-CM | POA: Insufficient documentation

## 2023-10-18 NOTE — Assessment & Plan Note (Signed)
 No evidence of serious problem Cerumen impaction is significant--shouldn't cause tinnitus but will flush to see if that helps

## 2023-10-18 NOTE — Progress Notes (Signed)
 Subjective:    Patient ID: Leon Estes, male    DOB: Mar 06, 1931, 89 y.o.   MRN: [id number]  HPI Here with wife due to ringing in his ears  Started a couple of weeks ago Off and on Some frontal headache yesterday--but did resolve. This prompted the appt being made Hearing is not good--but no apparent change Feels unsteady walking--and has complained about dizziness (not clearly vertigo)  Current Outpatient Medications on File Prior to Visit  Medication Sig Dispense Refill   acetaminophen (TYLENOL) 500 MG tablet Take 1,000 mg by mouth every 6 (six) hours as needed for moderate pain (pain score 4-6) or headache.     acetaminophen (TYLENOL) 650 MG CR tablet Take 1,300 mg by mouth every 8 (eight) hours as needed for pain.     aspirin 81 MG EC tablet Take 1 tablet (81 mg total) by mouth daily. Swallow whole. 30 tablet 0   budesonide (PULMICORT) 0.5 MG/2ML nebulizer solution TAKE TWO MLS (0.5 MG TOTAL) BY NEBULIZATION IN THE MORNING AND AT BEDTIME 200 mL 12   carvedilol (COREG) 3.125 MG tablet TAKE ONE TABLET BY MOUTH TWICE A DAY WITH A MEAL 180 tablet 3   furosemide (LASIX) 20 MG tablet Take 1 tablet (20 mg total) by mouth daily. 90 tablet 3   gabapentin (NEURONTIN) 400 MG capsule Take 1-2 capsules (400-800 mg total) by mouth at bedtime. 180 capsule 3   ipratropium-albuterol (DUONEB) 0.5-2.5 (3) MG/3ML SOLN Take 3 ml by nebulization 3-4 times daily as needed 360 mL 11   losartan (COZAAR) 25 MG tablet TAKE ONE HALF (1/2) TABLET BY MOUTH DAILY (Patient taking differently: Take 12.5 mg by mouth daily.) 45 tablet 3   Multiple Vitamin (MULTIVITAMIN WITH MINERALS) TABS tablet Take 1 tablet by mouth daily.     nitroGLYCERIN (NITROSTAT) 0.4 MG SL tablet Place 1 tablet (0.4 mg total) under the tongue every 5 (five) minutes as needed for chest pain. 25 tablet 3   pantoprazole (PROTONIX) 40 MG tablet TAKE ONE TABLET (40 MG TOTAL) BY MOUTH DAILY. 30 tablet 11   potassium chloride (KLOR-CON) 10 MEQ  tablet TAKE ONE TABLET BY MOUTH ONCE A DAY 90 tablet 1   QUEtiapine (SEROQUEL) 25 MG tablet TAKE ONE TABLET BY MOUTH EVERY NIGHT AT BEDTIME 30 tablet 11   simvastatin (ZOCOR) 40 MG tablet TAKE ONE TABLET BY MOUTH EVERY NIGHT AT BEDTIME 90 tablet 3   sucralfate (CARAFATE) 1 g tablet Take 1 tablet (1 g total) by mouth 4 (four) times daily. 120 tablet 1   tamsulosin (FLOMAX) 0.4 MG CAPS capsule TAKE ONE CAPSULE BY MOUTH ONCE DAILY 100 capsule 3   traMADol (ULTRAM) 50 MG tablet Take 1 tablet (50 mg total) by mouth daily as needed. 30 tablet 0   triamcinolone cream (KENALOG) 0.1 % APPLY ONE APPLICATION TOPICALLY TWO TIMES DAILY AS NEEDED 454 g 1   No current facility-administered medications on file prior to visit.    Allergies  Allergen Reactions   Augmentin [Amoxicillin-Pot Clavulanate] Rash   Bacitracin-Polymyxin B Swelling and Rash   Benzalkonium Chloride     "Benzalkonium chloride is a quaternary ammonium antiseptic and disinfectant with actions and uses similar to those of other cationic surfactants. It is also used as an antimicrobial preservative for pharmaceutical products."   Neosporin [Neomycin-Bacitracin Zn-Polymyx] Rash   Sulfamethoxazole-Trimethoprim Rash   Terbinafine And Related Rash    Past Medical History:  Diagnosis Date   Asthma    BPH (benign prostatic hypertrophy)  Cataract    Chronic renal disease, stage III (HCC)    Chronic sinusitis    COPD (chronic obstructive pulmonary disease) (HCC)    Coronary artery disease    2 stents   Gall stones    Hearing loss    DOES NOT WEAR HEARING AIDS   High cholesterol    Hypertension    Lactic acid acidosis 10/12/2021   Macular degeneration    right eye   Rash 12/04/2021   Septic shock (HCC) 10/10/2021    Past Surgical History:  Procedure Laterality Date   APPENDECTOMY     CORONARY ANGIOPLASTY WITH STENT PLACEMENT  1998   2 stents   TRANSURETHRAL RESECTION OF PROSTATE      Family History  Problem Relation Age of  Onset   Diabetes Father    Heart disease Brother     Social History   Socioeconomic History   Marital status: Married    Spouse name: Not on file   Number of children: 4   Years of education: Not on file   Highest education level: Not on file  Occupational History   Occupation: Drove truck and concrete work  Tobacco Use   Smoking status: Former    Current packs/day: 0.00    Types: Cigarettes    Quit date: 08/13/2005    Years since quitting: 18.1    Passive exposure: Past   Smokeless tobacco: Current    Types: Chew   Tobacco comments:    discussed stopping this (only once in a while)  Vaping Use   Vaping status: Never Used  Substance and Sexual Activity   Alcohol use: No   Drug use: No   Sexual activity: Not Currently  Other Topics Concern   Not on file  Social History Narrative   No living will   Wife, then LouAnn daughter should make decisions   Would accept resuscitation   Would  not want tube feeds   Social Drivers of Health   Financial Resource Strain: Not on file  Food Insecurity: No Food Insecurity (08/06/2023)   Hunger Vital Sign    Worried About Running Out of Food in the Last Year: Never true    Ran Out of Food in the Last Year: Never true  Transportation Needs: No Transportation Needs (08/06/2023)   PRAPARE - Administrator, Civil Service (Medical): No    Lack of Transportation (Non-Medical): No  Physical Activity: Not on file  Stress: Not on file  Social Connections: Not on file  Intimate Partner Violence: Not At Risk (08/06/2023)   Humiliation, Afraid, Rape, and Kick questionnaire    Fear of Current or Ex-Partner: No    Emotionally Abused: No    Physically Abused: No    Sexually Abused: No   Review of Systems Sleeping okay Eating fine     Objective:   Physical Exam HENT:     Right Ear: There is impacted cerumen.     Left Ear: There is impacted cerumen.  Eyes:     Extraocular Movements: Extraocular movements intact.      Comments: Slight right exotropia  Neurological:     Coordination: Romberg sign negative.     Gait: Gait is intact.            Assessment & Plan:

## 2023-10-25 ENCOUNTER — Other Ambulatory Visit: Payer: Self-pay | Admitting: Cardiovascular Disease

## 2023-11-13 ENCOUNTER — Ambulatory Visit (INDEPENDENT_AMBULATORY_CARE_PROVIDER_SITE_OTHER): Admitting: Internal Medicine

## 2023-11-13 ENCOUNTER — Encounter: Payer: Self-pay | Admitting: Internal Medicine

## 2023-11-13 VITALS — BP 130/82 | HR 66 | Temp 98.2°F | Ht 66.0 in | Wt 145.0 lb

## 2023-11-13 DIAGNOSIS — R21 Rash and other nonspecific skin eruption: Secondary | ICD-10-CM | POA: Insufficient documentation

## 2023-11-13 NOTE — Assessment & Plan Note (Signed)
 Looks viral No foot/mouth lesions though Almost looks like molluscum--but no umbilication and shouldn't be on hands/palms  Is going to derm next week Not bacterial Use the triamcinolone for itching  Okay to try claritin or zyrtec--avoid benedryl

## 2023-11-13 NOTE — Progress Notes (Signed)
 Subjective:    Patient ID: Leon Estes, male    DOB: 08-13-1931, 89 y.o.   MRN: [id number]  HPI Here with wife due to rash on hands  Rash on hands for about a week Little blister No known exposures Steroid cream not helping Tried OTC poison oak lotion  No history of syphilis  Current Outpatient Medications on File Prior to Visit  Medication Sig Dispense Refill   acetaminophen (TYLENOL) 500 MG tablet Take 1,000 mg by mouth every 6 (six) hours as needed for moderate pain (pain score 4-6) or headache.     acetaminophen (TYLENOL) 650 MG CR tablet Take 1,300 mg by mouth every 8 (eight) hours as needed for pain.     aspirin 81 MG EC tablet Take 1 tablet (81 mg total) by mouth daily. Swallow whole. 30 tablet 0   budesonide (PULMICORT) 0.5 MG/2ML nebulizer solution TAKE TWO MLS (0.5 MG TOTAL) BY NEBULIZATION IN THE MORNING AND AT BEDTIME 200 mL 12   carvedilol (COREG) 3.125 MG tablet TAKE ONE TABLET BY MOUTH TWICE A DAY WITH A MEAL 180 tablet 3   furosemide (LASIX) 20 MG tablet Take 1 tablet (20 mg total) by mouth daily. 90 tablet 3   gabapentin (NEURONTIN) 400 MG capsule Take 1-2 capsules (400-800 mg total) by mouth at bedtime. 180 capsule 3   ipratropium-albuterol (DUONEB) 0.5-2.5 (3) MG/3ML SOLN Take 3 ml by nebulization 3-4 times daily as needed 360 mL 11   losartan (COZAAR) 25 MG tablet Take 0.5 tablets (12.5 mg total) by mouth daily. 45 tablet 2   Multiple Vitamin (MULTIVITAMIN WITH MINERALS) TABS tablet Take 1 tablet by mouth daily.     nitroGLYCERIN (NITROSTAT) 0.4 MG SL tablet Place 1 tablet (0.4 mg total) under the tongue every 5 (five) minutes as needed for chest pain. 25 tablet 3   pantoprazole (PROTONIX) 40 MG tablet TAKE ONE TABLET (40 MG TOTAL) BY MOUTH DAILY. 30 tablet 11   potassium chloride (KLOR-CON) 10 MEQ tablet TAKE ONE TABLET BY MOUTH ONCE A DAY 90 tablet 1   QUEtiapine (SEROQUEL) 25 MG tablet TAKE ONE TABLET BY MOUTH EVERY NIGHT AT BEDTIME 30 tablet 11    simvastatin (ZOCOR) 40 MG tablet TAKE ONE TABLET BY MOUTH EVERY NIGHT AT BEDTIME 90 tablet 3   sucralfate (CARAFATE) 1 g tablet Take 1 tablet (1 g total) by mouth 4 (four) times daily. 120 tablet 1   tamsulosin (FLOMAX) 0.4 MG CAPS capsule TAKE ONE CAPSULE BY MOUTH ONCE DAILY 100 capsule 3   traMADol (ULTRAM) 50 MG tablet Take 1 tablet (50 mg total) by mouth daily as needed. 30 tablet 0   triamcinolone cream (KENALOG) 0.1 % APPLY ONE APPLICATION TOPICALLY TWO TIMES DAILY AS NEEDED 454 g 1   No current facility-administered medications on file prior to visit.    Allergies  Allergen Reactions   Augmentin [Amoxicillin-Pot Clavulanate] Rash   Bacitracin-Polymyxin B Swelling and Rash   Benzalkonium Chloride     "Benzalkonium chloride is a quaternary ammonium antiseptic and disinfectant with actions and uses similar to those of other cationic surfactants. It is also used as an antimicrobial preservative for pharmaceutical products."   Neosporin [Neomycin-Bacitracin Zn-Polymyx] Rash   Sulfamethoxazole-Trimethoprim Rash   Terbinafine And Related Rash    Past Medical History:  Diagnosis Date   Asthma    BPH (benign prostatic hypertrophy)    Cataract    Chronic renal disease, stage III (HCC)    Chronic sinusitis    COPD (  chronic obstructive pulmonary disease) (HCC)    Coronary artery disease    2 stents   Gall stones    Hearing loss    DOES NOT WEAR HEARING AIDS   High cholesterol    Hypertension    Lactic acid acidosis 10/12/2021   Macular degeneration    right eye   Rash 12/04/2021   Septic shock (HCC) 10/10/2021    Past Surgical History:  Procedure Laterality Date   APPENDECTOMY     CORONARY ANGIOPLASTY WITH STENT PLACEMENT  1998   2 stents   TRANSURETHRAL RESECTION OF PROSTATE      Family History  Problem Relation Age of Onset   Diabetes Father    Heart disease Brother     Social History   Socioeconomic History   Marital status: Married    Spouse name: Not on file    Number of children: 4   Years of education: Not on file   Highest education level: Not on file  Occupational History   Occupation: Drove truck and concrete work  Tobacco Use   Smoking status: Former    Current packs/day: 0.00    Types: Cigarettes    Quit date: 08/13/2005    Years since quitting: 18.2    Passive exposure: Past   Smokeless tobacco: Current    Types: Chew   Tobacco comments:    discussed stopping this (only once in a while)  Vaping Use   Vaping status: Never Used  Substance and Sexual Activity   Alcohol use: No   Drug use: No   Sexual activity: Not Currently  Other Topics Concern   Not on file  Social History Narrative   No living will   Wife, then LouAnn daughter should make decisions   Would accept resuscitation   Would  not want tube feeds   Social Drivers of Health   Financial Resource Strain: Not on file  Food Insecurity: No Food Insecurity (08/06/2023)   Hunger Vital Sign    Worried About Running Out of Food in the Last Year: Never true    Ran Out of Food in the Last Year: Never true  Transportation Needs: No Transportation Needs (08/06/2023)   PRAPARE - Administrator, Civil Service (Medical): No    Lack of Transportation (Non-Medical): No  Physical Activity: Not on file  Stress: Not on file  Social Connections: Not on file  Intimate Partner Violence: Not At Risk (08/06/2023)   Humiliation, Afraid, Rape, and Kick questionnaire    Fear of Current or Ex-Partner: No    Emotionally Abused: No    Physically Abused: No    Sexually Abused: No   Review of Systems No fever Does have derm appointment next week     Objective:   Physical Exam Skin:    Comments: Clumps of small papules--no umbilication More on right palm (with coalescence) than left  No foot or oral lesions            Assessment & Plan:

## 2023-11-13 NOTE — Patient Instructions (Signed)
 Use the triamcinolone cream for itching. You can also use over the counter loratadine 10mg  or cetirizine 10mg  daily if needed. We will see what the dermatologist thinks

## 2023-11-19 ENCOUNTER — Telehealth: Payer: Self-pay | Admitting: Internal Medicine

## 2023-11-19 ENCOUNTER — Other Ambulatory Visit: Payer: Self-pay | Admitting: Internal Medicine

## 2023-11-19 DIAGNOSIS — L301 Dyshidrosis [pompholyx]: Secondary | ICD-10-CM | POA: Diagnosis not present

## 2023-11-19 MED ORDER — SUCRALFATE 1 G PO TABS: 1.0000 g | ORAL_TABLET | Freq: Four times a day (QID) | ORAL | 5 refills | Status: DC

## 2023-11-19 NOTE — Telephone Encounter (Signed)
 Copied from CRM [id number]. Topic: General - Other >> Nov 19, 2023 12:25 PM Martinique E wrote: Reason for CRM: Patient's spouse called in stating that the pharmacy sent over a fax about patient's sucralfate (CARAFATE) 1 g tablet medication. Spouse just wants to make sure if this fax was received by the clinic. Callback number for spouse is [phone] to confirm.

## 2023-11-19 NOTE — Telephone Encounter (Signed)
 Prescription Request    11/19/2023  LOV: 11/13/2023  What is the name of the medication or equipment? sucralfate (CARAFATE) 1 g tablet  Have you contacted your pharmacy to request a refill? Yes   Which pharmacy would you like this sent to?  [facility] - [place], [place] - [address] [place] [address] Phone: [phone] Fax: [phone]  Patient notified that their request is being sent to the clinical staff for review and that they should receive a response within 2 business days.   Please advise at [facility] [phone]

## 2023-11-19 NOTE — Telephone Encounter (Signed)
 Last filled 10-14-23 #30 Last OV 11-13-23 Next OV 01-09-24 [facility]

## 2023-11-19 NOTE — Telephone Encounter (Signed)
 Tried to call pt's wife. I have not received a fax. Is he needing a refill?

## 2023-11-19 NOTE — Telephone Encounter (Signed)
 Rx sent to pharmacy

## 2023-11-19 NOTE — Telephone Encounter (Signed)
 Looks like pt's wife called back and another message was opened.

## 2023-12-20 ENCOUNTER — Other Ambulatory Visit: Payer: Self-pay | Admitting: Internal Medicine

## 2023-12-23 ENCOUNTER — Other Ambulatory Visit: Payer: Self-pay | Admitting: Internal Medicine

## 2023-12-23 NOTE — Telephone Encounter (Signed)
 Last filled 11-20-23 #30 Last OV 11-13-23 Next OV 01-09-24 [facility]

## 2024-01-09 ENCOUNTER — Ambulatory Visit: Payer: HMO | Admitting: Internal Medicine

## 2024-01-09 ENCOUNTER — Encounter: Payer: Self-pay | Admitting: Internal Medicine

## 2024-01-09 VITALS — BP 108/58 | HR 74 | Temp 98.6°F | Ht 66.0 in | Wt 148.0 lb

## 2024-01-09 DIAGNOSIS — J449 Chronic obstructive pulmonary disease, unspecified: Secondary | ICD-10-CM | POA: Diagnosis not present

## 2024-01-09 DIAGNOSIS — I5042 Chronic combined systolic (congestive) and diastolic (congestive) heart failure: Secondary | ICD-10-CM

## 2024-01-09 DIAGNOSIS — K811 Chronic cholecystitis: Secondary | ICD-10-CM | POA: Diagnosis not present

## 2024-01-09 NOTE — Assessment & Plan Note (Signed)
 Stable chronic DOE Gets the budesonide  nebs bid and duoneb one to several times daily

## 2024-01-09 NOTE — Progress Notes (Signed)
 Subjective:    Patient ID: Leon Estes, male    DOB: 11-20-1930, 89 y.o.   MRN: [id number]  HPI Here with wife for follow up of chronic health conditions  Breathing still not good Not much cough Some wheezing--nebs help but he is only using it in AM--and then prn Some SOB walking short distances No chest pain No edema Some dizziness--but no syncope  No recent abdominal pain Still on sucralfate   No problems with sleep/mood with the quetiapine  No agitation while taking this  Current Outpatient Medications on File Prior to Visit  Medication Sig Dispense Refill   acetaminophen  (TYLENOL ) 500 MG tablet Take 1,000 mg by mouth every 6 (six) hours as needed for moderate pain (pain score 4-6) or headache.     acetaminophen  (TYLENOL ) 650 MG CR tablet Take 1,300 mg by mouth every 8 (eight) hours as needed for pain.     aspirin  81 MG EC tablet Take 1 tablet (81 mg total) by mouth daily. Swallow whole. 30 tablet 0   budesonide  (PULMICORT ) 0.5 MG/2ML nebulizer solution TAKE TWO MLS (0.5 MG TOTAL) BY NEBULIZATION IN THE MORNING AND AT BEDTIME 200 mL 12   carvedilol  (COREG ) 3.125 MG tablet TAKE ONE TABLET BY MOUTH TWICE A DAY WITH A MEAL 180 tablet 3   furosemide  (LASIX ) 20 MG tablet Take 1 tablet (20 mg total) by mouth daily. 90 tablet 3   gabapentin  (NEURONTIN ) 400 MG capsule Take 1-2 capsules (400-800 mg total) by mouth at bedtime. 180 capsule 3   ipratropium-albuterol  (DUONEB) 0.5-2.5 (3) MG/3ML SOLN Take 3 ml by nebulization 3-4 times daily as needed 360 mL 11   losartan  (COZAAR ) 25 MG tablet Take 0.5 tablets (12.5 mg total) by mouth daily. 45 tablet 2   Multiple Vitamin (MULTIVITAMIN WITH MINERALS) TABS tablet Take 1 tablet by mouth daily.     nitroGLYCERIN  (NITROSTAT ) 0.4 MG SL tablet Place 1 tablet (0.4 mg total) under the tongue every 5 (five) minutes as needed for chest pain. 25 tablet 3   pantoprazole  (PROTONIX ) 40 MG tablet TAKE ONE TABLET (40 MG TOTAL) BY MOUTH DAILY. 30 tablet 11    potassium chloride  (KLOR-CON ) 10 MEQ tablet TAKE ONE TABLET BY MOUTH ONCE A DAY 90 tablet 1   QUEtiapine  (SEROQUEL ) 25 MG tablet TAKE ONE TABLET BY MOUTH EVERY NIGHT AT BEDTIME 30 tablet 11   simvastatin  (ZOCOR ) 40 MG tablet TAKE ONE TABLET BY MOUTH EVERY NIGHT AT BEDTIME 90 tablet 3   sucralfate  (CARAFATE ) 1 g tablet Take 1 tablet (1 g total) by mouth 4 (four) times daily. 120 tablet 5   tamsulosin  (FLOMAX ) 0.4 MG CAPS capsule TAKE ONE CAPSULE BY MOUTH ONCE DAILY 100 capsule 3   traMADol  (ULTRAM ) 50 MG tablet Take 1 tablet (50 mg total) by mouth 2 (two) times daily as needed. 30 tablet 0   triamcinolone  cream (KENALOG ) 0.1 % APPLY ONE APPLICATION TOPICALLY TWO TIMES DAILY AS NEEDED 454 g 1   No current facility-administered medications on file prior to visit.    Allergies  Allergen Reactions   Augmentin  [Amoxicillin -Pot Clavulanate] Rash   Bacitracin-Polymyxin B Swelling and Rash   Benzalkonium Chloride     "Benzalkonium chloride is a quaternary ammonium antiseptic and disinfectant with actions and uses similar to those of other cationic surfactants. It is also used as an antimicrobial preservative for pharmaceutical products."   Neosporin [Neomycin-Bacitracin Zn-Polymyx] Rash   Sulfamethoxazole -Trimethoprim  Rash   Terbinafine And Related Rash    Past Medical History:  Diagnosis Date   Asthma    BPH (benign prostatic hypertrophy)    Cataract    Chronic renal disease, stage III (HCC)    Chronic sinusitis    COPD (chronic obstructive pulmonary disease) (HCC)    Coronary artery disease    2 stents   Gall stones    Hearing loss    DOES NOT WEAR HEARING AIDS   High cholesterol    Hypertension    Lactic acid acidosis 10/12/2021   Macular degeneration    right eye   Rash 12/04/2021   Septic shock (HCC) 10/10/2021    Past Surgical History:  Procedure Laterality Date   APPENDECTOMY     CORONARY ANGIOPLASTY WITH STENT PLACEMENT  1998   2 stents   TRANSURETHRAL RESECTION OF  PROSTATE      Family History  Problem Relation Age of Onset   Diabetes Father    Heart disease Brother     Social History   Socioeconomic History   Marital status: Married    Spouse name: Not on file   Number of children: 4   Years of education: Not on file   Highest education level: Not on file  Occupational History   Occupation: Drove truck and concrete work  Tobacco Use   Smoking status: Former    Current packs/day: 0.00    Types: Cigarettes    Quit date: 08/13/2005    Years since quitting: 18.4    Passive exposure: Past   Smokeless tobacco: Current    Types: Chew   Tobacco comments:    discussed stopping this (only once in a while)  Vaping Use   Vaping status: Never Used  Substance and Sexual Activity   Alcohol  use: No   Drug use: No   Sexual activity: Not Currently  Other Topics Concern   Not on file  Social History Narrative   No living will   Wife, then LouAnn daughter should make decisions   Would accept resuscitation   Would  not want tube feeds   Social Drivers of Health   Financial Resource Strain: Not on file  Food Insecurity: No Food Insecurity (08/06/2023)   Hunger Vital Sign    Worried About Running Out of Food in the Last Year: Never true    Ran Out of Food in the Last Year: Never true  Transportation Needs: No Transportation Needs (08/06/2023)   PRAPARE - Administrator, Civil Service (Medical): No    Lack of Transportation (Non-Medical): No  Physical Activity: Not on file  Stress: Not on file  Social Connections: Not on file  Intimate Partner Violence: Not At Risk (08/06/2023)   Humiliation, Afraid, Rape, and Kick questionnaire    Fear of Current or Ex-Partner: No    Emotionally Abused: No    Physically Abused: No    Sexually Abused: No   Review of Systems Hand rash did go away---wife used OTC cream and it seemed to help Appetite is good Weight fairly stable Sleeps okay--in bed. 1 pillow. Did have one spell of awakening  SOB in AM. Then wife gives neb Rx    Objective:   Physical Exam Constitutional:      Appearance: Normal appearance.  Cardiovascular:     Rate and Rhythm: Normal rate and regular rhythm.     Heart sounds: No murmur heard.    No gallop.  Pulmonary:     Effort: Pulmonary effort is normal.     Breath sounds: No wheezing or rales.  Comments: Decreased breath sounds but clear Musculoskeletal:     Cervical back: Neck supple.     Right lower leg: No edema.     Left lower leg: No edema.  Lymphadenopathy:     Cervical: No cervical adenopathy.  Neurological:     Mental Status: He is alert.            Assessment & Plan:

## 2024-01-09 NOTE — Assessment & Plan Note (Signed)
 Has been quiet lately Sucralfate  for GERD/stomach

## 2024-01-09 NOTE — Assessment & Plan Note (Signed)
 Compensated on furosemide  20mg  daily, carvedilol  3.125 bid and losartan  25mg 

## 2024-01-23 ENCOUNTER — Other Ambulatory Visit: Payer: Self-pay | Admitting: Internal Medicine

## 2024-01-24 ENCOUNTER — Other Ambulatory Visit: Payer: Self-pay | Admitting: Family

## 2024-01-24 MED ORDER — TRAMADOL HCL 50 MG PO TABS: 50.0000 mg | ORAL_TABLET | Freq: Two times a day (BID) | ORAL | 0 refills | Status: DC | PRN

## 2024-01-24 NOTE — Telephone Encounter (Signed)
 Last filled 12-23-23 #30 Last OV 01-09-24 No Future OV [facility]

## 2024-02-17 ENCOUNTER — Other Ambulatory Visit: Payer: Self-pay | Admitting: Family

## 2024-02-25 ENCOUNTER — Other Ambulatory Visit: Payer: Self-pay | Admitting: Cardiovascular Disease

## 2024-02-26 ENCOUNTER — Other Ambulatory Visit: Payer: Self-pay | Admitting: Internal Medicine

## 2024-03-27 ENCOUNTER — Other Ambulatory Visit: Payer: Self-pay | Admitting: Internal Medicine

## 2024-03-27 NOTE — Telephone Encounter (Unsigned)
 Copied from CRM [id number]. Topic: Clinical - Medication Refill >> Mar 27, 2024  4:47 PM Jayma L wrote: Medication: traMADol  (ULTRAM ) 50 MG tablet  Has the patient contacted their pharmacy? Yes (Agent: If no, request that the patient contact the pharmacy for the refill. If patient does not wish to contact the pharmacy document the reason why and proceed with request.) (Agent: If yes, when and what did the pharmacy advise?)  This is the patient's preferred pharmacy:  [facility] - [place], [place] - [address] [place] [address] Phone: [phone] Fax: [phone]   Is this the correct pharmacy for this prescription? Yes If no, delete pharmacy and type the correct one.   Has the prescription been filled recently? No  Is the patient out of the medication? Yes  Has the patient been seen for an appointment in the last year OR does the patient have an upcoming appointment? Yes  Can we respond through MyChart? No  Agent: Please be advised that Rx refills may take up to 3 business days. We ask that you follow-up with your pharmacy.

## 2024-03-30 MED ORDER — TRAMADOL HCL 50 MG PO TABS: 50.0000 mg | ORAL_TABLET | Freq: Two times a day (BID) | ORAL | 0 refills | Status: DC | PRN

## 2024-03-30 NOTE — Telephone Encounter (Signed)
 Last filled 01-24-24 #30 Last OV 01-09-24 No Future OV [facility]

## 2024-04-03 DIAGNOSIS — I10 Essential (primary) hypertension: Secondary | ICD-10-CM | POA: Diagnosis not present

## 2024-04-03 DIAGNOSIS — I2510 Atherosclerotic heart disease of native coronary artery without angina pectoris: Secondary | ICD-10-CM | POA: Diagnosis not present

## 2024-04-03 DIAGNOSIS — E785 Hyperlipidemia, unspecified: Secondary | ICD-10-CM | POA: Diagnosis not present

## 2024-04-03 DIAGNOSIS — I5022 Chronic systolic (congestive) heart failure: Secondary | ICD-10-CM | POA: Diagnosis not present

## 2024-04-03 DIAGNOSIS — Z7982 Long term (current) use of aspirin: Secondary | ICD-10-CM | POA: Diagnosis not present

## 2024-04-03 DIAGNOSIS — I255 Ischemic cardiomyopathy: Secondary | ICD-10-CM | POA: Diagnosis not present

## 2024-04-03 DIAGNOSIS — I2583 Coronary atherosclerosis due to lipid rich plaque: Secondary | ICD-10-CM | POA: Diagnosis not present

## 2024-04-03 DIAGNOSIS — I480 Paroxysmal atrial fibrillation: Secondary | ICD-10-CM | POA: Diagnosis not present

## 2024-04-03 DIAGNOSIS — J439 Emphysema, unspecified: Secondary | ICD-10-CM | POA: Diagnosis not present

## 2024-04-03 DIAGNOSIS — Z955 Presence of coronary angioplasty implant and graft: Secondary | ICD-10-CM | POA: Diagnosis not present

## 2024-04-09 ENCOUNTER — Encounter: Payer: Self-pay | Admitting: General Practice

## 2024-04-09 ENCOUNTER — Telehealth: Payer: Self-pay

## 2024-04-09 ENCOUNTER — Ambulatory Visit (INDEPENDENT_AMBULATORY_CARE_PROVIDER_SITE_OTHER): Admitting: General Practice

## 2024-04-09 VITALS — BP 120/60 | HR 76 | Temp 98.0°F | Ht 66.0 in | Wt 148.0 lb

## 2024-04-09 DIAGNOSIS — K219 Gastro-esophageal reflux disease without esophagitis: Secondary | ICD-10-CM

## 2024-04-09 DIAGNOSIS — R110 Nausea: Secondary | ICD-10-CM

## 2024-04-09 MED ORDER — ONDANSETRON 4 MG PO TBDP: 4.0000 mg | ORAL_TABLET | Freq: Three times a day (TID) | ORAL | 0 refills | Status: AC | PRN

## 2024-04-09 NOTE — Progress Notes (Signed)
 Established Patient Office Visit  Subjective   Patient ID: Leon Estes, male    DOB: 1931/06/12  Age: 89 y.o. MRN: [id number]  Chief Complaint  Patient presents with   Nausea    X 2 days off and on; more in the mornings after he eats.     HPI  Discussed the use of AI scribe software for clinical note transcription with the patient, who gave verbal consent to proceed.  History of Present Illness Leon Estes is a 89 year old male who presents with nausea and epigastric pain.  He has been experiencing nausea and epigastric pain for the past three to four days. The nausea primarily occurs in the morning, usually after eating, and is accompanied by odynophagia. No vomiting has been noted.  He is currently taking Protonix  40 mg once daily, typically in the morning after eating, and sucralfate , which he takes at breakfast. However, he has not been consistently taking sucralfate  four times a day as prescribed, often managing only twice daily. He finds the sucralfate  pills large and difficult to swallow, so he breaks them in half. He has tried other medications such as Imbetrol and Pepto Nol without relief.  His diet includes decaffeinated coffee and occasionally barbecue chicken, which he acknowledges may trigger his symptoms. No fever or urinary symptoms have been reported.    Patient Active Problem List   Diagnosis Date Noted   Rash of both hands 11/13/2023   Tinnitus 10/18/2023   Vascular dementia without behavioral disturbance, psychotic disturbance, mood disturbance, or anxiety (HCC) 04/09/2023   Osteoarthritis of both hands 01/02/2023   Chronic cholecystitis 08/01/2022   Chronic combined systolic (congestive) and diastolic (congestive) heart failure (HCC) 08/01/2022   RUQ pain 07/02/2022   Gallstones 07/02/2022   NSVT (nonsustained ventricular tachycardia) (HCC)    Systolic heart failure (HCC) 10/12/2021   PAF (paroxysmal atrial fibrillation) (HCC) 10/12/2021   Reflux  esophagitis 09/28/2021   Neurodermatitis 09/15/2021   Allergic rhinitis due to pollen 06/12/2021   GERD (gastroesophageal reflux disease) 05/04/2021   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) 04/07/2021   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   HOH (hard of hearing) 12/28/2020   Pulmonary nodule 12/06/2020   Preventative health care 10/12/2020   Macular degeneration, wet (HCC) 10/12/2020   BPH (benign prostatic hyperplasia) 01/13/2020   Neuropathy 02/18/2018   COPD (chronic obstructive pulmonary disease) (HCC)    Benign essential HTN 04/09/2017   Atherosclerotic heart disease of native coronary artery with angina pectoris (HCC) 03/03/2015   Hyperlipidemia 03/03/2015   Past Medical History:  Diagnosis Date   Asthma    BPH (benign prostatic hypertrophy)    Cataract    Chronic renal disease, stage III (HCC)    Chronic sinusitis    COPD (chronic obstructive pulmonary disease) (HCC)    Coronary artery disease    2 stents   Gall stones    Hearing loss    DOES NOT WEAR HEARING AIDS   High cholesterol    Hypertension    Lactic acid acidosis 10/12/2021   Macular degeneration    right eye   Rash 12/04/2021   Septic shock (HCC) 10/10/2021   Past Surgical History:  Procedure Laterality Date   APPENDECTOMY     CORONARY ANGIOPLASTY WITH STENT PLACEMENT  1998   2 stents   TRANSURETHRAL RESECTION OF PROSTATE     Allergies  Allergen Reactions   Augmentin  [Amoxicillin -Pot Clavulanate] Rash   Bacitracin-Polymyxin B Swelling and  Rash   Benzalkonium Chloride     Benzalkonium chloride is a quaternary ammonium antiseptic and disinfectant with actions and uses similar to those of other cationic surfactants. It is also used as an antimicrobial preservative for pharmaceutical products.   Neosporin [Neomycin-Bacitracin Zn-Polymyx] Rash   Sulfamethoxazole -Trimethoprim  Rash   Terbinafine And Related Rash         04/09/2024   11:08 AM 01/09/2024     2:46 PM 08/22/2023   11:14 AM  Depression screen PHQ 2/9  Decreased Interest 0 0 0  Down, Depressed, Hopeless 0 0 0  PHQ - 2 Score 0 0 0  Altered sleeping 1  1  Tired, decreased energy 0  1  Change in appetite 0  0  Feeling bad or failure about yourself  0  0  Trouble concentrating 0  0  Moving slowly or fidgety/restless 0  0  Suicidal thoughts   0  PHQ-9 Score 1  2  Difficult doing work/chores Not difficult at all  Not difficult at all       04/09/2024   11:09 AM 08/22/2023   11:14 AM 07/23/2023   11:34 AM  GAD 7 : Generalized Anxiety Score  Nervous, Anxious, on Edge 0 1 0  Control/stop worrying 0 1 0  Worry too much - different things 1 1 0  Trouble relaxing 0 0 0  Restless 0 0 0  Easily annoyed or irritable 0 0 0  Afraid - awful might happen 1 1 0  Total GAD 7 Score 2 4 0  Anxiety Difficulty Somewhat difficult Somewhat difficult Not difficult at all      Review of Systems  Constitutional:  Negative for chills and fever.  Respiratory:  Negative for shortness of breath.   Cardiovascular:  Negative for chest pain.  Gastrointestinal:  Positive for nausea. Negative for abdominal pain, constipation, diarrhea, heartburn and vomiting.  Genitourinary:  Negative for dysuria, frequency and urgency.  Neurological:  Negative for dizziness and headaches.  Endo/Heme/Allergies:  Negative for polydipsia.  Psychiatric/Behavioral:  Negative for depression and suicidal ideas. The patient is not nervous/anxious.       Objective:     BP 120/60   Pulse 76   Temp 98 F (36.7 C) (Oral)   Ht 5' 6 (1.676 m)   Wt 148 lb (67.1 kg)   SpO2 96%   BMI 23.89 kg/m  BP Readings from Last 3 Encounters:  04/09/24 120/60  01/09/24 (!) 108/58  11/13/23 130/82   Wt Readings from Last 3 Encounters:  04/09/24 148 lb (67.1 kg)  01/09/24 148 lb (67.1 kg)  11/13/23 145 lb (65.8 kg)      Physical Exam Vitals and nursing note reviewed.  Constitutional:      Appearance: Normal appearance.   Cardiovascular:     Rate and Rhythm: Normal rate and regular rhythm.     Pulses: Normal pulses.     Heart sounds: Normal heart sounds.  Pulmonary:     Effort: Pulmonary effort is normal.     Breath sounds: Normal breath sounds.  Abdominal:     General: Bowel sounds are normal.  Neurological:     Mental Status: He is alert and oriented to person, place, and time.  Psychiatric:        Mood and Affect: Mood normal.        Behavior: Behavior normal.        Thought Content: Thought content normal.        Judgment: Judgment normal.  No results found for any visits on 04/09/24.     The ASCVD Risk score (Arnett DK, et al., 2019) failed to calculate for the following reasons:   The 2019 ASCVD risk score is only valid for ages 63 to 67    Assessment & Plan:  Nausea -     Ondansetron ; Take 1 tablet (4 mg total) by mouth every 8 (eight) hours as needed for nausea or vomiting.  Dispense: 20 tablet; Refill: 0    Assessment and Plan Assessment & Plan Gastroesophageal reflux disease with associated nausea and epigastric pain Symptoms indicate uncontrolled acid reflux. Discussed dietary triggers. Possible gastroenterologist referral if no improvement. - Prescribed Zofran  for nausea as needed. - Instructed to take Protonix  before meals, especially breakfast. - Advised sucralfate  four times daily with meals, cut pill if needed. - Schedule follow-up if no improvement.   Return if symptoms worsen or fail to improve.    Carrol Aurora, NP

## 2024-04-09 NOTE — Telephone Encounter (Signed)
 Copied from CRM #[id number]. Topic: Clinical - Medication Prior Auth >> Apr 09, 2024 12:09 PM Donna BRAVO wrote: Reason for CRM: [phone] Created by: [facility] is asking to speak to a nurse about prior auth for ondansetron  (ZOFRAN -ODT) 4 MG disintegrating tablet    ----------------------------------------------------------------------- From previous Reason for Contact - Medication Question: Reason for CRM:

## 2024-04-09 NOTE — Patient Instructions (Signed)
 Start Zofran  as needed for nausea.   Take protonix  before your breakfast.   Take Sucralfate  with meals as prescribed.   Schedule follow up if not better.   It was a pleasure to see you today!

## 2024-04-09 NOTE — Telephone Encounter (Signed)
 Patient was seen today in office and was told to make a TOC appt with a provider taking TOCs; not sure patient understood to make appt at checkout. Please call patient to get him set up with TOC appt.

## 2024-04-10 ENCOUNTER — Other Ambulatory Visit (HOSPITAL_COMMUNITY): Payer: Self-pay

## 2024-04-10 ENCOUNTER — Telehealth: Payer: Self-pay

## 2024-04-10 NOTE — Telephone Encounter (Signed)
 Pharmacy Patient Advocate Encounter   Received notification from Onbase that prior authorization for Ondansetron  4MG  dispersible tablets is required/requested.   Insurance verification completed.   The patient is insured through [facility] ADVANTAGE/RX ADVANCE .   Per test claim: PA required; PA submitted to above mentioned insurance via Latent Key/confirmation #/EOC [id number] Status is pending

## 2024-04-10 NOTE — Telephone Encounter (Signed)
 [facility]acy and issue has already been resolved and patient picked up rx this morning.

## 2024-04-14 ENCOUNTER — Other Ambulatory Visit (HOSPITAL_COMMUNITY): Payer: Self-pay

## 2024-04-15 DIAGNOSIS — I255 Ischemic cardiomyopathy: Secondary | ICD-10-CM | POA: Diagnosis not present

## 2024-04-20 NOTE — Telephone Encounter (Signed)
 Pharmacy Patient Advocate Encounter  Received notification from [facility] ADVANTAGE/RX ADVANCE that Prior Authorization forOndansetron 4MG  dispersible tablets  has been DENIED.  Full denial letter will be uploaded to the media tab. See denial reason below.    PA #/Case ID/Reference #: [id number]

## 2024-04-29 ENCOUNTER — Other Ambulatory Visit: Payer: Self-pay

## 2024-04-29 MED ORDER — GABAPENTIN 400 MG PO CAPS: 400.0000 mg | ORAL_CAPSULE | Freq: Every day | ORAL | 3 refills | Status: DC

## 2024-04-29 NOTE — Telephone Encounter (Signed)
 Rx sent electronically.

## 2024-05-04 ENCOUNTER — Other Ambulatory Visit: Payer: Self-pay

## 2024-05-04 MED ORDER — TRAMADOL HCL 50 MG PO TABS: 50.0000 mg | ORAL_TABLET | Freq: Two times a day (BID) | ORAL | 0 refills | Status: DC | PRN

## 2024-05-04 NOTE — Telephone Encounter (Signed)
 Last filled 03-30-24 #30 Last OV Acute 04-09-24 No Future OV [facility]

## 2024-06-04 ENCOUNTER — Other Ambulatory Visit: Payer: Self-pay | Admitting: Family

## 2024-06-18 ENCOUNTER — Other Ambulatory Visit: Payer: Self-pay

## 2024-06-18 MED ORDER — TRIAMCINOLONE ACETONIDE 0.1 % EX CREA: TOPICAL_CREAM | CUTANEOUS | 1 refills | Status: AC

## 2024-06-18 NOTE — Telephone Encounter (Signed)
 Pt needs TOC appt with a provider here if they have not set up at a different office.

## 2024-07-14 ENCOUNTER — Telehealth: Payer: Self-pay

## 2024-07-14 ENCOUNTER — Other Ambulatory Visit: Payer: Self-pay

## 2024-07-14 NOTE — Telephone Encounter (Signed)
 The request was sent to Padonda Webb's refills in October. It has not been done. Will forward to Dr Bennett to see if she will do the refill.

## 2024-07-14 NOTE — Telephone Encounter (Signed)
  Brena Manfred CROME, RN  Registered Nurse   Telephone Encounter Signed   Creation Time: 07/14/2024  1:30 PM   Signed     Copied from CRM #[id number]. Topic: Clinical - Prescription Issue >> Jul 14, 2024  1:18 PM Franky GRADE wrote: Reason for CRM: Patient's spouse Leonor is calling regarding a request for traMADol  (ULTRAM ) 50 MG tablet [[id number]] that [facility] pharmacy submitted on patient's behalf; however, they have not received a response as of yet. Patient is out of medication and has a Transfer of care scheduled with Dr.Bowa on 08/21/2024.

## 2024-07-14 NOTE — Telephone Encounter (Signed)
 Opened new not where rx can be done

## 2024-07-14 NOTE — Telephone Encounter (Signed)
 Copied from CRM #[id number]. Topic: Clinical - Prescription Issue >> Jul 14, 2024  1:18 PM Franky GRADE wrote: Reason for CRM: Patient's spouse Leonor is calling regarding a request for traMADol  (ULTRAM ) 50 MG tablet [[id number]] that [facility] pharmacy submitted on patient's behalf; however, they have not received a response as of yet. Patient is out of medication and has a Transfer of care scheduled with Dr.Bowa on 08/21/2024.

## 2024-07-16 MED ORDER — TRAMADOL HCL 50 MG PO TABS: 50.0000 mg | ORAL_TABLET | Freq: Two times a day (BID) | ORAL | 0 refills | Status: DC | PRN

## 2024-08-18 ENCOUNTER — Emergency Department (HOSPITAL_COMMUNITY)

## 2024-08-18 ENCOUNTER — Inpatient Hospital Stay (HOSPITAL_COMMUNITY)
Admission: EM | Admit: 2024-08-18 | Discharge: 2024-08-24 | DRG: 070 | Disposition: A | Discharge status: Home Health | Attending: Internal Medicine | Admitting: Internal Medicine

## 2024-08-18 DIAGNOSIS — F01511 Vascular dementia, unspecified severity, with agitation: Secondary | ICD-10-CM | POA: Diagnosis present

## 2024-08-18 DIAGNOSIS — I10 Essential (primary) hypertension: Secondary | ICD-10-CM | POA: Diagnosis present

## 2024-08-18 DIAGNOSIS — Z888 Allergy status to other drugs, medicaments and biological substances status: Secondary | ICD-10-CM

## 2024-08-18 DIAGNOSIS — Z9079 Acquired absence of other genital organ(s): Secondary | ICD-10-CM

## 2024-08-18 DIAGNOSIS — R930 Abnormal findings on diagnostic imaging of skull and head, not elsewhere classified: Secondary | ICD-10-CM | POA: Diagnosis present

## 2024-08-18 DIAGNOSIS — R4701 Aphasia: Secondary | ICD-10-CM | POA: Diagnosis present

## 2024-08-18 DIAGNOSIS — R1310 Dysphagia, unspecified: Secondary | ICD-10-CM | POA: Diagnosis present

## 2024-08-18 DIAGNOSIS — Z955 Presence of coronary angioplasty implant and graft: Secondary | ICD-10-CM

## 2024-08-18 DIAGNOSIS — Y92098 Other place in other non-institutional residence as the place of occurrence of the external cause: Secondary | ICD-10-CM

## 2024-08-18 DIAGNOSIS — E860 Dehydration: Secondary | ICD-10-CM | POA: Diagnosis present

## 2024-08-18 DIAGNOSIS — W1809XA Striking against other object with subsequent fall, initial encounter: Secondary | ICD-10-CM | POA: Diagnosis present

## 2024-08-18 DIAGNOSIS — G9340 Encephalopathy, unspecified: Principal | ICD-10-CM | POA: Diagnosis present

## 2024-08-18 DIAGNOSIS — Z833 Family history of diabetes mellitus: Secondary | ICD-10-CM

## 2024-08-18 DIAGNOSIS — I2510 Atherosclerotic heart disease of native coronary artery without angina pectoris: Secondary | ICD-10-CM | POA: Diagnosis present

## 2024-08-18 DIAGNOSIS — Z515 Encounter for palliative care: Secondary | ICD-10-CM

## 2024-08-18 DIAGNOSIS — Z9049 Acquired absence of other specified parts of digestive tract: Secondary | ICD-10-CM

## 2024-08-18 DIAGNOSIS — J338 Other polyp of sinus: Secondary | ICD-10-CM | POA: Diagnosis present

## 2024-08-18 DIAGNOSIS — J4489 Other specified chronic obstructive pulmonary disease: Secondary | ICD-10-CM | POA: Diagnosis present

## 2024-08-18 DIAGNOSIS — I480 Paroxysmal atrial fibrillation: Secondary | ICD-10-CM | POA: Diagnosis present

## 2024-08-18 DIAGNOSIS — J40 Bronchitis, not specified as acute or chronic: Secondary | ICD-10-CM | POA: Diagnosis present

## 2024-08-18 DIAGNOSIS — Z7982 Long term (current) use of aspirin: Secondary | ICD-10-CM

## 2024-08-18 DIAGNOSIS — A419 Sepsis, unspecified organism: Secondary | ICD-10-CM

## 2024-08-18 DIAGNOSIS — N400 Enlarged prostate without lower urinary tract symptoms: Secondary | ICD-10-CM | POA: Diagnosis present

## 2024-08-18 DIAGNOSIS — R54 Age-related physical debility: Secondary | ICD-10-CM | POA: Diagnosis present

## 2024-08-18 DIAGNOSIS — Z87891 Personal history of nicotine dependence: Secondary | ICD-10-CM

## 2024-08-18 DIAGNOSIS — Z781 Physical restraint status: Secondary | ICD-10-CM

## 2024-08-18 DIAGNOSIS — E876 Hypokalemia: Secondary | ICD-10-CM | POA: Diagnosis present

## 2024-08-18 DIAGNOSIS — Z880 Allergy status to penicillin: Secondary | ICD-10-CM

## 2024-08-18 DIAGNOSIS — Z882 Allergy status to sulfonamides status: Secondary | ICD-10-CM

## 2024-08-18 DIAGNOSIS — Z7951 Long term (current) use of inhaled steroids: Secondary | ICD-10-CM

## 2024-08-18 DIAGNOSIS — R6520 Severe sepsis without septic shock: Secondary | ICD-10-CM

## 2024-08-18 DIAGNOSIS — R6510 Systemic inflammatory response syndrome (SIRS) of non-infectious origin without acute organ dysfunction: Secondary | ICD-10-CM | POA: Diagnosis present

## 2024-08-18 DIAGNOSIS — E872 Acidosis, unspecified: Secondary | ICD-10-CM | POA: Diagnosis present

## 2024-08-18 DIAGNOSIS — Z79899 Other long term (current) drug therapy: Secondary | ICD-10-CM

## 2024-08-18 DIAGNOSIS — I5022 Chronic systolic (congestive) heart failure: Secondary | ICD-10-CM | POA: Diagnosis present

## 2024-08-18 DIAGNOSIS — Z66 Do not resuscitate: Secondary | ICD-10-CM | POA: Diagnosis present

## 2024-08-18 DIAGNOSIS — R6511 Systemic inflammatory response syndrome (SIRS) of non-infectious origin with acute organ dysfunction: Secondary | ICD-10-CM | POA: Diagnosis present

## 2024-08-18 DIAGNOSIS — G9341 Metabolic encephalopathy: Principal | ICD-10-CM | POA: Diagnosis present

## 2024-08-18 DIAGNOSIS — Z883 Allergy status to other anti-infective agents status: Secondary | ICD-10-CM

## 2024-08-18 DIAGNOSIS — Z8249 Family history of ischemic heart disease and other diseases of the circulatory system: Secondary | ICD-10-CM

## 2024-08-18 DIAGNOSIS — Z881 Allergy status to other antibiotic agents status: Secondary | ICD-10-CM

## 2024-08-18 DIAGNOSIS — I255 Ischemic cardiomyopathy: Secondary | ICD-10-CM | POA: Diagnosis present

## 2024-08-18 DIAGNOSIS — H9190 Unspecified hearing loss, unspecified ear: Secondary | ICD-10-CM | POA: Diagnosis present

## 2024-08-18 DIAGNOSIS — Z1152 Encounter for screening for COVID-19: Secondary | ICD-10-CM

## 2024-08-18 DIAGNOSIS — N179 Acute kidney failure, unspecified: Secondary | ICD-10-CM | POA: Diagnosis present

## 2024-08-18 DIAGNOSIS — F0150 Vascular dementia without behavioral disturbance: Secondary | ICD-10-CM | POA: Diagnosis present

## 2024-08-18 DIAGNOSIS — N1831 Chronic kidney disease, stage 3a: Secondary | ICD-10-CM | POA: Diagnosis present

## 2024-08-18 DIAGNOSIS — H547 Unspecified visual loss: Secondary | ICD-10-CM | POA: Diagnosis present

## 2024-08-18 DIAGNOSIS — E7800 Pure hypercholesterolemia, unspecified: Secondary | ICD-10-CM | POA: Diagnosis present

## 2024-08-18 DIAGNOSIS — I130 Hypertensive heart and chronic kidney disease with heart failure and stage 1 through stage 4 chronic kidney disease, or unspecified chronic kidney disease: Secondary | ICD-10-CM | POA: Diagnosis present

## 2024-08-18 DIAGNOSIS — J449 Chronic obstructive pulmonary disease, unspecified: Secondary | ICD-10-CM | POA: Diagnosis present

## 2024-08-18 DIAGNOSIS — E870 Hyperosmolality and hypernatremia: Secondary | ICD-10-CM | POA: Diagnosis not present

## 2024-08-18 LAB — DIFFERENTIAL
Abs Immature Granulocytes: 0.1 K/uL — ABNORMAL HIGH (ref 0.00–0.07)
Basophils Absolute: 0.1 K/uL (ref 0.0–0.1)
Basophils Relative: 1 %
Eosinophils Absolute: 0 K/uL (ref 0.0–0.5)
Eosinophils Relative: 0 %
Immature Granulocytes: 1 %
Lymphocytes Relative: 18 %
Lymphs Abs: 2.8 K/uL (ref 0.7–4.0)
Monocytes Absolute: 2 K/uL — ABNORMAL HIGH (ref 0.1–1.0)
Monocytes Relative: 13 %
Neutro Abs: 10.8 K/uL — ABNORMAL HIGH (ref 1.7–7.7)
Neutrophils Relative %: 67 %

## 2024-08-18 LAB — CBC
HCT: 48 % (ref 39.0–52.0)
Hemoglobin: 15.2 g/dL (ref 13.0–17.0)
MCH: 29.3 pg (ref 26.0–34.0)
MCHC: 31.7 g/dL (ref 30.0–36.0)
MCV: 92.7 fL (ref 80.0–100.0)
Platelets: 278 K/uL (ref 150–400)
RBC: 5.18 MIL/uL (ref 4.22–5.81)
RDW: 15 % (ref 11.5–15.5)
WBC: 15.8 K/uL — ABNORMAL HIGH (ref 4.0–10.5)
nRBC: 0 % (ref 0.0–0.2)

## 2024-08-18 LAB — COMPREHENSIVE METABOLIC PANEL WITH GFR
ALT: 19 U/L (ref 0–44)
AST: 38 U/L (ref 15–41)
Albumin: 4.5 g/dL (ref 3.5–5.0)
Alkaline Phosphatase: 110 U/L (ref 38–126)
Anion gap: 15 (ref 5–15)
BUN: 11 mg/dL (ref 8–23)
CO2: 29 mmol/L (ref 22–32)
Calcium: 10.1 mg/dL (ref 8.9–10.3)
Chloride: 95 mmol/L — ABNORMAL LOW (ref 98–111)
Creatinine, Ser: 1.41 mg/dL — ABNORMAL HIGH (ref 0.61–1.24)
GFR, Estimated: 47 mL/min — ABNORMAL LOW
Glucose, Bld: 113 mg/dL — ABNORMAL HIGH (ref 70–99)
Potassium: 3.3 mmol/L — ABNORMAL LOW (ref 3.5–5.1)
Sodium: 138 mmol/L (ref 135–145)
Total Bilirubin: 0.8 mg/dL (ref 0.0–1.2)
Total Protein: 8.6 g/dL — ABNORMAL HIGH (ref 6.5–8.1)

## 2024-08-18 LAB — I-STAT CHEM 8, ED
BUN: 12 mg/dL (ref 8–23)
Calcium, Ion: 1.15 mmol/L (ref 1.15–1.40)
Chloride: 96 mmol/L — ABNORMAL LOW (ref 98–111)
Creatinine, Ser: 1.5 mg/dL — ABNORMAL HIGH (ref 0.61–1.24)
Glucose, Bld: 119 mg/dL — ABNORMAL HIGH (ref 70–99)
HCT: 51 % (ref 39.0–52.0)
Hemoglobin: 17.3 g/dL — ABNORMAL HIGH (ref 13.0–17.0)
Potassium: 3.2 mmol/L — ABNORMAL LOW (ref 3.5–5.1)
Sodium: 138 mmol/L (ref 135–145)
TCO2: 27 mmol/L (ref 22–32)

## 2024-08-18 LAB — ETHANOL: Alcohol, Ethyl (B): <15 mg/dL

## 2024-08-18 LAB — I-STAT CG4 LACTIC ACID, ED: Lactic Acid, Venous: 2.4 mmol/L (ref 0.5–1.9)

## 2024-08-18 LAB — PROTIME-INR
INR: 1 (ref 0.8–1.2)
Prothrombin Time: 13.9 s (ref 11.4–15.2)

## 2024-08-18 LAB — CBG MONITORING, ED: Glucose-Capillary: 140 mg/dL — ABNORMAL HIGH (ref 70–99)

## 2024-08-18 LAB — APTT: aPTT: 41 s — ABNORMAL HIGH (ref 24–36)

## 2024-08-18 MED ORDER — SODIUM CHLORIDE 0.9 % IV BOLUS
500.0000 mL | Freq: Once | INTRAVENOUS | Status: AC
  Administered 2024-08-18: 500 mL via INTRAVENOUS

## 2024-08-18 MED ORDER — LACTATED RINGERS IV BOLUS (SEPSIS)
1000.0000 mL | Freq: Once | INTRAVENOUS | Status: AC
  Administered 2024-08-19: 1000 mL via INTRAVENOUS

## 2024-08-18 MED ORDER — LORAZEPAM 2 MG/ML IJ SOLN
1.0000 mg | Freq: Once | INTRAMUSCULAR | Status: AC
  Administered 2024-08-18: 1 mg via INTRAVENOUS
  Filled 2024-08-18: qty 1

## 2024-08-18 MED ORDER — VANCOMYCIN HCL IN DEXTROSE 1-5 GM/200ML-% IV SOLN
1000.0000 mg | Freq: Once | INTRAVENOUS | Status: DC
  Filled 2024-08-18: qty 200

## 2024-08-18 MED ORDER — METRONIDAZOLE 500 MG/100ML IV SOLN
500.0000 mg | Freq: Once | INTRAVENOUS | Status: AC
  Administered 2024-08-19: 500 mg via INTRAVENOUS
  Filled 2024-08-18: qty 100

## 2024-08-18 MED ORDER — LACTATED RINGERS IV SOLN: INTRAVENOUS | Status: DC

## 2024-08-18 MED ORDER — VANCOMYCIN HCL 1500 MG/300ML IV SOLN
1500.0000 mg | Freq: Once | INTRAVENOUS | Status: AC
  Administered 2024-08-19: 1500 mg via INTRAVENOUS
  Filled 2024-08-18: qty 300

## 2024-08-18 MED ORDER — SODIUM CHLORIDE 0.9 % IV SOLN
2.0000 g | Freq: Once | INTRAVENOUS | Status: AC
  Administered 2024-08-19: 2 g via INTRAVENOUS
  Filled 2024-08-18: qty 12.5

## 2024-08-18 MED ORDER — SODIUM CHLORIDE 0.9 % IV SOLN
2.0000 g | Freq: Once | INTRAVENOUS | Status: DC
  Filled 2024-08-18: qty 10

## 2024-08-18 NOTE — ED Notes (Signed)
 X-ray attempted again, pt still uncooperative.

## 2024-08-18 NOTE — Progress Notes (Deleted)
 "    Leon Parlow T. Madilynne Mullan, MD, CAQ Sports Medicine [facility] at [facility] [address] [place], [address]  Phone: [phone]  FAX: [phone]  [name] - 89 y.o. male  MRN [id number]  Date of Birth: Apr 15, 1931  Date: 08/19/2024  PCP: Jimmy Charlie FERNS, MD  Referral: Jimmy Charlie FERNS, MD  No chief complaint on file.  Subjective:   Leon Estes is a 89 y.o. very pleasant male patient with There is no height or weight on file to calculate BMI. who presents with the following:  Discussed the use of AI scribe software for clinical note transcription with the patient, who gave verbal consent to proceed.  I have known Leon Estes well for years, and he presents today with some rash on his backside.   History of Present Illness     Review of Systems is noted in the HPI, as appropriate  Objective:   There were no vitals taken for this visit.  GEN: No acute distress; alert,appropriate. PULM: Breathing comfortably in no respiratory distress PSYCH: Normally interactive.   Laboratory and Imaging Data:  Assessment and Plan:   No diagnosis found. Assessment & Plan   Medication Management during today's office visit: No orders of the defined types were placed in this encounter.  There are no discontinued medications.  Orders placed today for conditions managed today: No orders of the defined types were placed in this encounter.   Disposition: No follow-ups on file.  Dragon [employer] One speech-to-text software was used for transcription in this dictation.  Possible transcriptional errors can occur using Animal nutritionist.   Signed,  Jacques DASEN. Kenric Ginger, MD   Outpatient Encounter Medications as of 08/19/2024  Medication Sig   acetaminophen  (TYLENOL ) 500 MG tablet Take 1,000 mg by mouth every 6 (six) hours as needed for moderate pain (pain score 4-6) or headache.   acetaminophen  (TYLENOL ) 650 MG CR tablet Take 1,300 mg by mouth every 8  (eight) hours as needed for pain.   aspirin  81 MG EC tablet Take 1 tablet (81 mg total) by mouth daily. Swallow whole.   budesonide  (PULMICORT ) 0.5 MG/2ML nebulizer solution TAKE TWO MLS (0.5 MG TOTAL) BY NEBULIZATION IN THE MORNING AND AT BEDTIME   carvedilol  (COREG ) 3.125 MG tablet TAKE ONE TABLET BY MOUTH TWICE A DAY WITH A MEAL   furosemide  (LASIX ) 20 MG tablet Take 1 tablet (20 mg total) by mouth daily.   gabapentin  (NEURONTIN ) 400 MG capsule Take 1-2 capsules (400-800 mg total) by mouth at bedtime.   ipratropium-albuterol  (DUONEB) 0.5-2.5 (3) MG/3ML SOLN TAKE THREE MLS (1 VIAL) BY NEBULIZATION THREE TO FOUR TIMES DAILY AS NEEDED   losartan  (COZAAR ) 25 MG tablet Take 0.5 tablets (12.5 mg total) by mouth daily.   Multiple Vitamin (MULTIVITAMIN WITH MINERALS) TABS tablet Take 1 tablet by mouth daily.   nitroGLYCERIN  (NITROSTAT ) 0.4 MG SL tablet Place 1 tablet (0.4 mg total) under the tongue every 5 (five) minutes as needed for chest pain.   ondansetron  (ZOFRAN -ODT) 4 MG disintegrating tablet Take 1 tablet (4 mg total) by mouth every 8 (eight) hours as needed for nausea or vomiting.   pantoprazole  (PROTONIX ) 40 MG tablet TAKE ONE TABLET (40 MG TOTAL) BY MOUTH DAILY.   potassium chloride  (KLOR-CON ) 10 MEQ tablet TAKE ONE TABLET BY MOUTH ONCE A DAY   QUEtiapine  (SEROQUEL ) 25 MG tablet TAKE ONE TABLET BY MOUTH EVERY NIGHT AT BEDTIME   simvastatin  (ZOCOR ) 40 MG tablet TAKE ONE TABLET BY MOUTH  EVERY NIGHT AT BEDTIME   sucralfate  (CARAFATE ) 1 g tablet Take 1 tablet (1 g total) by mouth 4 (four) times daily.   tamsulosin  (FLOMAX ) 0.4 MG CAPS capsule TAKE ONE CAPSULE BY MOUTH ONCE DAILY   traMADol  (ULTRAM ) 50 MG tablet Take 1 tablet (50 mg total) by mouth 2 (two) times daily as needed.   triamcinolone  cream (KENALOG ) 0.1 % APPLY ONE APPLICATION TOPICALLY TWO TIMES DAILY AS NEEDED   No facility-administered encounter medications on file as of 08/19/2024.   "

## 2024-08-18 NOTE — ED Notes (Signed)
 CCMD CALLED

## 2024-08-18 NOTE — ED Provider Notes (Signed)
 Blood pressure 107/64, pulse 76, temperature 98.8 F (37.1 C), temperature source Axillary, resp. rate 19, height 5' 8 (1.727 m), weight 63.5 kg, SpO2 97%.  Assuming care from Dr. Garrick.  In short, Leon Estes is a 89 y.o. male with a chief complaint of Altered Mental Status and Fall .  Refer to the original H&P for additional details.  The current plan of care is to follow up on CXR, abx, and admit with SIRS and AMS.  Chest x-ray shows bronchitis versus atypical infection.  Viral panel pending but antibiotics started previously with vitals and lab markers consistent with sepsis.  No hypotension. Plan for admit.   Discussed patient's case with TRH to request admission. Patient and family (if present) updated with plan.   I reviewed all nursing notes, vitals, pertinent old records, EKGs, labs, imaging (as available).    Darra Fonda MATSU, MD 08/19/24 [phone]

## 2024-08-18 NOTE — ED Provider Notes (Signed)
 " [place] [facility] AT [facility] Provider Note   CSN: [id number] Arrival date & time: 08/18/24  1952     Patient presents with: Altered Mental Status and Fall   Leon Estes is a 89 y.o. male.   HPI Pt BIB [facility] from home. LSN 1500. Around 1700 today pt had a witnessed fall today in the doorway, did not hit his head and no obvious trauma. Pt has been having worsening aphasia and AMS since.    EMS 128/58 BP 99% RA 22 R 80sP 130 cbg 20LAC   The patient himself cannot provide any additional details, he is moving all extremity spontaneously, not following commands reliably, has a patent airway.    Prior to Admission medications  Medication Sig Start Date End Date Taking? Authorizing Provider  acetaminophen  (TYLENOL ) 500 MG tablet Take 1,000 mg by mouth every 6 (six) hours as needed for moderate pain (pain score 4-6) or headache.    [provider]  acetaminophen  (TYLENOL ) 650 MG CR tablet Take 1,300 mg by mouth every 8 (eight) hours as needed for pain.    [provider]  aspirin  81 MG EC tablet Take 1 tablet (81 mg total) by mouth daily. Swallow whole. 10/18/21   Vernon Ranks, MD  budesonide  (PULMICORT ) 0.5 MG/2ML nebulizer solution TAKE TWO MLS (0.5 MG TOTAL) BY NEBULIZATION IN THE MORNING AND AT BEDTIME 09/23/23   Jimmy Ade I, MD  carvedilol  (COREG ) 3.125 MG tablet TAKE ONE TABLET BY MOUTH TWICE A DAY WITH A MEAL 02/27/24   Nahser, Aleene PARAS, MD  furosemide  (LASIX ) 20 MG tablet Take 1 tablet (20 mg total) by mouth daily. 11/07/21   Parthenia Olivia CHRISTELLA, PA-C  gabapentin  (NEURONTIN ) 400 MG capsule Take 1-2 capsules (400-800 mg total) by mouth at bedtime. 04/29/24   Webb, Padonda B, FNP  ipratropium-albuterol  (DUONEB) 0.5-2.5 (3) MG/3ML SOLN TAKE THREE MLS (1 VIAL) BY NEBULIZATION THREE TO FOUR TIMES DAILY AS NEEDED 02/26/24   Jimmy Ade FERNS, MD  losartan  (COZAAR ) 25 MG tablet Take 0.5 tablets (12.5 mg total) by mouth daily. 10/25/23    Nahser, Aleene PARAS, MD  Multiple Vitamin (MULTIVITAMIN WITH MINERALS) TABS tablet Take 1 tablet by mouth daily. 01/23/21   Will Almarie MATSU, MD  nitroGLYCERIN  (NITROSTAT ) 0.4 MG SL tablet Place 1 tablet (0.4 mg total) under the tongue every 5 (five) minutes as needed for chest pain. 10/10/23   Letvak, Richard I, MD  ondansetron  (ZOFRAN -ODT) 4 MG disintegrating tablet Take 1 tablet (4 mg total) by mouth every 8 (eight) hours as needed for nausea or vomiting. 04/09/24   Vincente Shivers, NP  pantoprazole  (PROTONIX ) 40 MG tablet TAKE ONE TABLET (40 MG TOTAL) BY MOUTH DAILY. 08/29/23   Jimmy Ade FERNS, MD  potassium chloride  (KLOR-CON ) 10 MEQ tablet TAKE ONE TABLET BY MOUTH ONCE A DAY 09/30/23   Nahser, Aleene PARAS, MD  QUEtiapine  (SEROQUEL ) 25 MG tablet TAKE ONE TABLET BY MOUTH EVERY NIGHT AT BEDTIME 09/30/23   Jimmy Ade FERNS, MD  simvastatin  (ZOCOR ) 40 MG tablet TAKE ONE TABLET BY MOUTH EVERY NIGHT AT BEDTIME 12/23/23   Jimmy Ade FERNS, MD  sucralfate  (CARAFATE ) 1 g tablet Take 1 tablet (1 g total) by mouth 4 (four) times daily. 11/19/23 11/18/24  Jimmy Ade FERNS, MD  tamsulosin  (FLOMAX ) 0.4 MG CAPS capsule TAKE ONE CAPSULE BY MOUTH ONCE DAILY 08/29/23   Jimmy Ade FERNS, MD  traMADol  (ULTRAM ) 50 MG tablet Take 1 tablet (50 mg total) by mouth 2 (  two) times daily as needed. 07/16/24   Bowa, Nahosi K, MD  triamcinolone  cream (KENALOG ) 0.1 % APPLY ONE APPLICATION TOPICALLY TWO TIMES DAILY AS NEEDED 06/18/24   Webb, Padonda B, FNP    Allergies: Augmentin  [amoxicillin -pot clavulanate], Bacitracin-polymyxin b, Benzalkonium chloride, Neosporin [neomycin-bacitracin zn-polymyx], Sulfamethoxazole -trimethoprim , and Terbinafine and related    Review of Systems  Updated Vital Signs BP 107/64   Pulse 76   Temp 98.8 F (37.1 C) (Axillary)   Resp 19   Ht 1.727 m (5' 8)   Wt 63.5 kg   SpO2 97%   BMI 21.29 kg/m   Physical Exam Vitals and nursing note reviewed.  Constitutional:      General: He is not in acute  distress.    Appearance: He is well-developed.  HENT:     Head: Normocephalic and atraumatic.  Eyes:     Conjunctiva/sclera: Conjunctivae normal.  Cardiovascular:     Rate and Rhythm: Normal rate and regular rhythm.  Pulmonary:     Effort: Pulmonary effort is normal. No respiratory distress.     Breath sounds: No stridor.  Abdominal:     General: There is no distension.  Skin:    General: Skin is warm and dry.  Neurological:     Mental Status: He is alert.     Comments: Face is symmetric, speech is minimal Patient moves all extremity spontaneously, does not follow commands reliably.     (all labs ordered are listed, but only abnormal results are displayed) Labs Reviewed  CBG MONITORING, ED - Abnormal; Notable for the following components:      Result Value   Glucose-Capillary 140 (*)    All other components within normal limits  I-STAT CHEM 8, ED - Abnormal; Notable for the following components:   Potassium 3.2 (*)    Chloride 96 (*)    Creatinine, Ser 1.50 (*)    Glucose, Bld 119 (*)    Hemoglobin 17.3 (*)    All other components within normal limits  I-STAT CG4 LACTIC ACID, ED - Abnormal; Notable for the following components:   Lactic Acid, Venous 2.4 (*)    All other components within normal limits  PROTIME-INR  APTT  CBC  DIFFERENTIAL  COMPREHENSIVE METABOLIC PANEL WITH GFR  ETHANOL  URINALYSIS, ROUTINE W REFLEX MICROSCOPIC  I-STAT CHEM 8, ED  CBG MONITORING, ED    EKG: None  Radiology: No results found.   Procedures   Medications Ordered in the ED - No data to display                                  Medical Decision Making Elderly male presents after fall with altered mental status.  Patient arrived via EMS, no family present.  Unclear time of onset of the aphasia, altered mental status, pre or post fall.  With broad differential including trauma, stroke, metabolic etiology, infection, patient had labs CT x-ray monitoring.   Amount and/or  Complexity of Data Reviewed Independent Historian: EMS Labs: ordered. Decision-making details documented in ED Course. Radiology: ordered and independent interpretation performed. Decision-making details documented in ED Course. ECG/medicine tests: ordered and independent interpretation performed. Decision-making details documented in ED Course.  Risk Prescription drug management. Decision regarding hospitalization. Diagnosis or treatment significantly limited by social determinants of health.   On repeat exam patient much more calm, resting in a supine position.  He remains borderline hypotensive, though MAP is in the 80s.  Patient with mild fever, and with initial leukocytosis, lactic acidosis, and confusion patient received broad-spectrum antibiotics for concern of sepsis. X-ray, urinalysis pending. Initial head CT without mass, hemorrhage, but with abnormal opacification in the left sinuses.  I discussed this with her ENT colleague, Dr. Jesus.  This is a finding appropriate for outpatient follow-up when the patient is appropriate.   Urinalysis, x-ray, antibiotics, patient likely to be admitted, Dr. Darra aware.  CRITICAL CARE Performed by: Lamar Salen Total critical care time: 35 minutes Critical care time was exclusive of separately billable procedures and treating other patients. Critical care was necessary to treat or prevent imminent or life-threatening deterioration. Critical care was time spent personally by me on the following activities: development of treatment plan with patient and/or surrogate as well as nursing, discussions with consultants, evaluation of patient's response to treatment, examination of patient, obtaining history from patient or surrogate, ordering and performing treatments and interventions, ordering and review of laboratory studies, ordering and review of radiographic studies, pulse oximetry and re-evaluation of patient's condition.   Final diagnoses:   Acute encephalopathy  Severe sepsis [facility])    ED Discharge Orders     None          Salen Lamar, MD 08/18/24 2331  "

## 2024-08-18 NOTE — ED Notes (Signed)
 Pt uncooperative with chest x-ray

## 2024-08-18 NOTE — ED Notes (Signed)
 Unable to obtain cultures at this time d/t to pt being uncooperative. Pt is agitated and pushing and swatting at staff.

## 2024-08-18 NOTE — ED Triage Notes (Addendum)
 Pt BIB [facility] from home. LSN 1500. Around 1700 today pt had a witnessed fall today in the doorway, did not hit his head and no obvious trauma. Pt has been having worsening aphasia and AMS since. Baseline is unknown.  EMS 128/58 BP 99% RA 22 R 80sP 130 cbg 20LAC

## 2024-08-18 NOTE — Sepsis Progress Note (Signed)
 Elink monitoring for the code sepsis protocol.   2338: Notified provider of need to order 2nd lactic acid.

## 2024-08-19 ENCOUNTER — Inpatient Hospital Stay (HOSPITAL_COMMUNITY)

## 2024-08-19 ENCOUNTER — Emergency Department (HOSPITAL_COMMUNITY)

## 2024-08-19 ENCOUNTER — Encounter (HOSPITAL_COMMUNITY): Payer: Self-pay | Admitting: Family Medicine

## 2024-08-19 ENCOUNTER — Ambulatory Visit: Payer: Self-pay | Admitting: Family Medicine

## 2024-08-19 DIAGNOSIS — G9341 Metabolic encephalopathy: Secondary | ICD-10-CM | POA: Diagnosis present

## 2024-08-19 DIAGNOSIS — E860 Dehydration: Secondary | ICD-10-CM | POA: Diagnosis present

## 2024-08-19 DIAGNOSIS — W1809XA Striking against other object with subsequent fall, initial encounter: Secondary | ICD-10-CM | POA: Diagnosis present

## 2024-08-19 DIAGNOSIS — R930 Abnormal findings on diagnostic imaging of skull and head, not elsewhere classified: Secondary | ICD-10-CM

## 2024-08-19 DIAGNOSIS — Z1152 Encounter for screening for COVID-19: Secondary | ICD-10-CM | POA: Diagnosis not present

## 2024-08-19 DIAGNOSIS — R6511 Systemic inflammatory response syndrome (SIRS) of non-infectious origin with acute organ dysfunction: Secondary | ICD-10-CM | POA: Diagnosis present

## 2024-08-19 DIAGNOSIS — G9340 Encephalopathy, unspecified: Principal | ICD-10-CM

## 2024-08-19 DIAGNOSIS — N1831 Chronic kidney disease, stage 3a: Secondary | ICD-10-CM | POA: Diagnosis present

## 2024-08-19 DIAGNOSIS — J449 Chronic obstructive pulmonary disease, unspecified: Secondary | ICD-10-CM

## 2024-08-19 DIAGNOSIS — E872 Acidosis, unspecified: Secondary | ICD-10-CM | POA: Diagnosis present

## 2024-08-19 DIAGNOSIS — R4701 Aphasia: Secondary | ICD-10-CM | POA: Diagnosis present

## 2024-08-19 DIAGNOSIS — R21 Rash and other nonspecific skin eruption: Secondary | ICD-10-CM

## 2024-08-19 DIAGNOSIS — I255 Ischemic cardiomyopathy: Secondary | ICD-10-CM | POA: Diagnosis present

## 2024-08-19 DIAGNOSIS — I130 Hypertensive heart and chronic kidney disease with heart failure and stage 1 through stage 4 chronic kidney disease, or unspecified chronic kidney disease: Secondary | ICD-10-CM | POA: Diagnosis present

## 2024-08-19 DIAGNOSIS — R569 Unspecified convulsions: Secondary | ICD-10-CM | POA: Diagnosis not present

## 2024-08-19 DIAGNOSIS — F0150 Vascular dementia without behavioral disturbance: Secondary | ICD-10-CM | POA: Diagnosis not present

## 2024-08-19 DIAGNOSIS — Z7189 Other specified counseling: Secondary | ICD-10-CM | POA: Diagnosis not present

## 2024-08-19 DIAGNOSIS — R6510 Systemic inflammatory response syndrome (SIRS) of non-infectious origin without acute organ dysfunction: Secondary | ICD-10-CM | POA: Diagnosis not present

## 2024-08-19 DIAGNOSIS — I5022 Chronic systolic (congestive) heart failure: Secondary | ICD-10-CM

## 2024-08-19 DIAGNOSIS — I10 Essential (primary) hypertension: Secondary | ICD-10-CM | POA: Diagnosis not present

## 2024-08-19 DIAGNOSIS — Z711 Person with feared health complaint in whom no diagnosis is made: Secondary | ICD-10-CM | POA: Diagnosis not present

## 2024-08-19 DIAGNOSIS — Z515 Encounter for palliative care: Secondary | ICD-10-CM | POA: Diagnosis not present

## 2024-08-19 DIAGNOSIS — J4489 Other specified chronic obstructive pulmonary disease: Secondary | ICD-10-CM | POA: Diagnosis present

## 2024-08-19 DIAGNOSIS — Z66 Do not resuscitate: Secondary | ICD-10-CM | POA: Diagnosis present

## 2024-08-19 DIAGNOSIS — E7800 Pure hypercholesterolemia, unspecified: Secondary | ICD-10-CM | POA: Diagnosis present

## 2024-08-19 DIAGNOSIS — I2510 Atherosclerotic heart disease of native coronary artery without angina pectoris: Secondary | ICD-10-CM | POA: Diagnosis present

## 2024-08-19 DIAGNOSIS — R1310 Dysphagia, unspecified: Secondary | ICD-10-CM | POA: Diagnosis present

## 2024-08-19 DIAGNOSIS — R4182 Altered mental status, unspecified: Secondary | ICD-10-CM | POA: Diagnosis not present

## 2024-08-19 DIAGNOSIS — N179 Acute kidney failure, unspecified: Secondary | ICD-10-CM | POA: Diagnosis present

## 2024-08-19 DIAGNOSIS — E870 Hyperosmolality and hypernatremia: Secondary | ICD-10-CM | POA: Diagnosis not present

## 2024-08-19 DIAGNOSIS — N400 Enlarged prostate without lower urinary tract symptoms: Secondary | ICD-10-CM | POA: Diagnosis present

## 2024-08-19 DIAGNOSIS — E876 Hypokalemia: Secondary | ICD-10-CM | POA: Diagnosis present

## 2024-08-19 DIAGNOSIS — F01511 Vascular dementia, unspecified severity, with agitation: Secondary | ICD-10-CM | POA: Diagnosis present

## 2024-08-19 DIAGNOSIS — Y92098 Other place in other non-institutional residence as the place of occurrence of the external cause: Secondary | ICD-10-CM | POA: Diagnosis not present

## 2024-08-19 DIAGNOSIS — J40 Bronchitis, not specified as acute or chronic: Secondary | ICD-10-CM | POA: Diagnosis present

## 2024-08-19 DIAGNOSIS — I480 Paroxysmal atrial fibrillation: Secondary | ICD-10-CM | POA: Diagnosis present

## 2024-08-19 LAB — CBC
HCT: 35.5 % — ABNORMAL LOW (ref 39.0–52.0)
Hemoglobin: 11.2 g/dL — ABNORMAL LOW (ref 13.0–17.0)
MCH: 29.4 pg (ref 26.0–34.0)
MCHC: 31.5 g/dL (ref 30.0–36.0)
MCV: 93.2 fL (ref 80.0–100.0)
Platelets: 216 K/uL (ref 150–400)
RBC: 3.81 MIL/uL — ABNORMAL LOW (ref 4.22–5.81)
RDW: 15 % (ref 11.5–15.5)
WBC: 12.6 K/uL — ABNORMAL HIGH (ref 4.0–10.5)
nRBC: 0 % (ref 0.0–0.2)

## 2024-08-19 LAB — PROCALCITONIN: Procalcitonin: 0.17 ng/mL

## 2024-08-19 LAB — BASIC METABOLIC PANEL WITH GFR
Anion gap: 10 (ref 5–15)
BUN: 14 mg/dL (ref 8–23)
CO2: 27 mmol/L (ref 22–32)
Calcium: 8.5 mg/dL — ABNORMAL LOW (ref 8.9–10.3)
Chloride: 101 mmol/L (ref 98–111)
Creatinine, Ser: 1.16 mg/dL (ref 0.61–1.24)
GFR, Estimated: 59 mL/min — ABNORMAL LOW
Glucose, Bld: 91 mg/dL (ref 70–99)
Potassium: 3.3 mmol/L — ABNORMAL LOW (ref 3.5–5.1)
Sodium: 137 mmol/L (ref 135–145)

## 2024-08-19 LAB — MAGNESIUM: Magnesium: 1.8 mg/dL (ref 1.7–2.4)

## 2024-08-19 LAB — RESP PANEL BY RT-PCR (RSV, FLU A&B, COVID)  RVPGX2
Influenza A by PCR: NEGATIVE
Influenza B by PCR: NEGATIVE
Resp Syncytial Virus by PCR: NEGATIVE
SARS Coronavirus 2 by RT PCR: NEGATIVE

## 2024-08-19 LAB — URINALYSIS, ROUTINE W REFLEX MICROSCOPIC
Bacteria, UA: NONE SEEN
Glucose, UA: NEGATIVE mg/dL
Ketones, ur: 5 mg/dL — AB
Leukocytes,Ua: NEGATIVE
Nitrite: NEGATIVE
Protein, ur: >=300 mg/dL — AB
Specific Gravity, Urine: 1.032 — ABNORMAL HIGH (ref 1.005–1.030)
pH: 5 (ref 5.0–8.0)

## 2024-08-19 LAB — AMMONIA: Ammonia: 26 umol/L (ref 9–35)

## 2024-08-19 LAB — I-STAT CG4 LACTIC ACID, ED: Lactic Acid, Venous: 1.9 mmol/L (ref 0.5–1.9)

## 2024-08-19 LAB — SYPHILIS: RPR W/REFLEX TO RPR TITER AND TREPONEMAL ANTIBODIES, TRADITIONAL SCREENING AND DIAGNOSIS ALGORITHM: RPR Ser Ql: NONREACTIVE

## 2024-08-19 LAB — VITAMIN B12: Vitamin B-12: 650 pg/mL (ref 180–914)

## 2024-08-19 LAB — TSH: TSH: 0.573 u[IU]/mL (ref 0.350–4.500)

## 2024-08-19 MED ORDER — ENOXAPARIN SODIUM 40 MG/0.4ML IJ SOSY
40.0000 mg | PREFILLED_SYRINGE | INTRAMUSCULAR | Status: DC
  Administered 2024-08-19 – 2024-08-24 (×6): 40 mg via SUBCUTANEOUS
  Filled 2024-08-19 (×6): qty 0.4

## 2024-08-19 MED ORDER — LORAZEPAM 2 MG/ML IJ SOLN
1.0000 mg | Freq: Once | INTRAMUSCULAR | Status: AC | PRN
  Administered 2024-08-19: 1 mg via INTRAVENOUS
  Filled 2024-08-19: qty 1

## 2024-08-19 MED ORDER — SODIUM CHLORIDE 0.9% FLUSH
10.0000 mL | Freq: Two times a day (BID) | INTRAVENOUS | Status: DC
  Administered 2024-08-19: 20 mL
  Administered 2024-08-20 – 2024-08-24 (×9): 10 mL

## 2024-08-19 MED ORDER — LORAZEPAM 2 MG/ML IJ SOLN
0.5000 mg | Freq: Four times a day (QID) | INTRAMUSCULAR | Status: DC | PRN
  Administered 2024-08-19 – 2024-08-20 (×4): 0.5 mg via INTRAVENOUS
  Filled 2024-08-19 (×4): qty 1

## 2024-08-19 MED ORDER — SENNOSIDES-DOCUSATE SODIUM 8.6-50 MG PO TABS: 1.0000 | ORAL_TABLET | Freq: Every evening | ORAL | Status: DC | PRN

## 2024-08-19 MED ORDER — IPRATROPIUM-ALBUTEROL 0.5-2.5 (3) MG/3ML IN SOLN: 3.0000 mL | Freq: Four times a day (QID) | RESPIRATORY_TRACT | Status: DC | PRN

## 2024-08-19 MED ORDER — TAMSULOSIN HCL 0.4 MG PO CAPS
0.4000 mg | ORAL_CAPSULE | Freq: Every day | ORAL | Status: DC
  Filled 2024-08-19 (×2): qty 1

## 2024-08-19 MED ORDER — POTASSIUM CHLORIDE IN NACL 20-0.9 MEQ/L-% IV SOLN
INTRAVENOUS | Status: DC
  Filled 2024-08-19 (×3): qty 1000

## 2024-08-19 MED ORDER — SODIUM CHLORIDE 0.9 % IV SOLN
100.0000 mg | Freq: Two times a day (BID) | INTRAVENOUS | Status: DC
  Administered 2024-08-19 – 2024-08-20 (×2): 100 mg via INTRAVENOUS
  Filled 2024-08-19 (×2): qty 100

## 2024-08-19 MED ORDER — ACETAMINOPHEN 650 MG RE SUPP: 650.0000 mg | Freq: Four times a day (QID) | RECTAL | Status: DC | PRN

## 2024-08-19 MED ORDER — QUETIAPINE FUMARATE 25 MG PO TABS
25.0000 mg | ORAL_TABLET | Freq: Every day | ORAL | Status: DC
  Filled 2024-08-19: qty 1

## 2024-08-19 MED ORDER — PROCHLORPERAZINE EDISYLATE 10 MG/2ML IJ SOLN: 5.0000 mg | Freq: Four times a day (QID) | INTRAMUSCULAR | Status: DC | PRN

## 2024-08-19 MED ORDER — VANCOMYCIN VARIABLE DOSE PER UNSTABLE RENAL FUNCTION (PHARMACIST DOSING): Status: DC

## 2024-08-19 MED ORDER — METRONIDAZOLE 500 MG/100ML IV SOLN
500.0000 mg | Freq: Two times a day (BID) | INTRAVENOUS | Status: DC
  Administered 2024-08-19: 500 mg via INTRAVENOUS
  Filled 2024-08-19: qty 100

## 2024-08-19 MED ORDER — POTASSIUM CHLORIDE CRYS ER 20 MEQ PO TBCR: 20.0000 meq | EXTENDED_RELEASE_TABLET | Freq: Once | ORAL | Status: DC

## 2024-08-19 MED ORDER — ACETAMINOPHEN 325 MG PO TABS: 650.0000 mg | ORAL_TABLET | Freq: Four times a day (QID) | ORAL | Status: DC | PRN

## 2024-08-19 MED ORDER — HALOPERIDOL LACTATE 5 MG/ML IJ SOLN
1.0000 mg | Freq: Four times a day (QID) | INTRAMUSCULAR | Status: DC | PRN
  Administered 2024-08-19 – 2024-08-20 (×3): 1 mg via INTRAVENOUS
  Filled 2024-08-19 (×3): qty 1

## 2024-08-19 MED ORDER — SIMVASTATIN 20 MG PO TABS
40.0000 mg | ORAL_TABLET | Freq: Every day | ORAL | Status: DC
  Administered 2024-08-22 – 2024-08-23 (×2): 40 mg via ORAL
  Filled 2024-08-19 (×4): qty 2

## 2024-08-19 MED ORDER — CARVEDILOL 3.125 MG PO TABS
3.1250 mg | ORAL_TABLET | Freq: Two times a day (BID) | ORAL | Status: DC
  Administered 2024-08-20 – 2024-08-24 (×6): 3.125 mg via ORAL
  Filled 2024-08-19 (×8): qty 1

## 2024-08-19 MED ORDER — LACTATED RINGERS IV SOLN: INTRAVENOUS | Status: DC

## 2024-08-19 MED ORDER — PANTOPRAZOLE SODIUM 40 MG IV SOLR
40.0000 mg | INTRAVENOUS | Status: DC
  Administered 2024-08-19 – 2024-08-23 (×5): 40 mg via INTRAVENOUS
  Filled 2024-08-19 (×5): qty 10

## 2024-08-19 MED ORDER — VANCOMYCIN HCL 750 MG/150ML IV SOLN
750.0000 mg | INTRAVENOUS | Status: DC
  Filled 2024-08-19: qty 150

## 2024-08-19 MED ORDER — PANTOPRAZOLE SODIUM 40 MG PO TBEC: 40.0000 mg | DELAYED_RELEASE_TABLET | Freq: Every day | ORAL | Status: DC

## 2024-08-19 MED ORDER — SODIUM CHLORIDE 0.9 % IV SOLN: 500.0000 mg | INTRAVENOUS | Status: DC

## 2024-08-19 MED ORDER — SODIUM CHLORIDE 0.9 % IV SOLN
2.0000 g | Freq: Two times a day (BID) | INTRAVENOUS | Status: DC
  Administered 2024-08-19: 2 g via INTRAVENOUS
  Filled 2024-08-19: qty 12.5

## 2024-08-19 MED ORDER — SODIUM CHLORIDE 0.9% FLUSH: 10.0000 mL | INTRAVENOUS | Status: DC | PRN

## 2024-08-19 MED ORDER — OLANZAPINE 10 MG IM SOLR
2.5000 mg | Freq: Once | INTRAMUSCULAR | Status: AC
  Administered 2024-08-19: 2.5 mg via INTRAMUSCULAR
  Filled 2024-08-19 (×2): qty 10

## 2024-08-19 MED ORDER — SODIUM CHLORIDE 0.9% FLUSH
3.0000 mL | Freq: Two times a day (BID) | INTRAVENOUS | Status: DC
  Administered 2024-08-19 – 2024-08-22 (×7): 3 mL via INTRAVENOUS

## 2024-08-19 NOTE — ED Notes (Signed)
 Pt cleaned of urinary incontinence. Brief and chux changed. Pt continues to be confused and agitated, trying to get out of bed, pulling at lines and tubes.

## 2024-08-19 NOTE — ED Notes (Signed)
 Pt trying to get out of bed, appears anxious, difficult to redirect. Provider made aware, will medicate as ordered.

## 2024-08-19 NOTE — ED Notes (Signed)
 When you have time, Greig Seton (daughter) [phone] would like an update on pt. Thank you

## 2024-08-19 NOTE — Progress Notes (Signed)
 Pharmacy Antibiotic Note  Leon Estes is a 89 y.o. male admitted on 08/18/2024 with sepsis.  Pharmacy has been consulted for vancomycin  and cefepime  dosing.  Pt with AKI; baseline SCr ~1-1.1, now 1.4.  Plan: Monitor SCr prior to redosing vanc; consider 750-1000mg  IV Q24H if renal function improves. Cefepime  2g IV Q12H.  Height: 5' 8 (172.7 cm) Weight: 63.5 kg (140 lb) IBW/kg (Calculated) : 68.4  Temp (24hrs), Avg:99.3 F (37.4 C), Min:98.8 F (37.1 C), Max:99.8 F (37.7 C)  Recent Labs  Lab 08/18/24 2016 08/18/24 2017 08/18/24 2031 08/19/24 0246  WBC  --   --  15.8*  --   CREATININE 1.50*  --  1.41*  --   LATICACIDVEN  --  2.4*  --  1.9    Estimated Creatinine Clearance: 30.6 mL/min (A) (by C-G formula based on SCr of 1.41 mg/dL (H)).    Allergies[1]   Thank you for allowing pharmacy to be a part of this patients care.  Marvetta Dauphin, PharmD, BCPS  08/19/2024 4:05 AM     [1]  Allergies Allergen Reactions   Augmentin  [Amoxicillin -Pot Clavulanate] Rash   Bacitracin-Polymyxin B Swelling and Rash   Benzalkonium Chloride     Benzalkonium chloride is a quaternary ammonium antiseptic and disinfectant with actions and uses similar to those of other cationic surfactants. It is also used as an antimicrobial preservative for pharmaceutical products.   Neosporin [Neomycin-Bacitracin Zn-Polymyx] Rash   Sulfamethoxazole -Trimethoprim  Rash   Terbinafine And Related Rash

## 2024-08-19 NOTE — ED Notes (Signed)
 Pt transported back to ED from MRI. Sitter remains with patient.

## 2024-08-19 NOTE — ED Notes (Signed)
 Unable to collect blood cultures at this time. Pt still agitated and physically aggressive with staff. Tried x2. Phlebotomy notified.

## 2024-08-19 NOTE — ED Notes (Signed)
 Pt transported to MRI

## 2024-08-19 NOTE — Progress Notes (Signed)

## 2024-08-19 NOTE — H&P (Signed)
 " History and Physical    Leon Estes FMW:[id number] DOB: 01/30/1931 DOA: 08/18/2024  PCP: Jimmy Charlie FERNS, MD   Patient coming from: Home   Chief Complaint: AMS   HPI: Leon Estes is a 89 y.o. male with medical history significant for hypertension, hyperlipidemia, ischemic cardiomyopathy, PAF not anticoagulated, CKD 3A, CAD, and COPD who presents with altered mental status.  EMS was called yesterday evening due to the patient having a witnessed fall and altered mental status.  He was said to be in his usual condition around 3 PM but fell at approximately 5 PM, did not appear to hit his head or suffer any appreciable trauma, but has had worsening confusion since then.  Patient was severely agitated and combative in the ED; he was treated with 2 doses of IV Ativan  and is somnolent at time of admission.  ED Course: Upon arrival to the ED, patient is found to be afebrile and saturating low 90s on room air with transient tachypnea, normal HR, and stable BP.  Labs are most notable for potassium 3.3, creatinine 1.41, WBC 15,800, and lactic acid 2.4.  EKG demonstrates atrial fibrillation and chest x-ray is notable for mild peribronchial thickening and interstitial prominence which could reflect bronchitis or atypical infection.  There were no acute findings on CT cervical spine.  There were no acute intracranial findings on head CT but the study was notable for polypoid mass extending from the left maxillary sinus into the left nasal passage with near complete opacification of the left maxillary sinus.  ENT (Dr. Jesus) was consulted by the ED physician and recommended outpatient follow-up.  Blood cultures were collected in the ED and the patient was given 2.5 L IVF, vancomycin , cefepime , Flagyl , and Ativan  x 2.  Review of Systems:  ROS limited by patient's clinical condition.  Past Medical History:  Diagnosis Date   Asthma    BPH (benign prostatic hypertrophy)    Cataract    Chronic renal  disease, stage III (HCC)    Chronic sinusitis    COPD (chronic obstructive pulmonary disease) (HCC)    Coronary artery disease    2 stents   Gall stones    Hearing loss    DOES NOT WEAR HEARING AIDS   High cholesterol    Hypertension    Lactic acid acidosis 10/12/2021   Macular degeneration    right eye   Rash 12/04/2021   Septic shock (HCC) 10/10/2021    Past Surgical History:  Procedure Laterality Date   APPENDECTOMY     CORONARY ANGIOPLASTY WITH STENT PLACEMENT  1998   2 stents   TRANSURETHRAL RESECTION OF PROSTATE      Social History:   reports that he quit smoking about 19 years ago. His smoking use included cigarettes. He has been exposed to tobacco smoke. His smokeless tobacco use includes chew. He reports that he does not drink alcohol  and does not use drugs.  Allergies[1]  Family History  Problem Relation Age of Onset   Diabetes Father    Heart disease Brother      Prior to Admission medications  Medication Sig Start Date End Date Taking? Authorizing Provider  acetaminophen  (TYLENOL ) 500 MG tablet Take 1,000 mg by mouth every 6 (six) hours as needed for moderate pain (pain score 4-6) or headache.    [provider]  acetaminophen  (TYLENOL ) 650 MG CR tablet Take 1,300 mg by mouth every 8 (eight) hours as needed for pain.    [provider]  aspirin  81 MG EC tablet Take 1 tablet (81 mg total) by mouth daily. Swallow whole. 10/18/21   Vernon Ranks, MD  budesonide  (PULMICORT ) 0.5 MG/2ML nebulizer solution TAKE TWO MLS (0.5 MG TOTAL) BY NEBULIZATION IN THE MORNING AND AT BEDTIME 09/23/23   Jimmy Ade I, MD  carvedilol  (COREG ) 3.125 MG tablet TAKE ONE TABLET BY MOUTH TWICE A DAY WITH A MEAL 02/27/24   Nahser, Aleene PARAS, MD  furosemide  (LASIX ) 20 MG tablet Take 1 tablet (20 mg total) by mouth daily. 11/07/21   Parthenia Olivia HERO, PA-C  gabapentin  (NEURONTIN ) 400 MG capsule Take 1-2 capsules (400-800 mg total) by mouth at bedtime. 04/29/24   Webb, Padonda B,  FNP  ipratropium-albuterol  (DUONEB) 0.5-2.5 (3) MG/3ML SOLN TAKE THREE MLS (1 VIAL) BY NEBULIZATION THREE TO FOUR TIMES DAILY AS NEEDED 02/26/24   Jimmy Ade FERNS, MD  losartan  (COZAAR ) 25 MG tablet Take 0.5 tablets (12.5 mg total) by mouth daily. 10/25/23   Nahser, Aleene PARAS, MD  Multiple Vitamin (MULTIVITAMIN WITH MINERALS) TABS tablet Take 1 tablet by mouth daily. 01/23/21   Will Almarie MATSU, MD  nitroGLYCERIN  (NITROSTAT ) 0.4 MG SL tablet Place 1 tablet (0.4 mg total) under the tongue every 5 (five) minutes as needed for chest pain. 10/10/23   Letvak, Richard I, MD  ondansetron  (ZOFRAN -ODT) 4 MG disintegrating tablet Take 1 tablet (4 mg total) by mouth every 8 (eight) hours as needed for nausea or vomiting. 04/09/24   Vincente Shivers, NP  pantoprazole  (PROTONIX ) 40 MG tablet TAKE ONE TABLET (40 MG TOTAL) BY MOUTH DAILY. 08/29/23   Jimmy Ade FERNS, MD  potassium chloride  (KLOR-CON ) 10 MEQ tablet TAKE ONE TABLET BY MOUTH ONCE A DAY 09/30/23   Nahser, Aleene PARAS, MD  QUEtiapine  (SEROQUEL ) 25 MG tablet TAKE ONE TABLET BY MOUTH EVERY NIGHT AT BEDTIME 09/30/23   Jimmy Ade FERNS, MD  simvastatin  (ZOCOR ) 40 MG tablet TAKE ONE TABLET BY MOUTH EVERY NIGHT AT BEDTIME 12/23/23   Jimmy Ade FERNS, MD  sucralfate  (CARAFATE ) 1 g tablet Take 1 tablet (1 g total) by mouth 4 (four) times daily. 11/19/23 11/18/24  Jimmy Ade FERNS, MD  tamsulosin  (FLOMAX ) 0.4 MG CAPS capsule TAKE ONE CAPSULE BY MOUTH ONCE DAILY 08/29/23   Jimmy Ade FERNS, MD  traMADol  (ULTRAM ) 50 MG tablet Take 1 tablet (50 mg total) by mouth 2 (two) times daily as needed. 07/16/24   Bowa, Nahosi K, MD  triamcinolone  cream (KENALOG ) 0.1 % APPLY ONE APPLICATION TOPICALLY TWO TIMES DAILY AS NEEDED 06/18/24   Douglass Caul B, FNP    Physical Exam: Vitals:   08/18/24 2300 08/18/24 2330 08/19/24 0129 08/19/24 0130  BP: 134/74 130/63  (!) 154/76  Pulse: 77 73  78  Resp: (!) 26 (!) 24  (!) 33  Temp:   99.8 F (37.7 C)   TempSrc:   Temporal   SpO2: 94%  92%  93%  Weight:      Height:        Constitutional: NAD, no pallor or diaphoresis   Eyes: PERTLA, lids and conjunctivae normal ENMT: Mucous membranes are moist. Posterior pharynx clear of any exudate or lesions.   Neck: supple, no masses  Respiratory: no wheezing, no crackles. No accessory muscle use.  Cardiovascular: S1 & S2 heard, regular rate and rhythm. No extremity edema.  Abdomen: No tenderness, soft. Bowel sounds active.  Musculoskeletal: no clubbing / cyanosis. No joint deformity upper and lower extremities.   Skin: no significant rashes, lesions, ulcers. Warm, dry, well-perfused.  Neurologic: CN 2-12 grossly intact. Moving all extremities. Sedated, sleeping, opens eyes briefly to loud voice.      Labs and Imaging on Admission: I have personally reviewed following labs and imaging studies  CBC: Recent Labs  Lab 08/18/24 2016 08/18/24 2031  WBC  --  15.8*  NEUTROABS  --  10.8*  HGB 17.3* 15.2  HCT 51.0 48.0  MCV  --  92.7  PLT  --  278   Basic Metabolic Panel: Recent Labs  Lab 08/18/24 2016 08/18/24 2031  NA 138 138  K 3.2* 3.3*  CL 96* 95*  CO2  --  29  GLUCOSE 119* 113*  BUN 12 11  CREATININE 1.50* 1.41*  CALCIUM   --  10.1   GFR: Estimated Creatinine Clearance: 30.6 mL/min (A) (by C-G formula based on SCr of 1.41 mg/dL (H)). Liver Function Tests: Recent Labs  Lab 08/18/24 2031  AST 38  ALT 19  ALKPHOS 110  BILITOT 0.8  PROT 8.6*  ALBUMIN  4.5   No results for input(s): LIPASE, AMYLASE in the last 168 hours. No results for input(s): AMMONIA in the last 168 hours. Coagulation Profile: Recent Labs  Lab 08/18/24 2031  INR 1.0   Cardiac Enzymes: No results for input(s): CKTOTAL, CKMB, CKMBINDEX, TROPONINI in the last 168 hours. BNP (last 3 results) No results for input(s): PROBNP in the last 8760 hours. HbA1C: No results for input(s): HGBA1C in the last 72 hours. CBG: Recent Labs  Lab 08/18/24 2009  GLUCAP 140*    Lipid Profile: No results for input(s): CHOL, HDL, LDLCALC, TRIG, CHOLHDL, LDLDIRECT in the last 72 hours. Thyroid  Function Tests: No results for input(s): TSH, T4TOTAL, FREET4, T3FREE, THYROIDAB in the last 72 hours. Anemia Panel: No results for input(s): VITAMINB12, FOLATE, FERRITIN, TIBC, IRON, RETICCTPCT in the last 72 hours. Urine analysis:    Component Value Date/Time   COLORURINE AMBER (A) 08/19/2024 0320   APPEARANCEUR HAZY (A) 08/19/2024 0320   APPEARANCEUR Clear 04/29/2013 1543   LABSPEC 1.032 (H) 08/19/2024 0320   LABSPEC 1.020 04/29/2013 1543   PHURINE 5.0 08/19/2024 0320   GLUCOSEU NEGATIVE 08/19/2024 0320   GLUCOSEU Negative 04/29/2013 1543   HGBUR MODERATE (A) 08/19/2024 0320   BILIRUBINUR SMALL (A) 08/19/2024 0320   BILIRUBINUR Negative 03/14/2022 1506   BILIRUBINUR Negative 04/29/2013 1543   KETONESUR 5 (A) 08/19/2024 0320   PROTEINUR >=300 (A) 08/19/2024 0320   UROBILINOGEN 0.2 03/14/2022 1506   UROBILINOGEN 0.2 03/08/2012 1624   NITRITE NEGATIVE 08/19/2024 0320   LEUKOCYTESUR NEGATIVE 08/19/2024 0320   LEUKOCYTESUR Negative 04/29/2013 1543   Sepsis Labs: @LABRCNTIP (procalcitonin:4,lacticidven:4) ) Recent Results (from the past 240 hours)  Resp panel by RT-PCR (RSV, Flu A&B, Covid) Anterior Nasal Swab     Status: None   Collection Time: 08/18/24 11:16 PM   Specimen: Anterior Nasal Swab  Result Value Ref Range Status   SARS Coronavirus 2 by RT PCR NEGATIVE NEGATIVE Final   Influenza A by PCR NEGATIVE NEGATIVE Final   Influenza B by PCR NEGATIVE NEGATIVE Final    Comment: (NOTE) The Xpert Xpress SARS-CoV-2/FLU/RSV plus assay is intended as an aid in the diagnosis of influenza from Nasopharyngeal swab specimens and should not be used as a sole basis for treatment. Nasal washings and aspirates are unacceptable for Xpert Xpress SARS-CoV-2/FLU/RSV testing.  Fact Sheet for  Patients: bloggercourse.com  Fact Sheet for Healthcare Providers: seriousbroker.it  This test is not yet approved or cleared by the [place]  FDA and has been authorized for  detection and/or diagnosis of SARS-CoV-2 by FDA under an Emergency Use Authorization (EUA). This EUA will remain in effect (meaning this test can be used) for the duration of the COVID-19 declaration under Section 564(b)(1) of the Act, 21 U.S.C. section 360bbb-3(b)(1), unless the authorization is terminated or revoked.     Resp Syncytial Virus by PCR NEGATIVE NEGATIVE Final    Comment: (NOTE) Fact Sheet for Patients: bloggercourse.com  Fact Sheet for Healthcare Providers: seriousbroker.it  This test is not yet approved or cleared by the [place]  FDA and has been authorized for detection and/or diagnosis of SARS-CoV-2 by FDA under an Emergency Use Authorization (EUA). This EUA will remain in effect (meaning this test can be used) for the duration of the COVID-19 declaration under Section 564(b)(1) of the Act, 21 U.S.C. section 360bbb-3(b)(1), unless the authorization is terminated or revoked.  Performed at [facility], [address] [place], [place] [address]      Radiological Exams on Admission: DG Chest Port 1 View Result Date: 08/19/2024 EXAM: 1 VIEW(S) XRAY OF THE CHEST 08/19/2024 12:32:00 AM COMPARISON: None available. CLINICAL HISTORY: ALOC FINDINGS: LUNGS AND PLEURA: Mild peribronchial thickening and interstitial prominence. No focal pulmonary opacity. No pleural effusion. No pneumothorax. HEART AND MEDIASTINUM: Aortic atherosclerosis. No acute abnormality of the cardiac and mediastinal silhouettes. BONES AND SOFT TISSUES: No acute osseous abnormality. IMPRESSION: 1. Mild peribronchial thickening and interstitial prominence could reflect bronchitis or atypypical infection. . Electronically  signed by: Franky Crease MD 08/19/2024 12:34 AM EST RP Workstation: [id number]   CT HEAD WO CONTRAST Result Date: 08/18/2024 EXAM: CT HEAD WITHOUT CONTRAST 08/18/2024 09:56:07 PM TECHNIQUE: CT of the head was performed without the administration of intravenous contrast. Automated exposure control, iterative reconstruction, and/or weight based adjustment of the mA/kV was utilized to reduce the radiation dose to as low as reasonably achievable. COMPARISON: Comparison is made with head CT from 10/09/2021. CLINICAL HISTORY: Neuro deficit, acute, stroke suspected. FINDINGS: BRAIN AND VENTRICLES: No acute hemorrhage. No evidence of acute infarct. There is a small chronic lacunar infarct in the right parietal deep white matter. No old territorial infarct is seen. There is moderately developed cerebral atrophy, with small vessel disease and atrophic ventriculomegaly, relatively mild cerebellar volume loss. There are calcified plaques in the distal left vertebral artery and both siphons. No hyperdense vessel is seen. No extra-axial collection. No mass effect or midline shift. ORBITS: Old right-sided lens replacement with otherwise negative orbits. SINUSES: There is a new polypoid mass which appears to extend from the left maxillary sinus cavity into the mid to lower left nasal passage and widens the left maxillary antrum. There is near complete opacification of the left maxillary sinus with fluid and mucosal disease. There is some demineralization noted in the left maxillary sinus walls, but no frank bone destruction. There is partial opacification of the left ethmoid air cells. Remaining visualized sinuses are clear. Left mastoids are clear. There is trace fluid in the right mastoid tip. SOFT TISSUES AND SKULL: No acute soft tissue abnormality. No skull fracture. IMPRESSION: 1. No acute intracranial CT findings. 2. New polypoid mass extending from the left maxillary sinus cavity into the mid to lower left nasal passage,  associated with near complete opacification of the left maxillary sinus and partial opacification of the left ethmoid air cells, with some demineralization of the left maxillary sinus walls without frank bone destruction. Benign and malignant etiologies are both possible. ENT consult recommended. 3. Small chronic lacunar infarct in the right parietal  deep white matter. 4. Moderate cerebral atrophy with small vessel disease and atrophic ventriculomegaly, with relatively mild cerebellar volume loss. Electronically signed by: Francis Quam MD 08/18/2024 10:21 PM EST RP Workstation: [id number]   CT Cervical Spine Wo Contrast Result Date: 08/18/2024 EXAM: CT CERVICAL SPINE WITHOUT CONTRAST 08/18/2024 09:59:26 PM TECHNIQUE: CT of the cervical spine was performed without the administration of intravenous contrast. Multiplanar reformatted images are provided for review. Automated exposure control, iterative reconstruction, and/or weight based adjustment of the mA/kV was utilized to reduce the radiation dose to as low as reasonably achievable. COMPARISON: None available. CLINICAL HISTORY: Polytrauma, blunt. FINDINGS: BONES AND ALIGNMENT: No acute fracture or traumatic malalignment. 2 mm of anterolisthesis of C2 on C3, C6 on C7, and C7 on T1. DEGENERATIVE CHANGES: Diffuse degenerative disc and facet disease. SOFT TISSUES: No prevertebral soft tissue swelling. LUNGS: Biapical scarring and emphysema noted in the lung apices. IMPRESSION: 1. No acute cervical spine fracture or traumatic malalignment. Electronically signed by: Franky Crease MD 08/18/2024 10:18 PM EST RP Workstation: [id number]    EKG: Independently reviewed. Atrial fibrillation, RBBB, LVH.   Assessment/Plan   1. Acute encephalopathy  - No acute findings on head CT    - Suspect this is secondary to dehydration and possible infection  - Check TSH, ammonia, B12, and RPR, continue IVF hydration, continue empiric antibiotics, use delirium precautions,  consider MRI brain    2. SIRS - Leukocytosis and tachypnea present on admission  - Blood was cultured in ED and broad-spectrum antibiotics started  - Check procalcitonin, follow cultures and clinical course, continue antibiotics for now   3. AKI  - He has been fluid-resuscitated in the ED  - Hold Lasix  and losartan , renally-dose medications, repeat chem panel in am    4. Hypokalemia  - Replacing, will repeat chem panel    5. COPD  - Not in exacerbation  - Continue DuoNebs as-needed   6. Chronic HFmrEF  - EF was 45% on TTE from September 2025  - Appears compensated  - Hold Lasix  and losartan  initially in light of AKI, continue Coreg  as tolerated, monitor weight and I/Os   7. PAF  - Follows with [facility]ology, not anticoagulated due to falls  - Continue Coreg     8. Dementia  - Use delirium precautions    9. Left maxillary sinus mass  - Dr. Jesus of ENT was consulted by the ED physician and recommended outpatient follow-up    DVT prophylaxis: Lovenox   Code Status: DNR/DNI  Level of Care: Level of care: Telemetry Family Communication: None available at time of admission   Disposition Plan:  Patient is from: Home  Anticipated d/c is to: TBD Anticipated d/c date is: 08/21/24 Patient currently: Pending improved mental status, disposition planning  Consults called: None  Admission status: Inpatient     Evalene GORMAN Sprinkles, MD [facility]  08/19/2024, 3:59 AM       [1]  Allergies Allergen Reactions   Augmentin  [Amoxicillin -Pot Clavulanate] Rash   Bacitracin-Polymyxin B Swelling and Rash   Benzalkonium Chloride     Benzalkonium chloride is a quaternary ammonium antiseptic and disinfectant with actions and uses similar to those of other cationic surfactants. It is also used as an antimicrobial preservative for pharmaceutical products.   Neosporin [Neomycin-Bacitracin Zn-Polymyx] Rash   Sulfamethoxazole -Trimethoprim  Rash   Terbinafine And Related Rash   "

## 2024-08-19 NOTE — Progress Notes (Signed)
 Called patient's wife, Sabatino Williard, to complete admission profile.

## 2024-08-19 NOTE — ED Notes (Signed)
 Secure chat sent to MD regarding worsening respiratory status. Pt is tachypneic (RR 32), tachycardic (HR 110-115), and work of breathing increased from prior with use of accessory muscles and mild grunting. Patient continues to be agitated, confused, and is not redirectable. Sitter remains at bedside attempting to deescalate patient and redirect patient to stay in bed. MD to come assess patient.

## 2024-08-19 NOTE — ED Notes (Signed)
 Pt cleaned of urinary incontinence. Continues to be combative with staff and does not cooperate with staff changing/repositioning him.

## 2024-08-19 NOTE — Progress Notes (Signed)
 "        [facility]                                                                               Leon Estes, is a 89 y.o. male, DOB - 1931/05/05, FMW:[id number] Admit date - 08/18/2024    Outpatient Primary MD for the patient is Leon Ade I, MD  LOS - 0  days    Brief summary   Leon Estes is a 89 y.o. male with medical history significant for hypertension, hyperlipidemia, ischemic cardiomyopathy, PAF not anticoagulated, CKD 3A, CAD, and COPD who presents with altered mental status.     Assessment & Plan    Assessment and Plan:  Acute metabolic encephalopathy of unclear etiology.  Suspect from dehydration from poor oral intake.  As per the daughter , patient's dog passed away recently and patient was grieving and hasn't had anything to eat for 2 days.  Yesterday he had a fall and has been confused since then.  So far work up with CT head and MRI brain was negative for acute findings.  UA is negative for infection  CXR shows possible acute bronchitis vs atypical pneumonia.  He was started on IV cefepime .  HIs mental status worsening in the last 12 hours after he got 2 doses of IV ativan  and IV cefepime .  Will d/c cefepime  and change to IV azithromycin .  Continue with IV fluids ( gentle hydration).        Estimated body mass index is 21.29 kg/m as calculated from the following:   Height as of this encounter: 5' 8 (1.727 m).   Weight as of this encounter: 63.5 kg.  Code Status: DNR DVT Prophylaxis:  enoxaparin  (LOVENOX ) injection 40 mg Start: 08/19/24 1400   Level of Care: Level of care: Telemetry Family Communication: Updated patient's daughter over the phone and at bedside.   Disposition Plan:     Remains inpatient appropriate:  [facility].   Procedures:  None.   Consultants:   None.   Antimicrobials:   Anti-infectives (From admission, onward)    Start     Dose/Rate Route Frequency Ordered Stop   08/20/24 0500  vancomycin  (VANCOREADY) IVPB  750 mg/150 mL        750 mg 150 mL/hr over 60 Minutes Intravenous Every 24 hours 08/19/24 0922     08/19/24 1200  ceFEPIme  (MAXIPIME ) 2 g in sodium chloride  0.9 % 100 mL IVPB        2 g 200 mL/hr over 30 Minutes Intravenous Every 12 hours 08/19/24 0408     08/19/24 1000  metroNIDAZOLE  (FLAGYL ) IVPB 500 mg        500 mg 100 mL/hr over 60 Minutes Intravenous Every 12 hours 08/19/24 0359 08/25/24 2159   08/19/24 0408  vancomycin  variable dose per unstable renal function (pharmacist dosing)  Status:  Discontinued         Does not apply See admin instructions 08/19/24 0408 08/19/24 0922   08/18/24 2345  vancomycin  (VANCOREADY) IVPB 1500 mg/300 mL        1,500 mg 150 mL/hr over 120 Minutes Intravenous  Once 08/18/24 2331 08/19/24 0451   08/18/24 2345  ceFEPIme  (  MAXIPIME ) 2 g in sodium chloride  0.9 % 100 mL IVPB        2 g 200 mL/hr over 30 Minutes Intravenous  Once 08/18/24 2332 08/19/24 0127   08/18/24 2330  aztreonam  (AZACTAM ) 2 g in sodium chloride  0.9 % 100 mL IVPB  Status:  Discontinued        2 g 200 mL/hr over 30 Minutes Intravenous  Once 08/18/24 2326 08/18/24 2331   08/18/24 2330  metroNIDAZOLE  (FLAGYL ) IVPB 500 mg        500 mg 100 mL/hr over 60 Minutes Intravenous  Once 08/18/24 2326 08/19/24 0230   08/18/24 2330  vancomycin  (VANCOCIN ) IVPB 1000 mg/200 mL premix  Status:  Discontinued        1,000 mg 200 mL/hr over 60 Minutes Intravenous  Once 08/18/24 2326 08/18/24 2331        Medications  Scheduled Meds:  carvedilol   3.125 mg Oral BID WC   enoxaparin  (LOVENOX ) injection  40 mg Subcutaneous Q24H   pantoprazole  (PROTONIX ) IV  40 mg Intravenous Q24H   potassium chloride   20 mEq Oral Once   QUEtiapine   25 mg Oral QHS   simvastatin   40 mg Oral QHS   sodium chloride  flush  3 mL Intravenous Q12H   tamsulosin   0.4 mg Oral Daily   Continuous Infusions:  ceFEPime  (MAXIPIME ) IV Stopped (08/19/24 1246)   lactated ringers      metronidazole  Stopped (08/19/24 1044)   [START ON  08/20/2024] vancomycin      PRN Meds:.acetaminophen  **OR** acetaminophen , haloperidol  lactate, ipratropium-albuterol , LORazepam , prochlorperazine , senna-docusate    Subjective:   Leon Estes was seen and examined today.   Confused and restless.   Objective:   Vitals:   08/19/24 0507 08/19/24 0909 08/19/24 1451 08/19/24 1614  BP:  (!) 154/72 (!) 161/54 (!) 122/95  Pulse: 75 84 82 (!) 110  Resp: (!) 25 20 18  (!) 32  Temp:  98.1 F (36.7 C) 98 F (36.7 C)   TempSrc:  Axillary Axillary   SpO2: 93% 94% 97% 91%  Weight:      Height:        Intake/Output Summary (Last 24 hours) at 08/19/2024 1647 Last data filed at 08/19/2024 1246 Gross per 24 hour  Intake 3193.99 ml  Output --  Net 3193.99 ml   Filed Weights   08/18/24 2003  Weight: 63.5 kg     Exam General exam: ill appearing elderly gentleman, restless.  Respiratory system: Clear to auscultation. Respiratory effort normal. Cardiovascular system: S1 & S2 heard,tachycardic.  Gastrointestinal system: Abdomen is nondistended, soft and nontender.  Central nervous system: Alert but confused, delirious.  Extremities: no edema.  Skin: No rashes,  Psychiatry: unable to assess due to AMS.     Data Reviewed:  Estes have personally reviewed following labs and imaging studies   CBC Lab Results  Component Value Date   WBC 12.6 (H) 08/19/2024   RBC 3.81 (L) 08/19/2024   HGB 11.2 (L) 08/19/2024   HCT 35.5 (L) 08/19/2024   MCV 93.2 08/19/2024   MCH 29.4 08/19/2024   PLT 216 08/19/2024   MCHC 31.5 08/19/2024   RDW 15.0 08/19/2024   LYMPHSABS 2.8 08/18/2024   MONOABS 2.0 (H) 08/18/2024   EOSABS 0.0 08/18/2024   BASOSABS 0.1 08/18/2024     Last metabolic panel Lab Results  Component Value Date   NA 137 08/19/2024   K 3.3 (L) 08/19/2024   CL 101 08/19/2024   CO2 27 08/19/2024   BUN 14 08/19/2024  CREATININE 1.16 08/19/2024   GLUCOSE 91 08/19/2024   GFRNONAA 59 (L) 08/19/2024   GFRAA 42 (L) 05/04/2017   CALCIUM  8.5  (L) 08/19/2024   PHOS 2.6 08/10/2022   PROT 8.6 (H) 08/18/2024   ALBUMIN  4.5 08/18/2024   BILITOT 0.8 08/18/2024   ALKPHOS 110 08/18/2024   AST 38 08/18/2024   ALT 19 08/18/2024   ANIONGAP 10 08/19/2024    CBG (last 3)  Recent Labs    08/18/24 2009  GLUCAP 140*      Coagulation Profile: Recent Labs  Lab 08/18/24 2031  INR 1.0     Radiology Studies: MR BRAIN WO CONTRAST Result Date: 08/19/2024 EXAM: MRI BRAIN WITHOUT CONTRAST 08/19/2024 01:26:03 PM TECHNIQUE: Multiplanar multisequence MRI of the head/brain was performed without the administration of intravenous contrast. COMPARISON: CT head 08/18/2024. CLINICAL HISTORY: Mental status change, unknown cause. FINDINGS: BRAIN AND VENTRICLES: No acute infarct. No intracranial hemorrhage. No mass. No midline shift. No hydrocephalus. T2 and FLAIR hyperintensity in the periventricular and subcortical white matter compatible with mild chronic microvascular ischemic changes. Mild generalized volume loss. The sella is unremarkable. Normal flow voids. ORBITS: Right lens replacement. SINUSES AND MASTOIDS: Mucosal thickening throughout the left maxillary sinus resulting in complete opacification. Additional moderate mucosal thickening in the left ethmoid sinus. There is soft tissue extending through the left maxillary sinus ostium which could reflect an antrochoanal polyp. Recommend correlation with direct visualization. Small right mastoid effusion. BONES AND SOFT TISSUES: Normal marrow signal. No significant soft tissue abnormality. IMPRESSION: 1. No acute findings. 2. Mild chronic microvascular ischemic changes and mild generalized volume loss. 3. Complete left maxillary sinus opacification with soft tissue extending through the left maxillary sinus ostium, which could reflect an antrochoanal polyp. Recommend direct visualization. Electronically signed by: Donnice Mania MD 08/19/2024 02:05 PM EST RP Workstation: [id number]   DG Chest Port 1  View Result Date: 08/19/2024 EXAM: 1 VIEW(S) XRAY OF THE CHEST 08/19/2024 12:32:00 AM COMPARISON: None available. CLINICAL HISTORY: ALOC FINDINGS: LUNGS AND PLEURA: Mild peribronchial thickening and interstitial prominence. No focal pulmonary opacity. No pleural effusion. No pneumothorax. HEART AND MEDIASTINUM: Aortic atherosclerosis. No acute abnormality of the cardiac and mediastinal silhouettes. BONES AND SOFT TISSUES: No acute osseous abnormality. IMPRESSION: 1. Mild peribronchial thickening and interstitial prominence could reflect bronchitis or atypypical infection. . Electronically signed by: Franky Crease MD 08/19/2024 12:34 AM EST RP Workstation: [id number]   CT HEAD WO CONTRAST Result Date: 08/18/2024 EXAM: CT HEAD WITHOUT CONTRAST 08/18/2024 09:56:07 PM TECHNIQUE: CT of the head was performed without the administration of intravenous contrast. Automated exposure control, iterative reconstruction, and/or weight based adjustment of the mA/kV was utilized to reduce the radiation dose to as low as reasonably achievable. COMPARISON: Comparison is made with head CT from 10/09/2021. CLINICAL HISTORY: Neuro deficit, acute, stroke suspected. FINDINGS: BRAIN AND VENTRICLES: No acute hemorrhage. No evidence of acute infarct. There is a small chronic lacunar infarct in the right parietal deep white matter. No old territorial infarct is seen. There is moderately developed cerebral atrophy, with small vessel disease and atrophic ventriculomegaly, relatively mild cerebellar volume loss. There are calcified plaques in the distal left vertebral artery and both siphons. No hyperdense vessel is seen. No extra-axial collection. No mass effect or midline shift. ORBITS: Old right-sided lens replacement with otherwise negative orbits. SINUSES: There is a new polypoid mass which appears to extend from the left maxillary sinus cavity into the mid to lower left nasal passage and widens the left maxillary antrum. There is near  complete opacification of the left maxillary sinus with fluid and mucosal disease. There is some demineralization noted in the left maxillary sinus walls, but no frank bone destruction. There is partial opacification of the left ethmoid air cells. Remaining visualized sinuses are clear. Left mastoids are clear. There is trace fluid in the right mastoid tip. SOFT TISSUES AND SKULL: No acute soft tissue abnormality. No skull fracture. IMPRESSION: 1. No acute intracranial CT findings. 2. 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 Benign and malignant etiologies are both possible. ENT consult recommended. 3. Small chronic lacunar infarct in the right parietal deep white matter. 4. Moderate cerebral atrophy with small vessel disease and atrophic ventriculomegaly, with relatively mild cerebellar volume loss. Electronically signed by: Francis Quam MD 08/18/2024 10:21 PM EST RP Workstation: [id number]   CT Cervical Spine Wo Contrast Result Date: 08/18/2024 EXAM: CT CERVICAL SPINE WITHOUT CONTRAST 08/18/2024 09:59:26 PM TECHNIQUE: CT of the cervical spine was performed without the administration of intravenous contrast. Multiplanar reformatted images are provided for review. Automated exposure control, iterative reconstruction, and/or weight based adjustment of the mA/kV was utilized to reduce the radiation dose to as low as reasonably achievable. COMPARISON: None available. CLINICAL HISTORY: Polytrauma, blunt. FINDINGS: BONES AND ALIGNMENT: No acute fracture or traumatic malalignment. 2 mm of anterolisthesis of C2 on C3, C6 on C7, and C7 on T1. DEGENERATIVE CHANGES: Diffuse degenerative disc and facet disease. SOFT TISSUES: No prevertebral  soft tissue swelling. LUNGS: Biapical scarring and emphysema noted in the lung apices. IMPRESSION: 1. No acute cervical spine fracture or traumatic malalignment. Electronically signed by: Franky Crease MD 08/18/2024 10:18 PM EST RP Workstation: [id number]       Elgie Butter M.D. [facility] 08/19/2024, 4:47 PM  Available via Epic secure chat 7am-7pm After 7 pm, please refer to night coverage provider listed on amion.    "

## 2024-08-20 ENCOUNTER — Inpatient Hospital Stay (HOSPITAL_COMMUNITY)

## 2024-08-20 ENCOUNTER — Other Ambulatory Visit: Payer: Self-pay

## 2024-08-20 DIAGNOSIS — R4182 Altered mental status, unspecified: Secondary | ICD-10-CM | POA: Diagnosis not present

## 2024-08-20 DIAGNOSIS — I480 Paroxysmal atrial fibrillation: Secondary | ICD-10-CM | POA: Diagnosis not present

## 2024-08-20 DIAGNOSIS — R569 Unspecified convulsions: Secondary | ICD-10-CM | POA: Diagnosis not present

## 2024-08-20 DIAGNOSIS — G9340 Encephalopathy, unspecified: Secondary | ICD-10-CM | POA: Diagnosis not present

## 2024-08-20 DIAGNOSIS — J449 Chronic obstructive pulmonary disease, unspecified: Secondary | ICD-10-CM | POA: Diagnosis not present

## 2024-08-20 DIAGNOSIS — I10 Essential (primary) hypertension: Secondary | ICD-10-CM | POA: Diagnosis not present

## 2024-08-20 LAB — TSH: TSH: 1.7 u[IU]/mL (ref 0.350–4.500)

## 2024-08-20 LAB — BASIC METABOLIC PANEL WITH GFR
Anion gap: 17 — ABNORMAL HIGH (ref 5–15)
BUN: 11 mg/dL (ref 8–23)
CO2: 23 mmol/L (ref 22–32)
Calcium: 8.8 mg/dL — ABNORMAL LOW (ref 8.9–10.3)
Chloride: 99 mmol/L (ref 98–111)
Creatinine, Ser: 0.97 mg/dL (ref 0.61–1.24)
GFR, Estimated: >60 mL/min
Glucose, Bld: 76 mg/dL (ref 70–99)
Potassium: 3.2 mmol/L — ABNORMAL LOW (ref 3.5–5.1)
Sodium: 139 mmol/L (ref 135–145)

## 2024-08-20 LAB — CBC
HCT: 37.2 % — ABNORMAL LOW (ref 39.0–52.0)
Hemoglobin: 12 g/dL — ABNORMAL LOW (ref 13.0–17.0)
MCH: 29.3 pg (ref 26.0–34.0)
MCHC: 32.3 g/dL (ref 30.0–36.0)
MCV: 90.7 fL (ref 80.0–100.0)
Platelets: 235 K/uL (ref 150–400)
RBC: 4.1 MIL/uL — ABNORMAL LOW (ref 4.22–5.81)
RDW: 14.9 % (ref 11.5–15.5)
WBC: 13.6 K/uL — ABNORMAL HIGH (ref 4.0–10.5)
nRBC: 0 % (ref 0.0–0.2)

## 2024-08-20 MED ORDER — QUETIAPINE FUMARATE 50 MG PO TABS: 50.0000 mg | ORAL_TABLET | Freq: Every day | ORAL | Status: DC

## 2024-08-20 MED ORDER — OLANZAPINE 5 MG PO TBDP
5.0000 mg | ORAL_TABLET | Freq: Every day | ORAL | Status: DC
  Filled 2024-08-20 (×2): qty 1

## 2024-08-20 MED ORDER — POTASSIUM CHLORIDE 10 MEQ/100ML IV SOLN
10.0000 meq | INTRAVENOUS | Status: AC
  Administered 2024-08-20 (×3): 10 meq via INTRAVENOUS
  Filled 2024-08-20 (×3): qty 100

## 2024-08-20 MED ORDER — REVEFENACIN 175 MCG/3ML IN SOLN
175.0000 ug | Freq: Every day | RESPIRATORY_TRACT | Status: DC
  Administered 2024-08-20 – 2024-08-24 (×5): 175 ug via RESPIRATORY_TRACT
  Filled 2024-08-20 (×5): qty 3

## 2024-08-20 MED ORDER — IPRATROPIUM-ALBUTEROL 0.5-2.5 (3) MG/3ML IN SOLN
3.0000 mL | RESPIRATORY_TRACT | Status: DC | PRN
  Administered 2024-08-21 (×2): 3 mL via RESPIRATORY_TRACT
  Filled 2024-08-20 (×2): qty 3

## 2024-08-20 MED ORDER — HALOPERIDOL LACTATE 5 MG/ML IJ SOLN
5.0000 mg | Freq: Once | INTRAMUSCULAR | Status: AC
  Administered 2024-08-20: 5 mg via INTRAVENOUS
  Filled 2024-08-20: qty 1

## 2024-08-20 MED ORDER — HYDRALAZINE HCL 20 MG/ML IJ SOLN
10.0000 mg | Freq: Four times a day (QID) | INTRAMUSCULAR | Status: DC | PRN
  Administered 2024-08-20 – 2024-08-23 (×5): 10 mg via INTRAVENOUS
  Filled 2024-08-20 (×5): qty 1

## 2024-08-20 MED ORDER — BUDESONIDE 0.25 MG/2ML IN SUSP
0.2500 mg | Freq: Two times a day (BID) | RESPIRATORY_TRACT | Status: DC
  Administered 2024-08-20 – 2024-08-24 (×8): 0.25 mg via RESPIRATORY_TRACT
  Filled 2024-08-20 (×8): qty 2

## 2024-08-20 MED ORDER — HALOPERIDOL LACTATE 5 MG/ML IJ SOLN
2.0000 mg | Freq: Four times a day (QID) | INTRAMUSCULAR | Status: DC | PRN
  Administered 2024-08-20 – 2024-08-21 (×3): 2 mg via INTRAVENOUS
  Filled 2024-08-20 (×3): qty 1

## 2024-08-20 MED ORDER — LACTATED RINGERS IV SOLN: INTRAVENOUS | Status: DC

## 2024-08-20 MED ORDER — IPRATROPIUM-ALBUTEROL 0.5-2.5 (3) MG/3ML IN SOLN
3.0000 mL | Freq: Three times a day (TID) | RESPIRATORY_TRACT | Status: DC
  Administered 2024-08-20 – 2024-08-21 (×3): 3 mL via RESPIRATORY_TRACT
  Filled 2024-08-20 (×3): qty 3

## 2024-08-20 NOTE — Progress Notes (Addendum)
 "                        PROGRESS NOTE        PATIENT DETAILS Name: Leon Estes Age: 89 y.o. Sex: male Date of Birth: 15-Dec-1930 Admit Date: 08/18/2024 Admitting Physician Evalene GORMAN Sprinkles, MD ERE:Antj, Reuben POUR, MD  Brief Summary: Patient is a 89 y.o.  male with history of HTN, HLD, PAF not anticoagulated, CKD stage IIIa, COPD, dementia/cognitive issues-who presented with altered mental status.  Per family-dog died recently and has had significant decrease in oral intake since then.  Significant events: 1/7>> admit to [facility]  Significant studies: 1/6>> CT head: No acute intracranial normality 1/6>> CT spine: No fracture 1/6>> CXR: Mild peribronchial thickening/interstitial prominence-possible atypical infection/bronchitis. 1/7>> MRI brain: No acute findings 1/7>> TSH: Normal limits 1/7>> vitamin B12: 650 1/7>> RPR: Nonreactive 1/7>> ammonia: Normal  Significant microbiology data: 1/6>> COVID/influenza/RSV PCR: Negative 1/6>> blood culture: No growth  Procedures: None  Consults: None  Subjective: Was agitated last night-but relatively calm/quiet during my exam this morning-does not appear to have any dentures-and speech not clear but able to tell me his name-age-date of birth.  Follows simple commands easily.  Objective: Vitals: Blood pressure 131/79, pulse (!) 108, temperature 98.9 F (37.2 C), temperature source Axillary, resp. rate (!) 22, height 5' 8 (1.727 m), weight 68.8 kg, SpO2 96%.   Exam: Gen Exam: Frail-chronically sick appearing HEENT:atraumatic, normocephalic-neck supple Chest: B/L clear to auscultation anteriorly CVS:S1S2 regular Abdomen:soft non tender, non distended Extremities:no edema Neurology: Generalized weakness but moving all 4 extremities. Skin: no rash  Pertinent Labs/Radiology:    Latest Ref Rng & Units 08/20/2024    4:32 AM 08/19/2024    5:02 AM 08/18/2024    8:31 PM  CBC  WBC 4.0 - 10.5 K/uL 13.6  12.6  15.8   Hemoglobin 13.0 - 17.0  g/dL 87.9  88.7  84.7   Hematocrit 39.0 - 52.0 % 37.2  35.5  48.0   Platelets 150 - 400 K/uL 235  216  278     Lab Results  Component Value Date   NA 139 08/20/2024   K 3.2 (L) 08/20/2024   CL 99 08/20/2024   CO2 23 08/20/2024      Assessment/Plan: Acute metabolic encephalopathy Unclear etiology-suspected dehydration-apparently dog died recently and patient has not eaten or drank since then.  Reviewed outpatient notes-he does have some cognitive issues-and has vision and hearing loss as well. Extensive workup negative so far Neck is supple and exam-doubt meningitis Continue IV fluid hydration EEG Delirium precautions Minimize sedating medications as much as possible Schedule Seroquel /melatonin at night.  AKI  Hemodynamically mediated Resolved Follow electrolytes   Hypokalemia  Replete/recheck   COPD  Not in exacerbation Bronchodilators Stage scheduled bronchodilators   Chronic HFmrEF (EF 45% September 2025) Hold Lasix /losartan -unable to take any oral meds due to AMS Continue to hydrate for another 24 hours-watch for volume overload.  PAF  Follows with [facility], not anticoagulated due to falls  Continue Coreg      Dementia  Use delirium precautions   See above   Complete opacification of left maxillary sinus with soft tissue extending through the ostium-likely reflecting a polyp ED MD discussed with ENT (Dr. Earlie follow-up is recommended  Code status:   Code Status: Limited: Do not attempt resuscitation (DNR) -DNR-LIMITED -Do Not Intubate/DNI    DVT Prophylaxis: enoxaparin  (LOVENOX ) injection 40 mg Start: 08/19/24 1400   Family Communication: Spouse/daughter  at bedside   Disposition Plan: Status is: Inpatient Remains inpatient appropriate because: Severity of illness   Planned Discharge Destination:Home health   Diet: Diet Order             Diet NPO time specified  Diet effective now                     Antimicrobial  agents: Anti-infectives (From admission, onward)    Start     Dose/Rate Route Frequency Ordered Stop   08/20/24 0500  vancomycin  (VANCOREADY) IVPB 750 mg/150 mL  Status:  Discontinued        750 mg 150 mL/hr over 60 Minutes Intravenous Every 24 hours 08/19/24 0922 08/19/24 1856   08/19/24 2000  doxycycline  (VIBRAMYCIN ) 100 mg in sodium chloride  0.9 % 250 mL IVPB        100 mg 125 mL/hr over 120 Minutes Intravenous Every 12 hours 08/19/24 1910 08/24/24 0759   08/19/24 1945  azithromycin  (ZITHROMAX ) 500 mg in sodium chloride  0.9 % 250 mL IVPB  Status:  Discontinued        500 mg 250 mL/hr over 60 Minutes Intravenous Every 24 hours 08/19/24 1856 08/19/24 1910   08/19/24 1200  ceFEPIme  (MAXIPIME ) 2 g in sodium chloride  0.9 % 100 mL IVPB  Status:  Discontinued        2 g 200 mL/hr over 30 Minutes Intravenous Every 12 hours 08/19/24 0408 08/19/24 1856   08/19/24 1000  metroNIDAZOLE  (FLAGYL ) IVPB 500 mg  Status:  Discontinued        500 mg 100 mL/hr over 60 Minutes Intravenous Every 12 hours 08/19/24 0359 08/19/24 1856   08/19/24 0408  vancomycin  variable dose per unstable renal function (pharmacist dosing)  Status:  Discontinued         Does not apply See admin instructions 08/19/24 0408 08/19/24 0922   08/18/24 2345  vancomycin  (VANCOREADY) IVPB 1500 mg/300 mL        1,500 mg 150 mL/hr over 120 Minutes Intravenous  Once 08/18/24 2331 08/19/24 0451   08/18/24 2345  ceFEPIme  (MAXIPIME ) 2 g in sodium chloride  0.9 % 100 mL IVPB        2 g 200 mL/hr over 30 Minutes Intravenous  Once 08/18/24 2332 08/19/24 0127   08/18/24 2330  aztreonam  (AZACTAM ) 2 g in sodium chloride  0.9 % 100 mL IVPB  Status:  Discontinued        2 g 200 mL/hr over 30 Minutes Intravenous  Once 08/18/24 2326 08/18/24 2331   08/18/24 2330  metroNIDAZOLE  (FLAGYL ) IVPB 500 mg        500 mg 100 mL/hr over 60 Minutes Intravenous  Once 08/18/24 2326 08/19/24 0230   08/18/24 2330  vancomycin  (VANCOCIN ) IVPB 1000 mg/200 mL premix   Status:  Discontinued        1,000 mg 200 mL/hr over 60 Minutes Intravenous  Once 08/18/24 2326 08/18/24 2331        MEDICATIONS: Scheduled Meds:  carvedilol   3.125 mg Oral BID WC   enoxaparin  (LOVENOX ) injection  40 mg Subcutaneous Q24H   pantoprazole  (PROTONIX ) IV  40 mg Intravenous Q24H   QUEtiapine   25 mg Oral QHS   simvastatin   40 mg Oral QHS   sodium chloride  flush  10-40 mL Intracatheter Q12H   sodium chloride  flush  3 mL Intravenous Q12H   tamsulosin   0.4 mg Oral Daily   Continuous Infusions:  doxycycline  (VIBRAMYCIN ) IV 100 mg (08/20/24 0943)   lactated ringers   50 mL/hr at 08/20/24 0634   PRN Meds:.acetaminophen  **OR** acetaminophen , haloperidol  lactate, hydrALAZINE , ipratropium-albuterol , LORazepam , prochlorperazine , senna-docusate, sodium chloride  flush   I have personally reviewed following labs and imaging studies  LABORATORY DATA: CBC: Recent Labs  Lab 08/18/24 2016 08/18/24 2031 08/19/24 0502 08/20/24 0432  WBC  --  15.8* 12.6* 13.6*  NEUTROABS  --  10.8*  --   --   HGB 17.3* 15.2 11.2* 12.0*  HCT 51.0 48.0 35.5* 37.2*  MCV  --  92.7 93.2 90.7  PLT  --  278 216 235    Basic Metabolic Panel: Recent Labs  Lab 08/18/24 2016 08/18/24 2031 08/19/24 0502 08/20/24 0432  NA 138 138 137 139  K 3.2* 3.3* 3.3* 3.2*  CL 96* 95* 101 99  CO2  --  29 27 23   GLUCOSE 119* 113* 91 76  BUN 12 11 14 11   CREATININE 1.50* 1.41* 1.16 0.97  CALCIUM   --  10.1 8.5* 8.8*  MG  --   --  1.8  --     GFR: Estimated Creatinine Clearance: 48 mL/min (by C-G formula based on SCr of 0.97 mg/dL).  Liver Function Tests: Recent Labs  Lab 08/18/24 2031  AST 38  ALT 19  ALKPHOS 110  BILITOT 0.8  PROT 8.6*  ALBUMIN  4.5   No results for input(s): LIPASE, AMYLASE in the last 168 hours. Recent Labs  Lab 08/19/24 0358  AMMONIA 26    Coagulation Profile: Recent Labs  Lab 08/18/24 2031  INR 1.0    Cardiac Enzymes: No results for input(s): CKTOTAL,  CKMB, CKMBINDEX, TROPONINI in the last 168 hours.  BNP (last 3 results) No results for input(s): PROBNP in the last 8760 hours.  Lipid Profile: No results for input(s): CHOL, HDL, LDLCALC, TRIG, CHOLHDL, LDLDIRECT in the last 72 hours.  Thyroid  Function Tests: Recent Labs    08/20/24 0432  TSH 1.700    Anemia Panel: Recent Labs    08/19/24 0502  VITAMINB12 650    Urine analysis:    Component Value Date/Time   COLORURINE AMBER (A) 08/19/2024 0320   APPEARANCEUR HAZY (A) 08/19/2024 0320   APPEARANCEUR Clear 04/29/2013 1543   LABSPEC 1.032 (H) 08/19/2024 0320   LABSPEC 1.020 04/29/2013 1543   PHURINE 5.0 08/19/2024 0320   GLUCOSEU NEGATIVE 08/19/2024 0320   GLUCOSEU Negative 04/29/2013 1543   HGBUR MODERATE (A) 08/19/2024 0320   BILIRUBINUR SMALL (A) 08/19/2024 0320   BILIRUBINUR Negative 03/14/2022 1506   BILIRUBINUR Negative 04/29/2013 1543   KETONESUR 5 (A) 08/19/2024 0320   PROTEINUR >=300 (A) 08/19/2024 0320   UROBILINOGEN 0.2 03/14/2022 1506   UROBILINOGEN 0.2 03/08/2012 1624   NITRITE NEGATIVE 08/19/2024 0320   LEUKOCYTESUR NEGATIVE 08/19/2024 0320   LEUKOCYTESUR Negative 04/29/2013 1543    Sepsis Labs: Lactic Acid, Venous    Component Value Date/Time   LATICACIDVEN 1.9 08/19/2024 0246    MICROBIOLOGY: Recent Results (from the past 240 hours)  Resp panel by RT-PCR (RSV, Flu A&B, Covid) Anterior Nasal Swab     Status: None   Collection Time: 08/18/24 11:16 PM   Specimen: Anterior Nasal Swab  Result Value Ref Range Status   SARS Coronavirus 2 by RT PCR NEGATIVE NEGATIVE Final   Influenza A by PCR NEGATIVE NEGATIVE Final   Influenza B by PCR NEGATIVE NEGATIVE Final    Comment: (NOTE) The Xpert Xpress SARS-CoV-2/FLU/RSV plus assay is intended as an aid in the diagnosis of influenza from Nasopharyngeal swab specimens and should not be used  as a sole basis for treatment. Nasal washings and aspirates are unacceptable for Xpert Xpress  SARS-CoV-2/FLU/RSV testing.  Fact Sheet for Patients: bloggercourse.com  Fact Sheet for Healthcare Providers: seriousbroker.it  This test is not yet approved or cleared by the [place]  FDA and has been authorized for detection and/or diagnosis of SARS-CoV-2 by FDA under an Emergency Use Authorization (EUA). This EUA will remain in effect (meaning this test can be used) for the duration of the COVID-19 declaration under Section 564(b)(1) of the Act, 21 U.S.C. section 360bbb-3(b)(1), unless the authorization is terminated or revoked.     Resp Syncytial Virus by PCR NEGATIVE NEGATIVE Final    Comment: (NOTE) Fact Sheet for Patients: bloggercourse.com  Fact Sheet for Healthcare Providers: seriousbroker.it  This test is not yet approved or cleared by the [place]  FDA and has been authorized for detection and/or diagnosis of SARS-CoV-2 by FDA under an Emergency Use Authorization (EUA). This EUA will remain in effect (meaning this test can be used) for the duration of the COVID-19 declaration under Section 564(b)(1) of the Act, 21 U.S.C. section 360bbb-3(b)(1), unless the authorization is terminated or revoked.  Performed at [facility], [address] [place], [place] [address]   Culture, blood (single)     Status: None (Preliminary result)   Collection Time: 08/18/24 11:26 PM   Specimen: BLOOD RIGHT ARM  Result Value Ref Range Status   Specimen Description BLOOD RIGHT ARM  Final   Special Requests   Final    BOTTLES DRAWN AEROBIC ONLY Blood Culture adequate volume   Culture   Final    NO GROWTH 1 DAY Performed at [facility], [address], [place], [place] [address]    Report Status PENDING  Incomplete    RADIOLOGY STUDIES/RESULTS: DG CHEST PORT 1 VIEW Result Date: 08/19/2024 CLINICAL DATA:  Altered mental status EXAM: PORTABLE CHEST 1 VIEW  COMPARISON:  Chest x-ray 08/19/2024, 08/02/2023 FINDINGS: No focal opacity, pleural effusion or pneumothorax. Stable cardiomediastinal silhouette with aortic atherosclerosis IMPRESSION: No active disease. Electronically Signed   By: Luke Bun M.D.   On: 08/19/2024 19:10   MR BRAIN WO CONTRAST Result Date: 08/19/2024 EXAM: MRI BRAIN WITHOUT CONTRAST 08/19/2024 01:26:03 PM TECHNIQUE: Multiplanar multisequence MRI of the head/brain was performed without the administration of intravenous contrast. COMPARISON: CT head 08/18/2024. CLINICAL HISTORY: Mental status change, unknown cause. FINDINGS: BRAIN AND VENTRICLES: No acute infarct. No intracranial hemorrhage. No mass. No midline shift. No hydrocephalus. T2 and FLAIR hyperintensity in the periventricular and subcortical white matter compatible with mild chronic microvascular ischemic changes. Mild generalized volume loss. The sella is unremarkable. Normal flow voids. ORBITS: Right lens replacement. SINUSES AND MASTOIDS: Mucosal thickening throughout the left maxillary sinus resulting in complete opacification. Additional moderate mucosal thickening in the left ethmoid sinus. There is soft tissue extending through the left maxillary sinus ostium which could reflect an antrochoanal polyp. Recommend correlation with direct visualization. Small right mastoid effusion. BONES AND SOFT TISSUES: Normal marrow signal. No significant soft tissue abnormality. IMPRESSION: 1. No acute findings. 2. Mild chronic microvascular ischemic changes and mild generalized volume loss. 3. Complete left maxillary sinus opacification with soft tissue extending through the left maxillary sinus ostium, which could reflect an antrochoanal polyp. Recommend direct visualization. Electronically signed by: Donnice Mania MD 08/19/2024 02:05 PM EST RP Workstation: [id number]   DG Chest Port 1 View Result Date: 08/19/2024 EXAM: 1 VIEW(S) XRAY OF THE CHEST 08/19/2024 12:32:00 AM COMPARISON: None  available. CLINICAL HISTORY:  ALOC FINDINGS: LUNGS AND PLEURA: Mild peribronchial thickening and interstitial prominence. No focal pulmonary opacity. No pleural effusion. No pneumothorax. HEART AND MEDIASTINUM: Aortic atherosclerosis. No acute abnormality of the cardiac and mediastinal silhouettes. BONES AND SOFT TISSUES: No acute osseous abnormality. IMPRESSION: 1. Mild peribronchial thickening and interstitial prominence could reflect bronchitis or atypypical infection. . Electronically signed by: Franky Crease MD 08/19/2024 12:34 AM EST RP Workstation: [id number]   CT HEAD WO CONTRAST Result Date: 08/18/2024 EXAM: CT HEAD WITHOUT CONTRAST 08/18/2024 09:56:07 PM TECHNIQUE: CT of the head was performed without the administration of intravenous contrast. Automated exposure control, iterative reconstruction, and/or weight based adjustment of the mA/kV was utilized to reduce the radiation dose to as low as reasonably achievable. COMPARISON: Comparison is made with head CT from 10/09/2021. CLINICAL HISTORY: Neuro deficit, acute, stroke suspected. FINDINGS: BRAIN AND VENTRICLES: No acute hemorrhage. No evidence of acute infarct. There is a small chronic lacunar infarct in the right parietal deep white matter. No old territorial infarct is seen. There is moderately developed cerebral atrophy, with small vessel disease and atrophic ventriculomegaly, relatively mild cerebellar volume loss. There are calcified plaques in the distal left vertebral artery and both siphons. No hyperdense vessel is seen. No extra-axial collection. No mass effect or midline shift. ORBITS: Old right-sided lens replacement with otherwise negative orbits. SINUSES: There is a new polypoid mass which appears to extend from the left maxillary sinus cavity into the mid to lower left nasal passage and widens the left maxillary antrum. There is near complete opacification of the left maxillary sinus with fluid and mucosal disease. There is some  demineralization noted in the left maxillary sinus walls, but no frank bone destruction. There is partial opacification of the left ethmoid air cells. Remaining visualized sinuses are clear. Left mastoids are clear. There is trace fluid in the right mastoid tip. SOFT TISSUES AND SKULL: No acute soft tissue abnormality. No skull fracture. IMPRESSION: 1. No acute intracranial CT findings. 2. 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 Benign and malignant etiologies are both possible. ENT consult recommended. 3. Small chronic lacunar infarct in the right parietal deep white matter. 4. Moderate cerebral atrophy with small vessel disease and atrophic ventriculomegaly, with relatively mild cerebellar volume loss. Electronically signed by: Francis Quam MD 08/18/2024 10:21 PM EST RP Workstation: [id number]   CT Cervical Spine Wo Contrast Result Date: 08/18/2024 EXAM: CT CERVICAL SPINE WITHOUT CONTRAST 08/18/2024 09:59:26 PM TECHNIQUE: CT of the cervical spine was performed without the administration of intravenous contrast. Multiplanar reformatted images are provided for review. Automated exposure control, iterative reconstruction, and/or weight based adjustment of the mA/kV was utilized to reduce the radiation dose to as low as reasonably achievable. COMPARISON: None available. CLINICAL HISTORY: Polytrauma, blunt. FINDINGS: BONES AND ALIGNMENT: No acute fracture or traumatic malalignment. 2 mm of anterolisthesis of C2 on C3, C6 on C7, and C7 on T1. DEGENERATIVE CHANGES: Diffuse degenerative disc and facet disease. SOFT TISSUES: No prevertebral soft tissue swelling. LUNGS: Biapical scarring and emphysema noted in the lung apices.  IMPRESSION: 1. No acute cervical spine fracture or traumatic malalignment. Electronically signed by: Franky Crease MD 08/18/2024 10:18 PM EST RP Workstation: [id number]     LOS: 1 day   Donalda Applebaum, MD  [facility]    To contact the attending provider between 7A-7P or the covering provider during after hours 7P-7A, please log into the web site [email or web address] and access using universal [place] password for that web site. If you do not have the password, please call the hospital operator.  08/20/2024, 11:39 AM    "

## 2024-08-20 NOTE — Progress Notes (Signed)
 EEG complete - results pending

## 2024-08-20 NOTE — Progress Notes (Signed)
" °   08/20/24 0209  Assess: MEWS Score  Temp 97.6 F (36.4 C)  BP (!) 176/93  MAP (mmHg) 116  Pulse Rate (!) 104  ECG Heart Rate 100  Resp (!) 28  SpO2 96 %  O2 Device Room Air  Assess: MEWS Score  MEWS Temp 0  MEWS Systolic 0  MEWS Pulse 0  MEWS RR 2  MEWS LOC 0  MEWS Score 2  MEWS Score Color Yellow  Assess: if the MEWS score is Yellow or Red  Were vital signs accurate and taken at a resting state? Yes  Does the patient meet 2 or more of the SIRS criteria? No  MEWS guidelines implemented  Yes, yellow  Treat  MEWS Interventions Considered administering scheduled or prn medications/treatments as ordered  Take Vital Signs  Increase Vital Sign Frequency  Yellow: Q2hr x1, continue Q4hrs until patient remains green for 12hrs  Escalate  MEWS: Escalate Yellow: Discuss with charge nurse and consider notifying provider and/or RRT  Notify: Charge Nurse/RN  Name of Charge Nurse/RN Notified Dawn RN  Provider Notification  Provider Name/Title Dr. Charlton  Date Provider Notified 08/20/24  Time Provider Notified [phone]  Method of Notification Page (secured chat)  Notification Reason Change in status (yellowcmews notification)  Provider response No new orders  Date of Provider Response 08/20/24  Time of Provider Response 0255  Assess: SIRS CRITERIA  SIRS Temperature  0  SIRS Respirations  1  SIRS Pulse 1  SIRS WBC 0  SIRS Score Sum  2    "

## 2024-08-20 NOTE — Procedures (Addendum)
 Patient Name: Leon Estes  MRN: [id number]  Epilepsy Attending: Arlin MALVA Krebs  Referring Physician/Provider: Raenelle Donalda CHRISTELLA, MD  Date:  08/20/2024 Duration: 24.06 mins  Patient history: 89yo M with ams. EEG to evaluate for seizure  Level of alertness: Awake  AEDs during EEG study: Ativan   Technical aspects: This EEG study was done with scalp electrodes positioned according to the 10-20 International system of electrode placement. Electrical activity was reviewed with band pass filter of 1-70Hz , sensitivity of 7 uV/mm, display speed of 32mm/sec with a 60Hz  notched filter applied as appropriate. EEG data were recorded continuously and digitally stored.  Video monitoring was available and reviewed as appropriate.  Description: The posterior dominant rhythm consists of 9 Hz activity of moderate voltage (25-35 uV) seen predominantly in posterior head regions, symmetric and reactive to eye opening and eye closing. Hyperventilation and photic stimulation were not performed.     IMPRESSION: This study is within normal limits. No seizures or epileptiform discharges were seen throughout the recording.  A normal interictal EEG does not exclude the diagnosis of epilepsy.   Finlay Godbee O Scott Vanderveer

## 2024-08-20 NOTE — Progress Notes (Signed)
 Patient is severely agitated, cannot be redirected, and was kicking nurse. Plan to give a dose of Haldol .

## 2024-08-20 NOTE — Progress Notes (Signed)
 CM left HIPAA compliant VM for wife this morning. Other family member in room with patient, but patient having procedure done and CM unable to speak with them. CM will f/u tomorrow.

## 2024-08-20 NOTE — Progress Notes (Signed)
 SLP Cancellation Note  Patient Details Name: Leon Estes MRN: [id number] DOB: [birth year]   Cancelled treatment:       Reason Eval/Treat Not Completed: Fatigue/lethargy limiting ability to participate. Per RN, pt given ativan  recently due to agitation, confusion. Pt currently NPO for swallow evaluation. Will continue efforts.   Dillin Lofgren B. Dory, [facility], CCC-SLP Speech Language Pathologist Office: [phone]  [name] 08/20/2024, 1:15 PM

## 2024-08-20 NOTE — Progress Notes (Signed)
 PT Cancellation Note  Patient Details Name: Leon Estes MRN: [id number] DOB: 1931-02-26   Cancelled Treatment:    Reason Eval/Treat Not Completed: Patient at procedure or test/unavailable  Patient having EEG placed.    Macario RAMAN, PT Acute Rehabilitation Services  Office [phone]  Macario SHAUNNA Soja 08/20/2024, 3:10 PM

## 2024-08-20 NOTE — Plan of Care (Signed)
   Problem: Safety: Goal: Ability to remain free from injury will improve Outcome: Progressing   Problem: Skin Integrity: Goal: Risk for impaired skin integrity will decrease Outcome: Progressing

## 2024-08-20 NOTE — Plan of Care (Signed)

## 2024-08-21 ENCOUNTER — Inpatient Hospital Stay (HOSPITAL_COMMUNITY)

## 2024-08-21 ENCOUNTER — Other Ambulatory Visit (HOSPITAL_COMMUNITY): Payer: Self-pay

## 2024-08-21 ENCOUNTER — Encounter

## 2024-08-21 DIAGNOSIS — I480 Paroxysmal atrial fibrillation: Secondary | ICD-10-CM | POA: Diagnosis not present

## 2024-08-21 DIAGNOSIS — J449 Chronic obstructive pulmonary disease, unspecified: Secondary | ICD-10-CM | POA: Diagnosis not present

## 2024-08-21 DIAGNOSIS — G9340 Encephalopathy, unspecified: Secondary | ICD-10-CM | POA: Diagnosis not present

## 2024-08-21 DIAGNOSIS — I10 Essential (primary) hypertension: Secondary | ICD-10-CM | POA: Diagnosis not present

## 2024-08-21 LAB — BASIC METABOLIC PANEL WITH GFR
Anion gap: 22 — ABNORMAL HIGH (ref 5–15)
BUN: 15 mg/dL (ref 8–23)
CO2: 17 mmol/L — ABNORMAL LOW (ref 22–32)
Calcium: 8.8 mg/dL — ABNORMAL LOW (ref 8.9–10.3)
Chloride: 100 mmol/L (ref 98–111)
Creatinine, Ser: 0.91 mg/dL (ref 0.61–1.24)
GFR, Estimated: >60 mL/min
Glucose, Bld: 104 mg/dL — ABNORMAL HIGH (ref 70–99)
Potassium: 3.2 mmol/L — ABNORMAL LOW (ref 3.5–5.1)
Sodium: 140 mmol/L (ref 135–145)

## 2024-08-21 LAB — BLOOD GAS, ARTERIAL
Acid-base deficit: 7.4 mmol/L — ABNORMAL HIGH (ref 0.0–2.0)
Bicarbonate: 17.3 mmol/L — ABNORMAL LOW (ref 20.0–28.0)
O2 Saturation: 98.9 %
Patient temperature: 37
pCO2 arterial: 32 mmHg (ref 32–48)
pH, Arterial: 7.34 — ABNORMAL LOW (ref 7.35–7.45)
pO2, Arterial: 96 mmHg (ref 83–108)

## 2024-08-21 LAB — CBC
HCT: 35.2 % — ABNORMAL LOW (ref 39.0–52.0)
Hemoglobin: 11.3 g/dL — ABNORMAL LOW (ref 13.0–17.0)
MCH: 29.5 pg (ref 26.0–34.0)
MCHC: 32.1 g/dL (ref 30.0–36.0)
MCV: 91.9 fL (ref 80.0–100.0)
Platelets: 240 K/uL (ref 150–400)
RBC: 3.83 MIL/uL — ABNORMAL LOW (ref 4.22–5.81)
RDW: 15 % (ref 11.5–15.5)
WBC: 10.9 K/uL — ABNORMAL HIGH (ref 4.0–10.5)
nRBC: 0 % (ref 0.0–0.2)

## 2024-08-21 MED ORDER — POTASSIUM CHLORIDE 10 MEQ/100ML IV SOLN
10.0000 meq | INTRAVENOUS | Status: AC
  Administered 2024-08-21 (×3): 10 meq via INTRAVENOUS
  Filled 2024-08-21 (×3): qty 100

## 2024-08-21 MED ORDER — SODIUM CHLORIDE 0.9 % IV SOLN: INTRAVENOUS | Status: DC

## 2024-08-21 MED ORDER — HALOPERIDOL LACTATE 5 MG/ML IJ SOLN
4.0000 mg | Freq: Four times a day (QID) | INTRAMUSCULAR | Status: DC | PRN
  Administered 2024-08-21: 4 mg via INTRAVENOUS
  Filled 2024-08-21: qty 1

## 2024-08-21 MED ORDER — LORAZEPAM 2 MG/ML IJ SOLN
INTRAMUSCULAR | Status: AC
  Filled 2024-08-21: qty 1

## 2024-08-21 MED ORDER — QUETIAPINE FUMARATE 50 MG PO TABS
50.0000 mg | ORAL_TABLET | Freq: Every day | ORAL | Status: DC
  Filled 2024-08-21: qty 1

## 2024-08-21 MED ORDER — FUROSEMIDE 10 MG/ML IJ SOLN
40.0000 mg | Freq: Once | INTRAMUSCULAR | Status: AC
  Administered 2024-08-21: 40 mg via INTRAVENOUS
  Filled 2024-08-21: qty 4

## 2024-08-21 MED ORDER — OLANZAPINE 5 MG PO TBDP
5.0000 mg | ORAL_TABLET | Freq: Once | ORAL | Status: AC
  Administered 2024-08-21: 5 mg via ORAL
  Filled 2024-08-21: qty 1

## 2024-08-21 MED ORDER — LORAZEPAM 2 MG/ML IJ SOLN
1.0000 mg | Freq: Once | INTRAMUSCULAR | Status: AC
  Administered 2024-08-21: 1 mg via INTRAVENOUS

## 2024-08-21 MED ORDER — HALOPERIDOL LACTATE 5 MG/ML IJ SOLN
5.0000 mg | Freq: Once | INTRAMUSCULAR | Status: AC
  Administered 2024-08-21: 5 mg via INTRAVENOUS
  Filled 2024-08-21: qty 1

## 2024-08-21 MED ORDER — METOPROLOL TARTRATE 5 MG/5ML IV SOLN
5.0000 mg | INTRAVENOUS | Status: DC | PRN
  Administered 2024-08-21: 5 mg via INTRAVENOUS
  Filled 2024-08-21: qty 5

## 2024-08-21 NOTE — Progress Notes (Addendum)
 Patient was seen for labored respirations.   He is sleeping, wakes to voice. No pallor or diaphoresis. He is tachypneic with labored respirations and saturation in low 90s on 2 Lpm via nasal canula. No wheezing or rhonchi. HR is 80s and irregularly irregular. Neck veins are distended. Abdomen soft.   Plan to check CXR and ABG, give a dose of Lasix .

## 2024-08-21 NOTE — TOC Progression Note (Signed)
 Transition of Care [facility]) - Progression Note    Patient Details  Name: Leon Estes MRN: [id number] Date of Birth: 06-18-1931  [facility] of [facility] CM/SW Contact  Landry DELENA Senters, RN Phone Number: 08/21/2024, 1:06 PM  Clinical Narrative:     CM called wife and left VM for callback. No family available in room.   Continued medical workup.  CM will continue to follow.                     Expected Discharge Plan and Services                                               Social Drivers of Health (SDOH) Interventions SDOH Screenings   Food Insecurity: No Food Insecurity (08/19/2024)  Housing: Low Risk (08/19/2024)  Transportation Needs: No Transportation Needs (08/19/2024)  Utilities: Not At Risk (08/19/2024)  Depression (PHQ2-9): Low Risk (04/09/2024)  Financial Resource Strain: Medium Risk (04/03/2024)   Received from [facility]  Social Connections: Moderately Isolated (08/19/2024)  Tobacco Use: High Risk (08/19/2024)    Readmission Risk Interventions     No data to display

## 2024-08-21 NOTE — Progress Notes (Signed)
 OT Cancellation Note  Patient Details Name: Leon Estes MRN: [id number] DOB: 03-Apr-1931   Cancelled Treatment:    Reason Eval/Treat Not Completed: Medical issues which prohibited therapy.  Remains agitated.  Continue efforts as appropriate.    Shelanda Duvall D Trisa Cranor 08/21/2024, 11:25 AM 08/21/2024  RP, OTR/L  Acute Rehabilitation Services  Office:  [phone]

## 2024-08-21 NOTE — Progress Notes (Signed)
 Reached out to provider twice overnight regarding pt agitation and combativeness. pt restless, yelling incomprehensible words, kicking, swinging arms, punching air even when no one was there (hence 2 new skin tears-see LDA) and he became hypertensive (SBP 180s/190s) tachypneic (resp 30-40), tachycardic (highest was 130s-mostly low 100s).  Provider gave Med orders, see MAR x2, we placed mitts, safety sitter ordered but only had monitor sitter available.  Gave pt PRN breathing treatment, continuously repositioned in attempt to decrease work of breathing, and prevent fall, used diversional activities, requested pt hold my hand, ask pt about his wife, decreased lights and noise, but pt did not respond to interventions.

## 2024-08-21 NOTE — Progress Notes (Signed)
 "                        PROGRESS NOTE        PATIENT DETAILS Name: Leon Estes Age: 89 y.o. Sex: male Date of Birth: 1931/06/06 Admit Date: 08/18/2024 Admitting Physician Evalene GORMAN Sprinkles, MD ERE:Antj, Reuben POUR, MD  Brief Summary: Patient is a 89 y.o.  male with history of HTN, HLD, PAF not anticoagulated, CKD stage IIIa, COPD, dementia/cognitive issues-who presented with altered mental status.  Per family-dog died recently and has had significant decrease in oral intake since then.  Significant events: 1/7>> admit to [facility]  Significant studies: 1/6>> CT head: No acute intracranial normality 1/6>> CT spine: No fracture 1/6>> CXR: Mild peribronchial thickening/interstitial prominence-possible atypical infection/bronchitis. 1/7>> MRI brain: No acute findings 1/7>> TSH: Normal limits 1/7>> vitamin B12: 650 1/7>> RPR: Nonreactive 1/7>> ammonia: Normal 1/8>> EEG: No seizures.  Significant microbiology data: 1/6>> COVID/influenza/RSV PCR: Negative 1/6>> blood culture: No growth  Procedures: None  Consults: None  Subjective: Had agitation issues again last night-did not get any oral Zyprexa .  Seen earlier this morning and he was sedated-but this afternoon he is much more awake-answer simple questions appropriately.  Does not have dentures-so it is hard to discern what he is saying.  He is incredibly hard of hearing and also has some vision issues.  Objective: Vitals: Blood pressure (!) 139/51, pulse (!) 110, temperature 98.1 F (36.7 C), temperature source Axillary, resp. rate 20, height 5' 8 (1.727 m), weight 68.8 kg, SpO2 96%.   Exam: Gen Exam:Alert awake-not in any distress HEENT:atraumatic, normocephalic Chest: B/L clear to auscultation anteriorly CVS:S1S2 regular Abdomen:soft non tender, non distended Extremities:no edema Neurology: Non focal Skin: no rash  Pertinent Labs/Radiology:    Latest Ref Rng & Units 08/21/2024    3:49 AM 08/20/2024    4:32 AM 08/19/2024     5:02 AM  CBC  WBC 4.0 - 10.5 K/uL 10.9  13.6  12.6   Hemoglobin 13.0 - 17.0 g/dL 88.6  87.9  88.7   Hematocrit 39.0 - 52.0 % 35.2  37.2  35.5   Platelets 150 - 400 K/uL 240  235  216     Lab Results  Component Value Date   NA 140 08/21/2024   K 3.2 (L) 08/21/2024   CL 100 08/21/2024   CO2 17 (L) 08/21/2024      Assessment/Plan: Acute metabolic encephalopathy Unclear etiology-suspected dehydration-apparently dog died recently and patient has not eaten or drank since then.  Reviewed outpatient notes-he does have some cognitive issues-and has vision and hearing loss as well. Extensive workup negative so far Neck is supple and exam-doubt meningitis Continue supportive care-not able to take any oral medications-Will try to see if he can take Zyprexa  ODT at night.  AKI  Hemodynamically mediated Resolved Follow electrolytes   Hypokalemia  Replete/recheck   COPD  Not in exacerbation Bronchodilators Stage scheduled bronchodilators   Chronic HFmrEF (EF 45% September 2025) Hold Lasix /losartan -unable to take any oral meds due to AMS Continue to cautiously hydrate as he has no significant oral intake due to AMS.  PAF  Follows with [facility], not anticoagulated due to falls  Continue Coreg      Dementia  Use delirium precautions   See above   Complete opacification of left maxillary sinus with soft tissue extending through the ostium-likely reflecting a polyp ED MD discussed with ENT (Dr. Earlie follow-up is recommended  Code status:   Code  Status: Limited: Do not attempt resuscitation (DNR) -DNR-LIMITED -Do Not Intubate/DNI    DVT Prophylaxis: enoxaparin  (LOVENOX ) injection 40 mg Start: 08/19/24 1400   Family Communication: Spouse/daughter at bedside on 1/8.   Disposition Plan: Status is: Inpatient Remains inpatient appropriate because: Severity of illness   Planned Discharge Destination:Home health   Diet: Diet Order             Diet NPO  time specified  Diet effective now                     Antimicrobial agents: Anti-infectives (From admission, onward)    Start     Dose/Rate Route Frequency Ordered Stop   08/20/24 0500  vancomycin  (VANCOREADY) IVPB 750 mg/150 mL  Status:  Discontinued        750 mg 150 mL/hr over 60 Minutes Intravenous Every 24 hours 08/19/24 0922 08/19/24 1856   08/19/24 2000  doxycycline  (VIBRAMYCIN ) 100 mg in sodium chloride  0.9 % 250 mL IVPB  Status:  Discontinued        100 mg 125 mL/hr over 120 Minutes Intravenous Every 12 hours 08/19/24 1910 08/20/24 1155   08/19/24 1945  azithromycin  (ZITHROMAX ) 500 mg in sodium chloride  0.9 % 250 mL IVPB  Status:  Discontinued        500 mg 250 mL/hr over 60 Minutes Intravenous Every 24 hours 08/19/24 1856 08/19/24 1910   08/19/24 1200  ceFEPIme  (MAXIPIME ) 2 g in sodium chloride  0.9 % 100 mL IVPB  Status:  Discontinued        2 g 200 mL/hr over 30 Minutes Intravenous Every 12 hours 08/19/24 0408 08/19/24 1856   08/19/24 1000  metroNIDAZOLE  (FLAGYL ) IVPB 500 mg  Status:  Discontinued        500 mg 100 mL/hr over 60 Minutes Intravenous Every 12 hours 08/19/24 0359 08/19/24 1856   08/19/24 0408  vancomycin  variable dose per unstable renal function (pharmacist dosing)  Status:  Discontinued         Does not apply See admin instructions 08/19/24 0408 08/19/24 0922   08/18/24 2345  vancomycin  (VANCOREADY) IVPB 1500 mg/300 mL        1,500 mg 150 mL/hr over 120 Minutes Intravenous  Once 08/18/24 2331 08/19/24 0451   08/18/24 2345  ceFEPIme  (MAXIPIME ) 2 g in sodium chloride  0.9 % 100 mL IVPB        2 g 200 mL/hr over 30 Minutes Intravenous  Once 08/18/24 2332 08/19/24 0127   08/18/24 2330  aztreonam  (AZACTAM ) 2 g in sodium chloride  0.9 % 100 mL IVPB  Status:  Discontinued        2 g 200 mL/hr over 30 Minutes Intravenous  Once 08/18/24 2326 08/18/24 2331   08/18/24 2330  metroNIDAZOLE  (FLAGYL ) IVPB 500 mg        500 mg 100 mL/hr over 60 Minutes Intravenous   Once 08/18/24 2326 08/19/24 0230   08/18/24 2330  vancomycin  (VANCOCIN ) IVPB 1000 mg/200 mL premix  Status:  Discontinued        1,000 mg 200 mL/hr over 60 Minutes Intravenous  Once 08/18/24 2326 08/18/24 2331        MEDICATIONS: Scheduled Meds:  budesonide  (PULMICORT ) nebulizer solution  0.25 mg Nebulization BID   carvedilol   3.125 mg Oral BID WC   enoxaparin  (LOVENOX ) injection  40 mg Subcutaneous Q24H   OLANZapine  zydis  5 mg Oral QHS   OLANZapine  zydis  5 mg Oral Once   pantoprazole  (PROTONIX ) IV  40 mg Intravenous Q24H   revefenacin   175 mcg Nebulization Daily   simvastatin   40 mg Oral QHS   sodium chloride  flush  10-40 mL Intracatheter Q12H   sodium chloride  flush  3 mL Intravenous Q12H   tamsulosin   0.4 mg Oral Daily   Continuous Infusions:  sodium chloride      potassium chloride  10 mEq (08/21/24 1246)   PRN Meds:.acetaminophen  **OR** acetaminophen , haloperidol  lactate, hydrALAZINE , ipratropium-albuterol , prochlorperazine , senna-docusate, sodium chloride  flush   I have personally reviewed following labs and imaging studies  LABORATORY DATA: CBC: Recent Labs  Lab 08/18/24 2016 08/18/24 2031 08/19/24 0502 08/20/24 0432 08/21/24 0349  WBC  --  15.8* 12.6* 13.6* 10.9*  NEUTROABS  --  10.8*  --   --   --   HGB 17.3* 15.2 11.2* 12.0* 11.3*  HCT 51.0 48.0 35.5* 37.2* 35.2*  MCV  --  92.7 93.2 90.7 91.9  PLT  --  278 216 235 240    Basic Metabolic Panel: Recent Labs  Lab 08/18/24 2016 08/18/24 2031 08/19/24 0502 08/20/24 0432 08/21/24 0349  NA 138 138 137 139 140  K 3.2* 3.3* 3.3* 3.2* 3.2*  CL 96* 95* 101 99 100  CO2  --  29 27 23  17*  GLUCOSE 119* 113* 91 76 104*  BUN 12 11 14 11 15   CREATININE 1.50* 1.41* 1.16 0.97 0.91  CALCIUM   --  10.1 8.5* 8.8* 8.8*  MG  --   --  1.8  --   --     GFR: Estimated Creatinine Clearance: 51.2 mL/min (by C-G formula based on SCr of 0.91 mg/dL).  Liver Function Tests: Recent Labs  Lab 08/18/24 2031  AST 38   ALT 19  ALKPHOS 110  BILITOT 0.8  PROT 8.6*  ALBUMIN  4.5   No results for input(s): LIPASE, AMYLASE in the last 168 hours. Recent Labs  Lab 08/19/24 0358  AMMONIA 26    Coagulation Profile: Recent Labs  Lab 08/18/24 2031  INR 1.0    Cardiac Enzymes: No results for input(s): CKTOTAL, CKMB, CKMBINDEX, TROPONINI in the last 168 hours.  BNP (last 3 results) No results for input(s): PROBNP in the last 8760 hours.  Lipid Profile: No results for input(s): CHOL, HDL, LDLCALC, TRIG, CHOLHDL, LDLDIRECT in the last 72 hours.  Thyroid  Function Tests: Recent Labs    08/20/24 0432  TSH 1.700    Anemia Panel: Recent Labs    08/19/24 0502  VITAMINB12 650    Urine analysis:    Component Value Date/Time   COLORURINE AMBER (A) 08/19/2024 0320   APPEARANCEUR HAZY (A) 08/19/2024 0320   APPEARANCEUR Clear 04/29/2013 1543   LABSPEC 1.032 (H) 08/19/2024 0320   LABSPEC 1.020 04/29/2013 1543   PHURINE 5.0 08/19/2024 0320   GLUCOSEU NEGATIVE 08/19/2024 0320   GLUCOSEU Negative 04/29/2013 1543   HGBUR MODERATE (A) 08/19/2024 0320   BILIRUBINUR SMALL (A) 08/19/2024 0320   BILIRUBINUR Negative 03/14/2022 1506   BILIRUBINUR Negative 04/29/2013 1543   KETONESUR 5 (A) 08/19/2024 0320   PROTEINUR >=300 (A) 08/19/2024 0320   UROBILINOGEN 0.2 03/14/2022 1506   UROBILINOGEN 0.2 03/08/2012 1624   NITRITE NEGATIVE 08/19/2024 0320   LEUKOCYTESUR NEGATIVE 08/19/2024 0320   LEUKOCYTESUR Negative 04/29/2013 1543    Sepsis Labs: Lactic Acid, Venous    Component Value Date/Time   LATICACIDVEN 1.9 08/19/2024 0246    MICROBIOLOGY: Recent Results (from the past 240 hours)  Resp panel by RT-PCR (RSV, Flu A&B, Covid) Anterior Nasal Swab  Status: None   Collection Time: 08/18/24 11:16 PM   Specimen: Anterior Nasal Swab  Result Value Ref Range Status   SARS Coronavirus 2 by RT PCR NEGATIVE NEGATIVE Final   Influenza A by PCR NEGATIVE NEGATIVE Final    Influenza B by PCR NEGATIVE NEGATIVE Final    Comment: (NOTE) The Xpert Xpress SARS-CoV-2/FLU/RSV plus assay is intended as an aid in the diagnosis of influenza from Nasopharyngeal swab specimens and should not be used as a sole basis for treatment. Nasal washings and aspirates are unacceptable for Xpert Xpress SARS-CoV-2/FLU/RSV testing.  Fact Sheet for Patients: bloggercourse.com  Fact Sheet for Healthcare Providers: seriousbroker.it  This test is not yet approved or cleared by the [place]  FDA and has been authorized for detection and/or diagnosis of SARS-CoV-2 by FDA under an Emergency Use Authorization (EUA). This EUA will remain in effect (meaning this test can be used) for the duration of the COVID-19 declaration under Section 564(b)(1) of the Act, 21 U.S.C. section 360bbb-3(b)(1), unless the authorization is terminated or revoked.     Resp Syncytial Virus by PCR NEGATIVE NEGATIVE Final    Comment: (NOTE) Fact Sheet for Patients: bloggercourse.com  Fact Sheet for Healthcare Providers: seriousbroker.it  This test is not yet approved or cleared by the [place]  FDA and has been authorized for detection and/or diagnosis of SARS-CoV-2 by FDA under an Emergency Use Authorization (EUA). This EUA will remain in effect (meaning this test can be used) for the duration of the COVID-19 declaration under Section 564(b)(1) of the Act, 21 U.S.C. section 360bbb-3(b)(1), unless the authorization is terminated or revoked.  Performed at [facility], [address], [place], [place] [address]   Culture, blood (single)     Status: None (Preliminary result)   Collection Time: 08/18/24 11:26 PM   Specimen: BLOOD RIGHT ARM  Result Value Ref Range Status   Specimen Description BLOOD RIGHT ARM  Final   Special Requests   Final    BOTTLES DRAWN AEROBIC ONLY Blood Culture  adequate volume   Culture   Final    NO GROWTH 2 DAYS Performed at [facility], [address], [place], [place] [address]    Report Status PENDING  Incomplete    RADIOLOGY STUDIES/RESULTS: EEG adult Result Date: 08/20/2024 Shelton Arlin KIDD, MD     08/20/2024  5:27 PM Patient Name: MAYRA BRAHM MRN: [id number] Epilepsy Attending: Arlin KIDD Shelton Referring Physician/Provider: Raenelle Donalda CHRISTELLA, MD Date:  08/20/2024 Duration: 24.06 mins Patient history: 89yo M with ams. EEG to evaluate for seizure Level of alertness: Awake AEDs during EEG study: Ativan  Technical aspects: This EEG study was done with scalp electrodes positioned according to the 10-20 International system of electrode placement. Electrical activity was reviewed with band pass filter of 1-70Hz , sensitivity of 7 uV/mm, display speed of 81mm/sec with a 60Hz  notched filter applied as appropriate. EEG data were recorded continuously and digitally stored.  Video monitoring was available and reviewed as appropriate. Description: The posterior dominant rhythm consists of 9 Hz activity of moderate voltage (25-35 uV) seen predominantly in posterior head regions, symmetric and reactive to eye opening and eye closing. Hyperventilation and photic stimulation were not performed.   IMPRESSION: This study is within normal limits. No seizures or epileptiform discharges were seen throughout the recording. A normal interictal EEG does not exclude the diagnosis of epilepsy. Arlin KIDD Shelton   DG CHEST PORT 1 VIEW Result Date: 08/19/2024 CLINICAL DATA:  Altered mental status EXAM:  PORTABLE CHEST 1 VIEW COMPARISON:  Chest x-ray 08/19/2024, 08/02/2023 FINDINGS: No focal opacity, pleural effusion or pneumothorax. Stable cardiomediastinal silhouette with aortic atherosclerosis IMPRESSION: No active disease. Electronically Signed   By: Luke Bun M.D.   On: 08/19/2024 19:10   MR BRAIN WO CONTRAST Result Date: 08/19/2024 EXAM: MRI BRAIN WITHOUT CONTRAST  08/19/2024 01:26:03 PM TECHNIQUE: Multiplanar multisequence MRI of the head/brain was performed without the administration of intravenous contrast. COMPARISON: CT head 08/18/2024. CLINICAL HISTORY: Mental status change, unknown cause. FINDINGS: BRAIN AND VENTRICLES: No acute infarct. No intracranial hemorrhage. No mass. No midline shift. No hydrocephalus. T2 and FLAIR hyperintensity in the periventricular and subcortical white matter compatible with mild chronic microvascular ischemic changes. Mild generalized volume loss. The sella is unremarkable. Normal flow voids. ORBITS: Right lens replacement. SINUSES AND MASTOIDS: Mucosal thickening throughout the left maxillary sinus resulting in complete opacification. Additional moderate mucosal thickening in the left ethmoid sinus. There is soft tissue extending through the left maxillary sinus ostium which could reflect an antrochoanal polyp. Recommend correlation with direct visualization. Small right mastoid effusion. BONES AND SOFT TISSUES: Normal marrow signal. No significant soft tissue abnormality. IMPRESSION: 1. No acute findings. 2. Mild chronic microvascular ischemic changes and mild generalized volume loss. 3. Complete left maxillary sinus opacification with soft tissue extending through the left maxillary sinus ostium, which could reflect an antrochoanal polyp. Recommend direct visualization. Electronically signed by: Donnice Mania MD 08/19/2024 02:05 PM EST RP Workstation: [id number]     LOS: 2 days   Donalda Applebaum, MD  [facility]    To contact the attending provider between 7A-7P or the covering provider during after hours 7P-7A, please log into the web site [email or web address] and access using universal [place] password for that web site. If you do not have the password, please call the hospital operator.  08/21/2024, 1:14 PM    "

## 2024-08-21 NOTE — Progress Notes (Signed)
" °   08/20/24 2200  Assess: MEWS Score  BP (!) 184/122  MAP (mmHg) 137  Pulse Rate (!) 105  ECG Heart Rate (!) 101  Resp (!) 34  SpO2 93 %  Assess: MEWS Score  MEWS Temp 0  MEWS Systolic 0  MEWS Pulse 1  MEWS RR 2  MEWS LOC 0  MEWS Score 3  MEWS Score Color Yellow  Assess: if the MEWS score is Yellow or Red  Were vital signs accurate and taken at a resting state? Yes  Does the patient meet 2 or more of the SIRS criteria? Yes  Does the patient have a confirmed or suspected source of infection? No  MEWS guidelines implemented  Yes, yellow  Treat  MEWS Interventions Considered administering scheduled or prn medications/treatments as ordered  Take Vital Signs  Increase Vital Sign Frequency  Yellow: Q2hr x1, continue Q4hrs until patient remains green for 12hrs  Escalate  MEWS: Escalate Yellow: Discuss with charge nurse and consider notifying provider and/or RRT  Notify: Charge Nurse/RN  Name of Charge Nurse/RN Notified Elspeth CROME  Provider Notification  Provider Name/Title Evalene Sprinkles, MD  Date Provider Notified 08/20/24  Time Provider Notified 2230  Method of Notification Page  Notification Reason Change in status  Provider response See new orders  Date of Provider Response 08/20/24  Time of Provider Response 2240  Assess: SIRS CRITERIA  SIRS Temperature  0  SIRS Respirations  1  SIRS Pulse 1  SIRS WBC 0  SIRS Score Sum  2    "

## 2024-08-21 NOTE — Plan of Care (Signed)
" °  Problem: Fluid Volume: Goal: Hemodynamic stability will improve Outcome: Progressing   Problem: Respiratory: Goal: Ability to maintain adequate ventilation will improve Outcome: Progressing   Problem: Skin Integrity: Goal: Risk for impaired skin integrity will decrease Outcome: Progressing   "

## 2024-08-21 NOTE — Progress Notes (Signed)
 This nurse noticed a yellow MEWS alert for patient from Respiratory therapist documentation, vital signs rechecked and noted patient to be on red MEWS. Hydralazine  was given earlier for the blood pressure. Dr Charlton notified of patient's change of status because ofd sustaining Afib RVR and tachypnea. Darina RN made aware of situation. Lopressor  ordered  and administered.    08/21/24 2018 08/21/24 2025 08/21/24 2100  Assess: MEWS Score  Temp  --  98.7 F (37.1 C)  --   BP  --  (!) 154/95 (!) 161/93  MAP (mmHg)  --  113 113  Pulse Rate (!) 106 (!) 113 (!) 127  ECG Heart Rate  --   --  (!) 128  Resp (!) 33 (!) 26 (!) 33  SpO2 96 % 92 % 99 %  O2 Device Room Air Room Air  --   Assess: MEWS Score  MEWS Temp 0 0 0  MEWS Systolic 0 0 0  MEWS Pulse 1 2 2   MEWS RR 2 2 2   MEWS LOC 0 0 0  MEWS Score 3 4 4   MEWS Score Color Yellow Red Red  Assess: if the MEWS score is Yellow or Red  Were vital signs accurate and taken at a resting state?  --  Yes  --   Does the patient meet 2 or more of the SIRS criteria?  --  Yes  --   Does the patient have a confirmed or suspected source of infection?  --  No  --   MEWS guidelines implemented   --  Yes, red  --   Treat  MEWS Interventions  --  Considered administering scheduled or prn medications/treatments as ordered  --   Take Vital Signs  Increase Vital Sign Frequency   --  Red: Q1hr x2, continue Q4hrs until patient remains green for 12hrs  --   Escalate  MEWS: Escalate  --  Red: Discuss with charge nurse and notify provider. Consider notifying RRT. If remains red for 2 hours consider need for higher level of care  --   Notify: Charge Nurse/RN  Name of Charge Nurse/RN Notified  --  RAndy  --   Provider Notification  Provider Name/Title  --  Evalene Charlton  --   Date Provider Notified  --  08/21/24  --   Time Provider Notified  --  2036  --   Method of Notification  --  Page  --   Notification Reason  --  Change in status  --   Provider response  --  See  new orders  --   Date of Provider Response  --  08/21/24  --   Time of Provider Response  --  2100  --   Assess: SIRS CRITERIA  SIRS Temperature  0 0 0  SIRS Respirations  1 1 1   SIRS Pulse 1 1 1   SIRS WBC 0 0 0  SIRS Score Sum  2 2 2

## 2024-08-21 NOTE — Progress Notes (Signed)
 PT Cancellation Note  Patient Details Name: Leon Estes MRN: [id number] DOB: 06-22-1931   Cancelled Treatment:    Reason Eval/Treat Not Completed: (P) Patient not medically ready Per chart review and discussion with RN, pt has been combative with staff due to AMS. RN states pt is not appropriate to evaluate at this time, will hold until cognition and behavior has improved.   Sabra Morel, PT, DPT  Acute Rehabilitation Services         Office: [phone]      Sabra MARLA Morel 08/21/2024, 12:16 PM

## 2024-08-21 NOTE — Progress Notes (Signed)
 SLP Cancellation Note  Patient Details Name: Leon Estes MRN: [id number] DOB: 13-Jun-1931   Cancelled treatment:       Reason Eval/Treat Not Completed: Fatigue/lethargy limiting ability to participate  Per pt presentation at bedside, pt continues inappropriate for BSE at this time. Will continue efforts. RN aware.  Lazariah Savard B. Dory, MSP, CCC-SLP Speech Language Pathologist Office: [phone]  [name] 08/21/2024, 9:34 AM

## 2024-08-22 DIAGNOSIS — I480 Paroxysmal atrial fibrillation: Secondary | ICD-10-CM | POA: Diagnosis not present

## 2024-08-22 DIAGNOSIS — I10 Essential (primary) hypertension: Secondary | ICD-10-CM | POA: Diagnosis not present

## 2024-08-22 DIAGNOSIS — J449 Chronic obstructive pulmonary disease, unspecified: Secondary | ICD-10-CM | POA: Diagnosis not present

## 2024-08-22 DIAGNOSIS — G9340 Encephalopathy, unspecified: Secondary | ICD-10-CM | POA: Diagnosis not present

## 2024-08-22 LAB — BASIC METABOLIC PANEL WITH GFR
Anion gap: 20 — ABNORMAL HIGH (ref 5–15)
BUN: 18 mg/dL (ref 8–23)
CO2: 20 mmol/L — ABNORMAL LOW (ref 22–32)
Calcium: 8.8 mg/dL — ABNORMAL LOW (ref 8.9–10.3)
Chloride: 102 mmol/L (ref 98–111)
Creatinine, Ser: 1.07 mg/dL (ref 0.61–1.24)
GFR, Estimated: >60 mL/min
Glucose, Bld: 92 mg/dL (ref 70–99)
Potassium: 3.6 mmol/L (ref 3.5–5.1)
Sodium: 141 mmol/L (ref 135–145)

## 2024-08-22 MED ORDER — QUETIAPINE FUMARATE 25 MG PO TABS
25.0000 mg | ORAL_TABLET | Freq: Every day | ORAL | Status: DC
  Administered 2024-08-22 – 2024-08-23 (×2): 25 mg via ORAL
  Filled 2024-08-22 (×2): qty 1

## 2024-08-22 MED ORDER — TRAZODONE HCL 50 MG PO TABS: 50.0000 mg | ORAL_TABLET | Freq: Every evening | ORAL | Status: DC | PRN

## 2024-08-22 MED ORDER — SODIUM CHLORIDE 0.9 % IV SOLN
2.0000 g | Freq: Every day | INTRAVENOUS | Status: AC
  Administered 2024-08-22 – 2024-08-24 (×3): 2 g via INTRAVENOUS
  Filled 2024-08-22 (×3): qty 20

## 2024-08-22 MED ORDER — QUETIAPINE FUMARATE 25 MG PO TABS
25.0000 mg | ORAL_TABLET | Freq: Two times a day (BID) | ORAL | Status: DC
  Filled 2024-08-22: qty 1

## 2024-08-22 MED ORDER — MELATONIN 5 MG PO TABS
5.0000 mg | ORAL_TABLET | Freq: Every day | ORAL | Status: DC
  Administered 2024-08-22 – 2024-08-23 (×2): 5 mg via ORAL
  Filled 2024-08-22 (×2): qty 1

## 2024-08-22 MED ORDER — QUETIAPINE FUMARATE 50 MG PO TABS: 50.0000 mg | ORAL_TABLET | Freq: Every day | ORAL | Status: DC

## 2024-08-22 MED ORDER — ARFORMOTEROL TARTRATE 15 MCG/2ML IN NEBU
15.0000 ug | INHALATION_SOLUTION | Freq: Two times a day (BID) | RESPIRATORY_TRACT | Status: DC
  Administered 2024-08-22 – 2024-08-24 (×5): 15 ug via RESPIRATORY_TRACT
  Filled 2024-08-22 (×5): qty 2

## 2024-08-22 NOTE — Evaluation (Signed)
 Physical Therapy Evaluation Patient Details Name: Leon Estes MRN: [id number] DOB: Sep 13, 1930 Today's Date: 08/22/2024  History of Present Illness  89yo male admitted from home 08/19/23 with AMS after fall. MRI: no acute findings. CXR: no active disease. BSE 07/28/21: Dys3/thin, meds whole with liquid. Poor dentition. PMH: BPH, HTN, HLD, ischemic cardiomyopathy, PAF, CKD3a, CAD, COPD.   Clinical Impression  Pt is currently mobilizing below his baseline due to altered mental status. Pt stuporous throughout session, responding briefly to multiple forms of stimuli and mumble in response but quickly falls back asleep. Pt maxAx2 for bed mobility with initial minA required for trunk control EOB but progresses to CGA. Pt frequently leaning forward in attempts to stand. Pt requires modAx2 for initial STS with B knee blocking due to buckling. STSx3 completed throughout session and improves to minAx2. Pt becomes increasingly alert with upright mobility but has difficulty following cues, difficult to tell what is due to cognition vs HOH. Family present throughout and providing support. Pt unable to weight shift to attempt to step at this time. Pt would benefit from continued PT services focused on bed mobility, balance, transfers, and progress towards gait with improved cognition.   Recommendation at this time is for <3hrs rehab, however, pt with multiple family members able to provide 24/7 support. Multiple family members in the medical field. Family comfortable with taking pt home and would prefer that. Will need hospital bed if going home. Will continue to follow and update recs as needed.         If plan is discharge home, recommend the following: Two people to help with bathing/dressing/bathroom;Two people to help with walking and/or transfers;Assistance with cooking/housework;Assistance with feeding;Direct supervision/assist for medications management;Direct supervision/assist for financial  management;Assist for transportation;Help with stairs or ramp for entrance;Supervision due to cognitive status   Can travel by private vehicle   No    Equipment Recommendations Hospital bed  Recommendations for Other Services       Functional Status Assessment Patient has had a recent decline in their functional status and demonstrates the ability to make significant improvements in function in a reasonable and predictable amount of time.     Precautions / Restrictions Precautions Precautions: Fall Recall of Precautions/Restrictions: Impaired Precaution/Restrictions Comments: Pt with wrist restraints currently due to agitation and pulling at lines. Restrictions Weight Bearing Restrictions Per Provider Order: No      Mobility  Bed Mobility Overal bed mobility: Needs Assistance Bed Mobility: Supine to Sit, Sit to Supine     Supine to sit: Max assist, +2 for physical assistance Sit to supine: Max assist, +2 for physical assistance   General bed mobility comments: Trunk and LE assist required. Increased wakefulness noted once seated EOB. MinA/CGA for trunk control EOB.    Transfers Overall transfer level: Needs assistance Equipment used: 2 person hand held assist Transfers: Sit to/from Stand Sit to Stand: Mod assist, +2 physical assistance           General transfer comment: STS x3 completed. Pt requires B knee blocking initially with assist to initiate stance. Improved to minAx2 for consecutive attempts without need to block knees. Remains standing ~2 minutes during last attempt. Attempts to push PT away during last stand, not in aggressive manner.    Ambulation/Gait               General Gait Details: Unable to initiate weight shift to take steps at this time. Not safe to attempt due to decreased wakefulness.  Stairs  Wheelchair Mobility     Tilt Bed    Modified Rankin (Stroke Patients Only)       Balance Overall balance assessment:  Needs assistance Sitting-balance support: Bilateral upper extremity supported Sitting balance-Leahy Scale: Fair Sitting balance - Comments: MinA/CGA seated balance EOB. Pt frequently leaning forward in attempts to stand.   Standing balance support: Bilateral upper extremity supported Standing balance-Leahy Scale: Fair Standing balance comment: Pt initially with poor standing balance, requiring knee blocking and hip overpressure to remain standing. Progresses to minAx2 with hand held assist for balance. Knee block no longer required.                             Pertinent Vitals/Pain Pain Assessment Pain Assessment: No/denies pain    Home Living Family/patient expects to be discharged to:: Private residence Living Arrangements: Spouse/significant other Available Help at Discharge: Family;Available 24 hours/day (Wife available 24/7. Adult children, adult grandchildren, and adult and minor great-grandchildren live near by and can all assist with at least one daughter, who is a retired LAWYER, it trainer.) Type of Home: House Home Access: Stairs to enter;Ramped entrance Entrance Stairs-Rails: Right Entrance Stairs-Number of Steps: 2-3 (at backdoor; also have deck with ramp)   Home Layout: One level Home Equipment: Agricultural Consultant (2 wheels);BSC/3in1;Rollator (4 wheels);Wheelchair - manual;Grab bars - tub/shower (hurrycane; handheld urinal) Additional Comments: Multiple family members live 5-10 minutes away. Family present to provide home set up during session.    Prior Function Prior Level of Function : Independent/Modified Independent;Needs assist             Mobility Comments: Ind, likes to drive golf cart around. ADLs Comments: Ind to Supervision, wife will set up showering or dressing for patient when needed. Wife assists with medication management.     Extremity/Trunk Assessment   Upper Extremity Assessment Upper Extremity Assessment: Right hand dominant;Difficult  to assess due to impaired cognition (Pt currently not able to answer most questions or follow cues related to sensation or MMT. ROM appears largely WNL with mildly decreased B shoulder AROM. Pt with spontaneous movement of B UE and grasping/releasing ojects.)    Lower Extremity Assessment Lower Extremity Assessment: Defer to PT evaluation    Cervical / Trunk Assessment Cervical / Trunk Assessment: Kyphotic  Communication   Communication Communication: Impaired Factors Affecting Communication: Hearing impaired;Difficulty expressing self;Reduced clarity of speech    Cognition Arousal: Suspect due to medications, Stuporous Behavior During Therapy: Flat affect   PT - Cognitive impairments: Orientation, Awareness, Safety/Judgement, Problem solving, Sequencing, Initiation, Attention   Orientation impairments: Person                     Following commands: Impaired Following commands impaired: Follows one step commands inconsistently     Cueing Cueing Techniques: Verbal cues, Tactile cues, Visual cues     General Comments General comments (skin integrity, edema, etc.): VSS on RA throughout session. Multiple family members present and very supportive throughout session    Exercises     Assessment/Plan    PT Assessment Patient needs continued PT services  PT Problem List Decreased strength;Decreased mobility;Decreased safety awareness;Decreased range of motion;Decreased coordination;Decreased knowledge of precautions;Decreased activity tolerance;Decreased cognition;Decreased balance;Decreased knowledge of use of DME;Decreased skin integrity       PT Treatment Interventions DME instruction;Therapeutic exercise;Gait training;Balance training;Stair training;Neuromuscular re-education;Functional mobility training;Cognitive remediation;Therapeutic activities;Patient/family education    PT Goals (Current goals can be found in the Care Plan section)  Acute Rehab PT Goals Patient  Stated Goal: Take Pt home PT Goal Formulation: With family Time For Goal Achievement: 09/05/24 Potential to Achieve Goals: Good    Frequency Min 2X/week     Co-evaluation   Reason for Co-Treatment: Complexity of the patient's impairments (multi-system involvement);Necessary to address cognition/behavior during functional activity;For patient/therapist safety;To address functional/ADL transfers PT goals addressed during session: Mobility/safety with mobility;Balance OT goals addressed during session: ADL's and self-care;Other (comment) (Cognition)       AM-PAC PT 6 Clicks Mobility  Outcome Measure Help needed turning from your back to your side while in a flat bed without using bedrails?: Total Help needed moving from lying on your back to sitting on the side of a flat bed without using bedrails?: Total Help needed moving to and from a bed to a chair (including a wheelchair)?: Total Help needed standing up from a chair using your arms (e.g., wheelchair or bedside chair)?: A Lot Help needed to walk in hospital room?: Total Help needed climbing 3-5 steps with a railing? : Total 6 Click Score: 7    End of Session   Activity Tolerance: Patient limited by fatigue;Treatment limited secondary to medical complications (Comment);Other (comment) (Pt stuporous throughout session, following commands inconsistently.) Patient left: in bed;with call bell/phone within reach;with bed alarm set;with family/visitor present Nurse Communication: Mobility status PT Visit Diagnosis: Unsteadiness on feet (R26.81);Muscle weakness (generalized) (M62.81);Other abnormalities of gait and mobility (R26.89)    Time: 8941-8860 PT Time Calculation (min) (ACUTE ONLY): 41 min   Charges:   PT Evaluation $PT Eval High Complexity: 1 High PT Treatments $Therapeutic Activity: 8-22 mins PT General Charges $$ ACUTE PT VISIT: 1 Visit         Sabra Morel, PT, DPT  Acute Rehabilitation Services          Office: [phone]     Sabra MARLA Morel 08/22/2024, 3:13 PM

## 2024-08-22 NOTE — Progress Notes (Signed)
 Patient is much more awake and alert now. Family members at bedside. Interactive/following commands with family members. Swallowed COREG  with sips of water  without any issues.

## 2024-08-22 NOTE — Plan of Care (Signed)
" °  Problem: Respiratory: Goal: Ability to maintain adequate ventilation will improve Outcome: Not Progressing   Problem: Clinical Measurements: Goal: Diagnostic test results will improve Outcome: Not Progressing Goal: Signs and symptoms of infection will decrease Outcome: Not Progressing   Problem: Clinical Measurements: Goal: Cardiovascular complication will be avoided Outcome: Not Progressing   Problem: Coping: Goal: Level of anxiety will decrease Outcome: Not Progressing   Problem: Safety: Goal: Ability to remain free from injury will improve Outcome: Not Progressing   Problem: Safety: Goal: Non-violent Restraint(s) Outcome: Not Progressing   "

## 2024-08-22 NOTE — Evaluation (Signed)
 Occupational Therapy Evaluation Patient Details Name: Leon Estes MRN: [id number] DOB: April 02, 1931 Today's Date: 08/22/2024   History of Present Illness   89yo male admitted from home 08/19/23 with AMS after fall. MRI: no acute findings. CXR: no active disease. BSE 07/28/21: Dys3/thin, meds whole with liquid. Poor dentition. PMH: BPH, HTN, HLD, ischemic cardiomyopathy, PAF, CKD3a, CAD, COPD.     Clinical Impressions At baseline, pt is Independent to Supervision with ADLs, receives assistance for medication management, and performs functional mobility Independent without an AD. Pt now presents with decreased level of alertness, decreased cognition, decreased activity tolerance, decreased balance, and decreased safety and independence with functional tasks. Pt currently largely requiring Max assist to Total assist +2 for ADLs, Max assist +2 for bed mobility, and Mod assist +2 for STS transfers with max multi-modal cues required during all tasks. Pt VSS on RA throughout session. Pt with a large and very supportive family with all family members present reporting they would like for pt to discharge home and are willing and able to provide 24/7 assistance for pt at current level of function. Recommend hospital bed for home use at this time for increased safety for pt and caregivers. Pt will benefit from acute skilled OT services to address deficits, for family training as needed, to decrease caregiver burden, and to increase safety and independence with functional tasks. Post acute discharge, pt will benefit from [facility] OT paired with 24/7 assistance of family.      If plan is discharge home, recommend the following:   Two people to help with walking and/or transfers;Two people to help with bathing/dressing/bathroom;Assistance with cooking/housework;Assistance with feeding;Direct supervision/assist for medications management;Direct supervision/assist for financial management;Assist for transportation;Help  with stairs or ramp for entrance;Supervision due to cognitive status     Functional Status Assessment   Patient has had a recent decline in their functional status and demonstrates the ability to make significant improvements in function in a reasonable and predictable amount of time.     Equipment Recommendations   Hospital bed     Recommendations for Other Services         Precautions/Restrictions   Precautions Precautions: Fall Recall of Precautions/Restrictions: Impaired Precaution/Restrictions Comments: Pt with wrist restraints currently due to agitation and pulling at lines. Restrictions Weight Bearing Restrictions Per Provider Order: No     Mobility Bed Mobility Overal bed mobility: Needs Assistance Bed Mobility: Supine to Sit, Sit to Supine     Supine to sit: Max assist, +2 for physical assistance Sit to supine: Max assist, +2 for physical assistance   General bed mobility comments: Trunk and LE assist required. Increased wakefulness noted once seated EOB. MinA/CGA for trunk control EOB.    Transfers Overall transfer level: Needs assistance Equipment used: 2 person hand held assist Transfers: Sit to/from Stand Sit to Stand: Mod assist, +2 physical assistance           General transfer comment: STS x3 completed. Pt requires B knee blocking initially with assist to initiate stance. Improved to minAx2 for consecutive attempts without need to block knees. Remains standing ~2 minutes during last attempt. Attempts to push PT away during last stand, not in aggressive manner.      Balance Overall balance assessment: Needs assistance Sitting-balance support: Bilateral upper extremity supported Sitting balance-Leahy Scale: Fair Sitting balance - Comments: MinA/CGA seated balance EOB. Pt frequently leaning forward in attempts to stand.   Standing balance support: Bilateral upper extremity supported Standing balance-Leahy Scale: Fair Standing balance  comment:  Pt initially with poor standing balance, requiring knee blocking and hip overpressure to remain standing. Progresses to minAx2 with hand held assist for balance. Knee block no longer required.                           ADL either performed or assessed with clinical judgement   ADL Overall ADL's : Needs assistance/impaired Eating/Feeding: NPO   Grooming: Wash/dry face;Wash/dry hands;Maximal assistance;Cueing for sequencing (Cues for initation; sitting EOB with CGA to Min assist to maintain sitting balance)   Upper Body Bathing: Maximal assistance;Total assistance;Bed level;Cueing for sequencing (Cues for initation; cues to maintain attention to task)   Lower Body Bathing: Total assistance;+2 for physical assistance;+2 for safety/equipment;Bed level   Upper Body Dressing : Maximal assistance;Total assistance;Cueing for sequencing (Cues for initation)   Lower Body Dressing: Total assistance;+2 for physical assistance;+2 for safety/equipment;Bed level     Toilet Transfer Details (indicate cue type and reason): deferred for pt/therapist safety this session Toileting- Clothing Manipulation and Hygiene: Total assistance;+2 for physical assistance;+2 for safety/equipment;Bed level         General ADL Comments: Pt with very limited ability to participate in an d follow cues during functional tasks; however, pt demonstrating limited ability to assist with UB ADLs with OT initiating tasks with hand-over-hand assist     Vision Baseline Vision/History: 6 Macular Degeneration Patient Visual Report: Other (comment) (Macular degeneration with decreased vision at baseline per family; pt currently unable to state if any changes from baseline) Additional Comments: Difficult to assess due to level of alertness and decreased cognition; known vision deficits at baseline; plan to further assess vision during functional tasks in future sessions     Perception         Praxis          Pertinent Vitals/Pain Pain Assessment Pain Assessment: No/denies pain Faces Pain Scale: No hurt     Extremity/Trunk Assessment Upper Extremity Assessment Upper Extremity Assessment: Right hand dominant;Difficult to assess due to impaired cognition (Pt currently not able to answer most questions or follow cues related to sensation or MMT. ROM appears largely WNL with mildly decreased B shoulder AROM. Pt with spontaneous movement of B UE and grasping/releasing ojects.)   Lower Extremity Assessment Lower Extremity Assessment: Defer to PT evaluation   Cervical / Trunk Assessment Cervical / Trunk Assessment: Kyphotic   Communication Communication Communication: Impaired Factors Affecting Communication: Hearing impaired;Difficulty expressing self;Reduced clarity of speech   Cognition Arousal: Suspect due to medications, Obtunded, Stuporous, Lethargic Behavior During Therapy: Flat affect Cognition: Cognition impaired   Orientation impairments: Place, Time, Situation (Oriented to self, wife, daughters, and grandsons) Awareness: Intellectual awareness intact, Online awareness impaired Memory impairment (select all impairments): Short-term memory, Working civil service fast streamer, Conservation officer, historic buildings (Family reports some short-term memory loss and decreased safety awareness at baseline, but pt memory currently significantly below baseline per family.) Attention impairment (select first level of impairment): Focused attention, Sustained attention Executive functioning impairment (select all impairments): Initiation, Organization, Sequencing, Reasoning, Problem solving OT - Cognition Comments: Pt asleep and Stuporous upon arrival and with minimal response to max multi-modal cues from therapists of family while in bed at beginning and end of session, suspect due to medication. However, pt with Lethargic to Obtunded when transferted to sit EOB with Total assist +2 and with pt making good attempt to  participate while sitting but conitnuing to be limited by level of alertness and decreased cognition.  Following commands: Impaired Following commands impaired: Follows one step commands inconsistently     Cueing  General Comments   Cueing Techniques: Verbal cues;Tactile cues;Visual cues  VSS on RA throughout session. Multiple family members present and very supportive throughout session   Exercises     Shoulder Instructions      Home Living Family/patient expects to be discharged to:: Private residence Living Arrangements: Spouse/significant other Available Help at Discharge: Family;Available 24 hours/day (Wife available 24/7. Adult children, adult grandchildren, and adult and minor great-grandchildren live near by and can all assist with at least one daughter, who is a retired LAWYER, it trainer.) Type of Home: House Home Access: Stairs to enter;Ramped entrance [employer] of Steps: 2-3 (at backdoor; also have deck with ramp) Entrance Stairs-Rails: Right Home Layout: One level     Bathroom Shower/Tub: Walk-in shower         Home Equipment: Agricultural Consultant (2 wheels);BSC/3in1;Rollator (4 wheels);Wheelchair - manual;Grab bars - tub/shower (hurrycane; handheld urinal)   Additional Comments: Multiple family members live 5-10 minutes away. Family present to provide home set up during session.      Prior Functioning/Environment Prior Level of Function : Independent/Modified Independent;Needs assist             Mobility Comments: Ind, likes to drive golf cart around. ADLs Comments: Ind to Supervision, wife will set up showering or dressing for patient when needed. Wife assists with medication management.     OT Problem List: Decreased activity tolerance;Impaired balance (sitting and/or standing);Decreased safety awareness;Decreased knowledge of use of DME or AE;Decreased knowledge of precautions   OT Treatment/Interventions:  Self-care/ADL training;Therapeutic exercise;Energy conservation;DME and/or AE instruction;Therapeutic activities;Cognitive remediation/compensation;Visual/perceptual remediation/compensation;Patient/family education;Balance training      OT Goals(Current goals can be found in the care plan section)   Acute Rehab OT Goals Patient Stated Goal: pt unable to state; family agree on goal to bring pt home with family providing 24/7 care in the home OT Goal Formulation: With family Time For Goal Achievement: 09/05/24 Potential to Achieve Goals: Fair ADL Goals Pt Will Perform Grooming: with min assist;sitting Pt Will Perform Upper Body Bathing: with min assist;sitting Pt Will Perform Lower Body Dressing: with mod assist;sit to/from stand Pt Will Transfer to Toilet: with min assist;ambulating;bedside commode (with least restrictive AD) Pt Will Perform Toileting - Clothing Manipulation and hygiene: with mod assist;sit to/from stand Additional ADL Goal #1: Patient will demonstrate ability to appropriately follow 1-step commands in 9/10 opportunities with Mod I for increased safety and independence with functional tasks.   OT Frequency:  Min 2X/week    Co-evaluation PT/OT/SLP Co-Evaluation/Treatment: Yes Reason for Co-Treatment: Complexity of the patient's impairments (multi-system involvement);Necessary to address cognition/behavior during functional activity;For patient/therapist safety;To address functional/ADL transfers PT goals addressed during session: Mobility/safety with mobility;Balance OT goals addressed during session: ADL's and self-care;Other (comment) (Cognition)      AM-PAC OT 6 Clicks Daily Activity     Outcome Measure Help from another person eating meals?: Total (NPO) Help from another person taking care of personal grooming?: A Lot Help from another person toileting, which includes using toliet, bedpan, or urinal?: Total Help from another person bathing (including washing,  rinsing, drying)?: A Lot Help from another person to put on and taking off regular upper body clothing?: A Lot Help from another person to put on and taking off regular lower body clothing?: Total 6 Click Score: 9   End of Session Equipment Utilized During Treatment: Gait belt Nurse Communication: Mobility status;Other (comment) (OT POC; family  willing and able to provide 24/7 assist at home)  Activity Tolerance: Patient limited by lethargy;Other (comment) (Pt limited by decreased cognition) Patient left: in bed;with call bell/phone within reach;with bed alarm set;with family/visitor present  OT Visit Diagnosis: Other abnormalities of gait and mobility (R26.89);Muscle weakness (generalized) (M62.81);Other symptoms and signs involving cognitive function                Time: 8941-8860 OT Time Calculation (min): 41 min Charges:  OT General Charges $OT Visit: 1 Visit OT Evaluation $OT Eval Moderate Complexity: 1 Mod  Margarie Rockey HERO., OTR/L, MA Acute Rehab [phone]   Margarie FORBES Horns 08/22/2024, 2:31 PM

## 2024-08-22 NOTE — Evaluation (Signed)
 Clinical/Bedside Swallow Evaluation Patient Details  Name: Leon Estes MRN: [id number] Date of Birth: 1930/11/21  Today's Date: 08/22/2024 Time: SLP Start Time (ACUTE ONLY): 9073 SLP Stop Time (ACUTE ONLY): 0953 SLP Time Calculation (min) (ACUTE ONLY): 27 min  Past Medical History:  Past Medical History:  Diagnosis Date   Asthma    BPH (benign prostatic hypertrophy)    Cataract    Chronic renal disease, stage III (HCC)    Chronic sinusitis    COPD (chronic obstructive pulmonary disease) (HCC)    Coronary artery disease    2 stents   Gall stones    Hearing loss    DOES NOT WEAR HEARING AIDS   High cholesterol    Hypertension    Lactic acid acidosis 10/12/2021   Macular degeneration    right eye   Rash 12/04/2021   Septic shock (HCC) 10/10/2021   Past Surgical History:  Past Surgical History:  Procedure Laterality Date   APPENDECTOMY     CORONARY ANGIOPLASTY WITH STENT PLACEMENT  1998   2 stents   TRANSURETHRAL RESECTION OF PROSTATE     HPI:  89yo male admitted from home 08/19/23 with AMS after fall. MRI: no acute findings. CXR: no active disease. BSE 07/28/21: Dys3/thin, meds whole with liquid. Poor dentition. PMH: BPH, HTN, HLD, ischemic cardiomyopathy, PAF, CKD3a, CAD, COPD    Assessment / Plan / Recommendation  Clinical Impression  RN reports pt without sedative meds since around 5pm on 08/21/24 and that he was conversing with MD this am. Upon SLP arrival, pt asleep and required moderate verbal, tactile stimulation cues to awaken. Pt with brief eye opening and with one word verbalizations, indicating to SLP that he was hungry/thristy and wanted ice chips. Pt unable to follow commands for oral motor exam due to fatigue and poor mentation, however no focal deficits noted, suspect some generalized oral weakness. He actively accepted x2 ice chips, manipulated slowly, but ultimately cleared from oral cavity with palpable swallow noted. An additional ice chip was offered and pt  fatigue increased and pt orally held bolus, cough response exhibited and suspect delay in swallow initation due to poor attention. Daughter entered room and started conversing with pt and he subsequently aroused and began communicating. Daughter requested reattempt with ice chip and maybe meds. Another ice chip administered and pt exhibited x1 cough due to poor attention as it relates to rapidly flucutating attention as noted previously. MD entered room and SLP reported presentation.       PLAN: Recommend continue NPO with QID oral care as attention and alertness fluctuate so quickly, which raises concerns for swallow safety even with ice chips. SLP will f/u for ongoing PO trials. If alertness greatly improves, please message SLP team. His alertness and mentation appear to be primary factor in regards to safe/adequate intake at this time.  SLP Visit Diagnosis: Dysphagia, unspecified (R13.10)    Aspiration Risk  Severe aspiration risk    Diet Recommendation           Other Recommendations Oral Care Recommendations: Oral care QID;Staff/trained caregiver to provide oral care     Swallow Evaluation Recommendations Recommendations: NPO Medication Administration: Via alternative means   Assistance Recommended at Discharge    Functional Status Assessment Patient has had a recent decline in their functional status and demonstrates the ability to make significant improvements in function in a reasonable and predictable amount of time.  Frequency and Duration min 2x/week  2 weeks  Prognosis Prognosis for improved oropharyngeal function: Fair Barriers to Reach Goals: Cognitive deficits      Swallow Study   General Date of Onset: 08/18/24 HPI: 89yo male admitted from home 08/19/23 with AMS after fall. MRI: no acute findings. CXR: no active disease. BSE 07/28/21: Dys3/thin, meds whole with liquid. Poor dentition. PMH: BPH, HTN, HLD, ischemic cardiomyopathy, PAF, CKD3a, CAD, COPD Type of Study:  Bedside Swallow Evaluation Previous Swallow Assessment: BSE 07/28/21: Dys3/thin, meds whole with liquid. Poor dentition. Diet Prior to this Study: NPO Temperature Spikes Noted: No Respiratory Status: Nasal cannula History of Recent Intubation: No Behavior/Cognition: Lethargic/Drowsy;Confused Oral Cavity Assessment: Dry Oral Care Completed by SLP: No Oral Cavity - Dentition:  (difficult to assess) Vision:  (difficult to assess) Self-Feeding Abilities: Total assist Patient Positioning: Upright in bed;Postural control adequate for testing Baseline Vocal Quality: Normal    Oral/Motor/Sensory Function Overall Oral Motor/Sensory Function: Generalized oral weakness (no obvious facial/lingual motor deficits)   Ice Chips Ice chips: Impaired Presentation: Spoon Oral Phase Impairments: Reduced lingual movement/coordination;Impaired mastication;Poor awareness of bolus Oral Phase Functional Implications: Prolonged oral transit;Oral holding Pharyngeal Phase Impairments: Suspected delayed Swallow;Cough - Immediate   Thin Liquid Thin Liquid: Not tested    Nectar Thick Nectar Thick Liquid: Not tested   Honey Thick Honey Thick Liquid: Not tested   Puree Puree: Not tested   Solid     Solid: Not tested       [name], MA, CCC-SLP Acute Rehabilitation Services Office Number: [phone]  [name] 08/22/2024,10:15 AM

## 2024-08-22 NOTE — Progress Notes (Addendum)
 "                        PROGRESS NOTE        PATIENT DETAILS Name: Leon Estes Age: 89 y.o. Sex: male Date of Birth: 08-24-1930 Admit Date: 08/18/2024 Admitting Physician Evalene GORMAN Sprinkles, MD ERE:Antj, Reuben POUR, MD  Brief Summary: Patient is a 89 y.o.  male with history of HTN, HLD, PAF not anticoagulated, CKD stage IIIa, COPD, dementia/cognitive issues-who presented with altered mental status.  Per family-dog died recently and has had significant decrease in oral intake since then.  Significant events: 1/7>> admit to [facility]  Significant studies: 1/6>> CT head: No acute intracranial normality 1/6>> CT spine: No fracture 1/6>> CXR: Mild peribronchial thickening/interstitial prominence-possible atypical infection/bronchitis. 1/7>> MRI brain: No acute findings 1/7>> TSH: Normal limits 1/7>> vitamin B12: 650 1/7>> RPR: Nonreactive 1/7>> ammonia: Normal 1/8>> EEG: No seizures.  Significant microbiology data: 1/6>> COVID/influenza/RSV PCR: Negative 1/6>> blood culture: No growth  Procedures: None  Consults: None  Subjective: Has had intermittent issues with agitation overnight.  Sleepy/lethargic this morning-got multiple doses of Haldol  last night.  Although sleepy/lethargic-he will open his eyes-when called out-he seems to respond appropriately when asked simple questions (hard to discern-he does not have dentures).  His daughter subsequently came by later this morning-and he seems to be interacting with her relatively well when he wakes up but is still dozing off at times.  Objective: Vitals: Blood pressure 131/62, pulse 100, temperature 98.9 F (37.2 C), temperature source Axillary, resp. rate (!) 24, height 5' 8 (1.727 m), weight 68.8 kg, SpO2 97%.   Exam: Sleepy but not in any distress Slightly tachypneic Chest: Clear to auscultation CVS: S1-S2 regular Abdomen: Soft nontender Neurology:Nonfocal.   Pertinent Labs/Radiology:    Latest Ref Rng & Units 08/21/2024     3:49 AM 08/20/2024    4:32 AM 08/19/2024    5:02 AM  CBC  WBC 4.0 - 10.5 K/uL 10.9  13.6  12.6   Hemoglobin 13.0 - 17.0 g/dL 88.6  87.9  88.7   Hematocrit 39.0 - 52.0 % 35.2  37.2  35.5   Platelets 150 - 400 K/uL 240  235  216     Lab Results  Component Value Date   NA 141 08/22/2024   K 3.6 08/22/2024   CL 102 08/22/2024   CO2 20 (L) 08/22/2024      Assessment/Plan: Acute metabolic encephalopathy Unclear etiology-suspected dehydration-apparently dog died recently and patient has not eaten or drank since then.  Reviewed outpatient notes-he does have some cognitive issues-and has vision and hearing loss as well. Extensive workup negative so far Neck is supple and exam-doubt meningitis Patient tends to wax/wane-but with family around-he does seems to respond appropriately.  High suspicion that this is probably delirium related to his underlying dementia-worsened as he is outside of his familiar surroundings. Talked with daughter at bedside on 1/10-she plans to get a family to be in the hospital 24/7 so that we can minimize use of Haldol  when he gets these episodes of delirium-as he seems to respond to family well.  His UA was equivocal for UTI-Will send urine culture-he did receive a few doses of cefepime  earlier-I will give him 3 more doses of IV Rocephin  to see if this helps but doubt UTI. Once he is a bit more awake-will start scheduled Seroquel .  Addendum Spoke with nursing staff-he is now much more awake and alert this evening-family is at bedside-patient  is interacting with family appropriately.  He was able to swallow Coreg  without any issues.  Continue to maintain strict delirium precautions-family plans to be with him 24/7.  AKI  Hemodynamically mediated Resolved Follow electrolytes   Hypokalemia  Repleted.   COPD  Not in exacerbation Bronchodilators Stage scheduled bronchodilators   Chronic HFmrEF (EF 45% September 2025) Hold Lasix /losartan -unable to take any oral  meds due to AMS Will resume oral meds when able.  PAF  Follows with [facility], not anticoagulated due to falls  Continue Coreg      Dementia  Use delirium precautions   See above   Complete opacification of left maxillary sinus with soft tissue extending through the ostium-likely reflecting a polyp ED MD discussed with ENT (Dr. Earlie follow-up is recommended  Goals of care DNR in place Discussed with daughter on 1/10-not a candidate for aggressive medical care-no role for further escalation-do not think he will benefit from tube feeds at this point-daughter does not think that the family will want to go that route. Hospital course complicated by severe delirium-requiring several doses of Haldol  etc. that basically have caused him to become lethargic.  Family is planning to stay at bedside 24/7 from today-I suspect that will probably be very beneficial as he seems to respond well to them.  We will discontinue telemetry-discontinue restraints/mittens-and minimize Haldol  use as much as possible.  Once he perks up a bit more-I suspect we will need to get SLP to reevaluate him-and place him on a diet.  We we will allow some time for clinical outcomes-but suspect if he does not improve with these measures-next up would be comfort care/hospice.  I have consulted palliative care.  Code status:   Code Status: Limited: Do not attempt resuscitation (DNR) -DNR-LIMITED -Do Not Intubate/DNI    DVT Prophylaxis: enoxaparin  (LOVENOX ) injection 40 mg Start: 08/19/24 1400   Family Communication: Daughter at bedside on 1/10   Disposition Plan: Status is: Inpatient Remains inpatient appropriate because: Severity of illness   Planned Discharge Destination:Home health   Diet: Diet Order             Diet NPO time specified  Diet effective now                     Antimicrobial agents: Anti-infectives (From admission, onward)    Start     Dose/Rate Route Frequency Ordered  Stop   08/22/24 0800  cefTRIAXone  (ROCEPHIN ) 2 g in sodium chloride  0.9 % 100 mL IVPB        2 g 200 mL/hr over 30 Minutes Intravenous Daily 08/22/24 0705 08/25/24 0959   08/20/24 0500  vancomycin  (VANCOREADY) IVPB 750 mg/150 mL  Status:  Discontinued        750 mg 150 mL/hr over 60 Minutes Intravenous Every 24 hours 08/19/24 0922 08/19/24 1856   08/19/24 2000  doxycycline  (VIBRAMYCIN ) 100 mg in sodium chloride  0.9 % 250 mL IVPB  Status:  Discontinued        100 mg 125 mL/hr over 120 Minutes Intravenous Every 12 hours 08/19/24 1910 08/20/24 1155   08/19/24 1945  azithromycin  (ZITHROMAX ) 500 mg in sodium chloride  0.9 % 250 mL IVPB  Status:  Discontinued        500 mg 250 mL/hr over 60 Minutes Intravenous Every 24 hours 08/19/24 1856 08/19/24 1910   08/19/24 1200  ceFEPIme  (MAXIPIME ) 2 g in sodium chloride  0.9 % 100 mL IVPB  Status:  Discontinued  2 g 200 mL/hr over 30 Minutes Intravenous Every 12 hours 08/19/24 0408 08/19/24 1856   08/19/24 1000  metroNIDAZOLE  (FLAGYL ) IVPB 500 mg  Status:  Discontinued        500 mg 100 mL/hr over 60 Minutes Intravenous Every 12 hours 08/19/24 0359 08/19/24 1856   08/19/24 0408  vancomycin  variable dose per unstable renal function (pharmacist dosing)  Status:  Discontinued         Does not apply See admin instructions 08/19/24 0408 08/19/24 0922   08/18/24 2345  vancomycin  (VANCOREADY) IVPB 1500 mg/300 mL        1,500 mg 150 mL/hr over 120 Minutes Intravenous  Once 08/18/24 2331 08/19/24 0451   08/18/24 2345  ceFEPIme  (MAXIPIME ) 2 g in sodium chloride  0.9 % 100 mL IVPB        2 g 200 mL/hr over 30 Minutes Intravenous  Once 08/18/24 2332 08/19/24 0127   08/18/24 2330  aztreonam  (AZACTAM ) 2 g in sodium chloride  0.9 % 100 mL IVPB  Status:  Discontinued        2 g 200 mL/hr over 30 Minutes Intravenous  Once 08/18/24 2326 08/18/24 2331   08/18/24 2330  metroNIDAZOLE  (FLAGYL ) IVPB 500 mg        500 mg 100 mL/hr over 60 Minutes Intravenous  Once  08/18/24 2326 08/19/24 0230   08/18/24 2330  vancomycin  (VANCOCIN ) IVPB 1000 mg/200 mL premix  Status:  Discontinued        1,000 mg 200 mL/hr over 60 Minutes Intravenous  Once 08/18/24 2326 08/18/24 2331        MEDICATIONS: Scheduled Meds:  budesonide  (PULMICORT ) nebulizer solution  0.25 mg Nebulization BID   carvedilol   3.125 mg Oral BID WC   enoxaparin  (LOVENOX ) injection  40 mg Subcutaneous Q24H   pantoprazole  (PROTONIX ) IV  40 mg Intravenous Q24H   QUEtiapine   25 mg Per Tube BID   revefenacin   175 mcg Nebulization Daily   simvastatin   40 mg Oral QHS   sodium chloride  flush  10-40 mL Intracatheter Q12H   sodium chloride  flush  3 mL Intravenous Q12H   tamsulosin   0.4 mg Oral Daily   Continuous Infusions:  cefTRIAXone  (ROCEPHIN )  IV 2 g (08/22/24 0831)   PRN Meds:.acetaminophen  **OR** acetaminophen , haloperidol  lactate, hydrALAZINE , ipratropium-albuterol , metoprolol  tartrate, prochlorperazine , senna-docusate, sodium chloride  flush   I have personally reviewed following labs and imaging studies  LABORATORY DATA: CBC: Recent Labs  Lab 08/18/24 2016 08/18/24 2031 08/19/24 0502 08/20/24 0432 08/21/24 0349  WBC  --  15.8* 12.6* 13.6* 10.9*  NEUTROABS  --  10.8*  --   --   --   HGB 17.3* 15.2 11.2* 12.0* 11.3*  HCT 51.0 48.0 35.5* 37.2* 35.2*  MCV  --  92.7 93.2 90.7 91.9  PLT  --  278 216 235 240    Basic Metabolic Panel: Recent Labs  Lab 08/18/24 2031 08/19/24 0502 08/20/24 0432 08/21/24 0349 08/22/24 0335  NA 138 137 139 140 141  K 3.3* 3.3* 3.2* 3.2* 3.6  CL 95* 101 99 100 102  CO2 29 27 23  17* 20*  GLUCOSE 113* 91 76 104* 92  BUN 11 14 11 15 18   CREATININE 1.41* 1.16 0.97 0.91 1.07  CALCIUM  10.1 8.5* 8.8* 8.8* 8.8*  MG  --  1.8  --   --   --     GFR: Estimated Creatinine Clearance: 43.5 mL/min (by C-G formula based on SCr of 1.07 mg/dL).  Liver Function Tests: Recent Labs  Lab 08/18/24 2031  AST 38  ALT 19  ALKPHOS 110  BILITOT 0.8  PROT  8.6*  ALBUMIN  4.5   No results for input(s): LIPASE, AMYLASE in the last 168 hours. Recent Labs  Lab 08/19/24 0358  AMMONIA 26    Coagulation Profile: Recent Labs  Lab 08/18/24 2031  INR 1.0    Cardiac Enzymes: No results for input(s): CKTOTAL, CKMB, CKMBINDEX, TROPONINI in the last 168 hours.  BNP (last 3 results) No results for input(s): PROBNP in the last 8760 hours.  Lipid Profile: No results for input(s): CHOL, HDL, LDLCALC, TRIG, CHOLHDL, LDLDIRECT in the last 72 hours.  Thyroid  Function Tests: Recent Labs    08/20/24 0432  TSH 1.700    Anemia Panel: No results for input(s): VITAMINB12, FOLATE, FERRITIN, TIBC, IRON, RETICCTPCT in the last 72 hours.   Urine analysis:    Component Value Date/Time   COLORURINE AMBER (A) 08/19/2024 0320   APPEARANCEUR HAZY (A) 08/19/2024 0320   APPEARANCEUR Clear 04/29/2013 1543   LABSPEC 1.032 (H) 08/19/2024 0320   LABSPEC 1.020 04/29/2013 1543   PHURINE 5.0 08/19/2024 0320   GLUCOSEU NEGATIVE 08/19/2024 0320   GLUCOSEU Negative 04/29/2013 1543   HGBUR MODERATE (A) 08/19/2024 0320   BILIRUBINUR SMALL (A) 08/19/2024 0320   BILIRUBINUR Negative 03/14/2022 1506   BILIRUBINUR Negative 04/29/2013 1543   KETONESUR 5 (A) 08/19/2024 0320   PROTEINUR >=300 (A) 08/19/2024 0320   UROBILINOGEN 0.2 03/14/2022 1506   UROBILINOGEN 0.2 03/08/2012 1624   NITRITE NEGATIVE 08/19/2024 0320   LEUKOCYTESUR NEGATIVE 08/19/2024 0320   LEUKOCYTESUR Negative 04/29/2013 1543    Sepsis Labs: Lactic Acid, Venous    Component Value Date/Time   LATICACIDVEN 1.9 08/19/2024 0246    MICROBIOLOGY: Recent Results (from the past 240 hours)  Resp panel by RT-PCR (RSV, Flu A&B, Covid) Anterior Nasal Swab     Status: None   Collection Time: 08/18/24 11:16 PM   Specimen: Anterior Nasal Swab  Result Value Ref Range Status   SARS Coronavirus 2 by RT PCR NEGATIVE NEGATIVE Final   Influenza A by PCR NEGATIVE  NEGATIVE Final   Influenza B by PCR NEGATIVE NEGATIVE Final    Comment: (NOTE) The Xpert Xpress SARS-CoV-2/FLU/RSV plus assay is intended as an aid in the diagnosis of influenza from Nasopharyngeal swab specimens and should not be used as a sole basis for treatment. Nasal washings and aspirates are unacceptable for Xpert Xpress SARS-CoV-2/FLU/RSV testing.  Fact Sheet for Patients: bloggercourse.com  Fact Sheet for Healthcare Providers: seriousbroker.it  This test is not yet approved or cleared by the [place]  FDA and has been authorized for detection and/or diagnosis of SARS-CoV-2 by FDA under an Emergency Use Authorization (EUA). This EUA will remain in effect (meaning this test can be used) for the duration of the COVID-19 declaration under Section 564(b)(1) of the Act, 21 U.S.C. section 360bbb-3(b)(1), unless the authorization is terminated or revoked.     Resp Syncytial Virus by PCR NEGATIVE NEGATIVE Final    Comment: (NOTE) Fact Sheet for Patients: bloggercourse.com  Fact Sheet for Healthcare Providers: seriousbroker.it  This test is not yet approved or cleared by the [place]  FDA and has been authorized for detection and/or diagnosis of SARS-CoV-2 by FDA under an Emergency Use Authorization (EUA). This EUA will remain in effect (meaning this test can be used) for the duration of the COVID-19 declaration under Section 564(b)(1) of the Act, 21 U.S.C. section 360bbb-3(b)(1), unless the authorization is terminated or revoked.  Performed at  [facility], [address] [place], [place] [address]   Culture, blood (single)     Status: None (Preliminary result)   Collection Time: 08/18/24 11:26 PM   Specimen: BLOOD RIGHT ARM  Result Value Ref Range Status   Specimen Description BLOOD RIGHT ARM  Final   Special Requests   Final    BOTTLES DRAWN AEROBIC ONLY  Blood Culture adequate volume   Culture   Final    NO GROWTH 3 DAYS Performed at [facility], [address], [place], [place] [address]    Report Status PENDING  Incomplete    RADIOLOGY STUDIES/RESULTS: DG CHEST PORT 1 VIEW Result Date: 08/21/2024 EXAM: 1 VIEW(S) XRAY OF THE CHEST 08/21/2024 10:29:39 PM COMPARISON: 08/19/2024 CLINICAL HISTORY: Acute respiratory distress FINDINGS: LUNGS AND PLEURA: There are some interstitial opacities in the lung bases, left greater than right, which may be related to mild infection or edema. No pleural effusion. No pneumothorax. HEART AND MEDIASTINUM: Aortic atherosclerosis. No acute abnormality of the cardiac and mediastinal silhouettes. BONES AND SOFT TISSUES: No acute osseous abnormality. IMPRESSION: 1. Mild bibasilar interstitial opacities, left greater than right, which may reflect infection or edema. Electronically signed by: Greig Pique MD MD 08/21/2024 10:37 PM EST RP Workstation: [id number]   EEG adult Result Date: 08/20/2024 Shelton Arlin KIDD, MD     08/20/2024  5:27 PM Patient Name: JAMICHEAL HEARD MRN: [id number] Epilepsy Attending: Arlin KIDD Shelton Referring Physician/Provider: Raenelle Donalda CHRISTELLA, MD Date:  08/20/2024 Duration: 24.06 mins Patient history: 89yo M with ams. EEG to evaluate for seizure Level of alertness: Awake AEDs during EEG study: Ativan  Technical aspects: This EEG study was done with scalp electrodes positioned according to the 10-20 International system of electrode placement. Electrical activity was reviewed with band pass filter of 1-70Hz , sensitivity of 7 uV/mm, display speed of 24mm/sec with a 60Hz  notched filter applied as appropriate. EEG data were recorded continuously and digitally stored.  Video monitoring was available and reviewed as appropriate. Description: The posterior dominant rhythm consists of 9 Hz activity of moderate voltage (25-35 uV) seen predominantly in posterior head regions, symmetric and reactive to eye  opening and eye closing. Hyperventilation and photic stimulation were not performed.   IMPRESSION: This study is within normal limits. No seizures or epileptiform discharges were seen throughout the recording. A normal interictal EEG does not exclude the diagnosis of epilepsy. Priyanka KIDD Shelton     LOS: 3 days   Donalda Raenelle, MD  [facility]    To contact the attending provider between 7A-7P or the covering provider during after hours 7P-7A, please log into the web site [email or web address] and access using universal [place] password for that web site. If you do not have the password, please call the hospital operator.  08/22/2024, 10:12 AM    "

## 2024-08-23 DIAGNOSIS — Z7189 Other specified counseling: Secondary | ICD-10-CM

## 2024-08-23 DIAGNOSIS — Z515 Encounter for palliative care: Secondary | ICD-10-CM

## 2024-08-23 DIAGNOSIS — Z711 Person with feared health complaint in whom no diagnosis is made: Secondary | ICD-10-CM

## 2024-08-23 LAB — BASIC METABOLIC PANEL WITH GFR
Anion gap: 19 — ABNORMAL HIGH (ref 5–15)
BUN: 21 mg/dL (ref 8–23)
CO2: 24 mmol/L (ref 22–32)
Calcium: 9.6 mg/dL (ref 8.9–10.3)
Chloride: 106 mmol/L (ref 98–111)
Creatinine, Ser: 1.1 mg/dL (ref 0.61–1.24)
GFR, Estimated: >60 mL/min
Glucose, Bld: 127 mg/dL — ABNORMAL HIGH (ref 70–99)
Potassium: 3.8 mmol/L (ref 3.5–5.1)
Sodium: 149 mmol/L — ABNORMAL HIGH (ref 135–145)

## 2024-08-23 LAB — URINE CULTURE: Culture: NO GROWTH

## 2024-08-23 MED ORDER — DEXTROSE 5 % IV SOLN: INTRAVENOUS | Status: AC

## 2024-08-23 MED ORDER — ENSURE PLUS HIGH PROTEIN PO LIQD
237.0000 mL | Freq: Two times a day (BID) | ORAL | Status: DC
  Administered 2024-08-24: 237 mL via ORAL

## 2024-08-23 NOTE — Progress Notes (Addendum)
 Speech Language Pathology Treatment: Dysphagia  Patient Details Name: Leon Estes MRN: [id number] DOB: 07/28/1931 Today's Date: 08/23/2024 Time: 8567-8552 SLP Time Calculation (min) (ACUTE ONLY): 15 min  Assessment / Plan / Recommendation Clinical Impression  Pt was seen for skilled ST targeting readiness for diet initiation.  New BSE order was placed; however, pt is already on caseload and therefore was seen for a treatment session.  Wife and daughter were present at bedside.  Pt is notably HOH and has visual deficits and subsequently benefited from full assistance with PO intake.  Pt was seen with trials of ice chips, thin liquid and puree.  He expelled the first ice chip trial from his oral cavity secondary to confusion and was initially unable to mobilize liquid from the straw into his oral cavity; however given moderate cueing from SLP and wife, he was able to accept trials into his oral cavity without difficulty.  Pt exhibited a delayed cough in 1/7 trials of thin liquid, but no additional clinical s/sx of aspiration were observed with PO intake including consecutive sips of thin liquid (>2oz).    Plan: Recommend a Dysphagia 1 (puree) and thin liquid diet with meds crushed in puree and full supervision/strict adherence to aspiration precautions.  SLP will f/u to monitor diet tolerance.     HPI HPI: 89yo male admitted from home 08/19/23 with AMS after fall. MRI: no acute findings. CXR: no active disease. BSE 07/28/21: Dys3/thin, meds whole with liquid. Poor dentition. PMH: BPH, HTN, HLD, ischemic cardiomyopathy, PAF, CKD3a, CAD, COPD      SLP Plan  Continue with current plan of care        Swallow Evaluation Recommendations   Recommendations: PO diet PO Diet Recommendation: Dysphagia 1 (Pureed);Thin liquids (Level 0) Liquid Administration via: Spoon;Straw;Cup Medication Administration: Crushed with puree Supervision: Full assist for feeding;Full supervision/cueing for swallowing  strategies Postural changes: Position pt fully upright for meals Oral care recommendations: Oral care BID (2x/day)     Recommendations                     Oral care BID   Frequent or constant Supervision/Assistance Dysphagia, unspecified (R13.10)     Continue with current plan of care    Earnie Cable, M.S., CCC-SLP Acute Rehabilitation Services Office: [phone]  Earnie SQUIBB [facility]  08/23/2024, 2:56 PM

## 2024-08-23 NOTE — Plan of Care (Signed)
" °  Problem: Clinical Measurements: Goal: Diagnostic test results will improve Outcome: Progressing Goal: Signs and symptoms of infection will decrease Outcome: Progressing   Problem: Education: Goal: Knowledge of General Education information will improve Description: Including pain rating scale, medication(s)/side effects and non-pharmacologic comfort measures Outcome: Progressing   Problem: Clinical Measurements: Goal: Diagnostic test results will improve Outcome: Progressing Goal: Respiratory complications will improve Outcome: Progressing   "

## 2024-08-23 NOTE — Progress Notes (Signed)
 SLP Cancellation Note  Patient Details Name: Leon Estes MRN: [id number] DOB: Oct 19, 1930   Cancelled treatment:       Reason Eval/Treat Not Completed: Other (comment).  BSE order received and acknowledged.  Pt is already on caseload and was seen for a BSE yesterday, therefore he was seen for a treatment session today.  Please see progress note from 1/11 for additional information.     [name] 08/23/2024, 3:02 PM

## 2024-08-23 NOTE — Plan of Care (Signed)
  Problem: Fluid Volume: Goal: Hemodynamic stability will improve Outcome: Progressing   Problem: Clinical Measurements: Goal: Diagnostic test results will improve Outcome: Progressing Goal: Signs and symptoms of infection will decrease Outcome: Progressing   Problem: Respiratory: Goal: Ability to maintain adequate ventilation will improve Outcome: Progressing   Problem: Education: Goal: Knowledge of General Education information will improve Description: Including pain rating scale, medication(s)/side effects and non-pharmacologic comfort measures Outcome: Progressing   Problem: Health Behavior/Discharge Planning: Goal: Ability to manage health-related needs will improve Outcome: Progressing   Problem: Clinical Measurements: Goal: Ability to maintain clinical measurements within normal limits will improve Outcome: Progressing Goal: Will remain free from infection Outcome: Progressing Goal: Diagnostic test results will improve Outcome: Progressing Goal: Respiratory complications will improve Outcome: Progressing Goal: Cardiovascular complication will be avoided Outcome: Progressing   Problem: Activity: Goal: Risk for activity intolerance will decrease Outcome: Progressing   Problem: Nutrition: Goal: Adequate nutrition will be maintained Outcome: Progressing   Problem: Coping: Goal: Level of anxiety will decrease Outcome: Progressing   Problem: Elimination: Goal: Will not experience complications related to bowel motility Outcome: Progressing Goal: Will not experience complications related to urinary retention Outcome: Progressing   Problem: Pain Managment: Goal: General experience of comfort will improve and/or be controlled Outcome: Progressing   Problem: Safety: Goal: Ability to remain free from injury will improve Outcome: Progressing   Problem: Skin Integrity: Goal: Risk for impaired skin integrity will decrease Outcome: Progressing   Problem:  Safety: Goal: Non-violent Restraint(s) Outcome: Progressing

## 2024-08-23 NOTE — TOC Initial Note (Signed)
 Transition of Care [facility]) - Initial/Assessment Note    Patient Details  Name: Leon Estes MRN: [id number] Date of Birth: 09-15-1930  Transition of [facility] CM/SW Contact:    Marval Gell, RN Phone Number: 08/23/2024, 4:30 PM  Clinical Narrative:                  Beatris w patient's wife and daughter to set up DC plan. They would like palliative services through [facility] and referral has been made.  They would like HH services to [facility] and refrral has been accepted. Offered DME hospital bed and politely declined, no needs identified for any other DME.  Family states they will transport home    Expected Discharge Plan: Home w Home Health Services Barriers to Discharge: Continued Medical Work up   Patient Goals and CMS Choice Patient states their goals for this hospitalization and ongoing recovery are:: to go home CMS Medicare.gov Compare Post Acute Care list provided to:: Other (Comment Required) Choice offered to / list presented to : Spouse, Adult Children      Expected Discharge Plan and Services   Discharge Planning Services: CM Consult Post Acute Care Choice: Home Health Living arrangements for the past 2 months: Single Family Home                 DME Arranged: N/A         HH Arranged: PT, OT, RN HH Agency: [facility] Date [facility] Agency Contacted: 08/23/24 Time HH Agency Contacted: 1630 Representative spoke with at [facility] Agency: Darleene  Prior Living Arrangements/Services Living arrangements for the past 2 months: Single Family Home Lives with:: Significant Other                   Activities of Daily Living   ADL Screening (condition at time of admission) Independently performs ADLs?: No Does the patient have a NEW difficulty with bathing/dressing/toileting/self-feeding that is expected to last >3 days?: No Does the patient have a NEW difficulty with getting in/out of bed, walking, or climbing stairs that is expected to last >3 days?: No Does the  patient have a NEW difficulty with communication that is expected to last >3 days?: No Is the patient deaf or have difficulty hearing?: No Does the patient have difficulty seeing, even when wearing glasses/contacts?: No Does the patient have difficulty concentrating, remembering, or making decisions?: No  Permission Sought/Granted                  Emotional Assessment              Admission diagnosis:  Acute encephalopathy [G93.40] Severe sepsis (HCC) [A41.9, R65.20] Patient Active Problem List   Diagnosis Date Noted   Acute encephalopathy 08/19/2024   SIRS (systemic inflammatory response syndrome) (HCC) 08/19/2024   Left maxillary sinus opacification 08/19/2024   Vascular dementia without behavioral disturbance, psychotic disturbance, mood disturbance, or anxiety (HCC) 04/09/2023   Osteoarthritis of both hands 01/02/2023   Chronic cholecystitis 08/01/2022   Chronic combined systolic (congestive) and diastolic (congestive) heart failure (HCC) 08/01/2022   Gallstones 07/02/2022   NSVT (nonsustained ventricular tachycardia) (HCC)    Chronic heart failure with mildly reduced ejection fraction (HFmrEF) (HCC) 10/12/2021   PAF (paroxysmal atrial fibrillation) (HCC) 10/12/2021   Reflux esophagitis 09/28/2021   Neurodermatitis 09/15/2021   AKI (acute kidney injury) 07/25/2021   Allergic rhinitis due to pollen 06/12/2021   GERD (gastroesophageal reflux disease) 05/04/2021   Neoplasm of brain causing mass effect on  adjacent structures (HCC) 04/07/2021   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   HOH (hard of hearing) 12/28/2020   Pulmonary nodule 12/06/2020   Preventative health care 10/12/2020   Macular degeneration, wet (HCC) 10/12/2020   BPH (benign prostatic hyperplasia) 01/13/2020   Neuropathy 02/18/2018   COPD (chronic obstructive pulmonary disease) (HCC)    Benign essential HTN 04/09/2017   Atherosclerotic heart disease of native coronary artery with  angina pectoris 03/03/2015   Hyperlipidemia 03/03/2015   PCP:  Bennett Reuben POUR, MD Pharmacy:   [facility] [place], [place] - [address] [place] [address] Phone: [phone] Fax: [phone]  [facility] [address] [place] [address] Phone: [phone] Fax: [phone]  [facility] [address] [place] - [address] [place] [address] Phone: [phone] Fax: [phone]     Social Drivers of Health (SDOH) Social History: SDOH Screenings   Food Insecurity: No Food Insecurity (08/19/2024)  Housing: Low Risk (08/19/2024)  Transportation Needs: No Transportation Needs (08/19/2024)  Utilities: Not At Risk (08/19/2024)  Depression (PHQ2-9): Low Risk (04/09/2024)  Financial Resource Strain: Medium Risk (04/03/2024)   Received from [facility]  Social Connections: Moderately Isolated (08/19/2024)  Tobacco Use: High Risk (08/19/2024)   SDOH Interventions:     Readmission Risk Interventions     No data to display

## 2024-08-23 NOTE — Progress Notes (Addendum)
 "                        PROGRESS NOTE        PATIENT DETAILS Name: Leon Estes Age: 89 y.o. Sex: male Date of Birth: 01-Jul-1931 Admit Date: 08/18/2024 Admitting Physician Evalene GORMAN Sprinkles, MD ERE:Antj, Reuben POUR, MD  Brief Summary: Patient is a 89 y.o.  male with history of HTN, HLD, PAF not anticoagulated, CKD stage IIIa, COPD, dementia/cognitive issues-who presented with altered mental status.  Per family-dog died recently and has had significant decrease in oral intake since then.  Significant events: 1/7>> admit to [facility]  Significant studies: 1/6>> CT head: No acute intracranial normality 1/6>> CT spine: No fracture 1/6>> CXR: Mild peribronchial thickening/interstitial prominence-possible atypical infection/bronchitis. 1/7>> MRI brain: No acute findings 1/7>> TSH: Normal limits 1/7>> vitamin B12: 650 1/7>> RPR: Nonreactive 1/7>> ammonia: Normal 1/8>> EEG: No seizures.  Significant microbiology data: 1/6>> COVID/influenza/RSV PCR: Negative 1/6>> blood culture: No growth  Procedures: None  Consults: Palliative care  Subjective: Patient in bed thoroughly confused, unable to answer questions reliably  Objective: Vitals: Blood pressure (!) 147/70, pulse 100, temperature 98.2 F (36.8 C), temperature source Axillary, resp. rate 16, height 5' 8 (1.727 m), weight 68.8 kg, SpO2 95%.   Exam: Patient in bed, thoroughly confused, moving all 4 extremities by himself Slightly tachypneic Chest: Clear to auscultation CVS: S1-S2 regular Abdomen: Soft nontender Neurology:Nonfocal.    Assessment/Plan:  Note discussed the plan with patient's daughter in detail on 08/23/2024 they are leaning more towards taking him home with home hospice with goal of care being comfort, TOC and palliative care consulted   Acute metabolic encephalopathy Unclear etiology-suspected dehydration-apparently dog died recently and patient has not eaten or drank since then.  Reviewed outpatient  notes-he does have some cognitive issues-and has vision and hearing loss as well. Extensive workup negative so far Neck is supple and exam-doubt meningitis Patient tends to wax/wane-but with family around-he does seems to respond appropriately.  High suspicion that this is probably delirium related to his underlying dementia-worsened as he is outside of his familiar surroundings. Talked with daughter at bedside on 1/10-she plans to get a family to be in the hospital 24/7 so that we can minimize use of Haldol  when he gets these episodes of delirium-as he seems to respond to family well.  His UA was equivocal for UTI-Will send urine culture-he did receive a few doses of cefepime  earlier-I will give him 3 more doses of IV Rocephin  to see if this helps but doubt UTI. Once he is a bit more awake-will start scheduled Seroquel .  Addendum Spoke with nursing staff-he is now much more awake and alert this evening-family is at bedside-patient is interacting with family appropriately.  He was able to swallow Coreg  without any issues.  Continue to maintain strict delirium precautions-family plans to be with him 24/7.  AKI  Hemodynamically mediated Resolved Follow electrolytes   Dehydration, hypernatremia, hypokalemia  IV fluids, potassium repleted.   COPD  Not in exacerbation Bronchodilators Stage scheduled bronchodilators   Chronic HFmrEF (EF 45% September 2025) Hold Lasix /losartan -unable to take any oral meds due to AMS Will resume oral meds when able.  PAF  Follows with [facility], not anticoagulated due to falls  Continue Coreg      Dementia  Use delirium precautions   See above   Complete opacification of left maxillary sinus with soft tissue extending through the ostium-likely reflecting a polyp ED MD discussed  with ENT (Dr. Earlie follow-up is recommended  Goals of care DNR in place Discussed with daughter on 1/10-not a candidate for aggressive medical care-no role  for further escalation-do not think he will benefit from tube feeds at this point-daughter does not think that the family will want to go that route. Hospital course complicated by severe delirium-requiring several doses of Haldol  etc. that basically have caused him to become lethargic.  Family is planning to stay at bedside 24/7 from today-I suspect that will probably be very beneficial as he seems to respond well to them.  We will discontinue telemetry-discontinue restraints/mittens-and minimize Haldol  use as much as possible.  Once he perks up a bit more-I suspect we will need to get SLP to reevaluate him-and place him on a diet.  We we will allow some time for clinical outcomes-but suspect if he does not improve with these measures-next up would be comfort care/hospice.  I have consulted palliative care.  Code status:   Code Status: Limited: Do not attempt resuscitation (DNR) -DNR-LIMITED -Do Not Intubate/DNI    DVT Prophylaxis: enoxaparin  (LOVENOX ) injection 40 mg Start: 08/19/24 1400   Family Communication: Daughter over the phone 08/23/2024   Disposition Plan: Status is: Inpatient Remains inpatient appropriate because: Severity of illness   Planned Discharge Destination:Home health   Diet: Diet Order             Diet NPO time specified Except for: Ice Chips, Sips with Meds  Diet effective now                     Antimicrobial agents: Anti-infectives (From admission, onward)    Start     Dose/Rate Route Frequency Ordered Stop   08/22/24 0800  cefTRIAXone  (ROCEPHIN ) 2 g in sodium chloride  0.9 % 100 mL IVPB        2 g 200 mL/hr over 30 Minutes Intravenous Daily 08/22/24 0705 08/25/24 0959   08/20/24 0500  vancomycin  (VANCOREADY) IVPB 750 mg/150 mL  Status:  Discontinued        750 mg 150 mL/hr over 60 Minutes Intravenous Every 24 hours 08/19/24 0922 08/19/24 1856   08/19/24 2000  doxycycline  (VIBRAMYCIN ) 100 mg in sodium chloride  0.9 % 250 mL IVPB  Status:  Discontinued         100 mg 125 mL/hr over 120 Minutes Intravenous Every 12 hours 08/19/24 1910 08/20/24 1155   08/19/24 1945  azithromycin  (ZITHROMAX ) 500 mg in sodium chloride  0.9 % 250 mL IVPB  Status:  Discontinued        500 mg 250 mL/hr over 60 Minutes Intravenous Every 24 hours 08/19/24 1856 08/19/24 1910   08/19/24 1200  ceFEPIme  (MAXIPIME ) 2 g in sodium chloride  0.9 % 100 mL IVPB  Status:  Discontinued        2 g 200 mL/hr over 30 Minutes Intravenous Every 12 hours 08/19/24 0408 08/19/24 1856   08/19/24 1000  metroNIDAZOLE  (FLAGYL ) IVPB 500 mg  Status:  Discontinued        500 mg 100 mL/hr over 60 Minutes Intravenous Every 12 hours 08/19/24 0359 08/19/24 1856   08/19/24 0408  vancomycin  variable dose per unstable renal function (pharmacist dosing)  Status:  Discontinued         Does not apply See admin instructions 08/19/24 0408 08/19/24 0922   08/18/24 2345  vancomycin  (VANCOREADY) IVPB 1500 mg/300 mL        1,500 mg 150 mL/hr over 120 Minutes Intravenous  Once 08/18/24 2331  08/19/24 0451   08/18/24 2345  ceFEPIme  (MAXIPIME ) 2 g in sodium chloride  0.9 % 100 mL IVPB        2 g 200 mL/hr over 30 Minutes Intravenous  Once 08/18/24 2332 08/19/24 0127   08/18/24 2330  aztreonam  (AZACTAM ) 2 g in sodium chloride  0.9 % 100 mL IVPB  Status:  Discontinued        2 g 200 mL/hr over 30 Minutes Intravenous  Once 08/18/24 2326 08/18/24 2331   08/18/24 2330  metroNIDAZOLE  (FLAGYL ) IVPB 500 mg        500 mg 100 mL/hr over 60 Minutes Intravenous  Once 08/18/24 2326 08/19/24 0230   08/18/24 2330  vancomycin  (VANCOCIN ) IVPB 1000 mg/200 mL premix  Status:  Discontinued        1,000 mg 200 mL/hr over 60 Minutes Intravenous  Once 08/18/24 2326 08/18/24 2331        MEDICATIONS: Scheduled Meds:  arformoterol   15 mcg Nebulization BID   budesonide  (PULMICORT ) nebulizer solution  0.25 mg Nebulization BID   carvedilol   3.125 mg Oral BID WC   enoxaparin  (LOVENOX ) injection  40 mg Subcutaneous Q24H   melatonin   5 mg Oral QHS   pantoprazole  (PROTONIX ) IV  40 mg Intravenous Q24H   QUEtiapine   25 mg Oral QHS   revefenacin   175 mcg Nebulization Daily   simvastatin   40 mg Oral QHS   sodium chloride  flush  10-40 mL Intracatheter Q12H   tamsulosin   0.4 mg Oral Daily   Continuous Infusions:  cefTRIAXone  (ROCEPHIN )  IV 2 g (08/23/24 0941)   dextrose  100 mL/hr at 08/23/24 0554   PRN Meds:.acetaminophen  **OR** acetaminophen , haloperidol  lactate, hydrALAZINE , ipratropium-albuterol , metoprolol  tartrate, prochlorperazine , senna-docusate, traZODone    I have personally reviewed following labs and imaging studies  LABORATORY DATA: CBC: Recent Labs  Lab 08/18/24 2016 08/18/24 2031 08/19/24 0502 08/20/24 0432 08/21/24 0349  WBC  --  15.8* 12.6* 13.6* 10.9*  NEUTROABS  --  10.8*  --   --   --   HGB 17.3* 15.2 11.2* 12.0* 11.3*  HCT 51.0 48.0 35.5* 37.2* 35.2*  MCV  --  92.7 93.2 90.7 91.9  PLT  --  278 216 235 240    Basic Metabolic Panel: Recent Labs  Lab 08/19/24 0502 08/20/24 0432 08/21/24 0349 08/22/24 0335 08/23/24 0316  NA 137 139 140 141 149*  K 3.3* 3.2* 3.2* 3.6 3.8  CL 101 99 100 102 106  CO2 27 23 17* 20* 24  GLUCOSE 91 76 104* 92 127*  BUN 14 11 15 18 21   CREATININE 1.16 0.97 0.91 1.07 1.10  CALCIUM  8.5* 8.8* 8.8* 8.8* 9.6  MG 1.8  --   --   --   --     GFR: Estimated Creatinine Clearance: 42.3 mL/min (by C-G formula based on SCr of 1.1 mg/dL).  Liver Function Tests: Recent Labs  Lab 08/18/24 2031  AST 38  ALT 19  ALKPHOS 110  BILITOT 0.8  PROT 8.6*  ALBUMIN  4.5   No results for input(s): LIPASE, AMYLASE in the last 168 hours. Recent Labs  Lab 08/19/24 0358  AMMONIA 26    Coagulation Profile: Recent Labs  Lab 08/18/24 2031  INR 1.0    Cardiac Enzymes: No results for input(s): CKTOTAL, CKMB, CKMBINDEX, TROPONINI in the last 168 hours.  BNP (last 3 results) No results for input(s): PROBNP in the last 8760 hours.  Lipid Profile: No  results for input(s): CHOL, HDL, LDLCALC, TRIG, CHOLHDL, LDLDIRECT in  the last 72 hours.  Thyroid  Function Tests: No results for input(s): TSH, T4TOTAL, FREET4, T3FREE, THYROIDAB in the last 72 hours.   Anemia Panel: No results for input(s): VITAMINB12, FOLATE, FERRITIN, TIBC, IRON, RETICCTPCT in the last 72 hours.   Urine analysis:    Component Value Date/Time   COLORURINE AMBER (A) 08/19/2024 0320   APPEARANCEUR HAZY (A) 08/19/2024 0320   APPEARANCEUR Clear 04/29/2013 1543   LABSPEC 1.032 (H) 08/19/2024 0320   LABSPEC 1.020 04/29/2013 1543   PHURINE 5.0 08/19/2024 0320   GLUCOSEU NEGATIVE 08/19/2024 0320   GLUCOSEU Negative 04/29/2013 1543   HGBUR MODERATE (A) 08/19/2024 0320   BILIRUBINUR SMALL (A) 08/19/2024 0320   BILIRUBINUR Negative 03/14/2022 1506   BILIRUBINUR Negative 04/29/2013 1543   KETONESUR 5 (A) 08/19/2024 0320   PROTEINUR >=300 (A) 08/19/2024 0320   UROBILINOGEN 0.2 03/14/2022 1506   UROBILINOGEN 0.2 03/08/2012 1624   NITRITE NEGATIVE 08/19/2024 0320   LEUKOCYTESUR NEGATIVE 08/19/2024 0320   LEUKOCYTESUR Negative 04/29/2013 1543    Sepsis Labs: Lactic Acid, Venous    Component Value Date/Time   LATICACIDVEN 1.9 08/19/2024 0246    MICROBIOLOGY: Recent Results (from the past 240 hours)  Resp panel by RT-PCR (RSV, Flu A&B, Covid) Anterior Nasal Swab     Status: None   Collection Time: 08/18/24 11:16 PM   Specimen: Anterior Nasal Swab  Result Value Ref Range Status   SARS Coronavirus 2 by RT PCR NEGATIVE NEGATIVE Final   Influenza A by PCR NEGATIVE NEGATIVE Final   Influenza B by PCR NEGATIVE NEGATIVE Final    Comment: (NOTE) The Xpert Xpress SARS-CoV-2/FLU/RSV plus assay is intended as an aid in the diagnosis of influenza from Nasopharyngeal swab specimens and should not be used as a sole basis for treatment. Nasal washings and aspirates are unacceptable for Xpert Xpress SARS-CoV-2/FLU/RSV testing.  Fact Sheet  for Patients: bloggercourse.com  Fact Sheet for Healthcare Providers: seriousbroker.it  This test is not yet approved or cleared by the [place]  FDA and has been authorized for detection and/or diagnosis of SARS-CoV-2 by FDA under an Emergency Use Authorization (EUA). This EUA will remain in effect (meaning this test can be used) for the duration of the COVID-19 declaration under Section 564(b)(1) of the Act, 21 U.S.C. section 360bbb-3(b)(1), unless the authorization is terminated or revoked.     Resp Syncytial Virus by PCR NEGATIVE NEGATIVE Final    Comment: (NOTE) Fact Sheet for Patients: bloggercourse.com  Fact Sheet for Healthcare Providers: seriousbroker.it  This test is not yet approved or cleared by the [place]  FDA and has been authorized for detection and/or diagnosis of SARS-CoV-2 by FDA under an Emergency Use Authorization (EUA). This EUA will remain in effect (meaning this test can be used) for the duration of the COVID-19 declaration under Section 564(b)(1) of the Act, 21 U.S.C. section 360bbb-3(b)(1), unless the authorization is terminated or revoked.  Performed at [facility], [address] [place], [place] [address]   Culture, blood (single)     Status: None (Preliminary result)   Collection Time: 08/18/24 11:26 PM   Specimen: BLOOD RIGHT ARM  Result Value Ref Range Status   Specimen Description BLOOD RIGHT ARM  Final   Special Requests   Final    BOTTLES DRAWN AEROBIC ONLY Blood Culture adequate volume   Culture   Final    NO GROWTH 4 DAYS Performed at [facility], [address], [place], [place] [address]    Report Status PENDING  Incomplete  Urine Culture (for pregnant, neutropenic or urologic patients or patients with an indwelling urinary catheter)     Status: None   Collection Time: 08/22/24  7:05 AM   Specimen: Urine, Clean  Catch  Result Value Ref Range Status   Specimen Description URINE, CLEAN CATCH  Final   Special Requests NONE  Final   Culture   Final    NO GROWTH Performed at [facility], [address], [place], [place] [address]    Report Status 08/23/2024 FINAL  Final    RADIOLOGY STUDIES/RESULTS: DG CHEST PORT 1 VIEW Result Date: 08/21/2024 EXAM: 1 VIEW(S) XRAY OF THE CHEST 08/21/2024 10:29:39 PM COMPARISON: 08/19/2024 CLINICAL HISTORY: Acute respiratory distress FINDINGS: LUNGS AND PLEURA: There are some interstitial opacities in the lung bases, left greater than right, which may be related to mild infection or edema. No pleural effusion. No pneumothorax. HEART AND MEDIASTINUM: Aortic atherosclerosis. No acute abnormality of the cardiac and mediastinal silhouettes. BONES AND SOFT TISSUES: No acute osseous abnormality. IMPRESSION: 1. Mild bibasilar interstitial opacities, left greater than right, which may reflect infection or edema. Electronically signed by: Greig Pique MD MD 08/21/2024 10:37 PM EST RP Workstation: [id number]     LOS: 4 days   Lavada Stank, MD  [facility]    To contact the attending provider between 7A-7P or the covering provider during after hours 7P-7A, please log into the web site [email or web address] and access using universal [place] password for that web site. If you do not have the password, please call the hospital operator.  08/23/2024, 11:03 AM    "

## 2024-08-23 NOTE — Progress Notes (Signed)
 Leon Estes [phone] [facility]?Hospital Liaison note:??   ???  Notified by care manager of patient/family request for [facility]iative services at home after discharge.????????   ????   Hospital liaison will follow patient for discharge disposition.?   ????   Please call with any hospice or outpatient palliative care related questions.???   ????   Thank you for the opportunity to participate in this patients care.   Nat [name], BSN, RN [facility] [phone]

## 2024-08-23 NOTE — Consult Note (Signed)
 " Palliative Medicine Inpatient Consult Note  Consulting Provider:  Raenelle Donalda HERO, MD   Reason for consult:   Palliative Care Consult Services Palliative Medicine Consult  Reason for Consult? goc   08/23/2024  HPI:  Per intake H&P -->   Leon Estes is a 89 year old male with a past medical history significant for paroxysmal atrial fibrillation, heart failure, dementia, stage III chronic kidney disease, COPD, coronary artery disease status post 2 stents, hypertension, BPH, and hypertension.  Leon Estes was hospitalized on January 7 in the setting of altered mental status.  Patient has received treatment for urinary infection.  Patient has had increased stress in the setting of his beloved dog dying which may have exacerbated his decline in mental faculties.  The palliative care team has been asked to support additional goals of care conversations moving forward during hospitalization.  Clinical Assessment/Goals of Care:  I have reviewed Dr. Dennise, Dr. Raenelle, Dr. Charlton, the notes from physical therapy, Occupational Therapy, and speech pathology, labs inclusive of BMP and CBC and imaging inclusive of CT head, CT cervical spine, chest x-ray, MRI brain, received report from bedside RN Lulu, assessed the patient who is lying in bed in NAD -pleasantly disoriented.    I met with patient's wife, Leon Estes, son Leon Estes, daughter Leon Estes, and daughter-in-law Leon Estes to further discuss diagnosis prognosis, GOC, EOL wishes, disposition and options.   I introduced Palliative Medicine as specialized medical care for people living with serious illness. It focuses on providing relief from the symptoms and stress of a serious illness. The goal is to improve quality of life for both the patient and the family.  Medical History Review and Understanding:  A review of Leon Estes's past medical history significant for dementia, heart failure, chronic kidney disease, COPD, paroxysmal atrial fibrillation, BPH,  hypertension, and coronary artery disease was completed.  Social History:  Leon Estes lives in [place], [place] .  He and his wife have been married for the past 73 years.  He has 4 children.  He formally worked in editor, commissioning.  He enjoys being independent and working outside with his lawnmower.  He had a beloved pug who recently passed away.  He is a man at the [employer].  Functional and Nutritional State:  Preceding hospitalization Leon Estes was living with his wife.  He has multiple family members who live out of close proximity.  He had been able to mobilize with a front wheel rollator walker, bathe himself, dress himself.  Done he was able to eat and drink well in the home with no deviations per his family.  Advance Directives:  A detailed discussion was had today regarding advanced directives.  Leon Estes is not have advanced directives though decisions are deferred to his wife, Leon Estes.  Code Status:  Concepts specific to code status, artifical feeding and hydration, continued IV antibiotics and rehospitalization was had.  The difference between a aggressive medical intervention path  and a palliative comfort care path for this patient at this time was had.   Leon Estes is established DO NOT RESUSCITATE DO NOT INTUBATE CODE STATUS.  Discussion:  A review of the circumstances leading to Leon Estes hospitalization inclusive of him having had a fall and being more altered thereafter.  Patient's family shared they were worried that he may have had a stroke.  We reviewed the diagnostic imaging studies which have been done to date and how nothing has been significant.  We reviewed the idea of polypharmacy contributing to his clinical condition more notably  the benzodiazepines that he had received which caused worsening sedation in the emergency department.  We discussed that his urine analysis was not significant however urine culture was pending and he has been treated with  intravenous antibiotics.  Reviewed patient's dehydration on admission in the maintenance IV fluids he has received which have likely improved his situation.    From the perspective of Leon Estes dementia and what to anticipate moving throughout the disease course, patient's family understands over time his functional abilities, cognitive abilities, and speech abilities will deteriorate.  At this point in time though they feel he has stabilized close to his baseline.    Patient's family note they have seen Leon Estes many times decline and rebound therefore they are hopeful this will be another one of those times.  We talked about this and how everyone unfortunately has reserved until they do not.  Patient's family share understanding.  The differences between outpatient palliative support and hospice support were described.  Patient's wife is very clear that at this point in time they are not ready for hospice but are welcome to outpatient palliative support.  Patient's wife and I discussed if Leon Estes were to continue to decline as opposed to improve then hospice would be more than a reasonable consideration.  Patient's family are hopeful to get Leon Estes home as early as tomorrow so that he can be in a familiar environment which would hopefully further help his cognitive state.  Discussed the importance of continued conversation with family and their  medical providers regarding overall plan of care and treatment options, ensuring decisions are within the context of the patients values and GOCs.  Decision Maker: Leon Estes,Shelby: Spouse, Emergency Contact: [phone] (Mobile)   SUMMARY OF RECOMMENDATIONS   DNAR/DNI  Continue current care allowing time for outcomes  Review of patient's acute on chronic disease burden  Discussion of palliative care versus hospice care --> at this time patient's wife would like to pursue outpatient palliative support  Appreciate the transitions of care team setting up  outpatient palliative support & providing family more information on home health services  The palliative medicine team will continue to follow along during hospitalization and as needed  Code Status/Advance Care Planning: DNAR/DNI   Symptom Management:   Palliative Prophylaxis:  Aspiration, Bowel Regimen, Delirium Protocol, Frequent Pain Assessment, Oral Care, Palliative Wound Care, and Turn Reposition  Additional Recommendations (Limitations, Scope, Preferences): Continue current care  Psycho-social/Spiritual:  Desire for further Chaplaincy support: Yes Additional Recommendations: Education on chronic disease burden-dementia specifically   Prognosis: Multiple chronic comorbidities place patient at a higher 97-month mortality risk  Discharge Planning: Discharge home once medically optimized with outpatient palliative support and home health.  Vitals:   08/23/24 0822 08/23/24 1140  BP:    Pulse: 100 67  Resp: 16   Temp:  99 F (37.2 C)  SpO2: 95%     Intake/Output Summary (Last 24 hours) at 08/23/2024 1543 Last data filed at 08/23/2024 0556 Gross per 24 hour  Intake 3 ml  Output 450 ml  Net -447 ml   Last Weight  Most recent update: 08/20/2024  6:31 AM    Weight  68.8 kg (151 lb 10.8 oz)             LABS: CBC:    Component Value Date/Time   WBC 10.9 (H) 08/21/2024 0349   HGB 11.3 (L) 08/21/2024 0349   HGB 9.4 (L) 09/12/2021 1611   HCT 35.2 (L) 08/21/2024 0349   HCT 28.7 (L) 09/12/2021  1611   PLT 240 08/21/2024 0349   PLT 234 09/12/2021 1611   MCV 91.9 08/21/2024 0349   MCV 81 09/12/2021 1611   MCV 87 04/29/2013 1543   NEUTROABS 10.8 (H) 08/18/2024 2031   LYMPHSABS 2.8 08/18/2024 2031   MONOABS 2.0 (H) 08/18/2024 2031   EOSABS 0.0 08/18/2024 2031   BASOSABS 0.1 08/18/2024 2031   Comprehensive Metabolic Panel:    Component Value Date/Time   NA 149 (H) 08/23/2024 0316   NA 135 (L) 04/29/2013 1543   K 3.8 08/23/2024 0316   K 3.3 (L) 04/29/2013 1543    CL 106 08/23/2024 0316   CL 102 04/29/2013 1543   CO2 24 08/23/2024 0316   CO2 27 04/29/2013 1543   BUN 21 08/23/2024 0316   BUN 17 04/29/2013 1543   CREATININE 1.10 08/23/2024 0316   CREATININE 2.47 (H) 09/09/2020 1343   GLUCOSE 127 (H) 08/23/2024 0316   GLUCOSE 120 (H) 04/29/2013 1543   CALCIUM  9.6 08/23/2024 0316   CALCIUM  9.8 04/29/2013 1543   AST 38 08/18/2024 2031   AST 30 04/29/2013 1543   ALT 19 08/18/2024 2031   ALT 24 04/29/2013 1543   ALKPHOS 110 08/18/2024 2031   ALKPHOS 79 04/29/2013 1543   BILITOT 0.8 08/18/2024 2031   BILITOT 0.4 04/29/2013 1543   PROT 8.6 (H) 08/18/2024 2031   PROT 7.9 04/29/2013 1543   ALBUMIN  4.5 08/18/2024 2031   ALBUMIN  3.8 04/29/2013 1543   Gen: Elderly chronically ill-appearing Caucasian male HEENT: moist mucous membranes CV: Regular rate and rhythm PULM: On room air breathing is even and nonlabored ABD: soft/nontender EXT: No edema Neuro: Aware of self is hard of hearing and has difficulty with vision  PPS: 40%   This conversation/these recommendations were discussed with patient primary care team, Dr. Dennise ______________________________________________________ Rosaline Becton [facility] Palliative Medicine Team Team Cell Phone: [phone] Please utilize secure chat with additional questions, if there is no response within 30 minutes please call the above phone number  I personally spent a total of 75 minutes in the care of the patient today including preparing to see the patient, getting/reviewing separately obtained history, performing a medically appropriate exam/evaluation, counseling and educating, placing orders, referring and communicating with other health care professionals, documenting clinical information in the EHR, and coordinating care.  "

## 2024-08-24 ENCOUNTER — Other Ambulatory Visit (HOSPITAL_COMMUNITY): Payer: Self-pay

## 2024-08-24 DIAGNOSIS — Z515 Encounter for palliative care: Secondary | ICD-10-CM | POA: Diagnosis not present

## 2024-08-24 DIAGNOSIS — Z7189 Other specified counseling: Secondary | ICD-10-CM | POA: Diagnosis not present

## 2024-08-24 LAB — CULTURE, BLOOD (SINGLE)
Culture: NO GROWTH
Special Requests: ADEQUATE

## 2024-08-24 LAB — BASIC METABOLIC PANEL WITH GFR
Anion gap: 12 (ref 5–15)
BUN: 17 mg/dL (ref 8–23)
CO2: 26 mmol/L (ref 22–32)
Calcium: 8.9 mg/dL (ref 8.9–10.3)
Chloride: 102 mmol/L (ref 98–111)
Creatinine, Ser: 0.83 mg/dL (ref 0.61–1.24)
GFR, Estimated: >60 mL/min
Glucose, Bld: 132 mg/dL — ABNORMAL HIGH (ref 70–99)
Potassium: 2.5 mmol/L — CL (ref 3.5–5.1)
Sodium: 140 mmol/L (ref 135–145)

## 2024-08-24 LAB — MAGNESIUM: Magnesium: 1.8 mg/dL (ref 1.7–2.4)

## 2024-08-24 LAB — PHOSPHORUS: Phosphorus: 1.5 mg/dL — ABNORMAL LOW (ref 2.5–4.6)

## 2024-08-24 MED ORDER — LOSARTAN POTASSIUM 25 MG PO TABS: 12.5000 mg | ORAL_TABLET | Freq: Every day | ORAL | Status: DC

## 2024-08-24 MED ORDER — MAGNESIUM SULFATE IN D5W 1-5 GM/100ML-% IV SOLN
1.0000 g | Freq: Once | INTRAVENOUS | Status: AC
  Administered 2024-08-24: 1 g via INTRAVENOUS
  Filled 2024-08-24: qty 100

## 2024-08-24 MED ORDER — FUROSEMIDE 20 MG PO TABS: 20.0000 mg | ORAL_TABLET | Freq: Every day | ORAL | Status: DC

## 2024-08-24 MED ORDER — POTASSIUM CHLORIDE 20 MEQ PO PACK
40.0000 meq | PACK | ORAL | Status: AC
  Administered 2024-08-24: 40 meq via ORAL
  Filled 2024-08-24: qty 2

## 2024-08-24 MED ORDER — PANTOPRAZOLE SODIUM 40 MG PO TBEC: 40.0000 mg | DELAYED_RELEASE_TABLET | Freq: Every day | ORAL | Status: DC

## 2024-08-24 MED ORDER — POTASSIUM PHOSPHATES 15 MMOLE/5ML IV SOLN
30.0000 mmol | Freq: Once | INTRAVENOUS | Status: AC
  Administered 2024-08-24: 30 mmol via INTRAVENOUS
  Filled 2024-08-24: qty 10

## 2024-08-24 MED ORDER — POTASSIUM CHLORIDE ER 10 MEQ PO TBCR: 10.0000 meq | EXTENDED_RELEASE_TABLET | Freq: Every day | ORAL | Status: DC

## 2024-08-24 MED ORDER — POTASSIUM CHLORIDE CRYS ER 10 MEQ PO TBCR
10.0000 meq | EXTENDED_RELEASE_TABLET | Freq: Every day | ORAL | 0 refills | Status: DC
  Filled 2024-08-24: qty 30, 30d supply, fill #0

## 2024-08-24 MED ORDER — PANTOPRAZOLE SODIUM 40 MG IV SOLR: 40.0000 mg | Freq: Every day | INTRAVENOUS | Status: DC

## 2024-08-24 MED ORDER — POTASSIUM CHLORIDE 10 MEQ/100ML IV SOLN
10.0000 meq | INTRAVENOUS | Status: AC
  Administered 2024-08-24 (×2): 10 meq via INTRAVENOUS
  Filled 2024-08-24 (×3): qty 100

## 2024-08-24 MED ORDER — FUROSEMIDE 20 MG PO TABS
20.0000 mg | ORAL_TABLET | Freq: Every day | ORAL | 0 refills | Status: DC
  Filled 2024-08-24: qty 30, 30d supply, fill #0

## 2024-08-24 MED ORDER — POTASSIUM CHLORIDE 20 MEQ PO PACK
20.0000 meq | PACK | Freq: Once | ORAL | Status: AC
  Administered 2024-08-24: 20 meq via ORAL
  Filled 2024-08-24: qty 1

## 2024-08-24 NOTE — TOC Transition Note (Signed)
 Transition of Care [facility] - Discharge Note   Patient Details  Name: Leon Estes MRN: [id number] Date of Birth: June 04, 1931  Transition of Care [facility]) CM/SW Contact:  Landry DELENA Senters, RN Phone Number: 08/24/2024, 3:03 PM   Clinical Narrative:     Patient will be discharging to home with family today, and OP palliative care f/u. [facility] will provide transportation home. CM did verify address with family prior to setting up transportation.   DME-hospital bed rec per therapy, wife and family refuse the need for this.   HH arranged through [facility], info on AVS.   No further needs identified by CM.   Final next level of care: Home w Home Health Services Barriers to Discharge: No Barriers Identified   Patient Goals and CMS Choice Patient states their goals for this hospitalization and ongoing recovery are:: to go home CMS Medicare.gov Compare Post Acute Care list provided to:: Patient Choice offered to / list presented to : Patient      Discharge Placement                       Discharge Plan and Services Additional resources added to the After Visit Summary for     Discharge Planning Services: CM Consult Post Acute Care Choice: Home Health          DME Arranged: N/A         HH Arranged: PT, RN HH Agency: [facility] Date [facility] Agency Contacted: 08/23/24 Time HH Agency Contacted: 1503 Representative spoke with at [facility] Agency: Darleene  Social Drivers of Health (SDOH) Interventions SDOH Screenings   Food Insecurity: No Food Insecurity (08/19/2024)  Housing: Low Risk (08/19/2024)  Transportation Needs: No Transportation Needs (08/19/2024)  Utilities: Not At Risk (08/19/2024)  Depression (PHQ2-9): Low Risk (04/09/2024)  Financial Resource Strain: Medium Risk (04/03/2024)   Received from [facility]  Social Connections: Moderately Isolated (08/19/2024)  Tobacco Use: High Risk (08/19/2024)     Readmission Risk Interventions     No data to display

## 2024-08-24 NOTE — Care Management Important Message (Signed)
 Important Message  Patient Details  Name: Leon Estes MRN: [id number] Date of Birth: March 27, 1931   Important Message Given:  Yes - Medicare IM     Claretta Deed 08/24/2024, 4:19 PM

## 2024-08-24 NOTE — Plan of Care (Signed)
  Problem: Fluid Volume: Goal: Hemodynamic stability will improve Outcome: Progressing   Problem: Clinical Measurements: Goal: Signs and symptoms of infection will decrease Outcome: Progressing   Problem: Respiratory: Goal: Ability to maintain adequate ventilation will improve Outcome: Progressing   

## 2024-08-24 NOTE — Progress Notes (Signed)
" ° °  Palliative Medicine Inpatient Follow Up Note   HPI: Leon Estes is a 89 year old male with a past medical history significant for paroxysmal atrial fibrillation, heart failure, dementia, stage III chronic kidney disease, COPD, coronary artery disease status post 2 stents, hypertension, BPH, and hypertension. Leon Estes was hospitalized on January 7 in the setting of altered mental status. Patient has received treatment for urinary infection. Patient has had increased stress in the setting of his beloved dog dying which may have exacerbated his decline in mental faculties. The palliative care team has been asked to support additional goals of care conversations moving forward during hospitalization.   Today's Discussion 08/24/2024  I reviewed the chart notes including nursing notes from [facility], progress notes from Dr. Dennise. I also reviewed vital signs which are stable, nursing flowsheets, medication administrations record, labs - K is low this morning at 2.5, Mg is 1.8, urine cx was NTD, no recent imaging since 1/9 to review.    No new nursing concerns identified on pre-rounds w/ Manisha.   Oral Intake %:  Not well documented I/O:  (-) Bowel Movements:  None since admission Mobility: Per PT - Max assistance per PT note on 1/10  Patient is resting calmly this morning in NAD. He is quite hard of hearing when spoken to. He is aware of self though otherwise disoriented.   Plan at this juncture is for Leon Estes to transition home with [facility] and OP Palliative support. The hope is for improvement of encephalopathy with transitioning to his familiar environment.   Questions and concerns addressed/Palliative Support Provided.   Objective Assessment: Vital Signs Vitals:   08/24/24 0755 08/24/24 0757  BP:    Pulse:    Resp:    Temp:    SpO2: 95% 95%    Intake/Output Summary (Last 24 hours) at 08/24/2024 1039 Last data filed at 08/23/2024 1815 Gross per 24 hour  Intake 100 ml   Output 400 ml  Net -300 ml   Last Weight  Most recent update: 08/20/2024  6:31 AM    Weight  68.8 kg (151 lb 10.8 oz)            Gen: Elderly chronically ill-appearing Caucasian male HEENT: moist mucous membranes CV: Regular rate and rhythm PULM: On room air breathing is even and nonlabored ABD: soft/nontender EXT: No edema Neuro: Aware of self is hard of hearing and has difficulty with vision  SUMMARY OF RECOMMENDATIONS   DNAR/DNI  Plan for transition home with [facility] and OP Palliative support today ______________________________________________________________________________________ Rosaline Becton [facility] Palliative Medicine Team Team Cell Phone: [phone] Please utilize secure chat with additional questions, if there is no response within 30 minutes please call the above phone number  I personally spent a total of 25 minutes in the care of the patient today including preparing to see the patient, performing a medically appropriate exam/evaluation, referring and communicating with other health care professionals, documenting clinical information in the EHR, and independently interpreting results.     "

## 2024-08-24 NOTE — Discharge Summary (Signed)
 "                                                                                                                                                                               Discharge summary note.  Leon Estes FMW:[id number] DOB: 12-01-1930 DOA: 08/18/2024  PCP: Bennett Reuben POUR, MD  Admit date: 08/18/2024  Discharge date: 08/24/2024  Admitted From: Home   Disposition:  Home   Recommendations for Outpatient Follow-up:   Follow up with PCP in 1-2 weeks  PCP Please obtain BMP/CBC, 2 view CXR in 1week,  (see Discharge instructions)   PCP Please follow up on the following pending results:    Home Health: PT, Pall Care   Equipment/Devices: see below  Consultations: Pall Care Discharge Condition: Guarded CODE STATUS: DNR Diet Recommendation: Dysphagia 1 diet with feeding assistance and aspiration precautions   Chief Complaint  Patient presents with   Altered Mental Status   Fall     Brief history of present illness from the day of admission and additional interim summary    89 y.o.  male with history of HTN, HLD, PAF not anticoagulated, CKD stage IIIa, COPD, dementia/cognitive issues-who presented with altered mental status.  Per family-dog died recently and has had significant decrease in oral intake since then.   Significant events: 1/7>> admit to [facility]   Significant studies: 1/6>> CT head: No acute intracranial normality 1/6>> CT spine: No fracture 1/6>> CXR: Mild peribronchial thickening/interstitial prominence-possible atypical infection/bronchitis. 1/7>> MRI brain: No acute findings 1/7>> TSH: Normal limits 1/7>> vitamin B12: 650 1/7>> RPR: Nonreactive 1/7>> ammonia: Normal 1/8>> EEG: No seizures.   Significant microbiology data: 1/6>> COVID/influenza/RSV PCR: Negative 1/6>> blood culture: No growth                                                                 Hospital Course   Acute metabolic encephalopathy Unclear etiology-suspected  dehydration-apparently dog died recently and patient has not eaten or drank since then.  Reviewed outpatient notes-he does have some cognitive issues-and has vision and hearing loss as well. Extensive workup negative so far Neck is supple and headache. This was thought to be metabolic encephalopathy worse due to being in the hospital setting, with family being around patient did much better however as soon as the left his mentation worsened, this happened on consecutive days multiple times.  Plan is now to take patient home to be in his familiar setting, hopefully his mentation and oral  intake will improve, if he declines family is open for hospice.  For now he is going home with palliative care follow-up.  I have expressed with patient's daughter that if his mentation does not improve his clinical prognosis is extremely poor and to focus on comfort at that point.  She understands that.   AKI  Due to dehydration hydrated and improved   Dehydration, hypernatremia, hypokalemia hypophosphatemia IV fluids, electrolytes replaced   COPD  Not in exacerbation Bronchodilators Stage scheduled bronchodilators   Chronic HFmrEF (EF 45% September 2025) Hold Lasix /losartan -unable to take any oral meds due to AMS Will resume oral meds vaginally once oral intake improves.   PAF  Follows with [facility], not anticoagulated due to falls  Continue Coreg      Dementia  Continue home regimen   Complete opacification of left maxillary sinus with soft tissue extending through the ostium-likely reflecting a polyp ED MD discussed with ENT (Dr. Earlie follow-up is recommended, per PCP can refer to ENT in 1 to 2 weeks if clinically appropriate at that time.      Discharge diagnosis     Principal Problem:   Acute encephalopathy Active Problems:   PAF (paroxysmal atrial fibrillation) (HCC)   Benign essential HTN   COPD (chronic obstructive pulmonary disease) (HCC)   AKI (acute kidney  injury)   Chronic heart failure with mildly reduced ejection fraction (HFmrEF) (HCC)   Vascular dementia without behavioral disturbance, psychotic disturbance, mood disturbance, or anxiety (HCC)   SIRS (systemic inflammatory response syndrome) (HCC)   Left maxillary sinus opacification    Discharge instructions    Discharge Instructions     Discharge instructions   Complete by: As directed    Follow with Primary MD Bennett Reuben POUR, MD in 7 days   Get CBC, CMP, Magnesium , 2 view Chest X ray -  checked next visit with your primary MD    Activity: As tolerated with Full fall precautions use walker/cane & assistance as needed  Disposition Home   Diet: Dysphagia 1 diet with feeding assistance and aspiration precautions.  Special Instructions: If you have smoked or chewed Tobacco  in the last 2 yrs please stop smoking, stop any regular Alcohol   and or any Recreational drug use.  On your next visit with your primary care physician please Get Medicines reviewed and adjusted.  Please request your Prim.MD to go over all Hospital Tests and Procedure/Radiological results at the follow up, please get all Hospital records sent to your Prim MD by signing hospital release before you go home.  If you experience worsening of your admission symptoms, develop shortness of breath, life threatening emergency, suicidal or homicidal thoughts you must seek medical attention immediately by calling 911 or calling your MD immediately  if symptoms less severe.  You Must read complete instructions/literature along with all the possible adverse reactions/side effects for all the Medicines you take and that have been prescribed to you. Take any new Medicines after you have completely understood and accpet all the possible adverse reactions/side effects.   Do not drive when taking Pain medications.  Do not take more than prescribed Pain, Sleep and Anxiety Medications  Wear Seat belts while driving.   Increase  activity slowly   Complete by: As directed    No wound care   Complete by: As directed        Discharge Medications   Allergies as of 08/24/2024       Reactions   Augmentin  [amoxicillin -pot Clavulanate] Rash  Bacitracin-polymyxin B Swelling, Rash   Benzalkonium Chloride    Benzalkonium chloride is a quaternary ammonium antiseptic and disinfectant with actions and uses similar to those of other cationic surfactants. It is also used as an antimicrobial preservative for pharmaceutical products.   Neosporin [neomycin-bacitracin Zn-polymyx] Rash   Sulfamethoxazole -trimethoprim  Rash   Terbinafine And Related Rash        Medication List     TAKE these medications    acetaminophen  500 MG tablet Commonly known as: TYLENOL  Take 1,000 mg by mouth every 6 (six) hours as needed for moderate pain (pain score 4-6) or headache. What changed: Another medication with the same name was removed. Continue taking this medication, and follow the directions you see here.   Aspirin  Low Dose 81 MG tablet Generic drug: aspirin  EC Take 1 tablet (81 mg total) by mouth daily. Swallow whole.   budesonide  0.5 MG/2ML nebulizer solution Commonly known as: PULMICORT  TAKE TWO MLS (0.5 MG TOTAL) BY NEBULIZATION IN THE MORNING AND AT BEDTIME   carvedilol  3.125 MG tablet Commonly known as: COREG  TAKE ONE TABLET BY MOUTH TWICE A DAY WITH A MEAL   furosemide  20 MG tablet Commonly known as: LASIX  Take 1 tablet (20 mg total) by mouth daily. Start taking on: August 27, 2024 What changed: These instructions start on August 27, 2024. If you are unsure what to do until then, ask your doctor or other care provider.   gabapentin  400 MG capsule Commonly known as: NEURONTIN  Take 1-2 capsules (400-800 mg total) by mouth at bedtime.   ipratropium-albuterol  0.5-2.5 (3) MG/3ML Soln Commonly known as: DUONEB TAKE THREE MLS (1 VIAL) BY NEBULIZATION THREE TO FOUR TIMES DAILY AS NEEDED   losartan  25 MG  tablet Commonly known as: COZAAR  Take 0.5 tablets (12.5 mg total) by mouth daily. Start taking on: August 27, 2024 What changed: These instructions start on August 27, 2024. If you are unsure what to do until then, ask your doctor or other care provider.   multivitamin with minerals Tabs tablet Take 1 tablet by mouth daily.   nitroGLYCERIN  0.4 MG SL tablet Commonly known as: NITROSTAT  Place 1 tablet (0.4 mg total) under the tongue every 5 (five) minutes as needed for chest pain.   ondansetron  4 MG disintegrating tablet Commonly known as: ZOFRAN -ODT Take 1 tablet (4 mg total) by mouth every 8 (eight) hours as needed for nausea or vomiting.   pantoprazole  40 MG tablet Commonly known as: PROTONIX  TAKE ONE TABLET (40 MG TOTAL) BY MOUTH DAILY.   potassium chloride  10 MEQ tablet Commonly known as: KLOR-CON  Take 1 tablet (10 mEq total) by mouth daily. Start taking on: August 26, 2024 What changed: These instructions start on August 26, 2024. If you are unsure what to do until then, ask your doctor or other care provider.   QUEtiapine  25 MG tablet Commonly known as: SEROQUEL  TAKE ONE TABLET BY MOUTH EVERY NIGHT AT BEDTIME   simvastatin  40 MG tablet Commonly known as: ZOCOR  TAKE ONE TABLET BY MOUTH EVERY NIGHT AT BEDTIME   sucralfate  1 g tablet Commonly known as: Carafate  Take 1 tablet (1 g total) by mouth 4 (four) times daily.   tamsulosin  0.4 MG Caps capsule Commonly known as: FLOMAX  TAKE ONE CAPSULE BY MOUTH ONCE DAILY   traMADol  50 MG tablet Commonly known as: ULTRAM  Take 1 tablet (50 mg total) by mouth 2 (two) times daily as needed.   triamcinolone  cream 0.1 % Commonly known as: KENALOG  APPLY ONE APPLICATION TOPICALLY TWO TIMES DAILY  AS NEEDED         Follow-up Information     Bennett Reuben POUR, MD. Schedule an appointment as soon as possible for a visit in 1 week(s).   Specialty: Family Medicine Contact information: [address] [place]  [address] [phone]                 Major procedures and Radiology Reports - PLEASE review detailed and final reports thoroughly  -       DG CHEST PORT 1 VIEW Result Date: 08/21/2024 EXAM: 1 VIEW(S) XRAY OF THE CHEST 08/21/2024 10:29:39 PM COMPARISON: 08/19/2024 CLINICAL HISTORY: Acute respiratory distress FINDINGS: LUNGS AND PLEURA: There are some interstitial opacities in the lung bases, left greater than right, which may be related to mild infection or edema. No pleural effusion. No pneumothorax. HEART AND MEDIASTINUM: Aortic atherosclerosis. No acute abnormality of the cardiac and mediastinal silhouettes. BONES AND SOFT TISSUES: No acute osseous abnormality. IMPRESSION: 1. Mild bibasilar interstitial opacities, left greater than right, which may reflect infection or edema. Electronically signed by: Greig Pique MD MD 08/21/2024 10:37 PM EST RP Workstation: [id number]   EEG adult Result Date: 08/20/2024 Shelton Arlin KIDD, MD     08/20/2024  5:27 PM Patient Name: Leon Estes MRN: [id number] Epilepsy Attending: Arlin KIDD Shelton Referring Physician/Provider: Raenelle Donalda CHRISTELLA, MD Date:  08/20/2024 Duration: 24.06 mins Patient history: 89yo M with ams. EEG to evaluate for seizure Level of alertness: Awake AEDs during EEG study: Ativan  Technical aspects: This EEG study was done with scalp electrodes positioned according to the 10-20 International system of electrode placement. Electrical activity was reviewed with band pass filter of 1-70Hz , sensitivity of 7 uV/mm, display speed of 20mm/sec with a 60Hz  notched filter applied as appropriate. EEG data were recorded continuously and digitally stored.  Video monitoring was available and reviewed as appropriate. Description: The posterior dominant rhythm consists of 9 Hz activity of moderate voltage (25-35 uV) seen predominantly in posterior head regions, symmetric and reactive to eye opening and eye closing. Hyperventilation and photic stimulation were  not performed.   IMPRESSION: This study is within normal limits. No seizures or epileptiform discharges were seen throughout the recording. A normal interictal EEG does not exclude the diagnosis of epilepsy. Arlin KIDD Shelton   DG CHEST PORT 1 VIEW Result Date: 08/19/2024 CLINICAL DATA:  Altered mental status EXAM: PORTABLE CHEST 1 VIEW COMPARISON:  Chest x-ray 08/19/2024, 08/02/2023 FINDINGS: No focal opacity, pleural effusion or pneumothorax. Stable cardiomediastinal silhouette with aortic atherosclerosis IMPRESSION: No active disease. Electronically Signed   By: Luke Bun M.D.   On: 08/19/2024 19:10   MR BRAIN WO CONTRAST Result Date: 08/19/2024 EXAM: MRI BRAIN WITHOUT CONTRAST 08/19/2024 01:26:03 PM TECHNIQUE: Multiplanar multisequence MRI of the head/brain was performed without the administration of intravenous contrast. COMPARISON: CT head 08/18/2024. CLINICAL HISTORY: Mental status change, unknown cause. FINDINGS: BRAIN AND VENTRICLES: No acute infarct. No intracranial hemorrhage. No mass. No midline shift. No hydrocephalus. T2 and FLAIR hyperintensity in the periventricular and subcortical white matter compatible with mild chronic microvascular ischemic changes. Mild generalized volume loss. The sella is unremarkable. Normal flow voids. ORBITS: Right lens replacement. SINUSES AND MASTOIDS: Mucosal thickening throughout the left maxillary sinus resulting in complete opacification. Additional moderate mucosal thickening in the left ethmoid sinus. There is soft tissue extending through the left maxillary sinus ostium which could reflect an antrochoanal polyp. Recommend correlation with direct visualization. Small right mastoid effusion. BONES AND SOFT TISSUES: Normal marrow signal.  No significant soft tissue abnormality. IMPRESSION: 1. No acute findings. 2. Mild chronic microvascular ischemic changes and mild generalized volume loss. 3. Complete left maxillary sinus opacification with soft tissue  extending through the left maxillary sinus ostium, which could reflect an antrochoanal polyp. Recommend direct visualization. Electronically signed by: Donnice Mania MD 08/19/2024 02:05 PM EST RP Workstation: [id number]   DG Chest Port 1 View Result Date: 08/19/2024 EXAM: 1 VIEW(S) XRAY OF THE CHEST 08/19/2024 12:32:00 AM COMPARISON: None available. CLINICAL HISTORY: ALOC FINDINGS: LUNGS AND PLEURA: Mild peribronchial thickening and interstitial prominence. No focal pulmonary opacity. No pleural effusion. No pneumothorax. HEART AND MEDIASTINUM: Aortic atherosclerosis. No acute abnormality of the cardiac and mediastinal silhouettes. BONES AND SOFT TISSUES: No acute osseous abnormality. IMPRESSION: 1. Mild peribronchial thickening and interstitial prominence could reflect bronchitis or atypypical infection. . Electronically signed by: Franky Crease MD 08/19/2024 12:34 AM EST RP Workstation: [id number]   CT HEAD WO CONTRAST Result Date: 08/18/2024 EXAM: CT HEAD WITHOUT CONTRAST 08/18/2024 09:56:07 PM TECHNIQUE: CT of the head was performed without the administration of intravenous contrast. Automated exposure control, iterative reconstruction, and/or weight based adjustment of the mA/kV was utilized to reduce the radiation dose to as low as reasonably achievable. COMPARISON: Comparison is made with head CT from 10/09/2021. CLINICAL HISTORY: Neuro deficit, acute, stroke suspected. FINDINGS: BRAIN AND VENTRICLES: No acute hemorrhage. No evidence of acute infarct. There is a small chronic lacunar infarct in the right parietal deep white matter. No old territorial infarct is seen. There is moderately developed cerebral atrophy, with small vessel disease and atrophic ventriculomegaly, relatively mild cerebellar volume loss. There are calcified plaques in the distal left vertebral artery and both siphons. No hyperdense vessel is seen. No extra-axial collection. No mass effect or midline shift. ORBITS: Old right-sided lens  replacement with otherwise negative orbits. SINUSES: There is a new polypoid mass which appears to extend from the left maxillary sinus cavity into the mid to lower left nasal passage and widens the left maxillary antrum. There is near complete opacification of the left maxillary sinus with fluid and mucosal disease. There is some demineralization noted in the left maxillary sinus walls, but no frank bone destruction. There is partial opacification of the left ethmoid air cells. Remaining visualized sinuses are clear. Left mastoids are clear. There is trace fluid in the right mastoid tip. SOFT TISSUES AND SKULL: No acute soft tissue abnormality. No skull fracture. IMPRESSION: 1. No acute intracranial CT findings. 2. 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 Benign and malignant etiologies are both possible. ENT consult recommended. 3. Small chronic lacunar infarct in the right parietal deep white matter. 4. Moderate cerebral atrophy with small vessel disease and atrophic ventriculomegaly, with relatively mild cerebellar volume loss. Electronically signed by: Francis Quam MD 08/18/2024 10:21 PM EST RP Workstation: [id number]   CT Cervical Spine Wo Contrast Result Date: 08/18/2024 EXAM: CT CERVICAL SPINE WITHOUT CONTRAST 08/18/2024 09:59:26 PM TECHNIQUE: CT of the cervical spine was performed without the administration of intravenous contrast. Multiplanar reformatted images are provided for review. Automated exposure control, iterative reconstruction, and/or weight based adjustment of the mA/kV was utilized to reduce the radiation dose to as low as reasonably achievable. COMPARISON: None available. CLINICAL HISTORY: Polytrauma,  blunt. FINDINGS: BONES  AND ALIGNMENT: No acute fracture or traumatic malalignment. 2 mm of anterolisthesis of C2 on C3, C6 on C7, and C7 on T1. DEGENERATIVE CHANGES: Diffuse degenerative disc and facet disease. SOFT TISSUES: No prevertebral soft tissue swelling. LUNGS: Biapical scarring and emphysema noted in the lung apices. IMPRESSION: 1. No acute cervical spine fracture or traumatic malalignment. Electronically signed by: Franky Crease MD 08/18/2024 10:18 PM EST RP Workstation: [id number]    Micro Results     Recent Results (from the past 240 hours)  Resp panel by RT-PCR (RSV, Flu A&B, Covid) Anterior Nasal Swab     Status: None   Collection Time: 08/18/24 11:16 PM   Specimen: Anterior Nasal Swab  Result Value Ref Range Status   SARS Coronavirus 2 by RT PCR NEGATIVE NEGATIVE Final   Influenza A by PCR NEGATIVE NEGATIVE Final   Influenza B by PCR NEGATIVE NEGATIVE Final    Comment: (NOTE) The Xpert Xpress SARS-CoV-2/FLU/RSV plus assay is intended as an aid in the diagnosis of influenza from Nasopharyngeal swab specimens and should not be used as a sole basis for treatment. Nasal washings and aspirates are unacceptable for Xpert Xpress SARS-CoV-2/FLU/RSV testing.  Fact Sheet for Patients: bloggercourse.com  Fact Sheet for Healthcare Providers: seriousbroker.it  This test is not yet approved or cleared by the [place]  FDA and has been authorized for detection and/or diagnosis of SARS-CoV-2 by FDA under an Emergency Use Authorization (EUA). This EUA will remain in effect (meaning this test can be used) for the duration of the COVID-19 declaration under Section 564(b)(1) of the Act, 21 U.S.C. section 360bbb-3(b)(1), unless the authorization is terminated or revoked.     Resp Syncytial Virus by PCR NEGATIVE NEGATIVE Final    Comment: (NOTE) Fact Sheet for Patients: bloggercourse.com  Fact Sheet for  Healthcare Providers: seriousbroker.it  This test is not yet approved or cleared by the [place]  FDA and has been authorized for detection and/or diagnosis of SARS-CoV-2 by FDA under an Emergency Use Authorization (EUA). This EUA will remain in effect (meaning this test can be used) for the duration of the COVID-19 declaration under Section 564(b)(1) of the Act, 21 U.S.C. section 360bbb-3(b)(1), unless the authorization is terminated or revoked.  Performed at [facility], [address] [place], [place] [address]   Culture, blood (single)     Status: None   Collection Time: 08/18/24 11:26 PM   Specimen: BLOOD RIGHT ARM  Result Value Ref Range Status   Specimen Description BLOOD RIGHT ARM  Final   Special Requests   Final    BOTTLES DRAWN AEROBIC ONLY Blood Culture adequate volume   Culture   Final    NO GROWTH 5 DAYS Performed at [facility], [address], [place], [place] [address]    Report Status 08/24/2024 FINAL  Final  Urine Culture (for pregnant, neutropenic or urologic patients or patients with an indwelling urinary catheter)     Status: None   Collection Time: 08/22/24  7:05 AM   Specimen: Urine, Clean Catch  Result Value Ref Range Status   Specimen Description URINE, CLEAN CATCH  Final   Special Requests NONE  Final   Culture   Final    NO GROWTH Performed at [facility], [address] [place], [place] [address]    Report Status 08/23/2024 FINAL  Final    Today   Subjective    Leon Estes today in bed appears to be in no distress thoroughly confused cannot  answer questions or follow commands appropriately   Objective   Blood pressure (!) 143/63, pulse 98, temperature 97.8 F (36.6 C), temperature source Oral, resp. rate 20, height 5' 8 (1.727 m), weight 68.8 kg, SpO2 95%.   Intake/Output Summary (Last 24 hours) at 08/24/2024 1022 Last data filed at 08/23/2024 1815 Gross per 24 hour  Intake 100 ml   Output 400 ml  Net -300 ml    Exam  Patient in bed, thoroughly confused, moving all 4 extremities by himself Slightly tachypneic Chest: Clear to auscultation CVS: S1-S2 regular Abdomen: Soft nontender       Data Review   Recent Labs  Lab 08/18/24 2016 08/18/24 2031 08/19/24 0502 08/20/24 0432 08/21/24 0349  WBC  --  15.8* 12.6* 13.6* 10.9*  HGB 17.3* 15.2 11.2* 12.0* 11.3*  HCT 51.0 48.0 35.5* 37.2* 35.2*  PLT  --  278 216 235 240  MCV  --  92.7 93.2 90.7 91.9  MCH  --  29.3 29.4 29.3 29.5  MCHC  --  31.7 31.5 32.3 32.1  RDW  --  15.0 15.0 14.9 15.0  LYMPHSABS  --  2.8  --   --   --   MONOABS  --  2.0*  --   --   --   EOSABS  --  0.0  --   --   --   BASOSABS  --  0.1  --   --   --     Recent Labs  Lab 08/18/24 2016 08/18/24 2017 08/18/24 2031 08/19/24 0246 08/19/24 0358 08/19/24 0500 08/19/24 0502 08/20/24 0432 08/21/24 0349 08/22/24 0335 08/23/24 0316 08/24/24 0415  NA  --   --  138  --   --   --  137 139 140 141 149* 140  K  --   --  3.3*  --   --   --  3.3* 3.2* 3.2* 3.6 3.8 2.5*  CL  --   --  95*  --   --   --  101 99 100 102 106 102  CO2   < >  --  29  --   --   --  27 23 17* 20* 24 26  ANIONGAP   < >  --  15  --   --   --  10 17* 22* 20* 19* 12  GLUCOSE  --   --  113*  --   --   --  91 76 104* 92 127* 132*  BUN  --   --  11  --   --   --  14 11 15 18 21 17   CREATININE  --   --  1.41*  --   --   --  1.16 0.97 0.91 1.07 1.10 0.83  AST  --   --  38  --   --   --   --   --   --   --   --   --   ALT  --   --  19  --   --   --   --   --   --   --   --   --   ALKPHOS  --   --  110  --   --   --   --   --   --   --   --   --   BILITOT  --   --  0.8  --   --   --   --   --   --   --   --   --  ALBUMIN   --   --  4.5  --   --   --   --   --   --   --   --   --   PROCALCITON  --   --   --   --   --   --  0.17  --   --   --   --   --   LATICACIDVEN  --  2.4*  --  1.9  --   --   --   --   --   --   --   --   INR  --   --  1.0  --   --   --   --   --   --    --   --   --   TSH  --   --   --   --   --  0.573  --  1.700  --   --   --   --   AMMONIA  --   --   --   --  26  --   --   --   --   --   --   --   MG  --   --   --   --   --   --  1.8  --   --   --   --  1.8  PHOS  --   --   --   --   --   --   --   --   --   --   --  1.5*  CALCIUM    < >  --  10.1  --   --   --  8.5* 8.8* 8.8* 8.8* 9.6 8.9   < > = values in this interval not displayed.    Total Time in preparing paper work, data evaluation and todays exam - 35 minutes  Signature  -    Lavada Stank M.D on 08/24/2024 at 10:22 AM   -  To page go to [email or web address]      "

## 2024-08-24 NOTE — Plan of Care (Signed)
  Problem: Fluid Volume: Goal: Hemodynamic stability will improve Outcome: Progressing   Problem: Clinical Measurements: Goal: Diagnostic test results will improve Outcome: Progressing Goal: Signs and symptoms of infection will decrease Outcome: Progressing   Problem: Respiratory: Goal: Ability to maintain adequate ventilation will improve Outcome: Progressing   Problem: Education: Goal: Knowledge of General Education information will improve Description: Including pain rating scale, medication(s)/side effects and non-pharmacologic comfort measures Outcome: Progressing   Problem: Health Behavior/Discharge Planning: Goal: Ability to manage health-related needs will improve Outcome: Progressing   Problem: Clinical Measurements: Goal: Ability to maintain clinical measurements within normal limits will improve Outcome: Progressing

## 2024-08-24 NOTE — Progress Notes (Signed)
 Speech Language Pathology Treatment: Dysphagia  Patient Details Name: Leon Estes MRN: [id number] DOB: 1931/05/04 Today's Date: 08/24/2024 Time: 8951-8941 SLP Time Calculation (min) (ACUTE ONLY): 10 min  Assessment / Plan / Recommendation Clinical Impression  Pt being discharged home with [facility] today. Oral inspection revealed residue that appeared to be lingual sloughing versus po and removed. Mild drowsiness but consumed applesauce with multiple swallows and straw sips thin with one subtle throat clear, wet vocal quality x 1. His most recent CXR revealed mild bibasilar interstitial opacities, left greater than right, which may reflect infection or edema.Given history of dementia with advanced age he is at risk for chronic pharyngeal dysphagia. Family not present today but educated yesterday with treating SLP. Recommend continue puree/thin and aspiration precautions. Palliative care following pt and can assist with decision making if status declines. If readmitted with pna may need instrumental if warranted given families goals of care.    HPI HPI: 89yo male admitted from home 08/19/23 with AMS after fall. MRI: no acute findings. CXR: no active disease. BSE 07/28/21: Dys3/thin, meds whole with liquid. Poor dentition. PMH: BPH, HTN, HLD, ischemic cardiomyopathy, PAF, CKD3a, CAD, COPD      SLP Plan  All goals met        Swallow Evaluation Recommendations   Recommendations: PO diet PO Diet Recommendation: Dysphagia 1 (Pureed);Thin liquids (Level 0) Liquid Administration via: Straw;Cup Medication Administration: Crushed with puree Supervision: Full supervision/cueing for swallowing strategies;Full assist for feeding Postural changes: Position pt fully upright for meals Oral care recommendations: Oral care BID (2x/day)     Recommendations                     Oral care BID   Frequent or constant Supervision/Assistance Dysphagia, unspecified (R13.10)     All  goals met     [name]  08/24/2024, 11:04 AM

## 2024-08-24 NOTE — Discharge Instructions (Signed)
 Follow with Primary MD Bennett Reuben POUR, MD in 7 days   Get CBC, CMP, Magnesium , 2 view Chest X ray -  checked next visit with your primary MD    Activity: As tolerated with Full fall precautions use walker/cane & assistance as needed  Disposition Home   Diet: Dysphagia 1 diet with feeding assistance and aspiration precautions.  Special Instructions: If you have smoked or chewed Tobacco  in the last 2 yrs please stop smoking, stop any regular Alcohol   and or any Recreational drug use.  On your next visit with your primary care physician please Get Medicines reviewed and adjusted.  Please request your Prim.MD to go over all Hospital Tests and Procedure/Radiological results at the follow up, please get all Hospital records sent to your Prim MD by signing hospital release before you go home.  If you experience worsening of your admission symptoms, develop shortness of breath, life threatening emergency, suicidal or homicidal thoughts you must seek medical attention immediately by calling 911 or calling your MD immediately  if symptoms less severe.  You Must read complete instructions/literature along with all the possible adverse reactions/side effects for all the Medicines you take and that have been prescribed to you. Take any new Medicines after you have completely understood and accpet all the possible adverse reactions/side effects.   Do not drive when taking Pain medications.  Do not take more than prescribed Pain, Sleep and Anxiety Medications  Wear Seat belts while driving.

## 2024-08-25 ENCOUNTER — Telehealth: Payer: Self-pay

## 2024-08-25 ENCOUNTER — Telehealth: Payer: Self-pay | Admitting: *Deleted

## 2024-08-25 NOTE — Telephone Encounter (Signed)
 We have not received a request for palliative care. Pt has not done a TOC. It is scheduled for next month. It looks like the nurse has tried to reach them for the transition of care follow-up. He will be scheduled for a hospital f/u through that call. A phone number was not provided for the [facility] nurse to let them know what is going on.

## 2024-08-25 NOTE — Telephone Encounter (Signed)
 Copied from CRM [id number]. Topic: Referral - Question >> Aug 25, 2024 11:41 AM Chasity T wrote: Reason for CRM: Therisa a nurse from [facility] is calling to advise that the palliative care referral has not been placed as of yet and wanted to follow up to make sure it gets done by appointment time of patient once his wife calls in to make the appointment.

## 2024-08-25 NOTE — Transitions of Care (Post Inpatient/ED Visit) (Signed)
" ° °  08/25/2024  Name: Leon Estes MRN: [id number] DOB: July 16, 1931  Today's TOC FU Call Status: Today's TOC FU Call Status:: Unsuccessful Call (1st Attempt) Unsuccessful Call (1st Attempt) Date: 08/25/24  Attempted to reach the patient regarding the most recent Inpatient/ED visit.  Follow Up Plan: Additional outreach attempts will be made to reach the patient to complete the Transitions of Care (Post Inpatient/ED visit) call.   Cathlean Headland BSN RN [facility] Health Care Management Coordinator [email or web address] Direct Dial: [phone]  Fax: [phone] Website: [email or web address]  "

## 2024-08-27 ENCOUNTER — Emergency Department (HOSPITAL_COMMUNITY)

## 2024-08-27 ENCOUNTER — Inpatient Hospital Stay (HOSPITAL_COMMUNITY)
Admission: EM | Admit: 2024-08-27 | Discharge: 2024-09-02 | DRG: 884 | Disposition: A | Discharge status: Hospice | Attending: Student | Admitting: Student

## 2024-08-27 ENCOUNTER — Other Ambulatory Visit: Payer: Self-pay

## 2024-08-27 DIAGNOSIS — Z955 Presence of coronary angioplasty implant and graft: Secondary | ICD-10-CM

## 2024-08-27 DIAGNOSIS — Z881 Allergy status to other antibiotic agents status: Secondary | ICD-10-CM

## 2024-08-27 DIAGNOSIS — E7800 Pure hypercholesterolemia, unspecified: Secondary | ICD-10-CM | POA: Diagnosis present

## 2024-08-27 DIAGNOSIS — Z66 Do not resuscitate: Secondary | ICD-10-CM | POA: Diagnosis present

## 2024-08-27 DIAGNOSIS — J9811 Atelectasis: Secondary | ICD-10-CM | POA: Diagnosis present

## 2024-08-27 DIAGNOSIS — E860 Dehydration: Secondary | ICD-10-CM | POA: Diagnosis present

## 2024-08-27 DIAGNOSIS — N400 Enlarged prostate without lower urinary tract symptoms: Secondary | ICD-10-CM | POA: Diagnosis present

## 2024-08-27 DIAGNOSIS — I480 Paroxysmal atrial fibrillation: Secondary | ICD-10-CM | POA: Diagnosis present

## 2024-08-27 DIAGNOSIS — R4189 Other symptoms and signs involving cognitive functions and awareness: Secondary | ICD-10-CM

## 2024-08-27 DIAGNOSIS — I10 Essential (primary) hypertension: Secondary | ICD-10-CM | POA: Diagnosis present

## 2024-08-27 DIAGNOSIS — Z79899 Other long term (current) drug therapy: Secondary | ICD-10-CM

## 2024-08-27 DIAGNOSIS — I5042 Chronic combined systolic (congestive) and diastolic (congestive) heart failure: Secondary | ICD-10-CM | POA: Diagnosis present

## 2024-08-27 DIAGNOSIS — I2510 Atherosclerotic heart disease of native coronary artery without angina pectoris: Secondary | ICD-10-CM | POA: Diagnosis present

## 2024-08-27 DIAGNOSIS — Z7189 Other specified counseling: Secondary | ICD-10-CM

## 2024-08-27 DIAGNOSIS — Z720 Tobacco use: Secondary | ICD-10-CM

## 2024-08-27 DIAGNOSIS — Z515 Encounter for palliative care: Secondary | ICD-10-CM

## 2024-08-27 DIAGNOSIS — Z1152 Encounter for screening for COVID-19: Secondary | ICD-10-CM

## 2024-08-27 DIAGNOSIS — E785 Hyperlipidemia, unspecified: Secondary | ICD-10-CM | POA: Diagnosis present

## 2024-08-27 DIAGNOSIS — F05 Delirium due to known physiological condition: Secondary | ICD-10-CM | POA: Diagnosis present

## 2024-08-27 DIAGNOSIS — R531 Weakness: Principal | ICD-10-CM

## 2024-08-27 DIAGNOSIS — L89311 Pressure ulcer of right buttock, stage 1: Secondary | ICD-10-CM | POA: Diagnosis present

## 2024-08-27 DIAGNOSIS — Z9181 History of falling: Secondary | ICD-10-CM

## 2024-08-27 DIAGNOSIS — H3530 Unspecified macular degeneration: Secondary | ICD-10-CM | POA: Diagnosis present

## 2024-08-27 DIAGNOSIS — Z880 Allergy status to penicillin: Secondary | ICD-10-CM

## 2024-08-27 DIAGNOSIS — N183 Chronic kidney disease, stage 3 unspecified: Secondary | ICD-10-CM | POA: Diagnosis present

## 2024-08-27 DIAGNOSIS — Z8249 Family history of ischemic heart disease and other diseases of the circulatory system: Secondary | ICD-10-CM

## 2024-08-27 DIAGNOSIS — N179 Acute kidney failure, unspecified: Secondary | ICD-10-CM | POA: Diagnosis not present

## 2024-08-27 DIAGNOSIS — R638 Other symptoms and signs concerning food and fluid intake: Secondary | ICD-10-CM

## 2024-08-27 DIAGNOSIS — Z6821 Body mass index (BMI) 21.0-21.9, adult: Secondary | ICD-10-CM

## 2024-08-27 DIAGNOSIS — K760 Fatty (change of) liver, not elsewhere classified: Secondary | ICD-10-CM | POA: Diagnosis present

## 2024-08-27 DIAGNOSIS — Z7982 Long term (current) use of aspirin: Secondary | ICD-10-CM

## 2024-08-27 DIAGNOSIS — Z882 Allergy status to sulfonamides status: Secondary | ICD-10-CM

## 2024-08-27 DIAGNOSIS — N281 Cyst of kidney, acquired: Secondary | ICD-10-CM | POA: Diagnosis present

## 2024-08-27 DIAGNOSIS — J4489 Other specified chronic obstructive pulmonary disease: Secondary | ICD-10-CM | POA: Diagnosis present

## 2024-08-27 DIAGNOSIS — R1310 Dysphagia, unspecified: Secondary | ICD-10-CM | POA: Diagnosis present

## 2024-08-27 DIAGNOSIS — J449 Chronic obstructive pulmonary disease, unspecified: Secondary | ICD-10-CM | POA: Diagnosis present

## 2024-08-27 DIAGNOSIS — Z7951 Long term (current) use of inhaled steroids: Secondary | ICD-10-CM

## 2024-08-27 DIAGNOSIS — G9341 Metabolic encephalopathy: Secondary | ICD-10-CM | POA: Diagnosis present

## 2024-08-27 DIAGNOSIS — Z781 Physical restraint status: Secondary | ICD-10-CM

## 2024-08-27 DIAGNOSIS — H9190 Unspecified hearing loss, unspecified ear: Secondary | ICD-10-CM | POA: Diagnosis present

## 2024-08-27 DIAGNOSIS — D72829 Elevated white blood cell count, unspecified: Secondary | ICD-10-CM | POA: Diagnosis present

## 2024-08-27 DIAGNOSIS — R410 Disorientation, unspecified: Secondary | ICD-10-CM

## 2024-08-27 DIAGNOSIS — Z833 Family history of diabetes mellitus: Secondary | ICD-10-CM

## 2024-08-27 DIAGNOSIS — R627 Adult failure to thrive: Secondary | ICD-10-CM | POA: Diagnosis present

## 2024-08-27 DIAGNOSIS — R911 Solitary pulmonary nodule: Secondary | ICD-10-CM | POA: Diagnosis present

## 2024-08-27 DIAGNOSIS — I700 Atherosclerosis of aorta: Secondary | ICD-10-CM | POA: Diagnosis present

## 2024-08-27 DIAGNOSIS — Z888 Allergy status to other drugs, medicaments and biological substances status: Secondary | ICD-10-CM

## 2024-08-27 DIAGNOSIS — I130 Hypertensive heart and chronic kidney disease with heart failure and stage 1 through stage 4 chronic kidney disease, or unspecified chronic kidney disease: Secondary | ICD-10-CM | POA: Diagnosis present

## 2024-08-27 DIAGNOSIS — M6282 Rhabdomyolysis: Secondary | ICD-10-CM | POA: Diagnosis present

## 2024-08-27 DIAGNOSIS — F03918 Unspecified dementia, unspecified severity, with other behavioral disturbance: Principal | ICD-10-CM | POA: Diagnosis present

## 2024-08-27 LAB — COMPREHENSIVE METABOLIC PANEL WITH GFR
ALT: 45 U/L — ABNORMAL HIGH (ref 0–44)
AST: 81 U/L — ABNORMAL HIGH (ref 15–41)
Albumin: 3.3 g/dL — ABNORMAL LOW (ref 3.5–5.0)
Alkaline Phosphatase: 71 U/L (ref 38–126)
Anion gap: 9 (ref 5–15)
BUN: 31 mg/dL — ABNORMAL HIGH (ref 8–23)
CO2: 28 mmol/L (ref 22–32)
Calcium: 9.4 mg/dL (ref 8.9–10.3)
Chloride: 104 mmol/L (ref 98–111)
Creatinine, Ser: 1.35 mg/dL — ABNORMAL HIGH (ref 0.61–1.24)
GFR, Estimated: 50 mL/min — ABNORMAL LOW
Glucose, Bld: 121 mg/dL — ABNORMAL HIGH (ref 70–99)
Potassium: 3.9 mmol/L (ref 3.5–5.1)
Sodium: 141 mmol/L (ref 135–145)
Total Bilirubin: 0.3 mg/dL (ref 0.0–1.2)
Total Protein: 6.7 g/dL (ref 6.5–8.1)

## 2024-08-27 LAB — RESP PANEL BY RT-PCR (RSV, FLU A&B, COVID)  RVPGX2
Influenza A by PCR: NEGATIVE
Influenza B by PCR: NEGATIVE
Resp Syncytial Virus by PCR: NEGATIVE
SARS Coronavirus 2 by RT PCR: NEGATIVE

## 2024-08-27 LAB — CBC
HCT: 37.1 % — ABNORMAL LOW (ref 39.0–52.0)
Hemoglobin: 11.7 g/dL — ABNORMAL LOW (ref 13.0–17.0)
MCH: 29.8 pg (ref 26.0–34.0)
MCHC: 31.5 g/dL (ref 30.0–36.0)
MCV: 94.4 fL (ref 80.0–100.0)
Platelets: 316 K/uL (ref 150–400)
RBC: 3.93 MIL/uL — ABNORMAL LOW (ref 4.22–5.81)
RDW: 15.5 % (ref 11.5–15.5)
WBC: 14.1 K/uL — ABNORMAL HIGH (ref 4.0–10.5)
nRBC: 0 % (ref 0.0–0.2)

## 2024-08-27 LAB — URINALYSIS, ROUTINE W REFLEX MICROSCOPIC
Bacteria, UA: NONE SEEN
Bilirubin Urine: NEGATIVE
Glucose, UA: NEGATIVE mg/dL
Ketones, ur: NEGATIVE mg/dL
Leukocytes,Ua: NEGATIVE
Nitrite: NEGATIVE
Protein, ur: 100 mg/dL — AB
Specific Gravity, Urine: 1.026 (ref 1.005–1.030)
pH: 5 (ref 5.0–8.0)

## 2024-08-27 LAB — CBG MONITORING, ED: Glucose-Capillary: 97 mg/dL (ref 70–99)

## 2024-08-27 LAB — I-STAT CG4 LACTIC ACID, ED: Lactic Acid, Venous: 1.6 mmol/L (ref 0.5–1.9)

## 2024-08-27 MED ORDER — IPRATROPIUM-ALBUTEROL 0.5-2.5 (3) MG/3ML IN SOLN
3.0000 mL | Freq: Four times a day (QID) | RESPIRATORY_TRACT | Status: DC | PRN
  Administered 2024-08-27: 3 mL via RESPIRATORY_TRACT
  Filled 2024-08-27: qty 3

## 2024-08-27 MED ORDER — LORAZEPAM 2 MG/ML IJ SOLN
0.5000 mg | Freq: Once | INTRAMUSCULAR | Status: AC
  Administered 2024-08-27: 0.5 mg via INTRAVENOUS
  Filled 2024-08-27: qty 1

## 2024-08-27 MED ORDER — ACETAMINOPHEN 325 MG PO TABS: 650.0000 mg | ORAL_TABLET | Freq: Four times a day (QID) | ORAL | Status: DC | PRN

## 2024-08-27 MED ORDER — HEPARIN SODIUM (PORCINE) 5000 UNIT/ML IJ SOLN
5000.0000 [IU] | Freq: Three times a day (TID) | INTRAMUSCULAR | Status: DC
  Administered 2024-08-27 – 2024-09-02 (×17): 5000 [IU] via SUBCUTANEOUS
  Filled 2024-08-27 (×17): qty 1

## 2024-08-27 MED ORDER — CARVEDILOL 3.125 MG PO TABS
3.1250 mg | ORAL_TABLET | Freq: Two times a day (BID) | ORAL | Status: DC
  Administered 2024-08-28: 3.125 mg via ORAL
  Filled 2024-08-27 (×2): qty 1

## 2024-08-27 MED ORDER — HYDRALAZINE HCL 20 MG/ML IJ SOLN
5.0000 mg | Freq: Four times a day (QID) | INTRAMUSCULAR | Status: DC | PRN
  Administered 2024-08-28 – 2024-08-31 (×2): 5 mg via INTRAVENOUS
  Filled 2024-08-27 (×2): qty 1

## 2024-08-27 MED ORDER — IOHEXOL 300 MG/ML  SOLN
100.0000 mL | Freq: Once | INTRAMUSCULAR | Status: AC | PRN
  Administered 2024-08-27: 100 mL via INTRAVENOUS

## 2024-08-27 MED ORDER — ACETAMINOPHEN 650 MG RE SUPP: 650.0000 mg | Freq: Four times a day (QID) | RECTAL | Status: DC | PRN

## 2024-08-27 MED ORDER — SODIUM CHLORIDE 0.9 % IV SOLN: INTRAVENOUS | Status: AC

## 2024-08-27 MED ORDER — SODIUM CHLORIDE 0.9 % IV SOLN: 250.0000 mL | INTRAVENOUS | Status: AC | PRN

## 2024-08-27 MED ORDER — QUETIAPINE FUMARATE 25 MG PO TABS
25.0000 mg | ORAL_TABLET | Freq: Every day | ORAL | Status: DC
  Administered 2024-08-27 – 2024-08-28 (×2): 25 mg via ORAL
  Filled 2024-08-27 (×2): qty 1

## 2024-08-27 MED ORDER — ONDANSETRON HCL 4 MG/2ML IJ SOLN: 4.0000 mg | Freq: Four times a day (QID) | INTRAMUSCULAR | Status: DC | PRN

## 2024-08-27 MED ORDER — POLYETHYLENE GLYCOL 3350 17 G PO PACK: 17.0000 g | PACK | Freq: Every day | ORAL | Status: DC | PRN

## 2024-08-27 MED ORDER — SODIUM CHLORIDE 0.9 % IV BOLUS
1000.0000 mL | Freq: Once | INTRAVENOUS | Status: AC
  Administered 2024-08-27: 1000 mL via INTRAVENOUS

## 2024-08-27 MED ORDER — ONDANSETRON HCL 4 MG PO TABS: 4.0000 mg | ORAL_TABLET | Freq: Four times a day (QID) | ORAL | Status: DC | PRN

## 2024-08-27 MED ORDER — BUDESONIDE 0.5 MG/2ML IN SUSP
0.5000 mg | Freq: Two times a day (BID) | RESPIRATORY_TRACT | Status: DC
  Administered 2024-08-27 – 2024-09-01 (×11): 0.5 mg via RESPIRATORY_TRACT
  Filled 2024-08-27 (×12): qty 2

## 2024-08-27 MED ORDER — TAMSULOSIN HCL 0.4 MG PO CAPS
0.4000 mg | ORAL_CAPSULE | Freq: Every day | ORAL | Status: DC
  Administered 2024-08-28 – 2024-09-02 (×6): 0.4 mg via ORAL
  Filled 2024-08-27 (×7): qty 1

## 2024-08-27 MED ORDER — PANTOPRAZOLE SODIUM 40 MG PO TBEC
40.0000 mg | DELAYED_RELEASE_TABLET | Freq: Every day | ORAL | Status: DC
  Administered 2024-08-28 – 2024-09-02 (×6): 40 mg via ORAL
  Filled 2024-08-27 (×7): qty 1

## 2024-08-27 MED ORDER — SODIUM CHLORIDE 0.9% FLUSH: 3.0000 mL | INTRAVENOUS | Status: DC | PRN

## 2024-08-27 MED ORDER — SODIUM CHLORIDE 0.9% FLUSH
3.0000 mL | Freq: Two times a day (BID) | INTRAVENOUS | Status: DC
  Administered 2024-08-27 – 2024-09-02 (×12): 3 mL via INTRAVENOUS

## 2024-08-27 NOTE — ED Provider Notes (Signed)
 " [place] EMERGENCY DEPARTMENT AT [facility] Provider Note   CSN: [id number] Arrival date & time: 08/27/24  9096     Patient presents with: Weakness   Leon Estes is a 89 y.o. male.   The history is provided by the EMS personnel and medical records. No language interpreter was used.  Weakness    89 year old male with history of hypertension, paroxysmal atrial fibrillation not on anticoagulation, HLD, CKD, COPD, dementia, recently admitted to the hospital on 08/18/2024 and was discharged on 08/24/2024 for altered mental status due to acute metabolic encephalopathy presenting to the ER today with concern of increasing weakness.  Patient was discharged from hospital 4 days ago.  He had a negative stroke workup.  However per family, patient has had increasing confusion and weakness.  He also has been coughing.  History is limited due to patient being hard of hearing and altered.  Per EMR, patient's dog died recently and patient has had significant decrease in oral intake since.  Prior to Admission medications  Medication Sig Start Date End Date Taking? Authorizing Provider  acetaminophen  (TYLENOL ) 500 MG tablet Take 1,000 mg by mouth every 6 (six) hours as needed for moderate pain (pain score 4-6) or headache.    [provider]  aspirin  81 MG EC tablet Take 1 tablet (81 mg total) by mouth daily. Swallow whole. 10/18/21   Vernon Ranks, MD  budesonide  (PULMICORT ) 0.5 MG/2ML nebulizer solution TAKE TWO MLS (0.5 MG TOTAL) BY NEBULIZATION IN THE MORNING AND AT BEDTIME 09/23/23   Jimmy Charlie FERNS, MD  carvedilol  (COREG ) 3.125 MG tablet TAKE ONE TABLET BY MOUTH TWICE A DAY WITH A MEAL 02/27/24   Nahser, Aleene PARAS, MD  furosemide  (LASIX ) 20 MG tablet Take 1 tablet (20 mg total) by mouth daily. 08/27/24 09/26/24  Singh, Prashant K, MD  gabapentin  (NEURONTIN ) 400 MG capsule Take 1-2 capsules (400-800 mg total) by mouth at bedtime. 04/29/24   Webb, Padonda B, FNP   ipratropium-albuterol  (DUONEB) 0.5-2.5 (3) MG/3ML SOLN TAKE THREE MLS (1 VIAL) BY NEBULIZATION THREE TO FOUR TIMES DAILY AS NEEDED 02/26/24   Jimmy Charlie FERNS, MD  losartan  (COZAAR ) 25 MG tablet Take 0.5 tablets (12.5 mg total) by mouth daily. 08/27/24   Singh, Prashant K, MD  Multiple Vitamin (MULTIVITAMIN WITH MINERALS) TABS tablet Take 1 tablet by mouth daily. 01/23/21   Will Almarie MATSU, MD  nitroGLYCERIN  (NITROSTAT ) 0.4 MG SL tablet Place 1 tablet (0.4 mg total) under the tongue every 5 (five) minutes as needed for chest pain. 10/10/23   Letvak, Richard I, MD  ondansetron  (ZOFRAN -ODT) 4 MG disintegrating tablet Take 1 tablet (4 mg total) by mouth every 8 (eight) hours as needed for nausea or vomiting. 04/09/24   Vincente Shivers, NP  pantoprazole  (PROTONIX ) 40 MG tablet TAKE ONE TABLET (40 MG TOTAL) BY MOUTH DAILY. 08/29/23   Jimmy Charlie FERNS, MD  potassium chloride  (KLOR-CON  M) 10 MEQ tablet Take 1 tablet (10 mEq total) by mouth daily. 08/26/24   Singh, Prashant K, MD  QUEtiapine  (SEROQUEL ) 25 MG tablet TAKE ONE TABLET BY MOUTH EVERY NIGHT AT BEDTIME 09/30/23   Jimmy Charlie FERNS, MD  simvastatin  (ZOCOR ) 40 MG tablet TAKE ONE TABLET BY MOUTH EVERY NIGHT AT BEDTIME 12/23/23   Jimmy Charlie FERNS, MD  sucralfate  (CARAFATE ) 1 g tablet Take 1 tablet (1 g total) by mouth 4 (four) times daily. 11/19/23 11/18/24  Jimmy Charlie FERNS, MD  tamsulosin  (FLOMAX ) 0.4 MG CAPS capsule TAKE ONE CAPSULE BY  MOUTH ONCE DAILY 08/29/23   Jimmy Charlie FERNS, MD  traMADol  (ULTRAM ) 50 MG tablet Take 1 tablet (50 mg total) by mouth 2 (two) times daily as needed. 07/16/24   Bowa, Nahosi K, MD  triamcinolone  cream (KENALOG ) 0.1 % APPLY ONE APPLICATION TOPICALLY TWO TIMES DAILY AS NEEDED 06/18/24   Webb, Padonda B, FNP    Allergies: Augmentin  [amoxicillin -pot clavulanate], Bacitracin-polymyxin b, Benzalkonium chloride, Neosporin [neomycin-bacitracin zn-polymyx], Sulfamethoxazole -trimethoprim , and Terbinafine and related    Review of  Systems  Unable to perform ROS: Mental status change  Neurological:  Positive for weakness.    Updated Vital Signs BP (!) 141/93 (BP Location: Right Arm)   Pulse 73   Temp 98.2 F (36.8 C) (Rectal)   Resp (!) 25   Ht 5' 8 (1.727 m)   Wt 68.5 kg   SpO2 98%   BMI 22.96 kg/m   Physical Exam Constitutional:      General: He is not in acute distress.    Appearance: He is well-developed.     Comments: Appears drowsy but arousable, patient is hard of hearing.  HENT:     Head: Atraumatic.     Mouth/Throat:     Comments: Mouth is dry, lips are dry Eyes:     Conjunctiva/sclera: Conjunctivae normal.  Cardiovascular:     Rate and Rhythm: Normal rate and regular rhythm.     Pulses: Normal pulses.     Heart sounds: Normal heart sounds.  Pulmonary:     Breath sounds: Rales (Faint crackles heard at lung bases) present.  Abdominal:     Tenderness: There is no abdominal tenderness.  Musculoskeletal:     Cervical back: Normal range of motion and neck supple.     Comments: Moving all 4 extremities with equal effort  Skin:    General: Skin is warm.     Findings: No rash.  Neurological:     Mental Status: He is alert. He is disoriented.     (all labs ordered are listed, but only abnormal results are displayed) Labs Reviewed  COMPREHENSIVE METABOLIC PANEL WITH GFR - Abnormal; Notable for the following components:      Result Value   Glucose, Bld 121 (*)    BUN 31 (*)    Creatinine, Ser 1.35 (*)    Albumin  3.3 (*)    AST 81 (*)    ALT 45 (*)    GFR, Estimated 50 (*)    All other components within normal limits  CBC - Abnormal; Notable for the following components:   WBC 14.1 (*)    RBC 3.93 (*)    Hemoglobin 11.7 (*)    HCT 37.1 (*)    All other components within normal limits  URINALYSIS, ROUTINE W REFLEX MICROSCOPIC - Abnormal; Notable for the following components:   Hgb urine dipstick SMALL (*)    Protein, ur 100 (*)    All other components within normal limits  RESP  PANEL BY RT-PCR (RSV, FLU A&B, COVID)  [id number]  CBG MONITORING, ED  I-STAT CG4 LACTIC ACID, ED  I-STAT CG4 LACTIC ACID, ED    EKG: None  Radiology: CT ABDOMEN PELVIS W CONTRAST Result Date: 08/27/2024 CLINICAL DATA:  Abdominal pain and increasing weakness. EXAM: CT ABDOMEN AND PELVIS WITH CONTRAST TECHNIQUE: Multidetector CT imaging of the abdomen and pelvis was performed using the standard protocol following bolus administration of intravenous contrast. RADIATION DOSE REDUCTION: This exam was performed according to the departmental dose-optimization program which includes automated exposure control, adjustment  of the mA and/or kV according to patient size and/or use of iterative reconstruction technique. CONTRAST:  OMNIPAQUE  IOHEXOL  300 MG/ML  SOLN COMPARISON:  02/16/2023 FINDINGS: Lower chest: Mild cardiomegaly. Calcified plaque over the right coronary artery as well as descending thoracic aorta. Lung bases demonstrate dependent atelectasis posterior left base. 9 x 9 mm nodule with possible linear associated density as this may represent scarring versus true nodule. This is larger compared to the prior study. Heterogeneous opacification over the right lower lobe which may be due to atelectasis although pneumonia is possible. Hepatobiliary: Gallbladder is contracted. Mild diffuse low-attenuation of the liver compared to the spleen compatible with a mild degree of steatosis. No focal liver mass. Biliary tree is unremarkable. Pancreas: Normal. Spleen: Normal. Adrenals/Urinary Tract: Adrenal glands are unremarkable. Kidneys are normal in size. Stable 3.1 cm cyst over the upper pole left kidney. No hydronephrosis or nephrolithiasis. Ureters and bladder are normal. Stomach/Bowel: Stomach is normal. Possible diverticula over the proximal duodenum unchanged. Small bowel is otherwise unremarkable. Prior appendectomy. Mild diverticulosis of the colon. Mild fecal retention over the rectosigmoid colon.  Vascular/Lymphatic: Moderate calcified plaque over the abdominal aorta. Aorta is normal caliber. Remaining vascular structures are unremarkable. No adenopathy. Reproductive: Moderate prostatic enlargement measuring 5.1 x 6.7 x 6.2 cm. Other: No free fluid or focal inflammatory change. Musculoskeletal: Degenerative change of the spine with multilevel disc disease over the lumbar spine from the L2-3 level to the L5-S1 level. Grade 1-2 anterolisthesis of L5 on S1 unchanged. IMPRESSION: 1. No acute findings in the abdomen/pelvis. 2. Heterogeneous opacification over the right lower lobe which may be due to atelectasis, although pneumonia is possible. 3. Mild diverticulosis of the colon. Mild fecal retention over the rectosigmoid colon. 4. Mild hepatic steatosis. 5. Stable 3.1 cm left renal cyst. 6. Moderate prostatic enlargement. 7. 9 x 9 mm nodule with possible linear associated density as this may represent scarring versus true nodule. This is larger compared to the prior study. Recommend noncontrast chest CT for complete evaluation of the lungs on elective basis. 8. Aortic atherosclerosis. Atherosclerotic coronary artery disease. Aortic Atherosclerosis (ICD10-I70.0). Electronically Signed   By: Toribio Agreste M.D.   On: 08/27/2024 12:58   DG Chest 2 View Result Date: 08/27/2024 CLINICAL DATA:  Increasing weakness. EXAM: CHEST - 2 VIEW COMPARISON:  08/21/2024 FINDINGS: Lungs are adequately inflated. Mild linear density over the bilateral lung bases likely atelectasis. No effusion. Cardiomediastinal silhouette and remainder the exam is unchanged. IMPRESSION: Mild bibasilar atelectasis. Electronically Signed   By: Toribio Agreste M.D.   On: 08/27/2024 10:20     Procedures   Medications Ordered in the ED  LORazepam  (ATIVAN ) injection 0.5 mg (0.5 mg Intravenous Given 08/27/24 1031)  sodium chloride  0.9 % bolus 1,000 mL (0 mLs Intravenous Stopped 08/27/24 1323)  iohexol  (OMNIPAQUE ) 300 MG/ML solution 100 mL (100 mLs  Intravenous Contrast Given 08/27/24 1226)                                    Medical Decision Making Amount and/or Complexity of Data Reviewed Labs: ordered. Radiology: ordered.  Risk Prescription drug management.   BP 112/63 (BP Location: Right Arm)   Pulse 87   Resp (!) 25   SpO2 97%   54:41 AM  89 year old male with history of hypertension, paroxysmal atrial fibrillation not on anticoagulation, HLD, CKD, COPD, dementia, recently admitted to the hospital on 08/18/2024 and was discharged  on 08/24/2024 for altered mental status due to acute metabolic encephalopathy presenting to the ER today with concern of increasing weakness.  Patient was discharged from hospital 4 days ago.  He had a negative stroke workup.  However per family, patient has had increasing confusion and weakness.  He also has been coughing.  History is limited due to patient being hard of hearing and altered.  Per EMR, patient's dog died recently and patient has had significant decrease in oral intake since.  On exam, elderly male, appears drowsy, hard of hearing, speech is difficult to understand.  He does follow command.  His strength are equal throughout all 4 extremities.  He has been minimal crackles on his lung bases.  -Labs ordered, independently viewed and interpreted by me.  Labs remarkable for Cr 1.35, IVF given.  Mild transaminitis with AST 81, ALT 45. WBC 14.1 -The patient was maintained on a cardiac monitor.  I personally viewed and interpreted the cardiac monitored which showed an underlying rhythm of: SR -Imaging independently viewed and interpreted by me and I agree with radiologist's interpretation.  Result remarkable for CXR with mild bibasilar atelectasis.  Abd/pelvis CT without acute finding -This patient presents to the ED for concern of confusion, this involves an extensive number of treatment options, and is a complaint that carries with it a high risk of complications and morbidity.  The differential  diagnosis includes delirium, dehydration, stroke, depression -Co morbidities that complicate the patient evaluation includes CAD, COPD, CKD, HTN -Treatment includes IVF -Reevaluation of the patient after these medicines showed that the patient stayed the same -PCP office notes or outside notes reviewed -Discussion with attending Dr. Armenta.  I have also consulted with [facility] provider who agrees to admit pt for AKI. -Escalation to admission/observation considered: patient to be admitted  I have reach out to family member over the phone and spoke with patient's wife as well as daughter.  They voiced concerns about his current mental status and ability to care for himself.  They do not feel like they can provide adequate care for him at home at this time.  He is not eating and drinking much.  Will request medicine for admission.      Final diagnoses:  Generalized weakness  AKI (acute kidney injury)    ED Discharge Orders     None          Nivia Colon, PA-C 08/27/24 1524    Armenta Canning, MD 08/31/24 1701  "

## 2024-08-27 NOTE — H&P (Signed)
 " [facility] History and Physical  Leon Estes FMW:[id number] DOB: Dec 05, 1930 DOA: 08/27/2024  Referring physician: ED  PCP: Bennett Reuben POUR, MD   Patient is coming from: Home  Chief Complaint: Generalized weakness  HPI:    89 year old male with past medical history of hypertension, paroxysmal atrial fibrillation not on anticoagulation, hyperlipidemia, CKD, COPD, dementia, with recent hospitalization and discharge on 08/24/2024 for altered mental status presented to the hospital with increasing confusion disorientation.  Workup at that time patient had stroke workup done which was negative and thought to have metabolic encephalopathy.  As per the family for the last 3 to 4 days patient had been having  increasing confusion and disorientation and decreased oral intake with generalized weakness.  Patient's spouse stated that she was unable to take care of him at home and was brought back to the hospital.  Has been set up some cough at home.  Patient's family at bedside reported some lethargy and feverish feeling at home as well.  There is no mention of nausea vomiting or diarrhea.  No mention of urinary issues.  No falls or loss of consciousness.  Overall poor historian.  In the ED, patient was slightly tachypneic.  Blood pressure was within normal range.  COVID influenza and RSV PCR was negative.  Urinalysis without white cells.  Chemistry showed creatinine of 1.3 from baseline creatinine around 0.8.  WBC was elevated at 14.1 with lactate of 1.6.  Chest x-ray showed some atelectasis.  Due to ongoing physical and mental decline with encephalopathy , generalized weakness , AKI and decreased oral intake, patient was considered for admission to the hospital for further evaluation and treatment.   Assessment and Plan Principal Problem:   AKI (acute kidney injury) Active Problems:   Chronic combined systolic (congestive) and diastolic (congestive) heart failure (HCC)   PAF (paroxysmal atrial  fibrillation) (HCC)   Benign essential HTN   COPD (chronic obstructive pulmonary disease) (HCC)   Hyperlipidemia  Confusion likely metabolic encephalopathy.   Continue  supportive care, delirium precautions.  Recent  workup in previous hospitalization for the same included  MRI of the brain was negative for acute findings.  Will continue with IV hydration.  Will continue to monitor patient's clinical course.  Avoid sedatives including gabapentin  for now.  Urinalysis was negative for infection.  Chest x-ray showed some atelectasis.  CT scan of the abdomen and pelvis without acute findings.  Acute kidney injury.   Baseline creatinine around 0.8.  Creatinine on presentation at 1.3.  Patient appears to be volume depleted.  Likely secondary to poor oral intake.  Continue with IV hydration, hold Lasix  and losartan  from home.  Avoid nephrotoxic medications.  Reassess volume status in AM.  Generalized weakness, failure to thrive.  Patient is frail.  Has been having ongoing decline.  Will likely need physical therapy evaluation and possibly need rehab placement.  Mild leukocytosis could be reactive.  Lactate was normal, no fever.  No obvious signs of infection.  Will monitor  History of hypertension Med rec pending.  Patient looks like he is on Coreg  and losartan  at home.  Will resume Coreg  since patient has atrial fibrillation as well.  Hyperlipidemia Med rec pending.  As per previous MAR patient is on simvastatin  at home.  COPD Continue Pulmicort  nebulizer DuoNebs from home.  Supplemental oxygen as needed.  Does not seem to be in distress.  Chest x-ray with some atelectasis.  History of dementia Continue supportive care  Paroxysmal atrial fibrillation Not  on anticoagulation at home.  On Coreg , will continue  Debility, generalized weakness, deconditioning.   PT OT evaluation.  Patient might need placement.  Patient lives with his wife at home and has not been able to take care of him recently.   TOC consultation.  DVT Prophylaxis: Heparin  subcu  Review of Systems:  All systems were reviewed and were negative unless otherwise mentioned in the HPI   Past Medical History:  Diagnosis Date   Asthma    BPH (benign prostatic hypertrophy)    Cataract    Chronic renal disease, stage III (HCC)    Chronic sinusitis    COPD (chronic obstructive pulmonary disease) (HCC)    Coronary artery disease    2 stents   Gall stones    Hearing loss    DOES NOT WEAR HEARING AIDS   High cholesterol    Hypertension    Lactic acid acidosis 10/12/2021   Macular degeneration    right eye   Rash 12/04/2021   Septic shock (HCC) 10/10/2021   Past Surgical History:  Procedure Laterality Date   APPENDECTOMY     CORONARY ANGIOPLASTY WITH STENT PLACEMENT  1998   2 stents   TRANSURETHRAL RESECTION OF PROSTATE      Social History:  reports that he quit smoking about 19 years ago. His smoking use included cigarettes. He has been exposed to tobacco smoke. His smokeless tobacco use includes chew. He reports that he does not drink alcohol  and does not use drugs.  Allergies[1]  Family History  Problem Relation Age of Onset   Diabetes Father    Heart disease Brother      Prior to Admission medications  Medication Sig Start Date End Date Taking? Authorizing Provider  acetaminophen  (TYLENOL ) 500 MG tablet Take 1,000 mg by mouth every 6 (six) hours as needed for moderate pain (pain score 4-6) or headache.    [provider]  aspirin  81 MG EC tablet Take 1 tablet (81 mg total) by mouth daily. Swallow whole. 10/18/21   Vernon Ranks, MD  budesonide  (PULMICORT ) 0.5 MG/2ML nebulizer solution TAKE TWO MLS (0.5 MG TOTAL) BY NEBULIZATION IN THE MORNING AND AT BEDTIME 09/23/23   Jimmy Charlie FERNS, MD  carvedilol  (COREG ) 3.125 MG tablet TAKE ONE TABLET BY MOUTH TWICE A DAY WITH A MEAL 02/27/24   Nahser, Aleene PARAS, MD  furosemide  (LASIX ) 20 MG tablet Take 1 tablet (20 mg total) by mouth daily. 08/27/24 09/26/24   Singh, Prashant K, MD  gabapentin  (NEURONTIN ) 400 MG capsule Take 1-2 capsules (400-800 mg total) by mouth at bedtime. 04/29/24   Webb, Padonda B, FNP  ipratropium-albuterol  (DUONEB) 0.5-2.5 (3) MG/3ML SOLN TAKE THREE MLS (1 VIAL) BY NEBULIZATION THREE TO FOUR TIMES DAILY AS NEEDED 02/26/24   Jimmy Charlie FERNS, MD  losartan  (COZAAR ) 25 MG tablet Take 0.5 tablets (12.5 mg total) by mouth daily. 08/27/24   Singh, Prashant K, MD  Multiple Vitamin (MULTIVITAMIN WITH MINERALS) TABS tablet Take 1 tablet by mouth daily. 01/23/21   Will Almarie MATSU, MD  nitroGLYCERIN  (NITROSTAT ) 0.4 MG SL tablet Place 1 tablet (0.4 mg total) under the tongue every 5 (five) minutes as needed for chest pain. 10/10/23   Letvak, Richard I, MD  ondansetron  (ZOFRAN -ODT) 4 MG disintegrating tablet Take 1 tablet (4 mg total) by mouth every 8 (eight) hours as needed for nausea or vomiting. 04/09/24   Vincente Shivers, NP  pantoprazole  (PROTONIX ) 40 MG tablet TAKE ONE TABLET (40 MG TOTAL) BY MOUTH DAILY. 08/29/23  Jimmy Charlie FERNS, MD  potassium chloride  (KLOR-CON  M) 10 MEQ tablet Take 1 tablet (10 mEq total) by mouth daily. 08/26/24   Singh, Prashant K, MD  QUEtiapine  (SEROQUEL ) 25 MG tablet TAKE ONE TABLET BY MOUTH EVERY NIGHT AT BEDTIME 09/30/23   Jimmy Charlie FERNS, MD  simvastatin  (ZOCOR ) 40 MG tablet TAKE ONE TABLET BY MOUTH EVERY NIGHT AT BEDTIME 12/23/23   Jimmy Charlie FERNS, MD  sucralfate  (CARAFATE ) 1 g tablet Take 1 tablet (1 g total) by mouth 4 (four) times daily. 11/19/23 11/18/24  Jimmy Charlie FERNS, MD  tamsulosin  (FLOMAX ) 0.4 MG CAPS capsule TAKE ONE CAPSULE BY MOUTH ONCE DAILY 08/29/23   Jimmy Charlie FERNS, MD  traMADol  (ULTRAM ) 50 MG tablet Take 1 tablet (50 mg total) by mouth 2 (two) times daily as needed. 07/16/24   Bowa, Nahosi K, MD  triamcinolone  cream (KENALOG ) 0.1 % APPLY ONE APPLICATION TOPICALLY TWO TIMES DAILY AS NEEDED 06/18/24   Douglass Kenney NOVAK, FNP    Physical Exam:  Vitals:   08/27/24 1200 08/27/24 1345 08/27/24 1445  08/27/24 1516  BP: 117/77 (!) 141/93 (!) 158/92 134/85  Pulse: 76 73  75  Resp: (!) 28 (!) 25 (!) 27 (!) 28  Temp:    98 F (36.7 C)  TempSrc:  Oral  Axillary  SpO2: 97% 98%    Weight:      Height:       Wt Readings from Last 3 Encounters:  08/27/24 68.5 kg  08/20/24 68.8 kg  04/09/24 67.1 kg   Body mass index is 22.96 kg/m.  General:  Average built, not in obvious distress elderly male, Communicative but disoriented and confused.  Appears weak and deconditioned, hard of hearing HENT: Normocephalic, No scleral pallor or icterus noted. Oral mucosa is dry Chest: Diminished breath sounds bilaterally. CVS: S1 &S2 heard. No murmur.  Regular rate and rhythm. Abdomen: Soft, nontender, nondistended.  Bowel sounds are heard. No abdominal mass palpated Extremities: No cyanosis, clubbing or edema.  Peripheral pulses are palpable. Psych: Alert, awake and disoriented, CNS: Moving all extremities, generalized weakness Skin: Warm and dry.  No rashes noted.  Labs on Admission:   CBC: Recent Labs  Lab 08/21/24 0349 08/27/24 0950  WBC 10.9* 14.1*  HGB 11.3* 11.7*  HCT 35.2* 37.1*  MCV 91.9 94.4  PLT 240 316    Basic Metabolic Panel: Recent Labs  Lab 08/21/24 0349 08/22/24 0335 08/23/24 0316 08/24/24 0415 08/27/24 0950  NA 140 141 149* 140 141  K 3.2* 3.6 3.8 2.5* 3.9  CL 100 102 106 102 104  CO2 17* 20* 24 26 28   GLUCOSE 104* 92 127* 132* 121*  BUN 15 18 21 17  31*  CREATININE 0.91 1.07 1.10 0.83 1.35*  CALCIUM  8.8* 8.8* 9.6 8.9 9.4  MG  --   --   --  1.8  --   PHOS  --   --   --  1.5*  --     Liver Function Tests: Recent Labs  Lab 08/27/24 0950  AST 81*  ALT 45*  ALKPHOS 71  BILITOT 0.3  PROT 6.7  ALBUMIN  3.3*   No results for input(s): LIPASE, AMYLASE in the last 168 hours. No results for input(s): AMMONIA in the last 168 hours.  Cardiac Enzymes: No results for input(s): CKTOTAL, CKMB, CKMBINDEX, TROPONINI in the last 168 hours.  BNP (last  3 results) No results for input(s): BNP in the last 8760 hours.  ProBNP (last 3 results) No results for input(s):  PROBNP in the last 8760 hours.  CBG: Recent Labs  Lab 08/27/24 1030  GLUCAP 97     Urinalysis    Component Value Date/Time   COLORURINE YELLOW 08/27/2024 1054   APPEARANCEUR CLEAR 08/27/2024 1054   APPEARANCEUR Clear 04/29/2013 1543   LABSPEC 1.026 08/27/2024 1054   LABSPEC 1.020 04/29/2013 1543   PHURINE 5.0 08/27/2024 1054   GLUCOSEU NEGATIVE 08/27/2024 1054   GLUCOSEU Negative 04/29/2013 1543   HGBUR SMALL (A) 08/27/2024 1054   BILIRUBINUR NEGATIVE 08/27/2024 1054   BILIRUBINUR Negative 03/14/2022 1506   BILIRUBINUR Negative 04/29/2013 1543   KETONESUR NEGATIVE 08/27/2024 1054   PROTEINUR 100 (A) 08/27/2024 1054   UROBILINOGEN 0.2 03/14/2022 1506   UROBILINOGEN 0.2 03/08/2012 1624   NITRITE NEGATIVE 08/27/2024 1054   LEUKOCYTESUR NEGATIVE 08/27/2024 1054   LEUKOCYTESUR Negative 04/29/2013 1543         Radiological Exams on Admission: CT ABDOMEN PELVIS W CONTRAST Result Date: 08/27/2024 CLINICAL DATA:  Abdominal pain and increasing weakness. EXAM: CT ABDOMEN AND PELVIS WITH CONTRAST TECHNIQUE: Multidetector CT imaging of the abdomen and pelvis was performed using the standard protocol following bolus administration of intravenous contrast. RADIATION DOSE REDUCTION: This exam was performed according to the departmental dose-optimization program which includes automated exposure control, adjustment of the mA and/or kV according to patient size and/or use of iterative reconstruction technique. CONTRAST:  OMNIPAQUE  IOHEXOL  300 MG/ML  SOLN COMPARISON:  02/16/2023 FINDINGS: Lower chest: Mild cardiomegaly. Calcified plaque over the right coronary artery as well as descending thoracic aorta. Lung bases demonstrate dependent atelectasis posterior left base. 9 x 9 mm nodule with possible linear associated density as this may represent scarring versus true  nodule. This is larger compared to the prior study. Heterogeneous opacification over the right lower lobe which may be due to atelectasis although pneumonia is possible. Hepatobiliary: Gallbladder is contracted. Mild diffuse low-attenuation of the liver compared to the spleen compatible with a mild degree of steatosis. No focal liver mass. Biliary tree is unremarkable. Pancreas: Normal. Spleen: Normal. Adrenals/Urinary Tract: Adrenal glands are unremarkable. Kidneys are normal in size. Stable 3.1 cm cyst over the upper pole left kidney. No hydronephrosis or nephrolithiasis. Ureters and bladder are normal. Stomach/Bowel: Stomach is normal. Possible diverticula over the proximal duodenum unchanged. Small bowel is otherwise unremarkable. Prior appendectomy. Mild diverticulosis of the colon. Mild fecal retention over the rectosigmoid colon. Vascular/Lymphatic: Moderate calcified plaque over the abdominal aorta. Aorta is normal caliber. Remaining vascular structures are unremarkable. No adenopathy. Reproductive: Moderate prostatic enlargement measuring 5.1 x 6.7 x 6.2 cm. Other: No free fluid or focal inflammatory change. Musculoskeletal: Degenerative change of the spine with multilevel disc disease over the lumbar spine from the L2-3 level to the L5-S1 level. Grade 1-2 anterolisthesis of L5 on S1 unchanged. IMPRESSION: 1. No acute findings in the abdomen/pelvis. 2. Heterogeneous opacification over the right lower lobe which may be due to atelectasis, although pneumonia is possible. 3. Mild diverticulosis of the colon. Mild fecal retention over the rectosigmoid colon. 4. Mild hepatic steatosis. 5. Stable 3.1 cm left renal cyst. 6. Moderate prostatic enlargement. 7. 9 x 9 mm nodule with possible linear associated density as this may represent scarring versus true nodule. This is larger compared to the prior study. Recommend noncontrast chest CT for complete evaluation of the lungs on elective basis. 8. Aortic  atherosclerosis. Atherosclerotic coronary artery disease. Aortic Atherosclerosis (ICD10-I70.0). Electronically Signed   By: Toribio Agreste M.D.   On: 08/27/2024 12:58   DG Chest  2 View Result Date: 08/27/2024 CLINICAL DATA:  Increasing weakness. EXAM: CHEST - 2 VIEW COMPARISON:  08/21/2024 FINDINGS: Lungs are adequately inflated. Mild linear density over the bilateral lung bases likely atelectasis. No effusion. Cardiomediastinal silhouette and remainder the exam is unchanged. IMPRESSION: Mild bibasilar atelectasis. Electronically Signed   By: Toribio Agreste M.D.   On: 08/27/2024 10:20    EKG: Pending today   Consultant: None  Code Status: DNR.  Discussed with family about goals of care.  Microbiology respiratory viral panel  Antibiotics: None  Family Communication:  Patients' condition and plan of care including tests being ordered have been discussed with the patient and spoke with the patient's daughter and granddaughter at bedside.  Who indicate understanding and agree with the plan.  Status is: Observation   Severity of Illness: The appropriate patient status for this patient is OBSERVATION. Observation status is judged to be reasonable and necessary in order to provide the required intensity of service to ensure the patient's safety. The patient's presenting symptoms, physical exam findings, and initial radiographic and laboratory data in the context of their medical condition is felt to place them at decreased risk for further clinical deterioration. Furthermore, it is anticipated that the patient will be medically stable for discharge from the hospital within 2 midnights of admission.   Signed, Vernal Alstrom, MD [facility] 08/27/2024         [1]  Allergies Allergen Reactions   Augmentin  [Amoxicillin -Pot Clavulanate] Rash   Bacitracin-Polymyxin B Swelling and Rash   Benzalkonium Chloride     Benzalkonium chloride is a quaternary ammonium antiseptic and  disinfectant with actions and uses similar to those of other cationic surfactants. It is also used as an antimicrobial preservative for pharmaceutical products.   Neosporin [Neomycin-Bacitracin Zn-Polymyx] Rash   Sulfamethoxazole -Trimethoprim  Rash   Terbinafine And Related Rash   "

## 2024-08-27 NOTE — Hospital Course (Addendum)
 SABRA

## 2024-08-27 NOTE — Progress Notes (Signed)
 [facility] Liaison Note:  This patient is currently enrolled in [facility] outpatient-based palliative care.  Please call for any outpatient based palliative care related questions or concerns.  Thank you,  Greig Basket, BSN, RN [facility] Liaison [phone]

## 2024-08-27 NOTE — ED Triage Notes (Signed)
 Pt BIBA from home for increasing weakness. Pt was dc [facility] ago, worked up for stroke since pt has been 'leaning to the left'. Family reports pt has not had any sudden new symptoms but that he is increasingly weak. EMS reports concern for uti. Pt has productive cough and is more confused than usual.

## 2024-08-28 DIAGNOSIS — K760 Fatty (change of) liver, not elsewhere classified: Secondary | ICD-10-CM | POA: Diagnosis present

## 2024-08-28 DIAGNOSIS — G9341 Metabolic encephalopathy: Secondary | ICD-10-CM | POA: Diagnosis present

## 2024-08-28 DIAGNOSIS — J449 Chronic obstructive pulmonary disease, unspecified: Secondary | ICD-10-CM | POA: Diagnosis not present

## 2024-08-28 DIAGNOSIS — N400 Enlarged prostate without lower urinary tract symptoms: Secondary | ICD-10-CM | POA: Diagnosis present

## 2024-08-28 DIAGNOSIS — I130 Hypertensive heart and chronic kidney disease with heart failure and stage 1 through stage 4 chronic kidney disease, or unspecified chronic kidney disease: Secondary | ICD-10-CM | POA: Diagnosis present

## 2024-08-28 DIAGNOSIS — I5042 Chronic combined systolic (congestive) and diastolic (congestive) heart failure: Secondary | ICD-10-CM

## 2024-08-28 DIAGNOSIS — N179 Acute kidney failure, unspecified: Secondary | ICD-10-CM | POA: Diagnosis present

## 2024-08-28 DIAGNOSIS — Z7951 Long term (current) use of inhaled steroids: Secondary | ICD-10-CM | POA: Diagnosis not present

## 2024-08-28 DIAGNOSIS — L89311 Pressure ulcer of right buttock, stage 1: Secondary | ICD-10-CM | POA: Diagnosis present

## 2024-08-28 DIAGNOSIS — E7800 Pure hypercholesterolemia, unspecified: Secondary | ICD-10-CM

## 2024-08-28 DIAGNOSIS — R410 Disorientation, unspecified: Secondary | ICD-10-CM | POA: Diagnosis not present

## 2024-08-28 DIAGNOSIS — I480 Paroxysmal atrial fibrillation: Secondary | ICD-10-CM | POA: Diagnosis present

## 2024-08-28 DIAGNOSIS — N183 Chronic kidney disease, stage 3 unspecified: Secondary | ICD-10-CM | POA: Diagnosis present

## 2024-08-28 DIAGNOSIS — J9811 Atelectasis: Secondary | ICD-10-CM | POA: Diagnosis present

## 2024-08-28 DIAGNOSIS — R531 Weakness: Secondary | ICD-10-CM | POA: Diagnosis present

## 2024-08-28 DIAGNOSIS — I10 Essential (primary) hypertension: Secondary | ICD-10-CM | POA: Diagnosis not present

## 2024-08-28 DIAGNOSIS — Z1152 Encounter for screening for COVID-19: Secondary | ICD-10-CM | POA: Diagnosis not present

## 2024-08-28 DIAGNOSIS — R4189 Other symptoms and signs involving cognitive functions and awareness: Secondary | ICD-10-CM | POA: Diagnosis not present

## 2024-08-28 DIAGNOSIS — F05 Delirium due to known physiological condition: Secondary | ICD-10-CM | POA: Diagnosis present

## 2024-08-28 DIAGNOSIS — Z515 Encounter for palliative care: Secondary | ICD-10-CM | POA: Diagnosis not present

## 2024-08-28 DIAGNOSIS — Z7189 Other specified counseling: Secondary | ICD-10-CM | POA: Diagnosis not present

## 2024-08-28 DIAGNOSIS — E860 Dehydration: Secondary | ICD-10-CM | POA: Diagnosis present

## 2024-08-28 DIAGNOSIS — I700 Atherosclerosis of aorta: Secondary | ICD-10-CM | POA: Diagnosis present

## 2024-08-28 DIAGNOSIS — R627 Adult failure to thrive: Secondary | ICD-10-CM | POA: Diagnosis present

## 2024-08-28 DIAGNOSIS — Z66 Do not resuscitate: Secondary | ICD-10-CM | POA: Diagnosis present

## 2024-08-28 DIAGNOSIS — M6282 Rhabdomyolysis: Secondary | ICD-10-CM | POA: Diagnosis present

## 2024-08-28 DIAGNOSIS — Z781 Physical restraint status: Secondary | ICD-10-CM | POA: Diagnosis not present

## 2024-08-28 DIAGNOSIS — J4489 Other specified chronic obstructive pulmonary disease: Secondary | ICD-10-CM | POA: Diagnosis present

## 2024-08-28 DIAGNOSIS — I2510 Atherosclerotic heart disease of native coronary artery without angina pectoris: Secondary | ICD-10-CM | POA: Diagnosis present

## 2024-08-28 DIAGNOSIS — F03918 Unspecified dementia, unspecified severity, with other behavioral disturbance: Secondary | ICD-10-CM | POA: Diagnosis present

## 2024-08-28 LAB — CK: Total CK: 1145 U/L — ABNORMAL HIGH (ref 49–397)

## 2024-08-28 LAB — CBC
HCT: 38.1 % — ABNORMAL LOW (ref 39.0–52.0)
Hemoglobin: 12.1 g/dL — ABNORMAL LOW (ref 13.0–17.0)
MCH: 29.5 pg (ref 26.0–34.0)
MCHC: 31.8 g/dL (ref 30.0–36.0)
MCV: 92.9 fL (ref 80.0–100.0)
Platelets: 295 K/uL (ref 150–400)
RBC: 4.1 MIL/uL — ABNORMAL LOW (ref 4.22–5.81)
RDW: 15.3 % (ref 11.5–15.5)
WBC: 11.9 K/uL — ABNORMAL HIGH (ref 4.0–10.5)
nRBC: 0 % (ref 0.0–0.2)

## 2024-08-28 LAB — COMPREHENSIVE METABOLIC PANEL WITH GFR
ALT: 47 U/L — ABNORMAL HIGH (ref 0–44)
AST: 99 U/L — ABNORMAL HIGH (ref 15–41)
Albumin: 3.5 g/dL (ref 3.5–5.0)
Alkaline Phosphatase: 81 U/L (ref 38–126)
Anion gap: 10 (ref 5–15)
BUN: 20 mg/dL (ref 8–23)
CO2: 27 mmol/L (ref 22–32)
Calcium: 9.8 mg/dL (ref 8.9–10.3)
Chloride: 105 mmol/L (ref 98–111)
Creatinine, Ser: 0.99 mg/dL (ref 0.61–1.24)
GFR, Estimated: >60 mL/min
Glucose, Bld: 97 mg/dL (ref 70–99)
Potassium: 4 mmol/L (ref 3.5–5.1)
Sodium: 143 mmol/L (ref 135–145)
Total Bilirubin: 0.4 mg/dL (ref 0.0–1.2)
Total Protein: 7.3 g/dL (ref 6.5–8.1)

## 2024-08-28 LAB — MAGNESIUM: Magnesium: 2.2 mg/dL (ref 1.7–2.4)

## 2024-08-28 LAB — TSH: TSH: 1.08 u[IU]/mL (ref 0.350–4.500)

## 2024-08-28 MED ORDER — ARFORMOTEROL TARTRATE 15 MCG/2ML IN NEBU
15.0000 ug | INHALATION_SOLUTION | Freq: Two times a day (BID) | RESPIRATORY_TRACT | Status: DC
  Administered 2024-08-28 – 2024-09-01 (×10): 15 ug via RESPIRATORY_TRACT
  Filled 2024-08-28 (×11): qty 2

## 2024-08-28 MED ORDER — SENNOSIDES-DOCUSATE SODIUM 8.6-50 MG PO TABS
1.0000 | ORAL_TABLET | Freq: Every day | ORAL | Status: DC
  Administered 2024-08-28 – 2024-08-29 (×2): 1 via ORAL
  Filled 2024-08-28 (×2): qty 1

## 2024-08-28 MED ORDER — CARVEDILOL 6.25 MG PO TABS
6.2500 mg | ORAL_TABLET | Freq: Two times a day (BID) | ORAL | Status: DC
  Administered 2024-08-28 – 2024-09-02 (×9): 6.25 mg via ORAL
  Filled 2024-08-28 (×10): qty 1

## 2024-08-28 MED ORDER — HALOPERIDOL LACTATE 5 MG/ML IJ SOLN
2.0000 mg | Freq: Four times a day (QID) | INTRAMUSCULAR | Status: AC | PRN
  Administered 2024-08-28 – 2024-08-29 (×2): 2 mg via INTRAVENOUS
  Filled 2024-08-28 (×2): qty 1
  Filled 2024-08-28: qty 0.4

## 2024-08-28 MED ORDER — REVEFENACIN 175 MCG/3ML IN SOLN
175.0000 ug | Freq: Every day | RESPIRATORY_TRACT | Status: DC
  Administered 2024-08-28 – 2024-09-01 (×5): 175 ug via RESPIRATORY_TRACT
  Filled 2024-08-28 (×4): qty 3

## 2024-08-28 NOTE — Plan of Care (Signed)

## 2024-08-28 NOTE — Evaluation (Signed)
 Physical Therapy Evaluation Patient Details Name: Leon Estes MRN: [id number] DOB: May 09, 1931 Today's Date: 08/28/2024  History of Present Illness  89 year old male with recent hospitalization and discharge on 08/24/2024 for altered mental status presented to the hospital with increasing confusion disorientation. Dx of AKI, metabolic encephalopathy. Pt with past medical history of hypertension, paroxysmal atrial fibrillation not on anticoagulation, hyperlipidemia, CKD, COPD, dementia  Clinical Impression  Pt admitted with above diagnosis. Pt is oriented to self only. He required +2 mod assist for sit to stand, +2 max assist to take a few pivotal steps from bed to recliner with RW. Pt has poor standing balance and is a high fall risk. Patient will benefit from continued inpatient follow up therapy, <3 hours/day.  Pt currently with functional limitations due to the deficits listed below (see PT Problem List). Pt will benefit from acute skilled PT to increase their independence and safety with mobility to allow discharge.           If plan is discharge home, recommend the following: Two people to help with bathing/dressing/bathroom;Two people to help with walking and/or transfers;Assistance with cooking/housework;Assistance with feeding;Direct supervision/assist for medications management;Direct supervision/assist for financial management;Assist for transportation;Help with stairs or ramp for entrance;Supervision due to cognitive status   Can travel by private vehicle   No    Equipment Recommendations None recommended by PT  Recommendations for Other Services       Functional Status Assessment Patient has had a recent decline in their functional status and demonstrates the ability to make significant improvements in function in a reasonable and predictable amount of time.     Precautions / Restrictions Precautions Precautions: Fall Recall of Precautions/Restrictions:  Impaired Precaution/Restrictions Comments: pt with waist restraint Restrictions Weight Bearing Restrictions Per Provider Order: No      Mobility  Bed Mobility               General bed mobility comments: up at edge of bed with OT at start of session    Transfers Overall transfer level: Needs assistance Equipment used: Rolling walker (2 wheels) Transfers: Sit to/from Stand, Bed to chair/wheelchair/BSC Sit to Stand: Mod assist, +2 physical assistance   Step pivot transfers: Max assist, +2 physical assistance, +2 safety/equipment       General transfer comment: posterior lean in standing, assist to power up and for balance; assist to initiate pivotal steps to recliner and for standing balance during pivot from bed to recliner    Ambulation/Gait                  Stairs            Wheelchair Mobility     Tilt Bed    Modified Rankin (Stroke Patients Only)       Balance Overall balance assessment: Needs assistance Sitting-balance support: Feet unsupported, Bilateral upper extremity supported Sitting balance-Leahy Scale: Fair     Standing balance support: Bilateral upper extremity supported Standing balance-Leahy Scale: Poor Standing balance comment: posterior lean in standing requiring mod A to correct                             Pertinent Vitals/Pain Pain Assessment Faces Pain Scale: No hurt Breathing: normal Negative Vocalization: none Facial Expression: smiling or inexpressive Body Language: relaxed Consolability: no need to console PAINAD Score: 0    Home Living Family/patient expects to be discharged to:: Unsure Living Arrangements: Spouse/significant other Available Help at Discharge:  Family;Available 24 hours/day (Wife available 24/7. Adult children, adult grandchildren, and adult and minor great-grandchildren live near by and can all assist with at least one daughter, who is a retired LAWYER, it trainer.) Type of Home:  House Home Access: Stairs to enter;Ramped entrance Entrance Stairs-Rails: Right Entrance Stairs-Number of Steps: 2-3 (at backdoor; also have deck with ramp)   Home Layout: One level Home Equipment: Agricultural Consultant (2 wheels);BSC/3in1;Rollator (4 wheels);Wheelchair - manual;Grab bars - tub/shower (hurrycane; handheld urinal) Additional Comments: All info from prior evaluation 08/22/24. Pt not able to provide home info 2* HOH and confusion. Multiple family members live 5-10 minutes away. Family present to provide home set up during session.    Prior Function Prior Level of Function : Independent/Modified Independent;Needs assist;Patient poor historian/Family not available             Mobility Comments: Ind, likes to drive golf cart around. ADLs Comments: Ind to Supervision, wife will set up showering or dressing for patient when needed. Wife assists with medication management.     Extremity/Trunk Assessment   Upper Extremity Assessment Upper Extremity Assessment: Defer to OT evaluation    Lower Extremity Assessment Lower Extremity Assessment: Difficult to assess due to impaired cognition;Generalized weakness    Cervical / Trunk Assessment Cervical / Trunk Assessment: Kyphotic  Communication   Communication Communication: Impaired Factors Affecting Communication: Hearing impaired;Difficulty expressing self;Reduced clarity of speech    Cognition Arousal: Alert Behavior During Therapy: WFL for tasks assessed/performed   PT - Cognitive impairments: Orientation, Awareness, Safety/Judgement, Problem solving, Sequencing, Initiation, Attention, No family/caregiver present to determine baseline                       PT - Cognition Comments: able to state birthdate, not oriented to location Following commands: Impaired Following commands impaired: Follows one step commands inconsistently     Cueing Cueing Techniques: Verbal cues, Tactile cues, Visual cues     General  Comments      Exercises     Assessment/Plan    PT Assessment Patient needs continued PT services  PT Problem List Decreased strength;Decreased mobility;Decreased safety awareness;Decreased range of motion;Decreased coordination;Decreased knowledge of precautions;Decreased activity tolerance;Decreased cognition;Decreased balance;Decreased knowledge of use of DME;Decreased skin integrity       PT Treatment Interventions DME instruction;Therapeutic exercise;Gait training;Balance training;Stair training;Neuromuscular re-education;Functional mobility training;Cognitive remediation;Therapeutic activities;Patient/family education    PT Goals (Current goals can be found in the Care Plan section)  Acute Rehab PT Goals PT Goal Formulation: Patient unable to participate in goal setting Time For Goal Achievement: 09/11/24 Potential to Achieve Goals: Fair    Frequency Min 2X/week     Co-evaluation PT/OT/SLP Co-Evaluation/Treatment: Yes Reason for Co-Treatment: Complexity of the patient's impairments (multi-system involvement);Necessary to address cognition/behavior during functional activity;For patient/therapist safety;To address functional/ADL transfers PT goals addressed during session: Mobility/safety with mobility;Balance;Proper use of DME         AM-PAC PT 6 Clicks Mobility  Outcome Measure Help needed turning from your back to your side while in a flat bed without using bedrails?: Total Help needed moving from lying on your back to sitting on the side of a flat bed without using bedrails?: Total Help needed moving to and from a bed to a chair (including a wheelchair)?: Total Help needed standing up from a chair using your arms (e.g., wheelchair or bedside chair)?: Total Help needed to walk in hospital room?: Total Help needed climbing 3-5 steps with a railing? : Total 6 Click Score:  6    End of Session Equipment Utilized During Treatment: Gait belt Activity Tolerance: Patient  tolerated treatment well Patient left: in chair;with chair alarm set;with call bell/phone within reach;with nursing/sitter in room Nurse Communication: Mobility status PT Visit Diagnosis: Unsteadiness on feet (R26.81);Muscle weakness (generalized) (M62.81);Other abnormalities of gait and mobility (R26.89)    Time: 1021-1030 PT Time Calculation (min) (ACUTE ONLY): 9 min   Charges:   PT Evaluation $PT Eval Moderate Complexity: 1 Mod   PT General Charges $$ ACUTE PT VISIT: 1 Visit         Sylvan Delon Copp PT 08/28/2024  Acute Rehabilitation Services  Office [phone]

## 2024-08-28 NOTE — Progress Notes (Signed)
 " PROGRESS NOTE  Leon Estes FMW:[id number] DOB: [birth year]   PCP: Bennett Reuben POUR, MD  Patient is from: Home.  Lives with family.  DOA: 08/27/2024 LOS: 0  Chief complaints Chief Complaint  Patient presents with   Weakness     Brief Narrative / Interim history: 89 year old M with PMH of cognitive impairment, HFmrEF, COPD, CKD, PAF not on anticoagulation, dysphagia, HTN and recent hospitalization from 1/6-1/12 for acute metabolic encephalopathy, AKI, dehydration and electrolyte abnormalities return to the hospital due to increased confusion, disorientation and decreased oral intake for the last 3 to 4 days.  Spouse unable to care for patient and they brought patient back.  In ED, slightly tachypneic.  Other vital stable.  COVID-19, influenza and RSV PCR nonreactive.  UA does not suggest UTI. Cr 1.3 (baseline 0.8).  WBC 14.1.  Lactic acid 1.6.  CXR with some atelectasis.  CT abdomen and pelvis without acute finding but showed RLL atelectasis, hepatic steatosis, stable 3.1 left renal cyst, moderate prostate enlargement, 9 x 9 mm lung nodule and aortic sclerosis.  Patient was admitted with working diagnosis of  physical and mental decline with encephalopathy , generalized weakness.  Subjective: Seen and examined earlier this morning.  Patient was agitated, confused and not redirectable requiring IV Haldol  and restraints overnight.  He is sleepy this morning but awakes to voice.  Only oriented to self.  He knows he is in [place].  Does not follow commands.  Falls back asleep.   Assessment and plan: Dementia with behavioral disturbance/delirium: Patient's symptoms consistent with delirium likely due to dehydration in the setting of poor p.o. intake.  Low suspicion for infectious process.  UA not convincing.  CXR without significant finding.  CT abdomen and pelvis without acute finding.  He is encephalopathy workup on 1/7 including MRI brain, B12, RPR, TSH and ammonia unrevealing.  He is  currently sleepy but wakes to voice.  He is oriented to self and [facility].  Does not follow commands. -Continue gentle hydration.  Avoid diuretics due to risk of dehydration. -Reorientation and delirium precaution -Continue p.o. Seroquel  at night. -Continue IV Haldol  as needed. -Continue abdominal belt due to risk of fall and serious injury. -Consult palliative   Acute kidney injury: Likely due to dehydration in the setting of poor p.o. intake.  Resolving. Recent Labs    08/18/24 2016 08/18/24 2031 08/19/24 0502 08/20/24 0432 08/21/24 0349 08/22/24 0335 08/23/24 0316 08/24/24 0415 08/27/24 0950 08/28/24 0447  BUN 12 11 14 11 15 18 21 17  31* 20  CREATININE 1.50* 1.41* 1.16 0.97 0.91 1.07 1.10 0.83 1.35* 0.99  - Continue gentle hydration due to poor p.o. intake - Avoid diuretics due to risk of dehydration    Generalized weakness, failure to thrive: Recurrent hospitalization due to delirium and poor p.o. intake.  Seems to have advanced dementia.  Appropriately DNR. -Therapy recommended SNF -Palliative consult for further goal of care discussion   Paroxysmal atrial fibrillation: Rate controlled.  Not on anticoagulation. -Coreg  as above.   History of hypertension: BP elevated  but difficult to get accurate measurement due to patient's delirium. -Increase Coreg  to 6.25 mg twice daily -Continue holding losartan  -Avoid diuretics due to risk of dehydration  Chronic HFmrEF: TTE in 2023 with LVEF of 45 to 50%, GH, G1 DD, severe LAE.  Appears euvolemic on exam. - Monitor fluid and respiratory status while on IV fluid  Chronic COPD: Stable. - Optimize nebulizers and discontinue oxygen.  Mild leukocytosis: Likely reactive/demargination.  Low suspicion  for infection.  Improved without antibiotics.  Mild rhabdomyolysis: CK 1100. - Continue IV fluid.  Hyperlipidemia -Hold statin due to rhabdomyolysis.  Elevated AST: Likely due to rhabdo - Continue monitoring  Pulmonary  nodule: CT showed 9 x 9 mm RLL nodule.  Radiology recommended noncontrast CT chest for complete evaluation of lung disease on elective basis.  Given his age and comorbidity, I doubt there is any utility of CT chest.  BPH: CT showed moderate prostatic enlargement. - Continue home Flomax  - Bladder scan as needed. - Strict intake and output  Body mass index is 21.89 kg/m.         Wound 08/27/24 2100 Pressure Injury Buttocks Right;Left Stage 1 -  Intact skin with non-blanchable redness of a localized area usually over a bony prominence. (Active)   DVT prophylaxis:  heparin  injection 5,000 Units Start: 08/27/24 2200  Code Status: DNR Family Communication: None at bedside. Level of care: Med-Surg Status is: Observation The patient will require care spanning > 2 midnights and should be moved to inpatient because: Dementia with behavioral disturbance/delirium, failure to thrive, generalized weakness   Final disposition: SNF   55 minutes with more than 50% spent in reviewing records, counseling patient/family and coordinating care.  Consultants:  Palliative medicine  Procedures: None  Microbiology summarized: COVID-19, influenza and RSV PCR nonreactive  Objective: Vitals:   08/28/24 0503 08/28/24 0848 08/28/24 0940 08/28/24 0941  BP: (!) 153/86 (!) 175/119    Pulse: 80 89    Resp: 18 16    Temp: 97.9 F (36.6 C) 98.6 F (37 C)    TempSrc:  Oral    SpO2: 100% 100% 98% 97%  Weight: 65.3 kg     Height:        Examination:  GENERAL: No apparent distress.  Nontoxic. HEENT: MMM.  Vision and hearing grossly intact.  NECK: Supple.  No apparent JVD.  RESP:  No IWOB.  Fair aeration bilaterally. CVS:  RRR. Heart sounds normal.  ABD/GI/GU: BS+. Abd soft, NTND.  Abdominal belt in place. MSK/EXT:  Moves extremities. No apparent deformity. No edema.  Bilateral mittens. SKIN: no apparent skin lesion or wound NEURO: Sleepy but wakes to voice.  Oriented to self and [facility].   Does not follow commands.  No apparent focal neuro deficit.  Sch Meds:  Scheduled Meds:  arformoterol   15 mcg Nebulization BID   budesonide   0.5 mg Nebulization BID   carvedilol   3.125 mg Oral BID WC   heparin   5,000 Units Subcutaneous Q8H   pantoprazole   40 mg Oral Daily   QUEtiapine   25 mg Oral QHS   revefenacin   175 mcg Nebulization Daily   senna-docusate  1 tablet Oral Daily   sodium chloride  flush  3 mL Intravenous Q12H   tamsulosin   0.4 mg Oral Daily   Continuous Infusions:  sodium chloride  75 mL/hr at 08/27/24 2335   sodium chloride      PRN Meds:.sodium chloride , acetaminophen  **OR** acetaminophen , haloperidol  lactate, hydrALAZINE , ipratropium-albuterol , ondansetron  **OR** ondansetron  (ZOFRAN ) IV, polyethylene glycol, sodium chloride  flush  Antimicrobials: Anti-infectives (From admission, onward)    None        I have personally reviewed the following labs and images: CBC: Recent Labs  Lab 08/27/24 0950 08/28/24 0447  WBC 14.1* 11.9*  HGB 11.7* 12.1*  HCT 37.1* 38.1*  MCV 94.4 92.9  PLT 316 295   BMP &GFR Recent Labs  Lab 08/22/24 0335 08/23/24 0316 08/24/24 0415 08/27/24 0950 08/28/24 0447  NA 141 149* 140 141  143  K 3.6 3.8 2.5* 3.9 4.0  CL 102 106 102 104 105  CO2 20* 24 26 28 27   GLUCOSE 92 127* 132* 121* 97  BUN 18 21 17  31* 20  CREATININE 1.07 1.10 0.83 1.35* 0.99  CALCIUM  8.8* 9.6 8.9 9.4 9.8  MG  --   --  1.8  --  2.2  PHOS  --   --  1.5*  --   --    Estimated Creatinine Clearance: 44.9 mL/min (by C-G formula based on SCr of 0.99 mg/dL). Liver & Pancreas: Recent Labs  Lab 08/27/24 0950 08/28/24 0447  AST 81* 99*  ALT 45* 47*  ALKPHOS 71 81  BILITOT 0.3 0.4  PROT 6.7 7.3  ALBUMIN  3.3* 3.5   No results for input(s): LIPASE, AMYLASE in the last 168 hours. No results for input(s): AMMONIA in the last 168 hours. Diabetic: No results for input(s): HGBA1C in the last 72 hours. Recent Labs  Lab 08/27/24 1030  GLUCAP 97    Cardiac Enzymes: Recent Labs  Lab 08/28/24 0447  CKTOTAL 1,145*   No results for input(s): PROBNP in the last 8760 hours. Coagulation Profile: No results for input(s): INR, PROTIME in the last 168 hours. Thyroid  Function Tests: Recent Labs    08/28/24 0447  TSH 1.080   Lipid Profile: No results for input(s): CHOL, HDL, LDLCALC, TRIG, CHOLHDL, LDLDIRECT in the last 72 hours. Anemia Panel: No results for input(s): VITAMINB12, FOLATE, FERRITIN, TIBC, IRON, RETICCTPCT in the last 72 hours. Urine analysis:    Component Value Date/Time   COLORURINE YELLOW 08/27/2024 1054   APPEARANCEUR CLEAR 08/27/2024 1054   APPEARANCEUR Clear 04/29/2013 1543   LABSPEC 1.026 08/27/2024 1054   LABSPEC 1.020 04/29/2013 1543   PHURINE 5.0 08/27/2024 1054   GLUCOSEU NEGATIVE 08/27/2024 1054   GLUCOSEU Negative 04/29/2013 1543   HGBUR SMALL (A) 08/27/2024 1054   BILIRUBINUR NEGATIVE 08/27/2024 1054   BILIRUBINUR Negative 03/14/2022 1506   BILIRUBINUR Negative 04/29/2013 1543   KETONESUR NEGATIVE 08/27/2024 1054   PROTEINUR 100 (A) 08/27/2024 1054   UROBILINOGEN 0.2 03/14/2022 1506   UROBILINOGEN 0.2 03/08/2012 1624   NITRITE NEGATIVE 08/27/2024 1054   LEUKOCYTESUR NEGATIVE 08/27/2024 1054   LEUKOCYTESUR Negative 04/29/2013 1543   Sepsis Labs: Invalid input(s): PROCALCITONIN, LACTICIDVEN  Microbiology: Recent Results (from the past 240 hours)  Resp panel by RT-PCR (RSV, Flu A&B, Covid) Anterior Nasal Swab     Status: None   Collection Time: 08/18/24 11:16 PM   Specimen: Anterior Nasal Swab  Result Value Ref Range Status   SARS Coronavirus 2 by RT PCR NEGATIVE NEGATIVE Final   Influenza A by PCR NEGATIVE NEGATIVE Final   Influenza B by PCR NEGATIVE NEGATIVE Final    Comment: (NOTE) The Xpert Xpress SARS-CoV-2/FLU/RSV plus assay is intended as an aid in the diagnosis of influenza from Nasopharyngeal swab specimens and should not be used as a sole basis  for treatment. Nasal washings and aspirates are unacceptable for Xpert Xpress SARS-CoV-2/FLU/RSV testing.  Fact Sheet for Patients: bloggercourse.com  Fact Sheet for Healthcare Providers: seriousbroker.it  This test is not yet approved or cleared by the [place]  FDA and has been authorized for detection and/or diagnosis of SARS-CoV-2 by FDA under an Emergency Use Authorization (EUA). This EUA will remain in effect (meaning this test can be used) for the duration of the COVID-19 declaration under Section 564(b)(1) of the Act, 21 U.S.C. section 360bbb-3(b)(1), unless the authorization is terminated or revoked.  Resp Syncytial Virus by PCR NEGATIVE NEGATIVE Final    Comment: (NOTE) Fact Sheet for Patients: bloggercourse.com  Fact Sheet for Healthcare Providers: seriousbroker.it  This test is not yet approved or cleared by the [place]  FDA and has been authorized for detection and/or diagnosis of SARS-CoV-2 by FDA under an Emergency Use Authorization (EUA). This EUA will remain in effect (meaning this test can be used) for the duration of the COVID-19 declaration under Section 564(b)(1) of the Act, 21 U.S.C. section 360bbb-3(b)(1), unless the authorization is terminated or revoked.  Performed at [facility], [address] [place], [place] [address]   Culture, blood (single)     Status: None   Collection Time: 08/18/24 11:26 PM   Specimen: BLOOD RIGHT ARM  Result Value Ref Range Status   Specimen Description BLOOD RIGHT ARM  Final   Special Requests   Final    BOTTLES DRAWN AEROBIC ONLY Blood Culture adequate volume   Culture   Final    NO GROWTH 5 DAYS Performed at [facility], [address], [place], [place] [address]    Report Status 08/24/2024 FINAL  Final  Urine Culture (for pregnant, neutropenic or urologic patients or patients with an  indwelling urinary catheter)     Status: None   Collection Time: 08/22/24  7:05 AM   Specimen: Urine, Clean Catch  Result Value Ref Range Status   Specimen Description URINE, CLEAN CATCH  Final   Special Requests NONE  Final   Culture   Final    NO GROWTH Performed at [facility], [address] [place], [place] [address]    Report Status 08/23/2024 FINAL  Final  Resp panel by RT-PCR (RSV, Flu A&B, Covid) Anterior Nasal Swab     Status: None   Collection Time: 08/27/24 12:40 PM   Specimen: Anterior Nasal Swab  Result Value Ref Range Status   SARS Coronavirus 2 by RT PCR NEGATIVE NEGATIVE Final    Comment: (NOTE) SARS-CoV-2 target nucleic acids are NOT DETECTED.  The SARS-CoV-2 RNA is generally detectable in upper respiratory specimens during the acute phase of infection. The lowest concentration of SARS-CoV-2 viral copies this assay can detect is 138 copies/mL. A negative result does not preclude SARS-Cov-2 infection and should not be used as the sole basis for treatment or other patient management decisions. A negative result may occur with  improper specimen collection/handling, submission of specimen other than nasopharyngeal swab, presence of viral mutation(s) within the areas targeted by this assay, and inadequate number of viral copies(<138 copies/mL). A negative result must be combined with clinical observations, patient history, and epidemiological information. The expected result is Negative.  Fact Sheet for Patients:  bloggercourse.com  Fact Sheet for Healthcare Providers:  seriousbroker.it  This test is no t yet approved or cleared by the [place]  FDA and  has been authorized for detection and/or diagnosis of SARS-CoV-2 by FDA under an Emergency Use Authorization (EUA). This EUA will remain  in effect (meaning this test can be used) for the duration of the COVID-19 declaration under Section 564(b)(1) of  the Act, 21 U.S.C.section 360bbb-3(b)(1), unless the authorization is terminated  or revoked sooner.       Influenza A by PCR NEGATIVE NEGATIVE Final   Influenza B by PCR NEGATIVE NEGATIVE Final    Comment: (NOTE) The Xpert Xpress SARS-CoV-2/FLU/RSV plus assay is intended as an aid in the diagnosis of influenza from Nasopharyngeal swab specimens and should not be used  as a sole basis for treatment. Nasal washings and aspirates are unacceptable for Xpert Xpress SARS-CoV-2/FLU/RSV testing.  Fact Sheet for Patients: bloggercourse.com  Fact Sheet for Healthcare Providers: seriousbroker.it  This test is not yet approved or cleared by the [place]  FDA and has been authorized for detection and/or diagnosis of SARS-CoV-2 by FDA under an Emergency Use Authorization (EUA). This EUA will remain in effect (meaning this test can be used) for the duration of the COVID-19 declaration under Section 564(b)(1) of the Act, 21 U.S.C. section 360bbb-3(b)(1), unless the authorization is terminated or revoked.     Resp Syncytial Virus by PCR NEGATIVE NEGATIVE Final    Comment: (NOTE) Fact Sheet for Patients: bloggercourse.com  Fact Sheet for Healthcare Providers: seriousbroker.it  This test is not yet approved or cleared by the [place]  FDA and has been authorized for detection and/or diagnosis of SARS-CoV-2 by FDA under an Emergency Use Authorization (EUA). This EUA will remain in effect (meaning this test can be used) for the duration of the COVID-19 declaration under Section 564(b)(1) of the Act, 21 U.S.C. section 360bbb-3(b)(1), unless the authorization is terminated or revoked.  Performed at [facility], [address], [place], [place] [address]     Radiology Studies: CT ABDOMEN PELVIS W CONTRAST Result Date: 08/27/2024 CLINICAL DATA:  Abdominal pain and  increasing weakness. EXAM: CT ABDOMEN AND PELVIS WITH CONTRAST TECHNIQUE: Multidetector CT imaging of the abdomen and pelvis was performed using the standard protocol following bolus administration of intravenous contrast. RADIATION DOSE REDUCTION: This exam was performed according to the departmental dose-optimization program which includes automated exposure control, adjustment of the mA and/or kV according to patient size and/or use of iterative reconstruction technique. CONTRAST:  OMNIPAQUE  IOHEXOL  300 MG/ML  SOLN COMPARISON:  02/16/2023 FINDINGS: Lower chest: Mild cardiomegaly. Calcified plaque over the right coronary artery as well as descending thoracic aorta. Lung bases demonstrate dependent atelectasis posterior left base. 9 x 9 mm nodule with possible linear associated density as this may represent scarring versus true nodule. This is larger compared to the prior study. Heterogeneous opacification over the right lower lobe which may be due to atelectasis although pneumonia is possible. Hepatobiliary: Gallbladder is contracted. Mild diffuse low-attenuation of the liver compared to the spleen compatible with a mild degree of steatosis. No focal liver mass. Biliary tree is unremarkable. Pancreas: Normal. Spleen: Normal. Adrenals/Urinary Tract: Adrenal glands are unremarkable. Kidneys are normal in size. Stable 3.1 cm cyst over the upper pole left kidney. No hydronephrosis or nephrolithiasis. Ureters and bladder are normal. Stomach/Bowel: Stomach is normal. Possible diverticula over the proximal duodenum unchanged. Small bowel is otherwise unremarkable. Prior appendectomy. Mild diverticulosis of the colon. Mild fecal retention over the rectosigmoid colon. Vascular/Lymphatic: Moderate calcified plaque over the abdominal aorta. Aorta is normal caliber. Remaining vascular structures are unremarkable. No adenopathy. Reproductive: Moderate prostatic enlargement measuring 5.1 x 6.7 x 6.2 cm. Other: No free  fluid or focal inflammatory change. Musculoskeletal: Degenerative change of the spine with multilevel disc disease over the lumbar spine from the L2-3 level to the L5-S1 level. Grade 1-2 anterolisthesis of L5 on S1 unchanged. IMPRESSION: 1. No acute findings in the abdomen/pelvis. 2. Heterogeneous opacification over the right lower lobe which may be due to atelectasis, although pneumonia is possible. 3. Mild diverticulosis of the colon. Mild fecal retention over the rectosigmoid colon. 4. Mild hepatic steatosis. 5. Stable 3.1 cm left renal cyst. 6. Moderate prostatic enlargement. 7. 9 x 9 mm nodule with possible linear  associated density as this may represent scarring versus true nodule. This is larger compared to the prior study. Recommend noncontrast chest CT for complete evaluation of the lungs on elective basis. 8. Aortic atherosclerosis. Atherosclerotic coronary artery disease. Aortic Atherosclerosis (ICD10-I70.0). Electronically Signed   By: Toribio Agreste M.D.   On: 08/27/2024 12:58      Sabastion Hrdlicka T. Aizlyn Schifano [facility]  If 7PM-7AM, please contact night-coverage [email or web address] 08/28/2024, 10:57 AM   "

## 2024-08-28 NOTE — Progress Notes (Signed)
 Pt is confused, agitated and A/O X1. Pt has pulled his gown off, has set off the bed  countless times. Pt has pulled four primafit/purewicks off, and is incontinent soiling the bed. Pt uncooperative and aggressive at times, on O2 that he has been repeatedly taking off and desating. Bed alarm is on, side rails up X3 ,call bell within reach, fall mats in place and bed in lowest position. Pt had scheduled PO 25 mg of Seroquel  he finally took at 2324 after being crushed in ice cream, which was ineffective. Called family to see if anyone could come to bedside and stay with pt. Called Pt daughter Leon Estes, no answer. Called Pt other daughter, Leon Estes, and explained pt behavior and concern for his safety. Leon Estes gave permission for PRN medication and/or restraints if necessary for pt care and safety. She states that she will speak with her mother and sister this AM about what may be needed moving forward. Notified provider of pt behavior, concerns and conversation with family. New orders received.

## 2024-08-28 NOTE — Plan of Care (Signed)
  Problem: Clinical Measurements: Goal: Ability to maintain clinical measurements within normal limits will improve Outcome: Progressing Goal: Diagnostic test results will improve Outcome: Progressing Goal: Cardiovascular complication will be avoided Outcome: Progressing   Problem: Safety: Goal: Ability to remain free from injury will improve Outcome: Progressing

## 2024-08-28 NOTE — Progress Notes (Signed)
 Mobility Specialist Progress Note:   08/28/24 1531  Mobility  Activity Pivoted/transferred from chair to bed  Level of Assistance +2 (takes two people)  Assistive Device Stedy  Activity Response Tolerated fair  Mobility visit 1 Mobility  Mobility Specialist Start Time (ACUTE ONLY) 1445  Mobility Specialist Stop Time (ACUTE ONLY) 1455  Mobility Specialist Time Calculation (min) (ACUTE ONLY) 10 min   Assisted Pt back to bed. Mod A +2 sit to stand. Returned to bed with all needs met. Left in room with NT.  [employer] - Mobility Specialist

## 2024-08-28 NOTE — Evaluation (Signed)
 Clinical/Bedside Swallow Evaluation Patient Details  Name: Leon Estes MRN: [id number] Date of Birth: 11-26-1930  Today's Date: 08/28/2024 Time: SLP Start Time (ACUTE ONLY): 1157 SLP Stop Time (ACUTE ONLY): 1217 SLP Time Calculation (min) (ACUTE ONLY): 20 min  Past Medical History:  Past Medical History:  Diagnosis Date   Asthma    BPH (benign prostatic hypertrophy)    Cataract    Chronic renal disease, stage III (HCC)    Chronic sinusitis    COPD (chronic obstructive pulmonary disease) (HCC)    Coronary artery disease    2 stents   Gall stones    Hearing loss    DOES NOT WEAR HEARING AIDS   High cholesterol    Hypertension    Lactic acid acidosis 10/12/2021   Macular degeneration    right eye   Rash 12/04/2021   Septic shock (HCC) 10/10/2021   Past Surgical History:  Past Surgical History:  Procedure Laterality Date   APPENDECTOMY     CORONARY ANGIOPLASTY WITH STENT PLACEMENT  1998   2 stents   TRANSURETHRAL RESECTION OF PROSTATE     HPI:  89 yo male presenting to ED 1/15 with AMS and decreased PO intake. CT C/A/P shows opacification over the RLL which may be due to atelectasis. Recently admitted and assessed by SLP 08/24/24, who recommended Dys 1 with thin liquids after previous notes indicate baseline diet was Dys 3/thin. PMH includes cognitive impairment, HFmrEF, COPD, CKD, PAF not on anticoagulation, dysphagia, HTN    Assessment / Plan / Recommendation  Clinical Impression  Would resume Dys 1 solids and thin liquids for now. Given h/o dementia, he is at risk for chronic pharyngeal dysphagia. Will sign off for now but recommend post-acute dysphagia interventions to assess ability to resume baseline Dys 3 diet once he returns to a familiar environment and mentation is more consistent.  Pt is alert but disoriented and sitting upright in the chair. He is edentulous and needs setup assistance for feeding. No signs clinically concerning for aspiration were observed with  consecutive sips of thin liquids. He exhibits disorganized and ineffective mashing with bite-sized solids. After swallowing, whole bolus residuals were noted in his lateral sulci and required multiple liquid washes to clear. Oral transit remains disorganzied with purees but he clears his mouth completely.   SLP Visit Diagnosis: Dysphagia, unspecified (R13.10)    Aspiration Risk  Moderate aspiration risk    Diet Recommendation           Other Recommendations Oral Care Recommendations: Oral care BID     Swallow Evaluation Recommendations Recommendations: PO diet PO Diet Recommendation: Dysphagia 1 (Pureed);Thin liquids (Level 0) Liquid Administration via: Cup;Straw Medication Administration: Crushed with puree Supervision: Full assist for feeding;Full supervision/cueing for swallowing strategies Swallowing strategies  : Check for pocketing or oral holding;Minimize environmental distractions Postural changes: Position pt fully upright for meals Oral care recommendations: Oral care BID (2x/day)   Assistance Recommended at Discharge    Functional Status Assessment Patient has had a recent decline in their functional status and demonstrates the ability to make significant improvements in function in a reasonable and predictable amount of time.  Frequency and Duration            Prognosis Prognosis for improved oropharyngeal function: Fair Barriers to Reach Goals: Cognitive deficits;Time post onset      Swallow Study   General HPI: 89 yo male presenting to ED 1/15 with AMS and decreased PO intake. CT C/A/P shows opacification over the RLL  which may be due to atelectasis. Recently admitted and assessed by SLP 08/24/24, who recommended Dys 1 with thin liquids after previous notes indicate baseline diet was Dys 3/thin. PMH includes cognitive impairment, HFmrEF, COPD, CKD, PAF not on anticoagulation, dysphagia, HTN Type of Study: Bedside Swallow Evaluation Previous Swallow Assessment: see  HPI Diet Prior to this Study: Dysphagia 3 (mechanical soft);Thin liquids (Level 0) Temperature Spikes Noted: No Respiratory Status: Room air History of Recent Intubation: No Behavior/Cognition: Alert;Cooperative;Distractible;Requires cueing Oral Cavity Assessment: Within Functional Limits Oral Care Completed by SLP: No Oral Cavity - Dentition: Edentulous Vision: Functional for self-feeding Self-Feeding Abilities: Able to feed self;Needs set up Patient Positioning: Upright in chair Baseline Vocal Quality: Normal Volitional Cough: Cognitively unable to elicit Volitional Swallow: Unable to elicit    Oral/Motor/Sensory Function Overall Oral Motor/Sensory Function: Within functional limits   Ice Chips Ice chips: Not tested   Thin Liquid Thin Liquid: Within functional limits Presentation: Straw    Nectar Thick Nectar Thick Liquid: Not tested   Honey Thick Honey Thick Liquid: Not tested   Puree Puree: Impaired Presentation: Spoon Oral Phase Impairments: Reduced lingual movement/coordination   Solid     Solid: Impaired Presentation: Spoon Oral Phase Impairments: Reduced lingual movement/coordination;Impaired mastication Oral Phase Functional Implications: Oral residue      Damien Blumenthal, M.A., CCC-SLP Speech Language Pathology, Acute Rehabilitation Services  Secure Chat preferred [phone]  08/28/2024,1:28 PM

## 2024-08-28 NOTE — Progress Notes (Signed)
" ° ° ° °  Patient Name: Leon Estes           DOB: Aug 31, 1930  MRN: [id number]      Admission Date: 08/27/2024  Attending Provider: Sonjia Held, MD  Primary Diagnosis: AKI (acute kidney injury)   Level of care: Med-Surg   OVERNIGHT EVENT   HPI/ Events of Note [name], 89 y.o. male, was admitted on 08/27/2024 for AKI (acute kidney injury).   Agitated, restless, and confused, oriented to self only History of dementia  Nursing staff reports patient has removed medical equipment multiple times including oxygen. Attempting to get out of bed multiple times.  High fall risk.  No safety sitter available at this time. Unfortunately, he is not following commands despite multiple attempts at redirection by staff.  Patient has already received Seroquel  which has provided no relief.  During previous hospitalization, patient had severe delirium and required Haldol  for agitation. Will trial low-dose Haldol .  May need to use temporary restraints  if he continues to remove oxygen and other medical equipment.   Plan: Will trial IV Haldol  for agitation.  Restraint to be discontinued as soon as patient is able to safely have them removed.   Lincon Sahlin, DNP, ACNPC- [facility] [place]       "

## 2024-08-28 NOTE — Progress Notes (Signed)
 [facility] Liaison Note:  This patient is currently enrolled in [facility] outpatient-based palliative care.  Please call for any outpatient based palliative care related questions or concerns.  Thank you,  Greig Basket, BSN, RN [facility] Liaison [phone]

## 2024-08-28 NOTE — Evaluation (Signed)
 Occupational Therapy Evaluation Patient Details Name: Leon Estes MRN: [id number] DOB: 1930-10-31 Today's Date: 08/28/2024   History of Present Illness   89 year old male with recent hospitalization and discharge on 08/24/2024 for altered mental status presented to the hospital this tome with increasing confusion/disorientation. He was found to have AKI, metabolic encephalopathy. Pt with past medical history of hypertension, paroxysmal atrial fibrillation not on anticoagulation, hyperlipidemia, CKD, COPD, dementia     Clinical Impressions The pt is currently presenting with the below listed deficits (see OT problem list). As such, his ADL performance is compromised and he requires increased assistance for self care management. During the session today, he required max assist for supine to sit, max assist for lower body dressing, mod assist x2 to stand and max assist to step pivot to the bedside chair. He was also noted to be with confusion/disorientation (pt was oriented to person only), poor standing balance, generalized weakness, impaired command follow, and decreased sustained attention to tasks. He is at high risk for falls and restricted ADL participation. He will benefit from further OT services to maximize his safety and independence with ADLs. Patient will benefit from continued inpatient follow up therapy, <3 hours/day.      If plan is discharge home, recommend the following:   Two people to help with walking and/or transfers;Assistance with cooking/housework;Assistance with feeding;Direct supervision/assist for medications management;Direct supervision/assist for financial management;Assist for transportation;Help with stairs or ramp for entrance;Supervision due to cognitive status;A lot of help with bathing/dressing/bathroom     Functional Status Assessment   Patient has had a recent decline in their functional status and demonstrates the ability to make significant  improvements in function in a reasonable and predictable amount of time.     Equipment Recommendations   Other (comment) (to be determined)     Recommendations for Other Services         Precautions/Restrictions   Precautions Precautions: Fall Precaution/Restrictions Comments: pt with waist restraint Restrictions Weight Bearing Restrictions Per Provider Order: No     Mobility Bed Mobility Overal bed mobility: Needs Assistance Bed Mobility: Supine to Sit     Supine to sit: Max assist     General bed mobility comments: Pt required increased multi-modal cues for sequencing and attention, though increased cues appeared to be needed at least partly due to the pt's hearing impairment and confusion    Transfers Overall transfer level: Needs assistance Equipment used: Rolling walker (2 wheels) Transfers: Sit to/from Stand, Bed to chair/wheelchair/BSC Sit to Stand: Mod assist, +2 physical assistance     Step pivot transfers: Max assist, +2 physical assistance, +2 safety/equipment     General transfer comment: posterior lean in standing, assist to power up and for balance; assist to initiate pivot steps to recliner and for standing balance during pivot from bed to recliner      Balance     Sitting balance-Leahy Scale: Fair       Standing balance-Leahy Scale: Poor           ADL either performed or assessed with clinical judgement   ADL Overall ADL's : Needs assistance/impaired Eating/Feeding: Set up;Supervision/ safety;Sitting   Grooming: Moderate assistance;Cueing for safety;Cueing for sequencing;Sitting   Upper Body Bathing: Moderate assistance;Sitting;Cueing for sequencing;Cueing for safety   Lower Body Bathing: Maximal assistance;Sitting/lateral leans;Cueing for sequencing;Cueing for safety   Upper Body Dressing : Maximal assistance;Sitting;Cueing for safety;Cueing for sequencing   Lower Body Dressing: Maximal assistance;Sitting/lateral  leans;Cueing for sequencing;Cueing for safety   Toilet Transfer: Maximal  assistance;Stand-pivot;BSC/3in1;Cueing for sequencing;Cueing for safety   Toileting- Clothing Manipulation and Hygiene: Maximal assistance;Cueing for safety;Cueing for sequencing;Sit to/from stand         General ADL Comments: The pt's current ADL performance is a least partly compromised at current, due to his altered mental status/confusion, inconsistent command follow, decreased insight, and impaired attention.                  Pertinent Vitals/Pain Pain Assessment Pain Assessment: Faces Pain Score: 0-No pain     Extremity/Trunk Assessment Upper Extremity Assessment Upper Extremity Assessment: Difficult to assess due to impaired cognition;Generalized weakness   Lower Extremity Assessment Lower Extremity Assessment: Difficult to assess due to impaired cognition;Generalized weakness      Communication Communication Communication: Impaired Factors Affecting Communication: Hearing impaired   Cognition Arousal: Alert   Cognition: Cognition impaired, History of cognitive impairments (Patient has a history of dementia.)   Orientation impairments: Place, Time, Situation Awareness: Intellectual awareness intact, Online awareness impaired Memory impairment (select all impairments): Short-term memory, Working civil service fast streamer, Conservation officer, historic buildings Attention impairment (select first level of impairment): Sustained attention, Divided attention Executive functioning impairment (select all impairments): Problem solving, Organization          Following commands: Impaired Following commands impaired: Follows one step commands inconsistently     Cueing  General Comments   Cueing Techniques: Verbal cues;Tactile cues;Visual cues              Home Living Family/patient expects to be discharged to:: Unsure Living Arrangements: Spouse/significant other Available Help at Discharge: Family;Available 24  hours/day Type of Home: House Home Access: Stairs to enter;Ramped entrance Entrance Stairs-Number of Steps: 2-3 Entrance Stairs-Rails: Right Home Layout: One level     Bathroom Shower/Tub: Walk-in shower         Home Equipment: Agricultural Consultant (2 wheels);BSC/3in1;Rollator (4 wheels);Wheelchair - manual;Grab bars - tub/shower   Additional Comments: All info from prior evaluation 08/22/24. Pt not able to provide home info 2* HOH and confusion. Multiple family members live 5-10 minutes away. Family present to provide home set up during session.      Prior Functioning/Environment Prior Level of Function : Patient poor historian/Family not available             Mobility Comments: Ind, likes to drive golf cart around. ADLs Comments: Ind to Supervision, wife will set up showering or dressing for patient when needed. Wife assists with medication management.    OT Problem List: Decreased activity tolerance;Impaired balance (sitting and/or standing);Decreased safety awareness;Decreased knowledge of use of DME or AE;Decreased knowledge of precautions;Decreased cognition   OT Treatment/Interventions: Self-care/ADL training;Therapeutic exercise;Energy conservation;DME and/or AE instruction;Therapeutic activities;Cognitive remediation/compensation;Visual/perceptual remediation/compensation;Patient/family education;Balance training      OT Goals(Current goals can be found in the care plan section)   Acute Rehab OT Goals OT Goal Formulation: Patient unable to participate in goal setting Time For Goal Achievement: 09/11/24 Potential to Achieve Goals: Fair ADL Goals Pt Will Perform Eating: with set-up;sitting Pt Will Perform Grooming: with supervision;with set-up;sitting Pt Will Perform Upper Body Dressing: with min assist;sitting Pt Will Transfer to Toilet: with contact guard assist;stand pivot transfer;bedside commode Additional ADL Goal #1: The pt will perform bed mobility with CGA, in  prep for progressive ADL participation.   OT Frequency:  Min 2X/week    Co-evaluation PT/OT/SLP Co-Evaluation/Treatment: Yes Reason for Co-Treatment: To address functional/ADL transfers PT goals addressed during session: Mobility/safety with mobility OT goals addressed during session: ADL's and self-care      AM-PAC  OT 6 Clicks Daily Activity     Outcome Measure Help from another person eating meals?: A Lot Help from another person taking care of personal grooming?: A Lot Help from another person toileting, which includes using toliet, bedpan, or urinal?: A Lot Help from another person bathing (including washing, rinsing, drying)?: A Lot Help from another person to put on and taking off regular upper body clothing?: A Lot Help from another person to put on and taking off regular lower body clothing?: A Lot 6 Click Score: 12   End of Session Equipment Utilized During Treatment: Gait belt Nurse Communication: Mobility status  Activity Tolerance: Patient tolerated treatment well Patient left: in chair;with call bell/phone within reach  OT Visit Diagnosis: Other abnormalities of gait and mobility (R26.89);Muscle weakness (generalized) (M62.81);Other symptoms and signs involving cognitive function                Time: 1010-1031 OT Time Calculation (min): 21 min Charges:  OT General Charges $OT Visit: 1 Visit OT Evaluation $OT Eval Moderate Complexity: 1 Mod    Catalia Massett J Harris, OTR/L 08/28/2024, 1:27 PM

## 2024-08-29 ENCOUNTER — Other Ambulatory Visit (HOSPITAL_COMMUNITY): Payer: Self-pay

## 2024-08-29 DIAGNOSIS — Z66 Do not resuscitate: Secondary | ICD-10-CM

## 2024-08-29 DIAGNOSIS — I5042 Chronic combined systolic (congestive) and diastolic (congestive) heart failure: Secondary | ICD-10-CM | POA: Diagnosis not present

## 2024-08-29 DIAGNOSIS — R4189 Other symptoms and signs involving cognitive functions and awareness: Secondary | ICD-10-CM

## 2024-08-29 DIAGNOSIS — I10 Essential (primary) hypertension: Secondary | ICD-10-CM | POA: Diagnosis not present

## 2024-08-29 DIAGNOSIS — R531 Weakness: Principal | ICD-10-CM

## 2024-08-29 DIAGNOSIS — R638 Other symptoms and signs concerning food and fluid intake: Secondary | ICD-10-CM

## 2024-08-29 DIAGNOSIS — N179 Acute kidney failure, unspecified: Secondary | ICD-10-CM | POA: Diagnosis not present

## 2024-08-29 DIAGNOSIS — I480 Paroxysmal atrial fibrillation: Secondary | ICD-10-CM | POA: Diagnosis not present

## 2024-08-29 DIAGNOSIS — Z515 Encounter for palliative care: Secondary | ICD-10-CM

## 2024-08-29 DIAGNOSIS — Z7189 Other specified counseling: Secondary | ICD-10-CM

## 2024-08-29 LAB — COMPREHENSIVE METABOLIC PANEL WITH GFR
ALT: 45 U/L — ABNORMAL HIGH (ref 0–44)
AST: 82 U/L — ABNORMAL HIGH (ref 15–41)
Albumin: 3.3 g/dL — ABNORMAL LOW (ref 3.5–5.0)
Alkaline Phosphatase: 77 U/L (ref 38–126)
Anion gap: 11 (ref 5–15)
BUN: 15 mg/dL (ref 8–23)
CO2: 28 mmol/L (ref 22–32)
Calcium: 9.5 mg/dL (ref 8.9–10.3)
Chloride: 103 mmol/L (ref 98–111)
Creatinine, Ser: 0.9 mg/dL (ref 0.61–1.24)
GFR, Estimated: >60 mL/min
Glucose, Bld: 91 mg/dL (ref 70–99)
Potassium: 3.5 mmol/L (ref 3.5–5.1)
Sodium: 142 mmol/L (ref 135–145)
Total Bilirubin: 0.5 mg/dL (ref 0.0–1.2)
Total Protein: 6.6 g/dL (ref 6.5–8.1)

## 2024-08-29 LAB — CBC
HCT: 35.4 % — ABNORMAL LOW (ref 39.0–52.0)
Hemoglobin: 11.2 g/dL — ABNORMAL LOW (ref 13.0–17.0)
MCH: 29.2 pg (ref 26.0–34.0)
MCHC: 31.6 g/dL (ref 30.0–36.0)
MCV: 92.4 fL (ref 80.0–100.0)
Platelets: 297 K/uL (ref 150–400)
RBC: 3.83 MIL/uL — ABNORMAL LOW (ref 4.22–5.81)
RDW: 15.2 % (ref 11.5–15.5)
WBC: 10 K/uL (ref 4.0–10.5)
nRBC: 0 % (ref 0.0–0.2)

## 2024-08-29 LAB — CK: Total CK: 399 U/L — ABNORMAL HIGH (ref 49–397)

## 2024-08-29 LAB — MAGNESIUM: Magnesium: 1.9 mg/dL (ref 1.7–2.4)

## 2024-08-29 MED ORDER — QUETIAPINE FUMARATE 25 MG PO TABS
12.5000 mg | ORAL_TABLET | Freq: Every day | ORAL | Status: DC
  Administered 2024-08-29 – 2024-09-01 (×4): 12.5 mg via ORAL
  Filled 2024-08-29 (×4): qty 1

## 2024-08-29 MED ORDER — SENNOSIDES-DOCUSATE SODIUM 8.6-50 MG PO TABS
2.0000 | ORAL_TABLET | Freq: Every day | ORAL | Status: DC
  Administered 2024-08-30 – 2024-09-02 (×4): 2 via ORAL
  Filled 2024-08-29 (×4): qty 2

## 2024-08-29 MED ORDER — POLYETHYLENE GLYCOL 3350 17 G PO PACK
17.0000 g | PACK | Freq: Two times a day (BID) | ORAL | Status: AC
  Administered 2024-08-29: 17 g via ORAL
  Filled 2024-08-29: qty 1

## 2024-08-29 MED ORDER — POTASSIUM CHLORIDE CRYS ER 20 MEQ PO TBCR
60.0000 meq | EXTENDED_RELEASE_TABLET | Freq: Once | ORAL | Status: AC
  Administered 2024-08-29: 60 meq via ORAL
  Filled 2024-08-29: qty 3

## 2024-08-29 MED ORDER — POLYETHYLENE GLYCOL 3350 17 G PO PACK: 17.0000 g | PACK | Freq: Two times a day (BID) | ORAL | Status: DC | PRN

## 2024-08-29 MED ORDER — LACTATED RINGERS IV BOLUS
500.0000 mL | Freq: Four times a day (QID) | INTRAVENOUS | Status: AC
  Administered 2024-08-29 (×2): 500 mL via INTRAVENOUS

## 2024-08-29 MED ORDER — MELATONIN 5 MG PO TABS
5.0000 mg | ORAL_TABLET | Freq: Every day | ORAL | Status: DC
  Administered 2024-08-29 – 2024-09-01 (×4): 5 mg via ORAL
  Filled 2024-08-29 (×4): qty 1

## 2024-08-29 NOTE — Plan of Care (Signed)
" °  Problem: Clinical Measurements: Goal: Will remain free from infection Outcome: Progressing Goal: Cardiovascular complication will be avoided Outcome: Progressing   Problem: Nutrition: Goal: Adequate nutrition will be maintained Outcome: Progressing   Problem: Elimination: Goal: Will not experience complications related to urinary retention Outcome: Progressing   Problem: Pain Managment: Goal: General experience of comfort will improve and/or be controlled Outcome: Progressing   "

## 2024-08-29 NOTE — Consult Note (Signed)
 " Consultation Note Date: 08/29/2024   Patient Name: Leon Estes  DOB: [birth year]  MRN: [id number]  Age / Sex: 89 y.o., male   PCP: Bennett Reuben POUR, MD Referring Physician: Kathrin Mignon DASEN, MD  Reason for Consultation: Establishing goals of care     Chief Complaint/History of Present Illness:   Patient is a 89 year old male with a past medical history of cognitive impairment, HFrEF, COPD, CKD, PAF not on anticoagulation, dysphagia, hypertension, and recent hospitalization from 1/6-1/07/2025 for acute metabolic encephalopathy, AKI, and dehydration with associated electrolyte abnormalities who was admitted on 08/27/2024 due to increased confusion, disorientation, and decreased oral intake at home since discharge.  Since admission, patient receiving management for delirium in the setting of underlying cognitive impairment,  generalized weakness, poor oral intake, and failure to thrive.  Palliative medicine team consulted to assist with goals of care conversations.  Reviewed EMR documentation from 08/28/2024.  Hospitalist has continued IV hydration to assist kidney function in setting of poor oral intake with recent AKI.  Patient has also had issues with agitation from delirium with known underlying cognitive impairment.  At time of EMR review in past 24 hours patient has received as needed IV Haldol  2 mg x 1 dose.  Patient cannot have agitation throughout the day though appears to have issues with sundowning in particular. SLP evaluated patient and noted patient is at risk for chronic pharyngeal dysphagia.  PT/OT also evaluated the patient and noted that patient was needing 2+ assist for care, has poor standing balance, and is a high fall risk. Discussed care with hospitalist for medical updates. Reviewed recent lab work including CK which has trended down from 1145 to 399.  Patient only noted to have oral intake of the 120 mL on 08/28/2024. Also personally reviewed CT AP from [facility] 08/27/2024 on  admission which noted mild fecal retention; no particularly acute concerns though.   Presented to bedside to see patient.  No visitors present at bedside.  Patient sleeping in bed so did not awaken.  Patient in restraining belt across his abdomen and restraints on upper extremities bilaterally. Discussed care with RN for updates as well.  Attempted to call patient's wife, Leon Estes, without answer and phone noted did not have voicemail set up.  Did able to call patient's next contact, daughter who is listed as Leon Estes in the chart.  Able to speak with this daughter who noted she actually goes by Leon Estes.  Introduced myself as a member of the palliative medicine team and by role of patient's medical journey.  Spent time learning about patient's time at home before being brought into the hospital.  Daughter noted that while she does not live with her parents she is very close to them and visits with them regularly.  She noted that patient had been improving mentally overall at home.  She noted in particular he had a very good day on Wednesday.  Unfortunately on the day he was provided, patient had worsening agitation and confusion to the point that he could not be redirected by family and so they wanted to bring him back to the hospital since the wife could not care for him at home.  Also inquired about patient's underlying memory deficits.  Daughter noted that patient has not been officially diagnosed with dementia though patient's wife has been describing aspects of dementia send noted would support that patient has mild cognitive impairment, just has not seen neurologist for official diagnosis of dementia. With this in  mind, spent time discussing the idea of delirium in the setting of patients with known cognitive impairment.  Also introduced concept of sundowning.  Discussed how patient can become incredibly agitated at times requiring medications for sedation so that he does not hurt himself.   Also discussed that is why patient is in restraints.  Normalized it is difficult as people get older to bounce back to what their baseline had been like previously after hospitalizations and acute medical illness.  Express concern that even being in the hospital will continue to worsen patient's mentation.  Also discussed patient overall not doing well in the sense that he is not having great oral intake while here.  Tried to explore what family had discussed hoping for moving forward.  Daughter noted that her mother/patient's wife has previously stated she would never want patient to go into a facility for care.  Daughter has also tried to talk with her mother about having hired care to assist with patient's care at home since patient's wife is 77 years old.  Patient's wife has been reluctant to have other people coming into their home.  Acknowledged this. Discussed based on current evaluation, family does need to consider whether they would want long-term care placement at this time.  Discussed if goal is for patient to return home, should be to focus on staying at home for patient's comfort.  Explained this would need hired caregiver to assist with managing patient particularly with his episodes of agitation.  Also introduced the idea of having a hospice support at home to help with symptom management and focusing on quality time in comfort at home.  Discussed this would provide a support system that would also allow patient to remain at home and not have to keep coming back and forth to the hospital which is worsening his mentation.  Spent time answering questions as able regarding this.  Noted this provider was unable to reach patient's wife to discuss these pathways so daughter planning to follow-up with patient's wife regarding discussion.  All questions answered at that time.  Noted palliative medicine team would continue to follow along with patient's medical journey.  Discussed care with  hospitalist and RN to coordinate care after discussion with daughter.  Primary Diagnoses  Present on Admission:  AKI (acute kidney injury)  Chronic combined systolic (congestive) and diastolic (congestive) heart failure (HCC)  PAF (paroxysmal atrial fibrillation) (HCC)  Benign essential HTN  COPD (chronic obstructive pulmonary disease) (HCC)  Hyperlipidemia   Past Medical History:  Diagnosis Date   Asthma    BPH (benign prostatic hypertrophy)    Cataract    Chronic renal disease, stage III (HCC)    Chronic sinusitis    COPD (chronic obstructive pulmonary disease) (HCC)    Coronary artery disease    2 stents   Gall stones    Hearing loss    DOES NOT WEAR HEARING AIDS   High cholesterol    Hypertension    Lactic acid acidosis 10/12/2021   Macular degeneration    right eye   Rash 12/04/2021   Septic shock (HCC) 10/10/2021   Social History   Socioeconomic History   Marital status: Married    Spouse name: Not on file   Number of children: 4   Years of education: Not on file   Highest education level: Not on file  Occupational History   Occupation: Drove truck and concrete work  Tobacco Use   Smoking status: Former    Current packs/day:  0.00    Types: Cigarettes    Quit date: 08/13/2005    Years since quitting: 19.0    Passive exposure: Past   Smokeless tobacco: Current    Types: Chew   Tobacco comments:    discussed stopping this (only once in a while)  Vaping Use   Vaping status: Never Used  Substance and Sexual Activity   Alcohol  use: No   Drug use: No   Sexual activity: Not Currently  Other Topics Concern   Not on file  Social History Narrative   No living will   Wife, then LouAnn daughter should make decisions   Would accept resuscitation   Would  not want tube feeds   Social Drivers of Health   Tobacco Use: High Risk (08/19/2024)   Patient History    Smoking Tobacco Use: Former    Smokeless Tobacco Use: Current    Passive Exposure: Past  Engineer, Maintenance Strain: Medium Risk (04/03/2024)   Received from [facility]   Overall Financial Resource Strain (CARDIA)    Difficulty of Paying Living Expenses: Somewhat hard  Food Insecurity: No Food Insecurity (08/19/2024)   Epic    Worried About Radiation Protection Practitioner of Food in the Last Year: Never true    Ran Out of Food in the Last Year: Never true  Transportation Needs: No Transportation Needs (08/19/2024)   Epic    Lack of Transportation (Medical): No    Lack of Transportation (Non-Medical): No  Physical Activity: Not on file  Stress: Not on file  Social Connections: Moderately Isolated (08/19/2024)   Social Connection and Isolation Panel    Frequency of Communication with Friends and Family: More than three times a week    Frequency of Social Gatherings with Friends and Family: Three times a week    Attends Religious Services: Never    Active Member of Clubs or Organizations: No    Attends Banker Meetings: Never    Marital Status: Married  Depression (PHQ2-9): Low Risk (04/09/2024)   Depression (PHQ2-9)    PHQ-2 Score: 1  Alcohol  Screen: Not on file  Housing: Low Risk (08/19/2024)   Epic    Unable to Pay for Housing in the Last Year: No    Number of Times Moved in the Last Year: 0    Homeless in the Last Year: No  Utilities: Not At Risk (08/19/2024)   Epic    Threatened with loss of utilities: No  Health Literacy: Not on file   Family History  Problem Relation Age of Onset   Diabetes Father    Heart disease Brother    Scheduled Meds:  arformoterol   15 mcg Nebulization BID   budesonide   0.5 mg Nebulization BID   carvedilol   6.25 mg Oral BID WC   heparin   5,000 Units Subcutaneous Q8H   pantoprazole   40 mg Oral Daily   QUEtiapine   25 mg Oral QHS   revefenacin   175 mcg Nebulization Daily   senna-docusate  1 tablet Oral Daily   sodium chloride  flush  3 mL Intravenous Q12H   tamsulosin   0.4 mg Oral Daily   Continuous Infusions: PRN Meds:.acetaminophen   **OR** acetaminophen , haloperidol  lactate, hydrALAZINE , ipratropium-albuterol , ondansetron  **OR** ondansetron  (ZOFRAN ) IV, polyethylene glycol, sodium chloride  flush Allergies[1] CBC:    Component Value Date/Time   WBC 10.0 08/29/2024 0438   HGB 11.2 (L) 08/29/2024 0438   HGB 9.4 (L) 09/12/2021 1611   HCT 35.4 (L) 08/29/2024 0438   HCT 28.7 (L) 09/12/2021  1611   PLT 297 08/29/2024 0438   PLT 234 09/12/2021 1611   MCV 92.4 08/29/2024 0438   MCV 81 09/12/2021 1611   MCV 87 04/29/2013 1543   NEUTROABS 10.8 (H) 08/18/2024 2031   LYMPHSABS 2.8 08/18/2024 2031   MONOABS 2.0 (H) 08/18/2024 2031   EOSABS 0.0 08/18/2024 2031   BASOSABS 0.1 08/18/2024 2031   Comprehensive Metabolic Panel:    Component Value Date/Time   NA 142 08/29/2024 0438   NA 135 (L) 04/29/2013 1543   K 3.5 08/29/2024 0438   K 3.3 (L) 04/29/2013 1543   CL 103 08/29/2024 0438   CL 102 04/29/2013 1543   CO2 28 08/29/2024 0438   CO2 27 04/29/2013 1543   BUN 15 08/29/2024 0438   BUN 17 04/29/2013 1543   CREATININE 0.90 08/29/2024 0438   CREATININE 2.47 (H) 09/09/2020 1343   GLUCOSE 91 08/29/2024 0438   GLUCOSE 120 (H) 04/29/2013 1543   CALCIUM  9.5 08/29/2024 0438   CALCIUM  9.8 04/29/2013 1543   AST 82 (H) 08/29/2024 0438   AST 30 04/29/2013 1543   ALT 45 (H) 08/29/2024 0438   ALT 24 04/29/2013 1543   ALKPHOS 77 08/29/2024 0438   ALKPHOS 79 04/29/2013 1543   BILITOT 0.5 08/29/2024 0438   BILITOT 0.4 04/29/2013 1543   PROT 6.6 08/29/2024 0438   PROT 7.9 04/29/2013 1543   ALBUMIN  3.3 (L) 08/29/2024 0438   ALBUMIN  3.8 04/29/2013 1543    Physical Exam: Vital Signs: BP (!) 163/87 (BP Location: Right Arm)   Pulse 79   Temp 98.5 F (36.9 C)   Resp 20   Ht 5' 8 (1.727 m)   Wt 65.8 kg   SpO2 97%   BMI 22.06 kg/m  SpO2: SpO2: 97 % O2 Device: O2 Device: Room Air O2 Flow Rate: O2 Flow Rate (L/min): 2 L/min Intake/output summary:  Intake/Output Summary (Last 24 hours) at 08/29/2024 9282 Last data filed  at 08/29/2024 0444 Gross per 24 hour  Intake 120 ml  Output 1100 ml  Net -980 ml   LBM: Last BM Date :  (UTA) Baseline Weight: Weight: 68.5 kg Most recent weight: Weight: 65.8 kg  General: Sleeping, chronically ill-appearing, frail Cardiovascular: RRR Respiratory: no increased work of breathing noted, not in respiratory distress Abdomen: not distended, restraint belt across abdomen Extremities: Upper extremities in restraints bilaterally          Palliative Performance Scale: 20%              Additional Data Reviewed: Recent Labs    08/28/24 0447 08/29/24 0438  WBC 11.9* 10.0  HGB 12.1* 11.2*  PLT 295 297  NA 143 142  BUN 20 15  CREATININE 0.99 0.90    Imaging: CT ABDOMEN PELVIS W CONTRAST CLINICAL DATA:  Abdominal pain and increasing weakness.  EXAM: CT ABDOMEN AND PELVIS WITH CONTRAST  TECHNIQUE: Multidetector CT imaging of the abdomen and pelvis was performed using the standard protocol following bolus administration of intravenous contrast.  RADIATION DOSE REDUCTION: This exam was performed according to the departmental dose-optimization program which includes automated exposure control, adjustment of the mA and/or kV according to patient size and/or use of iterative reconstruction technique.  CONTRAST:  OMNIPAQUE  IOHEXOL  300 MG/ML  SOLN  COMPARISON:  02/16/2023  FINDINGS: Lower chest: Mild cardiomegaly. Calcified plaque over the right coronary artery as well as descending thoracic aorta. Lung bases demonstrate dependent atelectasis posterior left base. 9 x 9 mm nodule with possible linear associated density  as this may represent scarring versus true nodule. This is larger compared to the prior study. Heterogeneous opacification over the right lower lobe which may be due to atelectasis although pneumonia is possible.  Hepatobiliary: Gallbladder is contracted. Mild diffuse low-attenuation of the liver compared to the spleen compatible with a  mild degree of steatosis. No focal liver mass. Biliary tree is unremarkable.  Pancreas: Normal.  Spleen: Normal.  Adrenals/Urinary Tract: Adrenal glands are unremarkable. Kidneys are normal in size. Stable 3.1 cm cyst over the upper pole left kidney. No hydronephrosis or nephrolithiasis. Ureters and bladder are normal.  Stomach/Bowel: Stomach is normal. Possible diverticula over the proximal duodenum unchanged. Small bowel is otherwise unremarkable. Prior appendectomy. Mild diverticulosis of the colon. Mild fecal retention over the rectosigmoid colon.  Vascular/Lymphatic: Moderate calcified plaque over the abdominal aorta. Aorta is normal caliber. Remaining vascular structures are unremarkable. No adenopathy.  Reproductive: Moderate prostatic enlargement measuring 5.1 x 6.7 x 6.2 cm.  Other: No free fluid or focal inflammatory change.  Musculoskeletal: Degenerative change of the spine with multilevel disc disease over the lumbar spine from the L2-3 level to the L5-S1 level. Grade 1-2 anterolisthesis of L5 on S1 unchanged.  IMPRESSION: 1. No acute findings in the abdomen/pelvis. 2. Heterogeneous opacification over the right lower lobe which may be due to atelectasis, although pneumonia is possible. 3. Mild diverticulosis of the colon. Mild fecal retention over the rectosigmoid colon. 4. Mild hepatic steatosis. 5. Stable 3.1 cm left renal cyst. 6. Moderate prostatic enlargement. 7. 9 x 9 mm nodule with possible linear associated density as this may represent scarring versus true nodule. This is larger compared to the prior study. Recommend noncontrast chest CT for complete evaluation of the lungs on elective basis. 8. Aortic atherosclerosis. Atherosclerotic coronary artery disease.  Aortic Atherosclerosis (ICD10-I70.0).  Electronically Signed   By: Toribio Agreste M.D.   On: 08/27/2024 12:58 DG Chest 2 View CLINICAL DATA:  Increasing weakness.  EXAM: CHEST - 2  VIEW  COMPARISON:  08/21/2024  FINDINGS: Lungs are adequately inflated. Mild linear density over the bilateral lung bases likely atelectasis. No effusion. Cardiomediastinal silhouette and remainder the exam is unchanged.  IMPRESSION: Mild bibasilar atelectasis.  Electronically Signed   By: Toribio Agreste M.D.   On: 08/27/2024 10:20    I personally reviewed recent imaging.   Palliative Care Assessment and Plan Summary of Established Goals of Care and Medical Treatment Preferences   Patient is a 89 year old male with a past medical history of cognitive impairment, HFrEF, COPD, CKD, PAF not on anticoagulation, dysphagia, hypertension, and recent hospitalization from 1/6-1/07/2025 for acute metabolic encephalopathy, AKI, and dehydration with associated electrolyte abnormalities who was admitted on 08/27/2024 due to increased confusion, disorientation, and decreased oral intake at home since discharge.  Since admission, patient receiving management for delirium in the setting of underlying cognitive impairment,  generalized weakness, poor oral intake, and failure to thrive.  Palliative medicine team consulted to assist with goals of care conversations.  # Complex medical decision making/goals of care  - Patient unable to participate in complex medical decision making due to underlying medical status.  - Attempted to call patient's wife and was unable to reach her.  Spoke with patient's daughter Leon Estes (noted to be Leon Estes in the chart but stated Leon Estes Smith) over the phone as detailed above in HPI.  Spent time discussing patient's overall medical state in the setting of delirium with known underlying cognitive impairment.  Also discussed patient's continued agitation and lack  of oral intake.  Encouraged conversation with family to determine appropriate pathway for medical care moving forward.  Discussed at this time patient likely needs either long-term care placement or to return home with  24/7 caregivers and hospice support.  Discussed how continued rehospitalizations will continue to worsen patient's delirium.  Daughter planning on talking with her mother and other family members about what pathway for care they would like to pursue.  At this time patient already appropriately DNR/DNI.  Continuing appropriate medical interventions.  Palliative medicine team continuing to follow with patient's medical journey.   -  Code Status: Limited: Do not attempt resuscitation (DNR) -DNR-LIMITED -Do Not Intubate/DNI    # Psycho-social/Spiritual Support:  - Support System: Patient's wife, 2 daughters, 2 sons  # Discharge Planning:  To Be Determined  Thank you for allowing the palliative care team to participate in the care Leon Estes.  Leon Radar, DO Palliative Care Provider PMT # [phone]  If patient remains symptomatic despite maximum doses, please call PMT at [phone] between 0700 and 1900. Outside of these hours, please call attending, as PMT does not have night coverage.  Billing based on MDM: High   Problems Addressed: One or more chronic illnesses with severe exacerbation, progression, or side effects of treatment.  Amount and/or Complexity of Data: Category 1:Review of prior external note(s) from each unique source, Review of the result(s) of each unique test, and Assessment requiring an independent historian(s) and Category 3:Discussion of management or test interpretation with external physician/other qualified health care professional/appropriate source (not separately reported)     [1]  Allergies Allergen Reactions   Augmentin  [Amoxicillin -Pot Clavulanate] Rash   Bacitracin-Polymyxin B Swelling and Rash   Benzalkonium Chloride     Benzalkonium chloride is a quaternary ammonium antiseptic and disinfectant with actions and uses similar to those of other cationic surfactants. It is also used as an antimicrobial preservative for pharmaceutical products.    Neosporin [Neomycin-Bacitracin Zn-Polymyx] Rash   Sulfamethoxazole -Trimethoprim  Rash   Terbinafine And Related Rash   "

## 2024-08-29 NOTE — Progress Notes (Signed)
 " PROGRESS NOTE  Leon Estes FMW:[id number] DOB: 07-11-1931   PCP: Bennett Reuben POUR, MD  Patient is from: Home.  Lives with family.  DOA: 08/27/2024 LOS: 1  Chief complaints Chief Complaint  Patient presents with   Weakness     Brief Narrative / Interim history: 89 year old M with PMH of cognitive impairment, HFmrEF, COPD, CKD, PAF not on anticoagulation, dysphagia, HTN and recent hospitalization from 1/6-1/12 for acute metabolic encephalopathy, AKI, dehydration and electrolyte abnormalities return to the hospital due to increased confusion, disorientation and decreased oral intake for the last 3 to 4 days.  Spouse unable to care for patient and they brought patient back.  In ED, slightly tachypneic.  Other vital stable.  COVID-19, influenza and RSV PCR nonreactive.  UA does not suggest UTI. Cr 1.3 (baseline 0.8).  WBC 14.1.  Lactic acid 1.6.  CXR with some atelectasis.  CT abdomen and pelvis without acute finding but showed RLL atelectasis, hepatic steatosis, stable 3.1 left renal cyst, moderate prostate enlargement, 9 x 9 mm lung nodule and aortic sclerosis.  Patient was admitted with working diagnosis of  physical and mental decline with encephalopathy , generalized weakness.  On further discussion with patient's daughter and wife, patient with no formal diagnosis of dementia.  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  However, he tends to ask same question repeatedly. He significantly declined after they put down their dog on 12/31.  Subjective: Seen and examined earlier this morning.  Patient remains confused and disoriented requiring bilateral wrist restraints and as needed meds.  He wakes to void this morning.  Able to say his name but does not follow commands.  His speech is very difficult to understand.  Assessment and plan: Delirium with possible mild underlying cognitive impairment: Pretty functional as of last months until they  put down their dog on 12/31.  Encephalopathy workup on 1/7 including MRI brain, B12, RPR, TSH and ammonia unrevealing.  He is currently confused requiring pharmacologic and soft restraints.  He wakes to voice this morning. -Will give LR bolus 500 cc every 6 hours x 2 -Decrease night Seroquel  to 12.5 mg and add melatonin -Reorientation and delirium precaution -Continue IV Haldol  as needed. -Try him off soft restraints daily -Discussed with wife and daughter about the importance of family presence in room -Palliative medicine consulted and following.   Acute kidney injury: Likely due to dehydration in the setting of poor p.o. intake.  Resolving. Recent Labs    08/18/24 2031 08/19/24 0502 08/20/24 0432 08/21/24 0349 08/22/24 0335 08/23/24 0316 08/24/24 0415 08/27/24 0950 08/28/24 0447 08/29/24 0438  BUN 11 14 11 15 18 21 17  31* 20 15  CREATININE 1.41* 1.16 0.97 0.91 1.07 1.10 0.83 1.35* 0.99 0.90  - IV fluid as above. - Avoid diuretics due to risk of dehydration    Generalized weakness, failure to thrive: Recurrent hospitalization due to delirium and poor p.o. intake.  Seems to have advanced dementia.  Appropriately DNR. -Therapy recommended SNF -Palliative consult for further goal of care discussion   Paroxysmal atrial fibrillation: Rate controlled.  Not on anticoagulation. -Coreg  as above.   History of hypertension: BP elevated  but difficult to get accurate measurement due to patient's delirium. -Increased Coreg  to 6.25 mg twice daily -Continue holding losartan  -Avoid diuretics due to risk of dehydration  Chronic HFmrEF: TTE in 2023 with LVEF of 45 to 50%, GH, G1 DD, severe LAE.  Appears euvolemic on exam. -  Monitor fluid and respiratory status while on IV fluid  Chronic COPD: Stable. - Optimize nebulizers and discontinue oxygen.  Mild leukocytosis: Likely reactive/demargination.  Low suspicion for infection.  Improved without antibiotics.  Mild rhabdomyolysis: CK  1100>> 400. - IV fluid as above.  Hyperlipidemia -Hold statin due to rhabdomyolysis.  Elevated AST: Likely due to rhabdo - Continue monitoring  Pulmonary nodule: CT showed 9 x 9 mm RLL nodule.  Radiology recommended noncontrast CT chest for complete evaluation of lung disease on elective basis.  Given his age and comorbidity, I doubt there is any utility of CT chest.  BPH: CT showed moderate prostatic enlargement. - Continue home Flomax  - Bladder scan as needed. - Strict intake and output  Body mass index is 22.06 kg/m.         Pressure skin injury: Present on admission. Wound 08/27/24 2100 Pressure Injury Buttocks Right;Left Stage 1 -  Intact skin with non-blanchable redness of a localized area usually over a bony prominence. (Active)   DVT prophylaxis:  heparin  injection 5,000 Units Start: 08/27/24 2200  Code Status: DNR Family Communication: Updated patient's wife and daughter at bedside. Level of care: Med-Surg Status is: Inpatient The patient will remain inpatient because: Dementia with behavioral disturbance/delirium, failure to thrive, generalized weakness   Final disposition: SNF   55 minutes with more than 50% spent in reviewing records, counseling patient/family and coordinating care.  Consultants:  Palliative medicine  Procedures: None  Microbiology summarized: COVID-19, influenza and RSV PCR nonreactive  Objective: Vitals:   08/29/24 0812 08/29/24 0813 08/29/24 0942 08/29/24 1448  BP:   (!) 168/77 (!) 144/70  Pulse:   70 80  Resp:    16  Temp:    97.8 F (36.6 C)  TempSrc:    Oral  SpO2: 96% 97%  96%  Weight:      Height:        Examination:  GENERAL: No apparent distress.  Nontoxic. HEENT: MMM.  Vision and hearing grossly intact.  NECK: Supple.  No apparent JVD.  RESP:  No IWOB.  Fair aeration bilaterally. CVS:  RRR. Heart sounds normal.  ABD/GI/GU: BS+. Abd soft, NTND.  MSK/EXT:  Moves extremities. No apparent deformity. No edema.   Bilateral wrist restraints. SKIN: no apparent skin lesion or wound NEURO: Sleepy but wakes to voice.  Oriented to self.  Does not follow commands.   No apparent focal neuro deficit.  Sch Meds:  Scheduled Meds:  arformoterol   15 mcg Nebulization BID   budesonide   0.5 mg Nebulization BID   carvedilol   6.25 mg Oral BID WC   heparin   5,000 Units Subcutaneous Q8H   pantoprazole   40 mg Oral Daily   polyethylene glycol  17 g Oral BID   QUEtiapine   25 mg Oral QHS   revefenacin   175 mcg Nebulization Daily   [START ON 08/30/2024] senna-docusate  2 tablet Oral Daily   sodium chloride  flush  3 mL Intravenous Q12H   tamsulosin   0.4 mg Oral Daily   Continuous Infusions:   PRN Meds:.acetaminophen  **OR** acetaminophen , haloperidol  lactate, hydrALAZINE , ipratropium-albuterol , ondansetron  **OR** ondansetron  (ZOFRAN ) IV, polyethylene glycol **FOLLOWED BY** [START ON 08/30/2024] polyethylene glycol, sodium chloride  flush  Antimicrobials: Anti-infectives (From admission, onward)    None        I have personally reviewed the following labs and images: CBC: Recent Labs  Lab 08/27/24 0950 08/28/24 0447 08/29/24 0438  WBC 14.1* 11.9* 10.0  HGB 11.7* 12.1* 11.2*  HCT 37.1* 38.1* 35.4*  MCV 94.4  92.9 92.4  PLT 316 295 297   BMP &GFR Recent Labs  Lab 08/23/24 0316 08/24/24 0415 08/27/24 0950 08/28/24 0447 08/29/24 0438  NA 149* 140 141 143 142  K 3.8 2.5* 3.9 4.0 3.5  CL 106 102 104 105 103  CO2 24 26 28 27 28   GLUCOSE 127* 132* 121* 97 91  BUN 21 17 31* 20 15  CREATININE 1.10 0.83 1.35* 0.99 0.90  CALCIUM  9.6 8.9 9.4 9.8 9.5  MG  --  1.8  --  2.2 1.9  PHOS  --  1.5*  --   --   --    Estimated Creatinine Clearance: 49.8 mL/min (by C-G formula based on SCr of 0.9 mg/dL). Liver & Pancreas: Recent Labs  Lab 08/27/24 0950 08/28/24 0447 08/29/24 0438  AST 81* 99* 82*  ALT 45* 47* 45*  ALKPHOS 71 81 77  BILITOT 0.3 0.4 0.5  PROT 6.7 7.3 6.6  ALBUMIN  3.3* 3.5 3.3*   No  results for input(s): LIPASE, AMYLASE in the last 168 hours. No results for input(s): AMMONIA in the last 168 hours. Diabetic: No results for input(s): HGBA1C in the last 72 hours. Recent Labs  Lab 08/27/24 1030  GLUCAP 97   Cardiac Enzymes: Recent Labs  Lab 08/28/24 0447 08/29/24 0438  CKTOTAL 1,145* 399*   No results for input(s): PROBNP in the last 8760 hours. Coagulation Profile: No results for input(s): INR, PROTIME in the last 168 hours. Thyroid  Function Tests: Recent Labs    08/28/24 0447  TSH 1.080   Lipid Profile: No results for input(s): CHOL, HDL, LDLCALC, TRIG, CHOLHDL, LDLDIRECT in the last 72 hours. Anemia Panel: No results for input(s): VITAMINB12, FOLATE, FERRITIN, TIBC, IRON, RETICCTPCT in the last 72 hours. Urine analysis:    Component Value Date/Time   COLORURINE YELLOW 08/27/2024 1054   APPEARANCEUR CLEAR 08/27/2024 1054   APPEARANCEUR Clear 04/29/2013 1543   LABSPEC 1.026 08/27/2024 1054   LABSPEC 1.020 04/29/2013 1543   PHURINE 5.0 08/27/2024 1054   GLUCOSEU NEGATIVE 08/27/2024 1054   GLUCOSEU Negative 04/29/2013 1543   HGBUR SMALL (A) 08/27/2024 1054   BILIRUBINUR NEGATIVE 08/27/2024 1054   BILIRUBINUR Negative 03/14/2022 1506   BILIRUBINUR Negative 04/29/2013 1543   KETONESUR NEGATIVE 08/27/2024 1054   PROTEINUR 100 (A) 08/27/2024 1054   UROBILINOGEN 0.2 03/14/2022 1506   UROBILINOGEN 0.2 03/08/2012 1624   NITRITE NEGATIVE 08/27/2024 1054   LEUKOCYTESUR NEGATIVE 08/27/2024 1054   LEUKOCYTESUR Negative 04/29/2013 1543   Sepsis Labs: Invalid input(s): PROCALCITONIN, LACTICIDVEN  Microbiology: Recent Results (from the past 240 hours)  Urine Culture (for pregnant, neutropenic or urologic patients or patients with an indwelling urinary catheter)     Status: None   Collection Time: 08/22/24  7:05 AM   Specimen: Urine, Clean Catch  Result Value Ref Range Status   Specimen Description URINE, CLEAN  CATCH  Final   Special Requests NONE  Final   Culture   Final    NO GROWTH Performed at [facility], [address], [place], [place] [address]    Report Status 08/23/2024 FINAL  Final  Resp panel by RT-PCR (RSV, Flu A&B, Covid) Anterior Nasal Swab     Status: None   Collection Time: 08/27/24 12:40 PM   Specimen: Anterior Nasal Swab  Result Value Ref Range Status   SARS Coronavirus 2 by RT PCR NEGATIVE NEGATIVE Final    Comment: (NOTE) SARS-CoV-2 target nucleic acids are NOT DETECTED.  The SARS-CoV-2 RNA is generally detectable  in upper respiratory specimens during the acute phase of infection. The lowest concentration of SARS-CoV-2 viral copies this assay can detect is 138 copies/mL. A negative result does not preclude SARS-Cov-2 infection and should not be used as the sole basis for treatment or other patient management decisions. A negative result may occur with  improper specimen collection/handling, submission of specimen other than nasopharyngeal swab, presence of viral mutation(s) within the areas targeted by this assay, and inadequate number of viral copies(<138 copies/mL). A negative result must be combined with clinical observations, patient history, and epidemiological information. The expected result is Negative.  Fact Sheet for Patients:  bloggercourse.com  Fact Sheet for Healthcare Providers:  seriousbroker.it  This test is no t yet approved or cleared by the [place]  FDA and  has been authorized for detection and/or diagnosis of SARS-CoV-2 by FDA under an Emergency Use Authorization (EUA). This EUA will remain  in effect (meaning this test can be used) for the duration of the COVID-19 declaration under Section 564(b)(1) of the Act, 21 U.S.C.section 360bbb-3(b)(1), unless the authorization is terminated  or revoked sooner.       Influenza A by PCR NEGATIVE NEGATIVE Final   Influenza B by PCR  NEGATIVE NEGATIVE Final    Comment: (NOTE) The Xpert Xpress SARS-CoV-2/FLU/RSV plus assay is intended as an aid in the diagnosis of influenza from Nasopharyngeal swab specimens and should not be used as a sole basis for treatment. Nasal washings and aspirates are unacceptable for Xpert Xpress SARS-CoV-2/FLU/RSV testing.  Fact Sheet for Patients: bloggercourse.com  Fact Sheet for Healthcare Providers: seriousbroker.it  This test is not yet approved or cleared by the [place]  FDA and has been authorized for detection and/or diagnosis of SARS-CoV-2 by FDA under an Emergency Use Authorization (EUA). This EUA will remain in effect (meaning this test can be used) for the duration of the COVID-19 declaration under Section 564(b)(1) of the Act, 21 U.S.C. section 360bbb-3(b)(1), unless the authorization is terminated or revoked.     Resp Syncytial Virus by PCR NEGATIVE NEGATIVE Final    Comment: (NOTE) Fact Sheet for Patients: bloggercourse.com  Fact Sheet for Healthcare Providers: seriousbroker.it  This test is not yet approved or cleared by the [place]  FDA and has been authorized for detection and/or diagnosis of SARS-CoV-2 by FDA under an Emergency Use Authorization (EUA). This EUA will remain in effect (meaning this test can be used) for the duration of the COVID-19 declaration under Section 564(b)(1) of the Act, 21 U.S.C. section 360bbb-3(b)(1), unless the authorization is terminated or revoked.  Performed at [facility], [address], [place], [place] [address]     Radiology Studies: No results found.     Olin Gurski T. Jocilyn Trego [facility]  If 7PM-7AM, please contact night-coverage [email or web address] 08/29/2024, 4:08 PM   "

## 2024-08-29 NOTE — Plan of Care (Signed)

## 2024-08-30 ENCOUNTER — Other Ambulatory Visit (HOSPITAL_COMMUNITY): Payer: Self-pay

## 2024-08-30 DIAGNOSIS — I5042 Chronic combined systolic (congestive) and diastolic (congestive) heart failure: Secondary | ICD-10-CM | POA: Diagnosis not present

## 2024-08-30 DIAGNOSIS — I480 Paroxysmal atrial fibrillation: Secondary | ICD-10-CM | POA: Diagnosis not present

## 2024-08-30 DIAGNOSIS — R410 Disorientation, unspecified: Secondary | ICD-10-CM

## 2024-08-30 DIAGNOSIS — I10 Essential (primary) hypertension: Secondary | ICD-10-CM | POA: Diagnosis not present

## 2024-08-30 DIAGNOSIS — N179 Acute kidney failure, unspecified: Secondary | ICD-10-CM | POA: Diagnosis not present

## 2024-08-30 LAB — CBC
HCT: 33 % — ABNORMAL LOW (ref 39.0–52.0)
Hemoglobin: 10.6 g/dL — ABNORMAL LOW (ref 13.0–17.0)
MCH: 29.6 pg (ref 26.0–34.0)
MCHC: 32.1 g/dL (ref 30.0–36.0)
MCV: 92.2 fL (ref 80.0–100.0)
Platelets: 269 K/uL (ref 150–400)
RBC: 3.58 MIL/uL — ABNORMAL LOW (ref 4.22–5.81)
RDW: 15.1 % (ref 11.5–15.5)
WBC: 7.3 K/uL (ref 4.0–10.5)
nRBC: 0 % (ref 0.0–0.2)

## 2024-08-30 LAB — RENAL FUNCTION PANEL
Albumin: 3 g/dL — ABNORMAL LOW (ref 3.5–5.0)
Anion gap: 12 (ref 5–15)
BUN: 16 mg/dL (ref 8–23)
CO2: 25 mmol/L (ref 22–32)
Calcium: 9.4 mg/dL (ref 8.9–10.3)
Chloride: 106 mmol/L (ref 98–111)
Creatinine, Ser: 0.99 mg/dL (ref 0.61–1.24)
GFR, Estimated: >60 mL/min
Glucose, Bld: 86 mg/dL (ref 70–99)
Phosphorus: 2 mg/dL — ABNORMAL LOW (ref 2.5–4.6)
Potassium: 4.4 mmol/L (ref 3.5–5.1)
Sodium: 143 mmol/L (ref 135–145)

## 2024-08-30 LAB — MAGNESIUM: Magnesium: 1.8 mg/dL (ref 1.7–2.4)

## 2024-08-30 LAB — CK: Total CK: 161 U/L (ref 49–397)

## 2024-08-30 MED ORDER — LACTATED RINGERS IV BOLUS
500.0000 mL | Freq: Four times a day (QID) | INTRAVENOUS | Status: AC
  Administered 2024-08-30 (×2): 500 mL via INTRAVENOUS

## 2024-08-30 MED ORDER — HALOPERIDOL LACTATE 5 MG/ML IJ SOLN
2.0000 mg | Freq: Four times a day (QID) | INTRAMUSCULAR | Status: DC | PRN
  Administered 2024-08-31: 2 mg via INTRAVENOUS
  Filled 2024-08-30: qty 1

## 2024-08-30 MED ORDER — BUDESONIDE 0.5 MG/2ML IN SUSP
0.5000 mg | Freq: Two times a day (BID) | RESPIRATORY_TRACT | 0 refills | Status: AC
  Filled 2024-08-30: qty 120, 30d supply, fill #0

## 2024-08-30 MED ORDER — SENNOSIDES-DOCUSATE SODIUM 8.6-50 MG PO TABS: 1.0000 | ORAL_TABLET | Freq: Two times a day (BID) | ORAL | Status: AC | PRN

## 2024-08-30 MED ORDER — CARVEDILOL 6.25 MG PO TABS
6.2500 mg | ORAL_TABLET | Freq: Two times a day (BID) | ORAL | 0 refills | Status: AC
  Filled 2024-08-30: qty 180, 90d supply, fill #0

## 2024-08-30 MED ORDER — ARFORMOTEROL TARTRATE 15 MCG/2ML IN NEBU
15.0000 ug | INHALATION_SOLUTION | Freq: Two times a day (BID) | RESPIRATORY_TRACT | 0 refills | Status: AC
  Filled 2024-08-30: qty 120, 30d supply, fill #0

## 2024-08-30 MED ORDER — POLYETHYLENE GLYCOL 3350 17 GM/SCOOP PO POWD
17.0000 g | Freq: Two times a day (BID) | ORAL | 1 refills | Status: AC | PRN
  Filled 2024-08-30: qty 476, 14d supply, fill #0
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
## 2024-08-30 NOTE — Progress Notes (Signed)
 " PROGRESS NOTE  Leon Estes FMW:[id number] DOB: 24-Nov-1930   PCP: Bennett Reuben POUR, MD  Patient is from: Home.  Lives with family.  DOA: 08/27/2024 LOS: 2  Chief complaints Chief Complaint  Patient presents with   Weakness     Brief Narrative / Interim history: 89 year old M with PMH of cognitive impairment, HFmrEF, COPD, CKD, PAF not on anticoagulation, dysphagia, HTN and recent hospitalization from 1/6-1/12 for acute metabolic encephalopathy, AKI, dehydration and electrolyte abnormalities return to the hospital due to increased confusion, disorientation and decreased oral intake for the last 3 to 4 days.  Spouse unable to care for patient and they brought patient back.  In ED, slightly tachypneic.  Other vital stable.  COVID-19, influenza and RSV PCR nonreactive.  UA does not suggest UTI. Cr 1.3 (baseline 0.8).  WBC 14.1.  Lactic acid 1.6.  CXR with some atelectasis.  CT abdomen and pelvis without acute finding but showed RLL atelectasis, hepatic steatosis, stable 3.1 left renal cyst, moderate prostate enlargement, 9 x 9 mm lung nodule and aortic sclerosis.  Patient was admitted with working diagnosis of  physical and mental decline with encephalopathy , generalized weakness.  On further discussion with patient's daughter and wife, patient with no formal diagnosis of dementia.  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  However, he tends to ask same question repeatedly. He significantly declined after they put down their dog on 12/31.  Subjective: Seen and examined earlier this morning.  Patient did well of soft restraints until yesterday evening but had some confusion, agitation and delirium requiring soft restraints last night.  He was awake and alert and appears calm when I saw him this morning but he spit up his medication and agitated when the nurse tried to give him medications later in the morning.   His daughter was at bedside  when I went back to check on him about noon.  She says he took his medication from her and ate his breakfast.  He appears calm.  We have discussed disposition.  Patients with delirium do better in familiar environment with familiar faces.  His daughter is in agreement.  She says she will discuss with the rest of the family and let us  know later today.  Assessment and plan: Delirium with possible mild underlying cognitive impairment: Pretty functional as of last months until they put down their dog on 12/31.  Encephalopathy workup on 1/7 including MRI brain, B12, RPR, TSH and ammonia unrevealing.  He is currently confused requiring pharmacologic and soft restraints.  Currently awake and alert but only oriented to self.  My concern is that his delirium might not get better in the hospital. -LR bolus 500 cc every 6 hours x 2 -Decreased night Seroquel  to 12.5 mg and added melatonin on 1/17. -Reorientation and delirium precaution -Continue IV Haldol  as needed. -Try him off soft restraints daily -Appreciate help by palliative. -I think patient would do better at home if his wife can get help taking care of him.  I discussed this with one of his daughters today.  She will discuss this with the rest of the family and let us  know.   Acute kidney injury: Likely due to dehydration in the setting of poor p.o. intake.  b/l Cr ~0.9.  AKI resolved. Recent Labs    08/19/24 0502 08/20/24 9567 08/21/24 0349 08/22/24 0335 08/23/24 0316 08/24/24 0415 08/27/24 0950 08/28/24 0447 08/29/24 0438 08/30/24 0431  BUN 14 11  15 18 21 17  31* 20 15 16   CREATININE 1.16 0.97 0.91 1.07 1.10 0.83 1.35* 0.99 0.90 0.99  - IV fluid intermittently. - Avoid diuretics due to risk of dehydration    Generalized weakness, failure to thrive: Recurrent hospitalization due to delirium and poor p.o. intake.  Seems to have advanced dementia.  Appropriately DNR. -Therapy recommended SNF -Palliative consult for further goal of care  discussion   Paroxysmal atrial fibrillation: Rate controlled.  Not on anticoagulation. -Coreg  as above.   History of hypertension: BP elevated  but difficult to get accurate measurement due to patient's delirium. -Increased Coreg  to 6.25 mg twice daily -Continue holding losartan  -Avoid diuretics due to risk of dehydration  Chronic HFmrEF: TTE in 2023 with LVEF of 45 to 50%, GH, G1 DD, severe LAE.  Appears euvolemic on exam. - Monitor fluid and respiratory status while on IV fluid  Chronic COPD: Stable. - Optimize nebulizers and discontinue oxygen.  Mild leukocytosis: Likely reactive/demargination.  Low suspicion for infection.  Improved without antibiotics.  Mild rhabdomyolysis: CK 1100>> 400. - IV fluid as above.  Hyperlipidemia -Hold statin due to rhabdomyolysis.  Elevated AST: Likely due to rhabdo - Continue monitoring  Pulmonary nodule: CT showed 9 x 9 mm RLL nodule.  Radiology recommended noncontrast CT chest for complete evaluation of lung disease on elective basis.  Given his age and comorbidity, I doubt there is any utility of CT chest.  BPH: CT showed moderate prostatic enlargement. - Continue home Flomax  - Bladder scan as needed. - Strict intake and output  Body mass index is 22.16 kg/m.         Pressure skin injury: Present on admission. Wound 08/27/24 2100 Pressure Injury Buttocks Right;Left Stage 1 -  Intact skin with non-blanchable redness of a localized area usually over a bony prominence. (Active)   DVT prophylaxis:  heparin  injection 5,000 Units Start: 08/27/24 2200  Code Status: DNR Family Communication: Updated one of patient's daughter at bedside Level of care: Med-Surg Status is: Inpatient The patient will remain inpatient because: Dementia with behavioral disturbance/delirium, failure to thrive, generalized weakness   Final disposition: SNF   55 minutes with more than 50% spent in reviewing records, counseling patient/family and  coordinating care.  Consultants:  Palliative medicine  Procedures: None  Microbiology summarized: COVID-19, influenza and RSV PCR nonreactive  Objective: Vitals:   08/29/24 2119 08/30/24 0459 08/30/24 0500 08/30/24 0732  BP:  (!) 154/65  (!) 149/64  Pulse:  60  69  Resp:  18  18  Temp:  98.1 F (36.7 C)  98.9 F (37.2 C)  TempSrc:  Oral  Oral  SpO2: 99% 97%  97%  Weight:   66.1 kg   Height:        Examination:  GENERAL: No apparent distress.  Nontoxic.  Appears calm. HEENT: MMM.  Vision and hearing grossly intact.  NECK: Supple.  No apparent JVD.  RESP:  No IWOB.  Fair aeration bilaterally. CVS:  RRR. Heart sounds normal.  ABD/GI/GU: BS+. Abd soft, NTND.  MSK/EXT:  Moves extremities. No apparent deformity. No edema.  Bilateral wrist restraints. SKIN: no apparent skin lesion or wound NEURO: Awake and alert.  Oriented to self and his daughter.  Follows commands.  No apparent focal neuro deficit.  Sch Meds:  Scheduled Meds:  arformoterol   15 mcg Nebulization BID   budesonide   0.5 mg Nebulization BID   carvedilol   6.25 mg Oral BID WC   heparin   5,000 Units Subcutaneous Q8H  melatonin  5 mg Oral QHS   pantoprazole   40 mg Oral Daily   QUEtiapine   12.5 mg Oral QHS   revefenacin   175 mcg Nebulization Daily   senna-docusate  2 tablet Oral Daily   sodium chloride  flush  3 mL Intravenous Q12H   tamsulosin   0.4 mg Oral Daily   Continuous Infusions:  lactated ringers  500 mL (08/30/24 0730)    PRN Meds:.acetaminophen  **OR** acetaminophen , hydrALAZINE , ipratropium-albuterol , ondansetron  **OR** ondansetron  (ZOFRAN ) IV, [EXPIRED] polyethylene glycol **FOLLOWED BY** polyethylene glycol, sodium chloride  flush  Antimicrobials: Anti-infectives (From admission, onward)    None        I have personally reviewed the following labs and images: CBC: Recent Labs  Lab 08/27/24 0950 08/28/24 0447 08/29/24 0438 08/30/24 0431  WBC 14.1* 11.9* 10.0 7.3  HGB 11.7* 12.1*  11.2* 10.6*  HCT 37.1* 38.1* 35.4* 33.0*  MCV 94.4 92.9 92.4 92.2  PLT 316 295 297 269   BMP &GFR Recent Labs  Lab 08/24/24 0415 08/27/24 0950 08/28/24 0447 08/29/24 0438 08/30/24 0431  NA 140 141 143 142 143  K 2.5* 3.9 4.0 3.5 4.4  CL 102 104 105 103 106  CO2 26 28 27 28 25   GLUCOSE 132* 121* 97 91 86  BUN 17 31* 20 15 16   CREATININE 0.83 1.35* 0.99 0.90 0.99  CALCIUM  8.9 9.4 9.8 9.5 9.4  MG 1.8  --  2.2 1.9 1.8  PHOS 1.5*  --   --   --  2.0*   Estimated Creatinine Clearance: 45.4 mL/min (by C-G formula based on SCr of 0.99 mg/dL). Liver & Pancreas: Recent Labs  Lab 08/27/24 0950 08/28/24 0447 08/29/24 0438 08/30/24 0431  AST 81* 99* 82*  --   ALT 45* 47* 45*  --   ALKPHOS 71 81 77  --   BILITOT 0.3 0.4 0.5  --   PROT 6.7 7.3 6.6  --   ALBUMIN  3.3* 3.5 3.3* 3.0*   No results for input(s): LIPASE, AMYLASE in the last 168 hours. No results for input(s): AMMONIA in the last 168 hours. Diabetic: No results for input(s): HGBA1C in the last 72 hours. Recent Labs  Lab 08/27/24 1030  GLUCAP 97   Cardiac Enzymes: Recent Labs  Lab 08/28/24 0447 08/29/24 0438 08/30/24 0431  CKTOTAL 1,145* 399* 161   No results for input(s): PROBNP in the last 8760 hours. Coagulation Profile: No results for input(s): INR, PROTIME in the last 168 hours. Thyroid  Function Tests: Recent Labs    08/28/24 0447  TSH 1.080   Lipid Profile: No results for input(s): CHOL, HDL, LDLCALC, TRIG, CHOLHDL, LDLDIRECT in the last 72 hours. Anemia Panel: No results for input(s): VITAMINB12, FOLATE, FERRITIN, TIBC, IRON, RETICCTPCT in the last 72 hours. Urine analysis:    Component Value Date/Time   COLORURINE YELLOW 08/27/2024 1054   APPEARANCEUR CLEAR 08/27/2024 1054   APPEARANCEUR Clear 04/29/2013 1543   LABSPEC 1.026 08/27/2024 1054   LABSPEC 1.020 04/29/2013 1543   PHURINE 5.0 08/27/2024 1054   GLUCOSEU NEGATIVE 08/27/2024 1054   GLUCOSEU  Negative 04/29/2013 1543   HGBUR SMALL (A) 08/27/2024 1054   BILIRUBINUR NEGATIVE 08/27/2024 1054   BILIRUBINUR Negative 03/14/2022 1506   BILIRUBINUR Negative 04/29/2013 1543   KETONESUR NEGATIVE 08/27/2024 1054   PROTEINUR 100 (A) 08/27/2024 1054   UROBILINOGEN 0.2 03/14/2022 1506   UROBILINOGEN 0.2 03/08/2012 1624   NITRITE NEGATIVE 08/27/2024 1054   LEUKOCYTESUR NEGATIVE 08/27/2024 1054   LEUKOCYTESUR Negative 04/29/2013 1543   Sepsis Labs: Invalid  input(s): PROCALCITONIN, LACTICIDVEN  Microbiology: Recent Results (from the past 240 hours)  Urine Culture (for pregnant, neutropenic or urologic patients or patients with an indwelling urinary catheter)     Status: None   Collection Time: 08/22/24  7:05 AM   Specimen: Urine, Clean Catch  Result Value Ref Range Status   Specimen Description URINE, CLEAN CATCH  Final   Special Requests NONE  Final   Culture   Final    NO GROWTH Performed at [facility], [address] [place], [place] [address]    Report Status 08/23/2024 FINAL  Final  Resp panel by RT-PCR (RSV, Flu A&B, Covid) Anterior Nasal Swab     Status: None   Collection Time: 08/27/24 12:40 PM   Specimen: Anterior Nasal Swab  Result Value Ref Range Status   SARS Coronavirus 2 by RT PCR NEGATIVE NEGATIVE Final    Comment: (NOTE) SARS-CoV-2 target nucleic acids are NOT DETECTED.  The SARS-CoV-2 RNA is generally detectable in upper respiratory specimens during the acute phase of infection. The lowest concentration of SARS-CoV-2 viral copies this assay can detect is 138 copies/mL. A negative result does not preclude SARS-Cov-2 infection and should not be used as the sole basis for treatment or other patient management decisions. A negative result may occur with  improper specimen collection/handling, submission of specimen other than nasopharyngeal swab, presence of viral mutation(s) within the areas targeted by this assay, and inadequate number of  viral copies(<138 copies/mL). A negative result must be combined with clinical observations, patient history, and epidemiological information. The expected result is Negative.  Fact Sheet for Patients:  bloggercourse.com  Fact Sheet for Healthcare Providers:  seriousbroker.it  This test is no t yet approved or cleared by the [place]  FDA and  has been authorized for detection and/or diagnosis of SARS-CoV-2 by FDA under an Emergency Use Authorization (EUA). This EUA will remain  in effect (meaning this test can be used) for the duration of the COVID-19 declaration under Section 564(b)(1) of the Act, 21 U.S.C.section 360bbb-3(b)(1), unless the authorization is terminated  or revoked sooner.       Influenza A by PCR NEGATIVE NEGATIVE Final   Influenza B by PCR NEGATIVE NEGATIVE Final    Comment: (NOTE) The Xpert Xpress SARS-CoV-2/FLU/RSV plus assay is intended as an aid in the diagnosis of influenza from Nasopharyngeal swab specimens and should not be used as a sole basis for treatment. Nasal washings and aspirates are unacceptable for Xpert Xpress SARS-CoV-2/FLU/RSV testing.  Fact Sheet for Patients: bloggercourse.com  Fact Sheet for Healthcare Providers: seriousbroker.it  This test is not yet approved or cleared by the [place]  FDA and has been authorized for detection and/or diagnosis of SARS-CoV-2 by FDA under an Emergency Use Authorization (EUA). This EUA will remain in effect (meaning this test can be used) for the duration of the COVID-19 declaration under Section 564(b)(1) of the Act, 21 U.S.C. section 360bbb-3(b)(1), unless the authorization is terminated or revoked.     Resp Syncytial Virus by PCR NEGATIVE NEGATIVE Final    Comment: (NOTE) Fact Sheet for Patients: bloggercourse.com  Fact Sheet for Healthcare  Providers: seriousbroker.it  This test is not yet approved or cleared by the [place]  FDA and has been authorized for detection and/or diagnosis of SARS-CoV-2 by FDA under an Emergency Use Authorization (EUA). This EUA will remain in effect (meaning this test can be used) for the duration of the COVID-19 declaration under Section 564(b)(1) of the Act, 21 U.S.C. section  360bbb-3(b)(1), unless the authorization is terminated or revoked.  Performed at [facility], [address], [place], [place] [address]     Radiology Studies: No results found.     Shirel Mallis T. Annye Forrey [facility]  If 7PM-7AM, please contact night-coverage [email or web address] 08/30/2024, 12:05 PM   "

## 2024-08-30 NOTE — Progress Notes (Signed)
 Physical Therapy Treatment Patient Details Name: Leon Estes MRN: [id number] DOB: 10/26/1930 Today's Date: 08/30/2024   History of Present Illness 89 year old male with recent hospitalization and discharge on 08/24/2024 for altered mental status presented to the hospital with increasing confusion disorientation. Dx of AKI, metabolic encephalopathy. Pt with past medical history of hypertension, paroxysmal atrial fibrillation not on anticoagulation, hyperlipidemia, CKD, COPD, dementia    PT Comments  Pt very cooperative and pleasant and with marked improvement in ability to mobilize.  Pt requiring min assist to stand from recliner and ambulate ~100' in hall with RW.  Distance ltd by fatigue.  Per physician family would like to take pt home and noted that he will have 24/7 assist.  With current improvement, change in recommendation to Home with HHPT follow up is appropriate.    If plan is discharge home, recommend the following: A little help with walking and/or transfers;A little help with bathing/dressing/bathroom;Assistance with cooking/housework;Assist for transportation;Help with stairs or ramp for entrance   Can travel by private vehicle        Equipment Recommendations  None recommended by PT    Recommendations for Other Services       Precautions / Restrictions Precautions Precautions: Fall Recall of Precautions/Restrictions: Impaired Restrictions Weight Bearing Restrictions Per Provider Order: No     Mobility  Bed Mobility               General bed mobility comments: Pt up in chair and requests back to same    Transfers Overall transfer level: Needs assistance Equipment used: Rolling walker (2 wheels) Transfers: Sit to/from Stand Sit to Stand: Min assist           General transfer comment: cues for use of UEs to self assist    Ambulation/Gait Ambulation/Gait assistance: Min assist Gait Distance (Feet): 100 Feet Assistive device: Rolling walker (2  wheels) Gait Pattern/deviations: Step-through pattern, Decreased step length - right, Decreased step length - left, Shuffle, Trunk flexed Gait velocity: decr     General Gait Details: cues for posture, position from RW and safety awareness   Stairs             Wheelchair Mobility     Tilt Bed    Modified Rankin (Stroke Patients Only)       Balance Overall balance assessment: Needs assistance Sitting-balance support: Feet supported, No upper extremity supported Sitting balance-Leahy Scale: Good     Standing balance support: Bilateral upper extremity supported Standing balance-Leahy Scale: Poor                              Communication Communication Communication: Impaired Factors Affecting Communication: Hearing impaired  Cognition Arousal: Alert Behavior During Therapy: WFL for tasks assessed/performed, Impulsive   PT - Cognitive impairments: No family/caregiver present to determine baseline, Orientation, Safety/Judgement   Orientation impairments: Person                     Following commands: Impaired Following commands impaired: Follows one step commands inconsistently    Cueing Cueing Techniques: Verbal cues, Tactile cues, Visual cues  Exercises      General Comments        Pertinent Vitals/Pain Pain Assessment Pain Assessment: Faces Faces Pain Scale: Hurts a little bit Pain Location: my leg muscles hurt from not moving Pain Descriptors / Indicators: Aching, Sore Pain Intervention(s): Limited activity within patient's tolerance, Monitored during session    Home Living  Prior Function            PT Goals (current goals can now be found in the care plan section) Acute Rehab PT Goals Patient Stated Goal: Home PT Goal Formulation: Patient unable to participate in goal setting Time For Goal Achievement: 09/11/24 Potential to Achieve Goals: Fair Progress towards PT goals: Progressing  toward goals    Frequency    Min 2X/week      PT Plan      Co-evaluation              AM-PAC PT 6 Clicks Mobility   Outcome Measure  Help needed turning from your back to your side while in a flat bed without using bedrails?: A Little Help needed moving from lying on your back to sitting on the side of a flat bed without using bedrails?: A Little Help needed moving to and from a bed to a chair (including a wheelchair)?: A Little Help needed standing up from a chair using your arms (e.g., wheelchair or bedside chair)?: A Little Help needed to walk in hospital room?: A Little Help needed climbing 3-5 steps with a railing? : A Lot 6 Click Score: 17    End of Session Equipment Utilized During Treatment: Gait belt Activity Tolerance: Patient tolerated treatment well Patient left: in chair;with chair alarm set;with call bell/phone within reach Nurse Communication: Mobility status PT Visit Diagnosis: Unsteadiness on feet (R26.81);Muscle weakness (generalized) (M62.81);Other abnormalities of gait and mobility (R26.89)     Time: 8652-8594 PT Time Calculation (min) (ACUTE ONLY): 18 min  Charges:    $Gait Training: 8-22 mins PT General Charges $$ ACUTE PT VISIT: 1 Visit                     [facility] PT Acute Rehabilitation Services Office [phone]    Kennedie Pardoe 08/30/2024, 2:33 PM

## 2024-08-30 NOTE — Progress Notes (Signed)
 " Daily Progress Note   Patient Name: Leon Estes       Date: 08/30/2024 DOB: Jan 04, 1931  Age: 89 y.o. MRN#: [id number] Attending Physician: Kathrin Mignon DASEN, MD Primary Care Physician: Bennett Reuben POUR, MD Admit Date: 08/27/2024 Length of Stay: 2 days  Reason for Follow-up: Establishing goals of care  Subjective:   CC: Patient yelling out confused.  Following up regarding goals of care.  Reviewed EMR noting that patient received as needed Haldol  2 mg x 1 dose overnight for agitation.  Patient continues to have episodes of what appears to be described as sundowning.  Had discussed this with daughter on 08/29/2024.  When presenting to bedside to see patient, he is yelling out at times confused.  No visitors present at bedside.  Patient remains in restraints with abdominal belt in place.  Discussed updates with bedside RN.  RN noted that patient would not swallow medications this morning and was instead spitting them out at her.  Patient following commands.  No family had been to bedside at time of visit.  Discussed care with hospitalist to coordinate care.  Hospitalist was able to meet with family yesterday and hopeful to be able to meet with them again today to discuss planning moving forward acknowledging that patient's delirium is worsening by remaining in the hospital.  Objective:   Vital Signs:  BP (!) 149/64 (BP Location: Right Arm)   Pulse 69   Temp 98.9 F (37.2 C) (Oral)   Resp 18   Ht 5' 8 (1.727 m)   Wt 66.1 kg   SpO2 97%   BMI 22.16 kg/m   Physical Exam: General: Agitated, confused, chronically ill-appearing, frail Cardiovascular: RRR Respiratory: no increased work of breathing noted, not in respiratory distress Abdomen: not distended Extremities: Upper extremities in restraints bilaterally  Assessment & Plan:   Assessment: Patient is a 89 year old male with a past medical history of cognitive impairment, HFrEF, COPD, CKD, PAF not on anticoagulation, dysphagia,  hypertension, and recent hospitalization from 1/6-1/07/2025 for acute metabolic encephalopathy, AKI, and dehydration with associated electrolyte abnormalities who was admitted on 08/27/2024 due to increased confusion, disorientation, and decreased oral intake at home since discharge.  Since admission, patient receiving management for delirium in the setting of underlying cognitive impairment,  generalized weakness, poor oral intake, and failure to thrive.  Palliative medicine team consulted to assist with goals of care conversations.   Recommendations/Plan: # Complex medical decision making/goals of care:  - Patient remains confused and unable to participate in complex medical decision making.  - Have discussed care with daughter on 08/29/2024.  Recognize patient likely has underlying cognitive impairment (even if mild) and currently suffering from delirium due to acute illnesses and hospitalizations.  Continue to recommend that family needs to discuss about support moving forward since patient was unable to remain at home due to his continued agitation at home after recent hospitalization.  Whether this is to be at home with 24/7 caregivers (and potentially hospice support to avoid rehospitalization's that are worsening delirium and symptom management) versus placement in a facility.  Awaiting family's decisions regarding pathway for medical care they would like to pursue.  Palliative medicine team continuing to follow the patient to medical journey.  -  Code Status: Limited: Do not attempt resuscitation (DNR) -DNR-LIMITED -Do Not Intubate/DNI   # Psychosocial Support:  - Wife, 2 daughters, 2 sons  # Discharge Planning: To Be Determined  Discussed with: hospitalist, RN  Thank you for allowing the palliative care  team to participate in the care Garen CHRISTELLA Gin.  Tinnie Radar, DO Palliative Care Provider PMT # [phone]  If patient remains symptomatic despite maximum doses, please call PMT at  [phone] between 0700 and 1900. Outside of these hours, please call attending, as PMT does not have night coverage.  Billing based on MDM: Low Problems addressed: 1 stable acute illness Amount and/or complexity of data: Review of prior external notes from each unique source   "

## 2024-08-30 NOTE — Plan of Care (Signed)
 Called and talked to two of patient's daughters over the phone again this afternoon. They are still working on care giver and not ready to take patient home yet.  Meanwhile, ordered home health PT/OT/RN/ST/aide.

## 2024-08-31 ENCOUNTER — Other Ambulatory Visit (HOSPITAL_COMMUNITY): Payer: Self-pay

## 2024-08-31 ENCOUNTER — Other Ambulatory Visit: Payer: Self-pay

## 2024-08-31 ENCOUNTER — Telehealth (HOSPITAL_COMMUNITY): Payer: Self-pay

## 2024-08-31 DIAGNOSIS — N179 Acute kidney failure, unspecified: Secondary | ICD-10-CM | POA: Diagnosis not present

## 2024-08-31 DIAGNOSIS — I10 Essential (primary) hypertension: Secondary | ICD-10-CM | POA: Diagnosis not present

## 2024-08-31 DIAGNOSIS — Z7189 Other specified counseling: Secondary | ICD-10-CM | POA: Diagnosis not present

## 2024-08-31 DIAGNOSIS — R531 Weakness: Secondary | ICD-10-CM | POA: Diagnosis not present

## 2024-08-31 DIAGNOSIS — E7800 Pure hypercholesterolemia, unspecified: Secondary | ICD-10-CM | POA: Diagnosis not present

## 2024-08-31 DIAGNOSIS — I480 Paroxysmal atrial fibrillation: Secondary | ICD-10-CM | POA: Diagnosis not present

## 2024-08-31 DIAGNOSIS — R410 Disorientation, unspecified: Secondary | ICD-10-CM | POA: Diagnosis not present

## 2024-08-31 DIAGNOSIS — Z66 Do not resuscitate: Secondary | ICD-10-CM | POA: Diagnosis not present

## 2024-08-31 DIAGNOSIS — Z515 Encounter for palliative care: Secondary | ICD-10-CM | POA: Diagnosis not present

## 2024-08-31 DIAGNOSIS — R4189 Other symptoms and signs involving cognitive functions and awareness: Secondary | ICD-10-CM | POA: Diagnosis not present

## 2024-08-31 DIAGNOSIS — J449 Chronic obstructive pulmonary disease, unspecified: Secondary | ICD-10-CM | POA: Diagnosis not present

## 2024-08-31 DIAGNOSIS — I5042 Chronic combined systolic (congestive) and diastolic (congestive) heart failure: Secondary | ICD-10-CM | POA: Diagnosis not present

## 2024-08-31 MED ORDER — ENSURE PLUS HIGH PROTEIN PO LIQD
237.0000 mL | Freq: Two times a day (BID) | ORAL | Status: DC
  Administered 2024-09-01 – 2024-09-02 (×3): 237 mL via ORAL

## 2024-08-31 MED ORDER — LOSARTAN POTASSIUM 25 MG PO TABS
50.0000 mg | ORAL_TABLET | Freq: Every day | ORAL | Status: DC
  Administered 2024-08-31 – 2024-09-02 (×3): 50 mg via ORAL
  Filled 2024-08-31 (×3): qty 2

## 2024-08-31 NOTE — Progress Notes (Signed)
 Mobility Specialist Progress Note:   08/31/24 1038  Mobility  Activity Pivoted/transferred from bed to chair  Level of Assistance Minimal assist, patient does 75% or more  Assistive Device Front wheel walker  Distance Ambulated (ft) 3 ft  Activity Response Tolerated well  Mobility Referral Yes  Mobility visit 1 Mobility  Mobility Specialist Start Time (ACUTE ONLY) 0930  Mobility Specialist Stop Time (ACUTE ONLY) 0954  Mobility Specialist Time Calculation (min) (ACUTE ONLY) 24 min   Pt was received in bed and agreed to xfer to recliner. Returned to recliner with all needs met.  [employer] - Mobility Specialist

## 2024-08-31 NOTE — Progress Notes (Signed)
" °  Daily Progress Note   Patient Name: Leon Estes       Date: 08/31/2024 DOB: [birth year]  Age: 89 y.o. MRN#: [id number] Attending Physician: Kathrin Mignon DASEN, MD Primary Care Physician: Bennett Reuben POUR, MD Admit Date: 08/27/2024 Length of Stay: 3 days  Reason for Follow-up: Establishing goals of care  Subjective:   CC: Patient still overall confused though sitting up in chair at bedside.  Following up regarding goals of care.  Reviewed hospitalist documentation and plan of care note from 08/30/2024.  Family working to get 24/7 caregivers in place.  Planning to get patient home with home health as well.  Also reviewed nursing documentation from the evening which noted patient kicked respiratory therapist stating that staff was trying to poison him.  Presented to bedside this morning.  Patient sitting up in chair at bedside.  Attempted to interact though patient still overall confused and cannot appropriately engage in questioning.  Discussed care with hospitalist to coordinate care.  Objective:   Vital Signs:  BP (!) 180/79 (BP Location: Left Arm)   Pulse 80   Temp (!) 97.5 F (36.4 C) (Oral)   Resp 18   Ht 5' 8 (1.727 m)   Wt 63 kg   SpO2 95%   BMI 21.12 kg/m   Physical Exam: General: Pleasantly confused, chronically ill-appearing, frail Cardiovascular: RRR Respiratory: no increased work of breathing noted, not in respiratory distress Abdomen: not distended  Assessment & Plan:   Assessment: Patient is a 89 year old male with a past medical history of cognitive impairment, HFrEF, COPD, CKD, PAF not on anticoagulation, dysphagia, hypertension, and recent hospitalization from 1/6-1/07/2025 for acute metabolic encephalopathy, AKI, and dehydration with associated electrolyte abnormalities who was admitted on 08/27/2024 due to increased confusion, disorientation, and decreased oral intake at home since discharge.  Since admission, patient receiving management for delirium in the  setting of underlying cognitive impairment,  generalized weakness, poor oral intake, and failure to thrive.  Palliative medicine team consulted to assist with goals of care conversations.   Recommendations/Plan: # Complex medical decision making/goals of care:  - Patient remains confused and unable to participate in complex medical decision making.  - Had already discussed care with daughter on 08/29/2024.  Recognize patient likely has underlying cognitive impairment (even if mild) and currently suffering from delirium due to acute illnesses and hospitalizations.  Current plan is for patient to return home with 24/7 caregivers and home health.  Continue to recommend engagement of palliative medicine at home particularly if patient decompensates, would then recommend transition over to hospice from palliative medicine so that patient does not have to be rehospitalized and causing him more distress and delirium.  Palliative medicine team continuing to follow the patient to medical journey.  -  Code Status: Limited: Do not attempt resuscitation (DNR) -DNR-LIMITED -Do Not Intubate/DNI   # Psychosocial Support:  - Wife, 2 daughters, 2 sons  # Discharge Planning: Home with Palliative Services and 24/7 caregivers  Thank you for allowing the palliative care team to participate in the care HAYTHAM MAHER.  Tinnie Radar, DO Palliative Care Provider PMT # [phone]  If patient remains symptomatic despite maximum doses, please call PMT at [phone] between 0700 and 1900. Outside of these hours, please call attending, as PMT does not have night coverage.  Billing based on MDM: Low  Problems addressed: 1 stable acute illness  Amount and/or complexity of data: Review of prior external notes from each unique source  "

## 2024-08-31 NOTE — Progress Notes (Addendum)
 " PROGRESS NOTE  Leon Estes FMW:[id number] DOB: Dec 07, 1930   PCP: Bennett Reuben POUR, MD  Patient is from: Home.  Lives with family.  DOA: 08/27/2024 LOS: 3  Chief complaints Chief Complaint  Patient presents with   Weakness     Brief Narrative / Interim history: 89 year old M with PMH of cognitive impairment, HFmrEF, COPD, CKD, PAF not on anticoagulation, dysphagia, HTN and recent hospitalization from 1/6-1/12 for acute metabolic encephalopathy, AKI, dehydration and electrolyte abnormalities return to the hospital due to increased confusion, disorientation and decreased oral intake for the last 3 to 4 days.  Spouse unable to care for patient and they brought patient back.  In ED, slightly tachypneic.  Other vital stable.  COVID-19, influenza and RSV PCR nonreactive.  UA does not suggest UTI. Cr 1.3 (baseline 0.8).  WBC 14.1.  Lactic acid 1.6.  CXR with some atelectasis.  CT abdomen and pelvis without acute finding but showed RLL atelectasis, hepatic steatosis, stable 3.1 left renal cyst, moderate prostate enlargement, 9 x 9 mm lung nodule and aortic sclerosis.  Patient was admitted with working diagnosis of  physical and mental decline with encephalopathy , generalized weakness.  On further discussion with patient's daughter and wife, patient with no formal diagnosis of dementia but some forgetfulness and tendency to ask the same question repeatedly.  Otherwise, functional driving his golf car, feeding his deer and helping with dishes... as late as last month.  Family noted significant decline after they put down their dog on 12/31.   Patient with waxing and waning mental status.  Family working on caregiver before taking patient home.  Palliative medicine following.  Subjective: Seen and examined earlier this morning.  Reportedly became paranoid and kicked RT overnight.  He was awake and alert but not answering orientation questions earlier this morning.  Daughter and wife at bedside  washing his hair this afternoon.  He was somewhat sleepy but wakes to voice.  Assessment and plan: Delirium with possible mild underlying cognitive impairment: Pretty functional as of last months until they put down their dog on 12/31.  Encephalopathy workup on 1/7 including MRI brain, B12, RPR, TSH and ammonia unrevealing.  He is currently confused requiring pharmacologic and soft restraints.  Currently awake and alert but only oriented to self.  My concern is that his delirium might not get better in the hospital. -Decreased night Seroquel  to 12.5 mg and added melatonin on 1/17. -Reorientation and delirium precaution -Continue IV Haldol  as needed. -Try him off soft restraints daily -Appreciate help by palliative. -SLP recommended dysphagia 1 diet early in the course.  Will request SLP to revisit -Family working on care giver     Acute kidney injury: Likely prerenal in the setting of poor p.o. intake.  b/l Cr ~0.9.  AKI resolved. Recent Labs    08/19/24 0502 08/20/24 0432 08/21/24 0349 08/22/24 0335 08/23/24 0316 08/24/24 0415 08/27/24 0950 08/28/24 0447 08/29/24 0438 08/30/24 0431  BUN 14 11 15 18 21 17  31* 20 15 16   CREATININE 1.16 0.97 0.91 1.07 1.10 0.83 1.35* 0.99 0.90 0.99  - IV fluid intermittently. - Avoid diuretics due to risk of dehydration    Generalized weakness, failure to thrive: Recurrent hospitalization due to delirium and poor p.o. intake.  Seems to have advanced dementia.  Appropriately DNR. -Therapy recommended SNF -Palliative consult for further goal of care discussion   Paroxysmal atrial fibrillation: Rate controlled.  Not on anticoagulation. -Coreg  as above.   History of hypertension: BP elevated  but difficult to get accurate measurement due to patient's delirium. -Increased Coreg  to 6.25 mg twice daily -Continue losartan  50 mg daily -Avoid diuretics due to risk of dehydration  Chronic HFmrEF: TTE in 2023 with LVEF of 45 to 50%, GH, G1 DD, severe  LAE.  Appears euvolemic on exam. - Monitor fluid and respiratory status while on IV fluid  Chronic COPD: Stable. - Optimize nebulizers and discontinue oxygen.  Mild leukocytosis: Likely reactive/demargination.  Low suspicion for infection.  Improved without antibiotics.  Mild rhabdomyolysis: CK 1100>> 400> 161  Hyperlipidemia - Doubt utility of statin for primary prevention given his age.  Elevated AST: Likely due to rhabdo.  Improved. - Continue monitoring  Pulmonary nodule: CT showed 9 x 9 mm RLL nodule.  Radiology recommended noncontrast CT chest for complete evaluation of lung disease on elective basis.  Given his age and comorbidity, I doubt there is any utility of CT chest.  BPH: CT showed moderate prostatic enlargement. - Continue home Flomax  - Bladder scan as needed. - Strict intake and output  Body mass index is 21.12 kg/m.         Pressure skin injury: Present on admission. Wound 08/27/24 2100 Pressure Injury Buttocks Right;Left Stage 1 -  Intact skin with non-blanchable redness of a localized area usually over a bony prominence. (Active)   DVT prophylaxis:  heparin  injection 5,000 Units Start: 08/27/24 2200  Code Status: DNR Family Communication: Updated one of patient's wife and daughter at bedside. Level of care: Med-Surg Status is: Inpatient The patient will remain inpatient because: Dementia with behavioral disturbance/delirium, failure to thrive, generalized weakness   Final disposition: SNF   35 minutes with more than 50% spent in reviewing records, counseling patient/family and coordinating care.  Consultants:  Palliative medicine  Procedures: None  Microbiology summarized: COVID-19, influenza and RSV PCR nonreactive  Objective: Vitals:   08/31/24 0859 08/31/24 0904 08/31/24 1039 08/31/24 1355  BP: (!) 183/73  (!) 183/73 121/63  Pulse: 73  73 73  Resp: (!) 24   (!) 22  Temp: 98.4 F (36.9 C)   98.2 F (36.8 C)  TempSrc: Oral   Oral   SpO2: 97% 95%  96%  Weight:      Height:        Examination:  GENERAL: No apparent distress.  Nontoxic.  Appears calm. HEENT: MMM.  Vision and hearing grossly intact.  NECK: Supple.  No apparent JVD.  RESP:  No IWOB.  Fair aeration bilaterally. CVS:  RRR. Heart sounds normal.  ABD/GI/GU: BS+. Abd soft, NTND.  MSK/EXT:  Moves extremities. No apparent deformity. No edema.  SKIN: no apparent skin lesion or wound NEURO: Awake and alert.  Not answering orientation questions.  Follows commands.  No apparent focal neuro deficit.  Sch Meds:  Scheduled Meds:  arformoterol   15 mcg Nebulization BID   budesonide   0.5 mg Nebulization BID   carvedilol   6.25 mg Oral BID WC   feeding supplement  237 mL Oral BID BM   heparin   5,000 Units Subcutaneous Q8H   losartan   50 mg Oral Daily   melatonin  5 mg Oral QHS   pantoprazole   40 mg Oral Daily   QUEtiapine   12.5 mg Oral QHS   revefenacin   175 mcg Nebulization Daily   senna-docusate  2 tablet Oral Daily   sodium chloride  flush  3 mL Intravenous Q12H   tamsulosin   0.4 mg Oral Daily   Continuous Infusions:    PRN Meds:.acetaminophen  **OR** acetaminophen , haloperidol  lactate,  hydrALAZINE , ipratropium-albuterol , ondansetron  **OR** ondansetron  (ZOFRAN ) IV, [EXPIRED] polyethylene glycol **FOLLOWED BY** polyethylene glycol, sodium chloride  flush  Antimicrobials: Anti-infectives (From admission, onward)    None        I have personally reviewed the following labs and images: CBC: Recent Labs  Lab 08/27/24 0950 08/28/24 0447 08/29/24 0438 08/30/24 0431  WBC 14.1* 11.9* 10.0 7.3  HGB 11.7* 12.1* 11.2* 10.6*  HCT 37.1* 38.1* 35.4* 33.0*  MCV 94.4 92.9 92.4 92.2  PLT 316 295 297 269   BMP &GFR Recent Labs  Lab 08/27/24 0950 08/28/24 0447 08/29/24 0438 08/30/24 0431  NA 141 143 142 143  K 3.9 4.0 3.5 4.4  CL 104 105 103 106  CO2 28 27 28 25   GLUCOSE 121* 97 91 86  BUN 31* 20 15 16   CREATININE 1.35* 0.99 0.90 0.99  CALCIUM   9.4 9.8 9.5 9.4  MG  --  2.2 1.9 1.8  PHOS  --   --   --  2.0*   Estimated Creatinine Clearance: 43.3 mL/min (by C-G formula based on SCr of 0.99 mg/dL). Liver & Pancreas: Recent Labs  Lab 08/27/24 0950 08/28/24 0447 08/29/24 0438 08/30/24 0431  AST 81* 99* 82*  --   ALT 45* 47* 45*  --   ALKPHOS 71 81 77  --   BILITOT 0.3 0.4 0.5  --   PROT 6.7 7.3 6.6  --   ALBUMIN  3.3* 3.5 3.3* 3.0*   No results for input(s): LIPASE, AMYLASE in the last 168 hours. No results for input(s): AMMONIA in the last 168 hours. Diabetic: No results for input(s): HGBA1C in the last 72 hours. Recent Labs  Lab 08/27/24 1030  GLUCAP 97   Cardiac Enzymes: Recent Labs  Lab 08/28/24 0447 08/29/24 0438 08/30/24 0431  CKTOTAL 1,145* 399* 161   No results for input(s): PROBNP in the last 8760 hours. Coagulation Profile: No results for input(s): INR, PROTIME in the last 168 hours. Thyroid  Function Tests: No results for input(s): TSH, T4TOTAL, FREET4, T3FREE, THYROIDAB in the last 72 hours.  Lipid Profile: No results for input(s): CHOL, HDL, LDLCALC, TRIG, CHOLHDL, LDLDIRECT in the last 72 hours. Anemia Panel: No results for input(s): VITAMINB12, FOLATE, FERRITIN, TIBC, IRON, RETICCTPCT in the last 72 hours. Urine analysis:    Component Value Date/Time   COLORURINE YELLOW 08/27/2024 1054   APPEARANCEUR CLEAR 08/27/2024 1054   APPEARANCEUR Clear 04/29/2013 1543   LABSPEC 1.026 08/27/2024 1054   LABSPEC 1.020 04/29/2013 1543   PHURINE 5.0 08/27/2024 1054   GLUCOSEU NEGATIVE 08/27/2024 1054   GLUCOSEU Negative 04/29/2013 1543   HGBUR SMALL (A) 08/27/2024 1054   BILIRUBINUR NEGATIVE 08/27/2024 1054   BILIRUBINUR Negative 03/14/2022 1506   BILIRUBINUR Negative 04/29/2013 1543   KETONESUR NEGATIVE 08/27/2024 1054   PROTEINUR 100 (A) 08/27/2024 1054   UROBILINOGEN 0.2 03/14/2022 1506   UROBILINOGEN 0.2 03/08/2012 1624   NITRITE NEGATIVE  08/27/2024 1054   LEUKOCYTESUR NEGATIVE 08/27/2024 1054   LEUKOCYTESUR Negative 04/29/2013 1543   Sepsis Labs: Invalid input(s): PROCALCITONIN, LACTICIDVEN  Microbiology: Recent Results (from the past 240 hours)  Urine Culture (for pregnant, neutropenic or urologic patients or patients with an indwelling urinary catheter)     Status: None   Collection Time: 08/22/24  7:05 AM   Specimen: Urine, Clean Catch  Result Value Ref Range Status   Specimen Description URINE, CLEAN CATCH  Final   Special Requests NONE  Final   Culture   Final    NO GROWTH Performed at  [facility], [address], [place], [place] [address]    Report Status 08/23/2024 FINAL  Final  Resp panel by RT-PCR (RSV, Flu A&B, Covid) Anterior Nasal Swab     Status: None   Collection Time: 08/27/24 12:40 PM   Specimen: Anterior Nasal Swab  Result Value Ref Range Status   SARS Coronavirus 2 by RT PCR NEGATIVE NEGATIVE Final    Comment: (NOTE) SARS-CoV-2 target nucleic acids are NOT DETECTED.  The SARS-CoV-2 RNA is generally detectable in upper respiratory specimens during the acute phase of infection. The lowest concentration of SARS-CoV-2 viral copies this assay can detect is 138 copies/mL. A negative result does not preclude SARS-Cov-2 infection and should not be used as the sole basis for treatment or other patient management decisions. A negative result may occur with  improper specimen collection/handling, submission of specimen other than nasopharyngeal swab, presence of viral mutation(s) within the areas targeted by this assay, and inadequate number of viral copies(<138 copies/mL). A negative result must be combined with clinical observations, patient history, and epidemiological information. The expected result is Negative.  Fact Sheet for Patients:  bloggercourse.com  Fact Sheet for Healthcare Providers:  seriousbroker.it  This test is no t  yet approved or cleared by the [place]  FDA and  has been authorized for detection and/or diagnosis of SARS-CoV-2 by FDA under an Emergency Use Authorization (EUA). This EUA will remain  in effect (meaning this test can be used) for the duration of the COVID-19 declaration under Section 564(b)(1) of the Act, 21 U.S.C.section 360bbb-3(b)(1), unless the authorization is terminated  or revoked sooner.       Influenza A by PCR NEGATIVE NEGATIVE Final   Influenza B by PCR NEGATIVE NEGATIVE Final    Comment: (NOTE) The Xpert Xpress SARS-CoV-2/FLU/RSV plus assay is intended as an aid in the diagnosis of influenza from Nasopharyngeal swab specimens and should not be used as a sole basis for treatment. Nasal washings and aspirates are unacceptable for Xpert Xpress SARS-CoV-2/FLU/RSV testing.  Fact Sheet for Patients: bloggercourse.com  Fact Sheet for Healthcare Providers: seriousbroker.it  This test is not yet approved or cleared by the [place]  FDA and has been authorized for detection and/or diagnosis of SARS-CoV-2 by FDA under an Emergency Use Authorization (EUA). This EUA will remain in effect (meaning this test can be used) for the duration of the COVID-19 declaration under Section 564(b)(1) of the Act, 21 U.S.C. section 360bbb-3(b)(1), unless the authorization is terminated or revoked.     Resp Syncytial Virus by PCR NEGATIVE NEGATIVE Final    Comment: (NOTE) Fact Sheet for Patients: bloggercourse.com  Fact Sheet for Healthcare Providers: seriousbroker.it  This test is not yet approved or cleared by the [place]  FDA and has been authorized for detection and/or diagnosis of SARS-CoV-2 by FDA under an Emergency Use Authorization (EUA). This EUA will remain in effect (meaning this test can be used) for the duration of the COVID-19 declaration under Section 564(b)(1)  of the Act, 21 U.S.C. section 360bbb-3(b)(1), unless the authorization is terminated or revoked.  Performed at [facility], [address] [place], [place] [address]     Radiology Studies: No results found.     Camree Wigington T. Nyhla Mountjoy [facility]  If 7PM-7AM, please contact night-coverage [email or web address] 08/31/2024, 4:07 PM   "

## 2024-08-31 NOTE — TOC Initial Note (Addendum)
 Transition of Care [facility]) - Initial/Assessment Note    Patient Details  Name: Leon Estes MRN: [id number] Date of Birth: 1931/04/13  [facility] CM/SW Contact:    Alfonse JONELLE Rex, RN Phone Number: 08/31/2024, 2:39 PM  Clinical Narrative:     Met with pt's spouse and daughter at bedside to introduce role of INPT CM and review for dc planning, PT recommendation for [facility] PT, spouse/dtr agreeable, prefers [facility] as they have used them before. Spouse reports she is patient's primary caregiver, states she she would like to have caregiver assistance in the home. Spouse confirmed patient has Medicaid as his secondary insurance. NCM directed spouse to patient's Medicaid casework to assist with arranging caregiver services, spouse voiced understanding. TOC consult for Outpatient/home palliative referral . Previous medical notes states referral was sent to [facility] for Home Palliative Care, spouse/dtr confirmed services were never initiated. Teams chat sent to [facility] hospital liaison, amy G, to follow up with family. CM will continue to follow.         Patient Goals and CMS Choice            Expected Discharge Plan and Services                                              Prior Living Arrangements/Services                       Activities of Daily Living      Permission Sought/Granted                  Emotional Assessment              Admission diagnosis:  Generalized weakness [R53.1] AKI (acute kidney injury) [N17.9] Patient Active Problem List   Diagnosis Date Noted   Delirium 08/30/2024   Cognitive impairment 08/29/2024   DNR (do not resuscitate) 08/29/2024   Decreased oral intake 08/29/2024   Counseling and coordination of care 08/29/2024   Goals of care, counseling/discussion 08/29/2024   Generalized weakness 08/29/2024   Palliative care encounter 08/29/2024   Acute encephalopathy 08/19/2024   SIRS (systemic  inflammatory response syndrome) (HCC) 08/19/2024   Left maxillary sinus opacification 08/19/2024   Vascular dementia without behavioral disturbance, psychotic disturbance, mood disturbance, or anxiety (HCC) 04/09/2023   Osteoarthritis of both hands 01/02/2023   Chronic cholecystitis 08/01/2022   Chronic combined systolic (congestive) and diastolic (congestive) heart failure (HCC) 08/01/2022   Gallstones 07/02/2022   NSVT (nonsustained ventricular tachycardia) (HCC)    Chronic heart failure with mildly reduced ejection fraction (HFmrEF) (HCC) 10/12/2021   PAF (paroxysmal atrial fibrillation) (HCC) 10/12/2021   Reflux esophagitis 09/28/2021   Neurodermatitis 09/15/2021   AKI (acute kidney injury) 07/25/2021   Allergic rhinitis due to pollen 06/12/2021   GERD (gastroesophageal reflux disease) 05/04/2021   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) 04/07/2021   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   HOH (hard of hearing) 12/28/2020   Pulmonary nodule 12/06/2020   Preventative health care 10/12/2020   Macular degeneration, wet (HCC) 10/12/2020   BPH (benign prostatic hyperplasia) 01/13/2020   Neuropathy 02/18/2018   COPD (chronic obstructive pulmonary disease) (HCC)    Benign essential HTN 04/09/2017   Atherosclerotic heart disease of native coronary artery with angina pectoris 03/03/2015   Hyperlipidemia 03/03/2015   PCP:  Bennett Reuben POUR, MD Pharmacy:   [facility] [place], [place] - [address] [place] [address] Phone: [phone] Fax: [phone]  [facility] [address] [place] [address] Phone: [phone] Fax: [phone]  [facility] [address], [place] - [address] [place] [address] Phone: [phone] Fax: [phone]  [facility] - [facility] [address] [place] [address] Phone:  [phone] Fax: [phone]     Social Drivers of Health (SDOH) Social History: SDOH Screenings   Food Insecurity: Patient Unable To Answer (08/29/2024)  Housing: Unknown (08/29/2024)  Transportation Needs: Patient Unable To Answer (08/29/2024)  Utilities: Patient Unable To Answer (08/29/2024)  Depression (PHQ2-9): Low Risk (04/09/2024)  Financial Resource Strain: Medium Risk (04/03/2024)   Received from [facility]  Social Connections: Unknown (08/29/2024)  Recent Concern: Social Connections - Moderately Isolated (08/19/2024)  Tobacco Use: High Risk (08/19/2024)   SDOH Interventions:     Readmission Risk Interventions     No data to display

## 2024-09-01 ENCOUNTER — Other Ambulatory Visit (HOSPITAL_COMMUNITY): Payer: Self-pay

## 2024-09-01 ENCOUNTER — Inpatient Hospital Stay

## 2024-09-01 DIAGNOSIS — I480 Paroxysmal atrial fibrillation: Secondary | ICD-10-CM | POA: Diagnosis not present

## 2024-09-01 DIAGNOSIS — I5042 Chronic combined systolic (congestive) and diastolic (congestive) heart failure: Secondary | ICD-10-CM | POA: Diagnosis not present

## 2024-09-01 DIAGNOSIS — I10 Essential (primary) hypertension: Secondary | ICD-10-CM | POA: Diagnosis not present

## 2024-09-01 DIAGNOSIS — N179 Acute kidney failure, unspecified: Secondary | ICD-10-CM | POA: Diagnosis not present

## 2024-09-01 NOTE — TOC Progression Note (Addendum)
 Transition of Care [facility]) - Progression Note    Patient Details  Name: Leon Estes MRN: [id number] Date of Birth: March 15, 1931  Transition of Care [facility]) CM/SW Contact  Doneta Glenys DASEN, RN Phone Number: 09/01/2024, 1:58 PM  Clinical Narrative:     CM spoke with Cy and daughter in th room. Discussed PCA paper work is completed by the PCP.  Wants to discharge in Thursday when [facility] will be able to come to the home.                    Expected Discharge Plan and Services                                               Social Drivers of Health (SDOH) Interventions SDOH Screenings   Food Insecurity: Patient Unable To Answer (08/29/2024)  Housing: Unknown (08/29/2024)  Transportation Needs: Patient Unable To Answer (08/29/2024)  Utilities: Patient Unable To Answer (08/29/2024)  Depression (PHQ2-9): Low Risk (04/09/2024)  Financial Resource Strain: Medium Risk (04/03/2024)   Received from [facility]  Social Connections: Unknown (08/29/2024)  Recent Concern: Social Connections - Moderately Isolated (08/19/2024)  Tobacco Use: High Risk (08/19/2024)    Readmission Risk Interventions     No data to display

## 2024-09-01 NOTE — Telephone Encounter (Signed)
 Pharmacy Patient Advocate Encounter  Received notification from [facility] [employer] that Prior Authorization for Brovana  15MCG/2ML nebulizer solution has been DENIED.  Full denial letter will be uploaded to the media tab. See denial reason below.   PA #/Case ID/Reference #: [id number]

## 2024-09-01 NOTE — Progress Notes (Signed)
 Physical Therapy Treatment Patient Details Name: Leon Estes MRN: [id number] DOB: 1931-03-08 Today's Date: 09/01/2024   History of Present Illness 89 year old male with recent hospitalization and discharge on 08/24/2024 for altered mental status presented to the hospital with increasing confusion disorientation. Dx of AKI, metabolic encephalopathy. Pt with past medical history of hypertension, paroxysmal atrial fibrillation not on anticoagulation, hyperlipidemia, CKD, COPD, dementia    PT Comments  Pt was in bed with B wrist restraints. AxO x 2 pleasant but very HOH.  Following repeat functional commands.  Able to recall he was in the hospital. Removed restraints and assisted OOB to amb.  General transfer comment: Pt was self able to rise from elevated bed minimal use of hands but did steady self with back of legs against bed.  Unsteady with turns requiring repeat VC's foe safety and completion. General Gait Details: assisted with amb RW at Contact Guard with VC's to correct walker to self distance as he tended to push too far out.  VC's for direction and turn completion. Mod c/o fatigue and mild dyspnea.  Pt stated, I have not walked in a few days. Returned to room and left in recliner with alarm active, notified Nurse. Pt plans to return home with Family support.  LPT has rec HH, NO equipment.      If plan is discharge home, recommend the following: A little help with walking and/or transfers;A little help with bathing/dressing/bathroom;Assistance with cooking/housework;Assist for transportation;Help with stairs or ramp for entrance   Can travel by private vehicle     No  Equipment Recommendations  None recommended by PT    Recommendations for Other Services       Precautions / Restrictions Precautions Precautions: Fall Precaution/Restrictions Comments: Hx Dementia with Sundowners Restrictions Weight Bearing Restrictions Per Provider Order: No     Mobility  Bed  Mobility Overal bed mobility: Needs Assistance Bed Mobility: Supine to Sit     Supine to sit: Supervision     General bed mobility comments: able to transition to EOB without Physical Assist and repeat functional VC's    Transfers Overall transfer level: Needs assistance Equipment used: Rolling walker (2 wheels) Transfers: Sit to/from Stand Sit to Stand: Supervision           General transfer comment: Pt was self able to rise from elevated bed minimal use of hands but did steady self with back of legs against bed.  Unsteady with turns requiring repeat VC's foe safety and completion.    Ambulation/Gait Ambulation/Gait assistance: Contact guard assist Gait Distance (Feet): 90 Feet (45 feet x 2) Assistive device: Rolling walker (2 wheels) Gait Pattern/deviations: Step-to pattern, Decreased step length - left, Decreased step length - right, Trunk flexed Gait velocity: decreased     General Gait Details: assisted with amb RW at [employer] with VC's to correct walker to self distance as he tended to push too far out.  VC's for direction and turn completion. Mod c/o fatigue and mild dyspnea.  Pt stated, I have not walked in a few days.   Stairs             Wheelchair Mobility     Tilt Bed    Modified Rankin (Stroke Patients Only)       Balance  Communication Communication Communication: Impaired Factors Affecting Communication: Hearing impaired;Reduced clarity of speech  Cognition Arousal: Alert Behavior During Therapy: WFL for tasks assessed/performed   PT - Cognitive impairments: Memory, Sequencing, Problem solving, Safety/Judgement, History of cognitive impairments, No family/caregiver present to determine baseline   Orientation impairments: Person, Place                   PT - Cognition Comments: AxO x 2 pleasant but very HOH.  Following repeat functional commands.  Able to recall  he was in the hospital. Following commands: Intact Following commands impaired: Follows one step commands inconsistently    Cueing Cueing Techniques: Verbal cues, Tactile cues  Exercises      General Comments        Pertinent Vitals/Pain Pain Assessment Pain Assessment: No/denies pain    Home Living                          Prior Function            PT Goals (current goals can now be found in the care plan section) Progress towards PT goals: Progressing toward goals    Frequency    Min 2X/week      PT Plan      Co-evaluation              AM-PAC PT 6 Clicks Mobility   Outcome Measure  Help needed turning from your back to your side while in a flat bed without using bedrails?: A Little Help needed moving from lying on your back to sitting on the side of a flat bed without using bedrails?: A Little Help needed moving to and from a bed to a chair (including a wheelchair)?: A Little Help needed standing up from a chair using your arms (e.g., wheelchair or bedside chair)?: A Little Help needed to walk in hospital room?: A Little Help needed climbing 3-5 steps with a railing? : A Lot 6 Click Score: 17    End of Session Equipment Utilized During Treatment: Gait belt Activity Tolerance: Patient tolerated treatment well Patient left: in chair;with chair alarm set;with call bell/phone within reach Nurse Communication: Mobility status;Other (comment) (in recliner without restraints) PT Visit Diagnosis: Unsteadiness on feet (R26.81);Muscle weakness (generalized) (M62.81);Other abnormalities of gait and mobility (R26.89)     Time: 8467-8441 PT Time Calculation (min) (ACUTE ONLY): 26 min  Charges:    $Gait Training: 8-22 mins $Therapeutic Activity: 8-22 mins PT General Charges $$ ACUTE PT VISIT: 1 Visit                     {Nghia Mcentee  PTA Acute  Rehabilitation Services Office M-F          [phone]

## 2024-09-01 NOTE — Progress Notes (Incomplete)
 Order was discontinue

## 2024-09-01 NOTE — Progress Notes (Signed)
 " PROGRESS NOTE  Leon Estes FMW:[id number] DOB: February 19, 1931   PCP: Leon Estes POUR, MD  Patient is from: Home.  Lives with family.  DOA: 08/27/2024 LOS: 4  Chief complaints Chief Complaint  Patient presents with   Weakness     Brief Narrative / Interim history: 89 year old M with PMH of cognitive impairment, HFmrEF, COPD, CKD, PAF not on anticoagulation, dysphagia, HTN and recent hospitalization from 1/6-1/12 for acute metabolic encephalopathy, AKI, dehydration and electrolyte abnormalities return to the hospital due to increased confusion, disorientation and decreased oral intake for the last 3 to 4 days.  Spouse unable to care for patient and they brought patient back.  In ED, slightly tachypneic.  Other vital stable.  COVID-19, influenza and RSV PCR nonreactive.  UA does not suggest UTI. Cr 1.3 (baseline 0.8).  WBC 14.1.  Lactic acid 1.6.  CXR with some atelectasis.  CT abdomen and pelvis without acute finding but showed RLL atelectasis, hepatic steatosis, stable 3.1 left renal cyst, moderate prostate enlargement, 9 x 9 mm lung nodule and aortic sclerosis.  Patient was admitted with working diagnosis of  physical and mental decline with encephalopathy , generalized weakness.  On further discussion with patient's daughter and wife, patient with no formal diagnosis of dementia but some forgetfulness and tendency to ask the same question repeatedly.  Otherwise, functional driving his golf car, feeding his deer and helping with dishes... as late as last month.  Family noted significant decline after they put down their dog on 12/31.   Patient with waxing and waning mental status.  Family working on caregiver before taking patient home.  Palliative medicine following.  Plan for discharge on Thursday when out oral care will be able to follow-up.  Subjective: Seen and examined earlier this morning.  No major events overnight or this morning.  Continues to require bilateral wrist restraints.   Sleepy but wakes to voice.  Difficult to assess orientation.   Assessment and plan: Delirium with possible mild underlying cognitive impairment: Pretty functional as of last months until they put down their dog on 12/31.  Encephalopathy workup on 1/7 including MRI brain, B12, RPR, TSH and ammonia unrevealing.  He was on significant dose of gabapentin  with reduced renal clearance which might contribute.  He is currently confused requiring pharmacologic and soft restraints. My concern is that his delirium might not get better in the hospital. -Decreased night Seroquel  to 12.5 mg and added melatonin on 1/17. -Reorientation and delirium precaution -Continue IV Haldol  as needed. -Try him off soft restraints daily -Appreciate help by palliative. -SLP recommended dysphagia 1 diet early in the course. -Family working on care giver     Acute kidney injury: Likely prerenal in the setting of poor p.o. intake.  b/l Cr ~0.9.  AKI resolved. Recent Labs    08/19/24 0502 08/20/24 0432 08/21/24 0349 08/22/24 0335 08/23/24 0316 08/24/24 0415 08/27/24 0950 08/28/24 0447 08/29/24 0438 08/30/24 0431  BUN 14 11 15 18 21 17  31* 20 15 16   CREATININE 1.16 0.97 0.91 1.07 1.10 0.83 1.35* 0.99 0.90 0.99  - IV fluid intermittently. - Avoid diuretics due to risk of dehydration    Generalized weakness, failure to thrive: Recurrent hospitalization due to delirium and poor p.o. intake.  Seems to have advanced dementia.  Appropriately DNR. -Therapy recommended SNF -Palliative consult for further goal of care discussion   Paroxysmal atrial fibrillation: Rate controlled.  Not on anticoagulation. -Coreg  as above.   History of hypertension: BP elevated  but difficult  to get accurate measurement due to patient's delirium. -Continue Coreg  6.25 mg twice daily. -Continue losartan  50 mg daily -Avoid diuretics due to risk of dehydration  Chronic HFmrEF: TTE in 2023 with LVEF of 45 to 50%, GH, G1 DD, severe LAE.   Appears euvolemic on exam. - Monitor fluid and respiratory status while on IV fluid  Chronic COPD: Stable. - Optimize nebulizers and discontinue oxygen.  Mild leukocytosis: Likely reactive/demargination.  Low suspicion for infection.  Improved without antibiotics.  Mild rhabdomyolysis: CK 1100>> 400> 161  Hyperlipidemia - Doubt utility of statin for primary prevention given his age.  Elevated AST: Likely due to rhabdo.  Improved. - Continue monitoring  Pulmonary nodule: CT showed 9 x 9 mm RLL nodule.  Radiology recommended noncontrast CT chest for complete evaluation of lung disease on elective basis.  Given his age and comorbidity, I doubt there is any utility of CT chest.  BPH: CT showed moderate prostatic enlargement. - Continue home Flomax  - Bladder scan as needed. - Strict intake and output  Body mass index is 21.09 kg/m.         Pressure skin injury: Present on admission. Wound 08/27/24 2100 Pressure Injury Buttocks Right;Left Stage 1 -  Intact skin with non-blanchable redness of a localized area usually over a bony prominence. (Active)   DVT prophylaxis:  heparin  injection 5,000 Units Start: 08/27/24 2200  Code Status: DNR Family Communication: Updated one of patient's wife and daughter at bedside. Level of care: Med-Surg Status is: Inpatient The patient will remain inpatient because: Dementia with behavioral disturbance/delirium, failure to thrive, generalized weakness   Final disposition: SNF   35 minutes with more than 50% spent in reviewing records, counseling patient/family and coordinating care.  Consultants:  Palliative medicine  Procedures: None  Microbiology summarized: COVID-19, influenza and RSV PCR nonreactive  Objective: Vitals:   08/31/24 2135 09/01/24 0500 09/01/24 0901 09/01/24 1342  BP:    (!) 140/67  Pulse:    68  Resp:    16  Temp:    (!) 97.5 F (36.4 C)  TempSrc:    Oral  SpO2: 96%  94% 96%  Weight:  62.9 kg    Height:         Examination:  GENERAL: No apparent distress.  Nontoxic.  Appears calm. HEENT: MMM.  Vision and hearing grossly intact.  NECK: Supple.  No apparent JVD.  RESP:  No IWOB.  Fair aeration bilaterally. CVS:  RRR. Heart sounds normal.  ABD/GI/GU: BS+. Abd soft, NTND.  MSK/EXT:  Moves extremities. No apparent deformity. No edema.  Soft restraints on bilateral wrist. SKIN: no apparent skin lesion or wound NEURO: Sleepy but wakes to voice.  Difficult to assess orientation.  Follows commands.  No apparent focal neuro deficit.  Sch Meds:  Scheduled Meds:  arformoterol   15 mcg Nebulization BID   budesonide   0.5 mg Nebulization BID   carvedilol   6.25 mg Oral BID WC   feeding supplement  237 mL Oral BID BM   heparin   5,000 Units Subcutaneous Q8H   losartan   50 mg Oral Daily   melatonin  5 mg Oral QHS   pantoprazole   40 mg Oral Daily   QUEtiapine   12.5 mg Oral QHS   revefenacin   175 mcg Nebulization Daily   senna-docusate  2 tablet Oral Daily   sodium chloride  flush  3 mL Intravenous Q12H   tamsulosin   0.4 mg Oral Daily   Continuous Infusions:    PRN Meds:.acetaminophen  **OR** acetaminophen , haloperidol  lactate,  hydrALAZINE , ipratropium-albuterol , ondansetron  **OR** ondansetron  (ZOFRAN ) IV, [EXPIRED] polyethylene glycol **FOLLOWED BY** polyethylene glycol, sodium chloride  flush  Antimicrobials: Anti-infectives (From admission, onward)    None        I have personally reviewed the following labs and images: CBC: Recent Labs  Lab 08/27/24 0950 08/28/24 0447 08/29/24 0438 08/30/24 0431  WBC 14.1* 11.9* 10.0 7.3  HGB 11.7* 12.1* 11.2* 10.6*  HCT 37.1* 38.1* 35.4* 33.0*  MCV 94.4 92.9 92.4 92.2  PLT 316 295 297 269   BMP &GFR Recent Labs  Lab 08/27/24 0950 08/28/24 0447 08/29/24 0438 08/30/24 0431  NA 141 143 142 143  K 3.9 4.0 3.5 4.4  CL 104 105 103 106  CO2 28 27 28 25   GLUCOSE 121* 97 91 86  BUN 31* 20 15 16   CREATININE 1.35* 0.99 0.90 0.99  CALCIUM  9.4  9.8 9.5 9.4  MG  --  2.2 1.9 1.8  PHOS  --   --   --  2.0*   Estimated Creatinine Clearance: 43.2 mL/min (by C-G formula based on SCr of 0.99 mg/dL). Liver & Pancreas: Recent Labs  Lab 08/27/24 0950 08/28/24 0447 08/29/24 0438 08/30/24 0431  AST 81* 99* 82*  --   ALT 45* 47* 45*  --   ALKPHOS 71 81 77  --   BILITOT 0.3 0.4 0.5  --   PROT 6.7 7.3 6.6  --   ALBUMIN  3.3* 3.5 3.3* 3.0*   No results for input(s): LIPASE, AMYLASE in the last 168 hours. No results for input(s): AMMONIA in the last 168 hours. Diabetic: No results for input(s): HGBA1C in the last 72 hours. Recent Labs  Lab 08/27/24 1030  GLUCAP 97   Cardiac Enzymes: Recent Labs  Lab 08/28/24 0447 08/29/24 0438 08/30/24 0431  CKTOTAL 1,145* 399* 161   No results for input(s): PROBNP in the last 8760 hours. Coagulation Profile: No results for input(s): INR, PROTIME in the last 168 hours. Thyroid  Function Tests: No results for input(s): TSH, T4TOTAL, FREET4, T3FREE, THYROIDAB in the last 72 hours.  Lipid Profile: No results for input(s): CHOL, HDL, LDLCALC, TRIG, CHOLHDL, LDLDIRECT in the last 72 hours. Anemia Panel: No results for input(s): VITAMINB12, FOLATE, FERRITIN, TIBC, IRON, RETICCTPCT in the last 72 hours. Urine analysis:    Component Value Date/Time   COLORURINE YELLOW 08/27/2024 1054   APPEARANCEUR CLEAR 08/27/2024 1054   APPEARANCEUR Clear 04/29/2013 1543   LABSPEC 1.026 08/27/2024 1054   LABSPEC 1.020 04/29/2013 1543   PHURINE 5.0 08/27/2024 1054   GLUCOSEU NEGATIVE 08/27/2024 1054   GLUCOSEU Negative 04/29/2013 1543   HGBUR SMALL (A) 08/27/2024 1054   BILIRUBINUR NEGATIVE 08/27/2024 1054   BILIRUBINUR Negative 03/14/2022 1506   BILIRUBINUR Negative 04/29/2013 1543   KETONESUR NEGATIVE 08/27/2024 1054   PROTEINUR 100 (A) 08/27/2024 1054   UROBILINOGEN 0.2 03/14/2022 1506   UROBILINOGEN 0.2 03/08/2012 1624   NITRITE NEGATIVE 08/27/2024  1054   LEUKOCYTESUR NEGATIVE 08/27/2024 1054   LEUKOCYTESUR Negative 04/29/2013 1543   Sepsis Labs: Invalid input(s): PROCALCITONIN, LACTICIDVEN  Microbiology: Recent Results (from the past 240 hours)  Resp panel by RT-PCR (RSV, Flu A&B, Covid) Anterior Nasal Swab     Status: None   Collection Time: 08/27/24 12:40 PM   Specimen: Anterior Nasal Swab  Result Value Ref Range Status   SARS Coronavirus 2 by RT PCR NEGATIVE NEGATIVE Final    Comment: (NOTE) SARS-CoV-2 target nucleic acids are NOT DETECTED.  The SARS-CoV-2 RNA is generally detectable in upper respiratory specimens  during the acute phase of infection. The lowest concentration of SARS-CoV-2 viral copies this assay can detect is 138 copies/mL. A negative result does not preclude SARS-Cov-2 infection and should not be used as the sole basis for treatment or other patient management decisions. A negative result may occur with  improper specimen collection/handling, submission of specimen other than nasopharyngeal swab, presence of viral mutation(s) within the areas targeted by this assay, and inadequate number of viral copies(<138 copies/mL). A negative result must be combined with clinical observations, patient history, and epidemiological information. The expected result is Negative.  Fact Sheet for Patients:  bloggercourse.com  Fact Sheet for Healthcare Providers:  seriousbroker.it  This test is no t yet approved or cleared by the [place]  FDA and  has been authorized for detection and/or diagnosis of SARS-CoV-2 by FDA under an Emergency Use Authorization (EUA). This EUA will remain  in effect (meaning this test can be used) for the duration of the COVID-19 declaration under Section 564(b)(1) of the Act, 21 U.S.C.section 360bbb-3(b)(1), unless the authorization is terminated  or revoked sooner.       Influenza A by PCR NEGATIVE NEGATIVE Final   Influenza  B by PCR NEGATIVE NEGATIVE Final    Comment: (NOTE) The Xpert Xpress SARS-CoV-2/FLU/RSV plus assay is intended as an aid in the diagnosis of influenza from Nasopharyngeal swab specimens and should not be used as a sole basis for treatment. Nasal washings and aspirates are unacceptable for Xpert Xpress SARS-CoV-2/FLU/RSV testing.  Fact Sheet for Patients: bloggercourse.com  Fact Sheet for Healthcare Providers: seriousbroker.it  This test is not yet approved or cleared by the [place]  FDA and has been authorized for detection and/or diagnosis of SARS-CoV-2 by FDA under an Emergency Use Authorization (EUA). This EUA will remain in effect (meaning this test can be used) for the duration of the COVID-19 declaration under Section 564(b)(1) of the Act, 21 U.S.C. section 360bbb-3(b)(1), unless the authorization is terminated or revoked.     Resp Syncytial Virus by PCR NEGATIVE NEGATIVE Final    Comment: (NOTE) Fact Sheet for Patients: bloggercourse.com  Fact Sheet for Healthcare Providers: seriousbroker.it  This test is not yet approved or cleared by the [place]  FDA and has been authorized for detection and/or diagnosis of SARS-CoV-2 by FDA under an Emergency Use Authorization (EUA). This EUA will remain in effect (meaning this test can be used) for the duration of the COVID-19 declaration under Section 564(b)(1) of the Act, 21 U.S.C. section 360bbb-3(b)(1), unless the authorization is terminated or revoked.  Performed at [facility], [address] [place], [place] [address]     Radiology Studies: No results found.     Gianluca Chhim T. Johnwesley [facility]  If 7PM-7AM, please contact night-coverage [email or web address] 09/01/2024, 2:24 PM   "

## 2024-09-01 NOTE — Progress Notes (Signed)
 "                                                                                                                                                                                                          Daily Progress Note   Patient Name: Leon Estes       Date: 09/01/2024 DOB: 1930/10/12  Age: 89 y.o. MRN#: [id number] Attending Physician: Kathrin Mignon DASEN, MD Primary Care Physician: Bennett Reuben POUR, MD Admit Date: 08/27/2024  Reason for Consultation/Follow-up: Establishing goals of care  Subjective:   Confused, awake but confused, wearing restraints.   Length of Stay: 4  Current Medications: Scheduled Meds:   arformoterol   15 mcg Nebulization BID   budesonide   0.5 mg Nebulization BID   carvedilol   6.25 mg Oral BID WC   feeding supplement  237 mL Oral BID BM   heparin   5,000 Units Subcutaneous Q8H   losartan   50 mg Oral Daily   melatonin  5 mg Oral QHS   pantoprazole   40 mg Oral Daily   QUEtiapine   12.5 mg Oral QHS   revefenacin   175 mcg Nebulization Daily   senna-docusate  2 tablet Oral Daily   sodium chloride  flush  3 mL Intravenous Q12H   tamsulosin   0.4 mg Oral Daily    Continuous Infusions:   PRN Meds: acetaminophen  **OR** acetaminophen , haloperidol  lactate, hydrALAZINE , ipratropium-albuterol , ondansetron  **OR** ondansetron  (ZOFRAN ) IV, [EXPIRED] polyethylene glycol **FOLLOWED BY** polyethylene glycol, sodium chloride  flush  Physical Exam         Awake alert Confused and mildly restless Wearing soft restraints No edema  Vital Signs: BP (!) 140/67 (BP Location: Right Arm)   Pulse 68   Temp (!) 97.5 F (36.4 C) (Oral)   Resp 16   Ht 5' 8 (1.727 m)   Wt 62.9 kg   SpO2 96%   BMI 21.09 kg/m  SpO2: SpO2: 96 % O2 Device: O2 Device: Room Air O2 Flow Rate: O2 Flow Rate (L/min): 2 L/min  Intake/output summary: No intake or output data in the 24 hours ending 09/01/24 1631 LBM: Last BM Date :  (UTA) Baseline Weight: Weight: 68.5 kg Most recent weight: Weight:  62.9 kg       Palliative Assessment/Data:      Patient Active Problem List   Diagnosis Date Noted   Delirium 08/30/2024   Cognitive impairment 08/29/2024   DNR (do not resuscitate) 08/29/2024   Decreased oral intake 08/29/2024   Counseling and coordination of care 08/29/2024   Goals of care, counseling/discussion 08/29/2024   Generalized weakness  08/29/2024   Palliative care encounter 08/29/2024   Acute encephalopathy 08/19/2024   SIRS (systemic inflammatory response syndrome) (HCC) 08/19/2024   Left maxillary sinus opacification 08/19/2024   Vascular dementia without behavioral disturbance, psychotic disturbance, mood disturbance, or anxiety (HCC) 04/09/2023   Osteoarthritis of both hands 01/02/2023   Chronic cholecystitis 08/01/2022   Chronic combined systolic (congestive) and diastolic (congestive) heart failure (HCC) 08/01/2022   Gallstones 07/02/2022   NSVT (nonsustained ventricular tachycardia) (HCC)    Chronic heart failure with mildly reduced ejection fraction (HFmrEF) (HCC) 10/12/2021   PAF (paroxysmal atrial fibrillation) (HCC) 10/12/2021   Reflux esophagitis 09/28/2021   Neurodermatitis 09/15/2021   AKI (acute kidney injury) 07/25/2021   Allergic rhinitis due to pollen 06/12/2021   GERD (gastroesophageal reflux disease) 05/04/2021   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) 04/07/2021   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   HOH (hard of hearing) 12/28/2020   Pulmonary nodule 12/06/2020   Preventative health care 10/12/2020   Macular degeneration, wet (HCC) 10/12/2020   BPH (benign prostatic hyperplasia) 01/13/2020   Neuropathy 02/18/2018   COPD (chronic obstructive pulmonary disease) (HCC)    Benign essential HTN 04/09/2017   Atherosclerotic heart disease of native coronary artery with angina pectoris 03/03/2015   Hyperlipidemia 03/03/2015    Palliative Care Assessment & Plan   Patient Profile: Delirium on  possible underlying cognitive impairment Patient previously functional until recent psychosocial stressor (loss of family pet on 12/31). Extensive encephalopathy workup on 1/7 including MRI brain, B12, RPR, TSH, and ammonia was unrevealing. Delirium likely multifactorial, including medication effects (gabapentin  with reduced renal clearance), dehydration, and hospitalization. Despite interventions, patient remains significantly confused with concern that delirium may not fully resolve during this admission. Seroquel  reduced to 12.5 mg nightly; melatonin added on 1/17 Continue reorientation strategies and delirium precautions Continue IV Haldol  as needed for agitation Ongoing monitoring of mental status.   Goals of Care / Disposition Patient has DNR status. Given persistent delirium, recurrent hospitalizations, and declining functional status, prognosis and care needs were reviewed.   Generalized weakness / failure to thrive Recurrent hospitalizations with delirium and poor oral intake. Clinical picture concerning for advanced dementia with significant functional decline. Patient is appropriately DNR. Plan to get home with 24/7 caregivers with home health and [facility].    Code Status:    Code Status Orders  (From admission, onward)           Start     Ordered   08/27/24 1658  Do not attempt resuscitation (DNR)- Limited -Do Not Intubate (DNI)  Continuous       Question Answer Comment  If pulseless and not breathing No CPR or chest compressions.   In Pre-Arrest Conditions (Patient Is Breathing and Has A Pulse) Do not intubate. Provide all appropriate non-invasive medical interventions. Avoid ICU transfer unless indicated or required.   Consent: Discussion documented in EHR or advanced directives reviewed      08/27/24 1700           Code Status History     Date Active Date Inactive Code Status Order ID Comments User Context   08/19/2024 [id number] 08/24/2024 2350 Limited: Do not attempt  resuscitation (DNR) -DNR-LIMITED -Do Not Intubate/DNI  [id number]  Charlton Evalene RAMAN, MD ED   08/02/2023 1237 08/04/2023 1928 Limited: Do not attempt resuscitation (DNR) -DNR-LIMITED -Do Not Intubate/DNI  [id number]  Laurita Cort DASEN, MD ED   08/01/2022 2044 08/04/2022 2210 DNR [id number]  Tobie Jorie SAUNDERS, MD ED  10/10/2021 0318 10/17/2021 1650 DNR [id number]  Osa Kelly CROME, MD ED   10/10/2021 0110 10/10/2021 0318 DNR [id number]  Chotiner, Adine RAMAN, MD ED   07/24/2021 1019 07/29/2021 1940 DNR [id number]  Barbarann Nest, MD ED   04/19/2021 0010 04/30/2021 1903 DNR [id number]  Lonzell Emeline HERO, DO ED   12/29/2020 1001 01/23/2021 2001 DNR [id number]  Cheryle Page, MD Inpatient   12/27/2020 2202 12/29/2020 1001 Full Code [id number]  Tobie Jorie SAUNDERS, MD ED   11/26/2020 1947 11/29/2020 1648 Full Code [id number]  Shona Terry SAILOR, DO ED       Prognosis:  Unable to determine  Discharge Planning: To Be Determined  Care plan was discussed with  IDT.   Thank you for allowing the Palliative Medicine Team to assist in the care of this patient. I personally spent a total of 35 minutes in the care of the patient today including preparing to see the patient, getting/reviewing separately obtained history, performing a medically appropriate exam/evaluation, counseling and educating, and coordinating care.      Greater than 50%  of this time was spent counseling and coordinating care related to the above assessment and plan.  Lonia Serve, MD  Please contact Palliative Medicine Team phone at [phone] for questions and concerns.       "

## 2024-09-01 NOTE — Plan of Care (Signed)
   Problem: Education: Goal: Knowledge of General Education information will improve Description Including pain rating scale, medication(s)/side effects and non-pharmacologic comfort measures Outcome: Progressing   Problem: Clinical Measurements: Goal: Ability to maintain clinical measurements within normal limits will improve Outcome: Progressing   Problem: Clinical Measurements: Goal: Will remain free from infection Outcome: Progressing

## 2024-09-01 NOTE — Evaluation (Signed)
 Clinical/Bedside Swallow Evaluation Patient Details  Name: Leon Estes MRN: [id number] Date of Birth: 1931/01/13  Today's Date: 09/01/2024 Time: SLP Start Time (ACUTE ONLY): 0954 SLP Stop Time (ACUTE ONLY): 1030 SLP Time Calculation (min) (ACUTE ONLY): 36 min  Past Medical History:  Past Medical History:  Diagnosis Date   Asthma    BPH (benign prostatic hypertrophy)    Cataract    Chronic renal disease, stage III (HCC)    Chronic sinusitis    COPD (chronic obstructive pulmonary disease) (HCC)    Coronary artery disease    2 stents   Gall stones    Hearing loss    DOES NOT WEAR HEARING AIDS   High cholesterol    Hypertension    Lactic acid acidosis 10/12/2021   Macular degeneration    right eye   Rash 12/04/2021   Septic shock (HCC) 10/10/2021   Past Surgical History:  Past Surgical History:  Procedure Laterality Date   APPENDECTOMY     CORONARY ANGIOPLASTY WITH STENT PLACEMENT  1998   2 stents   TRANSURETHRAL RESECTION OF PROSTATE     HPI:  89yo male admitted from home 08/28/23 with generalized weakness, increasing confusion, decreased PO intake. Suspect metabolic encephalopthy. CXR: mild BLL atelectasis. BSE 08/28/24: D1/thin, crushed meds.  PMH: dementia?, BPH, HTN, HLD, ischemic cardiomyopathy, PAFib, CKD3a, CAD, COPD. Hosp 1/6-12/26 with acute metabolic encephalopathy    Assessment / Plan / Recommendation  Clinical Impression  PLAN: Pt presents with cognitively based dysphagia with increased risk for chronic pharyngeal dysphagia, and oral dysphagia due to lack of dentition. Continue puree (Dys1) diet with thin liquids via CUP (No straw), crush meds if able, provide in puree if not crushable. Appropriateness for safe PO intake may be highly variable during the day, with changes in mentation. Proceed with caution given increased risk.  Pt sleeping upon arrival of SLP. Gradually awakened enough to provide PO trials. Pt is edentulous, speech largely unintelligible. Pt has  difficulty following commands consistently. Following oral care, pt accepted whole meds in puree (RN present), trials of thin liquid via cup and straw, puree, and solid textures. Pt chewed medications and required multiple boluses of puree and thin liquid to clear residue. Extended and disorganized oral prep of graham cracker with residue noted. No obvious oral issues with puree consistency. Pt exhibited cough response following thin liquid via straw. No cough when small individual sips of liquid via cup were given. Pt required 1:1 assist with all PO presentations.   Recommend continuing puree diet with thin liquid via CUP sip - no straw use. Rec meds crushed in puree if able. Safe swallow precautions updated at [facility]. ST will follow to assess diet tolerance and continue education. RN/MD informed.  SLP Visit Diagnosis: Dysphagia, unspecified (R13.10)    Aspiration Risk  Moderate aspiration risk;Risk for inadequate nutrition/hydration    Diet Recommendation Dysphagia 1 (Puree);Thin liquid    Liquid Administration via: Cup;No straw Medication Administration: Crushed with puree Supervision: Full supervision/cueing for compensatory strategies Compensations: Minimize environmental distractions;Slow rate;Small sips/bites;Lingual sweep for clearance of pocketing Postural Changes: Seated upright at 90 degrees;Remain upright for at least 30 minutes after po intake    Other Recommendations Oral Care Recommendations: Oral care BID     Swallow Evaluation Recommendations  Puree/Thin via cup - NO STRAWS, crush meds   Assistance Recommended at Discharge  Frequent supervision  Functional Status Assessment Patient has had a recent decline in their functional status and demonstrates the ability to make significant improvements  in function in a reasonable and predictable amount of time.  Frequency and Duration min 1 x/week  2 weeks       Prognosis Prognosis for improved oropharyngeal function: Fair Barriers  to Reach Goals: Cognitive deficits;Time post onset      Swallow Study   General Date of Onset: 08/27/24 HPI: 89yo male admitted from home 08/28/23 with generalized weakness, increasing confusion, decreased PO intake. Suspect metabolic encephalopthy. CXR: mild BLL atelectasis. BSE 08/28/24: D1/thin, crushed meds.  PMH: dementia?, BPH, HTN, HLD, ischemic cardiomyopathy, PAFib, CKD3a, CAD, COPD. Hosp 1/6-12/26 with acute metabolic encephalopathy Type of Study: Bedside Swallow Evaluation Previous Swallow Assessment: BSE 08/28/24: D1/thin, crushed meds. Diet Prior to this Study: Dysphagia 1 (pureed);Thin liquids (Level 0) Temperature Spikes Noted: No History of Recent Intubation: No Behavior/Cognition: Alert;Cooperative;Distractible;Requires cueing;Doesn't follow directions Oral Cavity Assessment: Within Functional Limits Oral Care Completed by SLP: Yes Oral Cavity - Dentition: Edentulous Vision: Functional for self-feeding Self-Feeding Abilities: Total assist Patient Positioning: Upright in bed Baseline Vocal Quality: Normal Volitional Cough: Strong Volitional Swallow: Able to elicit    Oral/Motor/Sensory Function Overall Oral Motor/Sensory Function: Generalized oral weakness   Ice Chips Ice chips: Not tested   Thin Liquid Presentation: Cup;Straw Other Comments: inconsistent ability to use straw    Nectar Thick Nectar Thick Liquid: Not tested   Honey Thick Honey Thick Liquid: Not tested   Puree Puree: Impaired Presentation: Spoon Oral Phase Impairments: Reduced lingual movement/coordination;Poor awareness of bolus Oral Phase Functional Implications: Oral residue   Solid     Solid: Impaired Oral Phase Impairments: Reduced lingual movement/coordination;Impaired mastication;Poor awareness of bolus Oral Phase Functional Implications: Oral residue      Tinita Brooker, Caprice Daring 09/01/2024,10:38 AM

## 2024-09-02 ENCOUNTER — Other Ambulatory Visit (HOSPITAL_COMMUNITY): Payer: Self-pay

## 2024-09-02 ENCOUNTER — Telehealth: Payer: Self-pay

## 2024-09-02 ENCOUNTER — Other Ambulatory Visit: Payer: Self-pay

## 2024-09-02 DIAGNOSIS — N179 Acute kidney failure, unspecified: Secondary | ICD-10-CM | POA: Diagnosis not present

## 2024-09-02 DIAGNOSIS — Z515 Encounter for palliative care: Secondary | ICD-10-CM

## 2024-09-02 DIAGNOSIS — R638 Other symptoms and signs concerning food and fluid intake: Secondary | ICD-10-CM

## 2024-09-02 DIAGNOSIS — R410 Disorientation, unspecified: Secondary | ICD-10-CM

## 2024-09-02 DIAGNOSIS — I480 Paroxysmal atrial fibrillation: Secondary | ICD-10-CM | POA: Diagnosis not present

## 2024-09-02 DIAGNOSIS — I10 Essential (primary) hypertension: Secondary | ICD-10-CM | POA: Diagnosis not present

## 2024-09-02 DIAGNOSIS — Z7189 Other specified counseling: Secondary | ICD-10-CM

## 2024-09-02 DIAGNOSIS — R531 Weakness: Secondary | ICD-10-CM

## 2024-09-02 DIAGNOSIS — R4189 Other symptoms and signs involving cognitive functions and awareness: Secondary | ICD-10-CM

## 2024-09-02 DIAGNOSIS — Z66 Do not resuscitate: Secondary | ICD-10-CM

## 2024-09-02 MED ORDER — QUETIAPINE FUMARATE 25 MG PO TABS
12.5000 mg | ORAL_TABLET | Freq: Every day | ORAL | 0 refills | Status: AC
  Filled 2024-09-02 (×2): qty 15, 30d supply, fill #0

## 2024-09-02 MED ORDER — MELATONIN 5 MG PO TABS: 5.0000 mg | ORAL_TABLET | Freq: Every day | ORAL | Status: AC

## 2024-09-02 MED ORDER — LOSARTAN POTASSIUM 50 MG PO TABS
50.0000 mg | ORAL_TABLET | Freq: Every day | ORAL | 0 refills | Status: AC
  Filled 2024-09-02 (×2): qty 90, 90d supply, fill #0

## 2024-09-02 NOTE — Discharge Summary (Signed)
 "  Physician Discharge Summary  Leon Estes FMW:[id number] DOB: 1930/10/14 DOA: 08/27/2024  PCP: Bennett Reuben POUR, MD  Admit date: 08/27/2024 Discharge date: 09/02/24  Admitted From: Home Disposition: Home with palliative care Recommendations for Outpatient Follow-up:  [facility] to assume PCP role at home Check CMP and CBC in 1 week Please follow up on the following pending results: None  Home Health: [facility]  Equipment/Devices: No need identified  Discharge Condition: Stable CODE STATUS: DNR   Follow-up Information     Bowa, Nahosi K, MD. Schedule an appointment as soon as possible for a visit in 1 week(s).   Specialty: Family Medicine Contact information: [address] [place] [address] [phone]                 Hospital course 89 year old M with PMH of cognitive impairment, HFmrEF, COPD, CKD, PAF not on anticoagulation, dysphagia, HTN and recent hospitalization from 1/6-1/12 for acute metabolic encephalopathy, AKI, dehydration and electrolyte abnormalities return to the hospital due to increased confusion, disorientation and decreased oral intake for the last 3 to 4 days.  Spouse unable to care for patient and they brought patient back.   In ED, slightly tachypneic.  Other vital stable.  COVID-19, influenza and RSV PCR nonreactive.  UA does not suggest UTI. Cr 1.3 (baseline 0.8).  WBC 14.1.  Lactic acid 1.6.  CXR with some atelectasis.  CT abdomen and pelvis without acute finding but showed RLL atelectasis, hepatic steatosis, stable 3.1 left renal cyst, moderate prostate enlargement, 9 x 9 mm lung nodule and aortic sclerosis.  Patient was admitted with working diagnosis of  physical and mental decline with encephalopathy , generalized weakness.   On further discussion with patient's daughter and wife, patient with no formal diagnosis of dementia but some forgetfulness and tendency to ask the same question repeatedly.  Otherwise, functional driving  his golf car, feeding his deer and helping with dishes... as late as last month.  Family noted significant decline after they put down their dog on 12/31.    Patient with waxing and waning mental status. However, he was awake and alert and oriented to self and family on the day of discharge.  He is discharged with palliative.   See individual problem list below for more.   Problems addressed during this hospitalization Delirium with possible mild underlying cognitive impairment: Pretty functional as of last months until they put down their dog on 12/31.  Encephalopathy workup on 1/7 including MRI brain, B12, RPR, TSH and ammonia unrevealing.  He was on significant dose of gabapentin  with reduced renal clearance which might contribute.  Delirium improved.  He was awake, alert and oriented to self and family -Decreased night Seroquel  to 12.5 mg and added melatonin on 1/17. -Reorientation and delirium precaution -Home gabapentin  and tramadol  held on admission due to encephalopathy and discontinued on discharge. -SLP recommended dysphagia 1 diet  -[facility] to assume care as PCP     Acute kidney injury: Likely prerenal in the setting of poor p.o. intake.  b/l Cr ~0.9.  AKI resolved.     Generalized weakness, failure to thrive: Recurrent hospitalization due to delirium and poor p.o. intake.  Seems to have advanced dementia.  Appropriately DNR.   Paroxysmal atrial fibrillation: Rate controlled.  Not on anticoagulation. -Continue Coreg    History of hypertension: BP elevated  but difficult to get accurate measurement due to patient's delirium. -Continue Coreg  6.25 mg twice daily. -Continue losartan  50 mg daily -Avoid diuretics due to  risk of dehydration   Chronic HFmrEF: TTE in 2023 with LVEF of 45 to 50%, GH, G1 DD, severe LAE.  Appears euvolemic on exam. - Monitor fluid and respiratory status while on IV fluid   Chronic COPD: Stable. - Continue home meds.   Mild leukocytosis: Likely  reactive/demargination.  Low suspicion for infection.  Resolved without antibiotics.   Mild rhabdomyolysis: CK 1100>> 400> 161   Hyperlipidemia - Doubt utility of statin for primary prevention given his age.   Elevated AST: Likely due to rhabdo.  Improved. - Recheck CMP in 1 week   Pulmonary nodule: CT showed 9 x 9 mm RLL nodule.  Radiology recommended noncontrast CT chest for complete evaluation of lung disease on elective basis.  Given his age and comorbidity, I doubt there is any utility of CT chest.   BPH: CT showed moderate prostatic enlargement. - Continue home Flomax     Body mass index is 21.55 kg/m.       Pressure skin injury: Present on admission Wound 08/27/24 2100 Pressure Injury Buttocks Right;Left Stage 1 -  Intact skin with non-blanchable redness of a localized area usually over a bony prominence. (Active)    Consultations: None  Time spent 35  minutes  Vital signs Vitals:   09/01/24 2048 09/01/24 2050 09/02/24 0500 09/02/24 1243  BP:    115/66  Pulse:    86  Temp:   Comment: patient refused morning vitals 98.4 F (36.9 C)  Resp:    18  Height:      Weight:   64.3 kg   SpO2: 93% 93%  91%  TempSrc:    Oral  BMI (Calculated):   21.56      Discharge exam  GENERAL: No apparent distress.  Nontoxic. HEENT: MMM.  Vision and hearing grossly intact.  NECK: Supple.  No apparent JVD.  RESP:  No IWOB.  Fair aeration bilaterally. CVS:  RRR. Heart sounds normal.  ABD/GI/GU: BS+. Abd soft, NTND.  MSK/EXT:  Moves extremities. No apparent deformity. No edema.  SKIN: no apparent skin lesion or wound NEURO: Awake and alert. Oriented to self and family.  No apparent focal neuro deficit. PSYCH: Calm. Normal affect.   Discharge Instructions Discharge Instructions     Discharge instructions   Complete by: As directed    It has been a pleasure taking care of you!  You were hospitalized due to confusion and delirium.  We have made adjustment to your medications  during this hospitalization.  Please review your new medication list and the directions on your medications before you take them.  Follow-up with your primary care doctor in 1 to 2 weeks or sooner if needed.   Take care,   Increase activity slowly   Complete by: As directed    No wound care   Complete by: As directed       Allergies as of 09/02/2024       Reactions   Augmentin  [amoxicillin -pot Clavulanate] Rash   Bacitracin-polymyxin B Swelling, Rash   Benzalkonium Chloride    Benzalkonium chloride is a quaternary ammonium antiseptic and disinfectant with actions and uses similar to those of other cationic surfactants. It is also used as an antimicrobial preservative for pharmaceutical products.   Neosporin [neomycin-bacitracin Zn-polymyx] Rash   Sulfamethoxazole -trimethoprim  Rash   Terbinafine And Related Rash        Medication List     STOP taking these medications    furosemide  20 MG tablet Commonly known as: LASIX    gabapentin  400  MG capsule Commonly known as: NEURONTIN    potassium chloride  10 MEQ tablet Commonly known as: KLOR-CON  M   simvastatin  40 MG tablet Commonly known as: ZOCOR    sucralfate  1 g tablet Commonly known as: Carafate    traMADol  50 MG tablet Commonly known as: ULTRAM        TAKE these medications    acetaminophen  500 MG tablet Commonly known as: TYLENOL  Take 1,000 mg by mouth every 6 (six) hours as needed for moderate pain (pain score 4-6) or headache.   arformoterol  15 MCG/2ML Nebu Commonly known as: BROVANA  Take 2 mLs (15 mcg total) by nebulization 2 (two) times daily.   Aspirin  Low Dose 81 MG tablet Generic drug: aspirin  EC Take 1 tablet (81 mg total) by mouth daily. Swallow whole.   budesonide  0.5 MG/2ML nebulizer solution Commonly known as: PULMICORT  Take 2 mLs (0.5 mg total) by nebulization in the morning and at bedtime. What changed: See the new instructions.   carvedilol  6.25 MG tablet Commonly known as: COREG  Take  1 tablet (6.25 mg total) by mouth 2 (two) times daily with a meal. What changed:  medication strength See the new instructions.   ipratropium-albuterol  0.5-2.5 (3) MG/3ML Soln Commonly known as: DUONEB TAKE THREE MLS (1 VIAL) BY NEBULIZATION THREE TO FOUR TIMES DAILY AS NEEDED What changed: additional instructions   losartan  50 MG tablet Commonly known as: COZAAR  Take 1 tablet (50 mg total) by mouth daily. Start taking on: September 03, 2024 What changed:  medication strength how much to take   melatonin 5 MG Tabs Take 1 tablet (5 mg total) by mouth at bedtime.   multivitamin with minerals Tabs tablet Take 1 tablet by mouth daily.   nitroGLYCERIN  0.4 MG SL tablet Commonly known as: NITROSTAT  Place 1 tablet (0.4 mg total) under the tongue every 5 (five) minutes as needed for chest pain.   ondansetron  4 MG disintegrating tablet Commonly known as: ZOFRAN -ODT Take 1 tablet (4 mg total) by mouth every 8 (eight) hours as needed for nausea or vomiting.   pantoprazole  40 MG tablet Commonly known as: PROTONIX  TAKE ONE TABLET (40 MG TOTAL) BY MOUTH DAILY.   polyethylene glycol powder 17 GM/SCOOP powder Commonly known as: MiraLax  Take 17 g by mouth 2 (two) times daily as needed for mild constipation.   QUEtiapine  25 MG tablet Commonly known as: SEROQUEL  Take 0.5 tablets (12.5 mg total) by mouth at bedtime. What changed: how much to take   senna-docusate 8.6-50 MG tablet Commonly known as: Senokot-S Take 1-2 tablets by mouth 2 (two) times daily between meals as needed for mild constipation or moderate constipation.   tamsulosin  0.4 MG Caps capsule Commonly known as: FLOMAX  TAKE ONE CAPSULE BY MOUTH ONCE DAILY   triamcinolone  cream 0.1 % Commonly known as: KENALOG  APPLY ONE APPLICATION TOPICALLY TWO TIMES DAILY AS NEEDED         Procedures/Studies:   CT ABDOMEN PELVIS W CONTRAST Result Date: 08/27/2024 CLINICAL DATA:  Abdominal pain and increasing weakness. EXAM: CT  ABDOMEN AND PELVIS WITH CONTRAST TECHNIQUE: Multidetector CT imaging of the abdomen and pelvis was performed using the standard protocol following bolus administration of intravenous contrast. RADIATION DOSE REDUCTION: This exam was performed according to the departmental dose-optimization program which includes automated exposure control, adjustment of the mA and/or kV according to patient size and/or use of iterative reconstruction technique. CONTRAST:  OMNIPAQUE  IOHEXOL  300 MG/ML  SOLN COMPARISON:  02/16/2023 FINDINGS: Lower chest: Mild cardiomegaly. Calcified plaque over the right coronary artery as  well as descending thoracic aorta. Lung bases demonstrate dependent atelectasis posterior left base. 9 x 9 mm nodule with possible linear associated density as this may represent scarring versus true nodule. This is larger compared to the prior study. Heterogeneous opacification over the right lower lobe which may be due to atelectasis although pneumonia is possible. Hepatobiliary: Gallbladder is contracted. Mild diffuse low-attenuation of the liver compared to the spleen compatible with a mild degree of steatosis. No focal liver mass. Biliary tree is unremarkable. Pancreas: Normal. Spleen: Normal. Adrenals/Urinary Tract: Adrenal glands are unremarkable. Kidneys are normal in size. Stable 3.1 cm cyst over the upper pole left kidney. No hydronephrosis or nephrolithiasis. Ureters and bladder are normal. Stomach/Bowel: Stomach is normal. Possible diverticula over the proximal duodenum unchanged. Small bowel is otherwise unremarkable. Prior appendectomy. Mild diverticulosis of the colon. Mild fecal retention over the rectosigmoid colon. Vascular/Lymphatic: Moderate calcified plaque over the abdominal aorta. Aorta is normal caliber. Remaining vascular structures are unremarkable. No adenopathy. Reproductive: Moderate prostatic enlargement measuring 5.1 x 6.7 x 6.2 cm. Other: No free fluid or focal inflammatory  change. Musculoskeletal: Degenerative change of the spine with multilevel disc disease over the lumbar spine from the L2-3 level to the L5-S1 level. Grade 1-2 anterolisthesis of L5 on S1 unchanged. IMPRESSION: 1. No acute findings in the abdomen/pelvis. 2. Heterogeneous opacification over the right lower lobe which may be due to atelectasis, although pneumonia is possible. 3. Mild diverticulosis of the colon. Mild fecal retention over the rectosigmoid colon. 4. Mild hepatic steatosis. 5. Stable 3.1 cm left renal cyst. 6. Moderate prostatic enlargement. 7. 9 x 9 mm nodule with possible linear associated density as this may represent scarring versus true nodule. This is larger compared to the prior study. Recommend noncontrast chest CT for complete evaluation of the lungs on elective basis. 8. Aortic atherosclerosis. Atherosclerotic coronary artery disease. Aortic Atherosclerosis (ICD10-I70.0). Electronically Signed   By: Toribio Agreste M.D.   On: 08/27/2024 12:58   DG Chest 2 View Result Date: 08/27/2024 CLINICAL DATA:  Increasing weakness. EXAM: CHEST - 2 VIEW COMPARISON:  08/21/2024 FINDINGS: Lungs are adequately inflated. Mild linear density over the bilateral lung bases likely atelectasis. No effusion. Cardiomediastinal silhouette and remainder the exam is unchanged. IMPRESSION: Mild bibasilar atelectasis. Electronically Signed   By: Toribio Agreste M.D.   On: 08/27/2024 10:20   DG CHEST PORT 1 VIEW Result Date: 08/21/2024 EXAM: 1 VIEW(S) XRAY OF THE CHEST 08/21/2024 10:29:39 PM COMPARISON: 08/19/2024 CLINICAL HISTORY: Acute respiratory distress FINDINGS: LUNGS AND PLEURA: There are some interstitial opacities in the lung bases, left greater than right, which may be related to mild infection or edema. No pleural effusion. No pneumothorax. HEART AND MEDIASTINUM: Aortic atherosclerosis. No acute abnormality of the cardiac and mediastinal silhouettes. BONES AND SOFT TISSUES: No acute osseous abnormality.  IMPRESSION: 1. Mild bibasilar interstitial opacities, left greater than right, which may reflect infection or edema. Electronically signed by: Greig Pique MD MD 08/21/2024 10:37 PM EST RP Workstation: [id number]   EEG adult Result Date: 08/20/2024 Shelton Arlin KIDD, MD     08/20/2024  5:27 PM Patient Name: VIKASH NEST MRN: [id number] Epilepsy Attending: Arlin KIDD Shelton Referring Physician/Provider: Raenelle Donalda CHRISTELLA, MD Date:  08/20/2024 Duration: 24.06 mins Patient history: 89yo M with ams. EEG to evaluate for seizure Level of alertness: Awake AEDs during EEG study: Ativan  Technical aspects: This EEG study was done with scalp electrodes positioned according to the 10-20 International system of electrode placement. Electrical activity was reviewed with  band pass filter of 1-70Hz , sensitivity of 7 uV/mm, display speed of 46mm/sec with a 60Hz  notched filter applied as appropriate. EEG data were recorded continuously and digitally stored.  Video monitoring was available and reviewed as appropriate. Description: The posterior dominant rhythm consists of 9 Hz activity of moderate voltage (25-35 uV) seen predominantly in posterior head regions, symmetric and reactive to eye opening and eye closing. Hyperventilation and photic stimulation were not performed.   IMPRESSION: This study is within normal limits. No seizures or epileptiform discharges were seen throughout the recording. A normal interictal EEG does not exclude the diagnosis of epilepsy. Arlin MALVA Krebs   DG CHEST PORT 1 VIEW Result Date: 08/19/2024 CLINICAL DATA:  Altered mental status EXAM: PORTABLE CHEST 1 VIEW COMPARISON:  Chest x-ray 08/19/2024, 08/02/2023 FINDINGS: No focal opacity, pleural effusion or pneumothorax. Stable cardiomediastinal silhouette with aortic atherosclerosis IMPRESSION: No active disease. Electronically Signed   By: Luke Bun M.D.   On: 08/19/2024 19:10   MR BRAIN WO CONTRAST Result Date: 08/19/2024 EXAM: MRI BRAIN WITHOUT  CONTRAST 08/19/2024 01:26:03 PM TECHNIQUE: Multiplanar multisequence MRI of the head/brain was performed without the administration of intravenous contrast. COMPARISON: CT head 08/18/2024. CLINICAL HISTORY: Mental status change, unknown cause. FINDINGS: BRAIN AND VENTRICLES: No acute infarct. No intracranial hemorrhage. No mass. No midline shift. No hydrocephalus. T2 and FLAIR hyperintensity in the periventricular and subcortical white matter compatible with mild chronic microvascular ischemic changes. Mild generalized volume loss. The sella is unremarkable. Normal flow voids. ORBITS: Right lens replacement. SINUSES AND MASTOIDS: Mucosal thickening throughout the left maxillary sinus resulting in complete opacification. Additional moderate mucosal thickening in the left ethmoid sinus. There is soft tissue extending through the left maxillary sinus ostium which could reflect an antrochoanal polyp. Recommend correlation with direct visualization. Small right mastoid effusion. BONES AND SOFT TISSUES: Normal marrow signal. No significant soft tissue abnormality. IMPRESSION: 1. No acute findings. 2. Mild chronic microvascular ischemic changes and mild generalized volume loss. 3. Complete left maxillary sinus opacification with soft tissue extending through the left maxillary sinus ostium, which could reflect an antrochoanal polyp. Recommend direct visualization. Electronically signed by: Donnice Mania MD 08/19/2024 02:05 PM EST RP Workstation: [id number]   DG Chest Port 1 View Result Date: 08/19/2024 EXAM: 1 VIEW(S) XRAY OF THE CHEST 08/19/2024 12:32:00 AM COMPARISON: None available. CLINICAL HISTORY: ALOC FINDINGS: LUNGS AND PLEURA: Mild peribronchial thickening and interstitial prominence. No focal pulmonary opacity. No pleural effusion. No pneumothorax. HEART AND MEDIASTINUM: Aortic atherosclerosis. No acute abnormality of the cardiac and mediastinal silhouettes. BONES AND SOFT TISSUES: No acute osseous abnormality.  IMPRESSION: 1. Mild peribronchial thickening and interstitial prominence could reflect bronchitis or atypypical infection. . Electronically signed by: Franky Crease MD 08/19/2024 12:34 AM EST RP Workstation: [id number]   CT HEAD WO CONTRAST Result Date: 08/18/2024 EXAM: CT HEAD WITHOUT CONTRAST 08/18/2024 09:56:07 PM TECHNIQUE: CT of the head was performed without the administration of intravenous contrast. Automated exposure control, iterative reconstruction, and/or weight based adjustment of the mA/kV was utilized to reduce the radiation dose to as low as reasonably achievable. COMPARISON: Comparison is made with head CT from 10/09/2021. CLINICAL HISTORY: Neuro deficit, acute, stroke suspected. FINDINGS: BRAIN AND VENTRICLES: No acute hemorrhage. No evidence of acute infarct. There is a small chronic lacunar infarct in the right parietal deep white matter. No old territorial infarct is seen. There is moderately developed cerebral atrophy, with small vessel disease and atrophic ventriculomegaly, relatively mild cerebellar volume loss. There are calcified plaques in the distal  left vertebral artery and both siphons. No hyperdense vessel is seen. No extra-axial collection. No mass effect or midline shift. ORBITS: Old right-sided lens replacement with otherwise negative orbits. SINUSES: There is a new polypoid mass which appears to extend from the left maxillary sinus cavity into the mid to lower left nasal passage and widens the left maxillary antrum. There is near complete opacification of the left maxillary sinus with fluid and mucosal disease. There is some demineralization noted in the left maxillary sinus walls, but no frank bone destruction. There is partial opacification of the left ethmoid air cells. Remaining visualized sinuses are clear. Left mastoids are clear. There is trace fluid in the right mastoid tip. SOFT TISSUES AND SKULL: No acute soft tissue abnormality. No skull fracture. IMPRESSION: 1. No acute  intracranial CT findings. 2. 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 Benign and malignant etiologies are both possible. ENT consult recommended. 3. Small chronic lacunar infarct in the right parietal deep white matter. 4. Moderate cerebral atrophy with small vessel disease and atrophic ventriculomegaly, with relatively mild cerebellar volume loss. Electronically signed by: Francis Quam MD 08/18/2024 10:21 PM EST RP Workstation: [id number]   CT Cervical Spine Wo Contrast Result Date: 08/18/2024 EXAM: CT CERVICAL SPINE WITHOUT CONTRAST 08/18/2024 09:59:26 PM TECHNIQUE: CT of the cervical spine was performed without the administration of intravenous contrast. Multiplanar reformatted images are provided for review. Automated exposure control, iterative reconstruction, and/or weight based adjustment of the mA/kV was utilized to reduce the radiation dose to as low as reasonably achievable. COMPARISON: None available. CLINICAL HISTORY: Polytrauma, blunt. FINDINGS: BONES AND ALIGNMENT: No acute fracture or traumatic malalignment. 2 mm of anterolisthesis of C2 on C3, C6 on C7, and C7 on T1. DEGENERATIVE CHANGES: Diffuse degenerative disc and facet disease. SOFT TISSUES: No prevertebral soft tissue swelling. LUNGS: Biapical scarring and emphysema noted in the lung apices. IMPRESSION: 1. No acute cervical spine fracture or traumatic malalignment. Electronically signed by: Franky Crease MD 08/18/2024 10:18 PM EST RP Workstation: [id number]       The results of significant diagnostics from this hospitalization (including imaging, microbiology, ancillary and laboratory) are listed below for reference.     Microbiology: Recent Results  (from the past 240 hours)  Resp panel by RT-PCR (RSV, Flu A&B, Covid) Anterior Nasal Swab     Status: None   Collection Time: 08/27/24 12:40 PM   Specimen: Anterior Nasal Swab  Result Value Ref Range Status   SARS Coronavirus 2 by RT PCR NEGATIVE NEGATIVE Final    Comment: (NOTE) SARS-CoV-2 target nucleic acids are NOT DETECTED.  The SARS-CoV-2 RNA is generally detectable in upper respiratory specimens during the acute phase of infection. The lowest concentration of SARS-CoV-2 viral copies this assay can detect is 138 copies/mL. A negative result does not preclude SARS-Cov-2 infection and should not be used as the sole basis for treatment or other patient management decisions. A negative result may occur with  improper specimen collection/handling, submission of specimen other than nasopharyngeal swab, presence of viral mutation(s) within the areas targeted by this assay, and inadequate number of viral copies(<138 copies/mL). A negative result must be combined with clinical observations, patient history, and epidemiological information. The expected result is Negative.  Fact Sheet for Patients:  bloggercourse.com  Fact Sheet for Healthcare Providers:  seriousbroker.it  This test is no t yet approved or cleared by the [place]  FDA and  has been authorized for detection and/or diagnosis of SARS-CoV-2 by FDA under an Emergency Use Authorization (EUA). This EUA will remain  in effect (meaning this test can be used) for the duration of the COVID-19 declaration under Section 564(b)(1) of the Act, 21 U.S.C.section 360bbb-3(b)(1), unless the authorization is terminated  or revoked sooner.       Influenza A by PCR NEGATIVE NEGATIVE Final   Influenza B by PCR NEGATIVE NEGATIVE Final    Comment: (NOTE) The Xpert Xpress SARS-CoV-2/FLU/RSV plus assay is intended as an aid in the diagnosis of influenza from Nasopharyngeal swab  specimens and should not be used as a sole basis for treatment. Nasal washings and aspirates are unacceptable for Xpert Xpress SARS-CoV-2/FLU/RSV testing.  Fact Sheet for Patients: bloggercourse.com  Fact Sheet for Healthcare Providers: seriousbroker.it  This test is not yet approved or cleared by the [place]  FDA and has been authorized for detection and/or diagnosis of SARS-CoV-2 by FDA under an Emergency Use Authorization (EUA). This EUA will remain in effect (meaning this test can be used) for the duration of the COVID-19 declaration under Section 564(b)(1) of the Act, 21 U.S.C. section 360bbb-3(b)(1), unless the authorization is terminated or revoked.     Resp Syncytial Virus by PCR NEGATIVE NEGATIVE Final    Comment: (NOTE) Fact Sheet for Patients: bloggercourse.com  Fact Sheet for Healthcare Providers: seriousbroker.it  This test is not yet approved or cleared by the [place]  FDA and has been authorized for detection and/or diagnosis of SARS-CoV-2 by FDA under an Emergency Use Authorization (EUA). This EUA will remain in effect (meaning this test can be used) for the duration of the COVID-19 declaration under Section 564(b)(1) of the Act, 21 U.S.C. section 360bbb-3(b)(1), unless the authorization is terminated or revoked.  Performed at [facility], [address], [place], [place] [address]      Labs:  CBC: Recent Labs  Lab 08/27/24 [phone] 08/28/24 0447 08/29/24 0438 08/30/24 0431  WBC 14.1* 11.9* 10.0 7.3  HGB 11.7* 12.1* 11.2* 10.6*  HCT 37.1* 38.1* 35.4* 33.0*  MCV 94.4 92.9 92.4 92.2  PLT 316 295 297 269   BMP &GFR Recent Labs  Lab 08/27/24 0950 08/28/24 0447 08/29/24 0438 08/30/24 0431  NA 141 143 142 143  K 3.9 4.0 3.5 4.4  CL 104 105 103 106  CO2 28 27 28 25   GLUCOSE 121* 97 91 86  BUN 31* 20 15 16   CREATININE  1.35* 0.99 0.90 0.99  CALCIUM  9.4 9.8 9.5 9.4  MG  --  2.2 1.9 1.8  PHOS  --   --   --  2.0*   Estimated Creatinine Clearance: 44.2 mL/min (by C-G formula based on SCr of 0.99 mg/dL). Liver & Pancreas: Recent Labs  Lab 08/27/24 0950 08/28/24 0447 08/29/24 0438 08/30/24 0431  AST 81* 99* 82*  --   ALT 45* 47* 45*  --   ALKPHOS 71 81 77  --   BILITOT 0.3 0.4 0.5  --   PROT 6.7 7.3 6.6  --   ALBUMIN  3.3* 3.5 3.3* 3.0*   No results for input(s): LIPASE, AMYLASE in the last 168 hours. No results for input(s): AMMONIA in the last 168 hours. Diabetic: No results for input(s): HGBA1C in the last 72 hours. Recent Labs  Lab 08/27/24 1030  GLUCAP 97  Cardiac Enzymes: Recent Labs  Lab 08/28/24 0447 08/29/24 0438 08/30/24 0431  CKTOTAL 1,145* 399* 161   No results for input(s): PROBNP in the last 8760 hours. Coagulation Profile: No results for input(s): INR, PROTIME in the last 168 hours. Thyroid  Function Tests: No results for input(s): TSH, T4TOTAL, FREET4, T3FREE, THYROIDAB in the last 72 hours. Lipid Profile: No results for input(s): CHOL, HDL, LDLCALC, TRIG, CHOLHDL, LDLDIRECT in the last 72 hours. Anemia Panel: No results for input(s): VITAMINB12, FOLATE, FERRITIN, TIBC, IRON, RETICCTPCT in the last 72 hours. Urine analysis:    Component Value Date/Time   COLORURINE YELLOW 08/27/2024 1054   APPEARANCEUR CLEAR 08/27/2024 1054   APPEARANCEUR Clear 04/29/2013 1543   LABSPEC 1.026 08/27/2024 1054   LABSPEC 1.020 04/29/2013 1543   PHURINE 5.0 08/27/2024 1054   GLUCOSEU NEGATIVE 08/27/2024 1054   GLUCOSEU Negative 04/29/2013 1543   HGBUR SMALL (A) 08/27/2024 1054   BILIRUBINUR NEGATIVE 08/27/2024 1054   BILIRUBINUR Negative 03/14/2022 1506   BILIRUBINUR Negative 04/29/2013 1543   KETONESUR NEGATIVE 08/27/2024 1054   PROTEINUR 100 (A) 08/27/2024 1054   UROBILINOGEN 0.2 03/14/2022 1506   UROBILINOGEN 0.2 03/08/2012 1624    NITRITE NEGATIVE 08/27/2024 1054   LEUKOCYTESUR NEGATIVE 08/27/2024 1054   LEUKOCYTESUR Negative 04/29/2013 1543   Sepsis Labs: Invalid input(s): PROCALCITONIN, LACTICIDVEN   SIGNED:  Sidney Kann T Erabella Kuipers, MD  [facility] 09/02/2024, 10:31 PM   "

## 2024-09-02 NOTE — Progress Notes (Signed)
 Speech Language Pathology Treatment: Dysphagia  Patient Details Name: Leon Estes MRN: [id number] DOB: 1931/05/07 Today's Date: 09/02/2024 Time: 0920-0930 SLP Time Calculation (min) (ACUTE ONLY): 10 min  Assessment / Plan / Recommendation Clinical Impression  Pt seen for skilled ST intervention targeting goal for safe swallow. Pt awake and alert this morning, being fed breakfast. Timely oral prep and clearing, and no overt s/s aspiration across multiple presentations. Pt continues to be at risk for aspiration with varying mentation, but appears safe to continue current diet. Safe swallow precautions posted at [facility]. ST signing off at this time. Please reconsult if needs arise.    HPI HPI: 89yo male admitted from home 08/28/23 with generalized weakness, increasing confusion, decreased PO intake. Suspect metabolic encephalopthy. CXR: mild BLL atelectasis. BSE 08/28/24: D1/thin, crushed meds.  PMH: dementia?, BPH, HTN, HLD, ischemic cardiomyopathy, PAFib, CKD3a, CAD, COPD. Hosp 1/6-12/26 with acute metabolic encephalopathy      SLP Plan  All goals met        Swallow Evaluation Recommendations    Continue puree diet/thin liquids, meds crushed in puree     Recommendations  Diet recommendations: Dysphagia 1 (puree);Thin liquid Liquids provided via: Straw;Cup Medication Administration: Crushed with puree Supervision: Full supervision/cueing for compensatory strategies;Trained caregiver to feed patient Compensations: Minimize environmental distractions;Slow rate;Small sips/bites;Lingual sweep for clearance of pocketing Postural Changes and/or Swallow Maneuvers: Seated upright 90 degrees;Upright 30-60 min after meal        Oral care BID     Dysphagia, unspecified (R13.10)     All goals met    Leon Estes, MSP, CCC-SLP Speech Language Pathologist  Estes Caprice Daring 09/02/2024, 10:02 AM

## 2024-09-02 NOTE — Plan of Care (Signed)
" °  Problem: Education: Goal: Knowledge of General Education information will improve Description: Including pain rating scale, medication(s)/side effects and non-pharmacologic comfort measures 09/02/2024 1312 by Hogan-Nutting, Burnard HERO, RN Outcome: Adequate for Discharge 09/02/2024 1312 by Reesa Burnard HERO, RN Outcome: Progressing   Problem: Health Behavior/Discharge Planning: Goal: Ability to manage health-related needs will improve 09/02/2024 1312 by Reesa Burnard HERO, RN Outcome: Adequate for Discharge 09/02/2024 1312 by Reesa Burnard HERO, RN Outcome: Progressing   Problem: Clinical Measurements: Goal: Ability to maintain clinical measurements within normal limits will improve 09/02/2024 1312 by Reesa Burnard HERO, RN Outcome: Adequate for Discharge 09/02/2024 1312 by Reesa Burnard HERO, RN Outcome: Progressing Goal: Will remain free from infection 09/02/2024 1312 by Reesa Burnard HERO, RN Outcome: Adequate for Discharge 09/02/2024 1312 by Reesa Burnard HERO, RN Outcome: Progressing Goal: Diagnostic test results will improve 09/02/2024 1312 by Reesa Burnard HERO, RN Outcome: Adequate for Discharge 09/02/2024 1312 by Reesa Burnard HERO, RN Outcome: Progressing Goal: Respiratory complications will improve 09/02/2024 1312 by Reesa Burnard HERO, RN Outcome: Adequate for Discharge 09/02/2024 1312 by Reesa Burnard HERO, RN Outcome: Progressing Goal: Cardiovascular complication will be avoided 09/02/2024 1312 by Reesa Burnard HERO, RN Outcome: Adequate for Discharge 09/02/2024 1312 by Reesa Burnard HERO, RN Outcome: Progressing   Problem: Activity: Goal: Risk for activity intolerance will decrease 09/02/2024 1312 by Hogan-Nutting, Burnard HERO, RN Outcome: Adequate for Discharge 09/02/2024 1312 by Reesa Burnard HERO, RN Outcome: Progressing   Problem: Nutrition: Goal: Adequate nutrition will be maintained 09/02/2024 1312 by  Reesa Burnard HERO, RN Outcome: Adequate for Discharge 09/02/2024 1312 by Reesa Burnard HERO, RN Outcome: Progressing   Problem: Coping: Goal: Level of anxiety will decrease 09/02/2024 1312 by Reesa Burnard HERO, RN Outcome: Adequate for Discharge 09/02/2024 1312 by Reesa Burnard HERO, RN Outcome: Progressing   Problem: Elimination: Goal: Will not experience complications related to bowel motility 09/02/2024 1312 by Reesa Burnard HERO, RN Outcome: Adequate for Discharge 09/02/2024 1312 by Reesa Burnard HERO, RN Outcome: Progressing Goal: Will not experience complications related to urinary retention 09/02/2024 1312 by Reesa Burnard HERO, RN Outcome: Adequate for Discharge 09/02/2024 1312 by Reesa Burnard HERO, RN Outcome: Progressing   Problem: Pain Managment: Goal: General experience of comfort will improve and/or be controlled 09/02/2024 1312 by Reesa Burnard HERO, RN Outcome: Adequate for Discharge 09/02/2024 1312 by Reesa Burnard HERO, RN Outcome: Progressing   Problem: Safety: Goal: Ability to remain free from injury will improve 09/02/2024 1312 by Reesa Burnard HERO, RN Outcome: Adequate for Discharge 09/02/2024 1312 by Reesa Burnard HERO, RN Outcome: Progressing   Problem: Skin Integrity: Goal: Risk for impaired skin integrity will decrease 09/02/2024 1312 by Reesa Burnard HERO, RN Outcome: Adequate for Discharge 09/02/2024 1312 by Reesa Burnard HERO, RN Outcome: Progressing   Problem: Safety: Goal: Non-violent Restraint(s) 09/02/2024 1312 by Reesa Burnard HERO, RN Outcome: Adequate for Discharge 09/02/2024 1312 by Reesa Burnard HERO, RN Outcome: Progressing   "

## 2024-09-02 NOTE — TOC Progression Note (Signed)
 Transition of Care [facility]) - Progression Note    Patient Details  Name: ANTHONIE LOTITO MRN: [id number] Date of Birth: [birth year]  Transition of [facility]) CM/SW Contact  Doneta Glenys DASEN, RN Phone Number: 09/02/2024, 1:13 PM  Clinical Narrative:    CM spoke with Amy (dlt) with decision to allow [facility] to provide [facility] service since with them for outpatient palliative service.       Expected Discharge Plan and Services         Expected Discharge Date: 09/02/24                                     Social Drivers of Health (SDOH) Interventions SDOH Screenings   Food Insecurity: Patient Unable To Answer (08/29/2024)  Housing: Unknown (08/29/2024)  Transportation Needs: Patient Unable To Answer (08/29/2024)  Utilities: Patient Unable To Answer (08/29/2024)  Depression (PHQ2-9): Low Risk (04/09/2024)  Financial Resource Strain: Medium Risk (04/03/2024)   Received from [facility]  Social Connections: Unknown (08/29/2024)  Recent Concern: Social Connections - Moderately Isolated (08/19/2024)  Tobacco Use: High Risk (08/19/2024)    Readmission Risk Interventions     No data to display

## 2024-09-02 NOTE — Plan of Care (Signed)

## 2024-09-02 NOTE — Plan of Care (Signed)

## 2024-09-02 NOTE — Progress Notes (Signed)
 Discharge meds in a secure bag delivered to patient by this RN

## 2024-09-02 NOTE — TOC Transition Note (Signed)
 Transition of Care [facility]) - Discharge Note   Patient Details  Name: Leon Estes MRN: [id number] Date of Birth: 02/11/1931  Transition of Care [facility]) CM/SW Contact:  Doneta Glenys DASEN, RN Phone Number: 09/02/2024, 2:12 PM   Clinical Narrative:    Patient [facility] and outpatient palliative care, Family will transport at discharge.    Final next level of care: Home w Home Health Services Barriers to Discharge: Barriers Resolved   Patient Goals and CMS Choice Patient states their goals for this hospitalization and ongoing recovery are:: home with [facility] CMS Medicare.gov Compare Post Acute Care list provided to:: Patient Choice offered to / list presented to : Patient  ownership interest in [facility]provided to:: Patient    Discharge Placement                       Discharge Plan and Services Additional resources added to the After Visit Summary for                  DME Arranged: N/A DME Agency: NA       HH Arranged: RN, PT, OT, Nurse's Aide HH Agency:  [facility]) Date [facility] Agency Contacted: 09/02/24   Representative spoke with at [facility] Agency: Amy  Social Drivers of Health (SDOH) Interventions SDOH Screenings   Food Insecurity: Patient Unable To Answer (08/29/2024)  Housing: Unknown (08/29/2024)  Transportation Needs: Patient Unable To Answer (08/29/2024)  Utilities: Patient Unable To Answer (08/29/2024)  Depression (PHQ2-9): Low Risk (04/09/2024)  Financial Resource Strain: Medium Risk (04/03/2024)   Received from [facility]  Social Connections: Unknown (08/29/2024)  Recent Concern: Social Connections - Moderately Isolated (08/19/2024)  Tobacco Use: High Risk (08/19/2024)     Readmission Risk Interventions     No data to display

## 2024-09-02 NOTE — Progress Notes (Signed)
 "                                                                                                                                                                                                          Daily Progress Note   Patient Name: Leon Estes       Date: 09/02/2024 DOB: 21-Apr-1931  Age: 89 y.o. MRN#: [id number] Attending Physician: Kathrin Mignon DASEN, MD Primary Care Physician: Bennett Reuben POUR, MD Admit Date: 08/27/2024  Reason for Consultation/Follow-up: Establishing goals of care  Subjective: Appears with generalized weakness, attempts to arouse when name is called, no non verbal gestures of distress or discomfort noted.   Length of Stay: 5  Current Medications: Scheduled Meds:   arformoterol   15 mcg Nebulization BID   budesonide   0.5 mg Nebulization BID   carvedilol   6.25 mg Oral BID WC   feeding supplement  237 mL Oral BID BM   heparin   5,000 Units Subcutaneous Q8H   losartan   50 mg Oral Daily   melatonin  5 mg Oral QHS   pantoprazole   40 mg Oral Daily   QUEtiapine   12.5 mg Oral QHS   revefenacin   175 mcg Nebulization Daily   senna-docusate  2 tablet Oral Daily   sodium chloride  flush  3 mL Intravenous Q12H   tamsulosin   0.4 mg Oral Daily    Continuous Infusions:   PRN Meds: acetaminophen  **OR** acetaminophen , haloperidol  lactate, hydrALAZINE , ipratropium-albuterol , ondansetron  **OR** ondansetron  (ZOFRAN ) IV, [EXPIRED] polyethylene glycol **FOLLOWED BY** polyethylene glycol, sodium chloride  flush  Physical Exam         Awakens when name is called No distress noted Appears with generalized weakness    Vital Signs: BP (!) 140/73 (BP Location: Left Arm)   Pulse 66   Temp 97.7 F (36.5 C) (Oral)   Resp 18   Ht 5' 8 (1.727 m)   Wt 64.3 kg   SpO2 93%   BMI 21.55 kg/m  SpO2: SpO2: 93 % O2 Device: O2 Device: Room Air O2 Flow Rate: O2 Flow Rate (L/min): 2 L/min  Intake/output summary:  Intake/Output Summary (Last 24 hours) at 09/02/2024 1221 Last data filed  at 09/02/2024 1000 Gross per 24 hour  Intake 240 ml  Output --  Net 240 ml   LBM: Last BM Date :  (UTA) Baseline Weight: Weight: 68.5 kg Most recent weight: Weight: 64.3 kg       Palliative Assessment/Data:      Patient Active Problem List   Diagnosis Date Noted   Delirium 08/30/2024   Cognitive  impairment 08/29/2024   DNR (do not resuscitate) 08/29/2024   Decreased oral intake 08/29/2024   Counseling and coordination of care 08/29/2024   Goals of care, counseling/discussion 08/29/2024   Generalized weakness 08/29/2024   Palliative care encounter 08/29/2024   Acute encephalopathy 08/19/2024   SIRS (systemic inflammatory response syndrome) (HCC) 08/19/2024   Left maxillary sinus opacification 08/19/2024   Vascular dementia without behavioral disturbance, psychotic disturbance, mood disturbance, or anxiety (HCC) 04/09/2023   Osteoarthritis of both hands 01/02/2023   Chronic cholecystitis 08/01/2022   Chronic combined systolic (congestive) and diastolic (congestive) heart failure (HCC) 08/01/2022   Gallstones 07/02/2022   NSVT (nonsustained ventricular tachycardia) (HCC)    Chronic heart failure with mildly reduced ejection fraction (HFmrEF) (HCC) 10/12/2021   PAF (paroxysmal atrial fibrillation) (HCC) 10/12/2021   Reflux esophagitis 09/28/2021   Neurodermatitis 09/15/2021   AKI (acute kidney injury) 07/25/2021   Allergic rhinitis due to pollen 06/12/2021   GERD (gastroesophageal reflux disease) 05/04/2021   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) 04/07/2021   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   HOH (hard of hearing) 12/28/2020   Pulmonary nodule 12/06/2020   Preventative health care 10/12/2020   Macular degeneration, wet (HCC) 10/12/2020   BPH (benign prostatic hyperplasia) 01/13/2020   Neuropathy 02/18/2018   COPD (chronic obstructive pulmonary disease) (HCC)    Benign essential HTN 04/09/2017   Atherosclerotic heart  disease of native coronary artery with angina pectoris 03/03/2015   Hyperlipidemia 03/03/2015    Palliative Care Assessment & Plan   Patient Profile: Delirium on possible underlying cognitive impairment Patient previously functional until recent psychosocial stressor (loss of family pet on 12/31). Extensive encephalopathy workup on 1/7 including MRI brain, B12, RPR, TSH, and ammonia was unrevealing. Delirium likely multifactorial, including medication effects (gabapentin  with reduced renal clearance), dehydration, and hospitalization. Despite interventions, patient remains significantly confused with concern that delirium may not fully resolve during this admission. Seroquel  12.5 mg nightly; melatonin 5 mg PO HS Continue reorientation strategies and delirium precautions Continue IV Haldol  as needed for agitation, as last resort.  Ongoing monitoring of mental status.   Goals of Care / Disposition Patient has DNR status. Given persistent delirium, recurrent hospitalizations, and declining functional status, prognosis and care needs were reviewed.   Generalized weakness / failure to thrive Recurrent hospitalizations with delirium and poor oral intake. Clinical picture concerning for advanced dementia with significant functional decline. Patient is appropriately DNR. Plan to get home with 24/7 caregivers with home health and [facility]. TOC note reviewed. SLP and PT notes also reviewed.    Code Status:    Code Status Orders  (From admission, onward)           Start     Ordered   08/27/24 1658  Do not attempt resuscitation (DNR)- Limited -Do Not Intubate (DNI)  Continuous       Question Answer Comment  If pulseless and not breathing No CPR or chest compressions.   In Pre-Arrest Conditions (Patient Is Breathing and Has A Pulse) Do not intubate. Provide all appropriate non-invasive medical interventions. Avoid ICU transfer unless indicated or required.   Consent: Discussion documented in EHR  or advanced directives reviewed      08/27/24 1700           Code Status History     Date Active Date Inactive Code Status Order ID Comments User Context   08/19/2024 0359 08/24/2024 2350 Limited: Do not attempt resuscitation (DNR) -DNR-LIMITED -Do Not Intubate/DNI  [id number]  Charlton Evalene RAMAN, MD ED   08/02/2023 1237 08/04/2023 1928 Limited: Do not attempt resuscitation (DNR) -DNR-LIMITED -Do Not Intubate/DNI  [id number]  Laurita Cort DASEN, MD ED   08/01/2022 2044 08/04/2022 2210 DNR [id number]  Tobie Jorie SAUNDERS, MD ED   10/10/2021 0318 10/17/2021 1650 DNR [id number]  Osa Kelly CROME, MD ED   10/10/2021 0110 10/10/2021 0318 DNR [id number]  Chotiner, Adine RAMAN, MD ED   07/24/2021 1019 07/29/2021 1940 DNR [id number]  Barbarann Nest, MD ED   04/19/2021 0010 04/30/2021 1903 DNR [id number]  Lonzell Emeline HERO, DO ED   12/29/2020 1001 01/23/2021 2001 DNR [id number]  Cheryle Page, MD Inpatient   12/27/2020 2202 12/29/2020 1001 Full Code [id number]  Tobie Jorie SAUNDERS, MD ED   11/26/2020 1947 11/29/2020 1648 Full Code [id number]  Shona Terry SAILOR, DO ED       Prognosis:  Unable to determine  Discharge Planning: Home with [facility] palliative.   Care plan was discussed with  IDT.   Thank you for allowing the Palliative Medicine Team to assist in the care of this patient. I personally spent a total of 35 minutes in the care of the patient today including preparing to see the patient, getting/reviewing separately obtained history, performing a medically appropriate exam/evaluation, counseling and educating, and coordinating care.      Greater than 50%  of this time was spent counseling and coordinating care related to the above assessment and plan.  Lonia Serve, MD  Please contact Palliative Medicine Team phone at [phone] for questions and concerns.       "

## 2024-09-02 NOTE — Telephone Encounter (Signed)
 Tried to call pt's wife but there was no answer. I cannot say yes or no without knowing more about the form. What does it pertain to or what information does it require?  Please provide message from provider/office when call is returned from patient.

## 2024-09-02 NOTE — Telephone Encounter (Signed)
 Copied from CRM #[id number]. Topic: Medical Record Request - Other >> Sep 01, 2024  3:49 PM Robinson DEL wrote: Reason for CRM: Patient wife states patient needs a form completed for Medicaid states patient will be discharged from the hospital tomorrow 1/21 but doesn't think he'll be able to come in to see provider since he's in pretty bad shape. Wants to know if she can bring form in to have completed.  Leonor Gunner [phone]

## 2024-09-03 ENCOUNTER — Telehealth: Payer: Self-pay

## 2024-09-03 NOTE — Transitions of Care (Post Inpatient/ED Visit) (Signed)
" ° °  09/03/2024  Name: Leon Estes MRN: [id number] DOB: 1930/09/06  Today's TOC FU Call Status: Today's TOC FU Call Status:: Unsuccessful Call (1st Attempt) Unsuccessful Call (1st Attempt) Date: 09/03/24  Attempted to reach the patient regarding the most recent Inpatient/ED visit.  Follow Up Plan: Additional outreach attempts will be made to reach the patient to complete the Transitions of Care (Post Inpatient/ED visit) call.   Jelani Vreeland J. Kaitlen Redford RN, MSN [facility], [facility] RN Care Manager Direct Dial: [phone]  Fax: [phone] Website: [email or web address]   "

## 2024-09-15 ENCOUNTER — Telehealth: Payer: Self-pay

## 2024-09-15 NOTE — Telephone Encounter (Signed)
 Copied from CRM [id number]. Topic: Clinical - Home Health Verbal Orders >> Sep 14, 2024  2:58 PM Alfonso ORN wrote: Caller/Agency: Nurse Powell with [facility] home health  Callback Number: [phone] (heather vcmail ok) Service Requested: pt admitted to [facility] for confusion . [facility] was to be primary care but wife is requesting [facility] be primary provider. Powell is requesting to resume orders for home care starting 09/18/24 Frequency: starting 02/26 Any new concerns about the patient? No

## 2024-09-16 NOTE — Telephone Encounter (Signed)
 Okay to approve verbal order as requested.

## 2024-09-17 ENCOUNTER — Telehealth: Payer: Self-pay

## 2024-09-17 NOTE — Telephone Encounter (Signed)
 Copied from CRM #[id number]. Topic: Medical Record Request - Other >> Sep 17, 2024  8:50 AM Willma SAUNDERS wrote: Reason for CRM: Powell from [facility] calling to request office notes from patients last visit on 04/09/24. Can be faxed to [phone].  Powell can be reached at [phone]

## 2024-09-17 NOTE — Telephone Encounter (Signed)
 Just happened to receive another call from [facility] this morning and the number was in the message twice. Powell can be reached at [phone]

## 2024-09-17 NOTE — Telephone Encounter (Signed)
 Unable to return call to [facility] as the phone number was not complete and the number was not put in the call Contact section, either. I tried [phone] since that is a [place] area code, but it was not correct.

## 2024-09-17 NOTE — Telephone Encounter (Signed)
 Spoke to [employer]. Gave her the verbal authorization that Dr Bennett will be attending physician.

## 2024-09-17 NOTE — Telephone Encounter (Signed)
 Faxed [facility] OV note from 04-09-24

## 2024-10-02 ENCOUNTER — Encounter
